# Patient Record
Sex: Female | Born: 1940 | Race: White | Hispanic: No
Health system: Southern US, Community
[De-identification: ages and names within clinical notes are randomized; demographics above are authoritative.]

## PROBLEM LIST (undated history)

## (undated) DIAGNOSIS — J984 Other disorders of lung: Secondary | ICD-10-CM

## (undated) DIAGNOSIS — Z77090 Contact with and (suspected) exposure to asbestos: Secondary | ICD-10-CM

## (undated) DIAGNOSIS — K7689 Other specified diseases of liver: Secondary | ICD-10-CM

## (undated) DIAGNOSIS — Z8679 Personal history of other diseases of the circulatory system: Secondary | ICD-10-CM

## (undated) DIAGNOSIS — K649 Unspecified hemorrhoids: Secondary | ICD-10-CM

## (undated) DIAGNOSIS — R569 Unspecified convulsions: Secondary | ICD-10-CM

## (undated) DIAGNOSIS — I700 Atherosclerosis of aorta: Secondary | ICD-10-CM

## (undated) DIAGNOSIS — I341 Nonrheumatic mitral (valve) prolapse: Secondary | ICD-10-CM

## (undated) DIAGNOSIS — T7840XA Allergy, unspecified, initial encounter: Secondary | ICD-10-CM

## (undated) DIAGNOSIS — I4510 Unspecified right bundle-branch block: Secondary | ICD-10-CM

## (undated) DIAGNOSIS — F418 Other specified anxiety disorders: Secondary | ICD-10-CM

## (undated) DIAGNOSIS — I4819 Other persistent atrial fibrillation: Secondary | ICD-10-CM

## (undated) DIAGNOSIS — K589 Irritable bowel syndrome without diarrhea: Secondary | ICD-10-CM

## (undated) DIAGNOSIS — Z9889 Other specified postprocedural states: Secondary | ICD-10-CM

## (undated) DIAGNOSIS — F419 Anxiety disorder, unspecified: Secondary | ICD-10-CM

## (undated) DIAGNOSIS — R911 Solitary pulmonary nodule: Secondary | ICD-10-CM

## (undated) DIAGNOSIS — D649 Anemia, unspecified: Secondary | ICD-10-CM

## (undated) DIAGNOSIS — D693 Immune thrombocytopenic purpura: Secondary | ICD-10-CM

## (undated) DIAGNOSIS — C4491 Basal cell carcinoma of skin, unspecified: Secondary | ICD-10-CM

## (undated) DIAGNOSIS — H269 Unspecified cataract: Secondary | ICD-10-CM

## (undated) DIAGNOSIS — Z953 Presence of xenogenic heart valve: Secondary | ICD-10-CM

## (undated) DIAGNOSIS — M1990 Unspecified osteoarthritis, unspecified site: Secondary | ICD-10-CM

## (undated) DIAGNOSIS — I340 Nonrheumatic mitral (valve) insufficiency: Secondary | ICD-10-CM

## (undated) DIAGNOSIS — I471 Supraventricular tachycardia, unspecified: Secondary | ICD-10-CM

## (undated) DIAGNOSIS — E785 Hyperlipidemia, unspecified: Secondary | ICD-10-CM

## (undated) DIAGNOSIS — I5033 Acute on chronic diastolic (congestive) heart failure: Secondary | ICD-10-CM

## (undated) DIAGNOSIS — M810 Age-related osteoporosis without current pathological fracture: Secondary | ICD-10-CM

## (undated) DIAGNOSIS — R011 Cardiac murmur, unspecified: Secondary | ICD-10-CM

## (undated) DIAGNOSIS — K5790 Diverticulosis of intestine, part unspecified, without perforation or abscess without bleeding: Secondary | ICD-10-CM

## (undated) DIAGNOSIS — G43909 Migraine, unspecified, not intractable, without status migrainosus: Secondary | ICD-10-CM

## (undated) DIAGNOSIS — G459 Transient cerebral ischemic attack, unspecified: Secondary | ICD-10-CM

## (undated) DIAGNOSIS — R001 Bradycardia, unspecified: Secondary | ICD-10-CM

## (undated) HISTORY — DX: Hyperlipidemia, unspecified: E78.5

## (undated) HISTORY — DX: Other disorders of lung: J98.4

## (undated) HISTORY — DX: Contact with and (suspected) exposure to asbestos: Z77.090

## (undated) HISTORY — PX: TEE WITH CARDIOVERSION: SHX5442

## (undated) HISTORY — DX: Unspecified right bundle-branch block: I45.10

## (undated) HISTORY — DX: Atherosclerosis of aorta: I70.0

## (undated) HISTORY — DX: Other persistent atrial fibrillation: I48.19

## (undated) HISTORY — PX: FOOT SURGERY: SHX648

## (undated) HISTORY — DX: Irritable bowel syndrome, unspecified: K58.9

## (undated) HISTORY — DX: Solitary pulmonary nodule: R91.1

## (undated) HISTORY — DX: Supraventricular tachycardia, unspecified: I47.10

## (undated) HISTORY — DX: Immune thrombocytopenic purpura: D69.3

## (undated) HISTORY — DX: Unspecified cataract: H26.9

## (undated) HISTORY — PX: BREAST EXCISIONAL BIOPSY: SUR124

## (undated) HISTORY — PX: VARICOSE VEIN SURGERY: SHX832

## (undated) HISTORY — DX: Nonrheumatic mitral (valve) prolapse: I34.1

## (undated) HISTORY — DX: Unspecified convulsions: R56.9

## (undated) HISTORY — DX: Acute on chronic diastolic (congestive) heart failure: I50.33

## (undated) HISTORY — DX: Diverticulosis of intestine, part unspecified, without perforation or abscess without bleeding: K57.90

## (undated) HISTORY — PX: TUBAL LIGATION: SHX77

## (undated) HISTORY — PX: BREAST BIOPSY: SHX20

## (undated) HISTORY — DX: Other specified anxiety disorders: F41.8

## (undated) HISTORY — DX: Other specified diseases of liver: K76.89

## (undated) HISTORY — DX: Unspecified osteoarthritis, unspecified site: M19.90

## (undated) HISTORY — DX: Migraine, unspecified, not intractable, without status migrainosus: G43.909

## (undated) HISTORY — DX: Cardiac murmur, unspecified: R01.1

## (undated) HISTORY — DX: Anemia, unspecified: D64.9

## (undated) HISTORY — DX: Supraventricular tachycardia: I47.1

## (undated) HISTORY — DX: Allergy, unspecified, initial encounter: T78.40XA

## (undated) HISTORY — DX: Transient cerebral ischemic attack, unspecified: G45.9

## (undated) HISTORY — DX: Unspecified hemorrhoids: K64.9

## (undated) HISTORY — DX: Anxiety disorder, unspecified: F41.9

---

## 1989-12-02 DIAGNOSIS — C4491 Basal cell carcinoma of skin, unspecified: Secondary | ICD-10-CM

## 1989-12-02 HISTORY — DX: Basal cell carcinoma of skin, unspecified: C44.91

## 2001-12-02 HISTORY — PX: COLONOSCOPY: SHX174

## 2002-06-25 LAB — HM COLONOSCOPY: HM Colonoscopy: NORMAL

## 2007-06-04 ENCOUNTER — Ambulatory Visit: Payer: Self-pay | Admitting: Internal Medicine

## 2008-06-28 ENCOUNTER — Ambulatory Visit: Payer: Self-pay | Admitting: Internal Medicine

## 2009-06-29 ENCOUNTER — Ambulatory Visit: Payer: Self-pay | Admitting: Internal Medicine

## 2010-07-11 ENCOUNTER — Ambulatory Visit: Payer: Self-pay | Admitting: Internal Medicine

## 2010-07-28 LAB — HM DEXA SCAN

## 2011-02-24 LAB — HM PAP SMEAR: HM Pap smear: NORMAL

## 2011-02-27 ENCOUNTER — Encounter: Payer: Self-pay | Admitting: Internal Medicine

## 2011-02-28 LAB — IFOBT (OCCULT BLOOD): IFOBT: NEGATIVE

## 2011-03-05 ENCOUNTER — Ambulatory Visit: Payer: Self-pay | Admitting: Internal Medicine

## 2011-05-01 ENCOUNTER — Encounter: Payer: Self-pay | Admitting: Cardiovascular Disease

## 2011-05-02 ENCOUNTER — Ambulatory Visit (INDEPENDENT_AMBULATORY_CARE_PROVIDER_SITE_OTHER): Payer: Medicare Other | Admitting: Cardiovascular Disease

## 2011-05-02 ENCOUNTER — Encounter: Payer: Self-pay | Admitting: Cardiovascular Disease

## 2011-05-02 DIAGNOSIS — I498 Other specified cardiac arrhythmias: Secondary | ICD-10-CM

## 2011-05-02 DIAGNOSIS — I059 Rheumatic mitral valve disease, unspecified: Secondary | ICD-10-CM

## 2011-05-02 DIAGNOSIS — E785 Hyperlipidemia, unspecified: Secondary | ICD-10-CM

## 2011-05-02 DIAGNOSIS — I341 Nonrheumatic mitral (valve) prolapse: Secondary | ICD-10-CM | POA: Insufficient documentation

## 2011-05-02 DIAGNOSIS — I471 Supraventricular tachycardia: Secondary | ICD-10-CM | POA: Insufficient documentation

## 2011-05-02 DIAGNOSIS — R002 Palpitations: Secondary | ICD-10-CM | POA: Insufficient documentation

## 2011-05-02 LAB — BASIC METABOLIC PANEL
BUN: 22 mg/dL (ref 6–23)
CO2: 30 mEq/L (ref 19–32)
Calcium: 9.4 mg/dL (ref 8.4–10.5)
Chloride: 104 mEq/L (ref 96–112)
Creatinine, Ser: 0.7 mg/dL (ref 0.4–1.2)
GFR: 89.26 mL/min (ref >60.00)
Glucose, Bld: 99 mg/dL (ref 70–99)
Potassium: 3.8 mEq/L (ref 3.5–5.1)
Sodium: 140 mEq/L (ref 135–145)

## 2011-05-02 LAB — HEPATIC FUNCTION PANEL
ALT: 18 U/L (ref 0–35)
AST: 25 U/L (ref 0–37)
Albumin: 4.2 g/dL (ref 3.5–5.2)
Alkaline Phosphatase: 65 U/L (ref 39–117)
Bilirubin, Direct: 0.1 mg/dL (ref 0.0–0.3)
Total Bilirubin: 0.7 mg/dL (ref 0.3–1.2)
Total Protein: 6.5 g/dL (ref 6.0–8.3)

## 2011-05-02 LAB — LIPID PANEL
Cholesterol: 166 mg/dL (ref 0–200)
HDL: 60 mg/dL (ref >39.00)
LDL Cholesterol: 93 mg/dL (ref 0–99)
Total CHOL/HDL Ratio: 3
Triglycerides: 63 mg/dL (ref 0.0–149.0)
VLDL: 12.6 mg/dL (ref 0.0–40.0)

## 2011-05-02 LAB — TSH: TSH: 2.5 u[IU]/mL (ref 0.35–5.50)

## 2011-05-02 MED ORDER — ROSUVASTATIN CALCIUM 5 MG PO TABS: 5.0000 mg | ORAL_TABLET | ORAL | Status: DC

## 2011-05-02 NOTE — Assessment & Plan Note (Signed)
[name] [name] has been having some more prominent heartbeats recently. She does not think that they're do to supraventricular tachycardia but thinks that she may be having another kind of heart irregularity. We'll check a TSH today as well as a lipid profile, basic metabolic profile, and hepatic profile.  I'll see her back in 3 months for followup visit.

## 2011-05-02 NOTE — Progress Notes (Signed)
[name] Date of Birth  [birth year] [facility] [address]     [address] [place], [place]  [address] [phone]  Fax  [phone]  History of Present Illness:  70 yo female with a hx of SVT, MVP and hyperlipidemia.    No major complaints.  Not having as much energy as she would like.  Working out 3 times a week.  Starting having many more palpitations.  These episodes would last hours.  TSH was checked a year ago.  Current Outpatient Prescriptions on File Prior to Visit  Medication Sig Dispense Refill  . b complex vitamins tablet Take 1 tablet by mouth daily.        . butalbital-acetaminophen-caffeine (FIORICET, ESGIC) 50-325-40 MG per tablet Take 1 tablet by mouth 2 (two) times daily as needed.        . montelukast (SINGULAIR) 10 MG tablet Take 10 mg by mouth at bedtime.        . Multiple Vitamin (MULTIVITAMIN) tablet Take 1 tablet by mouth daily.        . rosuvastatin (CRESTOR) 5 MG tablet Take 5 mg by mouth every other day.        Marland Kitchen DISCONTD: Docusate Calcium (STOOL SOFTENER PO) Take by mouth daily.          Allergies  Allergen Reactions  . Codeine   . Doxycycline   . Hydrocodone   . Mobic   . Oxycodone     Past Medical History  Diagnosis Date  . SVT (supraventricular tachycardia)   . MVP (mitral valve prolapse)   . Hyperlipidemia     Past Surgical History  Procedure Date  . Tubal ligation   . Foot surgery   . Varicose vein surgery     History  Smoking status  . Never Smoker   Smokeless tobacco  . Not on file    History  Alcohol Use No    Family History  Problem Relation Age of Onset  . Hypertension Mother   . Arrhythmia Mother   . Heart failure Mother   . Arrhythmia Brother     Reviw of Systems:  Reviewed in the HPI.  All other systems are negative.  Physical Exam: BP 110/68  Pulse 60  Ht 5\' 6"  (1.676 m)  Wt 148 lb 9.6 oz (67.405 kg)  BMI 23.98 kg/m2 The patient is alert and oriented x 3.   The mood and affect are normal.  The skin is warm and dry.  Color is normal.  The HEENT exam reveals that the sclera are nonicteric.  The mucous membranes are moist.  The carotids are 2+ without bruits.  There is no thyromegaly.  There is no JVD.  The lungs are clear.  The chest wall is non tender.  The heart exam reveals a regular rate with a normal S1 and S2.  There are no murmurs, gallops, or rubs.  The PMI is not displaced.   Abdominal exam reveals good bowel sounds.  There is no guarding or rebound.  There is no hepatosplenomegaly or tenderness.  There are no masses.  Exam of the legs reveal no clubbing, cyanosis, or edema.  The legs are without rashes.  The distal pulses are intact.  Cranial nerves II - XII are intact.  Motor and sensory functions are intact.  The gait is normal.  ECG: NSR.  RBBB  Assessment / Plan:

## 2011-05-06 NOTE — Progress Notes (Signed)
Called msg of normal results

## 2011-08-06 ENCOUNTER — Ambulatory Visit (INDEPENDENT_AMBULATORY_CARE_PROVIDER_SITE_OTHER): Payer: Medicare Other | Admitting: Cardiovascular Disease

## 2011-08-06 ENCOUNTER — Encounter: Payer: Self-pay | Admitting: Cardiovascular Disease

## 2011-08-06 VITALS — BP 122/70 | HR 60 | Ht 66.0 in | Wt 148.2 lb

## 2011-08-06 DIAGNOSIS — I341 Nonrheumatic mitral (valve) prolapse: Secondary | ICD-10-CM

## 2011-08-06 DIAGNOSIS — I498 Other specified cardiac arrhythmias: Secondary | ICD-10-CM

## 2011-08-06 DIAGNOSIS — I471 Supraventricular tachycardia: Secondary | ICD-10-CM

## 2011-08-06 DIAGNOSIS — R002 Palpitations: Secondary | ICD-10-CM

## 2011-08-06 DIAGNOSIS — E785 Hyperlipidemia, unspecified: Secondary | ICD-10-CM | POA: Insufficient documentation

## 2011-08-06 DIAGNOSIS — I059 Rheumatic mitral valve disease, unspecified: Secondary | ICD-10-CM

## 2011-08-06 NOTE — Assessment & Plan Note (Signed)
No episodes of SVT.  Continue with current meds.

## 2011-08-06 NOTE — Assessment & Plan Note (Signed)
Her initial LDL was 142.  Her lipids are now well controlled.  Continue current meds.

## 2011-08-06 NOTE — Progress Notes (Signed)
[name] [name] Date of Birth  January 07, 1941 [facility] [address]     [address] [place], [place]  [address] [phone]  Fax  [phone]  History of Present Illness:  70 year old female with a history of supraventricular tachycardia and history of much by prolapse. She has done quite well since I last saw her. She took a trip to [place] and was very dizzy. She did a lot of hiking and never had any episodes of chest pain or shortness of breath.  Current Outpatient Prescriptions on File Prior to Visit  Medication Sig Dispense Refill  . b complex vitamins tablet Take 1 tablet by mouth daily.        . butalbital-acetaminophen-caffeine (FIORICET, ESGIC) 50-325-40 MG per tablet Take 1 tablet by mouth 2 (two) times daily as needed. Takes prn      . CALCIUM PO Take 1,000 mg by mouth daily.        . Cholecalciferol (VITAMIN D) 1000 UNITS capsule Take 1,000 Units by mouth daily.        . montelukast (SINGULAIR) 10 MG tablet Take 10 mg by mouth at bedtime.        . Multiple Vitamin (MULTIVITAMIN) tablet Take 1 tablet by mouth daily.        . rosuvastatin (CRESTOR) 5 MG tablet Take 1 tablet (5 mg total) by mouth every other day.  90 tablet  3  . vitamin C (ASCORBIC ACID) 500 MG tablet Take 500 mg by mouth daily.        . vitamin E 100 UNIT capsule Take 400 Units by mouth daily.          Allergies  Allergen Reactions  . Codeine   . Doxycycline   . Hydrocodone   . Mobic   . Oxycodone     Past Medical History  Diagnosis Date  . SVT (supraventricular tachycardia)   . MVP (mitral valve prolapse)   . Hyperlipidemia     Past Surgical History  Procedure Date  . Tubal ligation   . Foot surgery   . Varicose vein surgery     History  Smoking status  . Never Smoker   Smokeless tobacco  . Not on file    History  Alcohol Use No    Family History  Problem Relation Age of Onset  . Hypertension Mother   . Arrhythmia Mother   . Heart failure  Mother   . Arrhythmia Brother     Reviw of Systems:  Reviewed in the HPI.  All other systems are negative.  Physical Exam: BP 122/70  Pulse 60  Ht 5\' 6"  (1.676 m)  Wt 148 lb 3.2 oz (67.223 kg)  BMI 23.92 kg/m2 The patient is alert and oriented x 3.  The mood and affect are normal.   Skin: warm and dry.  Color is normal.    HEENT:   the sclera are nonicteric.  The mucous membranes are moist.  The carotids are 2+ without bruits.  There is no thyromegaly.  There is no JVD.    Lungs: clear.  The chest wall is non tender.    Heart: regular rate with a normal S1 and S2.  There is a mid systolic click. The PMI is not displaced.     Abdomen: good bowel sounds.  There is no guarding or rebound.  There is no hepatosplenomegaly or tenderness.  There are no masses.   Extremities:  no clubbing, cyanosis, or edema.  The  legs are without rashes.  The distal pulses are intact.   Neuro:  Cranial nerves II - XII are intact.  Motor and sensory functions are intact.    The gait is normal.  Assessment / Plan:

## 2011-08-06 NOTE — Assessment & Plan Note (Signed)
Stable.  She remains asymptomatic.

## 2011-08-06 NOTE — Assessment & Plan Note (Signed)
Stable

## 2011-08-23 ENCOUNTER — Telehealth: Payer: Self-pay | Admitting: Internal Medicine

## 2011-09-05 ENCOUNTER — Other Ambulatory Visit: Payer: Self-pay | Admitting: Internal Medicine

## 2011-09-05 DIAGNOSIS — R51 Headache: Secondary | ICD-10-CM

## 2011-09-06 MED ORDER — BUTALBITAL-APAP-CAFFEINE 50-325-40 MG PO TABS: 1.0000 | ORAL_TABLET | Freq: Two times a day (BID) | ORAL | Status: DC | PRN

## 2011-09-06 NOTE — Progress Notes (Signed)
Addended by: Duncan Dull on: 09/06/2011 05:28 PM   Modules accepted: Orders

## 2011-09-07 ENCOUNTER — Other Ambulatory Visit: Payer: Self-pay | Admitting: Internal Medicine

## 2011-10-01 ENCOUNTER — Encounter: Payer: Self-pay | Admitting: Internal Medicine

## 2011-10-01 ENCOUNTER — Ambulatory Visit (INDEPENDENT_AMBULATORY_CARE_PROVIDER_SITE_OTHER): Payer: Medicare Other | Admitting: Internal Medicine

## 2011-10-01 DIAGNOSIS — R059 Cough, unspecified: Secondary | ICD-10-CM

## 2011-10-01 DIAGNOSIS — J040 Acute laryngitis: Secondary | ICD-10-CM

## 2011-10-01 DIAGNOSIS — R05 Cough: Secondary | ICD-10-CM

## 2011-10-01 MED ORDER — PREDNISONE (PAK) 10 MG PO TABS: ORAL_TABLET | ORAL | Status: AC

## 2011-10-01 MED ORDER — BENZONATATE 100 MG PO CAPS: 100.0000 mg | ORAL_CAPSULE | Freq: Four times a day (QID) | ORAL | Status: DC | PRN

## 2011-10-01 MED ORDER — AZITHROMYCIN 500 MG PO TABS: 500.0000 mg | ORAL_TABLET | Freq: Every day | ORAL | Status: AC

## 2011-10-01 NOTE — Progress Notes (Signed)
Subjective:    Patient ID: [name] [name], female    DOB: [birth year], 70 y.o.   MRN: [id number]  HPI  70 yo femalewith historyof chronic headaches, SVT and mitral valve prolapse  presents with laryngitisof 2 days duration  which followed an URI which she contracted after caring for her daughter who had pneumonia and bronchitis. She had several days of cough productive of clear occsaional green sputum, headache and rhinitis with post nasal drip but no but no fevers.  She is reqesting treatment is she has a vacation to [place] planned at the end of the week.  Has been taking otc mucinex and sudafed pe for the congestion.   Past Medical History  Diagnosis Date  . SVT (supraventricular tachycardia)   . MVP (mitral valve prolapse)   . Hyperlipidemia    Current Outpatient Prescriptions on File Prior to Visit  Medication Sig Dispense Refill  . aspirin-acetaminophen-caffeine (EXCEDRIN MIGRAINE) 250-250-65 MG per tablet Take 1 tablet by mouth every 6 (six) hours as needed.        Marland Kitchen b complex vitamins tablet Take 1 tablet by mouth daily.        . butalbital-acetaminophen-caffeine (FIORICET, ESGIC) 50-325-40 MG per tablet TAKE 1 TABLET AS NEEDED FOR BACK PAIN OR HEADACHE  90 tablet  2  . CALCIUM PO Take 1,000 mg by mouth daily.        . Cholecalciferol (VITAMIN D) 1000 UNITS capsule Take 1,000 Units by mouth daily.        . Flaxseed, Linseed, (FLAX SEED OIL) 1000 MG CAPS Take by mouth daily.        . Ibuprofen (MOTRIN PO) Take by mouth as needed.        . montelukast (SINGULAIR) 10 MG tablet Take 10 mg by mouth at bedtime.        . Multiple Vitamin (MULTIVITAMIN) tablet Take 1 tablet by mouth daily.        . Omega-3 Fatty Acids (FISH OIL PO) Take by mouth daily.        . Probiotic Product (PROBIOTIC PO) Take by mouth daily.        . rosuvastatin (CRESTOR) 5 MG tablet Take 1 tablet (5 mg total) by mouth every other day.  90 tablet  3  . vitamin C (ASCORBIC ACID) 500 MG tablet Take 500 mg by mouth  daily.        . vitamin E 100 UNIT capsule Take 400 Units by mouth daily.          Review of Systems  Constitutional: Negative for fever, chills and unexpected weight change.  HENT: Positive for congestion, rhinorrhea, voice change, postnasal drip and sinus pressure. Negative for hearing loss, ear pain, nosebleeds, sore throat, facial swelling, sneezing, mouth sores, trouble swallowing, neck pain, neck stiffness, tinnitus and ear discharge.   Eyes: Negative for pain, discharge, redness and visual disturbance.  Respiratory: Positive for cough. Negative for chest tightness, shortness of breath, wheezing and stridor.   Cardiovascular: Negative for chest pain, palpitations and leg swelling.  Musculoskeletal: Negative for myalgias and arthralgias.  Skin: Negative for color change and rash.  Neurological: Negative for dizziness, weakness, light-headedness and headaches.  Hematological: Negative for adenopathy.      BP 130/76  Pulse 68  Temp(Src) 97.8 F (36.6 C) (Oral)  Resp 16  Ht 5' 6.5" (1.689 m)  Wt 146 lb (66.225 kg)  BMI 23.21 kg/m2  SpO2 99%  Objective:   Physical Exam  Constitutional: She is oriented  to person, place, and time. She appears well-developed and well-nourished.  HENT:  Right Ear: Tympanic membrane is scarred.  Left Ear: Tympanic membrane is scarred.  Nose: Mucosal edema present.  Mouth/Throat: Mucous membranes are normal. No uvula swelling. Posterior oropharyngeal erythema present. No oropharyngeal exudate or tonsillar abscesses.  Eyes: EOM are normal. Pupils are equal, round, and reactive to light. No scleral icterus.  Neck: Normal range of motion. Neck supple. No JVD present. No thyromegaly present.  Cardiovascular: Normal rate, regular rhythm, normal heart sounds and intact distal pulses.   Pulmonary/Chest: Effort normal and breath sounds normal.  Abdominal: Soft. Bowel sounds are normal. She exhibits no mass. There is no tenderness.  Musculoskeletal: Normal  range of motion. She exhibits no edema.  Lymphadenopathy:    She has no cervical adenopathy.  Neurological: She is alert and oriented to person, place, and time.  Skin: Skin is warm and dry.  Psychiatric: She has a normal mood and affect.          Assessment & Plan:  Laryngitis:  secodnaty to recent URI .  Voice rest,  Cough suppressant ,  Prednisone.  Add azithromycin if sinusitis or otitis symptoms develop while away.

## 2011-10-01 NOTE — Patient Instructions (Signed)
The  best cure for laryngitis  Is voice rest, lots of fluids, and cough suppression.  Take the tessalon every 6 hours for cough,  The prednisone 6 day taper to decrease the inflammation,    And add the azithromycin if you develop a temperature over 100.5 or signs of a sinus infection (greenish or brown nasal discharge or cough )

## 2011-10-02 ENCOUNTER — Encounter: Payer: Self-pay | Admitting: Internal Medicine

## 2011-11-26 LAB — HM MAMMOGRAPHY: HM Mammogram: NORMAL

## 2011-12-02 ENCOUNTER — Ambulatory Visit: Payer: Self-pay | Admitting: Internal Medicine

## 2011-12-03 HISTORY — PX: EYE SURGERY: SHX253

## 2011-12-18 ENCOUNTER — Encounter: Payer: Self-pay | Admitting: Internal Medicine

## 2012-01-31 NOTE — Telephone Encounter (Signed)
This was

## 2012-05-04 ENCOUNTER — Telehealth: Payer: Self-pay | Admitting: Cardiovascular Disease

## 2012-05-04 NOTE — Telephone Encounter (Signed)
Pt needs appt with Dr. Elease Hashimoto for pre op eval. Thanks surg is not until july

## 2012-05-04 NOTE — Telephone Encounter (Signed)
New problem:  Patient calling having eye surgery @ [facility]. 7-9-. Pre -op June 26. Need cardiac clearance.

## 2012-05-04 NOTE — Telephone Encounter (Signed)
FORWARD TO [facility] OFFICE

## 2012-05-05 ENCOUNTER — Encounter: Payer: Self-pay | Admitting: Cardiovascular Disease

## 2012-05-05 ENCOUNTER — Ambulatory Visit (INDEPENDENT_AMBULATORY_CARE_PROVIDER_SITE_OTHER): Payer: Medicare Other | Admitting: Cardiovascular Disease

## 2012-05-05 VITALS — BP 120/70 | HR 59 | Ht 66.5 in | Wt 146.5 lb

## 2012-05-05 DIAGNOSIS — I4510 Unspecified right bundle-branch block: Secondary | ICD-10-CM | POA: Insufficient documentation

## 2012-05-05 DIAGNOSIS — I498 Other specified cardiac arrhythmias: Secondary | ICD-10-CM

## 2012-05-05 DIAGNOSIS — R002 Palpitations: Secondary | ICD-10-CM

## 2012-05-05 DIAGNOSIS — I471 Supraventricular tachycardia: Secondary | ICD-10-CM

## 2012-05-05 DIAGNOSIS — I341 Nonrheumatic mitral (valve) prolapse: Secondary | ICD-10-CM

## 2012-05-05 DIAGNOSIS — I059 Rheumatic mitral valve disease, unspecified: Secondary | ICD-10-CM

## 2012-05-05 DIAGNOSIS — E785 Hyperlipidemia, unspecified: Secondary | ICD-10-CM

## 2012-05-05 NOTE — Progress Notes (Signed)
[name] [name] Date of Birth  August 11, 1941       [facility] Office [address]  [address], [address] [place], [place]  [address]   [place], [place]  [address] [phone]     [phone]   Fax  [phone]    Fax [phone]  Problem List: 1. Supraventricular tachycardia 2. Mitral valve 3. Cataracts  History of Present Illness:  [name] [name] is a  71 yo with hx of SVT and MVP.  She needs to have cataract surgery and is hear for pre op evaluation.  She has done well.  She has not been exercising as much as she would like.  She has not had any syncope or presyncope.  She has been taking care of her granddaughter who has ADD and a sleep disorder.  [name] [name] did not get much sleep that weekend and had lots of palpitations for the following week  Current Outpatient Prescriptions on File Prior to Visit  Medication Sig Dispense Refill  . aspirin-acetaminophen-caffeine (EXCEDRIN MIGRAINE) 250-250-65 MG per tablet Take 1 tablet by mouth every 6 (six) hours as needed.        Marland Kitchen b complex vitamins tablet Take 1 tablet by mouth daily.        . benzonatate (TESSALON PERLES) 100 MG capsule Take 1 capsule (100 mg total) by mouth every 6 (six) hours as needed for cough.  60 capsule  0  . butalbital-acetaminophen-caffeine (FIORICET, ESGIC) 50-325-40 MG per tablet TAKE 1 TABLET AS NEEDED FOR BACK PAIN OR HEADACHE  90 tablet  2  . CALCIUM PO Take 1,000 mg by mouth daily.        . Cholecalciferol (VITAMIN D) 1000 UNITS capsule Take 1,000 Units by mouth daily.        . Flaxseed, Linseed, (FLAX SEED OIL) 1000 MG CAPS Take by mouth daily.        . Ibuprofen (MOTRIN PO) Take by mouth as needed.        . montelukast (SINGULAIR) 10 MG tablet Take 10 mg by mouth at bedtime.        . Multiple Vitamin (MULTIVITAMIN) tablet Take 1 tablet by mouth daily.        . Omega-3 Fatty Acids (FISH OIL PO) Take by mouth daily.        . Probiotic Product (PROBIOTIC PO) Take by mouth  daily.        . rosuvastatin (CRESTOR) 5 MG tablet Take 1 tablet (5 mg total) by mouth every other day.  90 tablet  3  . vitamin C (ASCORBIC ACID) 500 MG tablet Take 500 mg by mouth daily.        . vitamin E 100 UNIT capsule Take 400 Units by mouth daily.          Allergies  Allergen Reactions  . Codeine   . Doxycycline   . Hydrocodone   . Meloxicam   . Oxycodone     Past Medical History  Diagnosis Date  . SVT (supraventricular tachycardia)   . MVP (mitral valve prolapse)   . Hyperlipidemia     Past Surgical History  Procedure Date  . Tubal ligation   . Foot surgery   . Varicose vein surgery     History  Smoking status  . Never Smoker   Smokeless tobacco  . Never Used    History  Alcohol Use  . 1.5 oz/week  . 3 drink(s) per week  Family History  Problem Relation Age of Onset  . Hypertension Mother   . Arrhythmia Mother   . Heart failure Mother   . Arrhythmia Brother     Reviw of Systems:  Reviewed in the HPI.  All other systems are negative.  Physical Exam: Blood pressure 120/70, pulse 59, height 5' 6.5" (1.689 m), weight 146 lb 8 oz (66.452 kg). General: Well developed, well nourished, in no acute distress.  Head: Normocephalic, atraumatic, sclera non-icteric, mucus membranes are moist,   Neck: Supple. Carotids are 2 + without bruits. No JVD  Lungs: Clear bilaterally to auscultation.  Heart: regular rate.  normal  S1 S2. She has a midsystolic click. There is a 2/6 systolic ejection murmur at the axilla.  Abdomen: Soft, non-tender, non-distended with normal bowel sounds. No hepatomegaly. No rebound/guarding. No masses.  Msk:  Strength and tone are normal  Extremities: No clubbing or cyanosis. No edema.  Distal pedal pulses are 2+ and equal bilaterally.  Neuro: Alert and oriented X 3. Moves all extremities spontaneously.  Psych:  Responds to questions appropriately with a normal affect.  ECG: 05/05/2012 --sinus bradycardia at 59 beats a  minute. She has a right bundle branch block. EKG is unchanged from her previous tracings.  Assessment / Plan:

## 2012-05-05 NOTE — Assessment & Plan Note (Signed)
[name] [name] is doing very well. She has a stable mitral valve prolapse. She does not have any other symptoms. Will see her again in one year for followup visit and EKG.

## 2012-05-05 NOTE — Assessment & Plan Note (Signed)
We will have her return tomorrow for fasting labs.

## 2012-05-05 NOTE — Patient Instructions (Signed)
Your physician wants you to follow-up in: 1 year with Dr. Elease Hashimoto. You will receive a reminder letter in the mail two months in advance. If you don't receive a letter, please call our office to schedule the follow-up appointment.  Your physician recommends that you return for lab work tomorrow.  Make sure you are fasting.

## 2012-05-06 ENCOUNTER — Other Ambulatory Visit (INDEPENDENT_AMBULATORY_CARE_PROVIDER_SITE_OTHER): Payer: Medicare Other

## 2012-05-06 DIAGNOSIS — R002 Palpitations: Secondary | ICD-10-CM

## 2012-05-06 DIAGNOSIS — E785 Hyperlipidemia, unspecified: Secondary | ICD-10-CM

## 2012-05-06 DIAGNOSIS — I471 Supraventricular tachycardia: Secondary | ICD-10-CM

## 2012-05-07 LAB — BASIC METABOLIC PANEL
BUN/Creatinine Ratio: 25 (ref 11–26)
BUN: 18 mg/dL (ref 8–27)
CO2: 26 mmol/L (ref 20–32)
Calcium: 9.1 mg/dL (ref 8.6–10.2)
Chloride: 105 mmol/L (ref 97–108)
Creatinine, Ser: 0.73 mg/dL (ref 0.57–1.00)
GFR calc Af Amer: 96 mL/min/{1.73_m2} (ref >59)
GFR calc non Af Amer: 83 mL/min/{1.73_m2} (ref >59)
Glucose: 92 mg/dL (ref 65–99)
Potassium: 4.8 mmol/L (ref 3.5–5.2)
Sodium: 141 mmol/L (ref 134–144)

## 2012-05-07 LAB — HEPATIC FUNCTION PANEL
ALT: 19 IU/L (ref 0–32)
AST: 25 IU/L (ref 0–40)
Albumin: 4.2 g/dL (ref 3.5–4.8)
Alkaline Phosphatase: 72 IU/L (ref 25–165)
Bilirubin, Direct: 0.13 mg/dL (ref 0.00–0.40)
Total Bilirubin: 0.5 mg/dL (ref 0.0–1.2)
Total Protein: 6.2 g/dL (ref 6.0–8.5)

## 2012-05-07 LAB — CHOLESTEROL, TOTAL: Cholesterol, Total: 176 mg/dL (ref 100–199)

## 2012-05-11 ENCOUNTER — Telehealth: Payer: Self-pay | Admitting: Cardiovascular Disease

## 2012-05-11 NOTE — Telephone Encounter (Signed)
Patient returning nurse call concerning results.  Patient has question about lab results, she can be reached at [phone].

## 2012-05-11 NOTE — Telephone Encounter (Signed)
She states she would like to get the labs done at [facility] next Tuesday, 6/10.

## 2012-05-11 NOTE — Telephone Encounter (Signed)
Pt is concerned because all she was given at the [facility] office is her total cholesterol.  She wants to know if she can have it rechecked with the LDL, triglycerides and the HDL along with the total.  She would like to have it reordered with these results.

## 2012-05-14 ENCOUNTER — Other Ambulatory Visit: Payer: Self-pay

## 2012-05-14 DIAGNOSIS — E785 Hyperlipidemia, unspecified: Secondary | ICD-10-CM

## 2012-05-14 NOTE — Telephone Encounter (Signed)
Pt called back. Scheduled for L/L at [facility] office Tues 6/18 at 0830

## 2012-05-14 NOTE — Telephone Encounter (Signed)
LMTCB

## 2012-05-14 NOTE — Telephone Encounter (Signed)
Yes, she should have the whole lipid profile.

## 2012-05-14 NOTE — Telephone Encounter (Signed)
Will forward to [place] office to order and draw lab per Dr Elease Hashimoto

## 2012-05-19 ENCOUNTER — Ambulatory Visit (INDEPENDENT_AMBULATORY_CARE_PROVIDER_SITE_OTHER): Payer: Medicare Other

## 2012-05-19 DIAGNOSIS — E785 Hyperlipidemia, unspecified: Secondary | ICD-10-CM

## 2012-05-20 LAB — BASIC METABOLIC PANEL
BUN/Creatinine Ratio: 32 — ABNORMAL HIGH (ref 11–26)
BUN: 25 mg/dL (ref 8–27)
CO2: 25 mmol/L (ref 19–28)
Calcium: 9 mg/dL (ref 8.6–10.2)
Chloride: 101 mmol/L (ref 97–108)
Creatinine, Ser: 0.79 mg/dL (ref 0.57–1.00)
GFR calc Af Amer: 87 mL/min/{1.73_m2} (ref >59)
GFR calc non Af Amer: 76 mL/min/{1.73_m2} (ref >59)
Glucose: 88 mg/dL (ref 65–99)
Potassium: 5 mmol/L (ref 3.5–5.2)
Sodium: 137 mmol/L (ref 134–144)

## 2012-05-20 LAB — LIPID PANEL
Chol/HDL Ratio: 2.6 ratio units (ref 0.0–4.4)
Cholesterol, Total: 171 mg/dL (ref 100–199)
HDL: 66 mg/dL (ref >39)
LDL Calculated: 94 mg/dL (ref 0–99)
Triglycerides: 56 mg/dL (ref 0–149)
VLDL Cholesterol Cal: 11 mg/dL (ref 5–40)

## 2012-05-20 LAB — HEPATIC FUNCTION PANEL
ALT: 18 IU/L (ref 0–32)
AST: 24 IU/L (ref 0–40)
Albumin: 3.9 g/dL (ref 3.5–4.8)
Alkaline Phosphatase: 67 IU/L (ref 25–165)
Bilirubin, Direct: 0.16 mg/dL (ref 0.00–0.40)
Total Bilirubin: 0.5 mg/dL (ref 0.0–1.2)
Total Protein: 5.9 g/dL — ABNORMAL LOW (ref 6.0–8.5)

## 2012-06-09 ENCOUNTER — Ambulatory Visit: Payer: Self-pay | Admitting: Ophthalmology

## 2012-06-25 ENCOUNTER — Ambulatory Visit (INDEPENDENT_AMBULATORY_CARE_PROVIDER_SITE_OTHER): Payer: Medicare Other | Admitting: Internal Medicine

## 2012-06-25 ENCOUNTER — Ambulatory Visit (INDEPENDENT_AMBULATORY_CARE_PROVIDER_SITE_OTHER)
Admission: RE | Admit: 2012-06-25 | Discharge: 2012-06-25 | Disposition: A | Discharge status: Home / Self Care | Payer: Medicare Other | Source: Ambulatory Visit | Attending: Internal Medicine | Admitting: Internal Medicine

## 2012-06-25 ENCOUNTER — Encounter: Payer: Self-pay | Admitting: Internal Medicine

## 2012-06-25 VITALS — BP 120/70 | HR 60 | Temp 97.8°F | Resp 16 | Wt 145.5 lb

## 2012-06-25 DIAGNOSIS — Z8739 Personal history of other diseases of the musculoskeletal system and connective tissue: Secondary | ICD-10-CM

## 2012-06-25 DIAGNOSIS — Z23 Encounter for immunization: Secondary | ICD-10-CM

## 2012-06-25 DIAGNOSIS — Z77090 Contact with and (suspected) exposure to asbestos: Secondary | ICD-10-CM

## 2012-06-25 DIAGNOSIS — Z9849 Cataract extraction status, unspecified eye: Secondary | ICD-10-CM

## 2012-06-25 DIAGNOSIS — F418 Other specified anxiety disorders: Secondary | ICD-10-CM | POA: Insufficient documentation

## 2012-06-25 DIAGNOSIS — E785 Hyperlipidemia, unspecified: Secondary | ICD-10-CM

## 2012-06-25 DIAGNOSIS — M8580 Other specified disorders of bone density and structure, unspecified site: Secondary | ICD-10-CM

## 2012-06-25 DIAGNOSIS — F341 Dysthymic disorder: Secondary | ICD-10-CM

## 2012-06-25 DIAGNOSIS — I4510 Unspecified right bundle-branch block: Secondary | ICD-10-CM

## 2012-06-25 DIAGNOSIS — M899 Disorder of bone, unspecified: Secondary | ICD-10-CM

## 2012-06-25 DIAGNOSIS — Z9841 Cataract extraction status, right eye: Secondary | ICD-10-CM | POA: Insufficient documentation

## 2012-06-25 DIAGNOSIS — M949 Disorder of cartilage, unspecified: Secondary | ICD-10-CM

## 2012-06-25 DIAGNOSIS — R002 Palpitations: Secondary | ICD-10-CM

## 2012-06-25 MED ORDER — ALPRAZOLAM 0.25 MG PO TBDP: 0.2500 mg | ORAL_TABLET | Freq: Every evening | ORAL | Status: DC | PRN

## 2012-06-25 NOTE — Progress Notes (Signed)
Patient ID: [name] [name], female   DOB: Mar 28, 1941, 71 y.o.   MRN: [id number]  Patient Active Problem List  Diagnosis  . Mitral valve prolapse  . Palpitations  . Supraventricular tachycardia  . Hyperlipidemia  . RBBB  . Anxiety associated with depression  . Cataract extraction status of right eye  . History of asbestos exposure  . Osteopenia    Subjective:  CC:   Chief Complaint  Patient presents with  . Medication Refill  . Follow-up    HPI:   [name] [name] a 71 y.o. female who presents 6 month follow up on acute and chronic problems including anxiety/depression and osteopenia.  She underwent Cataract surgery on her right eye 2 weeks ago which failed due to rotation of the lens implant. Repeat is planned for next week by  Dr. Druscilla Brownie .  She is experiencing a lot of anxiety bujt is only allowing herself  to use the alprazolam once or twice a month.  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  4) She has a history of osteopenia and is due for a DEXA.  There is a FH of osteoporosis.  She is requesting a pneumovax.    Past Medical History  Diagnosis Date  . SVT (supraventricular tachycardia)   . MVP (mitral valve prolapse)   . Hyperlipidemia     Past Surgical History  Procedure Date  . Tubal ligation   . Foot surgery   . Varicose vein surgery          The following portions of the patient's history were reviewed and updated as appropriate: Allergies, current medications, and problem list.    Review of Systems:   Review of Systems  Constitutional: Negative.   HENT: Negative.   Eyes: Positive for blurred vision.  Respiratory: Negative.   Cardiovascular: Positive for palpitations.  Negative for chest pain and leg swelling.  Gastrointestinal: Negative for nausea, abdominal pain and constipation.  Genitourinary: Negative.   Musculoskeletal: Negative.   Skin: Negative.   Neurological: Negative for dizziness, sensory change and focal weakness.  Endo/Heme/Allergies: Does not bruise/bleed easily.  Psychiatric/Behavioral: Negative for suicidal ideas and substance abuse. The patient is nervous/anxious.        History   Social History  . Marital Status: Married    Spouse Name: Deceased    Number of Children: N/A  . Years of Education: 16   Occupational History  . Not on file.   Social History Main Topics  . Smoking status: Never Smoker   . Smokeless tobacco: Never Used  . Alcohol Use: 1.5 oz/week    3 drink(s) per week  . Drug Use: No  . Sexually Active: No   Other Topics Concern  . Not on file   Social History Narrative   She is a widow.  Husband died last year from metastatic renal cell cancer    Objective:  BP 120/70  Pulse 60  Temp 97.8 F (36.6 C) (Oral)  Resp 16  Wt 145 lb 8 oz (65.998 kg)  SpO2 97%  General appearance: alert, cooperative and appears stated age Ears: normal TM's and external ear canals both ears Throat: lips, mucosa, and tongue normal; teeth and  gums normal Neck: no adenopathy, no carotid bruit, supple, symmetrical, trachea midline and thyroid not enlarged, symmetric, no tenderness/mass/nodules Back: symmetric, no curvature. ROM normal. No CVA tenderness. Lungs: clear to auscultation bilaterally Heart: regular rate and rhythm, S1, S2 normal, no murmur, click, rub or gallop Abdomen: soft, non-tender; bowel sounds normal; no masses,  no organomegaly Pulses: 2+ and symmetric Skin: Skin color, texture, turgor normal. No rashes or lesions Lymph nodes: Cervical, supraclavicular, and axillary nodes normal.  Assessment and Plan:  Hyperlipidemia well controlled on crestor 5 mg qod  LDL < 100  History of asbestos  exposure She is asymptomatic and her screening chest x ray was normal .   Anxiety associated with depression Aggravated by recent cataract surgery failure.  Discussed her infrequent use of alprazolam and urged her to use it to manage her symptoms short term   RBBB Recent evaluation by Delane Ginger for preoperative evaluation was normal.   Palpitations With history of SVT, Mitral valve prolapse, anxiety,  Aggravated by recent episodes of sleeplessness due to care of granddaughter, now resolved.   Osteopenia Stable by last DEX August 2011, with actual improvement in  T scores  Except at L Hip , T score -1.7.  Repeat due this year.    Updated Medication List Outpatient Encounter Prescriptions as of 06/25/2012  Medication Sig Dispense Refill  . ALPRAZolam (NIRAVAM) 0.25 MG dissolvable tablet Take 1 tablet (0.25 mg total) by mouth at bedtime as needed.  30 tablet  2  . aspirin-acetaminophen-caffeine (EXCEDRIN MIGRAINE) 250-250-65 MG per tablet Take 1 tablet by mouth every 6 (six) hours as needed.        Marland Kitchen b complex vitamins tablet Take 1 tablet by mouth daily.        . butalbital-acetaminophen-caffeine (FIORICET, ESGIC) 50-325-40 MG per tablet TAKE 1 TABLET AS NEEDED FOR BACK PAIN OR HEADACHE  90 tablet  2  . CALCIUM PO Take 1,000 mg by mouth daily.        . Cholecalciferol (VITAMIN D) 1000 UNITS capsule Take 1,000 Units by mouth daily.        . Flaxseed, Linseed, (FLAX SEED OIL) 1000 MG CAPS Take by mouth daily.        . Ibuprofen (MOTRIN PO) Take by mouth as needed.        . montelukast (SINGULAIR) 10 MG tablet Take 10 mg by mouth at bedtime.        . Multiple Vitamin (MULTIVITAMIN) tablet Take 1 tablet by mouth daily.        . Omega-3 Fatty Acids (FISH OIL PO) Take by mouth daily.        . Probiotic Product (PROBIOTIC PO) Take by mouth daily.        . rosuvastatin (CRESTOR) 5 MG tablet Take 1 tablet (5 mg total) by mouth every other day.  90 tablet  3  . vitamin C (ASCORBIC ACID) 500 MG  tablet Take 500 mg by mouth daily.        . vitamin E 100 UNIT capsule Take 400 Units by mouth daily.        Marland Kitchen DISCONTD: ALPRAZolam (NIRAVAM) 0.25 MG dissolvable tablet Take 0.25 mg by mouth at bedtime as needed.      Marland Kitchen DISCONTD: benzonatate (TESSALON PERLES) 100 MG capsule Take 1 capsule (100 mg total) by mouth every 6 (six) hours as needed for cough.  60 capsule  0     Orders Placed This Encounter  Procedures  . DG Bone Density  .  HM MAMMOGRAPHY  . DG Chest 2 View  . HM DEXA SCAN  . Tdap vaccine greater than or equal to 7yo IM  . Pneumococcal polysaccharide vaccine 23-valent greater than or equal to 2yo subcutaneous/IM  . HM PAP SMEAR  . HM COLONOSCOPY    No Follow-up on file.

## 2012-06-25 NOTE — Patient Instructions (Addendum)
I have ordered your chest x ray to be done at [facility] to be done at [facility] in September   Take home stool test for occult blood ( in lieu of colonoscopy)

## 2012-06-25 NOTE — Assessment & Plan Note (Signed)
well controlled on crestor 5 mg qod  LDL < 100

## 2012-06-27 DIAGNOSIS — M810 Age-related osteoporosis without current pathological fracture: Secondary | ICD-10-CM | POA: Insufficient documentation

## 2012-06-27 DIAGNOSIS — Z77090 Contact with and (suspected) exposure to asbestos: Secondary | ICD-10-CM | POA: Insufficient documentation

## 2012-06-27 NOTE — Assessment & Plan Note (Signed)
With history of SVT, Mitral valve prolapse, anxiety,  Aggravated by recent episodes of sleeplessness due to care of granddaughter, now resolved.

## 2012-06-27 NOTE — Assessment & Plan Note (Signed)
Recent evaluation by Delane Ginger for preoperative evaluation was normal.

## 2012-06-27 NOTE — Assessment & Plan Note (Signed)
She is asymptomatic and her screening chest x ray was normal .

## 2012-06-27 NOTE — Assessment & Plan Note (Signed)
Aggravated by recent cataract surgery failure.  Discussed her infrequent use of alprazolam and urged her to use it to manage her symptoms short term

## 2012-06-27 NOTE — Assessment & Plan Note (Signed)
Stable by last DEX August 2011, with actual improvement in  T scores  Except at L Hip , T score -1.7.  Repeat due this year.

## 2012-06-29 ENCOUNTER — Ambulatory Visit: Payer: Self-pay | Admitting: Ophthalmology

## 2012-07-09 ENCOUNTER — Other Ambulatory Visit: Payer: Self-pay | Admitting: Internal Medicine

## 2012-07-09 ENCOUNTER — Other Ambulatory Visit: Payer: Medicare Other

## 2012-07-09 DIAGNOSIS — Z1211 Encounter for screening for malignant neoplasm of colon: Secondary | ICD-10-CM

## 2012-07-09 LAB — FECAL OCCULT BLOOD, IMMUNOCHEMICAL: Fecal Occult Bld: NEGATIVE

## 2012-07-13 ENCOUNTER — Telehealth: Payer: Self-pay | Admitting: Internal Medicine

## 2012-07-13 NOTE — Telephone Encounter (Signed)
°  pt called about her bone density  she cannot go weds am or no fridays vac 1st week sept any time after 08/10/12 excepts for wed am and no Friday [facility]

## 2012-07-16 NOTE — Telephone Encounter (Signed)
Patient is aware of appointment scheduled with [place] on 9.11.13 @ 1:00.

## 2012-07-23 ENCOUNTER — Other Ambulatory Visit: Payer: Self-pay | Admitting: Cardiovascular Disease

## 2012-07-23 MED ORDER — ROSUVASTATIN CALCIUM 5 MG PO TABS: 5.0000 mg | ORAL_TABLET | ORAL | Status: DC

## 2012-07-30 ENCOUNTER — Encounter: Payer: Self-pay | Admitting: Internal Medicine

## 2012-08-12 ENCOUNTER — Ambulatory Visit: Payer: Self-pay | Admitting: Internal Medicine

## 2012-08-13 ENCOUNTER — Telehealth: Payer: Self-pay | Admitting: Internal Medicine

## 2012-08-13 NOTE — Telephone Encounter (Signed)
Her bone density test revealed stable or improved is worse compared to 2011 per square remain in the mild osteopenia range except for her left forearm which has always been more osteopenic than the other bones. I would recommend continued calcium and vitamin D and add weight training with low weights to do biceps and triceps curls and shoulder presses. These should improve the strength in her upper body and improvement bone density in her arms.

## 2012-08-14 NOTE — Telephone Encounter (Signed)
Patient is aware of bone density result

## 2012-09-10 ENCOUNTER — Encounter: Payer: Self-pay | Admitting: Internal Medicine

## 2012-12-24 ENCOUNTER — Other Ambulatory Visit: Payer: Self-pay | Admitting: Internal Medicine

## 2012-12-30 ENCOUNTER — Ambulatory Visit: Payer: Medicare Other | Admitting: Internal Medicine

## 2012-12-31 ENCOUNTER — Ambulatory Visit (INDEPENDENT_AMBULATORY_CARE_PROVIDER_SITE_OTHER): Payer: Medicare Other | Admitting: Internal Medicine

## 2012-12-31 ENCOUNTER — Ambulatory Visit: Payer: Medicare Other | Admitting: Internal Medicine

## 2012-12-31 ENCOUNTER — Encounter: Payer: Self-pay | Admitting: Internal Medicine

## 2012-12-31 VITALS — BP 112/80 | HR 64 | Temp 97.9°F | Resp 16 | Ht 66.5 in | Wt 145.0 lb

## 2012-12-31 DIAGNOSIS — I4510 Unspecified right bundle-branch block: Secondary | ICD-10-CM

## 2012-12-31 DIAGNOSIS — E785 Hyperlipidemia, unspecified: Secondary | ICD-10-CM

## 2012-12-31 DIAGNOSIS — R6889 Other general symptoms and signs: Secondary | ICD-10-CM

## 2012-12-31 DIAGNOSIS — R5381 Other malaise: Secondary | ICD-10-CM

## 2012-12-31 DIAGNOSIS — K589 Irritable bowel syndrome without diarrhea: Secondary | ICD-10-CM

## 2012-12-31 DIAGNOSIS — Z1239 Encounter for other screening for malignant neoplasm of breast: Secondary | ICD-10-CM

## 2012-12-31 DIAGNOSIS — D696 Thrombocytopenia, unspecified: Secondary | ICD-10-CM

## 2012-12-31 DIAGNOSIS — R002 Palpitations: Secondary | ICD-10-CM

## 2012-12-31 DIAGNOSIS — Z13220 Encounter for screening for lipoid disorders: Secondary | ICD-10-CM

## 2012-12-31 DIAGNOSIS — R5383 Other fatigue: Secondary | ICD-10-CM

## 2012-12-31 LAB — COMPREHENSIVE METABOLIC PANEL
ALT: 18 U/L (ref 0–35)
AST: 26 U/L (ref 0–37)
Albumin: 4.2 g/dL (ref 3.5–5.2)
Alkaline Phosphatase: 66 U/L (ref 39–117)
BUN: 25 mg/dL — ABNORMAL HIGH (ref 6–23)
CO2: 29 mEq/L (ref 19–32)
Calcium: 9.5 mg/dL (ref 8.4–10.5)
Chloride: 103 mEq/L (ref 96–112)
Creatinine, Ser: 0.8 mg/dL (ref 0.4–1.2)
GFR: 70.8 mL/min (ref >60.00)
Glucose, Bld: 92 mg/dL (ref 70–99)
Potassium: 3.9 mEq/L (ref 3.5–5.1)
Sodium: 139 mEq/L (ref 135–145)
Total Bilirubin: 0.8 mg/dL (ref 0.3–1.2)
Total Protein: 6.6 g/dL (ref 6.0–8.3)

## 2012-12-31 LAB — CBC WITH DIFFERENTIAL/PLATELET
Basophils Absolute: 0 10*3/uL (ref 0.0–0.1)
Basophils Relative: 0.5 % (ref 0.0–3.0)
Eosinophils Absolute: 0.2 10*3/uL (ref 0.0–0.7)
Eosinophils Relative: 4.1 % (ref 0.0–5.0)
HCT: 39.7 % (ref 36.0–46.0)
Hemoglobin: 13.3 g/dL (ref 12.0–15.0)
Lymphocytes Relative: 36.5 % (ref 12.0–46.0)
Lymphs Abs: 1.9 10*3/uL (ref 0.7–4.0)
MCHC: 33.5 g/dL (ref 30.0–36.0)
MCV: 89.9 fl (ref 78.0–100.0)
Monocytes Absolute: 0.4 10*3/uL (ref 0.1–1.0)
Monocytes Relative: 7.2 % (ref 3.0–12.0)
Neutro Abs: 2.7 10*3/uL (ref 1.4–7.7)
Neutrophils Relative %: 51.7 % (ref 43.0–77.0)
Platelets: 132 10*3/uL — ABNORMAL LOW (ref 150.0–400.0)
RBC: 4.42 Mil/uL (ref 3.87–5.11)
RDW: 13.1 % (ref 11.5–14.6)
WBC: 5.2 10*3/uL (ref 4.5–10.5)

## 2012-12-31 LAB — LIPID PANEL
Cholesterol: 168 mg/dL (ref 0–200)
HDL: 59.4 mg/dL (ref >39.00)
LDL Cholesterol: 97 mg/dL (ref 0–99)
Total CHOL/HDL Ratio: 3
Triglycerides: 59 mg/dL (ref 0.0–149.0)
VLDL: 11.8 mg/dL (ref 0.0–40.0)

## 2012-12-31 LAB — TSH: TSH: 1.58 u[IU]/mL (ref 0.35–5.50)

## 2012-12-31 MED ORDER — LACTULOSE 20 GM/30ML PO SOLN: 30.0000 mL | ORAL | Status: DC | PRN

## 2012-12-31 MED ORDER — ALPRAZOLAM 0.25 MG PO TABS: 0.2500 mg | ORAL_TABLET | Freq: Every evening | ORAL | Status: DC | PRN

## 2012-12-31 MED ORDER — DIAZEPAM 5 MG PO TABS: 5.0000 mg | ORAL_TABLET | Freq: Two times a day (BID) | ORAL | Status: DC | PRN

## 2012-12-31 MED ORDER — HYOSCYAMINE SULFATE 0.125 MG SL SUBL: 0.1250 mg | SUBLINGUAL_TABLET | SUBLINGUAL | Status: DC | PRN

## 2012-12-31 NOTE — Assessment & Plan Note (Addendum)
No change on today's EKG,

## 2012-12-31 NOTE — Assessment & Plan Note (Signed)
Her EKG today is NSR today.

## 2012-12-31 NOTE — Patient Instructions (Addendum)
Trial  of lactulose (a cathartic laxative) for use when miralax fails.  Works in about 3 hours   Mammogram ordered.

## 2012-12-31 NOTE — Assessment & Plan Note (Signed)
Constipation predominant.  Lactulose prn insomnia.

## 2012-12-31 NOTE — Assessment & Plan Note (Signed)
well controlled by current labs.  No change today     

## 2012-12-31 NOTE — Progress Notes (Signed)
Patient ID: [name] [name], female   DOB: 1941-06-29, 72 y.o.   MRN: [id number]  Patient Active Problem List  Diagnosis  . Mitral valve prolapse  . Palpitations  . Supraventricular tachycardia  . Hyperlipidemia  . RBBB  . Anxiety associated with depression  . Cataract extraction status of right eye  . History of asbestos exposure  . Osteopenia  . IBS (irritable bowel syndrome)    Subjective:  CC:   Chief Complaint  Patient presents with  . Follow-up    HPI:   [name] [name] a 72 y.o. female who presents for 6 month follow up on chronic conditions including SVT , IBS, hyperlipidemia and anxiety.  Her levator ani muscle spasm is still a problem from time to time.  The last episode lasted 2 weeks,  Not aggravated by yoga.  No prior trial of muscle relaxer.  IBS episodes brought on by dietary indiscretions,  Gluten containing foods,  Recent episode occurred caused constipation despite using stool softener. She is concerned about it happening when she goes to [place] in the Spring    Cold intolerance throughout  Nov and Dec despite feeling great.  Had furnace checked bc it was so severe. Never actually got sick.   Has had some palpitations recently,  Not associated with exercise, dyspnea or chest pain .    Past Medical History  Diagnosis Date  . SVT (supraventricular tachycardia)   . MVP (mitral valve prolapse)   . Hyperlipidemia     Past Surgical History  Procedure Date  . Tubal ligation   . Foot surgery   . Varicose vein surgery          The following portions of the patient's history were reviewed and updated as appropriate: Allergies, current medications, and problem list.    Review of Systems:   12 Pt  review of systems was negative except those addressed in the HPI,     History   Social History  . Marital Status: Married    Spouse Name: Deceased    Number of Children: N/A  . Years of Education: 16   Occupational History  . Not on file.    Social History Main Topics  . Smoking status: Never Smoker   . Smokeless tobacco: Never Used  . Alcohol Use: 1.5 oz/week    3 drink(s) per week  . Drug Use: No  . Sexually Active: No   Other Topics Concern  . Not on file   Social History Narrative   She is a widow.  Husband died last year from metastatic renal cell cancer    Objective:  BP 112/80  Pulse 64  Temp 97.9 F (36.6 C) (Oral)  Resp 16  Ht 5' 6.5" (1.689 m)  Wt 145 lb (65.772 kg)  BMI 23.05 kg/m2  SpO2 98%  General appearance: alert, cooperative and appears stated age Ears: normal TM's and external ear canals both ears Throat: lips, mucosa, and tongue normal; teeth and gums normal Neck: no adenopathy, no carotid bruit, supple, symmetrical, trachea midline and thyroid not enlarged, symmetric, no tenderness/mass/nodules Back: symmetric, no curvature. ROM normal. No CVA tenderness. Lungs: clear to auscultation bilaterally Heart: regular rate and rhythm, S1, dixed split S2,  no murmur, click, rub or gallop Abdomen: soft, non-tender; bowel sounds normal; no masses,  no organomegaly Pulses: 2+ and symmetric Skin: Skin color, texture, turgor normal. No rashes or lesions Lymph nodes: Cervical, supraclavicular, and axillary nodes normal.  Assessment and Plan:  Palpitations Her EKG  today is NSR today.   RBBB No change on today's EKG,    Hyperlipidemia well controlled by current labs.  No change today    IBS (irritable bowel syndrome) Constipation predominant.  Lactulose prn insomnia.    Updated Medication List Outpatient Encounter Prescriptions as of 12/31/2012  Medication Sig Dispense Refill  . aspirin-acetaminophen-caffeine (EXCEDRIN MIGRAINE) 250-250-65 MG per tablet Take 1 tablet by mouth every 6 (six) hours as needed.        Marland Kitchen b complex vitamins tablet Take 1 tablet by mouth daily.        . butalbital-acetaminophen-caffeine (FIORICET, ESGIC) 50-325-40 MG per tablet TAKE 1 TABLET AS NEEDED FOR BACK  PAIN OR HEADACHE  90 tablet  1  . CALCIUM PO Take 1,000 mg by mouth daily.        . Cholecalciferol (VITAMIN D) 1000 UNITS capsule Take 1,000 Units by mouth daily.        . Flaxseed, Linseed, (FLAX SEED OIL) 1000 MG CAPS Take by mouth daily.        . Ibuprofen (MOTRIN PO) Take by mouth as needed.        . montelukast (SINGULAIR) 10 MG tablet Take 10 mg by mouth at bedtime.        . Multiple Vitamin (MULTIVITAMIN) tablet Take 1 tablet by mouth daily.        . Omega-3 Fatty Acids (FISH OIL PO) Take by mouth daily.        . Probiotic Product (PROBIOTIC PO) Take by mouth daily.        . rosuvastatin (CRESTOR) 5 MG tablet Take 1 tablet (5 mg total) by mouth every other day.  90 tablet  3  . vitamin C (ASCORBIC ACID) 500 MG tablet Take 500 mg by mouth daily.        . vitamin E 100 UNIT capsule Take 400 Units by mouth daily.        Marland Kitchen ALPRAZolam (XANAX) 0.25 MG tablet Take 1 tablet (0.25 mg total) by mouth at bedtime as needed for sleep.  30 tablet  2  . diazepam (VALIUM) 5 MG tablet Take 1 tablet (5 mg total) by mouth every 12 (twelve) hours as needed (muscle spasm).  30 tablet  1  . EPIPEN 2-PAK 0.3 MG/0.3ML DEVI       . hyoscyamine (LEVSIN SL) 0.125 MG SL tablet Place 1 tablet (0.125 mg total) under the tongue every 4 (four) hours as needed for cramping.  30 tablet  3  . Lactulose 20 GM/30ML SOLN Take 30 mLs (20 g total) by mouth every 4 (four) hours as needed. To relieve constipation  240 mL  3  . [DISCONTINUED] ALPRAZolam (NIRAVAM) 0.25 MG dissolvable tablet Take 1 tablet (0.25 mg total) by mouth at bedtime as needed.  30 tablet  2     Orders Placed This Encounter  Procedures  . MM Digital Screening  . Comprehensive metabolic panel  . CBC with Differential  . TSH  . Lipid panel  . EKG 12-Lead    No Follow-up on file.

## 2013-01-01 NOTE — Addendum Note (Signed)
Addended by: Sherlene Shams on: 01/01/2013 01:06 PM   Modules accepted: Orders

## 2013-01-20 ENCOUNTER — Ambulatory Visit: Payer: Self-pay | Admitting: Internal Medicine

## 2013-02-01 ENCOUNTER — Other Ambulatory Visit (INDEPENDENT_AMBULATORY_CARE_PROVIDER_SITE_OTHER): Payer: Medicare Other

## 2013-02-01 DIAGNOSIS — E785 Hyperlipidemia, unspecified: Secondary | ICD-10-CM

## 2013-02-01 DIAGNOSIS — D696 Thrombocytopenia, unspecified: Secondary | ICD-10-CM

## 2013-02-01 LAB — CBC WITH DIFFERENTIAL/PLATELET
Basophils Absolute: 0 10*3/uL (ref 0.0–0.1)
Basophils Relative: 0.5 % (ref 0.0–3.0)
Eosinophils Absolute: 0.2 10*3/uL (ref 0.0–0.7)
Eosinophils Relative: 4.7 % (ref 0.0–5.0)
HCT: 37.4 % (ref 36.0–46.0)
Hemoglobin: 12.7 g/dL (ref 12.0–15.0)
Lymphocytes Relative: 32.8 % (ref 12.0–46.0)
Lymphs Abs: 1.6 10*3/uL (ref 0.7–4.0)
MCHC: 33.9 g/dL (ref 30.0–36.0)
MCV: 89.2 fl (ref 78.0–100.0)
Monocytes Absolute: 0.3 10*3/uL (ref 0.1–1.0)
Monocytes Relative: 6.4 % (ref 3.0–12.0)
Neutro Abs: 2.7 10*3/uL (ref 1.4–7.7)
Neutrophils Relative %: 55.6 % (ref 43.0–77.0)
Platelets: 128 10*3/uL — ABNORMAL LOW (ref 150.0–400.0)
RBC: 4.19 Mil/uL (ref 3.87–5.11)
RDW: 13.4 % (ref 11.5–14.6)
WBC: 4.9 10*3/uL (ref 4.5–10.5)

## 2013-02-04 ENCOUNTER — Encounter: Payer: Self-pay | Admitting: Internal Medicine

## 2013-02-04 DIAGNOSIS — D696 Thrombocytopenia, unspecified: Secondary | ICD-10-CM | POA: Insufficient documentation

## 2013-02-04 NOTE — Addendum Note (Signed)
Addended by: Sherlene Shams on: 02/04/2013 10:05 PM   Modules accepted: Orders, Medications

## 2013-02-05 LAB — LACTATE DEHYDROGENASE, ISOENZYMES
LD1/LD2 Ratio: 0.78
LDH 1: 28 % (ref 19–38)
LDH 2: 36 % (ref 30–43)
LDH 3: 24 % (ref 16–26)
LDH 4: 7 % (ref 3–12)
LDH 5: 6 % (ref 3–14)
LDH Isoenzymes, Total: 175 U/L (ref 120–250)

## 2013-03-22 ENCOUNTER — Other Ambulatory Visit: Payer: Medicare Other

## 2013-03-25 ENCOUNTER — Other Ambulatory Visit (INDEPENDENT_AMBULATORY_CARE_PROVIDER_SITE_OTHER): Payer: Medicare Other

## 2013-03-25 DIAGNOSIS — E785 Hyperlipidemia, unspecified: Secondary | ICD-10-CM

## 2013-03-25 DIAGNOSIS — D696 Thrombocytopenia, unspecified: Secondary | ICD-10-CM

## 2013-03-25 LAB — CBC WITH DIFFERENTIAL/PLATELET
Basophils Absolute: 0 10*3/uL (ref 0.0–0.1)
Basophils Relative: 0.9 % (ref 0.0–3.0)
Eosinophils Absolute: 0.1 10*3/uL (ref 0.0–0.7)
Eosinophils Relative: 3.4 % (ref 0.0–5.0)
HCT: 36.8 % (ref 36.0–46.0)
Hemoglobin: 12.6 g/dL (ref 12.0–15.0)
Lymphocytes Relative: 36.4 % (ref 12.0–46.0)
Lymphs Abs: 1.5 10*3/uL (ref 0.7–4.0)
MCHC: 34.2 g/dL (ref 30.0–36.0)
MCV: 89.5 fl (ref 78.0–100.0)
Monocytes Absolute: 0.3 10*3/uL (ref 0.1–1.0)
Monocytes Relative: 6.6 % (ref 3.0–12.0)
Neutro Abs: 2.1 10*3/uL (ref 1.4–7.7)
Neutrophils Relative %: 52.7 % (ref 43.0–77.0)
Platelets: 116 10*3/uL — ABNORMAL LOW (ref 150.0–400.0)
RBC: 4.12 Mil/uL (ref 3.87–5.11)
RDW: 13.4 % (ref 11.5–14.6)
WBC: 4 10*3/uL — ABNORMAL LOW (ref 4.5–10.5)

## 2013-03-25 LAB — LIPID PANEL
Cholesterol: 206 mg/dL — ABNORMAL HIGH (ref 0–200)
HDL: 58.9 mg/dL (ref >39.00)
Total CHOL/HDL Ratio: 3
Triglycerides: 75 mg/dL (ref 0.0–149.0)
VLDL: 15 mg/dL (ref 0.0–40.0)

## 2013-03-25 LAB — LDL CHOLESTEROL, DIRECT: Direct LDL: 132.5 mg/dL

## 2013-03-25 NOTE — Assessment & Plan Note (Signed)
Her platelets have dropped from 128K to 116K despite suspending Crestor for 6 weeks.  She has also developed mild leukopenia.  Will refer to Hematology , Dr. Orlie Dakin to evaluation.

## 2013-03-25 NOTE — Addendum Note (Signed)
Addended by: Sherlene Shams on: 03/25/2013 09:36 PM   Modules accepted: Orders

## 2013-03-26 ENCOUNTER — Emergency Department: Payer: Self-pay | Admitting: Emergency Medicine

## 2013-03-26 HISTORY — PX: MANDIBLE FRACTURE SURGERY: SHX706

## 2013-03-29 DIAGNOSIS — S02609A Fracture of mandible, unspecified, initial encounter for closed fracture: Secondary | ICD-10-CM | POA: Insufficient documentation

## 2013-03-30 ENCOUNTER — Other Ambulatory Visit: Payer: Self-pay | Admitting: Internal Medicine

## 2013-03-30 ENCOUNTER — Telehealth: Payer: Self-pay | Admitting: *Deleted

## 2013-03-30 DIAGNOSIS — D696 Thrombocytopenia, unspecified: Secondary | ICD-10-CM

## 2013-03-30 NOTE — Telephone Encounter (Signed)
Pt was notified of her lab results, pt states that she broke her jaw and wrist Friday 04.25.2014 and will be having surgery this Thursday 05.1.2014 for her jaw at [facility] and said she would like to see a local Hematologist and if that can be set up 3 to 4 weeks from now so that she can heal

## 2013-03-30 NOTE — Telephone Encounter (Signed)
Referral is in process as requested with Dr Orlie Dakin

## 2013-04-08 ENCOUNTER — Ambulatory Visit: Payer: Self-pay | Admitting: Oncology

## 2013-04-08 LAB — CBC CANCER CENTER
Basophil #: 0.1 x10 3/mm (ref 0.0–0.1)
Basophil %: 1 %
Eosinophil #: 0.1 x10 3/mm (ref 0.0–0.7)
Eosinophil %: 2.2 %
HCT: 40.2 % (ref 35.0–47.0)
HGB: 13.6 g/dL (ref 12.0–16.0)
Lymphocyte #: 1.2 x10 3/mm (ref 1.0–3.6)
Lymphocyte %: 22.3 %
MCH: 30.2 pg (ref 26.0–34.0)
MCHC: 33.9 g/dL (ref 32.0–36.0)
MCV: 89 fL (ref 80–100)
Monocyte #: 0.4 x10 3/mm (ref 0.2–0.9)
Monocyte %: 6.8 %
Neutrophil #: 3.7 x10 3/mm (ref 1.4–6.5)
Neutrophil %: 67.7 %
Platelet: 168 x10 3/mm (ref 150–440)
RBC: 4.51 10*6/uL (ref 3.80–5.20)
RDW: 13.5 % (ref 11.5–14.5)
WBC: 5.4 x10 3/mm (ref 3.6–11.0)

## 2013-04-08 LAB — IRON AND TIBC
Iron Bind.Cap.(Total): 296 ug/dL (ref 250–450)
Iron Saturation: 25 %
Iron: 74 ug/dL (ref 50–170)
Unbound Iron-Bind.Cap.: 222 ug/dL

## 2013-04-08 LAB — LACTATE DEHYDROGENASE: LDH: 263 U/L — ABNORMAL HIGH (ref 81–246)

## 2013-04-08 LAB — FERRITIN: Ferritin (ARMC): 65 ng/mL (ref 8–388)

## 2013-04-08 LAB — FOLATE: Folic Acid: 42 ng/mL (ref 3.1–100.0)

## 2013-04-12 LAB — PROT IMMUNOELECTROPHORES(ARMC)

## 2013-04-15 DIAGNOSIS — Z09 Encounter for follow-up examination after completed treatment for conditions other than malignant neoplasm: Secondary | ICD-10-CM | POA: Insufficient documentation

## 2013-05-02 ENCOUNTER — Ambulatory Visit: Payer: Self-pay | Admitting: Oncology

## 2013-05-21 LAB — CBC CANCER CENTER
Basophil #: 0 x10 3/mm (ref 0.0–0.1)
Basophil %: 0.9 %
Eosinophil #: 0.1 x10 3/mm (ref 0.0–0.7)
Eosinophil %: 2.2 %
HCT: 35.2 % (ref 35.0–47.0)
HGB: 12.2 g/dL (ref 12.0–16.0)
Lymphocyte #: 1.4 x10 3/mm (ref 1.0–3.6)
Lymphocyte %: 31.8 %
MCH: 31.1 pg (ref 26.0–34.0)
MCHC: 34.5 g/dL (ref 32.0–36.0)
MCV: 90 fL (ref 80–100)
Monocyte #: 0.3 x10 3/mm (ref 0.2–0.9)
Monocyte %: 6.7 %
Neutrophil #: 2.5 x10 3/mm (ref 1.4–6.5)
Neutrophil %: 58.4 %
Platelet: 111 x10 3/mm — ABNORMAL LOW (ref 150–440)
RBC: 3.91 10*6/uL (ref 3.80–5.20)
RDW: 14 % (ref 11.5–14.5)
WBC: 4.3 x10 3/mm (ref 3.6–11.0)

## 2013-06-01 ENCOUNTER — Ambulatory Visit: Payer: Self-pay | Admitting: Oncology

## 2013-06-25 ENCOUNTER — Other Ambulatory Visit: Payer: Self-pay | Admitting: Internal Medicine

## 2013-08-23 ENCOUNTER — Ambulatory Visit: Payer: Self-pay | Admitting: Oncology

## 2013-08-23 LAB — CBC CANCER CENTER
Basophil #: 0 x10 3/mm (ref 0.0–0.1)
Basophil %: 0.9 %
Eosinophil #: 0.1 x10 3/mm (ref 0.0–0.7)
Eosinophil %: 1.8 %
HCT: 38.7 % (ref 35.0–47.0)
HGB: 13.1 g/dL (ref 12.0–16.0)
Lymphocyte #: 1.7 x10 3/mm (ref 1.0–3.6)
Lymphocyte %: 30.6 %
MCH: 30.5 pg (ref 26.0–34.0)
MCHC: 33.8 g/dL (ref 32.0–36.0)
MCV: 90 fL (ref 80–100)
Monocyte #: 0.4 x10 3/mm (ref 0.2–0.9)
Monocyte %: 6.7 %
Neutrophil #: 3.3 x10 3/mm (ref 1.4–6.5)
Neutrophil %: 60 %
Platelet: 132 x10 3/mm — ABNORMAL LOW (ref 150–440)
RBC: 4.28 10*6/uL (ref 3.80–5.20)
RDW: 13.5 % (ref 11.5–14.5)
WBC: 5.5 x10 3/mm (ref 3.6–11.0)

## 2013-09-01 ENCOUNTER — Ambulatory Visit: Payer: Self-pay | Admitting: Oncology

## 2013-09-07 ENCOUNTER — Encounter: Payer: Self-pay | Admitting: Cardiovascular Disease

## 2013-09-07 ENCOUNTER — Ambulatory Visit (INDEPENDENT_AMBULATORY_CARE_PROVIDER_SITE_OTHER): Payer: Medicare Other | Admitting: Cardiovascular Disease

## 2013-09-07 VITALS — BP 110/70 | HR 63 | Ht 66.5 in | Wt 142.8 lb

## 2013-09-07 DIAGNOSIS — I498 Other specified cardiac arrhythmias: Secondary | ICD-10-CM

## 2013-09-07 DIAGNOSIS — I341 Nonrheumatic mitral (valve) prolapse: Secondary | ICD-10-CM

## 2013-09-07 DIAGNOSIS — E785 Hyperlipidemia, unspecified: Secondary | ICD-10-CM

## 2013-09-07 DIAGNOSIS — I471 Supraventricular tachycardia: Secondary | ICD-10-CM

## 2013-09-07 DIAGNOSIS — I4510 Unspecified right bundle-branch block: Secondary | ICD-10-CM

## 2013-09-07 DIAGNOSIS — I059 Rheumatic mitral valve disease, unspecified: Secondary | ICD-10-CM

## 2013-09-07 MED ORDER — ROSUVASTATIN CALCIUM 5 MG PO TABS: 5.0000 mg | ORAL_TABLET | ORAL | Status: DC

## 2013-09-07 NOTE — Assessment & Plan Note (Signed)
Stable

## 2013-09-07 NOTE — Progress Notes (Signed)
[name] [name] Date of Birth  [birth year]       [name] [address]  [address] [place], [place]  [address]   [name], [place]  [address] [phone]     [phone]   Fax  [phone]    Fax [phone]  Problem List: 1. Supraventricular tachycardia 2. Mitral valve prolapse 3. Cataracts  History of Present Illness:  [name] [name] is a  72 yo with hx of SVT and MVP.  She needs to have cataract surgery and is hear for pre op evaluation.  She has done well.  She has not been exercising as much as she would like.  She has not had any syncope or presyncope.  She has been taking care of her granddaughter who has ADD and a sleep disorder.  [name] [name] did not get much sleep that weekend and had lots of palpitations for the following week.  Oct. 7, 2014  [name] [name] is seen after a 1 1/2 year absence.  She has had some fatigue.   Dr. Darrick Huntsman has found thrombocytopenia.  Crestor was stopped but this did not help the thrombocytopenia.   She has seen a hemotologist.    She fell  (caught her toe at a [employer] - not syncope) several months ago and had reconstruction of her jaw.  She also broke her left wrist.     Current Outpatient Prescriptions on File Prior to Visit  Medication Sig Dispense Refill  . aspirin-acetaminophen-caffeine (EXCEDRIN MIGRAINE) 250-250-65 MG per tablet Take 1 tablet by mouth every 6 (six) hours as needed.        Marland Kitchen b complex vitamins tablet Take 1 tablet by mouth daily.        . butalbital-acetaminophen-caffeine (FIORICET, ESGIC) 50-325-40 MG per tablet TAKE 1 TABLET AS NEEDED FOR BACK PAIN OR HEADACHE  90 tablet  0  . CALCIUM PO Take 1,000 mg by mouth daily.        . Cholecalciferol (VITAMIN D) 1000 UNITS capsule Take 1,000 Units by mouth daily.        Marland Kitchen EPIPEN 2-PAK 0.3 MG/0.3ML DEVI       . Ibuprofen (MOTRIN PO) Take by mouth as needed.        . montelukast (SINGULAIR) 10 MG tablet Take 10 mg by mouth at bedtime.         . Multiple Vitamin (MULTIVITAMIN) tablet Take 1 tablet by mouth daily.        . Omega-3 Fatty Acids (FISH OIL PO) Take by mouth daily.        . Probiotic Product (PROBIOTIC PO) Take by mouth daily.        . vitamin C (ASCORBIC ACID) 500 MG tablet Take 500 mg by mouth daily.        . vitamin E 100 UNIT capsule Take 400 Units by mouth daily.         No current facility-administered medications on file prior to visit.    Allergies  Allergen Reactions  . Codeine   . Doxycycline   . Hydrocodone   . Hydromorphone   . Meloxicam   . Oxycodone     Past Medical History  Diagnosis Date  . SVT (supraventricular tachycardia)   . MVP (mitral valve prolapse)   . Hyperlipidemia   . Idiopathic thrombocytopenic purpura (ITP)     Past Surgical History  Procedure Laterality Date  . Tubal ligation    . Foot surgery    .  Varicose vein surgery    . Mandible fracture surgery      History  Smoking status  . Never Smoker   Smokeless tobacco  . Never Used    History  Alcohol Use  . 1.5 oz/week  . 3 drink(s) per week    Family History  Problem Relation Age of Onset  . Hypertension Mother   . Arrhythmia Mother   . Heart failure Mother   . Arrhythmia Brother     Reviw of Systems:  Reviewed in the HPI.  All other systems are negative.  Physical Exam: Blood pressure 110/70, pulse 63, height 5' 6.5" (1.689 m), weight 142 lb 12 oz (64.751 kg). General: Well developed, well nourished, in no acute distress. Head: Normocephalic, atraumatic, sclera non-icteric, mucus membranes are moist,  Neck: Supple. Carotids are 2 + without bruits. No JVD Lungs: Clear bilaterally to auscultation. Heart: regular rate.  normal  S1 S2. She has a midsystolic click. There is a 2/6 systolic ejection murmur at the axilla.  She has occasional PVCs. Abdomen: Soft, non-tender, non-distended with normal bowel sounds. No hepatomegaly. No rebound/guarding. No masses. Msk:  Strength and tone are  normal Extremities: No clubbing or cyanosis. No edema.  Distal pedal pulses are 2+ and equal bilaterally. Neuro: Alert and oriented X 3. Moves all extremities spontaneously. Psych:  Responds to questions appropriately with a normal affect.  ECG: Oct. 7, 2014:  NSR at 62. RBBB.   Assessment / Plan:

## 2013-09-07 NOTE — Patient Instructions (Addendum)
We will draw blood today.  Your physician wants you to follow-up in: 1 year.  You will receive a reminder letter in the mail two months in advance.  If you don't receive a letter, please call our office to schedule the follow-up appointment.

## 2013-09-07 NOTE — Assessment & Plan Note (Signed)
She is stable.  No recent episodes of SVT.  Continue current meds.

## 2013-09-07 NOTE — Assessment & Plan Note (Signed)
Will restart her Crestor at 5 mg every other day.  Check lipids, liver, BMP.   I'll recheck in 1 year.

## 2013-09-08 ENCOUNTER — Telehealth: Payer: Self-pay

## 2013-09-08 LAB — LIPID PANEL
Chol/HDL Ratio: 2.6 ratio units (ref 0.0–4.4)
Cholesterol, Total: 197 mg/dL (ref 100–199)
HDL: 77 mg/dL (ref >39)
LDL Calculated: 109 mg/dL — ABNORMAL HIGH (ref 0–99)
Triglycerides: 54 mg/dL (ref 0–149)
VLDL Cholesterol Cal: 11 mg/dL (ref 5–40)

## 2013-09-08 LAB — HEPATIC FUNCTION PANEL
ALT: 37 IU/L — ABNORMAL HIGH (ref 0–32)
AST: 38 IU/L (ref 0–40)
Albumin: 4.3 g/dL (ref 3.5–4.8)
Alkaline Phosphatase: 99 IU/L (ref 39–117)
Bilirubin, Direct: 0.11 mg/dL (ref 0.00–0.40)
Total Bilirubin: 0.4 mg/dL (ref 0.0–1.2)
Total Protein: 6.1 g/dL (ref 6.0–8.5)

## 2013-09-08 LAB — BASIC METABOLIC PANEL
BUN/Creatinine Ratio: 30 — ABNORMAL HIGH (ref 11–26)
BUN: 22 mg/dL (ref 8–27)
CO2: 22 mmol/L (ref 18–29)
Calcium: 9 mg/dL (ref 8.6–10.2)
Chloride: 101 mmol/L (ref 97–108)
Creatinine, Ser: 0.73 mg/dL (ref 0.57–1.00)
GFR calc Af Amer: 95 mL/min/{1.73_m2} (ref >59)
GFR calc non Af Amer: 83 mL/min/{1.73_m2} (ref >59)
Glucose: 78 mg/dL (ref 65–99)
Potassium: 4.2 mmol/L (ref 3.5–5.2)
Sodium: 141 mmol/L (ref 134–144)

## 2013-09-08 NOTE — Telephone Encounter (Signed)
Left detailed message on machine with results. Asked pt to call with any questions or concerns.

## 2013-09-08 NOTE — Telephone Encounter (Signed)
Message copied by Marilynne Halsted on Wed Sep 08, 2013  2:48 PM ------      Message from: Vesta Mixer      Created: Wed Sep 08, 2013  1:47 PM       She has just recently started back on her crestor.  Labs look ok       ------

## 2013-09-10 ENCOUNTER — Telehealth: Payer: Self-pay

## 2013-09-10 NOTE — Telephone Encounter (Signed)
Spoke with Gerilyn Pilgrim from [employer] verified ok to give #90 day supply.

## 2013-09-10 NOTE — Telephone Encounter (Signed)
[facility] called needs to clarify crestor rx

## 2013-10-07 ENCOUNTER — Other Ambulatory Visit: Payer: Self-pay

## 2013-10-15 ENCOUNTER — Other Ambulatory Visit: Payer: Self-pay | Admitting: Internal Medicine

## 2013-10-18 NOTE — Telephone Encounter (Signed)
90 day supply authorized ; needs OV prior to more refills

## 2013-11-02 ENCOUNTER — Telehealth: Payer: Self-pay | Admitting: Internal Medicine

## 2013-11-02 DIAGNOSIS — Z1211 Encounter for screening for malignant neoplasm of colon: Secondary | ICD-10-CM

## 2013-11-02 NOTE — Telephone Encounter (Signed)
Please advise 

## 2013-11-02 NOTE — Telephone Encounter (Signed)
Amber see note from Lupita Leash about available dates. thanks

## 2013-11-02 NOTE — Telephone Encounter (Signed)
Pt asking for referral for colonoscopy.  Consult does not matter which day because pt can drive herself.  Monday Wednesday Friday preferable for the procedure so that she has transportation.  Does not care if it is in [place] or [place], just whoever Dr. Darrick Huntsman prefers to do the procedure.  Pt states this was due in 2013 and she has put it off.  Has appt with Dr. Orlie Dakin 12/22 so could not schedule for that day.    CPE scheduled 01/2014.

## 2013-11-22 ENCOUNTER — Ambulatory Visit: Payer: Self-pay | Admitting: Oncology

## 2013-11-22 LAB — CBC CANCER CENTER
Basophil #: 0 x10 3/mm (ref 0.0–0.1)
Basophil %: 0.9 %
Eosinophil #: 0.1 x10 3/mm (ref 0.0–0.7)
Eosinophil %: 2.4 %
HCT: 39.6 % (ref 35.0–47.0)
HGB: 12.9 g/dL (ref 12.0–16.0)
Lymphocyte #: 1.7 x10 3/mm (ref 1.0–3.6)
Lymphocyte %: 36.8 %
MCH: 29.8 pg (ref 26.0–34.0)
MCHC: 32.7 g/dL (ref 32.0–36.0)
MCV: 91 fL (ref 80–100)
Monocyte #: 0.3 x10 3/mm (ref 0.2–0.9)
Monocyte %: 7.3 %
Neutrophil #: 2.4 x10 3/mm (ref 1.4–6.5)
Neutrophil %: 52.6 %
Platelet: 122 x10 3/mm — ABNORMAL LOW (ref 150–440)
RBC: 4.34 10*6/uL (ref 3.80–5.20)
RDW: 13.7 % (ref 11.5–14.5)
WBC: 4.6 x10 3/mm (ref 3.6–11.0)

## 2013-12-02 ENCOUNTER — Ambulatory Visit: Payer: Self-pay | Admitting: Oncology

## 2013-12-07 ENCOUNTER — Other Ambulatory Visit: Payer: Self-pay | Admitting: General Surgery

## 2013-12-07 ENCOUNTER — Encounter: Payer: Self-pay | Admitting: General Surgery

## 2013-12-07 ENCOUNTER — Ambulatory Visit (INDEPENDENT_AMBULATORY_CARE_PROVIDER_SITE_OTHER): Payer: Medicare Other | Admitting: General Surgery

## 2013-12-07 VITALS — BP 126/74 | HR 76 | Resp 12 | Ht 66.0 in | Wt 144.0 lb

## 2013-12-07 DIAGNOSIS — Z1211 Encounter for screening for malignant neoplasm of colon: Secondary | ICD-10-CM

## 2013-12-07 MED ORDER — POLYETHYLENE GLYCOL 3350 17 GM/SCOOP PO POWD
ORAL | Status: DC
Start: 2013-12-07 — End: 2014-08-01

## 2013-12-07 NOTE — Progress Notes (Signed)
Patient ID: [name] [name], female   DOB: [birth year], 73 y.o.   MRN: [id number]  Chief Complaint  Patient presents with  . Colonoscopy    HPI [name] [name] is a 73 y.o. female here today for an screening colonoscopy. Patient states her last colonoscopy was in 2003 in [place] . She reports no GI problems at this time.  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 She moved to [place] to be closer to her children and grandchildren. Her husband passed about 5 years ago. HPI  Past Medical History  Diagnosis Date  . SVT (supraventricular tachycardia)   . MVP (mitral valve prolapse)   . Hyperlipidemia   . Idiopathic thrombocytopenic purpura (ITP)   . Hemorrhoid     Past Surgical History  Procedure Laterality Date  . Tubal ligation    . Foot surgery    . Varicose vein surgery    . Mandible fracture surgery  03-26-13  . Colonoscopy  2003  . Eye surgery Right 2013    Family History  Problem Relation Age of Onset  . Hypertension Mother   . Arrhythmia Mother   . Heart failure Mother   . Arrhythmia Brother     Social History History  Substance Use Topics  . Smoking status: Never Smoker   . Smokeless tobacco: Never Used  . Alcohol Use: 1.5 oz/week    3 drink(s) per week     Comment: occasionally    Allergies  Allergen Reactions  . Codeine Other (See Comments)    migraine  . Doxycycline Itching  . Hydrocodone Nausea And Vomiting  . Hydromorphone Nausea And Vomiting  . Meloxicam   . Oxycodone     Current Outpatient Prescriptions  Medication Sig Dispense Refill  . aspirin-acetaminophen-caffeine (EXCEDRIN MIGRAINE) 250-250-65 MG per tablet Take 1 tablet by mouth every 6 (six) hours as needed.        Marland Kitchen b complex vitamins tablet Take 1 tablet by mouth daily.        . butalbital-acetaminophen-caffeine (FIORICET, ESGIC) 50-325-40 MG per tablet TAKE 1 TABLET AS NEEDED FOR BACK PAIN OR HEADACHE  90 tablet  0  . Cholecalciferol  (VITAMIN D) 1000 UNITS capsule Take 1,000 Units by mouth daily.        Marland Kitchen EPIPEN 2-PAK 0.3 MG/0.3ML DEVI       . Ibuprofen (MOTRIN PO) Take by mouth as needed.        . montelukast (SINGULAIR) 10 MG tablet Take 10 mg by mouth at bedtime.        . Multiple Vitamin (MULTIVITAMIN) tablet Take 1 tablet by mouth daily.        . NON FORMULARY citrocel as needed.      . Omega-3 Fatty Acids (FISH OIL PO) Take by mouth daily.        . Probiotic Product (PROBIOTIC PO) Take by mouth daily.        . rosuvastatin (CRESTOR) 5 MG tablet Take 1 tablet (5 mg total) by mouth every other day.  30 tablet  6  . vitamin C (ASCORBIC ACID) 500 MG tablet Take 500 mg by mouth daily.        . vitamin E 100 UNIT capsule Take 400 Units by mouth daily.        . polyethylene glycol powder (GLYCOLAX/MIRALAX) powder 255 grams one bottle for colonoscopy prep  255 g  0   No current facility-administered medications for this visit.    Review  of Systems Review of Systems  Constitutional: Negative.   Respiratory: Negative.   Cardiovascular: Negative.   Gastrointestinal: Negative.     Blood pressure 126/74, pulse 76, resp. rate 12, height 5\' 6"  (1.676 m), weight 144 lb (65.318 kg).  Physical Exam Physical Exam  Constitutional: She is oriented to person, place, and time. She appears well-developed and well-nourished.  Eyes: Conjunctivae are normal. No scleral icterus.  Neck: Neck supple.  Cardiovascular: Normal rate, regular rhythm and normal heart sounds.   Pulmonary/Chest: Effort normal and breath sounds normal.  Abdominal: Soft. Bowel sounds are normal. There is no tenderness.  Neurological: She is alert and oriented to person, place, and time.  Skin: Skin is warm and dry.    Data Reviewed CBC completed November 22, 2013 showed a platelet count of 122,000. Hemoglobin 12.9, white blood cell count 4600 with normal differential.  Assessment    Candidate for screening colonoscopy. History modest change in bowel  habits    Plan    The patient did use of number of over-the-counter medications for her bowels. She was encouraged to make use of Citrucel O. Daily basis due to her report of scybala -like stools.  The pros and cons of colon screening were reviewed, risks including those of bleeding and perforation.      Patient has been scheduled for a colonoscopy on 01-05-14 at [facility]. This patient has been asked to discontinue fish oil one week prior to procedure.    Robert Bellow 12/07/2013, 8:19 PM

## 2013-12-07 NOTE — Patient Instructions (Addendum)
Colonoscopy A colonoscopy is an exam to evaluate your entire colon. In this exam, your colon is cleansed. A long fiberoptic tube is inserted through your rectum and into your colon. The fiberoptic scope (endoscope) is a long bundle of enclosed and very flexible fibers. These fibers transmit light to the area examined and send images from that area to your caregiver. Discomfort is usually minimal. You may be given a drug to help you sleep (sedative) during or prior to the procedure. This exam helps to detect lumps (tumors), polyps, inflammation, and areas of bleeding. Your caregiver may also take a small piece of tissue (biopsy) that will be examined under a microscope. LET YOUR CAREGIVER KNOW ABOUT:   Allergies to food or medicine.  Medicines taken, including vitamins, herbs, eyedrops, over-the-counter medicines, and creams.  Use of steroids (by mouth or creams).  Previous problems with anesthetics or numbing medicines.  History of bleeding problems or blood clots.  Previous surgery.  Other health problems, including diabetes and kidney problems.  Possibility of pregnancy, if this applies. BEFORE THE PROCEDURE   A clear liquid diet may be required for 2 days before the exam.  Ask your caregiver about changing or stopping your regular medications.  Liquid injections (enemas) or laxatives may be required.  A large amount of electrolyte solution may be given to you to drink over a short period of time. This solution is used to clean out your colon.  You should be present 60 minutes prior to your procedure or as directed by your caregiver. AFTER THE PROCEDURE   If you received a sedative or pain relieving medication, you will need to arrange for someone to drive you home.  Occasionally, there is a little blood passed with the first bowel movement. Do not be concerned. FINDING OUT THE RESULTS OF YOUR TEST Not all test results are available during your visit. If your test results are  not back during the visit, make an appointment with your caregiver to find out the results. Do not assume everything is normal if you have not heard from your caregiver or the medical facility. It is important for you to follow up on all of your test results. HOME CARE INSTRUCTIONS   It is not unusual to pass moderate amounts of gas and experience mild abdominal cramping following the procedure. This is due to air being used to inflate your colon during the exam. Walking or a warm pack on your belly (abdomen) may help.  You may resume all normal meals and activities after sedatives and medicines have worn off.  Only take over-the-counter or prescription medicines for pain, discomfort, or fever as directed by your caregiver. Do not use aspirin or blood thinners if a biopsy was taken. Consult your caregiver for medicine usage if biopsies were taken. SEEK IMMEDIATE MEDICAL CARE IF:   You have a fever.  You pass large blood clots or fill a toilet with blood following the procedure. This may also occur 10 to 14 days following the procedure. This is more likely if a biopsy was taken.  You develop abdominal pain that keeps getting worse and cannot be relieved with medicine. Document Released: 11/15/2000 Document Revised: 02/10/2012 Document Reviewed: 06/21/2013 [facility] Patient Information 2014 [place].  Patient has been scheduled for a colonoscopy on 01-05-14 at [facility]. This patient has been asked to discontinue fish oil one week prior to procedure.

## 2013-12-13 ENCOUNTER — Other Ambulatory Visit: Payer: Self-pay

## 2013-12-13 MED ORDER — ROSUVASTATIN CALCIUM 5 MG PO TABS: 5.0000 mg | ORAL_TABLET | ORAL | Status: DC

## 2013-12-13 NOTE — Telephone Encounter (Signed)
Pt does not want generic.

## 2013-12-13 NOTE — Telephone Encounter (Signed)
Requested Prescriptions   Signed Prescriptions Disp Refills  . rosuvastatin (CRESTOR) 5 MG tablet 30 tablet 3    Sig: Take 1 tablet (5 mg total) by mouth every other day.    Authorizing Provider: Thayer Headings    Ordering User: Britt Bottom

## 2013-12-23 ENCOUNTER — Telehealth: Payer: Self-pay | Admitting: Internal Medicine

## 2013-12-23 NOTE — Telephone Encounter (Signed)
Received records from Dr. Grayland Ormond.

## 2014-01-05 ENCOUNTER — Ambulatory Visit: Payer: Self-pay | Admitting: General Surgery

## 2014-01-05 ENCOUNTER — Encounter: Payer: Medicare Other | Admitting: Internal Medicine

## 2014-01-05 DIAGNOSIS — Z1211 Encounter for screening for malignant neoplasm of colon: Secondary | ICD-10-CM

## 2014-01-05 LAB — HM COLONOSCOPY

## 2014-01-06 ENCOUNTER — Encounter: Payer: Self-pay | Admitting: General Surgery

## 2014-01-09 ENCOUNTER — Encounter: Payer: Self-pay | Admitting: Internal Medicine

## 2014-01-09 DIAGNOSIS — K5730 Diverticulosis of large intestine without perforation or abscess without bleeding: Secondary | ICD-10-CM | POA: Insufficient documentation

## 2014-01-10 ENCOUNTER — Encounter: Payer: Self-pay | Admitting: Internal Medicine

## 2014-01-10 ENCOUNTER — Ambulatory Visit (INDEPENDENT_AMBULATORY_CARE_PROVIDER_SITE_OTHER): Payer: Medicare Other | Admitting: Internal Medicine

## 2014-01-10 VITALS — BP 128/62 | HR 64 | Temp 97.3°F | Resp 16 | Ht 65.9 in | Wt 142.8 lb

## 2014-01-10 DIAGNOSIS — Z1239 Encounter for other screening for malignant neoplasm of breast: Secondary | ICD-10-CM

## 2014-01-10 DIAGNOSIS — M899 Disorder of bone, unspecified: Secondary | ICD-10-CM

## 2014-01-10 DIAGNOSIS — M8580 Other specified disorders of bone density and structure, unspecified site: Secondary | ICD-10-CM

## 2014-01-10 DIAGNOSIS — D696 Thrombocytopenia, unspecified: Secondary | ICD-10-CM

## 2014-01-10 DIAGNOSIS — Z0000 Encounter for general adult medical examination without abnormal findings: Secondary | ICD-10-CM | POA: Insufficient documentation

## 2014-01-10 DIAGNOSIS — Z8781 Personal history of (healed) traumatic fracture: Secondary | ICD-10-CM

## 2014-01-10 DIAGNOSIS — Z1151 Encounter for screening for human papillomavirus (HPV): Secondary | ICD-10-CM | POA: Insufficient documentation

## 2014-01-10 DIAGNOSIS — Z124 Encounter for screening for malignant neoplasm of cervix: Secondary | ICD-10-CM

## 2014-01-10 DIAGNOSIS — M949 Disorder of cartilage, unspecified: Secondary | ICD-10-CM

## 2014-01-10 DIAGNOSIS — Z23 Encounter for immunization: Secondary | ICD-10-CM

## 2014-01-10 DIAGNOSIS — E785 Hyperlipidemia, unspecified: Secondary | ICD-10-CM

## 2014-01-10 MED ORDER — BUTALBITAL-APAP-CAFFEINE 50-325-40 MG PO TABS: ORAL_TABLET | ORAL | Status: DC

## 2014-01-10 MED ORDER — ALPRAZOLAM 0.5 MG PO TABS
0.5000 mg | ORAL_TABLET | Freq: Every evening | ORAL | Status: DC | PRN
Start: 2014-01-10 — End: 2015-08-18

## 2014-01-10 NOTE — Assessment & Plan Note (Signed)
well controlled by current labs.  No change today

## 2014-01-10 NOTE — Assessment & Plan Note (Signed)
She completed over 5 years of alendronate  Finishing in 2006 for prevention of osteoporosis and ha s had treatment since then.  Repeat DEXA due.

## 2014-01-10 NOTE — Assessment & Plan Note (Signed)
Stable with no change after suspension of crestor.

## 2014-01-10 NOTE — Patient Instructions (Addendum)
You had your annual Medicare wellness exam today including a PAP smear   We will schedule your mammogram and bone density tests soon.  You received the pneumonia (Prevnar)  vaccine today.  You do not need any labs today,  But we should repeat your liver tests and fasting lipids in April   You can resume 1200 mg calcium supplementation through diet and supplements and 2000 units of D3 daily April , then resume 1000 units daily

## 2014-01-10 NOTE — Progress Notes (Signed)
Pre-visit discussion using our clinic review tool. No additional management support is needed unless otherwise documented below in the visit note.  

## 2014-01-10 NOTE — Assessment & Plan Note (Signed)
Annual comprehensive exam was done including breast, pelvic and PAP smear. All screenings have been addressed .  

## 2014-01-10 NOTE — Progress Notes (Signed)
Patient ID: [name] [name], female   DOB: November 06, 1941, 73 y.o.   MRN: [id number]  The patient is here for annual Medicare wellness examination and management of other chronic and acute problems including chronic throbocytopenia, osteopenia, and recent fractures.  She was last seen a year ago.  Many things to discuss including jaw problems and altered speech since a traumatic fall April 2014.  fractured jaw in April after falling at [facility].  Referred to [place] surgeon Bud Face.  Records not available.    Had plates and  screws placed.  Post op was miserable.  Had bad reaction to dilaudid,  Had migraine and nausea but her mouth was banded shut for 2 weeks.    Had speech therapy, after noticed an alteration in phonation and speech.   Referred by dentist to Orthodontics for malalignment and braces who has deferred surgery bc of prior use of alendronate x 6 years ending in 2006   The risk factors are reflected in the social history.  The roster of all physicians providing medical care to patient - is listed in the Snapshot section of the chart.  Activities of daily living:  The patient is 100% independent in all ADLs: dressing, toileting, feeding as well as independent mobility  Home safety : The patient has smoke detectors in the home. They wear seatbelts.  There are no firearms at home. There is no violence in the home.   There is no risks for hepatitis, STDs or HIV. There is no   history of blood transfusion. They have no travel history to infectious disease endemic areas of the world.  The patient has seen their dentist in the last six month. They have seen their eye doctor in the last year. They admit to slight hearing difficulty with regard to whispered voices and some television programs.  They have deferred audiologic testing in the last year.  They do not  have excessive sun exposure. Discussed the need for sun protection: hats, long sleeves and use of sunscreen if there is significant  sun exposure.   Diet: the importance of a healthy diet is discussed. They do have a healthy diet.  The benefits of regular aerobic exercise were discussed. She walks 4 times per week ,  20 minutes.   Depression screen: there are no signs or vegative symptoms of depression- irritability, change in appetite, anhedonia, sadness/tearfullness.  Cognitive assessment: the patient manages all their financial and personal affairs and is actively engaged. They could relate day,date,year and events; recalled 2/3 objects at 3 minutes; performed clock-face test normally.  The following portions of the patient's history were reviewed and updated as appropriate: allergies, current medications, past family history, past medical history,  past surgical history, past social history  and problem list.  Visual acuity was not assessed per patient preference since she has regular follow up with her ophthalmologist. Hearing and body mass index were assessed and reviewed.   During the course of the visit the patient was educated and counseled about appropriate screening and preventive services including : fall prevention , diabetes screening, nutrition counseling, colorectal cancer screening, and recommended immunizations.    Objective: General Appearance:    Alert, cooperative, no distress, appears stated age  Head:    Normocephalic, without obvious abnormality, atraumatic  Eyes:    PERRL, conjunctiva/corneas clear, EOM's intact, fundi    benign, both eyes  Ears:    Normal TM's and external ear canals, both ears  Nose:   Nares  normal, septum midline, mucosa normal, no drainage    or sinus tenderness  Throat:   Lips, mucosa, and tongue normal; teeth and gums normal  Neck:   Supple, symmetrical, trachea midline, no adenopathy;    thyroid:  no enlargement/tenderness/nodules; no carotid   bruit or JVD  Back:     Symmetric, no curvature, ROM normal, no CVA tenderness  Lungs:     Clear to auscultation bilaterally,  respirations unlabored  Chest Wall:    No tenderness or deformity   Heart:    Regular rate and rhythm, S1 and S2 normal, no murmur, rub   or gallop  Breast Exam:    No tenderness, masses, or nipple abnormality  Abdomen:     Soft, non-tender, bowel sounds active all four quadrants,    no masses, no organomegaly  Genitalia:    Pelvic: cervix normal in appearance, external genitalia normal, no adnexal masses or tenderness, no cervical motion tenderness, rectovaginal septum normal, uterus normal size, shape, and consistency and vagina normal without discharge  Extremities:   Extremities normal, atraumatic, no cyanosis or edema  Pulses:   2+ and symmetric all extremities  Skin:   Skin color, texture, turgor normal, no rashes or lesions  Lymph nodes:   Cervical, supraclavicular, and axillary nodes normal  Neurologic:   CNII-XII intact, normal strength, sensation and reflexes    throughout    Assessment and Plan:   Visit for preventive health examination Annual comprehensive exam was done including breast, pelvic and PAP smear. All screenings have been addressed .   Thrombocytopenia, unspecified Stable with no change after suspension of crestor.   Osteopenia She completed over 5 years of alendronate  Finishing in 2006 for prevention of osteoporosis and ha s had treatment since then.  Repeat DEXA due.   Hyperlipidemia well controlled by current labs.  No change today     A total of 45 minutes was spent with patient more than half of which was spent in counseling, reviewing records from other prviders and coordination of care.   Updated Medication List Outpatient Encounter Prescriptions as of 01/10/2014  Medication Sig  . aspirin-acetaminophen-caffeine (EXCEDRIN MIGRAINE) 315-176-16 MG per tablet Take 1 tablet by mouth every 6 (six) hours as needed.    Marland Kitchen b complex vitamins tablet Take 1 tablet by mouth daily.    . butalbital-acetaminophen-caffeine (FIORICET, ESGIC) 50-325-40 MG per  tablet TAKE 1 TABLET AS NEEDED FOR BACK PAIN OR HEADACHE  . calcium citrate (CALCITRATE - DOSED IN MG ELEMENTAL CALCIUM) 950 MG tablet Take 200 mg of elemental calcium by mouth 3 (three) times daily with meals.  . Cholecalciferol (VITAMIN D) 1000 UNITS capsule Take 1,000 Units by mouth daily.    Marland Kitchen EPIPEN 2-PAK 0.3 MG/0.3ML DEVI   . Ibuprofen (MOTRIN PO) Take by mouth as needed.    . montelukast (SINGULAIR) 10 MG tablet Take 10 mg by mouth at bedtime.    . Multiple Vitamin (MULTIVITAMIN) tablet Take 1 tablet by mouth daily.    . NON FORMULARY citrocel as needed.  . Omega-3 Fatty Acids (FISH OIL PO) Take by mouth daily.    . polyethylene glycol powder (GLYCOLAX/MIRALAX) powder 255 grams one bottle for colonoscopy prep  . Probiotic Product (PROBIOTIC PO) Take by mouth daily.    . rosuvastatin (CRESTOR) 5 MG tablet Take 1 tablet (5 mg total) by mouth every other day.  . vitamin C (ASCORBIC ACID) 500 MG tablet Take 500 mg by mouth daily.    . vitamin E  100 UNIT capsule Take 400 Units by mouth daily.    . [DISCONTINUED] butalbital-acetaminophen-caffeine (FIORICET, ESGIC) 50-325-40 MG per tablet TAKE 1 TABLET AS NEEDED FOR BACK PAIN OR HEADACHE  . ALPRAZolam (XANAX) 0.5 MG tablet Take 1 tablet (0.5 mg total) by mouth at bedtime as needed for anxiety or sleep.

## 2014-01-12 ENCOUNTER — Encounter: Payer: Self-pay | Admitting: Internal Medicine

## 2014-01-12 ENCOUNTER — Other Ambulatory Visit (HOSPITAL_COMMUNITY)
Admission: RE | Admit: 2014-01-12 | Discharge: 2014-01-12 | Disposition: A | Discharge status: Home / Self Care | Payer: Medicare Other | Source: Ambulatory Visit | Attending: Internal Medicine | Admitting: Internal Medicine

## 2014-01-17 NOTE — Telephone Encounter (Signed)
Mailed copy of unread MyChart message to pt 

## 2014-01-19 ENCOUNTER — Other Ambulatory Visit: Payer: Self-pay | Admitting: *Deleted

## 2014-01-24 ENCOUNTER — Telehealth: Payer: Self-pay | Admitting: Internal Medicine

## 2014-01-24 NOTE — Telephone Encounter (Signed)
Left message for patient to return call to office. 

## 2014-01-24 NOTE — Telephone Encounter (Signed)
Patient was prescribed this in 1991 the only other thing she has taken is Excedrin , patient reports she normally takes one a day for back pain and headaches. IN 1991 patient was tried on propanolol, and Darvocet.  Patient member ID [id number] Phone number for prior authorization is [phone]

## 2014-01-24 NOTE — Telephone Encounter (Signed)
DARVOCET IS OFF THE MARKET AND PROPRANOLOL WILL NOT TREAT A HEADACHE THAT HAS ALREADY OCCURRED OR DO ANYTHING FOR BACK PAIN.  WOULD SHE LIKE TO TRY TRAMADOL FIRST?   50 MG ONE TABLET DAILY FOR BACK PAIN  #30

## 2014-01-24 NOTE — Telephone Encounter (Signed)
Her insurance will no longer provide coverage for Fioricet,  so we are having to go through a prior authorization,  Which is very time consuming.  There will a $29 charge to patient for the process because Dr  Derrel Nip and the staff spend approximately 20 to 30 minutes per authorization.  She can thank her insurance company for this.  I  Need to know What other medicatons for  Headaches has she tried?

## 2014-01-25 NOTE — Telephone Encounter (Signed)
Ok,  Can you proceed with PA?

## 2014-01-25 NOTE — Telephone Encounter (Signed)
Patient states she is not willing to try the tramadol because she has taken it also in the past and does not work for her. She would like to proceed with prior authorization and if this does not work she is willing to pay.

## 2014-01-28 DIAGNOSIS — Z0279 Encounter for issue of other medical certificate: Secondary | ICD-10-CM

## 2014-02-08 ENCOUNTER — Ambulatory Visit: Payer: Self-pay | Admitting: Internal Medicine

## 2014-02-08 LAB — HM DEXA SCAN

## 2014-02-13 ENCOUNTER — Telehealth: Payer: Self-pay | Admitting: Internal Medicine

## 2014-02-13 DIAGNOSIS — M810 Age-related osteoporosis without current pathological fracture: Secondary | ICD-10-CM

## 2014-02-13 NOTE — Telephone Encounter (Signed)
Her T scores have worsened over the last two years and forearm measurements are now osteoporosis range.  Since she has already taken alendronate for 5 years,. I would recommend the twice yearly injection Prolia if we can get it approved by  Her insurance.

## 2014-02-13 NOTE — Assessment & Plan Note (Signed)
T scores now -2.6  Forearm DEXA  3/15  -1/6 hip

## 2014-02-14 NOTE — Telephone Encounter (Signed)
Patient authorized to try for the prolia injections.

## 2014-02-14 NOTE — Telephone Encounter (Signed)
[name] [name]   Patient needs Prolia.,  Please initiate request. For osteoporosis treatment

## 2014-02-14 NOTE — Telephone Encounter (Signed)
Left message for patient to call office left detailed message need patient okay to go forward with Prolia verification from insurance.

## 2014-02-15 NOTE — Telephone Encounter (Signed)
I have sent email to [name] [name] to start the process for prolia authorization.

## 2014-02-21 ENCOUNTER — Telehealth: Payer: Self-pay | Admitting: Internal Medicine

## 2014-02-21 NOTE — Telephone Encounter (Signed)
The patient wants a copy of her Dexa scan report the one that was on my chart was difficult for her to read

## 2014-02-21 NOTE — Telephone Encounter (Signed)
I do not believe most current DEXA scan has been scan to chart.

## 2014-02-24 NOTE — Telephone Encounter (Signed)
Patient notified and ask when received she request copy.

## 2014-03-01 LAB — HM PAP SMEAR: HM Pap smear: NORMAL

## 2014-03-07 ENCOUNTER — Telehealth: Payer: Self-pay | Admitting: *Deleted

## 2014-03-07 DIAGNOSIS — E785 Hyperlipidemia, unspecified: Secondary | ICD-10-CM

## 2014-03-07 DIAGNOSIS — D696 Thrombocytopenia, unspecified: Secondary | ICD-10-CM

## 2014-03-07 DIAGNOSIS — Z79899 Other long term (current) drug therapy: Secondary | ICD-10-CM

## 2014-03-07 NOTE — Telephone Encounter (Signed)
Pt is coming in tomorrow what labs and dx?  

## 2014-03-08 ENCOUNTER — Ambulatory Visit (INDEPENDENT_AMBULATORY_CARE_PROVIDER_SITE_OTHER): Payer: Medicare Other | Admitting: *Deleted

## 2014-03-08 ENCOUNTER — Other Ambulatory Visit (INDEPENDENT_AMBULATORY_CARE_PROVIDER_SITE_OTHER): Payer: Medicare Other

## 2014-03-08 DIAGNOSIS — D696 Thrombocytopenia, unspecified: Secondary | ICD-10-CM

## 2014-03-08 DIAGNOSIS — E785 Hyperlipidemia, unspecified: Secondary | ICD-10-CM

## 2014-03-08 DIAGNOSIS — M810 Age-related osteoporosis without current pathological fracture: Secondary | ICD-10-CM

## 2014-03-08 DIAGNOSIS — Z79899 Other long term (current) drug therapy: Secondary | ICD-10-CM

## 2014-03-08 LAB — COMPREHENSIVE METABOLIC PANEL
ALT: 28 U/L (ref 0–35)
AST: 29 U/L (ref 0–37)
Albumin: 3.7 g/dL (ref 3.5–5.2)
Alkaline Phosphatase: 73 U/L (ref 39–117)
BUN: 25 mg/dL — ABNORMAL HIGH (ref 6–23)
CO2: 29 mEq/L (ref 19–32)
Calcium: 9.2 mg/dL (ref 8.4–10.5)
Chloride: 105 mEq/L (ref 96–112)
Creatinine, Ser: 0.8 mg/dL (ref 0.4–1.2)
GFR: 79.2 mL/min (ref >60.00)
Glucose, Bld: 88 mg/dL (ref 70–99)
Potassium: 4.1 mEq/L (ref 3.5–5.1)
Sodium: 140 mEq/L (ref 135–145)
Total Bilirubin: 0.4 mg/dL (ref 0.3–1.2)
Total Protein: 6.2 g/dL (ref 6.0–8.3)

## 2014-03-08 LAB — CBC WITH DIFFERENTIAL/PLATELET
Basophils Absolute: 0 10*3/uL (ref 0.0–0.1)
Basophils Relative: 0.6 % (ref 0.0–3.0)
Eosinophils Absolute: 0.2 10*3/uL (ref 0.0–0.7)
Eosinophils Relative: 4.5 % (ref 0.0–5.0)
HCT: 36.2 % (ref 36.0–46.0)
Hemoglobin: 12.3 g/dL (ref 12.0–15.0)
Lymphocytes Relative: 29.8 % (ref 12.0–46.0)
Lymphs Abs: 1.6 10*3/uL (ref 0.7–4.0)
MCHC: 34.1 g/dL (ref 30.0–36.0)
MCV: 91.1 fl (ref 78.0–100.0)
Monocytes Absolute: 0.3 10*3/uL (ref 0.1–1.0)
Monocytes Relative: 5.8 % (ref 3.0–12.0)
Neutro Abs: 3.1 10*3/uL (ref 1.4–7.7)
Neutrophils Relative %: 59.3 % (ref 43.0–77.0)
Platelets: 139 10*3/uL — ABNORMAL LOW (ref 150.0–400.0)
RBC: 3.97 Mil/uL (ref 3.87–5.11)
RDW: 13.8 % (ref 11.5–14.6)
WBC: 5.3 10*3/uL (ref 4.5–10.5)

## 2014-03-08 LAB — LIPID PANEL
Cholesterol: 190 mg/dL (ref 0–200)
HDL: 65.6 mg/dL (ref >39.00)
LDL Cholesterol: 115 mg/dL — ABNORMAL HIGH (ref 0–99)
Total CHOL/HDL Ratio: 3
Triglycerides: 49 mg/dL (ref 0.0–149.0)
VLDL: 9.8 mg/dL (ref 0.0–40.0)

## 2014-03-08 MED ORDER — DENOSUMAB 60 MG/ML ~~LOC~~ SOLN
60.0000 mg | Freq: Once | SUBCUTANEOUS | Status: AC
  Administered 2014-03-08: 60 mg via SUBCUTANEOUS

## 2014-03-09 ENCOUNTER — Telehealth: Payer: Self-pay | Admitting: Internal Medicine

## 2014-03-09 ENCOUNTER — Encounter: Payer: Self-pay | Admitting: Internal Medicine

## 2014-03-10 ENCOUNTER — Encounter: Payer: Self-pay | Admitting: Internal Medicine

## 2014-03-16 NOTE — Telephone Encounter (Signed)
Rose, can you please check the status of this prolia I had sent to [facility] before she left.

## 2014-03-24 NOTE — Telephone Encounter (Signed)
I have spoken w/Slavka at [facility] and she says patient's Prolia is covered at 100%.

## 2014-04-02 ENCOUNTER — Encounter: Payer: Self-pay | Admitting: Internal Medicine

## 2014-04-02 DIAGNOSIS — F411 Generalized anxiety disorder: Secondary | ICD-10-CM

## 2014-04-02 DIAGNOSIS — Z7189 Other specified counseling: Secondary | ICD-10-CM

## 2014-04-20 ENCOUNTER — Ambulatory Visit (INDEPENDENT_AMBULATORY_CARE_PROVIDER_SITE_OTHER): Payer: Medicare Other | Admitting: Psychology

## 2014-04-20 DIAGNOSIS — F4323 Adjustment disorder with mixed anxiety and depressed mood: Secondary | ICD-10-CM

## 2014-04-24 NOTE — Telephone Encounter (Signed)
Patient had shot on 03/08/14.

## 2014-05-11 ENCOUNTER — Ambulatory Visit (INDEPENDENT_AMBULATORY_CARE_PROVIDER_SITE_OTHER): Payer: Medicare Other | Admitting: Psychology

## 2014-05-11 DIAGNOSIS — F4323 Adjustment disorder with mixed anxiety and depressed mood: Secondary | ICD-10-CM

## 2014-05-20 ENCOUNTER — Ambulatory Visit: Payer: Self-pay | Admitting: Oncology

## 2014-08-01 ENCOUNTER — Encounter: Payer: Self-pay | Admitting: Internal Medicine

## 2014-08-01 ENCOUNTER — Ambulatory Visit (INDEPENDENT_AMBULATORY_CARE_PROVIDER_SITE_OTHER): Payer: Medicare Other | Admitting: Internal Medicine

## 2014-08-01 VITALS — BP 102/60 | HR 45 | Temp 97.6°F | Resp 14 | Ht 65.9 in | Wt 140.5 lb

## 2014-08-01 DIAGNOSIS — E785 Hyperlipidemia, unspecified: Secondary | ICD-10-CM

## 2014-08-01 DIAGNOSIS — Z79899 Other long term (current) drug therapy: Secondary | ICD-10-CM

## 2014-08-01 DIAGNOSIS — M810 Age-related osteoporosis without current pathological fracture: Secondary | ICD-10-CM

## 2014-08-01 DIAGNOSIS — F341 Dysthymic disorder: Secondary | ICD-10-CM

## 2014-08-01 DIAGNOSIS — Z23 Encounter for immunization: Secondary | ICD-10-CM

## 2014-08-01 DIAGNOSIS — R5381 Other malaise: Secondary | ICD-10-CM

## 2014-08-01 DIAGNOSIS — Z8249 Family history of ischemic heart disease and other diseases of the circulatory system: Secondary | ICD-10-CM

## 2014-08-01 DIAGNOSIS — E559 Vitamin D deficiency, unspecified: Secondary | ICD-10-CM

## 2014-08-01 DIAGNOSIS — R5383 Other fatigue: Secondary | ICD-10-CM

## 2014-08-01 DIAGNOSIS — Z823 Family history of stroke: Secondary | ICD-10-CM

## 2014-08-01 DIAGNOSIS — D696 Thrombocytopenia, unspecified: Secondary | ICD-10-CM

## 2014-08-01 DIAGNOSIS — F418 Other specified anxiety disorders: Secondary | ICD-10-CM

## 2014-08-01 LAB — CBC WITH DIFFERENTIAL/PLATELET
Basophils Absolute: 0 10*3/uL (ref 0.0–0.1)
Basophils Relative: 0.6 % (ref 0.0–3.0)
Eosinophils Absolute: 0.1 10*3/uL (ref 0.0–0.7)
Eosinophils Relative: 2 % (ref 0.0–5.0)
HCT: 38.8 % (ref 36.0–46.0)
Hemoglobin: 13 g/dL (ref 12.0–15.0)
Lymphocytes Relative: 33.6 % (ref 12.0–46.0)
Lymphs Abs: 1.7 10*3/uL (ref 0.7–4.0)
MCHC: 33.5 g/dL (ref 30.0–36.0)
MCV: 92.4 fl (ref 78.0–100.0)
Monocytes Absolute: 0.3 10*3/uL (ref 0.1–1.0)
Monocytes Relative: 6.5 % (ref 3.0–12.0)
Neutro Abs: 2.9 10*3/uL (ref 1.4–7.7)
Neutrophils Relative %: 57.3 % (ref 43.0–77.0)
Platelets: 135 10*3/uL — ABNORMAL LOW (ref 150.0–400.0)
RBC: 4.2 Mil/uL (ref 3.87–5.11)
RDW: 13.8 % (ref 11.5–15.5)
WBC: 5.1 10*3/uL (ref 4.0–10.5)

## 2014-08-01 LAB — COMPREHENSIVE METABOLIC PANEL
ALT: 20 U/L (ref 0–35)
AST: 22 U/L (ref 0–37)
Albumin: 4.1 g/dL (ref 3.5–5.2)
Alkaline Phosphatase: 52 U/L (ref 39–117)
BUN: 24 mg/dL — ABNORMAL HIGH (ref 6–23)
CO2: 29 mEq/L (ref 19–32)
Calcium: 9.3 mg/dL (ref 8.4–10.5)
Chloride: 105 mEq/L (ref 96–112)
Creatinine, Ser: 0.8 mg/dL (ref 0.4–1.2)
GFR: 77.93 mL/min (ref >60.00)
Glucose, Bld: 90 mg/dL (ref 70–99)
Potassium: 4.2 mEq/L (ref 3.5–5.1)
Sodium: 139 mEq/L (ref 135–145)
Total Bilirubin: 0.8 mg/dL (ref 0.2–1.2)
Total Protein: 6.7 g/dL (ref 6.0–8.3)

## 2014-08-01 LAB — LIPID PANEL
Cholesterol: 198 mg/dL (ref 0–200)
HDL: 68.2 mg/dL (ref >39.00)
LDL Cholesterol: 119 mg/dL — ABNORMAL HIGH (ref 0–99)
NonHDL: 129.8
Total CHOL/HDL Ratio: 3
Triglycerides: 56 mg/dL (ref 0.0–149.0)
VLDL: 11.2 mg/dL (ref 0.0–40.0)

## 2014-08-01 LAB — VITAMIN D 25 HYDROXY (VIT D DEFICIENCY, FRACTURES): VITD: 50.94 ng/mL (ref 30.00–100.00)

## 2014-08-01 LAB — TSH: TSH: 2.12 u[IU]/mL (ref 0.35–4.50)

## 2014-08-01 MED ORDER — BUTALBITAL-APAP-CAFFEINE 50-325-40 MG PO TABS: ORAL_TABLET | ORAL | Status: DC

## 2014-08-01 NOTE — Progress Notes (Signed)
Patient ID: [name] [name], female   DOB: [birth year], 73 y.o.   MRN: [id number]   Patient Active Problem List   Diagnosis Date Noted  . Visit for preventive health examination 01/10/2014  . Diverticulosis of colon without hemorrhage 01/09/2014  . Encounter for screening colonoscopy 12/07/2013  . Thrombocytopenia, unspecified 02/04/2013  . IBS (irritable bowel syndrome) 12/31/2012  . History of asbestos exposure 06/27/2012  . Osteoporosis of forearm 06/27/2012  . Anxiety associated with depression 06/25/2012  . Cataract extraction status of right eye 06/25/2012  . RBBB 05/05/2012  . Hyperlipidemia 08/06/2011  . Mitral valve prolapse 05/02/2011  . Palpitations 05/02/2011  . Supraventricular tachycardia 05/02/2011    Subjective:  CC:   Chief Complaint  Patient presents with  . Follow-up    6 month follow up, needs to follow up on prolia.    HPI:   [name] [name] is a 73 y.o. female who presents for Follow up on multiple chronic medical issues.   Jaw pain.  Her history of fractured Jaw still a problem due to pain and decreased ROM.   Her second CT at [facility] had no change per patient.  She was Given 5 options, 4 of which include refracturing jaw,  Removing teeth , orthodontics,,  Or "do nothing" She prefers the latter for now, as she is able to chew  Most things.  Saw speech pathology, found that if she stretched a ligement in her right cheek it loosened her jaw.   2) osteoporosis: She started taking prolia with her first dose in April is her next is due in October  3) FH of cerebral aneurysm.  Her brother died at 71 unexpectedly after suffering a cerebral hemorrhage that was presumed to be due to an undiagnosed aneurysmal rupture.  Hre had no history of hypertension. Atrial fibrillation or tobacco abuse.  Her father had a hemorrhagic CVA due to history of an aneurysm  3) Eye pain: Patient had a sharp pain behind her right eye recently,  which was not accompanied by vision  changes. It resolved spontaneously but the following day it recurred. She has an eye appt this week.  Has history of cataract surgery right eye         Past Medical History  Diagnosis Date  . SVT (supraventricular tachycardia)   . MVP (mitral valve prolapse)   . Hyperlipidemia   . Idiopathic thrombocytopenic purpura (ITP)   . Hemorrhoid     Past Surgical History  Procedure Laterality Date  . Tubal ligation    . Foot surgery    . Varicose vein surgery    . Mandible fracture surgery  03-26-13  . Colonoscopy  2003  . Eye surgery Right 2013       The following portions of the patient's history were reviewed and updated as appropriate: Allergies, current medications, and problem list.    Review of Systems:   Patient denies headache, fevers, malaise, unintentional weight loss, skin rash, eye pain, sinus congestion and sinus pain, sore throat, dysphagia,  hemoptysis , cough, dyspnea, wheezing, chest pain, palpitations, orthopnea, edema, abdominal pain, nausea, melena, diarrhea, constipation, flank pain, dysuria, hematuria, urinary  Frequency, nocturia, numbness, tingling, seizures,  Focal weakness, Loss of consciousness,  Tremor, insomnia, depression, anxiety, and suicidal ideation.     History   Social History  . Marital Status: Married    Spouse Name: Deceased    Number of Children: N/A  . Years of Education: 16   Occupational History  .  Not on file.   Social History Main Topics  . Smoking status: Never Smoker   . Smokeless tobacco: Never Used  . Alcohol Use: 1.5 oz/week    3 drink(s) per week     Comment: occasionally  . Drug Use: No  . Sexual Activity: No   Other Topics Concern  . Not on file   Social History Narrative   She is a widow.  Husband died last year from metastatic renal cell cancer    Objective:  Filed Vitals:   08/01/14 0936  BP: 102/60  Pulse: 45  Temp: 97.6 F (36.4 C)  Resp: 14     General appearance: alert, cooperative and  appears stated age Ears: normal TM's and external ear canals both ears Throat: lips, mucosa, and tongue normal; teeth and gums normal Neck: no adenopathy, no carotid bruit, supple, symmetrical, trachea midline and thyroid not enlarged, symmetric, no tenderness/mass/nodules Back: symmetric, no curvature. ROM normal. No CVA tenderness. Lungs: clear to auscultation bilaterally Heart: regular rate and rhythm, S1, S2 normal, no murmur, click, rub or gallop Abdomen: soft, non-tender; bowel sounds normal; no masses,  no organomegaly Pulses: 2+ and symmetric Skin: Skin color, texture, turgor normal. No rashes or lesions Lymph nodes: Cervical, supraclavicular, and axillary nodes normal.  Assessment and Plan:  Osteoporosis of forearm With history of traumatic fracture of jaw and T scores .  She is s/p Post 5 yrs of b/p.  Received her first  Prolia  dose in April .  Next dose is due in October.  Covered by Medicare  part B   Thrombocytopenia, unspecified No change with suspension of crestor. Platelets are stable.   Lab Results  Component Value Date   WBC 5.1 08/01/2014   HGB 13.0 08/01/2014   HCT 38.8 08/01/2014   MCV 92.4 08/01/2014   PLT 135.0* 08/01/2014     Hyperlipidemia LDL and triglycerides are at goal on current medications. sHe has no side effects and liver enzymes are normal. No changes today   Lab Results  Component Value Date   CHOL 198 08/01/2014   HDL 68.20 08/01/2014   LDLCALC 119* 08/01/2014   LDLDIRECT 132.5 03/25/2013   TRIG 56.0 08/01/2014   CHOLHDL 3 08/01/2014   Lab Results  Component Value Date   ALT 20 08/01/2014   AST 22 08/01/2014   ALKPHOS 52 08/01/2014   BILITOT 0.8 08/01/2014     Anxiety associated with depression She has recurrent symptoms which recur when medication is temporarily stopped.  Continue current medications. The risks and benefits of benzodiazepine use were discussed with patient today including excessive sedation leading to respiratory  depression,  impaired thinking/driving, and addiction.  Patient was advised to avoid concurrent use with alcohol, to use medication only as needed and not to share with others  .   Family history of cerebral aneurysm Reassurance that her eye pain is unlikely to be due to aneurysm/   Updated Medication List Outpatient Encounter Prescriptions as of 08/01/2014  Medication Sig  . ALPRAZolam (XANAX) 0.5 MG tablet Take 1 tablet (0.5 mg total) by mouth at bedtime as needed for anxiety or sleep.  Marland Kitchen aspirin-acetaminophen-caffeine (EXCEDRIN MIGRAINE) 250-250-65 MG per tablet Take 1 tablet by mouth every 6 (six) hours as needed.    Marland Kitchen b complex vitamins tablet Take 1 tablet by mouth daily.    . butalbital-acetaminophen-caffeine (FIORICET, ESGIC) 50-325-40 MG per tablet TAKE 1 TABLET AS NEEDED FOR BACK PAIN OR HEADACHE  . calcium citrate (CALCITRATE - DOSED  IN MG ELEMENTAL CALCIUM) 950 MG tablet Take 200 mg of elemental calcium by mouth 3 (three) times daily with meals.  . Cholecalciferol (VITAMIN D) 1000 UNITS capsule Take 1,000 Units by mouth daily.    Marland Kitchen EPIPEN 2-PAK 0.3 MG/0.3ML DEVI   . Ibuprofen (MOTRIN PO) Take by mouth as needed.    . montelukast (SINGULAIR) 10 MG tablet Take 10 mg by mouth at bedtime.    . Multiple Vitamin (MULTIVITAMIN) tablet Take 1 tablet by mouth daily.    . NON FORMULARY citrocel as needed.  . Omega-3 Fatty Acids (FISH OIL PO) Take by mouth daily.    . Probiotic Product (PROBIOTIC PO) Take by mouth daily.    . rosuvastatin (CRESTOR) 5 MG tablet Take 1 tablet (5 mg total) by mouth every other day.  . vitamin C (ASCORBIC ACID) 500 MG tablet Take 500 mg by mouth daily.    . vitamin E 100 UNIT capsule Take 400 Units by mouth daily.    . [DISCONTINUED] butalbital-acetaminophen-caffeine (FIORICET, ESGIC) 50-325-40 MG per tablet TAKE 1 TABLET AS NEEDED FOR BACK PAIN OR HEADACHE  . [DISCONTINUED] polyethylene glycol powder (GLYCOLAX/MIRALAX) powder 255 grams one bottle for  colonoscopy prep

## 2014-08-01 NOTE — Patient Instructions (Addendum)
You are doing well.  yoru labs were normal in [place];  You do not need any more labs until  [facility] when you return for labs   I recommend Dr Cy Blamer  from [facility]  I recommend getting the majority of your calcium and Vitamin D  through diet rather than supplements given the recent association of calcium supplements with increased coronary artery calcium scores (You need 1200 mg daily )    almond   milk is a great low calorie low carb, cholesterol free  way to increase your dietary calcium and vitamin D.  Try the blue Diamond  brand

## 2014-08-01 NOTE — Progress Notes (Signed)
Pre-visit discussion using our clinic review tool. No additional management support is needed unless otherwise documented below in the visit note.  

## 2014-08-01 NOTE — Assessment & Plan Note (Addendum)
With history of traumatic fracture of jaw and T scores .  She is s/p Post 5 yrs of b/p.  Received her first  Prolia  dose in April .  Next dose is due in October.  Covered by Medicare  part B

## 2014-08-02 ENCOUNTER — Encounter: Payer: Self-pay | Admitting: Internal Medicine

## 2014-08-02 ENCOUNTER — Telehealth: Payer: Self-pay | Admitting: Internal Medicine

## 2014-08-02 DIAGNOSIS — Z8249 Family history of ischemic heart disease and other diseases of the circulatory system: Secondary | ICD-10-CM | POA: Insufficient documentation

## 2014-08-02 NOTE — Assessment & Plan Note (Signed)
Reassurance that her eye pain is unlikely to be due to aneurysm/

## 2014-08-02 NOTE — Assessment & Plan Note (Signed)
LDL and triglycerides are at goal on current medications. sHe has no side effects and liver enzymes are normal. No changes today   Lab Results  Component Value Date   CHOL 198 08/01/2014   HDL 68.20 08/01/2014   LDLCALC 119* 08/01/2014   LDLDIRECT 132.5 03/25/2013   TRIG 56.0 08/01/2014   CHOLHDL 3 08/01/2014   Lab Results  Component Value Date   ALT 20 08/01/2014   AST 22 08/01/2014   ALKPHOS 52 08/01/2014   BILITOT 0.8 08/01/2014

## 2014-08-02 NOTE — Assessment & Plan Note (Signed)
No change with suspension of crestor. Platelets are stable.   Lab Results  Component Value Date   WBC 5.1 08/01/2014   HGB 13.0 08/01/2014   HCT 38.8 08/01/2014   MCV 92.4 08/01/2014   PLT 135.0* 08/01/2014

## 2014-08-02 NOTE — Assessment & Plan Note (Signed)
She has recurrent symptoms which recur when medication is temporarily stopped.  Continue current medications. The risks and benefits of benzodiazepine use were discussed with patient today including excessive sedation leading to respiratory depression,  impaired thinking/driving, and addiction.  Patient was advised to avoid concurrent use with alcohol, to use medication only as needed and not to share with others  .

## 2014-08-02 NOTE — Telephone Encounter (Signed)
[name] [name] will be due for her 6 month Prolia injection next month.  Do  Have to ut in an order every time or is it automatic?  Can you make sure we are getting it?

## 2014-08-03 NOTE — Telephone Encounter (Signed)
We are supposed to verify benefits each time and I have sent Ms. Heaps info electronically to [employer]. I will notify you as soon as I have a response. Thank you.

## 2014-08-04 NOTE — Telephone Encounter (Signed)
I have received [name] [name]' insurance verification for her Prolia injection. Her estimated responsibility of $0 plus any applicable deductible. Please make pt aware that this is an estimate and we won't know an exact amt until both of her insurances have paid. I have sent a copy of the summary of benefits to be scanned into pt's chart. Please let me know if you have any questions. Thank you.

## 2014-08-04 NOTE — Telephone Encounter (Signed)
Thank you  !   Now do you how does the actual  Medication get ordered so that it arrives?

## 2014-08-05 NOTE — Telephone Encounter (Signed)
Need Prolia ordered Thanks.

## 2014-08-05 NOTE — Telephone Encounter (Signed)
You're welcome!  Someone at your office orders it. I'm not sure who that person is though.  I would ask either Larene Beach or maybe Juliann Pulse.

## 2014-08-09 NOTE — Telephone Encounter (Signed)
Ordered. Pt has appt scheduled 09/13/14

## 2014-09-02 ENCOUNTER — Ambulatory Visit (INDEPENDENT_AMBULATORY_CARE_PROVIDER_SITE_OTHER): Payer: Medicare Other | Admitting: Cardiovascular Disease

## 2014-09-02 ENCOUNTER — Ambulatory Visit: Payer: Medicare Other | Admitting: Cardiovascular Disease

## 2014-09-02 ENCOUNTER — Encounter: Payer: Self-pay | Admitting: Cardiovascular Disease

## 2014-09-02 VITALS — BP 109/69 | HR 69 | Ht 66.0 in | Wt 142.5 lb

## 2014-09-02 DIAGNOSIS — E785 Hyperlipidemia, unspecified: Secondary | ICD-10-CM

## 2014-09-02 DIAGNOSIS — I341 Nonrheumatic mitral (valve) prolapse: Secondary | ICD-10-CM

## 2014-09-02 DIAGNOSIS — I471 Supraventricular tachycardia: Secondary | ICD-10-CM

## 2014-09-02 DIAGNOSIS — I4510 Unspecified right bundle-branch block: Secondary | ICD-10-CM

## 2014-09-02 MED ORDER — ROSUVASTATIN CALCIUM 5 MG PO TABS: 5.0000 mg | ORAL_TABLET | Freq: Every day | ORAL | Status: DC

## 2014-09-02 NOTE — Progress Notes (Signed)
[name] [name] Date of Birth  [birth year]       [facility] Office [address], [address], [place], [place]  [address]   [place], [place]  [address] [phone]     [phone]   Fax  [phone]    Fax [phone]  Problem List: 1. Supraventricular tachycardia 2. Mitral valve prolapse 3. Cataracts  History of Present Illness:  Farhiya is a  73 yo with hx of SVT and MVP.  She needs to have cataract surgery and is hear for pre op evaluation.  She has done well.  She has not been exercising as much as she would like.  She has not had any syncope or presyncope.  She has been taking care of her granddaughter who has ADD and a sleep disorder.  Regina did not get much sleep that weekend and had lots of palpitations for the following week.  Oct. 7, 2014  Anhthu is seen after a 1 1/2 year absence.  She has had some fatigue.   Dr. Derrel Nip has found thrombocytopenia.  Crestor was stopped but this did not help the thrombocytopenia.   She has seen a hemotologist.    She fell  (caught her toe at a [employer] - not syncope) several months ago and had reconstruction of her jaw.  She also broke her left wrist.     Oct. 2, 2015 :  Omaya is doing well from a cardiac standpoint.  Her cholesterol levels are slightly elevated ( even on Crestor 5 mg a day).  She has had marginally low platelets .  Her cholesterol levels are still minimally elevated.    She had some dyspnea this summer.  Currently , she has no limitations.  She thinks the symptoms may have been due to allergies.  No chest pain.    She has had several falls this year .  She thinks that her stamina is not as good as it should be.  She does yoga but no cardio workouts.    Current Outpatient Prescriptions on File Prior to Visit  Medication Sig Dispense Refill  . ALPRAZolam (XANAX) 0.5 MG tablet Take 1 tablet (0.5 mg total) by mouth at bedtime as needed for anxiety or sleep.  30  tablet  3  . aspirin-acetaminophen-caffeine (EXCEDRIN MIGRAINE) 027-253-66 MG per tablet Take 1 tablet by mouth every 6 (six) hours as needed.        Marland Kitchen b complex vitamins tablet Take 1 tablet by mouth daily.        . butalbital-acetaminophen-caffeine (FIORICET, ESGIC) 50-325-40 MG per tablet TAKE 1 TABLET AS NEEDED FOR BACK PAIN OR HEADACHE  90 tablet  0  . calcium citrate (CALCITRATE - DOSED IN MG ELEMENTAL CALCIUM) 950 MG tablet Take 200 mg of elemental calcium by mouth 3 (three) times daily with meals.      . Cholecalciferol (VITAMIN D) 1000 UNITS capsule Take 1,000 Units by mouth daily.        Marland Kitchen EPIPEN 2-PAK 0.3 MG/0.3ML DEVI       . Ibuprofen (MOTRIN PO) Take by mouth as needed.        . montelukast (SINGULAIR) 10 MG tablet Take 10 mg by mouth at bedtime.        . Multiple Vitamin (MULTIVITAMIN) tablet Take 1 tablet by mouth daily.        . NON FORMULARY citrocel as needed.      . Omega-3  Fatty Acids (FISH OIL PO) Take by mouth daily.        . Probiotic Product (PROBIOTIC PO) Take by mouth daily.        . rosuvastatin (CRESTOR) 5 MG tablet Take 1 tablet (5 mg total) by mouth every other day.  30 tablet  3  . vitamin C (ASCORBIC ACID) 500 MG tablet Take 500 mg by mouth daily.        . vitamin E 100 UNIT capsule Take 400 Units by mouth daily.         No current facility-administered medications on file prior to visit.    Allergies  Allergen Reactions  . Codeine Other (See Comments)    migraine  . Doxycycline Itching  . Hydrocodone Nausea And Vomiting  . Hydromorphone Nausea And Vomiting  . Meloxicam   . Oxycodone     Past Medical History  Diagnosis Date  . SVT (supraventricular tachycardia)   . MVP (mitral valve prolapse)   . Hyperlipidemia   . Idiopathic thrombocytopenic purpura (ITP)   . Hemorrhoid     Past Surgical History  Procedure Laterality Date  . Tubal ligation    . Foot surgery    . Varicose vein surgery    . Mandible fracture surgery  03-26-13  . Colonoscopy   2003  . Eye surgery Right 2013    History  Smoking status  . Never Smoker   Smokeless tobacco  . Never Used    History  Alcohol Use  . 1.5 oz/week  . 3 drink(s) per week    Comment: occasionally    Family History  Problem Relation Age of Onset  . Hypertension Mother   . Arrhythmia Mother   . Heart failure Mother   . Arrhythmia Brother   . Stroke Brother 53    cerebral hemorrhage, nonsmoker, no HTN  . Stroke Father     from an aneurysm  . Stroke Maternal Aunt 83    cerebral hemorrhage    Reviw of Systems:  Reviewed in the HPI.  All other systems are negative.  Physical Exam: Blood pressure 109/69, pulse 69, height 5\' 6"  (1.676 m), weight 142 lb 8 oz (64.638 kg). General: Well developed, well nourished, in no acute distress. Head: Normocephalic, atraumatic, sclera non-icteric, mucus membranes are moist,  Neck: Supple. Carotids are 2 + without bruits. No JVD Lungs: Clear bilaterally to auscultation. Heart: regular rate.  normal  S1 S2. She has a mid- late systolic click. There is a 2/6 systolic ejection murmur at the axilla.  She has occasional PVCs. Abdomen: Soft, non-tender, non-distended with normal bowel sounds. No hepatomegaly. No rebound/guarding. No masses. Msk:  Strength and tone are normal Extremities: No clubbing or cyanosis. No edema.  Distal pedal pulses are 2+ and equal bilaterally. Neuro: Alert and oriented X 3. Moves all extremities spontaneously. Psych:  Responds to questions appropriately with a normal affect.  ECG: 09/02/2014: Normal sinus rhythm at 69. She has right bundle branch block. There are associated ST segment changes.  Assessment / Plan:

## 2014-09-02 NOTE — Patient Instructions (Addendum)
Your physician has recommended you make the following change in your medication:  Increase Crestor to 5 mg once daily   Your physician recommends that you return for lab work in:  3 months  Lipid and Liver Panel  BMP   Your physician wants you to follow-up in: 1 year with Dr. Acie Fredrickson. You will receive a reminder letter in the mail two months in advance. If you don't receive a letter, please call our office to schedule the follow-up appointment.

## 2014-09-02 NOTE — Assessment & Plan Note (Signed)
Her last lipid levels are mildly elevated. We will increase her Crestor to 5 mg every day instead of every other day. We'll recheck her lipids, liver enzymes and basic metabolic profile in 3 months.  See her again in one year

## 2014-09-02 NOTE — Assessment & Plan Note (Signed)
He seems to be well from a cardiac standpoint. Her mitral valve prolapse is stable.  He does have some lack of stamina. I suspect that she is deconditioned. She does not do any cardio exercises.  I've encouraged her to join the [facility]  or a similar gym and work out on a daily basis. She has lots of various limitations and she'll need to consult with a personal trainer to help organize an optimal exercise regimen.

## 2014-09-13 ENCOUNTER — Ambulatory Visit (INDEPENDENT_AMBULATORY_CARE_PROVIDER_SITE_OTHER): Payer: Medicare Other | Admitting: *Deleted

## 2014-09-13 DIAGNOSIS — M810 Age-related osteoporosis without current pathological fracture: Secondary | ICD-10-CM

## 2014-09-13 MED ORDER — DENOSUMAB 60 MG/ML ~~LOC~~ SOLN
60.0000 mg | Freq: Once | SUBCUTANEOUS | Status: AC
  Administered 2014-09-13: 60 mg via SUBCUTANEOUS

## 2014-10-03 ENCOUNTER — Encounter: Payer: Self-pay | Admitting: Cardiovascular Disease

## 2014-11-15 ENCOUNTER — Telehealth: Payer: Self-pay | Admitting: Internal Medicine

## 2014-11-15 DIAGNOSIS — N393 Stress incontinence (female) (male): Secondary | ICD-10-CM

## 2014-11-15 NOTE — Telephone Encounter (Signed)
Ms. Stracener called saying since having seen Dr. Derrel Nip she had a doctor's appt with her Cardiologist who recommended she go to a gym due to not having enough stamina, this caused her to again began having frequent instances of uncontrolled urinating on herself and pain. She then saw a Physical Therapist who said the exercise at the gym has weakened her bladder area. She now needs continued care with the Physical Therapist and needs a referral to continue going. Her next appt is on Dec. 21st. The Physical Therapist she sees is: Richardo Hanks with [facility] out of [place]. The office phone number is: [phone] but the doctor's cell number is: [phone]. If you have any questions or concerns, please feel free to call the pt. Pt ph# [phone] Thank you.

## 2014-11-15 NOTE — Telephone Encounter (Signed)
Does the Physical therapist need a signed order or just a verbal order?  You can give her verbal. Does patient want to see Dr Elba Barman again (she's the urogynecologist that she saw at [facility]  in 2012)

## 2014-11-16 NOTE — Telephone Encounter (Signed)
Patient needs signed order in-order for medicare to pay

## 2014-11-16 NOTE — Telephone Encounter (Signed)
Order in [facility] for PT referral

## 2014-12-05 ENCOUNTER — Other Ambulatory Visit (INDEPENDENT_AMBULATORY_CARE_PROVIDER_SITE_OTHER): Payer: Medicare Other | Admitting: *Deleted

## 2014-12-05 ENCOUNTER — Telehealth: Payer: Self-pay | Admitting: *Deleted

## 2014-12-05 DIAGNOSIS — E785 Hyperlipidemia, unspecified: Secondary | ICD-10-CM

## 2014-12-05 NOTE — Telephone Encounter (Signed)
Patient is aware that she can take her crestor every other day to help with leg cramping.

## 2014-12-05 NOTE — Telephone Encounter (Signed)
She may take the crestor every other day if that helps.

## 2014-12-05 NOTE — Telephone Encounter (Signed)
Patient came in for lab work today and mentioned that she has increased leg calf cramping since on Crestor. She wanted to inform you that she has been trying to tolerate the cramping but would like to know if she skips a day would it make a difference? Please advise.  She mentioned as well that she was recommended to start some type of exercise program by you . She had enrolled into [employer] and started having overactive bladder/accidents due to the exercises and light lifting. She is now working with a physical therapist and is doing a lot better since she's doing light walking.

## 2014-12-06 LAB — HEPATIC FUNCTION PANEL
ALT: 21 IU/L (ref 0–32)
AST: 24 IU/L (ref 0–40)
Albumin: 4 g/dL (ref 3.5–4.8)
Alkaline Phosphatase: 52 IU/L (ref 39–117)
Bilirubin, Direct: 0.11 mg/dL (ref 0.00–0.40)
Total Bilirubin: 0.4 mg/dL (ref 0.0–1.2)
Total Protein: 5.8 g/dL — ABNORMAL LOW (ref 6.0–8.5)

## 2014-12-06 LAB — BASIC METABOLIC PANEL
BUN/Creatinine Ratio: 22 (ref 11–26)
BUN: 19 mg/dL (ref 8–27)
CO2: 25 mmol/L (ref 18–29)
Calcium: 9 mg/dL (ref 8.7–10.3)
Chloride: 103 mmol/L (ref 97–108)
Creatinine, Ser: 0.87 mg/dL (ref 0.57–1.00)
GFR calc Af Amer: 76 mL/min/{1.73_m2} (ref >59)
GFR calc non Af Amer: 66 mL/min/{1.73_m2} (ref >59)
Glucose: 94 mg/dL (ref 65–99)
Potassium: 4.4 mmol/L (ref 3.5–5.2)
Sodium: 143 mmol/L (ref 134–144)

## 2014-12-06 LAB — LIPID PANEL
Chol/HDL Ratio: 2.7 ratio units (ref 0.0–4.4)
Cholesterol, Total: 162 mg/dL (ref 100–199)
HDL: 61 mg/dL (ref >39)
LDL Calculated: 85 mg/dL (ref 0–99)
Triglycerides: 82 mg/dL (ref 0–149)
VLDL Cholesterol Cal: 16 mg/dL (ref 5–40)

## 2015-01-10 ENCOUNTER — Other Ambulatory Visit: Payer: Self-pay | Admitting: Cardiovascular Disease

## 2015-01-29 ENCOUNTER — Telehealth: Payer: Medicare Other | Admitting: Physician Assistant

## 2015-01-29 DIAGNOSIS — M545 Low back pain: Secondary | ICD-10-CM

## 2015-01-29 MED ORDER — CYCLOBENZAPRINE HCL 5 MG PO TABS: 5.0000 mg | ORAL_TABLET | Freq: Three times a day (TID) | ORAL | Status: DC | PRN

## 2015-01-29 MED ORDER — CELECOXIB 100 MG PO CAPS
100.0000 mg | ORAL_CAPSULE | Freq: Two times a day (BID) | ORAL | Status: DC
Start: 2015-01-29 — End: 2015-08-15

## 2015-01-29 NOTE — Progress Notes (Signed)
We are sorry that you are not feeling well.  Here is how we plan to help!  Based on what you have shared with me it looks like you mostly have acute back pain.  Acute back pain is defined as musculoskeletal pain that can resolve in 1-3 weeks with conservative treatment.  I have prescribed Celebrex a  non-steroid anti-inflammatory (NSAID), with less risk of stomach upset than other NSAIDs, as well as Flexeril 5 mg every eight hours as needed which is a muscle relaxer.  Some patients experience stomach irritation or in increased heartburn with anti-inflammatory drugs.  Please keep in mind that muscle relaxer's can cause fatigue and should not be taken while at work or driving.  Back pain is very common.  The pain often gets better over time.  The cause of back pain is usually not dangerous.  Most people can learn to manage their back pain on their own.  If you are requesting an x-ray, you will need to go to an Urgent Care or ER, or follow-up with your Primary Medical Provider tomorrow in office.  If anything worsens, please go to the ER.  Home Care  Stay active.  Start with short walks on flat ground if you can.  Try to walk farther each day.  Do not sit, drive or stand in one place for more than 30 minutes.  Do not stay in bed.  Do not avoid exercise or work.  Activity can help your back heal faster.  Be careful when you bend or lift an object.  Bend at your knees, keep the object close to you, and do not twist.  Sleep on a firm mattress.  Lie on your side, and bend your knees.  If you lie on your back, put a pillow under your knees.  Only take medicines as told by your doctor.  Put ice on the injured area.  Put ice in a plastic bag  Place a towel between your skin and the bag  Leave the ice on for 15-20 minutes, 3-4 times a day for the first 2-3 days.  After that, you can switch between ice and heat packs.  Ask your doctor about back exercises or massage.  Avoid feeling anxious or  stressed.  Find good ways to deal with stress, such as exercise.  Get Help Right Way If:  Your pain does not go away with rest or medicine.  Your pain does not go away in 1 week.  You have new problems.  You do not feel well.  The pain spreads into your legs.  You cannot control when you poop (bowel movement) or pee (urinate)  You feel sick to your stomach (nauseous) or throw up (vomit)  You have belly (abdominal) pain.  You feel like you may pass out (faint).  If you develop a fever.  Make Sure you:  Understand these instructions.  Will watch your condition  Will get help right away if you are not doing well or get worse.  Your e-visit answers were reviewed by a board certified advanced clinical practitioner to complete your personal care plan.  Depending on the condition, your plan could have included both over the counter or prescription medications.  If there is a problem please reply  once you have received a response from your provider.  Your safety is important to [place].  If you have drug allergies check your prescription carefully.    You can use MyChart to ask questions about today's visit, request  a non-urgent call back, or ask for a work or school excuse.  You will get an e-mail in the next two days asking about your experience.  I hope that your e-visit has been valuable and will speed your recovery. Thank you for using e-visits.

## 2015-02-02 ENCOUNTER — Ambulatory Visit (INDEPENDENT_AMBULATORY_CARE_PROVIDER_SITE_OTHER): Payer: Medicare Other | Admitting: Internal Medicine

## 2015-02-02 ENCOUNTER — Telehealth: Payer: Self-pay | Admitting: *Deleted

## 2015-02-02 ENCOUNTER — Encounter: Payer: Self-pay | Admitting: Internal Medicine

## 2015-02-02 ENCOUNTER — Ambulatory Visit (INDEPENDENT_AMBULATORY_CARE_PROVIDER_SITE_OTHER)
Admission: RE | Admit: 2015-02-02 | Discharge: 2015-02-02 | Disposition: A | Discharge status: Home / Self Care | Payer: Medicare Other | Source: Ambulatory Visit | Attending: Internal Medicine | Admitting: Internal Medicine

## 2015-02-02 VITALS — BP 106/68 | HR 74 | Temp 97.7°F | Resp 16 | Ht 65.5 in | Wt 143.2 lb

## 2015-02-02 DIAGNOSIS — I499 Cardiac arrhythmia, unspecified: Secondary | ICD-10-CM

## 2015-02-02 DIAGNOSIS — N63 Unspecified lump in breast: Secondary | ICD-10-CM

## 2015-02-02 DIAGNOSIS — Z0000 Encounter for general adult medical examination without abnormal findings: Secondary | ICD-10-CM

## 2015-02-02 DIAGNOSIS — M545 Low back pain, unspecified: Secondary | ICD-10-CM

## 2015-02-02 DIAGNOSIS — E559 Vitamin D deficiency, unspecified: Secondary | ICD-10-CM

## 2015-02-02 DIAGNOSIS — Z79899 Other long term (current) drug therapy: Secondary | ICD-10-CM

## 2015-02-02 DIAGNOSIS — N6320 Unspecified lump in the left breast, unspecified quadrant: Secondary | ICD-10-CM

## 2015-02-02 DIAGNOSIS — E785 Hyperlipidemia, unspecified: Secondary | ICD-10-CM

## 2015-02-02 DIAGNOSIS — Z1159 Encounter for screening for other viral diseases: Secondary | ICD-10-CM

## 2015-02-02 DIAGNOSIS — I700 Atherosclerosis of aorta: Secondary | ICD-10-CM

## 2015-02-02 LAB — COMPREHENSIVE METABOLIC PANEL
ALT: 17 U/L (ref 0–35)
AST: 18 U/L (ref 0–37)
Albumin: 4.2 g/dL (ref 3.5–5.2)
Alkaline Phosphatase: 56 U/L (ref 39–117)
BUN: 26 mg/dL — ABNORMAL HIGH (ref 6–23)
CO2: 30 mEq/L (ref 19–32)
Calcium: 9 mg/dL (ref 8.4–10.5)
Chloride: 104 mEq/L (ref 96–112)
Creatinine, Ser: 0.85 mg/dL (ref 0.40–1.20)
GFR: 69.43 mL/min (ref >60.00)
Glucose, Bld: 93 mg/dL (ref 70–99)
Potassium: 4.4 mEq/L (ref 3.5–5.1)
Sodium: 136 mEq/L (ref 135–145)
Total Bilirubin: 0.5 mg/dL (ref 0.2–1.2)
Total Protein: 6.7 g/dL (ref 6.0–8.3)

## 2015-02-02 LAB — LIPID PANEL
Cholesterol: 176 mg/dL (ref 0–200)
HDL: 65.3 mg/dL (ref >39.00)
LDL Cholesterol: 100 mg/dL — ABNORMAL HIGH (ref 0–99)
NonHDL: 110.7
Total CHOL/HDL Ratio: 3
Triglycerides: 54 mg/dL (ref 0.0–149.0)
VLDL: 10.8 mg/dL (ref 0.0–40.0)

## 2015-02-02 LAB — VITAMIN D 25 HYDROXY (VIT D DEFICIENCY, FRACTURES): VITD: 48.85 ng/mL (ref 30.00–100.00)

## 2015-02-02 NOTE — Telephone Encounter (Signed)
Could you please start authorization? Thank you!

## 2015-02-02 NOTE — Patient Instructions (Signed)
I have ordered a "diagnostic mammogram " of you breasts bc of the left breast 'cyst" i felt today  The x rays can be done at [facility] today  Health Maintenance Adopting a healthy lifestyle and getting preventive care can go a long way to promote health and wellness. Talk with your health care provider about what schedule of regular examinations is right for you. This is a good chance for you to check in with your provider about disease prevention and staying healthy. In between checkups, there are plenty of things you can do on your own. Experts have done a lot of research about which lifestyle changes and preventive measures are most likely to keep you healthy. Ask your health care provider for more information. WEIGHT AND DIET  Eat a healthy diet  Be sure to include plenty of vegetables, fruits, low-fat dairy products, and lean protein.  Do not eat a lot of foods high in solid fats, added sugars, or salt.  Get regular exercise. This is one of the most important things you can do for your health.  Most adults should exercise for at least 150 minutes each week. The exercise should increase your heart rate and make you sweat (moderate-intensity exercise).  Most adults should also do strengthening exercises at least twice a week. This is in addition to the moderate-intensity exercise.  Maintain a healthy weight  Body mass index (BMI) is a measurement that can be used to identify possible weight problems. It estimates body fat based on height and weight. Your health care provider can help determine your BMI and help you achieve or maintain a healthy weight.  For females 34 years of age and older:   A BMI below 18.5 is considered underweight.  A BMI of 18.5 to 24.9 is normal.  A BMI of 25 to 29.9 is considered overweight.  A BMI of 30 and above is considered obese.  Watch levels of cholesterol and blood lipids  You should start having your blood tested for lipids and cholesterol  at 75 years of age, then have this test every 5 years.  You may need to have your cholesterol levels checked more often if:  Your lipid or cholesterol levels are high.  You are older than 75 years of age.  You are at high risk for heart disease.  CANCER SCREENING   Lung Cancer  Lung cancer screening is recommended for adults 46-22 years old who are at high risk for lung cancer because of a history of smoking.  A yearly low-dose CT scan of the lungs is recommended for people who:  Currently smoke.  Have quit within the past 15 years.  Have at least a 30-pack-year history of smoking. A pack year is smoking an average of one pack of cigarettes a day for 1 year.  Yearly screening should continue until it has been 15 years since you quit.  Yearly screening should stop if you develop a health problem that would prevent you from having lung cancer treatment.  Breast Cancer  Practice breast self-awareness. This means understanding how your breasts normally appear and feel.  It also means doing regular breast self-exams. Let your health care provider know about any changes, no matter how small.  If you are in your 20s or 30s, you should have a clinical breast exam (CBE) by a health care provider every 1-3 years as part of a regular health exam.  If you are 75 or older, have a CBE every year. Also consider  having a breast X-ray (mammogram) every year.  If you have a family history of breast cancer, talk to your health care provider about genetic screening.  If you are at high risk for breast cancer, talk to your health care provider about having an MRI and a mammogram every year.  Breast cancer gene (BRCA) assessment is recommended for women who have family members with BRCA-related cancers. BRCA-related cancers include:  Breast.  Ovarian.  Tubal.  Peritoneal cancers.  Results of the assessment will determine the need for genetic counseling and BRCA1 and BRCA2  testing. Cervical Cancer Routine pelvic examinations to screen for cervical cancer are no longer recommended for nonpregnant women who are considered low risk for cancer of the pelvic organs (ovaries, uterus, and vagina) and who do not have symptoms. A pelvic examination may be necessary if you have symptoms including those associated with pelvic infections. Ask your health care provider if a screening pelvic exam is right for you.   The Pap test is the screening test for cervical cancer for women who are considered at risk.  If you had a hysterectomy for a problem that was not cancer or a condition that could lead to cancer, then you no longer need Pap tests.  If you are older than 65 years, and you have had normal Pap tests for the past 10 years, you no longer need to have Pap tests.  If you have had past treatment for cervical cancer or a condition that could lead to cancer, you need Pap tests and screening for cancer for at least 20 years after your treatment.  If you no longer get a Pap test, assess your risk factors if they change (such as having a new sexual partner). This can affect whether you should start being screened again.  Some women have medical problems that increase their chance of getting cervical cancer. If this is the case for you, your health care provider may recommend more frequent screening and Pap tests.  The human papillomavirus (HPV) test is another test that may be used for cervical cancer screening. The HPV test looks for the virus that can cause cell changes in the cervix. The cells collected during the Pap test can be tested for HPV.  The HPV test can be used to screen women 57 years of age and older. Getting tested for HPV can extend the interval between normal Pap tests from three to five years.  An HPV test also should be used to screen women of any age who have unclear Pap test results.  After 75 years of age, women should have HPV testing as often as Pap  tests.  Colorectal Cancer  This type of cancer can be detected and often prevented.  Routine colorectal cancer screening usually begins at 75 years of age and continues through 75 years of age.  Your health care provider may recommend screening at an earlier age if you have risk factors for colon cancer.  Your health care provider may also recommend using home test kits to check for hidden blood in the stool.  A small camera at the end of a tube can be used to examine your colon directly (sigmoidoscopy or colonoscopy). This is done to check for the earliest forms of colorectal cancer.  Routine screening usually begins at age 10.  Direct examination of the colon should be repeated every 5-10 years through 75 years of age. However, you may need to be screened more often if early forms of precancerous  polyps or small growths are found. Skin Cancer  Check your skin from head to toe regularly.  Tell your health care provider about any new moles or changes in moles, especially if there is a change in a mole's shape or color.  Also tell your health care provider if you have a mole that is larger than the size of a pencil eraser.  Always use sunscreen. Apply sunscreen liberally and repeatedly throughout the day.  Protect yourself by wearing long sleeves, pants, a wide-brimmed hat, and sunglasses whenever you are outside. HEART DISEASE, DIABETES, AND HIGH BLOOD PRESSURE   Have your blood pressure checked at least every 1-2 years. High blood pressure causes heart disease and increases the risk of stroke.  If you are between 7 years and 6 years old, ask your health care provider if you should take aspirin to prevent strokes.  Have regular diabetes screenings. This involves taking a blood sample to check your fasting blood sugar level.  If you are at a normal weight and have a low risk for diabetes, have this test once every three years after 75 years of age.  If you are overweight and  have a high risk for diabetes, consider being tested at a younger age or more often. PREVENTING INFECTION  Hepatitis B  If you have a higher risk for hepatitis B, you should be screened for this virus. You are considered at high risk for hepatitis B if:  You were born in a country where hepatitis B is common. Ask your health care provider which countries are considered high risk.  Your parents were born in a high-risk country, and you have not been immunized against hepatitis B (hepatitis B vaccine).  You have HIV or AIDS.  You use needles to inject street drugs.  You live with someone who has hepatitis B.  You have had sex with someone who has hepatitis B.  You get hemodialysis treatment.  You take certain medicines for conditions, including cancer, organ transplantation, and autoimmune conditions. Hepatitis C  Blood testing is recommended for:  Everyone born from [birth year] through 1966.  Anyone with known risk factors for hepatitis C. Sexually transmitted infections (STIs)  You should be screened for sexually transmitted infections (STIs) including gonorrhea and chlamydia if:  You are sexually active and are younger than 74 years of age.  You are older than 75 years of age and your health care provider tells you that you are at risk for this type of infection.  Your sexual activity has changed since you were last screened and you are at an increased risk for chlamydia or gonorrhea. Ask your health care provider if you are at risk.  If you do not have HIV, but are at risk, it may be recommended that you take a prescription medicine daily to prevent HIV infection. This is called pre-exposure prophylaxis (PrEP). You are considered at risk if:  You are sexually active and do not regularly use condoms or know the HIV status of your partner(s).  You take drugs by injection.  You are sexually active with a partner who has HIV. Talk with your health care provider about whether you  are at high risk of being infected with HIV. If you choose to begin PrEP, you should first be tested for HIV. You should then be tested every 3 months for as long as you are taking PrEP.  PREGNANCY   If you are premenopausal and you may become pregnant, ask your health care provider  about preconception counseling.  If you may become pregnant, take 400 to 800 micrograms (mcg) of folic acid every day.  If you want to prevent pregnancy, talk to your health care provider about birth control (contraception). OSTEOPOROSIS AND MENOPAUSE   Osteoporosis is a disease in which the bones lose minerals and strength with aging. This can result in serious bone fractures. Your risk for osteoporosis can be identified using a bone density scan.  If you are 71 years of age or older, or if you are at risk for osteoporosis and fractures, ask your health care provider if you should be screened.  Ask your health care provider whether you should take a calcium or vitamin D supplement to lower your risk for osteoporosis.  Menopause may have certain physical symptoms and risks.  Hormone replacement therapy may reduce some of these symptoms and risks. Talk to your health care provider about whether hormone replacement therapy is right for you.  HOME CARE INSTRUCTIONS   Schedule regular health, dental, and eye exams.  Stay current with your immunizations.   Do not use any tobacco products including cigarettes, chewing tobacco, or electronic cigarettes.  If you are pregnant, do not drink alcohol.  If you are breastfeeding, limit how much and how often you drink alcohol.  Limit alcohol intake to no more than 1 drink per day for nonpregnant women. One drink equals 12 ounces of beer, 5 ounces of wine, or 1 ounces of hard liquor.  Do not use street drugs.  Do not share needles.  Ask your health care provider for help if you need support or information about quitting drugs.  Tell your health care provider if  you often feel depressed.  Tell your health care provider if you have ever been abused or do not feel safe at home. Document Released: 06/03/2011 Document Revised: 04/04/2014 Document Reviewed: 10/20/2013 [facility] Patient Information 2015 [place], [place]. This information is not intended to replace advice given to you by your health care provider. Make sure you discuss any questions you have with your health care provider.

## 2015-02-02 NOTE — Telephone Encounter (Signed)
-----   Message from [name], MD sent at 02/02/2015  9:58 AM EST ----- Regarding: Prolia Patient is due for Prolia injection on or after  April 13th Please order and call patient when received.  Thanks  TT

## 2015-02-02 NOTE — Progress Notes (Signed)
Pre-visit discussion using our clinic review tool. No additional management support is needed unless otherwise documented below in the visit note.  

## 2015-02-02 NOTE — Progress Notes (Signed)
Patient ID: [name] [name], female   DOB: [birth year], 74 y.o.   MRN: [id number]  The patient is here for annual Medicare wellness examination and management of other chronic and acute problems:  1) She had an episode of severe incapacitating right sided back pain for 48 hours over the weekend, occurred without trauma or unusual activity except for some gardening.  Did not respond to one dose of 600 mg motrin followed by episode of  nausea and vomiting ,  Laid on the floor for most of the weekend. And used fioricet .  Finally  Had an  E visit  On Saturday at which time she was prescribed flexeril and celebrex.  Patient self prescribed a lumbar brace.  which by Tuesday started to help a little .  Had done gardening the day before digging up day lillies.  digding was done standing up using a long handled shovel  She has a has history of scoliosis and osteoporosis, treated with semi annual Prolia injections and is due for next Prolia injection on or after April 13.   no prior fractures.    Doing yoga twice a week. Started using a Physiological scientist at fitness center to build stamina but the quad exercises were aggravating her urinary incontinence so her therapy has been changed.    2) Atherosclerosis:  She had a Health screening done at [facility] in October  noted athersoclerosis of aorta .  Seen on chest x ray.  Her Crestor dose  WAS INCREASED TO 5 MG DAILY BUT SHE DID NOT TOLERATE  IT DUE TO CALF CRAMPS . However she is able to continue every other day dosing,       The risk factors are reflected in the social history.  The roster of all physicians providing medical care to patient - is listed in the Snapshot section of the chart.  Activities of daily living:  The patient is 100% independent in all ADLs: dressing, toileting, feeding as well as independent mobility  Home safety : The patient has smoke detectors in the home. They wear seatbelts.  There are no firearms at home. There is no violence in the  home.   There is no risks for hepatitis, STDs or HIV. There is no   history of blood transfusion. They have no travel history to infectious disease endemic areas of the world.  The patient has seen their dentist in the last six month. They have seen their eye doctor in the last year. They admit to slight hearing difficulty with regard to whispered voices and some television programs.  They have deferred audiologic testing in the last year.  They do not  have excessive sun exposure. Discussed the need for sun protection: hats, long sleeves and use of sunscreen if there is significant sun exposure.   Diet: the importance of a healthy diet is discussed. They do have a healthy diet.  The benefits of regular aerobic exercise were discussed. She walks 4 times per week ,  20 minutes.   Depression screen: there are no signs or vegative symptoms of depression- irritability, change in appetite, anhedonia, sadness/tearfullness.  Cognitive assessment: the patient manages all their financial and personal affairs and is actively engaged. They could relate day,date,year and events; recalled 2/3 objects at 3 minutes; performed clock-face test normally.  The following portions of the patient's history were reviewed and updated as appropriate: allergies, current medications, past family history, past medical history,  past surgical history, past social history  and problem  list.  Visual acuity was not assessed per patient preference since she has regular follow up with her ophthalmologist. Hearing and body mass index were assessed and reviewed.   During the course of the visit the patient was educated and counseled about appropriate screening and preventive services including : fall prevention , diabetes screening, nutrition counseling, colorectal cancer screening, and recommended immunizations.   Review of Systems:  Patient denies headache, fevers, malaise, unintentional weight loss, skin rash, eye pain, sinus  congestion and sinus pain, sore throat, dysphagia,  hemoptysis , cough, dyspnea, wheezing, chest pain, palpitations, orthopnea, edema, abdominal pain, nausea, melena, diarrhea, constipation, flank pain, dysuria, hematuria, urinary  Frequency, nocturia, numbness, tingling, seizures,  Focal weakness, Loss of consciousness,  Tremor, insomnia, depression, anxiety, and suicidal ideation.   Objective:  BP 106/68 mmHg  Pulse 74  Temp(Src) 97.7 F (36.5 C) (Oral)  Resp 16  Ht 5' 5.5" (1.664 m)  Wt 143 lb 4 oz (64.978 kg)  BMI 23.47 kg/m2  SpO2 98%  LMP  (Exact Date)  General appearance: alert, cooperative and appears stated age Head: Normocephalic, without obvious abnormality, atraumatic Eyes: conjunctivae/corneas clear. PERRL, EOM's intact. Fundi benign. Ears: normal TM's and external ear canals both ears Nose: Nares normal. Septum midline. Mucosa normal. No drainage or sinus tenderness. Throat: lips, mucosa, and tongue normal; teeth and gums normal Neck: no adenopathy, no carotid bruit, no JVD, supple, symmetrical, trachea midline and thyroid not enlarged, symmetric, no tenderness/mass/nodules Lungs: clear to auscultation bilaterally Breasts: large cyst on left lateral breast, normal appearance, no  tenderness Heart: irregular  Rhythm, normal rate, S1, S2 normal, no murmur, click, rub or gallop Abdomen: soft, non-tender; bowel sounds normal; no masses,  no organomegaly Extremities: extremities normal, atraumatic, no cyanosis or edema Pulses: 2+ and symmetric Skin: Skin color, texture, turgor normal. No rashes or lesions Neurologic: Alert and oriented X 3, normal strength and tone. Normal symmetric reflexes. Normal coordination and gait. Back: scoliosis noted .  no vertebral tenderness but tender over SI joint area on  The right.   Assessment and Plan:      Problem List Items Addressed This Visit    Visit for preventive health examination    Annual Medicare wellness  exam was done as  well as a comprehensive physical exam and management of acute and chronic conditions .  During the course of the visit the patient was educated and counseled about appropriate screening and preventive services including : fall prevention , diabetes screening, nutrition counseling, colorectal cancer screening, and recommended immunizations.  Printed recommendations for health maintenance screenings was given.       Thoracic aorta atherosclerosis    Noted on recent screening x ray.  Continue statin and daily asa.       Lumbago - Primary    Plain films normal were done today,  ruling out SI or lumbar vertebral fracture.  Treat for muscle strain/spasm      Relevant Orders   DG Lumbar Spine Complete (Completed)   MM Digital Diagnostic Bilat   Left breast mass    On exam today .  diagnostic mammogram ordered.       Hyperlipidemia    High intensity statin therapy on board but every other day due to patient intolerance of daily dosing.  Lab Results  Component Value Date   CHOL 176 02/02/2015   HDL 65.30 02/02/2015   LDLCALC 100* 02/02/2015   LDLDIRECT 132.5 03/25/2013   TRIG 54.0 02/02/2015   CHOLHDL 3 02/02/2015  Lab Results  Component Value Date   ALT 17 02/02/2015   AST 18 02/02/2015   ALKPHOS 56 02/02/2015   BILITOT 0.5 02/02/2015         Relevant Orders   Lipid panel (Completed)   Arrhythmia    EKG noted frequent PACs.  Lab Results  Component Value Date   TSH 2.12 08/01/2014   Lab Results  Component Value Date   NA 136 02/02/2015   K 4.4 02/02/2015   CL 104 02/02/2015   CO2 30 02/02/2015    electrolytes and thyroid function are normal       Relevant Orders   EKG 12-Lead (Completed)    Other Visit Diagnoses    Right-sided low back pain without sciatica        Relevant Orders    DG Lumbar Spine Complete (Completed)    MM Digital Diagnostic Bilat    Need for hepatitis C screening test        Relevant Orders    Hepatitis C antibody (Completed)     Vitamin D deficiency        Relevant Orders    Vit D  25 hydroxy (rtn osteoporosis monitoring) (Completed)    Long-term use of high-risk medication        Relevant Orders    Comprehensive metabolic panel (Completed)

## 2015-02-03 LAB — HEPATITIS C ANTIBODY: HCV Ab: NEGATIVE

## 2015-02-05 ENCOUNTER — Encounter: Payer: Self-pay | Admitting: Internal Medicine

## 2015-02-05 DIAGNOSIS — N6320 Unspecified lump in the left breast, unspecified quadrant: Secondary | ICD-10-CM | POA: Insufficient documentation

## 2015-02-05 DIAGNOSIS — I700 Atherosclerosis of aorta: Secondary | ICD-10-CM | POA: Insufficient documentation

## 2015-02-05 DIAGNOSIS — I499 Cardiac arrhythmia, unspecified: Secondary | ICD-10-CM | POA: Insufficient documentation

## 2015-02-05 NOTE — Assessment & Plan Note (Signed)
Noted on recent screening x ray.  Continue statin and daily asa.

## 2015-02-05 NOTE — Assessment & Plan Note (Signed)
Plain films normal were done today,  ruling out SI or lumbar vertebral fracture.  Treat for muscle strain/spasm

## 2015-02-05 NOTE — Assessment & Plan Note (Signed)

## 2015-02-05 NOTE — Assessment & Plan Note (Signed)
EKG noted frequent PACs.  Lab Results  Component Value Date   TSH 2.12 08/01/2014   Lab Results  Component Value Date   NA 136 02/02/2015   K 4.4 02/02/2015   CL 104 02/02/2015   CO2 30 02/02/2015    electrolytes and thyroid function are normal

## 2015-02-05 NOTE — Assessment & Plan Note (Signed)
High intensity statin therapy on board but every other day due to patient intolerance of daily dosing.  Lab Results  Component Value Date   CHOL 176 02/02/2015   HDL 65.30 02/02/2015   LDLCALC 100* 02/02/2015   LDLDIRECT 132.5 03/25/2013   TRIG 54.0 02/02/2015   CHOLHDL 3 02/02/2015   Lab Results  Component Value Date   ALT 17 02/02/2015   AST 18 02/02/2015   ALKPHOS 56 02/02/2015   BILITOT 0.5 02/02/2015

## 2015-02-05 NOTE — Assessment & Plan Note (Signed)
On exam today .  diagnostic mammogram ordered.

## 2015-02-06 ENCOUNTER — Telehealth: Payer: Self-pay

## 2015-02-06 NOTE — Telephone Encounter (Signed)
I have sent pt's info for Prolia insurance verification and will notify you once I have a response. Thank you. °

## 2015-02-06 NOTE — Telephone Encounter (Signed)
The patient called and is hoping to schedule her mammogram.

## 2015-02-16 ENCOUNTER — Other Ambulatory Visit: Payer: Self-pay | Admitting: *Deleted

## 2015-02-16 ENCOUNTER — Ambulatory Visit: Payer: Self-pay | Admitting: Internal Medicine

## 2015-02-16 DIAGNOSIS — N6320 Unspecified lump in the left breast, unspecified quadrant: Secondary | ICD-10-CM

## 2015-02-16 LAB — HM MAMMOGRAPHY: HM Mammogram: NORMAL

## 2015-02-16 NOTE — Telephone Encounter (Signed)
I have rec'd pt's insurance verification for Prolia.  Her primary insurance, [employer] will cover 80% of her injection once she has met her $166 deductible, which she has met.  This would leave her with a 20% co-insurance (approximately $180).  Her secondary insurance, [employer] covers the [facility] deductible, co-insurance, and 100% of the excess charges.  This means pt will have an estimated responsibility of $0.  Please make her aware this is an estimate and we will not know an exact amt until both of her insurances have paid.  I have sent a copy of the summary of benefits to be scanned into her chart.  Please let me know if you have any questions. Thank you.

## 2015-02-16 NOTE — Telephone Encounter (Signed)
Sent mychart to pt.

## 2015-02-17 ENCOUNTER — Encounter: Payer: Self-pay | Admitting: Internal Medicine

## 2015-02-23 ENCOUNTER — Telehealth: Payer: Self-pay | Admitting: Internal Medicine

## 2015-02-23 NOTE — Telephone Encounter (Signed)
Prolia ordered

## 2015-02-23 NOTE — Telephone Encounter (Signed)
Pt scheduled for prolia shot on 4/14. Please order prolia shot/msn

## 2015-03-08 ENCOUNTER — Ambulatory Visit: Payer: Medicare Other

## 2015-03-16 ENCOUNTER — Ambulatory Visit (INDEPENDENT_AMBULATORY_CARE_PROVIDER_SITE_OTHER): Payer: Medicare Other | Admitting: *Deleted

## 2015-03-16 DIAGNOSIS — M810 Age-related osteoporosis without current pathological fracture: Secondary | ICD-10-CM | POA: Diagnosis not present

## 2015-03-16 MED ORDER — DENOSUMAB 60 MG/ML ~~LOC~~ SOLN
60.0000 mg | Freq: Once | SUBCUTANEOUS | Status: AC
  Administered 2015-03-16: 60 mg via SUBCUTANEOUS

## 2015-03-16 MED ORDER — DENOSUMAB 60 MG/ML ~~LOC~~ SOLN: 60.0000 mg | Freq: Once | SUBCUTANEOUS | Status: DC

## 2015-03-16 NOTE — Addendum Note (Signed)
Addended by: Johnsie Cancel on: 03/16/2015 10:22 AM   Modules accepted: Orders

## 2015-03-21 NOTE — Op Note (Signed)
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   OPERATIVE PROCEDURE: Intraocular lens exchange.   SURGEON: Livingston Diones. Vonetta Foulk, MD   ANESTHESIA:   1. Managed anesthesia care.  2. Topical lidocaine jelly and tetracaine.   COMPLICATIONS: None.   DESCRIPTION OF PROCEDURE: The patient was examined and consented for this procedure in the preoperative holding area. She was then brought back to the Operating Room where she was sat up on the bed, and the right cornea was marked in the horizontal plane for placement of a toric lens. The patient was then reclined on the surgical lens gurney, and the right eye was prepped and draped in the usual sterile ophthalmic fashion. A corneal marker was used to indicate the proper alignment of the lens to be placed in the eye at 140 degrees. The original paracentesis was reopened with a paracentesis blade and DisCoVisc was used to inflate the anterior chamber as well as to separate the anterior surface of the IOL from the anterior capsule. The original main incision was then reopened using the keratome. The DisCoVisc was then used further to fill the posterior capsule, separating it from the intraocular lens. The intraocular lens was gently buoyed into the anterior chamber. With the original lens in place and the posterior chamber filled with viscoelastic, a new lens was placed. The [facility] ZCT150, 13.0-diopter lens, serial [id number], was placed into the capsular bag without complication. A second paracentesis was then created 180 degrees from the original paracentesis. The intraocular micro scissors were introduced through the second paracentesis, and the lens was supported with the Sinskey hook, and the scissors  were used to bisect the original lens. The keratome was used to enlarge the original incision slightly, and each half of the bisected lens was removed without difficulty. The irrigation-aspiration handpiece was used to remove the viscoelastic from the eye as well as to rotate the lens into its desired position at 140 degrees. The eye was inflated with BSS. The wounds were hydrated, and a single 10-0 nylon suture was placed through the main incision. All the incisions were found to be watertight. The eye was dressed with Vigamox, and the patient was sent home with instructions to follow up with me the same afternoon at the [facility].  ____________________________ Livingston Diones. Fontaine Hehl, MD wlp:cbb D: 06/29/2012 20:16:38 ET T: 06/30/2012 09:30:37 ET JOB#: [id number]  cc: Zyann Mabry L. Zorawar Strollo, MD, <Dictator> Livingston Diones Dawan Farney MD ELECTRONICALLY SIGNED 07/07/2012 12:51

## 2015-03-26 NOTE — Op Note (Signed)
PATIENT NAME:  [name], [name] MR#:  [id number] DATE OF BIRTH:  [birth year]  DATE OF PROCEDURE:  06/09/2012  PREOPERATIVE DIAGNOSIS: Visually significant cataract of the right eye.   POSTOPERATIVE DIAGNOSIS: Visually significant cataract of the right eye.   OPERATIVE PROCEDURE: Cataract extraction by phacoemulsification with implant of intraocular lens to right eye.   SURGEON: Birder Robson, MD.   ANESTHESIA:  1. Managed anesthesia care.  2. Topical tetracaine drops followed by 2% Xylocaine jelly applied in the preoperative holding area.   COMPLICATIONS: None.   TECHNIQUE:  Stop and chop.    DESCRIPTION OF PROCEDURE: The patient was examined and consented in the preoperative holding area where the aforementioned topical anesthesia was applied to the right eye.  The patient was brought back to the Operating Room where he was sat upright on the gurney and given a target to fixate upon while the eye was marked at the 3:00 and 9:00 position.  The patient was then reclined on the operating table, and lidocaine jelly was applied to the right eye.  The eye was prepped and draped in the usual sterile ophthalmic fashion and a lid speculum was placed. A paracentesis was created with the side port blade and the anterior chamber was filled with viscoelastic. A near clear corneal incision was performed with the steel keratome. A continuous curvilinear capsulorrhexis was performed with a cystotome followed by the capsulorrhexis forceps. Hydrodissection and hydrodelineation were carried out with BSS on a blunt cannula. The lens was removed in a stop and chop technique and the remaining cortical material was removed with the irrigation-aspiration handpiece. The eye was inflated with viscoelastic and the [employer] ZCT150 10.0-diopter lens, serial number [id number]  was placed in the eye and rotated to within a few degrees of the predetermined orientation at 135 degrees.  The remaining viscoelastic was removed from  the eye.  The Sinskey hook was used to rotate the toric lens into its final resting place at 135 degrees.  The eye was inflated to a physiologic pressure and found to be watertight.  The eye was dressed with Vigamox. The patient was given protective glasses to wear throughout the day and a shield with which to sleep tonight. The patient was also given drops with which to begin a drop regimen today and will follow-up with me in one day.   ____________________________ [name] [name]. Arzu Mcgaughey, MD wlp:cms D: 06/09/2012 12:33:55 ET T: 06/09/2012 12:59:56 ET JOB#: [id number]  cc: [name] [name] L. [name] Wynder, MD, <Dictator> [name] [name] [name] Santee MD ELECTRONICALLY SIGNED 06/10/2012 9:58

## 2015-05-02 ENCOUNTER — Other Ambulatory Visit: Payer: Self-pay | Admitting: Internal Medicine

## 2015-05-02 NOTE — Telephone Encounter (Signed)
Last OV 3.3.16, no f/u scheduled.  Ok to refill?

## 2015-05-03 NOTE — Telephone Encounter (Signed)
Ok to refill,  Refill sent  

## 2015-05-03 NOTE — Telephone Encounter (Signed)
Refill faxed

## 2015-07-31 DIAGNOSIS — R918 Other nonspecific abnormal finding of lung field: Secondary | ICD-10-CM | POA: Insufficient documentation

## 2015-08-01 ENCOUNTER — Telehealth: Payer: Self-pay | Admitting: Internal Medicine

## 2015-08-01 NOTE — Telephone Encounter (Signed)
Spoke with pt, advised her to request her medical records prior to discharge from hospital.  [place] fax number to [facility].

## 2015-08-01 NOTE — Telephone Encounter (Signed)
Pt is hospitalized in [facility], Cardiac floor) for Afib, CHF and they thought Pneumonia pt had a CT scan and found a mass on the lung. Pt was told not to start any treatments there,but to come home to be seen by PCP. No appt avail for me to schedule. Pt has not been discharged as of yet. Pt has had an echo done. Thank You!

## 2015-08-01 NOTE — Telephone Encounter (Signed)
FYI [place]

## 2015-08-02 NOTE — Telephone Encounter (Signed)
FYI

## 2015-08-02 NOTE — Telephone Encounter (Signed)
Records for HFU in red folder.Can give to me to hold for appointment after you view.

## 2015-08-03 ENCOUNTER — Telehealth: Payer: Self-pay

## 2015-08-03 ENCOUNTER — Telehealth: Payer: Self-pay | Admitting: Internal Medicine

## 2015-08-03 NOTE — Telephone Encounter (Signed)
Spoke with [place] in medical records.  She is faxing 34 pages of records.

## 2015-08-03 NOTE — Telephone Encounter (Signed)
Transition Care Management Follow-up Telephone Call   Date discharged?08/02/15   How have you been since you were released from the hospital? Weak, but okay.  Fatigued.   Do you understand why you were in the hospital? There was some confusion at first, but yes I do.   Do you understand the discharge instructions? Yes.   Where were you discharged to? Home.   Items Reviewed:  Medications reviewed: Yes  Allergies reviewed: Yes  Dietary changes reviewed: Yes  Referrals reviewed: Yes   Functional Questionnaire:   Activities of Daily Living (ADLs):   She states they are independent in the following: All ADLs States they require assistance with the following: No assistance is required at this time, but I do have family and friends to help bring meals and am now using a shower chair.   Any transportation issues/concerns?: No, I can drive 10 minutes and its all flat.   Any patient concerns? Requests a Rx for handicap pass.   Confirmed importance and date/time of follow-up visits scheduled Yes, HFU appointment scheduled 08/04/15. F/U appointment scheduled with Cardiologist.  Provider Appointment booked with Dr. Derrel Nip (PCP).  Confirmed with patient if condition begins to worsen call PCP or go to the ER.  Patient was given the office number and encouraged to call back with question or concerns.  : Yes, patient verbalized understanding.

## 2015-08-03 NOTE — Telephone Encounter (Signed)
Pt called wanting to know if her records came in and was received from the hospital in [place]? Pt states if we have not received it here's a number to call [phone]. Ask for medical records Admission dates 8/29-08/31. Pt does have a CD. Thank You!

## 2015-08-03 NOTE — Telephone Encounter (Signed)
The number to contact the hospital is incorrect. The correct number is [phone] fax number [phone] a release would be sent to the fax number. Thank You!

## 2015-08-04 ENCOUNTER — Ambulatory Visit (INDEPENDENT_AMBULATORY_CARE_PROVIDER_SITE_OTHER): Payer: Medicare Other | Admitting: Internal Medicine

## 2015-08-04 VITALS — BP 106/72 | HR 73 | Temp 98.2°F | Ht 65.5 in | Wt 146.4 lb

## 2015-08-04 DIAGNOSIS — Z23 Encounter for immunization: Secondary | ICD-10-CM | POA: Diagnosis not present

## 2015-08-04 DIAGNOSIS — R911 Solitary pulmonary nodule: Secondary | ICD-10-CM | POA: Insufficient documentation

## 2015-08-04 DIAGNOSIS — I4891 Unspecified atrial fibrillation: Secondary | ICD-10-CM | POA: Insufficient documentation

## 2015-08-04 NOTE — Patient Instructions (Addendum)
Continue the Lovenox twice daily until you see Dr Acie Fredrickson  Weigh yourself daily and continue the lasix daily until you are back to your previous weight.  The stop the daily dose and take only if you weight  increases by 2 lbs overnight    We are arranging an urgent pulmonology referral with Dr Milinda Hirschfeld of  [facility] to assess the Abnormal lung CT

## 2015-08-04 NOTE — Progress Notes (Signed)
Subjective:  Patient ID: [name] [name], female    DOB: 07-02-1941  Age: 74 y.o. MRN: [id number]  CC: The primary encounter diagnosis was Nodule of right lung. Diagnoses of Atrial fibrillation with RVR and Encounter for immunization were also pertinent to this visit.  HPI [name] [name] presents for hospital follow up. Patient was admitted to [facility] on August 29th with atrial fib with RVR,  .   Symptoms started while walking through a mall 10 days prior to presentation.  She noticed that she was becoming  Dyspneic with minimal exertion.  Symptoms improved transiently so she went ahead with plans to visit a friend in [place] but during the trip the dyspnea progressed and by the time she reached her destination she had bilateral LE edema , and was in respiratory distress.  Her friend's husband noted an irregular pulse and she was taken to ER .    She was worked up for PE a with contrasted chest angiography.  abnormal chest CT concerning for lung mass vs PNA.  She was treated with  IV diliazem, seen bu tby cardiology and discharged on Dilt 240 mg CD , lovenox and  and lasix on August 31. Had 11 days worth of Loveonox .    Told her atrial fib was due to severe Mitral valve stenosis   Seen on ECHO.  Has appt with Dr Acie Fredrickson on Tuesday Sept 6 th  Was told she had a 3 cm mass in lungs, advised to have  Bronchoscopy.   She is no longer dyspneic but is very anxious about the possibe lung mass.  She is a lifelong nonsmoker .  Outpatient Prescriptions Prior to Visit  Medication Sig Dispense Refill  . ALPRAZolam (XANAX) 0.5 MG tablet Take 1 tablet (0.5 mg total) by mouth at bedtime as needed for anxiety or sleep. 30 tablet 3  . aspirin-acetaminophen-caffeine (EXCEDRIN MIGRAINE) 951-884-16 MG per tablet Take 1 tablet by mouth every 6 (six) hours as needed.      Marland Kitchen b complex vitamins tablet Take 1 tablet by mouth daily.      . butalbital-acetaminophen-caffeine (FIORICET, ESGIC) 50-325-40  MG per tablet TAKE 1 TABLET BY MOUTH AS NEEDED FOR BACK PAIN OR HEADACHE 90 tablet 0  . calcium citrate (CALCITRATE - DOSED IN MG ELEMENTAL CALCIUM) 950 MG tablet Take 200 mg of elemental calcium by mouth 3 (three) times daily with meals.    . celecoxib (CELEBREX) 100 MG capsule Take 1 capsule (100 mg total) by mouth 2 (two) times daily. 20 capsule 0  . Cholecalciferol (VITAMIN D) 1000 UNITS capsule Take 1,000 Units by mouth daily.      . CRESTOR 5 MG tablet TAKE 1 TABLET BY MOUTH EVERY DAY (Patient taking differently: TAKE 1 TABLET BY MOUTH EVERY other day) 30 tablet 3  . cyclobenzaprine (FLEXERIL) 5 MG tablet Take 1 tablet (5 mg total) by mouth 3 (three) times daily as needed for muscle spasms. 30 tablet 0  . Ibuprofen (MOTRIN PO) Take by mouth as needed.      . montelukast (SINGULAIR) 10 MG tablet Take 10 mg by mouth at bedtime.      . Multiple Vitamin (MULTIVITAMIN) tablet Take 1 tablet by mouth daily.      . NON FORMULARY citrocel as needed.    . Omega-3 Fatty Acids (FISH OIL PO) Take by mouth daily.      . Probiotic Product (PROBIOTIC PO) Take by mouth daily.      Marland Kitchen  vitamin C (ASCORBIC ACID) 500 MG tablet Take 500 mg by mouth daily.      . vitamin E 100 UNIT capsule Take 400 Units by mouth daily.       No facility-administered medications prior to visit.    Review of Systems;  Patient denies headache, fevers, malaise, unintentional weight loss, skin rash, eye pain, sinus congestion and sinus pain, sore throat, dysphagia,  hemoptysis , cough, dyspnea, wheezing, chest pain, palpitations, orthopnea, edema, abdominal pain, nausea, melena, diarrhea, constipation, flank pain, dysuria, hematuria, urinary  Frequency, nocturia, numbness, tingling, seizures,  Focal weakness, Loss of consciousness,  Tremor, insomnia, depression, anxiety, and suicidal ideation.      Objective:  BP 106/72 mmHg  Pulse 73  Temp(Src) 98.2 F (36.8 C) (Oral)  Ht 5' 5.5" (1.664 m)  Wt 146 lb 6.4 oz (66.407 kg)   BMI 23.98 kg/m2  SpO2 98%  LMP  (Exact Date)  BP Readings from Last 3 Encounters:  08/04/15 106/72  02/02/15 106/68  09/02/14 109/69    Wt Readings from Last 3 Encounters:  08/04/15 146 lb 6.4 oz (66.407 kg)  02/02/15 143 lb 4 oz (64.978 kg)  09/02/14 142 lb 8 oz (64.638 kg)    General appearance: alert, cooperative and appears stated age Ears: normal TM's and external ear canals both ears Throat: lips, mucosa, and tongue normal; teeth and gums normal Neck: no adenopathy, no carotid bruit, supple, symmetrical, trachea midline and thyroid not enlarged, symmetric, no tenderness/mass/nodules Back: symmetric, no curvature. ROM normal. No CVA tenderness. Lungs: clear to auscultation bilaterally Heart: regular rate and rhythm, S1, S2 normal, no murmur, click, rub or gallop Abdomen: soft, non-tender; bowel sounds normal; no masses,  no organomegaly Pulses: 2+ and symmetric Skin: Skin color, texture, turgor normal. No rashes or lesions Lymph nodes: Cervical, supraclavicular, and axillary nodes normal.  No results found for: HGBA1C  Lab Results  Component Value Date   CREATININE 0.85 02/02/2015   CREATININE 0.87 12/05/2014   CREATININE 0.8 08/01/2014    Lab Results  Component Value Date   WBC 5.1 08/01/2014   HGB 13.0 08/01/2014   HCT 38.8 08/01/2014   PLT 135.0* 08/01/2014   GLUCOSE 93 02/02/2015   CHOL 176 02/02/2015   TRIG 54.0 02/02/2015   HDL 65.30 02/02/2015   LDLDIRECT 132.5 03/25/2013   LDLCALC 100* 02/02/2015   ALT 17 02/02/2015   AST 18 02/02/2015   NA 136 02/02/2015   K 4.4 02/02/2015   CL 104 02/02/2015   CREATININE 0.85 02/02/2015   BUN 26* 02/02/2015   CO2 30 02/02/2015   TSH 2.12 08/01/2014    No results found.  Assessment & Plan:   Problem List Items Addressed This Visit      Unprioritized   Nodule of right lung - Primary    3 cm lung mass was reported on outside hospital  Chest CT .  An urgent pulmonology referral has been made for Tuesday  Sept 6 . Advised patient that CT will need repeating .  Lab Results  Component Value Date   CREATININE 0.85 02/02/2015         Relevant Orders   Ambulatory referral to Pulmonology   Atrial fibrillation with RVR    currenlty rate controlled and has no signs of hear failure on exam.  Discussed the rationale for long term anticoagulation with warfarin given the valvular etiology of her AF, but her CHADS 2 Score is low based on recent ECHO .  She has cardiology follow  up on Sept 6th and will continue  Lovenox until then.       Relevant Medications   enoxaparin (LOVENOX) 100 MG/ML injection   furosemide (LASIX) 40 MG tablet   CARTIA XT 240 MG 24 hr capsule    Other Visit Diagnoses    Encounter for immunization           I have discontinued Ms. Warrick diltiazem. I am also having her maintain her multivitamin, b complex vitamins, montelukast, vitamin C, Vitamin D, vitamin E, aspirin-acetaminophen-caffeine, Ibuprofen (MOTRIN PO), Probiotic Product (PROBIOTIC PO), Omega-3 Fatty Acids (FISH OIL PO), NON FORMULARY, calcium citrate, ALPRAZolam, CRESTOR, celecoxib, cyclobenzaprine, butalbital-acetaminophen-caffeine, enoxaparin, furosemide, and CARTIA XT.  Meds ordered this encounter  Medications  . DISCONTD: diltiazem (TIAZAC) 240 MG 24 hr capsule    Sig: Take 240 mg by mouth daily.  Marland Kitchen enoxaparin (LOVENOX) 100 MG/ML injection    Sig: Inject 70 mg into the skin every 12 (twelve) hours.  . furosemide (LASIX) 40 MG tablet    Sig: Take 40 mg by mouth.  Geronimo Boot XT 240 MG 24 hr capsule    Sig: TK 1 C PO QD    Refill:  0    Medications Discontinued During This Encounter  Medication Reason  . diltiazem (TIAZAC) 240 MG 24 hr capsule Entry Error    Follow-up: Return in about 2 weeks (around 08/18/2015).   [name], MD

## 2015-08-06 ENCOUNTER — Encounter: Payer: Self-pay | Admitting: Internal Medicine

## 2015-08-06 NOTE — Assessment & Plan Note (Signed)
3 cm lung mass was reported on outside hospital  Chest CT .  An urgent pulmonology referral has been made for Tuesday Sept 6 . Advised patient that CT will need repeating .  Lab Results  Component Value Date   CREATININE 0.85 02/02/2015

## 2015-08-06 NOTE — Assessment & Plan Note (Signed)
currenlty rate controlled and has no signs of hear failure on exam.  Discussed the rationale for long term anticoagulation with warfarin given the valvular etiology of her AF, but her CHADS 2 Score is low based on recent ECHO .  She has cardiology follow up on Sept 6th and will continue  Lovenox until then.

## 2015-08-08 ENCOUNTER — Encounter: Payer: Self-pay | Admitting: Internal Medicine

## 2015-08-08 ENCOUNTER — Encounter: Payer: Self-pay | Admitting: Pulmonary Disease

## 2015-08-08 ENCOUNTER — Ambulatory Visit (INDEPENDENT_AMBULATORY_CARE_PROVIDER_SITE_OTHER): Payer: Medicare Other | Admitting: Cardiovascular Disease

## 2015-08-08 ENCOUNTER — Institutional Professional Consult (permissible substitution): Payer: Medicare Other | Admitting: Internal Medicine

## 2015-08-08 ENCOUNTER — Ambulatory Visit (INDEPENDENT_AMBULATORY_CARE_PROVIDER_SITE_OTHER): Payer: Medicare Other | Admitting: Pulmonary Disease

## 2015-08-08 ENCOUNTER — Encounter: Payer: Self-pay | Admitting: Cardiovascular Disease

## 2015-08-08 VITALS — BP 110/62 | HR 124 | Ht 66.0 in | Wt 142.0 lb

## 2015-08-08 VITALS — BP 90/66 | HR 106 | Ht 66.0 in | Wt 142.2 lb

## 2015-08-08 DIAGNOSIS — I272 Pulmonary hypertension, unspecified: Secondary | ICD-10-CM | POA: Insufficient documentation

## 2015-08-08 DIAGNOSIS — I052 Rheumatic mitral stenosis with insufficiency: Secondary | ICD-10-CM | POA: Insufficient documentation

## 2015-08-08 DIAGNOSIS — R918 Other nonspecific abnormal finding of lung field: Secondary | ICD-10-CM | POA: Diagnosis not present

## 2015-08-08 DIAGNOSIS — S02609A Fracture of mandible, unspecified, initial encounter for closed fracture: Secondary | ICD-10-CM | POA: Insufficient documentation

## 2015-08-08 DIAGNOSIS — I4891 Unspecified atrial fibrillation: Secondary | ICD-10-CM

## 2015-08-08 MED ORDER — ROSUVASTATIN CALCIUM 5 MG PO TABS: 5.0000 mg | ORAL_TABLET | ORAL | Status: DC

## 2015-08-08 MED ORDER — METOPROLOL TARTRATE 50 MG PO TABS: 50.0000 mg | ORAL_TABLET | Freq: Two times a day (BID) | ORAL | Status: DC

## 2015-08-08 MED ORDER — APIXABAN 5 MG PO TABS: 5.0000 mg | ORAL_TABLET | Freq: Two times a day (BID) | ORAL | Status: DC

## 2015-08-08 NOTE — Patient Instructions (Signed)
1.  We are repeating a CT scan in the next couple of weeks. If there is any residual question of a mass we may need to do further scans. 2.  I will see you back in 2-4 weeks to ensure this is dealt with.

## 2015-08-08 NOTE — Patient Instructions (Addendum)
Medication Instructions:  STOP Lovenox and START Eliquis 5 mg tomorrow - take twice daily (12 hours apart) STOP Cartia START Metoprolol 50 mg twice daily (12 hours apart)   Labwork: None Ordered   Testing/Procedures: None Ordered   Follow-Up: Your physician recommends that you schedule a follow-up appointment in: 1 week with Dr. Acie Fredrickson - Tuesday 9/13 @ 11:30 am

## 2015-08-08 NOTE — Addendum Note (Signed)
Addended by: Emmaline Life on: 08/08/2015 04:54 PM   Modules accepted: Orders

## 2015-08-08 NOTE — Progress Notes (Signed)
Subjective:    Patient ID: [name] [name], female    DOB: 16-Apr-1941, 74 y.o.   MRN: [id number]  HPI Known history of atrial fibrillation with RVR. Patient hospitalized on August 29 with atrial fibrillation with RVR. During this hospitalization she underwent workup which entailed a CT scan of the chest which was reportedly worrisome for lung mass versus pneumonia. At the time the patient did have dyspnea which has significantly improved per follow-up record. Patient was discharged on Lovenox & diltiazem. Reports her last chest X-ray is October 2015. She reports she does have dyspnea & coughing with talking. No hemoptysis. She reports her cough is nonproductive. Previously she had chest "tightness" and felt "bloated". These have resolved. No dysphagia or odynophagia. No abdominal pain. No adenopathy in her neck, groin, or axilla. She denies any headaches. No focal weakness, numbness, or tingling. She reports her very first symptoms was "complete body weakness".  Review of Systems No rashes but does bruise easily. No hematuria. No melena or hematochezia. Does have frequent bowel movements. A pertinent 14 point review of systems is negative except as per the history of presenting illness.  Allergies  Allergen Reactions  . Codeine Other (See Comments)    migraine  . Doxycycline Itching  . Hydrocodone Nausea And Vomiting  . Hydromorphone Nausea And Vomiting  . Meloxicam   . Oxycodone    Current Outpatient Prescriptions on File Prior to Visit  Medication Sig Dispense Refill  . ALPRAZolam (XANAX) 0.5 MG tablet Take 1 tablet (0.5 mg total) by mouth at bedtime as needed for anxiety or sleep. 30 tablet 3  . aspirin-acetaminophen-caffeine (EXCEDRIN MIGRAINE) 240-973-53 MG per tablet Take 1 tablet by mouth every 6 (six) hours as needed.      Marland Kitchen b complex vitamins tablet Take 1 tablet by mouth daily.      . butalbital-acetaminophen-caffeine (FIORICET, ESGIC) 50-325-40 MG per tablet TAKE 1 TABLET BY  MOUTH AS NEEDED FOR BACK PAIN OR HEADACHE 90 tablet 0  . calcium citrate (CALCITRATE - DOSED IN MG ELEMENTAL CALCIUM) 950 MG tablet Take 200 mg of elemental calcium by mouth 3 (three) times daily with meals.    Marland Kitchen CARTIA XT 240 MG 24 hr capsule TK 1 C PO QD  0  . celecoxib (CELEBREX) 100 MG capsule Take 1 capsule (100 mg total) by mouth 2 (two) times daily. 20 capsule 0  . Cholecalciferol (VITAMIN D) 1000 UNITS capsule Take 1,000 Units by mouth daily.      . CRESTOR 5 MG tablet TAKE 1 TABLET BY MOUTH EVERY DAY (Patient taking differently: TAKE 1 TABLET BY MOUTH EVERY other day) 30 tablet 3  . cyclobenzaprine (FLEXERIL) 5 MG tablet Take 1 tablet (5 mg total) by mouth 3 (three) times daily as needed for muscle spasms. 30 tablet 0  . enoxaparin (LOVENOX) 100 MG/ML injection Inject 70 mg into the skin every 12 (twelve) hours.    . furosemide (LASIX) 40 MG tablet Take 40 mg by mouth.    . Ibuprofen (MOTRIN PO) Take by mouth as needed.      . montelukast (SINGULAIR) 10 MG tablet Take 10 mg by mouth at bedtime.      . Multiple Vitamin (MULTIVITAMIN) tablet Take 1 tablet by mouth daily.      . NON FORMULARY citrocel as needed.    . Omega-3 Fatty Acids (FISH OIL PO) Take by mouth daily.      . Probiotic Product (PROBIOTIC PO) Take by mouth daily.      Marland Kitchen  vitamin C (ASCORBIC ACID) 500 MG tablet Take 500 mg by mouth daily.      . vitamin E 100 UNIT capsule Take 400 Units by mouth daily.       No current facility-administered medications on file prior to visit.   Past Medical History  Diagnosis Date  . SVT (supraventricular tachycardia)   . MVP (mitral valve prolapse)   . Hyperlipidemia   . Idiopathic thrombocytopenic purpura (ITP)   . Hemorrhoid   . IBS (irritable bowel syndrome)   . Migraine    Past Surgical History  Procedure Laterality Date  . Tubal ligation    . Foot surgery    . Varicose vein surgery    . Mandible fracture surgery  03-26-13  . Colonoscopy  2003  . Eye surgery Right 2013    . Breast biopsy     Family History  Problem Relation Age of Onset  . Hypertension Mother   . Arrhythmia Mother   . Heart failure Mother   . Arrhythmia Brother   . Stroke Brother 39    cerebral hemorrhage, nonsmoker, no HTN  . Stroke Father     from an aneurysm  . Stroke Maternal Aunt 83    cerebral hemorrhage  . Liver cancer Maternal Grandmother   . Atrial fibrillation Son    Social History   Social History  . Marital Status: Widowed    Spouse Name: Deceased  . Number of Children: N/A  . Years of Education: 40   Social History Main Topics  . Smoking status: Never Smoker   . Smokeless tobacco: Never Used     Comment: Second-hand exposure at her work 2ppd for 3 years  . Alcohol Use: 1.8 oz/week    3 Standard drinks or equivalent per week     Comment: occasionally  . Drug Use: No  . Sexual Activity: No   Other Topics Concern  . None   Social History Narrative   She is a widow.  Husband died from metastatic renal cell cancer. Originally from [place]. Previously lived in [place] from 1975-[place]. Moved to [place] in 2007. No mold exposure recently but did have it through a prior work exposure in 1993. Has a masters in public health. No bird exposure.      Objective:   Physical Exam Blood pressure 110/62, pulse 124, height 5\' 6"  (1.676 m), weight 142 lb (64.411 kg), SpO2 95 %. General:  Awake. Alert. No acute distress. Thin, frail female.  Integument:  Warm & dry. No rash on exposed skin. No bruising. Lymphatics:  No appreciated cervical or supraclavicular lymphadenoapthy. HEENT:  Moist mucus membranes. No oral ulcers. No scleral injection or icterus. PERRL. Cardiovascular:  Irregular rhythm. No edema. Borderline hepatojugular reflux.  Pulmonary:  Mild crackles bilateral lung bases. Symmetric chest wall expansion. No accessory muscle use on room air. Abdomen: Soft. Normal bowel sounds. Nondistended. Grossly nontender. Musculoskeletal:  Normal bulk and tone. Hand grip  strength 5/5 bilaterally. No joint deformity or effusion appreciated. Neurological:  CN 2-12 grossly in tact. No meningismus. Moving all 4 extremities equally. Symmetric patellar deep tendon reflexes. Psychiatric:  Mood and affect congruent. Speech normal rhythm, rate & tone.   IMAGING CT CHEST W/ 07/31/15 (personally reviewed by me):  Bilateral pleural effusions right greater than left. No pathologic mediastinal adenopathy. Patchy opacity in the right lung.   CARDIAC TTE (07/31/15): Difficult study. LV normal in size with mild concentric LVH. EF 60%. Unable to assess diastolic function due to atrial fibrillation. Normal  regional function. LA & RA severely enlarged. RV normal in size and function. Pulmonary artery systolic pressure 35-57 mmHg. IVC dilated. Aortic valve not well seen. Trace aortic regurgitation. Moderate mitral valve stenosis with mild regurgitation. Mild tricuspid regurgitation. No pulmonic stenosis or regurgitation. No pericardial effusion.    Assessment & Plan:  74 year old female with underlying atrial fibrillation with RVR. I personally reviewed her chest CT scan which with bilateral pleural effusions is strongly indicative of decompensated congestive heart failure. Symptomatically she has improved but continues to have residual cough. She does have some tachycardia with an irregular rhythm in clinic but does not appear to be decompensated. She has not been routinely using her Lasix. She does have an appointment with cardiology following this appointment. We will follow up her chest imaging and proceed with further imaging versus procedures depending upon this result.  1. Right lung mass: Plan for repeat CT scan of the chest without contrast. Further bronchoscopy versus PET/CT scan depending upon this result. 2. Atrial fibrillation: Patient does have follow-up with cardiology. Currently on diltiazem & Lovenox. 3. Follow-up: Patient to return to clinic in 2-4 weeks.

## 2015-08-08 NOTE — Progress Notes (Signed)
[name] [name] Date of Birth  Oct 02, 1941       [facility] [address], [place], [place]  [address]   [place], [place]  [address] [phone]     [phone]   Fax  [phone]    Fax [phone]  Problem List: 1. Supraventricular tachycardia 2. Mitral valve prolapse 3. Cataracts  History of Present Illness:  [name] [name] is a  74 yo with hx of SVT and MVP.  She needs to have cataract surgery and is hear for pre op evaluation.  She has done well.  She has not been exercising as much as she would like.  She has not had any syncope or presyncope.  She has been taking care of her granddaughter who has ADD and a sleep disorder.  [name] [name] did not get much sleep that weekend and had lots of palpitations for the following week.  Oct. 7, 2014  [name] [name] is seen after a 1 1/2 year absence.  She has had some fatigue.   Dr. Derrel Nip has found thrombocytopenia.  Crestor was stopped but this did not help the thrombocytopenia.   She has seen a hemotologist.    She fell  (caught her toe at a [employer] - not syncope) several months ago and had reconstruction of her jaw.  She also broke her left wrist.     Oct. 2, 2015 :  [name] [name] is doing well from a cardiac standpoint.  Her cholesterol levels are slightly elevated ( even on Crestor 5 mg a day).  She has had marginally low platelets .  Her cholesterol levels are still minimally elevated.    She had some dyspnea this summer.  Currently , she has no limitations.  She thinks the symptoms may have been due to allergies.  No chest pain.    She has had several falls this year .  She thinks that her stamina is not as good as it should be.  She does yoga but no cardio workouts.    Sept 6, 2016:  [name] [name] is seen today for a recent episode of paroxysmal atrial fib August 21 was shopping , felt very weak .   Could not walk to her car.  Very short of breath .  She went to [place] a day or so  later.  Gradually had worsening chest tightness, allergy, weakness.  Thought it was allergies.  Had significant leg swelling .   She went to the hospital. She was found have rapid atrial fibrillation with evidence of congestive heart failure. Her echocardiogram showed normal left ventricle systolic function. She has mild mitral regurgitation. Was significant calcification of the mitral valve annulus. She was started on Lovenox. She's not received Coumadin at this point.  She was started on diltiazem and Lasix.  Was started on Lovenox ( because of the atrial fib in the setting of MR and itral valve calcification .   She seems to be doing quite a lot at her. She's been going to doctors appointments and has been walking across the parking lot  without any CP with problems.  Current Outpatient Prescriptions on File Prior to Visit  Medication Sig Dispense Refill  . ALPRAZolam (XANAX) 0.5 MG tablet Take 1 tablet (0.5 mg total) by mouth at bedtime as needed for anxiety or sleep. 30 tablet 3  . aspirin-acetaminophen-caffeine (EXCEDRIN MIGRAINE) 798-921-19 MG per tablet Take 1 tablet by mouth every 6 (six) hours as  needed.      Marland Kitchen b complex vitamins tablet Take 1 tablet by mouth daily.      . butalbital-acetaminophen-caffeine (FIORICET, ESGIC) 50-325-40 MG per tablet TAKE 1 TABLET BY MOUTH AS NEEDED FOR BACK PAIN OR HEADACHE 90 tablet 0  . calcium citrate (CALCITRATE - DOSED IN MG ELEMENTAL CALCIUM) 950 MG tablet Take 200 mg of elemental calcium by mouth 3 (three) times daily with meals.    Marland Kitchen CARTIA XT 240 MG 24 hr capsule TK 1 C PO QD  0  . celecoxib (CELEBREX) 100 MG capsule Take 1 capsule (100 mg total) by mouth 2 (two) times daily. 20 capsule 0  . Cholecalciferol (VITAMIN D) 1000 UNITS capsule Take 1,000 Units by mouth daily.      . CRESTOR 5 MG tablet TAKE 1 TABLET BY MOUTH EVERY DAY (Patient taking differently: TAKE 1 TABLET BY MOUTH EVERY other day) 30 tablet 3  . cyclobenzaprine (FLEXERIL) 5 MG  tablet Take 1 tablet (5 mg total) by mouth 3 (three) times daily as needed for muscle spasms. 30 tablet 0  . enoxaparin (LOVENOX) 100 MG/ML injection Inject 70 mg into the skin every 12 (twelve) hours.    . furosemide (LASIX) 40 MG tablet Take 40 mg by mouth daily as needed for fluid or edema.     . Ibuprofen (MOTRIN PO) Take by mouth as needed (AS NEEDED FOR HEADACHE AND PAIN).     Marland Kitchen montelukast (SINGULAIR) 10 MG tablet Take 10 mg by mouth at bedtime.      . Multiple Vitamin (MULTIVITAMIN) tablet Take 1 tablet by mouth daily.      . NON FORMULARY Take by mouth daily as needed (AS NEEDED FOR CONSTIPATION). citrocel as needed.    . Omega-3 Fatty Acids (FISH OIL PO) Take by mouth daily.      . Probiotic Product (PROBIOTIC PO) Take by mouth daily.      . vitamin C (ASCORBIC ACID) 500 MG tablet Take 500 mg by mouth daily.      . vitamin E 100 UNIT capsule Take 400 Units by mouth daily.       No current facility-administered medications on file prior to visit.    Allergies  Allergen Reactions  . Codeine Other (See Comments)    migraine  . Doxycycline Itching  . Hydrocodone Nausea And Vomiting  . Hydromorphone Nausea And Vomiting  . Meloxicam   . Oxycodone     Past Medical History  Diagnosis Date  . SVT (supraventricular tachycardia)   . MVP (mitral valve prolapse)   . Hyperlipidemia   . Idiopathic thrombocytopenic purpura (ITP)   . Hemorrhoid   . IBS (irritable bowel syndrome)   . Migraine     Past Surgical History  Procedure Laterality Date  . Tubal ligation    . Foot surgery    . Varicose vein surgery    . Mandible fracture surgery  03-26-13  . Colonoscopy  2003  . Eye surgery Right 2013  . Breast biopsy      History  Smoking status  . Never Smoker   Smokeless tobacco  . Never Used    Comment: Second-hand exposure at her work 2ppd for 3 years    History  Alcohol Use  . 1.8 oz/week  . 3 Standard drinks or equivalent per week    Comment: occasionally    Family  History  Problem Relation Age of Onset  . Hypertension Mother   . Arrhythmia Mother   . Heart  failure Mother   . Arrhythmia Brother   . Stroke Brother 46    cerebral hemorrhage, nonsmoker, no HTN  . Stroke Father     from an aneurysm  . Stroke Maternal Aunt 83    cerebral hemorrhage  . Liver cancer Maternal Grandmother   . Atrial fibrillation Son     Reviw of Systems:  Reviewed in the HPI.  All other systems are negative.  Physical Exam: Blood pressure 90/66, pulse 106, height 5\' 6"  (1.676 m), weight 64.501 kg (142 lb 3.2 oz), SpO2 99 %. General: Well developed, well nourished, in no acute distress. Head: Normocephalic, atraumatic, sclera non-icteric, mucus membranes are moist,  Neck: Supple. Carotids are 2 + without bruits. No JVD Lungs: Clear bilaterally to auscultation. Heart: irreg. Irreg.  normal  S1 S2. She has a mid- late systolic click. There is a 2/6 systolic ejection murmur at the axilla.    Abdomen: Soft, non-tender, non-distended with normal bowel sounds. No hepatomegaly. No rebound/guarding. No masses. Msk:  Strength and tone are normal Extremities: No clubbing or cyanosis. No edema.  Distal pedal pulses are 2+ and equal bilaterally. Neuro: Alert and oriented X 3. Moves all extremities spontaneously. Psych:  Responds to questions appropriately with a normal affect.  ECG:  Assessment / Plan:   1. Atrial fibrillation with rapid ventricular response: The patient was recently found to have rapid atrial fibrillation. She started on Cardizem and Lasix and feels quite a bit better.  She's not needed Lasix for the past several days. Her heart rate is still fairly fast. She's been taking Lovenox for the past several days and she's not started on Coumadin yet.  Because her heart rate is fast right to stop the diltiazem and start her on metoprolol 50 mg twice a day. We will stop the Lovenox and start her on Eliquis 5 mg twice a day. We will see her as an office visit next  week. Anticipate doing a cardioversion at some point. We may also want to do a transesophageal echocardiogram and we may be able to combine these.   3. Congestive heart failure: The patient presented with signs and symptoms of congestive heart failure. An echocardiogram that revealed normal left ventricle systolic function. The diastolic function could not be assessed.  She has mild mitral regurgitation and mitral valve prolapse. She's much better on the current dose of Lasix. Continue current medications. I'll see her next week.   Nahser, Wonda Cheng, MD  08/08/2015 4:50 PM    [address],  [place], [place]  [address] Pager [phone] Phone: [phone]; Fax: [phone]   [facility]  [address] [place], [place]  [address] [phone]   Fax [phone]

## 2015-08-15 ENCOUNTER — Encounter: Payer: Self-pay | Admitting: Nurse Practitioner

## 2015-08-15 ENCOUNTER — Ambulatory Visit
Admission: RE | Admit: 2015-08-15 | Discharge: 2015-08-15 | Disposition: A | Discharge status: Home / Self Care | Payer: Medicare Other | Source: Ambulatory Visit | Attending: Pulmonary Disease | Admitting: Pulmonary Disease

## 2015-08-15 ENCOUNTER — Encounter: Payer: Self-pay | Admitting: Cardiovascular Disease

## 2015-08-15 ENCOUNTER — Telehealth: Payer: Self-pay | Admitting: Pulmonary Disease

## 2015-08-15 ENCOUNTER — Ambulatory Visit (INDEPENDENT_AMBULATORY_CARE_PROVIDER_SITE_OTHER): Payer: Medicare Other | Admitting: Cardiovascular Disease

## 2015-08-15 VITALS — BP 100/70 | HR 80 | Ht 66.0 in | Wt 139.0 lb

## 2015-08-15 DIAGNOSIS — R918 Other nonspecific abnormal finding of lung field: Secondary | ICD-10-CM

## 2015-08-15 DIAGNOSIS — K769 Liver disease, unspecified: Secondary | ICD-10-CM | POA: Insufficient documentation

## 2015-08-15 DIAGNOSIS — J189 Pneumonia, unspecified organism: Secondary | ICD-10-CM | POA: Diagnosis not present

## 2015-08-15 DIAGNOSIS — I4891 Unspecified atrial fibrillation: Secondary | ICD-10-CM

## 2015-08-15 DIAGNOSIS — I517 Cardiomegaly: Secondary | ICD-10-CM | POA: Insufficient documentation

## 2015-08-15 DIAGNOSIS — I481 Persistent atrial fibrillation: Secondary | ICD-10-CM | POA: Diagnosis not present

## 2015-08-15 DIAGNOSIS — I4819 Other persistent atrial fibrillation: Secondary | ICD-10-CM

## 2015-08-15 DIAGNOSIS — R911 Solitary pulmonary nodule: Secondary | ICD-10-CM | POA: Insufficient documentation

## 2015-08-15 LAB — CBC WITH DIFFERENTIAL/PLATELET
Basophils Absolute: 0 10*3/uL (ref 0.0–0.1)
Basophils Relative: 0.6 % (ref 0.0–3.0)
Eosinophils Absolute: 0.1 10*3/uL (ref 0.0–0.7)
Eosinophils Relative: 1.5 % (ref 0.0–5.0)
HCT: 38.4 % (ref 36.0–46.0)
Hemoglobin: 12.7 g/dL (ref 12.0–15.0)
Lymphocytes Relative: 25.4 % (ref 12.0–46.0)
Lymphs Abs: 1.6 10*3/uL (ref 0.7–4.0)
MCHC: 33.1 g/dL (ref 30.0–36.0)
MCV: 91.7 fl (ref 78.0–100.0)
Monocytes Absolute: 0.4 10*3/uL (ref 0.1–1.0)
Monocytes Relative: 6.5 % (ref 3.0–12.0)
Neutro Abs: 4.2 10*3/uL (ref 1.4–7.7)
Neutrophils Relative %: 66 % (ref 43.0–77.0)
Platelets: 203 10*3/uL (ref 150.0–400.0)
RBC: 4.18 Mil/uL (ref 3.87–5.11)
RDW: 13.8 % (ref 11.5–15.5)
WBC: 6.3 10*3/uL (ref 4.0–10.5)

## 2015-08-15 LAB — BASIC METABOLIC PANEL
BUN: 24 mg/dL — ABNORMAL HIGH (ref 6–23)
CO2: 27 mEq/L (ref 19–32)
Calcium: 9.3 mg/dL (ref 8.4–10.5)
Chloride: 102 mEq/L (ref 96–112)
Creatinine, Ser: 0.83 mg/dL (ref 0.40–1.20)
GFR: 71.26 mL/min (ref >60.00)
Glucose, Bld: 100 mg/dL — ABNORMAL HIGH (ref 70–99)
Potassium: 4.6 mEq/L (ref 3.5–5.1)
Sodium: 136 mEq/L (ref 135–145)

## 2015-08-15 LAB — PROTIME-INR
INR: 1.4 ratio — ABNORMAL HIGH (ref 0.8–1.0)
Prothrombin Time: 15.6 s — ABNORMAL HIGH (ref 9.6–13.1)

## 2015-08-15 MED ORDER — ALBUTEROL SULFATE HFA 108 (90 BASE) MCG/ACT IN AERS: 2.0000 | INHALATION_SPRAY | Freq: Four times a day (QID) | RESPIRATORY_TRACT | Status: DC | PRN

## 2015-08-15 NOTE — Telephone Encounter (Signed)
JN kept the disc. He didn't give it to me after seeing the pt that day. Thanks.

## 2015-08-15 NOTE — Patient Instructions (Signed)
Medication Instructions:  Your physician recommends that you continue on your current medications as directed. Please refer to the Current Medication list given to you today.   Labwork: TODAY - pre-procedure labs:  basic metabolic panel, cbc, pt/inr   Testing/Procedures: Your physician has requested that you have a TEE/Cardioversion. During a TEE, sound waves are used to create images of your heart. It provides your doctor with information about the size and shape of your heart and how well your heart's chambers and valves are working. In this test, a transducer is attached to the end of a flexible tube that is guided down you throat and into your esophagus (the tube leading from your mouth to your stomach) to get a more detailed image of your heart. Once the TEE has determined that a blood clot is not present, the cardioversion begins. Electrical Cardioversion uses a jolt of electricity to your heart either through paddles or wired patches attached to your chest. This is a controlled, usually prescheduled, procedure. This procedure is done at the hospital and you are not awake during the procedure. You usually go home the day of the procedure. Please see the instruction sheet given to you today for more information.    Follow-Up: Your physician recommends that you schedule a follow-up appointment in: 3 months with Dr. Acie Fredrickson.

## 2015-08-15 NOTE — Telephone Encounter (Signed)
Called and spoke to [place] at [facility] CT. Glenard Haring is requesting the older CT chest the pt had for the radiologist to compare to her newer CT chest. Pt was seen on 9.6.2016 by Dr. Ashok Cordia and brought disc with old CT on it with her to [place].   Misty please advise if you know where this CT is, as you work with Durene Cal that day. Thanks.

## 2015-08-15 NOTE — Telephone Encounter (Signed)
Dr. Ashok Cordia please advise if you still have pt's CT chest disc. Thanks.

## 2015-08-15 NOTE — Addendum Note (Signed)
Addended by: Thayer Headings on: 08/15/2015 05:43 PM   Modules accepted: Orders

## 2015-08-15 NOTE — Progress Notes (Signed)
[name] [name] Date of Birth  06-13-1941       [facility] [address], [address], [place], [place]  [address]   [place], [place]  [address] [phone]     [phone]   Fax  [phone]    Fax [phone]  Problem List: 1. Supraventricular tachycardia 2. Mitral valve prolapse 3. Cataracts  History of Present Illness:  [name] [name] is a  74 yo with hx of SVT and MVP.  She needs to have cataract surgery and is hear for pre op evaluation.  She has done well.  She has not been exercising as much as she would like.  She has not had any syncope or presyncope.  She has been taking care of her granddaughter who has ADD and a sleep disorder.  [name] [name] did not get much sleep that weekend and had lots of palpitations for the following week.  Oct. 7, 2014  [name] [name] is seen after a 1 1/2 year absence.  She has had some fatigue.   Dr. Derrel Nip has found thrombocytopenia.  Crestor was stopped but this did not help the thrombocytopenia.   She has seen a hemotologist.    She fell  (caught her toe at a [employer] - not syncope) several months ago and had reconstruction of her jaw.  She also broke her left wrist.     Oct. 2, 2015 :  [name] [name] is doing well from a cardiac standpoint.  Her cholesterol levels are slightly elevated ( even on Crestor 5 mg a day).  She has had marginally low platelets .  Her cholesterol levels are still minimally elevated.    She had some dyspnea this summer.  Currently , she has no limitations.  She thinks the symptoms may have been due to allergies.  No chest pain.    She has had several falls this year .  She thinks that her stamina is not as good as it should be.  She does yoga but no cardio workouts.    Sept 6, 2016:  [name] [name] is seen today for a recent episode of paroxysmal atrial fib August 21 was shopping , felt very weak .   Could not walk to her car.  Very short of breath .  She went to [place] a day or so  later.  Gradually had worsening chest tightness, allergy, weakness.  Thought it was allergies.  Had significant leg swelling .   She went to the hospital. She was found have rapid atrial fibrillation with evidence of congestive heart failure. Her echocardiogram showed normal left ventricle systolic function. She has mild mitral regurgitation. Was significant calcification of the mitral valve annulus. She was started on Lovenox. She's not received Coumadin at this point.  She was started on diltiazem and Lasix.  Was started on Lovenox ( because of the atrial fib in the setting of MR and itral valve calcification .   She seems to be doing quite a lot at her. She's been going to doctors appointments and has been walking across the parking lot  without any CP with problems.  Sept. 13, 2016:  [name] [name] is seen back for a 1 week visit. She was found to have rapid atrial fib last week Had some dyspnea last night - better with a Xopenex sample inhaler.  Feels better on the Eliquis and Metoprolol .  Feels more relaxed.  Cannot tell if she is in atrial fib Has  low energy .  Difficult to walk to the mailbox without extreme fatigue , not specifically short of breath    Current Outpatient Prescriptions on File Prior to Visit  Medication Sig Dispense Refill  . ALPRAZolam (XANAX) 0.5 MG tablet Take 1 tablet (0.5 mg total) by mouth at bedtime as needed for anxiety or sleep. 30 tablet 3  . apixaban (ELIQUIS) 5 MG TABS tablet Take 1 tablet (5 mg total) by mouth 2 (two) times daily. 60 tablet 11  . aspirin-acetaminophen-caffeine (EXCEDRIN MIGRAINE) 858-850-27 MG per tablet Take 1 tablet by mouth every 6 (six) hours as needed.      Marland Kitchen b complex vitamins tablet Take 1 tablet by mouth daily.      . butalbital-acetaminophen-caffeine (FIORICET, ESGIC) 50-325-40 MG per tablet TAKE 1 TABLET BY MOUTH AS NEEDED FOR BACK PAIN OR HEADACHE 90 tablet 0  . calcium citrate (CALCITRATE - DOSED IN MG ELEMENTAL CALCIUM) 950 MG  tablet Take 200 mg of elemental calcium by mouth 3 (three) times daily with meals.    . Cholecalciferol (VITAMIN D) 1000 UNITS capsule Take 1,000 Units by mouth daily.      . furosemide (LASIX) 40 MG tablet Take 40 mg by mouth daily as needed for fluid or edema.     . Ibuprofen (MOTRIN PO) Take by mouth as needed (AS NEEDED FOR HEADACHE AND PAIN).     Marland Kitchen metoprolol (LOPRESSOR) 50 MG tablet Take 1 tablet (50 mg total) by mouth 2 (two) times daily. 180 tablet 3  . montelukast (SINGULAIR) 10 MG tablet Take 10 mg by mouth at bedtime.      . Multiple Vitamin (MULTIVITAMIN) tablet Take 1 tablet by mouth daily.      . NON FORMULARY Take 3 tablets by mouth daily as needed (AS NEEDED FOR CONSTIPATION). citrocel    . Omega-3 Fatty Acids (FISH OIL PO) Take by mouth daily.      . Probiotic Product (PROBIOTIC PO) Take by mouth daily.      . rosuvastatin (CRESTOR) 5 MG tablet Take 1 tablet (5 mg total) by mouth every other day. 45 tablet 3  . vitamin C (ASCORBIC ACID) 500 MG tablet Take 500 mg by mouth daily.      . vitamin E 100 UNIT capsule Take 400 Units by mouth daily.       No current facility-administered medications on file prior to visit.    Allergies  Allergen Reactions  . Oxycodone-Acetaminophen Other (See Comments)    Other Reaction: migraine  . Codeine Other (See Comments)    migraine  . Doxycycline Itching  . Hydrocodone Nausea And Vomiting  . Hydromorphone Nausea And Vomiting  . Meloxicam   . Oxycodone     Past Medical History  Diagnosis Date  . SVT (supraventricular tachycardia)   . MVP (mitral valve prolapse)   . Hyperlipidemia   . Idiopathic thrombocytopenic purpura (ITP)   . Hemorrhoid   . IBS (irritable bowel syndrome)   . Migraine     Past Surgical History  Procedure Laterality Date  . Tubal ligation    . Foot surgery    . Varicose vein surgery    . Mandible fracture surgery  03-26-13  . Colonoscopy  2003  . Eye surgery Right 2013  . Breast biopsy      History    Smoking status  . Never Smoker   Smokeless tobacco  . Never Used    Comment: Second-hand exposure at her work 2ppd for 3 years  History  Alcohol Use  . 1.8 oz/week  . 3 Standard drinks or equivalent per week    Comment: occasionally    Family History  Problem Relation Age of Onset  . Hypertension Mother   . Arrhythmia Mother   . Heart failure Mother   . Arrhythmia Brother   . Stroke Brother 8    cerebral hemorrhage, nonsmoker, no HTN  . Stroke Father     from an aneurysm  . Stroke Maternal Aunt 83    cerebral hemorrhage  . Liver cancer Maternal Grandmother   . Atrial fibrillation Son   . Heart attack Neg Hx     Reviw of Systems:  Reviewed in the HPI.  All other systems are negative.  Physical Exam: Blood pressure 100/70, pulse 80, height 5\' 6"  (1.676 m), weight 63.05 kg (139 lb). General: Well developed, well nourished, in no acute distress. Head: Normocephalic, atraumatic, sclera non-icteric, mucus membranes are moist,  Neck: Supple. Carotids are 2 + without bruits. No JVD Lungs: Clear bilaterally to auscultation. Heart: irreg. Irreg.  normal  S1 S2. She has a mid- late systolic click. There is a 2/6 systolic ejection murmur at the axilla.    Abdomen: Soft, non-tender, non-distended with normal bowel sounds. No hepatomegaly. No rebound/guarding. No masses. Msk:  Strength and tone are normal Extremities: No clubbing or cyanosis. No edema.  Distal pedal pulses are 2+ and equal bilaterally. Neuro: Alert and oriented X 3. Moves all extremities spontaneously. Psych:  Responds to questions appropriately with a normal affect.  ECG:  Assessment / Plan:   1. Persistent Atrial fibrillation with rapid ventricular response: The patient was recently found to have rapid atrial fibrillation.  She's feeling better on the metoprolol and Eliquis. She still has lots of fatigue. I'll  schedule her for a transesophageal echo guided cardioversion on September 20.  3. Congestive  heart failure: The patient presented with signs and symptoms of congestive heart failure. An echocardiogram that revealed normal left ventricle systolic function. The diastolic function could not be assessed.  She has mild mitral regurgitation and mitral valve prolapse. She's much better on the current dose of Lasix. Continue current medications. I'll see her next week.    Annabel Gibeau, Wonda Cheng, MD  08/15/2015 11:59 AM    [address] [place],  [place], [place]  [address] Pager [phone] Phone: [phone]; Fax: [phone]   [facility]  [address] [place],   [address] [phone]   Fax [phone]

## 2015-08-16 NOTE — Telephone Encounter (Signed)
I would have given the CT back to the patient. I do not keep patient's discs.  JN

## 2015-08-16 NOTE — Telephone Encounter (Signed)
Spoke with Glenard Haring at [facility] CT.  Advised that Dr Ashok Cordia did not keep disc and would have given this back to pt. She verbalized understanding.  Nothing further needed.

## 2015-08-18 ENCOUNTER — Ambulatory Visit (INDEPENDENT_AMBULATORY_CARE_PROVIDER_SITE_OTHER): Payer: Medicare Other | Admitting: Internal Medicine

## 2015-08-18 ENCOUNTER — Telehealth: Payer: Self-pay | Admitting: Pulmonary Disease

## 2015-08-18 ENCOUNTER — Encounter: Payer: Self-pay | Admitting: Internal Medicine

## 2015-08-18 ENCOUNTER — Telehealth: Payer: Self-pay

## 2015-08-18 VITALS — BP 118/78 | HR 67 | Temp 97.6°F | Resp 12 | Ht 65.5 in | Wt 137.5 lb

## 2015-08-18 DIAGNOSIS — I4891 Unspecified atrial fibrillation: Secondary | ICD-10-CM

## 2015-08-18 DIAGNOSIS — R918 Other nonspecific abnormal finding of lung field: Secondary | ICD-10-CM | POA: Diagnosis not present

## 2015-08-18 DIAGNOSIS — R0600 Dyspnea, unspecified: Secondary | ICD-10-CM

## 2015-08-18 DIAGNOSIS — Z0000 Encounter for general adult medical examination without abnormal findings: Secondary | ICD-10-CM

## 2015-08-18 DIAGNOSIS — R0609 Other forms of dyspnea: Secondary | ICD-10-CM | POA: Diagnosis not present

## 2015-08-18 DIAGNOSIS — R911 Solitary pulmonary nodule: Secondary | ICD-10-CM

## 2015-08-18 MED ORDER — BUTALBITAL-ACETAMINOPHEN 50-300 MG PO TABS: 1.0000 | ORAL_TABLET | Freq: Every day | ORAL | Status: DC | PRN

## 2015-08-18 MED ORDER — ALPRAZOLAM 0.25 MG PO TABS: 0.2500 mg | ORAL_TABLET | Freq: Every evening | ORAL | Status: DC | PRN

## 2015-08-18 NOTE — Progress Notes (Signed)
Patient ID: [name] [name], female    DOB: 1941-04-18  Age: 74 y.o. MRN: [id number]  The patient is here for annual Medicare wellness examination and management of other chronic and acute problems.   The risk factors are reflected in the social history.  The roster of all physicians providing medical care to patient - is listed in the Snapshot section of the chart.  Activities of daily living:  The patient is 100% independent in all ADLs: dressing, toileting, feeding as well as independent mobility  Home safety : The patient has smoke detectors in the home. They wear seatbelts.  There are no firearms at home. There is no violence in the home.   There is no risks for hepatitis, STDs or HIV. There is no   history of blood transfusion. They have no travel history to infectious disease endemic areas of the world.  The patient has seen their dentist in the last six month. They have seen their eye doctor in the last year. They admit to slight hearing difficulty with regard to whispered voices and some television programs.  They have deferred audiologic testing in the last year.  They do not  have excessive sun exposure. Discussed the need for sun protection: hats, long sleeves and use of sunscreen if there is significant sun exposure.   Diet: the importance of a healthy diet is discussed. They do have a healthy diet.  The benefits of regular aerobic exercise were discussed. She walks 4 times per week ,  20 minutes.   Depression screen: there are no signs or vegative symptoms of depression- irritability, change in appetite, anhedonia, sadness/tearfullness.  Cognitive assessment: the patient manages all their financial and personal affairs and is actively engaged. They could relate day,date,year and events; recalled 2/3 objects at 3 minutes; performed clock-face test normally.  The following portions of the patient's history were reviewed and updated as appropriate: allergies, current medications,  past family history, past medical history,  past surgical history, past social history  and problem list.  Visual acuity was not assessed per patient preference since she has regular follow up with her ophthalmologist. Hearing and body mass index were assessed and reviewed.   During the course of the visit the patient was educated and counseled about appropriate screening and preventive services including : fall prevention , diabetes screening, nutrition counseling, colorectal cancer screening, and recommended immunizations.    CC: The primary encounter diagnosis was Exertional dyspnea. Diagnoses of Atrial fibrillation, unspecified, Medicare annual wellness visit, subsequent, Lung mass, and Atrial fibrillation with RVR were also pertinent to this visit.  Fatigue and exertional dyspnea since she developed atrial fibrillation on August 21 , TEE and cardioversion are   planned .  Feels wobbly.  Has not been able to participate in yoga anymore.  She had a CT of the chest done last week by pulmonology  to investigate the pulmonary mass that was seen.  But has not received the results yet.  The CT results were reveiwed with patient today    History Haya has a past medical history of SVT (supraventricular tachycardia); MVP (mitral valve prolapse); Hyperlipidemia; Idiopathic thrombocytopenic purpura (ITP); Hemorrhoid; IBS (irritable bowel syndrome); and Migraine.   She has past surgical history that includes Tubal ligation; Foot surgery; Varicose vein surgery; Mandible fracture surgery (03-26-13); Colonoscopy (2003); Eye surgery (Right, 2013); and Breast biopsy.   Her family history includes Arrhythmia in her brother and mother; Atrial fibrillation in her son; Heart failure in her mother; Hypertension  in her mother; Liver cancer in her maternal grandmother; Stroke in her father; Stroke (age of onset: 14) in her brother; Stroke (age of onset: 9) in her maternal aunt. There is no history of Heart attack.She  reports that she has never smoked. She has never used smokeless tobacco. She reports that she drinks about 1.8 oz of alcohol per week. She reports that she does not use illicit drugs.  Outpatient Prescriptions Prior to Visit  Medication Sig Dispense Refill  . albuterol (PROVENTIL HFA;VENTOLIN HFA) 108 (90 BASE) MCG/ACT inhaler Inhale 2 puffs into the lungs every 6 (six) hours as needed for wheezing or shortness of breath. 1 Inhaler 2  . apixaban (ELIQUIS) 5 MG TABS tablet Take 1 tablet (5 mg total) by mouth 2 (two) times daily. 60 tablet 11  . aspirin-acetaminophen-caffeine (EXCEDRIN MIGRAINE) 762-831-51 MG per tablet Take 1 tablet by mouth every 6 (six) hours as needed.      . butalbital-acetaminophen-caffeine (FIORICET, ESGIC) 50-325-40 MG per tablet TAKE 1 TABLET BY MOUTH AS NEEDED FOR BACK PAIN OR HEADACHE 90 tablet 0  . metoprolol (LOPRESSOR) 50 MG tablet Take 1 tablet (50 mg total) by mouth 2 (two) times daily. 180 tablet 3  . montelukast (SINGULAIR) 10 MG tablet Take 10 mg by mouth at bedtime.      . Multiple Vitamin (MULTIVITAMIN) tablet Take 1 tablet by mouth daily.      . Omega-3 Fatty Acids (FISH OIL PO) Take by mouth daily.      . Probiotic Product (PROBIOTIC PO) Take by mouth daily.      . rosuvastatin (CRESTOR) 5 MG tablet Take 1 tablet (5 mg total) by mouth every other day. 45 tablet 3  . b complex vitamins tablet Take 1 tablet by mouth daily.      . calcium citrate (CALCITRATE - DOSED IN MG ELEMENTAL CALCIUM) 950 MG tablet Take 200 mg of elemental calcium by mouth 3 (three) times daily with meals.    . Cholecalciferol (VITAMIN D) 1000 UNITS capsule Take 1,000 Units by mouth daily.      . furosemide (LASIX) 40 MG tablet Take 40 mg by mouth daily as needed for fluid or edema.     . Ibuprofen (MOTRIN PO) Take by mouth as needed (AS NEEDED FOR HEADACHE AND PAIN).     . NON FORMULARY Take 3 tablets by mouth daily as needed (AS NEEDED FOR CONSTIPATION). citrocel    . vitamin C (ASCORBIC  ACID) 500 MG tablet Take 500 mg by mouth daily.      . vitamin E 100 UNIT capsule Take 400 Units by mouth daily.      Marland Kitchen ALPRAZolam (XANAX) 0.5 MG tablet Take 1 tablet (0.5 mg total) by mouth at bedtime as needed for anxiety or sleep. (Patient not taking: Reported on 08/18/2015) 30 tablet 3   No facility-administered medications prior to visit.    Review of Systems   Patient denies headache, fevers, malaise, unintentional weight loss, skin rash, eye pain, sinus congestion and sinus pain, sore throat, dysphagia,  hemoptysis , cough, dyspnea, wheezing, chest pain, palpitations, orthopnea, edema, abdominal pain, nausea, melena, diarrhea, constipation, flank pain, dysuria, hematuria, urinary  Frequency, nocturia, numbness, tingling, seizures,  Focal weakness, Loss of consciousness,  Tremor, insomnia, depression, anxiety, and suicidal ideation.      Objective:  BP 118/78 mmHg  Pulse 67  Temp(Src) 97.6 F (36.4 C) (Oral)  Resp 12  Ht 5' 5.5" (1.664 m)  Wt 137 lb 8 oz (62.37 kg)  BMI 22.53 kg/m2  SpO2 99%  LMP  (Exact Date)  Physical Exam   General appearance: alert, cooperative and appears stated age Head: Normocephalic, without obvious abnormality, atraumatic Eyes: conjunctivae/corneas clear. PERRL, EOM's intact. Fundi benign. Ears: normal TM's and external ear canals both ears Nose: Nares normal. Septum midline. Mucosa normal. No drainage or sinus tenderness. Throat: lips, mucosa, and tongue normal; teeth and gums normal Neck: no adenopathy, no carotid bruit, no JVD, supple, symmetrical, trachea midline and thyroid not enlarged, symmetric, no tenderness/mass/nodules Lungs: clear to auscultation bilaterally Breasts: normal appearance, no masses or tenderness Heart: regular rate and rhythm, S1, S2 normal, no murmur, click, rub or gallop Abdomen: soft, non-tender; bowel sounds normal; no masses,  no organomegaly Extremities: extremities normal, atraumatic, no cyanosis or edema Pulses:  2+ and symmetric Skin: Skin color, texture, turgor normal. No rashes or lesions Neurologic: Alert and oriented X 3, normal strength and tone. Normal symmetric reflexes. Normal coordination and gait.    Assessment & Plan:   Problem List Items Addressed This Visit      Unprioritized   Medicare annual wellness visit, subsequent    Annual Medicare wellness  exam was done as well as a comprehensive physical exam and management of acute and chronic conditions .  During the course of the visit the patient was educated and counseled about appropriate screening and preventive services including : fall prevention , diabetes screening, nutrition counseling, colorectal cancer screening, and recommended immunizations.  Printed recommendations for health maintenance screenings was given.       Atrial fibrillation with RVR    Her HR is not controlled on exam today.  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 Mabie ALPRAZolam. I am also having her start on Butalbital-Acetaminophen. Additionally, I am having her maintain her multivitamin, b complex vitamins, montelukast, vitamin C, Vitamin D, vitamin E, aspirin-acetaminophen-caffeine, Ibuprofen (MOTRIN PO), Probiotic Product (PROBIOTIC PO), Omega-3 Fatty Acids (FISH OIL PO), NON FORMULARY, calcium citrate, butalbital-acetaminophen-caffeine, furosemide, metoprolol, apixaban, rosuvastatin, and albuterol.  Meds ordered this encounter  Medications  . Butalbital-Acetaminophen 50-300 MG TABS    Sig: Take 1 tablet by mouth daily as needed.    Dispense:  90 tablet    Refill:  1   . ALPRAZolam (XANAX) 0.25 MG tablet    Sig: Take 1 tablet (0.25 mg total) by mouth at bedtime as needed for anxiety or sleep.    Dispense:  30 tablet    Refill:  5    Medications Discontinued During This Encounter  Medication Reason  . ALPRAZolam (XANAX) 0.5 MG tablet Reorder    Follow-up: No Follow-up on file.   [name], MD

## 2015-08-18 NOTE — Progress Notes (Signed)
Pre-visit discussion using our clinic review tool. No additional management support is needed unless otherwise documented below in the visit note.  

## 2015-08-18 NOTE — Telephone Encounter (Signed)
Prior Auth for Bupap was denied. Pt has no Rx drug coverage with plan

## 2015-08-18 NOTE — Patient Instructions (Addendum)
Increase the metoprolol to 1.5 tablet twice daily   I have given your a new prescription for fioricet without the caffeine in it   The metoprolol is preventing your headaches!  PT referral to help you get your strength back post cardioversion   Health Maintenance Adopting a healthy lifestyle and getting preventive care can go a long way to promote health and wellness. Talk with your health care provider about what schedule of regular examinations is right for you. This is a good chance for you to check in with your provider about disease prevention and staying healthy. In between checkups, there are plenty of things you can do on your own. Experts have done a lot of research about which lifestyle changes and preventive measures are most likely to keep you healthy. Ask your health care provider for more information. WEIGHT AND DIET  Eat a healthy diet  Be sure to include plenty of vegetables, fruits, low-fat dairy products, and lean protein.  Do not eat a lot of foods high in solid fats, added sugars, or salt.  Get regular exercise. This is one of the most important things you can do for your health.  Most adults should exercise for at least 150 minutes each week. The exercise should increase your heart rate and make you sweat (moderate-intensity exercise).  Most adults should also do strengthening exercises at least twice a week. This is in addition to the moderate-intensity exercise.  Maintain a healthy weight  Body mass index (BMI) is a measurement that can be used to identify possible weight problems. It estimates body fat based on height and weight. Your health care provider can help determine your BMI and help you achieve or maintain a healthy weight.  For females 83 years of age and older:   A BMI below 18.5 is considered underweight.  A BMI of 18.5 to 24.9 is normal.  A BMI of 25 to 29.9 is considered overweight.  A BMI of 30 and above is considered obese.  Watch levels  of cholesterol and blood lipids  You should start having your blood tested for lipids and cholesterol at 75 years of age, then have this test every 5 years.  You may need to have your cholesterol levels checked more often if:  Your lipid or cholesterol levels are high.  You are older than 75 years of age.  You are at high risk for heart disease.  CANCER SCREENING   Lung Cancer  Lung cancer screening is recommended for adults 6-16 years old who are at high risk for lung cancer because of a history of smoking.  A yearly low-dose CT scan of the lungs is recommended for people who:  Currently smoke.  Have quit within the past 15 years.  Have at least a 30-pack-year history of smoking. A pack year is smoking an average of one pack of cigarettes a day for 1 year.  Yearly screening should continue until it has been 15 years since you quit.  Yearly screening should stop if you develop a health problem that would prevent you from having lung cancer treatment.  Breast Cancer  Practice breast self-awareness. This means understanding how your breasts normally appear and feel.  It also means doing regular breast self-exams. Let your health care provider know about any changes, no matter how small.  If you are in your 20s or 30s, you should have a clinical breast exam (CBE) by a health care provider every 1-3 years as part of a regular  health exam.  If you are 40 or older, have a CBE every year. Also consider having a breast X-ray (mammogram) every year.  If you have a family history of breast cancer, talk to your health care provider about genetic screening.  If you are at high risk for breast cancer, talk to your health care provider about having an MRI and a mammogram every year.  Breast cancer gene (BRCA) assessment is recommended for women who have family members with BRCA-related cancers. BRCA-related cancers include:  Breast.  Ovarian.  Tubal.  Peritoneal  cancers.  Results of the assessment will determine the need for genetic counseling and BRCA1 and BRCA2 testing. Cervical Cancer Routine pelvic examinations to screen for cervical cancer are no longer recommended for nonpregnant women who are considered low risk for cancer of the pelvic organs (ovaries, uterus, and vagina) and who do not have symptoms. A pelvic examination may be necessary if you have symptoms including those associated with pelvic infections. Ask your health care provider if a screening pelvic exam is right for you.   The Pap test is the screening test for cervical cancer for women who are considered at risk.  If you had a hysterectomy for a problem that was not cancer or a condition that could lead to cancer, then you no longer need Pap tests.  If you are older than 65 years, and you have had normal Pap tests for the past 10 years, you no longer need to have Pap tests.  If you have had past treatment for cervical cancer or a condition that could lead to cancer, you need Pap tests and screening for cancer for at least 20 years after your treatment.  If you no longer get a Pap test, assess your risk factors if they change (such as having a new sexual partner). This can affect whether you should start being screened again.  Some women have medical problems that increase their chance of getting cervical cancer. If this is the case for you, your health care provider may recommend more frequent screening and Pap tests.  The human papillomavirus (HPV) test is another test that may be used for cervical cancer screening. The HPV test looks for the virus that can cause cell changes in the cervix. The cells collected during the Pap test can be tested for HPV.  The HPV test can be used to screen women 25 years of age and older. Getting tested for HPV can extend the interval between normal Pap tests from three to five years.  An HPV test also should be used to screen women of any age who  have unclear Pap test results.  After 75 years of age, women should have HPV testing as often as Pap tests.  Colorectal Cancer  This type of cancer can be detected and often prevented.  Routine colorectal cancer screening usually begins at 75 years of age and continues through 75 years of age.  Your health care provider may recommend screening at an earlier age if you have risk factors for colon cancer.  Your health care provider may also recommend using home test kits to check for hidden blood in the stool.  A small camera at the end of a tube can be used to examine your colon directly (sigmoidoscopy or colonoscopy). This is done to check for the earliest forms of colorectal cancer.  Routine screening usually begins at age 33.  Direct examination of the colon should be repeated every 5-10 years through 75 years  of age. However, you may need to be screened more often if early forms of precancerous polyps or small growths are found. Skin Cancer  Check your skin from head to toe regularly.  Tell your health care provider about any new moles or changes in moles, especially if there is a change in a mole's shape or color.  Also tell your health care provider if you have a mole that is larger than the size of a pencil eraser.  Always use sunscreen. Apply sunscreen liberally and repeatedly throughout the day.  Protect yourself by wearing long sleeves, pants, a wide-brimmed hat, and sunglasses whenever you are outside. HEART DISEASE, DIABETES, AND HIGH BLOOD PRESSURE   Have your blood pressure checked at least every 1-2 years. High blood pressure causes heart disease and increases the risk of stroke.  If you are between 49 years and 39 years old, ask your health care provider if you should take aspirin to prevent strokes.  Have regular diabetes screenings. This involves taking a blood sample to check your fasting blood sugar level.  If you are at a normal weight and have a low risk for  diabetes, have this test once every three years after 75 years of age.  If you are overweight and have a high risk for diabetes, consider being tested at a younger age or more often. PREVENTING INFECTION  Hepatitis B  If you have a higher risk for hepatitis B, you should be screened for this virus. You are considered at high risk for hepatitis B if:  You were born in a country where hepatitis B is common. Ask your health care provider which countries are considered high risk.  Your parents were born in a high-risk country, and you have not been immunized against hepatitis B (hepatitis B vaccine).  You have HIV or AIDS.  You use needles to inject street drugs.  You live with someone who has hepatitis B.  You have had sex with someone who has hepatitis B.  You get hemodialysis treatment.  You take certain medicines for conditions, including cancer, organ transplantation, and autoimmune conditions. Hepatitis C  Blood testing is recommended for:  Everyone born from [birth year] through 1966.  Anyone with known risk factors for hepatitis C. Sexually transmitted infections (STIs)  You should be screened for sexually transmitted infections (STIs) including gonorrhea and chlamydia if:  You are sexually active and are younger than 75 years of age.  You are older than 75 years of age and your health care provider tells you that you are at risk for this type of infection.  Your sexual activity has changed since you were last screened and you are at an increased risk for chlamydia or gonorrhea. Ask your health care provider if you are at risk.  If you do not have HIV, but are at risk, it may be recommended that you take a prescription medicine daily to prevent HIV infection. This is called pre-exposure prophylaxis (PrEP). You are considered at risk if:  You are sexually active and do not regularly use condoms or know the HIV status of your partner(s).  You take drugs by injection.  You are  sexually active with a partner who has HIV. Talk with your health care provider about whether you are at high risk of being infected with HIV. If you choose to begin PrEP, you should first be tested for HIV. You should then be tested every 3 months for as long as you are taking PrEP.  PREGNANCY  If you are premenopausal and you may become pregnant, ask your health care provider about preconception counseling.  If you may become pregnant, take 400 to 800 micrograms (mcg) of folic acid every day.  If you want to prevent pregnancy, talk to your health care provider about birth control (contraception). OSTEOPOROSIS AND MENOPAUSE   Osteoporosis is a disease in which the bones lose minerals and strength with aging. This can result in serious bone fractures. Your risk for osteoporosis can be identified using a bone density scan.  If you are 73 years of age or older, or if you are at risk for osteoporosis and fractures, ask your health care provider if you should be screened.  Ask your health care provider whether you should take a calcium or vitamin D supplement to lower your risk for osteoporosis.  Menopause may have certain physical symptoms and risks.  Hormone replacement therapy may reduce some of these symptoms and risks. Talk to your health care provider about whether hormone replacement therapy is right for you.  HOME CARE INSTRUCTIONS   Schedule regular health, dental, and eye exams.  Stay current with your immunizations.   Do not use any tobacco products including cigarettes, chewing tobacco, or electronic cigarettes.  If you are pregnant, do not drink alcohol.  If you are breastfeeding, limit how much and how often you drink alcohol.  Limit alcohol intake to no more than 1 drink per day for nonpregnant women. One drink equals 12 ounces of beer, 5 ounces of wine, or 1 ounces of hard liquor.  Do not use street drugs.  Do not share needles.  Ask your health care provider for  help if you need support or information about quitting drugs.  Tell your health care provider if you often feel depressed.  Tell your health care provider if you have ever been abused or do not feel safe at home. Document Released: 06/03/2011 Document Revised: 04/04/2014 Document Reviewed: 10/20/2013 [facility] Patient Information 2015 [place], [place]. This information is not intended to replace advice given to you by your health care provider. Make sure you discuss any questions you have with your health care provider.

## 2015-08-18 NOTE — Telephone Encounter (Signed)
Spoke with pt. Pt had CT scan of the chest on 9.13.16 and pt is requesting to speak with JN regarding the results. Pt can be reached at [phone] at any time during the day. Pt has a pending appt with JN on 10.11.16 and would like to discuss the results sooner.   JN please advise. Thanks.

## 2015-08-20 NOTE — Assessment & Plan Note (Signed)
Repeat CT suggests pneumonitis in RML  And 6 mm nodule in the RLL that needs follow up CT with 6-12 months

## 2015-08-20 NOTE — Assessment & Plan Note (Addendum)
Her HR is not controlled on exam today.  I have increased the metoprolol dose and ordered cardioplumonary PT

## 2015-08-20 NOTE — Assessment & Plan Note (Signed)

## 2015-08-21 ENCOUNTER — Telehealth: Payer: Self-pay | Admitting: Pulmonary Disease

## 2015-08-21 ENCOUNTER — Encounter: Payer: Self-pay | Admitting: Internal Medicine

## 2015-08-21 NOTE — Telephone Encounter (Signed)
Spoke with the patient regarding her CT results. Please schedule a full PFT at her follow-up appointment with me.

## 2015-08-21 NOTE — Telephone Encounter (Signed)
Spoke with patient relaying results of CT scan. RLL nodule that is sub-cm & minimal medial segment RML linear opacity that could represent scarring. Patient to bring her outside hospital CT with her to her next appointment with me for comparison. No chest pain or dyspnea. Denies any cough. She is also concerned about her liver findings noted on CT imaging. She reports she has had only rare, intermittent dyspnea at night that is relieved with albuterol. Plan for full PFT at her next appointment with me in October.

## 2015-08-21 NOTE — Telephone Encounter (Signed)
pft scheduled. nothing further needed

## 2015-08-22 ENCOUNTER — Ambulatory Visit (HOSPITAL_BASED_OUTPATIENT_CLINIC_OR_DEPARTMENT_OTHER): Payer: Medicare Other

## 2015-08-22 ENCOUNTER — Ambulatory Visit (HOSPITAL_COMMUNITY): Payer: Medicare Other | Admitting: Anesthesiology

## 2015-08-22 ENCOUNTER — Ambulatory Visit (HOSPITAL_COMMUNITY)
Admission: RE | Admit: 2015-08-22 | Discharge: 2015-08-22 | Disposition: A | Discharge status: Home / Self Care | Payer: Medicare Other | Source: Ambulatory Visit | Attending: Cardiovascular Disease | Admitting: Cardiovascular Disease

## 2015-08-22 ENCOUNTER — Encounter (HOSPITAL_COMMUNITY)
Admission: RE | Disposition: A | Discharge status: Home / Self Care | Payer: Self-pay | Source: Ambulatory Visit | Attending: Cardiovascular Disease

## 2015-08-22 ENCOUNTER — Encounter (HOSPITAL_COMMUNITY): Payer: Self-pay | Admitting: *Deleted

## 2015-08-22 DIAGNOSIS — I4891 Unspecified atrial fibrillation: Secondary | ICD-10-CM

## 2015-08-22 DIAGNOSIS — I739 Peripheral vascular disease, unspecified: Secondary | ICD-10-CM | POA: Diagnosis not present

## 2015-08-22 DIAGNOSIS — Z7982 Long term (current) use of aspirin: Secondary | ICD-10-CM | POA: Diagnosis not present

## 2015-08-22 DIAGNOSIS — Z7901 Long term (current) use of anticoagulants: Secondary | ICD-10-CM | POA: Insufficient documentation

## 2015-08-22 DIAGNOSIS — H269 Unspecified cataract: Secondary | ICD-10-CM | POA: Insufficient documentation

## 2015-08-22 DIAGNOSIS — I10 Essential (primary) hypertension: Secondary | ICD-10-CM | POA: Insufficient documentation

## 2015-08-22 DIAGNOSIS — D693 Immune thrombocytopenic purpura: Secondary | ICD-10-CM | POA: Diagnosis not present

## 2015-08-22 DIAGNOSIS — I959 Hypotension, unspecified: Secondary | ICD-10-CM | POA: Diagnosis not present

## 2015-08-22 DIAGNOSIS — G43909 Migraine, unspecified, not intractable, without status migrainosus: Secondary | ICD-10-CM | POA: Diagnosis not present

## 2015-08-22 DIAGNOSIS — F419 Anxiety disorder, unspecified: Secondary | ICD-10-CM | POA: Insufficient documentation

## 2015-08-22 DIAGNOSIS — I481 Persistent atrial fibrillation: Secondary | ICD-10-CM | POA: Diagnosis not present

## 2015-08-22 DIAGNOSIS — I509 Heart failure, unspecified: Secondary | ICD-10-CM | POA: Diagnosis not present

## 2015-08-22 DIAGNOSIS — J45909 Unspecified asthma, uncomplicated: Secondary | ICD-10-CM | POA: Diagnosis not present

## 2015-08-22 DIAGNOSIS — K589 Irritable bowel syndrome without diarrhea: Secondary | ICD-10-CM | POA: Insufficient documentation

## 2015-08-22 DIAGNOSIS — Z79899 Other long term (current) drug therapy: Secondary | ICD-10-CM | POA: Insufficient documentation

## 2015-08-22 DIAGNOSIS — I471 Supraventricular tachycardia: Secondary | ICD-10-CM | POA: Diagnosis not present

## 2015-08-22 DIAGNOSIS — E785 Hyperlipidemia, unspecified: Secondary | ICD-10-CM | POA: Insufficient documentation

## 2015-08-22 DIAGNOSIS — I341 Nonrheumatic mitral (valve) prolapse: Secondary | ICD-10-CM | POA: Insufficient documentation

## 2015-08-22 DIAGNOSIS — I340 Nonrheumatic mitral (valve) insufficiency: Secondary | ICD-10-CM | POA: Insufficient documentation

## 2015-08-22 HISTORY — PX: CARDIOVERSION: SHX1299

## 2015-08-22 HISTORY — PX: TEE WITHOUT CARDIOVERSION: SHX5443

## 2015-08-22 SURGERY — CARDIOVERSION
Anesthesia: Monitor Anesthesia Care

## 2015-08-22 MED ORDER — EPHEDRINE SULFATE 50 MG/ML IJ SOLN
INTRAMUSCULAR | Status: DC | PRN
  Administered 2015-08-22 (×2): 10 mg via INTRAVENOUS

## 2015-08-22 MED ORDER — PHENYLEPHRINE HCL 10 MG/ML IJ SOLN
INTRAMUSCULAR | Status: DC | PRN
  Administered 2015-08-22 (×2): 40 ug via INTRAVENOUS

## 2015-08-22 MED ORDER — ROCURONIUM BROMIDE 50 MG/5ML IV SOLN
INTRAVENOUS | Status: AC
  Filled 2015-08-22: qty 1

## 2015-08-22 MED ORDER — SODIUM CHLORIDE 0.9 % IV SOLN
INTRAVENOUS | Status: DC
Start: 2015-08-22 — End: 2015-08-22
  Administered 2015-08-22: 500 mL via INTRAVENOUS
  Administered 2015-08-22: 13:00:00 via INTRAVENOUS

## 2015-08-22 MED ORDER — EPHEDRINE SULFATE 50 MG/ML IJ SOLN
INTRAMUSCULAR | Status: AC
  Filled 2015-08-22: qty 1

## 2015-08-22 MED ORDER — BUTAMBEN-TETRACAINE-BENZOCAINE 2-2-14 % EX AERO
INHALATION_SPRAY | CUTANEOUS | Status: DC | PRN
  Administered 2015-08-22: 2 via TOPICAL

## 2015-08-22 MED ORDER — METOPROLOL TARTRATE 50 MG PO TABS: 75.0000 mg | ORAL_TABLET | Freq: Two times a day (BID) | ORAL | Status: DC

## 2015-08-22 MED ORDER — PROPOFOL 10 MG/ML IV BOLUS
INTRAVENOUS | Status: AC
  Filled 2015-08-22: qty 20

## 2015-08-22 MED ORDER — METOPROLOL TARTRATE 50 MG PO TABS: 25.0000 mg | ORAL_TABLET | Freq: Two times a day (BID) | ORAL | Status: DC

## 2015-08-22 MED ORDER — PHENYLEPHRINE 40 MCG/ML (10ML) SYRINGE FOR IV PUSH (FOR BLOOD PRESSURE SUPPORT)
PREFILLED_SYRINGE | INTRAVENOUS | Status: AC
  Filled 2015-08-22: qty 10

## 2015-08-22 MED ORDER — SUCCINYLCHOLINE CHLORIDE 20 MG/ML IJ SOLN
INTRAMUSCULAR | Status: AC
  Filled 2015-08-22: qty 1

## 2015-08-22 MED ORDER — LACTATED RINGERS IV SOLN
INTRAVENOUS | Status: DC | PRN
  Administered 2015-08-22: 14:00:00 via INTRAVENOUS

## 2015-08-22 MED ORDER — SODIUM CHLORIDE 0.9 % IJ SOLN
INTRAMUSCULAR | Status: AC
  Filled 2015-08-22: qty 10

## 2015-08-22 MED ORDER — PROPOFOL 10 MG/ML IV BOLUS
INTRAVENOUS | Status: DC | PRN
  Administered 2015-08-22: 20 mg via INTRAVENOUS
  Administered 2015-08-22: 70 mg via INTRAVENOUS
  Administered 2015-08-22: 50 mg via INTRAVENOUS

## 2015-08-22 MED ORDER — FENTANYL CITRATE (PF) 100 MCG/2ML IJ SOLN
INTRAMUSCULAR | Status: DC | PRN
  Administered 2015-08-22: 50 ug via INTRAVENOUS

## 2015-08-22 MED ORDER — MIDAZOLAM HCL 5 MG/5ML IJ SOLN
INTRAMUSCULAR | Status: DC | PRN
  Administered 2015-08-22 (×2): 1 mg via INTRAVENOUS

## 2015-08-22 NOTE — H&P (View-Only) (Signed)
[name] [name] Date of Birth  25-Feb-1941       [facility] Office [address], [address], [place], [place]  [address]   [place], [place]  [address] [phone]     [phone]   Fax  [phone]    Fax [phone]  Problem List: 1. Supraventricular tachycardia 2. Mitral valve prolapse 3. Cataracts  History of Present Illness:  [name] [name] is a  74 yo with hx of SVT and MVP.  She needs to have cataract surgery and is hear for pre op evaluation.  She has done well.  She has not been exercising as much as she would like.  She has not had any syncope or presyncope.  She has been taking care of her granddaughter who has ADD and a sleep disorder.  [name] [name] did not get much sleep that weekend and had lots of palpitations for the following week.  Oct. 7, 2014  [name] [name] is seen after a 1 1/2 year absence.  She has had some fatigue.   Dr. Derrel Nip has found thrombocytopenia.  Crestor was stopped but this did not help the thrombocytopenia.   She has seen a hemotologist.    She fell  (caught her toe at a [employer] - not syncope) several months ago and had reconstruction of her jaw.  She also broke her left wrist.     Oct. 2, 2015 :  [name] [name] is doing well from a cardiac standpoint.  Her cholesterol levels are slightly elevated ( even on Crestor 5 mg a day).  She has had marginally low platelets .  Her cholesterol levels are still minimally elevated.    She had some dyspnea this summer.  Currently , she has no limitations.  She thinks the symptoms may have been due to allergies.  No chest pain.    She has had several falls this year .  She thinks that her stamina is not as good as it should be.  She does yoga but no cardio workouts.    Sept 6, 2016:  [name] [name] is seen today for a recent episode of paroxysmal atrial fib August 21 was shopping , felt very weak .   Could not walk to her car.  Very short of breath .  She went to [place] a day or so  later.  Gradually had worsening chest tightness, allergy, weakness.  Thought it was allergies.  Had significant leg swelling .   She went to the hospital. She was found have rapid atrial fibrillation with evidence of congestive heart failure. Her echocardiogram showed normal left ventricle systolic function. She has mild mitral regurgitation. Was significant calcification of the mitral valve annulus. She was started on Lovenox. She's not received Coumadin at this point.  She was started on diltiazem and Lasix.  Was started on Lovenox ( because of the atrial fib in the setting of MR and itral valve calcification .   She seems to be doing quite a lot at her. She's been going to doctors appointments and has been walking across the parking lot  without any CP with problems.  Sept. 13, 2016:  [name] [name] is seen back for a 1 week visit. She was found to have rapid atrial fib last week Had some dyspnea last night - better with a Xopenex sample inhaler.  Feels better on the Eliquis and Metoprolol .  Feels more relaxed.  Cannot tell if she is in atrial fib Has  low energy .  Difficult to walk to the mailbox without extreme fatigue , not specifically short of breath    Current Outpatient Prescriptions on File Prior to Visit  Medication Sig Dispense Refill  . ALPRAZolam (XANAX) 0.5 MG tablet Take 1 tablet (0.5 mg total) by mouth at bedtime as needed for anxiety or sleep. 30 tablet 3  . apixaban (ELIQUIS) 5 MG TABS tablet Take 1 tablet (5 mg total) by mouth 2 (two) times daily. 60 tablet 11  . aspirin-acetaminophen-caffeine (EXCEDRIN MIGRAINE) 712-458-09 MG per tablet Take 1 tablet by mouth every 6 (six) hours as needed.      Marland Kitchen b complex vitamins tablet Take 1 tablet by mouth daily.      . butalbital-acetaminophen-caffeine (FIORICET, ESGIC) 50-325-40 MG per tablet TAKE 1 TABLET BY MOUTH AS NEEDED FOR BACK PAIN OR HEADACHE 90 tablet 0  . calcium citrate (CALCITRATE - DOSED IN MG ELEMENTAL CALCIUM) 950 MG  tablet Take 200 mg of elemental calcium by mouth 3 (three) times daily with meals.    . Cholecalciferol (VITAMIN D) 1000 UNITS capsule Take 1,000 Units by mouth daily.      . furosemide (LASIX) 40 MG tablet Take 40 mg by mouth daily as needed for fluid or edema.     . Ibuprofen (MOTRIN PO) Take by mouth as needed (AS NEEDED FOR HEADACHE AND PAIN).     Marland Kitchen metoprolol (LOPRESSOR) 50 MG tablet Take 1 tablet (50 mg total) by mouth 2 (two) times daily. 180 tablet 3  . montelukast (SINGULAIR) 10 MG tablet Take 10 mg by mouth at bedtime.      . Multiple Vitamin (MULTIVITAMIN) tablet Take 1 tablet by mouth daily.      . NON FORMULARY Take 3 tablets by mouth daily as needed (AS NEEDED FOR CONSTIPATION). citrocel    . Omega-3 Fatty Acids (FISH OIL PO) Take by mouth daily.      . Probiotic Product (PROBIOTIC PO) Take by mouth daily.      . rosuvastatin (CRESTOR) 5 MG tablet Take 1 tablet (5 mg total) by mouth every other day. 45 tablet 3  . vitamin C (ASCORBIC ACID) 500 MG tablet Take 500 mg by mouth daily.      . vitamin E 100 UNIT capsule Take 400 Units by mouth daily.       No current facility-administered medications on file prior to visit.    Allergies  Allergen Reactions  . Oxycodone-Acetaminophen Other (See Comments)    Other Reaction: migraine  . Codeine Other (See Comments)    migraine  . Doxycycline Itching  . Hydrocodone Nausea And Vomiting  . Hydromorphone Nausea And Vomiting  . Meloxicam   . Oxycodone     Past Medical History  Diagnosis Date  . SVT (supraventricular tachycardia)   . MVP (mitral valve prolapse)   . Hyperlipidemia   . Idiopathic thrombocytopenic purpura (ITP)   . Hemorrhoid   . IBS (irritable bowel syndrome)   . Migraine     Past Surgical History  Procedure Laterality Date  . Tubal ligation    . Foot surgery    . Varicose vein surgery    . Mandible fracture surgery  03-26-13  . Colonoscopy  2003  . Eye surgery Right 2013  . Breast biopsy      History    Smoking status  . Never Smoker   Smokeless tobacco  . Never Used    Comment: Second-hand exposure at her work 2ppd for 3 years  History  Alcohol Use  . 1.8 oz/week  . 3 Standard drinks or equivalent per week    Comment: occasionally    Family History  Problem Relation Age of Onset  . Hypertension Mother   . Arrhythmia Mother   . Heart failure Mother   . Arrhythmia Brother   . Stroke Brother 73    cerebral hemorrhage, nonsmoker, no HTN  . Stroke Father     from an aneurysm  . Stroke Maternal Aunt 83    cerebral hemorrhage  . Liver cancer Maternal Grandmother   . Atrial fibrillation Son   . Heart attack Neg Hx     Reviw of Systems:  Reviewed in the HPI.  All other systems are negative.  Physical Exam: Blood pressure 100/70, pulse 80, height 5\' 6"  (1.676 m), weight 63.05 kg (139 lb). General: Well developed, well nourished, in no acute distress. Head: Normocephalic, atraumatic, sclera non-icteric, mucus membranes are moist,  Neck: Supple. Carotids are 2 + without bruits. No JVD Lungs: Clear bilaterally to auscultation. Heart: irreg. Irreg.  normal  S1 S2. She has a mid- late systolic click. There is a 2/6 systolic ejection murmur at the axilla.    Abdomen: Soft, non-tender, non-distended with normal bowel sounds. No hepatomegaly. No rebound/guarding. No masses. Msk:  Strength and tone are normal Extremities: No clubbing or cyanosis. No edema.  Distal pedal pulses are 2+ and equal bilaterally. Neuro: Alert and oriented X 3. Moves all extremities spontaneously. Psych:  Responds to questions appropriately with a normal affect.  ECG:  Assessment / Plan:   1. Persistent Atrial fibrillation with rapid ventricular response: The patient was recently found to have rapid atrial fibrillation.  She's feeling better on the metoprolol and Eliquis. She still has lots of fatigue. I'll  schedule her for a transesophageal echo guided cardioversion on September 20.  3. Congestive  heart failure: The patient presented with signs and symptoms of congestive heart failure. An echocardiogram that revealed normal left ventricle systolic function. The diastolic function could not be assessed.  She has mild mitral regurgitation and mitral valve prolapse. She's much better on the current dose of Lasix. Continue current medications. I'll see her next week.    Nahser, Wonda Cheng, MD  08/15/2015 11:59 AM    [address],  [place], [place]  [address] Pager [phone] Phone: [phone]; Fax: [phone]   [facility]  [address] [place], [place]  [address] [phone]   Fax [phone]

## 2015-08-22 NOTE — Discharge Instructions (Signed)

## 2015-08-22 NOTE — Transfer of Care (Signed)
Immediate Anesthesia Transfer of Care Note  Patient: [name] [name]  Procedure(s) Performed: Procedure(s): CARDIOVERSION (N/A) TRANSESOPHAGEAL ECHOCARDIOGRAM (TEE) (N/A)  Patient Location: PACU and Endoscopy Unit  Anesthesia Type:MAC and General  Level of Consciousness: sedated  Airway & Oxygen Therapy: Patient Spontanous Breathing and Patient connected to nasal cannula oxygen  Post-op Assessment: Report given to RN and Post -op Vital signs reviewed and stable  Post vital signs: Reviewed and stable  Last Vitals:  Filed Vitals:   08/22/15 1414  Pulse:   Temp: 36.4 C  Resp:     Complications: No apparent anesthesia complications

## 2015-08-22 NOTE — Anesthesia Postprocedure Evaluation (Signed)
  Anesthesia Post-op Note  Patient: [name] [name]  Procedure(s) Performed: Procedure(s): CARDIOVERSION (N/A) TRANSESOPHAGEAL ECHOCARDIOGRAM (TEE) (N/A)  Patient Location: Endoscopy Unit  Anesthesia Type:MAC  Level of Consciousness: awake, alert , oriented and patient cooperative  Airway and Oxygen Therapy: Patient Spontanous Breathing  Post-op Pain: none  Post-op Assessment: Post-op Vital signs reviewed, Patient's Cardiovascular Status Stable, Respiratory Function Stable, Patent Airway, No signs of Nausea or vomiting, Adequate PO intake and Pain level controlled              Post-op Vital Signs: Reviewed and stable  Last Vitals:  Filed Vitals:   08/22/15 1500  BP: 94/55  Pulse: 52  Temp:   Resp: 22    Complications: No apparent anesthesia complications

## 2015-08-22 NOTE — Progress Notes (Signed)
  Echocardiogram Echocardiogram Transesophageal has been performed.  Tresa Res 08/22/2015, 2:14 PM

## 2015-08-22 NOTE — Interval H&P Note (Signed)
History and Physical Interval Note:  08/22/2015 1:14 PM  [name] [name]  has presented today for surgery, with the diagnosis of afib  The various methods of treatment have been discussed with the patient and family. After consideration of risks, benefits and other options for treatment, the patient has consented to  Procedure(s): CARDIOVERSION (N/A) TRANSESOPHAGEAL ECHOCARDIOGRAM (TEE) (N/A) as a surgical intervention .  The patient's history has been reviewed, patient examined, no change in status, stable for surgery.  I have reviewed the patient's chart and labs.  Questions were answered to the patient's satisfaction.     Antinette Keough, Wonda Cheng

## 2015-08-22 NOTE — OR Nursing (Signed)
1333 Aborted initial TEE due to hypoxia and the inability to increase O2 saturation.  Pt bagged and neosynepherine given to increase BP,  1337 ultrasound probe placed by Dr. Acie Fredrickson after patient stabilized with at O2 sat of 100% , BP 77/50 1333

## 2015-08-22 NOTE — Anesthesia Preprocedure Evaluation (Addendum)
Anesthesia Evaluation  Patient identified by MRN, date of birth, ID band Patient awake    Reviewed: Allergy & Precautions, NPO status , Patient's Chart, lab work & pertinent test results, reviewed documented beta blocker date and time   History of Anesthesia Complications Negative for: history of anesthetic complications  Airway Mallampati: II  TM Distance: >3 FB Neck ROM: Full    Dental no notable dental hx.    Pulmonary asthma (new onset) ,    Pulmonary exam normal breath sounds clear to auscultation       Cardiovascular hypertension, Pt. on home beta blockers and Pt. on medications + Peripheral Vascular Disease  Normal cardiovascular exam+ dysrhythmias Atrial Fibrillation and Supra Ventricular Tachycardia  Rhythm:Irregular Rate:Normal  Normal LVF   Neuro/Psych  Headaches, PSYCHIATRIC DISORDERS Anxiety negative psych ROS   GI/Hepatic negative GI ROS, Neg liver ROS,   Endo/Other  negative endocrine ROS  Renal/GU negative Renal ROS     Musculoskeletal negative musculoskeletal ROS (+)   Abdominal   Peds  Hematology  (+) Blood dyscrasia (eliquis), ,   Anesthesia Other Findings   Reproductive/Obstetrics negative OB ROS                          Anesthesia Physical Anesthesia Plan  ASA: III  Anesthesia Plan: MAC   Post-op Pain Management:    Induction: Intravenous  Airway Management Planned: Mask and Nasal Cannula  Additional Equipment:   Intra-op Plan:   Post-operative Plan:   Informed Consent: I have reviewed the patients History and Physical, chart, labs and discussed the procedure including the risks, benefits and alternatives for the proposed anesthesia with the patient or authorized representative who has indicated his/her understanding and acceptance.   Dental advisory given  Plan Discussed with: CRNA and Surgeon  Anesthesia Plan Comments: (Plan routine monitors, MAC)        Anesthesia Quick Evaluation

## 2015-08-22 NOTE — CV Procedure (Signed)
    Transesophageal Echocardiogram Note  [name] [name] [id number] [birth year]  Procedure: Transesophageal Echocardiogram Indications: atrial fib, MR  Procedure Details Consent: Obtained Time Out: Verified patient identification, verified procedure, site/side was marked, verified correct patient position, special equipment/implants available, Radiology Safety Procedures followed,  medications/allergies/relevent history reviewed, required imaging and test results available.  Performed  Medications: Propofol 140 mg total for TEE and cardioversion   Left Ventrical:  Normal LV function   Mitral Valve: marked prolapse of the posterior and anterior leaflets Severe MR   Aortic Valve: normal   Tricuspid Valve: normal   Pulmonic Valve: normal   Left Atrium/ Left atrial appendage: no thrombi ,  She has LAE,  There is no reversal of flow in the pulmonary veins but I suspect this is due to the large left atrium Atrial septum: no PFO by color flow   Aorta: mild atherosclerosis    Complications: Complications of She is very sensitive to the Propofol.  she had hypotension requiring Neo injections  Patient did tolerate procedure well.      Cardioversion Note  [name] [name] [id number] [birth year]  Procedure: DC Cardioversion Indications: atrial fib   Procedure Details Consent: Obtained Time Out: Verified patient identification, verified procedure, site/side was marked, verified correct patient position, special equipment/implants available, Radiology Safety Procedures followed,  medications/allergies/relevent history reviewed, required imaging and test results available.  Performed  The patient has been on adequate anticoagulation.  The patient received IV propofol ( see above )  for sedation.  Synchronous cardioversion was performed at 120  joules.  The cardioversion was successful     Complications: No apparent complications Patient did tolerate procedure well. She is  now in NSR .Marland Kitchen She has severe MR , LAE  Will see her in several weeks.  Will need to consider MR repair, MAZE procedure.  Will need a cath prior to procedure    Ramond Dial., MD, [facility] 08/22/2015, 2:00 PM

## 2015-08-23 ENCOUNTER — Telehealth: Payer: Self-pay | Admitting: Cardiovascular Disease

## 2015-08-23 NOTE — Telephone Encounter (Signed)
Spoke with patient and scheduled appointment with Dr. Acie Fredrickson for 10/11.  Patient verbalized understanding of instructions given by Dr. Acie Fredrickson following procedure on 9/20.  I advised her to call back with questions or concerns prior to appointment. She thanked me for the call.

## 2015-08-23 NOTE — Telephone Encounter (Signed)
New message      Pt had TEE and cardioversion.  She was instructed to call the nurse to set up the next set of appointments

## 2015-08-23 NOTE — Telephone Encounter (Signed)
Pt now has significant Mitral regurgitation = which has led to her CHF and the atrial fib. She was cardioverted yesterday . She will need to be worked in to my schedule in 3-4 weeks so that we can set up a R and L heart cath. She will need an appt with Dr. Ricard Dillon at [facility] for MV repair and possible MAZE procedure ( probably following the cath )  We decreased her metoprolol to 25 bid - she was very bradycardic following the cardioversion .     Agatha Duplechain, Wonda Cheng, MD  08/23/2015 10:25 AM    [address],  [place], [place]  [address] Pager [phone] Phone: [phone]; Fax: [phone]   [facility]  [address] [place], [place]  [address] [phone]   Fax [phone]

## 2015-08-23 NOTE — Telephone Encounter (Signed)
Left message for patient to call me to arrange appointments

## 2015-08-24 ENCOUNTER — Telehealth: Payer: Self-pay

## 2015-08-24 NOTE — Telephone Encounter (Signed)
Called patient to schedule injection patient stated she prefers to wait until she has an Echo on 09/12/15 at that time she will call the office for appointment for injection.

## 2015-08-24 NOTE — Telephone Encounter (Signed)
Prolia injection in fridge, ready for appt to be made.

## 2015-08-30 ENCOUNTER — Ambulatory Visit: Payer: Medicare Other | Admitting: Cardiovascular Disease

## 2015-09-01 ENCOUNTER — Ambulatory Visit (INDEPENDENT_AMBULATORY_CARE_PROVIDER_SITE_OTHER): Payer: Medicare Other | Admitting: Pulmonary Disease

## 2015-09-01 DIAGNOSIS — R911 Solitary pulmonary nodule: Secondary | ICD-10-CM

## 2015-09-01 LAB — PULMONARY FUNCTION TEST
DL/VA % pred: 103 %
DL/VA: 5.2 ml/min/mmHg/L
DLCO unc % pred: 83 %
DLCO unc: 22.61 ml/min/mmHg
FEF 25-75 Post: 2.51 L/sec
FEF 25-75 Pre: 1.76 L/sec
FEF2575-%Change-Post: 42 %
FEF2575-%Pred-Post: 140 %
FEF2575-%Pred-Pre: 98 %
FEV1-%Change-Post: 8 %
FEV1-%Pred-Post: 94 %
FEV1-%Pred-Pre: 87 %
FEV1-Post: 2.18 L
FEV1-Pre: 2.02 L
FEV1FVC-%Change-Post: 5 %
FEV1FVC-%Pred-Pre: 104 %
FEV6-%Change-Post: 2 %
FEV6-%Pred-Post: 90 %
FEV6-%Pred-Pre: 87 %
FEV6-Post: 2.63 L
FEV6-Pre: 2.57 L
FEV6FVC-%Change-Post: 0 %
FEV6FVC-%Pred-Post: 104 %
FEV6FVC-%Pred-Pre: 104 %
FVC-%Change-Post: 2 %
FVC-%Pred-Post: 86 %
FVC-%Pred-Pre: 84 %
FVC-Post: 2.65 L
FVC-Pre: 2.58 L
Post FEV1/FVC ratio: 82 %
Post FEV6/FVC ratio: 99 %
Pre FEV1/FVC ratio: 78 %
Pre FEV6/FVC Ratio: 99 %
RV % pred: 54 %
RV: 1.3 L
TLC % pred: 71 %
TLC: 3.82 L

## 2015-09-01 NOTE — Progress Notes (Signed)
PFT done today. 

## 2015-09-11 ENCOUNTER — Ambulatory Visit: Payer: Medicare Other | Admitting: Cardiovascular Disease

## 2015-09-12 ENCOUNTER — Ambulatory Visit (INDEPENDENT_AMBULATORY_CARE_PROVIDER_SITE_OTHER): Payer: Medicare Other | Admitting: Pulmonary Disease

## 2015-09-12 ENCOUNTER — Ambulatory Visit: Payer: Medicare Other | Admitting: Cardiovascular Disease

## 2015-09-12 ENCOUNTER — Encounter: Payer: Self-pay | Admitting: Cardiovascular Disease

## 2015-09-12 ENCOUNTER — Ambulatory Visit: Payer: Medicare Other | Admitting: Pulmonary Disease

## 2015-09-12 ENCOUNTER — Encounter: Payer: Self-pay | Admitting: Pulmonary Disease

## 2015-09-12 ENCOUNTER — Ambulatory Visit (INDEPENDENT_AMBULATORY_CARE_PROVIDER_SITE_OTHER): Payer: Medicare Other | Admitting: Cardiovascular Disease

## 2015-09-12 VITALS — BP 102/58 | HR 51 | Ht 66.0 in | Wt 136.8 lb

## 2015-09-12 VITALS — BP 100/64 | HR 51 | Ht 66.0 in | Wt 139.0 lb

## 2015-09-12 DIAGNOSIS — R911 Solitary pulmonary nodule: Secondary | ICD-10-CM

## 2015-09-12 DIAGNOSIS — I4819 Other persistent atrial fibrillation: Secondary | ICD-10-CM

## 2015-09-12 DIAGNOSIS — I341 Nonrheumatic mitral (valve) prolapse: Secondary | ICD-10-CM | POA: Diagnosis not present

## 2015-09-12 DIAGNOSIS — J984 Other disorders of lung: Secondary | ICD-10-CM

## 2015-09-12 DIAGNOSIS — I340 Nonrheumatic mitral (valve) insufficiency: Secondary | ICD-10-CM

## 2015-09-12 DIAGNOSIS — I481 Persistent atrial fibrillation: Secondary | ICD-10-CM

## 2015-09-12 DIAGNOSIS — I4891 Unspecified atrial fibrillation: Secondary | ICD-10-CM

## 2015-09-12 DIAGNOSIS — IMO0001 Reserved for inherently not codable concepts without codable children: Secondary | ICD-10-CM

## 2015-09-12 NOTE — Progress Notes (Signed)
Subjective:    Patient ID: [name] [name], female    DOB: 1941-01-10, 74 y.o.   MRN: [id number]  C.C.:  Follow-up for Right Lung Opacity.  HPI Atrial Fibrillation:  Since last appointment she underwent cardioversion. She has had resolution of her bilateral pleural effusions on repeat imaging. She remains on Eliquis & Lopressor.  Right Lung Opacity:  Found a new questionable 73mm nodule in her right lower lobe on CT scan imaging. She denies any coughing. She denies any dyspnea at rest or on exertion.   Review of Systems She reports improved sinus congestion & post-nasal drainage. She denies any chest tightness or pain. No palpitations. No fever, chills, or sweats.  Allergies  Allergen Reactions  . Oxycodone-Acetaminophen Other (See Comments)    Other Reaction: migraine  . Codeine Other (See Comments)    migraine  . Doxycycline Itching  . Hydrocodone Nausea And Vomiting    MIGRAINE  . Hydromorphone Nausea And Vomiting  . Meloxicam   . Oxycodone    Current Outpatient Prescriptions on File Prior to Visit  Medication Sig Dispense Refill  . albuterol (PROVENTIL HFA;VENTOLIN HFA) 108 (90 BASE) MCG/ACT inhaler Inhale 2 puffs into the lungs every 6 (six) hours as needed for wheezing or shortness of breath. 1 Inhaler 2  . ALPRAZolam (XANAX) 0.25 MG tablet Take 1 tablet (0.25 mg total) by mouth at bedtime as needed for anxiety or sleep. 30 tablet 5  . apixaban (ELIQUIS) 5 MG TABS tablet Take 1 tablet (5 mg total) by mouth 2 (two) times daily. 60 tablet 11  . azelastine (ASTELIN) 0.1 % nasal spray Place 1 spray into both nostrils daily.  5  . butalbital-acetaminophen-caffeine (FIORICET, ESGIC) 50-325-40 MG per tablet TAKE 1 TABLET BY MOUTH AS NEEDED FOR BACK PAIN OR HEADACHE 90 tablet 0  . furosemide (LASIX) 40 MG tablet Take 40 mg by mouth daily as needed for fluid or edema.     . metoprolol (LOPRESSOR) 50 MG tablet Take 0.5 tablets (25 mg total) by mouth 2 (two) times daily. 180 tablet 3    . montelukast (SINGULAIR) 10 MG tablet Take 10 mg by mouth at bedtime.      . rosuvastatin (CRESTOR) 5 MG tablet Take 1 tablet (5 mg total) by mouth every other day. 45 tablet 3   No current facility-administered medications on file prior to visit.   Past Medical History  Diagnosis Date  . SVT (supraventricular tachycardia) ([facility])   . MVP (mitral valve prolapse)   . Hyperlipidemia   . Idiopathic thrombocytopenic purpura (ITP)   . Hemorrhoid   . IBS (irritable bowel syndrome)   . Migraine    Past Surgical History  Procedure Laterality Date  . Tubal ligation    . Foot surgery    . Varicose vein surgery    . Mandible fracture surgery  03-26-13  . Colonoscopy  2003  . Eye surgery Right 2013  . Breast biopsy    . Cardioversion N/A 08/22/2015    Procedure: CARDIOVERSION;  Surgeon: Thayer Headings, MD;  Location: [facility] ENDOSCOPY;  Service: Cardiovascular;  Laterality: N/A;  . Tee without cardioversion N/A 08/22/2015    Procedure: TRANSESOPHAGEAL ECHOCARDIOGRAM (TEE);  Surgeon: Thayer Headings, MD;  Location: [place];  Service: Cardiovascular;  Laterality: N/A;  D[place] Dates with cardioversion     Family History  Problem Relation Age of Onset  . Hypertension Mother   . Arrhythmia Mother   . Heart failure Mother   . Arrhythmia Brother   .  Stroke Brother 81    cerebral hemorrhage, nonsmoker, no HTN  . Stroke Father     from an aneurysm  . Stroke Maternal Aunt 83    cerebral hemorrhage  . Liver cancer Maternal Grandmother   . Atrial fibrillation Son   . Heart attack Neg Hx    Social History   Social History  . Marital Status: Widowed    Spouse Name: Deceased  . Number of Children: N/A  . Years of Education: 6   Social History Main Topics  . Smoking status: Never Smoker   . Smokeless tobacco: Never Used     Comment: Second-hand exposure at her work 2ppd for 3 years  . Alcohol Use: 1.8 oz/week    3 Standard drinks or equivalent per week     Comment: occasionally  . Drug Use:  No  . Sexual Activity: No   Other Topics Concern  . None   Social History Narrative   She is a widow.  Husband died from metastatic renal cell cancer. Originally from [place]. Previously lived in [place] from 1975-[place]. Moved to [place] in 2007. No mold exposure recently but did have it through a prior work exposure in 1993. Has a masters in public health. No bird exposure.      Objective:   Physical Exam Blood pressure 102/58, pulse 51, height 5\' 6"  (1.676 m), weight 136 lb 12.8 oz (62.052 kg), SpO2 95 %. General:  Thin, frail female. Awake. Alert. Integument:  Warm & dry. No rash on exposed skin.  HEENT:  Moist mucus membranes. No nasal turbinate swelling. PERRL. Cardiovascular: Mild bradycardia. Regular rhythm. No edema.  Pulmonary:  Good aeration & clear to auscultation bilaterally. Normal work of breathing on room air.  Abdomen: Soft. Normal bowel sounds. Nontender. Musculoskeletal:  Normal bulk and tone. No joint deformity or effusion appreciated.  PFT 09/01/15: FVC 2.58 L (84%) FEV1 2.02 L (87%) FEV1/FVC 0.78 FEF 25-75 1.76 L (98%) no bronchodilator response TLC 3.82 L (71%) RV 54% ERV 195% DLCO uncorrected 83%   IMAGING CT CHEST W/O 08/15/15 (personally reviewed by me): Multiple cystic lesions within the liver. 6 mm nodule inferior lateral segment right lower lobe. Previously noted central opacification within right upper lobe has resolved. Patient's bilateral pleural effusions have largely resolved with a very tiny amount of pleural effusion still present on the right. There appears to be some slight decrease in the consolidation within the medial segment of the right middle lobe point compared with prior imaging. It's difficult to discern whether or not a lung nodule was present on previous imaging given the compression from prior pleural effusion.   CT CHEST W/ 07/31/15 (reviewed again by me today):  Bilateral pleural effusions right greater than left. No pathologic mediastinal  adenopathy. Patchy opacity in the right middle lobe. With central opacification present within right upper lobe.  CARDIAC TTE (07/31/15): Difficult study. LV normal in size with mild concentric LVH. EF 60%. Unable to assess diastolic function due to atrial fibrillation. Normal regional function. LA & RA severely enlarged. RV normal in size and function. Pulmonary artery systolic pressure 50-53 mmHg. IVC dilated. Aortic valve not well seen. Trace aortic regurgitation. Moderate mitral valve stenosis with mild regurgitation. Mild tricuspid regurgitation. No pulmonic stenosis or regurgitation. No pericardial effusion.  LABS 08/15/15 CBC: 6.3/12.7/38.4/203 BMP: 136/4.6/102/27/24/0.83/100/9.3 INR: 1.4    Assessment & Plan:  74 year old female status post transesophageal echocardiogram and cardioversion in September for her atrial fibrillation. Patient has had significant improvement in her functional  capacity post-cardioversion. I personally reviewed and compared her CT scan from September with her prior CT imaging from August today and return her chest CT scan disc to her. There is resolution of the central opacification in the right upper lobe from prior CT imaging with relative stability of the right middle lung opacity which likely represents scarring. Given the severity of her bilateral pleural effusions lung collapse/compression accounts for my inability to correlate the right lower lobe nodule with her prior CT imaging. Her spirometry today is normal but lung volumes do show mild restriction which is consistent with her underlying LVH. I instructed the patient contact my office if she had any new breathing problems before her next appointment.  1. Right lower lobe nodule: Plan for repeat CT scan of the chest without contrast late December/early January. 2. Mild restrictive lung disease: Likely secondary to underlying LVH. No suggestion of ILD on CT imaging. 3. Follow-up: Patient to return to clinic in  January 2017 or sooner if needed.

## 2015-09-12 NOTE — Patient Instructions (Signed)
1. We will repeat your chest CT scan in late December/early January. 2. I will see you back in January after your CT scan is complete. Please call my office for any further questions or concerns.

## 2015-09-12 NOTE — Progress Notes (Signed)
[name] [name] Date of Birth  15-Feb-1941       [facility] Office [address], [address], [place], [place]  [address]   [place],   [address] [phone]     [phone]   Fax  [phone]    Fax [phone]  Problem List: 1. Supraventricular tachycardia 2. Mitral valve prolapse 3. Cataracts  History of Present Illness:  [name] [name] is a  74 yo with hx of SVT and MVP.  She needs to have cataract surgery and is hear for pre op evaluation.  She has done well.  She has not been exercising as much as she would like.  She has not had any syncope or presyncope.  She has been taking care of her granddaughter who has ADD and a sleep disorder.  [name] [name] did not get much sleep that weekend and had lots of palpitations for the following week.  Oct. 7, 2014  [name] [name] is seen after a 1 1/2 year absence.  She has had some fatigue.   Dr. Derrel Nip has found thrombocytopenia.  Crestor was stopped but this did not help the thrombocytopenia.   She has seen a hemotologist.    She fell  (caught her toe at a [employer] - not syncope) several months ago and had reconstruction of her jaw.  She also broke her left wrist.     Oct. 2, 2015 :  [name] [name] is doing well from a cardiac standpoint.  Her cholesterol levels are slightly elevated ( even on Crestor 5 mg a day).  She has had marginally low platelets .  Her cholesterol levels are still minimally elevated.    She had some dyspnea this summer.  Currently , she has no limitations.  She thinks the symptoms may have been due to allergies.  No chest pain.    She has had several falls this year .  She thinks that her stamina is not as good as it should be.  She does yoga but no cardio workouts.    Sept 6, 2016:  [name] [name] is seen today for a recent episode of paroxysmal atrial fib August 21 was shopping , felt very weak .   Could not walk to her car.  Very short of breath .  She went to [place] a day or so  later.  Gradually had worsening chest tightness, allergy, weakness.  Thought it was allergies.  Had significant leg swelling .   She went to the hospital. She was found have rapid atrial fibrillation with evidence of congestive heart failure. Her echocardiogram showed normal left ventricle systolic function. She has mild mitral regurgitation. Was significant calcification of the mitral valve annulus. She was started on Lovenox. She's not received Coumadin at this point.  She was started on diltiazem and Lasix.  Was started on Lovenox ( because of the atrial fib in the setting of MR and itral valve calcification .   She seems to be doing quite a lot at her. She's been going to doctors appointments and has been walking across the parking lot  without any CP with problems.  Sept. 13, 2016:  [name] [name] is seen back for a 1 week visit. She was found to have rapid atrial fib last week Had some dyspnea last night - better with a Xopenex sample inhaler.  Feels better on the Eliquis and Metoprolol .  Feels more relaxed.  Cannot tell if she is in atrial fib Has  low energy .  Difficult to walk to the mailbox without extreme fatigue , not specifically short of breath   Oct. 11, 2016:  Was cardioverted several weeks ago.  Feeling better every day since that    Current Outpatient Prescriptions on File Prior to Visit  Medication Sig Dispense Refill  . albuterol (PROVENTIL HFA;VENTOLIN HFA) 108 (90 BASE) MCG/ACT inhaler Inhale 2 puffs into the lungs every 6 (six) hours as needed for wheezing or shortness of breath. 1 Inhaler 2  . ALPRAZolam (XANAX) 0.25 MG tablet Take 1 tablet (0.25 mg total) by mouth at bedtime as needed for anxiety or sleep. 30 tablet 5  . apixaban (ELIQUIS) 5 MG TABS tablet Take 1 tablet (5 mg total) by mouth 2 (two) times daily. 60 tablet 11  . butalbital-acetaminophen-caffeine (FIORICET, ESGIC) 50-325-40 MG per tablet TAKE 1 TABLET BY MOUTH AS NEEDED FOR BACK PAIN OR HEADACHE 90 tablet 0   . furosemide (LASIX) 40 MG tablet Take 40 mg by mouth daily as needed for fluid or edema.     . metoprolol (LOPRESSOR) 50 MG tablet Take 0.5 tablets (25 mg total) by mouth 2 (two) times daily. 180 tablet 3  . montelukast (SINGULAIR) 10 MG tablet Take 10 mg by mouth at bedtime.      . rosuvastatin (CRESTOR) 5 MG tablet Take 1 tablet (5 mg total) by mouth every other day. 45 tablet 3   No current facility-administered medications on file prior to visit.    Allergies  Allergen Reactions  . Oxycodone-Acetaminophen Other (See Comments)    Other Reaction: migraine  . Codeine Other (See Comments)    migraine  . Doxycycline Itching  . Hydrocodone Nausea And Vomiting  . Hydromorphone Nausea And Vomiting  . Meloxicam   . Oxycodone     Past Medical History  Diagnosis Date  . SVT (supraventricular tachycardia) ([facility] [facility])   . MVP (mitral valve prolapse)   . Hyperlipidemia   . Idiopathic thrombocytopenic purpura (ITP)   . Hemorrhoid   . IBS (irritable bowel syndrome)   . Migraine     Past Surgical History  Procedure Laterality Date  . Tubal ligation    . Foot surgery    . Varicose vein surgery    . Mandible fracture surgery  03-26-13  . Colonoscopy  2003  . Eye surgery Right 2013  . Breast biopsy    . Cardioversion N/A 08/22/2015    Procedure: CARDIOVERSION;  Surgeon: Thayer Headings, MD;  Location: [facility] ENDOSCOPY;  Service: Cardiovascular;  Laterality: N/A;  . Tee without cardioversion N/A 08/22/2015    Procedure: TRANSESOPHAGEAL ECHOCARDIOGRAM (TEE);  Surgeon: Thayer Headings, MD;  Location: [facility] ENDOSCOPY;  Service: Cardiovascular;  Laterality: N/A;    History  Smoking status  . Never Smoker   Smokeless tobacco  . Never Used    Comment: Second-hand exposure at her work 2ppd for 3 years    History  Alcohol Use  . 1.8 oz/week  . 3 Standard drinks or equivalent per week    Comment: occasionally    Family History  Problem Relation Age of Onset  . Hypertension Mother   .  Arrhythmia Mother   . Heart failure Mother   . Arrhythmia Brother   . Stroke Brother 60    cerebral hemorrhage, nonsmoker, no HTN  . Stroke Father     from an aneurysm  . Stroke Maternal Aunt 83    cerebral hemorrhage  . Liver cancer Maternal Grandmother   . Atrial fibrillation  Son   . Heart attack Neg Hx     Reviw of Systems:  Reviewed in the HPI.  All other systems are negative.  Physical Exam: Blood pressure 100/64, pulse 51, height 5\' 6"  (1.676 m), weight 63.05 kg (139 lb), SpO2 97 %. General: Well developed, well nourished, in no acute distress. Head: Normocephalic, atraumatic, sclera non-icteric, mucus membranes are moist,  Neck: Supple. Carotids are 2 + without bruits. No JVD Lungs: Clear bilaterally to auscultation. Heart: irreg. Irreg.  normal  S1 S2. She has a mid- late systolic click. There is a 2/6 systolic ejection murmur at the axilla.    Abdomen: Soft, non-tender, non-distended with normal bowel sounds. No hepatomegaly. No rebound/guarding. No masses. Msk:  Strength and tone are normal Extremities: No clubbing or cyanosis. No edema.  Distal pedal pulses are 2+ and equal bilaterally. Neuro: Alert and oriented X 3. Moves all extremities spontaneously. Psych:  Responds to questions appropriately with a normal affect.  ECG:  Assessment / Plan:   1. Persistent Atrial fibrillation with rapid ventricular response: she is maintaining NSR   3. Congestive heart failure: has moderate ( possible moderate - severe) MR Has developed atrial fib Has pulmonary HTN  I would like to repeat her echo to see how her heart is doing in NSR. We may need to refer her for cath if the MR is severe.  She may be a candidate for MV repair  3. Mitral regurgitation:  Has at leas moderate MR by TEE . Was thought to be mild by echo in [place], [place] Will repeat the echo now that she is in NSR in 1 month   Stevan Eberwein, Wonda Cheng, MD  09/12/2015 1:38 PM    [place] [place],  [place], [place]  [address] Pager [phone] Phone: [phone]; Fax: [phone]   [facility]  [address] [place], [place]  [address] [phone]   Fax [phone]

## 2015-09-12 NOTE — Patient Instructions (Signed)
Medication Instructions:  Your physician recommends that you continue on your current medications as directed. Please refer to the Current Medication list given to you today.   Labwork: None Ordered   Testing/Procedures: Your physician has requested that you have an echocardiogram in 4 weeks. Echocardiography is a painless test that uses sound waves to create images of your heart. It provides your doctor with information about the size and shape of your heart and how well your heart's chambers and valves are working. This procedure takes approximately one hour. There are no restrictions for this procedure.    Follow-Up: Your physician recommends that you schedule a follow-up appointment in: 5 weeks with Dr. Acie Fredrickson (1 week after echo)

## 2015-09-19 ENCOUNTER — Ambulatory Visit (INDEPENDENT_AMBULATORY_CARE_PROVIDER_SITE_OTHER): Payer: Medicare Other

## 2015-09-19 DIAGNOSIS — M810 Age-related osteoporosis without current pathological fracture: Secondary | ICD-10-CM

## 2015-09-19 MED ORDER — DENOSUMAB 60 MG/ML ~~LOC~~ SOLN
60.0000 mg | Freq: Once | SUBCUTANEOUS | Status: AC
  Administered 2015-09-19: 60 mg via SUBCUTANEOUS

## 2015-09-19 NOTE — Progress Notes (Signed)
Patient came in for Prolia Injection.  Received in subcutaneous tissue of right arm.  Patient tolerated well.

## 2015-10-10 ENCOUNTER — Ambulatory Visit (HOSPITAL_COMMUNITY): Payer: Medicare Other | Attending: Cardiovascular Disease

## 2015-10-10 DIAGNOSIS — I341 Nonrheumatic mitral (valve) prolapse: Secondary | ICD-10-CM | POA: Diagnosis present

## 2015-10-10 DIAGNOSIS — I4819 Other persistent atrial fibrillation: Secondary | ICD-10-CM

## 2015-10-10 DIAGNOSIS — I313 Pericardial effusion (noninflammatory): Secondary | ICD-10-CM | POA: Diagnosis not present

## 2015-10-10 DIAGNOSIS — I481 Persistent atrial fibrillation: Secondary | ICD-10-CM

## 2015-10-10 DIAGNOSIS — I4891 Unspecified atrial fibrillation: Secondary | ICD-10-CM | POA: Insufficient documentation

## 2015-10-10 DIAGNOSIS — I340 Nonrheumatic mitral (valve) insufficiency: Secondary | ICD-10-CM | POA: Diagnosis not present

## 2015-10-10 DIAGNOSIS — I4510 Unspecified right bundle-branch block: Secondary | ICD-10-CM | POA: Insufficient documentation

## 2015-10-10 DIAGNOSIS — E785 Hyperlipidemia, unspecified: Secondary | ICD-10-CM | POA: Insufficient documentation

## 2015-10-10 DIAGNOSIS — I371 Nonrheumatic pulmonary valve insufficiency: Secondary | ICD-10-CM | POA: Insufficient documentation

## 2015-10-12 ENCOUNTER — Encounter: Payer: Self-pay | Admitting: Cardiovascular Disease

## 2015-10-12 ENCOUNTER — Ambulatory Visit (INDEPENDENT_AMBULATORY_CARE_PROVIDER_SITE_OTHER): Payer: Medicare Other | Admitting: Cardiovascular Disease

## 2015-10-12 ENCOUNTER — Encounter: Payer: Self-pay | Admitting: Nurse Practitioner

## 2015-10-12 VITALS — BP 102/74 | HR 55 | Ht 66.0 in | Wt 141.1 lb

## 2015-10-12 DIAGNOSIS — I340 Nonrheumatic mitral (valve) insufficiency: Secondary | ICD-10-CM | POA: Diagnosis not present

## 2015-10-12 LAB — CBC WITH DIFFERENTIAL/PLATELET
Basophils Absolute: 0 10*3/uL (ref 0.0–0.1)
Basophils Relative: 0 % (ref 0–1)
Eosinophils Absolute: 0.1 10*3/uL (ref 0.0–0.7)
Eosinophils Relative: 2 % (ref 0–5)
HCT: 39.1 % (ref 36.0–46.0)
Hemoglobin: 13.1 g/dL (ref 12.0–15.0)
Lymphocytes Relative: 37 % (ref 12–46)
Lymphs Abs: 2 10*3/uL (ref 0.7–4.0)
MCH: 29.8 pg (ref 26.0–34.0)
MCHC: 33.5 g/dL (ref 30.0–36.0)
MCV: 88.9 fL (ref 78.0–100.0)
MPV: 12.3 fL (ref 8.6–12.4)
Monocytes Absolute: 0.4 10*3/uL (ref 0.1–1.0)
Monocytes Relative: 8 % (ref 3–12)
Neutro Abs: 2.8 10*3/uL (ref 1.7–7.7)
Neutrophils Relative %: 53 % (ref 43–77)
Platelets: 137 10*3/uL — ABNORMAL LOW (ref 150–400)
RBC: 4.4 MIL/uL (ref 3.87–5.11)
RDW: 13.9 % (ref 11.5–15.5)
WBC: 5.3 10*3/uL (ref 4.0–10.5)

## 2015-10-12 LAB — BASIC METABOLIC PANEL
BUN: 22 mg/dL (ref 7–25)
CO2: 29 mmol/L (ref 20–31)
Calcium: 9.3 mg/dL (ref 8.6–10.4)
Chloride: 106 mmol/L (ref 98–110)
Creat: 0.75 mg/dL (ref 0.60–0.93)
Glucose, Bld: 94 mg/dL (ref 65–99)
Potassium: 4.3 mmol/L (ref 3.5–5.3)
Sodium: 139 mmol/L (ref 135–146)

## 2015-10-12 NOTE — Patient Instructions (Addendum)
Medication Instructions:  Your physician recommends that you continue on your current medications as directed. Please refer to the Current Medication list given to you today.   Labwork: None Ordered   Testing/Procedures: Your physician has requested that you have a cardiac catheterization. Cardiac catheterization is used to diagnose and/or treat various heart conditions. Doctors may recommend this procedure for a number of different reasons. The most common reason is to evaluate chest pain. Chest pain can be a symptom of coronary artery disease (CAD), and cardiac catheterization can show whether plaque is narrowing or blocking your heart's arteries. This procedure is also used to evaluate the valves, as well as measure the blood flow and oxygen levels in different parts of your heart. For further information please visit [email or web address]. Please follow instruction sheet, as given.    Follow-Up: You have been referred to Dr. Roxy Manns [facility]   Your physician recommends that you schedule a follow-up appointment in: 3 months with Dr. Acie Fredrickson   If you need a refill on your cardiac medications before your next appointment, please call your pharmacy.   Thank you for choosing [facility]! Christen Bame, RN [phone]

## 2015-10-12 NOTE — Progress Notes (Signed)
[name] [name] Date of Birth  15-Feb-1941       [facility] Office [address], [address] [place], [place], [place]  [address]   [place],   [address] [phone]     [phone]   Fax  [phone]    Fax [phone]  Problem List: 1. Supraventricular tachycardia 2. Mitral valve prolapse 3. Cataracts  History of Present Illness:  [name] [name] is a  74 yo with hx of SVT and MVP.  She needs to have cataract surgery and is hear for pre op evaluation.  She has done well.  She has not been exercising as much as she would like.  She has not had any syncope or presyncope.  She has been taking care of her granddaughter who has ADD and a sleep disorder.  [name] [name] did not get much sleep that weekend and had lots of palpitations for the following week.  Oct. 7, 2014  [name] [name] is seen after a 1 1/2 year absence.  She has had some fatigue.   Dr. Derrel Nip has found thrombocytopenia.  Crestor was stopped but this did not help the thrombocytopenia.   She has seen a hemotologist.    She fell  (caught her toe at a [employer] - not syncope) several months ago and had reconstruction of her jaw.  She also broke her left wrist.     Oct. 2, 2015 :  [name] [name] is doing well from a cardiac standpoint.  Her cholesterol levels are slightly elevated ( even on Crestor 5 mg a day).  She has had marginally low platelets .  Her cholesterol levels are still minimally elevated.    She had some dyspnea this summer.  Currently , she has no limitations.  She thinks the symptoms may have been due to allergies.  No chest pain.    She has had several falls this year .  She thinks that her stamina is not as good as it should be.  She does yoga but no cardio workouts.    Sept 6, 2016:  [name] [name] is seen today for a recent episode of paroxysmal atrial fib August 21 was shopping , felt very weak .   Could not walk to her car.  Very short of breath .  She went to [place] a day or so  later.  Gradually had worsening chest tightness, allergy, weakness.  Thought it was allergies.  Had significant leg swelling .   She went to the hospital. She was found have rapid atrial fibrillation with evidence of congestive heart failure. Her echocardiogram showed normal left ventricle systolic function. She has mild mitral regurgitation. Was significant calcification of the mitral valve annulus. She was started on Lovenox. She's not received Coumadin at this point.  She was started on diltiazem and Lasix.  Was started on Lovenox ( because of the atrial fib in the setting of MR and itral valve calcification .   She seems to be doing quite a lot at her. She's been going to doctors appointments and has been walking across the parking lot  without any CP with problems.  Sept. 13, 2016:  [name] [name] is seen back for a 1 week visit. She was found to have rapid atrial fib last week Had some dyspnea last night - better with a Xopenex sample inhaler.  Feels better on the Eliquis and Metoprolol .  Feels more relaxed.  Cannot tell if she is in atrial fib Has  low energy .  Difficult to walk to the mailbox without extreme fatigue , not specifically short of breath   Oct. 11, 2016:  Was cardioverted several weeks ago.  Feeling better every day since that   Nov. 10, 2016:  Has done well.  Steady improvement since  the cardioversion.  Then for the past several days/ weeks has not been doing quite a good Has shortness of breath with showering and doing everyday activities.    Current Outpatient Prescriptions on File Prior to Visit  Medication Sig Dispense Refill  . albuterol (PROVENTIL HFA;VENTOLIN HFA) 108 (90 BASE) MCG/ACT inhaler Inhale 2 puffs into the lungs every 6 (six) hours as needed for wheezing or shortness of breath. 1 Inhaler 2  . ALPRAZolam (XANAX) 0.25 MG tablet Take 1 tablet (0.25 mg total) by mouth at bedtime as needed for anxiety or sleep. 30 tablet 5  . apixaban (ELIQUIS) 5 MG TABS  tablet Take 1 tablet (5 mg total) by mouth 2 (two) times daily. 60 tablet 11  . azelastine (ASTELIN) 0.1 % nasal spray Place 1 spray into both nostrils daily.  5  . butalbital-acetaminophen-caffeine (FIORICET, ESGIC) 50-325-40 MG per tablet TAKE 1 TABLET BY MOUTH AS NEEDED FOR BACK PAIN OR HEADACHE 90 tablet 0  . furosemide (LASIX) 40 MG tablet Take 40 mg by mouth daily as needed for fluid or edema.     . metoprolol (LOPRESSOR) 50 MG tablet Take 0.5 tablets (25 mg total) by mouth 2 (two) times daily. 180 tablet 3  . montelukast (SINGULAIR) 10 MG tablet Take 10 mg by mouth at bedtime.      . rosuvastatin (CRESTOR) 5 MG tablet Take 1 tablet (5 mg total) by mouth every other day. 45 tablet 3   No current facility-administered medications on file prior to visit.    Allergies  Allergen Reactions  . Oxycodone-Acetaminophen Other (See Comments)    Other Reaction: migraine  . Codeine Other (See Comments)    migraine  . Doxycycline Itching  . Hydrocodone Nausea And Vomiting    MIGRAINE  . Hydromorphone Nausea And Vomiting  . Meloxicam   . Oxycodone     Past Medical History  Diagnosis Date  . SVT (supraventricular tachycardia) ([place])   . MVP (mitral valve prolapse)   . Hyperlipidemia   . Idiopathic thrombocytopenic purpura (ITP)   . Hemorrhoid   . IBS (irritable bowel syndrome)   . Migraine     Past Surgical History  Procedure Laterality Date  . Tubal ligation    . Foot surgery    . Varicose vein surgery    . Mandible fracture surgery  03-26-13  . Colonoscopy  2003  . Eye surgery Right 2013  . Breast biopsy    . Cardioversion N/A 08/22/2015    Procedure: CARDIOVERSION;  Surgeon: [name] Headings, MD;  Location: [facility] ENDOSCOPY;  Service: Cardiovascular;  Laterality: N/A;  . Tee without cardioversion N/A 08/22/2015    Procedure: TRANSESOPHAGEAL ECHOCARDIOGRAM (TEE);  Surgeon: [name] Headings, MD;  Location: [place];  Service: Cardiovascular;  Laterality: N/A;  . Tee with  cardioversion      History  Smoking status  . Never Smoker   Smokeless tobacco  . Never Used    Comment: Second-hand exposure at her work 2ppd for 3 years    History  Alcohol Use  . 1.8 oz/week  . 3 Standard drinks or equivalent per week    Comment: occasionally    Family History  Problem Relation Age  of Onset  . Hypertension Mother   . Arrhythmia Mother   . Heart failure Mother   . Arrhythmia Brother   . Stroke Brother 53    cerebral hemorrhage, nonsmoker, no HTN  . Stroke Father     from an aneurysm  . Stroke Maternal Aunt 83    cerebral hemorrhage  . Liver cancer Maternal Grandmother   . Atrial fibrillation Son   . Heart attack Neg Hx     Reviw of Systems:  Reviewed in the HPI.  All other systems are negative.  Physical Exam: Blood pressure 102/74, pulse 55, height 5\' 6"  (1.676 m), weight 141 lb 1.9 oz (64.012 kg), SpO2 99 %. General: Well developed, well nourished, in no acute distress. Head: Normocephalic, atraumatic, sclera non-icteric, mucus membranes are moist,  Neck: Supple. Carotids are 2 + without bruits. No JVD Lungs: Clear bilaterally to auscultation. Heart: irreg. Irreg.  normal  S1 S2. She has a mid- late systolic click. There is a 2/6 systolic ejection murmur at the axilla.    Abdomen: Soft, non-tender, non-distended with normal bowel sounds. No hepatomegaly. No rebound/guarding. No masses. Msk:  Strength and tone are normal Extremities: No clubbing or cyanosis. No edema.  Distal pedal pulses are 2+ and equal bilaterally. Neuro: Alert and oriented X 3. Moves all extremities spontaneously. Psych:  Responds to questions appropriately with a normal affect.  ECG:  Assessment / Plan:   1. Persistent Atrial fibrillation with rapid ventricular response: she is maintaining NSR  2.  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  Transthoracic echo suggest a partially flail leaflet.  Has class 3 CHF- symptoms with every day activities,  Has limited her activities based  on that .   She  Has been hospitalized with CHF ( while out in [place]) and has developed atrial fib - requiring cardioversion  I think she needs a  a cardiac cath with the anticipation that she should be referred for MV replacement /repair .    Orders written for R and L heart cath   3. Congestive heart failure: has moderate ( possible moderate - severe) MR Has developed atrial fib Has pulmonary HTN Takes the lasix PRN.  Given her symptoms of CHF and the atrial fib,  I think we need to consider MVR.  She may be a candidate for MV repair  3. Mitral regurgitation:  Has at least moderate MR by TEE . Was thought to be mild by echo in [place], [place].  Our echo here suggests that it is severe. Will proceed with cardiac cath.   Will arrange for her to see Dr. Roxy Manns several days after her cath   [name] [name], [name] Cheng, MD  10/12/2015 8:55 AM    [address],  [place], [place]  [address] Pager [phone] Phone: [phone]; Fax: [phone]   [facility]  [address] [place], [place]  [address] [phone]   Fax [phone]

## 2015-10-13 LAB — PROTIME-INR
INR: 1.26 (ref <1.50)
Prothrombin Time: 16 seconds — ABNORMAL HIGH (ref 11.6–15.2)

## 2015-10-17 ENCOUNTER — Ambulatory Visit: Payer: Medicare Other | Admitting: Cardiovascular Disease

## 2015-10-18 ENCOUNTER — Encounter (HOSPITAL_COMMUNITY)
Admission: RE | Disposition: A | Discharge status: Home / Self Care | Payer: Self-pay | Source: Ambulatory Visit | Attending: Cardiovascular Disease

## 2015-10-18 ENCOUNTER — Ambulatory Visit (HOSPITAL_COMMUNITY)
Admission: RE | Admit: 2015-10-18 | Discharge: 2015-10-18 | Disposition: A | Discharge status: Home / Self Care | Payer: Medicare Other | Source: Ambulatory Visit | Attending: Cardiovascular Disease | Admitting: Cardiovascular Disease

## 2015-10-18 ENCOUNTER — Encounter (HOSPITAL_COMMUNITY): Payer: Self-pay | Admitting: Cardiovascular Disease

## 2015-10-18 DIAGNOSIS — I480 Paroxysmal atrial fibrillation: Secondary | ICD-10-CM | POA: Diagnosis not present

## 2015-10-18 DIAGNOSIS — G43909 Migraine, unspecified, not intractable, without status migrainosus: Secondary | ICD-10-CM | POA: Insufficient documentation

## 2015-10-18 DIAGNOSIS — Z7901 Long term (current) use of anticoagulants: Secondary | ICD-10-CM | POA: Diagnosis not present

## 2015-10-18 DIAGNOSIS — D693 Immune thrombocytopenic purpura: Secondary | ICD-10-CM | POA: Diagnosis not present

## 2015-10-18 DIAGNOSIS — I509 Heart failure, unspecified: Secondary | ICD-10-CM | POA: Insufficient documentation

## 2015-10-18 DIAGNOSIS — K589 Irritable bowel syndrome without diarrhea: Secondary | ICD-10-CM | POA: Diagnosis not present

## 2015-10-18 DIAGNOSIS — I471 Supraventricular tachycardia: Secondary | ICD-10-CM | POA: Diagnosis not present

## 2015-10-18 DIAGNOSIS — E785 Hyperlipidemia, unspecified: Secondary | ICD-10-CM | POA: Diagnosis not present

## 2015-10-18 DIAGNOSIS — Z823 Family history of stroke: Secondary | ICD-10-CM | POA: Insufficient documentation

## 2015-10-18 DIAGNOSIS — I340 Nonrheumatic mitral (valve) insufficiency: Secondary | ICD-10-CM | POA: Diagnosis not present

## 2015-10-18 DIAGNOSIS — Z8249 Family history of ischemic heart disease and other diseases of the circulatory system: Secondary | ICD-10-CM | POA: Diagnosis not present

## 2015-10-18 HISTORY — PX: CARDIAC CATHETERIZATION: SHX172

## 2015-10-18 HISTORY — DX: Nonrheumatic mitral (valve) insufficiency: I34.0

## 2015-10-18 LAB — POCT I-STAT 3, ART BLOOD GAS (G3+)
Acid-Base Excess: 1 mmol/L (ref 0.0–2.0)
Bicarbonate: 25.3 mEq/L — ABNORMAL HIGH (ref 20.0–24.0)
O2 Saturation: 91 %
TCO2: 26 mmol/L (ref 0–100)
pCO2 arterial: 40.2 mmHg (ref 35.0–45.0)
pH, Arterial: 7.406 (ref 7.350–7.450)
pO2, Arterial: 61 mmHg — ABNORMAL LOW (ref 80.0–100.0)

## 2015-10-18 LAB — POCT I-STAT 3, VENOUS BLOOD GAS (G3P V)
Acid-Base Excess: 1 mmol/L (ref 0.0–2.0)
Bicarbonate: 26.1 mEq/L — ABNORMAL HIGH (ref 20.0–24.0)
O2 Saturation: 65 %
TCO2: 27 mmol/L (ref 0–100)
pCO2, Ven: 43.2 mmHg — ABNORMAL LOW (ref 45.0–50.0)
pH, Ven: 7.389 — ABNORMAL HIGH (ref 7.250–7.300)
pO2, Ven: 34 mmHg (ref 30.0–45.0)

## 2015-10-18 SURGERY — RIGHT/LEFT HEART CATH AND CORONARY ANGIOGRAPHY

## 2015-10-18 MED ORDER — HEPARIN (PORCINE) IN NACL 2-0.9 UNIT/ML-% IJ SOLN
INTRAMUSCULAR | Status: AC
  Filled 2015-10-18: qty 1500

## 2015-10-18 MED ORDER — SODIUM CHLORIDE 0.9 % IV SOLN: INTRAVENOUS | Status: DC

## 2015-10-18 MED ORDER — HEPARIN SODIUM (PORCINE) 1000 UNIT/ML IJ SOLN
INTRAMUSCULAR | Status: DC | PRN
  Administered 2015-10-18: 3000 [IU] via INTRAVENOUS

## 2015-10-18 MED ORDER — LIDOCAINE HCL (PF) 1 % IJ SOLN
INTRAMUSCULAR | Status: AC
  Filled 2015-10-18: qty 30

## 2015-10-18 MED ORDER — SODIUM CHLORIDE 0.9 % IV SOLN: 250.0000 mL | INTRAVENOUS | Status: DC | PRN

## 2015-10-18 MED ORDER — MIDAZOLAM HCL 2 MG/2ML IJ SOLN
INTRAMUSCULAR | Status: AC
  Filled 2015-10-18: qty 2

## 2015-10-18 MED ORDER — SODIUM CHLORIDE 0.9 % IJ SOLN: 3.0000 mL | Freq: Two times a day (BID) | INTRAMUSCULAR | Status: DC

## 2015-10-18 MED ORDER — VERAPAMIL HCL 2.5 MG/ML IV SOLN
INTRAVENOUS | Status: DC | PRN
  Administered 2015-10-18: 12:00:00 via INTRA_ARTERIAL

## 2015-10-18 MED ORDER — SODIUM CHLORIDE 0.9 % WEIGHT BASED INFUSION
3.0000 mL/kg/h | INTRAVENOUS | Status: DC
  Administered 2015-10-18: 3 mL/kg/h via INTRAVENOUS

## 2015-10-18 MED ORDER — SODIUM CHLORIDE 0.9 % WEIGHT BASED INFUSION: 1.0000 mL/kg/h | INTRAVENOUS | Status: DC

## 2015-10-18 MED ORDER — HEPARIN SODIUM (PORCINE) 1000 UNIT/ML IJ SOLN
INTRAMUSCULAR | Status: AC
  Filled 2015-10-18: qty 1

## 2015-10-18 MED ORDER — LIDOCAINE HCL (PF) 1 % IJ SOLN
INTRAMUSCULAR | Status: DC | PRN
  Administered 2015-10-18: 12:00:00

## 2015-10-18 MED ORDER — SODIUM CHLORIDE 0.9 % IJ SOLN: 3.0000 mL | INTRAMUSCULAR | Status: DC | PRN

## 2015-10-18 MED ORDER — FENTANYL CITRATE (PF) 100 MCG/2ML IJ SOLN
INTRAMUSCULAR | Status: AC
  Filled 2015-10-18: qty 2

## 2015-10-18 MED ORDER — ASPIRIN 81 MG PO CHEW: 81.0000 mg | CHEWABLE_TABLET | ORAL | Status: DC

## 2015-10-18 MED ORDER — ONDANSETRON HCL 4 MG/2ML IJ SOLN: 4.0000 mg | Freq: Four times a day (QID) | INTRAMUSCULAR | Status: DC | PRN

## 2015-10-18 MED ORDER — FENTANYL CITRATE (PF) 100 MCG/2ML IJ SOLN
INTRAMUSCULAR | Status: DC | PRN
  Administered 2015-10-18: 25 ug via INTRAVENOUS

## 2015-10-18 MED ORDER — MIDAZOLAM HCL 2 MG/2ML IJ SOLN
INTRAMUSCULAR | Status: DC | PRN
  Administered 2015-10-18: 2 mg via INTRAVENOUS

## 2015-10-18 MED ORDER — IOHEXOL 350 MG/ML SOLN
INTRAVENOUS | Status: DC | PRN
Start: 2015-10-18 — End: 2015-10-18
  Administered 2015-10-18: 80 mL via INTRA_ARTERIAL

## 2015-10-18 MED ORDER — VERAPAMIL HCL 2.5 MG/ML IV SOLN
INTRAVENOUS | Status: AC
  Filled 2015-10-18: qty 2

## 2015-10-18 SURGICAL SUPPLY — 15 items
CATH BALLN WEDGE 5F 110CM (CATHETERS) ×2 IMPLANT
CATH INFINITI 5 FR JL3.5 (CATHETERS) ×2 IMPLANT
CATH INFINITI 5FR ANG PIGTAIL (CATHETERS) ×2 IMPLANT
CATH INFINITI JR4 5F (CATHETERS) ×2 IMPLANT
DEVICE RAD COMP TR BAND LRG (VASCULAR PRODUCTS) ×2 IMPLANT
GLIDESHEATH SLEND SS 6F .021 (SHEATH) ×2 IMPLANT
KIT HEART LEFT (KITS) ×2 IMPLANT
KIT HEART RIGHT NAMIC (KITS) ×2 IMPLANT
PACK CARDIAC CATHETERIZATION (CUSTOM PROCEDURE TRAY) ×2 IMPLANT
SHEATH FAST CATH BRACH 5F 5CM (SHEATH) ×2 IMPLANT
SYR MEDRAD MARK V 150ML (SYRINGE) ×2 IMPLANT
TRANSDUCER W/STOPCOCK (MISCELLANEOUS) ×4 IMPLANT
TUBING CIL FLEX 10 FLL-RA (TUBING) ×2 IMPLANT
WIRE HI TORQ VERSACORE-J 145CM (WIRE) ×2 IMPLANT
WIRE SAFE-T 1.5MM-J .035X260CM (WIRE) ×2 IMPLANT

## 2015-10-18 NOTE — Progress Notes (Signed)
Site area: RT [facility] Site Prior to Removal:  Level O Pressure pAplied For: 30 MINUTES Manual:   YES Patient Status During Pull:  STABLE Post Pull Site:  Level  0. SMALL BRUISE-SIZE OF SMALL ERASER Post Pull Instructions Given:  YES Post Pull Pulses Present: YES Dressing Applied:  SMALL TEGADERM Bedrest begins @  Comments:

## 2015-10-18 NOTE — Discharge Instructions (Signed)
Radial Site Care °Refer to this sheet in the next few weeks. These instructions provide you with information about caring for yourself after your procedure. Your health care provider may also give you more specific instructions. Your treatment has been planned according to current medical practices, but problems sometimes occur. Call your health care provider if you have any problems or questions after your procedure. °WHAT TO EXPECT AFTER THE PROCEDURE °After your procedure, it is typical to have the following: °· Bruising at the radial site that usually fades within 1-2 weeks. °· Blood collecting in the tissue (hematoma) that may be painful to the touch. It should usually decrease in size and tenderness within 1-2 weeks. °HOME CARE INSTRUCTIONS °· Take medicines only as directed by your health care provider. °· You may shower 24-48 hours after the procedure or as directed by your health care provider. Remove the bandage (dressing) and gently wash the site with plain soap and water. Pat the area dry with a clean towel. Do not rub the site, because this may cause bleeding. °· Do not take baths, swim, or use a hot tub until your health care provider approves. °· Check your insertion site every day for redness, swelling, or drainage. °· Do not apply powder or lotion to the site. °· Do not flex or bend the affected arm for 24 hours or as directed by your health care provider. °· Do not push or pull heavy objects with the affected arm for 24 hours or as directed by your health care provider. °· Do not lift over 10 lb (4.5 kg) for 5 days after your procedure or as directed by your health care provider. °· Ask your health care provider when it is okay to: °¨ Return to work or school. °¨ Resume usual physical activities or sports. °¨ Resume sexual activity. °· Do not drive home if you are discharged the same day as the procedure. Have someone else drive you. °· You may drive 24 hours after the procedure unless otherwise  instructed by your health care provider. °· Do not operate machinery or power tools for 24 hours after the procedure. °· If your procedure was done as an outpatient procedure, which means that you went home the same day as your procedure, a responsible adult should be with you for the first 24 hours after you arrive home. °· Keep all follow-up visits as directed by your health care provider. This is important. °SEEK MEDICAL CARE IF: °· You have a fever. °· You have chills. °· You have increased bleeding from the radial site. Hold pressure on the site. °SEEK IMMEDIATE MEDICAL CARE IF: °· You have unusual pain at the radial site. °· You have redness, warmth, or swelling at the radial site. °· You have drainage (other than a small amount of blood on the dressing) from the radial site. °· The radial site is bleeding, and the bleeding does not stop after 30 minutes of holding steady pressure on the site. °· Your arm or hand becomes pale, cool, tingly, or numb. °  °This information is not intended to replace advice given to you by your health care provider. Make sure you discuss any questions you have with your health care provider. °  °Document Released: 12/21/2010 Document Revised: 12/09/2014 Document Reviewed: 06/06/2014 °Elsevier Interactive Patient Education ©2016 Elsevier Inc. ° °

## 2015-10-18 NOTE — Interval H&P Note (Signed)
History and Physical Interval Note:  10/18/2015 11:14 AM  [name] [name]  has presented today for surgery, with the diagnosis of cp  The various methods of treatment have been discussed with the patient and family. After consideration of risks, benefits and other options for treatment, the patient has consented to  Procedure(s): Right/Left Heart Cath and Coronary Angiography (N/A) as a surgical intervention .  The patient's history has been reviewed, patient examined, no change in status, stable for surgery.  I have reviewed the patient's chart and labs.  Questions were answered to the patient's satisfaction.     Sherren Mocha

## 2015-10-18 NOTE — H&P (View-Only) (Signed)
[name] [name] Date of Birth  15-Feb-1941       [facility] Office [address], [address], [place], [place]  [address]   [place],   [address] [phone]     [phone]   Fax  [phone]    Fax [phone]  Problem List: 1. Supraventricular tachycardia 2. Mitral valve prolapse 3. Cataracts  History of Present Illness:  [name] [name] is a  74 yo with hx of SVT and MVP.  She needs to have cataract surgery and is hear for pre op evaluation.  She has done well.  She has not been exercising as much as she would like.  She has not had any syncope or presyncope.  She has been taking care of her granddaughter who has ADD and a sleep disorder.  [place] did not get much sleep that weekend and had lots of palpitations for the following week.  Oct. 7, 2014  [name] [name] is seen after a 1 1/2 year absence.  She has had some fatigue.   Dr. Derrel Nip has found thrombocytopenia.  Crestor was stopped but this did not help the thrombocytopenia.   She has seen a hemotologist.    She fell  (caught her toe at a [employer] - not syncope) several months ago and had reconstruction of her jaw.  She also broke her left wrist.     Oct. 2, 2015 :  [name] [name] is doing well from a cardiac standpoint.  Her cholesterol levels are slightly elevated ( even on Crestor 5 mg a day).  She has had marginally low platelets .  Her cholesterol levels are still minimally elevated.    She had some dyspnea this summer.  Currently , she has no limitations.  She thinks the symptoms may have been due to allergies.  No chest pain.    She has had several falls this year .  She thinks that her stamina is not as good as it should be.  She does yoga but no cardio workouts.    Sept 6, 2016:  [name] [name] is seen today for a recent episode of paroxysmal atrial fib August 21 was shopping , felt very weak .   Could not walk to her car.  Very short of breath .  She went to [place] a day or so  later.  Gradually had worsening chest tightness, allergy, weakness.  Thought it was allergies.  Had significant leg swelling .   She went to the hospital. She was found have rapid atrial fibrillation with evidence of congestive heart failure. Her echocardiogram showed normal left ventricle systolic function. She has mild mitral regurgitation. Was significant calcification of the mitral valve annulus. She was started on Lovenox. She's not received Coumadin at this point.  She was started on diltiazem and Lasix.  Was started on Lovenox ( because of the atrial fib in the setting of MR and itral valve calcification .   She seems to be doing quite a lot at her. She's been going to doctors appointments and has been walking across the parking lot  without any CP with problems.  Sept. 13, 2016:  [name] [name] is seen back for a 1 week visit. She was found to have rapid atrial fib last week Had some dyspnea last night - better with a Xopenex sample inhaler.  Feels better on the Eliquis and Metoprolol .  Feels more relaxed.  Cannot tell if she is in atrial fib Has  low energy .  Difficult to walk to the mailbox without extreme fatigue , not specifically short of breath   Oct. 11, 2016:  Was cardioverted several weeks ago.  Feeling better every day since that   Nov. 10, 2016:  Has done well.  Steady improvement since  the cardioversion.  Then for the past several days/ weeks has not been doing quite a good Has shortness of breath with showering and doing everyday activities.    Current Outpatient Prescriptions on File Prior to Visit  Medication Sig Dispense Refill  . albuterol (PROVENTIL HFA;VENTOLIN HFA) 108 (90 BASE) MCG/ACT inhaler Inhale 2 puffs into the lungs every 6 (six) hours as needed for wheezing or shortness of breath. 1 Inhaler 2  . ALPRAZolam (XANAX) 0.25 MG tablet Take 1 tablet (0.25 mg total) by mouth at bedtime as needed for anxiety or sleep. 30 tablet 5  . apixaban (ELIQUIS) 5 MG TABS  tablet Take 1 tablet (5 mg total) by mouth 2 (two) times daily. 60 tablet 11  . azelastine (ASTELIN) 0.1 % nasal spray Place 1 spray into both nostrils daily.  5  . butalbital-acetaminophen-caffeine (FIORICET, ESGIC) 50-325-40 MG per tablet TAKE 1 TABLET BY MOUTH AS NEEDED FOR BACK PAIN OR HEADACHE 90 tablet 0  . furosemide (LASIX) 40 MG tablet Take 40 mg by mouth daily as needed for fluid or edema.     . metoprolol (LOPRESSOR) 50 MG tablet Take 0.5 tablets (25 mg total) by mouth 2 (two) times daily. 180 tablet 3  . montelukast (SINGULAIR) 10 MG tablet Take 10 mg by mouth at bedtime.      . rosuvastatin (CRESTOR) 5 MG tablet Take 1 tablet (5 mg total) by mouth every other day. 45 tablet 3   No current facility-administered medications on file prior to visit.    Allergies  Allergen Reactions  . Oxycodone-Acetaminophen Other (See Comments)    Other Reaction: migraine  . Codeine Other (See Comments)    migraine  . Doxycycline Itching  . Hydrocodone Nausea And Vomiting    MIGRAINE  . Hydromorphone Nausea And Vomiting  . Meloxicam   . Oxycodone     Past Medical History  Diagnosis Date  . SVT (supraventricular tachycardia) ([place])   . MVP (mitral valve prolapse)   . Hyperlipidemia   . Idiopathic thrombocytopenic purpura (ITP)   . Hemorrhoid   . IBS (irritable bowel syndrome)   . Migraine     Past Surgical History  Procedure Laterality Date  . Tubal ligation    . Foot surgery    . Varicose vein surgery    . Mandible fracture surgery  03-26-13  . Colonoscopy  2003  . Eye surgery Right 2013  . Breast biopsy    . Cardioversion N/A 08/22/2015    Procedure: CARDIOVERSION;  Surgeon: Thayer Headings, MD;  Location: [facility] ENDOSCOPY;  Service: Cardiovascular;  Laterality: N/A;  . Tee without cardioversion N/A 08/22/2015    Procedure: TRANSESOPHAGEAL ECHOCARDIOGRAM (TEE);  Surgeon: Thayer Headings, MD;  Location: [facility];  Service: Cardiovascular;  Laterality: N/A;  . Tee with  cardioversion      History  Smoking status  . Never Smoker   Smokeless tobacco  . Never Used    Comment: Second-hand exposure at her work 2ppd for 3 years    History  Alcohol Use  . 1.8 oz/week  . 3 Standard drinks or equivalent per week    Comment: occasionally    Family History  Problem Relation Age  of Onset  . Hypertension Mother   . Arrhythmia Mother   . Heart failure Mother   . Arrhythmia Brother   . Stroke Brother 61    cerebral hemorrhage, nonsmoker, no HTN  . Stroke Father     from an aneurysm  . Stroke Maternal Aunt 83    cerebral hemorrhage  . Liver cancer Maternal Grandmother   . Atrial fibrillation Son   . Heart attack Neg Hx     Reviw of Systems:  Reviewed in the HPI.  All other systems are negative.  Physical Exam: Blood pressure 102/74, pulse 55, height 5\' 6"  (1.676 m), weight 141 lb 1.9 oz (64.012 kg), SpO2 99 %. General: Well developed, well nourished, in no acute distress. Head: Normocephalic, atraumatic, sclera non-icteric, mucus membranes are moist,  Neck: Supple. Carotids are 2 + without bruits. No JVD Lungs: Clear bilaterally to auscultation. Heart: irreg. Irreg.  normal  S1 S2. She has a mid- late systolic click. There is a 2/6 systolic ejection murmur at the axilla.    Abdomen: Soft, non-tender, non-distended with normal bowel sounds. No hepatomegaly. No rebound/guarding. No masses. Msk:  Strength and tone are normal Extremities: No clubbing or cyanosis. No edema.  Distal pedal pulses are 2+ and equal bilaterally. Neuro: Alert and oriented X 3. Moves all extremities spontaneously. Psych:  Responds to questions appropriately with a normal affect.  ECG:  Assessment / Plan:   1. Persistent Atrial fibrillation with rapid ventricular response: she is maintaining NSR  2.  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  Transthoracic echo suggest a partially flail leaflet.  Has class 3 CHF- symptoms with every day activities,  Has limited her activities based  on that .   She  Has been hospitalized with CHF ( while out in [place]) and has developed atrial fib - requiring cardioversion  I think she needs a  a cardiac cath with the anticipation that she should be referred for MV replacement /repair .    Orders written for R and L heart cath   3. Congestive heart failure: has moderate ( possible moderate - severe) MR Has developed atrial fib Has pulmonary HTN Takes the lasix PRN.  Given her symptoms of CHF and the atrial fib,  I think we need to consider MVR.  She may be a candidate for MV repair  3. Mitral regurgitation:  Has at least moderate MR by TEE . Was thought to be mild by echo in [place], [place].  Our echo here suggests that it is severe. Will proceed with cardiac cath.   Will arrange for her to see Dr. Roxy Manns several days after her cath   Nahser, Wonda Cheng, MD  10/12/2015 8:55 AM    [address] [place],  [place], [place]  [address] Pager [phone] Phone: [phone]; Fax: [phone]   [facility]  [address] [place], [place]  [address] [phone]   Fax [phone]

## 2015-10-19 ENCOUNTER — Encounter: Payer: Self-pay | Admitting: Thoracic Surgery (Cardiothoracic Vascular Surgery)

## 2015-10-19 ENCOUNTER — Encounter (HOSPITAL_COMMUNITY): Payer: Self-pay | Admitting: Cardiovascular Disease

## 2015-10-19 ENCOUNTER — Ambulatory Visit: Payer: Medicare Other | Admitting: Cardiovascular Disease

## 2015-10-19 ENCOUNTER — Institutional Professional Consult (permissible substitution) (INDEPENDENT_AMBULATORY_CARE_PROVIDER_SITE_OTHER): Payer: Medicare Other | Admitting: Thoracic Surgery (Cardiothoracic Vascular Surgery)

## 2015-10-19 ENCOUNTER — Other Ambulatory Visit: Payer: Self-pay | Admitting: *Deleted

## 2015-10-19 VITALS — BP 114/65 | HR 55 | Resp 16 | Ht 66.0 in | Wt 138.0 lb

## 2015-10-19 DIAGNOSIS — I340 Nonrheumatic mitral (valve) insufficiency: Secondary | ICD-10-CM

## 2015-10-19 DIAGNOSIS — I4819 Other persistent atrial fibrillation: Secondary | ICD-10-CM | POA: Insufficient documentation

## 2015-10-19 DIAGNOSIS — I480 Paroxysmal atrial fibrillation: Secondary | ICD-10-CM | POA: Insufficient documentation

## 2015-10-19 DIAGNOSIS — I5033 Acute on chronic diastolic (congestive) heart failure: Secondary | ICD-10-CM | POA: Insufficient documentation

## 2015-10-19 DIAGNOSIS — I719 Aortic aneurysm of unspecified site, without rupture: Secondary | ICD-10-CM

## 2015-10-19 DIAGNOSIS — I481 Persistent atrial fibrillation: Secondary | ICD-10-CM

## 2015-10-19 DIAGNOSIS — I7409 Other arterial embolism and thrombosis of abdominal aorta: Secondary | ICD-10-CM

## 2015-10-19 DIAGNOSIS — I4891 Unspecified atrial fibrillation: Secondary | ICD-10-CM

## 2015-10-19 DIAGNOSIS — I341 Nonrheumatic mitral (valve) prolapse: Secondary | ICD-10-CM

## 2015-10-19 MED ORDER — AMIODARONE HCL 200 MG PO TABS: 200.0000 mg | ORAL_TABLET | Freq: Two times a day (BID) | ORAL | Status: DC

## 2015-10-19 NOTE — Patient Instructions (Signed)
Schedule CT angiogram as soon as practical.  Patient has been instructed to stop taking Eliquis and start taking amiodarone 7 days before your scheduled surgery.  Patient should continue taking all other medications without change through the day before surgery.  Patient should have nothing to eat or drink after midnight the night before surgery.  On the morning of surgery patient should take only metoprolol with a sip of water.

## 2015-10-19 NOTE — Progress Notes (Signed)
[address]       [place] [address]             [phone]     CARDIOTHORACIC SURGERY CONSULTATION REPORT  Referring Provider is Nahser, Wonda Cheng, MD PCP is [name], MD  Chief Complaint  Patient presents with  . Mitral Regurgitation    surgery eval...PFT 09/01/15, ECHO 10/10/15, CATH 10/18/15    HPI:  Patient is a 74 year old female from [place] with long-standing history of mitral valve prolapse, recurrent paroxysmal SVT status post ablation, and recent onset persistent atrial fibrillation associated with acute exacerbation of chronic diastolic congestive heart failure, who has been referred for surgical consultation to discuss the management of severe mitral regurgitation.  The patient states that she has been told that she had mitral valve prolapse and a heart murmur for most of her life. She developed recurrent paroxysmal SVT for which she underwent ablation in 2003 and again in 2007. At the time she lived in [place].  In 2008 she moved to [place] to be close to family and reestablish care locally with Dr. Acie Fredrickson who has been following her intermittently ever since. She remained stable and asymptomatic from a cardiac standpoint until this past summer when the patient began to develop symptoms of exertional shortness of breath. Initially she attributed these symptoms to worsening allergies.  In August she developed an acute episode of shortness of breath, weakness, and chest tightness while she was traveling in [place]. She was hospitalized for several days in [place] where she was diagnosed with atrial fibrillation, mitral regurgitation, and congestive heart failure. She was initially anticoagulated using Lovenox and treated with diltiazem and Lasix. She returned to [facility] and has been followed carefully ever since by Dr. Acie Fredrickson.  She was started on coumadin and underwent TEE guided cardioversion on 08/22/2015.  TEE revealed bileaflet  prolapse with calcified leaflets and moderate-severe mitral regurgitation.  Follow-up transthoracic echocardiogram performed 10/10/2015 Confirmed the presence of severely calcified mitral valve leaflets with restricted leaflet mobility and severe mitral regurgitation. Left ventricular size and systolic function remains normal. There is severe left atrial enlargement. The patient underwent left and right heart catheterization by Dr. Burt Knack on 10/18/2015. Catheterization was notable for the absence of significant coronary artery disease and only mild elevation of pulmonary artery pressures but hemodynamic and angiographic findings consistent with severe mitral regurgitation.  The patient was referred for elective surgical consultation.  The patient is widowed and lives alone in [place].  She has 2 grown children including a son who lives close by who also has mitral valve prolapse with mitral regurgitation and atrial fibrillation. She has remained physically active and functionally independent all of her life. 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  She describes mild symptoms of exertional shortness of breath at present. She denies any resting shortness of breath, PND, orthopnea, dizzy spells, or syncope. She has never had any chest pain or chest tightness. She has had some mild lower extremity edema. She watches her weight carefully and uses Lasix as needed. She has not required Lasix for swelling or increased shortness of breath since she underwent cardioversion. She has had a dry cough in the past that has improved with diuretic therapy.    Past Medical History  Diagnosis Date  . SVT (supraventricular tachycardia) ([place])   . MVP (mitral valve prolapse)   . Hyperlipidemia   . Idiopathic thrombocytopenic purpura (ITP)   . Hemorrhoid   .  IBS (irritable bowel syndrome)   . Migraine   . Severe mitral regurgitation   . RBBB   . Anxiety associated with depression     Prn alprazolam   .  Thoracic aorta atherosclerosis ([place])   . Nodule of right lung   . Restrictive lung disease     Mild on PFT & likely cardiac in etiology   . 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 Atrial fibrillation, persistent ([place])     DCCV 08/22/2015  . Acute on chronic diastolic (congestive) heart failure [facility])     Past Surgical History  Procedure Laterality Date  . Tubal ligation    . Foot surgery    . Varicose vein surgery Right   . Mandible fracture surgery  03-26-13  . Colonoscopy  2003  . Eye surgery Right 2013  . Breast biopsy    . Cardioversion N/A 08/22/2015    Procedure: CARDIOVERSION;  Surgeon: Thayer Headings, MD;  Location: [facility] ENDOSCOPY;  Service: Cardiovascular;  Laterality: N/A;  . Tee without cardioversion N/A 08/22/2015    Procedure: TRANSESOPHAGEAL ECHOCARDIOGRAM (TEE);  Surgeon: Thayer Headings, MD;  Location: [facility] ENDOSCOPY;  Service: Cardiovascular;  Laterality: N/A;  Darden Dates with cardioversion    . Cardiac catheterization N/A 10/18/2015    Procedure: Right/Left Heart Cath and Coronary Angiography;  Surgeon: Sherren Mocha, MD;  Location: [place] CV LAB;  Service: Cardiovascular;  Laterality: N/A;    Family History  Problem Relation Age of Onset  . Hypertension Mother   . Arrhythmia Mother   . Heart failure Mother   . Arrhythmia Brother   . Stroke Brother 44    cerebral hemorrhage, nonsmoker, no HTN  . Stroke Father     from an aneurysm  . Stroke Maternal Aunt 83    cerebral hemorrhage  . Liver cancer Maternal Grandmother   . Atrial fibrillation Son   . Heart attack Neg Hx     Social History   Social History  . Marital Status: Widowed    Spouse Name: Deceased  . Number of Children: N/A  . Years of Education: 16   Occupational History  . Not on file.   Social History Main Topics  . Smoking status: Never Smoker   . Smokeless tobacco: Never Used     Comment: Second-hand exposure at her work 2ppd for 3 years  . Alcohol Use: 1.8 oz/week    3 Standard drinks or  equivalent per week     Comment: occasionally  . Drug Use: No  . Sexual Activity: No   Other Topics Concern  . Not on file   Social History Narrative   She is a widow.  Husband died from metastatic renal cell cancer. Originally from [place]. Previously lived in [place] from 1975-[place]. Moved to [place] in 2007. No mold exposure recently but did have it through a prior work exposure in 1993. Has a masters in public health. No bird exposure.    Current Outpatient Prescriptions  Medication Sig Dispense Refill  . albuterol (PROVENTIL HFA;VENTOLIN HFA) 108 (90 BASE) MCG/ACT inhaler Inhale 2 puffs into the lungs every 6 (six) hours as needed for wheezing or shortness of breath. 1 Inhaler 2  . ALPRAZolam (XANAX) 0.25 MG tablet Take 1 tablet (0.25 mg total) by mouth at bedtime as needed for anxiety or sleep. 30 tablet 5  . apixaban (ELIQUIS) 5 MG TABS tablet Take 1 tablet (5 mg total) by mouth 2 (two) times daily. 60 tablet 11  . azelastine (  ASTELIN) 0.1 % nasal spray Place 1 spray into both nostrils daily.  5  . butalbital-acetaminophen-caffeine (FIORICET, ESGIC) 50-325-40 MG per tablet TAKE 1 TABLET BY MOUTH AS NEEDED FOR BACK PAIN OR HEADACHE 90 tablet 0  . furosemide (LASIX) 40 MG tablet Take 40 mg by mouth daily as needed for fluid or edema. Only if gain 2 lbs or more    . metoprolol (LOPRESSOR) 50 MG tablet Take 0.5 tablets (25 mg total) by mouth 2 (two) times daily. 180 tablet 3  . montelukast (SINGULAIR) 10 MG tablet Take 10 mg by mouth at bedtime.      . rosuvastatin (CRESTOR) 5 MG tablet Take 1 tablet (5 mg total) by mouth every other day. 45 tablet 3   No current facility-administered medications for this visit.    Allergies  Allergen Reactions  . Oxycodone-Acetaminophen Other (See Comments)    Other Reaction: migraine  . Codeine Other (See Comments)    migraine  . Doxycycline Itching  . Hydrocodone Nausea And Vomiting    MIGRAINE  . Hydromorphone Nausea And Vomiting  . Meloxicam       Severe reflux  . Oxycodone Other (See Comments)    Severe migraine      Review of Systems:   General:  normal appetite, normal energy, no weight gain, no weight loss, no fever  Cardiac:  no chest pain with exertion, no chest pain at rest, + SOB with exertion, no resting SOB, no PND, no orthopnea, + palpitations, + arrhythmia, + atrial fibrillation, + LE edema, no dizzy spells, no syncope  Respiratory:  + shortness of breath, no home oxygen, no productive cough, + dry cough, no bronchitis, no wheezing, no hemoptysis, no asthma, no pain with inspiration or cough, no sleep apnea, no CPAP at night  GI:   no difficulty swallowing, no reflux, no frequent heartburn, no hiatal hernia, no abdominal pain, no constipation, no diarrhea, no hematochezia, no hematemesis, no melena  GU:   no dysuria,  no frequency, no urinary tract infection, no hematuria, no kidney stones, no kidney disease  Vascular:  no pain suggestive of claudication, no pain in feet, no leg cramps, no varicose veins, no DVT, no non-healing foot ulcer  Neuro:   no stroke, no TIA's, no seizures, + occasional migraine headaches, no temporary blindness one eye,  no slurred speech, no peripheral neuropathy, no chronic pain, no instability of gait, no memory/cognitive dysfunction  Musculoskeletal: + arthritis in lower back, no joint swelling, no myalgias, no difficulty walking, normal mobility   Skin:   no rash, no itching, no skin infections, no pressure sores or ulcerations  Psych:   no anxiety, no depression, no nervousness, no unusual recent stress  Eyes:   no blurry vision, + floaters, no recent vision changes, + wears glasses or contacts  ENT:   no hearing loss, no loose or painful teeth, no dentures, last saw dentist 09/01/2015  Hematologic:  + easy bruising, no abnormal bleeding, no clotting disorder, no frequent epistaxis  Endocrine:  no diabetes, does not check CBG's at home     Physical Exam:   BP 114/65 mmHg  Pulse 55   Resp 16  Ht 5\' 6"  (1.676 m)  Wt 138 lb (62.596 kg)  BMI 22.28 kg/m2  LMP  (Exact Date)  General:    well-appearing  HEENT:  Unremarkable   Neck:   no JVD, no bruits, no adenopathy   Chest:   clear to auscultation, symmetrical breath sounds, no wheezes, no  rhonchi   CV:   Irregular rhythm, grade IV/VI harsh systolic murmur   Abdomen:  soft, non-tender, no masses   Extremities:  warm, well-perfused, pulses palpable, no LE edema  Rectal/GU  Deferred  Neuro:   Grossly non-focal and symmetrical throughout  Skin:   Clean and dry, no rashes, no breakdown   Diagnostic Tests:  Transesophageal Echocardiography  (Report amended )  Patient:  Fortuna, Delille MR #:    [id number] Study Date: 08/22/2015 Gender:   F Age:    82 Height:   167.6 cm Weight:   62.3 kg BSA:    1.7 m^2 Pt. Status: Room:  ATTENDING  Mertie Moores, M.D. PERFORMING  Mertie Moores, M.D. REFERRING  Mertie Moores, M.D. SONOGRAPHER Tresa Res, RDCS ADMITTING  Nahser, Peggyann Juba   Nahser, Jr  cc:  -------------------------------------------------------------------  ------------------------------------------------------------------- Indications:   Atrial fibrillation - 427.31.  ------------------------------------------------------------------- Study Conclusions  - Left ventricle: The cavity size was normal. Wall thickness was normal. Systolic function was normal. - Aortic valve: No evidence of vegetation. - Mitral valve: Moderate prolapse, involving the anterior leaflet and the posterior leaflet. There was moderate to severe regurgitation. - Left atrium: No evidence of thrombus in the atrial cavity or appendage.  Impressions:  - The patient is very sensitive to propofol. She developed marked hypotension requiring neosynephrine during the procedure and became apneic several times.  She was suggessfully cardioverted after the TEE  Diagnostic  transesophageal echocardiography. 2D and color Doppler. Birthdate: Patient birthdate: [birth year]. Age: Patient is 74 yr old. Sex: Gender: female.  BMI: 22.2 kg/m^2. Blood pressure: 118/78 Patient status: Outpatient. Study date: Study date: 08/22/2015. Study time: 01:08 PM. Location: Endoscopy.  -------------------------------------------------------------------  ------------------------------------------------------------------- Left ventricle: The cavity size was normal. Wall thickness was normal. Systolic function was normal.  ------------------------------------------------------------------- Aortic valve:  Structurally normal valve.  Cusp separation was normal. No evidence of vegetation. Doppler: There was no regurgitation.  ------------------------------------------------------------------- Aorta: The aorta was not dilated, mildly calcified, and non-diseased.  ------------------------------------------------------------------- Mitral valve:  Moderate prolapse, involving the anterior leaflet and the posterior leaflet. Doppler: There was moderate to severe regurgitation.  ------------------------------------------------------------------- Left atrium:  No evidence of thrombus in the atrial cavity or appendage.  ------------------------------------------------------------------- Tricuspid valve:  Doppler: There was trivial regurgitation.  ------------------------------------------------------------------- Post procedure conclusions Ascending Aorta:  - The aorta was not dilated, mildly calcified, and non-diseased.  ------------------------------------------------------------------- Jonne Ply, M.D. 2016-10-11T13:49:57   Transthoracic Echocardiography  Patient:  Malaiya, Foister MR #:    [id number] Study Date: 10/10/2015 Gender:   F Age:    36 Height:   167.6 cm Weight:   61.2 kg BSA:    1.69 m^2 Pt.  Status: Room:  REFERRING  Mertie Moores, M.D. SONOGRAPHER Wyatt Mage, [place], Outpatient ATTENDING  Skeet Latch, MD Rex Kras, Jr  cc:  -------------------------------------------------------------------  ------------------------------------------------------------------- Indications:   Mitral valve prolpase (I34.1).  ------------------------------------------------------------------- History:  PMH:  Mitral valve prolapse. Mitral stenosis. Risk factors: SVT. RBBB. Pulmonary hypertension. Osteoporosis. Palpitation. Thrombocytopenia. Lumbago. Dyslipidemia.  ------------------------------------------------------------------- Study Conclusions  - Left ventricle: The cavity size was normal. Systolic function was normal. Wall motion was normal; there were no regional wall motion abnormalities. - Mitral valve: Calcified annulus. Severely thickened, severely calcified leaflets . Thickening. Mobility was restricted. Prolapse, involving the anterior leaflet and the posterior leaflet. The findings are consistent with moderate to severe stenosis. There was severe regurgitation. Valve area by pressure half-time: 1.49 cm^2. Valve area by continuity equation (using LVOT  flow): 0.95 cm^2. Ability to accurately assess the severity of MS due to atrial fibrillation. - Left atrium: The atrium was severely dilated. - Right atrium: The atrium was mildly dilated. - Atrial septum: No defect or patent foramen ovale was identified. - Pulmonic valve: There was moderate regurgitation. - Pulmonary arteries: PA peak pressure: 44 mm Hg (S). - Pericardium, extracardiac: A trivial pericardial effusion was identified.  ------------------------------------------------------------------- Labs, prior tests, procedures, and surgery: Transesophageal echocardiography (08/23/2015).  Transthoracic echocardiography. M-mode, complete 2D,  spectral Doppler, and color Doppler. Birthdate: Patient birthdate: 1941/11/09. Age: Patient is 74 yr old. Sex: Gender: female. BMI: 21.8 kg/m^2. Blood pressure:   102/58 Patient status: Outpatient. Study date: Study date: 10/10/2015. Study time: 09:43 AM. Location: [facility]  -------------------------------------------------------------------  ------------------------------------------------------------------- Left ventricle: The cavity size was normal. Systolic function was normal. Wall motion was normal; there were no regional wall motion abnormalities.  ------------------------------------------------------------------- Aortic valve:  Trileaflet; normal thickness leaflets. Mobility was not restricted. Doppler: Transvalvular velocity was within the normal range. There was no stenosis. There was no regurgitation.  ------------------------------------------------------------------- Aorta: Aortic root: The aortic root was normal in size.  ------------------------------------------------------------------- Mitral valve:  Calcified annulus. Severely thickened, severely calcified leaflets . Thickening. Mobility was restricted. Prolapse, involving the anterior leaflet and the posterior leaflet. Ability to accurately assess the severity of MS due to atrial fibrillation. Doppler:  The findings are consistent with moderate to severe stenosis.  There was severe regurgitation.  Valve area by pressure half-time: 1.49 cm^2. Indexed valve area by pressure half-time: 0.88 cm^2/m^2. Valve area by continuity equation (using LVOT flow): 0.95 cm^2. Indexed valve area by continuity equation (using LVOT flow): 0.56 cm^2/m^2.  Mean gradient (D): 5 mm Hg.  ------------------------------------------------------------------- Left atrium: The atrium was severely dilated.  ------------------------------------------------------------------- Atrial septum: No defect  or patent foramen ovale was identified.  ------------------------------------------------------------------- Right ventricle: The cavity size was normal. Wall thickness was normal. Systolic function was normal.  ------------------------------------------------------------------- Pulmonic valve:  The valve appears to be grossly normal. Doppler: Transvalvular velocity was within the normal range. There was no evidence for stenosis. There was moderate regurgitation.  ------------------------------------------------------------------- Tricuspid valve:  Structurally normal valve.  Doppler: Transvalvular velocity was within the normal range. There was mild regurgitation.  ------------------------------------------------------------------- Pulmonary artery:  The main pulmonary artery was normal-sized. Systolic pressure was within the normal range.  ------------------------------------------------------------------- Right atrium: The atrium was mildly dilated.  ------------------------------------------------------------------- Pericardium: A trivial pericardial effusion was identified.  ------------------------------------------------------------------- Systemic veins: Inferior vena cava: The vessel was dilated. The respirophasic diameter changes were blunted (< 50%), consistent with elevated central venous pressure.  ------------------------------------------------------------------- Measurements  Left ventricle              Value     Reference LV ID, ED, PLAX chordal          49.2 mm    43 - 52 LV ID, ES, PLAX chordal          26  mm    23 - 38 LV fx shortening, PLAX chordal      47  %    >=29 LV PW thickness, ED            8.38 mm    --------- IVS/LV PW ratio, ED            1.04      <=1.3 Stroke volume, 2D             51  ml    --------- Stroke  volume/bsa,  2D           30  ml/m^2  ---------  Ventricular septum            Value     Reference IVS thickness, ED             8.71 mm    ---------  LVOT                   Value     Reference LVOT ID, S                20  mm    --------- LVOT area                 3.14 cm^2   --------- LVOT peak velocity, S           101  cm/s   --------- LVOT mean velocity, S           54.4 cm/s   --------- LVOT VTI, S                16.1 cm    --------- LVOT peak gradient, S           4   mm Hg  ---------  Aorta                   Value     Reference Aortic root ID, ED            34  mm    ---------  Left atrium                Value     Reference LA ID, A-P, ES              49  mm    --------- LA ID/bsa, A-P          (H)   2.9  cm/m^2  <=2.2 LA volume, S               131  ml    --------- LA volume/bsa, S             77.5 ml/m^2  --------- LA volume, ES, 1-p A4C          126  ml    --------- LA volume/bsa, ES, 1-p A4C        74.6 ml/m^2  --------- LA volume, ES, 1-p A2C          130  ml    --------- LA volume/bsa, ES, 1-p A2C        76.9 ml/m^2  ---------  Mitral valve               Value     Reference Mitral mean velocity, D          103  cm/s   --------- Mitral pressure half-time         149  ms    --------- Mitral mean gradient, D          5   mm Hg  --------- Mitral valve area, PHT, DP        1.49 cm^2   --------- Mitral valve area/bsa, PHT, DP      0.88 cm^2/m^2 --------- Mitral valve area, LVOT          0.95 cm^2    --------- continuity Mitral valve area/bsa, LVOT        0.56 cm^2/m^2 --------- continuity Mitral annulus VTI, D  53.3 cm    ---------  Pulmonary arteries            Value     Reference PA pressure, S, DP        (H)   44  mm Hg  <=30  Tricuspid valve              Value     Reference Tricuspid regurg peak velocity      300  cm/s   --------- Tricuspid peak RV-RA gradient       36  mm Hg  ---------  Systemic veins              Value     Reference Estimated CVP               8   mm Hg  ---------  Right ventricle              Value     Reference RV pressure, S, DP        (H)   44  mm Hg  <=30  Legend: (L) and (H) mark values outside specified reference range.  ------------------------------------------------------------------- Prepared and Electronically Authenticated by  Skeet Latch, MD 2016-11-08T14:18:07   CARDIAC CATHETERIZATION  Procedures    Right/Left Heart Cath and Coronary Angiography    Conclusion    1. Patent coronary arteries without significant obstruction 2. Severe mitral regurgitation by hemodynamic and angiographic assessment 3. There is a small diastolic gradient on simultaneous LV/PCWP measurement. There are large V waves present. Valve area calculation is not calculated.  Plan: upcoming evaluation with Dr Roxy Manns for MV repair/replacement +/- Maze       Indications    Severe mitral regurgitation [I34.0 (ICD-10-CM)]    Technique and Indications    INDICATION: Severe, symptomatic mitral valve regurgitation.  PROCEDURAL DETAILS: There was an indwelling IV in a right antecubital vein. Using normal sterile technique, the IV was changed out for a 5 Fr brachial sheath over a 0.018 inch wire. The right wrist was then prepped, draped, and anesthetized with 1%  lidocaine. Using the modified Seldinger technique a 5/6 French Slender sheath was placed in the right radial artery. Intra-arterial verapamil was administered through the radial artery sheath. IV heparin was administered after a JR4 catheter was advanced into the central aorta. A Swan-Ganz catheter was used for the right heart catheterization. Standard protocol was followed for recording of right heart pressures and sampling of oxygen saturations. Fick cardiac output was calculated. Standard Judkins catheters were used for selective coronary angiography and left ventriculography. There were no immediate procedural complications. The patient was transferred to the post catheterization recovery area for further monitoring.   Estimated blood loss <50 mL. There were no immediate complications during the procedure.    Coronary Findings    Dominance: Left   Left Anterior Descending  Large vessel, wraps around the LV apex. No significant obstruction.     Left Circumflex  Large, codominant circumflex with no significant obstruction     Right Coronary Artery  Medium caliber RCA with a large Acute Marginal branch and small PDA branch. No significant stenosis.       Right Heart Pressures Hemodynamic findings consistent with mitral valve regurgitation. LV EDP is normal.    Wall Motion                 Left Heart    Left Ventricle The left ventricular size is normal. There is hyperdynamic left ventricular systolic function. The  left ventricular ejection fraction is greater tha 65% by visual estimate.   Mitral Valve There is mild mitral valve stenosis, severe (4+) mitral regurgitation and mitral valve prolapse present. There is severe MR by angiographic criteria. There is mitral valve prolapse present. The mitral valve apparatus appears calcified without severe annular calcium.    Coronary Diagrams    Diagnostic Diagram            Implants    Name ID Temporary Type Supply    No information to display    PACS Images    Show images for Cardiac catheterization     Link to Procedure Log    Procedure Log      Hemo Data       Most Recent Value   RA A Wave  8 mmHg   RA V Wave  7 mmHg   RA Mean  4 mmHg   RV Systolic Pressure  37 mmHg   RV Diastolic Pressure  1 mmHg   RV EDP  8 mmHg   PA Systolic Pressure  37 mmHg   PA Diastolic Pressure  10 mmHg   PA Mean  20 mmHg   PW A Wave  24 mmHg   PW V Wave  27 mmHg   PW Mean  16 mmHg   AO Systolic Pressure  28 mmHg   AO Diastolic Pressure  11 mmHg   AO Mean  17 mmHg   LV Systolic Pressure  99 mmHg   LV Diastolic Pressure  2 mmHg   LV EDP  15 mmHg   Arterial Occlusion Pressure Extended Systolic Pressure  [id number] mmHg   Arterial Occlusion Pressure Extended Diastolic Pressure  51 mmHg   Arterial Occlusion Pressure Extended Mean Pressure  71 mmHg   Left Ventricular Apex Extended Systolic Pressure  123456 mmHg   Left Ventricular Apex Extended Diastolic Pressure  3 mmHg   Left Ventricular Apex Extended EDP Pressure  11 mmHg     Impression:  Patient has stage D severe symptomatic primary mitral regurgitation with history of recent onset persistent atrial fibrillation. She presents with symptoms of chronic diastolic congestive heart failure that became acutely worse this past August, presumably at the time the patient initially developed persistent atrial fibrillation.  She has been doing much better recently since she underwent DC cardioversion. I have personally reviewed the patient's recent transthoracic and transesophageal echocardiograms and diagnostic cardiac catheterization. 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 There is severe mitral regurgitation. Left ventricular systolic function is preserved. The patient does not have  significant coronary artery disease and pulmonary artery pressures are only mildly elevated. I agree the patient needs mitral valve repair or replacement and would likely benefit from concomitant Maze procedure. Although there remains a possibility her mitral valve could be repaired, the degree of extensive calcification significantly increases the likelihood that mitral valve replacement will be required.  She may be a good candidate for minimally invasive approach for surgery.   Plan:  The patient and her son were counseled at length regarding the indications, risks and potential benefits of mitral valve repair.  The rationale for elective surgery has been explained, including a comparison between surgery and continued medical therapy with close follow-up.  The likelihood of successful and durable valve repair has been discussed with particular reference to the findings of their recent echocardiogram.  Based upon  these findings and previous experience, I have quoted them a less than 50 percent likelihood of successful valve repair.  In the unlikely event that their valve cannot be successfully repaired, we discussed the possibility of replacing the mitral valve using a mechanical prosthesis with the attendant need for long-term anticoagulation versus the alternative of replacing it using a bioprosthetic tissue valve with its potential for late structural valve deterioration and failure, depending upon the patient's longevity.  The patient specifically requests that if the mitral valve must be replaced that it be done using a bioprosthetic tissue valve.   The patient understands and accepts all potential risks of surgery including but not limited to risk of death, stroke or other neurologic complication, myocardial infarction, congestive heart failure, respiratory failure, renal failure, bleeding requiring transfusion and/or reexploration, arrhythmia, infection or other wound complications, pneumonia, pleural  and/or pericardial effusion, pulmonary embolus, aortic dissection or other major vascular complication, or delayed complications related to valve repair or replacement including but not limited to structural valve deterioration and failure, thrombosis, embolization, endocarditis, or paravalvular leak.  Alternative surgical approaches have been discussed including a comparison between conventional sternotomy and minimally-invasive techniques.  The relative risks and benefits of each have been reviewed as they pertain to the patient's specific circumstances, and all of their questions have been addressed.  Specific risks potentially related to the minimally-invasive approach were discussed at length, including but not limited to risk of conversion to full or partial sternotomy, aortic dissection or other major vascular complication, unilateral acute lung injury or pulmonary edema, phrenic nerve dysfunction or paralysis, rib fracture, chronic pain, lung hernia, or lymphocele. All of their questions have been answered.  We tentatively plan to proceed with surgery on Tuesday, 10/31/2015. The patient will undergo CT angiography to further characterize the feasibility of peripheral arterial cannulation for surgery. The patient will return for follow-up on Monday, 10/30/2015. She has been given a prescription for amiodarone to begin 7 days prior to surgery. She will stop taking Eliquis when she begins amiodarone.  We will make arrangements for Lovenox bridging.   I spent in excess of 90 minutes during the conduct of this office consultation and >50% of this time involved direct face-to-face encounter with the patient for counseling and/or coordination of their care.    Valentina Gu. Roxy Manns, MD 10/19/2015 4:10 PM

## 2015-10-20 ENCOUNTER — Encounter: Payer: Self-pay | Admitting: Pulmonary Disease

## 2015-10-23 NOTE — Telephone Encounter (Signed)
Below is an email sent by pt: ------------------------------------------- Dr. Milinda Hirschfeld,  I am scheduled to have the mitral valve repaired or replaced on 10/31/15 by Dr. Darylene Price. For pre-Op I have to have a CT scan with contrast on 10/27/15. That is scheduled and I will be getting it.  It has been suggested to me by Dr. Guy Sandifer staff that you could use that scan of 10/27/15 instead of requiring me to have another scan on 11/29/15 which insurance may not pay for and which may be too soon for my post surgery condition.  Please advise whether I should cancel the 11/29/15 scan that you had requested.  Thank you,  [name] [name]  -------------------------------------  Pt is scheduled for a CT angio chest/aorta on 10/27/15.  Please advise if you're ok with pt cancelling CT chest appt on 11/29/15.  Thanks!

## 2015-10-25 ENCOUNTER — Other Ambulatory Visit: Payer: Self-pay | Admitting: *Deleted

## 2015-10-25 DIAGNOSIS — I340 Nonrheumatic mitral (valve) insufficiency: Secondary | ICD-10-CM

## 2015-10-27 ENCOUNTER — Encounter (HOSPITAL_COMMUNITY)
Admission: RE | Admit: 2015-10-27 | Discharge: 2015-10-27 | Disposition: A | Discharge status: Home / Self Care | Payer: Medicare Other | Source: Ambulatory Visit | Attending: Thoracic Surgery (Cardiothoracic Vascular Surgery) | Admitting: Thoracic Surgery (Cardiothoracic Vascular Surgery)

## 2015-10-27 ENCOUNTER — Ambulatory Visit
Admission: RE | Admit: 2015-10-27 | Discharge: 2015-10-27 | Disposition: A | Discharge status: Home / Self Care | Payer: Medicare Other | Source: Ambulatory Visit | Attending: Thoracic Surgery (Cardiothoracic Vascular Surgery) | Admitting: Thoracic Surgery (Cardiothoracic Vascular Surgery)

## 2015-10-27 ENCOUNTER — Encounter (HOSPITAL_COMMUNITY): Payer: Self-pay

## 2015-10-27 ENCOUNTER — Ambulatory Visit (HOSPITAL_COMMUNITY)
Admission: RE | Admit: 2015-10-27 | Discharge: 2015-10-27 | Disposition: A | Discharge status: Home / Self Care | Payer: Medicare Other | Source: Ambulatory Visit | Attending: Thoracic Surgery (Cardiothoracic Vascular Surgery) | Admitting: Thoracic Surgery (Cardiothoracic Vascular Surgery)

## 2015-10-27 VITALS — BP 105/56 | HR 52 | Temp 97.4°F | Resp 18 | Ht 66.0 in | Wt 143.3 lb

## 2015-10-27 DIAGNOSIS — I340 Nonrheumatic mitral (valve) insufficiency: Secondary | ICD-10-CM

## 2015-10-27 DIAGNOSIS — I6523 Occlusion and stenosis of bilateral carotid arteries: Secondary | ICD-10-CM | POA: Diagnosis not present

## 2015-10-27 DIAGNOSIS — R918 Other nonspecific abnormal finding of lung field: Secondary | ICD-10-CM | POA: Diagnosis not present

## 2015-10-27 DIAGNOSIS — D696 Thrombocytopenia, unspecified: Secondary | ICD-10-CM | POA: Diagnosis not present

## 2015-10-27 DIAGNOSIS — I700 Atherosclerosis of aorta: Secondary | ICD-10-CM | POA: Insufficient documentation

## 2015-10-27 DIAGNOSIS — Z01810 Encounter for preprocedural cardiovascular examination: Secondary | ICD-10-CM | POA: Diagnosis not present

## 2015-10-27 DIAGNOSIS — I4891 Unspecified atrial fibrillation: Secondary | ICD-10-CM

## 2015-10-27 DIAGNOSIS — R002 Palpitations: Secondary | ICD-10-CM | POA: Insufficient documentation

## 2015-10-27 DIAGNOSIS — I341 Nonrheumatic mitral (valve) prolapse: Secondary | ICD-10-CM | POA: Insufficient documentation

## 2015-10-27 DIAGNOSIS — I517 Cardiomegaly: Secondary | ICD-10-CM | POA: Insufficient documentation

## 2015-10-27 DIAGNOSIS — I2510 Atherosclerotic heart disease of native coronary artery without angina pectoris: Secondary | ICD-10-CM | POA: Diagnosis not present

## 2015-10-27 DIAGNOSIS — I719 Aortic aneurysm of unspecified site, without rupture: Secondary | ICD-10-CM | POA: Diagnosis not present

## 2015-10-27 DIAGNOSIS — I4510 Unspecified right bundle-branch block: Secondary | ICD-10-CM | POA: Insufficient documentation

## 2015-10-27 DIAGNOSIS — E785 Hyperlipidemia, unspecified: Secondary | ICD-10-CM | POA: Insufficient documentation

## 2015-10-27 DIAGNOSIS — M810 Age-related osteoporosis without current pathological fracture: Secondary | ICD-10-CM | POA: Diagnosis not present

## 2015-10-27 DIAGNOSIS — Z01818 Encounter for other preprocedural examination: Secondary | ICD-10-CM | POA: Diagnosis not present

## 2015-10-27 DIAGNOSIS — I7409 Other arterial embolism and thrombosis of abdominal aorta: Secondary | ICD-10-CM | POA: Diagnosis present

## 2015-10-27 LAB — COMPREHENSIVE METABOLIC PANEL
ALT: 30 U/L (ref 14–54)
AST: 30 U/L (ref 15–41)
Albumin: 3.7 g/dL (ref 3.5–5.0)
Alkaline Phosphatase: 63 U/L (ref 38–126)
Anion gap: 6 (ref 5–15)
BUN: 19 mg/dL (ref 6–20)
CO2: 22 mmol/L (ref 22–32)
Calcium: 8.5 mg/dL — ABNORMAL LOW (ref 8.9–10.3)
Chloride: 103 mmol/L (ref 101–111)
Creatinine, Ser: 0.91 mg/dL (ref 0.44–1.00)
GFR calc Af Amer: >60 mL/min (ref >60)
GFR calc non Af Amer: >60 mL/min (ref >60)
Glucose, Bld: 112 mg/dL — ABNORMAL HIGH (ref 65–99)
Potassium: 4.3 mmol/L (ref 3.5–5.1)
Sodium: 131 mmol/L — ABNORMAL LOW (ref 135–145)
Total Bilirubin: 0.7 mg/dL (ref 0.3–1.2)
Total Protein: 5.8 g/dL — ABNORMAL LOW (ref 6.5–8.1)

## 2015-10-27 LAB — CBC
HCT: 37.6 % (ref 36.0–46.0)
Hemoglobin: 12.2 g/dL (ref 12.0–15.0)
MCH: 29.4 pg (ref 26.0–34.0)
MCHC: 32.4 g/dL (ref 30.0–36.0)
MCV: 90.6 fL (ref 78.0–100.0)
Platelets: 124 10*3/uL — ABNORMAL LOW (ref 150–400)
RBC: 4.15 MIL/uL (ref 3.87–5.11)
RDW: 14.1 % (ref 11.5–15.5)
WBC: 5.4 10*3/uL (ref 4.0–10.5)

## 2015-10-27 LAB — ABO/RH: ABO/RH(D): A POS

## 2015-10-27 LAB — SURGICAL PCR SCREEN
MRSA, PCR: NEGATIVE
Staphylococcus aureus: NEGATIVE

## 2015-10-27 LAB — TYPE AND SCREEN
ABO/RH(D): A POS
Antibody Screen: NEGATIVE

## 2015-10-27 LAB — URINALYSIS, ROUTINE W REFLEX MICROSCOPIC
Bilirubin Urine: NEGATIVE
Glucose, UA: NEGATIVE mg/dL
Hgb urine dipstick: NEGATIVE
Ketones, ur: NEGATIVE mg/dL
Leukocytes, UA: NEGATIVE
Nitrite: NEGATIVE
Protein, ur: NEGATIVE mg/dL
Specific Gravity, Urine: <1.005 — ABNORMAL LOW (ref 1.005–1.030)
pH: 5 (ref 5.0–8.0)

## 2015-10-27 LAB — PROTIME-INR
INR: 1.08 (ref 0.00–1.49)
Prothrombin Time: 14.2 seconds (ref 11.6–15.2)

## 2015-10-27 LAB — APTT: aPTT: 30 seconds (ref 24–37)

## 2015-10-27 MED ORDER — CHLORHEXIDINE GLUCONATE 4 % EX LIQD: 30.0000 mL | CUTANEOUS | Status: DC

## 2015-10-27 MED ORDER — IOHEXOL 350 MG/ML SOLN
80.0000 mL | Freq: Once | INTRAVENOUS | Status: AC | PRN
  Administered 2015-10-27: 75 mL via INTRAVENOUS

## 2015-10-27 NOTE — Progress Notes (Addendum)
VASCULAR LAB PRELIMINARY  PRELIMINARY  PRELIMINARY  PRELIMINARY  Pre-op Cardiac Surgery  Carotid Findings:  1-39% bilateral carotid stenosis.  Upper Extremity Right Left  Brachial Pressures 100 106  Radial Waveforms Mildly abnormal Mildly abnormal  Ulnar Waveforms Mildly abnormal  Mildly abnormal  Palmar Arch (Allen's Test) WNL WNL   Mildly abnormal Doppler waveforms may be secondary to cardiac condition.   Alla German, RVT 10/27/2015, 11:37 AM

## 2015-10-27 NOTE — Progress Notes (Signed)
Cardiologist is Dr.Nishan with last visit in epic from 10-12-15  Medical Md is Dr.Tullo  EKG in epic from 10-18-15  Echo and heart cath in epic from 2016

## 2015-10-27 NOTE — Pre-Procedure Instructions (Signed)
[name] [name]  10/27/2015      [facility] - [address] [place], [place] [address] Phone: [phone] Fax: [phone]  [facility] [address] - [place], [place] - [id number] [place] [address] [place] [address] [place] [address] Phone: [phone] Fax: [phone]  [facility] [place], [place] [address] [place] [address] Phone: [phone] Fax: [place], [place] [address] [place] [phone] [address] [place] [address] Phone: [phone] Fax: [phone]    Your procedure is scheduled on Tues, Nov 29 @ 7:30 AM  Report to [facility] Admitting at 5:30 AM  Call this number if you have problems the morning of surgery:  [phone]   Remember:  Do not eat food or drink liquids after midnight.  Take these medicines the morning of surgery with A SIP OF WATER Albuterol<Bring Your Inhaler With You>,Amiodarone(Pacerone),Astelin Nasal Spray,and Metoprolol(Lopressor)              No  Goody's,BC's,Aleve,Aspirin,Ibuprofen,Motrin,Advil,Fish Oil,or any Herbal Medications.    Do not wear jewelry, make-up or nail polish.  Do not wear lotions, powders, or perfumes.   Do not shave 48 hours prior to surgery.    Do not bring valuables to the hospital.  [facility] is not responsible for any belongings or valuables.  Contacts, dentures or bridgework may not be worn into surgery.  Leave your suitcase in the car.  After surgery it may be brought to your room.  For patients admitted to the hospital, discharge time will be determined by your treatment team.  Patients discharged the day of surgery will not be allowed to drive home.    Special instructions:  [facility] - Preparing for Surgery  Before surgery, you can play an important role.   Because skin is not sterile, your skin needs to be as free of germs as possible.  You can reduce the number of germs on you skin by washing with CHG (chlorahexidine gluconate) soap before surgery.  CHG is an antiseptic cleaner which kills germs and bonds with the skin to continue killing germs even after washing.  Please DO NOT use if you have an allergy to CHG or antibacterial soaps.  If your skin becomes reddened/irritated stop using the CHG and inform your nurse when you arrive at Short Stay.  Do not shave (including legs and underarms) for at least 48 hours prior to the first CHG shower.  You may shave your face.  Please follow these instructions carefully:   1.  Shower with CHG Soap the night before surgery and the                                morning of Surgery.  2.  If you choose to wash your hair, wash your hair first as usual with your       normal shampoo.  3.  After you shampoo, rinse your hair and body thoroughly to remove the                      Shampoo.  4.  Use CHG as you would any other liquid soap.  You can  apply chg directly       to the skin and wash gently with scrungie or a clean washcloth.  5.  Apply the CHG Soap to your body ONLY FROM THE NECK DOWN.        Do not use on open wounds or open sores.  Avoid contact with your eyes,       ears, mouth and genitals (private parts).  Wash genitals (private parts)       with your normal soap.  6.  Wash thoroughly, paying special attention to the area where your surgery        will be performed.  7.  Thoroughly rinse your body with warm water from the neck down.  8.  DO NOT shower/wash with your normal soap after using and rinsing off       the CHG Soap.  9.  Pat yourself dry with a clean towel.            10.  Wear clean pajamas.            11.  Place clean sheets on your bed the night of your first shower and do not        sleep with pets.  Day of Surgery  Do not apply any lotions/deoderants the morning of surgery.  Please wear  clean clothes to the hospital/surgery center.    Please read over the following fact sheets that you were given. Pain Booklet, Coughing and Deep Breathing, Blood Transfusion Information, MRSA Information and Surgical Site Infection Prevention

## 2015-10-28 LAB — HEMOGLOBIN A1C
Hgb A1c MFr Bld: 5.7 % — ABNORMAL HIGH (ref 4.8–5.6)
Mean Plasma Glucose: 117 mg/dL

## 2015-10-30 ENCOUNTER — Encounter (HOSPITAL_COMMUNITY): Payer: Self-pay | Admitting: Certified Registered Nurse Anesthetist

## 2015-10-30 ENCOUNTER — Encounter: Payer: Self-pay | Admitting: Thoracic Surgery (Cardiothoracic Vascular Surgery)

## 2015-10-30 ENCOUNTER — Encounter: Payer: Self-pay | Admitting: Internal Medicine

## 2015-10-30 ENCOUNTER — Ambulatory Visit (INDEPENDENT_AMBULATORY_CARE_PROVIDER_SITE_OTHER): Payer: Medicare Other | Admitting: Thoracic Surgery (Cardiothoracic Vascular Surgery)

## 2015-10-30 VITALS — BP 103/63 | HR 45 | Resp 20 | Ht 66.0 in | Wt 143.0 lb

## 2015-10-30 DIAGNOSIS — I4819 Other persistent atrial fibrillation: Secondary | ICD-10-CM

## 2015-10-30 DIAGNOSIS — I340 Nonrheumatic mitral (valve) insufficiency: Secondary | ICD-10-CM

## 2015-10-30 DIAGNOSIS — I341 Nonrheumatic mitral (valve) prolapse: Secondary | ICD-10-CM

## 2015-10-30 DIAGNOSIS — I481 Persistent atrial fibrillation: Secondary | ICD-10-CM

## 2015-10-30 LAB — BLOOD GAS, ARTERIAL
Acid-base deficit: 3.1 mmol/L — ABNORMAL HIGH (ref 0.0–2.0)
Bicarbonate: 20.8 mEq/L (ref 20.0–24.0)
Drawn by: 42180
FIO2: 0.21
O2 Saturation: 98.7 %
Patient temperature: 98.6
TCO2: 21.8 mmol/L (ref 0–100)
pCO2 arterial: 33.4 mmHg — ABNORMAL LOW (ref 35.0–45.0)
pH, Arterial: 7.411 (ref 7.350–7.450)
pO2, Arterial: 122 mmHg — ABNORMAL HIGH (ref 80.0–100.0)

## 2015-10-30 MED ORDER — GLUTARALDEHYDE 0.625% SOAKING SOLUTION
TOPICAL | Status: DC | PRN
  Filled 2015-10-30: qty 50

## 2015-10-30 MED ORDER — DEXTROSE 5 % IV SOLN
1.5000 g | INTRAVENOUS | Status: AC
  Administered 2015-10-31: .75 g via INTRAVENOUS
  Administered 2015-10-31: 1.5 g via INTRAVENOUS
  Filled 2015-10-30 (×2): qty 1.5

## 2015-10-30 MED ORDER — PLASMA-LYTE 148 IV SOLN
INTRAVENOUS | Status: DC
  Filled 2015-10-30: qty 2.5

## 2015-10-30 MED ORDER — VANCOMYCIN HCL 10 G IV SOLR
1250.0000 mg | INTRAVENOUS | Status: AC
  Administered 2015-10-31: 1250 mg via INTRAVENOUS
  Filled 2015-10-30: qty 1250

## 2015-10-30 MED ORDER — PHENYLEPHRINE HCL 10 MG/ML IJ SOLN
30.0000 ug/min | INTRAMUSCULAR | Status: DC
  Filled 2015-10-30: qty 2

## 2015-10-30 MED ORDER — MAGNESIUM SULFATE 50 % IJ SOLN
40.0000 meq | INTRAMUSCULAR | Status: DC
Start: 2015-10-31 — End: 2015-10-31
  Filled 2015-10-30: qty 10

## 2015-10-30 MED ORDER — POTASSIUM CHLORIDE 2 MEQ/ML IV SOLN
80.0000 meq | INTRAVENOUS | Status: DC
  Filled 2015-10-30: qty 40

## 2015-10-30 MED ORDER — EPINEPHRINE HCL 1 MG/ML IJ SOLN
0.0000 ug/min | INTRAVENOUS | Status: DC
  Filled 2015-10-30: qty 4

## 2015-10-30 MED ORDER — SODIUM CHLORIDE 0.9 % IV SOLN
INTRAVENOUS | Status: AC
  Administered 2015-10-31: 1.2 [IU]/h via INTRAVENOUS
  Filled 2015-10-30: qty 2.5

## 2015-10-30 MED ORDER — DOPAMINE-DEXTROSE 3.2-5 MG/ML-% IV SOLN
0.0000 ug/kg/min | INTRAVENOUS | Status: DC
  Filled 2015-10-30: qty 250

## 2015-10-30 MED ORDER — DEXMEDETOMIDINE HCL IN NACL 400 MCG/100ML IV SOLN
0.1000 ug/kg/h | INTRAVENOUS | Status: AC
  Administered 2015-10-31: .3 ug/kg/h via INTRAVENOUS
  Filled 2015-10-30: qty 100

## 2015-10-30 MED ORDER — METOPROLOL TARTRATE 12.5 MG HALF TABLET: 12.5000 mg | ORAL_TABLET | ORAL | Status: DC

## 2015-10-30 MED ORDER — CHLORHEXIDINE GLUCONATE 0.12 % MT SOLN
15.0000 mL | OROMUCOSAL | Status: AC
  Administered 2015-10-31: 15 mL via OROMUCOSAL
  Filled 2015-10-30 (×3): qty 15

## 2015-10-30 MED ORDER — SODIUM CHLORIDE 0.9 % IV SOLN
INTRAVENOUS | Status: AC
  Administered 2015-10-31: 69.8 mL/h via INTRAVENOUS
  Filled 2015-10-30: qty 40

## 2015-10-30 MED ORDER — SODIUM CHLORIDE 0.9 % IV SOLN
INTRAVENOUS | Status: DC
  Filled 2015-10-30: qty 30

## 2015-10-30 MED ORDER — NITROGLYCERIN IN D5W 200-5 MCG/ML-% IV SOLN
2.0000 ug/min | INTRAVENOUS | Status: DC
  Filled 2015-10-30: qty 250

## 2015-10-30 MED ORDER — DEXTROSE 5 % IV SOLN
750.0000 mg | INTRAVENOUS | Status: DC
  Filled 2015-10-30: qty 750

## 2015-10-30 NOTE — Progress Notes (Signed)
[address]       [place] [address]             [phone]     CARDIOTHORACIC SURGERY OFFICE NOTE  Referring Provider is Nahser, Wonda Cheng, MD PCP is [name], MD   HPI:  Patient returns to the office today for follow-up mitral regurgitation and atrial fibrillation with tentative plans to proceed with surgery tomorrow. She was originally seen in consultation on 10/19/2015. She reports no new problem for complaints over the last 10 days. She admits to persistent symptoms of exertional shortness of breath and she experienced a brief episode of resting shortness breath while lying flat in bed trying to sleep last week.   Current Outpatient Prescriptions  Medication Sig Dispense Refill  . albuterol (PROVENTIL HFA;VENTOLIN HFA) 108 (90 BASE) MCG/ACT inhaler Inhale 2 puffs into the lungs every 6 (six) hours as needed for wheezing or shortness of breath. 1 Inhaler 2  . ALPRAZolam (XANAX) 0.25 MG tablet Take 1 tablet (0.25 mg total) by mouth at bedtime as needed for anxiety or sleep. 30 tablet 5  . amiodarone (PACERONE) 200 MG tablet Take 1 tablet (200 mg total) by mouth 2 (two) times daily. 30 tablet 0  . azelastine (ASTELIN) 0.1 % nasal spray Place 1 spray into both nostrils daily.  5  . butalbital-acetaminophen-caffeine (FIORICET, ESGIC) 50-325-40 MG per tablet TAKE 1 TABLET BY MOUTH AS NEEDED FOR BACK PAIN OR HEADACHE 90 tablet 0  . furosemide (LASIX) 40 MG tablet Take 40 mg by mouth daily as needed for fluid or edema. Only if gain 2 lbs or more    . metoprolol (LOPRESSOR) 50 MG tablet Take 0.5 tablets (25 mg total) by mouth 2 (two) times daily. 180 tablet 3  . montelukast (SINGULAIR) 10 MG tablet Take 10 mg by mouth at bedtime.      . rosuvastatin (CRESTOR) 5 MG tablet Take 1 tablet (5 mg total) by mouth every other day. 45 tablet 3  . apixaban (ELIQUIS) 5 MG TABS tablet Take 1 tablet (5 mg total) by mouth 2 (two) times daily. (Patient not taking: Reported  on 10/30/2015) 60 tablet 11   No current facility-administered medications for this visit.   Facility-Administered Medications Ordered in Other Visits  Medication Dose Route Frequency Provider Last Rate Last Dose  . [START ON 10/31/2015] aminocaproic acid (AMICAR) 10 g in sodium chloride 0.9 % 100 mL infusion   Intravenous To OR Rexene Alberts, MD      . Derrill Memo ON 10/31/2015] cefUROXime (ZINACEF) 1.5 g in dextrose 5 % 50 mL IVPB  1.5 g Intravenous To OR Rexene Alberts, MD      . Derrill Memo ON 10/31/2015] cefUROXime (ZINACEF) 750 mg in dextrose 5 % 50 mL IVPB  750 mg Intravenous To OR Rexene Alberts, MD      . Derrill Memo ON 10/31/2015] chlorhexidine (PERIDEX) 0.12 % solution 15 mL  15 mL Mouth/Throat To SS-Surg Rexene Alberts, MD      . Derrill Memo ON 10/31/2015] dexmedetomidine (PRECEDEX) 400 MCG/100ML (4 mcg/mL) infusion  0.1-0.7 mcg/kg/hr Intravenous To OR Rexene Alberts, MD      . Derrill Memo ON 10/31/2015] DOPamine (INTROPIN) 800 mg in dextrose 5 % 250 mL (3.2 mg/mL) infusion  0-10 mcg/kg/min Intravenous To OR Rexene Alberts, MD      . Derrill Memo ON 10/31/2015] EPINEPHrine (ADRENALIN) 4 mg in dextrose 5 % 250 mL (0.016 mg/mL) infusion  0-10 mcg/min  Intravenous To OR Rexene Alberts, MD      . glutaraldehyde 0.625% soaking solution   Topical PRN Rexene Alberts, MD      . Derrill Memo ON 10/31/2015] heparin 2,500 Units, papaverine 30 mg in electrolyte-148 (PLASMALYTE-148) 500 mL irrigation   Irrigation To OR Rexene Alberts, MD      . Derrill Memo ON 10/31/2015] heparin 30,000 units/NS 1000 mL solution for CELLSAVER   Other To OR Rexene Alberts, MD      . Derrill Memo ON 10/31/2015] insulin regular (NOVOLIN R,HUMULIN R) 250 Units in sodium chloride 0.9 % 250 mL (1 Units/mL) infusion   Intravenous To OR Rexene Alberts, MD      . Derrill Memo ON 10/31/2015] magnesium sulfate (IV Push/IM) injection 40 mEq  40 mEq Other To OR Rexene Alberts, MD      . Derrill Memo ON 10/31/2015] metoprolol tartrate (LOPRESSOR) tablet 12.5 mg  12.5 mg Oral To  SS-Surg Rexene Alberts, MD      . Derrill Memo ON 10/31/2015] nitroGLYCERIN 50 mg in dextrose 5 % 250 mL (0.2 mg/mL) infusion  2-200 mcg/min Intravenous To OR Rexene Alberts, MD      . Derrill Memo ON 10/31/2015] phenylephrine (NEO-SYNEPHRINE) 20 mg in dextrose 5 % 250 mL (0.08 mg/mL) infusion  30-200 mcg/min Intravenous To OR Rexene Alberts, MD      . Derrill Memo ON 10/31/2015] potassium chloride injection 80 mEq  80 mEq Other To OR Rexene Alberts, MD      . Derrill Memo ON 10/31/2015] vancomycin (VANCOCIN) 1,250 mg in sodium chloride 0.9 % 250 mL IVPB  1,250 mg Intravenous To OR Rexene Alberts, MD          Physical Exam:   BP 103/63 mmHg  Pulse 45  Resp 20  Ht 5\' 6"  (1.676 m)  Wt 143 lb (64.864 kg)  BMI 23.09 kg/m2  SpO2 97%  LMP  (Exact Date)  General:  Well-appearing  Chest:   Clear to auscultation  CV:   Regular rate and rhythm with prominent systolic murmur  Incisions:  n/a  Abdomen:  Soft and nontender  Extremities:  Warm and well-perfused  Diagnostic Tests:  CT ANGIOGRAPHY CHEST, ABDOMEN AND PELVIS  TECHNIQUE: Multidetector CT imaging through the chest, abdomen and pelvis was performed using the standard protocol during bolus administration of intravenous contrast. Multiplanar reconstructed images and MIPs were obtained and reviewed to evaluate the vascular anatomy.  CONTRAST: 170 mL OMNIPAQUE IOHEXOL 350 MG/ML SOLN  COMPARISON: 08/15/2015 and previous  FINDINGS: CHEST  Noncontrast scout images were not obtained. Right arm IV contrast administration. The SVC is patent. Four-chamber cardiac enlargement. Minimal contrast reflux from the right atrium into the IVC. Dilated central pulmonary arteries. Satisfactory opacification of pulmonary arteries noted, and there is no evidence of pulmonary emboli. There is an accessory superior segment right lower lobe pulmonary vein. Mitral leaflet calcifications. Scattered coronary calcifications. Incomplete opacification of the aorta  secondary to scan timing, with no evidence of dissection, aneurysm, or stenosis. There is classic 3-vessel brachiocephalic arch anatomy without suggestion of proximal stenosis.  No pleural or pericardial effusion. No hilar or mediastinal adenopathy. Calcified subpleural granuloma in the anterior right upper lobe. 6 mm subpleural nodule, lateral basal segment right lower lobe image 30/10, stable since previous. 5 mm pleural-based nodule, anterior basal segment right lower lobe image 18. Linear scarring or subsegmental atelectasis medially in the right middle lobe. Lungs otherwise clear. Mild thoracic levoscoliosis, with minimal spurring in the lower thoracic  spine. Sternum intact.  Review of the MIP images confirms the above findings.  ABDOMEN  Arterial findings:  Aorta: Incomplete opacification secondary to scan timing. Scattered atheromatous calcifications. Mild tortuosity. No aneurysm, dissection, or stenosis.  Celiac axis: Nonocclusive partially calcified ostial plaque, patent distally  Superior mesenteric: Nonocclusive partially calcified ostial plaque, patent distally  Left renal: Single, patent  Right renal: Single, with partially calcified ostial plaque, no convincing high-grade stenosis, patent distally.  Inferior mesenteric: Patent  Left iliac: Scattered calcified nonocclusive plaque through the common iliac and in the proximal internal iliac. External iliac is mildly ectatic, patent. Eccentric nonocclusive plaque in the common femoral artery noted.  Right iliac: Nonocclusive scattered calcified plaque through the common iliac, in the proximal internal iliac, and in the common femoral artery. External iliac is widely patent.  Venous findings: Patent hepatic veins, portal vein, SMV, splenic vein, bilateral renal veins, IVC, and iliac venous system.  Review of the MIP images confirms the above findings.  Nonvascular findings:  Innumerable hepatic cysts versus peliosis, stable. Unremarkable spleen, pancreas. Bilateral renal cysts, largest parapelvic 2.3 cm lower pole on the left, on the right 19 mm midpole. No hydronephrosis or solid renal lesion. Stomach , small bowel, colon nondilated. Normal appendix. Urinary bladder incompletely distended. Uterus and adnexal regions grossly unremarkable. No ascites. No free air. No adenopathy. Lumbar dextroscoliosis apex L1-2 with multilevel spondylitic changes and facet DJD.  IMPRESSION: 1. Dilated central pulmonary arteries suggesting pulmonary hypertension. 2. Atherosclerosis, including aortoiliac and coronary artery disease. Please note that although the presence of coronary artery calcium documents the presence of coronary artery disease, the severity of this disease and any potential stenosis cannot be assessed on this non-gated CT examination. Assessment for potential risk factor modification, dietary therapy or pharmacologic therapy may be warranted, if clinically indicated. 3. Negative for acute PE or aortic aneurysm. 4. Little interval change in hepatic cysts versus peliosis.   Electronically Signed  By: Lucrezia Europe M.D.  On: 10/27/2015 09:48   Impression:  Patient has stage D severe symptomatic primary mitral regurgitation with history of recent onset persistent atrial fibrillation. She presents with symptoms of chronic diastolic congestive heart failure that became acutely worse this past August, presumably at the time the patient initially developed persistent atrial fibrillation. She has been doing much better recently since she underwent DC cardioversion. I have personally reviewed the patient's recent transthoracic and transesophageal echocardiograms and diagnostic cardiac catheterization. 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 There is severe mitral regurgitation. Left ventricular systolic function is preserved. The patient does not have significant coronary artery disease and pulmonary artery pressures are only mildly elevated. I agree the patient needs mitral valve repair or replacement and would likely benefit from concomitant Maze procedure. Although there remains a possibility her mitral valve could be repaired, the degree of extensive calcification significantly increases the likelihood that mitral valve replacement will be required. She may be a good candidate for minimally invasive approach for surgery.    Plan:  The patient was again counseled at length regarding indications, risks, and potential benefits of mitral valve repair or replacement and Maze procedure.  We have reviewed the results of her recent CT angiogram and routine preoperative blood work.  Alternative treatment strategies been discussed. All of her questions have been addressed. We plan to proceed with surgery tomorrow as originally scheduled.   I spent  in excess of 30 minutes during the conduct of this office consultation and >50% of this time involved direct face-to-face encounter with the patient for counseling and/or coordination of their care.   Valentina Gu. Roxy Manns, MD 10/30/2015 2:52 PM

## 2015-10-30 NOTE — Anesthesia Preprocedure Evaluation (Addendum)
Anesthesia Evaluation  Patient identified by MRN, date of birth, ID band Patient awake    Reviewed: Allergy & Precautions, NPO status , Patient's Chart, lab work & pertinent test results, reviewed documented beta blocker date and time   Airway Mallampati: II  TM Distance: >3 FB Neck ROM: Full    Dental   Pulmonary shortness of breath and with exertion,    breath sounds clear to auscultation       Cardiovascular +CHF  + dysrhythmias Atrial Fibrillation  Rhythm:Regular     Neuro/Psych  Headaches, Anxiety Depression    GI/Hepatic   Endo/Other    Renal/GU      Musculoskeletal   Abdominal   Peds  Hematology   Anesthesia Other Findings   Reproductive/Obstetrics                            Anesthesia Physical Anesthesia Plan  ASA: IV  Anesthesia Plan: General   Post-op Pain Management:    Induction: Intravenous  Airway Management Planned: Oral ETT and Double Lumen EBT  Additional Equipment: Arterial line, CVP, PA Cath, TEE, 3D TEE and Ultrasound Guidance Line Placement  Intra-op Plan:   Post-operative Plan: Possible Post-op intubation/ventilation  Informed Consent: I have reviewed the patients History and Physical, chart, labs and discussed the procedure including the risks, benefits and alternatives for the proposed anesthesia with the patient or authorized representative who has indicated his/her understanding and acceptance.   Dental advisory given  Plan Discussed with: Anesthesiologist, CRNA and Surgeon  Anesthesia Plan Comments:         Anesthesia Quick Evaluation

## 2015-10-30 NOTE — H&P (Signed)
[address]       [place] [address]             [phone]          CARDIOTHORACIC SURGERY HISTORY AND PHYSICAL EXAM  Referring Provider is Nahser, Wonda Cheng, MD PCP is [name], MD  Chief Complaint  Patient presents with  . Mitral Regurgitation    surgery eval...PFT 09/01/15, ECHO 10/10/15, CATH 10/18/15    HPI:  Patient is a 74 year old female from [place] with long-standing history of mitral valve prolapse, recurrent paroxysmal SVT status post ablation, and recent onset persistent atrial fibrillation associated with acute exacerbation of chronic diastolic congestive heart failure, who has been referred for surgical consultation to discuss the management of severe mitral regurgitation. The patient states that she has been told that she had mitral valve prolapse and a heart murmur for most of her life. She developed recurrent paroxysmal SVT for which she underwent ablation in 2003 and again in 2007. At the time she lived in [place]. In 2008 she moved to [place] to be close to family and reestablish care locally with Dr. Acie Fredrickson who has been following her intermittently ever since. She remained stable and asymptomatic from a cardiac standpoint until this past summer when the patient began to develop symptoms of exertional shortness of breath. Initially she attributed these symptoms to worsening allergies. In August she developed an acute episode of shortness of breath, weakness, and chest tightness while she was traveling in [place]. She was hospitalized for several days in [place] where she was diagnosed with atrial fibrillation, mitral regurgitation, and congestive heart failure. She was initially anticoagulated using Lovenox and treated with diltiazem and Lasix. She returned to [facility] and has been followed carefully ever since by Dr. Acie Fredrickson. She was started on coumadin and underwent TEE guided cardioversion on 08/22/2015. TEE  revealed bileaflet prolapse with calcified leaflets and moderate-severe mitral regurgitation. Follow-up transthoracic echocardiogram performed 10/10/2015 Confirmed the presence of severely calcified mitral valve leaflets with restricted leaflet mobility and severe mitral regurgitation. Left ventricular size and systolic function remains normal. There is severe left atrial enlargement. The patient underwent left and right heart catheterization by Dr. Burt Knack on 10/18/2015. Catheterization was notable for the absence of significant coronary artery disease and only mild elevation of pulmonary artery pressures but hemodynamic and angiographic findings consistent with severe mitral regurgitation. The patient was referred for elective surgical consultation.  The patient is widowed and lives alone in [place]. She has 2 grown children including a son who lives close by who also has mitral valve prolapse with mitral regurgitation and atrial fibrillation. She has remained physically active and functionally independent all of her life. 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 She describes mild symptoms of exertional shortness of breath at present. She denies any resting shortness of breath, PND, orthopnea, dizzy spells, or syncope. She has never had any chest pain or chest tightness. She has had some mild lower extremity edema. She watches her weight carefully and uses Lasix as needed. She has not required Lasix for swelling or increased shortness of breath since she underwent cardioversion. She has had a dry cough in the past that has improved with diuretic therapy.   Patient returns to the office today for follow-up mitral regurgitation and atrial fibrillation with tentative plans to proceed with surgery tomorrow. She was originally seen in consultation on 10/19/2015. She reports no new problem for complaints over  the last 10 days. She admits to persistent symptoms of exertional shortness of breath and  she experienced a brief episode of resting shortness breath while lying flat in bed trying to sleep last week.           Past Medical History  Diagnosis Date  . SVT (supraventricular tachycardia) ([place])   . MVP (mitral valve prolapse)   . Hyperlipidemia   . Idiopathic thrombocytopenic purpura (ITP)   . Hemorrhoid   . IBS (irritable bowel syndrome)   . Migraine   . Severe mitral regurgitation   . RBBB   . Anxiety associated with depression     Prn alprazolam   . Thoracic aorta atherosclerosis ([place])   . Nodule of right lung   . Restrictive lung disease     Mild on PFT & likely cardiac in etiology   . 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 Atrial fibrillation, persistent ([place])     DCCV 08/22/2015  . Acute on chronic diastolic (congestive) heart failure [facility])     Past Surgical History  Procedure Laterality Date  . Tubal ligation    . Foot surgery    . Varicose vein surgery Right   . Mandible fracture surgery  03-26-13  . Colonoscopy  2003  . Eye surgery Right 2013  . Breast biopsy    . Cardioversion N/A 08/22/2015    Procedure: CARDIOVERSION;  Surgeon: Thayer Headings, MD;  Location: [facility] ENDOSCOPY;  Service: Cardiovascular;  Laterality: N/A;  . Tee without cardioversion N/A 08/22/2015    Procedure: TRANSESOPHAGEAL ECHOCARDIOGRAM (TEE);  Surgeon: Thayer Headings, MD;  Location: [facility] ENDOSCOPY;  Service: Cardiovascular;  Laterality: N/A;  Darden Dates with cardioversion    . Cardiac catheterization N/A 10/18/2015    Procedure: Right/Left Heart Cath and Coronary Angiography;  Surgeon: Sherren Mocha, MD;  Location: [place] CV LAB;  Service: Cardiovascular;  Laterality: N/A;    Family History  Problem Relation Age of Onset  . Hypertension Mother   . Arrhythmia Mother   . Heart failure Mother   . Arrhythmia Brother   . Stroke Brother 60    cerebral hemorrhage, nonsmoker, no HTN  . Stroke Father     from an aneurysm  . Stroke Maternal Aunt 83    cerebral hemorrhage  . Liver cancer  Maternal Grandmother   . Atrial fibrillation Son   . Heart attack Neg Hx     Social History Social History  Substance Use Topics  . Smoking status: Never Smoker   . Smokeless tobacco: Never Used  . Alcohol Use: No    Prior to Admission medications   Medication Sig Start Date End Date Taking? Authorizing Provider  albuterol (PROVENTIL HFA;VENTOLIN HFA) 108 (90 BASE) MCG/ACT inhaler Inhale 2 puffs into the lungs every 6 (six) hours as needed for wheezing or shortness of breath. 08/15/15   Thayer Headings, MD  ALPRAZolam Duanne Moron) 0.25 MG tablet Take 1 tablet (0.25 mg total) by mouth at bedtime as needed for anxiety or sleep. 08/18/15   [name], MD  amiodarone (PACERONE) 200 MG tablet Take 1 tablet (200 mg total) by mouth 2 (two) times daily. 10/19/15   Rexene Alberts, MD  apixaban (ELIQUIS) 5 MG TABS tablet Take 1 tablet (5 mg total) by mouth 2 (two) times daily. Patient not taking: Reported on 10/30/2015 08/08/15   Thayer Headings, MD  azelastine (ASTELIN) 0.1 % nasal spray Place 1 spray into both nostrils daily. 09/05/15   Historical Provider,  MD  butalbital-acetaminophen-caffeine (FIORICET, ESGIC) 50-325-40 MG per tablet TAKE 1 TABLET BY MOUTH AS NEEDED FOR BACK PAIN OR HEADACHE 05/03/15   [name], MD  furosemide (LASIX) 40 MG tablet Take 40 mg by mouth daily as needed for fluid or edema. Only if gain 2 lbs or more    Historical Provider, MD  metoprolol (LOPRESSOR) 50 MG tablet Take 0.5 tablets (25 mg total) by mouth 2 (two) times daily. 08/22/15   Thayer Headings, MD  montelukast (SINGULAIR) 10 MG tablet Take 10 mg by mouth at bedtime.      Historical Provider, MD  rosuvastatin (CRESTOR) 5 MG tablet Take 1 tablet (5 mg total) by mouth every other day. 08/08/15   Thayer Headings, MD    Allergies  Allergen Reactions  . Oxycodone-Acetaminophen Other (See Comments)    Other Reaction: migraine  . Meloxicam Other (See Comments)    Severe reflux  . Codeine Other (See Comments)     migraine  . Doxycycline Itching  . Hydrocodone Nausea And Vomiting    MIGRAINE  . Hydromorphone Nausea And Vomiting  . Oxycodone Other (See Comments)    Severe migraine      Review of Systems:  General:normal appetite, normal energy, no weight gain, no weight loss, no fever Cardiac:no chest pain with exertion, no chest pain at rest, + SOB with exertion, no resting SOB, no PND, no orthopnea, + palpitations, + arrhythmia, + atrial fibrillation, + LE edema, no dizzy spells, no syncope Respiratory:+ shortness of breath, no home oxygen, no productive cough, + dry cough, no bronchitis, no wheezing, no hemoptysis, no asthma, no pain with inspiration or cough, no sleep apnea, no CPAP at night GI:no difficulty swallowing, no reflux, no frequent heartburn, no hiatal hernia, no abdominal pain, no constipation, no diarrhea, no hematochezia, no hematemesis, no melena GU:no dysuria, no frequency, no urinary tract infection, no hematuria, no kidney stones, no kidney disease Vascular:no pain suggestive of claudication, no pain in feet, no leg cramps, no varicose veins, no DVT, no non-healing foot ulcer Neuro:no stroke, no TIA's, no seizures, + occasional migraine headaches, no temporary blindness one eye, no slurred speech, no peripheral neuropathy, no chronic pain, no instability of gait, no memory/cognitive dysfunction Musculoskeletal:+ arthritis in lower back, no joint swelling, no myalgias, no difficulty walking, normal mobility  Skin:no rash, no itching, no skin infections, no pressure sores or ulcerations Psych:no anxiety, no depression, no nervousness, no  unusual recent stress Eyes:no blurry vision, + floaters, no recent vision changes, + wears glasses or contacts ENT:no hearing loss, no loose or painful teeth, no dentures, last saw dentist 09/01/2015 Hematologic:+ easy bruising, no abnormal bleeding, no clotting disorder, no frequent epistaxis Endocrine:no diabetes, does not check CBG's at home   Physical Exam:  BP 114/65 mmHg  Pulse 55  Resp 16  Ht 5\' 6"  (1.676 m)  Wt 138 lb (62.596 kg)  BMI 22.28 kg/m2  LMP (Exact Date) General: well-appearing HEENT:Unremarkable  Neck:no JVD, no bruits, no adenopathy  Chest:clear to auscultation, symmetrical breath sounds, no wheezes, no rhonchi  [id number] rhythm, grade IV/VI harsh systolic murmur  Abdomen:soft, non-tender, no masses  Extremities:warm, well-perfused, pulses palpable, no LE edema Rectal/GUDeferred Neuro:Grossly non-focal and symmetrical throughout Skin:Clean and dry, no rashes, no breakdown   Diagnostic Tests:  Transesophageal Echocardiography  (Report amended )  Patient:  Victorina, Grotheer MR #:    [id number] Study Date: 08/22/2015 Gender:   F Age:    48 Height:   167.6 cm  Weight:   62.3 kg BSA:    1.7 m^2 Pt. Status: Room:  ATTENDING  Mertie Moores, M.D. PERFORMING  Mertie Moores, M.D. REFERRING  Mertie Moores, M.D. SONOGRAPHER Tresa Res, RDCS ADMITTING  Nahser, Peggyann Juba   Nahser,  Jr  cc:  -------------------------------------------------------------------  ------------------------------------------------------------------- Indications:   Atrial fibrillation - 427.31.  ------------------------------------------------------------------- Study Conclusions  - Left ventricle: The cavity size was normal. Wall thickness was normal. Systolic function was normal. - Aortic valve: No evidence of vegetation. - Mitral valve: Moderate prolapse, involving the anterior leaflet and the posterior leaflet. There was moderate to severe regurgitation. - Left atrium: No evidence of thrombus in the atrial cavity or appendage.  Impressions:  - The patient is very sensitive to propofol. She developed marked hypotension requiring neosynephrine during the procedure and became apneic several times.  She was suggessfully cardioverted after the TEE  Diagnostic transesophageal echocardiography. 2D and color Doppler. Birthdate: Patient birthdate: [birth year]. Age: Patient is 74 yr old. Sex: Gender: female.  BMI: 22.2 kg/m^2. Blood pressure: 118/78 Patient status: Outpatient. Study date: Study date: 08/22/2015. Study time: 01:08 PM. Location: Endoscopy.  -------------------------------------------------------------------  ------------------------------------------------------------------- Left ventricle: The cavity size was normal. Wall thickness was normal. Systolic function was normal.  ------------------------------------------------------------------- Aortic valve:  Structurally normal valve.  Cusp separation was normal. No evidence of vegetation. Doppler: There was no regurgitation.  ------------------------------------------------------------------- Aorta: The aorta was not dilated, mildly calcified, and non-diseased.  ------------------------------------------------------------------- Mitral valve:  Moderate prolapse,  involving the anterior leaflet and the posterior leaflet. Doppler: There was moderate to severe regurgitation.  ------------------------------------------------------------------- Left atrium:  No evidence of thrombus in the atrial cavity or appendage.  ------------------------------------------------------------------- Tricuspid valve:  Doppler: There was trivial regurgitation.  ------------------------------------------------------------------- Post procedure conclusions Ascending Aorta:  - The aorta was not dilated, mildly calcified, and non-diseased.  ------------------------------------------------------------------- Jonne Ply, M.D. 2016-10-11T13:49:57   Transthoracic Echocardiography  Patient:  Adalea, Zborowski MR #:    [id number] Study Date: 10/10/2015 Gender:   F Age:    38 Height:   167.6 cm Weight:   61.2 kg BSA:    1.69 m^2 Pt. Status: Room:  REFERRING  Mertie Moores, M.D. SONOGRAPHER Wyatt Mage, [place], Outpatient ATTENDING  Skeet Latch, MD Rex Kras, Jr  cc:  -------------------------------------------------------------------  ------------------------------------------------------------------- Indications:   Mitral valve prolpase (I34.1).  ------------------------------------------------------------------- History:  PMH:  Mitral valve prolapse. Mitral stenosis. Risk factors: SVT. RBBB. Pulmonary hypertension. Osteoporosis. Palpitation. Thrombocytopenia. Lumbago. Dyslipidemia.  ------------------------------------------------------------------- Study Conclusions  - Left ventricle: The cavity size was normal. Systolic function was normal. Wall motion was normal; there were no regional wall motion abnormalities. - Mitral valve: Calcified annulus. Severely thickened, severely calcified leaflets . Thickening. Mobility was restricted. Prolapse, involving  the anterior leaflet and the posterior leaflet. The findings are consistent with moderate to severe stenosis. There was severe regurgitation. Valve area by pressure half-time: 1.49 cm^2. Valve area by continuity equation (using LVOT flow): 0.95 cm^2. Ability to accurately assess the severity of MS due to atrial fibrillation. - Left atrium: The atrium was severely dilated. - Right atrium: The atrium was mildly dilated. - Atrial septum: No defect or patent foramen ovale was identified. - Pulmonic valve: There was moderate regurgitation. - Pulmonary arteries: PA peak pressure: 44 mm Hg (S). - Pericardium, extracardiac: A trivial pericardial effusion was identified.  ------------------------------------------------------------------- Labs, prior tests, procedures, and surgery: Transesophageal echocardiography (08/23/2015).  Transthoracic echocardiography. M-mode, complete 2D, spectral Doppler, and color Doppler. Birthdate: Patient birthdate: Apr 06, 1941. Age:  Patient is 74 yr old. Sex: Gender: female. BMI: 21.8 kg/m^2. Blood pressure:   102/58 Patient status: Outpatient. Study date: Study date: 10/10/2015. Study time: 09:43 AM. Location: [facility]  -------------------------------------------------------------------  ------------------------------------------------------------------- Left ventricle: The cavity size was normal. Systolic function was normal. Wall motion was normal; there were no regional wall motion abnormalities.  ------------------------------------------------------------------- Aortic valve:  Trileaflet; normal thickness leaflets. Mobility was not restricted. Doppler: Transvalvular velocity was within the normal range. There was no stenosis. There was no regurgitation.  ------------------------------------------------------------------- Aorta: Aortic root: The aortic root was normal in  size.  ------------------------------------------------------------------- Mitral valve:  Calcified annulus. Severely thickened, severely calcified leaflets . Thickening. Mobility was restricted. Prolapse, involving the anterior leaflet and the posterior leaflet. Ability to accurately assess the severity of MS due to atrial fibrillation. Doppler:  The findings are consistent with moderate to severe stenosis.  There was severe regurgitation.  Valve area by pressure half-time: 1.49 cm^2. Indexed valve area by pressure half-time: 0.88 cm^2/m^2. Valve area by continuity equation (using LVOT flow): 0.95 cm^2. Indexed valve area by continuity equation (using LVOT flow): 0.56 cm^2/m^2.  Mean gradient (D): 5 mm Hg.  ------------------------------------------------------------------- Left atrium: The atrium was severely dilated.  ------------------------------------------------------------------- Atrial septum: No defect or patent foramen ovale was identified.  ------------------------------------------------------------------- Right ventricle: The cavity size was normal. Wall thickness was normal. Systolic function was normal.  ------------------------------------------------------------------- Pulmonic valve:  The valve appears to be grossly normal. Doppler: Transvalvular velocity was within the normal range. There was no evidence for stenosis. There was moderate regurgitation.  ------------------------------------------------------------------- Tricuspid valve:  Structurally normal valve.  Doppler: Transvalvular velocity was within the normal range. There was mild regurgitation.  ------------------------------------------------------------------- Pulmonary artery:  The main pulmonary artery was normal-sized. Systolic pressure was within the normal range.  ------------------------------------------------------------------- Right atrium: The atrium was mildly  dilated.  ------------------------------------------------------------------- Pericardium: A trivial pericardial effusion was identified.  ------------------------------------------------------------------- Systemic veins: Inferior vena cava: The vessel was dilated. The respirophasic diameter changes were blunted (< 50%), consistent with elevated central venous pressure.  ------------------------------------------------------------------- Measurements  Left ventricle              Value     Reference LV ID, ED, PLAX chordal          49.2 mm    43 - 52 LV ID, ES, PLAX chordal          26  mm    23 - 38 LV fx shortening, PLAX chordal      47  %    >=29 LV PW thickness, ED            8.38 mm    --------- IVS/LV PW ratio, ED            1.04      <=1.3 Stroke volume, 2D             51  ml    --------- Stroke volume/bsa, 2D           30  ml/m^2  ---------  Ventricular septum            Value     Reference IVS thickness, ED             8.71 mm    ---------  LVOT                   Value     Reference LVOT ID, S  20  mm    --------- LVOT area                 3.14 cm^2   --------- LVOT peak velocity, S           101  cm/s   --------- LVOT mean velocity, S           54.4 cm/s   --------- LVOT VTI, S                16.1 cm    --------- LVOT peak gradient, S           4   mm Hg  ---------  Aorta                   Value     Reference Aortic root ID, ED            34  mm    ---------  Left atrium                Value     Reference LA ID, A-P, ES              49  mm    --------- LA ID/bsa, A-P           (H)   2.9  cm/m^2  <=2.2 LA volume, S               131  ml    --------- LA volume/bsa, S             77.5 ml/m^2  --------- LA volume, ES, 1-p A4C          126  ml    --------- LA volume/bsa, ES, 1-p A4C        74.6 ml/m^2  --------- LA volume, ES, 1-p A2C          130  ml    --------- LA volume/bsa, ES, 1-p A2C        76.9 ml/m^2  ---------  Mitral valve               Value     Reference Mitral mean velocity, D          103  cm/s   --------- Mitral pressure half-time         149  ms    --------- Mitral mean gradient, D          5   mm Hg  --------- Mitral valve area, PHT, DP        1.49 cm^2   --------- Mitral valve area/bsa, PHT, DP      0.88 cm^2/m^2 --------- Mitral valve area, LVOT          0.95 cm^2   --------- continuity Mitral valve area/bsa, LVOT        0.56 cm^2/m^2 --------- continuity Mitral annulus VTI, D           53.3 cm    ---------  Pulmonary arteries            Value     Reference PA pressure, S, DP        (H)   44  mm Hg  <=30  Tricuspid valve              Value     Reference Tricuspid regurg peak velocity      300  cm/s   --------- Tricuspid peak RV-RA gradient       36  mm Hg  ---------  Systemic veins              Value     Reference Estimated CVP               8   mm Hg  ---------  Right ventricle              Value     Reference RV pressure, S, DP        (H)   44  mm Hg  <=30  Legend: (L) and (H) mark values outside specified reference range.  ------------------------------------------------------------------- Prepared and Electronically Authenticated by  Skeet Latch,  MD 2016-11-08T14:18:07   CARDIAC CATHETERIZATION  Procedures    Right/Left Heart Cath and Coronary Angiography    Conclusion    1. Patent coronary arteries without significant obstruction 2. Severe mitral regurgitation by hemodynamic and angiographic assessment 3. There is a small diastolic gradient on simultaneous LV/PCWP measurement. There are large V waves present. Valve area calculation is not calculated.  Plan: upcoming evaluation with Dr Roxy Manns for MV repair/replacement +/- Maze       Indications    Severe mitral regurgitation [I34.0 (ICD-10-CM)]    Technique and Indications    INDICATION: Severe, symptomatic mitral valve regurgitation.  PROCEDURAL DETAILS: There was an indwelling IV in a right antecubital vein. Using normal sterile technique, the IV was changed out for a 5 Fr brachial sheath over a 0.018 inch wire. The right wrist was then prepped, draped, and anesthetized with 1% lidocaine. Using the modified Seldinger technique a 5/6 French Slender sheath was placed in the right radial artery. Intra-arterial verapamil was administered through the radial artery sheath. IV heparin was administered after a JR4 catheter was advanced into the central aorta. A Swan-Ganz catheter was used for the right heart catheterization. Standard protocol was followed for recording of right heart pressures and sampling of oxygen saturations. Fick cardiac output was calculated. Standard Judkins catheters were used for selective coronary angiography and left ventriculography. There were no immediate procedural complications. The patient was transferred to the post catheterization recovery area for further monitoring.   Estimated blood loss <50 mL. There were no immediate complications during the procedure.    Coronary Findings    Dominance: Left   Left Anterior Descending  Large vessel, wraps around the LV apex. No significant obstruction.     Left  Circumflex  Large, codominant circumflex with no significant obstruction     Right Coronary Artery  Medium caliber RCA with a large Acute Marginal branch and small PDA branch. No significant stenosis.       Right Heart Pressures Hemodynamic findings consistent with mitral valve regurgitation. LV EDP is normal.    Wall Motion                 Left Heart    Left Ventricle The left ventricular size is normal. There is hyperdynamic left ventricular systolic function. The left ventricular ejection fraction is greater tha 65% by visual estimate.   Mitral Valve There is mild mitral valve stenosis, severe (4+) mitral regurgitation and mitral valve prolapse present. There is severe MR by angiographic criteria. There is mitral valve prolapse present. The mitral valve apparatus appears calcified without severe annular calcium.    Coronary Diagrams    Diagnostic Diagram            Implants    Name ID Temporary Type Supply   No information to display    PACS Images  Show images for Cardiac catheterization     Link to Procedure Log    Procedure Log      Hemo Data       Most Recent Value   RA A Wave  8 mmHg   RA V Wave  7 mmHg   RA Mean  4 mmHg   RV Systolic Pressure  37 mmHg   RV Diastolic Pressure  1 mmHg   RV EDP  8 mmHg   PA Systolic Pressure  37 mmHg   PA Diastolic Pressure  10 mmHg   PA Mean  20 mmHg   PW A Wave  24 mmHg   PW V Wave  27 mmHg   PW Mean  16 mmHg   AO Systolic Pressure  28 mmHg   AO Diastolic Pressure  11 mmHg   AO Mean  17 mmHg   LV Systolic Pressure  99 mmHg   LV Diastolic Pressure  2 mmHg   LV EDP  15 mmHg   Arterial Occlusion Pressure Extended Systolic Pressure  [id number] mmHg   Arterial Occlusion Pressure Extended Diastolic Pressure  51 mmHg   Arterial Occlusion  Pressure Extended Mean Pressure  71 mmHg   Left Ventricular Apex Extended Systolic Pressure  123456 mmHg   Left Ventricular Apex Extended Diastolic Pressure  3 mmHg   Left Ventricular Apex Extended EDP Pressure  11 mmHg     CT ANGIOGRAPHY CHEST, ABDOMEN AND PELVIS  TECHNIQUE: Multidetector CT imaging through the chest, abdomen and pelvis was performed using the standard protocol during bolus administration of intravenous contrast. Multiplanar reconstructed images and MIPs were obtained and reviewed to evaluate the vascular anatomy.  CONTRAST: 170 mL OMNIPAQUE IOHEXOL 350 MG/ML SOLN  COMPARISON: 08/15/2015 and previous  FINDINGS: CHEST  Noncontrast scout images were not obtained. Right arm IV contrast administration. The SVC is patent. Four-chamber cardiac enlargement. Minimal contrast reflux from the right atrium into the IVC. Dilated central pulmonary arteries. Satisfactory opacification of pulmonary arteries noted, and there is no evidence of pulmonary emboli. There is an accessory superior segment right lower lobe pulmonary vein. Mitral leaflet calcifications. Scattered coronary calcifications. Incomplete opacification of the aorta secondary to scan timing, with no evidence of dissection, aneurysm, or stenosis. There is classic 3-vessel brachiocephalic arch anatomy without suggestion of proximal stenosis.  No pleural or pericardial effusion. No hilar or mediastinal adenopathy. Calcified subpleural granuloma in the anterior right upper lobe. 6 mm subpleural nodule, lateral basal segment right lower lobe image 30/10, stable since previous. 5 mm pleural-based nodule, anterior basal segment right lower lobe image 18. Linear scarring or subsegmental atelectasis medially in the right middle lobe. Lungs otherwise clear. Mild thoracic levoscoliosis, with minimal spurring in the lower thoracic spine. Sternum intact.  Review of the MIP images confirms the  above findings.  ABDOMEN  Arterial findings:  Aorta: Incomplete opacification secondary to scan timing. Scattered atheromatous calcifications. Mild tortuosity. No aneurysm, dissection, or stenosis.  Celiac axis: Nonocclusive partially calcified ostial plaque, patent distally  Superior mesenteric: Nonocclusive partially calcified ostial plaque, patent distally  Left renal: Single, patent  Right renal: Single, with partially calcified ostial plaque, no convincing high-grade stenosis, patent distally.  Inferior mesenteric: Patent  Left iliac: Scattered calcified nonocclusive plaque through the common iliac and in the proximal internal iliac. External iliac is mildly ectatic, patent. Eccentric nonocclusive plaque in the common femoral artery noted.  Right iliac: Nonocclusive scattered calcified plaque through the common iliac, in the proximal internal iliac, and in the common  femoral artery. External iliac is widely patent.  Venous findings: Patent hepatic veins, portal vein, SMV, splenic vein, bilateral renal veins, IVC, and iliac venous system.  Review of the MIP images confirms the above findings.  Nonvascular findings: Innumerable hepatic cysts versus peliosis, stable. Unremarkable spleen, pancreas. Bilateral renal cysts, largest parapelvic 2.3 cm lower pole on the left, on the right 19 mm midpole. No hydronephrosis or solid renal lesion. Stomach , small bowel, colon nondilated. Normal appendix. Urinary bladder incompletely distended. Uterus and adnexal regions grossly unremarkable. No ascites. No free air. No adenopathy. Lumbar dextroscoliosis apex L1-2 with multilevel spondylitic changes and facet DJD.  IMPRESSION: 1. Dilated central pulmonary arteries suggesting pulmonary hypertension. 2. Atherosclerosis, including aortoiliac and coronary artery disease. Please note that although the presence of coronary artery calcium documents the  presence of coronary artery disease, the severity of this disease and any potential stenosis cannot be assessed on this non-gated CT examination. Assessment for potential risk factor modification, dietary therapy or pharmacologic therapy may be warranted, if clinically indicated. 3. Negative for acute PE or aortic aneurysm. 4. Little interval change in hepatic cysts versus peliosis.   Electronically Signed  By: Lucrezia Europe M.D.  On: 10/27/2015 09:48            Impression:  Patient has stage D severe symptomatic primary mitral regurgitation with history of recent onset persistent atrial fibrillation. She presents with symptoms of chronic diastolic congestive heart failure that became acutely worse this past August, presumably at the time the patient initially developed persistent atrial fibrillation. She has been doing much better recently since she underwent DC cardioversion. I have personally reviewed the patient's recent transthoracic and transesophageal echocardiograms and diagnostic cardiac catheterization. 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 There is severe mitral regurgitation. Left ventricular systolic function is preserved. The patient does not have significant coronary artery disease and pulmonary artery pressures are only mildly elevated. I agree the patient needs mitral valve repair or replacement and would likely benefit from concomitant Maze procedure. Although there remains a possibility her mitral valve could be repaired, the degree of extensive calcification significantly increases the likelihood that mitral valve replacement will be required. She may be a good candidate for minimally invasive approach for surgery.   Plan:  The patient was counseled at length regarding the indications, risks and potential  benefits of mitral valve repair. The rationale for elective surgery has been explained, including a comparison between surgery and continued medical therapy with close follow-up. The likelihood of successful and durable valve repair has been discussed with particular reference to the findings of their recent echocardiogram. Based upon these findings and previous experience, I have quoted them a less than 50 percent likelihood of successful valve repair. In the unlikely event that their valve cannot be successfully repaired, we discussed the possibility of replacing the mitral valve using a mechanical prosthesis with the attendant need for long-term anticoagulation versus the alternative of replacing it using a bioprosthetic tissue valve with its potential for late structural valve deterioration and failure, depending upon the patient's longevity. The patient specifically requests that if the mitral valve must be replaced that it be done using a bioprosthetic tissue valve. The patient understands and accepts all potential risks of surgery including but not limited to risk of death, stroke or other neurologic complication, myocardial infarction, congestive heart failure, respiratory failure, renal failure,  bleeding requiring transfusion and/or reexploration, arrhythmia, infection or other wound complications, pneumonia, pleural and/or pericardial effusion, pulmonary embolus, aortic dissection or other major vascular complication, or delayed complications related to valve repair or replacement including but not limited to structural valve deterioration and failure, thrombosis, embolization, endocarditis, or paravalvular leak. Alternative surgical approaches have been discussed including a comparison between conventional sternotomy and minimally-invasive techniques. The relative risks and benefits of each have been reviewed as they pertain to the patient's specific circumstances, and all of their questions have  been addressed. Specific risks potentially related to the minimally-invasive approach were discussed at length, including but not limited to risk of conversion to full or partial sternotomy, aortic dissection or other major vascular complication, unilateral acute lung injury or pulmonary edema, phrenic nerve dysfunction or paralysis, rib fracture, chronic pain, lung hernia, or lymphocele. All of her questions have been answered.  The patient was again counseled at length regarding indications, risks, and potential benefits of mitral valve repair or replacement and Maze procedure. We have reviewed the results of her recent CT angiogram and routine preoperative blood work. Alternative treatment strategies been discussed. All of her questions have been addressed. We plan to proceed with surgery tomorrow as originally scheduled.    Valentina Gu. Roxy Manns, MD 10/30/2015 2:52 PM

## 2015-10-31 ENCOUNTER — Inpatient Hospital Stay (HOSPITAL_COMMUNITY): Payer: Medicare Other

## 2015-10-31 ENCOUNTER — Encounter (HOSPITAL_COMMUNITY)
Admission: RE | Disposition: A | Discharge status: Skilled Nursing Facility | Payer: Medicare Other | Source: Ambulatory Visit | Attending: Thoracic Surgery (Cardiothoracic Vascular Surgery)

## 2015-10-31 ENCOUNTER — Inpatient Hospital Stay (HOSPITAL_COMMUNITY): Payer: Medicare Other | Admitting: Certified Registered Nurse Anesthetist

## 2015-10-31 ENCOUNTER — Inpatient Hospital Stay (HOSPITAL_COMMUNITY): Payer: Medicare Other | Admitting: Vascular Surgery

## 2015-10-31 ENCOUNTER — Encounter (HOSPITAL_COMMUNITY): Payer: Self-pay | Admitting: General Practice

## 2015-10-31 ENCOUNTER — Inpatient Hospital Stay (HOSPITAL_COMMUNITY)
Admission: RE | Admit: 2015-10-31 | Discharge: 2015-11-09 | DRG: 219 | Disposition: A | Discharge status: Skilled Nursing Facility | Payer: Medicare Other | Source: Ambulatory Visit | Attending: Thoracic Surgery (Cardiothoracic Vascular Surgery) | Admitting: Thoracic Surgery (Cardiothoracic Vascular Surgery)

## 2015-10-31 DIAGNOSIS — Z952 Presence of prosthetic heart valve: Secondary | ICD-10-CM

## 2015-10-31 DIAGNOSIS — R911 Solitary pulmonary nodule: Secondary | ICD-10-CM | POA: Diagnosis present

## 2015-10-31 DIAGNOSIS — I340 Nonrheumatic mitral (valve) insufficiency: Principal | ICD-10-CM

## 2015-10-31 DIAGNOSIS — Z7901 Long term (current) use of anticoagulants: Secondary | ICD-10-CM | POA: Diagnosis not present

## 2015-10-31 DIAGNOSIS — I5033 Acute on chronic diastolic (congestive) heart failure: Secondary | ICD-10-CM | POA: Diagnosis not present

## 2015-10-31 DIAGNOSIS — Z953 Presence of xenogenic heart valve: Secondary | ICD-10-CM | POA: Diagnosis not present

## 2015-10-31 DIAGNOSIS — K589 Irritable bowel syndrome without diarrhea: Secondary | ICD-10-CM | POA: Diagnosis present

## 2015-10-31 DIAGNOSIS — D6959 Other secondary thrombocytopenia: Secondary | ICD-10-CM | POA: Diagnosis not present

## 2015-10-31 DIAGNOSIS — I442 Atrioventricular block, complete: Secondary | ICD-10-CM | POA: Diagnosis not present

## 2015-10-31 DIAGNOSIS — I272 Other secondary pulmonary hypertension: Secondary | ICD-10-CM | POA: Diagnosis present

## 2015-10-31 DIAGNOSIS — Z950 Presence of cardiac pacemaker: Secondary | ICD-10-CM

## 2015-10-31 DIAGNOSIS — Z9889 Other specified postprocedural states: Secondary | ICD-10-CM

## 2015-10-31 DIAGNOSIS — I4510 Unspecified right bundle-branch block: Secondary | ICD-10-CM | POA: Diagnosis present

## 2015-10-31 DIAGNOSIS — I4891 Unspecified atrial fibrillation: Secondary | ICD-10-CM

## 2015-10-31 DIAGNOSIS — Z77090 Contact with and (suspected) exposure to asbestos: Secondary | ICD-10-CM | POA: Diagnosis present

## 2015-10-31 DIAGNOSIS — D62 Acute posthemorrhagic anemia: Secondary | ICD-10-CM | POA: Diagnosis not present

## 2015-10-31 DIAGNOSIS — I471 Supraventricular tachycardia: Secondary | ICD-10-CM | POA: Diagnosis not present

## 2015-10-31 DIAGNOSIS — Z8249 Family history of ischemic heart disease and other diseases of the circulatory system: Secondary | ICD-10-CM | POA: Diagnosis not present

## 2015-10-31 DIAGNOSIS — I4819 Other persistent atrial fibrillation: Secondary | ICD-10-CM | POA: Diagnosis present

## 2015-10-31 DIAGNOSIS — Z888 Allergy status to other drugs, medicaments and biological substances status: Secondary | ICD-10-CM

## 2015-10-31 DIAGNOSIS — Z823 Family history of stroke: Secondary | ICD-10-CM | POA: Diagnosis not present

## 2015-10-31 DIAGNOSIS — Z8679 Personal history of other diseases of the circulatory system: Secondary | ICD-10-CM

## 2015-10-31 DIAGNOSIS — Z79899 Other long term (current) drug therapy: Secondary | ICD-10-CM | POA: Diagnosis not present

## 2015-10-31 DIAGNOSIS — E785 Hyperlipidemia, unspecified: Secondary | ICD-10-CM | POA: Diagnosis present

## 2015-10-31 DIAGNOSIS — J9811 Atelectasis: Secondary | ICD-10-CM

## 2015-10-31 DIAGNOSIS — J984 Other disorders of lung: Secondary | ICD-10-CM

## 2015-10-31 DIAGNOSIS — F418 Other specified anxiety disorders: Secondary | ICD-10-CM | POA: Diagnosis present

## 2015-10-31 DIAGNOSIS — D693 Immune thrombocytopenic purpura: Secondary | ICD-10-CM | POA: Diagnosis present

## 2015-10-31 DIAGNOSIS — G43709 Chronic migraine without aura, not intractable, without status migrainosus: Secondary | ICD-10-CM | POA: Diagnosis present

## 2015-10-31 DIAGNOSIS — I495 Sick sinus syndrome: Secondary | ICD-10-CM | POA: Diagnosis not present

## 2015-10-31 DIAGNOSIS — D696 Thrombocytopenia, unspecified: Secondary | ICD-10-CM | POA: Diagnosis present

## 2015-10-31 DIAGNOSIS — I480 Paroxysmal atrial fibrillation: Secondary | ICD-10-CM

## 2015-10-31 DIAGNOSIS — I341 Nonrheumatic mitral (valve) prolapse: Secondary | ICD-10-CM | POA: Diagnosis present

## 2015-10-31 DIAGNOSIS — I481 Persistent atrial fibrillation: Secondary | ICD-10-CM | POA: Diagnosis present

## 2015-10-31 DIAGNOSIS — Z885 Allergy status to narcotic agent status: Secondary | ICD-10-CM | POA: Diagnosis not present

## 2015-10-31 DIAGNOSIS — I700 Atherosclerosis of aorta: Secondary | ICD-10-CM | POA: Diagnosis present

## 2015-10-31 HISTORY — DX: Presence of xenogenic heart valve: Z95.3

## 2015-10-31 HISTORY — DX: Personal history of other diseases of the circulatory system: Z86.79

## 2015-10-31 HISTORY — PX: TEE WITHOUT CARDIOVERSION: SHX5443

## 2015-10-31 HISTORY — DX: Other specified postprocedural states: Z98.890

## 2015-10-31 HISTORY — PX: MITRAL VALVE REPLACEMENT: SHX147

## 2015-10-31 HISTORY — PX: MINIMALLY INVASIVE MAZE PROCEDURE: SHX6244

## 2015-10-31 HISTORY — DX: Personal history of other diseases of the circulatory system: Z98.890

## 2015-10-31 HISTORY — DX: Bradycardia, unspecified: R00.1

## 2015-10-31 LAB — POCT I-STAT 3, ART BLOOD GAS (G3+)
Acid-base deficit: 3 mmol/L — ABNORMAL HIGH (ref 0.0–2.0)
Acid-base deficit: 8 mmol/L — ABNORMAL HIGH (ref 0.0–2.0)
Bicarbonate: 24.4 mEq/L — ABNORMAL HIGH (ref 20.0–24.0)
Bicarbonate: 21.2 mEq/L (ref 20.0–24.0)
Bicarbonate: 18.4 mEq/L — ABNORMAL LOW (ref 20.0–24.0)
O2 Saturation: 100 %
O2 Saturation: 92 %
O2 Saturation: 93 %
Patient temperature: 36.5
TCO2: 26 mmol/L (ref 0–100)
TCO2: 22 mmol/L (ref 0–100)
TCO2: 20 mmol/L (ref 0–100)
pCO2 arterial: 38.2 mmHg (ref 35.0–45.0)
pCO2 arterial: 34.4 mmHg — ABNORMAL LOW (ref 35.0–45.0)
pCO2 arterial: 39.1 mmHg (ref 35.0–45.0)
pH, Arterial: 7.413 (ref 7.350–7.450)
pH, Arterial: 7.398 (ref 7.350–7.450)
pH, Arterial: 7.278 — ABNORMAL LOW (ref 7.350–7.450)
pO2, Arterial: 344 mmHg — ABNORMAL HIGH (ref 80.0–100.0)
pO2, Arterial: 63 mmHg — ABNORMAL LOW (ref 80.0–100.0)
pO2, Arterial: 73 mmHg — ABNORMAL LOW (ref 80.0–100.0)

## 2015-10-31 LAB — POCT I-STAT, CHEM 8
BUN: 28 mg/dL — ABNORMAL HIGH (ref 6–20)
BUN: 22 mg/dL — ABNORMAL HIGH (ref 6–20)
BUN: 25 mg/dL — ABNORMAL HIGH (ref 6–20)
BUN: 23 mg/dL — ABNORMAL HIGH (ref 6–20)
BUN: 23 mg/dL — ABNORMAL HIGH (ref 6–20)
BUN: 22 mg/dL — ABNORMAL HIGH (ref 6–20)
BUN: 18 mg/dL (ref 6–20)
Calcium, Ion: 1.19 mmol/L (ref 1.13–1.30)
Calcium, Ion: 1.01 mmol/L — ABNORMAL LOW (ref 1.13–1.30)
Calcium, Ion: 1.21 mmol/L (ref 1.13–1.30)
Calcium, Ion: 1.08 mmol/L — ABNORMAL LOW (ref 1.13–1.30)
Calcium, Ion: 1.1 mmol/L — ABNORMAL LOW (ref 1.13–1.30)
Calcium, Ion: 1.04 mmol/L — ABNORMAL LOW (ref 1.13–1.30)
Calcium, Ion: 1.15 mmol/L (ref 1.13–1.30)
Chloride: 103 mmol/L (ref 101–111)
Chloride: 103 mmol/L (ref 101–111)
Chloride: 98 mmol/L — ABNORMAL LOW (ref 101–111)
Chloride: 102 mmol/L (ref 101–111)
Chloride: 102 mmol/L (ref 101–111)
Chloride: 101 mmol/L (ref 101–111)
Chloride: 108 mmol/L (ref 101–111)
Creatinine, Ser: 0.7 mg/dL (ref 0.44–1.00)
Creatinine, Ser: 0.6 mg/dL (ref 0.44–1.00)
Creatinine, Ser: 0.8 mg/dL (ref 0.44–1.00)
Creatinine, Ser: 0.6 mg/dL (ref 0.44–1.00)
Creatinine, Ser: 0.7 mg/dL (ref 0.44–1.00)
Creatinine, Ser: 0.7 mg/dL (ref 0.44–1.00)
Creatinine, Ser: 0.7 mg/dL (ref 0.44–1.00)
Glucose, Bld: 120 mg/dL — ABNORMAL HIGH (ref 65–99)
Glucose, Bld: 76 mg/dL (ref 65–99)
Glucose, Bld: 94 mg/dL (ref 65–99)
Glucose, Bld: 104 mg/dL — ABNORMAL HIGH (ref 65–99)
Glucose, Bld: 130 mg/dL — ABNORMAL HIGH (ref 65–99)
Glucose, Bld: 162 mg/dL — ABNORMAL HIGH (ref 65–99)
Glucose, Bld: 198 mg/dL — ABNORMAL HIGH (ref 65–99)
HCT: 30 % — ABNORMAL LOW (ref 36.0–46.0)
HCT: 23 % — ABNORMAL LOW (ref 36.0–46.0)
HCT: 30 % — ABNORMAL LOW (ref 36.0–46.0)
HCT: 23 % — ABNORMAL LOW (ref 36.0–46.0)
HCT: 22 % — ABNORMAL LOW (ref 36.0–46.0)
HCT: 23 % — ABNORMAL LOW (ref 36.0–46.0)
HCT: 28 % — ABNORMAL LOW (ref 36.0–46.0)
Hemoglobin: 10.2 g/dL — ABNORMAL LOW (ref 12.0–15.0)
Hemoglobin: 10.2 g/dL — ABNORMAL LOW (ref 12.0–15.0)
Hemoglobin: 7.8 g/dL — ABNORMAL LOW (ref 12.0–15.0)
Hemoglobin: 7.8 g/dL — ABNORMAL LOW (ref 12.0–15.0)
Hemoglobin: 7.5 g/dL — ABNORMAL LOW (ref 12.0–15.0)
Hemoglobin: 7.8 g/dL — ABNORMAL LOW (ref 12.0–15.0)
Hemoglobin: 9.5 g/dL — ABNORMAL LOW (ref 12.0–15.0)
Potassium: 3.7 mmol/L (ref 3.5–5.1)
Potassium: 3.6 mmol/L (ref 3.5–5.1)
Potassium: 3.6 mmol/L (ref 3.5–5.1)
Potassium: 4.4 mmol/L (ref 3.5–5.1)
Potassium: 4.4 mmol/L (ref 3.5–5.1)
Potassium: 3.9 mmol/L (ref 3.5–5.1)
Potassium: 4.3 mmol/L (ref 3.5–5.1)
Sodium: 138 mmol/L (ref 135–145)
Sodium: 136 mmol/L (ref 135–145)
Sodium: 139 mmol/L (ref 135–145)
Sodium: 138 mmol/L (ref 135–145)
Sodium: 136 mmol/L (ref 135–145)
Sodium: 137 mmol/L (ref 135–145)
Sodium: 137 mmol/L (ref 135–145)
TCO2: 26 mmol/L (ref 0–100)
TCO2: 24 mmol/L (ref 0–100)
TCO2: 24 mmol/L (ref 0–100)
TCO2: 25 mmol/L (ref 0–100)
TCO2: 24 mmol/L (ref 0–100)
TCO2: 20 mmol/L (ref 0–100)
TCO2: 18 mmol/L (ref 0–100)

## 2015-10-31 LAB — CBC
HCT: 32 % — ABNORMAL LOW (ref 36.0–46.0)
HCT: 29.9 % — ABNORMAL LOW (ref 36.0–46.0)
Hemoglobin: 10.9 g/dL — ABNORMAL LOW (ref 12.0–15.0)
Hemoglobin: 9.9 g/dL — ABNORMAL LOW (ref 12.0–15.0)
MCH: 30.6 pg (ref 26.0–34.0)
MCH: 29.9 pg (ref 26.0–34.0)
MCHC: 34.1 g/dL (ref 30.0–36.0)
MCHC: 33.1 g/dL (ref 30.0–36.0)
MCV: 89.9 fL (ref 78.0–100.0)
MCV: 90.3 fL (ref 78.0–100.0)
Platelets: 55 10*3/uL — ABNORMAL LOW (ref 150–400)
Platelets: 62 10*3/uL — ABNORMAL LOW (ref 150–400)
RBC: 3.56 MIL/uL — ABNORMAL LOW (ref 3.87–5.11)
RBC: 3.31 MIL/uL — ABNORMAL LOW (ref 3.87–5.11)
RDW: 14.3 % (ref 11.5–15.5)
RDW: 14.4 % (ref 11.5–15.5)
WBC: 11.9 10*3/uL — ABNORMAL HIGH (ref 4.0–10.5)
WBC: 11.4 10*3/uL — ABNORMAL HIGH (ref 4.0–10.5)

## 2015-10-31 LAB — HEMOGLOBIN AND HEMATOCRIT, BLOOD
HCT: 23 % — ABNORMAL LOW (ref 36.0–46.0)
Hemoglobin: 7.7 g/dL — ABNORMAL LOW (ref 12.0–15.0)

## 2015-10-31 LAB — APTT: aPTT: 37 seconds (ref 24–37)

## 2015-10-31 LAB — CREATININE, SERUM
Creatinine, Ser: 0.86 mg/dL (ref 0.44–1.00)
GFR calc Af Amer: >60 mL/min (ref >60)
GFR calc non Af Amer: >60 mL/min (ref >60)

## 2015-10-31 LAB — MAGNESIUM: Magnesium: 3.2 mg/dL — ABNORMAL HIGH (ref 1.7–2.4)

## 2015-10-31 LAB — PROTIME-INR
INR: 1.84 — ABNORMAL HIGH (ref 0.00–1.49)
Prothrombin Time: 21.2 seconds — ABNORMAL HIGH (ref 11.6–15.2)

## 2015-10-31 LAB — PLATELET COUNT: Platelets: 75 10*3/uL — ABNORMAL LOW (ref 150–400)

## 2015-10-31 SURGERY — MAZE PROCEDURE, CARDIAC, MINIMALLY INVASIVE
Anesthesia: General | Site: Chest | Laterality: Right

## 2015-10-31 MED ORDER — NITROGLYCERIN IN D5W 200-5 MCG/ML-% IV SOLN: 0.0000 ug/min | INTRAVENOUS | Status: DC

## 2015-10-31 MED ORDER — SODIUM CHLORIDE 0.45 % IV SOLN
INTRAVENOUS | Status: DC | PRN
  Administered 2015-10-31: 16:00:00 via INTRAVENOUS

## 2015-10-31 MED ORDER — SODIUM CHLORIDE 0.9 % IJ SOLN: 3.0000 mL | INTRAMUSCULAR | Status: DC | PRN

## 2015-10-31 MED ORDER — ONDANSETRON HCL 4 MG/2ML IJ SOLN
4.0000 mg | Freq: Four times a day (QID) | INTRAMUSCULAR | Status: DC | PRN
  Administered 2015-10-31 – 2015-11-06 (×8): 4 mg via INTRAVENOUS
  Filled 2015-10-31 (×8): qty 2

## 2015-10-31 MED ORDER — CEFAZOLIN SODIUM-DEXTROSE 2-3 GM-% IV SOLR
INTRAVENOUS | Status: AC
  Filled 2015-10-31: qty 100

## 2015-10-31 MED ORDER — ALBUMIN HUMAN 5 % IV SOLN
250.0000 mL | INTRAVENOUS | Status: AC | PRN
  Administered 2015-10-31 – 2015-11-01 (×4): 250 mL via INTRAVENOUS
  Administered 2015-11-01: 12.5 g via INTRAVENOUS
  Filled 2015-10-31 (×2): qty 250

## 2015-10-31 MED ORDER — PROTAMINE SULFATE 10 MG/ML IV SOLN
INTRAVENOUS | Status: AC
  Filled 2015-10-31: qty 25

## 2015-10-31 MED ORDER — PHENYLEPHRINE 40 MCG/ML (10ML) SYRINGE FOR IV PUSH (FOR BLOOD PRESSURE SUPPORT)
PREFILLED_SYRINGE | INTRAVENOUS | Status: AC
  Filled 2015-10-31: qty 10

## 2015-10-31 MED ORDER — SODIUM CHLORIDE 0.9 % IJ SOLN
3.0000 mL | Freq: Two times a day (BID) | INTRAMUSCULAR | Status: DC
  Administered 2015-11-01 – 2015-11-06 (×7): 3 mL via INTRAVENOUS

## 2015-10-31 MED ORDER — CHLORHEXIDINE GLUCONATE 0.12 % MT SOLN
15.0000 mL | Freq: Two times a day (BID) | OROMUCOSAL | Status: DC
  Administered 2015-10-31 – 2015-11-09 (×18): 15 mL via OROMUCOSAL
  Filled 2015-10-31 (×11): qty 15

## 2015-10-31 MED ORDER — ACETAMINOPHEN 500 MG PO TABS
1000.0000 mg | ORAL_TABLET | Freq: Four times a day (QID) | ORAL | Status: AC
  Administered 2015-11-01 – 2015-11-05 (×12): 1000 mg via ORAL
  Filled 2015-10-31 (×11): qty 2

## 2015-10-31 MED ORDER — LACTATED RINGERS IV SOLN
INTRAVENOUS | Status: DC | PRN
  Administered 2015-10-31: 07:00:00 via INTRAVENOUS

## 2015-10-31 MED ORDER — PHENYLEPHRINE HCL 10 MG/ML IJ SOLN
10.0000 mg | INTRAVENOUS | Status: DC | PRN
  Administered 2015-10-31: 50 ug/min via INTRAVENOUS

## 2015-10-31 MED ORDER — BISACODYL 10 MG RE SUPP: 10.0000 mg | Freq: Every day | RECTAL | Status: DC

## 2015-10-31 MED ORDER — DEXTROSE 5 % IV SOLN
1.5000 g | Freq: Two times a day (BID) | INTRAVENOUS | Status: AC
  Administered 2015-10-31 – 2015-11-02 (×4): 1.5 g via INTRAVENOUS
  Filled 2015-10-31 (×4): qty 1.5

## 2015-10-31 MED ORDER — EPHEDRINE SULFATE 50 MG/ML IJ SOLN
INTRAMUSCULAR | Status: DC | PRN
  Administered 2015-10-31: 5 mg via INTRAVENOUS

## 2015-10-31 MED ORDER — METOPROLOL TARTRATE 25 MG/10 ML ORAL SUSPENSION: 12.5000 mg | Freq: Two times a day (BID) | ORAL | Status: DC

## 2015-10-31 MED ORDER — SODIUM CHLORIDE 0.9 % IV SOLN
INTRAVENOUS | Status: DC
  Administered 2015-10-31: 0.9 [IU]/h via INTRAVENOUS
  Filled 2015-10-31: qty 2.5

## 2015-10-31 MED ORDER — GLYCOPYRROLATE 0.2 MG/ML IJ SOLN
INTRAMUSCULAR | Status: DC | PRN
  Administered 2015-10-31: 0.2 mg via INTRAVENOUS

## 2015-10-31 MED ORDER — VECURONIUM BROMIDE 10 MG IV SOLR
INTRAVENOUS | Status: AC
  Filled 2015-10-31: qty 10

## 2015-10-31 MED ORDER — INSULIN ASPART 100 UNIT/ML ~~LOC~~ SOLN
0.0000 [IU] | SUBCUTANEOUS | Status: DC
  Administered 2015-10-31 – 2015-11-02 (×7): 2 [IU] via SUBCUTANEOUS

## 2015-10-31 MED ORDER — HEMOSTATIC AGENTS (NO CHARGE) OPTIME
TOPICAL | Status: DC | PRN
  Administered 2015-10-31: 1 via TOPICAL

## 2015-10-31 MED ORDER — PROPOFOL 10 MG/ML IV BOLUS
INTRAVENOUS | Status: AC
  Filled 2015-10-31: qty 20

## 2015-10-31 MED ORDER — LACTATED RINGERS IV SOLN
INTRAVENOUS | Status: DC
  Administered 2015-10-31: 16:00:00 via INTRAVENOUS

## 2015-10-31 MED ORDER — PHENYLEPHRINE HCL 10 MG/ML IJ SOLN
0.0000 ug/min | INTRAVENOUS | Status: DC
  Administered 2015-10-31: 30 ug/min via INTRAVENOUS
  Administered 2015-10-31: 60 ug/min via INTRAVENOUS
  Administered 2015-11-01: 30 ug/min via INTRAVENOUS
  Filled 2015-10-31 (×5): qty 2

## 2015-10-31 MED ORDER — METOPROLOL TARTRATE 1 MG/ML IV SOLN: 2.5000 mg | INTRAVENOUS | Status: DC | PRN

## 2015-10-31 MED ORDER — ACETAMINOPHEN 160 MG/5ML PO SOLN
1000.0000 mg | Freq: Four times a day (QID) | ORAL | Status: DC
  Administered 2015-11-01: 1000 mg
  Filled 2015-10-31: qty 40.6

## 2015-10-31 MED ORDER — MIDAZOLAM HCL 2 MG/2ML IJ SOLN: 2.0000 mg | INTRAMUSCULAR | Status: DC | PRN

## 2015-10-31 MED ORDER — ROCURONIUM BROMIDE 100 MG/10ML IV SOLN
INTRAVENOUS | Status: DC | PRN
  Administered 2015-10-31: 50 mg via INTRAVENOUS

## 2015-10-31 MED ORDER — ASPIRIN EC 325 MG PO TBEC
325.0000 mg | DELAYED_RELEASE_TABLET | Freq: Every day | ORAL | Status: DC
  Administered 2015-11-01: 325 mg via ORAL
  Filled 2015-10-31: qty 1

## 2015-10-31 MED ORDER — SODIUM CHLORIDE 0.9 % IV SOLN
250.0000 mL | INTRAVENOUS | Status: DC
  Administered 2015-11-01: 250 mL via INTRAVENOUS

## 2015-10-31 MED ORDER — SODIUM BICARBONATE 8.4 % IV SOLN
50.0000 meq | Freq: Once | INTRAVENOUS | Status: AC
  Administered 2015-10-31 – 2015-11-01 (×2): 50 meq via INTRAVENOUS

## 2015-10-31 MED ORDER — CHLORHEXIDINE GLUCONATE 0.12 % MT SOLN
15.0000 mL | OROMUCOSAL | Status: AC
  Administered 2015-10-31: 15 mL via OROMUCOSAL
  Filled 2015-10-31: qty 15

## 2015-10-31 MED ORDER — VANCOMYCIN HCL 1000 MG IV SOLR
INTRAVENOUS | Status: DC | PRN
  Administered 2015-10-31: 1000 mL

## 2015-10-31 MED ORDER — DEXMEDETOMIDINE HCL IN NACL 200 MCG/50ML IV SOLN: 0.0000 ug/kg/h | INTRAVENOUS | Status: DC

## 2015-10-31 MED ORDER — ACETAMINOPHEN 650 MG RE SUPP
650.0000 mg | Freq: Once | RECTAL | Status: AC
  Administered 2015-10-31: 650 mg via RECTAL

## 2015-10-31 MED ORDER — HEPARIN SODIUM (PORCINE) 1000 UNIT/ML IJ SOLN
INTRAMUSCULAR | Status: DC | PRN
  Administered 2015-10-31: 19000 [IU] via INTRAVENOUS

## 2015-10-31 MED ORDER — MIDAZOLAM HCL 5 MG/5ML IJ SOLN
INTRAMUSCULAR | Status: DC | PRN
  Administered 2015-10-31: 1 mg via INTRAVENOUS
  Administered 2015-10-31: 2 mg via INTRAVENOUS
  Administered 2015-10-31: 4 mg via INTRAVENOUS
  Administered 2015-10-31: 3 mg via INTRAVENOUS

## 2015-10-31 MED ORDER — POTASSIUM CHLORIDE 10 MEQ/50ML IV SOLN
10.0000 meq | INTRAVENOUS | Status: AC
  Administered 2015-10-31 (×3): 10 meq via INTRAVENOUS

## 2015-10-31 MED ORDER — FENTANYL CITRATE (PF) 250 MCG/5ML IJ SOLN
INTRAMUSCULAR | Status: AC
  Filled 2015-10-31: qty 20

## 2015-10-31 MED ORDER — FAMOTIDINE IN NACL 20-0.9 MG/50ML-% IV SOLN
20.0000 mg | Freq: Two times a day (BID) | INTRAVENOUS | Status: AC
  Administered 2015-10-31 – 2015-11-01 (×2): 20 mg via INTRAVENOUS
  Filled 2015-10-31: qty 50

## 2015-10-31 MED ORDER — MAGNESIUM SULFATE 4 GM/100ML IV SOLN
4.0000 g | Freq: Once | INTRAVENOUS | Status: AC
  Administered 2015-10-31: 4 g via INTRAVENOUS
  Filled 2015-10-31: qty 100

## 2015-10-31 MED ORDER — ROCURONIUM BROMIDE 50 MG/5ML IV SOLN
INTRAVENOUS | Status: AC
  Filled 2015-10-31: qty 1

## 2015-10-31 MED ORDER — HEPARIN SODIUM (PORCINE) 1000 UNIT/ML IJ SOLN
INTRAMUSCULAR | Status: AC
  Filled 2015-10-31: qty 1

## 2015-10-31 MED ORDER — VANCOMYCIN HCL 1000 MG IV SOLR
INTRAVENOUS | Status: DC
  Filled 2015-10-31: qty 1000

## 2015-10-31 MED ORDER — CALCIUM CHLORIDE 10 % IV SOLN
INTRAVENOUS | Status: DC | PRN
  Administered 2015-10-31: 500 mg via INTRAVENOUS

## 2015-10-31 MED ORDER — 0.9 % SODIUM CHLORIDE (POUR BTL) OPTIME
TOPICAL | Status: DC | PRN
  Administered 2015-10-31: 5000 mL

## 2015-10-31 MED ORDER — FENTANYL CITRATE (PF) 250 MCG/5ML IJ SOLN
INTRAMUSCULAR | Status: AC
  Filled 2015-10-31: qty 5

## 2015-10-31 MED ORDER — DOCUSATE SODIUM 100 MG PO CAPS
200.0000 mg | ORAL_CAPSULE | Freq: Every day | ORAL | Status: DC
  Administered 2015-11-01 – 2015-11-07 (×6): 200 mg via ORAL
  Filled 2015-10-31 (×7): qty 2

## 2015-10-31 MED ORDER — MIDAZOLAM HCL 10 MG/2ML IJ SOLN
INTRAMUSCULAR | Status: AC
  Filled 2015-10-31: qty 2

## 2015-10-31 MED ORDER — PROPOFOL 10 MG/ML IV BOLUS
INTRAVENOUS | Status: DC | PRN
  Administered 2015-10-31: 80 mg via INTRAVENOUS

## 2015-10-31 MED ORDER — LACTATED RINGERS IV SOLN
INTRAVENOUS | Status: DC | PRN
  Administered 2015-10-31 (×2): via INTRAVENOUS

## 2015-10-31 MED ORDER — PANTOPRAZOLE SODIUM 40 MG PO TBEC
40.0000 mg | DELAYED_RELEASE_TABLET | Freq: Every day | ORAL | Status: DC
Start: 2015-11-02 — End: 2015-11-09
  Administered 2015-11-02 – 2015-11-09 (×7): 40 mg via ORAL
  Filled 2015-10-31 (×7): qty 1

## 2015-10-31 MED ORDER — INSULIN REGULAR BOLUS VIA INFUSION
0.0000 [IU] | Freq: Three times a day (TID) | INTRAVENOUS | Status: DC
  Filled 2015-10-31: qty 10

## 2015-10-31 MED ORDER — FENTANYL CITRATE (PF) 100 MCG/2ML IJ SOLN: 50.0000 ug | INTRAMUSCULAR | Status: DC | PRN

## 2015-10-31 MED ORDER — TRAMADOL HCL 50 MG PO TABS
50.0000 mg | ORAL_TABLET | ORAL | Status: DC | PRN
  Administered 2015-11-02 – 2015-11-07 (×7): 100 mg via ORAL
  Filled 2015-10-31 (×7): qty 2

## 2015-10-31 MED ORDER — ALBUMIN HUMAN 5 % IV SOLN
INTRAVENOUS | Status: DC | PRN
  Administered 2015-10-31 (×3): via INTRAVENOUS

## 2015-10-31 MED ORDER — SODIUM CHLORIDE 0.9 % IV SOLN
INTRAVENOUS | Status: DC
  Administered 2015-10-31: 16:00:00 via INTRAVENOUS

## 2015-10-31 MED ORDER — LACTATED RINGERS IV SOLN: 500.0000 mL | Freq: Once | INTRAVENOUS | Status: DC | PRN

## 2015-10-31 MED ORDER — ARTIFICIAL TEARS OP OINT
TOPICAL_OINTMENT | OPHTHALMIC | Status: DC | PRN
  Administered 2015-10-31: 1 via OPHTHALMIC

## 2015-10-31 MED ORDER — ASPIRIN 81 MG PO CHEW: 324.0000 mg | CHEWABLE_TABLET | Freq: Every day | ORAL | Status: DC

## 2015-10-31 MED ORDER — BISACODYL 5 MG PO TBEC
10.0000 mg | DELAYED_RELEASE_TABLET | Freq: Every day | ORAL | Status: DC
  Administered 2015-11-01 – 2015-11-08 (×6): 10 mg via ORAL
  Filled 2015-10-31 (×8): qty 2

## 2015-10-31 MED ORDER — SODIUM CHLORIDE 0.9 % IV SOLN: INTRAVENOUS | Status: DC

## 2015-10-31 MED ORDER — VANCOMYCIN HCL IN DEXTROSE 1-5 GM/200ML-% IV SOLN
1000.0000 mg | Freq: Once | INTRAVENOUS | Status: AC
  Administered 2015-10-31: 1000 mg via INTRAVENOUS
  Filled 2015-10-31: qty 200

## 2015-10-31 MED ORDER — DEXMEDETOMIDINE HCL IN NACL 200 MCG/50ML IV SOLN
0.4000 ug/kg/h | INTRAVENOUS | Status: DC
  Administered 2015-10-31: 0.7 ug/kg/h via INTRAVENOUS
  Administered 2015-10-31: 0.3 ug/kg/h via INTRAVENOUS
  Filled 2015-10-31: qty 50

## 2015-10-31 MED ORDER — VECURONIUM BROMIDE 10 MG IV SOLR
INTRAVENOUS | Status: DC | PRN
  Administered 2015-10-31 (×5): 5 mg via INTRAVENOUS

## 2015-10-31 MED ORDER — FENTANYL CITRATE (PF) 100 MCG/2ML IJ SOLN
INTRAMUSCULAR | Status: DC | PRN
Start: 2015-10-31 — End: 2015-10-31
  Administered 2015-10-31: 100 ug via INTRAVENOUS
  Administered 2015-10-31: 50 ug via INTRAVENOUS
  Administered 2015-10-31 (×2): 100 ug via INTRAVENOUS
  Administered 2015-10-31 (×3): 150 ug via INTRAVENOUS
  Administered 2015-10-31: 200 ug via INTRAVENOUS

## 2015-10-31 MED ORDER — PROTAMINE SULFATE 10 MG/ML IV SOLN
INTRAVENOUS | Status: DC | PRN
  Administered 2015-10-31: 240 mg via INTRAVENOUS

## 2015-10-31 MED ORDER — ACETAMINOPHEN 160 MG/5ML PO SOLN: 650.0000 mg | Freq: Once | ORAL | Status: AC

## 2015-10-31 MED ORDER — ANTISEPTIC ORAL RINSE SOLUTION (CORINZ)
7.0000 mL | Freq: Four times a day (QID) | OROMUCOSAL | Status: DC
  Administered 2015-11-01 – 2015-11-02 (×4): 7 mL via OROMUCOSAL

## 2015-10-31 MED ORDER — EPHEDRINE SULFATE 50 MG/ML IJ SOLN
INTRAMUSCULAR | Status: AC
  Filled 2015-10-31: qty 1

## 2015-10-31 MED ORDER — DOPAMINE-DEXTROSE 3.2-5 MG/ML-% IV SOLN
0.0000 ug/kg/min | INTRAVENOUS | Status: DC
  Administered 2015-10-31: 3 ug/kg/min via INTRAVENOUS
  Administered 2015-11-03 – 2015-11-05 (×3): 5 ug/kg/min via INTRAVENOUS
  Filled 2015-10-31 (×4): qty 250

## 2015-10-31 MED ORDER — METOPROLOL TARTRATE 12.5 MG HALF TABLET: 12.5000 mg | ORAL_TABLET | Freq: Two times a day (BID) | ORAL | Status: DC

## 2015-10-31 MED ORDER — LIDOCAINE HCL (CARDIAC) 20 MG/ML IV SOLN
INTRAVENOUS | Status: AC
  Filled 2015-10-31: qty 5

## 2015-10-31 MED FILL — Magnesium Sulfate Inj 50%: INTRAMUSCULAR | Qty: 10 | Status: AC

## 2015-10-31 MED FILL — Heparin Sodium (Porcine) Inj 1000 Unit/ML: INTRAMUSCULAR | Qty: 30 | Status: AC

## 2015-10-31 MED FILL — Potassium Chloride Inj 2 mEq/ML: INTRAVENOUS | Qty: 40 | Status: AC

## 2015-10-31 SURGICAL SUPPLY — 128 items
ADAPTER CARDIO PERF ANTE/RETRO (ADAPTER) ×4 IMPLANT
ARTICLIP LAA PROCLIP II 45 (Clip) ×4 IMPLANT
BAG DECANTER FOR FLEXI CONT (MISCELLANEOUS) ×8 IMPLANT
BLADE STERNUM SYSTEM 6 (BLADE) ×4 IMPLANT
BLADE SURG 11 STRL SS (BLADE) ×4 IMPLANT
CANISTER SUCTION 2500CC (MISCELLANEOUS) ×8 IMPLANT
CANNULA FEM VENOUS REMOTE 22FR (CANNULA) ×4 IMPLANT
CANNULA FEMORAL ART 14 SM (MISCELLANEOUS) ×4 IMPLANT
CANNULA GUNDRY RCSP 15FR (MISCELLANEOUS) ×4 IMPLANT
CANNULA OPTISITE PERFUSION 16F (CANNULA) IMPLANT
CANNULA OPTISITE PERFUSION 18F (CANNULA) ×4 IMPLANT
CANNULA SUMP PERICARDIAL (CANNULA) ×8 IMPLANT
CARDIOBLATE CARDIAC ABLATION (MISCELLANEOUS)
CATH KIT ON Q 5IN SLV (PAIN MANAGEMENT) IMPLANT
CELLS DAT CNTRL 66122 CELL SVR (MISCELLANEOUS) ×3 IMPLANT
CLAMP ISOLATOR SYNERGY LG (MISCELLANEOUS) ×4 IMPLANT
CONN ST 1/4X3/8  BEN (MISCELLANEOUS) ×4
CONN ST 1/4X3/8 BEN (MISCELLANEOUS) ×12 IMPLANT
CONNECTOR 1/2X3/8X1/2 3 WAY (MISCELLANEOUS) ×1
CONNECTOR 1/2X3/8X1/2 3WAY (MISCELLANEOUS) ×3 IMPLANT
CONT SPEC STER OR (MISCELLANEOUS) ×4 IMPLANT
COVER BACK TABLE 24X17X13 BIG (DRAPES) ×4 IMPLANT
COVER PROBE W GEL 5X96 (DRAPES) ×4 IMPLANT
CRADLE DONUT ADULT HEAD (MISCELLANEOUS) ×4 IMPLANT
DERMABOND ADVANCED (GAUZE/BANDAGES/DRESSINGS) ×2
DERMABOND ADVANCED .7 DNX12 (GAUZE/BANDAGES/DRESSINGS) ×6 IMPLANT
DEVICE ATRICLIP LAA PRCLPII 45 (Clip) ×3 IMPLANT
DEVICE CARDIOBLATE CARDIAC ABL (MISCELLANEOUS) IMPLANT
DEVICE PMI PUNCTURE CLOSURE (MISCELLANEOUS) ×4 IMPLANT
DEVICE SUT CK QUICK LOAD INDV (Prosthesis & Implant Heart) ×8 IMPLANT
DEVICE SUT CK QUICK LOAD MINI (Prosthesis & Implant Heart) ×8 IMPLANT
DEVICE TROCAR PUNCTURE CLOSURE (ENDOMECHANICALS) ×4 IMPLANT
DRAIN CHANNEL 28F RND 3/8 FF (WOUND CARE) ×8 IMPLANT
DRAPE BILATERAL SPLIT (DRAPES) ×4 IMPLANT
DRAPE C-ARM 42X72 X-RAY (DRAPES) ×4 IMPLANT
DRAPE CV SPLIT W-CLR ANES SCRN (DRAPES) ×4 IMPLANT
DRAPE INCISE IOBAN 66X45 STRL (DRAPES) ×12 IMPLANT
DRAPE SLUSH/WARMER DISC (DRAPES) ×4 IMPLANT
DRSG COVADERM 4X8 (GAUZE/BANDAGES/DRESSINGS) ×4 IMPLANT
ELECT BLADE 6.5 EXT (BLADE) ×4 IMPLANT
ELECT REM PT RETURN 9FT ADLT (ELECTROSURGICAL) ×8
ELECTRODE REM PT RTRN 9FT ADLT (ELECTROSURGICAL) ×6 IMPLANT
FEMORAL VENOUS CANN RAP (CANNULA) IMPLANT
GAUZE SPONGE 4X4 12PLY STRL (GAUZE/BANDAGES/DRESSINGS) ×4 IMPLANT
GLOVE BIO SURGEON STRL SZ 6 (GLOVE) ×24 IMPLANT
GLOVE BIO SURGEON STRL SZ 6.5 (GLOVE) ×12 IMPLANT
GLOVE ORTHO TXT STRL SZ7.5 (GLOVE) ×12 IMPLANT
GOWN STRL REUS W/ TWL LRG LVL3 (GOWN DISPOSABLE) ×12 IMPLANT
GOWN STRL REUS W/TWL LRG LVL3 (GOWN DISPOSABLE) ×4
IV NS 1000ML (IV SOLUTION) ×2
IV NS 1000ML BAXH (IV SOLUTION) ×6 IMPLANT
IV NS IRRIG 3000ML ARTHROMATIC (IV SOLUTION) ×4 IMPLANT
KIT BASIN OR (CUSTOM PROCEDURE TRAY) ×4 IMPLANT
KIT DILATOR VASC 18G NDL (KITS) ×4 IMPLANT
KIT DRAINAGE VACCUM ASSIST (KITS) ×4 IMPLANT
KIT ROOM TURNOVER OR (KITS) ×4 IMPLANT
KIT SUCTION CATH 14FR (SUCTIONS) ×8 IMPLANT
KIT SUT CK MINI COMBO 4X17 (Prosthesis & Implant Heart) ×4 IMPLANT
LEAD PACING MYOCARDI (MISCELLANEOUS) ×8 IMPLANT
LINE VENT (MISCELLANEOUS) ×4 IMPLANT
NEEDLE AORTIC ROOT 14G 7F (CATHETERS) ×4 IMPLANT
NS IRRIG 1000ML POUR BTL (IV SOLUTION) ×20 IMPLANT
PACK OPEN HEART (CUSTOM PROCEDURE TRAY) ×4 IMPLANT
PAD ARMBOARD 7.5X6 YLW CONV (MISCELLANEOUS) ×8 IMPLANT
PAD ELECT DEFIB RADIOL ZOLL (MISCELLANEOUS) ×4 IMPLANT
PATCH CORMATRIX 4CMX7CM (Prosthesis & Implant Heart) ×4 IMPLANT
PROBE CRYO2-ABLATION MALLABLE (MISCELLANEOUS) ×4 IMPLANT
RETRACTOR TRL SOFT TISSUE LG (INSTRUMENTS) IMPLANT
RETRACTOR TRM SOFT TISSUE 7.5 (INSTRUMENTS) IMPLANT
RTRCTR WOUND ALEXIS 18CM MED (MISCELLANEOUS) ×4
RUBBERBAND STERILE (MISCELLANEOUS) ×4 IMPLANT
SET CANNULATION TOURNIQUET (MISCELLANEOUS) ×4 IMPLANT
SET CARDIOPLEGIA MPS 5001102 (MISCELLANEOUS) ×4 IMPLANT
SET IRRIG TUBING LAPAROSCOPIC (IRRIGATION / IRRIGATOR) ×4 IMPLANT
SOLUTION ANTI FOG 6CC (MISCELLANEOUS) ×4 IMPLANT
SPONGE GAUZE 4X4 12PLY STER LF (GAUZE/BANDAGES/DRESSINGS) ×4 IMPLANT
SUT BONE WAX W31G (SUTURE) ×4 IMPLANT
SUT E-PACK MINIMALLY INVASIVE (SUTURE) IMPLANT
SUT ETHIBON 2 0 V 52N 30 (SUTURE) ×4 IMPLANT
SUT ETHIBOND (SUTURE) IMPLANT
SUT ETHIBOND 2 0 SH (SUTURE) ×12 IMPLANT
SUT ETHIBOND 2 0 V4 (SUTURE) IMPLANT
SUT ETHIBOND 2 0V4 GREEN (SUTURE) IMPLANT
SUT ETHIBOND 2-0 RB-1 WHT (SUTURE) IMPLANT
SUT ETHIBOND 4 0 TF (SUTURE) IMPLANT
SUT ETHIBOND 5 0 C 1 30 (SUTURE) IMPLANT
SUT ETHIBOND NAB MH 2-0 36IN (SUTURE) IMPLANT
SUT ETHIBOND X763 2 0 SH 1 (SUTURE) ×4 IMPLANT
SUT GORETEX 6.0 TH-9 30 IN (SUTURE) IMPLANT
SUT GORETEX CV 4 TH 22 36 (SUTURE) ×12 IMPLANT
SUT GORETEX CV-5THC-13 36IN (SUTURE) IMPLANT
SUT GORETEX CV4 TH-18 (SUTURE) ×12 IMPLANT
SUT GORETEX TH-18 36 INCH (SUTURE) ×4 IMPLANT
SUT MNCRL AB 3-0 PS2 18 (SUTURE) ×8 IMPLANT
SUT PROLENE 3 0 SH DA (SUTURE) ×24 IMPLANT
SUT PROLENE 3 0 SH1 36 (SUTURE) ×16 IMPLANT
SUT PROLENE 4 0 RB 1 (SUTURE) ×8
SUT PROLENE 4-0 RB1 .5 CRCL 36 (SUTURE) ×24 IMPLANT
SUT PROLENE 5 0 C 1 36 (SUTURE) ×8 IMPLANT
SUT PROLENE 6 0 C 1 24 (SUTURE) ×8 IMPLANT
SUT SILK  1 MH (SUTURE) ×7
SUT SILK 1 MH (SUTURE) ×21 IMPLANT
SUT SILK 2 0 SH CR/8 (SUTURE) ×4 IMPLANT
SUT SILK 2 0SH CR/8 30 (SUTURE) ×8 IMPLANT
SUT SILK 3 0 SH CR/8 (SUTURE) ×4 IMPLANT
SUT SILK 3 0SH CR/8 30 (SUTURE) ×4 IMPLANT
SUT TEM PAC WIRE 2 0 SH (SUTURE) ×8 IMPLANT
SUT VIC AB 2 TP1 27 (SUTURE) ×8 IMPLANT
SUT VIC AB 2-0 CTX 36 (SUTURE) ×8 IMPLANT
SUT VIC AB 3-0 SH 8-18 (SUTURE) ×16 IMPLANT
SUT VICRYL 0 UR6 27IN ABS (SUTURE) ×8 IMPLANT
SUT VICRYL 2 TP 1 (SUTURE) IMPLANT
SYRINGE 10CC LL (SYRINGE) ×4 IMPLANT
SYS ATRICLIP LAA EXCLUSION 45 (CLIP) ×4 IMPLANT
SYSTEM SAHARA CHEST DRAIN ATS (WOUND CARE) ×8 IMPLANT
TAPE CLOTH SURG 4X10 WHT LF (GAUZE/BANDAGES/DRESSINGS) ×4 IMPLANT
TAPE PAPER 2X10 WHT MICROPORE (GAUZE/BANDAGES/DRESSINGS) ×4 IMPLANT
TOWEL OR 17X24 6PK STRL BLUE (TOWEL DISPOSABLE) ×8 IMPLANT
TOWEL OR 17X26 10 PK STRL BLUE (TOWEL DISPOSABLE) ×8 IMPLANT
TRAY FOLEY IC TEMP SENS 16FR (CATHETERS) ×4 IMPLANT
TROCAR XCEL BLADELESS 5X75MML (TROCAR) ×4 IMPLANT
TROCAR XCEL NON-BLD 11X100MML (ENDOMECHANICALS) ×8 IMPLANT
TUBE SUCT INTRACARD DLP 20F (MISCELLANEOUS) ×4 IMPLANT
TUNNELER SHEATH ON-Q 11GX8 DSP (PAIN MANAGEMENT) IMPLANT
UNDERPAD 30X30 INCONTINENT (UNDERPADS AND DIAPERS) ×4 IMPLANT
VALVE MAGNA MITRAL 33MM (Prosthesis & Implant Heart) ×4 IMPLANT
WATER STERILE IRR 1000ML POUR (IV SOLUTION) ×8 IMPLANT
WIRE BENTSON .035X145CM (WIRE) ×4 IMPLANT

## 2015-10-31 NOTE — Progress Notes (Signed)
Pt is PS/CPAP at this time tolerating it well. Pt has Increase Respirations due to anxiety for a short episode, pt is alert and following commands. RN at bedside.

## 2015-10-31 NOTE — CV Procedure (Signed)
Intra-operative Transesophageal Echocardiography Report:  [name] [name] is a 74 year old female with long-standing history of mitral valve prolapse, paroxysmal SVT status post ablation, and recent onset of persistent atrial fibrillation. Cardiac catheterization on 10/18/15 revealed no significant coronary artery disease with mild pulmonary artery pressures and severe mitral regurgitation. She had previously undergone   TEE guided cardioversion on 08/22/2015. The TEE revealed bileaflet prolapse with calcified leaflets and moderate to severe mitral regurgitation. She has described symptoms of  exertional shortness of breath, and  episodes of resting shortness of breath while lying flat in bed.  The patient was brought to the operating room at [facility] and general anesthesia was induced without difficulty. Following endotracheal intubation and orogastric suctioning, the transesophageal echocardiography probe was inserted into the esophagus without difficulty.  Impression: Pre-bypass findings:  1. Aortic valve: Aortic valve was trileaflet. The leaflets were thin and pliable and opened normally without restriction. There was no aortic insufficiency.  2. Mitral valve: There was marked bileaflet redundancy with both leaflets billowing in the left atrium on systole but no obvious flail segments noted. There was moderate calcification of the anterior leaflet especially in the mid-and distal regions of the anterior leaflet. There was also calcification of the subvalvular apparatus involving the anterior and posterior leaflets. There was  central and posteriorly directed jets of mitral regurgitation which were graded as severe. There was a posterior wall hugging jet in left atrium.  3. Left ventricle: The left ventricular cavity was dilated and measured 5.53 cm at end diastole at the mid-papillary level in the transgastric short axis view. Left ventricular wall thickness was minimally increased. The  posterior wall measured 1.04 cm in the anterior wall measured 1.05 cm at end diastole at the mid-papillary level in the transgastric short axis view. There was vigorous contractility in all LV segments interrogated and the ejection fraction was estimated at 65%.  4. Right ventricle: Right ventricular function appeared normal. There was TAPSE which measured 2.6 cm. The S' measured 11.5 cm/sec. The basilar segments of the right ventricle contracted normally however there was some reduced contractility of the infundibular region of the right ventricle. Right ventricular size was normal. There was no septal flattening.  5. Tricuspid valve: There was trace tricuspid insufficiency. The leaflets appeared structurally normal. The Tricuspid annulus was dilated and measured 4.6 cm in the 4 chamber view and 4.8 cm in the RV inflow outflow view.  6. Interatrial septum: Interatrial septum was intact without evidence of patent foramen ovale or atrial septal defect by color Doppler and bubble study.  7. The left atrium: The left atrium was enlarged and measured 5.8 cm in the mediolateral dimension in the deep transgastric view. There was no thrombus noted in the left atrium or left atrial appendage.  8. Ascending aorta: There was a well-defined aortic root and sinotubular ridge without significant atheromatous disease, effacement, or enlargement of the aortic root.  9. Descending aorta: The descending aorta was free of atheromatous disease and was of normal diameter.  Post-bypass findings:  1. Aortic valve: The aortic valve was unchanged in the pre-bypass study. The leaflets opened normally and there was no aortic insufficiency. No turbulence was noted in the aortic outflow tract.  2. Mitral valve: There was a bioprosthetic valve in the mitral position. The prosthesis was well-seated. There was a trace 1+ central jet of  mitral insufficiency. No perivalvular insufficiency noted. Continuous-wave Doppler  interrogation of the mitral inflow revealed a mean transmitral gradient of 2 mmHg and a  mitral valve area of 2.4 cm2  using the pressure half-time method.  3. Left ventricle: There was  was dyssynchronous contractility of the left ventricle due to ventricular pacing. The LV ejection fraction was estimated at 50%.  4. Right ventricle: The right ventricular function appeared reduced from the pre-bypass study. There was reduced contractility of the right ventricular free wall and reduced lateral tricuspid annular systolic excursion. There was no septal flattening.  5. Tricuspid valve:  There was trace Tricuspid insufficiency which was unchanged from the pre-bypass exam.  Roberts Gaudy

## 2015-10-31 NOTE — Anesthesia Postprocedure Evaluation (Signed)
Anesthesia Post Note  Patient: [name] [name]  Procedure(s) Performed: Procedure(s) (LRB): MINIMALLY INVASIVE MAZE PROCEDURE (N/A) TRANSESOPHAGEAL ECHOCARDIOGRAM (TEE) (N/A) MINIMALLY INVASIVE MITRAL VALVE (MV) REPLACEMENT (Right)  Patient location during evaluation: SICU Anesthesia Type: General Level of consciousness: sedated and patient remains intubated per anesthesia plan Pain management: pain level controlled Vital Signs Assessment: post-procedure vital signs reviewed and stable Respiratory status: patient on ventilator - see flowsheet for VS Cardiovascular status: blood pressure returned to baseline and stable Anesthetic complications: no    Last Vitals:  Filed Vitals:   10/31/15 1745 10/31/15 1800  BP:  105/74  Pulse: 81 80  Temp: 36.4 C 36.4 C  Resp: 24 12    Last Pain: There were no vitals filed for this visit.               Navdeep Fessenden COKER

## 2015-10-31 NOTE — OR Nursing (Signed)
13:50 - Off pump call to SICU, 14:25 - 45 minute call to SICU, 14:45 - 20 minute call to SICU

## 2015-10-31 NOTE — Progress Notes (Signed)
Pt weaning is initiated. Pt is tolerating it well. RN at bedside. No complications or distress noted.

## 2015-10-31 NOTE — Transfer of Care (Signed)
Immediate Anesthesia Transfer of Care Note  Patient: [name] [name]  Procedure(s) Performed: Procedure(s): MINIMALLY INVASIVE MAZE PROCEDURE (N/A) TRANSESOPHAGEAL ECHOCARDIOGRAM (TEE) (N/A) MINIMALLY INVASIVE MITRAL VALVE (MV) REPLACEMENT (Right)  Patient Location: SICU  Anesthesia Type:General  Level of Consciousness: Patient remains intubated per anesthesia plan  Airway & Oxygen Therapy: Patient remains intubated per anesthesia plan  Post-op Assessment: Report given to RN and Post -op Vital signs reviewed and stable  Post vital signs: Reviewed and stable  Last Vitals:  Filed Vitals:   10/31/15 0553  BP: 115/59  Pulse: 72  Temp: 36.4 C  Resp: 16    Complications: No apparent anesthesia complications

## 2015-10-31 NOTE — Progress Notes (Signed)
CT surgery p.m. Rounds  Sedated on ventilator FiO2 50% AV sequentially paced for severe bradycardia Low-dose dopamine started for cardiac index 1.5 Chest tube drainage and urine output satisfactory

## 2015-10-31 NOTE — Interval H&P Note (Signed)
History and Physical Interval Note:  10/31/2015 6:57 AM  [name] [name]  has presented today for surgery, with the diagnosis of MR AFIB  The various methods of treatment have been discussed with the patient and family. After consideration of risks, benefits and other options for treatment, the patient has consented to  Procedure(s): MINIMALLY INVASIVE MITRAL VALVE REPAIR OR REPLACEMENT(MVR) (Right) MINIMALLY INVASIVE MAZE PROCEDURE (N/A) TRANSESOPHAGEAL ECHOCARDIOGRAM (TEE) (N/A) as a surgical intervention .  The patient's history has been reviewed, patient examined, no change in status, stable for surgery.  I have reviewed the patient's chart and labs.  Questions were answered to the patient's satisfaction.     Rexene Alberts

## 2015-10-31 NOTE — Progress Notes (Signed)
  Echocardiogram Echocardiogram Transesophageal has been performed.  Jennette Dubin 10/31/2015, 9:08 AM

## 2015-10-31 NOTE — Progress Notes (Signed)
Pt back on a Rate per MD. SIMV/PS ventilation. RN aware

## 2015-10-31 NOTE — Op Note (Signed)
CARDIOTHORACIC SURGERY OPERATIVE NOTE  Date of Procedure:   10/31/2015  Preoperative Diagnosis:    Severe Mitral Regurgitation  Recurrent Persistent Atrial Fibrillation  Postoperative Diagnosis: Same  Procedure:    Minimally-Invasive Mitral Valve Replacement  Edwards Magna Mitral Bovine bioprosthetic tissue valve (size 87mm, model #7300TFX, serial [id number])   Minimally-Invasive Maze Procedure  Complete bilateral atrial lesion set using cryothermy and bipolar radiofrequency ablation  Clipping of Left Atrial Appendage (Atricure left atrial clip, size 45 mm)  Surgeon: Valentina Gu. Roxy Manns, MD  Assistant: Arlyn Leak. 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 The patient states that she has been told that she had mitral valve prolapse and a heart murmur for most of her life. She developed recurrent paroxysmal SVT for which she underwent ablation in 2003 and again in 2007. At the time she lived in [place]. In 2008 she moved to [place] to be close to family and reestablish care locally with Dr. Acie Fredrickson who has been following her intermittently ever since. She remained stable and asymptomatic from a cardiac standpoint until this past summer when the patient began to develop symptoms of exertional  shortness of breath. Initially she attributed these symptoms to worsening allergies. In August she developed an acute episode of shortness of breath, weakness, and chest tightness while she was traveling in [place]. She was hospitalized for several days in [place] where she was diagnosed with atrial fibrillation, mitral regurgitation, and congestive heart failure. She was initially anticoagulated using Lovenox and treated with diltiazem and Lasix. She returned to [facility] and has been followed carefully ever since by Dr. Acie Fredrickson. She was started on coumadin and underwent TEE guided cardioversion on 08/22/2015. TEE revealed bileaflet prolapse with calcified leaflets and moderate-severe mitral regurgitation. Follow-up transthoracic echocardiogram performed 10/10/2015 Confirmed the presence of severely calcified mitral valve leaflets with restricted leaflet mobility and severe mitral regurgitation. Left ventricular size and systolic function remains normal. There is severe left atrial enlargement. The patient underwent left and right heart catheterization by Dr. Burt Knack on 10/18/2015. Catheterization was notable for the absence of significant coronary artery disease and only mild elevation of pulmonary artery pressures but hemodynamic and angiographic findings consistent with severe mitral regurgitation. The patient was referred for elective surgical consultation.  The patient has been seen in consultation and counseled at length regarding the indications, risks and potential benefits of surgery.  All questions have been answered, and the patient provides full informed consent for the operation as described.    DETAILS OF THE OPERATIVE PROCEDURE  Preparation:  The patient is brought to the operating room on the above mentioned date and central monitoring was established by the anesthesia team including placement of Swan-Ganz catheter through the left internal jugular vein.  A radial arterial line  is placed. The patient is placed in the supine position on the operating table.  Intravenous antibiotics are administered.  General endotracheal anesthesia is induced uneventfully. The patient is initially intubated using a dual lumen endotracheal tube.  A Foley catheter is placed.  Baseline transesophageal echocardiogram was performed.  Findings were notable for severe bileaflet prolapse with a very large mitral valve with billowing and thickened redundant leaflet tissue. There was heavy calcification in several areas, particularly involving a majority of the middle scallop of the anterior leaflet as well as some of the subvalvular apparatus. There was severe mitral regurgitation with a jet of regurgitation directed posteriorly around the left atrium. There was normal left ventricular systolic function but moderate left ventricular chamber enlargement. There was moderate to severe left atrial enlargement. There was trace to mild tricuspid regurgitation with normal right ventricular function. The aortic valve appeared normal.  A soft roll is placed behind the patient's left scapula and the neck gently extended and turned to the left.   The patient's right neck, chest, abdomen, both groins, and both lower extremities are prepared and draped in a sterile manner. A time out procedure is performed.   Surgical Approach:  A right miniature anterolateral thoracotomy incision is performed. The incision is placed just lateral to and superior to the right nipple. The pectoralis major muscle is retracted medially and completely preserved. The right pleural space is entered through the 3rd intercostal space. A soft tissue retractor is placed.  Two 11 mm ports are placed through separate stab incisions inferiorly. The right pleural space is insufflated continuously with carbon dioxide gas through the posterior port during the remainder of the operation.  A pledgeted sutures placed through the dome of the right  hemidiaphragm and retracted inferiorly to facilitate exposure.  A longitudinal incision is made in the pericardium 3 cm anterior to the phrenic nerve and silk traction sutures are placed on either side of the incision for exposure.   Extracorporeal Cardiopulmonary Bypass and Myocardial Protection:  A small incision is made in the right inguinal crease and the anterior surface of the right common femoral artery and right common femoral vein are identified.  The patient is placed in Trendelenburg position. The right internal jugular vein is cannulated with Seldinger technique and a guidewire advanced into the right atrium. The patient is heparinized systemically. The right internal jugular vein is cannulated with a 14 [place] pediatric femoral venous cannula. Pursestring sutures are placed on the anterior surface of the right common femoral vein and right common femoral artery. The right common femoral vein is cannulated with the Seldinger technique and a guidewire is advanced under transesophageal echocardiogram guidance through the right atrium. The femoral vein is cannulated with a long 22 French femoral venous cannula. The right common femoral artery is cannulated with Seldinger technique and a flexible guidewire is advanced until it can be appreciated intraluminally in the descending thoracic aorta on transesophageal echocardiogram. The femoral artery is cannulated with an 18 French femoral arterial cannula.  Adequate heparinization is verified.      The entire pre-bypass portion of the operation was notable for stable hemodynamics.  Cardiopulmonary bypass was begun.  Vacuum assist venous drainage is utilized. The incision in the pericardium is extended in both directions. Venous drainage and exposure are notably excellent.   A retrograde cardioplegia cannula is placed through the right atrium into the coronary sinus using transesophageal echocardiogram guidance.  An antegrade cardioplegia cannula is  placed in the ascending aorta.  The patient is cooled to 28C systemic temperature.  The aortic cross clamp is applied and cold blood cardioplegia is delivered  initially in an antegrade fashion through the aortic root.   Supplemental cardioplegia is given retrograde through the coronary sinus catheter. The initial cardioplegic arrest is rapid with early diastolic arrest.  Repeat doses of cardioplegia are administered intermittently every 20 to 30 minutes throughout the entire cross clamp portion of the operation through the aortic root and through the coronary sinus catheter in order to maintain completely flat electrocardiogram.  Myocardial protection was felt to be excellent.   Maze Procedure (left atrial lesion set):  The left atrial appendage is obliterated using an Atricure left atrial appendage clip (Atriclip, size 45 mm).  The clip is applied under thoracoscopic visualization posterior to the aorta and pulmonary artery through the oblique sinus.  The clip is applied prior to application of the aortic crossclamp, with transesophageal echocardiographic confirmation that the clip satisfactorily obliterates the appendage.  Following placement of the aortic crossclamp and the administration of the initial arresting dose of cardioplegia, a left atriotomy incision was performed through the interatrial groove and extended partially across the back wall of the left atrium after opening the oblique sinus inferiorly.  The mitral valve and floor of the left atrium are exposed using a self-retaining retractor.    The Atricure CryoICE nitrous oxide cryothermy system is utilized for all cryothermy ablation lesions.  The left atrial lesion set of the Cox cryomaze procedure is now performed using 3 minute duration for all cryothermy lesions.  Initially a lesion is placed along the endocardial surface of the left atrium from the caudad apex of the atriotomy incision across the posterior wall of the left atrium onto  the posterior mitral annulus.  A mirror image lesion along the epicardial surface is then performed with the probe posterior to the left atrium, crossing over the coronary sinus.  Two lesions are then performed to create a box isolating all of the pulmonary veins from the remainder of the left atrium.  The first lesion is placed from the cephalad apex of the atriotomy incision across the dome of the left atrium to just anterior to the left sided pulmonary veins.  The second lesion completes the box from the caudad apex of the atriotomy incision across the back wall of the left atrium to connect with the previous lesion just anterior to the left sided pulmonary veins.  An extra lesion is placed across the posterior wall of the left atrium to bisect the left and right sides. Finally, one additional lesion is placed from the inferior rim of the left-sided pulmonary veins across the Coumadin ridge and the base of the left atrial appendage to reach the posterior mitral annulus in the P1 P2 region.   Mitral Valve Replacement:  The mitral valve was inspected and notable for Barlow's-type degenerative disease with a very large mitral valve with severe billowing and prolapse involving both the anterior and posterior leaflets.  There was severe calcification involving the majority of the anterior leaflet as well as some of the subvalvular apparatus. The subvalvular apparatus beneath P1 and P2 was frozen and calcification. An attempted valve repair is felt not feasible as even edge to edge Alfieri repair would be problematic because of the degree of calcification of the anterior leaflet.  The entire anterior leaflet of mitral valve is excised sharply. The posterior leaflet was split in the midline. Subvalvular apparatus to the posterior leaflet was preserved. Cords to the anterior leaflet are not preserved because of the degree of calcification.  Mitral valve replacement is performed using interrupted 2-0 Ethibond  horizontal mattress pledgeted sutures with pledgets in the supra-annular position. Additional large pledgeted sutures are placed in the P1 P2 region of the posterior annulus because of severe calcification and friability.   An [facility] Mitral Bovine bioprosthetic tissue valve (size 12mm, model #7300TFX, serial [id number]) Is rinsed in saline per manufacture recommendations and implanted uneventfully. Care is taken to position the stent posts appropriately to avoid left ventricular outflow tract obstruction.  All sutures were secured using a Cor-knot device.  The valve is tested with saline and appears to be functioning normally. There is no paravalvular leak. Rewarming is begun.  The atriotomy was closed using a 2-layer closure of running 3-0 Prolene suture after placing a sump drain across the mitral valve to serve as a left ventricular vent.  One final dose of warm retrograde "hot shot" cardioplegia was administered retrograde through the coronary sinus catheter while all air was evacuated through the aortic root.  The aortic cross clamp was removed after a total cross clamp time of 135 minutes.   Maze Procedure (right atrial lesion set):  The retrograde cardioplegia cannula was removed and the small hole in the right atrium extended a short distance.  The AtriCure Synergy bipolar radiofrequency ablation clamp is utilized to create a series of linear lesions in the right atrium, each with one limb of the clamp along the endocardial surface and the other along the epicardial surface. The first lesion is placed from the posterior apex of the atriotomy incision and along the lateral wall of the right atrium to reach the lateral aspect of the superior vena cava. A second lesion is placed in the opposite direction from the posterior apex of the atriotomy incision along the lateral wall to reach the lateral aspect of the inferior vena cava. A third lesion is placed from the midportion of the atriotomy  incision extending at a right angle to reach the tip of the right atrial appendage. A fourth lesion is placed from the anterior apex of the atriotomy incision in an anterior and inferior direction to reach the acute margin of the heart. Finally, the cryotherapy probe is utilized to complete the right atrial lesion set by placing the probe along the endocardial surface of the right atrium from the anterior apex of the atriotomy incision to reach the tricuspid annulus at the 2:00 position. The atriotomy incision is closed with a 2 layer closure of running 4-0 Prolene suture.   Procedure Completion:  Epicardial pacing wires are fixed to the inferior wall of the right ventricule and to the right atrial appendage. The patient is rewarmed to 37C temperature. The left ventricular vent is removed.  The patient is ventilated and flow volumes turndown while the mitral valve repair is inspected using transesophageal echocardiogram. The valve repair appears intact with no residual leak. The antegrade cardioplegia cannula is now removed. The patient is weaned and disconnected from cardiopulmonary bypass.  The patient's rhythm at separation from bypass was AV paced.  The patient was weaned from bypass without any inotropic support. Total cardiopulmonary bypass time for the operation was 195 minutes.  Followup transesophageal echocardiogram performed after separation from bypass revealed a well-seated bioprosthetic tissue valve in the mitral position that is functioning normally. There was no paravalvular leak.  Left ventricular function was unchanged from preoperatively.  The femoral arterial and venous cannulae were removed uneventfully. There was a palpable pulse in the distal right common femoral artery after removal of the cannula. Protamine was administered to reverse the anticoagulation. The  right internal jugular cannula was removed and manual pressure held on the neck for 15 minutes.  Single lung ventilation was  begun. The atriotomy closure was inspected for hemostasis. The pericardial sac was drained using a 28 French Bard drain placed through the anterior port incision.  The pericardium was closed using a patch of core matrix bovine submucosal tissue patch. The right pleural space is irrigated with saline solution and inspected for hemostasis. The right pleural space was drained using a 28 French Bard drain placed through the posterior port incision. The miniature thoracotomy incision was closed in multiple layers in routine fashion. The right groin incision was inspected for hemostasis and closed in multiple layers in routine fashion.  The post-bypass portion of the operation was notable for stable rhythm and hemodynamics.  No blood products were administered during the operation.   Disposition:  The patient tolerated the procedure well.  The patient was reintubated using a single lumen endotracheal tube and subsequently transported to the surgical intensive care unit in stable condition. There were no intraoperative complications. All sponge instrument and needle counts are verified correct at completion of the operation.    Valentina Gu. Roxy Manns MD 10/31/2015 3:03 PM

## 2015-10-31 NOTE — Progress Notes (Signed)
RT performed recruitment maneuver on patient per MD verbal order. Recruitment maneuver was done over a combined total of 2 minutes. RT will continue to monitor.

## 2015-10-31 NOTE — Anesthesia Procedure Notes (Signed)
Procedure Name: Intubation Date/Time: 10/31/2015 8:09 AM Performed by: Clearnce Sorrel Pre-anesthesia Checklist: Patient identified, Timeout performed, Emergency Drugs available and Patient being monitored Patient Re-evaluated:Patient Re-evaluated prior to inductionOxygen Delivery Method: Circle system utilized Preoxygenation: Pre-oxygenation with 100% oxygen Intubation Type: IV induction Ventilation: Mask ventilation without difficulty Laryngoscope Size: Mac and 3 Grade View: Grade II Tube type: Oral Endobronchial tube: Double lumen EBT and 37 Fr Number of attempts: 2 (Intubated with a  7.5 ETT, then used tube exchanger to place double lumen EBT) Airway Equipment and Method: Video-laryngoscopy,  Fiberoptic brochoscope and Stylet Placement Confirmation: ETT inserted through vocal cords under direct vision,  breath sounds checked- equal and bilateral and positive ETCO2 Secured at: 27 cm Tube secured with: Tape Dental Injury: Injury to lip  Difficulty Due To: Difficult Airway- due to limited oral opening and Difficult Airway- due to anterior larynx

## 2015-10-31 NOTE — Brief Op Note (Addendum)
10/31/2015  12:49 PM  PATIENT:  [name] [name]  74 y.o. 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  MV Stenosis: Yes. Smallest Valve Area: 0.95cm2. Highest Mean Gradient: 5 mmHg.  MV Disease Functional Class: MV Disease Functional Class: Type II.  Etiology (Choose at least one and up to five): Degenerative. and Other.Calcified  MV Lesions (Choose at least one): Leaflet calcification.    Mitral Valve Procedure  Mitral Valve Procedure Performed:  Replacement: Repair not attempted prior to replacement.  Both Anterior and Posterior Mitral Chords Preserved.  Implant: Bioprosthetic Valve: Implant model number 7300TFX, Size 33, Unique Device Identifier [id number].   Maze Procedure  Surgical Approach: Right mini thoracotomy  Cut-and-sew:  No.  Cryo: Yes  Cryo Lesions (select all that apply):        2   Box Lesion,     4  Posterior Mirtal Annular Line,     6  Mitral Valve Cryo Lesion,     9   Intercaval Line to Tricuspid Annulus ("T" Lesion) and    10  Tricuspid Cryo Lesion, Medial   16  Other - epicardial posterior AV groove and coronary sinus, bisecting  lesion across posterior left atrial wall, connecting lesion from left inferior pulmonary vein to posterior mitral annulus   Radiofrequency:  Yes.  Bipolar: Yes.  RF Lesions (select all that apply):     9   Intercaval Line to Tricuspid Annulus ("T" Lesion),    11  Intercaval Line and   15b  RAA Lateral Wall to "T" Lesion     Left Atrial Appendage Treatment:    Yes  epicardial clip    COMPLICATIONS: None  BASELINE WEIGHT: 65 kg  PATIENT DISPOSITION:   TO SICU IN STABLE CONDITION  Rexene Alberts, MD 10/31/2015 2:59 PM

## 2015-11-01 ENCOUNTER — Encounter (HOSPITAL_COMMUNITY): Payer: Self-pay | Admitting: Thoracic Surgery (Cardiothoracic Vascular Surgery)

## 2015-11-01 ENCOUNTER — Inpatient Hospital Stay (HOSPITAL_COMMUNITY): Payer: Medicare Other

## 2015-11-01 LAB — GLUCOSE, CAPILLARY
Glucose-Capillary: 109 mg/dL — ABNORMAL HIGH (ref 65–99)
Glucose-Capillary: 110 mg/dL — ABNORMAL HIGH (ref 65–99)
Glucose-Capillary: 97 mg/dL (ref 65–99)
Glucose-Capillary: 130 mg/dL — ABNORMAL HIGH (ref 65–99)
Glucose-Capillary: 132 mg/dL — ABNORMAL HIGH (ref 65–99)
Glucose-Capillary: 123 mg/dL — ABNORMAL HIGH (ref 65–99)
Glucose-Capillary: 137 mg/dL — ABNORMAL HIGH (ref 65–99)
Glucose-Capillary: 135 mg/dL — ABNORMAL HIGH (ref 65–99)
Glucose-Capillary: 130 mg/dL — ABNORMAL HIGH (ref 65–99)
Glucose-Capillary: 111 mg/dL — ABNORMAL HIGH (ref 65–99)

## 2015-11-01 LAB — POCT I-STAT 3, ART BLOOD GAS (G3+)
Acid-base deficit: 6 mmol/L — ABNORMAL HIGH (ref 0.0–2.0)
Acid-base deficit: 1 mmol/L (ref 0.0–2.0)
Acid-base deficit: 2 mmol/L (ref 0.0–2.0)
Bicarbonate: 20 mEq/L (ref 20.0–24.0)
Bicarbonate: 23.6 mEq/L (ref 20.0–24.0)
Bicarbonate: 23.1 mEq/L (ref 20.0–24.0)
O2 Saturation: 95 %
O2 Saturation: 97 %
O2 Saturation: 95 %
Patient temperature: 36.8
Patient temperature: 36.8
Patient temperature: 36.8
TCO2: 21 mmol/L (ref 0–100)
TCO2: 25 mmol/L (ref 0–100)
TCO2: 24 mmol/L (ref 0–100)
pCO2 arterial: 38.4 mmHg (ref 35.0–45.0)
pCO2 arterial: 38.8 mmHg (ref 35.0–45.0)
pCO2 arterial: 37.9 mmHg (ref 35.0–45.0)
pH, Arterial: 7.324 — ABNORMAL LOW (ref 7.350–7.450)
pH, Arterial: 7.391 (ref 7.350–7.450)
pH, Arterial: 7.391 (ref 7.350–7.450)
pO2, Arterial: 82 mmHg (ref 80.0–100.0)
pO2, Arterial: 92 mmHg (ref 80.0–100.0)
pO2, Arterial: 73 mmHg — ABNORMAL LOW (ref 80.0–100.0)

## 2015-11-01 LAB — POCT I-STAT, CHEM 8
BUN: 16 mg/dL (ref 6–20)
Calcium, Ion: 1.18 mmol/L (ref 1.13–1.30)
Chloride: 106 mmol/L (ref 101–111)
Creatinine, Ser: 0.7 mg/dL (ref 0.44–1.00)
Glucose, Bld: 125 mg/dL — ABNORMAL HIGH (ref 65–99)
HCT: 25 % — ABNORMAL LOW (ref 36.0–46.0)
Hemoglobin: 8.5 g/dL — ABNORMAL LOW (ref 12.0–15.0)
Potassium: 3.6 mmol/L (ref 3.5–5.1)
Sodium: 146 mmol/L — ABNORMAL HIGH (ref 135–145)
TCO2: 25 mmol/L (ref 0–100)

## 2015-11-01 LAB — BASIC METABOLIC PANEL
Anion gap: 6 (ref 5–15)
BUN: 15 mg/dL (ref 6–20)
CO2: 26 mmol/L (ref 22–32)
Calcium: 8.5 mg/dL — ABNORMAL LOW (ref 8.9–10.3)
Chloride: 111 mmol/L (ref 101–111)
Creatinine, Ser: 0.71 mg/dL (ref 0.44–1.00)
GFR calc Af Amer: >60 mL/min (ref >60)
GFR calc non Af Amer: >60 mL/min (ref >60)
Glucose, Bld: 126 mg/dL — ABNORMAL HIGH (ref 65–99)
Potassium: 3.7 mmol/L (ref 3.5–5.1)
Sodium: 143 mmol/L (ref 135–145)

## 2015-11-01 LAB — CBC
HCT: 27.1 % — ABNORMAL LOW (ref 36.0–46.0)
HCT: 28.2 % — ABNORMAL LOW (ref 36.0–46.0)
Hemoglobin: 9.1 g/dL — ABNORMAL LOW (ref 12.0–15.0)
Hemoglobin: 9.2 g/dL — ABNORMAL LOW (ref 12.0–15.0)
MCH: 29.7 pg (ref 26.0–34.0)
MCH: 29.5 pg (ref 26.0–34.0)
MCHC: 33.6 g/dL (ref 30.0–36.0)
MCHC: 32.6 g/dL (ref 30.0–36.0)
MCV: 88.6 fL (ref 78.0–100.0)
MCV: 90.4 fL (ref 78.0–100.0)
Platelets: 47 10*3/uL — ABNORMAL LOW (ref 150–400)
Platelets: 53 10*3/uL — ABNORMAL LOW (ref 150–400)
RBC: 3.06 MIL/uL — ABNORMAL LOW (ref 3.87–5.11)
RBC: 3.12 MIL/uL — ABNORMAL LOW (ref 3.87–5.11)
RDW: 14.4 % (ref 11.5–15.5)
RDW: 14.8 % (ref 11.5–15.5)
WBC: 9.7 10*3/uL (ref 4.0–10.5)
WBC: 12.6 10*3/uL — ABNORMAL HIGH (ref 4.0–10.5)

## 2015-11-01 LAB — CREATININE, SERUM
Creatinine, Ser: 0.76 mg/dL (ref 0.44–1.00)
GFR calc Af Amer: >60 mL/min (ref >60)
GFR calc non Af Amer: >60 mL/min (ref >60)

## 2015-11-01 LAB — MAGNESIUM
Magnesium: 2.6 mg/dL — ABNORMAL HIGH (ref 1.7–2.4)
Magnesium: 2.5 mg/dL — ABNORMAL HIGH (ref 1.7–2.4)

## 2015-11-01 MED ORDER — SODIUM BICARBONATE 8.4 % IV SOLN
INTRAVENOUS | Status: AC
Start: 2015-11-01 — End: 2015-11-01
  Administered 2015-11-01: 50 meq via INTRAVENOUS
  Filled 2015-11-01: qty 50

## 2015-11-01 MED ORDER — CALCIUM CHLORIDE 10 % IV SOLN
INTRAVENOUS | Status: AC
  Administered 2015-11-01: 1000 mg
  Filled 2015-11-01: qty 10

## 2015-11-01 MED ORDER — FUROSEMIDE 10 MG/ML IJ SOLN: 20.0000 mg | Freq: Four times a day (QID) | INTRAMUSCULAR | Status: DC

## 2015-11-01 MED ORDER — POTASSIUM CHLORIDE 10 MEQ/50ML IV SOLN
10.0000 meq | INTRAVENOUS | Status: AC | PRN
  Administered 2015-11-01 (×3): 10 meq via INTRAVENOUS

## 2015-11-01 MED ORDER — ALBUMIN HUMAN 5 % IV SOLN
INTRAVENOUS | Status: AC
  Administered 2015-11-01: 12.5 g via INTRAVENOUS
  Filled 2015-11-01: qty 250

## 2015-11-01 MED ORDER — FUROSEMIDE 10 MG/ML IJ SOLN
20.0000 mg | Freq: Once | INTRAMUSCULAR | Status: AC
  Administered 2015-11-01: 20 mg via INTRAVENOUS
  Filled 2015-11-01: qty 2

## 2015-11-01 MED ORDER — ALPRAZOLAM 0.25 MG PO TABS
0.2500 mg | ORAL_TABLET | Freq: Every evening | ORAL | Status: DC | PRN
  Administered 2015-11-06: 0.25 mg via ORAL
  Filled 2015-11-01: qty 1

## 2015-11-01 MED ORDER — WARFARIN - PHYSICIAN DOSING INPATIENT
Freq: Every day | Status: DC
  Administered 2015-11-03: 1
  Administered 2015-11-05 – 2015-11-07 (×2)

## 2015-11-01 MED ORDER — BUTALBITAL-APAP-CAFFEINE 50-325-40 MG PO TABS
1.0000 | ORAL_TABLET | ORAL | Status: DC | PRN
  Administered 2015-11-01 – 2015-11-03 (×3): 1 via ORAL
  Filled 2015-11-01 (×3): qty 1

## 2015-11-01 MED ORDER — WARFARIN SODIUM 2.5 MG PO TABS
2.5000 mg | ORAL_TABLET | Freq: Every day | ORAL | Status: DC
  Administered 2015-11-01 – 2015-11-02 (×2): 2.5 mg via ORAL
  Filled 2015-11-01 (×3): qty 1

## 2015-11-01 NOTE — Discharge Instructions (Addendum)
1. Please obtain vital signs at least one time daily 2.Please weigh the patient daily. If he or she continues to gain weight or develops lower extremity edema, contact the office at [phone]. 3. Ambulate patient at least three times daily and please use sternal precautions. 4. Please monitor PT/INR.... Keep range 2.5-3.5, fax results to [facility] at [phone]     Information on my medicine - Coumadin   (Warfarin)  This medication education was reviewed with me or my healthcare representative as part of my discharge preparation.  The pharmacist that spoke with me during my hospital stay was:  Romona Curls, [facility]  Why was Coumadin prescribed for you? Coumadin was prescribed for you because you have a blood clot or a medical condition that can cause an increased risk of forming blood clots. Blood clots can cause serious health problems by blocking the flow of blood to the heart, lung, or brain. Coumadin can prevent harmful blood clots from forming. As a reminder your indication for Coumadin is:   Select from menu  What test will check on my response to Coumadin? While on Coumadin (warfarin) you will need to have an INR test regularly to ensure that your dose is keeping you in the desired range. The INR (international normalized ratio) number is calculated from the result of the laboratory test called prothrombin time (PT).  If an INR APPOINTMENT HAS NOT ALREADY BEEN MADE FOR YOU please schedule an appointment to have this lab work done by your health care provider within 7 days. Your INR goal is usually a number between:  2 to 3 or your provider may give you a more narrow range like 2-2.5.  Ask your health care provider during an office visit what your goal INR is.  What  do you need to  know  About  COUMADIN? Take Coumadin (warfarin) exactly as prescribed by your healthcare provider about the same time each day.  DO NOT stop taking without talking to the doctor who prescribed the medication.   Stopping without other blood clot prevention medication to take the place of Coumadin may increase your risk of developing a new clot or stroke.  Get refills before you run out.  What do you do if you miss a dose? If you miss a dose, take it as soon as you remember on the same day then continue your regularly scheduled regimen the next day.  Do not take two doses of Coumadin at the same time.  Important Safety Information A possible side effect of Coumadin (Warfarin) is an increased risk of bleeding. You should call your healthcare provider right away if you experience any of the following: ? Bleeding from an injury or your nose that does not stop. ? Unusual colored urine (red or dark brown) or unusual colored stools (red or black). ? Unusual bruising for unknown reasons. ? A serious fall or if you hit your head (even if there is no bleeding).  Some foods or medicines interact with Coumadin (warfarin) and might alter your response to warfarin. To help avoid this: ? Eat a balanced diet, maintaining a consistent amount of Vitamin K. ? Notify your provider about major diet changes you plan to make. ? Avoid alcohol or limit your intake to 1 drink for women and 2 drinks for men per day. (1 drink is 5 oz. wine, 12 oz. beer, or 1.5 oz. liquor.)  Make sure that ANY health care provider who prescribes medication for you knows that you are  taking Coumadin (warfarin).  Also make sure the healthcare provider who is monitoring your Coumadin knows when you have started a new medication including herbals and non-prescription products.  Coumadin (Warfarin)  Major Drug Interactions  Increased Warfarin Effect Decreased Warfarin Effect  Alcohol (large quantities) Antibiotics (esp. Septra/Bactrim, Flagyl, Cipro) Amiodarone (Cordarone) Aspirin (ASA) Cimetidine (Tagamet) Megestrol (Megace) NSAIDs (ibuprofen, naproxen, etc.) Piroxicam (Feldene) Propafenone (Rythmol SR) Propranolol (Inderal) Isoniazid  (INH) Posaconazole (Noxafil) Barbiturates (Phenobarbital) Carbamazepine (Tegretol) Chlordiazepoxide (Librium) Cholestyramine (Questran) Griseofulvin Oral Contraceptives Rifampin Sucralfate (Carafate) Vitamin K   Coumadin (Warfarin) Major Herbal Interactions  Increased Warfarin Effect Decreased Warfarin Effect  Garlic Ginseng Ginkgo biloba Coenzyme Q10 Green tea St. Johns wort    Coumadin (Warfarin) FOOD Interactions  Eat a consistent number of servings per week of foods HIGH in Vitamin K (1 serving =  cup)  Collards (cooked, or boiled & drained) Kale (cooked, or boiled & drained) Mustard greens (cooked, or boiled & drained) Parsley *serving size only =  cup Spinach (cooked, or boiled & drained) Swiss chard (cooked, or boiled & drained) Turnip greens (cooked, or boiled & drained)  Eat a consistent number of servings per week of foods MEDIUM-HIGH in Vitamin K (1 serving = 1 cup)  Asparagus (cooked, or boiled & drained) Broccoli (cooked, boiled & drained, or raw & chopped) Brussel sprouts (cooked, or boiled & drained) *serving size only =  cup Lettuce, raw (green leaf, endive, romaine) Spinach, raw Turnip greens, raw & chopped   These websites have more information on Coumadin (warfarin):  FailFactory.se; VeganReport.com.au;       Supplemental Discharge Instructions for  Pacemaker/Defibrillator Patients  Activity No heavy lifting or vigorous activity with your left/right arm for 6 to 8 weeks.  Do not raise your left/right arm above your head for one week.  Gradually raise your affected arm as drawn below.           11/11/15                    11/12/15                    11/13/15               11/14/15  NO DRIVING for 1 week or as per valve surgeon's instructions (whichever is longer)   WOUND CARE - Keep the wound area clean and dry.  Do not get this area wet for one week. No showers for one week; you may shower on  11/14/15 or as per  valve surgeon's instructions (whichever is longer)   . - The tape/steri-strips on your wound will fall off; do not pull them off.  No bandage is needed on the site.  DO  NOT apply any creams, oils, or ointments to the wound area. - If you notice any drainage or discharge from the wound, any swelling or bruising at the site, or you develop a fever > 101? F after you are discharged home, call the office at once.  Special Instructions - You are still able to use cellular telephones; use the ear opposite the side where you have your pacemaker/defibrillator.  Avoid carrying your cellular phone near your device. - When traveling through airports, show security personnel your identification card to avoid being screened in the metal detectors.  Ask the security personnel to use the hand wand. - Avoid arc welding equipment, MRI testing (magnetic resonance imaging), TENS units (transcutaneous nerve stimulators).  Call the office for questions about  other devices. - Avoid electrical appliances that are in poor condition or are not properly grounded. - Microwave ovens are safe to be near or to operate.  Additional information for defibrillator patients should your device go off: - If your device goes off ONCE and you feel fine afterward, notify the device clinic nurses. - If your device goes off ONCE and you do not feel well afterward, call 911. - If your device goes off TWICE, call 911. - If your device goes off THREE times in one day, call 911.  DO NOT DRIVE YOURSELF OR A FAMILY MEMBER WITH A DEFIBRILLATOR TO THE HOSPITAL--CALL 911.

## 2015-11-01 NOTE — Progress Notes (Signed)
Pt lives alone and she is requesting rehab @ [facility] - states she lives very close to facility and has friends who are residents there.  Dtr present and also requests pt transfer to [facility] for rehab.  Will refer to [facility]

## 2015-11-01 NOTE — Progress Notes (Signed)
Trying the weaning process again. RN at bedside at this time

## 2015-11-01 NOTE — Procedures (Signed)
Extubation Procedure Note  Patient Details:   Name: [name] [name] DOB: 26-Mar-1941 MRN: [id number]   Airway Documentation:     Evaluation  O2 sats: stable throughout Complications: No apparent complications Patient did tolerate procedure well. Bilateral Breath Sounds: Clear, Diminished Suctioning: Oral, Airway Yes  Pt passed all weaning parameters. Procedure was explained to pt prior to extubation. Pt had a audible cuff leak. Pt was extubated to a 4L [place] oxygen humidity provided. Pt was able to state her name post extubation, no stridor or distress noted. Pt tolerated procedure well no complications noted. Pt was stable throughout.     Leigh Aurora 11/01/2015, 4:01 AM

## 2015-11-01 NOTE — Progress Notes (Signed)
Pt is on CPAP/PS at this time, tolerating it well no distress noted, RN at bedside.

## 2015-11-01 NOTE — Progress Notes (Signed)
      [address]       [place] [address]             [phone]       C/o migraine  BP 96/65 mmHg  Pulse 80  Temp(Src) 97.7 F (36.5 C) (Oral)  Resp 21  Ht 5\' 6"  (1.676 m)  Wt 153 lb 10.6 oz (69.7 kg)  BMI 24.81 kg/m2  SpO2 93%  LMP  (Exact Date) Pacer dependent  Intake/Output Summary (Last 24 hours) at 11/01/15 1738 Last data filed at 11/01/15 1700  Gross per 24 hour  Intake 3495.86 ml  Output   3285 ml  Net 210.86 ml   Hct= 28  [name] [name] C. Roxan Hockey, MD [facility] [phone]

## 2015-11-01 NOTE — Progress Notes (Signed)
NIF -22,  VC 1.2. Pass pulmonary mechanics but ABG out of range, MD aware.

## 2015-11-01 NOTE — Progress Notes (Signed)
3rd attempt to wean to extubate, Pt is on CPAP/PS at this time no complication noted pt is pulling good volumes. No distress noted. RT will continue to monitor.

## 2015-11-01 NOTE — Progress Notes (Signed)
Anesthesiology Follow-up:  Awake and alert, complaining of headache C/W previous migraines. Neuro intact, hemodynamically stable, dopamine started last night for low CO, minimal incisional pain.  VS: T-37 BP- 102/71 HR- 80 (dual chamber paced) RR-18 O2 Sat 94% on 4L [place] PA 31/11 CO/CI 3.3/1.9  K-3.7 BUN/Cr. 15/0.71 glucose 117 H/H: 9.1/27 platelets 47,000  Extubated at 03:35 today.  Overall doing well, one day S/P MVR bioprosthetic valve, via minimally invasive technique. Will observe rhythm.  Roberts Gaudy

## 2015-11-01 NOTE — Progress Notes (Signed)
Pt is back on FS at this time, Met Alkalosis pt cardiac index is low. MD aware.

## 2015-11-01 NOTE — Progress Notes (Signed)
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  Soreness in right chest and back with movement and cough, otherwise denies any pain.  Feels "congested"  Objective: Vital signs: BP Readings from Last 1 Encounters:  11/01/15 108/72   Pulse Readings from Last 1 Encounters:  11/01/15 92   Resp Readings from Last 1 Encounters:  11/01/15 13   Temp Readings from Last 1 Encounters:  11/01/15 98.4 F (36.9 C)     Hemodynamics: PAP: (30-46)/(13-26) 33/15 mmHg CO:  [2.2 L/min-3.5 L/min] 3.3 L/min CI:  [1.3 L/min/m2-2.1 L/min/m2] 1.9 L/min/m2  Physical Exam:  Rhythm:   DDD paced - profound bradycardia under pacer  Breath sounds: clear  Heart sounds:  RRR w/out murmur  Incisions:  Dressings dry, intact  Abdomen:  Soft, non-distended, non-tender  Extremities:  Warm, well-perfused  Chest tubes:  Low volume thin serosanguinous output, no air leak    Intake/Output from previous day: 11/29 0701 - 11/30 0700 In: 8876 [I.V.:5471; Blood:675; NG/GT:30; IV Piggyback:2700] Out: [id number]; Blood:1685; Chest Tube:550] Intake/Output this shift: Total I/O In: 68.6 [I.V.:68.6] Out: 240 [Urine:150; Chest Tube:90]  Lab Results:  CBC: Recent Labs  10/31/15 2150 11/01/15 0204 11/01/15 0213  WBC 11.4*  --  9.7  HGB 9.9* 8.5* 9.1*  HCT 29.9* 25.0* 27.1*  PLT 62*  --  47*    BMET:  Recent Labs  11/01/15 0204 11/01/15 0213  NA 146* 143  K 3.6 3.7  CL 106 111  CO2  --  26  GLUCOSE 125* 126*  BUN 16 15  CREATININE 0.70 0.71  CALCIUM  --  8.5*     PT/INR:   Recent Labs  10/31/15 1530  LABPROT 21.2*  INR 1.84*    CBG (last 3)   Recent Labs   11/01/15 0101 11/01/15 0324 11/01/15 0810  GLUCAP 132* 123* 137*    ABG    Component Value Date/Time   PHART 7.391 11/01/2015 0656   PCO2ART 37.9 11/01/2015 0656   PO2ART 73.0* 11/01/2015 0656   HCO3 23.1 11/01/2015 0656   TCO2 24 11/01/2015 0656   ACIDBASEDEF 2.0 11/01/2015 0656   O2SAT 95.0 11/01/2015 0656    CXR: PORTABLE CHEST 1 VIEW  COMPARISON: Yesterday  FINDINGS: Interval tracheal and esophageal extubation. Lower lung volumes with increased basilar atelectasis.  Stable cardiopericardial size and mediastinal contours when allowing for differences in rotation. Changes of mitral valve replacement and left atrial appendage exclusion. Thoracic drains in stable position. Swan-Ganz catheter with tip directed to the right pulmonary artery. Neighboring left IJ central line with tip at the SVC level. No pneumothorax or increasing pleural fluid.  IMPRESSION: 1. Lower volumes and increased basilar atelectasis after extubation. 2. Remaining tubes and lines in stable position. 3. No visible pneumothorax.   Electronically Signed  By: Monte Fantasia M.D.  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

## 2015-11-02 ENCOUNTER — Inpatient Hospital Stay (HOSPITAL_COMMUNITY): Payer: Medicare Other

## 2015-11-02 LAB — GLUCOSE, CAPILLARY
Glucose-Capillary: 117 mg/dL — ABNORMAL HIGH (ref 65–99)
Glucose-Capillary: 117 mg/dL — ABNORMAL HIGH (ref 65–99)
Glucose-Capillary: 123 mg/dL — ABNORMAL HIGH (ref 65–99)

## 2015-11-02 LAB — BASIC METABOLIC PANEL
Anion gap: 9 (ref 5–15)
BUN: 18 mg/dL (ref 6–20)
CO2: 24 mmol/L (ref 22–32)
Calcium: 7.8 mg/dL — ABNORMAL LOW (ref 8.9–10.3)
Chloride: 104 mmol/L (ref 101–111)
Creatinine, Ser: 0.75 mg/dL (ref 0.44–1.00)
GFR calc Af Amer: >60 mL/min (ref >60)
GFR calc non Af Amer: >60 mL/min (ref >60)
Glucose, Bld: 146 mg/dL — ABNORMAL HIGH (ref 65–99)
Potassium: 3.8 mmol/L (ref 3.5–5.1)
Sodium: 137 mmol/L (ref 135–145)

## 2015-11-02 LAB — POCT I-STAT, CHEM 8
BUN: 17 mg/dL (ref 6–20)
Calcium, Ion: 1.08 mmol/L — ABNORMAL LOW (ref 1.13–1.30)
Chloride: 110 mmol/L (ref 101–111)
Creatinine, Ser: 0.7 mg/dL (ref 0.44–1.00)
Glucose, Bld: 152 mg/dL — ABNORMAL HIGH (ref 65–99)
HCT: 26 % — ABNORMAL LOW (ref 36.0–46.0)
Hemoglobin: 8.8 g/dL — ABNORMAL LOW (ref 12.0–15.0)
Potassium: 3.9 mmol/L (ref 3.5–5.1)
Sodium: 137 mmol/L (ref 135–145)
TCO2: 22 mmol/L (ref 0–100)

## 2015-11-02 LAB — POCT I-STAT 3, ART BLOOD GAS (G3+)
Acid-base deficit: 5 mmol/L — ABNORMAL HIGH (ref 0.0–2.0)
Bicarbonate: 19.8 mEq/L — ABNORMAL LOW (ref 20.0–24.0)
O2 Saturation: 96 %
Patient temperature: 36.5
TCO2: 21 mmol/L (ref 0–100)
pCO2 arterial: 32.8 mmHg — ABNORMAL LOW (ref 35.0–45.0)
pH, Arterial: 7.387 (ref 7.350–7.450)
pO2, Arterial: 83 mmHg (ref 80.0–100.0)

## 2015-11-02 LAB — POCT I-STAT 4, (NA,K, GLUC, HGB,HCT)
Glucose, Bld: 107 mg/dL — ABNORMAL HIGH (ref 65–99)
HCT: 29 % — ABNORMAL LOW (ref 36.0–46.0)
Hemoglobin: 9.9 g/dL — ABNORMAL LOW (ref 12.0–15.0)
Potassium: 3.9 mmol/L (ref 3.5–5.1)
Sodium: 139 mmol/L (ref 135–145)

## 2015-11-02 LAB — CBC
HCT: 28.5 % — ABNORMAL LOW (ref 36.0–46.0)
Hemoglobin: 9.3 g/dL — ABNORMAL LOW (ref 12.0–15.0)
MCH: 29.5 pg (ref 26.0–34.0)
MCHC: 32.6 g/dL (ref 30.0–36.0)
MCV: 90.5 fL (ref 78.0–100.0)
Platelets: 55 10*3/uL — ABNORMAL LOW (ref 150–400)
RBC: 3.15 MIL/uL — ABNORMAL LOW (ref 3.87–5.11)
RDW: 14.9 % (ref 11.5–15.5)
WBC: 12.8 10*3/uL — ABNORMAL HIGH (ref 4.0–10.5)

## 2015-11-02 LAB — PROTIME-INR
INR: 1.35 (ref 0.00–1.49)
Prothrombin Time: 16.8 seconds — ABNORMAL HIGH (ref 11.6–15.2)

## 2015-11-02 MED ORDER — SODIUM CHLORIDE 0.9 % IV SOLN: 250.0000 mL | INTRAVENOUS | Status: DC | PRN

## 2015-11-02 MED ORDER — SODIUM CHLORIDE 0.9 % IJ SOLN
3.0000 mL | Freq: Two times a day (BID) | INTRAMUSCULAR | Status: DC
  Administered 2015-11-02 – 2015-11-06 (×4): 3 mL via INTRAVENOUS

## 2015-11-02 MED ORDER — ASPIRIN EC 81 MG PO TBEC
81.0000 mg | DELAYED_RELEASE_TABLET | Freq: Every day | ORAL | Status: DC
  Administered 2015-11-02 – 2015-11-04 (×3): 81 mg via ORAL
  Filled 2015-11-02 (×3): qty 1

## 2015-11-02 MED ORDER — MONTELUKAST SODIUM 10 MG PO TABS
10.0000 mg | ORAL_TABLET | Freq: Every day | ORAL | Status: DC
  Administered 2015-11-02 – 2015-11-08 (×7): 10 mg via ORAL
  Filled 2015-11-02 (×7): qty 1

## 2015-11-02 MED ORDER — SODIUM CHLORIDE 0.9 % IJ SOLN: 3.0000 mL | INTRAMUSCULAR | Status: DC | PRN

## 2015-11-02 MED ORDER — MOVING RIGHT ALONG BOOK
Freq: Once | Status: AC
  Administered 2015-11-02: 09:00:00
  Filled 2015-11-02: qty 1

## 2015-11-02 MED ORDER — ROSUVASTATIN CALCIUM 5 MG PO TABS
5.0000 mg | ORAL_TABLET | ORAL | Status: DC
  Administered 2015-11-03 – 2015-11-09 (×4): 5 mg via ORAL
  Filled 2015-11-02 (×4): qty 1

## 2015-11-02 NOTE — Progress Notes (Signed)
Patient ID: SMT. DOBBERTIN, female   DOB: July 04, 1941, 74 y.o.   MRN: [id number]  SICU Evening Rounds:  Hemodynamically stable on neo 5 and dop 5.   Voided since foley taken out at 2 pm.   Wean vasopressors as BP allows

## 2015-11-02 NOTE — Plan of Care (Signed)
Problem: Phase II - Intermediate Post-Op Goal: Advance Diet Outcome: Not Progressing Pt with c/o nasea Goal: Activity Progressed Outcome: Not Progressing Pt refusing activity due to migraines

## 2015-11-02 NOTE — Progress Notes (Addendum)
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  Had a good night.  Denies pain, SOB.  Some dizziness.  Objective: Vital signs: BP Readings from Last 1 Encounters:  11/02/15 96/63   Pulse Readings from Last 1 Encounters:  11/02/15 80   Resp Readings from Last 1 Encounters:  11/02/15 14   Temp Readings from Last 1 Encounters:  11/02/15 97.4 F (36.3 C) Oral    Hemodynamics: PAP: (29-40)/(11-23) 40/19 mmHg  Physical Exam:  Rhythm:   Junctional w/ HR 50-55 - DDD pacing  Breath sounds: Diminished at bases  Heart sounds:  RRR w/out murmur  Incisions:  Dressings dry, intact  Abdomen:  Soft, non-distended, non-tender  Extremities:  Warm, well-perfused  Chest tubes:  Low volume thin serosanguinous output, no air leak    Intake/Output from previous day: 11/30 0701 - 12/01 0700 In: 645.1 [I.V.:595.1; IV Piggyback:50] Out: 2140 [Urine:1570; Chest Tube:570] Intake/Output this shift:    Lab Results:  CBC: Recent Labs  11/01/15 1700 11/02/15 0409  WBC 12.6* 12.8*  HGB 9.2* 9.3*  HCT 28.2* 28.5*  PLT 53* 55*    BMET:  Recent Labs  11/01/15 0213 11/01/15 1700 11/02/15 0409  NA 143  --  137  K 3.7  --  3.8  CL 111  --  104  CO2 26  --  24  GLUCOSE 126*  --  146*  BUN 15  --  18  CREATININE 0.71 0.76 0.75  CALCIUM 8.5*  --  7.8*     PT/INR:   Recent Labs  11/02/15 0409  LABPROT 16.8*  INR 1.35    CBG (last 3)   Recent Labs  11/01/15 1925 11/01/15 2352 11/02/15 0357  GLUCAP 111* 117* 123*    ABG    Component Value Date/Time   PHART 7.391 11/01/2015 0656   PCO2ART 37.9 11/01/2015 0656   PO2ART 73.0* 11/01/2015 0656   HCO3 23.1 11/01/2015 0656   TCO2  24 11/01/2015 0656   ACIDBASEDEF 2.0 11/01/2015 0656   O2SAT 95.0 11/01/2015 0656    CXR: PORTABLE CHEST 1 VIEW  COMPARISON: 11/01/2015.  FINDINGS: Interim removal Swan-Ganz catheter. Left IJ line and right chest tubes in stable position. Prior mitral valve replacement. Left atrial appendage clip. Dense right base subsegmental atelectasis. No prominent pleural effusion or pneumothorax. Mild right chest wall subcutaneous emphysema .  IMPRESSION: 1. Interim removal Swan-Ganz catheter. 2. Remaining lines and tubes in including 2 right chest tubes in stable position. No pneumothorax. 3. Prior mitral valve replacement. Left atrial appendage clip noted. Heart size stable. No evidence of overt congestive heart failure. 4. Dense right base subsegmental atelectasis.   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)  Overall stable POD2 Maintaining DDD paced rhythm w/ stable BP off Neo, still on low dose dopamine for BP support Underlying rhythm improved, looks junctional w/ HR 50-55 Expected post op acute blood loss anemia, stable Expected post op volume excess, UOP adequate, weight 5-7 kg above pre-op baseline Expected post op atelectasis, R>L  Post op thrombocytopenia with h/o chronic thrombocytopenia pre-op, platelet count stable 55k Chronic migraine headaches Chronic anxiety   Wean dopamine as tolerated  Transfer step down once stable off dopamine  Continue DDD pacing and hold amiodarone, beta blockers  Hold diuretics until BP improves  Leave chest tubes until drainage decreases further  D/C foley  Mobilize  Rexene Alberts, MD 11/02/2015 7:50 AM

## 2015-11-03 DIAGNOSIS — I495 Sick sinus syndrome: Secondary | ICD-10-CM

## 2015-11-03 LAB — CBC
HCT: 28 % — ABNORMAL LOW (ref 36.0–46.0)
Hemoglobin: 9.2 g/dL — ABNORMAL LOW (ref 12.0–15.0)
MCH: 30 pg (ref 26.0–34.0)
MCHC: 32.9 g/dL (ref 30.0–36.0)
MCV: 91.2 fL (ref 78.0–100.0)
Platelets: 59 10*3/uL — ABNORMAL LOW (ref 150–400)
RBC: 3.07 MIL/uL — ABNORMAL LOW (ref 3.87–5.11)
RDW: 14.7 % (ref 11.5–15.5)
WBC: 10.5 10*3/uL (ref 4.0–10.5)

## 2015-11-03 LAB — POCT I-STAT 3, ART BLOOD GAS (G3+)
Acid-base deficit: 4 mmol/L — ABNORMAL HIGH (ref 0.0–2.0)
Bicarbonate: 24.7 mEq/L — ABNORMAL HIGH (ref 20.0–24.0)
O2 Saturation: 100 %
TCO2: 26 mmol/L (ref 0–100)
pCO2 arterial: 59.5 mmHg (ref 35.0–45.0)
pH, Arterial: 7.226 — ABNORMAL LOW (ref 7.350–7.450)
pO2, Arterial: 301 mmHg — ABNORMAL HIGH (ref 80.0–100.0)

## 2015-11-03 LAB — BASIC METABOLIC PANEL
Anion gap: 6 (ref 5–15)
BUN: 20 mg/dL (ref 6–20)
CO2: 26 mmol/L (ref 22–32)
Calcium: 7.7 mg/dL — ABNORMAL LOW (ref 8.9–10.3)
Chloride: 102 mmol/L (ref 101–111)
Creatinine, Ser: 0.67 mg/dL (ref 0.44–1.00)
GFR calc Af Amer: >60 mL/min (ref >60)
GFR calc non Af Amer: >60 mL/min (ref >60)
Glucose, Bld: 118 mg/dL — ABNORMAL HIGH (ref 65–99)
Potassium: 3.8 mmol/L (ref 3.5–5.1)
Sodium: 134 mmol/L — ABNORMAL LOW (ref 135–145)

## 2015-11-03 LAB — POCT I-STAT, CHEM 8
BUN: 36 mg/dL — ABNORMAL HIGH (ref 6–20)
Calcium, Ion: 1.32 mmol/L — ABNORMAL HIGH (ref 1.13–1.30)
Chloride: 108 mmol/L (ref 101–111)
Creatinine, Ser: 1.6 mg/dL — ABNORMAL HIGH (ref 0.44–1.00)
Glucose, Bld: 192 mg/dL — ABNORMAL HIGH (ref 65–99)
HCT: 41 % (ref 36.0–46.0)
Hemoglobin: 13.9 g/dL (ref 12.0–15.0)
Potassium: 4.2 mmol/L (ref 3.5–5.1)
Sodium: 142 mmol/L (ref 135–145)
TCO2: 25 mmol/L (ref 0–100)

## 2015-11-03 LAB — PROTIME-INR
INR: 1.61 — ABNORMAL HIGH (ref 0.00–1.49)
Prothrombin Time: 19.2 seconds — ABNORMAL HIGH (ref 11.6–15.2)

## 2015-11-03 MED ORDER — WARFARIN SODIUM 1 MG PO TABS
1.0000 mg | ORAL_TABLET | Freq: Every day | ORAL | Status: DC
  Administered 2015-11-03: 1 mg via ORAL
  Filled 2015-11-03 (×2): qty 1

## 2015-11-03 MED ORDER — POTASSIUM CHLORIDE 10 MEQ/50ML IV SOLN
10.0000 meq | INTRAVENOUS | Status: AC
  Administered 2015-11-03 (×2): 10 meq via INTRAVENOUS
  Filled 2015-11-03 (×2): qty 50

## 2015-11-03 NOTE — Consult Note (Signed)
Reason for Consult: sinus bradycardia  Referring Physician: Dr. Velna [name] is an 73 y.o. female.   HPI: The patient is a 74 yo woman with mitral valve prolapse and regurgitation who also has acute on chronic heart failure who underwent mitral valve repair and MAZE several days ago. In the interim, she has had bradycardia and is using her temporary PM. She is on no AV nodal or sinus nodal blocking drugs. She has otherwise been stable since surgery.   PMH: Past Medical History  Diagnosis Date  . SVT (supraventricular tachycardia) ([place])   . MVP (mitral valve prolapse)   . Hyperlipidemia   . Idiopathic thrombocytopenic purpura (ITP)   . Hemorrhoid   . IBS (irritable bowel syndrome)   . Migraine   . Severe mitral regurgitation   . RBBB   . Anxiety associated with depression     Prn alprazolam   . Thoracic aorta atherosclerosis ([place])   . Nodule of right lung   . Restrictive lung disease     Mild on PFT & likely cardiac in etiology   . 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 Atrial fibrillation, persistent ([place])     DCCV 08/22/2015  . Acute on chronic diastolic (congestive) heart failure ([place])   . 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 S/P Minimally invasive maze operation for atrial fibrillation 10/31/2015    Complete bilateral atrial lesion set using cryothermy and bipolar radiofrequency ablation with clipping of LA appendage via right mini thoracotomy approach    PSHX: Past Surgical History  Procedure Laterality Date  . Tubal ligation    . Foot surgery    . Varicose vein surgery Right   . Mandible fracture surgery  03-26-13  . Colonoscopy  2003  . Eye surgery Right 2013  . Breast biopsy    . Cardioversion N/A 08/22/2015    Procedure: CARDIOVERSION;  Surgeon: Thayer Headings, MD;  Location: [facility] ENDOSCOPY;  Service:  Cardiovascular;  Laterality: N/A;  . Tee without cardioversion N/A 08/22/2015    Procedure: TRANSESOPHAGEAL ECHOCARDIOGRAM (TEE);  Surgeon: Thayer Headings, MD;  Location: [facility] ENDOSCOPY;  Service: Cardiovascular;  Laterality: N/A;  Darden Dates with cardioversion    . Cardiac catheterization N/A 10/18/2015    Procedure: Right/Left Heart Cath and Coronary Angiography;  Surgeon: Sherren Mocha, MD;  Location: [facility] CV LAB;  Service: Cardiovascular;  Laterality: N/A;  . Minimally invasive maze procedure N/A 10/31/2015    Procedure: MINIMALLY INVASIVE MAZE PROCEDURE;  Surgeon: Rexene Alberts, MD;  Location: [place];  Service: Open Heart Surgery;  Laterality: N/A;  . Tee without cardioversion N/A 10/31/2015    Procedure: TRANSESOPHAGEAL ECHOCARDIOGRAM (TEE);  Surgeon: Rexene Alberts, MD;  Location: [place];  Service: Open Heart Surgery;  Laterality: N/A;  . Mitral valve replacement Right 10/31/2015    Procedure: MINIMALLY INVASIVE MITRAL VALVE (MV) REPLACEMENT;  Surgeon: Rexene Alberts, MD;  Location: [place];  Service: Open Heart Surgery;  Laterality: Right;    FAMHX: Family History  Problem Relation Age of Onset  . Hypertension Mother   . Arrhythmia Mother   . Heart failure Mother   . Arrhythmia Brother   . Stroke Brother 69    cerebral hemorrhage, nonsmoker, no HTN  . Stroke Father     from an aneurysm  . Stroke Maternal Aunt 83  cerebral hemorrhage  . Liver cancer Maternal Grandmother   . Atrial fibrillation Son   . Heart attack Neg Hx     Social History:  reports that she has never smoked. She has never used smokeless tobacco. She reports that she does not drink alcohol or use illicit drugs.  Allergies:  Allergies  Allergen Reactions  . Oxycodone-Acetaminophen Other (See Comments)    Other Reaction: migraine  . Meloxicam Other (See Comments)    Severe reflux  . Codeine Other (See Comments)    migraine  . Doxycycline Itching  . Hydrocodone Nausea And Vomiting    MIGRAINE  .  Hydromorphone Nausea And Vomiting  . Oxycodone Other (See Comments)    Severe migraine    Medications: I have reviewed the patient's current medications.  Dg Chest Port 1 View  11/02/2015  CLINICAL DATA:  Maze procedure.  Mitral valve replacement. EXAM: PORTABLE CHEST 1 VIEW COMPARISON:  11/01/2015. FINDINGS: Interim removal Swan-Ganz catheter. Left IJ line and right chest tubes in stable position. Prior mitral valve replacement. Left atrial appendage clip. Dense right base subsegmental atelectasis. No prominent pleural effusion or pneumothorax. Mild right chest wall subcutaneous emphysema . IMPRESSION: 1. Interim removal Swan-Ganz catheter. 2. Remaining lines and tubes in including 2 right chest tubes in stable position. No pneumothorax. 3. Prior mitral valve replacement. Left atrial appendage clip noted. Heart size stable. No evidence of overt congestive heart failure. 4. Dense right base subsegmental atelectasis. Electronically Signed   By: Marcello Moores  Register   On: 11/02/2015 07:43    ROS  As stated in the HPI and negative for all other systems.  Physical Exam  Vitals:Blood pressure 105/67, pulse 80, temperature 98.3 F (36.8 C), temperature source Oral, resp. rate 15, height 5\' 6"  (1.676 m), weight 155 lb 3.3 oz (70.4 kg), SpO2 96 %.  Well appearing 74 yo woman, NAD HEENT: Unremarkable Neck:  6 cm JVD, no thyromegally Lymphatics:  No adenopathy Back:  No CVA tenderness Lungs:  Clear except for basilar rales. HEART:  Regular rate rhythm, no murmurs, no rubs, no clicks Abd:  Flat, positive bowel sounds, no organomegally, no rebound, no guarding Ext:  2 plus pulses, no edema, no cyanosis, no clubbing Skin:  No rashes no nodules Neuro:  CN II through XII intact, motor grossly intact  ECG - AV pacing. Underlying rhythm is sinus brady, less than 30/min.  Assessment/Plan: 1. 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 Baseline RBBB 3. PAF 4. Mitral valve prolapse with  MR, s/p mitral valve repair Rec: She has profound bradycardia. Hopefully this will improved. If her conduction has not recovered by Monday, I would recommend proceeding with PPM. I have discussed the risk/benefits/goals/expectations of the procedure and she wishes to proceed. Will avoid sinus and aV nodal blocking drugs.   Carleene Overlie TaylorMD 11/03/2015, 5:29 PM

## 2015-11-03 NOTE — Care Management Important Message (Signed)
Important Message  Patient Details  Name: [name] [name] MRN: [id number] Date of Birth: 01-07-1941   Medicare Important Message Given:  Yes    [name] P Donyae Kohn 11/03/2015, 1:50 PM

## 2015-11-03 NOTE — Progress Notes (Signed)
      [address]       [place] [address]             [phone]        CARDIOTHORACIC SURGERY PROGRESS NOTE   R3 Days Post-Op Procedure(s) (LRB): MINIMALLY INVASIVE MAZE PROCEDURE (N/A) TRANSESOPHAGEAL ECHOCARDIOGRAM (TEE) (N/A) MINIMALLY INVASIVE MITRAL VALVE (MV) REPLACEMENT (Right)  Subjective: Feels okay.  No pain.  Weak.  Objective: Vital signs: BP Readings from Last 1 Encounters:  11/03/15 82/43   Pulse Readings from Last 1 Encounters:  11/03/15 85   Resp Readings from Last 1 Encounters:  11/03/15 21   Temp Readings from Last 1 Encounters:  11/03/15 98.3 F (36.8 C) Oral    Hemodynamics:    Physical Exam:  Rhythm:   DDD paced - still bradycardic under pacer  Breath sounds: Diminished at bases  Heart sounds:  RRR w/out murmur  Incisions:  Clean and dry  Abdomen:  Soft, non-distended, non-tender  Extremities:  Warm, well-perfused  Chest tubes:  Low volume thin serosanguinous output but still draining too much for tube removal, no air leak    Intake/Output from previous day: 12/01 0701 - 12/02 0700 In: 416.8 [P.O.:120; I.V.:246.8; IV Piggyback:50] Out: [id number] [Urine:635; Chest Tube:560] Intake/Output this shift: Total I/O In: -  Out: 360 [Urine:300; Chest Tube:60]  Lab Results:  CBC: Recent Labs  11/02/15 0409 11/03/15 0420  WBC 12.8* 10.5  HGB 9.3* 9.2*  HCT 28.5* 28.0*  PLT 55* 59*    BMET:  Recent Labs  11/02/15 0409 11/03/15 0420  NA 137 134*  K 3.8 3.8  CL 104 102  CO2 24 26  GLUCOSE 146* 118*  BUN 18 20  CREATININE 0.75 0.67  CALCIUM 7.8* 7.7*     PT/INR:   Recent Labs  11/03/15 0420  LABPROT 19.2*  INR 1.61*    CBG (last 3)   Recent Labs  11/01/15 2352 11/02/15 0357 11/02/15 0733  GLUCAP 117* 123* 117*    ABG    Component Value Date/Time   PHART 7.226* 11/01/2015 0941   PCO2ART 59.5* 11/01/2015 0941   PO2ART 301.0* 11/01/2015 0941   HCO3 24.7* 11/01/2015 0941   TCO2 22 11/01/2015  1710   ACIDBASEDEF 4.0* 11/01/2015 0941   O2SAT 100.0 11/01/2015 0941    CXR: n/a  Assessment/Plan: S/P Procedure(s) (LRB): MINIMALLY INVASIVE MAZE PROCEDURE (N/A) TRANSESOPHAGEAL ECHOCARDIOGRAM (TEE) (N/A) MINIMALLY INVASIVE MITRAL VALVE (MV) REPLACEMENT (Right)  Overall stable POD3 although still on low dose dopamine and neo drips for BP support Maintaining DDD paced rhythm, underlying rhythm still very bradycardic Expected post op acute blood loss anemia, stable Expected post op volume excess, UOP adequate, weight stable 5-7 kg above pre-op baseline Expected post op atelectasis, R>L Post op thrombocytopenia with h/o chronic thrombocytopenia pre-op, platelet count up slightly 59k Chronic migraine headaches Chronic anxiety   Wean dopamine and neo as tolerated  Transfer step down once stable off dopamine  Continue DDD pacing and ask for EPS consult - may need pacer if rhythm doesn't improve over the weekend  Leave chest tubes until drainage decreases further  Decrease coumadin 1 mg today  Mobilize  Rexene Alberts, MD 11/03/2015 1:53 PM

## 2015-11-04 ENCOUNTER — Inpatient Hospital Stay (HOSPITAL_COMMUNITY): Payer: Medicare Other

## 2015-11-04 DIAGNOSIS — I442 Atrioventricular block, complete: Secondary | ICD-10-CM

## 2015-11-04 LAB — PROTIME-INR
INR: 2.15 — ABNORMAL HIGH (ref 0.00–1.49)
Prothrombin Time: 23.9 seconds — ABNORMAL HIGH (ref 11.6–15.2)

## 2015-11-04 LAB — BASIC METABOLIC PANEL
Anion gap: 7 (ref 5–15)
BUN: 15 mg/dL (ref 6–20)
CO2: 25 mmol/L (ref 22–32)
Calcium: 7.7 mg/dL — ABNORMAL LOW (ref 8.9–10.3)
Chloride: 101 mmol/L (ref 101–111)
Creatinine, Ser: 0.63 mg/dL (ref 0.44–1.00)
GFR calc Af Amer: >60 mL/min (ref >60)
GFR calc non Af Amer: >60 mL/min (ref >60)
Glucose, Bld: 114 mg/dL — ABNORMAL HIGH (ref 65–99)
Potassium: 3.6 mmol/L (ref 3.5–5.1)
Sodium: 133 mmol/L — ABNORMAL LOW (ref 135–145)

## 2015-11-04 LAB — CBC
HCT: 29.3 % — ABNORMAL LOW (ref 36.0–46.0)
Hemoglobin: 9.4 g/dL — ABNORMAL LOW (ref 12.0–15.0)
MCH: 29.1 pg (ref 26.0–34.0)
MCHC: 32.1 g/dL (ref 30.0–36.0)
MCV: 90.7 fL (ref 78.0–100.0)
Platelets: 83 10*3/uL — ABNORMAL LOW (ref 150–400)
RBC: 3.23 MIL/uL — ABNORMAL LOW (ref 3.87–5.11)
RDW: 14.5 % (ref 11.5–15.5)
WBC: 8.4 10*3/uL (ref 4.0–10.5)

## 2015-11-04 MED ORDER — POTASSIUM CHLORIDE 10 MEQ/50ML IV SOLN
10.0000 meq | INTRAVENOUS | Status: AC
  Administered 2015-11-04: 10 meq via INTRAVENOUS
  Filled 2015-11-04: qty 50

## 2015-11-04 MED ORDER — FUROSEMIDE 10 MG/ML IJ SOLN
20.0000 mg | Freq: Once | INTRAMUSCULAR | Status: AC
  Administered 2015-11-04: 20 mg via INTRAVENOUS
  Filled 2015-11-04: qty 2

## 2015-11-04 MED ORDER — POTASSIUM CHLORIDE 10 MEQ/50ML IV SOLN
10.0000 meq | INTRAVENOUS | Status: AC
  Administered 2015-11-04 (×3): 10 meq via INTRAVENOUS
  Filled 2015-11-04 (×3): qty 50

## 2015-11-04 NOTE — Progress Notes (Signed)
   SUBJECTIVE: Remains quite ill.  Recovering postoperatively.  Marland Kitchen acetaminophen  1,000 mg Oral 4 times per day  . bisacodyl  10 mg Oral Daily   Or  . bisacodyl  10 mg Rectal Daily  . chlorhexidine  15 mL Mouth/Throat BID  . docusate sodium  200 mg Oral Daily  . montelukast  10 mg Oral QHS  . pantoprazole  40 mg Oral Daily  . rosuvastatin  5 mg Oral QODAY  . sodium chloride  3 mL Intravenous Q12H  . sodium chloride  3 mL Intravenous Q12H  . Warfarin - Physician Dosing Inpatient   Does not apply q1800   . sodium chloride 250 mL (11/01/15 0654)  . DOPamine 5 mcg/kg/min (11/04/15 1000)    OBJECTIVE: Physical Exam: Filed Vitals:   11/04/15 0800 11/04/15 0801 11/04/15 0900 11/04/15 1000  BP: 98/61  92/57 100/59  Pulse: 89     Temp:  98 F (36.7 C)    TempSrc:  Oral    Resp: 18  22 24   Height:      Weight:      SpO2: 97%       Intake/Output Summary (Last 24 hours) at 11/04/15 1220 Last data filed at 11/04/15 1000  Gross per 24 hour  Intake 598.69 ml  Output   1500 ml  Net -901.31 ml    Telemetry reveals AV pacing  GEN- The patient is ill appearing, alert and oriented x 3 today.   Head- normocephalic, atraumatic Eyes-  Sclera clear, conjunctiva pink Ears- hearing intact Oropharynx- clear Neck- supple, L IJ catheter in place Lungs- decreased BS, normal work of breathing Heart- Regular rate and rhythm (paced) GI- soft, NT, ND, + BS Extremities- no clubbing, cyanosis, + dependant edema Skin- no rash or lesion Psych- euthymic mood, full affect Neuro- strength and sensation are intact  LABS: Basic Metabolic Panel:  Recent Labs  11/01/15 1700  11/03/15 0420 11/04/15 0400  NA  --   < > 134* 133*  K  --   < > 3.8 3.6  CL  --   < > 102 101  CO2  --   < > 26 25  GLUCOSE  --   < > 118* 114*  BUN  --   < > 20 15  CREATININE 0.76  < > 0.67 0.63  CALCIUM  --   < > 7.7* 7.7*  MG 2.5*  --   --   --   < > = values in this interval not displayed. Liver Function  Tests: No results for input(s): AST, ALT, ALKPHOS, BILITOT, PROT, ALBUMIN in the last 72 hours. No results for input(s): LIPASE, AMYLASE in the last 72 hours. CBC:  Recent Labs  11/03/15 0420 11/04/15 0400  WBC 10.5 8.4  HGB 9.2* 9.4*  HCT 28.0* 29.3*  MCV 91.2 90.7  PLT 59* 83*    ASSESSMENT AND PLAN:  1. Sick sinus/ AV block Remains pacer dependant There is a reasonable chance that her bradycardia may not resolve.  If not improved, would consider pacing early next week. Would like to remove CVLs 24 hours prior to pacing if possible.  She remains quite ill.  Hopefully chest tubes will come out soon.  Will follow along  Thompson Grayer, MD 11/04/2015 12:20 PM

## 2015-11-04 NOTE — Progress Notes (Signed)
[address]       [place], [address]             [phone]        CARDIOTHORACIC SURGERY PROGRESS NOTE   R4 Days Post-Op Procedure(s) (LRB): MINIMALLY INVASIVE MAZE PROCEDURE (N/A) TRANSESOPHAGEAL ECHOCARDIOGRAM (TEE) (N/A) MINIMALLY INVASIVE MITRAL VALVE (MV) REPLACEMENT (Right)  Subjective: Feels a little better today.  Mild pain with coughing and movement.  No SOB  Objective: Vital signs: BP Readings from Last 1 Encounters:  11/04/15 93/64   Pulse Readings from Last 1 Encounters:  11/04/15 89   Resp Readings from Last 1 Encounters:  11/04/15 14   Temp Readings from Last 1 Encounters:  11/04/15 98 F (36.7 C) Oral    Hemodynamics:    Physical Exam:  Rhythm:   AV paced - still profoundly bradycardic under pacer  Breath sounds: clear  Heart sounds:  RRR  Incisions:  Clean and dry  Abdomen:  Soft, non-distended, non-tender  Extremities:  Warm, well-perfused  Chest tubes:  Thin serosanguinous output, still draining too much for tube removal, no air leak     Intake/Output from previous day: 12/02 0701 - 12/03 0700 In: 702.3 [P.O.:270; I.V.:282.3; IV Piggyback:150] Out: 1860 [Urine:1450; Chest Tube:410] Intake/Output this shift:    Lab Results:  CBC: Recent Labs  11/03/15 0420 11/04/15 0400  WBC 10.5 8.4  HGB 9.2* 9.4*  HCT 28.0* 29.3*  PLT 59* 83*    BMET:  Recent Labs  11/03/15 0420 11/04/15 0400  NA 134* 133*  K 3.8 3.6  CL 102 101  CO2 26 25  GLUCOSE 118* 114*  BUN 20 15  CREATININE 0.67 0.63  CALCIUM 7.7* 7.7*     PT/INR:   Recent Labs  11/04/15 0400  LABPROT 23.9*  INR 2.15*    CBG (last 3)   Recent Labs  11/01/15 2352 11/02/15 0357 11/02/15 0733  GLUCAP 117* 123* 117*    ABG    Component Value Date/Time   PHART 7.226* 11/01/2015 0941   PCO2ART 59.5* 11/01/2015 0941   PO2ART 301.0* 11/01/2015 0941   HCO3 24.7* 11/01/2015 0941   TCO2 22 11/01/2015 1710   ACIDBASEDEF 4.0* 11/01/2015  0941   O2SAT 100.0 11/01/2015 0941    CXR: PORTABLE CHEST 1 VIEW  COMPARISON: 11/02/2015 and 11/01/2015.  FINDINGS: 0533 hours. Left IJ central venous catheter and 2 right-sided chest tubes are unchanged in position. The heart size and mediastinal contours are stable status post mitral valve surgery and clipping of the left atrial appendage. Bibasilar atelectasis persists with interval improvement in the right basilar component. There is a small amount of pleural fluid bilaterally. There is soft tissue emphysema within the right chest wall. No evidence of pneumothorax.  IMPRESSION: Slight improvement in right basilar aeration. Otherwise stable postoperative appearance of the chest. No evidence of pneumothorax.   Electronically Signed  By: Richardean Sale M.D.  On: 11/04/2015 09:38   Assessment/Plan: S/P Procedure(s) (LRB): MINIMALLY INVASIVE MAZE PROCEDURE (N/A) TRANSESOPHAGEAL ECHOCARDIOGRAM (TEE) (N/A) MINIMALLY INVASIVE MITRAL VALVE (MV) REPLACEMENT (Right)  Overall stable POD4 although still pacer-dependent BP stable off Neo drip although still on low dose dopamine Expected post op acute blood loss anemia, stable Expected post op volume excess, UOP adequate, weight stable 5-7 kg above pre-op baseline Expected post op atelectasis, R>L Post op thrombocytopenia with h/o chronic thrombocytopenia pre-op, platelet count up to 83k INR up to 2.1 after only 2 small doses warfarin Chronic migraine headaches Chronic  anxiety   Wean dopamine as tolerated  Keep in SICU until she gets permanent pacer or underlying rhythm improves  Leave chest tubes until output decreases further  Hold coumadin today   [name] Alberts, MD 11/04/2015 9:59 AM

## 2015-11-04 NOTE — Progress Notes (Signed)
[facility] BRIEF SICU PROGRESS NOTE  4 Days Post-Op  S/P Procedure(s) (LRB): MINIMALLY INVASIVE MAZE PROCEDURE (N/A) TRANSESOPHAGEAL ECHOCARDIOGRAM (TEE) (N/A) MINIMALLY INVASIVE MITRAL VALVE (MV) REPLACEMENT (Right)   Stable day but still pacer dependent Diuresing fairly well  Plan: Continue current plan  Rexene Alberts, MD 11/04/2015 4:53 PM

## 2015-11-05 ENCOUNTER — Inpatient Hospital Stay (HOSPITAL_COMMUNITY): Payer: Medicare Other

## 2015-11-05 LAB — CBC
HCT: 30.1 % — ABNORMAL LOW (ref 36.0–46.0)
Hemoglobin: 10 g/dL — ABNORMAL LOW (ref 12.0–15.0)
MCH: 29.9 pg (ref 26.0–34.0)
MCHC: 33.2 g/dL (ref 30.0–36.0)
MCV: 90.1 fL (ref 78.0–100.0)
Platelets: 109 10*3/uL — ABNORMAL LOW (ref 150–400)
RBC: 3.34 MIL/uL — ABNORMAL LOW (ref 3.87–5.11)
RDW: 14.5 % (ref 11.5–15.5)
WBC: 7.5 10*3/uL (ref 4.0–10.5)

## 2015-11-05 LAB — BASIC METABOLIC PANEL
Anion gap: 5 (ref 5–15)
BUN: 11 mg/dL (ref 6–20)
CO2: 29 mmol/L (ref 22–32)
Calcium: 7.9 mg/dL — ABNORMAL LOW (ref 8.9–10.3)
Chloride: 100 mmol/L — ABNORMAL LOW (ref 101–111)
Creatinine, Ser: 0.58 mg/dL (ref 0.44–1.00)
GFR calc Af Amer: >60 mL/min (ref >60)
GFR calc non Af Amer: >60 mL/min (ref >60)
Glucose, Bld: 115 mg/dL — ABNORMAL HIGH (ref 65–99)
Potassium: 4 mmol/L (ref 3.5–5.1)
Sodium: 134 mmol/L — ABNORMAL LOW (ref 135–145)

## 2015-11-05 LAB — PROTIME-INR
INR: 2.07 — ABNORMAL HIGH (ref 0.00–1.49)
Prothrombin Time: 23.1 seconds — ABNORMAL HIGH (ref 11.6–15.2)

## 2015-11-05 LAB — MAGNESIUM: Magnesium: 2 mg/dL (ref 1.7–2.4)

## 2015-11-05 MED ORDER — WARFARIN SODIUM 2 MG PO TABS
2.0000 mg | ORAL_TABLET | Freq: Every day | ORAL | Status: DC
  Administered 2015-11-05: 2 mg via ORAL
  Filled 2015-11-05 (×2): qty 1

## 2015-11-05 MED ORDER — CEFAZOLIN SODIUM-DEXTROSE 2-3 GM-% IV SOLR
2.0000 g | INTRAVENOUS | Status: DC
Start: 2015-11-06 — End: 2015-11-06

## 2015-11-05 MED ORDER — SODIUM CHLORIDE 0.9 % IJ SOLN
10.0000 mL | Freq: Two times a day (BID) | INTRAMUSCULAR | Status: DC
  Administered 2015-11-06 (×2): 10 mL

## 2015-11-05 MED ORDER — SODIUM CHLORIDE 0.9 % IV SOLN
INTRAVENOUS | Status: DC
  Administered 2015-11-06: 250 mL via INTRAVENOUS

## 2015-11-05 MED ORDER — FUROSEMIDE 10 MG/ML IJ SOLN
20.0000 mg | Freq: Once | INTRAMUSCULAR | Status: AC
  Administered 2015-11-05: 20 mg via INTRAVENOUS
  Filled 2015-11-05: qty 2

## 2015-11-05 MED ORDER — SODIUM CHLORIDE 0.9 % IR SOLN: 80.0000 mg | Status: DC

## 2015-11-05 MED ORDER — CHLORHEXIDINE GLUCONATE 4 % EX LIQD
60.0000 mL | Freq: Once | CUTANEOUS | Status: AC
  Administered 2015-11-05: 4 via TOPICAL
  Filled 2015-11-05: qty 60

## 2015-11-05 MED ORDER — SODIUM CHLORIDE 0.9 % IJ SOLN
10.0000 mL | INTRAMUSCULAR | Status: DC | PRN
  Administered 2015-11-08: 10 mL
  Administered 2015-11-08: 20 mL
  Administered 2015-11-09: 10 mL
  Filled 2015-11-05 (×3): qty 40

## 2015-11-05 MED ORDER — CHLORHEXIDINE GLUCONATE 4 % EX LIQD
60.0000 mL | Freq: Once | CUTANEOUS | Status: DC
  Filled 2015-11-05 (×3): qty 60

## 2015-11-05 NOTE — Progress Notes (Signed)
   SUBJECTIVE: Remains quite ill.  Recovering postoperatively.  Marland Kitchen acetaminophen  1,000 mg Oral 4 times per day  . bisacodyl  10 mg Oral Daily   Or  . bisacodyl  10 mg Rectal Daily  . chlorhexidine  15 mL Mouth/Throat BID  . docusate sodium  200 mg Oral Daily  . montelukast  10 mg Oral QHS  . pantoprazole  40 mg Oral Daily  . rosuvastatin  5 mg Oral QODAY  . sodium chloride  3 mL Intravenous Q12H  . sodium chloride  3 mL Intravenous Q12H  . warfarin  2 mg Oral q1800  . Warfarin - Physician Dosing Inpatient   Does not apply q1800   . sodium chloride 250 mL (11/01/15 0654)  . DOPamine 3 mcg/kg/min (11/05/15 1000)    OBJECTIVE: Physical Exam: Filed Vitals:   11/05/15 0700 11/05/15 0800 11/05/15 0900 11/05/15 1000  BP: 102/68 101/69 102/66 92/63  Pulse: 89 89 89 89  Temp:  98.1 F (36.7 C)    TempSrc:  Oral    Resp: 12 12 17  32  Height:      Weight:      SpO2: 95% 93% 93% 98%    Intake/Output Summary (Last 24 hours) at 11/05/15 1143 Last data filed at 11/05/15 1000  Gross per 24 hour  Intake 1024.8 ml  Output   2835 ml  Net -1810.2 ml    Telemetry reveals AV pacing  GEN- The patient is ill appearing, alert and oriented x 3 today.   Head- normocephalic, atraumatic Eyes-  Sclera clear, conjunctiva pink Ears- hearing intact Oropharynx- clear Neck- supple, L IJ catheter in place Lungs- decreased BS, normal work of breathing Heart- Regular rate and rhythm (paced) GI- soft, NT, ND, + BS Extremities- no clubbing, cyanosis, + dependant edema Skin- no rash or lesion Psych- euthymic mood, full affect Neuro- strength and sensation are intact  LABS: Basic Metabolic Panel:  Recent Labs  11/04/15 0400 11/05/15 0400  NA 133* 134*  K 3.6 4.0  CL 101 100*  CO2 25 29  GLUCOSE 114* 115*  BUN 15 11  CREATININE 0.63 0.58  CALCIUM 7.7* 7.9*  MG  --  2.0   Liver Function Tests: No results for input(s): AST, ALT, ALKPHOS, BILITOT, PROT, ALBUMIN in the last 72  hours. No results for input(s): LIPASE, AMYLASE in the last 72 hours. CBC:  Recent Labs  11/04/15 0400 11/05/15 0400  WBC 8.4 7.5  HGB 9.4* 10.0*  HCT 29.3* 30.1*  MCV 90.7 90.1  PLT 83* 109*    ASSESSMENT AND PLAN:  1. Sick sinus/ afb/ AV block Remains pacer dependant Underlying rhythm is afib with no escape.  V threshold has increased to 8 mA.  The patient has symptomatic AV block which is unlikely to improve.  I would therefore recommend pacemaker.  Risks, benefits, alternatives to pacemaker implantation were discussed in detail with the patient today. The patient understands that the risks include but are not limited to bleeding, infection, pneumothorax, perforation, tamponade, vascular damage, renal failure, MI, stroke, death,  and lead dislodgement and wishes to proceed. We will therefore schedule the procedure for tomorrow with Dr Lovena Le.  Orders placed Npo after midnight Remove CVL today if ok with Dr Roxy Manns  2. afib Stable  Thompson Grayer, MD 11/05/2015 11:43 AM

## 2015-11-05 NOTE — Progress Notes (Signed)
Peripherally Inserted Central Catheter/Midline Placement  The IV Nurse has discussed with the patient and/or persons authorized to consent for the patient, the purpose of this procedure and the potential benefits and risks involved with this procedure.  The benefits include less needle sticks, lab draws from the catheter and patient may be discharged home with the catheter.  Risks include, but not limited to, infection, bleeding, blood clot (thrombus formation), and puncture of an artery; nerve damage and irregular heat beat.  Alternatives to this procedure were also discussed.  PICC/Midline Placement Documentation  PICC Double Lumen 11/05/15 PICC Right Brachial 35 cm 0 cm (Active)  Indication for Insertion or Continuance of Line Prolonged intravenous therapies 11/05/2015  3:48 PM  Exposed Catheter (cm) 0 cm 11/05/2015  3:48 PM  Site Assessment Clean;Dry;Intact 11/05/2015  3:48 PM  Lumen #1 Status Flushed;Saline locked;Blood return noted 11/05/2015  3:48 PM  Lumen #2 Status Flushed;Saline locked;Blood return noted 11/05/2015  3:48 PM  Dressing Type Transparent 11/05/2015  3:48 PM  Dressing Status Clean;Dry;Intact 11/05/2015  3:48 PM  Dressing Change Due 11/12/15 11/05/2015  3:48 PM       Gordan Payment 11/05/2015, 3:50 PM

## 2015-11-05 NOTE — Progress Notes (Signed)
      [address]       [place] [address]             [phone]        CARDIOTHORACIC SURGERY PROGRESS NOTE   R5 Days Post-Op Procedure(s) (LRB): MINIMALLY INVASIVE MAZE PROCEDURE (N/A) TRANSESOPHAGEAL ECHOCARDIOGRAM (TEE) (N/A) MINIMALLY INVASIVE MITRAL VALVE (MV) REPLACEMENT (Right)  Subjective: Feels better.  Had a good night and ambulated some this morning  Objective: Vital signs: BP Readings from Last 1 Encounters:  11/05/15 92/63   Pulse Readings from Last 1 Encounters:  11/05/15 89   Resp Readings from Last 1 Encounters:  11/05/15 32   Temp Readings from Last 1 Encounters:  11/05/15 98.1 F (36.7 C) Oral    Hemodynamics:    Physical Exam:  Rhythm:   AV paced - remains very bradycardic underneath and V-pacing wire threshold increased to 8 mV  Breath sounds: clear  Heart sounds:  RRR w/out murmur  Incisions:  Clean and dry  Abdomen:  Soft, non-distended, non-tender  Extremities:  Warm, well-perfused  Chest tubes:  Decreasing volume thin serosanguinous output, no air leak   Intake/Output from previous day: 12/03 0701 - 12/04 0700 In: 1285.5 [P.O.:910; I.V.:275.5; IV Piggyback:100] Out: 2885 [Urine:2575; Chest Tube:310] Intake/Output this shift: Total I/O In: 319.8 [P.O.:240; I.V.:79.8] Out: 490 [Urine:450; Chest Tube:40]  Lab Results:  CBC: Recent Labs  11/04/15 0400 11/05/15 0400  WBC 8.4 7.5  HGB 9.4* 10.0*  HCT 29.3* 30.1*  PLT 83* 109*    BMET:  Recent Labs  11/04/15 0400 11/05/15 0400  NA 133* 134*  K 3.6 4.0  CL 101 100*  CO2 25 29  GLUCOSE 114* 115*  BUN 15 11  CREATININE 0.63 0.58  CALCIUM 7.7* 7.9*     PT/INR:   Recent Labs  11/05/15 0400  LABPROT 23.1*  INR 2.07*    CBG (last 3)  No results for input(s): GLUCAP in the last 72 hours.  ABG    Component Value Date/Time   PHART 7.226* 11/01/2015 0941   PCO2ART 59.5* 11/01/2015 0941   PO2ART 301.0* 11/01/2015 0941   HCO3 24.7* 11/01/2015  0941   TCO2 22 11/01/2015 1710   ACIDBASEDEF 4.0* 11/01/2015 0941   O2SAT 100.0 11/01/2015 0941    CXR: n/a  Assessment/Plan: S/P Procedure(s) (LRB): MINIMALLY INVASIVE MAZE PROCEDURE (N/A) TRANSESOPHAGEAL ECHOCARDIOGRAM (TEE) (N/A) MINIMALLY INVASIVE MITRAL VALVE (MV) REPLACEMENT (Right)  Overall stable POD5 although still pacer-dependent and V-wire threshold has increased BP stable off Neo drip although still on low dose dopamine, weaning slowly Expected post op acute blood loss anemia, Hgb up to 10 today Expected post op volume excess, UOP adequate, weight stable 5-7 kg above pre-op baseline Expected post op atelectasis, R>L Post op thrombocytopenia with h/o chronic thrombocytopenia pre-op, platelet count up to 109k INR stable 2.1  Chronic migraine headaches Chronic anxiety   Needs permanent pacer soon  Wean dopamine as tolerated  Keep in SICU until she gets permanent pacer   Leave chest tubes until output decreases further  Resume low dose coumadin today  Insert PICC line so that left IJ central line can be removed as requested by Dr Rayann Heman  Rexene Alberts, MD 11/05/2015 11:40 AM

## 2015-11-06 ENCOUNTER — Encounter (HOSPITAL_COMMUNITY)
Admission: RE | Disposition: A | Discharge status: Skilled Nursing Facility | Payer: Medicare Other | Source: Ambulatory Visit | Attending: Thoracic Surgery (Cardiothoracic Vascular Surgery)

## 2015-11-06 ENCOUNTER — Encounter (HOSPITAL_COMMUNITY): Payer: Self-pay | Admitting: Internal Medicine

## 2015-11-06 DIAGNOSIS — I442 Atrioventricular block, complete: Secondary | ICD-10-CM

## 2015-11-06 DIAGNOSIS — Z950 Presence of cardiac pacemaker: Secondary | ICD-10-CM | POA: Insufficient documentation

## 2015-11-06 HISTORY — PX: EP IMPLANTABLE DEVICE: SHX172B

## 2015-11-06 LAB — CBC
HCT: 29.2 % — ABNORMAL LOW (ref 36.0–46.0)
Hemoglobin: 9.9 g/dL — ABNORMAL LOW (ref 12.0–15.0)
MCH: 30.5 pg (ref 26.0–34.0)
MCHC: 33.9 g/dL (ref 30.0–36.0)
MCV: 89.8 fL (ref 78.0–100.0)
Platelets: 124 10*3/uL — ABNORMAL LOW (ref 150–400)
RBC: 3.25 MIL/uL — ABNORMAL LOW (ref 3.87–5.11)
RDW: 14.7 % (ref 11.5–15.5)
WBC: 6.8 10*3/uL (ref 4.0–10.5)

## 2015-11-06 LAB — BASIC METABOLIC PANEL
Anion gap: 5 (ref 5–15)
BUN: 11 mg/dL (ref 6–20)
CO2: 28 mmol/L (ref 22–32)
Calcium: 7.7 mg/dL — ABNORMAL LOW (ref 8.9–10.3)
Chloride: 100 mmol/L — ABNORMAL LOW (ref 101–111)
Creatinine, Ser: 0.62 mg/dL (ref 0.44–1.00)
GFR calc Af Amer: >60 mL/min (ref >60)
GFR calc non Af Amer: >60 mL/min (ref >60)
Glucose, Bld: 103 mg/dL — ABNORMAL HIGH (ref 65–99)
Potassium: 3.9 mmol/L (ref 3.5–5.1)
Sodium: 133 mmol/L — ABNORMAL LOW (ref 135–145)

## 2015-11-06 LAB — PROTIME-INR
INR: 2.48 — ABNORMAL HIGH (ref 0.00–1.49)
Prothrombin Time: 26.6 seconds — ABNORMAL HIGH (ref 11.6–15.2)

## 2015-11-06 SURGERY — PACEMAKER IMPLANT
Anesthesia: LOCAL

## 2015-11-06 MED ORDER — LIDOCAINE HCL (PF) 1 % IJ SOLN
INTRAMUSCULAR | Status: AC
  Filled 2015-11-06: qty 60

## 2015-11-06 MED ORDER — HEPARIN (PORCINE) IN NACL 2-0.9 UNIT/ML-% IJ SOLN
INTRAMUSCULAR | Status: AC
  Filled 2015-11-06: qty 500

## 2015-11-06 MED ORDER — CEFAZOLIN SODIUM 1-5 GM-% IV SOLN
1.0000 g | Freq: Four times a day (QID) | INTRAVENOUS | Status: AC
  Administered 2015-11-06 – 2015-11-07 (×3): 1 g via INTRAVENOUS
  Filled 2015-11-06 (×3): qty 50

## 2015-11-06 MED ORDER — MIDAZOLAM HCL 5 MG/5ML IJ SOLN
INTRAMUSCULAR | Status: DC | PRN
  Administered 2015-11-06 (×2): 1 mg via INTRAVENOUS

## 2015-11-06 MED ORDER — LIDOCAINE HCL (PF) 1 % IJ SOLN
INTRAMUSCULAR | Status: DC | PRN
  Administered 2015-11-06: 11:00:00

## 2015-11-06 MED ORDER — FENTANYL CITRATE (PF) 100 MCG/2ML IJ SOLN
INTRAMUSCULAR | Status: AC
  Filled 2015-11-06: qty 2

## 2015-11-06 MED ORDER — ACETAMINOPHEN 325 MG PO TABS: 325.0000 mg | ORAL_TABLET | ORAL | Status: DC | PRN

## 2015-11-06 MED ORDER — SODIUM CHLORIDE 0.9 % IR SOLN
Status: AC
  Filled 2015-11-06: qty 2

## 2015-11-06 MED ORDER — WARFARIN SODIUM 1 MG PO TABS
1.0000 mg | ORAL_TABLET | Freq: Every day | ORAL | Status: DC
  Administered 2015-11-06 – 2015-11-08 (×3): 1 mg via ORAL
  Filled 2015-11-06 (×3): qty 1

## 2015-11-06 MED ORDER — CEFAZOLIN SODIUM-DEXTROSE 2-3 GM-% IV SOLR
INTRAVENOUS | Status: AC
Start: 2015-11-06 — End: 2015-11-06
  Filled 2015-11-06: qty 50

## 2015-11-06 MED ORDER — CEFAZOLIN SODIUM-DEXTROSE 2-3 GM-% IV SOLR
INTRAVENOUS | Status: DC | PRN
  Administered 2015-11-06: 2 g via INTRAVENOUS

## 2015-11-06 MED ORDER — HEPARIN (PORCINE) IN NACL 2-0.9 UNIT/ML-% IJ SOLN
INTRAMUSCULAR | Status: AC
  Filled 2015-11-06: qty 1000

## 2015-11-06 MED ORDER — ONDANSETRON HCL 4 MG/2ML IJ SOLN: 4.0000 mg | Freq: Four times a day (QID) | INTRAMUSCULAR | Status: DC | PRN

## 2015-11-06 MED ORDER — MIDAZOLAM HCL 5 MG/5ML IJ SOLN
INTRAMUSCULAR | Status: AC
Start: 2015-11-06 — End: 2015-11-06
  Filled 2015-11-06: qty 5

## 2015-11-06 MED ORDER — FENTANYL CITRATE (PF) 100 MCG/2ML IJ SOLN
INTRAMUSCULAR | Status: DC | PRN
  Administered 2015-11-06: 25 ug via INTRAVENOUS

## 2015-11-06 MED FILL — Heparin Sodium (Porcine) Inj 1000 Unit/ML: INTRAMUSCULAR | Qty: 2500 | Status: AC

## 2015-11-06 SURGICAL SUPPLY — 8 items
CABLE SURGICAL S-101-97-12 (CABLE) ×2 IMPLANT
LEAD CAPSURE NOVUS 45CM (Lead) ×2 IMPLANT
LEAD CAPSURE NOVUS 5076-52CM (Lead) ×2 IMPLANT
PACEMAKER ADAPTA DR ADDRL1 (Pacemaker) ×1 IMPLANT
PAD DEFIB LIFELINK (PAD) ×2 IMPLANT
PPM ADAPTA DR ADDRL1 (Pacemaker) ×2 IMPLANT
SHEATH CLASSIC 7F (SHEATH) ×4 IMPLANT
TRAY PACEMAKER INSERTION (PACKS) ×2 IMPLANT

## 2015-11-06 NOTE — Progress Notes (Signed)
      [address]       [place] [address]             [phone]        CARDIOTHORACIC SURGERY PROGRESS NOTE   R6 Days Post-Op Procedure(s) (LRB): MINIMALLY INVASIVE MAZE PROCEDURE (N/A) TRANSESOPHAGEAL ECHOCARDIOGRAM (TEE) (N/A) MINIMALLY INVASIVE MITRAL VALVE (MV) REPLACEMENT (Right)  Subjective: No specific complaints.  Reports a "metallic taste" in mouth.  No pain or SOB  Objective: Vital signs: BP Readings from Last 1 Encounters:  11/06/15 97/56   Pulse Readings from Last 1 Encounters:  11/06/15 81   Resp Readings from Last 1 Encounters:  11/06/15 14   Temp Readings from Last 1 Encounters:  11/06/15 98.2 F (36.8 C) Oral    Hemodynamics:    Physical Exam:  Rhythm:   AV paced  Breath sounds: clear  Heart sounds:  RRR w/out murmur  Incisions:  Clean and dry  Abdomen:  Soft, non-distended, non-tender  Extremities:  Warm, well-perfused  Chest tubes:  Decreasing volume thin serosanguinous output, no air leak    Intake/Output from previous day: 12/04 0701 - 12/05 0700 In: 1197 [P.O.:600; I.V.:597] Out: 2740 [Urine:2500; Chest Tube:240] Intake/Output this shift:    Lab Results:  CBC: Recent Labs  11/05/15 0400 11/06/15 0400  WBC 7.5 6.8  HGB 10.0* 9.9*  HCT 30.1* 29.2*  PLT 109* 124*    BMET:  Recent Labs  11/05/15 0400 11/06/15 0400  NA 134* 133*  K 4.0 3.9  CL 100* 100*  CO2 29 28  GLUCOSE 115* 103*  BUN 11 11  CREATININE 0.58 0.62  CALCIUM 7.9* 7.7*     PT/INR:   Recent Labs  11/06/15 0400  LABPROT 26.6*  INR 2.48*    CBG (last 3)  No results for input(s): GLUCAP in the last 72 hours.  ABG    Component Value Date/Time   PHART 7.226* 11/01/2015 0941   PCO2ART 59.5* 11/01/2015 0941   PO2ART 301.0* 11/01/2015 0941   HCO3 24.7* 11/01/2015 0941   TCO2 22 11/01/2015 1710   ACIDBASEDEF 4.0* 11/01/2015 0941   O2SAT 100.0 11/01/2015 0941    CXR: n/a  Assessment/Plan: S/P Procedure(s)  (LRB): MINIMALLY INVASIVE MAZE PROCEDURE (N/A) TRANSESOPHAGEAL ECHOCARDIOGRAM (TEE) (N/A) MINIMALLY INVASIVE MITRAL VALVE (MV) REPLACEMENT (Right)  Overall stable POD6 although still pacer-dependent and V-wire threshold has increased BP stable although still on low dose dopamine, weaning slowly Expected post op acute blood loss anemia, Hgb stable 9.9 today Expected post op volume excess, diuresing some, weight trending down now 3 kg above pre-op baseline Expected post op atelectasis, R>L Post op thrombocytopenia with h/o chronic thrombocytopenia pre-op, platelet count up to 124k Coumadin anticoagulation, INR up to 2.5 Chronic migraine headaches Chronic anxiety   Needs permanent pacer soon  Wean dopamine as tolerated  Leave chest tubes until temporary pacing wires removed  Decrease coumadin 1 mg today  Transfer step down once permanent pacer in place and Dopamine off  Rexene Alberts, MD 11/06/2015 8:59 AM

## 2015-11-06 NOTE — Clinical Documentation Improvement (Signed)
Cardiology Cardiothoracic  Can the diagnosis of CHF be further specified? Conflicting documentation noted in chart. Please see below and render an opinion in next progress note if applicable. Do not document in BPA drop down box; findings belong in progress note. Thanks!    Acuity - Acute, Chronic, Acute on Chronic  Other  Clinically Undetermined  Document any associated diagnoses/conditions  "The patient is a 74 yo woman with mitral valve prolapse and regurgitation who also has acute on chronic heart failure who underwent mitral valve repair and MAZE several days ago". Dr. Lovena Le 11/03/15 Consult Note  "Expected post op volume excess, diuresing some, weight trending down now 3 kg above pre-op baseline". This is found in daily progress notes documented by Cardio-Thoracic Surgeons.  History of Chronic Diastolic Heart Failure documented  Being treated with IV Lasix 20mg  once or twice daily   Please exercise your independent, professional judgment when responding. A specific answer is not anticipated or expected.  Thank You,  Zoila Shutter RN, BSN, [place] [phone]; Cell: [phone]

## 2015-11-06 NOTE — H&P (View-Only) (Signed)
Reason for Consult: sinus bradycardia  Referring Physician: [name] is an 74 y.o. female.   HPI: The patient is a 74 yo woman with mitral valve prolapse and regurgitation who also has acute on chronic heart failure who underwent mitral valve repair and MAZE several days ago. In the interim, she has had bradycardia and is using her temporary PM. She is on no AV nodal or sinus nodal blocking drugs. She has otherwise been stable since surgery.   PMH: Past Medical History  Diagnosis Date  . SVT (supraventricular tachycardia) ([place])   . MVP (mitral valve prolapse)   . Hyperlipidemia   . Idiopathic thrombocytopenic purpura (ITP)   . Hemorrhoid   . IBS (irritable bowel syndrome)   . Migraine   . Severe mitral regurgitation   . RBBB   . Anxiety associated with depression     Prn alprazolam   . Thoracic aorta atherosclerosis ([place])   . Nodule of right lung   . Restrictive lung disease     Mild on PFT & likely cardiac in etiology   . 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 Atrial fibrillation, persistent ([place])     DCCV 08/22/2015  . Acute on chronic diastolic (congestive) heart failure ([place])   . 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 S/P Minimally invasive maze operation for atrial fibrillation 10/31/2015    Complete bilateral atrial lesion set using cryothermy and bipolar radiofrequency ablation with clipping of LA appendage via right mini thoracotomy approach    PSHX: Past Surgical History  Procedure Laterality Date  . Tubal ligation    . Foot surgery    . Varicose vein surgery Right   . Mandible fracture surgery  03-26-13  . Colonoscopy  2003  . Eye surgery Right 2013  . Breast biopsy    . Cardioversion N/A 08/22/2015    Procedure: CARDIOVERSION;  Surgeon: Thayer Headings, MD;  Location: [facility] ENDOSCOPY;  Service:  Cardiovascular;  Laterality: N/A;  . Tee without cardioversion N/A 08/22/2015    Procedure: TRANSESOPHAGEAL ECHOCARDIOGRAM (TEE);  Surgeon: Thayer Headings, MD;  Location: [facility] ENDOSCOPY;  Service: Cardiovascular;  Laterality: N/A;  Darden Dates with cardioversion    . Cardiac catheterization N/A 10/18/2015    Procedure: Right/Left Heart Cath and Coronary Angiography;  Surgeon: Sherren Mocha, MD;  Location: [facility] CV LAB;  Service: Cardiovascular;  Laterality: N/A;  . Minimally invasive maze procedure N/A 10/31/2015    Procedure: MINIMALLY INVASIVE MAZE PROCEDURE;  Surgeon: Rexene Alberts, MD;  Location: [facility];  Service: Open Heart Surgery;  Laterality: N/A;  . Tee without cardioversion N/A 10/31/2015    Procedure: TRANSESOPHAGEAL ECHOCARDIOGRAM (TEE);  Surgeon: Rexene Alberts, MD;  Location: [place];  Service: Open Heart Surgery;  Laterality: N/A;  . Mitral valve replacement Right 10/31/2015    Procedure: MINIMALLY INVASIVE MITRAL VALVE (MV) REPLACEMENT;  Surgeon: Rexene Alberts, MD;  Location: [facility];  Service: Open Heart Surgery;  Laterality: Right;    FAMHX: Family History  Problem Relation Age of Onset  . Hypertension Mother   . Arrhythmia Mother   . Heart failure Mother   . Arrhythmia Brother   . Stroke Brother 65    cerebral hemorrhage, nonsmoker, no HTN  . Stroke Father     from an aneurysm  . Stroke Maternal Aunt 83  cerebral hemorrhage  . Liver cancer Maternal Grandmother   . Atrial fibrillation Son   . Heart attack Neg Hx     Social History:  reports that she has never smoked. She has never used smokeless tobacco. She reports that she does not drink alcohol or use illicit drugs.  Allergies:  Allergies  Allergen Reactions  . Oxycodone-Acetaminophen Other (See Comments)    Other Reaction: migraine  . Meloxicam Other (See Comments)    Severe reflux  . Codeine Other (See Comments)    migraine  . Doxycycline Itching  . Hydrocodone Nausea And Vomiting    MIGRAINE  .  Hydromorphone Nausea And Vomiting  . Oxycodone Other (See Comments)    Severe migraine    Medications: I have reviewed the patient's current medications.  Dg Chest Port 1 View  11/02/2015  CLINICAL DATA:  Maze procedure.  Mitral valve replacement. EXAM: PORTABLE CHEST 1 VIEW COMPARISON:  11/01/2015. FINDINGS: Interim removal Swan-Ganz catheter. Left IJ line and right chest tubes in stable position. Prior mitral valve replacement. Left atrial appendage clip. Dense right base subsegmental atelectasis. No prominent pleural effusion or pneumothorax. Mild right chest wall subcutaneous emphysema . IMPRESSION: 1. Interim removal Swan-Ganz catheter. 2. Remaining lines and tubes in including 2 right chest tubes in stable position. No pneumothorax. 3. Prior mitral valve replacement. Left atrial appendage clip noted. Heart size stable. No evidence of overt congestive heart failure. 4. Dense right base subsegmental atelectasis. Electronically Signed   By: Marcello Moores  Register   On: 11/02/2015 07:43    ROS  As stated in the HPI and negative for all other systems.  Physical Exam  Vitals:Blood pressure 105/67, pulse 80, temperature 98.3 F (36.8 C), temperature source Oral, resp. rate 15, height 5\' 6"  (1.676 m), weight 155 lb 3.3 oz (70.4 kg), SpO2 96 %.  Well appearing 74 yo woman, NAD HEENT: Unremarkable Neck:  6 cm JVD, no thyromegally Lymphatics:  No adenopathy Back:  No CVA tenderness Lungs:  Clear except for basilar rales. HEART:  Regular rate rhythm, no murmurs, no rubs, no clicks Abd:  Flat, positive bowel sounds, no organomegally, no rebound, no guarding Ext:  2 plus pulses, no edema, no cyanosis, no clubbing Skin:  No rashes no nodules Neuro:  CN II through XII intact, motor grossly intact  ECG - AV pacing. Underlying rhythm is sinus brady, less than 30/min.  Assessment/Plan: 1. 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 Baseline RBBB 3. PAF 4. Mitral valve prolapse with  MR, s/p mitral valve repair Rec: She has profound bradycardia. Hopefully this will improved. If her conduction has not recovered by Monday, I would recommend proceeding with PPM. I have discussed the risk/benefits/goals/expectations of the procedure and she wishes to proceed. Will avoid sinus and aV nodal blocking drugs.   Carleene Overlie TaylorMD 11/03/2015, 5:29 PM

## 2015-11-06 NOTE — Progress Notes (Signed)
Orthopedic Tech Progress Note Patient Details:  [name] [name] [birth year] [id number] Patient has arm sling Patient ID: [name] [name], female   DOB: [birth year], 74 y.o.   MRN: [id number]   Braulio Bosch 11/06/2015, 3:23 PM

## 2015-11-06 NOTE — Progress Notes (Signed)
Patient ID: [name] [name], female   DOB: 09-15-1941, 74 y.o.   MRN: [id number] EVENING ROUNDS NOTE :     [address]       [place] [address]             [phone]                 Day of Surgery Procedure(s) (LRB): Pacemaker Implant (N/A)  Total Length of Stay:  LOS: 6 days  BP 111/66 mmHg  Pulse 71  Temp(Src) 98.4 F (36.9 C) (Oral)  Resp 17  Ht 5\' 6"  (1.676 m)  Wt 150 lb 2.1 oz (68.1 kg)  BMI 24.24 kg/m2  SpO2 94%  LMP  (Exact Date)  .Intake/Output      12/04 0701 - 12/05 0700 12/05 0701 - 12/06 0700   P.O. 600    I.V. (mL/kg) 617 (9.1) 173.8 (2.6)   IV Piggyback     Total Intake(mL/kg) 1217 (17.9) 173.8 (2.6)   Urine (mL/kg/hr) 2500 (1.5) 350 (0.5)   Chest Tube 240 (0.1) 70 (0.1)   Total Output 2740 420   Net -1523 -246.2          . sodium chloride 250 mL (11/01/15 0654)  . DOPamine 4 mcg/kg/min (11/06/15 1600)     Lab Results  Component Value Date   WBC 6.8 11/06/2015   HGB 9.9* 11/06/2015   HCT 29.2* 11/06/2015   PLT 124* 11/06/2015   GLUCOSE 103* 11/06/2015   CHOL 176 02/02/2015   TRIG 54.0 02/02/2015   HDL 65.30 02/02/2015   LDLDIRECT 132.5 03/25/2013   LDLCALC 100* 02/02/2015   ALT 30 10/27/2015   AST 30 10/27/2015   NA 133* 11/06/2015   K 3.9 11/06/2015   CL 100* 11/06/2015   CREATININE 0.62 11/06/2015   BUN 11 11/06/2015   CO2 28 11/06/2015   TSH 2.12 08/01/2014   INR 2.48* 11/06/2015   HGBA1C 5.7* 10/27/2015   Stable Pacer placed today, functioning   Grace Isaac MD  [facility] [phone] Office [phone] 11/06/2015 5:19 PM

## 2015-11-06 NOTE — Interval H&P Note (Signed)
History and Physical Interval Note:  11/06/2015 8:05 AM  [name] [name]  has presented today for surgery, with the diagnosis of sick sinus syndrome  The various methods of treatment have been discussed with the patient and family. After consideration of risks, benefits and other options for treatment, the patient has consented to  Procedure(s): Pacemaker Implant (N/A) as a surgical intervention .  The patient's history has been reviewed, patient examined, no change in status, stable for surgery.  I have reviewed the patient's chart and labs.  Questions were answered to the patient's satisfaction.     Mikle Bosworth.D.

## 2015-11-06 NOTE — Progress Notes (Signed)
SUBJECTIVE: The patient feels "yucky", but denies any anginal pain or SOB, uncomfortable with the chest tube.     . bisacodyl  10 mg Oral Daily   Or  . bisacodyl  10 mg Rectal Daily  .  ceFAZolin (ANCEF) IV  2 g Intravenous On Call  . chlorhexidine  60 mL Topical Once  . chlorhexidine  15 mL Mouth/Throat BID  . docusate sodium  200 mg Oral Daily  . gentamicin irrigation  80 mg Irrigation On Call  . montelukast  10 mg Oral QHS  . pantoprazole  40 mg Oral Daily  . rosuvastatin  5 mg Oral QODAY  . sodium chloride  10-40 mL Intracatheter Q12H  . sodium chloride  3 mL Intravenous Q12H  . sodium chloride  3 mL Intravenous Q12H  . warfarin  2 mg Oral q1800  . Warfarin - Physician Dosing Inpatient   Does not apply q1800   . sodium chloride 250 mL (11/01/15 0654)  . sodium chloride 10 mL/hr (11/06/15 0700)  . DOPamine 5 mcg/kg/min (11/06/15 0600)    OBJECTIVE: Physical Exam: Filed Vitals:   11/06/15 0615 11/06/15 0630 11/06/15 0645 11/06/15 0700  BP:  100/62  97/56  Pulse: 80 79 80 81  Temp:      TempSrc:      Resp: 24 18 18 14   Height:      Weight:      SpO2: 95% 95% 93% 94%    Intake/Output Summary (Last 24 hours) at 11/06/15 0713 Last data filed at 11/06/15 0600  Gross per 24 hour  Intake   1197 ml  Output   2740 ml  Net  -1543 ml    Telemetry reveals paced rhythm  GEN- The patient is well appearing, alert and oriented x 3 today.   Head- normocephalic, atraumatic Eyes-  Sclera clear, conjunctiva pink Ears- hearing intact Oropharynx- clear Neck- supple, no JVP Lungs- Clear to ausculation bilaterally, limited inspiratory effort,  Chest tube R Heart- Regular rate and rhythm, no significant murmurs, no rubs or gallops GI- soft, NT, ND, + BS Extremities- no clubbing, cyanosis, or edema Skin- no rash or lesion.  Surgical dressing R chest dry Psych- euthymic mood, full affect Neuro- no gross deficits appreciated  LABS: Basic Metabolic Panel:  Recent Labs  11/05/15 0400 11/06/15 0400  NA 134* 133*  K 4.0 3.9  CL 100* 100*  CO2 29 28  GLUCOSE 115* 103*  BUN 11 11  CREATININE 0.58 0.62  CALCIUM 7.9* 7.7*  MG 2.0  --    CBC:  Recent Labs  11/05/15 0400 11/06/15 0400  WBC 7.5 6.8  HGB 10.0* 9.9*  HCT 30.1* 29.2*  MCV 90.1 89.8  PLT 109* 124*    RADIOLOGY: Dg Chest Port 1 View 11/05/2015  CLINICAL DATA:  PICC placement EXAM: PORTABLE CHEST 1 VIEW COMPARISON:  Chest radiograph from one day prior. FINDINGS: Right right rotated chest radiograph. Right PICC terminates at the cavoatrial junction. Mitral valve prosthesis, right apical chest tube and mediastinal drain are stable in configuration. Stable cardiomediastinal silhouette with mild cardiomegaly. No pneumothorax. Stable small left pleural effusion. Stable mild pulmonary edema. Patchy bibasilar lung opacities appear unchanged. IMPRESSION: 1. Right PICC terminates at the cavoatrial junction. 2. Stable mild congestive heart failure. 3. Stable small left pleural effusion. 4. Stable patchy bibasilar lung opacities likely representing atelectasis. 5. No pneumothorax. Electronically Signed   By: Ilona Sorrel M.D.   On: 11/05/2015 16:58      ASSESSMENT  AND PLAN:  Principal Problem:   S/P minimally invasive mitral valve replacement with bioprosthetic valve + maze procedure Active Problems:   Mitral valve prolapse   RBBB   Anxiety associated with depression   Thrombocytopenia, unspecified ([facility])   Pulmonary hypertension ([facility])   Mitral regurgitation   Restrictive lung disease   Severe mitral regurgitation   Atrial fibrillation, persistent ([facility])   S/P Minimally invasive maze operation for atrial fibrillation  1. Sick sinus/ AV block Remains pacer dependant. Temp wires/external pacer Planned for PPM today  2. S/p minimally invasive MVR/MAZE is POD #6 Inter-op TEE LVEF 65% p/o anemia On dopamine gtt (off neo)  3. PAFib s/p MAZE (DCCV Sept 2016) CHADS2Vasc is at least 3 on Eliquis  out patient, Warfarin here, therapeutic at 2.48 this AM   Tommye Standard, PA-C 11/06/2015 7:13 AM   EP Attending  Patient seen and examined. Agree with above.   Mikle Bosworth.D.

## 2015-11-07 ENCOUNTER — Encounter (HOSPITAL_COMMUNITY): Payer: Self-pay | Admitting: Physician Assistant

## 2015-11-07 ENCOUNTER — Inpatient Hospital Stay (HOSPITAL_COMMUNITY): Payer: Medicare Other

## 2015-11-07 DIAGNOSIS — Z953 Presence of xenogenic heart valve: Secondary | ICD-10-CM

## 2015-11-07 DIAGNOSIS — I481 Persistent atrial fibrillation: Secondary | ICD-10-CM

## 2015-11-07 LAB — CBC
HCT: 29.5 % — ABNORMAL LOW (ref 36.0–46.0)
Hemoglobin: 9.7 g/dL — ABNORMAL LOW (ref 12.0–15.0)
MCH: 29.6 pg (ref 26.0–34.0)
MCHC: 32.9 g/dL (ref 30.0–36.0)
MCV: 89.9 fL (ref 78.0–100.0)
Platelets: 137 10*3/uL — ABNORMAL LOW (ref 150–400)
RBC: 3.28 MIL/uL — ABNORMAL LOW (ref 3.87–5.11)
RDW: 15 % (ref 11.5–15.5)
WBC: 6.4 10*3/uL (ref 4.0–10.5)

## 2015-11-07 LAB — PROTIME-INR
INR: 2.86 — ABNORMAL HIGH (ref 0.00–1.49)
Prothrombin Time: 29.5 seconds — ABNORMAL HIGH (ref 11.6–15.2)

## 2015-11-07 LAB — BASIC METABOLIC PANEL
Anion gap: 3 — ABNORMAL LOW (ref 5–15)
BUN: 6 mg/dL (ref 6–20)
CO2: 26 mmol/L (ref 22–32)
Calcium: 7.6 mg/dL — ABNORMAL LOW (ref 8.9–10.3)
Chloride: 105 mmol/L (ref 101–111)
Creatinine, Ser: 0.61 mg/dL (ref 0.44–1.00)
GFR calc Af Amer: >60 mL/min (ref >60)
GFR calc non Af Amer: >60 mL/min (ref >60)
Glucose, Bld: 95 mg/dL (ref 65–99)
Potassium: 3.8 mmol/L (ref 3.5–5.1)
Sodium: 134 mmol/L — ABNORMAL LOW (ref 135–145)

## 2015-11-07 MED ORDER — POTASSIUM CHLORIDE CRYS ER 20 MEQ PO TBCR
20.0000 meq | EXTENDED_RELEASE_TABLET | Freq: Every day | ORAL | Status: DC
  Administered 2015-11-08 – 2015-11-09 (×2): 20 meq via ORAL
  Filled 2015-11-07 (×3): qty 1

## 2015-11-07 MED ORDER — POTASSIUM CHLORIDE 10 MEQ/50ML IV SOLN
10.0000 meq | INTRAVENOUS | Status: AC
  Administered 2015-11-07 (×3): 10 meq via INTRAVENOUS
  Filled 2015-11-07 (×3): qty 50

## 2015-11-07 MED ORDER — FUROSEMIDE 40 MG PO TABS
40.0000 mg | ORAL_TABLET | Freq: Every day | ORAL | Status: DC
  Administered 2015-11-07 – 2015-11-09 (×3): 40 mg via ORAL
  Filled 2015-11-07 (×3): qty 1

## 2015-11-07 MED ORDER — METOPROLOL TARTRATE 12.5 MG HALF TABLET
12.5000 mg | ORAL_TABLET | Freq: Two times a day (BID) | ORAL | Status: DC
  Administered 2015-11-08 – 2015-11-09 (×2): 12.5 mg via ORAL
  Filled 2015-11-07 (×3): qty 1

## 2015-11-07 MED ORDER — AMIODARONE HCL 200 MG PO TABS
200.0000 mg | ORAL_TABLET | Freq: Two times a day (BID) | ORAL | Status: DC
  Administered 2015-11-07 – 2015-11-09 (×5): 200 mg via ORAL
  Filled 2015-11-07 (×5): qty 1

## 2015-11-07 NOTE — Progress Notes (Signed)
[address]       [place] [address]             [phone]        CARDIOTHORACIC SURGERY PROGRESS NOTE  R7 Days Post-Op Procedure(s) (LRB): MINIMALLY INVASIVE MAZE PROCEDURE (N/A) TRANSESOPHAGEAL ECHOCARDIOGRAM (TEE) (N/A) MINIMALLY INVASIVE MITRAL VALVE (MV) REPLACEMENT (Right)   R1 Day Post-Op Procedure(s) (LRB): Pacemaker Implant (N/A)  Subjective: Feels well.  Mild soreness from pacemaker implant.  No SOB.  Dizziness has resolved  Objective: Vital signs: BP Readings from Last 1 Encounters:  11/07/15 95/54   Pulse Readings from Last 1 Encounters:  11/07/15 69   Resp Readings from Last 1 Encounters:  11/07/15 22   Temp Readings from Last 1 Encounters:  11/07/15 98 F (36.7 C) Oral    Hemodynamics:    Physical Exam:  Rhythm:   paced  Breath sounds: Diminished at bases, few inspiratory crackles  Heart sounds:  RRR  Incisions:  Clean and dry  Abdomen:  Soft, non-distended, non-tender  Extremities:  Warm, well-perfused  Chest tubes:  Decreasing volume thin serosanguinous output, no air leak    Intake/Output from previous day: 12/05 0701 - 12/06 0700 In: 542.4 [I.V.:442.4; IV Piggyback:100] Out: [id number] [Urine:2000; Chest Tube:430] Intake/Output this shift:    Lab Results:  CBC: Recent Labs  11/06/15 0400 11/07/15 0503  WBC 6.8 6.4  HGB 9.9* 9.7*  HCT 29.2* 29.5*  PLT 124* 137*    BMET:  Recent Labs  11/06/15 0400 11/07/15 0503  NA 133* 134*  K 3.9 3.8  CL 100* 105  CO2 28 26  GLUCOSE 103* 95  BUN 11 6  CREATININE 0.62 0.61  CALCIUM 7.7* 7.6*     PT/INR:   Recent Labs  11/07/15 0503  LABPROT 29.5*  INR 2.86*    CBG (last 3)  No results for input(s): GLUCAP in the last 72 hours.  ABG    Component Value Date/Time   PHART 7.226* 11/01/2015 0941   PCO2ART 59.5* 11/01/2015 0941   PO2ART 301.0* 11/01/2015 0941   HCO3 24.7* 11/01/2015 0941   TCO2 22 11/01/2015 1710   ACIDBASEDEF 4.0* 11/01/2015 0941     O2SAT 100.0 11/01/2015 0941    CXR: PORTABLE CHEST 1 VIEW  COMPARISON: 11/05/2015 .  FINDINGS: Interim placement of cardiac pacer, lead tips are projected over the right atrium and right ventricle. Right PICC line and right chest tube in stable position. Cardiac valve replacement. Left atrial appendage clip noted. Stable cardiomegaly. Low lung volumes with bibasilar atelectasis and/or infiltrates. Small left pleural effusion. No pneumothorax. Mild right chest wall subcutaneous emphysema.  IMPRESSION: 1. Interim placement of cardiac pacer, lead tips are projected over the right atrium and right ventricle. Prior cardiac valve replacement. Left atrial appendage clip noted. Stable cardiomegaly. 2. Right PICC line and right chest tube in stable position. No pneumothorax. 3. Low lung volumes with mild bibasilar atelectasis and/or infiltrates. Small left pleural effusion . Similar findings noted on prior exam.   Electronically Signed  By: Marcello Moores Register  On: 11/07/2015 07:27  Assessment/Plan:  Overall stable POD7 and POD1 permanent pacer BP stable although still on low dose dopamine Expected post op acute blood loss anemia, Hgb stable 9.7 today Expected post op volume excess, diuresing some, weight trending down now 3 kg above pre-op baseline Expected post op atelectasis, R>L, improved Post op thrombocytopenia with h/o chronic thrombocytopenia pre-op, platelet count up to 137k Coumadin anticoagulation, INR up to 2.8 Chronic  migraine headaches Chronic anxiety   Restart amiodarone  D/C temporary pacing wires  Wean dopamine as tolerated  Gentle Diuresis  Leave chest tubes until temporary pacing wires removed  Continue coumadin 1 mg today and watch closely with amiodarone restarted  Transfer step down once Dopamine off  Rexene Alberts, MD 11/07/2015 8:12 AM

## 2015-11-07 NOTE — Progress Notes (Signed)
SUBJECTIVE: The patient is doing well today.  At this time, she denies chest pain, shortness of breath, or any new concerns. She is in bedside chair, denies pain at [facility] site.  Marland Kitchen amiodarone  200 mg Oral BID  . bisacodyl  10 mg Oral Daily   Or  . bisacodyl  10 mg Rectal Daily  . chlorhexidine  15 mL Mouth/Throat BID  . docusate sodium  200 mg Oral Daily  . furosemide  40 mg Oral Daily  . [START ON 11/08/2015] metoprolol  12.5 mg Oral BID  . montelukast  10 mg Oral QHS  . pantoprazole  40 mg Oral Daily  . potassium chloride  10 mEq Intravenous Q1 Hr x 3  . [START ON 11/08/2015] potassium chloride  20 mEq Oral Daily  . rosuvastatin  5 mg Oral QODAY  . sodium chloride  10-40 mL Intracatheter Q12H  . sodium chloride  3 mL Intravenous Q12H  . sodium chloride  3 mL Intravenous Q12H  . warfarin  1 mg Oral q1800  . Warfarin - Physician Dosing Inpatient   Does not apply q1800   . sodium chloride 250 mL (11/01/15 0654)  . DOPamine Stopped (11/07/15 0815)    OBJECTIVE: Physical Exam: Filed Vitals:   11/07/15 0500 11/07/15 0600 11/07/15 0700 11/07/15 0726  BP: 105/65 95/55 95/54    Pulse: 70 69 69   Temp:    98 F (36.7 C)  TempSrc:    Oral  Resp: 21 23 22    Height:      Weight:  149 lb 7.6 oz (67.8 kg)    SpO2: 92% 97% 94%     Intake/Output Summary (Last 24 hours) at 11/07/15 0853 Last data filed at 11/07/15 0827  Gross per 24 hour  Intake 586.13 ml  Output   2070 ml  Net -1483.87 ml    Telemetry reveals Vpaced rhythm with underlying atrial fib  GEN- The patient is well appearing, alert and oriented x 3 today.   Head- normocephalic, atraumatic Eyes-  Sclera clear, conjunctiva pink Ears- hearing intact Oropharynx- clear Neck- supple, no JVP Lungs- diminished at the bases, normal work of breathing (chest tube R), left PM incision is well healed. Heart- Regular rate and rhythm (paced), no significant murmurs, no rubs or gallops GI- soft, NT, ND, + BS Extremities- no  clubbing, cyanosis, or edema Skin- no rash or lesion Psych- euthymic mood, full affect Neuro- no gross deficits appreciated  LABS: Basic Metabolic Panel:  Recent Labs  11/05/15 0400 11/06/15 0400 11/07/15 0503  NA 134* 133* 134*  K 4.0 3.9 3.8  CL 100* 100* 105  CO2 29 28 26   GLUCOSE 115* 103* 95  BUN 11 11 6   CREATININE 0.58 0.62 0.61  CALCIUM 7.9* 7.7* 7.6*  MG 2.0  --   --    CBC:  Recent Labs  11/06/15 0400 11/07/15 0503  WBC 6.8 6.4  HGB 9.9* 9.7*  HCT 29.2* 29.5*  MCV 89.8 89.9  PLT 124* 137*    RADIOLOGY: Dg Chest Port 1 View 11/07/2015  CLINICAL DATA:  Cardiac pacer. EXAM: PORTABLE CHEST 1 VIEW COMPARISON:  11/05/2015 . FINDINGS: Interim placement of cardiac pacer, lead tips are projected over the right atrium and right ventricle. Right PICC line and right chest tube in stable position. Cardiac valve replacement. Left atrial appendage clip noted. Stable cardiomegaly. Low lung volumes with bibasilar atelectasis and/or infiltrates. Small left pleural effusion. No pneumothorax. Mild right chest wall subcutaneous emphysema. IMPRESSION: 1.  Interim placement of cardiac pacer, lead tips are projected over the right atrium and right ventricle. Prior cardiac valve replacement. Left atrial appendage clip noted. Stable cardiomegaly. 2. Right PICC line and right chest tube in stable position. No pneumothorax. 3. Low lung volumes with mild bibasilar atelectasis and/or infiltrates. Small left pleural effusion . Similar findings noted on prior exam. Electronically Signed   By: [place]   On: 11/07/2015 07:27    DEVICE: Medtronic Adapta L (serial number [id number]) pacemaker, Medtronic [id number] (serial number [id number]) right atrial lead and a Medtronic [id number] (serial number [id number]) right ventricular lead  Are both working normally.  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 Sick sinus/ AV block Post-op remained pacer dependent via external pacer/epicardial wires Now is s/p PPM yesterday, MDT device with interogation demonstrating normal function. Post implant f/u apt made for the patient, LUE restrictions and wound care discussed with patient Device checked this morning and functioning normally CXR today without pneumothorax Remove outer dressing from pacer site when ready for discharge  2. S/p minimally invasive MVR/MAZE is POD #6 Inter-op TEE LVEF 65% p/o anemia Right sided chest tubes On dopamine gtt (off neo) Continue as per surgery  3. PAFib s/p MAZE (DCCV Sept 2016) CHADS2Vasc is at least 3 on Eliquis out patient, Warfarin here, continue as per primary care team, ok from EP perspective to restart systemic anticoagulation with Eliquis.   Continue care with the primary care team, EP remains available if needed.  Tommye Standard, PA-C 11/07/2015 8:53 AM  EP Attending Patient seen and examined. I have made minimal amendments to the note of Tommye Standard, PA-C as above. 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 Please call us for questions. She will followup with [place] in 2 weeks.  Mikle Bosworth.D.

## 2015-11-07 NOTE — Progress Notes (Signed)
      [address]       [place] [address]             [phone]       Stable day  BP 96/54 mmHg  Pulse 71  Temp(Src) 98.4 F (36.9 C) (Oral)  Resp 22  Ht 5\' 6"  (1.676 m)  Wt 149 lb 7.6 oz (67.8 kg)  BMI 24.14 kg/m2  SpO2 97%  LMP  (Exact Date)   Intake/Output Summary (Last 24 hours) at 11/07/15 1711 Last data filed at 11/07/15 1100  Gross per 24 hour  Intake  552.3 ml  Output   2230 ml  Net -1677.7 ml    EPW removed  Remo Lipps C. Roxan Hockey, MD [facility] [phone]

## 2015-11-08 LAB — COMPREHENSIVE METABOLIC PANEL
ALT: 24 U/L (ref 14–54)
AST: 34 U/L (ref 15–41)
Albumin: 2.5 g/dL — ABNORMAL LOW (ref 3.5–5.0)
Alkaline Phosphatase: 59 U/L (ref 38–126)
Anion gap: 4 — ABNORMAL LOW (ref 5–15)
BUN: 9 mg/dL (ref 6–20)
CO2: 26 mmol/L (ref 22–32)
Calcium: 7.5 mg/dL — ABNORMAL LOW (ref 8.9–10.3)
Chloride: 103 mmol/L (ref 101–111)
Creatinine, Ser: 0.7 mg/dL (ref 0.44–1.00)
GFR calc Af Amer: >60 mL/min (ref >60)
GFR calc non Af Amer: >60 mL/min (ref >60)
Glucose, Bld: 100 mg/dL — ABNORMAL HIGH (ref 65–99)
Potassium: 3.8 mmol/L (ref 3.5–5.1)
Sodium: 133 mmol/L — ABNORMAL LOW (ref 135–145)
Total Bilirubin: 0.8 mg/dL (ref 0.3–1.2)
Total Protein: 4.4 g/dL — ABNORMAL LOW (ref 6.5–8.1)

## 2015-11-08 LAB — PROTIME-INR
INR: 2.46 — ABNORMAL HIGH (ref 0.00–1.49)
Prothrombin Time: 26.4 seconds — ABNORMAL HIGH (ref 11.6–15.2)

## 2015-11-08 LAB — CBC
HCT: 26.9 % — ABNORMAL LOW (ref 36.0–46.0)
Hemoglobin: 9.2 g/dL — ABNORMAL LOW (ref 12.0–15.0)
MCH: 30.5 pg (ref 26.0–34.0)
MCHC: 34.2 g/dL (ref 30.0–36.0)
MCV: 89.1 fL (ref 78.0–100.0)
Platelets: 131 10*3/uL — ABNORMAL LOW (ref 150–400)
RBC: 3.02 MIL/uL — ABNORMAL LOW (ref 3.87–5.11)
RDW: 15.4 % (ref 11.5–15.5)
WBC: 6.6 10*3/uL (ref 4.0–10.5)

## 2015-11-08 MED FILL — Sodium Chloride Irrigation Soln 0.9%: Qty: 500 | Status: AC

## 2015-11-08 MED FILL — Gentamicin Sulfate Inj 40 MG/ML: INTRAMUSCULAR | Qty: 2 | Status: AC

## 2015-11-08 NOTE — Evaluation (Signed)
Physical Therapy Evaluation Patient Details Name: [name] [name] MRN: [id number] DOB: May 23, 1941 Today's Date: 11/08/2015   History of Present Illness  pt is a 74 y/o female with h/o MVP, IBS, afib, admitted for surgical management of severely calcified Mitral valve with reguritation, s/p minimally invasive maze procedure MVR.  Clinical Impression  Pt admitted with/for MVR by Maze procedure.  Pt currently limited functionally due to the problems listed. ( See problems list.)   Pt will benefit from PT to maximize function and safety in order to get ready for next venue listed below.     Follow Up Recommendations SNF    Equipment Recommendations  None recommended by PT    Recommendations for Other Services       Precautions / Restrictions Precautions Precautions: Fall;ICD/Pacemaker (minimal risk) Restrictions Weight Bearing Restrictions: Yes (sternal) LUE Weight Bearing: Non weight bearing      Mobility  Bed Mobility               General bed mobility comments: up in the chair  Transfers Overall transfer level: Needs assistance Equipment used: Rolling walker (2 wheeled) Transfers: Sit to/from Stand Sit to Stand: Min assist         General transfer comment: practice standing without use of UE, due to pacer and CT pain/prec.  Ambulation/Gait Ambulation/Gait assistance: Supervision Ambulation Distance (Feet): 190 Feet Assistive device: Rolling walker (2 wheeled) Gait Pattern/deviations: Step-through pattern     General Gait Details: steady with RW, quick to fatigue.  SpO2 at 93% and EHR 90's to one instance tachy  Stairs            Wheelchair Mobility    Modified Rankin (Stroke Patients Only)       Balance Overall balance assessment: Needs assistance   Sitting balance-Leahy Scale: Fair     Standing balance support: Bilateral upper extremity supported Standing balance-Leahy Scale: Fair                               Pertinent  Vitals/Pain Pain Assessment: Faces Faces Pain Scale: Hurts whole lot Pain Location: R breast/chest tube site Pain Descriptors / Indicators: Grimacing;Guarding;Sore Pain Intervention(s): Monitored during session    Home Living Family/patient expects to be discharged to:: Skilled nursing facility Living Arrangements: Alone Available Help at Discharge: Available PRN/intermittently;Family Type of Home: House Home Access: Level entry     Home Layout: One level Home Equipment: Walker - 2 wheels      Prior Function Level of Independence: Independent               Hand Dominance        Extremity/Trunk Assessment   Upper Extremity Assessment:  (functional, but not tested)           Lower Extremity Assessment: Overall WFL for tasks assessed (proximal weakness, lower truncal weakness)         Communication   Communication: No difficulties  Cognition Arousal/Alertness: Awake/alert Behavior During Therapy: WFL for tasks assessed/performed Overall Cognitive Status: Within Functional Limits for tasks assessed                      General Comments General comments (skin integrity, edema, etc.): VSS good    Exercises        Assessment/Plan    PT Assessment Patient needs continued PT services  PT Diagnosis Generalized weakness (proximal weakness)   PT Problem List Decreased strength;Decreased activity tolerance;Decreased  balance;Decreased knowledge of use of DME;Decreased knowledge of precautions;Pain  PT Treatment Interventions Gait training;Functional mobility training;Therapeutic activities;Therapeutic exercise;Balance training;Patient/family education;DME instruction   PT Goals (Current goals can be found in the Care Plan section) Acute Rehab PT Goals Patient Stated Goal: get rehab and go home independent PT Goal Formulation: With patient Time For Goal Achievement: 11/08/15 Potential to Achieve Goals: Good    Frequency Min 3X/week   Barriers to  discharge Decreased caregiver support      Co-evaluation               End of Session   Activity Tolerance: Patient tolerated treatment well Patient left: in chair;with call bell/phone within reach Nurse Communication: Mobility status         Time: [id number] PT Time Calculation (min) (ACUTE ONLY): 32 min   Charges:   PT Evaluation $Initial PT Evaluation Tier I: 1 Procedure PT Treatments $Gait Training: 8-22 mins   PT G Codes:        Thecla Forgione, Tessie Fass 11/08/2015, 11:33 AM 11/08/2015  Donnella Sham, PT [phone]  (pager)

## 2015-11-08 NOTE — Progress Notes (Signed)
Patient ID: [name], female   DOB: 1941/11/28, 74 y.o.   MRN: [id number]    Patient Name: [name] [name] Date of Encounter: 11/08/2015     Principal Problem:   S/P minimally invasive mitral valve replacement with bioprosthetic valve + maze procedure Active Problems:   Mitral valve prolapse   RBBB   Anxiety associated with depression   Thrombocytopenia, unspecified (HCC)   Pulmonary hypertension (HCC)   Mitral regurgitation   Restrictive lung disease   Severe mitral regurgitation   Atrial fibrillation, persistent (HCC)   S/P Minimally invasive maze operation for atrial fibrillation    SUBJECTIVE  No chest pain or sob.   CURRENT MEDS . amiodarone  200 mg Oral BID  . bisacodyl  10 mg Oral Daily   Or  . bisacodyl  10 mg Rectal Daily  . chlorhexidine  15 mL Mouth/Throat BID  . docusate sodium  200 mg Oral Daily  . furosemide  40 mg Oral Daily  . metoprolol  12.5 mg Oral BID  . montelukast  10 mg Oral QHS  . pantoprazole  40 mg Oral Daily  . potassium chloride  20 mEq Oral Daily  . rosuvastatin  5 mg Oral QODAY  . sodium chloride  10-40 mL Intracatheter Q12H  . sodium chloride  3 mL Intravenous Q12H  . sodium chloride  3 mL Intravenous Q12H  . warfarin  1 mg Oral q1800  . Warfarin - Physician Dosing Inpatient   Does not apply q1800    OBJECTIVE  Filed Vitals:   11/07/15 1800 11/07/15 2035 11/08/15 0437 11/08/15 0700  BP: 101/51 159/61 139/61 97/61  Pulse: 67 71 71   Temp:  97.8 F (36.6 C) 98.1 F (36.7 C)   TempSrc:  Oral Oral   Resp: 19 18 18    Height:      Weight:   149 lb 9.6 oz (67.858 kg)   SpO2:  100% 96%     Intake/Output Summary (Last 24 hours) at 11/08/15 0735 Last data filed at 11/08/15 0600  Gross per 24 hour  Intake 253.75 ml  Output   1030 ml  Net -776.25 ml   Filed Weights   11/06/15 0515 11/07/15 0600 11/08/15 0437  Weight: 150 lb 2.1 oz (68.1 kg) 149 lb 7.6 oz (67.8 kg) 149 lb 9.6 oz (67.858 kg)    PHYSICAL EXAM  General:  Pleasant, NAD. Neuro: Alert and oriented X 3. Moves all extremities spontaneously. Psych: Normal affect. HEENT:  Normal  Neck: Supple without bruits or JVD. Lungs:  Resp regular and unlabored, CTA. Heart: RRR no s3, s4, or murmurs. Abdomen: Soft, non-tender, non-distended, BS + x 4.  Extremities: No clubbing, cyanosis or edema. DP/PT/Radials 2+ and equal bilaterally.  Accessory Clinical Findings  CBC  Recent Labs  11/07/15 0503 11/08/15 0345  WBC 6.4 6.6  HGB 9.7* 9.2*  HCT 29.5* 26.9*  MCV 89.9 89.1  PLT 137* [id number]*   Basic Metabolic Panel  Recent Labs  11/07/15 0503 11/08/15 0345  NA 134* 133*  K 3.8 3.8  CL 105 103  CO2 26 26  GLUCOSE 95 100*  BUN 6 9  CREATININE 0.61 0.70  CALCIUM 7.6* 7.5*   Liver Function Tests  Recent Labs  11/08/15 0345  AST 34  ALT 24  ALKPHOS 59  BILITOT 0.8  PROT 4.4*  ALBUMIN 2.5*   No results for input(s): LIPASE, AMYLASE in the last 72 hours. Cardiac Enzymes No results for input(s): CKTOTAL, CKMB, CKMBINDEX, TROPONINI  in the last 72 hours. BNP Invalid input(s): POCBNP D-Dimer No results for input(s): DDIMER in the last 72 hours. Hemoglobin A1C No results for input(s): HGBA1C in the last 72 hours. Fasting Lipid Panel No results for input(s): CHOL, HDL, LDLCALC, TRIG, CHOLHDL, LDLDIRECT in the last 72 hours. Thyroid Function Tests No results for input(s): TSH, T4TOTAL, T3FREE, THYROIDAB in the last 72 hours.  Invalid input(s): FREET3  TELE  Probable atrial fib with ventricular pacing at 70/min  Radiology/Studies  Dg Chest 2 View  10/27/2015  CLINICAL DATA:  Mitral regurgitation. EXAM: CHEST  2 VIEW COMPARISON:  09/27/2014 . FINDINGS: Mediastinum and hilar structures normal. Cardiomegaly with mild pulmonary vascular prominence. Very mild interstitial prominence noted. Very mild changes of congestive heart failure cannot be excluded . No prominent pleural effusion. No pneumothorax . IMPRESSION: Cardiomegaly with very  mild pulmonary vascular prominence and interstitial prominence. Very mild component congestive heart failure cannot be excluded. Electronically Signed   By: [place]   On: 10/27/2015 10:04   Dg Chest Port 1 View  11/07/2015  CLINICAL DATA:  Cardiac pacer. EXAM: PORTABLE CHEST 1 VIEW COMPARISON:  11/05/2015 . FINDINGS: Interim placement of cardiac pacer, lead tips are projected over the right atrium and right ventricle. Right PICC line and right chest tube in stable position. Cardiac valve replacement. Left atrial appendage clip noted. Stable cardiomegaly. Low lung volumes with bibasilar atelectasis and/or infiltrates. Small left pleural effusion. No pneumothorax. Mild right chest wall subcutaneous emphysema. IMPRESSION: 1. Interim placement of cardiac pacer, lead tips are projected over the right atrium and right ventricle. Prior cardiac valve replacement. Left atrial appendage clip noted. Stable cardiomegaly. 2. Right PICC line and right chest tube in stable position. No pneumothorax. 3. Low lung volumes with mild bibasilar atelectasis and/or infiltrates. Small left pleural effusion . Similar findings noted on prior exam. Electronically Signed   By: [place]   On: 11/07/2015 07:27   Dg Chest Port 1 View  11/05/2015  CLINICAL DATA:  PICC placement EXAM: PORTABLE CHEST 1 VIEW COMPARISON:  Chest radiograph from one day prior. FINDINGS: Right right rotated chest radiograph. Right PICC terminates at the cavoatrial junction. Mitral valve prosthesis, right apical chest tube and mediastinal drain are stable in configuration. Stable cardiomediastinal silhouette with mild cardiomegaly. No pneumothorax. Stable small left pleural effusion. Stable mild pulmonary edema. Patchy bibasilar lung opacities appear unchanged. IMPRESSION: 1. Right PICC terminates at the cavoatrial junction. 2. Stable mild congestive heart failure. 3. Stable small left pleural effusion. 4. Stable patchy bibasilar lung opacities  likely representing atelectasis. 5. No pneumothorax. Electronically Signed   By: Ilona Sorrel M.D.   On: 11/05/2015 16:58   Dg Chest Port 1 View  11/04/2015  CLINICAL DATA:  Follow up atelectasis after recent cardiac surgery. EXAM: PORTABLE CHEST 1 VIEW COMPARISON:  11/02/2015 and 11/01/2015. FINDINGS: 0533 hours. Left IJ central venous catheter and 2 right-sided chest tubes are unchanged in position. The heart size and mediastinal contours are stable status post mitral valve surgery and clipping of the left atrial appendage. Bibasilar atelectasis persists with interval improvement in the right basilar component. There is a small amount of pleural fluid bilaterally. There is soft tissue emphysema within the right chest wall. No evidence of pneumothorax. IMPRESSION: Slight improvement in right basilar aeration. Otherwise stable postoperative appearance of the chest. No evidence of pneumothorax. Electronically Signed   By: Richardean Sale M.D.   On: 11/04/2015 09:38   Dg Chest Port 1 View  11/02/2015  CLINICAL DATA:  Maze procedure.  Mitral valve replacement. EXAM: PORTABLE CHEST 1 VIEW COMPARISON:  11/01/2015. FINDINGS: Interim removal Swan-Ganz catheter. Left IJ line and right chest tubes in stable position. Prior mitral valve replacement. Left atrial appendage clip. Dense right base subsegmental atelectasis. No prominent pleural effusion or pneumothorax. Mild right chest wall subcutaneous emphysema . IMPRESSION: 1. Interim removal Swan-Ganz catheter. 2. Remaining lines and tubes in including 2 right chest tubes in stable position. No pneumothorax. 3. Prior mitral valve replacement. Left atrial appendage clip noted. Heart size stable. No evidence of overt congestive heart failure. 4. Dense right base subsegmental atelectasis. Electronically Signed   By: Marcello Moores  [place]   On: 11/02/2015 07:43   Dg Chest Port 1 View  11/01/2015  CLINICAL DATA:  Minimally invasive Maze procedure EXAM: PORTABLE CHEST 1 VIEW  COMPARISON:  Yesterday FINDINGS: Interval tracheal and esophageal extubation. Lower lung volumes with increased basilar atelectasis. Stable cardiopericardial size and mediastinal contours when allowing for differences in rotation. Changes of mitral valve replacement and left atrial appendage exclusion. Thoracic drains in stable position. Swan-Ganz catheter with tip directed to the right pulmonary artery. Neighboring left IJ central line with tip at the SVC level. No pneumothorax or increasing pleural fluid. IMPRESSION: 1. Lower volumes and increased basilar atelectasis after extubation. 2. Remaining tubes and lines in stable position. 3. No visible pneumothorax. Electronically Signed   By: Monte Fantasia M.D.   On: 11/01/2015 07:38   Dg Chest Port 1 View  10/31/2015  CLINICAL DATA:  75 year old who underwent minimally invasive mitral valve replacement and Maze procedure earlier today. Immediate postoperative examination. EXAM: PORTABLE CHEST 1 VIEW COMPARISON:  Preoperative chest x-ray 10/27/2015 and earlier, including CTA chest of that same date. FINDINGS: Endotracheal tube tip in satisfactory position projecting approximately 4 cm above the carina. Left jugular Swan-Ganz catheter tip projects over the proximal right main pulmonary artery. Left jugular central venous catheter tip projects over the upper SVC. Right chest tube in place with a very small (less than 5%) right apical pneumothorax. Cardiac silhouette moderately enlarged. Mitral valve prosthesis and left atrial appendage clip. Atelectasis involving the right mid lung. Asymmetric streaky opacities in the right upper lobe, likely also related to atelectasis. Pulmonary vascularity normal. IMPRESSION: 1. Support apparatus satisfactory. 2. Right chest tube in place with very small (less than 5%) right apical pneumothorax. 3. Atelectasis involving the right middle lobe. Mild atelectasis involving the right upper lobe. 4. Stable cardiomegaly without  pulmonary edema. Electronically Signed   By: Evangeline Dakin M.D.   On: 10/31/2015 16:11   Ct Angio Chest Aorta W/cm &/or Wo/cm  10/27/2015  CLINICAL DATA:  mitral valve surgery next Tuesday hx -? Aortic aneurysm aortoilical occlussive dz Hx of HTN EXAM: CT ANGIOGRAPHY CHEST, ABDOMEN AND PELVIS TECHNIQUE: Multidetector CT imaging through the chest, abdomen and pelvis was performed using the standard protocol during bolus administration of intravenous contrast. Multiplanar reconstructed images and MIPs were obtained and reviewed to evaluate the vascular anatomy. CONTRAST:  170 mL OMNIPAQUE IOHEXOL 350 MG/ML SOLN COMPARISON:  08/15/2015 and previous FINDINGS: CHEST Noncontrast scout images were not obtained. Right arm IV contrast administration. The SVC is patent. Four-chamber cardiac enlargement. Minimal contrast reflux from the right atrium into the IVC. Dilated central pulmonary arteries. Satisfactory opacification of pulmonary arteries noted, and there is no evidence of pulmonary emboli. There is an accessory superior segment right lower lobe pulmonary vein. Mitral leaflet calcifications. Scattered coronary calcifications. Incomplete opacification of the aorta secondary to scan timing,  with no evidence of dissection, aneurysm, or stenosis. There is classic 3-vessel brachiocephalic arch anatomy without suggestion of proximal stenosis. No pleural or pericardial effusion. No hilar or mediastinal adenopathy. Calcified subpleural granuloma in the anterior right upper lobe. 6 mm subpleural nodule, lateral basal segment right lower lobe image 30/10, stable since previous. 5 mm pleural-based nodule, anterior basal segment right lower lobe image 18. Linear scarring or subsegmental atelectasis medially in the right middle lobe. Lungs otherwise clear. Mild thoracic levoscoliosis, with minimal spurring in the lower thoracic spine. Sternum intact. Review of the MIP images confirms the above findings. ABDOMEN Arterial  findings: Aorta: Incomplete opacification secondary to scan timing. Scattered atheromatous calcifications. Mild tortuosity. No aneurysm, dissection, or stenosis. Celiac axis: Nonocclusive partially calcified ostial plaque, patent distally Superior mesenteric: Nonocclusive partially calcified ostial plaque, patent distally Left renal:          Single, patent Right renal: Single, with partially calcified ostial plaque, no convincing high-grade stenosis, patent distally. Inferior mesenteric: Patent Left iliac: Scattered calcified nonocclusive plaque through the common iliac and in the proximal internal iliac. External iliac is mildly ectatic, patent. Eccentric nonocclusive plaque in the common femoral artery noted. Right iliac: Nonocclusive scattered calcified plaque through the common iliac, in the proximal internal iliac, and in the common femoral artery. External iliac is widely patent. Venous findings: Patent hepatic veins, portal vein, SMV, splenic vein, bilateral renal veins, IVC, and iliac venous system. Review of the MIP images confirms the above findings. Nonvascular findings: Innumerable hepatic cysts versus peliosis, stable. Unremarkable spleen, pancreas. Bilateral renal cysts, largest parapelvic 2.3 cm lower pole on the left, on the right 19 mm midpole. No hydronephrosis or solid renal lesion. Stomach , small bowel, colon nondilated. Normal appendix. Urinary bladder incompletely distended. Uterus and adnexal regions grossly unremarkable. No ascites. No free air. No adenopathy. Lumbar dextroscoliosis apex L1-2 with multilevel spondylitic changes and facet DJD. IMPRESSION: 1. Dilated central pulmonary arteries suggesting pulmonary hypertension. 2. Atherosclerosis, including aortoiliac and coronary artery disease. Please note that although the presence of coronary artery calcium documents the presence of coronary artery disease, the severity of this disease and any potential stenosis cannot be assessed on this  non-gated CT examination. Assessment for potential risk factor modification, dietary therapy or pharmacologic therapy may be warranted, if clinically indicated. 3. Negative for acute PE or aortic aneurysm. 4. Little interval change in hepatic   cysts versus peliosis. Electronically Signed   By: Lucrezia Europe M.D.   On: 10/27/2015 09:48   Ct Angio Abd/pel W/ And/or W/o  10/27/2015  CLINICAL DATA:  mitral valve surgery next Tuesday hx -? Aortic aneurysm aortoilical occlussive dz Hx of HTN EXAM: CT ANGIOGRAPHY CHEST, ABDOMEN AND PELVIS TECHNIQUE: Multidetector CT imaging through the chest, abdomen and pelvis was performed using the standard protocol during bolus administration of intravenous contrast. Multiplanar reconstructed images and MIPs were obtained and reviewed to evaluate the vascular anatomy. CONTRAST:  170 mL OMNIPAQUE IOHEXOL 350 MG/ML SOLN COMPARISON:  08/15/2015 and previous FINDINGS: CHEST Noncontrast scout images were not obtained. Right arm IV contrast administration. The SVC is patent. Four-chamber cardiac enlargement. Minimal contrast reflux from the right atrium into the IVC. Dilated central pulmonary arteries. Satisfactory opacification of pulmonary arteries noted, and there is no evidence of pulmonary emboli. There is an accessory superior segment right lower lobe pulmonary vein. Mitral leaflet calcifications. Scattered coronary calcifications. Incomplete opacification of the aorta secondary to scan timing, with no evidence of dissection, aneurysm, or stenosis. There is classic 3-vessel  brachiocephalic arch anatomy without suggestion of proximal stenosis. No pleural or pericardial effusion. No hilar or mediastinal adenopathy. Calcified subpleural granuloma in the anterior right upper lobe. 6 mm subpleural nodule, lateral basal segment right lower lobe image 30/10, stable since previous. 5 mm pleural-based nodule, anterior basal segment right lower lobe image 18. Linear scarring or subsegmental  atelectasis medially in the right middle lobe. Lungs otherwise clear. Mild thoracic levoscoliosis, with minimal spurring in the lower thoracic spine. Sternum intact. Review of the MIP images confirms the above findings. ABDOMEN Arterial findings: Aorta: Incomplete opacification secondary to scan timing. Scattered atheromatous calcifications. Mild tortuosity. No aneurysm, dissection, or stenosis. Celiac axis: Nonocclusive partially calcified ostial plaque, patent distally Superior mesenteric: Nonocclusive partially calcified ostial plaque, patent distally Left renal:          Single, patent Right renal: Single, with partially calcified ostial plaque, no convincing high-grade stenosis, patent distally. Inferior mesenteric: Patent Left iliac: Scattered calcified nonocclusive plaque through the common iliac and in the proximal internal iliac. External iliac is mildly ectatic, patent. Eccentric nonocclusive plaque in the common femoral artery noted. Right iliac: Nonocclusive scattered calcified plaque through the common iliac, in the proximal internal iliac, and in the common femoral artery. External iliac is widely patent. Venous findings: Patent hepatic veins, portal vein, SMV, splenic vein, bilateral renal veins, IVC, and iliac venous system. Review of the MIP images confirms the above findings. Nonvascular findings: Innumerable hepatic cysts versus peliosis, stable. Unremarkable spleen, pancreas. Bilateral renal cysts, largest parapelvic 2.3 cm lower pole on the left, on the right 19 mm midpole. No hydronephrosis or solid renal lesion. Stomach , small bowel, colon nondilated. Normal appendix. Urinary bladder incompletely distended. Uterus and adnexal regions grossly unremarkable. No ascites. No free air. No adenopathy. Lumbar dextroscoliosis apex L1-2 with multilevel spondylitic changes and facet DJD. IMPRESSION: 1. Dilated central pulmonary arteries suggesting pulmonary hypertension. 2. Atherosclerosis, including  aortoiliac and coronary artery disease. Please note that although the presence of coronary artery calcium documents the presence of coronary artery disease, the severity of this disease and any potential stenosis cannot be assessed on this non-gated CT examination. Assessment for potential risk factor modification, dietary therapy or pharmacologic therapy may be warranted, if clinically indicated. 3. Negative for acute PE or aortic aneurysm. 4. Little interval change in hepatic   cysts versus peliosis. Electronically Signed   By: Lucrezia Europe M.D.   On: 10/27/2015 09:48    ASSESSMENT AND PLAN  1. Complete heart block 2. Atrial fib 3. S/p MV repair/MAZE 4. S/p PPM Rec: her PPM is working normally and we will arrange usual follow up in our office in 2 weeks. Continue current meds. Ok to continue warfarin/amiodarone.  Valeda Corzine,M.D.  11/08/2015 7:35 AM

## 2015-11-08 NOTE — Progress Notes (Addendum)
      [address]       ,[place] [address]             [phone]    R8 Days Post-Op Procedure(s) (LRB): MINIMALLY INVASIVE MAZE PROCEDURE (N/A) TRANSESOPHAGEAL ECHOCARDIOGRAM (TEE) (N/A) MINIMALLY INVASIVE MITRAL VALVE (MV) REPLACEMENT (Right)   2 Days Post-Op Procedure(s) (LRB): Pacemaker Implant (N/A)   Subjective:  [name] [name] has no complaints this morning.  She states she is doing well, but wants to get rid of her chest tube.  + ambulation + BM  Objective: Vital signs in last 24 hours: Temp:  [97.8 F (36.6 C)-98.4 F (36.9 C)] 98.1 F (36.7 C) (12/07 0437) Pulse Rate:  [67-97] 71 (12/07 0437) Cardiac Rhythm:  [-] Ventricular paced (12/07 0719) Resp:  [13-29] 18 (12/07 0437) BP: (84-159)/(45-72) 97/61 mmHg (12/07 0700) SpO2:  [94 %-100 %] 96 % (12/07 0437) Weight:  [149 lb 9.6 oz (67.858 kg)] 149 lb 9.6 oz (67.858 kg) (12/07 0437)  Intake/Output from previous day: 12/06 0701 - 12/07 0700 In: 253.8 [I.V.:103.8; IV Piggyback:150] Out: 1030 [Urine:950; Chest Tube:80]  General appearance: alert, cooperative and no distress Heart: regular rate and rhythm and paced Lungs: clear to auscultation bilaterally Abdomen: soft, non-tender; bowel sounds normal; no masses,  no organomegaly Extremities: edema trace Wound: clean and dry  Lab Results:  Recent Labs  11/07/15 0503 11/08/15 0345  WBC 6.4 6.6  HGB 9.7* 9.2*  HCT 29.5* 26.9*  PLT 137* 131*   BMET:  Recent Labs  11/07/15 0503 11/08/15 0345  NA 134* 133*  K 3.8 3.8  CL 105 103  CO2 26 26  GLUCOSE 95 100*  BUN 6 9  CREATININE 0.61 0.70  CALCIUM 7.6* 7.5*    PT/INR:  Recent Labs  11/08/15 0345  LABPROT 26.4*  INR 2.46*   ABG    Component Value Date/Time   PHART 7.226* 11/01/2015 0941   HCO3 24.7* 11/01/2015 0941   TCO2 22 11/01/2015 1710   ACIDBASEDEF 4.0* 11/01/2015 0941   O2SAT 100.0 11/01/2015 0941   CBG (last 3)  No results for input(s): GLUCAP in the last 72  hours.  Assessment/Plan: S/P Procedure(s) (LRB): Pacemaker Implant (N/A)  1. CV- S/P PPM- remains hemodynamically stable off Dopamine, continue Amiodarone, lopressor 2. Pulm- no acute issues, continue IS.... CT with 80 cc output- will remove today 3. Renal- creatinine okay, weight remains elevated, continue diuresis 4. Chronic Thrombocytopenia- remains stable at 131 5. INR 2.46, continue coumadin at 1 mg daily 6. Dispo- patient stable off Dopamine, S/P PPM, d/c chest tube... Will consult social work for placement as patient lives alone and would like to go to rehab   LOS: 8 days    [name], [name] Panning 11/08/2015  I have seen and examined the patient and agree with the assessment and plan as outlined.  D/C chest tubes.  Possibly ready for d/c in 2-3 days.  Will need short term SNF placement for rehab  Rexene Alberts, MD 11/08/2015 9:19 AM

## 2015-11-08 NOTE — Care Management Important Message (Signed)
Important Message  Patient Details  Name: [name] [name] MRN: [id number] Date of Birth: May 27, 1941   Medicare Important Message Given:  Yes    Nathen May 11/08/2015, 11:42 AM

## 2015-11-08 NOTE — Progress Notes (Signed)
CARDIAC REHAB PHASE I   PRE:  Rate/Rhythm: 69 paced  BP:  Sitting: 98/58        SaO2: 100 RA  MODE:  Ambulation: 200 ft   POST:  Rate/Rhythm: 70 paced  BP:  Sitting: 100/61         SaO2: 100 RA  Pt eager to walk. Pt ambulated 200 ft on RA, rolling walker, assist x1, steady gait, tolerated well. Pt c/o mild DOE, states it is much improved (of note, pt talked the entire walk), denies pain, dizziness, declined rest stop. Pt to recliner after walk, call bell within reach. Will follow.   [id number]  Lenna Sciara, RN, BSN 11/08/2015 1:28 PM

## 2015-11-08 NOTE — Discharge Summary (Signed)
Physician Discharge Summary  Patient ID: MILLANI COPPLE MRN: [id number] DOB/AGE: Oct 09, 1941 74 y.o.  Admit date: 10/31/2015 Discharge date: 11/09/2015   Admission Diagnoses:  Patient Active Problem List   Diagnosis Date Noted  . Atrial fibrillation, persistent ([place])   . Acute on chronic diastolic (congestive) heart failure ([place])   . Severe mitral regurgitation 10/18/2015  . Mitral regurgitation 09/12/2015  . Restrictive lung disease 09/12/2015  . Pulmonary hypertension ([place]) 08/08/2015  . Mandibular fracture ([place]) 08/08/2015  . Nodule of right lung 08/04/2015  . Atrial fibrillation with RVR ([place]) 08/04/2015  . Lumbago 02/05/2015  . Thoracic aorta atherosclerosis ([place]) 02/05/2015  . Left breast mass 02/05/2015  . Family history of cerebral aneurysm 08/02/2014  . Diverticulosis of colon without hemorrhage 01/09/2014  . Thrombocytopenia, unspecified ([place]) 02/04/2013  . IBS (irritable bowel syndrome) 12/31/2012  . History of asbestos exposure 06/27/2012  . Osteoporosis of forearm 06/27/2012  . Anxiety associated with depression 06/25/2012  . Cataract extraction status of right eye 06/25/2012  . RBBB 05/05/2012  . Hyperlipidemia 08/06/2011  . Mitral valve prolapse 05/02/2011  . Palpitations 05/02/2011  . Supraventricular tachycardia ([place]) 05/02/2011     Discharge Diagnoses:   Patient Active Problem List   Diagnosis Date Noted  . 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 S/P Minimally invasive maze operation for atrial fibrillation 10/31/2015  . Atrial fibrillation, persistent ([place])   . Acute on chronic diastolic (congestive) heart failure ([place])   . Severe mitral regurgitation 10/18/2015  . Mitral regurgitation 09/12/2015  . Restrictive lung disease 09/12/2015  . Pulmonary hypertension ([place]) 08/08/2015  . Mandibular fracture ([place]) 08/08/2015  . Nodule of right lung 08/04/2015  . Atrial fibrillation with RVR  ([place]) 08/04/2015  . Lumbago 02/05/2015  . Thoracic aorta atherosclerosis ([place]) 02/05/2015  . Left breast mass 02/05/2015  . Family history of cerebral aneurysm 08/02/2014  . Diverticulosis of colon without hemorrhage 01/09/2014  . Thrombocytopenia, unspecified ([place]) 02/04/2013  . IBS (irritable bowel syndrome) 12/31/2012  . History of asbestos exposure 06/27/2012  . Osteoporosis of forearm 06/27/2012  . Anxiety associated with depression 06/25/2012  . Cataract extraction status of right eye 06/25/2012  . RBBB 05/05/2012  . Hyperlipidemia 08/06/2011  . Mitral valve prolapse 05/02/2011  . Palpitations 05/02/2011  . Supraventricular tachycardia ([place]) 05/02/2011    Discharged Condition: good    History of Present Illness:  Ms. Mahony is a 74 yo white female with long-standing history of mitral valve prolapse, recurrent paroxysmal SVT status post ablation, and recent onset persistent atrial fibrillation associated with acute exacerbation of chronic diastolic congestive heart failure, who has been referred for surgical consultation to discuss the management of severe mitral regurgitation. The patient states that she has been told that she had mitral valve prolapse and a heart murmur for most of her life. She developed recurrent paroxysmal SVT for which she underwent ablation in 2003 and again in 2007. At the time she lived in [place]. In 2008 she moved to [place] to be close to family and reestablish care locally with Dr. Acie Fredrickson who has been following her intermittently ever since. She remained stable and asymptomatic from a cardiac standpoint until this past summer when the patient began to develop symptoms of exertional shortness of breath. Initially she attributed these symptoms to worsening allergies. In August she developed an acute episode of shortness of breath, weakness, and chest tightness while she was traveling in [place]. She was hospitalized for several  days in [place]  where she was diagnosed with atrial fibrillation, mitral regurgitation, and congestive heart failure. She was initially anticoagulated using Lovenox and treated with diltiazem and Lasix. She returned to [facility] and has been followed carefully ever since by Dr. Acie Fredrickson. She was started on coumadin and underwent TEE guided cardioversion on 08/22/2015. TEE revealed bileaflet prolapse with calcified leaflets and moderate-severe mitral regurgitation. Follow-up transthoracic echocardiogram performed 10/10/2015 Confirmed the presence of severely calcified mitral valve leaflets with restricted leaflet mobility and severe mitral regurgitation. Left ventricular size and systolic function remains normal. There is severe left atrial enlargement. The patient underwent left and right heart catheterization by Dr. Burt Knack on 10/18/2015. Catheterization was notable for the absence of significant coronary artery disease and only mild elevation of pulmonary artery pressures but hemodynamic and angiographic findings consistent with severe mitral regurgitation. The patient was referred for elective surgical consultation.  She was initially evaluated by Dr. Roxy Manns on 10/19/2015 at which time he was in agreement the patient should undergo surgical intervention.  The risks and benefits of the procedure were explained to the patient and she was agreeable to proceed.  Surgery was set up for 10/31/2015.  Prior to this she would undergo CTA of the chest, abdomen and pelvis to assess peripheral arteries for cannulation for surgery.      Hospital Course:   Ms. Manners presented to [facility] on 10/31/2015.  She was taken to the operating room and underwent Minimally Invasive Mitral Valve Replacement utilizing a 33 mm Edwards Magna Mitral Bovine prosthetic valve and a Complete MAZE procedure.  She tolerated the procedure well and was taken to the SICU in stable condition.  During her stay in the SICU, the patient was  extubated.  She was weaned off Neo-synephrine and Dopamine as tolerated.  She had bradycardia and required back up pacing.  Her rhythm did not improve and EP consult was obtained.  She had acute exacerbation of chronic diastolic CHF that was difficult to treat initially due to bradycardia and relatively low BP.  She was evaluated by Dr. Lovena Le who felt that if she did not improve she would require placement of permanent pacemaker.  This was done on 11/06/2015 and the patient tolerated without difficulty.  Her pacemaker has been interrogated and is functioning appropriately.  Her temporary pacing wires were removed without difficulty.  She was diuresed successfully.  She has been started on Coumadin for prophylaxis post MAZE procedure.  She was ambulating independently and felt medically stable for transfer to the step down unit.    The patient continues to make progress.  Her INR is trending up appropriately.  She is currently on 1 mg of Coumadin.  Her INR should be 2.0-3.0.  Her chest tube output decreased and they were removed without difficulty.  She continues to ambulate independently.  She does live alone and wants to go to rehab at discharge.  CSW has been consulted to arrange placement. Presently, the patient is medically stable on today's date for transfer once a bed is available.    Consults: cardiology  Significant Diagnostic Studies: ECHO:  - Left ventricle: The cavity size was normal. Systolic function was normal. Wall motion was normal; there were no regional wall motion abnormalities. - Mitral valve: Calcified annulus. Severely thickened, severely calcified leaflets . Thickening. Mobility was restricted. Prolapse, involving the anterior leaflet and the posterior leaflet. The findings are consistent with moderate to severe stenosis. There was severe regurgitation. Valve area by pressure half-time: 1.49  cm^2. Valve area by continuity equation (using LVOT flow): 0.95  cm^2. Ability to accurately assess the severity of MS due to atrial fibrillation. - Left atrium: The atrium was severely dilated. - Right atrium: The atrium was mildly dilated. - Atrial septum: No defect or patent foramen ovale was identified. - Pulmonic valve: There was moderate regurgitation. - Pulmonary arteries: PA peak pressure: 44 mm Hg (S). - Pericardium, extracardiac: A trivial pericardial effusion was identified.   Treatments: surgery:    Minimally-Invasive Mitral Valve Replacement [facility] Mitral Bovine bioprosthetic tissue valve (size 32mm, model #[id number], serial [id number])   Minimally-Invasive Maze Procedure Complete bilateral atrial lesion set using cryothermy and bipolar radiofrequency ablation Clipping of Left Atrial Appendage (Atricure left atrial clip, size 45 mm)   Disposition: SNF   Discharge Medications:   Medication List    STOP taking these medications        apixaban 5 MG Tabs tablet  Commonly known as:  ELIQUIS      TAKE these medications        albuterol 108 (90 BASE) MCG/ACT inhaler  Commonly known as:  PROVENTIL HFA;VENTOLIN HFA  Inhale 2 puffs into the lungs every 6 (six) hours as needed for wheezing or shortness of breath.     ALPRAZolam 0.25 MG tablet  Commonly known as:  XANAX  Take 1 tablet (0.25 mg total) by mouth at bedtime as needed for anxiety or sleep.     amiodarone 200 MG tablet  Commonly known as:  PACERONE  Take 1 tablet (200 mg total) by mouth 2 (two) times daily.     azelastine 0.1 % nasal spray  Commonly known as:  ASTELIN  Place 1 spray into both nostrils daily.     butalbital-acetaminophen-caffeine 50-325-40 MG tablet  Commonly known as:  FIORICET, ESGIC  TAKE 1 TABLET BY MOUTH AS NEEDED FOR BACK PAIN OR HEADACHE     furosemide 40 MG tablet  Commonly known as:  LASIX  Take 1 tablet (40 mg total) by mouth daily. X 1 week     metoprolol tartrate 25 MG tablet   Commonly known as:  LOPRESSOR  Take 0.5 tablets (12.5 mg total) by mouth 2 (two) times daily.     montelukast 10 MG tablet  Commonly known as:  SINGULAIR  Take 10 mg by mouth at bedtime.     potassium chloride SA 20 MEQ tablet  Commonly known as:  K-DUR,KLOR-CON  Take 1 tablet (20 mEq total) by mouth daily. X 1 week     rosuvastatin 5 MG tablet  Commonly known as:  CRESTOR  Take 1 tablet (5 mg total) by mouth every other day.     traMADol 50 MG tablet  Commonly known as:  ULTRAM  Take 1-2 tablets (50-100 mg total) by mouth every 4 (four) hours as needed for moderate pain.     warfarin 1 MG tablet  Commonly known as:  COUMADIN  Please take 1 mg po daily or as directed by Coumadin Clinic        The patient has been discharged on:   1.Beta Blocker:  Yes [  x ]                              No   [   ]  If No, reason:  2.Ace Inhibitor/ARB: Yes [   ]                                     No  [  x  ]                                     If No, reason: labile BP  3.Statin:   Yes [ x  ]                  No  [   ]                  If No, reason:  4.Ecasa:  Yes  [ x  ]                  No   [   ]                  If No, reason:    Follow Up:   Follow-up Information    Follow up with [facility] On 11/22/2015.   Specialty:  Cardiology   Why:  11:00 AM pacemaker wound check   Contact information:   [address] [place] [phone]      Follow up with Cristopher Peru, MD On 02/08/2016.   Specialty:  Cardiology   Why:  9:45AM   Contact information:   [address] [address] [phone]       Follow up with Rexene Alberts, MD On 11/20/2015.   Specialty:  Cardiothoracic Surgery   Why:  Please have a chest x-ray at [facility] at 9:00, then see MD at 10:00   Contact information:   [address]  [address] [phone]       Follow up with Eileen Stanford, PA-C On 11/23/2015.   Specialties:  Cardiology, Radiology   Why:  Appointment is at [employer] information:   [place] [address] [phone]       Please follow up.   Why:  Please have bloodwork for Coumadin (PT/INR) drawn in the next 24 hours with results to [facility] for dosing instructions      Discharge Instructions: 1. Please obtain vital signs at least one time daily 2. Please weigh the patient daily. If he or she continues to gain weight or develops lower extremity edema, contact the office at [phone]. 3. Ambulate patient at least three times daily and please use sternal precautions.   Pacemaker instructions: No lifting left arm over head for 1 week. Keep the pacer site clean and dry and do not get wet for 1 week. You may shower starting on 11/14/2015.  Other incisions may be cleaned gently with soap and water in the meantime.   SignedBurke Keels 11/09/2015, 10:26 AM

## 2015-11-08 NOTE — Progress Notes (Signed)
Chest tubes d/c'd per order and per protocol. Dressing applied. Pt educated that drainage is normal. And to let RN know if bandage feels "wet". Pt tolerated procedure well. Call bell and phone within reach. Will continue to monitor.

## 2015-11-08 NOTE — NC FL2 (Signed)
[place] LEVEL OF CARE SCREENING TOOL     IDENTIFICATION  Patient Name: [name] [name] Birthdate: 29-Feb-1941 Sex: female Admission Date (Current Location): 10/31/2015  [facility] and [place] Number: Engineering geologist and Address:  [facility] [place] [address] [place], [place] [address]      Provider Number: [id number]  Attending Physician Name and Address:  Rexene Alberts, MD  Relative Name and Phone Number:       Current Level of Care: Hospital Recommended Level of Care: [place] Prior Approval Number:    Date Approved/Denied:   PASRR Number: [id number]  Discharge Plan: SNF    Current Diagnoses: Patient Active Problem List   Diagnosis Date Noted  . 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 S/P Minimally invasive maze operation for atrial fibrillation 10/31/2015  . Atrial fibrillation, persistent ([place])   . Acute on chronic diastolic (congestive) heart failure ([place])   . Severe mitral regurgitation 10/18/2015  . Mitral regurgitation 09/12/2015  . Restrictive lung disease 09/12/2015  . Pulmonary hypertension ([place]) 08/08/2015  . Mandibular fracture ([place]) 08/08/2015  . Nodule of right lung 08/04/2015  . Atrial fibrillation with RVR ([place]) 08/04/2015  . Lumbago 02/05/2015  . Thoracic aorta atherosclerosis ([place]) 02/05/2015  . Left breast mass 02/05/2015  . Family history of cerebral aneurysm 08/02/2014  . Diverticulosis of colon without hemorrhage 01/09/2014  . Thrombocytopenia, unspecified ([place]) 02/04/2013  . IBS (irritable bowel syndrome) 12/31/2012  . History of asbestos exposure 06/27/2012  . Osteoporosis of forearm 06/27/2012  . Anxiety associated with depression 06/25/2012  . Cataract extraction status of right eye 06/25/2012  . RBBB 05/05/2012  . Hyperlipidemia 08/06/2011  . Mitral valve prolapse 05/02/2011  . Palpitations 05/02/2011  .  Supraventricular tachycardia ([place]) 05/02/2011    Orientation ACTIVITIES/SOCIAL BLADDER RESPIRATION    Self, Time, Situation, Place    Continent    BEHAVIORAL SYMPTOMS/MOOD NEUROLOGICAL BOWEL NUTRITION STATUS      Continent Diet (clear liquid)  PHYSICIAN VISITS COMMUNICATION OF NEEDS Height & Weight Skin        149 lbs. Surgical wounds          AMBULATORY STATUS RESPIRATION    Supervision limited        Personal Care Assistance Level of Assistance  Bathing, Dressing Bathing Assistance: Limited assistance   Dressing Assistance: Limited assistance      Functional Limitations [place]  PT (By licensed PT)     PT Frequency: 5/wk             Additional Factors Info  Code Status, Allergies Code Status Info: FULL Allergies Info: oxycodone-acetaminophen, meloxicam, codeine, doxycycline, hydrocodone, hydromorphone, oxycodone           Current Medications (11/08/2015):  This is the current hospital active medication list Current Facility-Administered Medications  Medication Dose Route Frequency Provider Last Rate Last Dose  . 0.9 %  sodium chloride infusion  250 mL Intravenous Continuous Rexene Alberts, MD 1 mL/hr at 11/01/15 0654 250 mL at 11/01/15 0654  . 0.9 %  sodium chloride infusion  250 mL Intravenous PRN Rexene Alberts, MD 10 mL/hr at 11/07/15 0400 250 mL at 11/07/15 0400  . acetaminophen (TYLENOL) tablet 325-650 mg  325-650 mg Oral Q4H PRN Evans Lance, MD      . ALPRAZolam (  XANAX) tablet 0.25 mg  0.25 mg Oral QHS PRN Rexene Alberts, MD   0.25 mg at 11/06/15 2228  . amiodarone (PACERONE) tablet 200 mg  200 mg Oral BID Rexene Alberts, MD   200 mg at 11/07/15 2149  . bisacodyl (DULCOLAX) EC tablet 10 mg  10 mg Oral Daily Rexene Alberts, MD   10 mg at 11/07/15 [id number]   Or  . bisacodyl (DULCOLAX) suppository 10 mg  10 mg Rectal Daily Rexene Alberts, MD      . butalbital-acetaminophen-caffeine (FIORICET, ESGIC)  [id number] MG per tablet 1 tablet  1 tablet Oral Q4H PRN Rexene Alberts, MD   1 tablet at 11/03/15 0418  . chlorhexidine (PERIDEX) 0.12 % solution 15 mL  15 mL Mouth/Throat BID Rexene Alberts, MD   15 mL at 11/07/15 2159  . docusate sodium (COLACE) capsule 200 mg  200 mg Oral Daily Rexene Alberts, MD   200 mg at 11/07/15 0934  . fentaNYL (SUBLIMAZE) injection 50-100 mcg  50-100 mcg Intravenous Q1H PRN Rexene Alberts, MD      . furosemide (LASIX) tablet 40 mg  40 mg Oral Daily Rexene Alberts, MD   40 mg at 11/07/15 0934  . metoprolol tartrate (LOPRESSOR) tablet 12.5 mg  12.5 mg Oral BID Rexene Alberts, MD      . montelukast (SINGULAIR) tablet 10 mg  10 mg Oral QHS Rexene Alberts, MD   10 mg at 11/07/15 2148  . ondansetron (ZOFRAN) injection 4 mg  4 mg Intravenous Q6H PRN Evans Lance, MD      . pantoprazole (PROTONIX) EC tablet 40 mg  40 mg Oral Daily Rexene Alberts, MD   40 mg at 11/07/15 0934  . potassium chloride SA (K-DUR,KLOR-CON) CR tablet 20 mEq  20 mEq Oral Daily Rexene Alberts, MD      . rosuvastatin (CRESTOR) tablet 5 mg  5 mg Oral QODAY Rexene Alberts, MD   5 mg at 11/07/15 0934  . sodium chloride 0.9 % injection 10-40 mL  10-40 mL Intracatheter Q12H Rexene Alberts, MD   10 mL at 11/06/15 2229  . sodium chloride 0.9 % injection 10-40 mL  10-40 mL Intracatheter PRN Rexene Alberts, MD   20 mL at 11/08/15 0346  . sodium chloride 0.9 % injection 3 mL  3 mL Intravenous Q12H Rexene Alberts, MD   3 mL at 11/06/15 2229  . sodium chloride 0.9 % injection 3 mL  3 mL Intravenous PRN Rexene Alberts, MD      . sodium chloride 0.9 % injection 3 mL  3 mL Intravenous Q12H Rexene Alberts, MD   3 mL at 11/06/15 2229  . sodium chloride 0.9 % injection 3 mL  3 mL Intravenous PRN Rexene Alberts, MD      . traMADol Veatrice Bourbon) tablet 50-100 mg  50-100 mg Oral Q4H PRN Rexene Alberts, MD   100 mg at 11/07/15 2149  . warfarin (COUMADIN) tablet 1 mg  1 mg Oral q1800 Rexene Alberts, MD   1 mg at  11/07/15 1725  . Warfarin - Physician Dosing Inpatient   Does not apply [id number] Rexene Alberts, MD         Discharge Medications: Please see discharge summary for a list of discharge medications.  Relevant Imaging Results:  Relevant Lab Results:  Recent Labs    Additional Information SS# [id number]  [place], [place]

## 2015-11-09 ENCOUNTER — Inpatient Hospital Stay (HOSPITAL_COMMUNITY): Payer: Medicare Other

## 2015-11-09 LAB — PROTIME-INR
INR: 1.8 — ABNORMAL HIGH (ref 0.00–1.49)
Prothrombin Time: 20.9 seconds — ABNORMAL HIGH (ref 11.6–15.2)

## 2015-11-09 MED ORDER — FUROSEMIDE 40 MG PO TABS: 40.0000 mg | ORAL_TABLET | Freq: Every day | ORAL | Status: DC

## 2015-11-09 MED ORDER — WARFARIN SODIUM 2 MG PO TABS: 2.0000 mg | ORAL_TABLET | Freq: Every day | ORAL | Status: DC

## 2015-11-09 MED ORDER — TRAMADOL HCL 50 MG PO TABS: 50.0000 mg | ORAL_TABLET | ORAL | Status: DC | PRN

## 2015-11-09 MED ORDER — METOPROLOL TARTRATE 25 MG PO TABS: 12.5000 mg | ORAL_TABLET | Freq: Two times a day (BID) | ORAL | Status: DC

## 2015-11-09 MED ORDER — WARFARIN SODIUM 1 MG PO TABS
ORAL_TABLET | ORAL | Status: DC
Start: 2015-11-09 — End: 2015-11-10

## 2015-11-09 MED ORDER — WARFARIN SODIUM 1 MG PO TABS: ORAL_TABLET | ORAL | Status: DC

## 2015-11-09 MED ORDER — POTASSIUM CHLORIDE CRYS ER 20 MEQ PO TBCR: 20.0000 meq | EXTENDED_RELEASE_TABLET | Freq: Every day | ORAL | Status: DC

## 2015-11-09 NOTE — Progress Notes (Signed)
CARDIAC REHAB PHASE I   PRE:  Rate/Rhythm:70 paced  BP:  Sitting: 95/53        SaO2: 95 RA  MODE:  Ambulation: 350 ft   POST:  Rate/Rhythm: 70 paced  BP:  Sitting: 96/58         SaO2: 100 RA  Pt ambulated 350 ft on RA, rolling walker, assist x1, steady gait, tolerated well. Pt conversed during entire walk, denies DOE, pain, dizziness, declined rest stop. Cardiac surgery discharge education completed. Reviewed IS, restrictions, activity progression, exercise, heart healthy diet, sodium restrictions, daily weights, and phase 2 cardiac rehab. Pt verbalized understanding. Pt agrees to phase 2 cardiac rehab referral, will send to [facility] per pt request, pt eager to begin program. Pt to bed after walk for PICC line removal, call bell within reach, awaiting discharge.   [id number] Lenna Sciara, RN, BSN 11/09/2015 12:26 PM

## 2015-11-09 NOTE — Care Management Note (Signed)
Case Management Note  Patient Details  Name: VADER HELLMER MRN: [id number] Date of Birth: 08-03-1941  Subjective/Objective:        Pt is s/p MINI MVR and Maze            Action/Plan:  Pt is from home, pt will discharge to SNF today 11/09/2015    Expected Discharge Date:                  Expected Discharge Plan:  Skilled Nursing Facility  In-House Referral:  Clinical Social Work  Discharge planning Services  CM Consult  Post Acute Care Choice:    Choice offered to:     DME Arranged:    DME Agency:     HH Arranged:    [place] Agency:     Status of Service:  Completed, signed off  Medicare Important Message Given:  Yes Date Medicare IM Given:    Medicare IM give by:    Date Additional Medicare IM Given:    Additional Medicare Important Message give by:     If discussed at [place] of Stay Meetings, dates discussed:    Additional Comments:  Maryclare Labrador, RN 11/09/2015, 10:53 AM

## 2015-11-09 NOTE — Progress Notes (Signed)
Patient will discharge to [facility] Anticipated discharge date:11/09/15 Family notified: pt son Transportation by son  CSW signing off.  Domenica Reamer, [place] Social Worker [phone]

## 2015-11-09 NOTE — Progress Notes (Signed)
Patient to D/C to [facility] with son. Patient education done. Personal belongings given to patient. Pain medication prescription given to patient. PICC line removed. Tele box removed. Patient taken out to D/C with son.   Domingo Dimes RN

## 2015-11-09 NOTE — Progress Notes (Addendum)
       [address]       [place] [address]             [phone]          3 Days Post-Op Procedure(s) (LRB): Pacemaker Implant (N/A)  Subjective: Feels much better now that the chest tube is out. Appetite improving. Walking in halls with assistance.   Objective: Vital signs in last 24 hours: Patient Vitals for the past 24 hrs:  BP Temp Temp src Pulse Resp SpO2 Weight  11/09/15 0552 (!) 101/55 mmHg 97.7 F (36.5 C) Oral 71 18 97 % 147 lb 0.8 oz (66.7 kg)  11/08/15 2102 (!) 98/50 mmHg 98.9 F (37.2 C) Oral 70 18 100 % -  11/08/15 1447 (!) 92/52 mmHg 97.7 F (36.5 C) Oral 70 17 97 % -   Current Weight  11/09/15 147 lb 0.8 oz (66.7 kg)  BASELINE WEIGHT:65 kg   Intake/Output from previous day: 12/07 0701 - 12/08 0700 In: 40 [I.V.:40] Out: -     PHYSICAL EXAM:  Heart: RRR, paced Lungs: Clear Wound: Clean and dry, pacer site dressed Extremities: Mild LE edema    Lab Results: CBC: Recent Labs  11/07/15 0503 11/08/15 0345  WBC 6.4 6.6  HGB 9.7* 9.2*  HCT 29.5* 26.9*  PLT 137* 131*   BMET:  Recent Labs  11/07/15 0503 11/08/15 0345  NA 134* 133*  K 3.8 3.8  CL 105 103  CO2 26 26  GLUCOSE 95 100*  BUN 6 9  CREATININE 0.61 0.70  CALCIUM 7.6* 7.5*    PT/INR:  Recent Labs  11/09/15 0430  LABPROT 20.9*  INR 1.80*    CXR: FINDINGS: Stable cardiomegaly. Status post cardiac valve repair. Left-sided pacemaker is unchanged in position. Right-sided PICC line is unchanged in position with distal tip in expected position of SVC. No pneumothorax is noted. Stable mild bilateral pleural effusions are noted with associated subsegmental atelectasis. Right midlung subsegmental atelectasis is noted as well. Mild levoscoliosis of thoracic spine is noted. Right-sided chest tube has been removed.  IMPRESSION: No pneumothorax is seen status post right-sided chest tube removal. Stable mild bilateral pleural effusions are noted with  associated subsegmental atelectasis. Stable right midlung subsegmental atelectasis.   Assessment/Plan: S/P Procedure(s) (LRB): Pacemaker Implant (N/A)  CV- pacing appropriately. BPs low normal but stable. Continue Amio, Lopressor, Coumadin. Acute on chronic diastolic CHF with expected post-op volume excess  Expected postop blood loss anemia- H/H stable.  Continue CRPI, PT.  Disp- to SNF soon, hopefully in the next day or so if she remains stable.   LOS: 9 days    COLLINS,GINA H 11/09/2015  I have seen and examined the patient and agree with the assessment and plan as outlined.  Looks good.  D/C to [facility] today.  Increase coumadin back to 2 mg/day.  Rexene Alberts, MD 11/09/2015 10:56 AM

## 2015-11-10 ENCOUNTER — Telehealth: Payer: Self-pay | Admitting: Cardiovascular Disease

## 2015-11-10 ENCOUNTER — Ambulatory Visit (INDEPENDENT_AMBULATORY_CARE_PROVIDER_SITE_OTHER): Payer: Medicare Other | Admitting: *Deleted

## 2015-11-10 ENCOUNTER — Telehealth: Payer: Self-pay

## 2015-11-10 DIAGNOSIS — Z9889 Other specified postprocedural states: Secondary | ICD-10-CM | POA: Diagnosis not present

## 2015-11-10 DIAGNOSIS — Z953 Presence of xenogenic heart valve: Secondary | ICD-10-CM

## 2015-11-10 DIAGNOSIS — Z8679 Personal history of other diseases of the circulatory system: Secondary | ICD-10-CM | POA: Diagnosis not present

## 2015-11-10 LAB — POCT INR: INR: 1.3

## 2015-11-10 MED ORDER — WARFARIN SODIUM 2 MG PO TABS: 2.0000 mg | ORAL_TABLET | ORAL | Status: DC

## 2015-11-10 NOTE — Telephone Encounter (Signed)
Patient call and LM with answering service this am about needing RX's called to pharm. Ms Kate was MIA from nursing/rehab facility yesterday. And has no coumadin. She does have an appt today at 1430 for INR/PT check.    I called in the all  RX's from hospital discharge that she did not have at home and moved up her appt to see DR Roxy Manns to 11/13/15 at 11am.  After reviewing med's with her and  new appt time she stated that  She agreed and understood.

## 2015-11-10 NOTE — Telephone Encounter (Signed)
I spoke with Tula Nakayama, RN in our Coumadin Clinic who reviewed the patient's chart and advised me to send her the message for her to follow-up with the patient.

## 2015-11-10 NOTE — Telephone Encounter (Signed)
PT was seen in clinic today.  New Rx sent.  See anti-coag note for details.

## 2015-11-10 NOTE — Telephone Encounter (Signed)
New Message     Pt calling stating that she was recently in the hospital and was started on Coumadin and she needs a new prescription for it. Please call back and advise.

## 2015-11-10 NOTE — Patient Instructions (Signed)

## 2015-11-13 ENCOUNTER — Ambulatory Visit
Admission: RE | Admit: 2015-11-13 | Discharge: 2015-11-13 | Disposition: A | Discharge status: Home / Self Care | Payer: Medicare Other | Source: Ambulatory Visit | Attending: Thoracic Surgery (Cardiothoracic Vascular Surgery) | Admitting: Thoracic Surgery (Cardiothoracic Vascular Surgery)

## 2015-11-13 ENCOUNTER — Encounter: Payer: Self-pay | Admitting: Thoracic Surgery (Cardiothoracic Vascular Surgery)

## 2015-11-13 ENCOUNTER — Ambulatory Visit (INDEPENDENT_AMBULATORY_CARE_PROVIDER_SITE_OTHER): Payer: Self-pay | Admitting: Thoracic Surgery (Cardiothoracic Vascular Surgery)

## 2015-11-13 ENCOUNTER — Other Ambulatory Visit: Payer: Self-pay | Admitting: Thoracic Surgery (Cardiothoracic Vascular Surgery)

## 2015-11-13 VITALS — BP 88/58 | HR 72 | Resp 20 | Ht 66.0 in | Wt 147.0 lb

## 2015-11-13 DIAGNOSIS — I340 Nonrheumatic mitral (valve) insufficiency: Secondary | ICD-10-CM

## 2015-11-13 DIAGNOSIS — Z952 Presence of prosthetic heart valve: Secondary | ICD-10-CM

## 2015-11-13 DIAGNOSIS — I341 Nonrheumatic mitral (valve) prolapse: Secondary | ICD-10-CM

## 2015-11-13 DIAGNOSIS — Z954 Presence of other heart-valve replacement: Secondary | ICD-10-CM

## 2015-11-13 DIAGNOSIS — Z953 Presence of xenogenic heart valve: Secondary | ICD-10-CM

## 2015-11-13 DIAGNOSIS — Z8679 Personal history of other diseases of the circulatory system: Secondary | ICD-10-CM

## 2015-11-13 DIAGNOSIS — Z9889 Other specified postprocedural states: Secondary | ICD-10-CM

## 2015-11-13 DIAGNOSIS — I481 Persistent atrial fibrillation: Secondary | ICD-10-CM

## 2015-11-13 DIAGNOSIS — I4819 Other persistent atrial fibrillation: Secondary | ICD-10-CM

## 2015-11-13 MED ORDER — AMIODARONE HCL 200 MG PO TABS: 200.0000 mg | ORAL_TABLET | Freq: Every day | ORAL | Status: DC

## 2015-11-13 MED ORDER — METOPROLOL TARTRATE 25 MG PO TABS: 12.5000 mg | ORAL_TABLET | Freq: Two times a day (BID) | ORAL | Status: DC

## 2015-11-13 NOTE — Patient Instructions (Signed)
Continue all previous medications without any changes at this time  You may continue to gradually increase your physical activity as tolerated.  Refrain from any heavy lifting or strenuous use of your arms and shoulders until at least 8 weeks from the time of your surgery, and avoid activities that cause increased pain in your chest on the side of your surgical incision.  Otherwise you may continue to increase activities without any particular limitations.  Increase the intensity and duration of physical activity gradually.  

## 2015-11-13 NOTE — Progress Notes (Signed)
[address]       [place] [address]             [phone]     CARDIOTHORACIC SURGERY OFFICE NOTE  Referring Provider is Nahser, Wonda Cheng, MD PCP is [name], MD   HPI:  Patient returns to the office for follow-up status post minimally invasive valve replacement using a bioprosthetic tissue valve with Maze procedure on 10/31/2015 for severe symptomatic primary mitral regurgitation with recurrent persistent atrial fibrillation.  Her postoperative recovery was notable for the presence of severe bradycardia which persisted, ultimately requiring permanent pacemaker placement by Dr. Lovena Le on 11/06/2015.  She was subsequently discharged from the hospital on 11/09/2015. Because she lives alone, she was initially discharged to a local skilled nursing facility. However, she only stayed there one night after which time she checked her self out. She has been home over the weekend with assistance from family and friends to make sure that she is getting along uneventfully.  She had her prothrombin time checked on 11/10/2015 and is scheduled to return to the Coumadin clinic for follow-up on 11/15/2015.  She states that she has continued to improve since hospital discharge. She states that her weight remains 3 or 4 pounds above her preoperative baseline, but it is trending down. Her appetite is improving. She has mild soreness in the right side of her chest. She otherwise feels quite well. She denies any shortness of breath. She is sleeping fairly well. Overall she is delighted with her progress so far.   Current Outpatient Prescriptions  Medication Sig Dispense Refill  . albuterol (PROVENTIL HFA;VENTOLIN HFA) 108 (90 BASE) MCG/ACT inhaler Inhale 2 puffs into the lungs every 6 (six) hours as needed for wheezing or shortness of breath. 1 Inhaler 2  . ALPRAZolam (XANAX) 0.25 MG tablet Take 1 tablet (0.25 mg total) by mouth at bedtime as needed for anxiety or sleep. 30 tablet 5    . amiodarone (PACERONE) 200 MG tablet Take 1 tablet (200 mg total) by mouth daily. 30 tablet 0  . azelastine (ASTELIN) 0.1 % nasal spray Place 1 spray into both nostrils daily.  5  . butalbital-acetaminophen-caffeine (FIORICET, ESGIC) 50-325-40 MG per tablet TAKE 1 TABLET BY MOUTH AS NEEDED FOR BACK PAIN OR HEADACHE 90 tablet 0  . furosemide (LASIX) 40 MG tablet Take 1 tablet (40 mg total) by mouth daily. X 1 week 7 tablet 0  . metoprolol tartrate (LOPRESSOR) 25 MG tablet Take 0.5 tablets (12.5 mg total) by mouth 2 (two) times daily. 30 tablet 1  . montelukast (SINGULAIR) 10 MG tablet Take 10 mg by mouth at bedtime.      . potassium chloride SA (K-DUR,KLOR-CON) 20 MEQ tablet Take 1 tablet (20 mEq total) by mouth daily. X 1 week    . rosuvastatin (CRESTOR) 5 MG tablet Take 1 tablet (5 mg total) by mouth every other day. 45 tablet 3  . traMADol (ULTRAM) 50 MG tablet Take 1-2 tablets (50-100 mg total) by mouth every 4 (four) hours as needed for moderate pain. 30 tablet 0  . warfarin (COUMADIN) 2 MG tablet Take 1 tablet (2 mg total) by mouth as directed. 30 tablet 3   No current facility-administered medications for this visit.      Physical Exam:   BP 88/58 mmHg  Pulse 72  Resp 20  Ht 5\' 6"  (1.676 m)  Wt 66.679 kg (147 lb)  BMI 23.74 kg/m2  SpO2 91%  LMP  (  Exact Date)  General:  Well-appearing  Chest:   Clear to auscultation with slightly diminished breath sounds right lung base  CV:   Regular rate and rhythm without murmur  Incisions:  Clean and dry and healing nicely, chest tube sutures are removed  Abdomen:  Soft and nontender  Extremities:  Warm and well-perfused  Diagnostic Tests:  CHEST 2 VIEW  COMPARISON: 11/09/2015  FINDINGS: Mitral valve replacement unchanged. Left atrial appendage clip unchanged. Dual lead pacemaker unchanged.  Small left pleural effusion has improved in the interval. Small right effusion unchanged. Bibasilar atelectasis unchanged. No  edema.  Right arm PICC has been removed.  IMPRESSION: Improvement in left pleural effusion. No change in small right pleural effusion and right lower lobe airspace disease. Negative for pulmonary edema.   Electronically Signed  By: Franchot Gallo M.D.  On: 11/13/2015 10:34   Impression:  Overall patient is doing quite well approximately 2 weeks status post minimally invasive mitral valve replacement using a bioprosthetic tissue valve and Maze procedure. She is maintaining stable AV paced rhythm. Her last prothrombin time INR was subtherapeutic but her Coumadin dose is being adjusted and monitor through the Coumadin clinic.  Plan:  I have instructed the patient to decrease her dose of amiodarone to 200 mg daily. She will otherwise continue all current medications without change. I've encouraged patient to continue to gradually increase her physical activity as tolerated with her primary limitation remaining that she refrain from any heavy lifting or strenuous use of his arms or shoulders for at least another 4-6 weeks. If she otherwise does well and the soreness in her chest continues to improve think she could return to driving an automobile in 2-3 weeks.  She will return to see [place] here in the office in 4 weeks or sooner should further problems or questions arise.  All of her questions have been addressed.   Valentina Gu. Roxy Manns, MD 11/13/2015 11:43 AM

## 2015-11-15 ENCOUNTER — Ambulatory Visit (INDEPENDENT_AMBULATORY_CARE_PROVIDER_SITE_OTHER): Payer: Medicare Other

## 2015-11-15 DIAGNOSIS — E785 Hyperlipidemia, unspecified: Secondary | ICD-10-CM

## 2015-11-15 DIAGNOSIS — Z9889 Other specified postprocedural states: Secondary | ICD-10-CM

## 2015-11-15 DIAGNOSIS — Z953 Presence of xenogenic heart valve: Secondary | ICD-10-CM | POA: Diagnosis not present

## 2015-11-15 DIAGNOSIS — Z8679 Personal history of other diseases of the circulatory system: Secondary | ICD-10-CM | POA: Diagnosis not present

## 2015-11-15 LAB — POCT INR: INR: 1.6

## 2015-11-20 ENCOUNTER — Ambulatory Visit: Payer: Medicare Other | Admitting: Cardiovascular Disease

## 2015-11-20 ENCOUNTER — Ambulatory Visit: Payer: Medicare Other | Admitting: Thoracic Surgery (Cardiothoracic Vascular Surgery)

## 2015-11-20 MED FILL — Sodium Bicarbonate IV Soln 8.4%: INTRAVENOUS | Qty: 50 | Status: AC

## 2015-11-20 MED FILL — Heparin Sodium (Porcine) Inj 1000 Unit/ML: INTRAMUSCULAR | Qty: 20 | Status: AC

## 2015-11-20 MED FILL — Electrolyte-R (PH 7.4) Solution: INTRAVENOUS | Qty: 4000 | Status: AC

## 2015-11-20 MED FILL — Lidocaine HCl IV Inj 20 MG/ML: INTRAVENOUS | Qty: 5 | Status: AC

## 2015-11-20 MED FILL — Mannitol IV Soln 20%: INTRAVENOUS | Qty: 500 | Status: AC

## 2015-11-20 MED FILL — Sodium Chloride IV Soln 0.9%: INTRAVENOUS | Qty: 2000 | Status: AC

## 2015-11-22 ENCOUNTER — Encounter: Payer: Self-pay | Admitting: Internal Medicine

## 2015-11-22 ENCOUNTER — Ambulatory Visit (INDEPENDENT_AMBULATORY_CARE_PROVIDER_SITE_OTHER): Payer: Medicare Other | Admitting: *Deleted

## 2015-11-22 DIAGNOSIS — I4819 Other persistent atrial fibrillation: Secondary | ICD-10-CM

## 2015-11-22 DIAGNOSIS — I481 Persistent atrial fibrillation: Secondary | ICD-10-CM | POA: Diagnosis not present

## 2015-11-22 LAB — CUP PACEART INCLINIC DEVICE CHECK
Battery Impedance: 100 Ohm
Battery Remaining Longevity: 125 mo
Battery Voltage: 2.8 V
Brady Statistic AP VP Percent: 26 %
Brady Statistic AP VS Percent: 0 %
Brady Statistic AS VP Percent: 61 %
Brady Statistic AS VS Percent: 13 %
Date Time Interrogation Session: 20161221160907
Implantable Lead Implant Date: 20161205
Implantable Lead Implant Date: 20161205
Implantable Lead Location: 753859
Implantable Lead Location: 753860
Implantable Lead Model: 5076
Implantable Lead Model: 5076
Lead Channel Impedance Value: 548 Ohm
Lead Channel Impedance Value: 829 Ohm
Lead Channel Pacing Threshold Amplitude: 0.5 V
Lead Channel Pacing Threshold Pulse Width: 0.4 ms
Lead Channel Sensing Intrinsic Amplitude: 0.5 mV
Lead Channel Sensing Intrinsic Amplitude: 5.6 mV
Lead Channel Setting Pacing Amplitude: 3.5 V
Lead Channel Setting Pacing Amplitude: 3.5 V
Lead Channel Setting Pacing Pulse Width: 0.4 ms
Lead Channel Setting Sensing Sensitivity: 2.8 mV

## 2015-11-22 NOTE — Progress Notes (Signed)
Cardiology Office Note   Date:  11/23/2015   ID:  [name] [name], [name] [name] Aug 23, 1941, MRN [id number]  PCP:  [name], MD  Cardiologist: Dr. Nahser/ Dr. Gaylyn Rong hospital follow up- MVR/MAZE    History of Present Illness: [name] [name] is a 74 y.o. 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  She has had mitral valve prolapse and a heart murmur for most of her life. She developed recurrent paroxysmal SVT for which she underwent ablation in 2003 and again in 2007, in [place]. In 2008 she moved to [place] to be close to family and reestablish care locally with Dr. Acie Fredrickson who has been following her intermittently ever since. She remained stable and asymptomatic from a cardiac standpoint until this past summer when the patient began to develop symptoms of exertional shortness of breath. In August she developed an acute episode of shortness of breath, weakness, and chest tightness while she was traveling in [place]. She was hospitalized for several days in [place] where she was diagnosed with atrial fibrillation, mitral regurgitation, and congestive heart failure. She was initially anticoagulated using Lovenox and treated with diltiazem and Lasix. She returned to [facility] and has been followed carefully ever since by Dr. Acie Fredrickson. She was started on coumadin and underwent TEE guided cardioversion on 08/22/2015.TEE revealed bileaflet prolapse with calcified leaflets and moderate-severe mitral regurgitation. Follow-up transthoracic echocardiogram performed 10/10/2015 Confirmed the presence of severely calcified mitral  valve leaflets with restricted leaflet mobility and severe mitral regurgitation. Left ventricular size and systolic function remains normal. There was severe left atrial enlargement. The patient underwent left and right heart catheterization by Dr. Burt Knack on 10/18/2015. Catheterization was notable for the absence of significant coronary artery disease and only mild elevation of pulmonary artery pressures but hemodynamic and angiographic findings consistent with severe mitral regurgitation. The patient was referred for elective surgical consultation by Dr. Roxy Manns.  [name] [name] presented to [facility] on 10/31/2015. She was taken to the operating room and underwent Minimally Invasive Mitral Valve Replacement utilizing a 33 mm Edwards Magna Mitral Bovine prosthetic valve and a Complete MAZE procedure. She tolerated the procedure well and was taken to the SICU in stable condition. During her stay in the SICU, the patient was extubated. She was weaned off Neo-synephrine and Dopamine as tolerated. She had bradycardia and required back up pacing. Her rhythm did not improve and EP consult was obtained. She had acute exacerbation of chronic diastolic CHF that was difficult to treat initially due to bradycardia and relatively low BP. She was evaluated by Dr. Lovena Le who felt that if she did not improve she would require placement of permanent pacemaker. This was done on 11/06/2015 and the patient tolerated without difficulty. Her pacemaker has been interrogated and is functioning appropriately. Her temporary pacing wires were removed without difficulty. She was diuresed successfully. She has been started on Coumadin for prophylaxis post MAZE procedure.Because she lives alone, she was initially discharged to a local skilled nursing facility. However, she only stayed there one night after which time she checked her self out. She has been home with assistance from family and friends to make sure that she is  getting along  uneventfully.  She was seen by Dr. Roxy Manns for post op follow up on 11/13/15 and doing well from a surgical standpoint. She was maintaining NSR. He decreased her amiodarone to 200mg  po qd.    Today she presents for follow up. She has some chest pain from the chest tubes behind her right breast. She has no SOB but sometimes she feels like she has to cough. No LE edema, orthopnea, or PND. She was seen yesterday for a pacemaker wound check and it was reprogrammed. It showed she was in afib 100% of the time. Since that time she has felt uneasy like she has had too much caffeine to drink. The device was re-interrogated in the office today which showed: she had converted to NSR, but questionable atrial capture.     Past Medical History  Diagnosis Date  . SVT (supraventricular tachycardia) ([place])   . MVP (mitral valve prolapse)   . Hyperlipidemia   . Idiopathic thrombocytopenic purpura (ITP)   . Hemorrhoid   . IBS (irritable bowel syndrome)   . Migraine   . Severe mitral regurgitation   . RBBB   . Anxiety associated with depression     Prn alprazolam   . Thoracic aorta atherosclerosis ([place])   . Nodule of right lung   . Restrictive lung disease     Mild on PFT & likely cardiac in etiology   . 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 Atrial fibrillation, persistent ([place])     DCCV 08/22/2015  . Acute on chronic diastolic (congestive) heart failure ([place])   . 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 S/P Minimally invasive maze operation for atrial fibrillation 10/31/2015    Complete bilateral atrial lesion set using cryothermy and bipolar radiofrequency ablation with clipping of LA appendage via right mini thoracotomy approach  . Bradycardia post-op bradycardia, pacer dependent    MDT PPM 11/06/15, Dr. Lovena Le    Past Surgical History    Procedure Laterality Date  . Tubal ligation    . Foot surgery    . Varicose vein surgery Right   . Mandible fracture surgery  03-26-13  . Colonoscopy  2003  . Eye surgery Right 2013  . Breast biopsy    . Cardioversion N/A 08/22/2015    Procedure: CARDIOVERSION;  Surgeon: Thayer Headings, MD;  Location: [facility] ENDOSCOPY;  Service: Cardiovascular;  Laterality: N/A;  . Tee without cardioversion N/A 08/22/2015    Procedure: TRANSESOPHAGEAL ECHOCARDIOGRAM (TEE);  Surgeon: Thayer Headings, MD;  Location: [facility] ENDOSCOPY;  Service: Cardiovascular;  Laterality: N/A;  [place] Dates with cardioversion    . Cardiac catheterization N/A 10/18/2015    Procedure: Right/Left Heart Cath and Coronary Angiography;  Surgeon: Sherren Mocha, MD;  Location: [facility] CV LAB;  Service: Cardiovascular;  Laterality: N/A;  . Minimally invasive maze procedure N/A 10/31/2015    Procedure: MINIMALLY INVASIVE MAZE PROCEDURE;  Surgeon: Rexene Alberts, MD;  Location: [place];  Service: Open Heart Surgery;  Laterality: N/A;  . Tee without cardioversion N/A 10/31/2015    Procedure: TRANSESOPHAGEAL ECHOCARDIOGRAM (TEE);  Surgeon: Rexene Alberts, MD;  Location: [place];  Service: Open Heart Surgery;  Laterality: N/A;  . Mitral valve replacement Right 10/31/2015    Procedure: MINIMALLY INVASIVE MITRAL VALVE (MV) REPLACEMENT;  Surgeon: Rexene Alberts, MD;  Location: [place];  Service: Open Heart Surgery;  Laterality: Right;  .  Ep implantable device N/A 11/06/2015    Procedure: Pacemaker Implant;  Surgeon: Evans Lance, MD;  Location: [place] CV LAB;  Service: Cardiovascular;  Laterality: N/A;     Current Outpatient Prescriptions  Medication Sig Dispense Refill  . albuterol (PROVENTIL HFA;VENTOLIN HFA) 108 (90 BASE) MCG/ACT inhaler Inhale 2 puffs into the lungs every 6 (six) hours as needed for wheezing or shortness of breath. 1 Inhaler 2  . ALPRAZolam (XANAX) 0.25 MG tablet Take 1 tablet (0.25 mg total) by mouth at bedtime as needed for  anxiety or sleep. 30 tablet 5  . amiodarone (PACERONE) 200 MG tablet Take 1 tablet (200 mg total) by mouth daily. 30 tablet 0  . azelastine (ASTELIN) 0.1 % nasal spray Place 1 spray into both nostrils daily.  5  . butalbital-acetaminophen-caffeine (FIORICET, ESGIC) 50-325-40 MG per tablet TAKE 1 TABLET BY MOUTH AS NEEDED FOR BACK PAIN OR HEADACHE 90 tablet 0  . furosemide (LASIX) 40 MG tablet Take 40 mg by mouth daily as needed for fluid.    . metoprolol tartrate (LOPRESSOR) 25 MG tablet Take 0.5 tablets (12.5 mg total) by mouth 2 (two) times daily. 30 tablet 1  . montelukast (SINGULAIR) 10 MG tablet Take 10 mg by mouth daily.     . rosuvastatin (CRESTOR) 5 MG tablet Take 1 tablet (5 mg total) by mouth every other day. 45 tablet 3  . traMADol (ULTRAM) 50 MG tablet Take 1-2 tablets (50-100 mg total) by mouth every 4 (four) hours as needed for moderate pain. 30 tablet 0  . warfarin (COUMADIN) 2 MG tablet Take 1 tablet (2 mg total) by mouth as directed. 30 tablet 3   No current facility-administered medications for this visit.    Allergies:   Oxycodone-acetaminophen; Meloxicam; Codeine; Doxycycline; Hydrocodone; Hydromorphone; and Oxycodone    Social History:  The patient  reports that she has never smoked. She has never used smokeless tobacco. She reports that she does not drink alcohol or use illicit drugs.   Family History:  The patient's family history includes Arrhythmia in her brother and mother; Atrial fibrillation in her son; Heart failure in her mother; Hypertension in her mother; Liver cancer in her maternal grandmother; Stroke in her father; Stroke (age of onset: 31) in her brother; Stroke (age of onset: 68) in her maternal aunt. There is no history of Heart attack.    ROS:  Please see the history of present illness.   Otherwise, review of systems are positive for none.   All other systems are reviewed and negative.    PHYSICAL EXAM: VS:  BP 114/78 mmHg  Pulse 80  Ht 5\' 6"  (1.676  m)  Wt 141 lb 12.8 oz (64.32 kg)  BMI 22.90 kg/m2  LMP  (Exact Date) , BMI Body mass index is 22.9 kg/(m^2). GEN: Well nourished, well developed, in no acute distress HEENT: normal Neck: no JVD, carotid bruits, or masses Cardiac: RRR; no murmurs, rubs, or gallops,no edema  Respiratory:  clear to auscultation bilaterally, normal work of breathing GI: soft, nontender, nondistended, + BS MS: no deformity or atrophy Skin: warm and dry, no rash Neuro:  Strength and sensation are intact Psych: euthymic mood, full affect   EKG:  EKG is ordered today. The ekg ordered today demonstrates HR 80. V paced.    Recent Labs: 11/05/2015: Magnesium 2.0 11/08/2015: ALT 24; BUN 9; Creatinine, Ser 0.70; Hemoglobin 9.2*; Platelets 131*; Potassium 3.8; Sodium 133*    Lipid Panel    Component Value Date/Time  CHOL 176 02/02/2015 1016   CHOL 162 12/05/2014 0848   TRIG 54.0 02/02/2015 1016   HDL 65.30 02/02/2015 1016   HDL 61 12/05/2014 0848   CHOLHDL 3 02/02/2015 1016   CHOLHDL 2.7 12/05/2014 0848   VLDL 10.8 02/02/2015 1016   LDLCALC 100* 02/02/2015 1016   LDLCALC 85 12/05/2014 0848   LDLDIRECT 132.5 03/25/2013 0900      Wt Readings from Last 3 Encounters:  11/23/15 141 lb 12.8 oz (64.32 kg)  11/13/15 147 lb (66.679 kg)  11/09/15 147 lb 0.8 oz (66.7 kg)      Other studies Reviewed: Additional studies/ records that were reviewed today include: 2d ECHO, LHC Review of the above records demonstrates: see HPI  ASSESSMENT AND PLAN: [name] [name] is a 74 y.o. 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  Severe mitral regurgitation and  persistent atrial fibrillation s/p minimally invasive biprosthetic MVR and a Complete MAZE procedure (10/31/15): seen by Dr. Roxy Manns recently and doing well. She is on amiodarone 200mg  po qd. Will continue this for now. She has a CHADSVASC of 3 ( AGE 35 and Fsex 1). Will continue her on coumadin. INR goal 2-3. INR 2.2 on 11/23/15. I have provided her antibiotics to take prior to dental procedures.   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 Interrogated in the device clinic yesterday and functioning well: RV sensitivity reprogrammed from 4.56mV to 2.63mV, GT aware. Device programmed at 3.5V/auto capture programmed on for extra safety margin until 3 month visit. It showed she was in afib 100% of the time. Since that time she has felt uneasy like she has had too much caffeine to drink. The device was re-interrogated in the office today which showed that she had converted to NSR, but had questionable atrial capture. We will send her to get a CXR today to evaluate atrial lead. Dr. Lovena Le will be in the office tomorrow.   Chronic diastolic CHF: appears euvolemic on exam. She has been on lasix 40mg  po qd/Kdur 20 mEq qd. She may not need this long term now that her valve is replaced and she is maintaining NSR.    Current medicines are reviewed at length with the patient today.  The patient does not have concerns regarding medicines.  The following changes have been made:  no change  Labs/ tests ordered today include:  No orders of the defined types were placed in this encounter.    Disposition:   FU with Dr Acie Fredrickson in 2-3 months. She will follow up with Dr. Lovena Le depending on findings of chest x ray.   Renea Ee  11/23/2015 2:26 PM    [facility] [place], [place], [place]  [address] Phone: [phone]; Fax: [phone]

## 2015-11-22 NOTE — Progress Notes (Signed)
Wound check appointment. Steri-strips removed. Wound without redness or edema. Incision edges approximated, wound well healed with exception of small pinhole in medial incision from clipping residual stitch, patient aware to monitor for healing progress. Aware to call with signs/symptoms of infection. Normal device function. Thresholds, sensing, and impedances consistent with implant measurements. RV sensitivity reprogrammed from 4.72mV to 2.67mV, GT aware. Device programmed at 3.5V/auto capture programmed on for extra safety margin until 3 month visit. Histogram distribution appropriate for patient and level of activity. 1 mode switch (100%), 1 AHR, EGMs show AF, +warfarin, peak A >400bpm, peak V 105bpm. No high ventricular rates noted. Patient educated about wound care, arm mobility, lifting restrictions. ROV with GT on 02/08/16 at 9:45am.

## 2015-11-23 ENCOUNTER — Other Ambulatory Visit: Payer: Self-pay | Admitting: Physician Assistant

## 2015-11-23 ENCOUNTER — Ambulatory Visit (INDEPENDENT_AMBULATORY_CARE_PROVIDER_SITE_OTHER): Payer: Medicare Other | Admitting: *Deleted

## 2015-11-23 ENCOUNTER — Encounter: Payer: Self-pay | Admitting: Internal Medicine

## 2015-11-23 ENCOUNTER — Ambulatory Visit (INDEPENDENT_AMBULATORY_CARE_PROVIDER_SITE_OTHER): Payer: Medicare Other | Admitting: Physician Assistant

## 2015-11-23 ENCOUNTER — Encounter: Payer: Self-pay | Admitting: Physician Assistant

## 2015-11-23 ENCOUNTER — Ambulatory Visit
Admission: RE | Admit: 2015-11-23 | Discharge: 2015-11-23 | Disposition: A | Discharge status: Home / Self Care | Payer: Medicare Other | Source: Ambulatory Visit | Attending: Physician Assistant | Admitting: Physician Assistant

## 2015-11-23 VITALS — BP 114/78 | HR 80 | Ht 66.0 in | Wt 141.8 lb

## 2015-11-23 DIAGNOSIS — Z953 Presence of xenogenic heart valve: Secondary | ICD-10-CM

## 2015-11-23 DIAGNOSIS — Z9889 Other specified postprocedural states: Secondary | ICD-10-CM

## 2015-11-23 DIAGNOSIS — Z950 Presence of cardiac pacemaker: Secondary | ICD-10-CM

## 2015-11-23 DIAGNOSIS — I4891 Unspecified atrial fibrillation: Secondary | ICD-10-CM

## 2015-11-23 DIAGNOSIS — Z8679 Personal history of other diseases of the circulatory system: Secondary | ICD-10-CM | POA: Diagnosis not present

## 2015-11-23 LAB — POCT INR: INR: 2.2

## 2015-11-23 MED ORDER — AMOXICILLIN 500 MG PO TABS: 2000.0000 mg | ORAL_TABLET | Freq: Once | ORAL | Status: DC

## 2015-11-23 NOTE — Patient Instructions (Addendum)
Medication Instructions:  Your physician recommends that you continue on your current medications as directed. Please refer to the Current Medication list given to you today.   Labwork: None ordered  Testing/Procedures: A chest x-ray takes a picture of the organs and structures inside the chest, including the heart, lungs, and blood vessels. This test can show several things, including, whether the heart is enlarges; whether fluid is building up in the lungs; and whether pacemaker / defibrillator leads are still in place. Report to [facility] [place]. ([place]  8:30-4:30 you do not need an appt.  Follow-Up: Your physician recommends that you keep your scheduled appt 01/12/16 with Dr. Acie Fredrickson.   Any Other Special Instructions Will Be Listed Below (If Applicable).     If you need a refill on your cardiac medications before your next appointment, please call your pharmacy.

## 2015-11-23 NOTE — Progress Notes (Signed)
Pacemaker check in clinic during appt with Nell Range, [place]. Pt c/o feeling "wired" or like she has had a lot of caffeine since wound check yesterday. RV threshold, RA and RV sensing and impedances consistent with previous measurements. RA capture intermittent/ indeterminate. WC viewed iEGMs from A threshold testing- CXR recommended for RA lead placement. Device programmed to maximize longevity. 49.1% mode switched +warfarin. No high ventricular rates noted. Device programmed at appropriate safety margins. V histogram distribution appropriate for patient activity level. Device programmed to optimize intrinsic conduction. Estimated longevity 5.5-8.5 years. Pt to have CXR today and will review case with GT 11/24/15 when he is in the office. Pt agreeable.

## 2015-11-24 ENCOUNTER — Telehealth: Payer: Self-pay | Admitting: *Deleted

## 2015-11-24 NOTE — Telephone Encounter (Signed)
Pt called to inform of CXR results. No change is atrial lead position from 11/13/15. Reviewed with GT- he recommends reevaluation of PPM function at his appt in March. Pt feeling well today- HR 87bpm. Advised to let us know if she still feels poorly when she is here for Coumadin Clinic appt 11/30/15 and we will recheck PPM. She is agreeable.

## 2015-11-29 ENCOUNTER — Ambulatory Visit: Payer: Medicare Other

## 2015-11-29 ENCOUNTER — Encounter: Payer: Self-pay | Admitting: *Deleted

## 2015-11-29 ENCOUNTER — Encounter: Payer: Medicare Other | Attending: Cardiovascular Disease | Admitting: *Deleted

## 2015-11-29 VITALS — Ht 65.0 in | Wt 140.4 lb

## 2015-11-29 DIAGNOSIS — I4891 Unspecified atrial fibrillation: Secondary | ICD-10-CM | POA: Diagnosis not present

## 2015-11-29 DIAGNOSIS — Z9889 Other specified postprocedural states: Secondary | ICD-10-CM | POA: Diagnosis not present

## 2015-11-29 DIAGNOSIS — Z8679 Personal history of other diseases of the circulatory system: Secondary | ICD-10-CM

## 2015-11-29 DIAGNOSIS — Z950 Presence of cardiac pacemaker: Secondary | ICD-10-CM | POA: Diagnosis not present

## 2015-11-29 NOTE — Progress Notes (Signed)
Cardiac Individual Treatment Plan  Patient Details  Name: [name] [name] MRN: [id number] Date of Birth: 1941/05/02 Referring Provider:  Thayer Headings, MD  Initial Encounter Date: Date: 11/29/15  Visit Diagnosis: S/P mitral valve repair  Patient's Home Medications on Admission:  Current outpatient prescriptions:  .  albuterol (PROVENTIL HFA;VENTOLIN HFA) 108 (90 BASE) MCG/ACT inhaler, Inhale 2 puffs into the lungs every 6 (six) hours as needed for wheezing or shortness of breath., Disp: 1 Inhaler, Rfl: 2 .  ALPRAZolam (XANAX) 0.25 MG tablet, Take 1 tablet (0.25 mg total) by mouth at bedtime as needed for anxiety or sleep., Disp: 30 tablet, Rfl: 5 .  amiodarone (PACERONE) 200 MG tablet, Take 1 tablet (200 mg total) by mouth daily., Disp: 30 tablet, Rfl: 0 .  amoxicillin (AMOXIL) 500 MG tablet, Take 4 tablets (2,000 mg total) by mouth once. Take 4 tablets 60 minutes prior to dental work, Disp: 30 tablet, Rfl: 1 .  azelastine (ASTELIN) 0.1 % nasal spray, Place 1 spray into both nostrils daily., Disp: , Rfl: 5 .  butalbital-acetaminophen-caffeine (FIORICET, ESGIC) 50-325-40 MG per tablet, TAKE 1 TABLET BY MOUTH AS NEEDED FOR BACK PAIN OR HEADACHE, Disp: 90 tablet, Rfl: 0 .  furosemide (LASIX) 40 MG tablet, Take 40 mg by mouth daily as needed for fluid., Disp: , Rfl:  .  metoprolol tartrate (LOPRESSOR) 25 MG tablet, Take 0.5 tablets (12.5 mg total) by mouth 2 (two) times daily., Disp: 30 tablet, Rfl: 1 .  montelukast (SINGULAIR) 10 MG tablet, Take 10 mg by mouth daily. , Disp: , Rfl:  .  rosuvastatin (CRESTOR) 5 MG tablet, Take 1 tablet (5 mg total) by mouth every other day., Disp: 45 tablet, Rfl: 3 .  traMADol (ULTRAM) 50 MG tablet, Take 1-2 tablets (50-100 mg total) by mouth every 4 (four) hours as needed for moderate pain., Disp: 30 tablet, Rfl: 0 .  warfarin (COUMADIN) 2 MG tablet, Take 1 tablet (2 mg total) by mouth as directed., Disp: 30 tablet, Rfl: 3  Past Medical History: Past  Medical History  Diagnosis Date  . SVT (supraventricular tachycardia) ([place])   . MVP (mitral valve prolapse)   . Hyperlipidemia   . Idiopathic thrombocytopenic purpura (ITP)   . Hemorrhoid   . IBS (irritable bowel syndrome)   . Migraine   . Severe mitral regurgitation   . RBBB   . Anxiety associated with depression     Prn alprazolam   . Thoracic aorta atherosclerosis ([place])   . Nodule of right lung   . Restrictive lung disease     Mild on PFT & likely cardiac in etiology   . 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 Atrial fibrillation, persistent ([place])     DCCV 08/22/2015  . Acute on chronic diastolic (congestive) heart failure ([place])   . 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 S/P Minimally invasive maze operation for atrial fibrillation 10/31/2015    Complete bilateral atrial lesion set using cryothermy and bipolar radiofrequency ablation with clipping of LA appendage via right mini thoracotomy approach  . Bradycardia post-op bradycardia, pacer dependent    MDT PPM 11/06/15, Dr. Lovena Le    Tobacco Use: History  Smoking status  . Never Smoker   Smokeless tobacco  . Never Used    Labs: Recent Review Flowsheet Data    Labs for ITP Cardiac and Pulmonary Rehab  Latest Ref Rng 11/01/2015 11/01/2015 11/01/2015 11/01/2015 11/01/2015   PHART 7.350 - 7.450 7.391 7.391 - 7.226(L) -   PCO2ART 35.0 - 45.0 mmHg 38.8 37.9 - 59.5(HH) -   HCO3 20.0 - 24.0 mEq/L 23.6 23.1 - 24.7(H) -   TCO2 0 - 100 mmol/L 25 24 25 26 22    ACIDBASEDEF 0.0 - 2.0 mmol/L 1.0 2.0 - 4.0(H) -   O2SAT - 97.0 95.0 - 100.0 -       Exercise Target Goals: Date: 11/29/15  Exercise Program Goal: Individual exercise prescription set with THRR, safety & activity barriers. Participant demonstrates ability to understand and report RPE using BORG scale, to self-measure pulse  accurately, and to acknowledge the importance of the exercise prescription.  Exercise Prescription Goal: Starting with aerobic activity 30 plus minutes a day, 3 days per week for initial exercise prescription. Provide home exercise prescription and guidelines that participant acknowledges understanding prior to discharge.  Activity Barriers & Risk Stratification:     Activity Barriers & Risk Stratification - 11/29/15 1849    Activity Barriers & Risk Stratification   Activity Barriers Incisional Pain;History of Falls;Arthritis;Back Problems;Other (comment)   Comments Incisional pain on right chest from MVR/s/p two chest tubes.  Patient is on an eight week lifting restriction of right arm through January 24th, 2017.        6 Minute Walk:     6 Minute Walk      11/29/15 1618       6 Minute Walk   Phase Initial     Distance 1275 feet     Walk Time 6 minutes     Resting HR 88 bpm     Resting BP 118/74 mmHg     Max Ex. HR 113 bpm     Max Ex. BP 126/62 mmHg     RPE 13     Symptoms No        Initial Exercise Prescription:     Initial Exercise Prescription - 11/29/15 1600    Date of Initial Exercise Prescription   Date 11/29/15   Treadmill   MPH 2   Grade 0   Minutes 10   Bike   Level 0.4   Minutes 10   Recumbant Bike   Level 3   RPM 40   Watts 25   Minutes 10   NuStep   Level 2   Watts 30   Minutes 15   Arm Ergometer   Level 1   Watts 8   Minutes 10   Arm/Foot Ergometer   Level 4   Watts 12   Minutes 10   Cybex   Level 1   RPM 50   Minutes 10   Recumbant Elliptical   Level 1   RPM 40   Watts 10   Minutes 10   Elliptical   Level 1   Speed 3   Minutes 1   REL-XR   Level 3   Watts 35   Minutes 15   T5 Nustep   Level 2   Watts 20   Minutes 15   Biostep-RELP   Level 3   Watts 20   Minutes 15   Prescription Details   Frequency (times per week) 3   Duration Progress to 30 minutes of continuous aerobic without signs/symptoms of physical  distress   Intensity   THRR REST +  30   Ratings of Perceived Exertion 11-15   Progression Continue progressive overload as per policy without signs/symptoms or  physical distress.   Resistance Training   Training Prescription Yes   Weight 2   Reps 10-15      Exercise Prescription Changes:   Discharge Exercise Prescription (Final Exercise Prescription Changes):   Nutrition:  Target Goals: Understanding of nutrition guidelines, daily intake of sodium 1500mg , cholesterol 200mg , calories 30% from fat and 7% or less from saturated fats, daily to have 5 or more servings of fruits and vegetables.  Biometrics:     Pre Biometrics - 11/29/15 1617    Pre Biometrics   Height 5\' 5"  (1.651 m)   Weight 140 lb 6.4 oz (63.685 kg)   Waist Circumference 32 inches   Hip Circumference 39.5 inches   Waist to Hip Ratio 0.81 %   BMI (Calculated) 23.4       Nutrition Therapy Plan and Nutrition Goals:   Nutrition Discharge: Rate Your Plate Scores:   Nutrition Goals Re-Evaluation:   Psychosocial: Target Goals: Acknowledge presence or absence of depression, maximize coping skills, provide positive support system. Participant is able to verbalize types and ability to use techniques and skills needed for reducing stress and depression.  Initial Review & Psychosocial Screening:     Initial Psych Review & Screening - 11/29/15 [place]? Yes   Comments Pt has good support system, as she has one adult son and adult daughter and 4 grand children.     Barriers   Psychosocial barriers to participate in program There are no identifiable barriers or psychosocial needs.   Screening Interventions   Interventions Encouraged to exercise;Program counselor consult      Quality of Life Scores:   PHQ-9:     Recent Review Flowsheet Data    Depression screen [facility] 2/9 11/29/2015 02/02/2015   Decreased Interest 0 0   Down, Depressed, Hopeless 0 0   PHQ - 2  Score 0 0   Altered sleeping 1 -   Tired, decreased energy 1 -   Change in appetite 0 -   Feeling bad or failure about yourself  0 -   Trouble concentrating 0 -   Moving slowly or fidgety/restless 0 -   Suicidal thoughts 0 -   PHQ-9 Score 2 -   Difficult doing work/chores Not difficult at all -      Psychosocial Evaluation and Intervention:   Psychosocial Re-Evaluation:   Vocational Rehabilitation: Provide vocational rehab assistance to qualifying candidates.   Vocational Rehab Evaluation & Intervention:     Vocational Rehab - 11/29/15 1819    Initial Vocational Rehab Evaluation & Intervention   Assessment shows need for Vocational Rehabilitation No      Education: Education Goals: Education classes will be provided on a weekly basis, covering required topics. Participant will state understanding/return demonstration of topics presented.  Learning Barriers/Preferences:     Learning Barriers/Preferences - 11/29/15 1816    Learning Barriers/Preferences   Learning Barriers None   Learning Preferences None      Education Topics: General Nutrition Guidelines/Fats and Fiber: -Group instruction provided by verbal, written material, models and posters to present the general guidelines for heart healthy nutrition. Gives an explanation and review of dietary fats and fiber.   Controlling Sodium/Reading Food Labels: -Group verbal and written material supporting the discussion of sodium use in heart healthy nutrition. Review and explanation with models, verbal and written materials for utilization of the food label.   Exercise Physiology & Risk Factors: - Group verbal and written instruction with models  to review the exercise physiology of the cardiovascular system and associated critical values. Details cardiovascular disease risk factors and the goals associated with each risk factor.   Aerobic Exercise & Resistance Training: - Gives group verbal and written discussion  on the health impact of inactivity. On the components of aerobic and resistive training programs and the benefits of this training and how to safely progress through these programs.   Flexibility, Balance, General Exercise Guidelines: - Provides group verbal and written instruction on the benefits of flexibility and balance training programs. Provides general exercise guidelines with specific guidelines to those with heart or lung disease. Demonstration and skill practice provided.   Stress Management: - Provides group verbal and written instruction about the health risks of elevated stress, cause of high stress, and healthy ways to reduce stress.   Depression: - Provides group verbal and written instruction on the correlation between heart/lung disease and depressed mood, treatment options, and the stigmas associated with seeking treatment.   Anatomy & Physiology of the Heart: - Group verbal and written instruction and models provide basic cardiac anatomy and physiology, with the coronary electrical and arterial systems. Review of: AMI, Angina, Valve disease, Heart Failure, Cardiac Arrhythmia, Pacemakers, and the ICD.   Cardiac Procedures: - Group verbal and written instruction and models to describe the testing methods done to diagnose heart disease. Reviews the outcomes of the test results. Describes the treatment choices: Medical Management, Angioplasty, or Coronary Bypass Surgery.   Cardiac Medications: - Group verbal and written instruction to review commonly prescribed medications for heart disease. Reviews the medication, class of the drug, and side effects. Includes the steps to properly store meds and maintain the prescription regimen.   Go Sex-Intimacy & Heart Disease, Get SMART - Goal Setting: - Group verbal and written instruction through game format to discuss heart disease and the return to sexual intimacy. Provides group verbal and written material to discuss and apply  goal setting through the application of the S.M.A.R.T. Method.   Other Matters of the Heart: - Provides group verbal, written materials and models to describe Heart Failure, Angina, Valve Disease, and Diabetes in the realm of heart disease. Includes description of the disease process and treatment options available to the cardiac patient.   Exercise & Equipment Safety: - Individual verbal instruction and demonstration of equipment use and safety with use of the equipment.          Cardiac Rehab from 11/29/2015 in [facility] Cardiac and Pulmonary Rehab   Date  11/29/15   Educator  DW   Instruction Review Code  1- partially meets, needs review/practice      Infection Prevention: - Provides verbal and written material to individual with discussion of infection control including proper hand washing and proper equipment cleaning during exercise session.      Cardiac Rehab from 11/29/2015 in [facility] Cardiac and Pulmonary Rehab   Date  11/29/15   Educator  DW   Instruction Review Code  2- meets goals/outcomes      Falls Prevention: - Provides verbal and written material to individual with discussion of falls prevention and safety.      Cardiac Rehab from 11/29/2015 in [facility] Cardiac and Pulmonary Rehab   Date  11/29/15   Educator  DW   Instruction Review Code  2- meets goals/outcomes      Diabetes: - Individual verbal and written instruction to review signs/symptoms of diabetes, desired ranges of glucose level fasting, after meals and with exercise. Advice that pre  and post exercise glucose checks will be done for 3 sessions at entry of program.    Knowledge Questionnaire Score:     Knowledge Questionnaire Score - 11/29/15 1826    Knowledge Questionnaire Score   Pre Score 27//28      Personal Goals and Risk Factors at Admission:     Personal Goals and Risk Factors at Admission - 11/29/15 1836    Personal Goals and Risk Factors on Admission   Personal Goal Patient would like to be  able to make her on bed and vacumn again.     Intervention Other  Start Cardiac Rehab and follow exercise prescription.  Patient's 8 week lifting restriction still in effect at this time.        Personal Goals and Risk Factors Review:    Personal Goals Discharge (Final Personal Goals and Risk Factors Review):    ITP Comments:   Comments: Patient will start Cardiac Rehab on December 06, 2015.  She will be attending the Mon-Wed-Thursday 3:45 p.m. Class.  Patient is currently on an 8 week lifting restriction of right arm due to MVR incision right chest and 2 chest tube sites.  Patient wants to exercise until it becomes a habit; be able to make her own bed again; and vacuum.

## 2015-11-29 NOTE — Progress Notes (Deleted)
Cardiac Individual Treatment Plan  Patient Details  Name: [name] [name] MRN: [id number] Date of Birth: 1941/08/23 Referring Provider:  Thayer Headings, MD  Initial Encounter Date: Date: 11/29/15  Visit Diagnosis: S/P mitral valve repair  Patient's Home Medications on Admission:  Current outpatient prescriptions:  .  albuterol (PROVENTIL HFA;VENTOLIN HFA) 108 (90 BASE) MCG/ACT inhaler, Inhale 2 puffs into the lungs every 6 (six) hours as needed for wheezing or shortness of breath., Disp: 1 Inhaler, Rfl: 2 .  ALPRAZolam (XANAX) 0.25 MG tablet, Take 1 tablet (0.25 mg total) by mouth at bedtime as needed for anxiety or sleep., Disp: 30 tablet, Rfl: 5 .  amiodarone (PACERONE) 200 MG tablet, Take 1 tablet (200 mg total) by mouth daily., Disp: 30 tablet, Rfl: 0 .  amoxicillin (AMOXIL) 500 MG tablet, Take 4 tablets (2,000 mg total) by mouth once. Take 4 tablets 60 minutes prior to dental work, Disp: 30 tablet, Rfl: 1 .  azelastine (ASTELIN) 0.1 % nasal spray, Place 1 spray into both nostrils daily., Disp: , Rfl: 5 .  butalbital-acetaminophen-caffeine (FIORICET, ESGIC) 50-325-40 MG per tablet, TAKE 1 TABLET BY MOUTH AS NEEDED FOR BACK PAIN OR HEADACHE, Disp: 90 tablet, Rfl: 0 .  furosemide (LASIX) 40 MG tablet, Take 40 mg by mouth daily as needed for fluid., Disp: , Rfl:  .  metoprolol tartrate (LOPRESSOR) 25 MG tablet, Take 0.5 tablets (12.5 mg total) by mouth 2 (two) times daily., Disp: 30 tablet, Rfl: 1 .  montelukast (SINGULAIR) 10 MG tablet, Take 10 mg by mouth daily. , Disp: , Rfl:  .  rosuvastatin (CRESTOR) 5 MG tablet, Take 1 tablet (5 mg total) by mouth every other day., Disp: 45 tablet, Rfl: 3 .  traMADol (ULTRAM) 50 MG tablet, Take 1-2 tablets (50-100 mg total) by mouth every 4 (four) hours as needed for moderate pain., Disp: 30 tablet, Rfl: 0 .  warfarin (COUMADIN) 2 MG tablet, Take 1 tablet (2 mg total) by mouth as directed., Disp: 30 tablet, Rfl: 3  Past Medical History: Past  Medical History  Diagnosis Date  . SVT (supraventricular tachycardia) ([place])   . MVP (mitral valve prolapse)   . Hyperlipidemia   . Idiopathic thrombocytopenic purpura (ITP)   . Hemorrhoid   . IBS (irritable bowel syndrome)   . Migraine   . Severe mitral regurgitation   . RBBB   . Anxiety associated with depression     Prn alprazolam   . Thoracic aorta atherosclerosis ([place])   . Nodule of right lung   . Restrictive lung disease     Mild on PFT & likely cardiac in etiology   . 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 Atrial fibrillation, persistent ([place])     DCCV 08/22/2015  . Acute on chronic diastolic (congestive) heart failure ([place])   . 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 S/P Minimally invasive maze operation for atrial fibrillation 10/31/2015    Complete bilateral atrial lesion set using cryothermy and bipolar radiofrequency ablation with clipping of LA appendage via right mini thoracotomy approach  . Bradycardia post-op bradycardia, pacer dependent    MDT PPM 11/06/15, Dr. Lovena Le    Tobacco Use: History  Smoking status  . Never Smoker   Smokeless tobacco  . Never Used    Labs: Recent Review Flowsheet Data    Labs for ITP Cardiac and Pulmonary Rehab  Latest Ref Rng 11/01/2015 11/01/2015 11/01/2015 11/01/2015 11/01/2015   PHART 7.350 - 7.450 7.391 7.391 - 7.226(L) -   PCO2ART 35.0 - 45.0 mmHg 38.8 37.9 - 59.5(HH) -   HCO3 20.0 - 24.0 mEq/L 23.6 23.1 - 24.7(H) -   TCO2 0 - 100 mmol/L 25 24 25 26 22    ACIDBASEDEF 0.0 - 2.0 mmol/L 1.0 2.0 - 4.0(H) -   O2SAT - 97.0 95.0 - 100.0 -       Exercise Target Goals: Date: 11/29/15  Exercise Program Goal: Individual exercise prescription set with THRR, safety & activity barriers. Participant demonstrates ability to understand and report RPE using BORG scale, to self-measure pulse  accurately, and to acknowledge the importance of the exercise prescription.  Exercise Prescription Goal: Starting with aerobic activity 30 plus minutes a day, 3 days per week for initial exercise prescription. Provide home exercise prescription and guidelines that participant acknowledges understanding prior to discharge.  Activity Barriers & Risk Stratification:     Activity Barriers & Risk Stratification - 11/29/15 1829    Activity Barriers & Risk Stratification   Activity Barriers Incisional Pain  Pt has two chest tubes on the right side of her chest when she had her minimally invasive mitral valve replacment.  Patient is on an eight week  lifting restriction with right arm  to promote healing of right chest incision and old chest tube sites.        6 Minute Walk:     6 Minute Walk      11/29/15 1618       6 Minute Walk   Phase Initial     Distance 1275 feet     Walk Time 6 minutes     Resting HR 88 bpm     Resting BP 118/74 mmHg     Max Ex. HR 113 bpm     Max Ex. BP 126/62 mmHg     RPE 13     Symptoms No        Initial Exercise Prescription:     Initial Exercise Prescription - 11/29/15 1600    Date of Initial Exercise Prescription   Date 11/29/15   Treadmill   MPH 2   Grade 0   Minutes 10   Bike   Level 0.4   Minutes 10   Recumbant Bike   Level 3   RPM 40   Watts 25   Minutes 10   NuStep   Level 2   Watts 30   Minutes 15   Arm Ergometer   Level 1   Watts 8   Minutes 10   Arm/Foot Ergometer   Level 4   Watts 12   Minutes 10   Cybex   Level 1   RPM 50   Minutes 10   Recumbant Elliptical   Level 1   RPM 40   Watts 10   Minutes 10   Elliptical   Level 1   Speed 3   Minutes 1   REL-XR   Level 3   Watts 35   Minutes 15   T5 Nustep   Level 2   Watts 20   Minutes 15   Biostep-RELP   Level 3   Watts 20   Minutes 15   Prescription Details   Frequency (times per week) 3   Duration Progress to 30 minutes of continuous aerobic without  signs/symptoms of physical distress   Intensity   THRR REST +  30   Ratings of  Perceived Exertion 11-15   Progression Continue progressive overload as per policy without signs/symptoms or physical distress.   Resistance Training   Training Prescription Yes   Weight 2   Reps 10-15      Exercise Prescription Changes:   Discharge Exercise Prescription (Final Exercise Prescription Changes):   Nutrition:  Target Goals: Understanding of nutrition guidelines, daily intake of sodium 1500mg , cholesterol 200mg , calories 30% from fat and 7% or less from saturated fats, daily to have 5 or more servings of fruits and vegetables.  Biometrics:     Pre Biometrics - 11/29/15 1617    Pre Biometrics   Height 5\' 5"  (1.651 m)   Weight 140 lb 6.4 oz (63.685 kg)   Waist Circumference 32 inches   Hip Circumference 39.5 inches   Waist to Hip Ratio 0.81 %   BMI (Calculated) 23.4       Nutrition Therapy Plan and Nutrition Goals:   Nutrition Discharge: Rate Your Plate Scores:   Nutrition Goals Re-Evaluation:   Psychosocial: Target Goals: Acknowledge presence or absence of depression, maximize coping skills, provide positive support system. Participant is able to verbalize types and ability to use techniques and skills needed for reducing stress and depression.  Initial Review & Psychosocial Screening:     Initial Psych Review & Screening - 11/29/15 [place]? Yes   Comments Pt has good support system, as she has one adult son and adult daughter and 4 grand children.     Barriers   Psychosocial barriers to participate in program There are no identifiable barriers or psychosocial needs.   Screening Interventions   Interventions Encouraged to exercise;Program counselor consult      Quality of Life Scores:   PHQ-9:     Recent Review Flowsheet Data    Depression screen [facility] 2/9 11/29/2015 02/02/2015   Decreased Interest 0 0   Down, Depressed,  Hopeless 0 0   PHQ - 2 Score 0 0   Altered sleeping 1 -   Tired, decreased energy 1 -   Change in appetite 0 -   Feeling bad or failure about yourself  0 -   Trouble concentrating 0 -   Moving slowly or fidgety/restless 0 -   Suicidal thoughts 0 -   PHQ-9 Score 2 -   Difficult doing work/chores Not difficult at all -      Psychosocial Evaluation and Intervention:   Psychosocial Re-Evaluation:   Vocational Rehabilitation: Provide vocational rehab assistance to qualifying candidates.   Vocational Rehab Evaluation & Intervention:     Vocational Rehab - 11/29/15 1819    Initial Vocational Rehab Evaluation & Intervention   Assessment shows need for Vocational Rehabilitation No      Education: Education Goals: Education classes will be provided on a weekly basis, covering required topics. Participant will state understanding/return demonstration of topics presented.  Learning Barriers/Preferences:     Learning Barriers/Preferences - 11/29/15 1816    Learning Barriers/Preferences   Learning Barriers None   Learning Preferences None      Education Topics: General Nutrition Guidelines/Fats and Fiber: -Group instruction provided by verbal, written material, models and posters to present the general guidelines for heart healthy nutrition. Gives an explanation and review of dietary fats and fiber.   Controlling Sodium/Reading Food Labels: -Group verbal and written material supporting the discussion of sodium use in heart healthy nutrition. Review and explanation with models, verbal and written materials for utilization of the food label.  Exercise Physiology & Risk Factors: - Group verbal and written instruction with models to review the exercise physiology of the cardiovascular system and associated critical values. Details cardiovascular disease risk factors and the goals associated with each risk factor.   Aerobic Exercise & Resistance Training: - Gives group verbal  and written discussion on the health impact of inactivity. On the components of aerobic and resistive training programs and the benefits of this training and how to safely progress through these programs.   Flexibility, Balance, General Exercise Guidelines: - Provides group verbal and written instruction on the benefits of flexibility and balance training programs. Provides general exercise guidelines with specific guidelines to those with heart or lung disease. Demonstration and skill practice provided.   Stress Management: - Provides group verbal and written instruction about the health risks of elevated stress, cause of high stress, and healthy ways to reduce stress.   Depression: - Provides group verbal and written instruction on the correlation between heart/lung disease and depressed mood, treatment options, and the stigmas associated with seeking treatment.   Anatomy & Physiology of the Heart: - Group verbal and written instruction and models provide basic cardiac anatomy and physiology, with the coronary electrical and arterial systems. Review of: AMI, Angina, Valve disease, Heart Failure, Cardiac Arrhythmia, Pacemakers, and the ICD.   Cardiac Procedures: - Group verbal and written instruction and models to describe the testing methods done to diagnose heart disease. Reviews the outcomes of the test results. Describes the treatment choices: Medical Management, Angioplasty, or Coronary Bypass Surgery.   Cardiac Medications: - Group verbal and written instruction to review commonly prescribed medications for heart disease. Reviews the medication, class of the drug, and side effects. Includes the steps to properly store meds and maintain the prescription regimen.   Go Sex-Intimacy & Heart Disease, Get SMART - Goal Setting: - Group verbal and written instruction through game format to discuss heart disease and the return to sexual intimacy. Provides group verbal and written material  to discuss and apply goal setting through the application of the S.M.A.R.T. Method.   Other Matters of the Heart: - Provides group verbal, written materials and models to describe Heart Failure, Angina, Valve Disease, and Diabetes in the realm of heart disease. Includes description of the disease process and treatment options available to the cardiac patient.   Exercise & Equipment Safety: - Individual verbal instruction and demonstration of equipment use and safety with use of the equipment.          Cardiac Rehab from 11/29/2015 in [facility] Cardiac and Pulmonary Rehab   Date  11/29/15   Educator  DW   Instruction Review Code  1- partially meets, needs review/practice      Infection Prevention: - Provides verbal and written material to individual with discussion of infection control including proper hand washing and proper equipment cleaning during exercise session.      Cardiac Rehab from 11/29/2015 in [facility] Cardiac and Pulmonary Rehab   Date  11/29/15   Educator  DW   Instruction Review Code  2- meets goals/outcomes      Falls Prevention: - Provides verbal and written material to individual with discussion of falls prevention and safety.      Cardiac Rehab from 11/29/2015 in [facility] Cardiac and Pulmonary Rehab   Date  11/29/15   Educator  DW   Instruction Review Code  2- meets goals/outcomes      Diabetes: - Individual verbal and written instruction to review signs/symptoms of diabetes, desired  ranges of glucose level fasting, after meals and with exercise. Advice that pre and post exercise glucose checks will be done for 3 sessions at entry of program.    Knowledge Questionnaire Score:     Knowledge Questionnaire Score - 11/29/15 1826    Knowledge Questionnaire Score   Pre Score 27//28      Personal Goals and Risk Factors at Admission:     Personal Goals and Risk Factors at Admission - 11/29/15 1836    Personal Goals and Risk Factors on Admission   Personal Goal  Patient would like to be able to make her on bed and vacumn again.     Intervention Other  Start Cardiac Rehab and follow exercise prescription.  Patient's 8 week lifting restriction still in effect at this time.        Personal Goals and Risk Factors Review:    Personal Goals Discharge (Final Personal Goals and Risk Factors Review):    ITP Comments:   Comments: Patient to start Cardiac Rehab on Wednesday, December 06, 2015.  Patient plans to attend the 3:45 p.m. Class Monday, Wednesday, and Thursday.  Patient's 8 week lifting restriction on the right chest/side/arm from  [facility] continues through January 24th, 2017.

## 2015-11-29 NOTE — Patient Instructions (Signed)
Patient Instructions  Patient Details  Name: [name] [name] MRN: [id number] Date of Birth: 1941/07/21 Referring Provider:  Thayer Headings, MD  Below are the personal goals you chose as well as exercise and nutrition goals. Our goal is to help you keep on track towards obtaining and maintaining your goals. We will be discussing your progress on these goals with you throughout the program.  Initial Exercise Prescription:     Initial Exercise Prescription - 11/29/15 1600    Date of Initial Exercise Prescription   Date 11/29/15   Treadmill   MPH 2   Grade 0   Minutes 10   Bike   Level 0.4   Minutes 10   Recumbant Bike   Level 3   RPM 40   Watts 25   Minutes 10   NuStep   Level 2   Watts 30   Minutes 15   Arm Ergometer   Level 1   Watts 8   Minutes 10   Arm/Foot Ergometer   Level 4   Watts 12   Minutes 10   Cybex   Level 1   RPM 50   Minutes 10   Recumbant Elliptical   Level 1   RPM 40   Watts 10   Minutes 10   Elliptical   Level 1   Speed 3   Minutes 1   REL-XR   Level 3   Watts 35   Minutes 15   T5 Nustep   Level 2   Watts 20   Minutes 15   Biostep-RELP   Level 3   Watts 20   Minutes 15   Prescription Details   Frequency (times per week) 3   Duration Progress to 30 minutes of continuous aerobic without signs/symptoms of physical distress   Intensity   THRR REST +  30   Ratings of Perceived Exertion 11-15   Progression Continue progressive overload as per policy without signs/symptoms or physical distress.   Resistance Training   Training Prescription Yes   Weight 2   Reps 10-15      Exercise Goals: Frequency: Be able to perform aerobic exercise three times per week working toward 3-5 days per week.  Intensity: Work with a perceived exertion of 11 (fairly light) - 15 (hard) as tolerated. Follow your new exercise prescription and watch for changes in prescription as you progress with the program. Changes will be reviewed with you when  they are made.  Duration: You should be able to do 30 minutes of continuous aerobic exercise in addition to a 5 minute warm-up and a 5 minute cool-down routine.  Nutrition Goals: Your personal nutrition goals will be established when you do your nutrition analysis with the dietician.  The following are nutrition guidelines to follow: Cholesterol < 200mg /day Sodium < 1500mg /day Fiber: Women over 50 yrs - 21 grams per day  Personal Goals:     Personal Goals and Risk Factors at Admission - 11/29/15 1836    Personal Goals and Risk Factors on Admission   Personal Goal Patient would like to be able to make her on bed and vacumn again.     Intervention Other  Start Cardiac Rehab and follow exercise prescription.  Patient's 8 week lifting restriction still in effect at this time.        Tobacco Use Initial Evaluation: History  Smoking status  . Never Smoker   Smokeless tobacco  . Never Used    Copy of goals given to  participant.

## 2015-11-30 ENCOUNTER — Ambulatory Visit (INDEPENDENT_AMBULATORY_CARE_PROVIDER_SITE_OTHER): Payer: Medicare Other | Admitting: *Deleted

## 2015-11-30 DIAGNOSIS — Z953 Presence of xenogenic heart valve: Secondary | ICD-10-CM

## 2015-11-30 DIAGNOSIS — Z8679 Personal history of other diseases of the circulatory system: Secondary | ICD-10-CM | POA: Diagnosis not present

## 2015-11-30 DIAGNOSIS — Z9889 Other specified postprocedural states: Secondary | ICD-10-CM

## 2015-11-30 LAB — POCT INR: INR: 2.5

## 2015-12-03 NOTE — Progress Notes (Signed)
Cardiac Individual Treatment Plan  Patient Details  Name: [name] [name] MRN: [id number] Date of Birth: 1941-01-29 Referring Provider:  Thayer Headings, MD  Initial Encounter Date: Date: 11/29/15  Visit Diagnosis: S/P mitral valve repair - Plan: CARDIAC REHAB 69 DAY REVIEW  S/P placement of cardiac pacemaker  History of chronic atrial fibrillation ([place])  Patient's Home Medications on Admission:  Current outpatient prescriptions:  .  albuterol (PROVENTIL HFA;VENTOLIN HFA) 108 (90 BASE) MCG/ACT inhaler, Inhale 2 puffs into the lungs every 6 (six) hours as needed for wheezing or shortness of breath., Disp: 1 Inhaler, Rfl: 2 .  ALPRAZolam (XANAX) 0.25 MG tablet, Take 1 tablet (0.25 mg total) by mouth at bedtime as needed for anxiety or sleep., Disp: 30 tablet, Rfl: 5 .  amiodarone (PACERONE) 200 MG tablet, Take 1 tablet (200 mg total) by mouth daily., Disp: 30 tablet, Rfl: 0 .  amoxicillin (AMOXIL) 500 MG tablet, Take 4 tablets (2,000 mg total) by mouth once. Take 4 tablets 60 minutes prior to dental work, Disp: 30 tablet, Rfl: 1 .  azelastine (ASTELIN) 0.1 % nasal spray, Place 1 spray into both nostrils daily., Disp: , Rfl: 5 .  butalbital-acetaminophen-caffeine (FIORICET, ESGIC) 50-325-40 MG per tablet, TAKE 1 TABLET BY MOUTH AS NEEDED FOR BACK PAIN OR HEADACHE, Disp: 90 tablet, Rfl: 0 .  furosemide (LASIX) 40 MG tablet, Take 40 mg by mouth daily as needed for fluid., Disp: , Rfl:  .  metoprolol tartrate (LOPRESSOR) 25 MG tablet, Take 0.5 tablets (12.5 mg total) by mouth 2 (two) times daily., Disp: 30 tablet, Rfl: 1 .  montelukast (SINGULAIR) 10 MG tablet, Take 10 mg by mouth daily. , Disp: , Rfl:  .  rosuvastatin (CRESTOR) 5 MG tablet, Take 1 tablet (5 mg total) by mouth every other day., Disp: 45 tablet, Rfl: 3 .  traMADol (ULTRAM) 50 MG tablet, Take 1-2 tablets (50-100 mg total) by mouth every 4 (four) hours as needed for moderate pain., Disp: 30 tablet, Rfl: 0 .  warfarin (COUMADIN)  2 MG tablet, Take 1 tablet (2 mg total) by mouth as directed., Disp: 30 tablet, Rfl: 3  Past Medical History: Past Medical History  Diagnosis Date  . SVT (supraventricular tachycardia) ([place])   . MVP (mitral valve prolapse)   . Hyperlipidemia   . Idiopathic thrombocytopenic purpura (ITP)   . Hemorrhoid   . IBS (irritable bowel syndrome)   . Migraine   . Severe mitral regurgitation   . RBBB   . Anxiety associated with depression     Prn alprazolam   . Thoracic aorta atherosclerosis ([place])   . Nodule of right lung   . Restrictive lung disease     Mild on PFT & likely cardiac in etiology   . 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 Atrial fibrillation, persistent ([place])     DCCV 08/22/2015  . Acute on chronic diastolic (congestive) heart failure ([place])   . 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 S/P Minimally invasive maze operation for atrial fibrillation 10/31/2015    Complete bilateral atrial lesion set using cryothermy and bipolar radiofrequency ablation with clipping of LA appendage via right mini thoracotomy approach  . Bradycardia post-op bradycardia, pacer dependent    MDT PPM 11/06/15, Dr. Lovena Le    Tobacco Use: History  Smoking status  . Never Smoker   Smokeless tobacco  .  Never Used    Labs: Recent Review Flowsheet Data    Labs for ITP Cardiac and Pulmonary Rehab Latest Ref Rng 11/01/2015 11/01/2015 11/01/2015 11/01/2015 11/01/2015   PHART 7.350 - 7.450 7.391 7.391 - 7.226(L) -   PCO2ART 35.0 - 45.0 mmHg 38.8 37.9 - 59.5(HH) -   HCO3 20.0 - 24.0 mEq/L 23.6 23.1 - 24.7(H) -   TCO2 0 - 100 mmol/L 25 24 25 26 22    ACIDBASEDEF 0.0 - 2.0 mmol/L 1.0 2.0 - 4.0(H) -   O2SAT - 97.0 95.0 - 100.0 -       Exercise Target Goals: Date: 11/29/15  Exercise Program Goal: Individual exercise prescription set with THRR, safety &  activity barriers. Participant demonstrates ability to understand and report RPE using BORG scale, to self-measure pulse accurately, and to acknowledge the importance of the exercise prescription.  Exercise Prescription Goal: Starting with aerobic activity 30 plus minutes a day, 3 days per week for initial exercise prescription. Provide home exercise prescription and guidelines that participant acknowledges understanding prior to discharge.  Activity Barriers & Risk Stratification:     Activity Barriers & Risk Stratification - 11/29/15 1849    Activity Barriers & Risk Stratification   Activity Barriers Incisional Pain;History of Falls;Arthritis;Back Problems;Other (comment)   Comments Incisional pain on right chest from MVR/s/p two chest tubes.  Patient is on an eight week lifting restriction of right arm through January 24th, 2017.        6 Minute Walk:     6 Minute Walk      11/29/15 1618       6 Minute Walk   Phase Initial     Distance 1275 feet     Walk Time 6 minutes     Resting HR 88 bpm     Resting BP 118/74 mmHg     Max Ex. HR 113 bpm     Max Ex. BP 126/62 mmHg     RPE 13     Symptoms No        Initial Exercise Prescription:     Initial Exercise Prescription - 11/29/15 1600    Date of Initial Exercise Prescription   Date 11/29/15   Treadmill   MPH 2   Grade 0   Minutes 10   Bike   Level 0.4   Minutes 10   Recumbant Bike   Level 3   RPM 40   Watts 25   Minutes 10   NuStep   Level 2   Watts 30   Minutes 15   Arm Ergometer   Level 1   Watts 8   Minutes 10   Arm/Foot Ergometer   Level 4   Watts 12   Minutes 10   Cybex   Level 1   RPM 50   Minutes 10   Recumbant Elliptical   Level 1   RPM 40   Watts 10   Minutes 10   Elliptical   Level 1   Speed 3   Minutes 1   REL-XR   Level 3   Watts 35   Minutes 15   T5 Nustep   Level 2   Watts 20   Minutes 15   Biostep-RELP   Level 3   Watts 20   Minutes 15   Prescription Details    Frequency (times per week) 3   Duration Progress to 30 minutes of continuous aerobic without signs/symptoms of physical distress   Intensity   THRR REST +  30   Ratings of Perceived Exertion 11-15   Progression Continue progressive overload as per policy without signs/symptoms or physical distress.   Resistance Training   Training Prescription Yes   Weight 2   Reps 10-15      Exercise Prescription Changes:   Discharge Exercise Prescription (Final Exercise Prescription Changes):   Nutrition:  Target Goals: Understanding of nutrition guidelines, daily intake of sodium 1500mg , cholesterol 200mg , calories 30% from fat and 7% or less from saturated fats, daily to have 5 or more servings of fruits and vegetables.  Biometrics:     Pre Biometrics - 11/29/15 1617    Pre Biometrics   Height 5\' 5"  (1.651 m)   Weight 140 lb 6.4 oz (63.685 kg)   Waist Circumference 32 inches   Hip Circumference 39.5 inches   Waist to Hip Ratio 0.81 %   BMI (Calculated) 23.4       Nutrition Therapy Plan and Nutrition Goals:   Nutrition Discharge: Rate Your Plate Scores:   Nutrition Goals Re-Evaluation:   Psychosocial: Target Goals: Acknowledge presence or absence of depression, maximize coping skills, provide positive support system. Participant is able to verbalize types and ability to use techniques and skills needed for reducing stress and depression.  Initial Review & Psychosocial Screening:     Initial Psych Review & Screening - 11/29/15 [place]? Yes   Comments Pt has good support system, as she has one adult son and adult daughter and 4 grand children.     Barriers   Psychosocial barriers to participate in program There are no identifiable barriers or psychosocial needs.   Screening Interventions   Interventions Encouraged to exercise;Program counselor consult      Quality of Life Scores:     Quality of Life - 11/30/15 1143    Quality  of Life Scores   Health/Function Pre 19.73 %   Socioeconomic Pre 30 %   Psych/Spiritual Pre 23.79 %   Family Pre 27 %   GLOBAL Pre 23.7 %      PHQ-9:     Recent Review Flowsheet Data    Depression screen [facility] 2/9 11/29/2015 02/02/2015   Decreased Interest 0 0   Down, Depressed, Hopeless 0 0   PHQ - 2 Score 0 0   Altered sleeping 1 -   Tired, decreased energy 1 -   Change in appetite 0 -   Feeling bad or failure about yourself  0 -   Trouble concentrating 0 -   Moving slowly or fidgety/restless 0 -   Suicidal thoughts 0 -   PHQ-9 Score 2 -   Difficult doing work/chores Not difficult at all -      Psychosocial Evaluation and Intervention:   Psychosocial Re-Evaluation:   Vocational Rehabilitation: Provide vocational rehab assistance to qualifying candidates.   Vocational Rehab Evaluation & Intervention:     Vocational Rehab - 11/29/15 1819    Initial Vocational Rehab Evaluation & Intervention   Assessment shows need for Vocational Rehabilitation No      Education: Education Goals: Education classes will be provided on a weekly basis, covering required topics. Participant will state understanding/return demonstration of topics presented.  Learning Barriers/Preferences:     Learning Barriers/Preferences - 11/29/15 1816    Learning Barriers/Preferences   Learning Barriers None   Learning Preferences None      Education Topics: General Nutrition Guidelines/Fats and Fiber: -Group instruction provided by verbal, written material, models and posters  to present the general guidelines for heart healthy nutrition. Gives an explanation and review of dietary fats and fiber.   Controlling Sodium/Reading Food Labels: -Group verbal and written material supporting the discussion of sodium use in heart healthy nutrition. Review and explanation with models, verbal and written materials for utilization of the food label.   Exercise Physiology & Risk Factors: - Group  verbal and written instruction with models to review the exercise physiology of the cardiovascular system and associated critical values. Details cardiovascular disease risk factors and the goals associated with each risk factor.   Aerobic Exercise & Resistance Training: - Gives group verbal and written discussion on the health impact of inactivity. On the components of aerobic and resistive training programs and the benefits of this training and how to safely progress through these programs.   Flexibility, Balance, General Exercise Guidelines: - Provides group verbal and written instruction on the benefits of flexibility and balance training programs. Provides general exercise guidelines with specific guidelines to those with heart or lung disease. Demonstration and skill practice provided.   Stress Management: - Provides group verbal and written instruction about the health risks of elevated stress, cause of high stress, and healthy ways to reduce stress.   Depression: - Provides group verbal and written instruction on the correlation between heart/lung disease and depressed mood, treatment options, and the stigmas associated with seeking treatment.   Anatomy & Physiology of the Heart: - Group verbal and written instruction and models provide basic cardiac anatomy and physiology, with the coronary electrical and arterial systems. Review of: AMI, Angina, Valve disease, Heart Failure, Cardiac Arrhythmia, Pacemakers, and the ICD.   Cardiac Procedures: - Group verbal and written instruction and models to describe the testing methods done to diagnose heart disease. Reviews the outcomes of the test results. Describes the treatment choices: Medical Management, Angioplasty, or Coronary Bypass Surgery.   Cardiac Medications: - Group verbal and written instruction to review commonly prescribed medications for heart disease. Reviews the medication, class of the drug, and side effects. Includes the  steps to properly store meds and maintain the prescription regimen.   Go Sex-Intimacy & Heart Disease, Get SMART - Goal Setting: - Group verbal and written instruction through game format to discuss heart disease and the return to sexual intimacy. Provides group verbal and written material to discuss and apply goal setting through the application of the S.M.A.R.T. Method.   Other Matters of the Heart: - Provides group verbal, written materials and models to describe Heart Failure, Angina, Valve Disease, and Diabetes in the realm of heart disease. Includes description of the disease process and treatment options available to the cardiac patient.   Exercise & Equipment Safety: - Individual verbal instruction and demonstration of equipment use and safety with use of the equipment.          Cardiac Rehab from 11/29/2015 in [facility] Cardiac and Pulmonary Rehab   Date  11/29/15   Educator  DW   Instruction Review Code  1- partially meets, needs review/practice      Infection Prevention: - Provides verbal and written material to individual with discussion of infection control including proper hand washing and proper equipment cleaning during exercise session.      Cardiac Rehab from 11/29/2015 in [facility] Cardiac and Pulmonary Rehab   Date  11/29/15   Educator  DW   Instruction Review Code  2- meets goals/outcomes      Falls Prevention: - Provides verbal and written material to individual with discussion  of falls prevention and safety.      Cardiac Rehab from 11/29/2015 in [facility] Cardiac and Pulmonary Rehab   Date  11/29/15   Educator  DW   Instruction Review Code  2- meets goals/outcomes      Diabetes: - Individual verbal and written instruction to review signs/symptoms of diabetes, desired ranges of glucose level fasting, after meals and with exercise. Advice that pre and post exercise glucose checks will be done for 3 sessions at entry of program.    Knowledge Questionnaire Score:      Knowledge Questionnaire Score - 11/29/15 1826    Knowledge Questionnaire Score   Pre Score 27//28      Personal Goals and Risk Factors at Admission:     Personal Goals and Risk Factors at Admission - 11/29/15 1836    Personal Goals and Risk Factors on Admission   Personal Goal Patient would like to be able to make her on bed and vacumn again.     Intervention Other  Start Cardiac Rehab and follow exercise prescription.  Patient's 8 week lifting restriction still in effect at this time.        Personal Goals and Risk Factors Review:    Personal Goals Discharge (Final Personal Goals and Risk Factors Review):    ITP Comments:     ITP Comments      12/03/15 1406           ITP Comments Ready for 30 day review.  Continue with ITP   New to program, has attended orientation will start sessions soon.          Comments:

## 2015-12-06 ENCOUNTER — Encounter: Payer: Medicare Other | Attending: Cardiovascular Disease | Admitting: *Deleted

## 2015-12-06 ENCOUNTER — Ambulatory Visit (INDEPENDENT_AMBULATORY_CARE_PROVIDER_SITE_OTHER): Payer: Medicare Other

## 2015-12-06 DIAGNOSIS — Z950 Presence of cardiac pacemaker: Secondary | ICD-10-CM | POA: Diagnosis not present

## 2015-12-06 DIAGNOSIS — I4891 Unspecified atrial fibrillation: Secondary | ICD-10-CM | POA: Diagnosis not present

## 2015-12-06 DIAGNOSIS — Z9889 Other specified postprocedural states: Secondary | ICD-10-CM | POA: Diagnosis not present

## 2015-12-06 DIAGNOSIS — Z953 Presence of xenogenic heart valve: Secondary | ICD-10-CM

## 2015-12-06 DIAGNOSIS — Z8679 Personal history of other diseases of the circulatory system: Secondary | ICD-10-CM | POA: Diagnosis not present

## 2015-12-06 LAB — POCT INR: INR: 2.2

## 2015-12-06 NOTE — Progress Notes (Signed)
Daily Session Note  Patient Details  Name: [name] [name] MRN: [id number] Date of Birth: 11/21/1941 Referring Provider:  Nahser, Philip J, MD  Encounter Date: 12/06/2015  Check In:     Session Check In - 12/06/15 1619    Check-In   Staff Present Carroll Enterkin, RN, BSN;Renee MacMillan, MS, ACSM CEP, Exercise Physiologist; , RN, BSN   ER physicians immediately available to respond to emergencies See telemetry face sheet for immediately available ER MD   Medication changes reported     No   Fall or balance concerns reported    No   Warm-up and Cool-down Performed on first and last piece of equipment   VAD Patient? No   Pain Assessment   Currently in Pain? No/denies         Goals Met:  Exercise tolerated well Personal goals reviewed No report of cardiac concerns or symptoms Strength training completed today  Goals Unmet:  Not Applicable  Goals Comments:  First day of exercise! Patient was oriented to the gym and the equipment functions and settings. Procedures and policies of the gym were outlined and explained. The patient's individual exercise prescription and treatment plan were reviewed with them. All starting workloads were established based on the results of the functional testing  done at the initial intake visit. The plan for exercise progression was also introduced and progression will be customized based on the patient's performance and goals.   Dr. Mark Miller is Medical Director for [facility] and [facility]. 

## 2015-12-06 NOTE — Addendum Note (Signed)
Addended by: Lynford Humphrey on: 12/06/2015 11:48 AM   Modules accepted: Orders

## 2015-12-07 VITALS — BP 70/40

## 2015-12-07 DIAGNOSIS — Z9889 Other specified postprocedural states: Secondary | ICD-10-CM | POA: Diagnosis not present

## 2015-12-07 NOTE — Progress Notes (Signed)
Cardiac Individual Treatment Plan  Patient Details  Name: AMABELLA PETITHOMME MRN: [id number] Date of Birth: [birth year] Referring Provider:  Thayer Headings, MD  Initial Encounter Date:    Visit Diagnosis: S/P mitral valve repair  Patient's Home Medications on Admission:  Current outpatient prescriptions:  .  albuterol (PROVENTIL HFA;VENTOLIN HFA) 108 (90 BASE) MCG/ACT inhaler, Inhale 2 puffs into the lungs every 6 (six) hours as needed for wheezing or shortness of breath., Disp: 1 Inhaler, Rfl: 2 .  ALPRAZolam (XANAX) 0.25 MG tablet, Take 1 tablet (0.25 mg total) by mouth at bedtime as needed for anxiety or sleep., Disp: 30 tablet, Rfl: 5 .  amiodarone (PACERONE) 200 MG tablet, Take 1 tablet (200 mg total) by mouth daily., Disp: 30 tablet, Rfl: 0 .  amoxicillin (AMOXIL) 500 MG tablet, Take 4 tablets (2,000 mg total) by mouth once. Take 4 tablets 60 minutes prior to dental work, Disp: 30 tablet, Rfl: 1 .  azelastine (ASTELIN) 0.1 % nasal spray, Place 1 spray into both nostrils daily., Disp: , Rfl: 5 .  butalbital-acetaminophen-caffeine (FIORICET, ESGIC) 50-325-40 MG per tablet, TAKE 1 TABLET BY MOUTH AS NEEDED FOR BACK PAIN OR HEADACHE, Disp: 90 tablet, Rfl: 0 .  furosemide (LASIX) 40 MG tablet, Take 40 mg by mouth daily as needed for fluid., Disp: , Rfl:  .  metoprolol tartrate (LOPRESSOR) 25 MG tablet, Take 0.5 tablets (12.5 mg total) by mouth 2 (two) times daily., Disp: 30 tablet, Rfl: 1 .  montelukast (SINGULAIR) 10 MG tablet, Take 10 mg by mouth daily. , Disp: , Rfl:  .  rosuvastatin (CRESTOR) 5 MG tablet, Take 1 tablet (5 mg total) by mouth every other day., Disp: 45 tablet, Rfl: 3 .  traMADol (ULTRAM) 50 MG tablet, Take 1-2 tablets (50-100 mg total) by mouth every 4 (four) hours as needed for moderate pain., Disp: 30 tablet, Rfl: 0 .  warfarin (COUMADIN) 2 MG tablet, Take 1 tablet (2 mg total) by mouth as directed., Disp: 30 tablet, Rfl: 3  Past Medical History: Past Medical History   Diagnosis Date  . SVT (supraventricular tachycardia) ([place])   . MVP (mitral valve prolapse)   . Hyperlipidemia   . Idiopathic thrombocytopenic purpura (ITP)   . Hemorrhoid   . IBS (irritable bowel syndrome)   . Migraine   . Severe mitral regurgitation   . RBBB   . Anxiety associated with depression     Prn alprazolam   . Thoracic aorta atherosclerosis ([place])   . Nodule of right lung   . Restrictive lung disease     Mild on PFT & likely cardiac in etiology   . 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 Atrial fibrillation, persistent ([place])     DCCV 08/22/2015  . Acute on chronic diastolic (congestive) heart failure ([place])   . 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 S/P Minimally invasive maze operation for atrial fibrillation 10/31/2015    Complete bilateral atrial lesion set using cryothermy and bipolar radiofrequency ablation with clipping of LA appendage via right mini thoracotomy approach  . Bradycardia post-op bradycardia, pacer dependent    MDT PPM 11/06/15, Dr. Lovena Le    Tobacco Use: History  Smoking status  . Never Smoker   Smokeless tobacco  . Never Used    Labs: Recent Review Flowsheet Data    Labs for ITP Cardiac and Pulmonary Rehab  Latest Ref Rng 11/01/2015 11/01/2015 11/01/2015 11/01/2015 11/01/2015   PHART 7.350 - 7.450 7.391 7.391 - 7.226(L) -   PCO2ART 35.0 - 45.0 mmHg 38.8 37.9 - 59.5(HH) -   HCO3 20.0 - 24.0 mEq/L 23.6 23.1 - 24.7(H) -   TCO2 0 - 100 mmol/L 25 24 25 26 22    ACIDBASEDEF 0.0 - 2.0 mmol/L 1.0 2.0 - 4.0(H) -   O2SAT - 97.0 95.0 - 100.0 -       Exercise Target Goals:    Exercise Program Goal: Individual exercise prescription set with THRR, safety & activity barriers. Participant demonstrates ability to understand and report RPE using BORG scale, to self-measure pulse accurately, and to acknowledge  the importance of the exercise prescription.  Exercise Prescription Goal: Starting with aerobic activity 30 plus minutes a day, 3 days per week for initial exercise prescription. Provide home exercise prescription and guidelines that participant acknowledges understanding prior to discharge.  Activity Barriers & Risk Stratification:     Activity Barriers & Risk Stratification - 11/29/15 1849    Activity Barriers & Risk Stratification   Activity Barriers Incisional Pain;History of Falls;Arthritis;Back Problems;Other (comment)   Comments Incisional pain on right chest from MVR/s/p two chest tubes.  Patient is on an eight week lifting restriction of right arm through January 24th, 2017.        6 Minute Walk:     6 Minute Walk      11/29/15 1618       6 Minute Walk   Phase Initial     Distance 1275 feet     Walk Time 6 minutes     Resting HR 88 bpm     Resting BP 118/74 mmHg     Max Ex. HR 113 bpm     Max Ex. BP 126/62 mmHg     RPE 13     Symptoms No        Initial Exercise Prescription:     Initial Exercise Prescription - 11/29/15 1600    Date of Initial Exercise Prescription   Date 11/29/15   Treadmill   MPH 2   Grade 0   Minutes 10   Bike   Level 0.4   Minutes 10   Recumbant Bike   Level 3   RPM 40   Watts 25   Minutes 10   NuStep   Level 2   Watts 30   Minutes 15   Arm Ergometer   Level 1   Watts 8   Minutes 10   Arm/Foot Ergometer   Level 4   Watts 12   Minutes 10   Cybex   Level 1   RPM 50   Minutes 10   Recumbant Elliptical   Level 1   RPM 40   Watts 10   Minutes 10   Elliptical   Level 1   Speed 3   Minutes 1   REL-XR   Level 3   Watts 35   Minutes 15   T5 Nustep   Level 2   Watts 20   Minutes 15   Biostep-RELP   Level 3   Watts 20   Minutes 15   Prescription Details   Frequency (times per week) 3   Duration Progress to 30 minutes of continuous aerobic without signs/symptoms of physical distress   Intensity   THRR REST  +  30   Ratings of Perceived Exertion 11-15   Progression Continue progressive overload as per policy without signs/symptoms or  physical distress.   Resistance Training   Training Prescription Yes   Weight 2   Reps 10-15      Exercise Prescription Changes:   Discharge Exercise Prescription (Final Exercise Prescription Changes):   Nutrition:  Target Goals: Understanding of nutrition guidelines, daily intake of sodium 1500mg , cholesterol 200mg , calories 30% from fat and 7% or less from saturated fats, daily to have 5 or more servings of fruits and vegetables.  Biometrics:     Pre Biometrics - 11/29/15 1617    Pre Biometrics   Height 5\' 5"  (1.651 m)   Weight 140 lb 6.4 oz (63.685 kg)   Waist Circumference 32 inches   Hip Circumference 39.5 inches   Waist to Hip Ratio 0.81 %   BMI (Calculated) 23.4       Nutrition Therapy Plan and Nutrition Goals:   Nutrition Discharge: Rate Your Plate Scores:   Nutrition Goals Re-Evaluation:     Nutrition Goals Re-Evaluation      12/07/15 1720           Personal Goal #1 Re-Evaluation   Personal Goal #1 Izora Gala said she has cut out salt so she has not needed to really take her prn Lasix except for about 2 times.        Goal Progress Seen Yes          Psychosocial: Target Goals: Acknowledge presence or absence of depression, maximize coping skills, provide positive support system. Participant is able to verbalize types and ability to use techniques and skills needed for reducing stress and depression.  Initial Review & Psychosocial Screening:     Initial Psych Review & Screening - 11/29/15 [place]? Yes   Comments Pt has good support system, as she has one adult son and adult daughter and 4 grand children.     Barriers   Psychosocial barriers to participate in program There are no identifiable barriers or psychosocial needs.   Screening Interventions   Interventions Encouraged to  exercise;Program counselor consult      Quality of Life Scores:     Quality of Life - 11/30/15 1143    Quality of Life Scores   Health/Function Pre 19.73 %   Socioeconomic Pre 30 %   Psych/Spiritual Pre 23.79 %   Family Pre 27 %   GLOBAL Pre 23.7 %      PHQ-9:     Recent Review Flowsheet Data    Depression screen [facility] 2/9 11/29/2015 02/02/2015   Decreased Interest 0 0   Down, Depressed, Hopeless 0 0   PHQ - 2 Score 0 0   Altered sleeping 1 -   Tired, decreased energy 1 -   Change in appetite 0 -   Feeling bad or failure about yourself  0 -   Trouble concentrating 0 -   Moving slowly or fidgety/restless 0 -   Suicidal thoughts 0 -   PHQ-9 Score 2 -   Difficult doing work/chores Not difficult at all -      Psychosocial Evaluation and Intervention:   Psychosocial Re-Evaluation:     Psychosocial Re-Evaluation      12/07/15 1723           Psychosocial Re-Evaluation   Interventions Encouraged to attend Cardiac Rehabilitation for the exercise       Comments Kyrene said "I will be here every time I am suppose to but I have never liked to exercise. I suggested for her to bring  her Kindle to read a book while exercising on the Nustep. I suggested a book on tape but Darnae said she doesn't like anything in her ear. She will try her Kindle to pass time in Cardiac rehab.          Vocational Rehabilitation: Provide vocational rehab assistance to qualifying candidates.   Vocational Rehab Evaluation & Intervention:     Vocational Rehab - 11/29/15 1819    Initial Vocational Rehab Evaluation & Intervention   Assessment shows need for Vocational Rehabilitation No      Education: Education Goals: Education classes will be provided on a weekly basis, covering required topics. Participant will state understanding/return demonstration of topics presented.  Learning Barriers/Preferences:     Learning Barriers/Preferences - 11/29/15 1816    Learning Barriers/Preferences    Learning Barriers None   Learning Preferences None      Education Topics: General Nutrition Guidelines/Fats and Fiber: -Group instruction provided by verbal, written material, models and posters to present the general guidelines for heart healthy nutrition. Gives an explanation and review of dietary fats and fiber.   Controlling Sodium/Reading Food Labels: -Group verbal and written material supporting the discussion of sodium use in heart healthy nutrition. Review and explanation with models, verbal and written materials for utilization of the food label.   Exercise Physiology & Risk Factors: - Group verbal and written instruction with models to review the exercise physiology of the cardiovascular system and associated critical values. Details cardiovascular disease risk factors and the goals associated with each risk factor.          Cardiac Rehab from 12/06/2015 in [facility] Cardiac and Pulmonary Rehab   Date  12/06/15   Educator  Dorisann Frames   Instruction Review Code  2- meets goals/outcomes      Aerobic Exercise & Resistance Training: - Gives group verbal and written discussion on the health impact of inactivity. On the components of aerobic and resistive training programs and the benefits of this training and how to safely progress through these programs.   Flexibility, Balance, General Exercise Guidelines: - Provides group verbal and written instruction on the benefits of flexibility and balance training programs. Provides general exercise guidelines with specific guidelines to those with heart or lung disease. Demonstration and skill practice provided.   Stress Management: - Provides group verbal and written instruction about the health risks of elevated stress, cause of high stress, and healthy ways to reduce stress.   Depression: - Provides group verbal and written instruction on the correlation between heart/lung disease and depressed mood, treatment options, and the stigmas  associated with seeking treatment.   Anatomy & Physiology of the Heart: - Group verbal and written instruction and models provide basic cardiac anatomy and physiology, with the coronary electrical and arterial systems. Review of: AMI, Angina, Valve disease, Heart Failure, Cardiac Arrhythmia, Pacemakers, and the ICD.   Cardiac Procedures: - Group verbal and written instruction and models to describe the testing methods done to diagnose heart disease. Reviews the outcomes of the test results. Describes the treatment choices: Medical Management, Angioplasty, or Coronary Bypass Surgery.   Cardiac Medications: - Group verbal and written instruction to review commonly prescribed medications for heart disease. Reviews the medication, class of the drug, and side effects. Includes the steps to properly store meds and maintain the prescription regimen.   Go Sex-Intimacy & Heart Disease, Get SMART - Goal Setting: - Group verbal and written instruction through game format to discuss heart disease and the return to  sexual intimacy. Provides group verbal and written material to discuss and apply goal setting through the application of the S.M.A.R.T. Method.   Other Matters of the Heart: - Provides group verbal, written materials and models to describe Heart Failure, Angina, Valve Disease, and Diabetes in the realm of heart disease. Includes description of the disease process and treatment options available to the cardiac patient.   Exercise & Equipment Safety: - Individual verbal instruction and demonstration of equipment use and safety with use of the equipment.      Cardiac Rehab from 12/06/2015 in [facility] Cardiac and Pulmonary Rehab   Date  11/29/15   Educator  DW   Instruction Review Code  1- partially meets, needs review/practice      Infection Prevention: - Provides verbal and written material to individual with discussion of infection control including proper hand washing and proper equipment  cleaning during exercise session.      Cardiac Rehab from 12/06/2015 in [facility] Cardiac and Pulmonary Rehab   Date  11/29/15   Educator  DW   Instruction Review Code  2- meets goals/outcomes      Falls Prevention: - Provides verbal and written material to individual with discussion of falls prevention and safety.      Cardiac Rehab from 12/07/2015 in [facility] Cardiac and Pulmonary Rehab   Date  12/07/15   Educator  C. EnterkinRN   Instruction Review Code  2- meets goals/outcomes [Discussed when blood pressure was 70/40 to drink water first]      Diabetes: - Individual verbal and written instruction to review signs/symptoms of diabetes, desired ranges of glucose level fasting, after meals and with exercise. Advice that pre and post exercise glucose checks will be done for 3 sessions at entry of program.    Knowledge Questionnaire Score:     Knowledge Questionnaire Score - 11/29/15 1826    Knowledge Questionnaire Score   Pre Score 27//28      Personal Goals and Risk Factors at Admission:     Personal Goals and Risk Factors at Admission - 11/29/15 1836    Personal Goals and Risk Factors on Admission   Personal Goal Patient would like to be able to make her on bed and vacumn again.     Intervention Other  Start Cardiac Rehab and follow exercise prescription.  Patient's 8 week lifting restriction still in effect at this time.        Personal Goals and Risk Factors Review:      Goals and Risk Factor Review      12/07/15 1721           Increase Aerobic Exercise and Physical Activity   Goals Progress/Improvement seen  Yes       Comments Hristina said "I will be here every time I am suppose to but I have never liked to exercise. I suggested for her to bring her Kindle to read a book while exercising on the Nustep.        Abnormal Lipids   Goal --  Leisly will follow up with her MD about her cholesterol. still taking statin.          Personal Goals Discharge (Final Personal Goals  and Risk Factors Review):      Goals and Risk Factor Review - 12/07/15 1721    Increase Aerobic Exercise and Physical Activity   Goals Progress/Improvement seen  Yes   Comments Kerryn said "I will be here every time I am suppose to but I have never  liked to exercise. I suggested for her to bring her Kindle to read a book while exercising on the Nustep.    Abnormal Lipids   Goal --  Francenia will follow up with her MD about her cholesterol. still taking statin.      ITP Comments:     ITP Comments      12/03/15 1406           ITP Comments Ready for 30 day review.  Continue with ITP   New to program, has attended orientation will start sessions soon.          Comments: Cassey said "I will be here every time I am suppose to but I have never liked to exercise. I suggested for her to bring her Kindle to read a book while exercising sitting down on the Nustep. I suggested a book on tape but Mickenzie said she doesn't like anything in her ear. She will try her Kindle to pass time while exercising in Cardiac rehab. Shaila said she does not like to walk outside of here since she had a bad fall several years ago which required plates in her jaw and she lost 15 lbs then.

## 2015-12-07 NOTE — Progress Notes (Signed)
Cardiac Individual Treatment Plan  Patient Details  Name: [name] [name] MRN: [id number] Date of Birth: 1941/01/21 Referring Provider:  Thayer Headings, MD  Initial Encounter Date:    Visit Diagnosis: S/P mitral valve repair  Patient's Home Medications on Admission:  Current outpatient prescriptions:  .  albuterol (PROVENTIL HFA;VENTOLIN HFA) 108 (90 BASE) MCG/ACT inhaler, Inhale 2 puffs into the lungs every 6 (six) hours as needed for wheezing or shortness of breath., Disp: 1 Inhaler, Rfl: 2 .  ALPRAZolam (XANAX) 0.25 MG tablet, Take 1 tablet (0.25 mg total) by mouth at bedtime as needed for anxiety or sleep., Disp: 30 tablet, Rfl: 5 .  amiodarone (PACERONE) 200 MG tablet, Take 1 tablet (200 mg total) by mouth daily., Disp: 30 tablet, Rfl: 0 .  amoxicillin (AMOXIL) 500 MG tablet, Take 4 tablets (2,000 mg total) by mouth once. Take 4 tablets 60 minutes prior to dental work, Disp: 30 tablet, Rfl: 1 .  azelastine (ASTELIN) 0.1 % nasal spray, Place 1 spray into both nostrils daily., Disp: , Rfl: 5 .  butalbital-acetaminophen-caffeine (FIORICET, ESGIC) 50-325-40 MG per tablet, TAKE 1 TABLET BY MOUTH AS NEEDED FOR BACK PAIN OR HEADACHE, Disp: 90 tablet, Rfl: 0 .  furosemide (LASIX) 40 MG tablet, Take 40 mg by mouth daily as needed for fluid., Disp: , Rfl:  .  metoprolol tartrate (LOPRESSOR) 25 MG tablet, Take 0.5 tablets (12.5 mg total) by mouth 2 (two) times daily., Disp: 30 tablet, Rfl: 1 .  montelukast (SINGULAIR) 10 MG tablet, Take 10 mg by mouth daily. , Disp: , Rfl:  .  rosuvastatin (CRESTOR) 5 MG tablet, Take 1 tablet (5 mg total) by mouth every other day., Disp: 45 tablet, Rfl: 3 .  traMADol (ULTRAM) 50 MG tablet, Take 1-2 tablets (50-100 mg total) by mouth every 4 (four) hours as needed for moderate pain., Disp: 30 tablet, Rfl: 0 .  warfarin (COUMADIN) 2 MG tablet, Take 1 tablet (2 mg total) by mouth as directed., Disp: 30 tablet, Rfl: 3  Past Medical History: Past Medical History   Diagnosis Date  . SVT (supraventricular tachycardia) ([place])   . MVP (mitral valve prolapse)   . Hyperlipidemia   . Idiopathic thrombocytopenic purpura (ITP)   . Hemorrhoid   . IBS (irritable bowel syndrome)   . Migraine   . Severe mitral regurgitation   . RBBB   . Anxiety associated with depression     Prn alprazolam   . Thoracic aorta atherosclerosis ([place])   . Nodule of right lung   . Restrictive lung disease     Mild on PFT & likely cardiac in etiology   . 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 Atrial fibrillation, persistent ([employer])     DCCV 08/22/2015  . Acute on chronic diastolic (congestive) heart failure ([place])   . 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 S/P Minimally invasive maze operation for atrial fibrillation 10/31/2015    Complete bilateral atrial lesion set using cryothermy and bipolar radiofrequency ablation with clipping of LA appendage via right mini thoracotomy approach  . Bradycardia post-op bradycardia, pacer dependent    MDT PPM 11/06/15, Dr. Lovena Le    Tobacco Use: History  Smoking status  . Never Smoker   Smokeless tobacco  . Never Used    Labs: Recent Review Flowsheet Data    Labs for ITP Cardiac and Pulmonary Rehab  Latest Ref Rng 11/01/2015 11/01/2015 11/01/2015 11/01/2015 11/01/2015   PHART 7.350 - 7.450 7.391 7.391 - 7.226(L) -   PCO2ART 35.0 - 45.0 mmHg 38.8 37.9 - 59.5(HH) -   HCO3 20.0 - 24.0 mEq/L 23.6 23.1 - 24.7(H) -   TCO2 0 - 100 mmol/L 25 24 25 26 22    ACIDBASEDEF 0.0 - 2.0 mmol/L 1.0 2.0 - 4.0(H) -   O2SAT - 97.0 95.0 - 100.0 -       Exercise Target Goals:    Exercise Program Goal: Individual exercise prescription set with THRR, safety & activity barriers. Participant demonstrates ability to understand and report RPE using BORG scale, to self-measure pulse accurately, and to acknowledge  the importance of the exercise prescription.  Exercise Prescription Goal: Starting with aerobic activity 30 plus minutes a day, 3 days per week for initial exercise prescription. Provide home exercise prescription and guidelines that participant acknowledges understanding prior to discharge.  Activity Barriers & Risk Stratification:     Activity Barriers & Risk Stratification - 11/29/15 1849    Activity Barriers & Risk Stratification   Activity Barriers Incisional Pain;History of Falls;Arthritis;Back Problems;Other (comment)   Comments Incisional pain on right chest from MVR/s/p two chest tubes.  Patient is on an eight week lifting restriction of right arm through January 24th, 2017.        6 Minute Walk:     6 Minute Walk      11/29/15 1618       6 Minute Walk   Phase Initial     Distance 1275 feet     Walk Time 6 minutes     Resting HR 88 bpm     Resting BP 118/74 mmHg     Max Ex. HR 113 bpm     Max Ex. BP 126/62 mmHg     RPE 13     Symptoms No        Initial Exercise Prescription:     Initial Exercise Prescription - 11/29/15 1600    Date of Initial Exercise Prescription   Date 11/29/15   Treadmill   MPH 2   Grade 0   Minutes 10   Bike   Level 0.4   Minutes 10   Recumbant Bike   Level 3   RPM 40   Watts 25   Minutes 10   NuStep   Level 2   Watts 30   Minutes 15   Arm Ergometer   Level 1   Watts 8   Minutes 10   Arm/Foot Ergometer   Level 4   Watts 12   Minutes 10   Cybex   Level 1   RPM 50   Minutes 10   Recumbant Elliptical   Level 1   RPM 40   Watts 10   Minutes 10   Elliptical   Level 1   Speed 3   Minutes 1   REL-XR   Level 3   Watts 35   Minutes 15   T5 Nustep   Level 2   Watts 20   Minutes 15   Biostep-RELP   Level 3   Watts 20   Minutes 15   Prescription Details   Frequency (times per week) 3   Duration Progress to 30 minutes of continuous aerobic without signs/symptoms of physical distress   Intensity   THRR REST  +  30   Ratings of Perceived Exertion 11-15   Progression Continue progressive overload as per policy without signs/symptoms or  physical distress.   Resistance Training   Training Prescription Yes   Weight 2   Reps 10-15      Exercise Prescription Changes:   Discharge Exercise Prescription (Final Exercise Prescription Changes):   Nutrition:  Target Goals: Understanding of nutrition guidelines, daily intake of sodium 1500mg , cholesterol 200mg , calories 30% from fat and 7% or less from saturated fats, daily to have 5 or more servings of fruits and vegetables.  Biometrics:     Pre Biometrics - 11/29/15 1617    Pre Biometrics   Height 5\' 5"  (1.651 m)   Weight 140 lb 6.4 oz (63.685 kg)   Waist Circumference 32 inches   Hip Circumference 39.5 inches   Waist to Hip Ratio 0.81 %   BMI (Calculated) 23.4       Nutrition Therapy Plan and Nutrition Goals:   Nutrition Discharge: Rate Your Plate Scores:   Nutrition Goals Re-Evaluation:     Nutrition Goals Re-Evaluation      12/07/15 1720           Personal Goal #1 Re-Evaluation   Personal Goal #1 Izora Gala said she has cut out salt so she has not needed to really take her prn Lasix except for about 2 times.        Goal Progress Seen Yes          Psychosocial: Target Goals: Acknowledge presence or absence of depression, maximize coping skills, provide positive support system. Participant is able to verbalize types and ability to use techniques and skills needed for reducing stress and depression.  Initial Review & Psychosocial Screening:     Initial Psych Review & Screening - 11/29/15 Davy? Yes   Comments Pt has good support system, as she has one adult son and adult daughter and 4 grand children.     Barriers   Psychosocial barriers to participate in program There are no identifiable barriers or psychosocial needs.   Screening Interventions   Interventions Encouraged to  exercise;Program counselor consult      Quality of Life Scores:     Quality of Life - 11/30/15 1143    Quality of Life Scores   Health/Function Pre 19.73 %   Socioeconomic Pre 30 %   Psych/Spiritual Pre 23.79 %   Family Pre 27 %   GLOBAL Pre 23.7 %      PHQ-9:     Recent Review Flowsheet Data    Depression screen [facility] 2/9 11/29/2015 02/02/2015   Decreased Interest 0 0   Down, Depressed, Hopeless 0 0   PHQ - 2 Score 0 0   Altered sleeping 1 -   Tired, decreased energy 1 -   Change in appetite 0 -   Feeling bad or failure about yourself  0 -   Trouble concentrating 0 -   Moving slowly or fidgety/restless 0 -   Suicidal thoughts 0 -   PHQ-9 Score 2 -   Difficult doing work/chores Not difficult at all -      Psychosocial Evaluation and Intervention:   Psychosocial Re-Evaluation:     Psychosocial Re-Evaluation      12/07/15 1723           Psychosocial Re-Evaluation   Interventions Encouraged to attend Cardiac Rehabilitation for the exercise       Comments Tram said "I will be here every time I am suppose to but I have never liked to exercise. I suggested for her to bring  her Kindle to read a book while exercising on the Nustep. I suggested a book on tape but Anarah said she doesn't like anything in her ear. She will try her Kindle to pass time in Cardiac rehab.          Vocational Rehabilitation: Provide vocational rehab assistance to qualifying candidates.   Vocational Rehab Evaluation & Intervention:     Vocational Rehab - 11/29/15 1819    Initial Vocational Rehab Evaluation & Intervention   Assessment shows need for Vocational Rehabilitation No      Education: Education Goals: Education classes will be provided on a weekly basis, covering required topics. Participant will state understanding/return demonstration of topics presented.  Learning Barriers/Preferences:     Learning Barriers/Preferences - 11/29/15 1816    Learning Barriers/Preferences    Learning Barriers None   Learning Preferences None      Education Topics: General Nutrition Guidelines/Fats and Fiber: -Group instruction provided by verbal, written material, models and posters to present the general guidelines for heart healthy nutrition. Gives an explanation and review of dietary fats and fiber.   Controlling Sodium/Reading Food Labels: -Group verbal and written material supporting the discussion of sodium use in heart healthy nutrition. Review and explanation with models, verbal and written materials for utilization of the food label.   Exercise Physiology & Risk Factors: - Group verbal and written instruction with models to review the exercise physiology of the cardiovascular system and associated critical values. Details cardiovascular disease risk factors and the goals associated with each risk factor.          Cardiac Rehab from 12/06/2015 in [facility] Cardiac and Pulmonary Rehab   Date  12/06/15   Educator  Dorisann Frames   Instruction Review Code  2- meets goals/outcomes      Aerobic Exercise & Resistance Training: - Gives group verbal and written discussion on the health impact of inactivity. On the components of aerobic and resistive training programs and the benefits of this training and how to safely progress through these programs.   Flexibility, Balance, General Exercise Guidelines: - Provides group verbal and written instruction on the benefits of flexibility and balance training programs. Provides general exercise guidelines with specific guidelines to those with heart or lung disease. Demonstration and skill practice provided.   Stress Management: - Provides group verbal and written instruction about the health risks of elevated stress, cause of high stress, and healthy ways to reduce stress.   Depression: - Provides group verbal and written instruction on the correlation between heart/lung disease and depressed mood, treatment options, and the stigmas  associated with seeking treatment.   Anatomy & Physiology of the Heart: - Group verbal and written instruction and models provide basic cardiac anatomy and physiology, with the coronary electrical and arterial systems. Review of: AMI, Angina, Valve disease, Heart Failure, Cardiac Arrhythmia, Pacemakers, and the ICD.   Cardiac Procedures: - Group verbal and written instruction and models to describe the testing methods done to diagnose heart disease. Reviews the outcomes of the test results. Describes the treatment choices: Medical Management, Angioplasty, or Coronary Bypass Surgery.   Cardiac Medications: - Group verbal and written instruction to review commonly prescribed medications for heart disease. Reviews the medication, class of the drug, and side effects. Includes the steps to properly store meds and maintain the prescription regimen.   Go Sex-Intimacy & Heart Disease, Get SMART - Goal Setting: - Group verbal and written instruction through game format to discuss heart disease and the return to  sexual intimacy. Provides group verbal and written material to discuss and apply goal setting through the application of the S.M.A.R.T. Method.   Other Matters of the Heart: - Provides group verbal, written materials and models to describe Heart Failure, Angina, Valve Disease, and Diabetes in the realm of heart disease. Includes description of the disease process and treatment options available to the cardiac patient.   Exercise & Equipment Safety: - Individual verbal instruction and demonstration of equipment use and safety with use of the equipment.      Cardiac Rehab from 12/06/2015 in [facility] Cardiac and Pulmonary Rehab   Date  11/29/15   Educator  DW   Instruction Review Code  1- partially meets, needs review/practice      Infection Prevention: - Provides verbal and written material to individual with discussion of infection control including proper hand washing and proper equipment  cleaning during exercise session.      Cardiac Rehab from 12/06/2015 in [facility] Cardiac and Pulmonary Rehab   Date  11/29/15   Educator  DW   Instruction Review Code  2- meets goals/outcomes      Falls Prevention: - Provides verbal and written material to individual with discussion of falls prevention and safety.      Cardiac Rehab from 12/07/2015 in [facility] Cardiac and Pulmonary Rehab   Date  12/07/15   Educator  C. EnterkinRN   Instruction Review Code  2- meets goals/outcomes [Discussed when blood pressure was 70/40 to drink water first]      Diabetes: - Individual verbal and written instruction to review signs/symptoms of diabetes, desired ranges of glucose level fasting, after meals and with exercise. Advice that pre and post exercise glucose checks will be done for 3 sessions at entry of program.    Knowledge Questionnaire Score:     Knowledge Questionnaire Score - 11/29/15 1826    Knowledge Questionnaire Score   Pre Score 27//28      Personal Goals and Risk Factors at Admission:     Personal Goals and Risk Factors at Admission - 11/29/15 1836    Personal Goals and Risk Factors on Admission   Personal Goal Patient would like to be able to make her on bed and vacumn again.     Intervention Other  Start Cardiac Rehab and follow exercise prescription.  Patient's 8 week lifting restriction still in effect at this time.        Personal Goals and Risk Factors Review:      Goals and Risk Factor Review      12/07/15 1721           Increase Aerobic Exercise and Physical Activity   Goals Progress/Improvement seen  Yes       Comments Nolana said "I will be here every time I am suppose to but I have never liked to exercise. I suggested for her to bring her Kindle to read a book while exercising on the Nustep.        Abnormal Lipids   Goal --  Mayra will follow up with her MD about her cholesterol. still taking statin.          Personal Goals Discharge (Final Personal Goals  and Risk Factors Review):      Goals and Risk Factor Review - 12/07/15 1721    Increase Aerobic Exercise and Physical Activity   Goals Progress/Improvement seen  Yes   Comments Fiore said "I will be here every time I am suppose to but I have never  liked to exercise. I suggested for her to bring her Kindle to read a book while exercising on the Nustep.    Abnormal Lipids   Goal --  Jayla will follow up with her MD about her cholesterol. still taking statin.      ITP Comments:     ITP Comments      12/03/15 1406           ITP Comments Ready for 30 day review.  Continue with ITP   New to program, has attended orientation will start sessions soon.          Comments: Limited right shoulder movement still- not suppose to lift more than a gallon of milk. Hx of 2 chest tubes on the right side. [place] said she has a doctors' appt on Monday and will discuss that with him.

## 2015-12-07 NOTE — Progress Notes (Signed)
Daily Session Note  Patient Details  Name: [name] [name] MRN: [id number] Date of Birth: 1941/06/19 Referring Provider:  Thayer Headings, MD  Encounter Date: 12/07/2015  Check In:     Session Check In - 12/07/15 1614    Check-In   Staff Present Lestine Box, BS, ACSM EP-C, Exercise Physiologist;Mainor Hellmann, RN, BSN;Diane Joya Gaskins, RN, BSN   ER physicians immediately available to respond to emergencies See telemetry face sheet for immediately available ER MD   Medication changes reported     No   Fall or balance concerns reported    No   Warm-up and Cool-down Performed on first and last piece of equipment   VAD Patient? No   Pain Assessment   Currently in Pain? No/denies         Goals Met:  Proper associated with RPD/PD & O2 Sat Exercise tolerated well No report of cardiac concerns or symptoms  Goals Unmet:  Not Applicable  Goals Comments: [name] is doing well today on Recumbent Elliptical [id number]   Dr. Emily Filbert is Medical Director for [place] and [facility].

## 2015-12-11 ENCOUNTER — Encounter: Payer: Self-pay | Admitting: Thoracic Surgery (Cardiothoracic Vascular Surgery)

## 2015-12-11 ENCOUNTER — Ambulatory Visit (INDEPENDENT_AMBULATORY_CARE_PROVIDER_SITE_OTHER): Payer: Self-pay | Admitting: Thoracic Surgery (Cardiothoracic Vascular Surgery)

## 2015-12-11 VITALS — BP 99/66 | HR 89 | Resp 16 | Ht 66.0 in | Wt 136.5 lb

## 2015-12-11 DIAGNOSIS — Z9889 Other specified postprocedural states: Secondary | ICD-10-CM

## 2015-12-11 DIAGNOSIS — I340 Nonrheumatic mitral (valve) insufficiency: Secondary | ICD-10-CM

## 2015-12-11 DIAGNOSIS — I481 Persistent atrial fibrillation: Secondary | ICD-10-CM

## 2015-12-11 DIAGNOSIS — I4819 Other persistent atrial fibrillation: Secondary | ICD-10-CM

## 2015-12-11 DIAGNOSIS — Z8679 Personal history of other diseases of the circulatory system: Secondary | ICD-10-CM

## 2015-12-11 DIAGNOSIS — Z953 Presence of xenogenic heart valve: Secondary | ICD-10-CM

## 2015-12-11 DIAGNOSIS — I341 Nonrheumatic mitral (valve) prolapse: Secondary | ICD-10-CM

## 2015-12-11 NOTE — Progress Notes (Signed)
[address]       [name] [name],[name] [name] [address]             [phone]     CARDIOTHORACIC SURGERY OFFICE NOTE  Referring Provider is Nahser, Wonda Cheng, MD PCP is [name], MD   HPI:  Patient returns to the office today for routine follow-up status post minimally invasive mitral valve replacement using a bioprosthetic tissue valve with Maze procedure on 10/31/2015 for severe symptomatic primary mitral regurgitation with recurrent persistent atrial fibrillation. Her postoperative recovery was notable for the presence of severe bradycardia which persisted, ultimately requiring permanent pacemaker placement by Dr. Lovena Le on 11/06/2015. She was discharged from hospital on 11/09/2015 and last seen here in follow-up in our office on 11/13/2015. Since then she has been seen in follow-up at Dr. Elmarie Shiley office on 11/23/2015. She returns to our office for routine follow-up today.  Patient states she is doing very well. She started the cardiac rehabilitation program and has begun making significant progress. She has mild residual soreness in her right chest. This does not seem to limit her much at all and she is not taking any sort of pain relievers. She denies any shortness of breath. She states that her exercise tolerance is not back to being as good as it was a year ago, but he is already much better than it was immediately prior to surgery. She has not had any problems with fluid overload and she has not been taking Lasix. Her weight has been stable. She has not had any palpitations or dizzy spells. Her Coumadin dose has stabilized. Her most recent INR was reportedly 2.2.  The remainder of her review of systems is unremarkable.   Current Outpatient Prescriptions  Medication Sig Dispense Refill  . albuterol (PROVENTIL HFA;VENTOLIN HFA) 108 (90 BASE) MCG/ACT inhaler Inhale 2 puffs into the lungs every 6 (six) hours as needed for wheezing or shortness of breath. 1 Inhaler 2  .  ALPRAZolam (XANAX) 0.25 MG tablet Take 1 tablet (0.25 mg total) by mouth at bedtime as needed for anxiety or sleep. 30 tablet 5  . amiodarone (PACERONE) 200 MG tablet Take 1 tablet (200 mg total) by mouth daily. 30 tablet 0  . amoxicillin (AMOXIL) 500 MG tablet Take 4 tablets (2,000 mg total) by mouth once. Take 4 tablets 60 minutes prior to dental work 30 tablet 1  . azelastine (ASTELIN) 0.1 % nasal spray Place 1 spray into both nostrils daily.  5  . butalbital-acetaminophen-caffeine (FIORICET, ESGIC) 50-325-40 MG per tablet TAKE 1 TABLET BY MOUTH AS NEEDED FOR BACK PAIN OR HEADACHE 90 tablet 0  . furosemide (LASIX) 40 MG tablet Take 40 mg by mouth daily as needed for fluid.    . metoprolol tartrate (LOPRESSOR) 25 MG tablet Take 0.5 tablets (12.5 mg total) by mouth 2 (two) times daily. 30 tablet 1  . montelukast (SINGULAIR) 10 MG tablet Take 10 mg by mouth daily.     . rosuvastatin (CRESTOR) 5 MG tablet Take 1 tablet (5 mg total) by mouth every other day. 45 tablet 3  . traMADol (ULTRAM) 50 MG tablet Take 1-2 tablets (50-100 mg total) by mouth every 4 (four) hours as needed for moderate pain. 30 tablet 0  . warfarin (COUMADIN) 2 MG tablet Take 1 tablet (2 mg total) by mouth as directed. 30 tablet 3   No current facility-administered medications for this visit.      Physical Exam:   BP 99/66 mmHg  Pulse 89  Resp 16  Ht 5\' 6"  (1.676 m)  Wt 136 lb 8 oz (61.916 kg)  BMI 22.04 kg/m2  SpO2 98%  LMP  (Exact Date)  General:  Well-appearing  Chest:   Clear to auscultation  CV:   Regular rate and rhythm without murmur  Incisions:  Completely healed  Abdomen:  Soft and nontender  Extremities:  Warm and well-perfused  Diagnostic Tests:  CHEST 2 VIEW  COMPARISON: 11/13/2015  FINDINGS: There is a left chest wall pacer device with lead in the right atrial appendage and right ventricle. Cardiac enlargement. Left atrial appendage clip is identified. Mitral valve replacement is noted.  There is cardiac enlargement. There is a small right pleural effusion, improved from previous exam. Resolution of previous left pleural effusion. Improved aeration of both lower lobes.  IMPRESSION: 1. No acute findings. 2. Improved aeration of both lower lobes. 3. Resolution of left effusion and decrease in volume of right effusion.   Electronically Signed  By: Kerby Moors M.D.  On: 11/23/2015 16:00    Impression:  Patient is doing very well just over one month following minimally invasive mitral valve replacement using a bioprosthetic tissue valve with Maze procedure. She is maintaining stable AV paced rhythm and clinically doing very well.  Plan:  I have encouraged the patient to continue to increase her activity as tolerated without any particular limitations at this time. She has been instructed to stop taking amiodarone 1 her current prescription runs out. She will notify the Coumadin clinic when she stops taking amiodarone. All of her questions have been addressed. She will return for routine follow-up and rhythm check in 2 months.   Valentina Gu. Roxy Manns, MD 12/11/2015 11:28 AM

## 2015-12-11 NOTE — Patient Instructions (Signed)
You may resume unrestricted physical activity without any particular limitations at this time.  Stop amiodarone when your current prescription runs out.

## 2015-12-13 ENCOUNTER — Encounter: Payer: Medicare Other | Admitting: *Deleted

## 2015-12-13 ENCOUNTER — Ambulatory Visit: Payer: Medicare Other | Admitting: Pulmonary Disease

## 2015-12-13 DIAGNOSIS — Z950 Presence of cardiac pacemaker: Secondary | ICD-10-CM

## 2015-12-13 DIAGNOSIS — Z9889 Other specified postprocedural states: Secondary | ICD-10-CM | POA: Diagnosis not present

## 2015-12-13 DIAGNOSIS — Z8679 Personal history of other diseases of the circulatory system: Secondary | ICD-10-CM

## 2015-12-13 NOTE — Progress Notes (Signed)
Daily Session Note  Patient Details  Name: [name] [name] MRN: [id number] Date of Birth: Jun 05, 1941 Referring Provider:  [name], MD  Encounter Date: 12/13/2015  Check In:     Session Check In - 12/13/15 1621    Check-In   Staff Present Gerlene Burdock, RN, Drusilla Kanner, MS, ACSM CEP, Exercise Physiologist;Diane Joya Gaskins, RN, BSN   ER physicians immediately available to respond to emergencies See telemetry face sheet for immediately available ER MD   Medication changes reported     No   Fall or balance concerns reported    No   Warm-up and Cool-down Performed on first and last piece of equipment   VAD Patient? No   Pain Assessment   Currently in Pain? No/denies   Multiple Pain Sites No         Goals Met:  Exercise tolerated well No report of cardiac concerns or symptoms Strength training completed today  Goals Unmet:  Not Applicable  Goals Comments:  Patient completed exercise prescription and all exercise goals during rehab session. The exercise was tolerated well and the patient is progressing in the program.    Dr. Emily Filbert is Medical Director for [place] and [facility].

## 2015-12-14 ENCOUNTER — Encounter: Payer: Medicare Other | Admitting: *Deleted

## 2015-12-14 DIAGNOSIS — Z9889 Other specified postprocedural states: Secondary | ICD-10-CM | POA: Diagnosis not present

## 2015-12-14 NOTE — Progress Notes (Signed)
Daily Session Note  Patient Details  Name: MARISUE CANION MRN: [id number] Date of Birth: 09/21/1941 Referring Provider:  Thayer Headings, MD  Encounter Date: 12/14/2015  Check In:     Session Check In - 12/14/15 1645    Check-In   Staff Present Gerlene Burdock, RN, BSN;Diane Joya Gaskins, RN, BSN;Steven Way, BS, ACSM EP-C, Exercise Physiologist   ER physicians immediately available to respond to emergencies See telemetry face sheet for immediately available ER MD   Medication changes reported     No   Fall or balance concerns reported    No   Warm-up and Cool-down Performed on first and last piece of equipment   VAD Patient? No   Pain Assessment   Currently in Pain? No/denies         Goals Met:  Proper associated with RPD/PD & O2 Sat Exercise tolerated well  Goals Unmet:  Not Applicable  Goals Comments:    Dr. Emily Filbert is Medical Director for [place] and [facility].

## 2015-12-18 DIAGNOSIS — Z9889 Other specified postprocedural states: Secondary | ICD-10-CM

## 2015-12-18 DIAGNOSIS — Z950 Presence of cardiac pacemaker: Secondary | ICD-10-CM

## 2015-12-18 DIAGNOSIS — Z8679 Personal history of other diseases of the circulatory system: Secondary | ICD-10-CM

## 2015-12-18 NOTE — Progress Notes (Signed)
Daily Session Note  Patient Details  Name: [name] [name] MRN: [id number] Date of Birth: 02/29/1941 Referring Provider:  Tullo, Teresa L, MD  Encounter Date: 12/18/2015  Check In:     Session Check In - 12/18/15 1614    Check-In   Staff Present Susanne Bice, RN, BSN, CCRP;Renee MacMillan, MS, ACSM CEP, Exercise Physiologist; , BS, ACSM EP-C, Exercise Physiologist   ER physicians immediately available to respond to emergencies See telemetry face sheet for immediately available ER MD   Medication changes reported     No   Fall or balance concerns reported    No   Warm-up and Cool-down Performed on first and last piece of equipment   VAD Patient? No   Pain Assessment   Currently in Pain? No/denies         Goals Met:  Proper associated with RPD/PD & O2 Sat Exercise tolerated well No report of cardiac concerns or symptoms Strength training completed today  Goals Unmet:  Not Applicable  Goals Comments:   Dr. Mark Miller is Medical Director for [facility] and [facility]. 

## 2015-12-20 ENCOUNTER — Ambulatory Visit (INDEPENDENT_AMBULATORY_CARE_PROVIDER_SITE_OTHER): Payer: Medicare Other | Admitting: *Deleted

## 2015-12-20 ENCOUNTER — Encounter: Payer: Medicare Other | Admitting: *Deleted

## 2015-12-20 DIAGNOSIS — E785 Hyperlipidemia, unspecified: Secondary | ICD-10-CM

## 2015-12-20 DIAGNOSIS — Z953 Presence of xenogenic heart valve: Secondary | ICD-10-CM | POA: Diagnosis not present

## 2015-12-20 DIAGNOSIS — Z9889 Other specified postprocedural states: Secondary | ICD-10-CM

## 2015-12-20 DIAGNOSIS — Z950 Presence of cardiac pacemaker: Secondary | ICD-10-CM

## 2015-12-20 DIAGNOSIS — Z8679 Personal history of other diseases of the circulatory system: Secondary | ICD-10-CM

## 2015-12-20 LAB — POCT INR: INR: 1.8

## 2015-12-20 NOTE — Progress Notes (Signed)
Cardiac Individual Treatment Plan  Patient Details  Name: [name] [name] MRN: [id number] Date of Birth: 1941/11/24 Referring Provider:  Thayer Headings, MD  Initial Encounter Date:    Visit Diagnosis: S/P placement of cardiac pacemaker  S/P mitral valve repair  Patient's Home Medications on Admission:  Current outpatient prescriptions:  .  albuterol (PROVENTIL HFA;VENTOLIN HFA) 108 (90 BASE) MCG/ACT inhaler, Inhale 2 puffs into the lungs every 6 (six) hours as needed for wheezing or shortness of breath., Disp: 1 Inhaler, Rfl: 2 .  ALPRAZolam (XANAX) 0.25 MG tablet, Take 1 tablet (0.25 mg total) by mouth at bedtime as needed for anxiety or sleep., Disp: 30 tablet, Rfl: 5 .  amoxicillin (AMOXIL) 500 MG tablet, Take 4 tablets (2,000 mg total) by mouth once. Take 4 tablets 60 minutes prior to dental work, Disp: 30 tablet, Rfl: 1 .  azelastine (ASTELIN) 0.1 % nasal spray, Place 1 spray into both nostrils daily., Disp: , Rfl: 5 .  butalbital-acetaminophen-caffeine (FIORICET, ESGIC) 50-325-40 MG per tablet, TAKE 1 TABLET BY MOUTH AS NEEDED FOR BACK PAIN OR HEADACHE, Disp: 90 tablet, Rfl: 0 .  furosemide (LASIX) 40 MG tablet, Take 40 mg by mouth daily as needed for fluid., Disp: , Rfl:  .  metoprolol tartrate (LOPRESSOR) 25 MG tablet, Take 0.5 tablets (12.5 mg total) by mouth 2 (two) times daily., Disp: 30 tablet, Rfl: 1 .  montelukast (SINGULAIR) 10 MG tablet, Take 10 mg by mouth daily. , Disp: , Rfl:  .  rosuvastatin (CRESTOR) 5 MG tablet, Take 1 tablet (5 mg total) by mouth every other day., Disp: 45 tablet, Rfl: 3 .  traMADol (ULTRAM) 50 MG tablet, Take 1-2 tablets (50-100 mg total) by mouth every 4 (four) hours as needed for moderate pain., Disp: 30 tablet, Rfl: 0 .  warfarin (COUMADIN) 2 MG tablet, Take 1 tablet (2 mg total) by mouth as directed., Disp: 30 tablet, Rfl: 3  Past Medical History: Past Medical History  Diagnosis Date  . SVT (supraventricular tachycardia) ([place])   . MVP  (mitral valve prolapse)   . Hyperlipidemia   . Idiopathic thrombocytopenic purpura (ITP)   . Hemorrhoid   . IBS (irritable bowel syndrome)   . Migraine   . Severe mitral regurgitation   . RBBB   . Anxiety associated with depression     Prn alprazolam   . Thoracic aorta atherosclerosis ([place])   . Nodule of right lung   . Restrictive lung disease     Mild on PFT & likely cardiac in etiology   . 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 Atrial fibrillation, persistent ([place])     DCCV 08/22/2015  . Acute on chronic diastolic (congestive) heart failure ([place])   . 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 S/P Minimally invasive maze operation for atrial fibrillation 10/31/2015    Complete bilateral atrial lesion set using cryothermy and bipolar radiofrequency ablation with clipping of LA appendage via right mini thoracotomy approach  . Bradycardia post-op bradycardia, pacer dependent    MDT PPM 11/06/15, Dr. Lovena Le    Tobacco Use: History  Smoking status  . Never Smoker   Smokeless tobacco  . Never Used    Labs: Recent Review Flowsheet Data    Labs for ITP Cardiac and Pulmonary Rehab Latest Ref Rng 11/01/2015 11/01/2015 11/01/2015 11/01/2015 11/01/2015   PHART 7.350 - 7.450 7.391  7.391 - 7.226(L) -   PCO2ART 35.0 - 45.0 mmHg 38.8 37.9 - 59.5(HH) -   HCO3 20.0 - 24.0 mEq/L 23.6 23.1 - 24.7(H) -   TCO2 0 - 100 mmol/L '25 24 25 26 22   ' ACIDBASEDEF 0.0 - 2.0 mmol/L 1.0 2.0 - 4.0(H) -   O2SAT - 97.0 95.0 - 100.0 -       Exercise Target Goals:    Exercise Program Goal: Individual exercise prescription set with THRR, safety & activity barriers. Participant demonstrates ability to understand and report RPE using BORG scale, to self-measure pulse accurately, and to acknowledge the importance of the exercise prescription.  Exercise Prescription  Goal: Starting with aerobic activity 30 plus minutes a day, 3 days per week for initial exercise prescription. Provide home exercise prescription and guidelines that participant acknowledges understanding prior to discharge.  Activity Barriers & Risk Stratification:     Activity Barriers & Risk Stratification - 11/29/15 1849    Activity Barriers & Risk Stratification   Activity Barriers Incisional Pain;History of Falls;Arthritis;Back Problems;Other (comment)   Comments Incisional pain on right chest from MVR/s/p two chest tubes.  Patient is on an eight week lifting restriction of right arm through January 24th, 2017.        6 Minute Walk:     6 Minute Walk      11/29/15 1618       6 Minute Walk   Phase Initial     Distance 1275 feet     Walk Time 6 minutes     Resting HR 88 bpm     Resting BP 118/74 mmHg     Max Ex. HR 113 bpm     Max Ex. BP 126/62 mmHg     RPE 13     Symptoms No        Initial Exercise Prescription:     Initial Exercise Prescription - 11/29/15 1600    Date of Initial Exercise Prescription   Date 11/29/15   Treadmill   MPH 2   Grade 0   Minutes 10   Bike   Level 0.4   Minutes 10   Recumbant Bike   Level 3   RPM 40   Watts 25   Minutes 10   NuStep   Level 2   Watts 30   Minutes 15   Arm Ergometer   Level 1   Watts 8   Minutes 10   Arm/Foot Ergometer   Level 4   Watts 12   Minutes 10   Cybex   Level 1   RPM 50   Minutes 10   Recumbant Elliptical   Level 1   RPM 40   Watts 10   Minutes 10   Elliptical   Level 1   Speed 3   Minutes 1   REL-XR   Level 3   Watts 35   Minutes 15   T5 Nustep   Level 2   Watts 20   Minutes 15   Biostep-RELP   Level 3   Watts 20   Minutes 15   Prescription Details   Frequency (times per week) 3   Duration Progress to 30 minutes of continuous aerobic without signs/symptoms of physical distress   Intensity   THRR REST +  30   Ratings of Perceived Exertion 11-15   Progression Continue  progressive overload as per policy without signs/symptoms or physical distress.   Resistance Training   Training Prescription Yes   Weight 2  Reps 10-15      Exercise Prescription Changes:     Exercise Prescription Changes      12/18/15 0636           Exercise Review   Progression Yes       Response to Exercise   Blood Pressure (Admit) 112/60 mmHg       Blood Pressure (Exercise) 132/64 mmHg       Blood Pressure (Exit) 94/56 mmHg       Heart Rate (Admit) 94 bpm       Heart Rate (Exercise) 98 bpm       Heart Rate (Exit) 78 bpm       Rating of Perceived Exertion (Exercise) 13       Symptoms None       Comments Reviewed individualized exercise prescription and made increases per departmental policy. Exercise increases were discussed with the patient and they were able to perform the new work loads without issue (no signs or symptoms).        Duration Progress to 30 minutes of continuous aerobic without signs/symptoms of physical distress       Intensity Rest + 30       Progression Continue progressive overload as per policy without signs/symptoms or physical distress.       Resistance Training   Training Prescription Yes       Weight 2       Reps 10-15       Interval Training   Interval Training No       Treadmill   MPH 2.7       Grade 0       Minutes 20       REL-XR   Level 4       Watts 45       Minutes 15          Discharge Exercise Prescription (Final Exercise Prescription Changes):     Exercise Prescription Changes - 12/18/15 0636    Exercise Review   Progression Yes   Response to Exercise   Blood Pressure (Admit) 112/60 mmHg   Blood Pressure (Exercise) 132/64 mmHg   Blood Pressure (Exit) 94/56 mmHg   Heart Rate (Admit) 94 bpm   Heart Rate (Exercise) 98 bpm   Heart Rate (Exit) 78 bpm   Rating of Perceived Exertion (Exercise) 13   Symptoms None   Comments Reviewed individualized exercise prescription and made increases per departmental policy. Exercise  increases were discussed with the patient and they were able to perform the new work loads without issue (no signs or symptoms).    Duration Progress to 30 minutes of continuous aerobic without signs/symptoms of physical distress   Intensity Rest + 30   Progression Continue progressive overload as per policy without signs/symptoms or physical distress.   Resistance Training   Training Prescription Yes   Weight 2   Reps 10-15   Interval Training   Interval Training No   Treadmill   MPH 2.7   Grade 0   Minutes 20   REL-XR   Level 4   Watts 45   Minutes 15      Nutrition:  Target Goals: Understanding of nutrition guidelines, daily intake of sodium <1524m, cholesterol <2037m calories 30% from fat and 7% or less from saturated fats, daily to have 5 or more servings of fruits and vegetables.  Biometrics:     Pre Biometrics - 11/29/15 1617    Pre Biometrics   Height 5'  5" (1.651 m)   Weight 140 lb 6.4 oz (63.685 kg)   Waist Circumference 32 inches   Hip Circumference 39.5 inches   Waist to Hip Ratio 0.81 %   BMI (Calculated) 23.4       Nutrition Therapy Plan and Nutrition Goals:   Nutrition Discharge: Rate Your Plate Scores:   Nutrition Goals Re-Evaluation:     Nutrition Goals Re-Evaluation      12/07/15 1720           Personal Goal #1 Re-Evaluation   Personal Goal #1 Izora Gala said she has cut out salt so she has not needed to really take her prn Lasix except for about 2 times.        Goal Progress Seen Yes          Psychosocial: Target Goals: Acknowledge presence or absence of depression, maximize coping skills, provide positive support system. Participant is able to verbalize types and ability to use techniques and skills needed for reducing stress and depression.  Initial Review & Psychosocial Screening:     Initial Psych Review & Screening - 11/29/15 [place]? Yes   Comments Pt has good support system, as she has  one adult son and adult daughter and 4 grand children.     Barriers   Psychosocial barriers to participate in program There are no identifiable barriers or psychosocial needs.   Screening Interventions   Interventions Encouraged to exercise;Program counselor consult      Quality of Life Scores:     Quality of Life - 11/30/15 1143    Quality of Life Scores   Health/Function Pre 19.73 %   Socioeconomic Pre 30 %   Psych/Spiritual Pre 23.79 %   Family Pre 27 %   GLOBAL Pre 23.7 %      PHQ-9:     Recent Review Flowsheet Data    Depression screen [facility] 2/9 11/29/2015 02/02/2015   Decreased Interest 0 0   Down, Depressed, Hopeless 0 0   PHQ - 2 Score 0 0   Altered sleeping 1 -   Tired, decreased energy 1 -   Change in appetite 0 -   Feeling bad or failure about yourself  0 -   Trouble concentrating 0 -   Moving slowly or fidgety/restless 0 -   Suicidal thoughts 0 -   PHQ-9 Score 2 -   Difficult doing work/chores Not difficult at all -      Psychosocial Evaluation and Intervention:     Psychosocial Evaluation - 12/13/15 1658    Psychosocial Evaluation & Interventions   Comments Counselor met with Ms. Lemus (Ms. J) for initial psychosocial evaluation.  She is a 75 year old well-adjusted individual who had mitral valve replacement and a pacemaker over the past few months.  She is a widow who lives alone, but has family and friends close by as well as active involvement in her local church community.  Ms. Lenna Sciara states that she is sleeping better lately and her appetite is good.  She reports a history of anxiety with some recent symptoms.  She has been prescribed medication to take as needed for anxiety symptoms.  She reports a typically positive mood and has goals to return to her normal independent activities in the near future.  She states the most stressful thing for her currently is having to ask others for help.  Counselor will continue to follow with Ms. Lenna Sciara over the course of  this  program.     Continued Psychosocial Services Needed --  Counselor presented psychoeducation today on Stress management and Ms. J was an active participant in this class in order to learn and begin to practice new coping skills in her life to combat stress.        Psychosocial Re-Evaluation:     Psychosocial Re-Evaluation      12/07/15 1723 12/20/15 1619         Psychosocial Re-Evaluation   Interventions Encouraged to attend Cardiac Rehabilitation for the exercise       Comments Alejandra said "I will be here every time I am suppose to but I have never liked to exercise. I suggested for her to bring her Kindle to read a book while exercising on the Nustep. I suggested a book on tape but Dusty said she doesn't like anything in her ear. She will try her Kindle to pass time in Cardiac rehab. Pedro said she was stressed today when she came to Cardiac Rehab the revolving door got stuck but the Valet guys fixed it.          Vocational Rehabilitation: Provide vocational rehab assistance to qualifying candidates.   Vocational Rehab Evaluation & Intervention:     Vocational Rehab - 11/29/15 1819    Initial Vocational Rehab Evaluation & Intervention   Assessment shows need for Vocational Rehabilitation No      Education: Education Goals: Education classes will be provided on a weekly basis, covering required topics. Participant will state understanding/return demonstration of topics presented.  Learning Barriers/Preferences:     Learning Barriers/Preferences - 11/29/15 1816    Learning Barriers/Preferences   Learning Barriers None   Learning Preferences None      Education Topics: General Nutrition Guidelines/Fats and Fiber: -Group instruction provided by verbal, written material, models and posters to present the general guidelines for heart healthy nutrition. Gives an explanation and review of dietary fats and fiber.   Controlling Sodium/Reading Food Labels: -Group verbal and  written material supporting the discussion of sodium use in heart healthy nutrition. Review and explanation with models, verbal and written materials for utilization of the food label.   Exercise Physiology & Risk Factors: - Group verbal and written instruction with models to review the exercise physiology of the cardiovascular system and associated critical values. Details cardiovascular disease risk factors and the goals associated with each risk factor.          Cardiac Rehab from 12/18/2015 in [facility] Cardiac and Pulmonary Rehab   Date  12/06/15   Educator  Dorisann Frames   Instruction Review Code  2- meets goals/outcomes      Aerobic Exercise & Resistance Training: - Gives group verbal and written discussion on the health impact of inactivity. On the components of aerobic and resistive training programs and the benefits of this training and how to safely progress through these programs.      Cardiac Rehab from 12/18/2015 in [facility] Cardiac and Pulmonary Rehab   Date  12/18/15   Educator  RM   Instruction Review Code  2- meets goals/outcomes      Flexibility, Balance, General Exercise Guidelines: - Provides group verbal and written instruction on the benefits of flexibility and balance training programs. Provides general exercise guidelines with specific guidelines to those with heart or lung disease. Demonstration and skill practice provided.      Cardiac Rehab from 12/18/2015 in [facility] Cardiac and Pulmonary Rehab   Date  12/18/15   Educator  RM  Instruction Review Code  2- meets goals/outcomes      Stress Management: - Provides group verbal and written instruction about the health risks of elevated stress, cause of high stress, and healthy ways to reduce stress.      Cardiac Rehab from 12/18/2015 in [facility] Cardiac and Pulmonary Rehab   Date  12/13/15   Educator  Kathreen Cornfield   Instruction Review Code  2- meets goals/outcomes      Depression: - Provides group verbal and written  instruction on the correlation between heart/lung disease and depressed mood, treatment options, and the stigmas associated with seeking treatment.   Anatomy & Physiology of the Heart: - Group verbal and written instruction and models provide basic cardiac anatomy and physiology, with the coronary electrical and arterial systems. Review of: AMI, Angina, Valve disease, Heart Failure, Cardiac Arrhythmia, Pacemakers, and the ICD.   Cardiac Procedures: - Group verbal and written instruction and models to describe the testing methods done to diagnose heart disease. Reviews the outcomes of the test results. Describes the treatment choices: Medical Management, Angioplasty, or Coronary Bypass Surgery.   Cardiac Medications: - Group verbal and written instruction to review commonly prescribed medications for heart disease. Reviews the medication, class of the drug, and side effects. Includes the steps to properly store meds and maintain the prescription regimen.   Go Sex-Intimacy & Heart Disease, Get SMART - Goal Setting: - Group verbal and written instruction through game format to discuss heart disease and the return to sexual intimacy. Provides group verbal and written material to discuss and apply goal setting through the application of the S.M.A.R.T. Method.   Other Matters of the Heart: - Provides group verbal, written materials and models to describe Heart Failure, Angina, Valve Disease, and Diabetes in the realm of heart disease. Includes description of the disease process and treatment options available to the cardiac patient.   Exercise & Equipment Safety: - Individual verbal instruction and demonstration of equipment use and safety with use of the equipment.      Cardiac Rehab from 12/18/2015 in [facility] Cardiac and Pulmonary Rehab   Date  11/29/15   Educator  DW   Instruction Review Code  1- partially meets, needs review/practice      Infection Prevention: - Provides verbal and written  material to individual with discussion of infection control including proper hand washing and proper equipment cleaning during exercise session.      Cardiac Rehab from 12/18/2015 in [facility] Cardiac and Pulmonary Rehab   Date  11/29/15   Educator  DW   Instruction Review Code  2- meets goals/outcomes      Falls Prevention: - Provides verbal and written material to individual with discussion of falls prevention and safety.      Cardiac Rehab from 12/18/2015 in [facility] Cardiac and Pulmonary Rehab   Date  12/07/15   Educator  C. EnterkinRN   Instruction Review Code  2- meets goals/outcomes [Discussed when blood pressure was 70/40 to drink water first]      Diabetes: - Individual verbal and written instruction to review signs/symptoms of diabetes, desired ranges of glucose level fasting, after meals and with exercise. Advice that pre and post exercise glucose checks will be done for 3 sessions at entry of program.    Knowledge Questionnaire Score:     Knowledge Questionnaire Score - 11/29/15 1826    Knowledge Questionnaire Score   Pre Score 27//28      Personal Goals and Risk Factors at Admission:  Personal Goals and Risk Factors at Admission - 11/29/15 1836    Personal Goals and Risk Factors on Admission   Personal Goal Patient would like to be able to make her on bed and vacumn again.     Intervention Other  Start Cardiac Rehab and follow exercise prescription.  Patient's 8 week lifting restriction still in effect at this time.        Personal Goals and Risk Factors Review:      Goals and Risk Factor Review      12/07/15 1721 12/19/15 0639         Increase Aerobic Exercise and Physical Activity   Goals Progress/Improvement seen  Yes Yes      Comments Amerah said "I will be here every time I am suppose to but I have never liked to exercise. I suggested for her to bring her Kindle to read a book while exercising on the Nustep.  Greenleigh went shopping the other day and was able  to walk to all the stores she wanted to go to. She did start to feel some SOB at the last store and she said this indicated she still has some work to do with exercise to further build up her endurance. She is progressing well on all equipment and is very responsive to suggestions and challenges.       Abnormal Lipids   Goal --  Adabella will follow up with her MD about her cholesterol. still taking statin.          Personal Goals Discharge (Final Personal Goals and Risk Factors Review):      Goals and Risk Factor Review - 12/19/15 0639    Increase Aerobic Exercise and Physical Activity   Goals Progress/Improvement seen  Yes   Comments Hollin went shopping the other day and was able to walk to all the stores she wanted to go to. She did start to feel some SOB at the last store and she said this indicated she still has some work to do with exercise to further build up her endurance. She is progressing well on all equipment and is very responsive to suggestions and challenges.       ITP Comments:     ITP Comments      12/03/15 1406           ITP Comments Ready for 30 day review.  Continue with ITP   New to program, has attended orientation will start sessions soon.          Comments: Liora said she was stressed a little bit coming into [facility] since the main revolving door upstairs got stuck but the Valet man fixed it.

## 2015-12-20 NOTE — Progress Notes (Signed)
Daily Session Note  Patient Details  Name: [name] [name] MRN: [id number] Date of Birth: August 15, 1941 Referring Provider:  Thayer Headings, MD  Encounter Date: 12/20/2015  Check In:     Session Check In - 12/20/15 1619    Check-In   Staff Present Gerlene Burdock, RN, BSN;Diane Joya Gaskins, RN, Drusilla Kanner, [place], ACSM CEP, Exercise Physiologist   ER physicians immediately available to respond to emergencies See telemetry face sheet for immediately available ER MD   Medication changes reported     No   Fall or balance concerns reported    No   Warm-up and Cool-down Performed on first and last piece of equipment   VAD Patient? No   Pain Assessment   Currently in Pain? No/denies         Goals Met:  Proper associated with RPD/PD & O2 Sat Exercise tolerated well  Goals Unmet:  Not Applicable  Goals Comments:    Dr. Emily Filbert is Medical Director for [place] and [facility].

## 2015-12-21 DIAGNOSIS — Z9889 Other specified postprocedural states: Secondary | ICD-10-CM | POA: Diagnosis not present

## 2015-12-21 DIAGNOSIS — Z950 Presence of cardiac pacemaker: Secondary | ICD-10-CM

## 2015-12-21 NOTE — Progress Notes (Signed)
Daily Session Note  Patient Details  Name: [name] [name] MRN: [id number] Date of Birth: 1941/11/20 Referring Provider:  Thayer Headings, MD  Encounter Date: 12/21/2015  Check In:     Session Check In - 12/21/15 1554    Check-In   Staff Present Gerlene Burdock, RN, BSN;Adya Wirz, BS, ACSM EP-C, Exercise Physiologist;Diane Joya Gaskins, RN, BSN   ER physicians immediately available to respond to emergencies See telemetry face sheet for immediately available ER MD   Medication changes reported     No   Fall or balance concerns reported    No   Warm-up and Cool-down Performed on first and last piece of equipment   VAD Patient? No   Pain Assessment   Currently in Pain? No/denies         Goals Met:  Proper associated with RPD/PD & O2 Sat Exercise tolerated well No report of cardiac concerns or symptoms Strength training completed today  Goals Unmet:  Not Applicable  Goals Comments:    Dr. Emily Filbert is Medical Director for [facility] and [facility].

## 2015-12-22 NOTE — Anesthesia Postprocedure Evaluation (Signed)
Anesthesia Post Note  Patient: [name] [name]  Procedure(s) Performed: Procedure(s) (LRB): MINIMALLY INVASIVE MAZE PROCEDURE (N/A) TRANSESOPHAGEAL ECHOCARDIOGRAM (TEE) (N/A) MINIMALLY INVASIVE MITRAL VALVE (MV) REPLACEMENT (Right)  Patient location during evaluation: SICU Anesthesia Type: General Level of consciousness: patient remains intubated per anesthesia plan Pain management: pain level controlled Vital Signs Assessment: post-procedure vital signs reviewed and stable Respiratory status: patient on ventilator - see flowsheet for VS Anesthetic complications: no    Last Vitals:  Filed Vitals:   11/09/15 0552 11/09/15 0959  BP: 101/55 105/58  Pulse: 71 71  Temp: 36.5 C   Resp: 18     Last Pain:  Filed Vitals:   11/09/15 1001  PainSc: 0-No pain                 Teshawn Moan COKER

## 2015-12-25 DIAGNOSIS — Z8679 Personal history of other diseases of the circulatory system: Secondary | ICD-10-CM

## 2015-12-25 DIAGNOSIS — Z950 Presence of cardiac pacemaker: Secondary | ICD-10-CM

## 2015-12-25 DIAGNOSIS — Z9889 Other specified postprocedural states: Secondary | ICD-10-CM | POA: Diagnosis not present

## 2015-12-25 NOTE — Progress Notes (Signed)
Daily Session Note  Patient Details  Name: LIZBETT GARCIAGARCIA MRN: [id number] Date of Birth: 10/19/1941 Referring Provider:  Thayer Headings, MD  Encounter Date: 12/25/2015  Check In:     Session Check In - 12/25/15 1617    Check-In   Staff Present Heath Lark, RN, BSN, CCRP;Carroll Enterkin, RN, BSN;Gearldine Looney, BS, ACSM EP-C, Exercise Physiologist   ER physicians immediately available to respond to emergencies See telemetry face sheet for immediately available ER MD   Medication changes reported     No   Fall or balance concerns reported    No   Warm-up and Cool-down Performed on first and last piece of equipment   VAD Patient? No   Pain Assessment   Currently in Pain? No/denies         Goals Met:  Proper associated with RPD/PD & O2 Sat Exercise tolerated well No report of cardiac concerns or symptoms Strength training completed today  Goals Unmet:  Not Applicable  Goals Comments:   Dr. Emily Filbert is Medical Director for [facility].

## 2015-12-25 NOTE — Progress Notes (Signed)
Daily Session Note  Patient Details  Name: [name] [name] MRN: [id number] Date of Birth: 09-15-1941 Referring Provider:  Thayer Headings, MD  Encounter Date: 12/25/2015  Check In:     Session Check In - 12/25/15 1617    Check-In   Staff Present Heath Lark, RN, BSN, CCRP;Carroll Enterkin, RN, BSN;Steven Way, BS, ACSM EP-C, Exercise Physiologist   ER physicians immediately available to respond to emergencies See telemetry face sheet for immediately available ER MD   Medication changes reported     Yes   Fall or balance concerns reported    No   Warm-up and Cool-down Performed on first and last piece of equipment   VAD Patient? No   Pain Assessment   Currently in Pain? No/denies         Goals Met:  Exercise tolerated well No report of cardiac concerns or symptoms  Goals Unmet:  Not Applicable  Goals Comments: Doing well with exercise prescription progression.    Dr. Emily Filbert is Medical Director for [place] and [facility].

## 2015-12-27 ENCOUNTER — Encounter: Payer: Medicare Other | Admitting: *Deleted

## 2015-12-27 DIAGNOSIS — Z9889 Other specified postprocedural states: Secondary | ICD-10-CM | POA: Diagnosis not present

## 2015-12-27 DIAGNOSIS — Z950 Presence of cardiac pacemaker: Secondary | ICD-10-CM

## 2015-12-27 NOTE — Progress Notes (Signed)
Daily Session Note  Patient Details  Name: [name] [name] MRN: [id number] Date of Birth: [birth year] Referring Provider:  Thayer Headings, MD  Encounter Date: 12/27/2015  Check In:     Session Check In - 12/27/15 1722    Check-In   Staff Present Gerlene Burdock, RN, BSN;Susanne Bice, RN, BSN, CCRP;Diane Joya Gaskins, RN, BSN   ER physicians immediately available to respond to emergencies See telemetry face sheet for immediately available ER MD   Medication changes reported     No   Fall or balance concerns reported    No   Warm-up and Cool-down Performed on first and last piece of equipment   VAD Patient? No   Pain Assessment   Currently in Pain? No/denies   Multiple Pain Sites No         Goals Met:  Independence with exercise equipment Exercise tolerated well No report of cardiac concerns or symptoms Strength training completed today  Goals Unmet:  Not Applicable  Goals Comments:  Patient completed exercise prescription and all exercise goals during rehab session. The exercise was tolerated well and the patient is progressing in the program.   Dr. Emily Filbert is Medical Director for [facility].

## 2015-12-28 DIAGNOSIS — Z950 Presence of cardiac pacemaker: Secondary | ICD-10-CM

## 2015-12-28 DIAGNOSIS — Z9889 Other specified postprocedural states: Secondary | ICD-10-CM | POA: Diagnosis not present

## 2015-12-28 NOTE — Progress Notes (Signed)
Daily Session Note  Patient Details  Name: [name] MRN: [id number] Date of Birth: 02-28-1941 Referring Provider:  Thayer Headings, MD  Encounter Date: 12/28/2015  Check In:     Session Check In - 12/28/15 1604    Check-In   Staff Present Gerlene Burdock, RN, BSN;Diane Joya Gaskins, RN, BSN;Bevin Mayall, BS, ACSM EP-C, Exercise Physiologist   ER physicians immediately available to respond to emergencies See telemetry face sheet for immediately available ER MD   Medication changes reported     No   Fall or balance concerns reported    No   Warm-up and Cool-down Performed on first and last piece of equipment   VAD Patient? No   Pain Assessment   Currently in Pain? No/denies         Goals Met:  Proper associated with RPD/PD & O2 Sat Exercise tolerated well No report of cardiac concerns or symptoms Strength training completed today  Goals Unmet:  Not Applicable  Goals Comments:    Dr. Emily Filbert is Medical Director for [facility].

## 2015-12-29 NOTE — Progress Notes (Signed)
Cardiac Individual Treatment Plan  Patient Details  Name: [name] [name] MRN: [id number] Date of Birth: Sep 02, 1941 Referring Provider:  Thayer Headings, MD  Initial Encounter Date:    Visit Diagnosis: S/P mitral valve repair  S/P placement of cardiac pacemaker  Patient's Home Medications on Admission:  Current outpatient prescriptions:  .  albuterol (PROVENTIL HFA;VENTOLIN HFA) 108 (90 BASE) MCG/ACT inhaler, Inhale 2 puffs into the lungs every 6 (six) hours as needed for wheezing or shortness of breath., Disp: 1 Inhaler, Rfl: 2 .  ALPRAZolam (XANAX) 0.25 MG tablet, Take 1 tablet (0.25 mg total) by mouth at bedtime as needed for anxiety or sleep., Disp: 30 tablet, Rfl: 5 .  amoxicillin (AMOXIL) 500 MG tablet, Take 4 tablets (2,000 mg total) by mouth once. Take 4 tablets 60 minutes prior to dental work, Disp: 30 tablet, Rfl: 1 .  azelastine (ASTELIN) 0.1 % nasal spray, Place 1 spray into both nostrils daily., Disp: , Rfl: 5 .  butalbital-acetaminophen-caffeine (FIORICET, ESGIC) 50-325-40 MG per tablet, TAKE 1 TABLET BY MOUTH AS NEEDED FOR BACK PAIN OR HEADACHE, Disp: 90 tablet, Rfl: 0 .  furosemide (LASIX) 40 MG tablet, Take 40 mg by mouth daily as needed for fluid., Disp: , Rfl:  .  metoprolol tartrate (LOPRESSOR) 25 MG tablet, Take 0.5 tablets (12.5 mg total) by mouth 2 (two) times daily., Disp: 30 tablet, Rfl: 1 .  montelukast (SINGULAIR) 10 MG tablet, Take 10 mg by mouth daily. , Disp: , Rfl:  .  rosuvastatin (CRESTOR) 5 MG tablet, Take 1 tablet (5 mg total) by mouth every other day., Disp: 45 tablet, Rfl: 3 .  traMADol (ULTRAM) 50 MG tablet, Take 1-2 tablets (50-100 mg total) by mouth every 4 (four) hours as needed for moderate pain., Disp: 30 tablet, Rfl: 0 .  warfarin (COUMADIN) 2 MG tablet, Take 1 tablet (2 mg total) by mouth as directed., Disp: 30 tablet, Rfl: 3  Past Medical History: Past Medical History  Diagnosis Date  . SVT (supraventricular tachycardia) ([place])   . MVP  (mitral valve prolapse)   . Hyperlipidemia   . Idiopathic thrombocytopenic purpura (ITP)   . Hemorrhoid   . IBS (irritable bowel syndrome)   . Migraine   . Severe mitral regurgitation   . RBBB   . Anxiety associated with depression     Prn alprazolam   . Thoracic aorta atherosclerosis ([place])   . Nodule of right lung   . Restrictive lung disease     Mild on PFT & likely cardiac in etiology   . 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 Atrial fibrillation, persistent ([place])     DCCV 08/22/2015  . Acute on chronic diastolic (congestive) heart failure ([place])   . 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 S/P Minimally invasive maze operation for atrial fibrillation 10/31/2015    Complete bilateral atrial lesion set using cryothermy and bipolar radiofrequency ablation with clipping of LA appendage via right mini thoracotomy approach  . Bradycardia post-op bradycardia, pacer dependent    MDT PPM 11/06/15, Dr. Lovena Le    Tobacco Use: History  Smoking status  . Never Smoker   Smokeless tobacco  . Never Used    Labs: Recent Review Flowsheet Data    Labs for ITP Cardiac and Pulmonary Rehab Latest Ref Rng 11/01/2015 11/01/2015 11/01/2015 11/01/2015 11/01/2015   PHART 7.350 - 7.450 7.391  7.391 - 7.226(L) -   PCO2ART 35.0 - 45.0 mmHg 38.8 37.9 - 59.5(HH) -   HCO3 20.0 - 24.0 mEq/L 23.6 23.1 - 24.7(H) -   TCO2 0 - 100 mmol/L '25 24 25 26 22   ' ACIDBASEDEF 0.0 - 2.0 mmol/L 1.0 2.0 - 4.0(H) -   O2SAT - 97.0 95.0 - 100.0 -       Exercise Target Goals:    Exercise Program Goal: Individual exercise prescription set with THRR, safety & activity barriers. Participant demonstrates ability to understand and report RPE using BORG scale, to self-measure pulse accurately, and to acknowledge the importance of the exercise prescription.  Exercise Prescription  Goal: Starting with aerobic activity 30 plus minutes a day, 3 days per week for initial exercise prescription. Provide home exercise prescription and guidelines that participant acknowledges understanding prior to discharge.  Activity Barriers & Risk Stratification:     Activity Barriers & Risk Stratification - 11/29/15 1849    Activity Barriers & Risk Stratification   Activity Barriers Incisional Pain;History of Falls;Arthritis;Back Problems;Other (comment)   Comments Incisional pain on right chest from MVR/s/p two chest tubes.  Patient is on an eight week lifting restriction of right arm through January 24th, 2017.        6 Minute Walk:     6 Minute Walk      11/29/15 1618       6 Minute Walk   Phase Initial     Distance 1275 feet     Walk Time 6 minutes     Resting HR 88 bpm     Resting BP 118/74 mmHg     Max Ex. HR 113 bpm     Max Ex. BP 126/62 mmHg     RPE 13     Symptoms No        Initial Exercise Prescription:     Initial Exercise Prescription - 11/29/15 1600    Date of Initial Exercise Prescription   Date 11/29/15   Treadmill   MPH 2   Grade 0   Minutes 10   Bike   Level 0.4   Minutes 10   Recumbant Bike   Level 3   RPM 40   Watts 25   Minutes 10   NuStep   Level 2   Watts 30   Minutes 15   Arm Ergometer   Level 1   Watts 8   Minutes 10   Arm/Foot Ergometer   Level 4   Watts 12   Minutes 10   Cybex   Level 1   RPM 50   Minutes 10   Recumbant Elliptical   Level 1   RPM 40   Watts 10   Minutes 10   Elliptical   Level 1   Speed 3   Minutes 1   REL-XR   Level 3   Watts 35   Minutes 15   T5 Nustep   Level 2   Watts 20   Minutes 15   Biostep-RELP   Level 3   Watts 20   Minutes 15   Prescription Details   Frequency (times per week) 3   Duration Progress to 30 minutes of continuous aerobic without signs/symptoms of physical distress   Intensity   THRR REST +  30   Ratings of Perceived Exertion 11-15   Progression Continue  progressive overload as per policy without signs/symptoms or physical distress.   Resistance Training   Training Prescription Yes   Weight 2  Reps 10-15      Exercise Prescription Changes:     Exercise Prescription Changes      12/18/15 0636           Exercise Review   Progression Yes       Response to Exercise   Blood Pressure (Admit) 112/60 mmHg       Blood Pressure (Exercise) 132/64 mmHg       Blood Pressure (Exit) 94/56 mmHg       Heart Rate (Admit) 94 bpm       Heart Rate (Exercise) 98 bpm       Heart Rate (Exit) 78 bpm       Rating of Perceived Exertion (Exercise) 13       Symptoms None       Comments Reviewed individualized exercise prescription and made increases per departmental policy. Exercise increases were discussed with the patient and they were able to perform the new work loads without issue (no signs or symptoms).        Duration Progress to 30 minutes of continuous aerobic without signs/symptoms of physical distress       Intensity Rest + 30       Progression Continue progressive overload as per policy without signs/symptoms or physical distress.       Resistance Training   Training Prescription Yes       Weight 2       Reps 10-15       Interval Training   Interval Training No       Treadmill   MPH 2.7       Grade 0       Minutes 20       REL-XR   Level 4       Watts 45       Minutes 15          Discharge Exercise Prescription (Final Exercise Prescription Changes):     Exercise Prescription Changes - 12/18/15 0636    Exercise Review   Progression Yes   Response to Exercise   Blood Pressure (Admit) 112/60 mmHg   Blood Pressure (Exercise) 132/64 mmHg   Blood Pressure (Exit) 94/56 mmHg   Heart Rate (Admit) 94 bpm   Heart Rate (Exercise) 98 bpm   Heart Rate (Exit) 78 bpm   Rating of Perceived Exertion (Exercise) 13   Symptoms None   Comments Reviewed individualized exercise prescription and made increases per departmental policy. Exercise  increases were discussed with the patient and they were able to perform the new work loads without issue (no signs or symptoms).    Duration Progress to 30 minutes of continuous aerobic without signs/symptoms of physical distress   Intensity Rest + 30   Progression Continue progressive overload as per policy without signs/symptoms or physical distress.   Resistance Training   Training Prescription Yes   Weight 2   Reps 10-15   Interval Training   Interval Training No   Treadmill   MPH 2.7   Grade 0   Minutes 20   REL-XR   Level 4   Watts 45   Minutes 15      Nutrition:  Target Goals: Understanding of nutrition guidelines, daily intake of sodium <1548m, cholesterol <2041m calories 30% from fat and 7% or less from saturated fats, daily to have 5 or more servings of fruits and vegetables.  Biometrics:     Pre Biometrics - 11/29/15 1617    Pre Biometrics   Height 5'  5" (1.651 m)   Weight 140 lb 6.4 oz (63.685 kg)   Waist Circumference 32 inches   Hip Circumference 39.5 inches   Waist to Hip Ratio 0.81 %   BMI (Calculated) 23.4       Nutrition Therapy Plan and Nutrition Goals:     Nutrition Therapy & Goals - 12/20/15 1652    Nutrition Therapy   Diet [name] [name] avoids greenery foods due to the coumadin and she is doing well with this eventhough it is hard for her. She really enjoys green vegetables and wishes she could eat them.    Drug/Food Interactions Coumadin/Vit K   Personal Nutrition Goals   Personal Goal #1 [name] [name] has been maintaining a gluten free diet for 4 years and this is working very well for her   Personal Goal #2 [name] [name] has been eating low sodium since 1999 and does very well with this.       Nutrition Discharge: Rate Your Plate Scores:   Nutrition Goals Re-Evaluation:     Nutrition Goals Re-Evaluation      12/07/15 1720           Personal Goal #1 Re-Evaluation   Personal Goal #1 [name] [name] said she has cut out salt so she has not needed to really take  her prn Lasix except for about 2 times.        Goal Progress Seen Yes          Psychosocial: Target Goals: Acknowledge presence or absence of depression, maximize coping skills, provide positive support system. Participant is able to verbalize types and ability to use techniques and skills needed for reducing stress and depression.  Initial Review & Psychosocial Screening:     Initial Psych Review & Screening - 11/29/15 [place]? Yes   Comments Pt has good support system, as she has one adult son and adult daughter and 4 grand children.     Barriers   Psychosocial barriers to participate in program There are no identifiable barriers or psychosocial needs.   Screening Interventions   Interventions Encouraged to exercise;Program counselor consult      Quality of Life Scores:     Quality of Life - 11/30/15 1143    Quality of Life Scores   Health/Function Pre 19.73 %   Socioeconomic Pre 30 %   Psych/Spiritual Pre 23.79 %   Family Pre 27 %   GLOBAL Pre 23.7 %      PHQ-9:     Recent Review Flowsheet Data    Depression screen [name] [name] 2/9 11/29/2015 02/02/2015   Decreased Interest 0 0   Down, Depressed, Hopeless 0 0   PHQ - 2 Score 0 0   Altered sleeping 1 -   Tired, decreased energy 1 -   Change in appetite 0 -   Feeling bad or failure about yourself  0 -   Trouble concentrating 0 -   Moving slowly or fidgety/restless 0 -   Suicidal thoughts 0 -   PHQ-9 Score 2 -   Difficult doing work/chores Not difficult at all -      Psychosocial Evaluation and Intervention:     Psychosocial Evaluation - 12/13/15 1658    Psychosocial Evaluation & Interventions   Comments Counselor met with [name] [name] ([name] [name]) for initial psychosocial evaluation.  She is a 75 year old well-adjusted individual who had mitral valve replacement and a pacemaker over the past few months.  She is a widow  who lives alone, but has family and friends close by as well as  active involvement in her local church community.  [name] [name] states that she is sleeping better lately and her appetite is good.  She reports a history of anxiety with some recent symptoms.  She has been prescribed medication to take as needed for anxiety symptoms.  She reports a typically positive mood and has goals to return to her normal independent activities in the near future.  She states the most stressful thing for her currently is having to ask others for help.  Counselor will continue to follow with [name] [name] over the course of this program.     Continued Psychosocial Services Needed --  Counselor presented psychoeducation today on Stress management and [name] [name] was an active participant in this class in order to learn and begin to practice new coping skills in her life to combat stress.        Psychosocial Re-Evaluation:     Psychosocial Re-Evaluation      12/07/15 1723 12/20/15 1619         Psychosocial Re-Evaluation   Interventions Encouraged to attend Cardiac Rehabilitation for the exercise       Comments [name] [name] said "I will be here every time I am suppose to but I have never liked to exercise. I suggested for her to bring her Kindle to read a book while exercising on the Nustep. I suggested a book on tape but [name] [name] said she doesn't like anything in her ear. She will try her Kindle to pass time in Cardiac rehab. [name] [name] said she was stressed today when she came to Cardiac Rehab the revolving door got stuck but the Valet guys fixed it.          Vocational Rehabilitation: Provide vocational rehab assistance to qualifying candidates.   Vocational Rehab Evaluation & Intervention:     Vocational Rehab - 11/29/15 1819    Initial Vocational Rehab Evaluation & Intervention   Assessment shows need for Vocational Rehabilitation No      Education: Education Goals: Education classes will be provided on a weekly basis, covering required topics. Participant will state understanding/return  demonstration of topics presented.  Learning Barriers/Preferences:     Learning Barriers/Preferences - 11/29/15 1816    Learning Barriers/Preferences   Learning Barriers None   Learning Preferences None      Education Topics: General Nutrition Guidelines/Fats and Fiber: -Group instruction provided by verbal, written material, models and posters to present the general guidelines for heart healthy nutrition. Gives an explanation and review of dietary fats and fiber.   Controlling Sodium/Reading Food Labels: -Group verbal and written material supporting the discussion of sodium use in heart healthy nutrition. Review and explanation with models, verbal and written materials for utilization of the food label.   Exercise Physiology & Risk Factors: - Group verbal and written instruction with models to review the exercise physiology of the cardiovascular system and associated critical values. Details cardiovascular disease risk factors and the goals associated with each risk factor.          Cardiac Rehab from 12/27/2015 in [facility] Cardiac and Pulmonary Rehab   Date  12/06/15   Educator  Dorisann Frames   Instruction Review Code  2- meets goals/outcomes      Aerobic Exercise & Resistance Training: - Gives group verbal and written discussion on the health impact of inactivity. On the components of aerobic and resistive training programs and the benefits of this training and how  to safely progress through these programs.      Cardiac Rehab from 12/27/2015 in [facility] Cardiac and Pulmonary Rehab   Date  12/18/15   Educator  RM   Instruction Review Code  2- meets goals/outcomes      Flexibility, Balance, General Exercise Guidelines: - Provides group verbal and written instruction on the benefits of flexibility and balance training programs. Provides general exercise guidelines with specific guidelines to those with heart or lung disease. Demonstration and skill practice provided.      Cardiac  Rehab from 12/27/2015 in [facility] Cardiac and Pulmonary Rehab   Date  12/18/15   Educator  RM   Instruction Review Code  2- meets goals/outcomes      Stress Management: - Provides group verbal and written instruction about the health risks of elevated stress, cause of high stress, and healthy ways to reduce stress.      Cardiac Rehab from 12/27/2015 in [facility] Cardiac and Pulmonary Rehab   Date  12/13/15   Educator  Kathreen Cornfield   Instruction Review Code  2- meets goals/outcomes      Depression: - Provides group verbal and written instruction on the correlation between heart/lung disease and depressed mood, treatment options, and the stigmas associated with seeking treatment.   Anatomy & Physiology of the Heart: - Group verbal and written instruction and models provide basic cardiac anatomy and physiology, with the coronary electrical and arterial systems. Review of: AMI, Angina, Valve disease, Heart Failure, Cardiac Arrhythmia, Pacemakers, and the ICD.      Cardiac Rehab from 12/27/2015 in [facility] Cardiac and Pulmonary Rehab   Date  12/25/15   Educator  SB   Instruction Review Code  2- meets goals/outcomes      Cardiac Procedures: - Group verbal and written instruction and models to describe the testing methods done to diagnose heart disease. Reviews the outcomes of the test results. Describes the treatment choices: Medical Management, Angioplasty, or Coronary Bypass Surgery.   Cardiac Medications: - Group verbal and written instruction to review commonly prescribed medications for heart disease. Reviews the medication, class of the drug, and side effects. Includes the steps to properly store meds and maintain the prescription regimen.   Go Sex-Intimacy & Heart Disease, Get SMART - Goal Setting: - Group verbal and written instruction through game format to discuss heart disease and the return to sexual intimacy. Provides group verbal and written material to discuss and apply goal setting  through the application of the S.M.A.R.T. Method.   Other Matters of the Heart: - Provides group verbal, written materials and models to describe Heart Failure, Angina, Valve Disease, and Diabetes in the realm of heart disease. Includes description of the disease process and treatment options available to the cardiac patient.   Exercise & Equipment Safety: - Individual verbal instruction and demonstration of equipment use and safety with use of the equipment.      Cardiac Rehab from 12/27/2015 in [facility] Cardiac and Pulmonary Rehab   Date  11/29/15   Educator  DW   Instruction Review Code  1- partially meets, needs review/practice      Infection Prevention: - Provides verbal and written material to individual with discussion of infection control including proper hand washing and proper equipment cleaning during exercise session.      Cardiac Rehab from 12/27/2015 in [facility] Cardiac and Pulmonary Rehab   Date  11/29/15   Educator  DW   Instruction Review Code  2- meets goals/outcomes  Falls Prevention: - Provides verbal and written material to individual with discussion of falls prevention and safety.      Cardiac Rehab from 12/27/2015 in [facility] Cardiac and Pulmonary Rehab   Date  12/07/15   Educator  C. EnterkinRN   Instruction Review Code  2- meets goals/outcomes [Discussed when blood pressure was 70/40 to drink water first]      Diabetes: - Individual verbal and written instruction to review signs/symptoms of diabetes, desired ranges of glucose level fasting, after meals and with exercise. Advice that pre and post exercise glucose checks will be done for 3 sessions at entry of program.    Knowledge Questionnaire Score:     Knowledge Questionnaire Score - 11/29/15 1826    Knowledge Questionnaire Score   Pre Score 27//28      Personal Goals and Risk Factors at Admission:     Personal Goals and Risk Factors at Admission - 11/29/15 1836    Personal Goals and Risk Factors on  Admission   Personal Goal Patient would like to be able to make her on bed and vacumn again.     Intervention Other  Start Cardiac Rehab and follow exercise prescription.  Patient's 8 week lifting restriction still in effect at this time.        Personal Goals and Risk Factors Review:      Goals and Risk Factor Review      12/07/15 1721 12/19/15 0639 12/25/15 1814       Increase Aerobic Exercise and Physical Activity   Goals Progress/Improvement seen  Yes Yes      Comments Taelynn said "I will be here every time I am suppose to but I have never liked to exercise. I suggested for her to bring her Kindle to read a book while exercising on the Nustep.  [name] [name] went shopping the other day and was able to walk to all the stores she wanted to go to. She did start to feel some SOB at the last store and she said this indicated she still has some work to do with exercise to further build up her endurance. She is progressing well on all equipment and is very responsive to suggestions and challenges.  [name] [name] was able to walk up her neighbor's inclined driveway today after she walked her driveway. Then she went into [employer] to shop before arriving to [facility].  She stated that she was tired walking into the hosiptal after al the other activity today. She had no concerns nor complaints about the exercise session today.     Abnormal Lipids   Goal --  [name] [name] will follow up with her MD about her cholesterol. still taking statin.  Cholesterol controlled with medications as prescribed, with individualized exercise RX and with personalized nutrition plan. Value goals: LDL < 14m, HDL > 472m Participant states understanding of desired cholesterol values and following prescriptions.     Comments   Is aware of risk factor and the steps to take to help keep levels under control.        Personal Goals Discharge (Final Personal Goals and Risk Factors Review):      Goals and Risk Factor Review - 12/25/15 1814    Increase  Aerobic Exercise and Physical Activity   Comments [name] [name] was able to walk up her neighbor's inclined driveway today after she walked her driveway. Then she went into [employer] to shop [facility] She stated that she was tired walking into the hosiptal after al the  other activity today. She had no concerns nor complaints about the exercise session today.   Abnormal Lipids   Goal Cholesterol controlled with medications as prescribed, with individualized exercise RX and with personalized nutrition plan. Value goals: LDL < 74m, HDL > 434m Participant states understanding of desired cholesterol values and following prescriptions.   Comments Is aware of risk factor and the steps to take to help keep levels under control.      ITP Comments:     ITP Comments      12/03/15 1406 12/29/15 1255         ITP Comments Ready for 30 day review.  Continue with ITP   New to program, has attended orientation will start sessions soon. Ready for  30 day review.  Continue with ITP         Comments:

## 2016-01-01 DIAGNOSIS — Z9889 Other specified postprocedural states: Secondary | ICD-10-CM | POA: Diagnosis not present

## 2016-01-01 DIAGNOSIS — Z8679 Personal history of other diseases of the circulatory system: Secondary | ICD-10-CM

## 2016-01-01 DIAGNOSIS — Z950 Presence of cardiac pacemaker: Secondary | ICD-10-CM

## 2016-01-01 NOTE — Progress Notes (Signed)
Daily Session Note  Patient Details  Name: [name] [name] MRN: [id number] Date of Birth: [birth year] Referring Provider:  Thayer Headings, MD  Encounter Date: 01/01/2016  Check In:     Session Check In - 01/01/16 1608    Check-In   Staff Present Heath Lark, RN, BSN, CCRP;Genieve Ramaswamy, BS, ACSM EP-C, Exercise Physiologist;Other   ER physicians immediately available to respond to emergencies See telemetry face sheet for immediately available ER MD   Medication changes reported     No   Fall or balance concerns reported    No   Warm-up and Cool-down Performed on first and last piece of equipment   VAD Patient? No   Pain Assessment   Currently in Pain? No/denies         Goals Met:  Proper associated with RPD/PD & O2 Sat Exercise tolerated well No report of cardiac concerns or symptoms Strength training completed today  Goals Unmet:  Not Applicable  Goals Comments:    Dr. Emily Filbert is Medical Director for [facility] and [facility] Pulmonary [facility].

## 2016-01-03 ENCOUNTER — Ambulatory Visit (INDEPENDENT_AMBULATORY_CARE_PROVIDER_SITE_OTHER): Payer: Medicare Other

## 2016-01-03 ENCOUNTER — Encounter: Payer: Medicare Other | Attending: Cardiovascular Disease | Admitting: *Deleted

## 2016-01-03 DIAGNOSIS — I4891 Unspecified atrial fibrillation: Secondary | ICD-10-CM | POA: Insufficient documentation

## 2016-01-03 DIAGNOSIS — Z953 Presence of xenogenic heart valve: Secondary | ICD-10-CM | POA: Diagnosis not present

## 2016-01-03 DIAGNOSIS — Z9889 Other specified postprocedural states: Secondary | ICD-10-CM | POA: Diagnosis not present

## 2016-01-03 DIAGNOSIS — Z950 Presence of cardiac pacemaker: Secondary | ICD-10-CM | POA: Insufficient documentation

## 2016-01-03 DIAGNOSIS — Z8679 Personal history of other diseases of the circulatory system: Secondary | ICD-10-CM

## 2016-01-03 LAB — POCT INR: INR: 1.8

## 2016-01-03 NOTE — Progress Notes (Signed)
Daily Session Note  Patient Details  Name: [name] [name] MRN: [id number] Date of Birth: 17-Apr-1941 Referring Provider:  Thayer Headings, MD  Encounter Date: 01/03/2016  Check In:     Session Check In - 01/03/16 1620    Check-In   Staff Present Candiss Norse, MS, ACSM CEP, Exercise Physiologist;Susanne Bice, RN, BSN, CCRP;Carroll Enterkin, RN, BSN   ER physicians immediately available to respond to emergencies See telemetry face sheet for immediately available ER MD   Medication changes reported     No   Fall or balance concerns reported    No   Warm-up and Cool-down Performed on first and last piece of equipment   VAD Patient? No   Pain Assessment   Currently in Pain? No/denies   Multiple Pain Sites No           Exercise Prescription Changes - 01/03/16 1600    Exercise Review   Progression Yes   Response to Exercise   Symptoms None   Comments Increased Jhada's walking speed. She used to walk at 3.0 mph and we are slowly working her back to that intensity. She can successfully execise for the entire 30-57mnutes of cardio.    Duration Progress to 50 minutes of aerobic without signs/symptoms of physical distress   Intensity Rest + 30   Progression Continue progressive overload as per policy without signs/symptoms or physical distress.   Resistance Training   Training Prescription Yes   Weight 2   Reps 10-15   Interval Training   Interval Training No   Treadmill   MPH 2.8   Grade 2   Minutes 20   REL-XR   Level 5   Watts 70   Minutes 15      Goals Met:  Independence with exercise equipment Exercise tolerated well Personal goals reviewed No report of cardiac concerns or symptoms Strength training completed today  Goals Unmet:  Not Applicable  Goals Comments: Patient completed exercise prescription and all exercise goals during rehab session. The exercise was tolerated well and the patient is progressing in the program.    Dr. MEmily Filbertis Medical  Director for [facility].

## 2016-01-03 NOTE — Addendum Note (Signed)
Addended by: Lynford Humphrey on: 01/03/2016 10:46 AM   Modules accepted: Orders

## 2016-01-04 ENCOUNTER — Encounter: Payer: Medicare Other | Admitting: *Deleted

## 2016-01-04 DIAGNOSIS — Z9889 Other specified postprocedural states: Secondary | ICD-10-CM | POA: Diagnosis not present

## 2016-01-04 DIAGNOSIS — Z950 Presence of cardiac pacemaker: Secondary | ICD-10-CM

## 2016-01-04 NOTE — Progress Notes (Signed)
Daily Session Note  Patient Details  Name: [name] [name] MRN: [id number] Date of Birth: 1941/06/05 Referring Provider:  Thayer Headings, MD  Encounter Date: 01/04/2016  Check In:     Session Check In - 01/04/16 [place]    Check-In   Staff Present Heath Lark, RN, BSN, CCRP;Carroll Enterkin, RN, BSN;Diane Joya Gaskins, RN, BSN   ER physicians immediately available to respond to emergencies See telemetry face sheet for immediately available ER MD   Medication changes reported     No   Fall or balance concerns reported    No   Warm-up and Cool-down Performed on first and last piece of equipment   VAD Patient? No   Pain Assessment   Currently in Pain? No/denies         Goals Met:  Exercise tolerated well Personal goals reviewed No report of cardiac concerns or symptoms Strength training completed today  Goals Unmet:  Not Applicable  Goals Comments: Doing well with exercise prescription progression.    Dr. Emily Filbert is Medical Director for [place] and [facility].

## 2016-01-08 ENCOUNTER — Encounter: Payer: Medicare Other | Admitting: *Deleted

## 2016-01-08 DIAGNOSIS — Z9889 Other specified postprocedural states: Secondary | ICD-10-CM | POA: Diagnosis not present

## 2016-01-08 NOTE — Progress Notes (Signed)
Daily Session Note  Patient Details  Name: RAELI WIENS MRN: [id number] Date of Birth: Aug 17, 1941 Referring Provider:  Thayer Headings, MD  Encounter Date: 01/08/2016  Check In:     Session Check In - 01/08/16 1637    Check-In   Location [facility]   Staff Present Jeanell Sparrow, Waunita Schooner, MS, ACSM CEP, Exercise Physiologist;Susanne Bice, RN, BSN, CCRP   Supervising physician immediately available to respond to emergencies See telemetry face sheet for immediately available ER MD   Medication changes reported     No   Fall or balance concerns reported    No   Warm-up and Cool-down Performed on first and last piece of equipment   Resistance Training Performed Yes   VAD Patient? No   Pain Assessment   Currently in Pain? No/denies   Multiple Pain Sites No         Goals Met:  Independence with exercise equipment Exercise tolerated well No report of cardiac concerns or symptoms Strength training completed today  Goals Unmet:  Not Applicable  Goals Comments: Patient completed exercise prescription and all exercise goals during rehab session. The exercise was tolerated well and the patient is progressing in the program.    Dr. Emily Filbert is Medical Director for [place] and [facility].

## 2016-01-09 ENCOUNTER — Encounter: Payer: Self-pay | Admitting: Pulmonary Disease

## 2016-01-09 ENCOUNTER — Ambulatory Visit (INDEPENDENT_AMBULATORY_CARE_PROVIDER_SITE_OTHER): Payer: Medicare Other | Admitting: Pulmonary Disease

## 2016-01-09 VITALS — BP 118/78 | HR 86 | Ht 66.0 in | Wt 144.6 lb

## 2016-01-09 DIAGNOSIS — IMO0001 Reserved for inherently not codable concepts without codable children: Secondary | ICD-10-CM

## 2016-01-09 DIAGNOSIS — I272 Other secondary pulmonary hypertension: Secondary | ICD-10-CM | POA: Diagnosis not present

## 2016-01-09 DIAGNOSIS — J984 Other disorders of lung: Secondary | ICD-10-CM

## 2016-01-09 DIAGNOSIS — R911 Solitary pulmonary nodule: Secondary | ICD-10-CM | POA: Diagnosis not present

## 2016-01-09 NOTE — Progress Notes (Signed)
Subjective:    Patient ID: [name] [name], female    DOB: March 22, 1941, 75 y.o.   MRN: [id number]  C.C.:  Follow-up for Right Lung Opacity & Mild Restrictive Lung Disease.  HPI Right Lung Opacity:  Right lower lobe nodule initially found on CT imaging in September 2016. Appears stable on repeat CT imaging from November 2016.  Mild Restrictive Lung Disease: No evidence for interstitial lung disease on CT imaging. Likely secondary to LVH. No further testing needed.  Pulmonary Hypertension:  Likely secondary to LVH, Diastolic Dysfunction & Mitral valve dysfunction. S/P mitral valve replacement. Reports dyspnea is improving. Reports she is able to go up at least one flight of stairs. Doing cardiac rehab currently. She reports only mild lower extremity edema. Follows with Dr. Acie Fredrickson & Dr. Lovena Le of Cardiology.   Review of Systems Denies any nausea or emesis. No abdominal pain. No fever, chills, or sweats.   Allergies  Allergen Reactions  . Oxycodone-Acetaminophen Other (See Comments)    Other Reaction: migraine  . Meloxicam Other (See Comments)    Severe reflux  . Codeine Other (See Comments)    migraine  . Doxycycline Itching  . Hydrocodone Nausea And Vomiting    MIGRAINE  . Hydromorphone Nausea And Vomiting    migraine  . Oxycodone Other (See Comments)    Severe migraine   Current Outpatient Prescriptions on File Prior to Visit  Medication Sig Dispense Refill  . albuterol (PROVENTIL HFA;VENTOLIN HFA) 108 (90 BASE) MCG/ACT inhaler Inhale 2 puffs into the lungs every 6 (six) hours as needed for wheezing or shortness of breath. 1 Inhaler 2  . ALPRAZolam (XANAX) 0.25 MG tablet Take 1 tablet (0.25 mg total) by mouth at bedtime as needed for anxiety or sleep. 30 tablet 5  . amoxicillin (AMOXIL) 500 MG tablet Take 4 tablets (2,000 mg total) by mouth once. Take 4 tablets 60 minutes prior to dental work 30 tablet 1  . azelastine (ASTELIN) 0.1 % nasal spray Place 1 spray into both nostrils  as needed.   5  . butalbital-acetaminophen-caffeine (FIORICET, ESGIC) 50-325-40 MG per tablet TAKE 1 TABLET BY MOUTH AS NEEDED FOR BACK PAIN OR HEADACHE 90 tablet 0  . furosemide (LASIX) 40 MG tablet Take 40 mg by mouth daily as needed for fluid.    . metoprolol tartrate (LOPRESSOR) 25 MG tablet Take 0.5 tablets (12.5 mg total) by mouth 2 (two) times daily. 30 tablet 1  . montelukast (SINGULAIR) 10 MG tablet Take 10 mg by mouth daily.     . rosuvastatin (CRESTOR) 5 MG tablet Take 1 tablet (5 mg total) by mouth every other day. 45 tablet 3  . traMADol (ULTRAM) 50 MG tablet Take 1-2 tablets (50-100 mg total) by mouth every 4 (four) hours as needed for moderate pain. 30 tablet 0  . warfarin (COUMADIN) 2 MG tablet Take 1 tablet (2 mg total) by mouth as directed. 30 tablet 3   No current facility-administered medications on file prior to visit.   Past Medical History  Diagnosis Date  . SVT (supraventricular tachycardia) ([place])   . MVP (mitral valve prolapse)   . Hyperlipidemia   . Idiopathic thrombocytopenic purpura (ITP)   . Hemorrhoid   . IBS (irritable bowel syndrome)   . Migraine   . Severe mitral regurgitation   . RBBB   . Anxiety associated with depression     Prn alprazolam   . Thoracic aorta atherosclerosis ([place])   . Nodule of right lung   .  Restrictive lung disease     Mild on PFT & likely cardiac in etiology   . 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 Atrial fibrillation, persistent ([place])     DCCV 08/22/2015  . Acute on chronic diastolic (congestive) heart failure ([place])   . 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 S/P Minimally invasive maze operation for atrial fibrillation 10/31/2015    Complete bilateral atrial lesion set using cryothermy and bipolar radiofrequency ablation with clipping of LA appendage via right mini thoracotomy  approach  . Bradycardia post-op bradycardia, pacer dependent    MDT PPM 11/06/15, Dr. Lovena Le   Past Surgical History  Procedure Laterality Date  . Tubal ligation    . Foot surgery    . Varicose vein surgery Right   . Mandible fracture surgery  03-26-13  . Colonoscopy  2003  . Eye surgery Right 2013  . Breast biopsy    . Cardioversion N/A 08/22/2015    Procedure: CARDIOVERSION;  Surgeon: Thayer Headings, MD;  Location: [facility] ENDOSCOPY;  Service: Cardiovascular;  Laterality: N/A;  . Tee without cardioversion N/A 08/22/2015    Procedure: TRANSESOPHAGEAL ECHOCARDIOGRAM (TEE);  Surgeon: Thayer Headings, MD;  Location: [facility] ENDOSCOPY;  Service: Cardiovascular;  Laterality: N/A;  [place] Dates with cardioversion    . Cardiac catheterization N/A 10/18/2015    Procedure: Right/Left Heart Cath and Coronary Angiography;  Surgeon: Sherren Mocha, MD;  Location: [place] CV LAB;  Service: Cardiovascular;  Laterality: N/A;  . Minimally invasive maze procedure N/A 10/31/2015    Procedure: MINIMALLY INVASIVE MAZE PROCEDURE;  Surgeon: Rexene Alberts, MD;  Location: [place];  Service: Open Heart Surgery;  Laterality: N/A;  . Tee without cardioversion N/A 10/31/2015    Procedure: TRANSESOPHAGEAL ECHOCARDIOGRAM (TEE);  Surgeon: Rexene Alberts, MD;  Location: [facility];  Service: Open Heart Surgery;  Laterality: N/A;  . Mitral valve replacement Right 10/31/2015    Procedure: MINIMALLY INVASIVE MITRAL VALVE (MV) REPLACEMENT;  Surgeon: Rexene Alberts, MD;  Location: [place];  Service: Open Heart Surgery;  Laterality: Right;  . Ep implantable device N/A 11/06/2015    Procedure: Pacemaker Implant;  Surgeon: Evans Lance, MD;  Location: [place] CV LAB;  Service: Cardiovascular;  Laterality: N/A;   Family History  Problem Relation Age of Onset  . Hypertension Mother   . Arrhythmia Mother   . Heart failure Mother   . Arrhythmia Brother   . Stroke Brother 67    cerebral hemorrhage, nonsmoker, no HTN  . Stroke Father      from an aneurysm  . Stroke Maternal Aunt 83    cerebral hemorrhage  . Liver cancer Maternal Grandmother   . Atrial fibrillation Son   . Heart attack Neg Hx    Social History   Social History  . Marital Status: Widowed    Spouse Name: Deceased  . Number of Children: N/A  . Years of Education: 47   Social History Main Topics  . Smoking status: Never Smoker   . Smokeless tobacco: Never Used  . Alcohol Use: No  . Drug Use: No  . Sexual Activity: No   Other Topics Concern  . None   Social History Narrative   She is a widow.  Husband died from metastatic renal cell cancer. Originally from [place]. Previously lived in [place] from 1975-[place]. Moved to [place] in 2007.  No mold exposure recently but did have it through a prior work exposure in 1993. Has a masters in public health. No bird exposure.      Objective:   Physical Exam BP 118/78 mmHg  Pulse 86  Ht 5\' 6"  (1.676 m)  Wt 144 lb 9.6 oz (65.59 kg)  BMI 23.35 kg/m2  SpO2 99%  LMP  (Exact Date) General:  Thin, frail female. Awake. Alert. No distress. Integument:  Warm & dry. No rash on exposed skin.  HEENT:  Moist mucus membranes. No nasal turbinate swelling. No scleral icterus or injection. Cardiovascular: Mild bradycardia. Regular rhythm. No edema.  Pulmonary:  Clear bilaterally to auscultation. Normal work of breathing on room air. Symmetric chest wall expansion. Abdomen: Soft. Normal bowel sounds. Nontender. Musculoskeletal:  Normal bulk and tone. No joint deformity or effusion appreciated.  PFT 09/01/15: FVC 2.58 L (84%) FEV1 2.02 L (87%) FEV1/FVC 0.78 FEF 25-75 1.76 L (98%) no bronchodilator response TLC 3.82 L (71%) RV 54% ERV 195% DLCO uncorrected 83%   IMAGING CTA CHEST 10/27/15 (personally reviewed by me): Suggestion of hepatic cysts on CT imaging. 7 mm nodule within the sulcus of right lower lobe lateral subsegment. No developing nodule or opacity appreciated by me. No pleural effusion. No pathologic mediastinal  adenopathy. No pulmonary embolus. Previously noted scarring persists. Calcified granuloma noted within right upper lobe still present.  CT CHEST W/O 08/15/15 (previously reviewed by me): Multiple cystic lesions within the liver. 6 mm nodule inferior lateral segment right lower lobe. Previously noted central opacification within right upper lobe has resolved. Patient's bilateral pleural effusions have largely resolved with a very tiny amount of pleural effusion still present on the right. There appears to be some slight decrease in the consolidation within the medial segment of the right middle lobe point compared with prior imaging. It's difficult to discern whether or not a lung nodule was present on previous imaging given the compression from prior pleural effusion.   CT CHEST W/ 07/31/15 (previously reviewed by me):  Bilateral pleural effusions right greater than left. No pathologic mediastinal adenopathy. Patchy opacity in the right middle lobe. With central opacification present within right upper lobe.  CARDIAC TTE (07/31/15): Difficult study. LV normal in size with mild concentric LVH. EF 60%. Unable to assess diastolic function due to atrial fibrillation. Normal regional function. LA & RA severely enlarged. RV normal in size and function. Pulmonary artery systolic pressure [id number] mmHg. IVC dilated. Aortic valve not well seen. Trace aortic regurgitation. Moderate mitral valve stenosis with mild regurgitation. Mild tricuspid regurgitation. No pulmonic stenosis or regurgitation. No pericardial effusion.  LABS 08/15/15 CBC: 6.3/12.7/38.4/203 BMP: 136/4.6/102/27/24/0.83/100/9.3 INR: 1.4    Assessment & Plan:  75 year old female with right lower lobe lung nodule as well as pulmonary hypertension. Patient has no signs of volume overload today. I reviewed her CT imaging with right lower lobe lung nodule which appear stable between September and November 2016. Patient seems to tolerate it her pacemaker  placement and mitral valve replacement well. I do not believe any further testing is necessary for her restrictive lung disease. We discussed that her risk for malignancy from this lung nodule is approximately 4% by the [place]. 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  1. Right lower lobe nodule: Repeat CT chest without contrast March 2017. 2. Mild restrictive lung disease: Likely secondary to underlying LVH.  No suggestion of ILD on CT imaging. No further testing necessary. 3. Pulmonary hypertension:  Likely secondary to left ventricular dysfunction. Does follow with cardiology. No signs of volume overload. 4. Follow-up: Patient to return to clinic in March 2017 after her CT scan is completed.

## 2016-01-09 NOTE — Patient Instructions (Signed)
1. Call me with any new breathing problems before your next appointment. 2. I will see you back in March after your CT scan.  TESTS ORDERED: 1. CT Chest w/o march 2017

## 2016-01-10 ENCOUNTER — Encounter: Payer: Medicare Other | Admitting: *Deleted

## 2016-01-10 DIAGNOSIS — Z9889 Other specified postprocedural states: Secondary | ICD-10-CM

## 2016-01-10 DIAGNOSIS — Z950 Presence of cardiac pacemaker: Secondary | ICD-10-CM

## 2016-01-10 NOTE — Progress Notes (Signed)
Daily Session Note  Patient Details  Name: [name] [name] MRN: [id number] Date of Birth: [birth year] Referring Provider:  [name], MD  Encounter Date: 01/10/2016  Check In:     Session Check In - 01/10/16 1652    Check-In   Location [facility]   Staff Present Gerlene Burdock, RN, Jana Half, RN, Drusilla Kanner, MS, ACSM CEP, Exercise Physiologist   Supervising physician immediately available to respond to emergencies See telemetry face sheet for immediately available ER MD   Medication changes reported     No   Fall or balance concerns reported    No   Warm-up and Cool-down Performed on first and last piece of equipment   Resistance Training Performed Yes   VAD Patient? No   Pain Assessment   Currently in Pain? No/denies   Multiple Pain Sites No         Goals Met:  Independence with exercise equipment Exercise tolerated well No report of cardiac concerns or symptoms Strength training completed today  Goals Unmet:  Not Applicable  Goals Comments:  Patient completed exercise prescription and all exercise goals during rehab session. The exercise was tolerated well and the patient is progressing in the program.    Dr. Emily Filbert is Medical Director for [place] and [facility].

## 2016-01-12 ENCOUNTER — Encounter: Payer: Self-pay | Admitting: Cardiovascular Disease

## 2016-01-12 ENCOUNTER — Ambulatory Visit (INDEPENDENT_AMBULATORY_CARE_PROVIDER_SITE_OTHER): Payer: Medicare Other | Admitting: Cardiovascular Disease

## 2016-01-12 VITALS — BP 104/82 | HR 84 | Ht 66.0 in | Wt 143.8 lb

## 2016-01-12 DIAGNOSIS — Z954 Presence of other heart-valve replacement: Secondary | ICD-10-CM | POA: Diagnosis not present

## 2016-01-12 DIAGNOSIS — I481 Persistent atrial fibrillation: Secondary | ICD-10-CM

## 2016-01-12 DIAGNOSIS — I4891 Unspecified atrial fibrillation: Secondary | ICD-10-CM | POA: Diagnosis not present

## 2016-01-12 DIAGNOSIS — I340 Nonrheumatic mitral (valve) insufficiency: Secondary | ICD-10-CM | POA: Diagnosis not present

## 2016-01-12 DIAGNOSIS — I4819 Other persistent atrial fibrillation: Secondary | ICD-10-CM

## 2016-01-12 DIAGNOSIS — Z952 Presence of prosthetic heart valve: Secondary | ICD-10-CM

## 2016-01-12 MED ORDER — METOPROLOL TARTRATE 25 MG PO TABS: 12.5000 mg | ORAL_TABLET | Freq: Two times a day (BID) | ORAL | Status: DC

## 2016-01-12 NOTE — Progress Notes (Signed)
[name] [name] Date of Birth  15-Jan-1941       [facility] Office [address], [address], [place], [place]  [address]   [place], [place]  [address] [phone]     [phone]   Fax  [phone]    Fax [phone]  Problem List: 1. Supraventricular tachycardia 2. Mitral valve prolapse - s/p mitral valve replacement  3. Cataracts 4.   History of Present Illness:  Akilah is a  75 yo with hx of SVT and MVP.  She needs to have cataract surgery and is hear for pre op evaluation.  She has done well.  She has not been exercising as much as she would like.  She has not had any syncope or presyncope.  She has been taking care of her granddaughter who has ADD and a sleep disorder.  Rabiah did not get much sleep that weekend and had lots of palpitations for the following week.  Oct. 7, 2014  Atenas is seen after a 1 1/2 year absence.  She has had some fatigue.   Dr. Derrel Nip has found thrombocytopenia.  Crestor was stopped but this did not help the thrombocytopenia.   She has seen a hemotologist.    She fell  (caught her toe at a [employer] - not syncope) several months ago and had reconstruction of her jaw.  She also broke her left wrist.     Oct. 2, 2015 :  Chonte is doing well from a cardiac standpoint.  Her cholesterol levels are slightly elevated ( even on Crestor 5 mg a day).  She has had marginally low platelets .  Her cholesterol levels are still minimally elevated.    She had some dyspnea this summer.  Currently , she has no limitations.  She thinks the symptoms may have been due to allergies.  No chest pain.    She has had several falls this year .  She thinks that her stamina is not as good as it should be.  She does yoga but no cardio workouts.    Sept 6, 2016:  Marylene is seen today for a recent episode of paroxysmal atrial fib August 21 was shopping , felt very weak .   Could not walk to her car.  Very short of breath .    She went to [place] a day or so later.  Gradually had worsening chest tightness, allergy, weakness.  Thought it was allergies.  Had significant leg swelling .   She went to the hospital. She was found have rapid atrial fibrillation with evidence of congestive heart failure. Her echocardiogram showed normal left ventricle systolic function. She has mild mitral regurgitation. Was significant calcification of the mitral valve annulus. She was started on Lovenox. She's not received Coumadin at this point.  She was started on diltiazem and Lasix.  Was started on Lovenox ( because of the atrial fib in the setting of MR and itral valve calcification .   She seems to be doing quite a lot at her. She's been going to doctors appointments and has been walking across the parking lot  without any CP with problems.  Sept. 13, 2016:  Arika is seen back for a 1 week visit. She was found to have rapid atrial fib last week Had some dyspnea last night - better with a Xopenex sample inhaler.  Feels better on the Eliquis and Metoprolol .  Feels more relaxed.  Cannot tell if she is in atrial fib Has low energy .  Difficult to walk to the mailbox without extreme fatigue , not specifically short of breath   Oct. 11, 2016:  Was cardioverted several weeks ago.  Feeling better every day since that   Nov. 10, 2016:  Has done well.  Steady improvement since  the cardioversion.  Then for the past several days/ weeks has not been doing quite a good Has shortness of breath with showering and doing everyday activities.    Feb. 10, 2017:  Is doing well - has had Mitral valve replacement and MAZE procedure since I last saw her  Just this past week, she felt much better.   Has been fatigued but now has more energetic . Has gain   Current Outpatient Prescriptions on File Prior to Visit  Medication Sig Dispense Refill  . albuterol (PROVENTIL HFA;VENTOLIN HFA) 108 (90 BASE) MCG/ACT inhaler Inhale 2 puffs into the  lungs every 6 (six) hours as needed for wheezing or shortness of breath. 1 Inhaler 2  . ALPRAZolam (XANAX) 0.25 MG tablet Take 1 tablet (0.25 mg total) by mouth at bedtime as needed for anxiety or sleep. 30 tablet 5  . amoxicillin (AMOXIL) 500 MG tablet Take 4 tablets (2,000 mg total) by mouth once. Take 4 tablets 60 minutes prior to dental work 30 tablet 1  . azelastine (ASTELIN) 0.1 % nasal spray Place 1 spray into both nostrils as needed.   5  . butalbital-acetaminophen-caffeine (FIORICET, ESGIC) 50-325-40 MG per tablet TAKE 1 TABLET BY MOUTH AS NEEDED FOR BACK PAIN OR HEADACHE 90 tablet 0  . furosemide (LASIX) 40 MG tablet Take 40 mg by mouth daily as needed for fluid.    . metoprolol tartrate (LOPRESSOR) 25 MG tablet Take 0.5 tablets (12.5 mg total) by mouth 2 (two) times daily. 30 tablet 1  . montelukast (SINGULAIR) 10 MG tablet Take 10 mg by mouth daily.     . rosuvastatin (CRESTOR) 5 MG tablet Take 1 tablet (5 mg total) by mouth every other day. 45 tablet 3  . traMADol (ULTRAM) 50 MG tablet Take 1-2 tablets (50-100 mg total) by mouth every 4 (four) hours as needed for moderate pain. 30 tablet 0  . warfarin (COUMADIN) 2 MG tablet Take 1 tablet (2 mg total) by mouth as directed. 30 tablet 3   No current facility-administered medications on file prior to visit.    Allergies  Allergen Reactions  . Oxycodone-Acetaminophen Other (See Comments)    Other Reaction: migraine  . Meloxicam Other (See Comments)    Severe reflux  . Codeine Other (See Comments)    migraine  . Doxycycline Itching  . Hydrocodone Nausea And Vomiting    MIGRAINE  . Hydromorphone Nausea And Vomiting    migraine  . Oxycodone Other (See Comments)    Severe migraine    Past Medical History  Diagnosis Date  . SVT (supraventricular tachycardia) ([place])   . MVP (mitral valve prolapse)   . Hyperlipidemia   . Idiopathic thrombocytopenic purpura (ITP)   . Hemorrhoid   . IBS (irritable bowel syndrome)   . Migraine     . Severe mitral regurgitation   . RBBB   . Anxiety associated with depression     Prn alprazolam   . Thoracic aorta atherosclerosis ([place])   . Nodule of right lung   . Restrictive lung disease     Mild on PFT & likely cardiac in etiology   . History of  asbestos exposure   . Atrial fibrillation, persistent ([place])     DCCV 08/22/2015  . Acute on chronic diastolic (congestive) heart failure ([place])   . 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 S/P Minimally invasive maze operation for atrial fibrillation 10/31/2015    Complete bilateral atrial lesion set using cryothermy and bipolar radiofrequency ablation with clipping of LA appendage via right mini thoracotomy approach  . Bradycardia post-op bradycardia, pacer dependent    MDT PPM 11/06/15, Dr. Lovena Le    Past Surgical History  Procedure Laterality Date  . Tubal ligation    . Foot surgery    . Varicose vein surgery Right   . Mandible fracture surgery  03-26-13  . Colonoscopy  2003  . Eye surgery Right 2013  . Breast biopsy    . Cardioversion N/A 08/22/2015    Procedure: CARDIOVERSION;  Surgeon: Thayer Headings, MD;  Location: [facility] ENDOSCOPY;  Service: Cardiovascular;  Laterality: N/A;  . Tee without cardioversion N/A 08/22/2015    Procedure: TRANSESOPHAGEAL ECHOCARDIOGRAM (TEE);  Surgeon: Thayer Headings, MD;  Location: [facility] ENDOSCOPY;  Service: Cardiovascular;  Laterality: N/A;  Darden Dates with cardioversion    . Cardiac catheterization N/A 10/18/2015    Procedure: Right/Left Heart Cath and Coronary Angiography;  Surgeon: Sherren Mocha, MD;  Location: [place] CV LAB;  Service: Cardiovascular;  Laterality: N/A;  . Minimally invasive maze procedure N/A 10/31/2015    Procedure: MINIMALLY INVASIVE MAZE PROCEDURE;  Surgeon: Rexene Alberts, MD;  Location: [place];  Service: Open Heart Surgery;   Laterality: N/A;  . Tee without cardioversion N/A 10/31/2015    Procedure: TRANSESOPHAGEAL ECHOCARDIOGRAM (TEE);  Surgeon: Rexene Alberts, MD;  Location: [place];  Service: Open Heart Surgery;  Laterality: N/A;  . Mitral valve replacement Right 10/31/2015    Procedure: MINIMALLY INVASIVE MITRAL VALVE (MV) REPLACEMENT;  Surgeon: Rexene Alberts, MD;  Location: [place];  Service: Open Heart Surgery;  Laterality: Right;  . Ep implantable device N/A 11/06/2015    Procedure: Pacemaker Implant;  Surgeon: Evans Lance, MD;  Location: [place] CV LAB;  Service: Cardiovascular;  Laterality: N/A;    History  Smoking status  . Never Smoker   Smokeless tobacco  . Never Used    History  Alcohol Use No    Family History  Problem Relation Age of Onset  . Hypertension Mother   . Arrhythmia Mother   . Heart failure Mother   . Arrhythmia Brother   . Stroke Brother 28    cerebral hemorrhage, nonsmoker, no HTN  . Stroke Father     from an aneurysm  . Stroke Maternal Aunt 83    cerebral hemorrhage  . Liver cancer Maternal Grandmother   . Atrial fibrillation Son   . Heart attack Neg Hx     Reviw of Systems:  Reviewed in the HPI.  All other systems are negative.  Physical Exam: Blood pressure 104/82, pulse 84, height 5\' 6"  (1.676 m), weight 143 lb 12.8 oz (65.227 kg). General: Well developed, well nourished, in no acute distress. Head: Normocephalic, atraumatic, sclera non-icteric, mucus membranes are moist,  Neck: Supple. Carotids are 2 + without bruits. No JVD Lungs: Clear bilaterally to auscultation. Heart:  RR with occasional premature beats , chest is non tender  Abdomen: Soft, non-tender, non-distended with normal bowel sounds. No hepatomegaly. No rebound/guarding. No masses.  Msk:  Strength and tone are normal Extremities: No clubbing or cyanosis. No edema.  Distal pedal pulses are 2+ and equal bilaterally. Neuro: Alert and oriented X 3. Moves all extremities spontaneously. Psych:   Responds to questions appropriately with a normal affect.  ECG: AV pacing at 74.    Assessment / Plan:   1. Persistent Atrial fibrillation with rapid ventricular response: she is maintaining NSR.  She status post MAZE procedure . probalby continue coumadin for a total of 3 months after surgery . Will let Dr. Roxy Manns comment on that.  2.  MVP with MR:  Now status post mitral valve replacement. She is doing quite well. She is getting over anesthesia and her energy levels have improved.  3. Congestive heart failure: her CHF symptoms have greatly improved.  4. Pacemaker: She status post pacemaker implantation.    Nahser, Wonda Cheng, MD  01/12/2016 9:32 AM    [address],  [place], [place]  [address] Pager [phone] Phone: [phone]; Fax: [phone]   [facility]  [address] [place], [place]  [address] [phone]   Fax [phone]

## 2016-01-12 NOTE — Patient Instructions (Addendum)
Medication Instructions:  Your physician recommends that you continue on your current medications as directed. Please refer to the Current Medication list given to you today. A refill of your Metoprolol was sent today   Labwork: None Ordered   Testing/Procedures: None Ordered   Follow-Up: Your physician recommends that you schedule a follow-up appointment in: 3 months with Dr. Acie Fredrickson.    If you need a refill on your cardiac medications before your next appointment, please call your pharmacy.   Thank you for choosing [facility]! Christen Bame, RN [phone]

## 2016-01-15 ENCOUNTER — Encounter: Payer: Medicare Other | Admitting: *Deleted

## 2016-01-15 DIAGNOSIS — Z9889 Other specified postprocedural states: Secondary | ICD-10-CM | POA: Diagnosis not present

## 2016-01-15 NOTE — Progress Notes (Signed)
Daily Session Note  Patient Details  Name: [name] [name] MRN: [id number] Date of Birth: [birth year] Referring Provider:  Thayer Headings, MD  Encounter Date: 01/15/2016  Check In:     Session Check In - 01/15/16 1627    Check-In   Location [facility]   Staff Present Heath Lark, RN, BSN, CCRP;Renee Dillard [place], MS, ACSM CEP, Exercise Physiologist;Rebecca Brayton El, PT   Supervising physician immediately available to respond to emergencies See telemetry face sheet for immediately available ER MD   Medication changes reported     No   Fall or balance concerns reported    No   Warm-up and Cool-down Performed on first and last piece of equipment   Resistance Training Performed Yes   VAD Patient? No   Pain Assessment   Currently in Pain? No/denies           Exercise Prescription Changes - 01/15/16 1600    Exercise Review   Progression Yes   Response to Exercise   Symptoms None   Comments Pt increased treadmill time with no adverse reactions noted.  Patient demonstrates good understanding of prescription change.   Duration Progress to 50 minutes of aerobic without signs/symptoms of physical distress   Intensity Rest + 30   Progression Continue progressive overload as per policy without signs/symptoms or physical distress.   Resistance Training   Training Prescription Yes   Weight 2   Reps 10-15   Interval Training   Interval Training No   Treadmill   MPH 2.8   Grade 2   Minutes 25   REL-XR   Level 5   Watts 70   Minutes 15      Goals Met:  Independence with exercise equipment Exercise tolerated well Personal goals reviewed No report of cardiac concerns or symptoms Strength training completed today  Goals Unmet:  Not Applicable  Goals Comments: Reviewed individualized exercise prescription and made increases per departmental policy. Exercise increases were discussed with the patient and they were able to perform the new work loads without  issue (no signs or symptoms).     Dr. Emily Filbert is Medical Director for [facility] and [facility].

## 2016-01-17 ENCOUNTER — Ambulatory Visit (INDEPENDENT_AMBULATORY_CARE_PROVIDER_SITE_OTHER): Payer: Medicare Other

## 2016-01-17 DIAGNOSIS — Z8679 Personal history of other diseases of the circulatory system: Secondary | ICD-10-CM

## 2016-01-17 DIAGNOSIS — Z9889 Other specified postprocedural states: Secondary | ICD-10-CM

## 2016-01-17 DIAGNOSIS — Z953 Presence of xenogenic heart valve: Secondary | ICD-10-CM

## 2016-01-17 LAB — POCT INR: INR: 1.8

## 2016-01-17 MED ORDER — WARFARIN SODIUM 1 MG PO TABS: 1.0000 mg | ORAL_TABLET | Freq: Every day | ORAL | Status: DC

## 2016-01-18 ENCOUNTER — Encounter: Payer: Medicare Other | Admitting: *Deleted

## 2016-01-18 DIAGNOSIS — Z9889 Other specified postprocedural states: Secondary | ICD-10-CM

## 2016-01-18 NOTE — Progress Notes (Signed)
Daily Session Note  Patient Details  Name: COLETTE DICAMILLO MRN: [id number] Date of Birth: Nov 05, 1941 Referring Provider:  Thayer Headings, MD  Encounter Date: 01/18/2016  Check In:     Session Check In - 01/18/16 1631    Check-In   Location [facility]   Staff Present Heath Lark, RN, BSN, CCRP;Kaiyana Bedore, RN, Jana Half, RN, BSN   Supervising physician immediately available to respond to emergencies See telemetry face sheet for immediately available ER MD   Medication changes reported     No   Fall or balance concerns reported    No   Warm-up and Cool-down Performed on first and last piece of equipment   Resistance Training Performed Yes   VAD Patient? No   Pain Assessment   Currently in Pain? No/denies         Goals Met:  Proper associated with RPD/PD & O2 Sat Exercise tolerated well  Goals Unmet:  Not Applicable  Goals Comments:    Dr. Emily Filbert is Medical Director for [place] and [facility].

## 2016-01-22 DIAGNOSIS — Z9889 Other specified postprocedural states: Secondary | ICD-10-CM | POA: Diagnosis not present

## 2016-01-22 DIAGNOSIS — Z950 Presence of cardiac pacemaker: Secondary | ICD-10-CM

## 2016-01-22 NOTE — Progress Notes (Signed)
Daily Session Note  Patient Details  Name: [name] [name] MRN: [id number] Date of Birth: [birth year] Referring Provider:  [name], MD  Encounter Date: 01/22/2016  Check In:     Session Check In - 01/22/16 1635    Check-In   Location [facility]   Staff Present Heath Lark, RN, BSN, CCRP;Zaydrian Batta, BS, ACSM EP-C, Exercise Physiologist;Rebecca Sickles, PT   Supervising physician immediately available to respond to emergencies See telemetry face sheet for immediately available ER MD   Medication changes reported     No   Fall or balance concerns reported    No   Warm-up and Cool-down Performed on first and last piece of equipment   Resistance Training Performed No   VAD Patient? No   Pain Assessment   Currently in Pain? Yes   Pain Score 1    Pain Location Back         Goals Met:  Proper associated with RPD/PD & O2 Sat Exercise tolerated well No report of cardiac concerns or symptoms Strength training completed today  Goals Unmet:  Not Applicable  Goals Comments:    Dr. Emily Filbert is Medical Director for [facility] and [facility].

## 2016-01-23 NOTE — Progress Notes (Signed)
Cardiac Individual Treatment Plan  Patient Details  Name: [name] [name] MRN: [id number] Date of Birth: [birth year] Referring Provider:  [name], MD  Initial Encounter Date:    Visit Diagnosis: S/P mitral valve repair  S/P placement of cardiac pacemaker  Patient's Home Medications on Admission:  Current outpatient prescriptions:  .  albuterol (PROVENTIL HFA;VENTOLIN HFA) 108 (90 BASE) MCG/ACT inhaler, Inhale 2 puffs into the lungs every 6 (six) hours as needed for wheezing or shortness of breath., Disp: 1 Inhaler, Rfl: 2 .  ALPRAZolam (XANAX) 0.25 MG tablet, Take 1 tablet (0.25 mg total) by mouth at bedtime as needed for anxiety or sleep., Disp: 30 tablet, Rfl: 5 .  amoxicillin (AMOXIL) 500 MG tablet, Take 4 tablets (2,000 mg total) by mouth once. Take 4 tablets 60 minutes prior to dental work, Disp: 30 tablet, Rfl: 1 .  azelastine (ASTELIN) 0.1 % nasal spray, Place 1 spray into both nostrils as needed. , Disp: , Rfl: 5 .  butalbital-acetaminophen-caffeine (FIORICET, ESGIC) 50-325-40 MG per tablet, TAKE 1 TABLET BY MOUTH AS NEEDED FOR BACK PAIN OR HEADACHE, Disp: 90 tablet, Rfl: 0 .  furosemide (LASIX) 40 MG tablet, Take 40 mg by mouth daily as needed for fluid., Disp: , Rfl:  .  metoprolol tartrate (LOPRESSOR) 25 MG tablet, Take 0.5 tablets (12.5 mg total) by mouth 2 (two) times daily., Disp: 90 tablet, Rfl: 3 .  montelukast (SINGULAIR) 10 MG tablet, Take 10 mg by mouth daily. , Disp: , Rfl:  .  rosuvastatin (CRESTOR) 5 MG tablet, Take 1 tablet (5 mg total) by mouth every other day., Disp: 45 tablet, Rfl: 3 .  traMADol (ULTRAM) 50 MG tablet, Take 1-2 tablets (50-100 mg total) by mouth every 4 (four) hours as needed for moderate pain., Disp: 30 tablet, Rfl: 0 .  warfarin (COUMADIN) 1 MG tablet, Take 1 tablet (1 mg total) by mouth daily., Disp: 90 tablet, Rfl: 3 .  warfarin (COUMADIN) 2 MG tablet, Take 1 tablet (2 mg total) by mouth as directed., Disp: 30 tablet, Rfl: 3  Past  Medical History: Past Medical History  Diagnosis Date  . SVT (supraventricular tachycardia) ([place])   . MVP (mitral valve prolapse)   . Hyperlipidemia   . Idiopathic thrombocytopenic purpura (ITP)   . Hemorrhoid   . IBS (irritable bowel syndrome)   . Migraine   . Severe mitral regurgitation   . RBBB   . Anxiety associated with depression     Prn alprazolam   . Thoracic aorta atherosclerosis ([place])   . Nodule of right lung   . Restrictive lung disease     Mild on PFT & likely cardiac in etiology   . 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 Atrial fibrillation, persistent ([place])     DCCV 08/22/2015  . Acute on chronic diastolic (congestive) heart failure ([place])   . 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 S/P Minimally invasive maze operation for atrial fibrillation 10/31/2015    Complete bilateral atrial lesion set using cryothermy and bipolar radiofrequency ablation with clipping of LA appendage via right mini thoracotomy approach  . Bradycardia post-op bradycardia, pacer dependent    MDT PPM 11/06/15, Dr. Lovena Le    Tobacco Use: History  Smoking status  . Never Smoker   Smokeless tobacco  . Never Used    Labs: Recent Review Flowsheet Data  Labs for ITP Cardiac and Pulmonary Rehab Latest Ref Rng 11/01/2015 11/01/2015 11/01/2015 11/01/2015 11/01/2015   PHART 7.350 - 7.450 7.391 7.391 - 7.226(L) -   PCO2ART 35.0 - 45.0 mmHg 38.8 37.9 - 59.5(HH) -   HCO3 20.0 - 24.0 mEq/L 23.6 23.1 - 24.7(H) -   TCO2 0 - 100 mmol/L '25 24 25 26 22   ' ACIDBASEDEF 0.0 - 2.0 mmol/L 1.0 2.0 - 4.0(H) -   O2SAT - 97.0 95.0 - 100.0 -       Exercise Target Goals:    Exercise Program Goal: Individual exercise prescription set with THRR, safety & activity barriers. Participant demonstrates ability to understand and report RPE using BORG scale, to  self-measure pulse accurately, and to acknowledge the importance of the exercise prescription.  Exercise Prescription Goal: Starting with aerobic activity 30 plus minutes a day, 3 days per week for initial exercise prescription. Provide home exercise prescription and guidelines that participant acknowledges understanding prior to discharge.  Activity Barriers & Risk Stratification:     Activity Barriers & Cardiac Risk Stratification - 11/29/15 1849    Activity Barriers & Cardiac Risk Stratification   Activity Barriers Incisional Pain;History of Falls;Arthritis;Back Problems;Other (comment)   Comments Incisional pain on right chest from MVR/s/p two chest tubes.  Patient is on an eight week lifting restriction of right arm through January 24th, 2017.        6 Minute Walk:     6 Minute Walk      11/29/15 1618       6 Minute Walk   Phase Initial     Distance 1275 feet     Walk Time 6 minutes     RPE 13     Symptoms No     Resting HR 88 bpm     Resting BP 118/74 mmHg     Max Ex. HR 113 bpm     Max Ex. BP 126/62 mmHg        Initial Exercise Prescription:     Initial Exercise Prescription - 11/29/15 1600    Date of Initial Exercise Prescription   Date 11/29/15   Treadmill   MPH 2   Grade 0   Minutes 10   Bike   Level 0.4   Minutes 10   Recumbant Bike   Level 3   RPM 40   Watts 25   Minutes 10   NuStep   Level 2   Watts 30   Minutes 15   Arm Ergometer   Level 1   Watts 8   Minutes 10   Arm/Foot Ergometer   Level 4   Watts 12   Minutes 10   Cybex   Level 1   RPM 50   Minutes 10   Recumbant Elliptical   Level 1   RPM 40   Watts 10   Minutes 10   Elliptical   Level 1   Speed 3   Minutes 1   REL-XR   Level 3   Watts 35   Minutes 15   T5 Nustep   Level 2   Watts 20   Minutes 15   Biostep-RELP   Level 3   Watts 20   Minutes 15   Prescription Details   Frequency (times per week) 3   Duration Progress to 30 minutes of continuous aerobic  without signs/symptoms of physical distress   Intensity   THRR REST +  30   Ratings of Perceived Exertion 11-15   Progression  Continue progressive overload as per policy without signs/symptoms or physical distress.   Resistance Training   Training Prescription Yes   Weight 2   Reps 10-15      Exercise Prescription Changes:     Exercise Prescription Changes      12/18/15 0636 01/03/16 1600 01/15/16 1600       Exercise Review   Progression Yes Yes Yes     Response to Exercise   Blood Pressure (Admit) 112/60 mmHg  118/64 mmHg     Blood Pressure (Exercise) 132/64 mmHg  122/72 mmHg     Blood Pressure (Exit) 94/56 mmHg  104/60 mmHg     Heart Rate (Admit) 94 bpm  92 bpm     Heart Rate (Exercise) 98 bpm  101 bpm     Heart Rate (Exit) 78 bpm  82 bpm     Rating of Perceived Exertion (Exercise) 13  13     Symptoms None None None     Comments Reviewed individualized exercise prescription and made increases per departmental policy. Exercise increases were discussed with the patient and they were able to perform the new work loads without issue (no signs or symptoms).  Increased Karista's walking speed. She used to walk at 3.0 mph and we are slowly working her back to that intensity. She can successfully execise for the entire 30-58mnutes of cardio.  Pt increased treadmill time with no adverse reactions noted.  Patient demonstrates good understanding of prescription change. Overall, patient is progressing well in program and works well independently. NViolais doing more at home and walking on days she does not come to exercise. We will continue to follow up with NIzora Galaon her home exercise.      Frequency   Add 2 additional days to program exercise sessions.     Duration Progress to 30 minutes of continuous aerobic without signs/symptoms of physical distress Progress to 50 minutes of aerobic without signs/symptoms of physical distress Progress to 50 minutes of aerobic without signs/symptoms of  physical distress     Intensity Rest + 30 Rest + 30 Rest + 30     Progression Continue progressive overload as per policy without signs/symptoms or physical distress. Continue progressive overload as per policy without signs/symptoms or physical distress. Continue progressive overload as per policy without signs/symptoms or physical distress.     Resistance Training   Training Prescription Yes Yes Yes     Weight '2 2 2     ' Reps 10-15 10-15 10-15     Interval Training   Interval Training No No No     Treadmill   MPH 2.7 2.8 2.8     Grade 0 2 2     Minutes '20 20 25     ' REL-XR   Level '4 5 6     ' Watts 45 70 70     Minutes '15 15 15        ' Discharge Exercise Prescription (Final Exercise Prescription Changes):     Exercise Prescription Changes - 01/15/16 1600    Exercise Review   Progression Yes   Response to Exercise   Blood Pressure (Admit) 118/64 mmHg   Blood Pressure (Exercise) 122/72 mmHg   Blood Pressure (Exit) 104/60 mmHg   Heart Rate (Admit) 92 bpm   Heart Rate (Exercise) 101 bpm   Heart Rate (Exit) 82 bpm   Rating of Perceived Exertion (Exercise) 13   Symptoms None   Comments Pt increased treadmill time with no adverse reactions noted.  Patient demonstrates good understanding of prescription change. Overall, patient is progressing well in program and works well independently. Salwa is doing more at home and walking on days she does not come to exercise. We will continue to follow up with Izora Gala on her home exercise.    Frequency Add 2 additional days to program exercise sessions.   Duration Progress to 50 minutes of aerobic without signs/symptoms of physical distress   Intensity Rest + 30   Progression Continue progressive overload as per policy without signs/symptoms or physical distress.   Resistance Training   Training Prescription Yes   Weight 2   Reps 10-15   Interval Training   Interval Training No   Treadmill   MPH 2.8   Grade 2   Minutes 25   REL-XR   Level  6   Watts 70   Minutes 15      Nutrition:  Target Goals: Understanding of nutrition guidelines, daily intake of sodium <1560m, cholesterol <2057m calories 30% from fat and 7% or less from saturated fats, daily to have 5 or more servings of fruits and vegetables.  Biometrics:     Pre Biometrics - 11/29/15 1617    Pre Biometrics   Height '5\' 5"'  (1.651 m)   Weight 140 lb 6.4 oz (63.685 kg)   Waist Circumference 32 inches   Hip Circumference 39.5 inches   Waist to Hip Ratio 0.81 %   BMI (Calculated) 23.4       Nutrition Therapy Plan and Nutrition Goals:     Nutrition Therapy & Goals - 12/20/15 1652    Nutrition Therapy   Diet NaQuintavoids greenery foods due to the coumadin and she is doing well with this eventhough it is hard for her. She really enjoys green vegetables and wishes she could eat them.    Drug/Food Interactions Coumadin/Vit K   Personal Nutrition Goals   Personal Goal #1 NaLandyas been maintaining a gluten free diet for 4 years and this is working very well for her   Personal Goal #2 NaAlexzandraas been eating low sodium since 1999 and does very well with this.       Nutrition Discharge: Rate Your Plate Scores:   Nutrition Goals Re-Evaluation:     Nutrition Goals Re-Evaluation      12/07/15 1720           Personal Goal #1 Re-Evaluation   Personal Goal #1 NaIzora Galaaid she has cut out salt so she has not needed to really take her prn Lasix except for about 2 times.        Goal Progress Seen Yes          Psychosocial: Target Goals: Acknowledge presence or absence of depression, maximize coping skills, provide positive support system. Participant is able to verbalize types and ability to use techniques and skills needed for reducing stress and depression.  Initial Review & Psychosocial Screening:     Initial Psych Review & Screening - 11/29/15 [place]   Comments Pt has good support system, as she has one adult  son and adult daughter and 4 grand children.     Barriers   Psychosocial barriers to participate in program There are no identifiable barriers or psychosocial needs.   Screening Interventions   Interventions Encouraged to exercise;Program counselor consult      Quality of Life Scores:     Quality of Life - 11/30/15 1143  Quality of Life Scores   Health/Function Pre 19.73 %   Socioeconomic Pre 30 %   Psych/Spiritual Pre 23.79 %   Family Pre 27 %   GLOBAL Pre 23.7 %      PHQ-9:     Recent Review Flowsheet Data    Depression screen [facility] 2/9 11/29/2015 02/02/2015   Decreased Interest 0 0   Down, Depressed, Hopeless 0 0   PHQ - 2 Score 0 0   Altered sleeping 1 -   Tired, decreased energy 1 -   Change in appetite 0 -   Feeling bad or failure about yourself  0 -   Trouble concentrating 0 -   Moving slowly or fidgety/restless 0 -   Suicidal thoughts 0 -   PHQ-9 Score 2 -   Difficult doing work/chores Not difficult at all -      Psychosocial Evaluation and Intervention:     Psychosocial Evaluation - 12/13/15 1658    Psychosocial Evaluation & Interventions   Comments Counselor met with Ms. Dement (Ms. J) for initial psychosocial evaluation.  She is a 75 year old well-adjusted individual who had mitral valve replacement and a pacemaker over the past few months.  She is a widow who lives alone, but has family and friends close by as well as active involvement in her local church community.  Ms. Lenna Sciara states that she is sleeping better lately and her appetite is good.  She reports a history of anxiety with some recent symptoms.  She has been prescribed medication to take as needed for anxiety symptoms.  She reports a typically positive mood and has goals to return to her normal independent activities in the near future.  She states the most stressful thing for her currently is having to ask others for help.  Counselor will continue to follow with Ms. Lenna Sciara over the course of this program.      Continued Psychosocial Services Needed --  Counselor presented psychoeducation today on Stress management and Ms. J was an active participant in this class in order to learn and begin to practice new coping skills in her life to combat stress.        Psychosocial Re-Evaluation:     Psychosocial Re-Evaluation      12/07/15 1723 12/20/15 1619         Psychosocial Re-Evaluation   Interventions Encouraged to attend Cardiac Rehabilitation for the exercise       Comments Robina said "I will be here every time I am suppose to but I have never liked to exercise. I suggested for her to bring her Kindle to read a book while exercising on the Nustep. I suggested a book on tape but Jayleah said she doesn't like anything in her ear. She will try her Kindle to pass time in Cardiac rehab. Mysha said she was stressed today when she came to Cardiac Rehab the revolving door got stuck but the Valet guys fixed it.          Vocational Rehabilitation: Provide vocational rehab assistance to qualifying candidates.   Vocational Rehab Evaluation & Intervention:     Vocational Rehab - 11/29/15 1819    Initial Vocational Rehab Evaluation & Intervention   Assessment shows need for Vocational Rehabilitation No      Education: Education Goals: Education classes will be provided on a weekly basis, covering required topics. Participant will state understanding/return demonstration of topics presented.  Learning Barriers/Preferences:     Learning Barriers/Preferences - 11/29/15 1816  Learning Barriers/Preferences   Learning Barriers None   Learning Preferences None      Education Topics: General Nutrition Guidelines/Fats and Fiber: -Group instruction provided by verbal, written material, models and posters to present the general guidelines for heart healthy nutrition. Gives an explanation and review of dietary fats and fiber.   Controlling Sodium/Reading Food Labels: -Group verbal and written  material supporting the discussion of sodium use in heart healthy nutrition. Review and explanation with models, verbal and written materials for utilization of the food label.   Exercise Physiology & Risk Factors: - Group verbal and written instruction with models to review the exercise physiology of the cardiovascular system and associated critical values. Details cardiovascular disease risk factors and the goals associated with each risk factor.          Cardiac Rehab from 01/01/2016 in [facility] Cardiac and Pulmonary Rehab   Date  12/06/15   Educator  Dorisann Frames   Instruction Review Code  2- meets goals/outcomes      Aerobic Exercise & Resistance Training: - Gives group verbal and written discussion on the health impact of inactivity. On the components of aerobic and resistive training programs and the benefits of this training and how to safely progress through these programs.      Cardiac Rehab from 01/01/2016 in [facility] Cardiac and Pulmonary Rehab   Date  12/18/15   Educator  RM   Instruction Review Code  2- meets goals/outcomes      Flexibility, Balance, General Exercise Guidelines: - Provides group verbal and written instruction on the benefits of flexibility and balance training programs. Provides general exercise guidelines with specific guidelines to those with heart or lung disease. Demonstration and skill practice provided.      Cardiac Rehab from 01/01/2016 in [facility] Cardiac and Pulmonary Rehab   Date  12/18/15   Educator  RM   Instruction Review Code  2- meets goals/outcomes      Stress Management: - Provides group verbal and written instruction about the health risks of elevated stress, cause of high stress, and healthy ways to reduce stress.      Cardiac Rehab from 01/01/2016 in [facility] Cardiac and Pulmonary Rehab   Date  12/13/15   Educator  Kathreen Cornfield   Instruction Review Code  2- meets goals/outcomes      Depression: - Provides group verbal and written instruction on  the correlation between heart/lung disease and depressed mood, treatment options, and the stigmas associated with seeking treatment.   Anatomy & Physiology of the Heart: - Group verbal and written instruction and models provide basic cardiac anatomy and physiology, with the coronary electrical and arterial systems. Review of: AMI, Angina, Valve disease, Heart Failure, Cardiac Arrhythmia, Pacemakers, and the ICD.      Cardiac Rehab from 01/01/2016 in [facility] Cardiac and Pulmonary Rehab   Date  12/25/15   Educator  SB   Instruction Review Code  2- meets goals/outcomes      Cardiac Procedures: - Group verbal and written instruction and models to describe the testing methods done to diagnose heart disease. Reviews the outcomes of the test results. Describes the treatment choices: Medical Management, Angioplasty, or Coronary Bypass Surgery.   Cardiac Medications: - Group verbal and written instruction to review commonly prescribed medications for heart disease. Reviews the medication, class of the drug, and side effects. Includes the steps to properly store meds and maintain the prescription regimen.   Go Sex-Intimacy & Heart Disease, Get SMART - Goal Setting: -  Group verbal and written instruction through game format to discuss heart disease and the return to sexual intimacy. Provides group verbal and written material to discuss and apply goal setting through the application of the S.M.A.R.T. Method.   Other Matters of the Heart: - Provides group verbal, written materials and models to describe Heart Failure, Angina, Valve Disease, and Diabetes in the realm of heart disease. Includes description of the disease process and treatment options available to the cardiac patient.      Cardiac Rehab from 01/01/2016 in [facility] Cardiac and Pulmonary Rehab   Date  01/01/16   Educator  SB   Instruction Review Code  2- meets goals/outcomes      Exercise & Equipment Safety: - Individual verbal instruction  and demonstration of equipment use and safety with use of the equipment.      Cardiac Rehab from 01/01/2016 in [facility] Cardiac and Pulmonary Rehab   Date  11/29/15   Educator  DW   Instruction Review Code  1- partially meets, needs review/practice      Infection Prevention: - Provides verbal and written material to individual with discussion of infection control including proper hand washing and proper equipment cleaning during exercise session.      Cardiac Rehab from 01/01/2016 in [facility] Cardiac and Pulmonary Rehab   Date  11/29/15   Educator  DW   Instruction Review Code  2- meets goals/outcomes      Falls Prevention: - Provides verbal and written material to individual with discussion of falls prevention and safety.      Cardiac Rehab from 01/01/2016 in [facility] Cardiac and Pulmonary Rehab   Date  12/07/15   Educator  C. EnterkinRN   Instruction Review Code  2- meets goals/outcomes [Discussed when blood pressure was 70/40 to drink water first]      Diabetes: - Individual verbal and written instruction to review signs/symptoms of diabetes, desired ranges of glucose level fasting, after meals and with exercise. Advice that pre and post exercise glucose checks will be done for 3 sessions at entry of program.    Knowledge Questionnaire Score:     Knowledge Questionnaire Score - 11/29/15 1826    Knowledge Questionnaire Score   Pre Score 27//28      Personal Goals and Risk Factors at Admission:     Personal Goals and Risk Factors at Admission - 11/29/15 1836    Personal Goals and Risk Factors on Admission   Personal Goal Patient would like to be able to make her on bed and vacumn again.     Intervention Other  Start Cardiac Rehab and follow exercise prescription.  Patient's 8 week lifting restriction still in effect at this time.        Personal Goals and Risk Factors Review:      Goals and Risk Factor Review      12/07/15 1721 12/19/15 0639 12/25/15 1814 01/04/16 1648      Increase Aerobic Exercise and Physical Activity   Goals Progress/Improvement seen  Yes Yes      Comments Mercadez said "I will be here every time I am suppose to but I have never liked to exercise. I suggested for her to bring her Kindle to read a book while exercising on the Nustep.  Lorelie went shopping the other day and was able to walk to all the stores she wanted to go to. She did start to feel some SOB at the last store and she said this indicated she still has some  work to do with exercise to further build up her endurance. She is progressing well on all equipment and is very responsive to suggestions and challenges.  Staphany was able to walk up her neighbor's inclined driveway today after she walked her driveway. Then she went into [employer] to shop before arriving to [facility].  She stated that she was tired walking into the hosiptal after al the other activity today. She had no concerns nor complaints about the exercise session today.     Abnormal Lipids   Goal --  Sharan will follow up with her MD about her cholesterol. still taking statin.  Cholesterol controlled with medications as prescribed, with individualized exercise RX and with personalized nutrition plan. Value goals: LDL < 16m, HDL > 457m Participant states understanding of desired cholesterol values and following prescriptions.     Comments   Is aware of risk factor and the steps to take to help keep levels under control.     Other Goal   Goals Progress/Improvement seen     Met  NaTniyaanted to be able to make her bed and vacuum     Comments    NaKiannias met this goal.        Personal Goals Discharge (Final Personal Goals and Risk Factors Review):      Goals and Risk Factor Review - 01/04/16 1648    Other Goal   Goals Progress/Improvement seen  Met  NaIzora Galaanted to be able to make her bed and vacuum    Comments NaMarybelleas met this goal.       ITP Comments:     ITP Comments      12/03/15 1406 12/29/15 1255 01/04/16 1650 01/23/16  [id number]     ITP Comments Ready for 30 day review.  Continue with ITP   New to program, has attended orientation will start sessions soon. Ready for  30 day review.  Continue with ITP NaElexiusas set a new goal: She wants to go on a photography field trip . She wants to be able to carry a camera and also wear a backpack with another camera and equipment. And she wants to be able to keep up with the rest of the group.   Short term goals:  Get permission from MD  in the next month appointment to wear a backpack. Then start wwearing the backpack during exercie sessions to gain strength carrying the backpack. Longterm Goal: Ability to go on a one day field trip wiht camera and backpack. Building up to a five day fireld trip. 30 day review.  Continue [id number] [phone]       [id number]:

## 2016-01-23 NOTE — Progress Notes (Signed)
Cardiac Individual Treatment Plan  Patient Details  Name: [name] [name] MRN: [id number] Date of Birth: 1941/05/09 Referring Provider:  [name], MD  Initial Encounter Date:    Visit Diagnosis: S/P mitral valve repair  S/P placement of cardiac pacemaker  Patient's Home Medications on Admission:  Current outpatient prescriptions:  .  albuterol (PROVENTIL HFA;VENTOLIN HFA) 108 (90 BASE) MCG/ACT inhaler, Inhale 2 puffs into the lungs every 6 (six) hours as needed for wheezing or shortness of breath., Disp: 1 Inhaler, Rfl: 2 .  ALPRAZolam (XANAX) 0.25 MG tablet, Take 1 tablet (0.25 mg total) by mouth at bedtime as needed for anxiety or sleep., Disp: 30 tablet, Rfl: 5 .  amoxicillin (AMOXIL) 500 MG tablet, Take 4 tablets (2,000 mg total) by mouth once. Take 4 tablets 60 minutes prior to dental work, Disp: 30 tablet, Rfl: 1 .  azelastine (ASTELIN) 0.1 % nasal spray, Place 1 spray into both nostrils as needed. , Disp: , Rfl: 5 .  butalbital-acetaminophen-caffeine (FIORICET, ESGIC) 50-325-40 MG per tablet, TAKE 1 TABLET BY MOUTH AS NEEDED FOR BACK PAIN OR HEADACHE, Disp: 90 tablet, Rfl: 0 .  furosemide (LASIX) 40 MG tablet, Take 40 mg by mouth daily as needed for fluid., Disp: , Rfl:  .  metoprolol tartrate (LOPRESSOR) 25 MG tablet, Take 0.5 tablets (12.5 mg total) by mouth 2 (two) times daily., Disp: 90 tablet, Rfl: 3 .  montelukast (SINGULAIR) 10 MG tablet, Take 10 mg by mouth daily. , Disp: , Rfl:  .  rosuvastatin (CRESTOR) 5 MG tablet, Take 1 tablet (5 mg total) by mouth every other day., Disp: 45 tablet, Rfl: 3 .  traMADol (ULTRAM) 50 MG tablet, Take 1-2 tablets (50-100 mg total) by mouth every 4 (four) hours as needed for moderate pain., Disp: 30 tablet, Rfl: 0 .  warfarin (COUMADIN) 1 MG tablet, Take 1 tablet (1 mg total) by mouth daily., Disp: 90 tablet, Rfl: 3 .  warfarin (COUMADIN) 2 MG tablet, Take 1 tablet (2 mg total) by mouth as directed., Disp: 30 tablet, Rfl: 3  Past  Medical History: Past Medical History  Diagnosis Date  . SVT (supraventricular tachycardia) ([place])   . MVP (mitral valve prolapse)   . Hyperlipidemia   . Idiopathic thrombocytopenic purpura (ITP)   . Hemorrhoid   . IBS (irritable bowel syndrome)   . Migraine   . Severe mitral regurgitation   . RBBB   . Anxiety associated with depression     Prn alprazolam   . Thoracic aorta atherosclerosis ([place])   . Nodule of right lung   . Restrictive lung disease     Mild on PFT & likely cardiac in etiology   . 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 Atrial fibrillation, persistent ([place])     DCCV 08/22/2015  . Acute on chronic diastolic (congestive) heart failure ([place])   . 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 S/P Minimally invasive maze operation for atrial fibrillation 10/31/2015    Complete bilateral atrial lesion set using cryothermy and bipolar radiofrequency ablation with clipping of LA appendage via right mini thoracotomy approach  . Bradycardia post-op bradycardia, pacer dependent    MDT PPM 11/06/15, Dr. Lovena Le    Tobacco Use: History  Smoking status  . Never Smoker   Smokeless tobacco  . Never Used    Labs: Recent Review Flowsheet Data  Labs for ITP Cardiac and Pulmonary Rehab Latest Ref Rng 11/01/2015 11/01/2015 11/01/2015 11/01/2015 11/01/2015   PHART 7.350 - 7.450 7.391 7.391 - 7.226(L) -   PCO2ART 35.0 - 45.0 mmHg 38.8 37.9 - 59.5(HH) -   HCO3 20.0 - 24.0 mEq/L 23.6 23.1 - 24.7(H) -   TCO2 0 - 100 mmol/L '25 24 25 26 22   ' ACIDBASEDEF 0.0 - 2.0 mmol/L 1.0 2.0 - 4.0(H) -   O2SAT - 97.0 95.0 - 100.0 -       Exercise Target Goals:    Exercise Program Goal: Individual exercise prescription set with THRR, safety & activity barriers. Participant demonstrates ability to understand and report RPE using BORG scale, to  self-measure pulse accurately, and to acknowledge the importance of the exercise prescription.  Exercise Prescription Goal: Starting with aerobic activity 30 plus minutes a day, 3 days per week for initial exercise prescription. Provide home exercise prescription and guidelines that participant acknowledges understanding prior to discharge.  Activity Barriers & Risk Stratification:     Activity Barriers & Cardiac Risk Stratification - 11/29/15 1849    Activity Barriers & Cardiac Risk Stratification   Activity Barriers Incisional Pain;History of Falls;Arthritis;Back Problems;Other (comment)   Comments Incisional pain on right chest from MVR/s/p two chest tubes.  Patient is on an eight week lifting restriction of right arm through January 24th, 2017.        6 Minute Walk:     6 Minute Walk      11/29/15 1618       6 Minute Walk   Phase Initial     Distance 1275 feet     Walk Time 6 minutes     RPE 13     Symptoms No     Resting HR 88 bpm     Resting BP 118/74 mmHg     Max Ex. HR 113 bpm     Max Ex. BP 126/62 mmHg        Initial Exercise Prescription:     Initial Exercise Prescription - 11/29/15 1600    Date of Initial Exercise Prescription   Date 11/29/15   Treadmill   MPH 2   Grade 0   Minutes 10   Bike   Level 0.4   Minutes 10   Recumbant Bike   Level 3   RPM 40   Watts 25   Minutes 10   NuStep   Level 2   Watts 30   Minutes 15   Arm Ergometer   Level 1   Watts 8   Minutes 10   Arm/Foot Ergometer   Level 4   Watts 12   Minutes 10   Cybex   Level 1   RPM 50   Minutes 10   Recumbant Elliptical   Level 1   RPM 40   Watts 10   Minutes 10   Elliptical   Level 1   Speed 3   Minutes 1   REL-XR   Level 3   Watts 35   Minutes 15   T5 Nustep   Level 2   Watts 20   Minutes 15   Biostep-RELP   Level 3   Watts 20   Minutes 15   Prescription Details   Frequency (times per week) 3   Duration Progress to 30 minutes of continuous aerobic  without signs/symptoms of physical distress   Intensity   THRR REST +  30   Ratings of Perceived Exertion 11-15   Progression  Continue progressive overload as per policy without signs/symptoms or physical distress.   Resistance Training   Training Prescription Yes   Weight 2   Reps 10-15      Exercise Prescription Changes:     Exercise Prescription Changes      12/18/15 0636 01/03/16 1600 01/15/16 1600       Exercise Review   Progression Yes Yes Yes     Response to Exercise   Blood Pressure (Admit) 112/60 mmHg  118/64 mmHg     Blood Pressure (Exercise) 132/64 mmHg  122/72 mmHg     Blood Pressure (Exit) 94/56 mmHg  104/60 mmHg     Heart Rate (Admit) 94 bpm  92 bpm     Heart Rate (Exercise) 98 bpm  101 bpm     Heart Rate (Exit) 78 bpm  82 bpm     Rating of Perceived Exertion (Exercise) 13  13     Symptoms None None None     Comments Reviewed individualized exercise prescription and made increases per departmental policy. Exercise increases were discussed with the patient and they were able to perform the new work loads without issue (no signs or symptoms).  Increased Angela's walking speed. She used to walk at 3.0 mph and we are slowly working her back to that intensity. She can successfully execise for the entire 30-22mnutes of cardio.  Pt increased treadmill time with no adverse reactions noted.  Patient demonstrates good understanding of prescription change. Overall, patient is progressing well in program and works well independently. NCianiis doing more at home and walking on days she does not come to exercise. We will continue to follow up with NIzora Galaon her home exercise.      Frequency   Add 2 additional days to program exercise sessions.     Duration Progress to 30 minutes of continuous aerobic without signs/symptoms of physical distress Progress to 50 minutes of aerobic without signs/symptoms of physical distress Progress to 50 minutes of aerobic without signs/symptoms of  physical distress     Intensity Rest + 30 Rest + 30 Rest + 30     Progression Continue progressive overload as per policy without signs/symptoms or physical distress. Continue progressive overload as per policy without signs/symptoms or physical distress. Continue progressive overload as per policy without signs/symptoms or physical distress.     Resistance Training   Training Prescription Yes Yes Yes     Weight '2 2 2     ' Reps 10-15 10-15 10-15     Interval Training   Interval Training No No No     Treadmill   MPH 2.7 2.8 2.8     Grade 0 2 2     Minutes '20 20 25     ' REL-XR   Level '4 5 6     ' Watts 45 70 70     Minutes '15 15 15        ' Discharge Exercise Prescription (Final Exercise Prescription Changes):     Exercise Prescription Changes - 01/15/16 1600    Exercise Review   Progression Yes   Response to Exercise   Blood Pressure (Admit) 118/64 mmHg   Blood Pressure (Exercise) 122/72 mmHg   Blood Pressure (Exit) 104/60 mmHg   Heart Rate (Admit) 92 bpm   Heart Rate (Exercise) 101 bpm   Heart Rate (Exit) 82 bpm   Rating of Perceived Exertion (Exercise) 13   Symptoms None   Comments Pt increased treadmill time with no adverse reactions noted.  Patient demonstrates good understanding of prescription change. Overall, patient is progressing well in program and works well independently. Dwayne is doing more at home and walking on days she does not come to exercise. We will continue to follow up with Izora Gala on her home exercise.    Frequency Add 2 additional days to program exercise sessions.   Duration Progress to 50 minutes of aerobic without signs/symptoms of physical distress   Intensity Rest + 30   Progression Continue progressive overload as per policy without signs/symptoms or physical distress.   Resistance Training   Training Prescription Yes   Weight 2   Reps 10-15   Interval Training   Interval Training No   Treadmill   MPH 2.8   Grade 2   Minutes 25   REL-XR   Level  6   Watts 70   Minutes 15      Nutrition:  Target Goals: Understanding of nutrition guidelines, daily intake of sodium <1520m, cholesterol <2031m calories 30% from fat and 7% or less from saturated fats, daily to have 5 or more servings of fruits and vegetables.  Biometrics:     Pre Biometrics - 11/29/15 1617    Pre Biometrics   Height '5\' 5"'  (1.651 m)   Weight 140 lb 6.4 oz (63.685 kg)   Waist Circumference 32 inches   Hip Circumference 39.5 inches   Waist to Hip Ratio 0.81 %   BMI (Calculated) 23.4       Nutrition Therapy Plan and Nutrition Goals:     Nutrition Therapy & Goals - 12/20/15 1652    Nutrition Therapy   Diet NaKatreenavoids greenery foods due to the coumadin and she is doing well with this eventhough it is hard for her. She really enjoys green vegetables and wishes she could eat them.    Drug/Food Interactions Coumadin/Vit K   Personal Nutrition Goals   Personal Goal #1 NaBrindaas been maintaining a gluten free diet for 4 years and this is working very well for her   Personal Goal #2 NaKenieshaas been eating low sodium since 1999 and does very well with this.       Nutrition Discharge: Rate Your Plate Scores:   Nutrition Goals Re-Evaluation:     Nutrition Goals Re-Evaluation      12/07/15 1720           Personal Goal #1 Re-Evaluation   Personal Goal #1 NaIzora Galaaid she has cut out salt so she has not needed to really take her prn Lasix except for about 2 times.        Goal Progress Seen Yes          Psychosocial: Target Goals: Acknowledge presence or absence of depression, maximize coping skills, provide positive support system. Participant is able to verbalize types and ability to use techniques and skills needed for reducing stress and depression.  Initial Review & Psychosocial Screening:     Initial Psych Review & Screening - 11/29/15 [place]   Comments Pt has good support system, as she has one adult  son and adult daughter and 4 grand children.     Barriers   Psychosocial barriers to participate in program There are no identifiable barriers or psychosocial needs.   Screening Interventions   Interventions Encouraged to exercise;Program counselor consult      Quality of Life Scores:     Quality of Life - 11/30/15 1143  Quality of Life Scores   Health/Function Pre 19.73 %   Socioeconomic Pre 30 %   Psych/Spiritual Pre 23.79 %   Family Pre 27 %   GLOBAL Pre 23.7 %      PHQ-9:     Recent Review Flowsheet Data    Depression screen [facility] 2/9 11/29/2015 02/02/2015   Decreased Interest 0 0   Down, Depressed, Hopeless 0 0   PHQ - 2 Score 0 0   Altered sleeping 1 -   Tired, decreased energy 1 -   Change in appetite 0 -   Feeling bad or failure about yourself  0 -   Trouble concentrating 0 -   Moving slowly or fidgety/restless 0 -   Suicidal thoughts 0 -   PHQ-9 Score 2 -   Difficult doing work/chores Not difficult at all -      Psychosocial Evaluation and Intervention:     Psychosocial Evaluation - 12/13/15 1658    Psychosocial Evaluation & Interventions   Comments Counselor met with Ms. Pistilli (Ms. J) for initial psychosocial evaluation.  She is a 75 year old well-adjusted individual who had mitral valve replacement and a pacemaker over the past few months.  She is a widow who lives alone, but has family and friends close by as well as active involvement in her local church community.  Ms. Lenna Sciara states that she is sleeping better lately and her appetite is good.  She reports a history of anxiety with some recent symptoms.  She has been prescribed medication to take as needed for anxiety symptoms.  She reports a typically positive mood and has goals to return to her normal independent activities in the near future.  She states the most stressful thing for her currently is having to ask others for help.  Counselor will continue to follow with Ms. Lenna Sciara over the course of this program.      Continued Psychosocial Services Needed --  Counselor presented psychoeducation today on Stress management and Ms. J was an active participant in this class in order to learn and begin to practice new coping skills in her life to combat stress.        Psychosocial Re-Evaluation:     Psychosocial Re-Evaluation      12/07/15 1723 12/20/15 1619         Psychosocial Re-Evaluation   Interventions Encouraged to attend Cardiac Rehabilitation for the exercise       Comments Zyaire said "I will be here every time I am suppose to but I have never liked to exercise. I suggested for her to bring her Kindle to read a book while exercising on the Nustep. I suggested a book on tape but Chianna said she doesn't like anything in her ear. She will try her Kindle to pass time in Cardiac rehab. Georga said she was stressed today when she came to Cardiac Rehab the revolving door got stuck but the Valet guys fixed it.          Vocational Rehabilitation: Provide vocational rehab assistance to qualifying candidates.   Vocational Rehab Evaluation & Intervention:     Vocational Rehab - 11/29/15 1819    Initial Vocational Rehab Evaluation & Intervention   Assessment shows need for Vocational Rehabilitation No      Education: Education Goals: Education classes will be provided on a weekly basis, covering required topics. Participant will state understanding/return demonstration of topics presented.  Learning Barriers/Preferences:     Learning Barriers/Preferences - 11/29/15 1816  Learning Barriers/Preferences   Learning Barriers None   Learning Preferences None      Education Topics: General Nutrition Guidelines/Fats and Fiber: -Group instruction provided by verbal, written material, models and posters to present the general guidelines for heart healthy nutrition. Gives an explanation and review of dietary fats and fiber.   Controlling Sodium/Reading Food Labels: -Group verbal and written  material supporting the discussion of sodium use in heart healthy nutrition. Review and explanation with models, verbal and written materials for utilization of the food label.   Exercise Physiology & Risk Factors: - Group verbal and written instruction with models to review the exercise physiology of the cardiovascular system and associated critical values. Details cardiovascular disease risk factors and the goals associated with each risk factor.          Cardiac Rehab from 01/01/2016 in [facility] Cardiac and Pulmonary Rehab   Date  12/06/15   Educator  Dorisann Frames   Instruction Review Code  2- meets goals/outcomes      Aerobic Exercise & Resistance Training: - Gives group verbal and written discussion on the health impact of inactivity. On the components of aerobic and resistive training programs and the benefits of this training and how to safely progress through these programs.      Cardiac Rehab from 01/01/2016 in [facility] Cardiac and Pulmonary Rehab   Date  12/18/15   Educator  RM   Instruction Review Code  2- meets goals/outcomes      Flexibility, Balance, General Exercise Guidelines: - Provides group verbal and written instruction on the benefits of flexibility and balance training programs. Provides general exercise guidelines with specific guidelines to those with heart or lung disease. Demonstration and skill practice provided.      Cardiac Rehab from 01/01/2016 in [facility] Cardiac and Pulmonary Rehab   Date  12/18/15   Educator  RM   Instruction Review Code  2- meets goals/outcomes      Stress Management: - Provides group verbal and written instruction about the health risks of elevated stress, cause of high stress, and healthy ways to reduce stress.      Cardiac Rehab from 01/01/2016 in [facility] Cardiac and Pulmonary Rehab   Date  12/13/15   Educator  Kathreen Cornfield   Instruction Review Code  2- meets goals/outcomes      Depression: - Provides group verbal and written instruction on  the correlation between heart/lung disease and depressed mood, treatment options, and the stigmas associated with seeking treatment.   Anatomy & Physiology of the Heart: - Group verbal and written instruction and models provide basic cardiac anatomy and physiology, with the coronary electrical and arterial systems. Review of: AMI, Angina, Valve disease, Heart Failure, Cardiac Arrhythmia, Pacemakers, and the ICD.      Cardiac Rehab from 01/01/2016 in [facility] Cardiac and Pulmonary Rehab   Date  12/25/15   Educator  SB   Instruction Review Code  2- meets goals/outcomes      Cardiac Procedures: - Group verbal and written instruction and models to describe the testing methods done to diagnose heart disease. Reviews the outcomes of the test results. Describes the treatment choices: Medical Management, Angioplasty, or Coronary Bypass Surgery.   Cardiac Medications: - Group verbal and written instruction to review commonly prescribed medications for heart disease. Reviews the medication, class of the drug, and side effects. Includes the steps to properly store meds and maintain the prescription regimen.   Go Sex-Intimacy & Heart Disease, Get SMART - Goal Setting: -  Group verbal and written instruction through game format to discuss heart disease and the return to sexual intimacy. Provides group verbal and written material to discuss and apply goal setting through the application of the S.M.A.R.T. Method.   Other Matters of the Heart: - Provides group verbal, written materials and models to describe Heart Failure, Angina, Valve Disease, and Diabetes in the realm of heart disease. Includes description of the disease process and treatment options available to the cardiac patient.      Cardiac Rehab from 01/01/2016 in [facility] Cardiac and Pulmonary Rehab   Date  01/01/16   Educator  SB   Instruction Review Code  2- meets goals/outcomes      Exercise & Equipment Safety: - Individual verbal instruction  and demonstration of equipment use and safety with use of the equipment.      Cardiac Rehab from 01/01/2016 in [facility] Cardiac and Pulmonary Rehab   Date  11/29/15   Educator  DW   Instruction Review Code  1- partially meets, needs review/practice      Infection Prevention: - Provides verbal and written material to individual with discussion of infection control including proper hand washing and proper equipment cleaning during exercise session.      Cardiac Rehab from 01/01/2016 in [facility] Cardiac and Pulmonary Rehab   Date  11/29/15   Educator  DW   Instruction Review Code  2- meets goals/outcomes      Falls Prevention: - Provides verbal and written material to individual with discussion of falls prevention and safety.      Cardiac Rehab from 01/01/2016 in [facility] Cardiac and Pulmonary Rehab   Date  12/07/15   Educator  C. EnterkinRN   Instruction Review Code  2- meets goals/outcomes [Discussed when blood pressure was 70/40 to drink water first]      Diabetes: - Individual verbal and written instruction to review signs/symptoms of diabetes, desired ranges of glucose level fasting, after meals and with exercise. Advice that pre and post exercise glucose checks will be done for 3 sessions at entry of program.    Knowledge Questionnaire Score:     Knowledge Questionnaire Score - 11/29/15 1826    Knowledge Questionnaire Score   Pre Score 27//28      Personal Goals and Risk Factors at Admission:     Personal Goals and Risk Factors at Admission - 11/29/15 1836    Personal Goals and Risk Factors on Admission   Personal Goal Patient would like to be able to make her on bed and vacumn again.     Intervention Other  Start Cardiac Rehab and follow exercise prescription.  Patient's 8 week lifting restriction still in effect at this time.        Personal Goals and Risk Factors Review:      Goals and Risk Factor Review      12/07/15 1721 12/19/15 0639 12/25/15 1814 01/04/16 1648      Increase Aerobic Exercise and Physical Activity   Goals Progress/Improvement seen  Yes Yes      Comments Kiosha said "I will be here every time I am suppose to but I have never liked to exercise. I suggested for her to bring her Kindle to read a book while exercising on the Nustep.  Janaiya went shopping the other day and was able to walk to all the stores she wanted to go to. She did start to feel some SOB at the last store and she said this indicated she still has some  work to do with exercise to further build up her endurance. She is progressing well on all equipment and is very responsive to suggestions and challenges.  Clyda was able to walk up her neighbor's inclined driveway today after she walked her driveway. Then she went into [employer] to shop before arriving to [facility]  She stated that she was tired walking into the hosiptal after al the other activity today. She had no concerns nor complaints about the exercise session today.     Abnormal Lipids   Goal --  Satia will follow up with her MD about her cholesterol. still taking statin.  Cholesterol controlled with medications as prescribed, with individualized exercise RX and with personalized nutrition plan. Value goals: LDL < 57m, HDL > 411m Participant states understanding of desired cholesterol values and following prescriptions.     Comments   Is aware of risk factor and the steps to take to help keep levels under control.     Other Goal   Goals Progress/Improvement seen     Met  NaLorileeanted to be able to make her bed and vacuum     Comments    NaArdyceas met this goal.        Personal Goals Discharge (Final Personal Goals and Risk Factors Review):      Goals and Risk Factor Review - 01/04/16 1648    Other Goal   Goals Progress/Improvement seen  Met  NaIzora Galaanted to be able to make her bed and vacuum    Comments NaShundaas met this goal.       ITP Comments:     ITP Comments      12/03/15 1406 12/29/15 1255 01/04/16 1650 01/23/16  0709 01/23/16 0712   ITP Comments Ready for 30 day review.  Continue with ITP   New to program, has attended orientation will start sessions soon. Ready for  30 day review.  Continue with ITP NaBreanas set a new goal: She wants to go on a photography field trip . She wants to be able to carry a camera and also wear a backpack with another camera and equipment. And she wants to be able to keep up with the rest of the group.   Short term goals:  Get permission from MD  in the next month appointment to wear a backpack. Then start wwearing the backpack during exercie sessions to gain strength carrying the backpack. Longterm Goal: Ability to go on a one day field trip wiht camera and backpack. Building up to a five day fireld trip. 30 day review.  Continue with ITP 30 day review.  Continue with ITP  Continues to be absent. No calls to program      Comments:

## 2016-01-24 ENCOUNTER — Encounter: Payer: Medicare Other | Admitting: *Deleted

## 2016-01-24 DIAGNOSIS — Z950 Presence of cardiac pacemaker: Secondary | ICD-10-CM

## 2016-01-24 DIAGNOSIS — Z9889 Other specified postprocedural states: Secondary | ICD-10-CM

## 2016-01-24 NOTE — Progress Notes (Signed)
Daily Session Note  Patient Details  Name: VIKKIE GOEDEN MRN: [id number] Date of Birth: 05/15/1941 Referring Provider:  Thayer Headings, MD  Encounter Date: 01/24/2016  Check In:     Session Check In - 01/24/16 1630    Check-In   Location [facility]   Staff Present Gerlene Burdock, RN, Drusilla Kanner, MS, ACSM CEP, Exercise Physiologist;Kaysee Hergert Joya Gaskins, RN, BSN   Supervising physician immediately available to respond to emergencies See telemetry face sheet for immediately available ER MD   Medication changes reported     No   Fall or balance concerns reported    No   Warm-up and Cool-down Performed on first and last piece of equipment   Resistance Training Performed Yes   VAD Patient? No   Pain Assessment   Currently in Pain? No/denies         Goals Met:  Independence with exercise equipment Exercise tolerated well No report of cardiac concerns or symptoms Strength training completed today  Goals Unmet:  Not Applicable  Goals Comments:  Patient completed exercise prescription and all exercise goals during rehab session. The exercise was tolerated well and the patient is progressing in the program.    Dr. Emily Filbert is Medical Director for [place] and [facility].

## 2016-01-25 DIAGNOSIS — Z950 Presence of cardiac pacemaker: Secondary | ICD-10-CM

## 2016-01-25 DIAGNOSIS — Z9889 Other specified postprocedural states: Secondary | ICD-10-CM

## 2016-01-25 NOTE — Progress Notes (Signed)
Daily Session Note  Patient Details  Name: [name] [name] MRN: [id number] Date of Birth: [birth year] Referring Provider:  Thayer Headings, MD  Encounter Date: 01/25/2016  Check In:     Session Check In - 01/25/16 1615    Check-In   Location [facility]   Staff Present Heath Lark, RN, BSN, CCRP;Carroll Enterkin, RN, [employer], BS, ACSM EP-C, Exercise Physiologist   Supervising physician immediately available to respond to emergencies See telemetry face sheet for immediately available ER MD   Medication changes reported     No   Fall or balance concerns reported    No   Warm-up and Cool-down Performed on first and last piece of equipment   Resistance Training Performed No   VAD Patient? No   Pain Assessment   Currently in Pain? No/denies         Goals Met:  Proper associated with RPD/PD & O2 Sat Exercise tolerated well No report of cardiac concerns or symptoms Strength training completed today  Goals Unmet:  Not Applicable  Goals Comments:    Dr. Emily Filbert is Medical Director for [place] and [facility].

## 2016-01-29 DIAGNOSIS — Z9889 Other specified postprocedural states: Secondary | ICD-10-CM | POA: Diagnosis not present

## 2016-01-29 NOTE — Progress Notes (Signed)
Daily Session Note  Patient Details  Name: [name] [name] MRN: [id number] Date of Birth: 11/01/1941 Referring Provider:  Nahser, Philip J, MD  Encounter Date: 01/29/2016  Check In:     Session Check In - 01/29/16 1633    Check-In   Location [facility]   Staff Present Susanne Bice, RN, BSN, CCRP;Steven Way, BS, ACSM EP-C, Exercise Physiologist; , PT   Supervising physician immediately available to respond to emergencies See telemetry face sheet for immediately available ER MD   Medication changes reported     No   Fall or balance concerns reported    No   Warm-up and Cool-down Performed on first and last piece of equipment   Resistance Training Performed Yes   VAD Patient? No   Pain Assessment   Currently in Pain? No/denies         Goals Met:  Independence with exercise equipment Exercise tolerated well No report of cardiac concerns or symptoms  Goals Unmet:  Not Applicable  Comments: Patient completed exercise prescription and all exercise goals during rehab session. The exercise was tolerated well and the patient is progressing in the program.    Dr. Mark Miller is Medical Director for [facility] and [facility]. 

## 2016-01-31 ENCOUNTER — Encounter: Payer: Medicare Other | Attending: Cardiovascular Disease | Admitting: *Deleted

## 2016-01-31 ENCOUNTER — Ambulatory Visit (INDEPENDENT_AMBULATORY_CARE_PROVIDER_SITE_OTHER): Payer: Medicare Other

## 2016-01-31 DIAGNOSIS — Z8679 Personal history of other diseases of the circulatory system: Secondary | ICD-10-CM

## 2016-01-31 DIAGNOSIS — I4891 Unspecified atrial fibrillation: Secondary | ICD-10-CM | POA: Diagnosis not present

## 2016-01-31 DIAGNOSIS — Z953 Presence of xenogenic heart valve: Secondary | ICD-10-CM

## 2016-01-31 DIAGNOSIS — Z9889 Other specified postprocedural states: Secondary | ICD-10-CM

## 2016-01-31 DIAGNOSIS — Z950 Presence of cardiac pacemaker: Secondary | ICD-10-CM

## 2016-01-31 LAB — POCT INR: INR: 1.9

## 2016-01-31 NOTE — Progress Notes (Signed)
Daily Session Note  Patient Details  Name: [name] [name] MRN: [id number] Date of Birth: Jun 09, 1941 Referring Provider:  Thayer Headings, MD  Encounter Date: 01/31/2016  Check In:     Session Check In - 01/31/16 1617    Check-In   Location [facility]   Staff Present Nyoka Cowden, RN;Rebecca Sickles, PT;Terrisha Lopata Joya Gaskins, RN, BSN   Supervising physician immediately available to respond to emergencies See telemetry face sheet for immediately available ER MD   Medication changes reported     No   Fall or balance concerns reported    No   Warm-up and Cool-down Performed on first and last piece of equipment   Resistance Training Performed Yes   VAD Patient? No   Pain Assessment   Currently in Pain? No/denies           Exercise Prescription Changes - 01/31/16 1600    Exercise Review   Progression Yes   Response to Exercise   Blood Pressure (Admit) 118/64 mmHg   Blood Pressure (Exercise) --   Blood Pressure (Exit) --   Heart Rate (Admit) --   Heart Rate (Exercise) --   Heart Rate (Exit) --   Rating of Perceived Exertion (Exercise) --   Symptoms --   Comments --   Frequency --   Duration Progress to 50 minutes of aerobic without signs/symptoms of physical distress   Intensity Rest + 30   Progression   Progression Continue progressive overload as per policy without signs/symptoms or physical distress.   Interval Training   Interval Training No   Treadmill   MPH 3   Grade 2   Minutes 20      Goals Met:  Independence with exercise equipment Exercise tolerated well Personal goals reviewed No report of cardiac concerns or symptoms Strength training completed today  Goals Unmet:  Not Applicable  Comments:   Patient completed exercise prescription and all exercise goals during rehab session. The exercise was tolerated well and the patient is progressing in the program.    Dr. Emily Filbert is Medical Director for [place] and [facility].

## 2016-01-31 NOTE — Addendum Note (Signed)
Addended by: Lynford Humphrey on: 01/31/2016 11:02 AM   Modules accepted: Orders

## 2016-02-01 DIAGNOSIS — Z9889 Other specified postprocedural states: Secondary | ICD-10-CM | POA: Diagnosis not present

## 2016-02-01 DIAGNOSIS — Z950 Presence of cardiac pacemaker: Secondary | ICD-10-CM

## 2016-02-01 NOTE — Progress Notes (Signed)
Daily Session Note  Patient Details  Name: [name] [name] MRN: [id number] Date of Birth: 1941-03-18 Referring Provider:  Thayer Headings, MD  Encounter Date: 02/01/2016  Check In:     Session Check In - 02/01/16 1601    Check-In   Location [facility]   Staff Present Gerlene Burdock, RN, BSN;Tin Engram, BS, ACSM EP-C, Exercise Physiologist;Diane Joya Gaskins, RN, BSN   Supervising physician immediately available to respond to emergencies See telemetry face sheet for immediately available ER MD   Medication changes reported     No   Fall or balance concerns reported    No   Warm-up and Cool-down Performed on first and last piece of equipment   Resistance Training Performed No   VAD Patient? No   Pain Assessment   Currently in Pain? No/denies         Goals Met:  Proper associated with RPD/PD & O2 Sat Exercise tolerated well No report of cardiac concerns or symptoms Strength training completed today  Goals Unmet:  Not Applicable  Comments:    Dr. Emily Filbert is Medical Director for [place] and [facility].

## 2016-02-01 NOTE — Progress Notes (Signed)
Cardiac Individual Treatment Plan  Patient Details  Name: [name] [name] MRN: [id number] Date of Birth: Apr 09, 1941 Referring Provider:  Thayer Headings, MD  Initial Encounter Date:    Visit Diagnosis: S/P mitral valve repair  S/P placement of cardiac pacemaker  Patient's Home Medications on Admission:  Current outpatient prescriptions:  .  albuterol (PROVENTIL HFA;VENTOLIN HFA) 108 (90 BASE) MCG/ACT inhaler, Inhale 2 puffs into the lungs every 6 (six) hours as needed for wheezing or shortness of breath., Disp: 1 Inhaler, Rfl: 2 .  ALPRAZolam (XANAX) 0.25 MG tablet, Take 1 tablet (0.25 mg total) by mouth at bedtime as needed for anxiety or sleep., Disp: 30 tablet, Rfl: 5 .  amoxicillin (AMOXIL) 500 MG tablet, Take 4 tablets (2,000 mg total) by mouth once. Take 4 tablets 60 minutes prior to dental work, Disp: 30 tablet, Rfl: 1 .  azelastine (ASTELIN) 0.1 % nasal spray, Place 1 spray into both nostrils as needed. , Disp: , Rfl: 5 .  butalbital-acetaminophen-caffeine (FIORICET, ESGIC) 50-325-40 MG per tablet, TAKE 1 TABLET BY MOUTH AS NEEDED FOR BACK PAIN OR HEADACHE, Disp: 90 tablet, Rfl: 0 .  furosemide (LASIX) 40 MG tablet, Take 40 mg by mouth daily as needed for fluid., Disp: , Rfl:  .  metoprolol tartrate (LOPRESSOR) 25 MG tablet, Take 0.5 tablets (12.5 mg total) by mouth 2 (two) times daily., Disp: 90 tablet, Rfl: 3 .  montelukast (SINGULAIR) 10 MG tablet, Take 10 mg by mouth daily. , Disp: , Rfl:  .  rosuvastatin (CRESTOR) 5 MG tablet, Take 1 tablet (5 mg total) by mouth every other day., Disp: 45 tablet, Rfl: 3 .  traMADol (ULTRAM) 50 MG tablet, Take 1-2 tablets (50-100 mg total) by mouth every 4 (four) hours as needed for moderate pain., Disp: 30 tablet, Rfl: 0 .  warfarin (COUMADIN) 1 MG tablet, Take 1 tablet (1 mg total) by mouth daily., Disp: 90 tablet, Rfl: 3 .  warfarin (COUMADIN) 2 MG tablet, Take 1 tablet (2 mg total) by mouth as directed., Disp: 30 tablet, Rfl: 3  Past  Medical History: Past Medical History  Diagnosis Date  . SVT (supraventricular tachycardia) ([place])   . MVP (mitral valve prolapse)   . Hyperlipidemia   . Idiopathic thrombocytopenic purpura (ITP)   . Hemorrhoid   . IBS (irritable bowel syndrome)   . Migraine   . Severe mitral regurgitation   . RBBB   . Anxiety associated with depression     Prn alprazolam   . Thoracic aorta atherosclerosis ([place])   . Nodule of right lung   . Restrictive lung disease     Mild on PFT & likely cardiac in etiology   . 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 Atrial fibrillation, persistent ([place])     DCCV 08/22/2015  . Acute on chronic diastolic (congestive) heart failure ([place])   . 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 S/P Minimally invasive maze operation for atrial fibrillation 10/31/2015    Complete bilateral atrial lesion set using cryothermy and bipolar radiofrequency ablation with clipping of LA appendage via right mini thoracotomy approach  . Bradycardia post-op bradycardia, pacer dependent    MDT PPM 11/06/15, Dr. Lovena Le    Tobacco Use: History  Smoking status  . Never Smoker   Smokeless tobacco  . Never Used    Labs: Recent Review Flowsheet Data  Labs for ITP Cardiac and Pulmonary Rehab Latest Ref Rng 11/01/2015 11/01/2015 11/01/2015 11/01/2015 11/01/2015   PHART 7.350 - 7.450 7.391 7.391 - 7.226(L) -   PCO2ART 35.0 - 45.0 mmHg 38.8 37.9 - 59.5(HH) -   HCO3 20.0 - 24.0 mEq/L 23.6 23.1 - 24.7(H) -   TCO2 0 - 100 mmol/L '25 24 25 26 22   ' ACIDBASEDEF 0.0 - 2.0 mmol/L 1.0 2.0 - 4.0(H) -   O2SAT - 97.0 95.0 - 100.0 -       Exercise Target Goals:    Exercise Program Goal: Individual exercise prescription set with THRR, safety & activity barriers. Participant demonstrates ability to understand and report RPE using BORG scale, to  self-measure pulse accurately, and to acknowledge the importance of the exercise prescription.  Exercise Prescription Goal: Starting with aerobic activity 30 plus minutes a day, 3 days per week for initial exercise prescription. Provide home exercise prescription and guidelines that participant acknowledges understanding prior to discharge.  Activity Barriers & Risk Stratification:     Activity Barriers & Cardiac Risk Stratification - 11/29/15 1849    Activity Barriers & Cardiac Risk Stratification   Activity Barriers Incisional Pain;History of Falls;Arthritis;Back Problems;Other (comment)   Comments Incisional pain on right chest from MVR/s/p two chest tubes.  Patient is on an eight week lifting restriction of right arm through January 24th, 2017.        6 Minute Walk:     6 Minute Walk      11/29/15 1618       6 Minute Walk   Phase Initial     Distance 1275 feet     Walk Time 6 minutes     RPE 13     Symptoms No     Resting HR 88 bpm     Resting BP 118/74 mmHg     Max Ex. HR 113 bpm     Max Ex. BP 126/62 mmHg        Initial Exercise Prescription:     Initial Exercise Prescription - 11/29/15 1600    Date of Initial Exercise Prescription   Date 11/29/15   Treadmill   MPH 2   Grade 0   Minutes 10   Bike   Level 0.4   Minutes 10   Recumbant Bike   Level 3   RPM 40   Watts 25   Minutes 10   NuStep   Level 2   Watts 30   Minutes 15   Arm Ergometer   Level 1   Watts 8   Minutes 10   Arm/Foot Ergometer   Level 4   Watts 12   Minutes 10   Cybex   Level 1   RPM 50   Minutes 10   Recumbant Elliptical   Level 1   RPM 40   Watts 10   Minutes 10   Elliptical   Level 1   Speed 3   Minutes 1   REL-XR   Level 3   Watts 35   Minutes 15   T5 Nustep   Level 2   Watts 20   Minutes 15   Biostep-RELP   Level 3   Watts 20   Minutes 15   Prescription Details   Frequency (times per week) 3   Duration Progress to 30 minutes of continuous aerobic  without signs/symptoms of physical distress   Intensity   THRR REST +  30   Ratings of Perceived Exertion 11-15   Progression  Progression Continue progressive overload as per policy without signs/symptoms or physical distress.   Resistance Training   Training Prescription Yes   Weight 2   Reps 10-15      Exercise Prescription Changes:     Exercise Prescription Changes      12/18/15 0636 01/03/16 1600 01/15/16 1600 01/31/16 1600     Exercise Review   Progression Yes Yes Yes Yes    Response to Exercise   Blood Pressure (Admit) 112/60 mmHg  118/64 mmHg 118/64 mmHg    Blood Pressure (Exercise) 132/64 mmHg  122/72 mmHg --    Blood Pressure (Exit) 94/56 mmHg  104/60 mmHg --    Heart Rate (Admit) 94 bpm  92 bpm --    Heart Rate (Exercise) 98 bpm  101 bpm --    Heart Rate (Exit) 78 bpm  82 bpm --    Rating of Perceived Exertion (Exercise) 13  13 --    Symptoms None None None --    Comments Reviewed individualized exercise prescription and made increases per departmental policy. Exercise increases were discussed with the patient and they were able to perform the new work loads without issue (no signs or symptoms).  Increased Annia's walking speed. She used to walk at 3.0 mph and we are slowly working her back to that intensity. She can successfully execise for the entire 30-47mnutes of cardio.  Pt increased treadmill time with no adverse reactions noted.  Patient demonstrates good understanding of prescription change. Overall, patient is progressing well in program and works well independently. NSumireis doing more at home and walking on days she does not come to exercise. We will continue to follow up with NIzora Galaon her home exercise.  --    Frequency   Add 2 additional days to program exercise sessions. --    Duration Progress to 30 minutes of continuous aerobic without signs/symptoms of physical distress Progress to 50 minutes of aerobic without signs/symptoms of physical distress Progress  to 50 minutes of aerobic without signs/symptoms of physical distress Progress to 50 minutes of aerobic without signs/symptoms of physical distress    Intensity Rest + 30 Rest + 30 Rest + 30 Rest + 30    Progression   Progression Continue progressive overload as per policy without signs/symptoms or physical distress. Continue progressive overload as per policy without signs/symptoms or physical distress. Continue progressive overload as per policy without signs/symptoms or physical distress. Continue progressive overload as per policy without signs/symptoms or physical distress.    Resistance Training   Training Prescription (read-only) Yes Yes Yes     Weight (read-only) '2 2 2     ' Reps (read-only) 10-15 10-15 10-15     Interval Training   Interval Training No No No No    Treadmill   MPH    3    Grade    2    Minutes    20    MPH (read-only) 2.7 2.8 2.8     Grade (read-only) 0 2 2     Minutes (read-only) '20 20 25     ' REL-XR   Level (read-only) '4 5 6     ' Watts (read-only) 45 70 70     Minutes (read-only) '15 15 15        ' Discharge Exercise Prescription (Final Exercise Prescription Changes):     Exercise Prescription Changes - 01/31/16 1600    Exercise Review   Progression Yes   Response to Exercise   Blood Pressure (Admit) 118/64  mmHg   Blood Pressure (Exercise) --   Blood Pressure (Exit) --   Heart Rate (Admit) --   Heart Rate (Exercise) --   Heart Rate (Exit) --   Rating of Perceived Exertion (Exercise) --   Symptoms --   Comments --   Frequency --   Duration Progress to 50 minutes of aerobic without signs/symptoms of physical distress   Intensity Rest + 30   Progression   Progression Continue progressive overload as per policy without signs/symptoms or physical distress.   Interval Training   Interval Training No   Treadmill   MPH 3   Grade 2   Minutes 20      Nutrition:  Target Goals: Understanding of nutrition guidelines, daily intake of sodium <155m,  cholesterol <2020m calories 30% from fat and 7% or less from saturated fats, daily to have 5 or more servings of fruits and vegetables.  Biometrics:     Pre Biometrics - 11/29/15 1617    Pre Biometrics   Height '5\' 5"'  (1.651 m)   Weight 140 lb 6.4 oz (63.685 kg)   Waist Circumference 32 inches   Hip Circumference 39.5 inches   Waist to Hip Ratio 0.81 %   BMI (Calculated) 23.4       Nutrition Therapy Plan and Nutrition Goals:     Nutrition Therapy & Goals - 12/20/15 1652    Nutrition Therapy   Diet NaVallorievoids greenery foods due to the coumadin and she is doing well with this eventhough it is hard for her. She really enjoys green vegetables and wishes she could eat them.    Drug/Food Interactions Coumadin/Vit K   Personal Nutrition Goals   Personal Goal #1 NaZenas been maintaining a gluten free diet for 4 years and this is working very well for her   Personal Goal #2 NaMakedaas been eating low sodium since 1999 and does very well with this.       Nutrition Discharge: Rate Your Plate Scores:   Nutrition Goals Re-Evaluation:     Nutrition Goals Re-Evaluation      12/07/15 1720 02/01/16 1634         Personal Goal #1 Re-Evaluation   Personal Goal #1 NaIzora Galaaid she has cut out salt so she has not needed to really take her prn Lasix except for about 2 times.        Goal Progress Seen Yes       Personal Goal #2 Re-Evaluation   Personal Goal #2  NaNaveyahaid she and her husband have eaten low sodium but it is very tough when she is traveling and has to eat out.       Goal Progress Seen  Yes         Psychosocial: Target Goals: Acknowledge presence or absence of depression, maximize coping skills, provide positive support system. Participant is able to verbalize types and ability to use techniques and skills needed for reducing stress and depression.  Initial Review & Psychosocial Screening:     Initial Psych Review & Screening - 11/29/15 [place]   Comments Pt has good support system, as she has one adult son and adult daughter and 4 grand children.     Barriers   Psychosocial barriers to participate in program There are no identifiable barriers or psychosocial needs.   Screening Interventions   Interventions Encouraged to exercise;Program counselor consult      Quality of Life  Scores:     Quality of Life - 11/30/15 1143    Quality of Life Scores   Health/Function Pre 19.73 %   Socioeconomic Pre 30 %   Psych/Spiritual Pre 23.79 %   Family Pre 27 %   GLOBAL Pre 23.7 %      PHQ-9:     Recent Review Flowsheet Data    Depression screen [facility] 2/9 11/29/2015 02/02/2015   Decreased Interest 0 0   Down, Depressed, Hopeless 0 0   PHQ - 2 Score 0 0   Altered sleeping 1 -   Tired, decreased energy 1 -   Change in appetite 0 -   Feeling bad or failure about yourself  0 -   Trouble concentrating 0 -   Moving slowly or fidgety/restless 0 -   Suicidal thoughts 0 -   PHQ-9 Score 2 -   Difficult doing work/chores Not difficult at all -      Psychosocial Evaluation and Intervention:     Psychosocial Evaluation - 12/13/15 1658    Psychosocial Evaluation & Interventions   Comments Counselor met with Ms. Speas (Ms. J) for initial psychosocial evaluation.  She is a 75 year old well-adjusted individual who had mitral valve replacement and a pacemaker over the past few months.  She is a widow who lives alone, but has family and friends close by as well as active involvement in her local church community.  Ms. Lenna Sciara states that she is sleeping better lately and her appetite is good.  She reports a history of anxiety with some recent symptoms.  She has been prescribed medication to take as needed for anxiety symptoms.  She reports a typically positive mood and has goals to return to her normal independent activities in the near future.  She states the most stressful thing for her currently is having to ask others for help.   Counselor will continue to follow with Ms. Lenna Sciara over the course of this program.     Continued Psychosocial Services Needed --  Counselor presented psychoeducation today on Stress management and Ms. J was an active participant in this class in order to learn and begin to practice new coping skills in her life to combat stress.        Psychosocial Re-Evaluation:     Psychosocial Re-Evaluation      12/07/15 1723 12/20/15 1619 02/01/16 1636       Psychosocial Re-Evaluation   Interventions Encouraged to attend Cardiac Rehabilitation for the exercise  Encouraged to attend Cardiac Rehabilitation for the exercise     Comments Lemya said "I will be here every time I am suppose to but I have never liked to exercise. I suggested for her to bring her Kindle to read a book while exercising on the Nustep. I suggested a book on tape but Zabria said she doesn't like anything in her ear. She will try her Kindle to pass time in Cardiac rehab. Jenie said she was stressed today when she came to Cardiac Rehab the revolving door got stuck but the Valet guys fixed it.  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 She hopes to go to [place] with her camera/photography club to take pictures so is wearing her 8.9 lb camera backpack today since she has missed those trips.         Vocational Rehabilitation: Provide vocational rehab assistance to qualifying candidates.   Vocational Rehab  Evaluation & Intervention:     Vocational Rehab - 11/29/15 1819    Initial Vocational Rehab Evaluation & Intervention   Assessment shows need for Vocational Rehabilitation No      Education: Education Goals: Education classes will be provided on a weekly basis, covering required topics. Participant will state understanding/return demonstration of topics presented.  Learning Barriers/Preferences:     Learning Barriers/Preferences - 11/29/15  1816    Learning Barriers/Preferences   Learning Barriers None   Learning Preferences None      Education Topics: General Nutrition Guidelines/Fats and Fiber: -Group instruction provided by verbal, written material, models and posters to present the general guidelines for heart healthy nutrition. Gives an explanation and review of dietary fats and fiber.   Controlling Sodium/Reading Food Labels: -Group verbal and written material supporting the discussion of sodium use in heart healthy nutrition. Review and explanation with models, verbal and written materials for utilization of the food label.   Exercise Physiology & Risk Factors: - Group verbal and written instruction with models to review the exercise physiology of the cardiovascular system and associated critical values. Details cardiovascular disease risk factors and the goals associated with each risk factor.          Cardiac Rehab from 01/31/2016 in [facility] Cardiac and Pulmonary Rehab   Date  01/29/16   Educator  sw   Instruction Review Code  2- meets goals/outcomes      Aerobic Exercise & Resistance Training: - Gives group verbal and written discussion on the health impact of inactivity. On the components of aerobic and resistive training programs and the benefits of this training and how to safely progress through these programs.      Cardiac Rehab from 01/31/2016 in [facility] Cardiac and Pulmonary Rehab   Date  01/31/16   Educator  BS   Instruction Review Code  2- meets goals/outcomes      Flexibility, Balance, General Exercise Guidelines: - Provides group verbal and written instruction on the benefits of flexibility and balance training programs. Provides general exercise guidelines with specific guidelines to those with heart or lung disease. Demonstration and skill practice provided.      Cardiac Rehab from 01/31/2016 in [facility] Cardiac and Pulmonary Rehab   Date  12/18/15   Educator  RM   Instruction Review Code  2- meets  goals/outcomes      Stress Management: - Provides group verbal and written instruction about the health risks of elevated stress, cause of high stress, and healthy ways to reduce stress.      Cardiac Rehab from 01/31/2016 in [facility] Cardiac and Pulmonary Rehab   Date  12/13/15   Educator  Kathreen Cornfield   Instruction Review Code  2- meets goals/outcomes      Depression: - Provides group verbal and written instruction on the correlation between heart/lung disease and depressed mood, treatment options, and the stigmas associated with seeking treatment.   Anatomy & Physiology of the Heart: - Group verbal and written instruction and models provide basic cardiac anatomy and physiology, with the coronary electrical and arterial systems. Review of: AMI, Angina, Valve disease, Heart Failure, Cardiac Arrhythmia, Pacemakers, and the ICD.      Cardiac Rehab from 01/31/2016 in [facility] Cardiac and Pulmonary Rehab   Date  12/25/15   Educator  SB   Instruction Review Code  2- meets goals/outcomes      Cardiac Procedures: - Group verbal and written instruction and models to describe the testing methods done to diagnose heart  disease. Reviews the outcomes of the test results. Describes the treatment choices: Medical Management, Angioplasty, or Coronary Bypass Surgery.   Cardiac Medications: - Group verbal and written instruction to review commonly prescribed medications for heart disease. Reviews the medication, class of the drug, and side effects. Includes the steps to properly store meds and maintain the prescription regimen.   Go Sex-Intimacy & Heart Disease, Get SMART - Goal Setting: - Group verbal and written instruction through game format to discuss heart disease and the return to sexual intimacy. Provides group verbal and written material to discuss and apply goal setting through the application of the S.M.A.R.T. Method.   Other Matters of the Heart: - Provides group verbal, written materials and  models to describe Heart Failure, Angina, Valve Disease, and Diabetes in the realm of heart disease. Includes description of the disease process and treatment options available to the cardiac patient.      Cardiac Rehab from 01/31/2016 in [facility] Cardiac and Pulmonary Rehab   Date  01/01/16   Educator  SB   Instruction Review Code  2- meets goals/outcomes      Exercise & Equipment Safety: - Individual verbal instruction and demonstration of equipment use and safety with use of the equipment.      Cardiac Rehab from 01/31/2016 in [facility] Cardiac and Pulmonary Rehab   Date  11/29/15   Educator  DW   Instruction Review Code  1- partially meets, needs review/practice      Infection Prevention: - Provides verbal and written material to individual with discussion of infection control including proper hand washing and proper equipment cleaning during exercise session.      Cardiac Rehab from 01/31/2016 in [facility] Cardiac and Pulmonary Rehab   Date  11/29/15   Educator  DW   Instruction Review Code  2- meets goals/outcomes      Falls Prevention: - Provides verbal and written material to individual with discussion of falls prevention and safety.      Cardiac Rehab from 01/31/2016 in [facility] Cardiac and Pulmonary Rehab   Date  12/07/15   Educator  C. EnterkinRN   Instruction Review Code  2- meets goals/outcomes [Discussed when blood pressure was 70/40 to drink water first]      Diabetes: - Individual verbal and written instruction to review signs/symptoms of diabetes, desired ranges of glucose level fasting, after meals and with exercise. Advice that pre and post exercise glucose checks will be done for 3 sessions at entry of program.    Knowledge Questionnaire Score:     Knowledge Questionnaire Score - 11/29/15 1826    Knowledge Questionnaire Score   Pre Score 27//28      Personal Goals and Risk Factors at Admission:     Personal Goals and Risk Factors at Admission - 11/29/15 1836    Core  Components/Risk Factors/Patient Goals on Admission   Personal Goal Patient would like to be able to make her on bed and vacumn again.     Intervention Start Cardiac Rehab and follow exercise prescription.  Patient's 8 week lifting restriction still in effect at this time.        Personal Goals and Risk Factors Review:      Goals and Risk Factor Review      12/07/15 1721 12/19/15 0639 12/25/15 1814 01/04/16 1648 02/01/16 1634   Core Components/Risk Factors/Patient Goals Review   Personal Goals Review Increase Aerobic Exercise and Physical Activity  Increase Aerobic Exercise and Physical Activity;Lipids  Lipids   Review  Isella said her lipid panel blood work was about perfect when she was taking Crestor every day but she was waking up about 3 times a night with leg cramps so MD changed her med to every other day.    Increase Aerobic Exercise and Physical Activity (read-only)   Goals Progress/Improvement seen  Yes Yes      Comments Joslyn said "I will be here every time I am suppose to but I have never liked to exercise. I suggested for her to bring her Kindle to read a book while exercising on the Nustep.  Shaiann went shopping the other day and was able to walk to all the stores she wanted to go to. She did start to feel some SOB at the last store and she said this indicated she still has some work to do with exercise to further build up her endurance. She is progressing well on all equipment and is very responsive to suggestions and challenges.  Amnah was able to walk up her neighbor's inclined driveway today after she walked her driveway. Then she went into [employer] to shop before arriving to [facility].  She stated that she was tired walking into the hosiptal after al the other activity today. She had no concerns nor complaints about the exercise session today.     Abnormal Lipids (read-only)   Goal --  Emmanuela will follow up with her MD about her cholesterol. still taking statin.  Cholesterol controlled  with medications as prescribed, with individualized exercise RX and with personalized nutrition plan. Value goals: LDL < 43m, HDL > 445m Participant states understanding of desired cholesterol values and following prescriptions.     Comments   Is aware of risk factor and the steps to take to help keep levels under control.     Other Goal (read-only)   Goals Progress/Improvement seen     Met  NaKalaynaanted to be able to make her bed and vacuum     Comments    NaKrystnas met this goal.        Personal Goals Discharge (Final Personal Goals and Risk Factors Review):      Goals and Risk Factor Review - 02/01/16 1634    Core Components/Risk Factors/Patient Goals Review   Personal Goals Review Lipids   Review NaShontellaid her lipid panel blood work was about perfect when she was taking Crestor every day but she was waking up about 3 times a night with leg cramps so MD changed her med to every other day.       ITP Comments:     ITP Comments      12/03/15 1406 12/29/15 1255 01/04/16 1650 01/23/16 0709 01/23/16 0712   ITP Comments Ready for 30 day review.  Continue with ITP   New to program, has attended orientation will start sessions soon. Ready for  30 day review.  Continue with ITP NaOndineas set a new goal: She wants to go on a photography field trip . She wants to be able to carry a camera and also wear a backpack with another camera and equipment. And she wants to be able to keep up with the rest of the group.   Short term goals:  Get permission from MD  in the next month appointment to wear a backpack. Then start wwearing the backpack during exercie sessions to gain strength carrying the backpack. Longterm Goal: Ability to go on a one day field trip wiht camera and backpack. Building up to a  five day fireld trip. 30 day review.  Continue with ITP 30 day review.  Continue with ITP  Continues to be absent. No calls to program     02/01/16 1633           ITP Comments Miciah is wearing her 8.9lb  camera backpack since her goal is to return to take photography trips. Cameshia reports she had to cancel a photography trip last year but hopes to take a trip to [place]. She knows to stop eveyr 2 hours when she drives.           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

## 2016-02-05 ENCOUNTER — Ambulatory Visit (INDEPENDENT_AMBULATORY_CARE_PROVIDER_SITE_OTHER)
Admission: RE | Admit: 2016-02-05 | Discharge: 2016-02-05 | Disposition: A | Discharge status: Home / Self Care | Payer: Medicare Other | Source: Ambulatory Visit | Attending: Pulmonary Disease | Admitting: Pulmonary Disease

## 2016-02-05 VITALS — Ht 65.0 in | Wt 144.2 lb

## 2016-02-05 DIAGNOSIS — R911 Solitary pulmonary nodule: Secondary | ICD-10-CM

## 2016-02-05 DIAGNOSIS — Z9889 Other specified postprocedural states: Secondary | ICD-10-CM | POA: Diagnosis not present

## 2016-02-05 DIAGNOSIS — Z950 Presence of cardiac pacemaker: Secondary | ICD-10-CM

## 2016-02-05 DIAGNOSIS — IMO0001 Reserved for inherently not codable concepts without codable children: Secondary | ICD-10-CM

## 2016-02-05 NOTE — Progress Notes (Signed)
Daily Session Note  Patient Details  Name: [name] [name] MRN: [id number] Date of Birth: [birth year] Referring Provider:  Thayer Headings, MD  Encounter Date: 02/05/2016  Check In:     Session Check In - 02/05/16 1611    Check-In   Location [facility]   Staff Present Lestine Box, BS, ACSM EP-C, Exercise Physiologist;Susanne Bice, RN, BSN, CCRP;Rebecca Sickles, PT   Supervising physician immediately available to respond to emergencies See telemetry face sheet for immediately available ER MD   Medication changes reported     No   Fall or balance concerns reported    No   Warm-up and Cool-down Performed on first and last piece of equipment   Resistance Training Performed No   VAD Patient? No   Pain Assessment   Currently in Pain? No/denies         Goals Met:  Proper associated with RPD/PD & O2 Sat Exercise tolerated well No report of cardiac concerns or symptoms Strength training completed today  Goals Unmet:  Not Applicable  Comments:    Dr. Emily Filbert is Medical Director for [place] and [facility].

## 2016-02-07 ENCOUNTER — Encounter: Payer: Medicare Other | Admitting: *Deleted

## 2016-02-07 DIAGNOSIS — Z9889 Other specified postprocedural states: Secondary | ICD-10-CM

## 2016-02-07 NOTE — Patient Instructions (Signed)
Discharge Instructions  Patient Details  Name: [name] [name] MRN: [id number] Date of Birth: [birth year] Referring Provider:  Thayer Headings, MD   Number of Visits: 36  Reason for Discharge:  Patient reached a stable level of exercise. Patient independent in their exercise.  Smoking History:  History  Smoking status  . Never Smoker   Smokeless tobacco  . Never Used    Diagnosis:  S/P mitral valve repair  S/P placement of cardiac pacemaker  Initial Exercise Prescription:     Initial Exercise Prescription - 11/29/15 1600    Date of Initial Exercise Prescription   Date 11/29/15   Treadmill   MPH 2   Grade 0   Minutes 10   Bike   Level 0.4   Minutes 10   Recumbant Bike   Level 3   RPM 40   Watts 25   Minutes 10   NuStep   Level 2   Watts 30   Minutes 15   Arm Ergometer   Level 1   Watts 8   Minutes 10   Arm/Foot Ergometer   Level 4   Watts 12   Minutes 10   Cybex   Level 1   RPM 50   Minutes 10   Recumbant Elliptical   Level 1   RPM 40   Watts 10   Minutes 10   Elliptical   Level 1   Speed 3   Minutes 1   REL-XR   Level 3   Watts 35   Minutes 15   T5 Nustep   Level 2   Watts 20   Minutes 15   Biostep-RELP   Level 3   Watts 20   Minutes 15   Prescription Details   Frequency (times per week) 3   Duration Progress to 30 minutes of continuous aerobic without signs/symptoms of physical distress   Intensity   THRR REST +  30   Ratings of Perceived Exertion 11-15   Progression   Progression Continue progressive overload as per policy without signs/symptoms or physical distress.   Resistance Training   Training Prescription Yes   Weight 2   Reps 10-15      Discharge Exercise Prescription (Final Exercise Prescription Changes):     Exercise Prescription Changes - 01/31/16 1600    Exercise Review   Progression Yes   Response to Exercise   Blood Pressure (Admit) 118/64 mmHg   Blood Pressure (Exercise) --   Blood Pressure  (Exit) --   Heart Rate (Admit) --   Heart Rate (Exercise) --   Heart Rate (Exit) --   Rating of Perceived Exertion (Exercise) --   Symptoms --   Comments --   Frequency --   Duration Progress to 50 minutes of aerobic without signs/symptoms of physical distress   Intensity Rest + 30   Progression   Progression Continue progressive overload as per policy without signs/symptoms or physical distress.   Interval Training   Interval Training No   Treadmill   MPH 3   Grade 2   Minutes 20      Functional Capacity:     6 Minute Walk      11/29/15 1618 02/05/16 1706     6 Minute Walk   Phase Initial Discharge    Distance 1275 feet 1400 feet    Walk Time 6 minutes 6 minutes    # of Rest Breaks  0    RPE 13 12    Symptoms No  No    Resting HR 88 bpm 77 bpm    Resting BP 118/74 mmHg 118/76 mmHg    Max Ex. HR 113 bpm 85 bpm    Max Ex. BP 126/62 mmHg 122/74 mmHg       Quality of Life:     Quality of Life - 02/07/16 0656    Quality of Life Scores   Health/Function Pre 19.73 %   Health/Function Post 24.55 %   Health/Function % Change 24 %   Socioeconomic Pre 30 %   Socioeconomic Post 29 %   Socioeconomic % Change -3 %   Psych/Spiritual Pre 23.79 %   Psych/Spiritual Post 24 %   Psych/Spiritual % Change 1 %   Family Pre 27 %   Family Post 28 %   Family % Change 4 %   GLOBAL Pre 23.7 %   GLOBAL Post 25.78 %   GLOBAL % Change 19 %      Personal Goals: Goals established at orientation with interventions provided to work toward goal.     Personal Goals and Risk Factors at Admission - 11/29/15 1836    Core Components/Risk Factors/Patient Goals on Admission   Personal Goal Patient would like to be able to make her on bed and vacumn again.     Intervention Start Cardiac Rehab and follow exercise prescription.  Patient's 8 week lifting restriction still in effect at this time.         Personal Goals Discharge:     Goals and Risk Factor Review - 02/01/16 1634    Core  Components/Risk Factors/Patient Goals Review   Personal Goals Review Lipids   Review [name] [name] said her lipid panel blood work was about perfect when she was taking Crestor every day but she was waking up about 3 times a night with leg cramps so MD changed her med to every other day.       Nutrition & Weight - Outcomes:     Pre Biometrics - 02/05/16 1706    Pre Biometrics   Height 5\' 5"  (1.651 m)   Weight 144 lb 3.2 oz (65.409 kg)   Waist Circumference 33.5 inches   Hip Circumference 38.5 inches   Waist to Hip Ratio 0.87 %   BMI (Calculated) 24       Nutrition:     Nutrition Therapy & Goals - 12/20/15 1652    Nutrition Therapy   Diet [name] [name] avoids greenery foods due to the coumadin and she is doing well with this eventhough it is hard for her. She really enjoys green vegetables and wishes she could eat them.    Drug/Food Interactions Coumadin/Vit K   Personal Nutrition Goals   Personal Goal #1 [name] [name] has been maintaining a gluten free diet for 4 years and this is working very well for her   Personal Goal #2 [name] [name] has been eating low sodium since 1999 and does very well with this.       Nutrition Discharge:     Nutrition Assessments - 02/07/16 0657    Rate Your Plate Scores   Post Score 80   Post Score % 89 %      Education Questionnaire Score:     Knowledge Questionnaire Score - 02/07/16 0656    Knowledge Questionnaire Score   Pre Score 27//28   Post Score 25/28

## 2016-02-07 NOTE — Progress Notes (Signed)
Daily Session Note  Patient Details  Name: [name] [name] MRN: [id number] Date of Birth: [birth year] Referring Provider:  [name], MD  Encounter Date: 02/07/2016  Check In:     Session Check In - 02/07/16 1633    Check-In   Location [facility]   Staff Present Gerlene Burdock, RN, Drusilla Kanner, MS, ACSM CEP, Exercise Physiologist;Doy Taaffe Joya Gaskins, RN, BSN   Supervising physician immediately available to respond to emergencies See telemetry face sheet for immediately available ER MD   Medication changes reported     No   Fall or balance concerns reported    No   Warm-up and Cool-down Performed on first and last piece of equipment   Resistance Training Performed Yes   VAD Patient? No   Pain Assessment   Currently in Pain? No/denies         Goals Met:  Independence with exercise equipment Exercise tolerated well No report of cardiac concerns or symptoms Strength training completed today  Goals Unmet:  Not Applicable  Comments: Patient completed exercise prescription and all exercise goals during rehab session. The exercise was tolerated well and the patient is progressing in the program.   Dr. Emily Filbert is Medical Director for [place] and [facility].

## 2016-02-08 ENCOUNTER — Encounter: Payer: Self-pay | Admitting: Internal Medicine

## 2016-02-08 ENCOUNTER — Other Ambulatory Visit: Payer: Self-pay | Admitting: Internal Medicine

## 2016-02-08 ENCOUNTER — Ambulatory Visit (INDEPENDENT_AMBULATORY_CARE_PROVIDER_SITE_OTHER): Payer: Medicare Other | Admitting: Internal Medicine

## 2016-02-08 VITALS — BP 110/70 | HR 84 | Ht 66.0 in | Wt 144.6 lb

## 2016-02-08 DIAGNOSIS — T85698A Other mechanical complication of other specified internal prosthetic devices, implants and grafts, initial encounter: Secondary | ICD-10-CM

## 2016-02-08 DIAGNOSIS — Z950 Presence of cardiac pacemaker: Secondary | ICD-10-CM

## 2016-02-08 DIAGNOSIS — I481 Persistent atrial fibrillation: Secondary | ICD-10-CM | POA: Diagnosis not present

## 2016-02-08 DIAGNOSIS — T85618A Breakdown (mechanical) of other specified internal prosthetic devices, implants and grafts, initial encounter: Secondary | ICD-10-CM

## 2016-02-08 DIAGNOSIS — Z9889 Other specified postprocedural states: Secondary | ICD-10-CM | POA: Diagnosis not present

## 2016-02-08 DIAGNOSIS — I4819 Other persistent atrial fibrillation: Secondary | ICD-10-CM

## 2016-02-08 LAB — CUP PACEART INCLINIC DEVICE CHECK
Battery Impedance: 100 Ohm
Battery Remaining Longevity: 57 mo
Battery Voltage: 2.79 V
Brady Statistic AP VP Percent: 76 %
Brady Statistic AP VS Percent: 1 %
Brady Statistic AS VP Percent: 24 %
Brady Statistic AS VS Percent: 0 %
Date Time Interrogation Session: 20170309154327
Implantable Lead Implant Date: 20161205
Implantable Lead Implant Date: 20161205
Implantable Lead Location: 753859
Implantable Lead Location: 753860
Implantable Lead Model: 5076
Implantable Lead Model: 5076
Lead Channel Impedance Value: 472 Ohm
Lead Channel Impedance Value: 887 Ohm
Lead Channel Pacing Threshold Amplitude: 0.5 V
Lead Channel Pacing Threshold Amplitude: 0.5 V
Lead Channel Pacing Threshold Amplitude: 2.75 V
Lead Channel Pacing Threshold Amplitude: 3.75 V
Lead Channel Pacing Threshold Pulse Width: 0.4 ms
Lead Channel Pacing Threshold Pulse Width: 0.4 ms
Lead Channel Pacing Threshold Pulse Width: 1 ms
Lead Channel Pacing Threshold Pulse Width: 1 ms
Lead Channel Sensing Intrinsic Amplitude: 1.4 mV
Lead Channel Sensing Intrinsic Amplitude: 5.6 mV
Lead Channel Setting Pacing Amplitude: 2.5 V
Lead Channel Setting Pacing Amplitude: 5 V
Lead Channel Setting Pacing Pulse Width: 0.4 ms
Lead Channel Setting Sensing Sensitivity: 2.8 mV

## 2016-02-08 LAB — BASIC METABOLIC PANEL
BUN: 20 mg/dL (ref 7–25)
CO2: 28 mmol/L (ref 20–31)
Calcium: 8.9 mg/dL (ref 8.6–10.4)
Chloride: 105 mmol/L (ref 98–110)
Creat: 0.88 mg/dL (ref 0.60–0.93)
Glucose, Bld: 78 mg/dL (ref 65–99)
Potassium: 4.9 mmol/L (ref 3.5–5.3)
Sodium: 139 mmol/L (ref 135–146)

## 2016-02-08 LAB — CBC WITH DIFFERENTIAL/PLATELET
Basophils Absolute: 0 10*3/uL (ref 0.0–0.1)
Basophils Relative: 1 % (ref 0–1)
Eosinophils Absolute: 0 10*3/uL (ref 0.0–0.7)
Eosinophils Relative: 1 % (ref 0–5)
HCT: 34.5 % — ABNORMAL LOW (ref 36.0–46.0)
Hemoglobin: 10.9 g/dL — ABNORMAL LOW (ref 12.0–15.0)
Lymphocytes Relative: 30 % (ref 12–46)
Lymphs Abs: 1.3 10*3/uL (ref 0.7–4.0)
MCH: 26.1 pg (ref 26.0–34.0)
MCHC: 31.6 g/dL (ref 30.0–36.0)
MCV: 82.5 fL (ref 78.0–100.0)
MPV: 11.3 fL (ref 8.6–12.4)
Monocytes Absolute: 0.4 10*3/uL (ref 0.1–1.0)
Monocytes Relative: 9 % (ref 3–12)
Neutro Abs: 2.5 10*3/uL (ref 1.7–7.7)
Neutrophils Relative %: 59 % (ref 43–77)
Platelets: 157 10*3/uL (ref 150–400)
RBC: 4.18 MIL/uL (ref 3.87–5.11)
RDW: 15.6 % — ABNORMAL HIGH (ref 11.5–15.5)
WBC: 4.3 10*3/uL (ref 4.0–10.5)

## 2016-02-08 LAB — PROTIME-INR
INR: 1.52 — ABNORMAL HIGH (ref <1.50)
Prothrombin Time: 18.5 seconds — ABNORMAL HIGH (ref 11.6–15.2)

## 2016-02-08 NOTE — Progress Notes (Signed)
HPI [name] [name] returns today for PPM followup. 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 She underwent PPM insertion. She has developed an increased atrial pacing threshold. She has completed cardiac rehab. No other complaints today.  Allergies  Allergen Reactions  . Oxycodone-Acetaminophen Other (See Comments)    Other Reaction: migraine  . Meloxicam Other (See Comments)    Severe reflux  . Codeine Other (See Comments)    migraine  . Doxycycline Itching  . Hydrocodone Nausea And Vomiting    MIGRAINE  . Hydromorphone Nausea And Vomiting    migraine  . Oxycodone Other (See Comments)    Severe migraine     Current Outpatient Prescriptions  Medication Sig Dispense Refill  . albuterol (PROVENTIL HFA;VENTOLIN HFA) 108 (90 BASE) MCG/ACT inhaler Inhale 2 puffs into the lungs every 6 (six) hours as needed for wheezing or shortness of breath. 1 Inhaler 2  . ALPRAZolam (XANAX) 0.25 MG tablet Take 1 tablet (0.25 mg total) by mouth at bedtime as needed for anxiety or sleep. 30 tablet 5  . amoxicillin (AMOXIL) 500 MG tablet Take 4 tablets (2,000 mg total) by mouth once. Take 4 tablets 60 minutes prior to dental work 30 tablet 1  . azelastine (ASTELIN) 0.1 % nasal spray Place 1 spray into both nostrils as needed for allergies.   5  . butalbital-acetaminophen-caffeine (FIORICET) 50-325-40 MG tablet Take by mouth 2 (two) times daily as needed for headache (or backpain).    . furosemide (LASIX) 40 MG tablet Take 40 mg by mouth daily as needed for fluid.    . metoprolol tartrate (LOPRESSOR) 25 MG tablet Take 0.5 tablets (12.5 mg total) by mouth 2 (two) times daily. 90 tablet 3  . montelukast (SINGULAIR) 10 MG tablet Take 10 mg by mouth daily.     . rosuvastatin (CRESTOR) 5 MG tablet Take 1 tablet (5 mg total) by mouth every other day. 45 tablet 3  . traMADol (ULTRAM) 50 MG tablet Take 1-2 tablets (50-100  mg total) by mouth every 4 (four) hours as needed for moderate pain. 30 tablet 0  . warfarin (COUMADIN) 1 MG tablet Take 1 tablet (1 mg total) by mouth daily. 90 tablet 3  . warfarin (COUMADIN) 2 MG tablet Take 1 tablet (2 mg total) by mouth as directed. 30 tablet 3   No current facility-administered medications for this visit.     Past Medical History  Diagnosis Date  . SVT (supraventricular tachycardia) ([place])   . MVP (mitral valve prolapse)   . Hyperlipidemia   . Idiopathic thrombocytopenic purpura (ITP)   . Hemorrhoid   . IBS (irritable bowel syndrome)   . Migraine   . Severe mitral regurgitation   . RBBB   . Anxiety associated with depression     Prn alprazolam   . Thoracic aorta atherosclerosis ([place])   . Nodule of right lung   . Restrictive lung disease     Mild on PFT & likely cardiac in etiology   . 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 Atrial fibrillation, persistent ([place])     DCCV 08/22/2015  . Acute on chronic diastolic (congestive) heart failure ([place])   . 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 S/P Minimally invasive maze operation for atrial fibrillation 10/31/2015  Complete bilateral atrial lesion set using cryothermy and bipolar radiofrequency ablation with clipping of LA appendage via right mini thoracotomy approach  . Bradycardia post-op bradycardia, pacer dependent    MDT PPM 11/06/15, Dr. Lovena Le    ROS:   All systems reviewed and negative except as noted in the HPI.   Past Surgical History  Procedure Laterality Date  . Tubal ligation    . Foot surgery    . Varicose vein surgery Right   . Mandible fracture surgery  03-26-13  . Colonoscopy  2003  . Eye surgery Right 2013  . Breast biopsy    . Cardioversion N/A 08/22/2015    Procedure: CARDIOVERSION;  Surgeon: Thayer Headings, MD;  Location: [facility] ENDOSCOPY;   Service: Cardiovascular;  Laterality: N/A;  . Tee without cardioversion N/A 08/22/2015    Procedure: TRANSESOPHAGEAL ECHOCARDIOGRAM (TEE);  Surgeon: Thayer Headings, MD;  Location[facility] ENDOSCOPY;  Service: Cardiovascular;  Laterality: N/A;  Darden Dates with cardioversion    . Cardiac catheterization N/A 10/18/2015    Procedure: Right/Left Heart Cath and Coronary Angiography;  Surgeon: Sherren Mocha, MD;  Location: [facility] CV LAB;  Service: Cardiovascular;  Laterality: N/A;  . Minimally invasive maze procedure N/A 10/31/2015    Procedure: MINIMALLY INVASIVE MAZE PROCEDURE;  Surgeon: Rexene Alberts, MD;  Location: [place];  Service: Open Heart Surgery;  Laterality: N/A;  . Tee without cardioversion N/A 10/31/2015    Procedure: TRANSESOPHAGEAL ECHOCARDIOGRAM (TEE);  Surgeon: Rexene Alberts, MD;  Location: [facility];  Service: Open Heart Surgery;  Laterality: N/A;  . Mitral valve replacement Right 10/31/2015    Procedure: MINIMALLY INVASIVE MITRAL VALVE (MV) REPLACEMENT;  Surgeon: Rexene Alberts, MD;  Location: [facility];  Service: Open Heart Surgery;  Laterality: Right;  . Ep implantable device N/A 11/06/2015    Procedure: Pacemaker Implant;  Surgeon: Evans Lance, MD;  Location: [facility] CV LAB;  Service: Cardiovascular;  Laterality: N/A;     Family History  Problem Relation Age of Onset  . Hypertension Mother   . Arrhythmia Mother   . Heart failure Mother   . Arrhythmia Brother   . Stroke Brother 51    cerebral hemorrhage, nonsmoker, no HTN  . Stroke Father     from an aneurysm  . Stroke Maternal Aunt 83    cerebral hemorrhage  . Liver cancer Maternal Grandmother   . Atrial fibrillation Son   . Heart attack Neg Hx      Social History   Social History  . Marital Status: Widowed    Spouse Name: Deceased  . Number of Children: N/A  . Years of Education: 16   Occupational History  . Not on file.   Social History Main Topics  . Smoking status: Never Smoker   . Smokeless tobacco:  Never Used  . Alcohol Use: No  . Drug Use: No  . Sexual Activity: No   Other Topics Concern  . Not on file   Social History Narrative   She is a widow.  Husband died from metastatic renal cell cancer. Originally from [place]. Previously lived in [place] from 1975-[place]. Moved to [place] in 2007. No mold exposure recently but did have it through a prior work exposure in 1993. Has a masters in public health. No bird exposure.     BP 110/70 mmHg  Pulse 84  Ht 5\' 6"  (1.676 m)  Wt 144 lb 9.6 oz (65.59 kg)  BMI 23.35 kg/m2  LMP  (Exact Date)  Physical Exam:  Well appearing 75 yo woman, NAD HEENT: Unremarkable Neck:  6 cm JVD, no thyromegally Lymphatics:  No adenopathy Back:  No CVA tenderness Lungs:  Clear with no wheezes HEART:  Regular rate rhythm, no murmurs, no rubs, no clicks Abd:  soft, positive bowel sounds, no organomegally, no rebound, no guarding Ext:  2 plus pulses, no edema, no cyanosis, no clubbing Skin:  No rashes no nodules Neuro:  CN II through XII intact, motor grossly intact   DEVICE  Normal device function except for elevated pacing thresholds.  See PaceArt for details.   Assess/Plan: 1. PPM lead dysfunction - her pacing threshold both bipolar and unipolar is elevated. She will need to undergo atrial lead revision. I would anticipate removing her old atrial lead and inserting a new one. The risks/benefits/goals/expectations of the procedure have been discussed and she wishes to proceed. 2. Atrial fibrillation - she is s/p MAZE. She will continue anti-coagulation and beta blocker therapy. She is maintaining NSR off of amiodarone. 3. Dyslipidemia - she will continue her statin therapy. 4. HTN - her blood pressure is well controlled. No change in meds.  Mikle Bosworth.D.

## 2016-02-08 NOTE — Progress Notes (Signed)
Daily Session Note  Patient Details  Name: ERLEAN MEALOR MRN: [id number] Date of Birth: [birth year] Referring Provider:  Thayer Headings, MD  Encounter Date: 02/08/2016  Check In:     Session Check In - 02/08/16 1607    Check-In   Location [facility]   Staff Present Gerlene Burdock, RN, Jana Half, RN, BSN;Shalie Schremp, BS, ACSM EP-C, Exercise Physiologist   Supervising physician immediately available to respond to emergencies See telemetry face sheet for immediately available ER MD   Medication changes reported     No   Fall or balance concerns reported    No   Warm-up and Cool-down Performed on first and last piece of equipment   Resistance Training Performed No   VAD Patient? No   Pain Assessment   Currently in Pain? No/denies         Goals Met:  Proper associated with RPD/PD & O2 Sat Exercise tolerated well No report of cardiac concerns or symptoms Strength training completed today  Goals Unmet:  Not Applicable  Comments:    Dr. Emily Filbert is Medical Director for [place] and [facility].

## 2016-02-08 NOTE — Progress Notes (Signed)
Cardiac Individual Treatment Plan  Patient Details  Name: [name] [name] MRN: [id number] Date of Birth: 1941/01/01 Referring Provider:  Thayer Headings, MD  Initial Encounter Date:    Visit Diagnosis: S/P mitral valve repair  S/P placement of cardiac pacemaker  Patient's Home Medications on Admission:  Current outpatient prescriptions:  .  albuterol (PROVENTIL HFA;VENTOLIN HFA) 108 (90 BASE) MCG/ACT inhaler, Inhale 2 puffs into the lungs every 6 (six) hours as needed for wheezing or shortness of breath., Disp: 1 Inhaler, Rfl: 2 .  ALPRAZolam (XANAX) 0.25 MG tablet, Take 1 tablet (0.25 mg total) by mouth at bedtime as needed for anxiety or sleep., Disp: 30 tablet, Rfl: 5 .  amoxicillin (AMOXIL) 500 MG tablet, Take 4 tablets (2,000 mg total) by mouth once. Take 4 tablets 60 minutes prior to dental work, Disp: 30 tablet, Rfl: 1 .  azelastine (ASTELIN) 0.1 % nasal spray, Place 1 spray into both nostrils as needed for allergies. , Disp: , Rfl: 5 .  butalbital-acetaminophen-caffeine (FIORICET) 50-325-40 MG tablet, Take by mouth 2 (two) times daily as needed for headache (or backpain)., Disp: , Rfl:  .  furosemide (LASIX) 40 MG tablet, Take 40 mg by mouth daily as needed for fluid., Disp: , Rfl:  .  metoprolol tartrate (LOPRESSOR) 25 MG tablet, Take 0.5 tablets (12.5 mg total) by mouth 2 (two) times daily., Disp: 90 tablet, Rfl: 3 .  montelukast (SINGULAIR) 10 MG tablet, Take 10 mg by mouth daily. , Disp: , Rfl:  .  rosuvastatin (CRESTOR) 5 MG tablet, Take 1 tablet (5 mg total) by mouth every other day., Disp: 45 tablet, Rfl: 3 .  traMADol (ULTRAM) 50 MG tablet, Take 1-2 tablets (50-100 mg total) by mouth every 4 (four) hours as needed for moderate pain., Disp: 30 tablet, Rfl: 0 .  warfarin (COUMADIN) 1 MG tablet, Take 1 tablet (1 mg total) by mouth daily., Disp: 90 tablet, Rfl: 3 .  warfarin (COUMADIN) 2 MG tablet, Take 1 tablet (2 mg total) by mouth as directed., Disp: 30 tablet, Rfl:  3  Past Medical History: Past Medical History  Diagnosis Date  . SVT (supraventricular tachycardia) ([place])   . MVP (mitral valve prolapse)   . Hyperlipidemia   . Idiopathic thrombocytopenic purpura (ITP)   . Hemorrhoid   . IBS (irritable bowel syndrome)   . Migraine   . Severe mitral regurgitation   . RBBB   . Anxiety associated with depression     Prn alprazolam   . Thoracic aorta atherosclerosis ([employer])   . Nodule of right lung   . Restrictive lung disease     Mild on PFT & likely cardiac in etiology   . 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 Atrial fibrillation, persistent ([place])     DCCV 08/22/2015  . Acute on chronic diastolic (congestive) heart failure ([place])   . 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 S/P Minimally invasive maze operation for atrial fibrillation 10/31/2015    Complete bilateral atrial lesion set using cryothermy and bipolar radiofrequency ablation with clipping of LA appendage via right mini thoracotomy approach  . Bradycardia post-op bradycardia, pacer dependent    MDT PPM 11/06/15, Dr. Lovena Le    Tobacco Use: History  Smoking status  . Never Smoker   Smokeless tobacco  . Never Used    Labs: Recent Review Flowsheet Data  Labs for ITP Cardiac and Pulmonary Rehab Latest Ref Rng 11/01/2015 11/01/2015 11/01/2015 11/01/2015 11/01/2015   PHART 7.350 - 7.450 7.391 7.391 - 7.226(L) -   PCO2ART 35.0 - 45.0 mmHg 38.8 37.9 - 59.5(HH) -   HCO3 20.0 - 24.0 mEq/L 23.6 23.1 - 24.7(H) -   TCO2 0 - 100 mmol/L _0 ACIDBASEDEF 0.0 - 2.0 mmol/L 1.0 2.0 - 4.0(H) -   O2SAT - 97.0 95.0 - 100.0 -       Exercise Target Goals:    Exercise Program Goal: Individual exercise prescription set with THRR, safety & activity barriers. Participant demonstrates ability to understand and report RPE using BORG scale, to  self-measure pulse accurately, and to acknowledge the importance of the exercise prescription.  Exercise Prescription Goal: Starting with aerobic activity 30 plus minutes a day, 3 days per week for initial exercise prescription. Provide home exercise prescription and guidelines that participant acknowledges understanding prior to discharge.  Activity Barriers & Risk Stratification:     Activity Barriers & Cardiac Risk Stratification - 11/29/15 1849    Activity Barriers & Cardiac Risk Stratification   Activity Barriers Incisional Pain;History of Falls;Arthritis;Back Problems;Other (comment)   Comments Incisional pain on right chest from MVR/s/p two chest tubes.  Patient is on an eight week lifting restriction of right arm through January 24th, 2017.        6 Minute Walk:     6 Minute Walk      11/29/15 1618 02/05/16 1706     6 Minute Walk   Phase Initial Discharge    Distance 1275 feet 1400 feet    Walk Time 6 minutes 6 minutes    # of Rest Breaks  0    RPE 13 12    Symptoms No No    Resting HR 88 bpm 77 bpm    Resting BP 118/74 mmHg 118/76 mmHg    Max Ex. HR 113 bpm 85 bpm    Max Ex. BP 126/62 mmHg 122/74 mmHg       Initial Exercise Prescription:     Initial Exercise Prescription - 11/29/15 1600    Date of Initial Exercise Prescription   Date 11/29/15   Treadmill   MPH 2   Grade 0   Minutes 10   Bike   Level 0.4   Minutes 10   Recumbant Bike   Level 3   RPM 40   Watts 25   Minutes 10   NuStep   Level 2   Watts 30   Minutes 15   Arm Ergometer   Level 1   Watts 8   Minutes 10   Arm/Foot Ergometer   Level 4   Watts 12   Minutes 10   Cybex   Level 1   RPM 50   Minutes 10   Recumbant Elliptical   Level 1   RPM 40   Watts 10   Minutes 10   Elliptical   Level 1   Speed 3   Minutes 1   REL-XR   Level 3   Watts 35   Minutes 15   T5 Nustep   Level 2   Watts 20   Minutes 15   Biostep-RELP   Level 3   Watts 20   Minutes 15   Prescription  Details   Frequency (times per week) 3   Duration Progress to 30 minutes of continuous aerobic without signs/symptoms of physical distress   Intensity  THRR REST +  30   Ratings of Perceived Exertion 11-15   Progression   Progression Continue progressive overload as per policy without signs/symptoms or physical distress.   Resistance Training   Training Prescription Yes   Weight 2   Reps 10-15      Exercise Prescription Changes:     Exercise Prescription Changes      12/18/15 0636 01/03/16 1600 01/15/16 1600 01/31/16 1600     Exercise Review   Progression Yes Yes Yes Yes    Response to Exercise   Blood Pressure (Admit) 112/60 mmHg  118/64 mmHg 118/64 mmHg    Blood Pressure (Exercise) 132/64 mmHg  122/72 mmHg --    Blood Pressure (Exit) 94/56 mmHg  104/60 mmHg --    Heart Rate (Admit) 94 bpm  92 bpm --    Heart Rate (Exercise) 98 bpm  101 bpm --    Heart Rate (Exit) 78 bpm  82 bpm --    Rating of Perceived Exertion (Exercise) 13  13 --    Symptoms None None None --    Comments Reviewed individualized exercise prescription and made increases per departmental policy. Exercise increases were discussed with the patient and they were able to perform the new work loads without issue (no signs or symptoms).  Increased Jazz's walking speed. She used to walk at 3.0 mph and we are slowly working her back to that intensity. She can successfully execise for the entire 30-65mnutes of cardio.  Pt increased treadmill time with no adverse reactions noted.  Patient demonstrates good understanding of prescription change. Overall, patient is progressing well in program and works well independently. NJoniquais doing more at home and walking on days she does not come to exercise. We will continue to follow up with [name] [name] her home exercise.  --    Frequency   Add 2 additional days to program exercise sessions. --    Duration Progress to 30 minutes of continuous aerobic without signs/symptoms of  physical distress Progress to 50 minutes of aerobic without signs/symptoms of physical distress Progress to 50 minutes of aerobic without signs/symptoms of physical distress Progress to 50 minutes of aerobic without signs/symptoms of physical distress    Intensity Rest + 30 Rest + 30 Rest + 30 Rest + 30    Progression   Progression Continue progressive overload as per policy without signs/symptoms or physical distress. Continue progressive overload as per policy without signs/symptoms or physical distress. Continue progressive overload as per policy without signs/symptoms or physical distress. Continue progressive overload as per policy without signs/symptoms or physical distress.    Resistance Training   Training Prescription (read-only) Yes Yes Yes     Weight (read-only) _0 Reps (read-only) 10-15 10-15 10-15     Interval Training   Interval Training No No No No    Treadmill   MPH    3    Grade    2    Minutes    20    MPH (read-only) 2.7 2.8 2.8     Grade (read-only) 0 2 2     Minutes (read-only) _1 REL-XR   Level (read-only) _2 Watts (read-only) 45 70 70     Minutes (read-only) _3 Discharge Exercise Prescription (Final Exercise Prescription Changes):     Exercise Prescription Changes - 01/31/16 1600  Exercise Review   Progression Yes   Response to Exercise   Blood Pressure (Admit) 118/64 mmHg   Blood Pressure (Exercise) --   Blood Pressure (Exit) --   Heart Rate (Admit) --   Heart Rate (Exercise) --   Heart Rate (Exit) --   Rating of Perceived Exertion (Exercise) --   Symptoms --   Comments --   Frequency --   Duration Progress to 50 minutes of aerobic without signs/symptoms of physical distress   Intensity Rest + 30   Progression   Progression Continue progressive overload as per policy without signs/symptoms or physical distress.   Interval Training   Interval Training No   Treadmill   MPH 3   Grade 2   Minutes 20       Nutrition:  Target Goals: Understanding of nutrition guidelines, daily intake of sodium <1521m, cholesterol <2078m calories 30% from fat and 7% or less from saturated fats, daily to have 5 or more servings of fruits and vegetables.  Biometrics:     Pre Biometrics - 02/05/16 1706    Pre Biometrics   Height _0  (1.651 m)   Weight 144 lb 3.2 oz (65.409 kg)   Waist Circumference 33.5 inches   Hip Circumference 38.5 inches   Waist to Hip Ratio 0.87 %   BMI (Calculated) 24       Nutrition Therapy Plan and Nutrition Goals:     Nutrition Therapy & Goals - 12/20/15 1652    Nutrition Therapy   Diet NaLavettavoids greenery foods due to the coumadin and she is doing well with this eventhough it is hard for her. She really enjoys green vegetables and wishes she could eat them.    Drug/Food Interactions Coumadin/Vit K   Personal Nutrition Goals   Personal Goal #1 NaVelvieas been maintaining a gluten free diet for 4 years and this is working very well for her   Personal Goal #2 NaTerresaas been eating low sodium since 1999 and does very well with this.       Nutrition Discharge: Rate Your Plate Scores:     Nutrition Assessments - 02/07/16 0657    Rate Your Plate Scores   Post Score 80   Post Score % 89 %      Nutrition Goals Re-Evaluation:     Nutrition Goals Re-Evaluation      12/07/15 1720 02/01/16 1634 02/08/16 1744       Personal Goal #1 Re-Evaluation   Personal Goal #1 NaIzora Galaaid she has cut out salt so she has not needed to really take her prn Lasix except for about 2 times.        Goal Progress Seen Yes  Yes     Comments   NaJennices still eating healthy for the most part.      Personal Goal #2 Re-Evaluation   Personal Goal #2  NaJacquleneaid she and her husband have eaten low sodium but it is very tough when she is traveling and has to eat out.       Goal Progress Seen  Yes         Psychosocial: Target Goals: Acknowledge presence or absence of depression, maximize  coping skills, provide positive support system. Participant is able to verbalize types and ability to use techniques and skills needed for reducing stress and depression.  Initial Review & Psychosocial Screening:     Initial Psych Review & Screening - 11/29/15 1833    Family Dynamics   Good  Support System? Yes   Comments Pt has good support system, as she has one adult son and adult daughter and 4 grand children.     Barriers   Psychosocial barriers to participate in program There are no identifiable barriers or psychosocial needs.   Screening Interventions   Interventions Encouraged to exercise;Program counselor consult      Quality of Life Scores:     Quality of Life - 02/07/16 0656    Quality of Life Scores   Health/Function Pre 19.73 %   Health/Function Post 24.55 %   Health/Function % Change 24 %   Socioeconomic Pre 30 %   Socioeconomic Post 29 %   Socioeconomic % Change -3 %   Psych/Spiritual Pre 23.79 %   Psych/Spiritual Post 24 %   Psych/Spiritual % Change 1 %   Family Pre 27 %   Family Post 28 %   Family % Change 4 %   GLOBAL Pre 23.7 %   GLOBAL Post 25.78 %   GLOBAL % Change 19 %      PHQ-9:     Recent Review Flowsheet Data    Depression screen [facility] 2/9 02/07/2016 11/29/2015 02/02/2015   Decreased Interest 0 0 0   Down, Depressed, Hopeless 0 0 0   PHQ - 2 Score 0 0 0   Altered sleeping 0 1 -   Tired, decreased energy 0 1 -   Change in appetite 0 0 -   Feeling bad or failure about yourself  0 0 -   Trouble concentrating 0 0 -   Moving slowly or fidgety/restless 0 0 -   Suicidal thoughts 0 0 -   PHQ-9 Score 0 2 -   Difficult doing work/chores Not difficult at all Not difficult at all -      Psychosocial Evaluation and Intervention:     Psychosocial Evaluation - 12/13/15 1658    Psychosocial Evaluation & Interventions   Comments Counselor met with [name] [name] ([name] [name]) for initial psychosocial evaluation.  She is a 75 year old well-adjusted individual  who had mitral valve replacement and a pacemaker over the past few months.  She is a widow who lives alone, but has family and friends close by as well as active involvement in her local church community.  [name] [name] states that she is sleeping better lately and her appetite is good.  She reports a history of anxiety with some recent symptoms.  She has been prescribed medication to take as needed for anxiety symptoms.  She reports a typically positive mood and has goals to return to her normal independent activities in the near future.  She states the most stressful thing for her currently is having to ask others for help.  Counselor will continue to follow with [name] [name] over the course of this program.     Continued Psychosocial Services Needed --  Counselor presented psychoeducation today on Stress management and [name] [name] was an active participant in this class in order to learn and begin to practice new coping skills in her life to combat stress.        Psychosocial Re-Evaluation:     Psychosocial Re-Evaluation      12/07/15 1723 12/20/15 1619 02/01/16 1636 02/08/16 1744     Psychosocial Re-Evaluation   Interventions Encouraged to attend Cardiac Rehabilitation for the exercise  Encouraged to attend Cardiac Rehabilitation for the exercise     Comments [name] [name] said "I will be here every time I am suppose to but  I have never liked to exercise. I suggested for her to bring her Kindle to read a book while exercising on the Nustep. I suggested a book on tape but [name] [name] said she doesn't like anything in her ear. She will try her Kindle to pass time in Cardiac rehab. [name] [name] said she was stressed today when she came to Cardiac Rehab the revolving door got stuck but the Valet guys fixed it.  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 She hopes to go to [place] with her camera/photography club to take pictures so is wearing her 8.9 lb  camera backpack today since she has missed those trips.  [name] [name] was a little upset this evening since she found out she needs her pacemaker wire replaced to prolong the battery life. It is ok now but concerned about the future.        Vocational Rehabilitation: Provide vocational rehab assistance to qualifying candidates.   Vocational Rehab Evaluation & Intervention:     Vocational Rehab - 11/29/15 1819    Initial Vocational Rehab Evaluation & Intervention   Assessment shows need for Vocational Rehabilitation No      Education: Education Goals: Education classes will be provided on a weekly basis, covering required topics. Participant will state understanding/return demonstration of topics presented.  Learning Barriers/Preferences:     Learning Barriers/Preferences - 11/29/15 1816    Learning Barriers/Preferences   Learning Barriers None   Learning Preferences None      Education Topics: General Nutrition Guidelines/Fats and Fiber: -Group instruction provided by verbal, written material, models and posters to present the general guidelines for heart healthy nutrition. Gives an explanation and review of dietary fats and fiber.   Controlling Sodium/Reading Food Labels: -Group verbal and written material supporting the discussion of sodium use in heart healthy nutrition. Review and explanation with models, verbal and written materials for utilization of the food label.   Exercise Physiology & Risk Factors: - Group verbal and written instruction with models to review the exercise physiology of the cardiovascular system and associated critical values. Details cardiovascular disease risk factors and the goals associated with each risk factor.          Cardiac Rehab from 02/05/2016 in [facility] Cardiac and Pulmonary Rehab   Date  01/29/16   Educator  sw   Instruction Review Code  2- meets goals/outcomes      Aerobic Exercise & Resistance Training: - Gives group verbal and written  discussion on the health impact of inactivity. On the components of aerobic and resistive training programs and the benefits of this training and how to safely progress through these programs.      Cardiac Rehab from 02/05/2016 in [facility] Cardiac and Pulmonary Rehab   Date  01/31/16   Educator  BS   Instruction Review Code  2- meets goals/outcomes      Flexibility, Balance, General Exercise Guidelines: - Provides group verbal and written instruction on the benefits of flexibility and balance training programs. Provides general exercise guidelines with specific guidelines to those with heart or lung disease. Demonstration and skill practice provided.      Cardiac Rehab from 02/05/2016 in [facility] Cardiac and Pulmonary Rehab   Date  02/05/16   Educator  BS   Instruction Review Code  2- meets goals/outcomes      Stress Management: - Provides group verbal and written instruction about the health risks of elevated stress,  cause of high stress, and healthy ways to reduce stress.      Cardiac Rehab from 02/05/2016 in [facility] Cardiac and Pulmonary Rehab   Date  12/13/15   Educator  Kathreen Cornfield   Instruction Review Code  2- meets goals/outcomes      Depression: - Provides group verbal and written instruction on the correlation between heart/lung disease and depressed mood, treatment options, and the stigmas associated with seeking treatment.   Anatomy & Physiology of the Heart: - Group verbal and written instruction and models provide basic cardiac anatomy and physiology, with the coronary electrical and arterial systems. Review of: AMI, Angina, Valve disease, Heart Failure, Cardiac Arrhythmia, Pacemakers, and the ICD.      Cardiac Rehab from 02/05/2016 in [facility] Cardiac and Pulmonary Rehab   Date  12/25/15   Educator  SB   Instruction Review Code  2- meets goals/outcomes      Cardiac Procedures: - Group verbal and written instruction and models to describe the testing methods done to diagnose heart  disease. Reviews the outcomes of the test results. Describes the treatment choices: Medical Management, Angioplasty, or Coronary Bypass Surgery.   Cardiac Medications: - Group verbal and written instruction to review commonly prescribed medications for heart disease. Reviews the medication, class of the drug, and side effects. Includes the steps to properly store meds and maintain the prescription regimen.   Go Sex-Intimacy & Heart Disease, Get SMART - Goal Setting: - Group verbal and written instruction through game format to discuss heart disease and the return to sexual intimacy. Provides group verbal and written material to discuss and apply goal setting through the application of the S.M.A.R.T. Method.   Other Matters of the Heart: - Provides group verbal, written materials and models to describe Heart Failure, Angina, Valve Disease, and Diabetes in the realm of heart disease. Includes description of the disease process and treatment options available to the cardiac patient.      Cardiac Rehab from 02/05/2016 in [facility] Cardiac and Pulmonary Rehab   Date  01/01/16   Educator  SB   Instruction Review Code  2- meets goals/outcomes      Exercise & Equipment Safety: - Individual verbal instruction and demonstration of equipment use and safety with use of the equipment.      Cardiac Rehab from 02/05/2016 in [facility] Cardiac and Pulmonary Rehab   Date  11/29/15   Educator  DW   Instruction Review Code  1- partially meets, needs review/practice      Infection Prevention: - Provides verbal and written material to individual with discussion of infection control including proper hand washing and proper equipment cleaning during exercise session.      Cardiac Rehab from 02/05/2016 in [facility] Cardiac and Pulmonary Rehab   Date  11/29/15   Educator  DW   Instruction Review Code  2- meets goals/outcomes      Falls Prevention: - Provides verbal and written material to individual with discussion of falls  prevention and safety.      Cardiac Rehab from 02/05/2016 in [facility] Cardiac and Pulmonary Rehab   Date  12/07/15   Educator  C. EnterkinRN   Instruction Review Code  2- meets goals/outcomes [Discussed when blood pressure was 70/40 to drink water first]      Diabetes: - Individual verbal and written instruction to review signs/symptoms of diabetes, desired ranges of glucose level fasting, after meals and with exercise. Advice that pre and post exercise glucose checks will be done for 3  sessions at entry of program.    Knowledge Questionnaire Score:     Knowledge Questionnaire Score - 02/07/16 0656    Knowledge Questionnaire Score   Pre Score 27//28   Post Score 25/28      Personal Goals and Risk Factors at Admission:     Personal Goals and Risk Factors at Admission - 11/29/15 1836    Core Components/Risk Factors/Patient Goals on Admission   Personal Goal Patient would like to be able to make her on bed and vacumn again.     Intervention Start Cardiac Rehab and follow exercise prescription.  Patient's 8 week lifting restriction still in effect at this time.        Personal Goals and Risk Factors Review:      Goals and Risk Factor Review      12/07/15 1721 12/19/15 0639 12/25/15 1814 01/04/16 1648 02/01/16 1634   Core Components/Risk Factors/Patient Goals Review   Personal Goals Review Increase Aerobic Exercise and Physical Activity  Increase Aerobic Exercise and Physical Activity;Lipids  Lipids   Review     Sharonna said her lipid panel blood work was about perfect when she was taking Crestor every day but she was waking up about 3 times a night with leg cramps so MD changed her med to every other day.    Increase Aerobic Exercise and Physical Activity (read-only)   Goals Progress/Improvement seen  Yes Yes      Comments Almena said "I will be here every time I am suppose to but I have never liked to exercise. I suggested for her to bring her Kindle to read a book while exercising on  the Nustep.  [name] [name] went shopping the other day and was able to walk to all the stores she wanted to go to. She did start to feel some SOB at the last store and she said this indicated she still has some work to do with exercise to further build up her endurance. She is progressing well on all equipment and is very responsive to suggestions and challenges.  [name] [name] was able to walk up her neighbor's inclined driveway today after she walked her driveway. Then she went into [employer] to shop before arriving to [facility].  She stated that she was tired walking into the hosiptal after al the other activity today. She had no concerns nor complaints about the exercise session today.     Abnormal Lipids (read-only)   Goal --  [name] [name] will follow up with her MD about her cholesterol. still taking statin.  Cholesterol controlled with medications as prescribed, with individualized exercise RX and with personalized nutrition plan. Value goals: LDL < 47m, HDL > 411m Participant states understanding of desired cholesterol values and following prescriptions.     Comments   Is aware of risk factor and the steps to take to help keep levels under control.     Other Goal (read-only)   Goals Progress/Improvement seen     Met  [name] [name] to be able to make her bed and vacuum     Comments    NaAnekaas met this goal.        Personal Goals Discharge (Final Personal Goals and Risk Factors Review):      Goals and Risk Factor Review - 02/01/16 1634    Core Components/Risk Factors/Patient Goals Review   Personal Goals Review Lipids   Review NaNaquitaaid her lipid panel blood work was about perfect when she was taking Crestor every day but she was  waking up about 3 times a night with leg cramps so MD changed her med to every other day.       ITP Comments:     ITP Comments      12/03/15 1406 12/29/15 1255 01/04/16 1650 01/23/16 0709 01/23/16 0712   ITP Comments Ready for 30 day review.  Continue with ITP   New to program, has  attended orientation will start sessions soon. Ready for  30 day review.  Continue with ITP Stana has set a new goal: She wants to go on a photography field trip . She wants to be able to carry a camera and also wear a backpack with another camera and equipment. And she wants to be able to keep up with the rest of the group.   Short term goals:  Get permission from MD  in the next month appointment to wear a backpack. Then start wwearing the backpack during exercie sessions to gain strength carrying the backpack. Longterm Goal: Ability to go on a one day field trip wiht camera and backpack. Building up to a five day fireld trip. 30 day review.  Continue with ITP 30 day review.  Continue with ITP  Continues to be absent. No calls to program     02/01/16 1633 02/08/16 1743         ITP Comments Zettie is wearing her 8.9lb camera backpack since her goal is to return to take photography trips. Markiya reports she had to cancel a photography trip last year but hopes to take a trip to [place]. She knows to stop eveyr 2 hours when she drives.  Wisdom said she needs to have her pacemaker wire fixed.          Comments: Has completed 36 sessions.

## 2016-02-08 NOTE — Addendum Note (Signed)
Addended by: Gerlene Burdock on: 02/08/2016 05:49 PM   Modules accepted: Orders

## 2016-02-08 NOTE — Progress Notes (Signed)
Discharge Summary  Patient Details  Name: [name] [name] MRN: [id number] Date of Birth: 06/14/1941 Referring Provider:  Thayer Headings, MD   Number of Visits: 36  Reason for Discharge:  Patient reached a stable level of exercise. Patient independent in their exercise.  Smoking History:  History  Smoking status  . Never Smoker   Smokeless tobacco  . Never Used    Diagnosis:  S/P mitral valve repair  S/P placement of cardiac pacemaker  ADL UCSD:   Initial Exercise Prescription:     Initial Exercise Prescription - 11/29/15 1600    Date of Initial Exercise Prescription   Date 11/29/15   Treadmill   MPH 2   Grade 0   Minutes 10   Bike   Level 0.4   Minutes 10   Recumbant Bike   Level 3   RPM 40   Watts 25   Minutes 10   NuStep   Level 2   Watts 30   Minutes 15   Arm Ergometer   Level 1   Watts 8   Minutes 10   Arm/Foot Ergometer   Level 4   Watts 12   Minutes 10   Cybex   Level 1   RPM 50   Minutes 10   Recumbant Elliptical   Level 1   RPM 40   Watts 10   Minutes 10   Elliptical   Level 1   Speed 3   Minutes 1   REL-XR   Level 3   Watts 35   Minutes 15   T5 Nustep   Level 2   Watts 20   Minutes 15   Biostep-RELP   Level 3   Watts 20   Minutes 15   Prescription Details   Frequency (times per week) 3   Duration Progress to 30 minutes of continuous aerobic without signs/symptoms of physical distress   Intensity   THRR REST +  30   Ratings of Perceived Exertion 11-15   Progression   Progression Continue progressive overload as per policy without signs/symptoms or physical distress.   Resistance Training   Training Prescription Yes   Weight 2   Reps 10-15      Discharge Exercise Prescription (Final Exercise Prescription Changes):     Exercise Prescription Changes - 01/31/16 1600    Exercise Review   Progression Yes   Response to Exercise   Blood Pressure (Admit) 118/64 mmHg   Blood Pressure (Exercise) --   Blood  Pressure (Exit) --   Heart Rate (Admit) --   Heart Rate (Exercise) --   Heart Rate (Exit) --   Rating of Perceived Exertion (Exercise) --   Symptoms --   Comments --   Frequency --   Duration Progress to 50 minutes of aerobic without signs/symptoms of physical distress   Intensity Rest + 30   Progression   Progression Continue progressive overload as per policy without signs/symptoms or physical distress.   Interval Training   Interval Training No   Treadmill   MPH 3   Grade 2   Minutes 20      Functional Capacity:     6 Minute Walk      11/29/15 1618 02/05/16 1706     6 Minute Walk   Phase Initial Discharge    Distance 1275 feet 1400 feet    Walk Time 6 minutes 6 minutes    # of Rest Breaks  0    RPE 13 12  Symptoms No No    Resting HR 88 bpm 77 bpm    Resting BP 118/74 mmHg 118/76 mmHg    Max Ex. HR 113 bpm 85 bpm    Max Ex. BP 126/62 mmHg 122/74 mmHg       Psychological, QOL, Others - Outcomes: PHQ 2/9: Depression screen [facility] 2/9 02/07/2016 11/29/2015 02/02/2015  Decreased Interest 0 0 0  Down, Depressed, Hopeless 0 0 0  PHQ - 2 Score 0 0 0  Altered sleeping 0 1 -  Tired, decreased energy 0 1 -  Change in appetite 0 0 -  Feeling bad or failure about yourself  0 0 -  Trouble concentrating 0 0 -  Moving slowly or fidgety/restless 0 0 -  Suicidal thoughts 0 0 -  PHQ-9 Score 0 2 -  Difficult doing work/chores Not difficult at all Not difficult at all -    Quality of Life:     Quality of Life - 02/07/16 0656    Quality of Life Scores   Health/Function Pre 19.73 %   Health/Function Post 24.55 %   Health/Function % Change 24 %   Socioeconomic Pre 30 %   Socioeconomic Post 29 %   Socioeconomic % Change -3 %   Psych/Spiritual Pre 23.79 %   Psych/Spiritual Post 24 %   Psych/Spiritual % Change 1 %   Family Pre 27 %   Family Post 28 %   Family % Change 4 %   GLOBAL Pre 23.7 %   GLOBAL Post 25.78 %   GLOBAL % Change 19 %      Personal Goals: Goals  established at orientation with interventions provided to work toward goal.     Personal Goals and Risk Factors at Admission - 11/29/15 1836    Core Components/Risk Factors/Patient Goals on Admission   Personal Goal Patient would like to be able to make her on bed and vacumn again.     Intervention Start Cardiac Rehab and follow exercise prescription.  Patient's 8 week lifting restriction still in effect at this time.         Personal Goals Discharge:     Goals and Risk Factor Review      12/07/15 1721 12/19/15 7591 12/25/15 1814 01/04/16 1648 02/01/16 1634   Core Components/Risk Factors/Patient Goals Review   Personal Goals Review Increase Aerobic Exercise and Physical Activity  Increase Aerobic Exercise and Physical Activity;Lipids  Lipids   Review     [name] [name] said her lipid panel blood work was about perfect when she was taking Crestor every day but she was waking up about 3 times a night with leg cramps so MD changed her med to every other day.    Increase Aerobic Exercise and Physical Activity (read-only)   Goals Progress/Improvement seen  Yes Yes      Comments [name] [name] said "I will be here every time I am suppose to but I have never liked to exercise. I suggested for her to bring her Kindle to read a book while exercising on the Nustep.  [name] [name] went shopping the other day and was able to walk to all the stores she wanted to go to. She did start to feel some SOB at the last store and she said this indicated she still has some work to do with exercise to further build up her endurance. She is progressing well on all equipment and is very responsive to suggestions and challenges.  [name] [name] was able to walk up her neighbor's inclined driveway  today after she walked her driveway. Then she went into [employer] to shop before arriving to [facility].  She stated that she was tired walking into the hosiptal after al the other activity today. She had no concerns nor complaints about the exercise session today.      Abnormal Lipids (read-only)   Goal --  [name] [name] will follow up with her MD about her cholesterol. still taking statin.  Cholesterol controlled with medications as prescribed, with individualized exercise RX and with personalized nutrition plan. Value goals: LDL < 68m, HDL > 440m Participant states understanding of desired cholesterol values and following prescriptions.     Comments   Is aware of risk factor and the steps to take to help keep levels under control.     Other Goal (read-only)   Goals Progress/Improvement seen     Met  NaMariekeanted to be able to make her bed and vacuum     Comments    NaMarkeshiaas met this goal.        Nutrition & Weight - Outcomes:     Pre Biometrics - 02/05/16 1706    Pre Biometrics   Height _0  (1.651 m)   Weight 144 lb 3.2 oz (65.409 kg)   Waist Circumference 33.5 inches   Hip Circumference 38.5 inches   Waist to Hip Ratio 0.87 %   BMI (Calculated) 24       Nutrition:     Nutrition Therapy & Goals - 12/20/15 1652    Nutrition Therapy   Diet NaIzora Galavoids greenery foods due to the coumadin and she is doing well with this eventhough it is hard for her. She really enjoys green vegetables and wishes she could eat them.    Drug/Food Interactions Coumadin/Vit K   Personal Nutrition Goals   Personal Goal #1 NaAtinaas been maintaining a gluten free diet for 4 years and this is working very well for her   Personal Goal #2 NaRitikaas been eating low sodium since 1999 and does very well with this.       Nutrition Discharge:     Nutrition Assessments - 02/07/16 0657    Rate Your Plate Scores   Post Score 80   Post Score % 89 %      Education Questionnaire Score:     Knowledge Questionnaire Score - 02/07/16 0656    Knowledge Questionnaire Score   Pre Score 27//28   Post Score 25/28      Goals reviewed with patient; copy given to patient.

## 2016-02-08 NOTE — Patient Instructions (Signed)
Medication Instructions:  Your physician recommends that you continue on your current medications as directed. Please refer to the Current Medication list given to you today.   Labwork: Your physician recommends that you return for lab work today: BMP/CBC/INR   Testing/Procedures: Atrial lead revision on 02/20/16 at [facility] by Dr Lovena Le  Please arrive at the [facility] Entrance at 5:30am.  Do Not eat or drink after midnight the night before your procedure.  Last dose of Coumadin 02/18/16.  Hold medications the morning of your procedure  Follow-Up:  Your physician recommends that you schedule a follow-up appointment in: 10-14 days from 02/20/16 in the device clinic for a wound check and 3 months from 02/20/16 with Dr Lovena Le   Any Other Special Instructions Will Be Listed Below (If Applicable).     If you need a refill on your cardiac medications before your next appointment, please call your pharmacy.

## 2016-02-09 NOTE — Telephone Encounter (Signed)
Ok to fill 

## 2016-02-09 NOTE — Telephone Encounter (Signed)
refilled 

## 2016-02-12 ENCOUNTER — Ambulatory Visit (INDEPENDENT_AMBULATORY_CARE_PROVIDER_SITE_OTHER): Payer: Medicare Other | Admitting: Pulmonary Disease

## 2016-02-12 ENCOUNTER — Encounter: Payer: Self-pay | Admitting: Pulmonary Disease

## 2016-02-12 VITALS — BP 126/68 | HR 89 | Ht 65.0 in | Wt 143.8 lb

## 2016-02-12 DIAGNOSIS — R911 Solitary pulmonary nodule: Secondary | ICD-10-CM | POA: Diagnosis not present

## 2016-02-12 DIAGNOSIS — J984 Other disorders of lung: Secondary | ICD-10-CM

## 2016-02-12 DIAGNOSIS — IMO0001 Reserved for inherently not codable concepts without codable children: Secondary | ICD-10-CM

## 2016-02-12 NOTE — Progress Notes (Signed)
Subjective:    Patient ID: [name] [name], female    DOB: [birth year], 75 y.o.   MRN: [id number]  C.C.:  Follow-up for Right Lung Opacity/Nodule, Mild Restrictive Lung Disease, & Pulmonary Hypertension.  HPI Right Lung Opacity/Nodule:  Right lower lobe nodule initially found on CT imaging in September 2016. Stable on repeat CT imaging 02/05/16. Denies any dyspnea or cough.   Mild Restrictive Lung Disease: No evidence for interstitial lung disease on CT imaging. Likely secondary to LVH. No further testing needed. Continues to deny any dyspnea. Successfully completed cardiac rehabilitation.  Pulmonary Hypertension:  Likely secondary to LVH, Diastolic Dysfunction & Mitral valve dysfunction. S/P mitral valve replacement.   Review of Systems Denies any chest pain or pressure. No nausea or emesis. No fever, chills, or sweats.  Allergies  Allergen Reactions  . Oxycodone-Acetaminophen Other (See Comments)    Other Reaction: migraine  . Meloxicam Other (See Comments)    Severe reflux  . Codeine Other (See Comments)    migraine  . Doxycycline Itching  . Hydrocodone Nausea And Vomiting    MIGRAINE  . Hydromorphone Nausea And Vomiting    migraine  . Oxycodone Other (See Comments)    Severe migraine   Current Outpatient Prescriptions on File Prior to Visit  Medication Sig Dispense Refill  . albuterol (PROVENTIL HFA;VENTOLIN HFA) 108 (90 BASE) MCG/ACT inhaler Inhale 2 puffs into the lungs every 6 (six) hours as needed for wheezing or shortness of breath. 1 Inhaler 2  . ALPRAZolam (XANAX) 0.25 MG tablet Take 1 tablet (0.25 mg total) by mouth at bedtime as needed for anxiety or sleep. 30 tablet 5  . amoxicillin (AMOXIL) 500 MG tablet Take 4 tablets (2,000 mg total) by mouth once. Take 4 tablets 60 minutes prior to dental work 30 tablet 1  . azelastine (ASTELIN) 0.1 % nasal spray Place 1 spray into both nostrils as needed for allergies.   5  . butalbital-acetaminophen-caffeine (FIORICET, ESGIC)  50-325-40 MG tablet TAKE 1 TABLET BY MOUTH AS NEEDED FOR BACK PAIN OR HEADACHE 90 tablet 2  . furosemide (LASIX) 40 MG tablet Take 40 mg by mouth daily as needed for fluid.    . metoprolol tartrate (LOPRESSOR) 25 MG tablet Take 0.5 tablets (12.5 mg total) by mouth 2 (two) times daily. 90 tablet 3  . montelukast (SINGULAIR) 10 MG tablet Take 10 mg by mouth daily.     . rosuvastatin (CRESTOR) 5 MG tablet Take 1 tablet (5 mg total) by mouth every other day. 45 tablet 3  . traMADol (ULTRAM) 50 MG tablet Take 1-2 tablets (50-100 mg total) by mouth every 4 (four) hours as needed for moderate pain. 30 tablet 0  . warfarin (COUMADIN) 1 MG tablet Take 1 tablet (1 mg total) by mouth daily. 90 tablet 3  . warfarin (COUMADIN) 2 MG tablet Take 1 tablet (2 mg total) by mouth as directed. 30 tablet 3   No current facility-administered medications on file prior to visit.   Past Medical History  Diagnosis Date  . SVT (supraventricular tachycardia) ([place])   . MVP (mitral valve prolapse)   . Hyperlipidemia   . Idiopathic thrombocytopenic purpura (ITP)   . Hemorrhoid   . IBS (irritable bowel syndrome)   . Migraine   . Severe mitral regurgitation   . RBBB   . Anxiety associated with depression     Prn alprazolam   . Thoracic aorta atherosclerosis ([place])   . Nodule of right lung   . Restrictive lung  disease     Mild on PFT & likely cardiac in etiology   . 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 Atrial fibrillation, persistent ([place])     DCCV 08/22/2015  . Acute on chronic diastolic (congestive) heart failure ([place])   . 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 S/P Minimally invasive maze operation for atrial fibrillation 10/31/2015    Complete bilateral atrial lesion set using cryothermy and bipolar radiofrequency ablation with clipping of LA appendage via right  mini thoracotomy approach  . Bradycardia post-op bradycardia, pacer dependent    MDT PPM 11/06/15, Dr. Lovena Le   Past Surgical History  Procedure Laterality Date  . Tubal ligation    . Foot surgery    . Varicose vein surgery Right   . Mandible fracture surgery  03-26-13  . Colonoscopy  2003  . Eye surgery Right 2013  . Breast biopsy    . Cardioversion N/A 08/22/2015    Procedure: CARDIOVERSION;  Surgeon: Thayer Headings, MD;  [facility] ENDOSCOPY;  Service: Cardiovascular;  Laterality: N/A;  . Tee without cardioversion N/A 08/22/2015    Procedure: TRANSESOPHAGEAL ECHOCARDIOGRAM (TEE);  Surgeon: Thayer Headings, MD;  Location: [facility] ENDOSCOPY;  Service: Cardiovascular;  Laterality: N/A;  Darden Dates with cardioversion    . Cardiac catheterization N/A 10/18/2015    Procedure: Right/Left Heart Cath and Coronary Angiography;  Surgeon: Sherren Mocha, MD;  Location: [place] CV LAB;  Service: Cardiovascular;  Laterality: N/A;  . Minimally invasive maze procedure N/A 10/31/2015    Procedure: MINIMALLY INVASIVE MAZE PROCEDURE;  Surgeon: Rexene Alberts, MD;  Location: [place];  Service: Open Heart Surgery;  Laterality: N/A;  . Tee without cardioversion N/A 10/31/2015    Procedure: TRANSESOPHAGEAL ECHOCARDIOGRAM (TEE);  Surgeon: Rexene Alberts, MD;  Location: [place];  Service: Open Heart Surgery;  Laterality: N/A;  . Mitral valve replacement Right 10/31/2015    Procedure: MINIMALLY INVASIVE MITRAL VALVE (MV) REPLACEMENT;  Surgeon: Rexene Alberts, MD;  Location: [place];  Service: Open Heart Surgery;  Laterality: Right;  . Ep implantable device N/A 11/06/2015    Procedure: Pacemaker Implant;  Surgeon: Evans Lance, MD;  Location: [place] CV LAB;  Service: Cardiovascular;  Laterality: N/A;   Family History  Problem Relation Age of Onset  . Hypertension Mother   . Arrhythmia Mother   . Heart failure Mother   . Arrhythmia Brother   . Stroke Brother 69    cerebral hemorrhage, nonsmoker, no HTN  .  Stroke Father     from an aneurysm  . Stroke Maternal Aunt 83    cerebral hemorrhage  . Liver cancer Maternal Grandmother   . Atrial fibrillation Son   . Heart attack Neg Hx    Social History   Social History  . Marital Status: Widowed    Spouse Name: Deceased  . Number of Children: N/A  . Years of Education: 68   Social History Main Topics  . Smoking status: Never Smoker   . Smokeless tobacco: Never Used  . Alcohol Use: No  . Drug Use: No  . Sexual Activity: No   Other Topics Concern  . None   Social History Narrative   She is a widow.  Husband died from metastatic renal cell cancer. Originally from [place]. Previously lived in [place] from 1975-[place]. Moved to  in 2007. No mold  exposure recently but did have it through a prior work exposure in 1993. Has a masters in public health. No bird exposure.      Objective:   Physical Exam BP 126/68 mmHg  Pulse 89  Ht 5\' 5"  (1.651 m)  Wt 143 lb 12.8 oz (65.227 kg)  BMI 23.93 kg/m2  SpO2 99%  LMP  (Exact Date) General:  Thin, frail female. No distress. Appears comfortable. Integument:  Warm & dry. No rash on exposed skin.  HEENT:  Moist mucus membranes. No scleral icterus. No nasal turbinate swelling. Cardiovascular: Mild bradycardia. Regular rhythm. No edema.  Pulmonary:  Clear bilaterally on auscultation. Normal work of breathing on room air. Symmetric chest wall expansion. Abdomen: Soft. Normal bowel sounds. Nontender. Musculoskeletal:  Normal bulk and tone. No joint deformity or effusion appreciated.  PFT 09/01/15: FVC 2.58 L (84%) FEV1 2.02 L (87%) FEV1/FVC 0.78 FEF 25-75 1.76 L (98%) no bronchodilator response TLC 3.82 L (71%) RV 54% ERV 195% DLCO uncorrected 83%   IMAGING CT CHEST W/O 02/05/16 (personally reviewed by me): 6 mm nodule in right lower lobe without sign of enlargement. No new nodule or opacity. No pleural effusion or thickening. No pathologic mediastinal adenopathy. No pericardial effusion.   CTA CHEST  10/27/15 (previously reviewed by me): Suggestion of hepatic cysts on CT imaging. 7 mm nodule within the sulcus of right lower lobe lateral subsegment. No developing nodule or opacity appreciated by me. No pleural effusion. No pathologic mediastinal adenopathy. No pulmonary embolus. Previously noted scarring persists. Calcified granuloma noted within right upper lobe still present.  CT CHEST W/O 08/15/15 (previously reviewed by me): Multiple cystic lesions within the liver. 6 mm nodule inferior lateral segment right lower lobe. Previously noted central opacification within right upper lobe has resolved. Patient's bilateral pleural effusions have largely resolved with a very tiny amount of pleural effusion still present on the right. There appears to be some slight decrease in the consolidation within the medial segment of the right middle lobe point compared with prior imaging. It's difficult to discern whether or not a lung nodule was present on previous imaging given the compression from prior pleural effusion.   CT CHEST W/ 07/31/15 (previously reviewed by me):  Bilateral pleural effusions right greater than left. No pathologic mediastinal adenopathy. Patchy opacity in the right middle lobe. With central opacification present within right upper lobe.  CARDIAC TTE (07/31/15): Difficult study. LV normal in size with mild concentric LVH. EF 60%. Unable to assess diastolic function due to atrial fibrillation. Normal regional function. LA & RA severely enlarged. RV normal in size and function. Pulmonary artery systolic pressure [id number] mmHg. IVC dilated. Aortic valve not well seen. Trace aortic regurgitation. Moderate mitral valve stenosis with mild regurgitation. Mild tricuspid regurgitation. No pulmonic stenosis or regurgitation. No pericardial effusion.  LABS 08/15/15 CBC: 6.3/12.7/38.4/203 BMP: 136/4.6/102/27/24/0.83/100/9.3 INR: 1.4    Assessment & Plan:  75 year old female with right lower lobe lung  nodule as well as pulmonary hypertension. Largely appears euvolemic at this time. She is going for placement of one of her pacemaker leads. Her lung nodule appears to remain stable and I reviewed her CT imaging with her during office visit today. Given her history of multiple exposures I feel he continued close monitoring for possible progression to malignancy is reasonable at this time. She has no symptoms of breathing problems at this time and I feel further testing is necessary with regards to her pulmonary hypertension and restrictive lung disease. I instructed the patient to  contact my office for any new breathing problems before her next appointment.  1. Right lower lobe nodule: Repeat CT chest without contrast September 2017 2. Mild restrictive lung disease: No evidence of interstitial lung disease. No need for further testing. 3. Pulmonary hypertension: Following with cardiology. Likely secondary to left ventricular dysfunction. 4. Follow-up: Patient to return to clinic in  September 2017 after her CT scan is completed.  Sonia Baller Ashok Cordia, M.D. [facility] Pulmonary & Critical Care Pager:  [phone] After 3pm or if no response, call [phone] 2:24 PM 02/12/2016

## 2016-02-12 NOTE — Patient Instructions (Signed)
   Call me if you have any new breathing problems before your next appointment.  I will see her back following her CT scan in September.  TESTS ORDERED: 1. CT chest without contrast September 2017

## 2016-02-14 ENCOUNTER — Ambulatory Visit (INDEPENDENT_AMBULATORY_CARE_PROVIDER_SITE_OTHER): Payer: Medicare Other

## 2016-02-14 DIAGNOSIS — Z8679 Personal history of other diseases of the circulatory system: Secondary | ICD-10-CM | POA: Diagnosis not present

## 2016-02-14 DIAGNOSIS — Z953 Presence of xenogenic heart valve: Secondary | ICD-10-CM

## 2016-02-14 DIAGNOSIS — Z9889 Other specified postprocedural states: Secondary | ICD-10-CM

## 2016-02-14 LAB — POCT INR: INR: 1.8

## 2016-02-15 ENCOUNTER — Encounter: Payer: Self-pay | Admitting: Internal Medicine

## 2016-02-15 ENCOUNTER — Ambulatory Visit (INDEPENDENT_AMBULATORY_CARE_PROVIDER_SITE_OTHER): Payer: Medicare Other | Admitting: Internal Medicine

## 2016-02-15 ENCOUNTER — Telehealth: Payer: Self-pay | Admitting: Internal Medicine

## 2016-02-15 VITALS — BP 108/78 | HR 80 | Temp 97.7°F | Resp 12 | Ht 65.0 in | Wt 143.0 lb

## 2016-02-15 DIAGNOSIS — R739 Hyperglycemia, unspecified: Secondary | ICD-10-CM

## 2016-02-15 DIAGNOSIS — K764 Peliosis hepatis: Secondary | ICD-10-CM | POA: Diagnosis not present

## 2016-02-15 DIAGNOSIS — E559 Vitamin D deficiency, unspecified: Secondary | ICD-10-CM

## 2016-02-15 DIAGNOSIS — E785 Hyperlipidemia, unspecified: Secondary | ICD-10-CM | POA: Diagnosis not present

## 2016-02-15 DIAGNOSIS — M810 Age-related osteoporosis without current pathological fracture: Secondary | ICD-10-CM

## 2016-02-15 DIAGNOSIS — R7303 Prediabetes: Secondary | ICD-10-CM | POA: Insufficient documentation

## 2016-02-15 LAB — HEMOGLOBIN A1C: Hgb A1c MFr Bld: 6 % (ref 4.6–6.5)

## 2016-02-15 LAB — COMPREHENSIVE METABOLIC PANEL
ALT: 18 U/L (ref 0–35)
AST: 26 U/L (ref 0–37)
Albumin: 4.2 g/dL (ref 3.5–5.2)
Alkaline Phosphatase: 77 U/L (ref 39–117)
BUN: 22 mg/dL (ref 6–23)
CO2: 29 mEq/L (ref 19–32)
Calcium: 9.2 mg/dL (ref 8.4–10.5)
Chloride: 104 mEq/L (ref 96–112)
Creatinine, Ser: 0.96 mg/dL (ref 0.40–1.20)
GFR: 60.17 mL/min (ref >60.00)
Glucose, Bld: 94 mg/dL (ref 70–99)
Potassium: 4.1 mEq/L (ref 3.5–5.1)
Sodium: 140 mEq/L (ref 135–145)
Total Bilirubin: 0.4 mg/dL (ref 0.2–1.2)
Total Protein: 6.4 g/dL (ref 6.0–8.3)

## 2016-02-15 LAB — LDL CHOLESTEROL, DIRECT: Direct LDL: 113 mg/dL

## 2016-02-15 LAB — LIPID PANEL
Cholesterol: 200 mg/dL (ref 0–200)
HDL: 57.6 mg/dL (ref >39.00)
LDL Cholesterol: 126 mg/dL — ABNORMAL HIGH (ref 0–99)
NonHDL: 142.51
Total CHOL/HDL Ratio: 3
Triglycerides: 83 mg/dL (ref 0.0–149.0)
VLDL: 16.6 mg/dL (ref 0.0–40.0)

## 2016-02-15 LAB — VITAMIN D 25 HYDROXY (VIT D DEFICIENCY, FRACTURES): VITD: 25.11 ng/mL — ABNORMAL LOW (ref 30.00–100.00)

## 2016-02-15 NOTE — Telephone Encounter (Signed)
Need PA for Prolia patient due injection in April.

## 2016-02-15 NOTE — Progress Notes (Signed)
Subjective:  Patient ID: [name] [name], female    DOB: 12-Feb-1941  Age: 75 y.o. 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 Diagnoses of Hyperglycemia, Vitamin D deficiency, Hyperlipidemia, and Osteoporosis of forearm were also pertinent to this visit.  HPI [name] presents for follow up on  Multiple chronic issues  Including  Joint pain, atrial fibrillation s/p maze procedure and pacemaker implantation since last visit.    She has not been able to resume exercise due to Recent placement of pacemaker , and subsequent complication.   Needs wire replaced next week.  Chest wall still tender.   She has been participating in the hospital's Cardiopulmonary rehab  Program and will be  Finishing this month  Joints in hand bothering her  A lot.  particlarly her left thumb.  Has been offered  surgery but she has declined bc it would be fusion.  The thumb is Bothered by drriving,  Drying her hair,  ltos of kitchen activities.  Using arthritis gloves   Asking to delay the mammogram for 6 months   Camera trip postponed due to weight of camera and impending pacemaker lead replacment    MVP s/p MVR , bioengineered valve and atrial fib s/p Maze procedure and pacemaker . On coumadin,  Hoping to be transitioned to another  Form of anticoagulation . Coumadin precluding use of excedrin and ibuprofen.  She has been afraid to resume a healthy  diet bc her cardiologist is having difficulty getting her to a therapeutic INRl  Using fioricet for control of headaches  . Marland Kitchen   Liver cysts..  Recent CT scan of chest noted innumerable liver cysts of uncertain significance.  She has no history of granulomatous disease,  Parasitic infection, or other forms of liver disease, but does have a pulmonary nodule that is being followed.    Outpatient Prescriptions Prior to Visit  Medication Sig Dispense Refill  . albuterol (PROVENTIL HFA;VENTOLIN HFA) 108 (90 BASE) MCG/ACT inhaler  Inhale 2 puffs into the lungs every 6 (six) hours as needed for wheezing or shortness of breath. 1 Inhaler 2  . ALPRAZolam (XANAX) 0.25 MG tablet Take 1 tablet (0.25 mg total) by mouth at bedtime as needed for anxiety or sleep. 30 tablet 5  . amoxicillin (AMOXIL) 500 MG tablet Take 4 tablets (2,000 mg total) by mouth once. Take 4 tablets 60 minutes prior to dental work 30 tablet 1  . azelastine (ASTELIN) 0.1 % nasal spray Place 1 spray into both nostrils as needed for allergies.   5  . butalbital-acetaminophen-caffeine (FIORICET, ESGIC) 50-325-40 MG tablet TAKE 1 TABLET BY MOUTH AS NEEDED FOR BACK PAIN OR HEADACHE 90 tablet 2  . furosemide (LASIX) 40 MG tablet Take 40 mg by mouth daily as needed for fluid.    . metoprolol tartrate (LOPRESSOR) 25 MG tablet Take 0.5 tablets (12.5 mg total) by mouth 2 (two) times daily. 90 tablet 3  . montelukast (SINGULAIR) 10 MG tablet Take 10 mg by mouth daily.     . rosuvastatin (CRESTOR) 5 MG tablet Take 1 tablet (5 mg total) by mouth every other day. 45 tablet 3  . traMADol (ULTRAM) 50 MG tablet Take 1-2 tablets (50-100 mg total) by mouth every 4 (four) hours as needed for moderate pain. 30 tablet 0  . warfarin (COUMADIN) 1 MG tablet Take 1 tablet (1 mg total) by mouth daily. 90 tablet 3  . warfarin (COUMADIN) 2 MG tablet Take 1 tablet (2 mg  total) by mouth as directed. 30 tablet 3   No facility-administered medications prior to visit.    Review of Systems;  Patient denies headache, fevers, malaise, unintentional weight loss, skin rash, eye pain, sinus congestion and sinus pain, sore throat, dysphagia,  hemoptysis , cough, dyspnea, wheezing, chest pain, palpitations, orthopnea, edema, abdominal pain, nausea, melena, diarrhea, constipation, flank pain, dysuria, hematuria, urinary  Frequency, nocturia, numbness, tingling, seizures,  Focal weakness, Loss of consciousness,  Tremor, insomnia, depression, anxiety, and suicidal ideation.      Objective:  BP 108/78  mmHg  Pulse 80  Temp(Src) 97.7 F (36.5 C) (Oral)  Resp 12  Ht 5\' 5"  (1.651 m)  Wt 143 lb (64.864 kg)  BMI 23.80 kg/m2  SpO2 99%  LMP  (Exact Date)  BP Readings from Last 3 Encounters:  02/15/16 108/78  02/12/16 126/68  02/08/16 110/70    Wt Readings from Last 3 Encounters:  02/15/16 143 lb (64.864 kg)  02/12/16 143 lb 12.8 oz (65.227 kg)  02/08/16 144 lb 9.6 oz (65.59 kg)    General appearance: alert, cooperative and appears stated age. Slightly anxious Ears: normal TM's and external ear canals both ears Throat: lips, mucosa, and tongue normal; teeth and gums normal Neck: no adenopathy, no carotid bruit, supple, symmetrical, trachea midline and thyroid not enlarged, symmetric, no tenderness/mass/nodules Chest wall:  Pacemaker bulge present,  No redness or warmth to area.  Back: symmetric, no curvature. ROM normal. No CVA tenderness. Lungs: clear to auscultation bilaterally Heart: regular rate and rhythm, S1, S2 normal, no murmur, click, rub or gallop Abdomen: soft, non-tender; bowel sounds normal; no masses,  no organomegaly Pulses: 2+ and symmetric Skin: Skin color, texture, turgor normal. No rashes or lesions Lymph nodes: Cervical, supraclavicular, and axillary nodes normal.  Lab Results  Component Value Date   HGBA1C 6.0 02/15/2016   HGBA1C 5.7* 10/27/2015    Lab Results  Component Value Date   CREATININE 0.96 02/15/2016   CREATININE 0.88 02/08/2016   CREATININE 0.70 11/08/2015    Lab Results  Component Value Date   WBC 4.3 02/08/2016   HGB 10.9* 02/08/2016   HCT 34.5* 02/08/2016   PLT 157 02/08/2016   GLUCOSE 94 02/15/2016   CHOL 200 02/15/2016   TRIG 83.0 02/15/2016   HDL 57.60 02/15/2016   LDLDIRECT 113.0 02/15/2016   LDLCALC 126* 02/15/2016   ALT 18 02/15/2016   AST 26 02/15/2016   NA 140 02/15/2016   K 4.1 02/15/2016   CL 104 02/15/2016   CREATININE 0.96 02/15/2016   BUN 22 02/15/2016   CO2 29 02/15/2016   TSH 2.12 08/01/2014   INR 1.8  02/14/2016   HGBA1C 6.0 02/15/2016    Ct Chest Wo Contrast  02/05/2016  CLINICAL DATA:  Followup pulmonary nodule EXAM: CT CHEST WITHOUT CONTRAST TECHNIQUE: Multidetector CT imaging of the chest was performed following the standard protocol without IV contrast. COMPARISON:  08/15/2015, 10/27/2015. FINDINGS: Mediastinum/Nodes: Left pacer remains in place, unchanged. Prior mitral valve replacement. Mild cardiomegaly. Aorta is normal caliber. No mediastinal, hilar, or axillary adenopathy. Lungs/Pleura: Areas of scarring in the right upper lobe, right middle lobe and lingula. Small peripheral nodule at the right lung base measuring 5-6 mm, stable. No new or enlarging pulmonary nodules. No pleural effusions. Upper abdomen: Imaging into the upper abdomen shows no acute findings. Numerous hepatic cysts again noted which appear stable. Musculoskeletal: Chest wall soft tissues are unremarkable. No acute bony abnormality. IMPRESSION: Stable right basilar nodule. This could be followed with  repeat CT in 12 months. Stable areas of scarring in the lungs. Mild cardiomegaly. Electronically Signed   By: Rolm Baptise M.D.   On: 02/05/2016 11:15    Assessment & Plan:   Problem List Items Addressed This Visit    Osteoporosis of forearm    Continue semi annual Prolia, weight bearing exercise and adequate Vit D/calcium.   PA in process for next dose due in April       Peliosis hepatis - Primary    Suggest by recent CT scan .  Referral to  GI in process.  Lab Results  Component Value Date   ALT 18 02/15/2016   AST 26 02/15/2016   ALKPHOS 77 02/15/2016   BILITOT 0.4 02/15/2016         Relevant Orders   Comprehensive metabolic panel (Completed)   Hyperglycemia    Now with A1c of 6.0, suggestive of Type 2 DM .  Will have her follow a low GI diet .  She is actually underweight. return in 3 months  .Body mass index is 23.8 kg/(m^2).       Relevant Orders   Hemoglobin A1c (Completed)   Hyperlipidemia  (Chronic)    Increasing Crestor to 10 mg daily given new diagnosis of DM.   Lab Results  Component Value Date   CHOL 200 02/15/2016   HDL 57.60 02/15/2016   LDLCALC 126* 02/15/2016   LDLDIRECT 113.0 02/15/2016   TRIG 83.0 02/15/2016   CHOLHDL 3 02/15/2016   Lab Results  Component Value Date   ALT 18 02/15/2016   AST 26 02/15/2016   ALKPHOS 77 02/15/2016   BILITOT 0.4 02/15/2016         Relevant Orders   LDL cholesterol, direct (Completed)   Lipid panel (Completed)    Other Visit Diagnoses    Vitamin D deficiency        Relevant Orders    VITAMIN D 25 Hydroxy (Vit-D Deficiency, Fractures) (Completed)         A total of 40 minutes was spent with patient more than half of which was spent in counseling patient on the above mentioned issues , reviewing and explaining recent labs and imaging studies done, and coordination of care. I am having Ms. Hazard maintain her montelukast, rosuvastatin, albuterol, ALPRAZolam, azelastine, traMADol, warfarin, furosemide, amoxicillin, metoprolol tartrate, warfarin, and butalbital-acetaminophen-caffeine.  No orders of the defined types were placed in this encounter.    There are no discontinued medications.  Follow-up: Return in about 6 months (around 08/17/2016).   [name], MD

## 2016-02-15 NOTE — Patient Instructions (Signed)
Referral to Dr Hilarie Fredrickson is under way for evaluation of your liver cysts   prolia authorization is in process  We will call you when both issues have been handled

## 2016-02-15 NOTE — Progress Notes (Signed)
Pre-visit discussion using our clinic review tool. No additional management support is needed unless otherwise documented below in the visit note.  

## 2016-02-16 NOTE — Telephone Encounter (Signed)
FYI

## 2016-02-16 NOTE — Telephone Encounter (Signed)
I have electronically submitted pt's info for Prolia insurance verification and will notify you once I have a response. Thank you. °

## 2016-02-17 ENCOUNTER — Encounter: Payer: Self-pay | Admitting: Internal Medicine

## 2016-02-17 NOTE — Assessment & Plan Note (Addendum)
Now with A1c of 6.0, suggestive of Type 2 DM .  Will have her follow a low GI diet .  She is actually underweight. return in 3 months  .Body mass index is 23.8 kg/(m^2).

## 2016-02-17 NOTE — Assessment & Plan Note (Signed)
Increasing Crestor to 10 mg daily given new diagnosis of DM.   Lab Results  Component Value Date   CHOL 200 02/15/2016   HDL 57.60 02/15/2016   LDLCALC 126* 02/15/2016   LDLDIRECT 113.0 02/15/2016   TRIG 83.0 02/15/2016   CHOLHDL 3 02/15/2016   Lab Results  Component Value Date   ALT 18 02/15/2016   AST 26 02/15/2016   ALKPHOS 77 02/15/2016   BILITOT 0.4 02/15/2016

## 2016-02-18 NOTE — Assessment & Plan Note (Addendum)
Continue semi annual Prolia, weight bearing exercise and adequate Vit D/calcium.   PA in process for next dose due in April

## 2016-02-18 NOTE — Assessment & Plan Note (Signed)
Suggest by recent CT scan .  Referral to [facility] in process.  Lab Results  Component Value Date   ALT 18 02/15/2016   AST 26 02/15/2016   ALKPHOS 77 02/15/2016   BILITOT 0.4 02/15/2016

## 2016-02-19 ENCOUNTER — Telehealth: Payer: Self-pay | Admitting: Internal Medicine

## 2016-02-19 ENCOUNTER — Ambulatory Visit (INDEPENDENT_AMBULATORY_CARE_PROVIDER_SITE_OTHER): Payer: Medicare Other | Admitting: Thoracic Surgery (Cardiothoracic Vascular Surgery)

## 2016-02-19 ENCOUNTER — Other Ambulatory Visit: Payer: Self-pay

## 2016-02-19 ENCOUNTER — Other Ambulatory Visit: Payer: Self-pay | Admitting: *Deleted

## 2016-02-19 ENCOUNTER — Other Ambulatory Visit: Payer: Self-pay | Admitting: Internal Medicine

## 2016-02-19 ENCOUNTER — Encounter: Payer: Self-pay | Admitting: Thoracic Surgery (Cardiothoracic Vascular Surgery)

## 2016-02-19 VITALS — BP 105/67 | HR 87 | Resp 16 | Ht 66.0 in | Wt 138.5 lb

## 2016-02-19 DIAGNOSIS — Z953 Presence of xenogenic heart valve: Secondary | ICD-10-CM

## 2016-02-19 DIAGNOSIS — Z8679 Personal history of other diseases of the circulatory system: Secondary | ICD-10-CM | POA: Diagnosis not present

## 2016-02-19 DIAGNOSIS — I059 Rheumatic mitral valve disease, unspecified: Secondary | ICD-10-CM

## 2016-02-19 DIAGNOSIS — Z9889 Other specified postprocedural states: Secondary | ICD-10-CM | POA: Diagnosis not present

## 2016-02-19 MED ORDER — ROSUVASTATIN CALCIUM 10 MG PO TABS: 5.0000 mg | ORAL_TABLET | ORAL | Status: DC

## 2016-02-19 MED ORDER — ROSUVASTATIN CALCIUM 10 MG PO TABS: 10.0000 mg | ORAL_TABLET | Freq: Every day | ORAL | Status: DC

## 2016-02-19 NOTE — Patient Instructions (Addendum)
Continue all previous medications without any changes at this time  You may resume unrestricted physical activity without any particular limitations at this time, other than those as per Dr. Lovena Le with regards to your pacemaker  Endocarditis is a potentially serious infection of heart valves or inside lining of the heart.  It occurs more commonly in patients with diseased heart valves (such as patient's with aortic or mitral valve disease) and in patients who have undergone heart valve repair or replacement.  Certain surgical and dental procedures may put you at risk, such as dental cleaning, other dental procedures, or any surgery involving the respiratory, urinary, gastrointestinal tract, gallbladder or prostate gland.   To minimize your chances for develooping endocarditis, maintain good oral health and seek prompt medical attention for any infections involving the mouth, teeth, gums, skin or urinary tract.    Always notify your doctor or dentist about your underlying heart valve condition before having any invasive procedures. You will need to take antibiotics before certain procedures, including all routine dental cleanings or other dental procedures.  Your cardiologist or dentist should prescribe these antibiotics for you to be taken ahead of time.     Schedule routine follow up ECHO at Dr Elmarie Shiley office as soon as [employer] streamer

## 2016-02-19 NOTE — Telephone Encounter (Signed)
Left a VM to have patient return a call to the office to review insurance estimates and schedule injection.

## 2016-02-19 NOTE — Telephone Encounter (Signed)
New message     Pt is having a pacemaker wire changeout.  Should pt take her antibiotic prior to the procedure?  Please call

## 2016-02-19 NOTE — Telephone Encounter (Signed)
I have rec'd [name] [name]' insurance verification for Prolia and she has an estimated responsibility of 0%.  Please make pt aware this is an estimate and we will not know an exact amt until insurance(s) has/have paid.  I have sent a copy of the summary of benefits to be scanned into pt's chart.    Once pt recs injection, please let me know actual injection date so I can update the Prolia portal.  If you have any questions, please let me know.  Thank you.

## 2016-02-19 NOTE — Telephone Encounter (Signed)
Direction were wrong in the pharmacy for the crestor. It was called in with the right directions

## 2016-02-19 NOTE — Telephone Encounter (Signed)
Spoke with patient, scheduled Prolia injection for 4/18 at 815am.  Ordered injection in [facility].

## 2016-02-19 NOTE — Progress Notes (Signed)
[address]       [place] [address]             [phone]     CARDIOTHORACIC SURGERY OFFICE NOTE  Referring Provider is Nahser, Wonda Cheng, MD PCP is [name], MD   HPI:  Patient returns for routine follow-up within 3 months status post minimally invasive mitral valve replacement using a bioprosthetic tissue valve with maze procedure on 10/31/2015 for severe symptomatic primary mitral regurgitation with recurrent persistent atrial fibrillation. Her early postoperative recovery was notable for severe bradycardia which persisted, ultimately requiring permanent pacemaker placement by Dr. Lovena Le on 11/06/2015. She was last seen here in our office on 12/11/2015 at which time she was recovering uneventfully.  Since then she has continued to do very well from a cardiac standpoint, although her permanent pacemaker has been found to demonstrate progressively elevated pacing thresholds on the atrial lead and the patient has been scheduled for atrial lead replacement by Dr. Lovena Le later this week. Clinically the patient has done remarkably well. She completed the outpatient cardiac rehabilitation program and has recently begun the continued maintenance exercise program. She states that shortly after the first of February she began to feel much better. She states that her exercise tolerance and breathing capacity is notably much better than it was prior to surgery. She has not had any significant exertional shortness of breath. She has not had any palpitations or dizzy spells to suggest a recurrence of atrial fibrillation.  She no longer has any significant soreness in her right chest although she states that she still has numbness along the surgical incision in her right breast remains somewhat tender to the touch. She has had problems with managing her anticoagulation using warfarin and has been frustrated by the inconsistency. She is also very frustrated because she was  recently told by Dr. Derrel Nip that she has diabetes. The patient is convinced that she would not have diabetes if it were not for the fact that she has to adjust her diet because of being on warfarin.   Current Outpatient Prescriptions  Medication Sig Dispense Refill  . ALPRAZolam (XANAX) 0.25 MG tablet Take 1 tablet (0.25 mg total) by mouth at bedtime as needed for anxiety or sleep. 30 tablet 5  . amoxicillin (AMOXIL) 500 MG tablet Take 4 tablets (2,000 mg total) by mouth once. Take 4 tablets 60 minutes prior to dental work 30 tablet 1  . azelastine (ASTELIN) 0.1 % nasal spray Place 1 spray into both nostrils as needed for allergies.   5  . butalbital-acetaminophen-caffeine (FIORICET, ESGIC) 50-325-40 MG tablet TAKE 1 TABLET BY MOUTH AS NEEDED FOR BACK PAIN OR HEADACHE 90 tablet 2  . furosemide (LASIX) 40 MG tablet Take 40 mg by mouth daily as needed for fluid.    . metoprolol tartrate (LOPRESSOR) 25 MG tablet Take 0.5 tablets (12.5 mg total) by mouth 2 (two) times daily. 90 tablet 3  . montelukast (SINGULAIR) 10 MG tablet Take 10 mg by mouth daily.     . rosuvastatin (CRESTOR) 5 MG tablet Take 1 tablet (5 mg total) by mouth every other day. 45 tablet 3  . traMADol (ULTRAM) 50 MG tablet Take 1-2 tablets (50-100 mg total) by mouth every 4 (four) hours as needed for moderate pain. 30 tablet 0  . warfarin (COUMADIN) 1 MG tablet Take 1 tablet (1 mg total) by mouth daily. 90 tablet 3  . warfarin (COUMADIN) 2 MG tablet Take 1  tablet (2 mg total) by mouth as directed. 30 tablet 3  . albuterol (PROVENTIL HFA;VENTOLIN HFA) 108 (90 BASE) MCG/ACT inhaler Inhale 2 puffs into the lungs every 6 (six) hours as needed for wheezing or shortness of breath. 1 Inhaler 2   No current facility-administered medications for this visit.      Physical Exam:   BP 105/67 mmHg  Pulse 87  Resp 16  Ht 5\' 6"  (1.676 m)  Wt 138 lb 8 oz (62.823 kg)  BMI 22.37 kg/m2  SpO2 95%  LMP  (Exact  Date)  General:  Well-appearing  Chest:   Clear to auscultation  CV:   Regular rate and rhythm with soft systolic murmur heard at the left sternal border  Incisions:  Completely healed  Abdomen:  Soft nontender  Extremities:  Warm and well-perfused  Diagnostic Tests:  2 channel telemetry rhythm strip demonstrates paced rhythm   Impression:  Patient is doing very well more than 3 months status post minimally invasive mitral valve replacement using a bioprosthetic tissue valve with Maze procedure.  The patient's exercise tolerance is quite good and she denies any symptoms of exertional shortness of breath.  She states that she feels better than she did during the last several months prior to her surgery.  She has not had a follow-up echocardiogram performed since her surgery.  Plan:  We will request that a routine follow-up echocardiogram be performed at Dr. Elmarie Shiley office. We have not recommended any changes to the patient's current medications. The patient has been advised that she can resume unrestricted physical activity other than as she will be instructed by Dr. Lovena Le following pacemaker lead revision. We will defer whether or not the patient will need to remain on long-term anticoagulation for atrial fibrillation to the discretion of Dr. Acie Fredrickson, Dr. Lovena Le, and colleages in the atrial fibrillation clinic. Her CHADS-VASC score is at least 3 but her LA appendage has been clipped.  Her mitral valve was replaced using a bioprosthetic tissue valve. Use of a NOAC could be considered as an alternative.  The patient has been reminded regarding the importance of dental hygiene and the lifelong need for antibiotic prophylaxis for all dental cleanings and other related invasive procedures.We will have the patient return for routine follow-up next November, approximately 1 year following her original surgery. She will call and return sooner should specific problems or difficulties arise.   I spent  in excess of 15 minutes during the conduct of this office consultation and >50% of this time involved direct face-to-face encounter with the patient for counseling and/or coordination of their care.    Valentina Gu. Roxy Manns, MD 02/19/2016 11:28 AM

## 2016-02-19 NOTE — Telephone Encounter (Signed)
Spoke with [name] [name]. I advised her that they will give antibiotic before/during procedure if ordered by physician. She verbalizes understanding.

## 2016-02-20 ENCOUNTER — Ambulatory Visit (HOSPITAL_COMMUNITY)
Admission: RE | Admit: 2016-02-20 | Discharge: 2016-02-21 | Disposition: A | Discharge status: Home / Self Care | Payer: Medicare Other | Source: Ambulatory Visit | Attending: Internal Medicine | Admitting: Internal Medicine

## 2016-02-20 ENCOUNTER — Encounter (HOSPITAL_COMMUNITY): Payer: Self-pay | Admitting: General Practice

## 2016-02-20 ENCOUNTER — Encounter (HOSPITAL_COMMUNITY)
Admission: RE | Disposition: A | Discharge status: Home / Self Care | Payer: Self-pay | Source: Ambulatory Visit | Attending: Internal Medicine

## 2016-02-20 DIAGNOSIS — E785 Hyperlipidemia, unspecified: Secondary | ICD-10-CM | POA: Insufficient documentation

## 2016-02-20 DIAGNOSIS — I5032 Chronic diastolic (congestive) heart failure: Secondary | ICD-10-CM | POA: Diagnosis not present

## 2016-02-20 DIAGNOSIS — Z8249 Family history of ischemic heart disease and other diseases of the circulatory system: Secondary | ICD-10-CM | POA: Insufficient documentation

## 2016-02-20 DIAGNOSIS — Z7901 Long term (current) use of anticoagulants: Secondary | ICD-10-CM | POA: Diagnosis not present

## 2016-02-20 DIAGNOSIS — Y713 Surgical instruments, materials and cardiovascular devices (including sutures) associated with adverse incidents: Secondary | ICD-10-CM | POA: Diagnosis not present

## 2016-02-20 DIAGNOSIS — D693 Immune thrombocytopenic purpura: Secondary | ICD-10-CM | POA: Diagnosis not present

## 2016-02-20 DIAGNOSIS — I4510 Unspecified right bundle-branch block: Secondary | ICD-10-CM | POA: Insufficient documentation

## 2016-02-20 DIAGNOSIS — Z952 Presence of prosthetic heart valve: Secondary | ICD-10-CM | POA: Diagnosis not present

## 2016-02-20 DIAGNOSIS — I481 Persistent atrial fibrillation: Secondary | ICD-10-CM | POA: Diagnosis not present

## 2016-02-20 DIAGNOSIS — I110 Hypertensive heart disease with heart failure: Secondary | ICD-10-CM | POA: Insufficient documentation

## 2016-02-20 DIAGNOSIS — T82110A Breakdown (mechanical) of cardiac electrode, initial encounter: Secondary | ICD-10-CM | POA: Diagnosis present

## 2016-02-20 DIAGNOSIS — I471 Supraventricular tachycardia: Secondary | ICD-10-CM | POA: Diagnosis not present

## 2016-02-20 HISTORY — PX: EP IMPLANTABLE DEVICE: SHX172B

## 2016-02-20 HISTORY — PX: PACEMAKER LEAD REMOVAL: SHX5064

## 2016-02-20 LAB — SURGICAL PCR SCREEN
MRSA, PCR: NEGATIVE
Staphylococcus aureus: NEGATIVE

## 2016-02-20 LAB — PROTIME-INR
INR: 1.74 — ABNORMAL HIGH (ref 0.00–1.49)
Prothrombin Time: 20.3 seconds — ABNORMAL HIGH (ref 11.6–15.2)

## 2016-02-20 SURGERY — LEAD EXTRACTION

## 2016-02-20 MED ORDER — SODIUM CHLORIDE 0.9 % IR SOLN
Status: AC
  Filled 2016-02-20: qty 2

## 2016-02-20 MED ORDER — ROSUVASTATIN CALCIUM 10 MG PO TABS
10.0000 mg | ORAL_TABLET | Freq: Every day | ORAL | Status: DC
  Administered 2016-02-20 – 2016-02-21 (×2): 10 mg via ORAL
  Filled 2016-02-20 (×2): qty 1

## 2016-02-20 MED ORDER — MIDAZOLAM HCL 5 MG/5ML IJ SOLN
INTRAMUSCULAR | Status: AC
  Filled 2016-02-20: qty 5

## 2016-02-20 MED ORDER — ACETAMINOPHEN 325 MG PO TABS: 325.0000 mg | ORAL_TABLET | ORAL | Status: DC | PRN

## 2016-02-20 MED ORDER — WARFARIN - PHYSICIAN DOSING INPATIENT: Freq: Every day | Status: DC

## 2016-02-20 MED ORDER — MUPIROCIN 2 % EX OINT
1.0000 "application " | TOPICAL_OINTMENT | Freq: Once | CUTANEOUS | Status: AC
  Administered 2016-02-20: 1 via TOPICAL
  Filled 2016-02-20: qty 22

## 2016-02-20 MED ORDER — SODIUM CHLORIDE 0.9 % IV SOLN
INTRAVENOUS | Status: DC
  Administered 2016-02-20: 07:00:00 via INTRAVENOUS

## 2016-02-20 MED ORDER — AMOXICILLIN 500 MG PO TABS: 2000.0000 mg | ORAL_TABLET | Freq: Once | ORAL | Status: DC

## 2016-02-20 MED ORDER — WARFARIN SODIUM 4 MG PO TABS: 4.0000 mg | ORAL_TABLET | ORAL | Status: DC

## 2016-02-20 MED ORDER — CHLORHEXIDINE GLUCONATE 4 % EX LIQD
60.0000 mL | Freq: Once | CUTANEOUS | Status: DC
  Filled 2016-02-20: qty 60

## 2016-02-20 MED ORDER — LIDOCAINE HCL (PF) 1 % IJ SOLN
INTRAMUSCULAR | Status: AC
  Filled 2016-02-20: qty 60

## 2016-02-20 MED ORDER — CEFAZOLIN SODIUM-DEXTROSE 2-3 GM-% IV SOLR
INTRAVENOUS | Status: AC
  Filled 2016-02-20: qty 50

## 2016-02-20 MED ORDER — BUTALBITAL-APAP-CAFFEINE 50-325-40 MG PO TABS: 1.0000 | ORAL_TABLET | ORAL | Status: DC | PRN

## 2016-02-20 MED ORDER — CEFAZOLIN SODIUM-DEXTROSE 2-3 GM-% IV SOLR
2.0000 g | INTRAVENOUS | Status: AC
  Administered 2016-02-20: 2 g via INTRAVENOUS

## 2016-02-20 MED ORDER — METOPROLOL TARTRATE 12.5 MG HALF TABLET
12.5000 mg | ORAL_TABLET | Freq: Two times a day (BID) | ORAL | Status: DC
  Administered 2016-02-20 – 2016-02-21 (×3): 12.5 mg via ORAL
  Filled 2016-02-20 (×3): qty 1

## 2016-02-20 MED ORDER — WARFARIN SODIUM 1 MG PO TABS: 1.0000 mg | ORAL_TABLET | Freq: Every day | ORAL | Status: DC

## 2016-02-20 MED ORDER — ALBUTEROL SULFATE (2.5 MG/3ML) 0.083% IN NEBU: 2.5000 mg | INHALATION_SOLUTION | Freq: Four times a day (QID) | RESPIRATORY_TRACT | Status: DC | PRN

## 2016-02-20 MED ORDER — WARFARIN SODIUM 3 MG PO TABS
3.0000 mg | ORAL_TABLET | ORAL | Status: DC
  Administered 2016-02-20: 3 mg via ORAL
  Filled 2016-02-20: qty 1

## 2016-02-20 MED ORDER — TRAMADOL HCL 50 MG PO TABS: 50.0000 mg | ORAL_TABLET | ORAL | Status: DC | PRN

## 2016-02-20 MED ORDER — SODIUM CHLORIDE 0.9 % IR SOLN
80.0000 mg | Status: AC
  Administered 2016-02-20: 80 mg

## 2016-02-20 MED ORDER — ALPRAZOLAM 0.25 MG PO TABS: 0.2500 mg | ORAL_TABLET | Freq: Every evening | ORAL | Status: DC | PRN

## 2016-02-20 MED ORDER — FUROSEMIDE 40 MG PO TABS: 40.0000 mg | ORAL_TABLET | Freq: Every day | ORAL | Status: DC | PRN

## 2016-02-20 MED ORDER — WARFARIN SODIUM 2 MG PO TABS: 2.0000 mg | ORAL_TABLET | ORAL | Status: DC

## 2016-02-20 MED ORDER — FENTANYL CITRATE (PF) 100 MCG/2ML IJ SOLN
INTRAMUSCULAR | Status: AC
Start: 2016-02-20 — End: 2016-02-20
  Filled 2016-02-20: qty 2

## 2016-02-20 MED ORDER — MONTELUKAST SODIUM 10 MG PO TABS
10.0000 mg | ORAL_TABLET | Freq: Every day | ORAL | Status: DC
  Administered 2016-02-20: 10 mg via ORAL
  Filled 2016-02-20: qty 1

## 2016-02-20 MED ORDER — HEPARIN (PORCINE) IN NACL 2-0.9 UNIT/ML-% IJ SOLN
INTRAMUSCULAR | Status: AC
  Filled 2016-02-20: qty 500

## 2016-02-20 MED ORDER — VITAMIN D 1000 UNITS PO TABS
2000.0000 [IU] | ORAL_TABLET | Freq: Every day | ORAL | Status: DC
  Administered 2016-02-20 – 2016-02-21 (×2): 2000 [IU] via ORAL
  Filled 2016-02-20 (×2): qty 2

## 2016-02-20 MED ORDER — LIDOCAINE HCL (PF) 1 % IJ SOLN
INTRAMUSCULAR | Status: DC | PRN
  Administered 2016-02-20: 45 mL

## 2016-02-20 MED ORDER — CEFAZOLIN SODIUM 1-5 GM-% IV SOLN
1.0000 g | Freq: Four times a day (QID) | INTRAVENOUS | Status: AC
  Administered 2016-02-20 – 2016-02-21 (×3): 1 g via INTRAVENOUS
  Filled 2016-02-20 (×3): qty 50

## 2016-02-20 MED ORDER — ONDANSETRON HCL 4 MG/2ML IJ SOLN: 4.0000 mg | Freq: Four times a day (QID) | INTRAMUSCULAR | Status: DC | PRN

## 2016-02-20 MED ORDER — MUPIROCIN 2 % EX OINT
TOPICAL_OINTMENT | CUTANEOUS | Status: AC
  Administered 2016-02-20: 1 via TOPICAL
  Filled 2016-02-20: qty 22

## 2016-02-20 MED ORDER — FENTANYL CITRATE (PF) 100 MCG/2ML IJ SOLN
INTRAMUSCULAR | Status: DC | PRN
  Administered 2016-02-20: 25 ug via INTRAVENOUS

## 2016-02-20 MED ORDER — SODIUM CHLORIDE 0.9 % IV SOLN
INTRAVENOUS | Status: DC | PRN
  Administered 2016-02-20: 10 mL/h via INTRAVENOUS

## 2016-02-20 MED ORDER — AZELASTINE HCL 0.1 % NA SOLN: 1.0000 | NASAL | Status: DC | PRN

## 2016-02-20 MED ORDER — MIDAZOLAM HCL 5 MG/5ML IJ SOLN
INTRAMUSCULAR | Status: DC | PRN
  Administered 2016-02-20 (×3): 1 mg via INTRAVENOUS

## 2016-02-20 MED ORDER — HEPARIN (PORCINE) IN NACL 2-0.9 UNIT/ML-% IJ SOLN
INTRAMUSCULAR | Status: DC | PRN
  Administered 2016-02-20: 10:00:00

## 2016-02-20 SURGICAL SUPPLY — 6 items
CABLE SURGICAL S-101-97-12 (CABLE) ×2 IMPLANT
LEAD CAPSURE NOVUS 45CM (Lead) ×2 IMPLANT
PAD DEFIB LIFELINK (PAD) ×2 IMPLANT
SET INTRODUCER MICROPUNCT 5F (INTRODUCER) ×2 IMPLANT
SHEATH CLASSIC 7F (SHEATH) ×2 IMPLANT
TRAY PACEMAKER INSERTION (PACKS) ×2 IMPLANT

## 2016-02-20 NOTE — Discharge Instructions (Signed)
° ° °  Supplemental Discharge Instructions for  Pacemaker/Defibrillator Patients  Activity No heavy lifting or vigorous activity with your left/right arm for 6 to 8 weeks.  Do not raise your left/right arm above your head for one week.  Gradually raise your affected arm as drawn below.           __       02/24/16                    02/25/16                    02/26/16                02/27/16  NO DRIVING for  1 week   ; you may begin driving on  [id number]  .  WOUND CARE - Keep the wound area clean and dry.  Do not get this area wet for one week. No showers for one week; you may shower on  02/27/16   . - The tape/steri-strips on your wound will fall off; do not pull them off.  No bandage is needed on the site.  DO  NOT apply any creams, oils, or ointments to the wound area. - If you notice any drainage or discharge from the wound, any swelling or bruising at the site, or you develop a fever > 101? F after you are discharged home, call the office at once.  Special Instructions - You are still able to use cellular telephones; use the ear opposite the side where you have your pacemaker/defibrillator.  Avoid carrying your cellular phone near your device. - When traveling through airports, show security personnel your identification card to avoid being screened in the metal detectors.  Ask the security personnel to use the hand wand. - Avoid arc welding equipment, MRI testing (magnetic resonance imaging), TENS units (transcutaneous nerve stimulators).  Call the office for questions about other devices. - Avoid electrical appliances that are in poor condition or are not properly grounded. - Microwave ovens are safe to be near or to operate.

## 2016-02-20 NOTE — Discharge Summary (Signed)
ELECTROPHYSIOLOGY PROCEDURE DISCHARGE SUMMARY    Patient ID: [name] [name],  MRN: [id number], DOB/AGE: [birth year] 77 y.o.  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  Mitral valve disease s/p MVR 2.  Persistent atrial fibrillation s/p MAZE 3.  Symptomatic bradycardia s/p PPM implantation  4.  Hyperlipidemia 5.  ITP  Allergies  Allergen Reactions  . Meloxicam Other (See Comments)    Severe reflux  . Codeine Nausea And Vomiting    migraine  . Doxycycline Itching and Swelling    Facial  . Hydrocodone Nausea And Vomiting    MIGRAINE  . Hydromorphone Nausea And Vomiting    migraine  . Oxycodone Nausea And Vomiting    Severe migraine     Procedures This Admission:  1.  Right atrial lead revision on 02/20/16 by Dr Lovena Le. The patient's previously implanted right atrial lead was removed and a new 5076 right atrial lead was placed.  There were no immediate post procedure complications. 2.  CXR on 02/21/16 demonstrated no pneumothorax status post device implantation.   Brief HPI: [name] [name] is a 75 y.o. female s/p PPM implantation after developing symptomatic bradycardia post MVR and MAZE.  She has developed atrial lead exit block with a threshold of >5V.  Risks, benefits, and alternatives to lead revision were reviewed with the patient who wished to proceed.   Hospital Course:  The patient was admitted and underwent implantation of a new right atrial lead with details as outlined above.  She  was monitored on telemetry overnight which demonstrated atrial pacing.  Left chest was without hematoma or ecchymosis.  The device was interrogated and found to be functioning normally.  CXR was obtained and demonstrated no  pneumothorax status post device implantation.  Wound care, arm mobility, and restrictions were reviewed with the patient.  The patient was examined by Dr Lovena Le and considered stable for discharge to home.    Physical Exam: Filed Vitals:   02/20/16 1105 02/20/16 1431 02/20/16 2031 02/21/16 0556  BP: 113/62 113/64 101/60 110/68  Pulse: 76 69 70 70  Temp:   98.5 F (36.9 C) 97.6 F (36.4 C)  TempSrc: Oral  Oral Oral  Resp: 14  15 16   Height:      Weight:      SpO2: 100%  96% 96%     Labs:   Lab Results  Component Value Date   WBC 4.3 02/08/2016   HGB 10.9* 02/08/2016   HCT 34.5* 02/08/2016   MCV 82.5 02/08/2016   PLT 157 02/08/2016     Recent Labs Lab 02/15/16 0930  NA 140  K 4.1  CL 104  CO2 29  BUN 22  CREATININE 0.96  CALCIUM 9.2  PROT 6.4  BILITOT 0.4  ALKPHOS 77  ALT 18  AST 26  GLUCOSE 94    Discharge Medications:    Medication List    TAKE these medications        albuterol 108 (90 Base) MCG/ACT inhaler  Commonly known as:  PROVENTIL HFA;VENTOLIN HFA  Inhale 2 puffs into the lungs every 6 (six) hours as needed for wheezing or shortness of breath.     ALPRAZolam 0.25 MG tablet  Commonly known as:  XANAX  Take 1 tablet (0.25 mg total) by mouth  at bedtime as needed for anxiety or sleep.     amoxicillin 500 MG tablet  Commonly known as:  AMOXIL  Take 4 tablets (2,000 mg total) by mouth once. Take 4 tablets 60 minutes prior to dental work     azelastine 0.1 % nasal spray  Commonly known as:  ASTELIN  Place 1 spray into both nostrils as needed for allergies.     butalbital-acetaminophen-caffeine 50-325-40 MG tablet  Commonly known as:  FIORICET, ESGIC  TAKE 1 TABLET BY MOUTH AS NEEDED FOR BACK PAIN OR HEADACHE     furosemide 40 MG tablet  Commonly known as:  LASIX  Take 40 mg by mouth daily as needed for fluid.     metoprolol tartrate 25 MG tablet  Commonly known as:  LOPRESSOR  Take 0.5 tablets (12.5 mg total) by mouth 2 (two) times  daily.     montelukast 10 MG tablet  Commonly known as:  SINGULAIR  Take 10 mg by mouth at bedtime.     rosuvastatin 10 MG tablet  Commonly known as:  CRESTOR  Take 1 tablet (10 mg total) by mouth daily.     traMADol 50 MG tablet  Commonly known as:  ULTRAM  Take 1-2 tablets (50-100 mg total) by mouth every 4 (four) hours as needed for moderate pain.     Vitamin D 2000 units Caps  Take 2,000 Units by mouth daily.     warfarin 2 MG tablet  Commonly known as:  COUMADIN  Take 1 tablet (2 mg total) by mouth as directed.     warfarin 1 MG tablet  Commonly known as:  COUMADIN  Take 1 tablet (1 mg total) by mouth daily.        Disposition:  Discharge Instructions    Diet - low sodium heart healthy    Complete by:  As directed      Increase activity slowly    Complete by:  As directed           Follow-up Information    Follow up with [facility] On 03/04/2016.   Specialty:  Cardiology   Why:  at 3:30PM for wound check    Contact information:   [address] [place] [phone]      Follow up with [facility] On 02/28/2016.   Specialty:  Cardiology   Why:  at 12:20PM for coumadin check    Contact information:   [address] [place] [phone]      Follow up with Cristopher Peru, MD On 05/23/2016.   Specialty:  Cardiology   Why:  at [place] information:   [place] [address] [id number] [phone]       Duration of Discharge Encounter: Greater than 30 minutes including physician time.  Signed, Chanetta Marshall, NP 02/21/2016 8:11 AM   EP Attending  Patient seen and examined. Agree with the findings as noted above. The patient is stable, s/p atrial lead revision. She is stable for discharge home. Usual followup.  Mikle Bosworth.D.

## 2016-02-20 NOTE — H&P (View-Only) (Signed)
HPI [name] [name] returns today for PPM followup. 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 She underwent PPM insertion. She has developed an increased atrial pacing threshold. She has completed cardiac rehab. No other complaints today.  Allergies  Allergen Reactions  . Oxycodone-Acetaminophen Other (See Comments)    Other Reaction: migraine  . Meloxicam Other (See Comments)    Severe reflux  . Codeine Other (See Comments)    migraine  . Doxycycline Itching  . Hydrocodone Nausea And Vomiting    MIGRAINE  . Hydromorphone Nausea And Vomiting    migraine  . Oxycodone Other (See Comments)    Severe migraine     Current Outpatient Prescriptions  Medication Sig Dispense Refill  . albuterol (PROVENTIL HFA;VENTOLIN HFA) 108 (90 BASE) MCG/ACT inhaler Inhale 2 puffs into the lungs every 6 (six) hours as needed for wheezing or shortness of breath. 1 Inhaler 2  . ALPRAZolam (XANAX) 0.25 MG tablet Take 1 tablet (0.25 mg total) by mouth at bedtime as needed for anxiety or sleep. 30 tablet 5  . amoxicillin (AMOXIL) 500 MG tablet Take 4 tablets (2,000 mg total) by mouth once. Take 4 tablets 60 minutes prior to dental work 30 tablet 1  . azelastine (ASTELIN) 0.1 % nasal spray Place 1 spray into both nostrils as needed for allergies.   5  . butalbital-acetaminophen-caffeine (FIORICET) 50-325-40 MG tablet Take by mouth 2 (two) times daily as needed for headache (or backpain).    . furosemide (LASIX) 40 MG tablet Take 40 mg by mouth daily as needed for fluid.    . metoprolol tartrate (LOPRESSOR) 25 MG tablet Take 0.5 tablets (12.5 mg total) by mouth 2 (two) times daily. 90 tablet 3  . montelukast (SINGULAIR) 10 MG tablet Take 10 mg by mouth daily.     . rosuvastatin (CRESTOR) 5 MG tablet Take 1 tablet (5 mg total) by mouth every other day. 45 tablet 3  . traMADol (ULTRAM) 50 MG tablet Take 1-2 tablets (50-100  mg total) by mouth every 4 (four) hours as needed for moderate pain. 30 tablet 0  . warfarin (COUMADIN) 1 MG tablet Take 1 tablet (1 mg total) by mouth daily. 90 tablet 3  . warfarin (COUMADIN) 2 MG tablet Take 1 tablet (2 mg total) by mouth as directed. 30 tablet 3   No current facility-administered medications for this visit.     Past Medical History  Diagnosis Date  . SVT (supraventricular tachycardia) ([place])   . MVP (mitral valve prolapse)   . Hyperlipidemia   . Idiopathic thrombocytopenic purpura (ITP)   . Hemorrhoid   . IBS (irritable bowel syndrome)   . Migraine   . Severe mitral regurgitation   . RBBB   . Anxiety associated with depression     Prn alprazolam   . Thoracic aorta atherosclerosis ([place])   . Nodule of right lung   . Restrictive lung disease     Mild on PFT & likely cardiac in etiology   . 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 Atrial fibrillation, persistent ([place])     DCCV 08/22/2015  . Acute on chronic diastolic (congestive) heart failure ([place])   . S/P minimally invasive mitral valve replacement with bioprosthetic valve 10/31/2015    33 mm [name] Mitral bovine bioprosthetic tissue valve placed via right mini thoracotomy approach  . S/P Minimally invasive maze operation for atrial fibrillation 10/31/2015  Complete bilateral atrial lesion set using cryothermy and bipolar radiofrequency ablation with clipping of LA appendage via right mini thoracotomy approach  . Bradycardia post-op bradycardia, pacer dependent    MDT PPM 11/06/15, Dr. Lovena Le    ROS:   All systems reviewed and negative except as noted in the HPI.   Past Surgical History  Procedure Laterality Date  . Tubal ligation    . Foot surgery    . Varicose vein surgery Right   . Mandible fracture surgery  03-26-13  . Colonoscopy  2003  . Eye surgery Right 2013  . Breast biopsy    . Cardioversion N/A 08/22/2015    Procedure: CARDIOVERSION;  Surgeon: Thayer Headings, MD;  Location: [facility] ENDOSCOPY;   Service: Cardiovascular;  Laterality: N/A;  . Tee without cardioversion N/A 08/22/2015    Procedure: TRANSESOPHAGEAL ECHOCARDIOGRAM (TEE);  Surgeon: Thayer Headings, MD;  Location: [facility] ENDOSCOPY;  Service: Cardiovascular;  Laterality: N/A;  Darden Dates with cardioversion    . Cardiac catheterization N/A 10/18/2015    Procedure: Right/Left Heart Cath and Coronary Angiography;  Surgeon: Sherren Mocha, MD;  Location: [place] CV LAB;  Service: Cardiovascular;  Laterality: N/A;  . Minimally invasive maze procedure N/A 10/31/2015    Procedure: MINIMALLY INVASIVE MAZE PROCEDURE;  Surgeon: Rexene Alberts, MD;  Location: [place];  Service: Open Heart Surgery;  Laterality: N/A;  . Tee without cardioversion N/A 10/31/2015    Procedure: TRANSESOPHAGEAL ECHOCARDIOGRAM (TEE);  Surgeon: Rexene Alberts, MD;  Location: [place];  Service: Open Heart Surgery;  Laterality: N/A;  . Mitral valve replacement Right 10/31/2015    Procedure: MINIMALLY INVASIVE MITRAL VALVE (MV) REPLACEMENT;  Surgeon: Rexene Alberts, MD;  Location: [place];  Service: Open Heart Surgery;  Laterality: Right;  . Ep implantable device N/A 11/06/2015    Procedure: Pacemaker Implant;  Surgeon: Evans Lance, MD;  Location: [place] CV LAB;  Service: Cardiovascular;  Laterality: N/A;     Family History  Problem Relation Age of Onset  . Hypertension Mother   . Arrhythmia Mother   . Heart failure Mother   . Arrhythmia Brother   . Stroke Brother 100    cerebral hemorrhage, nonsmoker, no HTN  . Stroke Father     from an aneurysm  . Stroke Maternal Aunt 83    cerebral hemorrhage  . Liver cancer Maternal Grandmother   . Atrial fibrillation Son   . Heart attack Neg Hx      Social History   Social History  . Marital Status: Widowed    Spouse Name: Deceased  . Number of Children: N/A  . Years of Education: 16   Occupational History  . Not on file.   Social History Main Topics  . Smoking status: Never Smoker   . Smokeless tobacco:  Never Used  . Alcohol Use: No  . Drug Use: No  . Sexual Activity: No   Other Topics Concern  . Not on file   Social History Narrative   She is a widow.  Husband died from metastatic renal cell cancer. Originally from [place]. Previously lived in [place] from 1975-[place]. Moved to [place] in 2007. No mold exposure recently but did have it through a prior work exposure in 1993. Has a masters in public health. No bird exposure.     BP 110/70 mmHg  Pulse 84  Ht 5\' 6"  (1.676 m)  Wt 144 lb 9.6 oz (65.59 kg)  BMI 23.35 kg/m2  LMP  (Exact Date)  Physical Exam:  Well appearing 75 yo woman, NAD HEENT: Unremarkable Neck:  6 cm JVD, no thyromegally Lymphatics:  No adenopathy Back:  No CVA tenderness Lungs:  Clear with no wheezes HEART:  Regular rate rhythm, no murmurs, no rubs, no clicks Abd:  soft, positive bowel sounds, no organomegally, no rebound, no guarding Ext:  2 plus pulses, no edema, no cyanosis, no clubbing Skin:  No rashes no nodules Neuro:  CN II through XII intact, motor grossly intact   DEVICE  Normal device function except for elevated pacing thresholds.  See PaceArt for details.   Assess/Plan: 1. PPM lead dysfunction - her pacing threshold both bipolar and unipolar is elevated. She will need to undergo atrial lead revision. I would anticipate removing her old atrial lead and inserting a new one. The risks/benefits/goals/expectations of the procedure have been discussed and she wishes to proceed. 2. Atrial fibrillation - she is s/p MAZE. She will continue anti-coagulation and beta blocker therapy. She is maintaining NSR off of amiodarone. 3. Dyslipidemia - she will continue her statin therapy. 4. HTN - her blood pressure is well controlled. No change in meds.  Mikle Bosworth.D.

## 2016-02-20 NOTE — Interval H&P Note (Signed)
History and Physical Interval Note:  02/20/2016 7:25 AM  [name] [name]  has presented today for surgery, with the diagnosis of high atrial throsholds  The various methods of treatment have been discussed with the patient and family. After consideration of risks, benefits and other options for treatment, the patient has consented to  Procedure(s): Lead Revision/Repair (N/A) as a surgical intervention .  The patient's history has been reviewed, patient examined, no change in status, stable for surgery.  I have reviewed the patient's chart and labs.  Questions were answered to the patient's satisfaction.     Cristopher Peru

## 2016-02-21 ENCOUNTER — Telehealth: Payer: Self-pay | Admitting: Pharmacist

## 2016-02-21 ENCOUNTER — Ambulatory Visit (HOSPITAL_COMMUNITY): Payer: Medicare Other

## 2016-02-21 DIAGNOSIS — T82110A Breakdown (mechanical) of cardiac electrode, initial encounter: Secondary | ICD-10-CM | POA: Diagnosis not present

## 2016-02-21 DIAGNOSIS — Z45010 Encounter for checking and testing of cardiac pacemaker pulse generator [battery]: Secondary | ICD-10-CM

## 2016-02-21 DIAGNOSIS — E785 Hyperlipidemia, unspecified: Secondary | ICD-10-CM | POA: Diagnosis not present

## 2016-02-21 DIAGNOSIS — I481 Persistent atrial fibrillation: Secondary | ICD-10-CM | POA: Diagnosis not present

## 2016-02-21 DIAGNOSIS — Z952 Presence of prosthetic heart valve: Secondary | ICD-10-CM | POA: Diagnosis not present

## 2016-02-21 LAB — PROTIME-INR
INR: 1.59 — ABNORMAL HIGH (ref 0.00–1.49)
Prothrombin Time: 19 seconds — ABNORMAL HIGH (ref 11.6–15.2)

## 2016-02-21 NOTE — Telephone Encounter (Signed)
Pt called with questions about Coumadin and diet with green vegetables.  Explained ok to eat a serving a few days of the week.

## 2016-02-21 NOTE — Progress Notes (Signed)
Pt given discharge instructions, medication lists, follow up appointments, and when to call the doctor.  Pt verbalizes understanding. Kariem Wolfson McClintock, RN  

## 2016-02-23 ENCOUNTER — Telehealth: Payer: Self-pay | Admitting: Internal Medicine

## 2016-02-23 DIAGNOSIS — K764 Peliosis hepatis: Secondary | ICD-10-CM

## 2016-02-23 NOTE — Telephone Encounter (Signed)
Ok, referral has been placed.

## 2016-02-23 NOTE — Telephone Encounter (Signed)
Please advise for referral, thanks 

## 2016-02-23 NOTE — Telephone Encounter (Signed)
Pt called and stated that she was suppose to be referred to Dr. 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 I do see in the note from 3/16 that you put that the referral is in process but there isn't one in her chart. Please advise.

## 2016-02-26 ENCOUNTER — Encounter: Payer: Self-pay | Admitting: Internal Medicine

## 2016-02-28 ENCOUNTER — Ambulatory Visit (INDEPENDENT_AMBULATORY_CARE_PROVIDER_SITE_OTHER): Payer: Medicare Other

## 2016-02-28 DIAGNOSIS — Z953 Presence of xenogenic heart valve: Secondary | ICD-10-CM | POA: Diagnosis not present

## 2016-02-28 DIAGNOSIS — Z9889 Other specified postprocedural states: Secondary | ICD-10-CM

## 2016-02-28 DIAGNOSIS — Z8679 Personal history of other diseases of the circulatory system: Secondary | ICD-10-CM | POA: Diagnosis not present

## 2016-02-28 LAB — POCT INR: INR: 1.3

## 2016-03-04 ENCOUNTER — Other Ambulatory Visit: Payer: Self-pay

## 2016-03-04 ENCOUNTER — Ambulatory Visit (HOSPITAL_COMMUNITY): Payer: Medicare Other | Attending: Cardiovascular Disease

## 2016-03-04 ENCOUNTER — Ambulatory Visit: Payer: Medicare Other

## 2016-03-04 ENCOUNTER — Encounter: Payer: Self-pay | Admitting: Internal Medicine

## 2016-03-04 ENCOUNTER — Ambulatory Visit (INDEPENDENT_AMBULATORY_CARE_PROVIDER_SITE_OTHER): Payer: Medicare Other | Admitting: *Deleted

## 2016-03-04 DIAGNOSIS — I517 Cardiomegaly: Secondary | ICD-10-CM | POA: Diagnosis not present

## 2016-03-04 DIAGNOSIS — I059 Rheumatic mitral valve disease, unspecified: Secondary | ICD-10-CM | POA: Diagnosis not present

## 2016-03-04 DIAGNOSIS — Z953 Presence of xenogenic heart valve: Secondary | ICD-10-CM | POA: Insufficient documentation

## 2016-03-04 DIAGNOSIS — Z950 Presence of cardiac pacemaker: Secondary | ICD-10-CM

## 2016-03-04 LAB — CUP PACEART INCLINIC DEVICE CHECK
Battery Impedance: 112 Ohm
Battery Remaining Longevity: 120 mo
Battery Voltage: 2.8 V
Brady Statistic AP VP Percent: 69 %
Brady Statistic AP VS Percent: 0 %
Brady Statistic AS VP Percent: 31 %
Brady Statistic AS VS Percent: 0 %
Date Time Interrogation Session: 20170403183645
Implantable Lead Implant Date: 20161205
Implantable Lead Implant Date: 20170321
Implantable Lead Location: 753859
Implantable Lead Location: 753860
Implantable Lead Model: 5076
Implantable Lead Model: 5076
Lead Channel Impedance Value: 493 Ohm
Lead Channel Impedance Value: 792 Ohm
Lead Channel Pacing Threshold Amplitude: 0.5 V
Lead Channel Pacing Threshold Amplitude: 0.5 V
Lead Channel Pacing Threshold Pulse Width: 0.4 ms
Lead Channel Pacing Threshold Pulse Width: 0.4 ms
Lead Channel Sensing Intrinsic Amplitude: 0.7 mV
Lead Channel Sensing Intrinsic Amplitude: 5.6 mV
Lead Channel Setting Pacing Amplitude: 2.5 V
Lead Channel Setting Pacing Amplitude: 3.5 V
Lead Channel Setting Pacing Pulse Width: 0.4 ms
Lead Channel Setting Sensing Sensitivity: 4 mV

## 2016-03-04 NOTE — Progress Notes (Signed)
Wound check appointment. Steri-strips removed. Wound without redness or edema. Incision edges approximated, wound healing, one stitch removed from medial incision. ROV with device clinic 4/13 for recheck of wound. Normal device function. Thresholds, sensing, and impedances consistent with implant measurements.. Histogram distribution blunted, Rate response turned on at historic settings. No mode switches or high ventricular rates noted. Patient educated about wound care, arm mobility, lifting restrictions. ROV in 3 months with GT.

## 2016-03-06 ENCOUNTER — Ambulatory Visit (INDEPENDENT_AMBULATORY_CARE_PROVIDER_SITE_OTHER): Payer: Medicare Other

## 2016-03-06 DIAGNOSIS — Z8679 Personal history of other diseases of the circulatory system: Secondary | ICD-10-CM

## 2016-03-06 DIAGNOSIS — Z953 Presence of xenogenic heart valve: Secondary | ICD-10-CM

## 2016-03-06 DIAGNOSIS — Z9889 Other specified postprocedural states: Secondary | ICD-10-CM

## 2016-03-06 LAB — POCT INR: INR: 1.5

## 2016-03-06 MED ORDER — WARFARIN SODIUM 4 MG PO TABS: ORAL_TABLET | ORAL | Status: DC

## 2016-03-11 ENCOUNTER — Encounter: Payer: Self-pay | Admitting: Internal Medicine

## 2016-03-13 ENCOUNTER — Ambulatory Visit (INDEPENDENT_AMBULATORY_CARE_PROVIDER_SITE_OTHER): Payer: Medicare Other

## 2016-03-13 DIAGNOSIS — Z953 Presence of xenogenic heart valve: Secondary | ICD-10-CM | POA: Diagnosis not present

## 2016-03-13 DIAGNOSIS — I481 Persistent atrial fibrillation: Secondary | ICD-10-CM | POA: Diagnosis not present

## 2016-03-13 DIAGNOSIS — Z9889 Other specified postprocedural states: Secondary | ICD-10-CM

## 2016-03-13 DIAGNOSIS — Z8679 Personal history of other diseases of the circulatory system: Secondary | ICD-10-CM | POA: Diagnosis not present

## 2016-03-13 LAB — POCT INR: INR: 2.1

## 2016-03-14 ENCOUNTER — Ambulatory Visit (INDEPENDENT_AMBULATORY_CARE_PROVIDER_SITE_OTHER): Payer: Medicare Other | Admitting: *Deleted

## 2016-03-14 ENCOUNTER — Encounter: Payer: Self-pay | Admitting: Internal Medicine

## 2016-03-14 DIAGNOSIS — Z950 Presence of cardiac pacemaker: Secondary | ICD-10-CM

## 2016-03-14 NOTE — Progress Notes (Signed)
Wound recheck in clinic. Incision well healed, no evidence of any stitch remains. Wound without redness or edema. ROV with GT 6/22.

## 2016-03-19 ENCOUNTER — Ambulatory Visit: Payer: Medicare Other

## 2016-03-20 ENCOUNTER — Ambulatory Visit (INDEPENDENT_AMBULATORY_CARE_PROVIDER_SITE_OTHER): Payer: Medicare Other

## 2016-03-20 ENCOUNTER — Telehealth: Payer: Self-pay | Admitting: Internal Medicine

## 2016-03-20 DIAGNOSIS — M810 Age-related osteoporosis without current pathological fracture: Secondary | ICD-10-CM | POA: Diagnosis not present

## 2016-03-20 DIAGNOSIS — Z8679 Personal history of other diseases of the circulatory system: Secondary | ICD-10-CM | POA: Diagnosis not present

## 2016-03-20 DIAGNOSIS — Z9889 Other specified postprocedural states: Secondary | ICD-10-CM

## 2016-03-20 DIAGNOSIS — Z953 Presence of xenogenic heart valve: Secondary | ICD-10-CM

## 2016-03-20 LAB — POCT INR: INR: 2.5

## 2016-03-20 MED ORDER — DENOSUMAB 60 MG/ML ~~LOC~~ SOLN
60.0000 mg | Freq: Once | SUBCUTANEOUS | Status: AC
  Administered 2016-03-20: 60 mg via SUBCUTANEOUS

## 2016-03-20 NOTE — Progress Notes (Signed)
Patient came in for a Prolia injection.  Received in Left subcutaneous arm tissue.  Patient tolerated well.

## 2016-03-21 NOTE — Telephone Encounter (Signed)
Patient received the injection on 4/19   At 4pm. Thanks

## 2016-03-25 LAB — HM DIABETES EYE EXAM

## 2016-04-03 ENCOUNTER — Ambulatory Visit (INDEPENDENT_AMBULATORY_CARE_PROVIDER_SITE_OTHER): Payer: Medicare Other

## 2016-04-03 DIAGNOSIS — Z9889 Other specified postprocedural states: Secondary | ICD-10-CM

## 2016-04-03 DIAGNOSIS — Z953 Presence of xenogenic heart valve: Secondary | ICD-10-CM | POA: Diagnosis not present

## 2016-04-03 DIAGNOSIS — Z8679 Personal history of other diseases of the circulatory system: Secondary | ICD-10-CM

## 2016-04-03 LAB — POCT INR: INR: 2.2

## 2016-04-10 ENCOUNTER — Telehealth: Payer: Self-pay | Admitting: Cardiovascular Disease

## 2016-04-10 NOTE — Telephone Encounter (Signed)
Returned call to pt, pt states she missed her 4mg  dosage of Coumadin last night.  Last INR on 04/03/16 was 2.2, she is due to take 6mg  of Coumadin tonight.  Advised pt to take 8mg  tonight and then resume previous dosage 4mg  daily except 6mg  on MWF.  Keep scheduled follow-up appt in Coumadin Clinic.  Pt verbalized understanding.

## 2016-04-10 NOTE — Telephone Encounter (Signed)
Pt states she forgot to take her coumadin last night. Please call and advise what she needs to do.

## 2016-04-16 ENCOUNTER — Encounter: Payer: Self-pay | Admitting: Internal Medicine

## 2016-04-16 ENCOUNTER — Ambulatory Visit (INDEPENDENT_AMBULATORY_CARE_PROVIDER_SITE_OTHER): Payer: Medicare Other | Admitting: *Deleted

## 2016-04-16 ENCOUNTER — Encounter: Payer: Self-pay | Admitting: Cardiovascular Disease

## 2016-04-16 ENCOUNTER — Ambulatory Visit (INDEPENDENT_AMBULATORY_CARE_PROVIDER_SITE_OTHER): Payer: Medicare Other | Admitting: Cardiovascular Disease

## 2016-04-16 VITALS — BP 110/76 | HR 79 | Ht 65.0 in | Wt 139.8 lb

## 2016-04-16 DIAGNOSIS — I480 Paroxysmal atrial fibrillation: Secondary | ICD-10-CM

## 2016-04-16 DIAGNOSIS — Z950 Presence of cardiac pacemaker: Secondary | ICD-10-CM | POA: Diagnosis not present

## 2016-04-16 DIAGNOSIS — Z952 Presence of prosthetic heart valve: Secondary | ICD-10-CM

## 2016-04-16 DIAGNOSIS — Z954 Presence of other heart-valve replacement: Secondary | ICD-10-CM | POA: Diagnosis not present

## 2016-04-16 LAB — CUP PACEART INCLINIC DEVICE CHECK
Battery Impedance: 100 Ohm
Battery Remaining Longevity: 108 mo
Battery Voltage: 2.8 V
Brady Statistic AP VP Percent: 83 %
Brady Statistic AP VS Percent: 0 %
Brady Statistic AS VP Percent: 17 %
Brady Statistic AS VS Percent: 0 %
Date Time Interrogation Session: 20170516111614
Implantable Lead Implant Date: 20161205
Implantable Lead Implant Date: 20170321
Implantable Lead Location: 753859
Implantable Lead Location: 753860
Implantable Lead Model: 5076
Implantable Lead Model: 5076
Lead Channel Impedance Value: 501 Ohm
Lead Channel Impedance Value: 859 Ohm
Lead Channel Pacing Threshold Amplitude: 0.5 V
Lead Channel Pacing Threshold Amplitude: 0.75 V
Lead Channel Pacing Threshold Pulse Width: 0.4 ms
Lead Channel Pacing Threshold Pulse Width: 0.4 ms
Lead Channel Sensing Intrinsic Amplitude: 0.7 mV
Lead Channel Sensing Intrinsic Amplitude: 8 mV
Lead Channel Setting Pacing Amplitude: 2.5 V
Lead Channel Setting Pacing Amplitude: 3.5 V
Lead Channel Setting Pacing Pulse Width: 0.4 ms
Lead Channel Setting Sensing Sensitivity: 4 mV

## 2016-04-16 LAB — PROTIME-INR
INR: 1.8 — ABNORMAL HIGH (ref <1.50)
Prothrombin Time: 21.1 seconds — ABNORMAL HIGH (ref 11.6–15.2)

## 2016-04-16 MED ORDER — APIXABAN 5 MG PO TABS: 5.0000 mg | ORAL_TABLET | Freq: Two times a day (BID) | ORAL | Status: DC

## 2016-04-16 MED ORDER — FUROSEMIDE 40 MG PO TABS: 40.0000 mg | ORAL_TABLET | Freq: Every day | ORAL | Status: DC | PRN

## 2016-04-16 NOTE — Patient Instructions (Signed)
Medication Instructions:  STOP Warfarin and start Eliquis 5 mg twice daily when Pt/INR is < 2 WE WILL CALL YOU WITH LAB RESULTS   Labwork: TODAY - PT/INR   Testing/Procedures: None Ordered   Follow-Up: Your physician recommends that you schedule a follow-up appointment in: 3 months with Dr. Acie Fredrickson.    If you need a refill on your cardiac medications before your next appointment, please call your pharmacy.   Thank you for choosing [facility]! Christen Bame, RN [phone]

## 2016-04-16 NOTE — Progress Notes (Signed)
Pacemaker check in clinic with Dr. Acie Fredrickson d/t patient a couple episodes of feeling "odd." Normal device function. Thresholds, sensing, impedances consistent with previous measurements. Device programmed to maximize longevity. 7 mode switches- all < 1 min. No high ventricular rates noted. Device programmed at appropriate safety margins. Histogram distribution appropriate for patient activity level. Device programmed to optimize intrinsic conduction. Estimated longevity 7.5-10 years. Patient enrolled in remote follow-up. ROV with GT 05/23/16, 3 months s/p RA lead revision.

## 2016-04-16 NOTE — Progress Notes (Addendum)
[name] [name] Date of Birth  Dec 15, 1940       [facility] [address], [place], [place]  [address]   [place], [place]  [address] [phone]     [phone]   Fax  [phone]    Fax [phone]  Problem List: 1. Supraventricular tachycardia 2. Mitral valve prolapse - s/p mitral valve replacement  3. Cataracts 4.   History of Present Illness:  [name] [name] is a  75 yo with hx of SVT and MVP.  She needs to have cataract surgery and is hear for pre op evaluation.  She has done well.  She has not been exercising as much as she would like.  She has not had any syncope or presyncope.  She has been taking care of her granddaughter who has ADD and a sleep disorder.  [name] [name] did not get much sleep that weekend and had lots of palpitations for the following week.  Oct. 7, 2014  [name] [name] is seen after a 1 1/2 year absence.  She has had some fatigue.   Dr. Derrel Nip has found thrombocytopenia.  Crestor was stopped but this did not help the thrombocytopenia.   She has seen a hemotologist.    She fell  (caught her toe at a [employer] - not syncope) several months ago and had reconstruction of her jaw.  She also broke her left wrist.     Oct. 2, 2015 :  [name] [name] is doing well from a cardiac standpoint.  Her cholesterol levels are slightly elevated ( even on Crestor 5 mg a day).  She has had marginally low platelets .  Her cholesterol levels are still minimally elevated.    She had some dyspnea this summer.  Currently , she has no limitations.  She thinks the symptoms may have been due to allergies.  No chest pain.      She thinks that her stamina is not as good as it should be.  She does yoga but no cardio workouts.    Sept 6, 2016:  [name] [name] is seen today for a recent episode of paroxysmal atrial fib August 21 was shopping , felt very weak .   Could not walk to her car.  Very short of breath .  She went to [place] a day or so  later.  Gradually had worsening chest tightness, allergy, weakness.  Thought it was allergies.  Had significant leg swelling .   She went to the hospital. She was found have rapid atrial fibrillation with evidence of congestive heart failure. Her echocardiogram showed normal left ventricle systolic function. She has mild mitral regurgitation. Was significant calcification of the mitral valve annulus. She was started on Lovenox. She's not received Coumadin at this point.  She was started on diltiazem and Lasix.  Was started on Lovenox ( because of the atrial fib in the setting of MR and itral valve calcification .   She seems to be doing quite a lot at her. She's been going to doctors appointments and has been walking across the parking lot  without any CP with problems.  Sept. 13, 2016:  [name] [name] is seen back for a 1 week visit. She was found to have rapid atrial fib last week Had some dyspnea last night - better with a Xopenex sample inhaler.  Feels better on the Eliquis and Metoprolol .  Feels more relaxed.  Cannot tell if she is in atrial fib  Has low energy .  Difficult to walk to the mailbox without extreme fatigue , not specifically short of breath   Oct. 11, 2016:  Was cardioverted several weeks ago.  Feeling better every day since that   Nov. 10, 2016:  Has done well.  Steady improvement since  the cardioversion.  Then for the past several days/ weeks has not been doing quite a good Has shortness of breath with showering and doing everyday activities.    Feb. 10, 2017:  Is doing well - has had Mitral valve replacement and MAZE procedure since I last saw her  Just this past week, she felt much better.   Has been fatigued but now has more energetic . Has gain  Apr 16, 2016:  [name] [name] has had an atrial pacer lead revision since I last saw her. Feels "a bit off since last Thursday " Was bending over to prune some bushes.  Has not felt right since that time  Goes to the [facility])  Feels "odd" when she bends over.  Has been anxious recently    Current Outpatient Prescriptions on File Prior to Visit  Medication Sig Dispense Refill  . albuterol (PROVENTIL HFA;VENTOLIN HFA) 108 (90 BASE) MCG/ACT inhaler Inhale 2 puffs into the lungs every 6 (six) hours as needed for wheezing or shortness of breath. 1 Inhaler 2  . ALPRAZolam (XANAX) 0.25 MG tablet Take 1 tablet (0.25 mg total) by mouth at bedtime as needed for anxiety or sleep. 30 tablet 5  . amoxicillin (AMOXIL) 500 MG tablet Take 4 tablets (2,000 mg total) by mouth once. Take 4 tablets 60 minutes prior to dental work 30 tablet 1  . azelastine (ASTELIN) 0.1 % nasal spray Place 1 spray into both nostrils as needed for allergies.   5  . b complex vitamins tablet Take 1 tablet by mouth daily.    . butalbital-acetaminophen-caffeine (FIORICET, ESGIC) 50-325-40 MG tablet TAKE 1 TABLET BY MOUTH AS NEEDED FOR BACK PAIN OR HEADACHE 90 tablet 2  . Cholecalciferol (VITAMIN D) 2000 units CAPS Take 2,000 Units by mouth daily.    . Coenzyme Q10 (CO Q 10) 100 MG CAPS Take 100 mg by mouth daily.    . furosemide (LASIX) 40 MG tablet Take 40 mg by mouth daily as needed for fluid.    . metoprolol tartrate (LOPRESSOR) 25 MG tablet Take 0.5 tablets (12.5 mg total) by mouth 2 (two) times daily. 90 tablet 3  . montelukast (SINGULAIR) 10 MG tablet Take 10 mg by mouth at bedtime.     . rosuvastatin (CRESTOR) 10 MG tablet Take 1 tablet (10 mg total) by mouth daily. 30 tablet 2  . traMADol (ULTRAM) 50 MG tablet Take 1-2 tablets (50-100 mg total) by mouth every 4 (four) hours as needed for moderate pain. 30 tablet 0  . warfarin (COUMADIN) 4 MG tablet Take as directed by Coumadin Clinic 120 tablet 1   No current facility-administered medications on file prior to visit.    Allergies  Allergen Reactions  . Meloxicam Other (See Comments)    Severe reflux  . Codeine Nausea And Vomiting    migraine  . Doxycycline Itching and Swelling      Facial  . Hydrocodone Nausea And Vomiting    MIGRAINE  . Hydromorphone Nausea And Vomiting    migraine  . Oxycodone Nausea And Vomiting    Severe migraine    Past Medical History  Diagnosis Date  . SVT (supraventricular tachycardia) ([place])   .  MVP (mitral valve prolapse)   . Hyperlipidemia   . Idiopathic thrombocytopenic purpura (ITP)   . Hemorrhoid   . IBS (irritable bowel syndrome)   . Migraine   . Severe mitral regurgitation   . RBBB   . Anxiety associated with depression     Prn alprazolam   . Thoracic aorta atherosclerosis ([place])   . Nodule of right lung   . Restrictive lung disease     Mild on PFT & likely cardiac in etiology   . 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 Atrial fibrillation, persistent ([place])     DCCV 08/22/2015  . Acute on chronic diastolic (congestive) heart failure ([place])   . 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 S/P Minimally invasive maze operation for atrial fibrillation 10/31/2015    Complete bilateral atrial lesion set using cryothermy and bipolar radiofrequency ablation with clipping of LA appendage via right mini thoracotomy approach  . Bradycardia post-op bradycardia, pacer dependent    MDT PPM 11/06/15, Dr. Lovena Le    Past Surgical History  Procedure Laterality Date  . Tubal ligation    . Foot surgery    . Varicose vein surgery Right   . Mandible fracture surgery  03-26-13  . Colonoscopy  2003  . Eye surgery Right 2013  . Breast biopsy    . Cardioversion N/A 08/22/2015    Procedure: CARDIOVERSION;  Surgeon: Thayer Headings, MD;  Location: [facility] ENDOSCOPY;  Service: Cardiovascular;  Laterality: N/A;  . Tee without cardioversion N/A 08/22/2015    Procedure: TRANSESOPHAGEAL ECHOCARDIOGRAM (TEE);  Surgeon: Thayer Headings, MD;  Location: [facility] ENDOSCOPY;  Service: Cardiovascular;  Laterality: N/A;  Darden Dates with cardioversion    . Cardiac catheterization N/A 10/18/2015    Procedure: Right/Left Heart Cath and Coronary Angiography;  Surgeon: Sherren Mocha, MD;  Location: [place] [facility] CV LAB;  Service: Cardiovascular;  Laterality: N/A;  . Minimally invasive maze procedure N/A 10/31/2015    Procedure: MINIMALLY INVASIVE MAZE PROCEDURE;  Surgeon: Rexene Alberts, MD;  Location: [place];  Service: Open Heart Surgery;  Laterality: N/A;  . Tee without cardioversion N/A 10/31/2015    Procedure: TRANSESOPHAGEAL ECHOCARDIOGRAM (TEE);  Surgeon: Rexene Alberts, MD;  Location: [place];  Service: Open Heart Surgery;  Laterality: N/A;  . Mitral valve replacement Right 10/31/2015    Procedure: MINIMALLY INVASIVE MITRAL VALVE (MV) REPLACEMENT;  Surgeon: Rexene Alberts, MD;  Location: [place];  Service: Open Heart Surgery;  Laterality: Right;  . Ep implantable device N/A 11/06/2015    Procedure: Pacemaker Implant;  Surgeon: Evans Lance, MD;  Location: [place] CV LAB;  Service: Cardiovascular;  Laterality: N/A;  . Pacemaker lead removal  02/20/2016  . Ep implantable device N/A 02/20/2016    Procedure: Lead Extraction;  Surgeon: Evans Lance, MD;  Location: [facility] CV LAB;  Service: Cardiovascular;  Laterality: N/A;    History  Smoking status  . Never Smoker   Smokeless tobacco  . Never Used    History  Alcohol Use No    Family History  Problem Relation Age of Onset  . Hypertension Mother   . Arrhythmia Mother   . Heart failure Mother   . Arrhythmia Brother   . Stroke Brother 55    cerebral hemorrhage, nonsmoker, no HTN  . Stroke Father     from an aneurysm  .  Stroke Maternal Aunt 83    cerebral hemorrhage  . Liver cancer Maternal Grandmother   . Atrial fibrillation Son   . Heart attack Neg Hx     Reviw of Systems:  Reviewed in the HPI.  All other systems are negative.  Physical Exam: Blood pressure 110/76, pulse 79, height 5\' 5"  (1.651 m), weight 139 lb 12.8 oz (63.413  kg). General: Well developed, well nourished, in no acute distress. Head: Normocephalic, atraumatic, sclera non-icteric, mucus membranes are moist,  Neck: Supple. Carotids are 2 + without bruits. No JVD Lungs: Clear bilaterally to auscultation. Heart:  RR with occasional premature beats , chest is non tender  Abdomen: Soft, non-tender, non-distended with normal bowel sounds. No hepatomegaly. No rebound/guarding. No masses. Msk:  Strength and tone are normal Extremities: No clubbing or cyanosis. No edema.  Distal pedal pulses are 2+ and equal bilaterally. Neuro: Alert and oriented X 3. Moves all extremities spontaneously. Psych:  Responds to questions appropriately with a normal affect.  ECG: AV pacing at 74.    Assessment / Plan:   1. Persistent Atrial fibrillation with rapid ventricular response: she is maintaining NSR.  She status post MAZE procedure . probalby continue coumadin for a total of 3 months after surgery . Pacemaker interrogation reveals that she has not had any mode switches or any recent episodes of atrial fibrillation. She has an upcoming appointment in the atrial fib clinic. We will cancel appointment for now.  2.  MVP with MR:  Now status post mitral valve replacement. She is doing quite well. She is getting over anesthesia and her energy levels have improved.  3. Congestive heart failure: her CHF symptoms have greatly improved.  4. Pacemaker: She status post pacemaker implantation. Has not had any recent mode switches.    Mertie Moores, MD  04/16/2016 10:35 AM    [address],  [place], [place]  [address] Pager [phone] Phone: [phone]; Fax: [phone]   [facility]  [address] [place], [place]  [address] [phone]   Fax [phone]

## 2016-04-17 ENCOUNTER — Encounter: Payer: Self-pay | Admitting: *Deleted

## 2016-04-17 ENCOUNTER — Telehealth: Payer: Self-pay | Admitting: Pulmonary Disease

## 2016-04-17 ENCOUNTER — Telehealth: Payer: Self-pay | Admitting: Cardiovascular Disease

## 2016-04-17 DIAGNOSIS — R911 Solitary pulmonary nodule: Secondary | ICD-10-CM

## 2016-04-17 NOTE — Telephone Encounter (Signed)
Spoke with patient and advised that per Jinny Blossom, PharmD for CVRR clinic, patient will need an appointment in 1 month for CBC/BMET to check status on eliquis.  She verbalized understanding and agreement and is aware of appointment date and time.

## 2016-04-17 NOTE — Telephone Encounter (Signed)
Spoke with pt. She is supposed to have a CT in September. This will need to be scheduled after 08/19/16, she will be on vacation 2 weeks prior to this. A new order will be placed with this information. She wanted to schedule her ROV with JN but I made her aware that his schedule is not available for October yet. JN's schedule is limited for the end of September. Pt was not satisfied with this. She will continue to call back about this appointment.

## 2016-04-17 NOTE — Telephone Encounter (Signed)
[name] [name] is calling because she would like to Alert that a request is coming from her insurance company about her medication Elquis where it has went Tier 3 to a Tier 4 which is a she has to pay a $198.00 versus a $45 copay . Please call if you have any questions .

## 2016-04-17 NOTE — Telephone Encounter (Signed)
New Message:  Pt called in wanting to know if she had to still go to her Coumadin check on 5/24 since she was starting on her Eliquis today. Please f/u with her.

## 2016-04-17 NOTE — Telephone Encounter (Signed)
Routing to [employer], LPN, Patient Care Advocate

## 2016-04-19 ENCOUNTER — Encounter: Payer: Self-pay | Admitting: Internal Medicine

## 2016-04-23 NOTE — Telephone Encounter (Signed)
Letter from [place] arrived with approval for a Tier exception for Eliquis 5mg . This is in effect through 04/17/2017. Moved to a Tier 1.

## 2016-05-03 ENCOUNTER — Other Ambulatory Visit: Payer: Medicare Other

## 2016-05-03 ENCOUNTER — Ambulatory Visit: Payer: Medicare Other | Admitting: Neurology

## 2016-05-03 ENCOUNTER — Encounter: Payer: Self-pay | Admitting: Internal Medicine

## 2016-05-03 ENCOUNTER — Other Ambulatory Visit (INDEPENDENT_AMBULATORY_CARE_PROVIDER_SITE_OTHER): Payer: Medicare Other

## 2016-05-03 ENCOUNTER — Telehealth: Payer: Self-pay | Admitting: Cardiovascular Disease

## 2016-05-03 ENCOUNTER — Encounter (HOSPITAL_COMMUNITY): Payer: Self-pay | Admitting: Emergency Medicine

## 2016-05-03 ENCOUNTER — Observation Stay (HOSPITAL_COMMUNITY)
Admission: EM | Admit: 2016-05-03 | Discharge: 2016-05-04 | Disposition: A | Discharge status: Home / Self Care | Payer: Medicare Other | Attending: Internal Medicine | Admitting: Internal Medicine

## 2016-05-03 ENCOUNTER — Ambulatory Visit (INDEPENDENT_AMBULATORY_CARE_PROVIDER_SITE_OTHER): Payer: Medicare Other | Admitting: Internal Medicine

## 2016-05-03 ENCOUNTER — Emergency Department (HOSPITAL_COMMUNITY): Payer: Medicare Other

## 2016-05-03 VITALS — BP 102/70 | HR 79 | Ht 65.0 in | Wt 139.8 lb

## 2016-05-03 DIAGNOSIS — R531 Weakness: Secondary | ICD-10-CM

## 2016-05-03 DIAGNOSIS — K764 Peliosis hepatis: Secondary | ICD-10-CM | POA: Diagnosis not present

## 2016-05-03 DIAGNOSIS — I110 Hypertensive heart disease with heart failure: Secondary | ICD-10-CM | POA: Insufficient documentation

## 2016-05-03 DIAGNOSIS — Z953 Presence of xenogenic heart valve: Secondary | ICD-10-CM | POA: Insufficient documentation

## 2016-05-03 DIAGNOSIS — G459 Transient cerebral ischemic attack, unspecified: Principal | ICD-10-CM | POA: Diagnosis present

## 2016-05-03 DIAGNOSIS — M6289 Other specified disorders of muscle: Secondary | ICD-10-CM

## 2016-05-03 DIAGNOSIS — I5022 Chronic systolic (congestive) heart failure: Secondary | ICD-10-CM | POA: Diagnosis present

## 2016-05-03 DIAGNOSIS — I4819 Other persistent atrial fibrillation: Secondary | ICD-10-CM | POA: Diagnosis present

## 2016-05-03 DIAGNOSIS — F419 Anxiety disorder, unspecified: Secondary | ICD-10-CM | POA: Insufficient documentation

## 2016-05-03 DIAGNOSIS — R200 Anesthesia of skin: Secondary | ICD-10-CM | POA: Diagnosis present

## 2016-05-03 DIAGNOSIS — F418 Other specified anxiety disorders: Secondary | ICD-10-CM | POA: Diagnosis present

## 2016-05-03 DIAGNOSIS — E785 Hyperlipidemia, unspecified: Secondary | ICD-10-CM | POA: Diagnosis present

## 2016-05-03 DIAGNOSIS — I480 Paroxysmal atrial fibrillation: Secondary | ICD-10-CM

## 2016-05-03 DIAGNOSIS — Z7901 Long term (current) use of anticoagulants: Secondary | ICD-10-CM | POA: Insufficient documentation

## 2016-05-03 DIAGNOSIS — Z950 Presence of cardiac pacemaker: Secondary | ICD-10-CM | POA: Diagnosis present

## 2016-05-03 DIAGNOSIS — Z79899 Other long term (current) drug therapy: Secondary | ICD-10-CM | POA: Insufficient documentation

## 2016-05-03 DIAGNOSIS — I481 Persistent atrial fibrillation: Secondary | ICD-10-CM | POA: Diagnosis not present

## 2016-05-03 DIAGNOSIS — K7689 Other specified diseases of liver: Secondary | ICD-10-CM | POA: Diagnosis present

## 2016-05-03 DIAGNOSIS — I482 Chronic atrial fibrillation, unspecified: Secondary | ICD-10-CM | POA: Insufficient documentation

## 2016-05-03 LAB — CBC
HCT: 37.1 % (ref 36.0–46.0)
Hemoglobin: 11.7 g/dL — ABNORMAL LOW (ref 12.0–15.0)
MCH: 27.2 pg (ref 26.0–34.0)
MCHC: 31.5 g/dL (ref 30.0–36.0)
MCV: 86.3 fL (ref 78.0–100.0)
Platelets: 109 10*3/uL — ABNORMAL LOW (ref 150–400)
RBC: 4.3 MIL/uL (ref 3.87–5.11)
RDW: 17.1 % — ABNORMAL HIGH (ref 11.5–15.5)
WBC: 5.2 10*3/uL (ref 4.0–10.5)

## 2016-05-03 LAB — COMPREHENSIVE METABOLIC PANEL
ALT: 18 U/L (ref 0–35)
ALT: 22 U/L (ref 14–54)
AST: 25 U/L (ref 0–37)
AST: 29 U/L (ref 15–41)
Albumin: 4.5 g/dL (ref 3.5–5.2)
Albumin: 4 g/dL (ref 3.5–5.0)
Alkaline Phosphatase: 76 U/L (ref 39–117)
Alkaline Phosphatase: 70 U/L (ref 38–126)
Anion gap: 6 (ref 5–15)
BUN: 26 mg/dL — ABNORMAL HIGH (ref 6–23)
BUN: 23 mg/dL — ABNORMAL HIGH (ref 6–20)
CO2: 29 mEq/L (ref 19–32)
CO2: 27 mmol/L (ref 22–32)
Calcium: 9.5 mg/dL (ref 8.4–10.5)
Calcium: 9.5 mg/dL (ref 8.9–10.3)
Chloride: 105 mEq/L (ref 96–112)
Chloride: 106 mmol/L (ref 101–111)
Creatinine, Ser: 0.83 mg/dL (ref 0.40–1.20)
Creatinine, Ser: 0.89 mg/dL (ref 0.44–1.00)
GFR calc Af Amer: >60 mL/min (ref >60)
GFR calc non Af Amer: >60 mL/min (ref >60)
GFR: 71.13 mL/min (ref >60.00)
Glucose, Bld: 95 mg/dL (ref 70–99)
Glucose, Bld: 136 mg/dL — ABNORMAL HIGH (ref 65–99)
Potassium: 4.3 mEq/L (ref 3.5–5.1)
Potassium: 3.7 mmol/L (ref 3.5–5.1)
Sodium: 142 mEq/L (ref 135–145)
Sodium: 139 mmol/L (ref 135–145)
Total Bilirubin: 0.5 mg/dL (ref 0.2–1.2)
Total Bilirubin: 0.6 mg/dL (ref 0.3–1.2)
Total Protein: 7 g/dL (ref 6.0–8.3)
Total Protein: 6.6 g/dL (ref 6.5–8.1)

## 2016-05-03 LAB — CBC WITH DIFFERENTIAL/PLATELET
Basophils Absolute: 0 10*3/uL (ref 0.0–0.1)
Basophils Relative: 0.5 % (ref 0.0–3.0)
Eosinophils Absolute: 0.2 10*3/uL (ref 0.0–0.7)
Eosinophils Relative: 3.8 % (ref 0.0–5.0)
HCT: 36.4 % (ref 36.0–46.0)
Hemoglobin: 12.1 g/dL (ref 12.0–15.0)
Lymphocytes Relative: 25.3 % (ref 12.0–46.0)
Lymphs Abs: 1.5 10*3/uL (ref 0.7–4.0)
MCHC: 33.2 g/dL (ref 30.0–36.0)
MCV: 85.4 fl (ref 78.0–100.0)
Monocytes Absolute: 0.4 10*3/uL (ref 0.1–1.0)
Monocytes Relative: 6.1 % (ref 3.0–12.0)
Neutro Abs: 3.7 10*3/uL (ref 1.4–7.7)
Neutrophils Relative %: 64.3 % (ref 43.0–77.0)
Platelets: 122 10*3/uL — ABNORMAL LOW (ref 150.0–400.0)
RBC: 4.26 Mil/uL (ref 3.87–5.11)
RDW: 18.3 % — ABNORMAL HIGH (ref 11.5–15.5)
WBC: 5.8 10*3/uL (ref 4.0–10.5)

## 2016-05-03 LAB — DIFFERENTIAL
Basophils Absolute: 0 10*3/uL (ref 0.0–0.1)
Basophils Relative: 1 %
Eosinophils Absolute: 0.2 10*3/uL (ref 0.0–0.7)
Eosinophils Relative: 4 %
Lymphocytes Relative: 25 %
Lymphs Abs: 1.3 10*3/uL (ref 0.7–4.0)
Monocytes Absolute: 0.3 10*3/uL (ref 0.1–1.0)
Monocytes Relative: 6 %
Neutro Abs: 3.3 10*3/uL (ref 1.7–7.7)
Neutrophils Relative %: 64 %

## 2016-05-03 LAB — PROTIME-INR
INR: 1.3 ratio — ABNORMAL HIGH (ref 0.8–1.0)
INR: 1.29 (ref 0.00–1.49)
Prothrombin Time: 13.8 s — ABNORMAL HIGH (ref 9.6–13.1)
Prothrombin Time: 16.2 seconds — ABNORMAL HIGH (ref 11.6–15.2)

## 2016-05-03 LAB — APTT: aPTT: 31 seconds (ref 24–37)

## 2016-05-03 LAB — HEPATITIS B SURFACE ANTIGEN: Hepatitis B Surface Ag: NEGATIVE

## 2016-05-03 LAB — I-STAT TROPONIN, ED: Troponin i, poc: 0.01 ng/mL (ref 0.00–0.08)

## 2016-05-03 LAB — HEPATITIS B CORE ANTIBODY, TOTAL: Hep B Core Total Ab: NONREACTIVE

## 2016-05-03 LAB — HEPATITIS A ANTIBODY, TOTAL: Hep A Total Ab: NONREACTIVE

## 2016-05-03 LAB — HEPATITIS B SURFACE ANTIBODY,QUALITATIVE: Hep B S Ab: NEGATIVE

## 2016-05-03 LAB — HEPATITIS C ANTIBODY: HCV Ab: NEGATIVE

## 2016-05-03 MED ORDER — CO Q 10 100 MG PO CAPS: 100.0000 mg | ORAL_CAPSULE | Freq: Every day | ORAL | Status: DC

## 2016-05-03 MED ORDER — TRAMADOL HCL 50 MG PO TABS: 50.0000 mg | ORAL_TABLET | ORAL | Status: DC | PRN

## 2016-05-03 MED ORDER — ALBUTEROL SULFATE (2.5 MG/3ML) 0.083% IN NEBU: 2.5000 mg | INHALATION_SOLUTION | Freq: Four times a day (QID) | RESPIRATORY_TRACT | Status: DC | PRN

## 2016-05-03 MED ORDER — STROKE: EARLY STAGES OF RECOVERY BOOK
Freq: Once | Status: AC
  Administered 2016-05-03: 22:00:00

## 2016-05-03 MED ORDER — VITAMIN D 50 MCG (2000 UT) PO CAPS: 2000.0000 [IU] | ORAL_CAPSULE | Freq: Every day | ORAL | Status: DC

## 2016-05-03 MED ORDER — ALPRAZOLAM 0.25 MG PO TABS: 0.2500 mg | ORAL_TABLET | Freq: Every evening | ORAL | Status: DC | PRN

## 2016-05-03 MED ORDER — B COMPLEX PO TABS: 1.0000 | ORAL_TABLET | Freq: Every day | ORAL | Status: DC

## 2016-05-03 MED ORDER — APIXABAN 5 MG PO TABS
5.0000 mg | ORAL_TABLET | Freq: Two times a day (BID) | ORAL | Status: DC
  Administered 2016-05-03 – 2016-05-04 (×2): 5 mg via ORAL
  Filled 2016-05-03 (×2): qty 1

## 2016-05-03 MED ORDER — B COMPLEX-C PO TABS
1.0000 | ORAL_TABLET | Freq: Every day | ORAL | Status: DC
  Administered 2016-05-04: 1 via ORAL
  Filled 2016-05-03: qty 1

## 2016-05-03 MED ORDER — VITAMIN D 1000 UNITS PO TABS
2000.0000 [IU] | ORAL_TABLET | Freq: Every day | ORAL | Status: DC
  Administered 2016-05-04: 2000 [IU] via ORAL
  Filled 2016-05-03: qty 2

## 2016-05-03 MED ORDER — IOPAMIDOL (ISOVUE-370) INJECTION 76%
INTRAVENOUS | Status: AC
Start: 2016-05-03 — End: 2016-05-03
  Administered 2016-05-03: 50 mL
  Filled 2016-05-03: qty 50

## 2016-05-03 MED ORDER — AZELASTINE HCL 0.1 % NA SOLN
1.0000 | NASAL | Status: DC | PRN
  Filled 2016-05-03: qty 30

## 2016-05-03 MED ORDER — METOPROLOL TARTRATE 12.5 MG HALF TABLET
12.5000 mg | ORAL_TABLET | Freq: Two times a day (BID) | ORAL | Status: DC
  Administered 2016-05-03 – 2016-05-04 (×2): 12.5 mg via ORAL
  Filled 2016-05-03 (×2): qty 1

## 2016-05-03 MED ORDER — ROSUVASTATIN CALCIUM 5 MG PO TABS
10.0000 mg | ORAL_TABLET | Freq: Every day | ORAL | Status: DC
  Administered 2016-05-04: 10 mg via ORAL
  Filled 2016-05-03: qty 2

## 2016-05-03 MED ORDER — BUTALBITAL-APAP-CAFFEINE 50-325-40 MG PO TABS: 1.0000 | ORAL_TABLET | Freq: Four times a day (QID) | ORAL | Status: DC | PRN

## 2016-05-03 MED ORDER — MONTELUKAST SODIUM 10 MG PO TABS
10.0000 mg | ORAL_TABLET | Freq: Every day | ORAL | Status: DC
  Administered 2016-05-03: 10 mg via ORAL
  Filled 2016-05-03: qty 1

## 2016-05-03 NOTE — ED Provider Notes (Signed)
CSN: [id number]     Arrival date & time 05/03/16  1154 History   First MD Initiated Contact with Patient 05/03/16 1549     Chief Complaint  Patient presents with  . Numbness     (Consider location/radiation/quality/duration/timing/severity/associated sxs/prior Treatment) HPI  Patient is a 75 year old female with past medical history of hyperlipidemia, A. fib (onEliquis), pacemaker, mitral valve prolapse and CHF who presents to the ED with complaint of numbness, onset yesterday afternoon around 12:30 PM. Patient reports while she was driving home yesterday she had sudden onset of paralysis and numbness to her left foot extending to her left mid calf. She reports after onset of symptoms to her left leg she began to have mild numbness to her left hand. She notes the symptoms lasted for approximately 5 minutes and then resolved spontaneously. Patient reports she was changed from Coumadin to Eliquis on May 16. Denies fever, chills, headache, visual changes, lightheadedness, dizziness, cough, shortness of breath, chest pain, abdominal pain, nausea, vomiting, diarrhea, urinary symptoms, syncope, seizure. Patient denies return of symptoms since initial episode yesterday afternoon. She notes she was seen by her GI doctor earlier today and was advised to come to the ED for further evaluation.  Past Medical History  Diagnosis Date  . SVT (supraventricular tachycardia) ([place])   . MVP (mitral valve prolapse)   . Hyperlipidemia   . Idiopathic thrombocytopenic purpura (ITP)   . Hemorrhoid   . IBS (irritable bowel syndrome)   . Migraine   . Severe mitral regurgitation   . RBBB   . Anxiety associated with depression     Prn alprazolam   . Thoracic aorta atherosclerosis ([place])   . Nodule of right lung   . Restrictive lung disease     Mild on PFT & likely cardiac in etiology   . 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 Atrial fibrillation, persistent ([place])     DCCV 08/22/2015  . Acute on chronic diastolic  (congestive) heart failure ([place])   . S/P minimally invasive mitral valve replacement with bioprosthetic valve 10/31/2015    33 mm [facility] Mitral bovine bioprosthetic tissue valve placed via right mini thoracotomy approach  . S/P Minimally invasive maze operation for atrial fibrillation 10/31/2015    Complete bilateral atrial lesion set using cryothermy and bipolar radiofrequency ablation with clipping of LA appendage via right mini thoracotomy approach  . Bradycardia post-op bradycardia, pacer dependent    MDT PPM 11/06/15, Dr. Lovena Le  . Diverticulosis   . Hepatic cyst     innumerable   Past Surgical History  Procedure Laterality Date  . Tubal ligation    . Foot surgery    . Varicose vein surgery Right   . Mandible fracture surgery  03-26-13  . Colonoscopy  2003  . Eye surgery Right 2013  . Breast biopsy    . Cardioversion N/A 08/22/2015    Procedure: CARDIOVERSION;  Surgeon: Thayer Headings, MD;  Location[facility] ENDOSCOPY;  Service: Cardiovascular;  Laterality: N/A;  . Tee without cardioversion N/A 08/22/2015    Procedure: TRANSESOPHAGEAL ECHOCARDIOGRAM (TEE);  Surgeon: Thayer Headings, MD;  Location: [facility] ENDOSCOPY;  Service: Cardiovascular;  Laterality: N/A;  Darden Dates with cardioversion    . Cardiac catheterization N/A 10/18/2015    Procedure: Right/Left Heart Cath and Coronary Angiography;  Surgeon: Sherren Mocha, MD;  Location: [place] CV LAB;  Service: Cardiovascular;  Laterality: N/A;  . Minimally invasive maze procedure N/A 10/31/2015    Procedure: MINIMALLY INVASIVE MAZE PROCEDURE;  Surgeon:  Rexene Alberts, MD;  Location: [place];  Service: Open Heart Surgery;  Laterality: N/A;  . Tee without cardioversion N/A 10/31/2015    Procedure: TRANSESOPHAGEAL ECHOCARDIOGRAM (TEE);  Surgeon: Rexene Alberts, MD;  Location: [place];  Service: Open Heart Surgery;  Laterality: N/A;  . Mitral valve replacement Right 10/31/2015    Procedure: MINIMALLY INVASIVE MITRAL VALVE (MV) REPLACEMENT;   Surgeon: Rexene Alberts, MD;  Location: [place];  Service: Open Heart Surgery;  Laterality: Right;  . Ep implantable device N/A 11/06/2015    Procedure: Pacemaker Implant;  Surgeon: Evans Lance, MD;  Location: [place] CV LAB;  Service: Cardiovascular;  Laterality: N/A;  . Pacemaker lead removal  02/20/2016  . Ep implantable device N/A 02/20/2016    Procedure: Lead Extraction;  Surgeon: Evans Lance, MD;  Location: [place] CV LAB;  Service: Cardiovascular;  Laterality: N/A;   Family History  Problem Relation Age of Onset  . Hypertension Mother   . Arrhythmia Mother   . Heart failure Mother   . Arrhythmia Brother   . Stroke Brother 88    cerebral hemorrhage, nonsmoker, no HTN  . Prostate cancer Brother   . Stroke Father     from an aneurysm  . Stroke Maternal Aunt 83    cerebral hemorrhage  . Liver cancer Maternal Grandmother   . Atrial fibrillation Son   . Celiac disease Son   . Heart attack Neg Hx    Social History  Substance Use Topics  . Smoking status: Never Smoker   . Smokeless tobacco: Never Used  . Alcohol Use: No   OB History    Gravida Para Term Preterm AB TAB SAB Ectopic Multiple Living   2               Obstetric Comments   1st Menstrual Cycle:  10  1st Pregnancy:  22     Review of Systems  Neurological: Positive for weakness and numbness.  All other systems reviewed and are negative.     Allergies  Meloxicam; Codeine; Doxycycline; Hydrocodone; Hydromorphone; and Oxycodone  Home Medications   Prior to Admission medications   Medication Sig Start Date End Date Taking? Authorizing Provider  albuterol (PROVENTIL HFA;VENTOLIN HFA) 108 (90 BASE) MCG/ACT inhaler Inhale 2 puffs into the lungs every 6 (six) hours as needed for wheezing or shortness of breath. 08/15/15  Yes Thayer Headings, MD  ALPRAZolam Duanne Moron) 0.25 MG tablet Take 1 tablet (0.25 mg total) by mouth at bedtime as needed for anxiety or sleep. 08/18/15  Yes [name], MD   amoxicillin (AMOXIL) 500 MG tablet Take 4 tablets (2,000 mg total) by mouth once. Take 4 tablets 60 minutes prior to dental work 11/23/15  Yes Eileen Stanford, PA-C  apixaban (ELIQUIS) 5 MG TABS tablet Take 1 tablet (5 mg total) by mouth 2 (two) times daily. 04/16/16  Yes Thayer Headings, MD  azelastine (ASTELIN) 0.1 % nasal spray Place 1 spray into both nostrils as needed for allergies.  09/05/15  Yes Historical Provider, MD  b complex vitamins tablet Take 1 tablet by mouth daily.   Yes Historical Provider, MD  butalbital-acetaminophen-caffeine (FIORICET, ESGIC) 50-325-40 MG tablet TAKE 1 TABLET BY MOUTH AS NEEDED FOR BACK PAIN OR HEADACHE 02/09/16  Yes [name], MD  Cholecalciferol (VITAMIN D) 2000 units CAPS Take 2,000 Units by mouth daily.   Yes Historical Provider, MD  Coenzyme Q10 (CO Q 10) 100 MG CAPS Take 100 mg  by mouth daily.   Yes Historical Provider, MD  furosemide (LASIX) 40 MG tablet Take 1 tablet (40 mg total) by mouth daily as needed for fluid. Patient taking differently: Take 40 mg by mouth daily as needed for fluid (As needed for weight gain <2lb).  04/16/16  Yes Thayer Headings, MD  metoprolol tartrate (LOPRESSOR) 25 MG tablet Take 0.5 tablets (12.5 mg total) by mouth 2 (two) times daily. 01/12/16  Yes Thayer Headings, MD  montelukast (SINGULAIR) 10 MG tablet Take 10 mg by mouth at bedtime.    Yes Historical Provider, MD  rosuvastatin (CRESTOR) 10 MG tablet Take 1 tablet (10 mg total) by mouth daily. 02/19/16  Yes [name], MD  traMADol (ULTRAM) 50 MG tablet Take 1-2 tablets (50-100 mg total) by mouth every 4 (four) hours as needed for moderate pain. 11/09/15  Yes Gina L Collins, PA-C   BP 113/71 mmHg  Pulse 76  Temp(Src) 97.7 F (36.5 C) (Oral)  Resp 20  SpO2 98%  LMP  (Exact Date) Physical Exam  Constitutional: She is oriented to person, place, and time. She appears well-developed and well-nourished. No distress.  HENT:  Head: Normocephalic and atraumatic.   Mouth/Throat: Oropharynx is clear and moist. No oropharyngeal exudate.  Eyes: Conjunctivae and EOM are normal. Pupils are equal, round, and reactive to light. Right eye exhibits no discharge. Left eye exhibits no discharge. No scleral icterus.  Neck: Normal range of motion. Neck supple.  Cardiovascular: Normal rate, regular rhythm, normal heart sounds and intact distal pulses.   Pulmonary/Chest: Effort normal and breath sounds normal. No respiratory distress. She has no wheezes. She has no rales. She exhibits no tenderness.  Abdominal: Soft. Bowel sounds are normal. She exhibits no distension and no mass. There is no tenderness. There is no rebound and no guarding.  Musculoskeletal: Normal range of motion. She exhibits no edema or tenderness.  Full range of motion of neck and back. Full range of motion of bilateral upper and lower extremities, with 5/5 strength. Sensation intact. 2+ radial and PT pulses. Cap refill <2 seconds. No lower extremity swelling.    Lymphadenopathy:    She has no cervical adenopathy.  Neurological: She is alert and oriented to person, place, and time. She has normal strength. No cranial nerve deficit or sensory deficit. She displays a negative Romberg sign. Coordination normal.  Skin: Skin is warm and dry. She is not diaphoretic.  Nursing note and vitals reviewed.   ED Course  Procedures (including critical care time) Labs Review Labs Reviewed  PROTIME-INR - Abnormal; Notable for the following:    Prothrombin Time 16.2 (*)    All other components within normal limits  CBC - Abnormal; Notable for the following:    Hemoglobin 11.7 (*)    RDW 17.1 (*)    Platelets 109 (*)    All other components within normal limits  COMPREHENSIVE METABOLIC PANEL - Abnormal; Notable for the following:    Glucose, Bld 136 (*)    BUN 23 (*)    All other components within normal limits  APTT  DIFFERENTIAL  I-STAT TROPOININ, ED  CBG MONITORING, ED    Imaging Review Ct Angio  Head W/cm &/or Wo Cm  05/03/2016  CLINICAL DATA:  Transient episode of numbness and weakness to the left upper and lower extremity yesterday lasting 5 minutes. Symptoms have since resolved. EXAM: CT ANGIOGRAPHY HEAD AND NECK TECHNIQUE: Multidetector CT imaging of the head and neck was performed using the standard  protocol during bolus administration of intravenous contrast. Multiplanar CT image reconstructions and MIPs were obtained to evaluate the vascular anatomy. Carotid stenosis measurements (when applicable) are obtained utilizing NASCET criteria, using the distal internal carotid diameter as the denominator. COMPARISON:  CT head without contrast from the same day. FINDINGS: CTA NECK Aortic arch: A 3 vessel arch configuration is present. Minimal atherosclerotic calcifications are present at the aortic arch without significant stenosis. Right carotid system: The right common carotid artery is mildly tortuous. The bifurcation is unremarkable. Mild tortuosity is present in the cervical right ICA without significant stenosis. Left carotid system: The left common carotid artery is within normal limits. Calcifications are present at the carotid bifurcation without significant stenosis. Focal tortuosity is present proximally in the cervical left ICA without a significant stenosis. Vertebral arteries:The vertebral arteries originate from the subclavian arteries bilaterally without significant stenosis. There is mild tortuosity proximally. The right vertebral artery is the dominant vessel. There is no focal stenosis of either vertebral artery in the neck. Skeleton: Vertebral body heights and alignment are normal. No focal lytic or blastic lesions are present. ORIF of the mandible is noted. Other neck: No focal mucosal or submucosal lesions are present. Vocal cords are midline and symmetric. The tongue base is unremarkable. Salivary glands are within normal limits. Mildly enlarged right level 2 lymph nodes measure up to  9 mm. No other significant adenopathy is present. CTA HEAD Anterior circulation: Atherosclerotic calcifications are present through the cavernous internal carotid arteries bilaterally without a significant stenosis. The A1 and M1 segments are normal. The anterior communicating artery is patent. MCA bifurcations are intact. Mild diffuse small vessel irregularity is present throughout the anterior circulation. Posterior circulation: PICA origins are visualized and normal bilaterally. The basilar artery is normal. Both posterior cerebral arteries originate from the basilar tip. A left posterior communicating artery contributes. PCA branch vessels are intact bilaterally. Venous sinuses: The dural sinuses are patent. The transverse sinuses are codominant. The straight sinus and deep cerebral veins are intact. The cortical veins are unremarkable. Anatomic variants: None. Delayed phase: The postcontrast images demonstrate no pathologic enhancement. IMPRESSION: 1. Atherosclerotic changes at the left carotid bifurcation without significant stenosis. 2. Tortuosity of the cervical internal carotid arteries bilaterally, more prominent on the left. There is no associated stenosis. 3. Mildly enlarged right level 2 lymph nodes are nonspecific. No primary lesion is present to suggest this is metastatic disease. Lymphoma is also considered. These are most likely reactive. 4. Atherosclerotic changes within the cavernous internal carotid arteries bilaterally without significant stenosis. 5. Mild diffuse intracranial small vessel disease likely reflecting atherosclerotic change. No significant proximal stenosis, aneurysm, or branch vessel occlusion within the circle of Willis. Electronically Signed   By: San Morelle M.D.   On: 05/03/2016 19:43   Ct Head Wo Contrast  05/03/2016  CLINICAL DATA:  Left-sided numbness yesterday. EXAM: CT HEAD WITHOUT CONTRAST TECHNIQUE: Contiguous axial images were obtained from the base of the  skull through the vertex without intravenous contrast. COMPARISON:  March 26, 2013 FINDINGS: Paranasal sinuses, mastoid air cells, and middle ears, bones, and extracranial soft tissues demonstrate no acute abnormalities. Unusual shape of the right globe, unchanged and of no acute significance. No subdural, epidural, or subarachnoid hemorrhage. Ventricles and sulci are normal for age. Minimal white matter changes. No acute cortical ischemia or infarct. No mass, mass effect, or midline shift. The cerebellum, brainstem, and basal cisterns are within normal limits. IMPRESSION: No acute abnormalities Electronically Signed   By: Shanon Brow  Jimmye Norman III M.D   On: 05/03/2016 13:19   Ct Angio Neck W/cm &/or Wo/cm  05/03/2016  CLINICAL DATA:  Transient episode of numbness and weakness to the left upper and lower extremity yesterday lasting 5 minutes. Symptoms have since resolved. EXAM: CT ANGIOGRAPHY HEAD AND NECK TECHNIQUE: Multidetector CT imaging of the head and neck was performed using the standard protocol during bolus administration of intravenous contrast. Multiplanar CT image reconstructions and MIPs were obtained to evaluate the vascular anatomy. Carotid stenosis measurements (when applicable) are obtained utilizing NASCET criteria, using the distal internal carotid diameter as the denominator. COMPARISON:  CT head without contrast from the same day. FINDINGS: CTA NECK Aortic arch: A 3 vessel arch configuration is present. Minimal atherosclerotic calcifications are present at the aortic arch without significant stenosis. Right carotid system: The right common carotid artery is mildly tortuous. The bifurcation is unremarkable. Mild tortuosity is present in the cervical right ICA without significant stenosis. Left carotid system: The left common carotid artery is within normal limits. Calcifications are present at the carotid bifurcation without significant stenosis. Focal tortuosity is present proximally in the cervical  left ICA without a significant stenosis. Vertebral arteries:The vertebral arteries originate from the subclavian arteries bilaterally without significant stenosis. There is mild tortuosity proximally. The right vertebral artery is the dominant vessel. There is no focal stenosis of either vertebral artery in the neck. Skeleton: Vertebral body heights and alignment are normal. No focal lytic or blastic lesions are present. ORIF of the mandible is noted. Other neck: No focal mucosal or submucosal lesions are present. Vocal cords are midline and symmetric. The tongue base is unremarkable. Salivary glands are within normal limits. Mildly enlarged right level 2 lymph nodes measure up to 9 mm. No other significant adenopathy is present. CTA HEAD Anterior circulation: Atherosclerotic calcifications are present through the cavernous internal carotid arteries bilaterally without a significant stenosis. The A1 and M1 segments are normal. The anterior communicating artery is patent. MCA bifurcations are intact. Mild diffuse small vessel irregularity is present throughout the anterior circulation. Posterior circulation: PICA origins are visualized and normal bilaterally. The basilar artery is normal. Both posterior cerebral arteries originate from the basilar tip. A left posterior communicating artery contributes. PCA branch vessels are intact bilaterally. Venous sinuses: The dural sinuses are patent. The transverse sinuses are codominant. The straight sinus and deep cerebral veins are intact. The cortical veins are unremarkable. Anatomic variants: None. Delayed phase: The postcontrast images demonstrate no pathologic enhancement. IMPRESSION: 1. Atherosclerotic changes at the left carotid bifurcation without significant stenosis. 2. Tortuosity of the cervical internal carotid arteries bilaterally, more prominent on the left. There is no associated stenosis. 3. Mildly enlarged right level 2 lymph nodes are nonspecific. No primary  lesion is present to suggest this is metastatic disease. Lymphoma is also considered. These are most likely reactive. 4. Atherosclerotic changes within the cavernous internal carotid arteries bilaterally without significant stenosis. 5. Mild diffuse intracranial small vessel disease likely reflecting atherosclerotic change. No significant proximal stenosis, aneurysm, or branch vessel occlusion within the circle of Willis. Electronically Signed   By: San Morelle M.D.   On: 05/03/2016 19:43   I have personally reviewed and evaluated these images and lab results as part of my medical decision-making.   EKG Interpretation None      MDM   Final diagnoses:  Transient cerebral ischemia, unspecified transient cerebral ischemia type   Patient presents with sudden onset of numbness and paralysis to left foot and numbness of left  hand that lasted approximately 5 minutes yesterday afternoon and resolved spontaneously. History of A. fib (on Eliquis), pacemaker. VSS. Exam unremarkable. No neuro deficits. INR 1.29. Remaining labs unremarkable. CT head negative.  Consulted neurology. Dr. Silverio Decamp advised to order CT angio head and neck for further evaluation.  CT angio showed atherosclerotic changes at the left carotid bifurcation without significant stenosis, tortuosity of the cervical internal carotid arteries bilaterally more prominent on the left, atherosclerotic changes within the cavernous internal carotid arteries bilaterally without significant stenosis, mild diffuse intracranial small vessel disease likely reflecting atherosclerotic change. Dr. Nicole Kindred advised to admit pt for suspected TIA. Consulted hospitalist. Dr. Blaine Hamper agrees to admission. Orders placed for obs tele bed. Discussed results and plan for admission with pt.     Chesley Noon [employer], [place] 05/03/16 2034  Leo Grosser, MD 05/04/16 [id number]

## 2016-05-03 NOTE — Telephone Encounter (Signed)
Spoke with patient who states yesterday she had sudden left hand and foot weakness and was unable to move to get out of the car; states symptoms resolved after sitting for approximately 5 minutes.  She is concerned that it was a TIA and would like to have lab work today to check her blood levels since switching from coumadin to eliquis.  She states she drove home 1 1/2 hours after this episode and has not had any problems since.  She denies difficulty speaking, walking, or weakness.  She states she did not have any symptoms prior to this occurrence yesterday.  I advised that I can order lab work for her to get at our office.  She does not feel that she needs to see Dr. Acie Fredrickson unless he feels it is necessary.  I ordered pt/inr, CBC, and bmet and scheduled a lab appointment for today.  She is currently at an appointment for evaluation of a liver cyst.  She verbalized understanding and agreement with plan.

## 2016-05-03 NOTE — Telephone Encounter (Signed)
Noted, agree with the plan.

## 2016-05-03 NOTE — Consult Note (Signed)
Admission H&P    Chief Complaint: Transient numbness and weakness of left upper and lower extremities.  HPI: [name] [name] is an 75 y.o. 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 In terms lasted about 5 minutes then zone without intervention. There's been no recurrence of deficits. She has been on anticoagulation with Eliquis. CT scan of her head showed no acute intracranial abnormality. Mild small vessel ischemic changes were noted. CT angiogram of head and neck showed atherosclerotic changes as well as tortuosity of her vertical internal carotid bilaterally. No large vessel occlusion or significant stenosis was seen. NIH stroke score at the time of this evaluation was 0.  LSN: 12:30 PM on 05/02/2016 tPA Given: No: Deficits rapidly resolved mRankin:  Past Medical History  Diagnosis Date  . SVT (supraventricular tachycardia) ([place])   . MVP (mitral valve prolapse)   . Hyperlipidemia   . Idiopathic thrombocytopenic purpura (ITP)   . Hemorrhoid   . IBS (irritable bowel syndrome)   . Migraine   . Severe mitral regurgitation   . RBBB   . Anxiety associated with depression     Prn alprazolam   . Thoracic aorta atherosclerosis ([place])   . Nodule of right lung   . Restrictive lung disease     Mild on PFT & likely cardiac in etiology   . 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 Atrial fibrillation, persistent ([place])     DCCV 08/22/2015  . Acute on chronic diastolic (congestive) heart failure ([place])   . 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 S/P Minimally invasive maze operation for atrial fibrillation 10/31/2015    Complete  bilateral atrial lesion set using cryothermy and bipolar radiofrequency ablation with clipping of LA appendage via right mini thoracotomy approach  . Bradycardia post-op bradycardia, pacer dependent    MDT PPM 11/06/15, Dr. Lovena Le  . Diverticulosis   . Hepatic cyst     innumerable    Past Surgical History  Procedure Laterality Date  . Tubal ligation    . Foot surgery    . Varicose vein surgery Right   . Mandible fracture surgery  03-26-13  . Colonoscopy  2003  . Eye surgery Right 2013  . Breast biopsy    . Cardioversion N/A 08/22/2015    Procedure: CARDIOVERSION;  Surgeon: Thayer Headings, MD;  Location: [facility] ENDOSCOPY;  Service: Cardiovascular;  Laterality: N/A;  . Tee without cardioversion N/A 08/22/2015    Procedure: TRANSESOPHAGEAL ECHOCARDIOGRAM (TEE);  Surgeon: Thayer Headings, MD;  Location: [facility] ENDOSCOPY;  Service: Cardiovascular;  Laterality: N/A;  Darden Dates with cardioversion    . Cardiac catheterization N/A 10/18/2015    Procedure: Right/Left Heart Cath and Coronary Angiography;  Surgeon: Sherren Mocha, MD;  Location: [place] CV LAB;  Service: Cardiovascular;  Laterality: N/A;  . Minimally invasive maze procedure N/A 10/31/2015    Procedure: MINIMALLY INVASIVE MAZE PROCEDURE;  Surgeon: Rexene Alberts, MD;  Location: [place];  Service: Open Heart Surgery;  Laterality: N/A;  . Tee without cardioversion N/A 10/31/2015    Procedure: TRANSESOPHAGEAL ECHOCARDIOGRAM (TEE);  Surgeon: Rexene Alberts, MD;  Location: [place];  Service: Open Heart Surgery;  Laterality: N/A;  . Mitral  valve replacement Right 10/31/2015    Procedure: MINIMALLY INVASIVE MITRAL VALVE (MV) REPLACEMENT;  Surgeon: Rexene Alberts, MD;  Location: [place];  Service: Open Heart Surgery;  Laterality: Right;  . Ep implantable device N/A 11/06/2015    Procedure: Pacemaker Implant;  Surgeon: Evans Lance, MD;  Location: [place] CV LAB;  Service: Cardiovascular;  Laterality: N/A;  . Pacemaker lead removal  02/20/2016  .  Ep implantable device N/A 02/20/2016    Procedure: Lead Extraction;  Surgeon: Evans Lance, MD;  Location: [place] CV LAB;  Service: Cardiovascular;  Laterality: N/A;    Family History  Problem Relation Age of Onset  . Hypertension Mother   . Arrhythmia Mother   . Heart failure Mother   . Arrhythmia Brother   . Stroke Brother 46    cerebral hemorrhage, nonsmoker, no HTN  . Prostate cancer Brother   . Stroke Father     from an aneurysm  . Stroke Maternal Aunt 83    cerebral hemorrhage  . Liver cancer Maternal Grandmother   . Atrial fibrillation Son   . Celiac disease Son   . Heart attack Neg Hx    Social History:  reports that she has never smoked. She has never used smokeless tobacco. She reports that she does not drink alcohol or use illicit drugs.  Allergies:  Allergies  Allergen Reactions  . Meloxicam Other (See Comments)    Severe reflux  . Codeine Nausea And Vomiting    migraine  . Doxycycline Itching and Swelling    Facial  . Hydrocodone Nausea And Vomiting    MIGRAINE  . Hydromorphone Nausea And Vomiting    migraine  . Oxycodone Nausea And Vomiting    Severe migraine    Medications: Preadmission medications were reviewed by me.  ROS: History obtained from the patient  General ROS: negative for - chills, fatigue, fever, night sweats, weight gain or weight loss Psychological ROS: negative for - behavioral disorder, hallucinations, memory difficulties, mood swings or suicidal ideation Ophthalmic ROS: negative for - blurry vision, double vision, eye pain or loss of vision ENT ROS: negative for - epistaxis, nasal discharge, oral lesions, sore throat, tinnitus or vertigo Allergy and Immunology ROS: negative for - hives or itchy/watery eyes Hematological and Lymphatic ROS: negative for - bleeding problems, bruising or swollen lymph nodes Endocrine ROS: negative for - galactorrhea, hair pattern changes, polydipsia/polyuria or temperature  intolerance Respiratory ROS: negative for - cough, hemoptysis, shortness of breath or wheezing Cardiovascular ROS: negative for - chest pain, dyspnea on exertion, edema or irregular heartbeat Gastrointestinal ROS: negative for - abdominal pain, diarrhea, hematemesis, nausea/vomiting or stool incontinence Genito-Urinary ROS: negative for - dysuria, hematuria, incontinence or urinary frequency/urgency Musculoskeletal ROS: negative for - joint swelling or muscular weakness Neurological ROS: as noted in HPI Dermatological ROS: negative for rash and skin lesion changes  Physical Examination: Blood pressure 113/71, pulse 76, temperature 97.7 F (36.5 C), temperature source Oral, resp. rate 20, SpO2 98 %.  HEENT-  Normocephalic, no lesions, without obvious abnormality.  Normal external eye and conjunctiva.  Normal TM's bilaterally.  Normal auditory canals and external ears. Normal external nose, mucus membranes and septum.  Normal pharynx. Neck supple with no masses, nodes, nodules or enlargement. Cardiovascular - regular rate and rhythm, S1, S2 normal, no murmur, click, rub or gallop Lungs - chest clear, no wheezing, rales, normal symmetric air entry Abdomen - soft, non-tender; bowel sounds normal; no masses,  no organomegaly Extremities - no joint  deformities, effusion, or inflammation and no edema  Neurologic Examination: Mental Status: Alert, oriented, thought content appropriate.  Speech fluent without evidence of aphasia. Able to follow commands without difficulty. Cranial Nerves: II-Visual fields were normal. III/IV/VI-Pupils were equal and reacted normally to light. Extraocular movements were full and conjugate.    V/VII-no facial numbness and no facial weakness. VIII-normal. X-normal speech and symmetrical palatal movement. XI: trapezius strength/neck flexion strength normal bilaterally XII-midline tongue extension with normal strength. Motor: 5/5 bilaterally with normal tone and  bulk Sensory: Normal throughout. Deep Tendon Reflexes: 2+ and symmetric. Plantars: Flexor bilaterally Cerebellar: Normal finger-to-nose testing. Carotid auscultation: Normal  Results for orders placed or performed during the hospital encounter of 05/03/16 (from the past 48 hour(s))  Protime-INR     Status: Abnormal   Collection Time: 05/03/16 12:31 PM  Result Value Ref Range   Prothrombin Time 16.2 (H) 11.6 - 15.2 seconds   INR 1.29 0.00 - 1.49  APTT     Status: None   Collection Time: 05/03/16 12:31 PM  Result Value Ref Range   aPTT 31 24 - 37 seconds  CBC     Status: Abnormal   Collection Time: 05/03/16 12:31 PM  Result Value Ref Range   WBC 5.2 4.0 - 10.5 K/uL   RBC 4.30 3.87 - 5.11 MIL/uL   Hemoglobin 11.7 (L) 12.0 - 15.0 g/dL   HCT 37.1 36.0 - 46.0 %   MCV 86.3 78.0 - 100.0 fL   MCH 27.2 26.0 - 34.0 pg   MCHC 31.5 30.0 - 36.0 g/dL   RDW 17.1 (H) 11.5 - 15.5 %   Platelets 109 (L) 150 - 400 K/uL    Comment: REPEATED TO VERIFY SPECIMEN CHECKED FOR CLOTS PLATELET COUNT CONFIRMED BY SMEAR   Differential     Status: None   Collection Time: 05/03/16 12:31 PM  Result Value Ref Range   Neutrophils Relative % 64 %   Neutro Abs 3.3 1.7 - 7.7 K/uL   Lymphocytes Relative 25 %   Lymphs Abs 1.3 0.7 - 4.0 K/uL   Monocytes Relative 6 %   Monocytes Absolute 0.3 0.1 - 1.0 K/uL   Eosinophils Relative 4 %   Eosinophils Absolute 0.2 0.0 - 0.7 K/uL   Basophils Relative 1 %   Basophils Absolute 0.0 0.0 - 0.1 K/uL  Comprehensive metabolic panel     Status: Abnormal   Collection Time: 05/03/16 12:31 PM  Result Value Ref Range   Sodium 139 135 - 145 mmol/L   Potassium 3.7 3.5 - 5.1 mmol/L   Chloride 106 101 - 111 mmol/L   CO2 27 22 - 32 mmol/L   Glucose, Bld 136 (H) 65 - 99 mg/dL   BUN 23 (H) 6 - 20 mg/dL   Creatinine, Ser 0.89 0.44 - 1.00 mg/dL   Calcium 9.5 8.9 - 10.3 mg/dL   Total Protein 6.6 6.5 - 8.1 g/dL   Albumin 4.0 3.5 - 5.0 g/dL   AST 29 15 - 41 U/L   ALT 22 14 - 54  U/L   Alkaline Phosphatase 70 38 - 126 U/L   Total Bilirubin 0.6 0.3 - 1.2 mg/dL   GFR calc non Af Amer >60 >60 mL/min   GFR calc Af Amer >60 >60 mL/min    Comment: (NOTE) The eGFR has been calculated using the CKD EPI equation. This calculation has not been validated in all clinical situations. eGFR's persistently <60 mL/min signify possible Chronic Kidney Disease.    Anion gap 6  5 - 15  I-stat troponin, ED     Status: None   Collection Time: 05/03/16 12:36 PM  Result Value Ref Range   Troponin i, poc 0.01 0.00 - 0.08 ng/mL   Comment 3            Comment: Due to the release kinetics of cTnI, a negative result within the first hours of the onset of symptoms does not rule out myocardial infarction with certainty. If myocardial infarction is still suspected, repeat the test at appropriate intervals.    Ct Angio Head W/cm &/or Wo Cm  05/03/2016  ADDENDUM REPORT: 05/03/2016 20:40 CONTRAST:  50 mL Isovue 370. Electronically Signed   By: San Morelle M.D.   On: 05/03/2016 20:40  05/03/2016  CLINICAL DATA:  Transient episode of numbness and weakness to the left upper and lower extremity yesterday lasting 5 minutes. Symptoms have since resolved. EXAM: CT ANGIOGRAPHY HEAD AND NECK TECHNIQUE: Multidetector CT imaging of the head and neck was performed using the standard protocol during bolus administration of intravenous contrast. Multiplanar CT image reconstructions and MIPs were obtained to evaluate the vascular anatomy. Carotid stenosis measurements (when applicable) are obtained utilizing NASCET criteria, using the distal internal carotid diameter as the denominator. COMPARISON:  CT head without contrast from the same day. FINDINGS: CTA NECK Aortic arch: A 3 vessel arch configuration is present. Minimal atherosclerotic calcifications are present at the aortic arch without significant stenosis. Right carotid system: The right common carotid artery is mildly tortuous. The bifurcation is  unremarkable. Mild tortuosity is present in the cervical right ICA without significant stenosis. Left carotid system: The left common carotid artery is within normal limits. Calcifications are present at the carotid bifurcation without significant stenosis. Focal tortuosity is present proximally in the cervical left ICA without a significant stenosis. Vertebral arteries:The vertebral arteries originate from the subclavian arteries bilaterally without significant stenosis. There is mild tortuosity proximally. The right vertebral artery is the dominant vessel. There is no focal stenosis of either vertebral artery in the neck. Skeleton: Vertebral body heights and alignment are normal. No focal lytic or blastic lesions are present. ORIF of the mandible is noted. Other neck: No focal mucosal or submucosal lesions are present. Vocal cords are midline and symmetric. The tongue base is unremarkable. Salivary glands are within normal limits. Mildly enlarged right level 2 lymph nodes measure up to 9 mm. No other significant adenopathy is present. CTA HEAD Anterior circulation: Atherosclerotic calcifications are present through the cavernous internal carotid arteries bilaterally without a significant stenosis. The A1 and M1 segments are normal. The anterior communicating artery is patent. MCA bifurcations are intact. Mild diffuse small vessel irregularity is present throughout the anterior circulation. Posterior circulation: PICA origins are visualized and normal bilaterally. The basilar artery is normal. Both posterior cerebral arteries originate from the basilar tip. A left posterior communicating artery contributes. PCA branch vessels are intact bilaterally. Venous sinuses: The dural sinuses are patent. The transverse sinuses are codominant. The straight sinus and deep cerebral veins are intact. The cortical veins are unremarkable. Anatomic variants: None. Delayed phase: The postcontrast images demonstrate no pathologic  enhancement. IMPRESSION: 1. Atherosclerotic changes at the left carotid bifurcation without significant stenosis. 2. Tortuosity of the cervical internal carotid arteries bilaterally, more prominent on the left. There is no associated stenosis. 3. Mildly enlarged right level 2 lymph nodes are nonspecific. No primary lesion is present to suggest this is metastatic disease. Lymphoma is also considered. These are most likely reactive. 4. Atherosclerotic  changes within the cavernous internal carotid arteries bilaterally without significant stenosis. 5. Mild diffuse intracranial small vessel disease likely reflecting atherosclerotic change. No significant proximal stenosis, aneurysm, or branch vessel occlusion within the circle of Willis. Electronically Signed: By: San Morelle M.D. On: 05/03/2016 19:43   Ct Head Wo Contrast  05/03/2016  CLINICAL DATA:  Left-sided numbness yesterday. EXAM: CT HEAD WITHOUT CONTRAST TECHNIQUE: Contiguous axial images were obtained from the base of the skull through the vertex without intravenous contrast. COMPARISON:  March 26, 2013 FINDINGS: Paranasal sinuses, mastoid air cells, and middle ears, bones, and extracranial soft tissues demonstrate no acute abnormalities. Unusual shape of the right globe, unchanged and of no acute significance. No subdural, epidural, or subarachnoid hemorrhage. Ventricles and sulci are normal for age. Minimal white matter changes. No acute cortical ischemia or infarct. No mass, mass effect, or midline shift. The cerebellum, brainstem, and basal cisterns are within normal limits. IMPRESSION: No acute abnormalities Electronically Signed   By: Dorise Bullion III M.D   On: 05/03/2016 13:19   Ct Angio Neck W/cm &/or Wo/cm  05/03/2016  ADDENDUM REPORT: 05/03/2016 20:40 CONTRAST:  50 mL Isovue 370. Electronically Signed   By: San Morelle M.D.   On: 05/03/2016 20:40  05/03/2016  CLINICAL DATA:  Transient episode of numbness and weakness to the  left upper and lower extremity yesterday lasting 5 minutes. Symptoms have since resolved. EXAM: CT ANGIOGRAPHY HEAD AND NECK TECHNIQUE: Multidetector CT imaging of the head and neck was performed using the standard protocol during bolus administration of intravenous contrast. Multiplanar CT image reconstructions and MIPs were obtained to evaluate the vascular anatomy. Carotid stenosis measurements (when applicable) are obtained utilizing NASCET criteria, using the distal internal carotid diameter as the denominator. COMPARISON:  CT head without contrast from the same day. FINDINGS: CTA NECK Aortic arch: A 3 vessel arch configuration is present. Minimal atherosclerotic calcifications are present at the aortic arch without significant stenosis. Right carotid system: The right common carotid artery is mildly tortuous. The bifurcation is unremarkable. Mild tortuosity is present in the cervical right ICA without significant stenosis. Left carotid system: The left common carotid artery is within normal limits. Calcifications are present at the carotid bifurcation without significant stenosis. Focal tortuosity is present proximally in the cervical left ICA without a significant stenosis. Vertebral arteries:The vertebral arteries originate from the subclavian arteries bilaterally without significant stenosis. There is mild tortuosity proximally. The right vertebral artery is the dominant vessel. There is no focal stenosis of either vertebral artery in the neck. Skeleton: Vertebral body heights and alignment are normal. No focal lytic or blastic lesions are present. ORIF of the mandible is noted. Other neck: No focal mucosal or submucosal lesions are present. Vocal cords are midline and symmetric. The tongue base is unremarkable. Salivary glands are within normal limits. Mildly enlarged right level 2 lymph nodes measure up to 9 mm. No other significant adenopathy is present. CTA HEAD Anterior circulation: Atherosclerotic  calcifications are present through the cavernous internal carotid arteries bilaterally without a significant stenosis. The A1 and M1 segments are normal. The anterior communicating artery is patent. MCA bifurcations are intact. Mild diffuse small vessel irregularity is present throughout the anterior circulation. Posterior circulation: PICA origins are visualized and normal bilaterally. The basilar artery is normal. Both posterior cerebral arteries originate from the basilar tip. A left posterior communicating artery contributes. PCA branch vessels are intact bilaterally. Venous sinuses: The dural sinuses are patent. The transverse sinuses are codominant. The straight sinus  and deep cerebral veins are intact. The cortical veins are unremarkable. Anatomic variants: None. Delayed phase: The postcontrast images demonstrate no pathologic enhancement. IMPRESSION: 1. Atherosclerotic changes at the left carotid bifurcation without significant stenosis. 2. Tortuosity of the cervical internal carotid arteries bilaterally, more prominent on the left. There is no associated stenosis. 3. Mildly enlarged right level 2 lymph nodes are nonspecific. No primary lesion is present to suggest this is metastatic disease. Lymphoma is also considered. These are most likely reactive. 4. Atherosclerotic changes within the cavernous internal carotid arteries bilaterally without significant stenosis. 5. Mild diffuse intracranial small vessel disease likely reflecting atherosclerotic change. No significant proximal stenosis, aneurysm, or branch vessel occlusion within the circle of Willis. Electronically Signed: By: San Morelle M.D. On: 05/03/2016 19:43    Assessment: 75 y.o. female with multiple risk factors for stroke presenting with probable TIA on 05/02/2016. Small subcortical ischemic infarction cannot be ruled out at this point area, and unfortunately, patient could not undergo MRI study because she has an implanted cardiac  pacemaker.  Stroke Risk Factors - atrial fibrillation, hyperlipidemia and mitral valve disease  Plan: 1. HgbA1c, fasting lipid panel 2. PT consult, OT consult 3. Echocardiogram 4. Prophylactic therapy-anticoagulation with Eliquis 5. Risk factor modification 6. Telemetry monitoring  C.R. Nicole Kindred, MD [facility] [phone]  05/03/2016, 8:43 PM

## 2016-05-03 NOTE — Progress Notes (Signed)
Patient ID: [name] [name], female   DOB: 1940/12/10, 75 y.o.   MRN: [id number] HPI: [name] [name] is a 75 yo female with PMH of A. Fib s/p MAZE, acute on chronic heart failure status post minimally invasive mitral valve replacement, lung nodule being followed serially who is seen in consultation at the request of Dr. Derrel Nip to evaluate multiple hepatic cysts seen on imaging of the abdomen and pelvis prior to valve replacement. She reports that she has been very worried about this appointment and has researched multiple liver cysts online. She is a retired English as a second language teacher. She denies current abdominal complaints. No abdominal pain. No nausea or vomiting. She reports bowel habits are normal without blood in her stool or melena. She denies a personal history of liver disease. No jaundice, itching, ascites, lower extremity swelling, bleeding. She does not drink alcohol. No tobacco or illicit drug use.  She reports that she did work as a English as a second language teacher with multiple different elements and compounds. For about 5 or 10 years early in her career she worked and what she considered a "unsafe" environment without appropriate ventilation. She does specifically remember working with asbestos.  She had a maternal grandmother who had liver cancer. Unclear if this was metastatic or not. This was in the 1960s.  She yesterday, while driving in [place], had complete weakness of her left arm and left leg 5 minutes. This resolved and she was able to drive back home. She has remained on Eliquis under the direction of Dr. Acie Fredrickson for her atrial fibrillation. She currently denies weakness, numbness, tingling. No headache.  Past Medical History  Diagnosis Date  . SVT (supraventricular tachycardia) ([place])   . MVP (mitral valve prolapse)   . Hyperlipidemia   . Idiopathic thrombocytopenic purpura (ITP)   . Hemorrhoid   . IBS (irritable bowel syndrome)   . Migraine   . Severe mitral regurgitation   . RBBB   . Anxiety  associated with depression     Prn alprazolam   . Thoracic aorta atherosclerosis ([place])   . Nodule of right lung   . Restrictive lung disease     Mild on PFT & likely cardiac in etiology   . 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 Atrial fibrillation, persistent ([place])     DCCV 08/22/2015  . Acute on chronic diastolic (congestive) heart failure ([place])   . 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 S/P Minimally invasive maze operation for atrial fibrillation 10/31/2015    Complete bilateral atrial lesion set using cryothermy and bipolar radiofrequency ablation with clipping of LA appendage via right mini thoracotomy approach  . Bradycardia post-op bradycardia, pacer dependent    MDT PPM 11/06/15, Dr. Lovena Le  . Diverticulosis   . Hepatic cyst     innumerable    Past Surgical History  Procedure Laterality Date  . Tubal ligation    . Foot surgery    . Varicose vein surgery Right   . Mandible fracture surgery  03-26-13  . Colonoscopy  2003  . Eye surgery Right 2013  . Breast biopsy    . Cardioversion N/A 08/22/2015    Procedure: CARDIOVERSION;  Surgeon: Thayer Headings, MD;  Location: [facility] ENDOSCOPY;  Service: Cardiovascular;  Laterality: N/A;  . Tee without cardioversion N/A 08/22/2015    Procedure: TRANSESOPHAGEAL ECHOCARDIOGRAM (TEE);  Surgeon: Thayer Headings, MD;  Location: [facility]  ENDOSCOPY;  Service: Cardiovascular;  Laterality: N/A;  . Tee with cardioversion    . Cardiac catheterization N/A 10/18/2015    Procedure: Right/Left Heart Cath and Coronary Angiography;  Surgeon: Sherren Mocha, MD;  Location: [place] CV LAB;  Service: Cardiovascular;  Laterality: N/A;  . Minimally invasive maze procedure N/A 10/31/2015    Procedure: MINIMALLY INVASIVE MAZE PROCEDURE;  Surgeon: Rexene Alberts, MD;  Location: [place];  Service: Open Heart Surgery;   Laterality: N/A;  . Tee without cardioversion N/A 10/31/2015    Procedure: TRANSESOPHAGEAL ECHOCARDIOGRAM (TEE);  Surgeon: Rexene Alberts, MD;  Location: [place];  Service: Open Heart Surgery;  Laterality: N/A;  . Mitral valve replacement Right 10/31/2015    Procedure: MINIMALLY INVASIVE MITRAL VALVE (MV) REPLACEMENT;  Surgeon: Rexene Alberts, MD;  Location: [place];  Service: Open Heart Surgery;  Laterality: Right;  . Ep implantable device N/A 11/06/2015    Procedure: Pacemaker Implant;  Surgeon: Evans Lance, MD;  Location: [place] CV LAB;  Service: Cardiovascular;  Laterality: N/A;  . Pacemaker lead removal  02/20/2016  . Ep implantable device N/A 02/20/2016    Procedure: Lead Extraction;  Surgeon: Evans Lance, MD;  Location: [place] CV LAB;  Service: Cardiovascular;  Laterality: N/A;    Outpatient Prescriptions Prior to Visit  Medication Sig Dispense Refill  . albuterol (PROVENTIL HFA;VENTOLIN HFA) 108 (90 BASE) MCG/ACT inhaler Inhale 2 puffs into the lungs every 6 (six) hours as needed for wheezing or shortness of breath. 1 Inhaler 2  . ALPRAZolam (XANAX) 0.25 MG tablet Take 1 tablet (0.25 mg total) by mouth at bedtime as needed for anxiety or sleep. 30 tablet 5  . amoxicillin (AMOXIL) 500 MG tablet Take 4 tablets (2,000 mg total) by mouth once. Take 4 tablets 60 minutes prior to dental work 30 tablet 1  . apixaban (ELIQUIS) 5 MG TABS tablet Take 1 tablet (5 mg total) by mouth 2 (two) times daily. 180 tablet 3  . azelastine (ASTELIN) 0.1 % nasal spray Place 1 spray into both nostrils as needed for allergies.   5  . b complex vitamins tablet Take 1 tablet by mouth daily.    . butalbital-acetaminophen-caffeine (FIORICET, ESGIC) 50-325-40 MG tablet TAKE 1 TABLET BY MOUTH AS NEEDED FOR BACK PAIN OR HEADACHE 90 tablet 2  . Cholecalciferol (VITAMIN D) 2000 units CAPS Take 2,000 Units by mouth daily.    . Coenzyme Q10 (CO Q 10) 100 MG CAPS Take 100 mg by mouth daily.    . furosemide  (LASIX) 40 MG tablet Take 1 tablet (40 mg total) by mouth daily as needed for fluid. 30 tablet 11  . metoprolol tartrate (LOPRESSOR) 25 MG tablet Take 0.5 tablets (12.5 mg total) by mouth 2 (two) times daily. 90 tablet 3  . montelukast (SINGULAIR) 10 MG tablet Take 10 mg by mouth at bedtime.     . rosuvastatin (CRESTOR) 10 MG tablet Take 1 tablet (10 mg total) by mouth daily. 30 tablet 2  . traMADol (ULTRAM) 50 MG tablet Take 1-2 tablets (50-100 mg total) by mouth every 4 (four) hours as needed for moderate pain. 30 tablet 0   No facility-administered medications prior to visit.    Allergies  Allergen Reactions  . Meloxicam Other (See Comments)    Severe reflux  . Codeine Nausea And Vomiting    migraine  . Doxycycline Itching and Swelling    Facial  . Hydrocodone Nausea And Vomiting    MIGRAINE  .  Hydromorphone Nausea And Vomiting    migraine  . Oxycodone Nausea And Vomiting    Severe migraine    Family History  Problem Relation Age of Onset  . Hypertension Mother   . Arrhythmia Mother   . Heart failure Mother   . Arrhythmia Brother   . Stroke Brother 51    cerebral hemorrhage, nonsmoker, no HTN  . Prostate cancer Brother   . Stroke Father     from an aneurysm  . Stroke Maternal Aunt 83    cerebral hemorrhage  . Liver cancer Maternal Grandmother   . Atrial fibrillation Son   . Celiac disease Son   . Heart attack Neg Hx     Social History  Substance Use Topics  . Smoking status: Never Smoker   . Smokeless tobacco: Never Used  . Alcohol Use: No    ROS: As per history of present illness, otherwise negative  BP 102/70 mmHg  Pulse 79  Ht 5\' 5"  (1.651 m)  Wt 139 lb 12.8 oz (63.413 kg)  BMI 23.26 kg/m2  LMP  (Exact Date) Constitutional: Well-developed and well-nourished. No distress. HEENT: Normocephalic and atraumatic. Oropharynx is clear and moist. No oropharyngeal exudate. Conjunctivae are normal.  No scleral icterus. Neck: Neck supple. Trachea  midline. Cardiovascular: Normal rate, regular rhythm and intact distal pulses. No M/R/G Pulmonary/chest: Effort normal and breath sounds normal. No wheezing, rales or rhonchi. Abdominal: Soft, nontender, nondistended. Bowel sounds active throughout. There are no masses palpable. No hepatosplenomegaly. Extremities: no clubbing, cyanosis, or edema Lymphadenopathy: No cervical adenopathy noted. Neurological: Alert and oriented to person place and time. No facial asymmetry, normal strength in bilateral upper and lower extremity currently. Skin: Skin is warm and dry. No rashes noted. Psychiatric: Normal mood and affect. Behavior is normal.  RELEVANT LABS AND IMAGING: CBC    Component Value Date/Time   WBC 5.2 05/03/2016 1231   WBC 4.6 11/22/2013 0940   RBC 4.30 05/03/2016 1231   RBC 4.34 11/22/2013 0940   HGB 11.7* 05/03/2016 1231   HGB 12.9 11/22/2013 0940   HCT 37.1 05/03/2016 1231   HCT 39.6 11/22/2013 0940   PLT PENDING 05/03/2016 1231   PLT 122* 11/22/2013 0940   MCV 86.3 05/03/2016 1231   MCV 91 11/22/2013 0940   MCH 27.2 05/03/2016 1231   MCH 29.8 11/22/2013 0940   MCHC 31.5 05/03/2016 1231   MCHC 32.7 11/22/2013 0940   RDW 17.1* 05/03/2016 1231   RDW 13.7 11/22/2013 0940   LYMPHSABS 1.3 05/03/2016 1231   LYMPHSABS 1.7 11/22/2013 0940   MONOABS 0.3 05/03/2016 1231   MONOABS 0.3 11/22/2013 0940   EOSABS 0.2 05/03/2016 1231   EOSABS 0.1 11/22/2013 0940   BASOSABS 0.0 05/03/2016 1231   BASOSABS 0.0 11/22/2013 0940    CMP     Component Value Date/Time   NA 139 05/03/2016 1231   NA 143 12/05/2014 0848   K 3.7 05/03/2016 1231   CL 106 05/03/2016 1231   CO2 27 05/03/2016 1231   GLUCOSE 136* 05/03/2016 1231   GLUCOSE 94 12/05/2014 0848   BUN 23* 05/03/2016 1231   BUN 19 12/05/2014 0848   CREATININE 0.89 05/03/2016 1231   CREATININE 0.88 02/08/2016 1053   CALCIUM 9.5 05/03/2016 1231   PROT 6.6 05/03/2016 1231   PROT 5.8* 12/05/2014 0848   ALBUMIN 4.0 05/03/2016  1231   ALBUMIN 4.0 12/05/2014 0848   AST 29 05/03/2016 1231   ALT 22 05/03/2016 1231   ALKPHOS 70 05/03/2016 1231  BILITOT 0.6 05/03/2016 1231   GFRNONAA >60 05/03/2016 1231   GFRAA >60 05/03/2016 1231   CLINICAL DATA:  mitral valve surgery next Tuesday hx -? Aortic aneurysm aortoilical occlussive dz Hx of HTN   EXAM: CT ANGIOGRAPHY CHEST, ABDOMEN AND PELVIS   TECHNIQUE: Multidetector CT imaging through the chest, abdomen and pelvis was performed using the standard protocol during bolus administration of intravenous contrast. Multiplanar reconstructed images and MIPs were obtained and reviewed to evaluate the vascular anatomy.   CONTRAST:  170 mL OMNIPAQUE IOHEXOL 350 MG/ML SOLN   COMPARISON:  08/15/2015 and previous   FINDINGS: CHEST   Noncontrast scout images were not obtained. Right arm IV contrast administration. The SVC is patent. Four-chamber cardiac enlargement. Minimal contrast reflux from the right atrium into the IVC. Dilated central pulmonary arteries. Satisfactory opacification of pulmonary arteries noted, and there is no evidence of pulmonary emboli. There is an accessory superior segment right lower lobe pulmonary vein. Mitral leaflet calcifications. Scattered coronary calcifications. Incomplete opacification of the aorta secondary to scan timing, with no evidence of dissection, aneurysm, or stenosis. There is classic 3-vessel brachiocephalic arch anatomy without suggestion of proximal stenosis.   No pleural or pericardial effusion. No hilar or mediastinal adenopathy. Calcified subpleural granuloma in the anterior right upper lobe. 6 mm subpleural nodule, lateral basal segment right lower lobe image 30/10, stable since previous. 5 mm pleural-based nodule, anterior basal segment right lower lobe image 18. Linear scarring or subsegmental atelectasis medially in the right middle lobe. Lungs otherwise clear. Mild thoracic levoscoliosis, with minimal spurring  in the lower thoracic spine. Sternum intact.   Review of the MIP images confirms the above findings.   ABDOMEN   Arterial findings:   Aorta: Incomplete opacification secondary to scan timing. Scattered atheromatous calcifications. Mild tortuosity. No aneurysm, dissection, or stenosis.   Celiac axis: Nonocclusive partially calcified ostial plaque, patent distally   Superior mesenteric: Nonocclusive partially calcified ostial plaque, patent distally   Left renal:          Single, patent   Right renal: Single, with partially calcified ostial plaque, no convincing high-grade stenosis, patent distally.   Inferior mesenteric: Patent   Left iliac: Scattered calcified nonocclusive plaque through the common iliac and in the proximal internal iliac. External iliac is mildly ectatic, patent. Eccentric nonocclusive plaque in the common femoral artery noted.   Right iliac: Nonocclusive scattered calcified plaque through the common iliac, in the proximal internal iliac, and in the common femoral artery. External iliac is widely patent.   Venous findings: Patent hepatic veins, portal vein, SMV, splenic vein, bilateral renal veins, IVC, and iliac venous system.   Review of the MIP images confirms the above findings.   Nonvascular findings: Innumerable hepatic cysts versus peliosis, stable. Unremarkable spleen, pancreas. Bilateral renal cysts, largest parapelvic 2.3 cm lower pole on the left, on the right 19 mm midpole. No hydronephrosis or solid renal lesion. Stomach , small bowel, colon nondilated. Normal appendix. Urinary bladder incompletely distended. Uterus and adnexal regions grossly unremarkable. No ascites. No free air. No adenopathy. Lumbar dextroscoliosis apex L1-2 with multilevel spondylitic changes and facet DJD.   IMPRESSION: 1. Dilated central pulmonary arteries suggesting pulmonary hypertension. 2. Atherosclerosis, including aortoiliac and coronary artery disease.  Please note that although the presence of coronary artery calcium documents the presence of coronary artery disease, the severity of this disease and any potential stenosis cannot be assessed on this non-gated CT examination. Assessment for potential risk factor modification, dietary therapy or pharmacologic therapy  may be warranted, if clinically indicated. 3. Negative for acute PE or aortic aneurysm. 4. Little interval change in hepatic   cysts versus peliosis.     Electronically Signed   By: Lucrezia Europe M.D.   On: 10/27/2015 09:48  ASSESSMENT/PLAN: 75 yo female with PMH of A. Fib s/p MAZE, acute on chronic heart failure status post minimally invasive mitral valve replacement, lung nodule being followed serially who is seen in consultation at the request of Dr. Derrel Nip to evaluate multiple hepatic cysts seen on imaging of the abdomen and pelvis prior to valve replacement.   1. 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  There is no evidence of advanced liver disease or decompensated liver disease. She does not have known malignancy. She does have possible exposure to chemicals early in her career but certainly none recently. No prior prolonged corticosteroid use, no history of tamoxifen, or thiopurine. She denies history of cat scratch disease or any known Bartonella infection. No history of malignancy. She is up-to-date with colorectal cancer screening. I recommended dedicated liver imaging with CT scan, hepatic protocol with IV contrast. MRI was considered but unable to be performed due to history of pacemaker placement. Repeat liver enzymes today, INR, ANA, AMA and anti-smooth muscle antibody. We will likely monitor liver enzymes and serially image the liver to ensure stability of these multiple cysts. Should either change then I would recommend liver biopsy.  2. TIA -- left-sided symptoms yesterday consistent with TIA. I contacted neurology and  they recommended the patient be sent to the ER. She was advised to go immediately to the emergency room from my office. She voiced understanding. She did previous to contacted her cardiologist, Dr. Acie Fredrickson.  3. Colorectal cancer screening -- Normal screening colonoscopy except for diverticulosis performed in [place] on 01/05/2014. She will likely not require further surveillance based on age.    [id number] Ether Griffins, Md [address] [place], [place] [address]

## 2016-05-03 NOTE — Patient Instructions (Addendum)
Your physician has requested that you go to the basement for the following lab work before leaving today: CBC, CMP, INR, Hepatitis labs, ANA, AMA, ASMA  Please go to the Emergency Room at [facility] for your very recent TIA symptoms.  Please follow up with Dr Hilarie Fredrickson in 6 months.  You have been scheduled for a CT scan of the abdomen and pelvis at [facility] ([address]---this is in the same building as Press [facility]).   You are scheduled on Friday, 05/10/16 at 1:30 pm. You should arrive 15 minutes prior to your appointment time for registration. Please follow the written instructions below on the day of your exam:  WARNING: IF YOU ARE ALLERGIC TO IODINE/X-RAY DYE, PLEASE NOTIFY RADIOLOGY IMMEDIATELY AT [phone]! YOU WILL BE GIVEN A 13 HOUR PREMEDICATION PREP.  1) Do not eat or drink anything after 9;30 am (4 hours prior to your test) 2) You have been given 1 bottle of oral contrast to drink. The solution may taste better if refrigerated, but do NOT add ice or any other liquid to this solution. Shake well before drinking.    Drink 1 bottle of contrast @ 12:30 pm (1 hour prior to your exam)  You may take any medications as prescribed with a small amount of water except for the following: Metformin, Glucophage, Glucovance, Avandamet, Riomet, Fortamet, Actoplus Met, Janumet, Glumetza or Metaglip. The above medications must be held the day of the exam AND 48 hours after the exam.  The purpose of you drinking the oral contrast is to aid in the visualization of your intestinal tract. The contrast solution may cause some diarrhea. Before your exam is started, you will be given a small amount of fluid to drink. Depending on your individual set of symptoms, you may also receive an intravenous injection of x-ray contrast/dye. Plan on being at [facility] for 30 minutes or longer, depending on the type of exam you are having performed.  This test typically takes  30-45 minutes to complete.  If you have any questions regarding your exam or if you need to reschedule, you may call the CT department at [phone] between the hours of 8:00 am and 5:00 pm, Monday-Friday.  ________________________________________________________________________ If you are age 76 or older, your body mass index should be between 23-30. Your Body mass index is 23.26 kg/(m^2). If this is out of the aforementioned range listed, please consider follow up with your Primary Care Provider.  If you are age 76 or younger, your body mass index should be between 19-25. Your Body mass index is 23.26 kg/(m^2). If this is out of the aformentioned range listed, please consider follow up with your Primary Care Provider.

## 2016-05-03 NOTE — ED Notes (Signed)
Patient ambulated to bathroom with steady gait.

## 2016-05-03 NOTE — H&P (Signed)
History and Physical    [name] [name] [id number] DOB: 05-02-1941 DOA: 05/03/2016  Referring MD/NP/PA:   PCP: [name], MD   Patient coming from:  The patient is coming from home.  At baseline, pt is independent for most of ADL.    Chief Complaint: Left-sided weakness and numbness  HPI: [name] [name] is a 75 y.o. female with medical history significant of hypertension, depression, anxiety, hepatic cyst, s/p of pace maker placement, atrial fibrillation on Eliquis, s/p of MV replacement, ITP, right bundle blockage, IBS, systolic congestive heart failure, diverticulosis, who presents with left-sided weakness and numbness.  Pt states that she had one episode of left-sided weakness and numbness in her arm and leg at about 12:30 PM. The symptoms lasted for about 5 minutes, and resolved spontaneously. She did not have recurrent symptoms later. Patient denies vision change, hearing loss, slurred speech. No chest pain, shortness breath, cough. No fever or chills. Patient denies nausea, vomiting, diarrhea, abdominal pain symptoms of UTI.  ED Course: pt was found to have an INR 1.29, negative troponin, WBC 5.2, temperature normal, no tachycardia, no tachypnea, electrolytes and renal function okay. CT scan of her head showed no acute intracranial abnormality. Mild small vessel ischemic changes were noted. CT angiogram of head and neck showed atherosclerotic changes as well as tortuosity of her vertical internal carotid bilaterally. No large vessel occlusion or significant stenosis was seen.   Review of Systems:   General: no fevers, chills, no changes in body weight, has fatigue HEENT: no blurry vision, hearing changes or sore throat Pulm: no dyspnea, coughing, wheezing CV: no chest pain, no palpitations Abd: no nausea, vomiting, abdominal pain, diarrhea, constipation GU: no dysuria, burning on urination, increased urinary frequency, hematuria  Ext: no leg edema Neuro: had left sided  weakness, numbness. No vision change or hearing loss Skin: no rash MSK: No muscle spasm, no deformity, no limitation of range of movement in spin Heme: No easy bruising.  Travel history: No recent long distant travel.  Allergy:  Allergies  Allergen Reactions  . Meloxicam Other (See Comments)    Severe reflux  . Codeine Nausea And Vomiting    migraine  . Doxycycline Itching and Swelling    Facial  . Hydrocodone Nausea And Vomiting    MIGRAINE  . Hydromorphone Nausea And Vomiting    migraine  . Oxycodone Nausea And Vomiting    Severe migraine    Past Medical History  Diagnosis Date  . SVT (supraventricular tachycardia) ([place])   . MVP (mitral valve prolapse)   . Hyperlipidemia   . Idiopathic thrombocytopenic purpura (ITP)   . Hemorrhoid   . IBS (irritable bowel syndrome)   . Migraine   . Severe mitral regurgitation   . RBBB   . Anxiety associated with depression     Prn alprazolam   . Thoracic aorta atherosclerosis ([place])   . Nodule of right lung   . Restrictive lung disease     Mild on PFT & likely cardiac in etiology   . 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 Atrial fibrillation, persistent ([place])     DCCV 08/22/2015  . Acute on chronic diastolic (congestive) heart failure ([place])   . 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 S/P Minimally invasive maze operation for atrial fibrillation 10/31/2015    Complete bilateral atrial lesion set using cryothermy  and bipolar radiofrequency ablation with clipping of LA appendage via right mini thoracotomy approach  . Bradycardia post-op bradycardia, pacer dependent    MDT PPM 11/06/15, Dr. Lovena Le  . Diverticulosis   . Hepatic cyst     innumerable    Past Surgical History  Procedure Laterality Date  . Tubal ligation    . Foot surgery    . Varicose vein surgery Right   . Mandible  fracture surgery  03-26-13  . Colonoscopy  2003  . Eye surgery Right 2013  . Breast biopsy    . Cardioversion N/A 08/22/2015    Procedure: CARDIOVERSION;  Surgeon: Thayer Headings, MD;  Location: [facility] ENDOSCOPY;  Service: Cardiovascular;  Laterality: N/A;  . Tee without cardioversion N/A 08/22/2015    Procedure: TRANSESOPHAGEAL ECHOCARDIOGRAM (TEE);  Surgeon: Thayer Headings, MD;  Location: [facility] ENDOSCOPY;  Service: Cardiovascular;  Laterality: N/A;  Darden Dates with cardioversion    . Cardiac catheterization N/A 10/18/2015    Procedure: Right/Left Heart Cath and Coronary Angiography;  Surgeon: Sherren Mocha, MD;  Location: [place] CV LAB;  Service: Cardiovascular;  Laterality: N/A;  . Minimally invasive maze procedure N/A 10/31/2015    Procedure: MINIMALLY INVASIVE MAZE PROCEDURE;  Surgeon: Rexene Alberts, MD;  Location: [place];  Service: Open Heart Surgery;  Laterality: N/A;  . Tee without cardioversion N/A 10/31/2015    Procedure: TRANSESOPHAGEAL ECHOCARDIOGRAM (TEE);  Surgeon: Rexene Alberts, MD;  Location: [place];  Service: Open Heart Surgery;  Laterality: N/A;  . Mitral valve replacement Right 10/31/2015    Procedure: MINIMALLY INVASIVE MITRAL VALVE (MV) REPLACEMENT;  Surgeon: Rexene Alberts, MD;  Location: [place];  Service: Open Heart Surgery;  Laterality: Right;  . Ep implantable device N/A 11/06/2015    Procedure: Pacemaker Implant;  Surgeon: Evans Lance, MD;  Location: [place] CV LAB;  Service: Cardiovascular;  Laterality: N/A;  . Pacemaker lead removal  02/20/2016  . Ep implantable device N/A 02/20/2016    Procedure: Lead Extraction;  Surgeon: Evans Lance, MD;  Location: [place] CV LAB;  Service: Cardiovascular;  Laterality: N/A;    Social History:  reports that she has never smoked. She has never used smokeless tobacco. She reports that she does not drink alcohol or use illicit drugs.  Family History:  Family History  Problem Relation Age of Onset  . Hypertension Mother     . Arrhythmia Mother   . Heart failure Mother   . Arrhythmia Brother   . Stroke Brother 96    cerebral hemorrhage, nonsmoker, no HTN  . Prostate cancer Brother   . Stroke Father     from an aneurysm  . Stroke Maternal Aunt 83    cerebral hemorrhage  . Liver cancer Maternal Grandmother   . Atrial fibrillation Son   . Celiac disease Son   . Heart attack Neg Hx      Prior to Admission medications   Medication Sig Start Date End Date Taking? Authorizing Provider  albuterol (PROVENTIL HFA;VENTOLIN HFA) 108 (90 BASE) MCG/ACT inhaler Inhale 2 puffs into the lungs every 6 (six) hours as needed for wheezing or shortness of breath. 08/15/15  Yes Thayer Headings, MD  ALPRAZolam Duanne Moron) 0.25 MG tablet Take 1 tablet (0.25 mg total) by mouth at bedtime as needed for anxiety or sleep. 08/18/15  Yes [name], MD  amoxicillin (AMOXIL) 500 MG tablet Take 4 tablets (2,000 mg total) by mouth once. Take 4 tablets 60 minutes prior to dental  work 11/23/15  Yes Eileen Stanford, PA-C  apixaban (ELIQUIS) 5 MG TABS tablet Take 1 tablet (5 mg total) by mouth 2 (two) times daily. 04/16/16  Yes Thayer Headings, MD  azelastine (ASTELIN) 0.1 % nasal spray Place 1 spray into both nostrils as needed for allergies.  09/05/15  Yes Historical Provider, MD  b complex vitamins tablet Take 1 tablet by mouth daily.   Yes Historical Provider, MD  butalbital-acetaminophen-caffeine (FIORICET, ESGIC) 50-325-40 MG tablet TAKE 1 TABLET BY MOUTH AS NEEDED FOR BACK PAIN OR HEADACHE 02/09/16  Yes [name], MD  Cholecalciferol (VITAMIN D) 2000 units CAPS Take 2,000 Units by mouth daily.   Yes Historical Provider, MD  Coenzyme Q10 (CO Q 10) 100 MG CAPS Take 100 mg by mouth daily.   Yes Historical Provider, MD  furosemide (LASIX) 40 MG tablet Take 1 tablet (40 mg total) by mouth daily as needed for fluid. Patient taking differently: Take 40 mg by mouth daily as needed for fluid (As needed for weight gain <2lb).  04/16/16  Yes  Thayer Headings, MD  metoprolol tartrate (LOPRESSOR) 25 MG tablet Take 0.5 tablets (12.5 mg total) by mouth 2 (two) times daily. 01/12/16  Yes Thayer Headings, MD  montelukast (SINGULAIR) 10 MG tablet Take 10 mg by mouth at bedtime.    Yes Historical Provider, MD  rosuvastatin (CRESTOR) 10 MG tablet Take 1 tablet (10 mg total) by mouth daily. 02/19/16  Yes [name], MD  traMADol (ULTRAM) 50 MG tablet Take 1-2 tablets (50-100 mg total) by mouth every 4 (four) hours as needed for moderate pain. 11/09/15  Yes Coolidge Breeze, PA-C    Physical Exam: Filed Vitals:   05/03/16 2300 05/04/16 0115 05/04/16 0300 05/04/16 0515  BP: 122/75 108/62 105/61 103/56  Pulse: 83 80 68 74  Temp: 97.6 F (36.4 C) 98.1 F (36.7 C) 98.1 F (36.7 C) 97.8 F (36.6 C)  TempSrc: Oral Oral Oral Oral  Resp: 18 16 16 16   Height:      Weight:      SpO2: 97% 98% 100% 97%   General: Not in acute distress HEENT:       Eyes: PERRL, EOMI, no scleral icterus.       ENT: No discharge from the ears and nose, no pharynx injection, no tonsillar enlargement.        Neck: No JVD, no bruit, no mass felt. Heme: No neck lymph node enlargement. Cardiac: S1/S2, RRR, No murmurs, No gallops or rubs. Pulm:  No rales, wheezing, rhonchi or rubs. Abd: Soft, nondistended, nontender, no rebound pain, no organomegaly, BS present. GU: No hematuria Ext: No pitting leg edema bilaterally. 2+DP/PT pulse bilaterally. Musculoskeletal: No joint deformities, No joint redness or warmth, no limitation of ROM in spin. Skin: No rashes.  Neuro: Alert, oriented X3, cranial nerves II-XII grossly intact, moves all extremities normally. Muscle strength 5/5 in all extremities, sensation to light touch intact. Knee reflex 1+ bilaterally. Negative Babinski's sign. Normal finger to nose test. Psych: Patient is not psychotic, no suicidal or hemocidal ideation.  Labs on Admission: I have personally reviewed following labs and imaging  studies  CBC:  Recent Labs Lab 05/03/16 1108 05/03/16 1231  WBC 5.8 5.2  NEUTROABS 3.7 3.3  HGB 12.1 11.7*  HCT 36.4 37.1  MCV 85.4 86.3  PLT 122.0* 0000000*   Basic Metabolic Panel:  Recent Labs Lab 05/03/16 1108 05/03/16 1231  NA 142 139  K 4.3 3.7  CL  105 106  CO2 29 27  GLUCOSE 95 136*  BUN 26* 23*  CREATININE 0.83 0.89  CALCIUM 9.5 9.5   GFR: Estimated Creatinine Clearance: 51.1 mL/min (by C-G formula based on Cr of 0.89). Liver Function Tests:  Recent Labs Lab 05/03/16 1108 05/03/16 1231  AST 25 29  ALT 18 22  ALKPHOS 76 70  BILITOT 0.5 0.6  PROT 7.0 6.6  ALBUMIN 4.5 4.0   No results for input(s): LIPASE, AMYLASE in the last 168 hours. No results for input(s): AMMONIA in the last 168 hours. Coagulation Profile:  Recent Labs Lab 05/03/16 1108 05/03/16 1231  INR 1.3* 1.29   Cardiac Enzymes: No results for input(s): CKTOTAL, CKMB, CKMBINDEX, TROPONINI in the last 168 hours. BNP (last 3 results) No results for input(s): PROBNP in the last 8760 hours. HbA1C: No results for input(s): HGBA1C in the last 72 hours. CBG: No results for input(s): GLUCAP in the last 168 hours. Lipid Profile: No results for input(s): CHOL, HDL, LDLCALC, TRIG, CHOLHDL, LDLDIRECT in the last 72 hours. Thyroid Function Tests: No results for input(s): TSH, T4TOTAL, FREET4, T3FREE, THYROIDAB in the last 72 hours. Anemia Panel: No results for input(s): VITAMINB12, FOLATE, FERRITIN, TIBC, IRON, RETICCTPCT in the last 72 hours. Urine analysis:    Component Value Date/Time   COLORURINE YELLOW 10/27/2015 1005   APPEARANCEUR CLEAR 10/27/2015 1005   LABSPEC <1.005* 10/27/2015 1005   PHURINE 5.0 10/27/2015 1005   GLUCOSEU NEGATIVE 10/27/2015 1005   HGBUR NEGATIVE 10/27/2015 1005   BILIRUBINUR NEGATIVE 10/27/2015 1005   KETONESUR NEGATIVE 10/27/2015 1005   PROTEINUR NEGATIVE 10/27/2015 1005   NITRITE NEGATIVE 10/27/2015 1005   LEUKOCYTESUR NEGATIVE 10/27/2015 1005   Sepsis  Labs: @LABRCNTIP (procalcitonin:4,lacticidven:4) )No results found for this or any previous visit (from the past 240 hour(s)).   Radiological Exams on Admission: Ct Angio Head W/cm &/or Wo Cm  05/03/2016  ADDENDUM REPORT: 05/03/2016 20:40 CONTRAST:  50 mL Isovue 370. Electronically Signed   By: San Morelle M.D.   On: 05/03/2016 20:40  05/03/2016  CLINICAL DATA:  Transient episode of numbness and weakness to the left upper and lower extremity yesterday lasting 5 minutes. Symptoms have since resolved. EXAM: CT ANGIOGRAPHY HEAD AND NECK TECHNIQUE: Multidetector CT imaging of the head and neck was performed using the standard protocol during bolus administration of intravenous contrast. Multiplanar CT image reconstructions and MIPs were obtained to evaluate the vascular anatomy. Carotid stenosis measurements (when applicable) are obtained utilizing NASCET criteria, using the distal internal carotid diameter as the denominator. COMPARISON:  CT head without contrast from the same day. FINDINGS: CTA NECK Aortic arch: A 3 vessel arch configuration is present. Minimal atherosclerotic calcifications are present at the aortic arch without significant stenosis. Right carotid system: The right common carotid artery is mildly tortuous. The bifurcation is unremarkable. Mild tortuosity is present in the cervical right ICA without significant stenosis. Left carotid system: The left common carotid artery is within normal limits. Calcifications are present at the carotid bifurcation without significant stenosis. Focal tortuosity is present proximally in the cervical left ICA without a significant stenosis. Vertebral arteries:The vertebral arteries originate from the subclavian arteries bilaterally without significant stenosis. There is mild tortuosity proximally. The right vertebral artery is the dominant vessel. There is no focal stenosis of either vertebral artery in the neck. Skeleton: Vertebral body heights and  alignment are normal. No focal lytic or blastic lesions are present. ORIF of the mandible is noted. Other neck: No focal mucosal or submucosal lesions  are present. Vocal cords are midline and symmetric. The tongue base is unremarkable. Salivary glands are within normal limits. Mildly enlarged right level 2 lymph nodes measure up to 9 mm. No other significant adenopathy is present. CTA HEAD Anterior circulation: Atherosclerotic calcifications are present through the cavernous internal carotid arteries bilaterally without a significant stenosis. The A1 and M1 segments are normal. The anterior communicating artery is patent. MCA bifurcations are intact. Mild diffuse small vessel irregularity is present throughout the anterior circulation. Posterior circulation: PICA origins are visualized and normal bilaterally. The basilar artery is normal. Both posterior cerebral arteries originate from the basilar tip. A left posterior communicating artery contributes. PCA branch vessels are intact bilaterally. Venous sinuses: The dural sinuses are patent. The transverse sinuses are codominant. The straight sinus and deep cerebral veins are intact. The cortical veins are unremarkable. Anatomic variants: None. Delayed phase: The postcontrast images demonstrate no pathologic enhancement. IMPRESSION: 1. Atherosclerotic changes at the left carotid bifurcation without significant stenosis. 2. Tortuosity of the cervical internal carotid arteries bilaterally, more prominent on the left. There is no associated stenosis. 3. Mildly enlarged right level 2 lymph nodes are nonspecific. No primary lesion is present to suggest this is metastatic disease. Lymphoma is also considered. These are most likely reactive. 4. Atherosclerotic changes within the cavernous internal carotid arteries bilaterally without significant stenosis. 5. Mild diffuse intracranial small vessel disease likely reflecting atherosclerotic change. No significant proximal  stenosis, aneurysm, or branch vessel occlusion within the circle of Willis. Electronically Signed: By: San Morelle M.D. On: 05/03/2016 19:43   Ct Head Wo Contrast  05/03/2016  CLINICAL DATA:  Left-sided numbness yesterday. EXAM: CT HEAD WITHOUT CONTRAST TECHNIQUE: Contiguous axial images were obtained from the base of the skull through the vertex without intravenous contrast. COMPARISON:  March 26, 2013 FINDINGS: Paranasal sinuses, mastoid air cells, and middle ears, bones, and extracranial soft tissues demonstrate no acute abnormalities. Unusual shape of the right globe, unchanged and of no acute significance. No subdural, epidural, or subarachnoid hemorrhage. Ventricles and sulci are normal for age. Minimal white matter changes. No acute cortical ischemia or infarct. No mass, mass effect, or midline shift. The cerebellum, brainstem, and basal cisterns are within normal limits. IMPRESSION: No acute abnormalities Electronically Signed   By: Dorise Bullion III M.D   On: 05/03/2016 13:19   Ct Angio Neck W/cm &/or Wo/cm  05/03/2016  ADDENDUM REPORT: 05/03/2016 20:40 CONTRAST:  50 mL Isovue 370. Electronically Signed   By: San Morelle M.D.   On: 05/03/2016 20:40  05/03/2016  CLINICAL DATA:  Transient episode of numbness and weakness to the left upper and lower extremity yesterday lasting 5 minutes. Symptoms have since resolved. EXAM: CT ANGIOGRAPHY HEAD AND NECK TECHNIQUE: Multidetector CT imaging of the head and neck was performed using the standard protocol during bolus administration of intravenous contrast. Multiplanar CT image reconstructions and MIPs were obtained to evaluate the vascular anatomy. Carotid stenosis measurements (when applicable) are obtained utilizing NASCET criteria, using the distal internal carotid diameter as the denominator. COMPARISON:  CT head without contrast from the same day. FINDINGS: CTA NECK Aortic arch: A 3 vessel arch configuration is present. Minimal  atherosclerotic calcifications are present at the aortic arch without significant stenosis. Right carotid system: The right common carotid artery is mildly tortuous. The bifurcation is unremarkable. Mild tortuosity is present in the cervical right ICA without significant stenosis. Left carotid system: The left common carotid artery is within normal limits. Calcifications are present at the carotid  bifurcation without significant stenosis. Focal tortuosity is present proximally in the cervical left ICA without a significant stenosis. Vertebral arteries:The vertebral arteries originate from the subclavian arteries bilaterally without significant stenosis. There is mild tortuosity proximally. The right vertebral artery is the dominant vessel. There is no focal stenosis of either vertebral artery in the neck. Skeleton: Vertebral body heights and alignment are normal. No focal lytic or blastic lesions are present. ORIF of the mandible is noted. Other neck: No focal mucosal or submucosal lesions are present. Vocal cords are midline and symmetric. The tongue base is unremarkable. Salivary glands are within normal limits. Mildly enlarged right level 2 lymph nodes measure up to 9 mm. No other significant adenopathy is present. CTA HEAD Anterior circulation: Atherosclerotic calcifications are present through the cavernous internal carotid arteries bilaterally without a significant stenosis. The A1 and M1 segments are normal. The anterior communicating artery is patent. MCA bifurcations are intact. Mild diffuse small vessel irregularity is present throughout the anterior circulation. Posterior circulation: PICA origins are visualized and normal bilaterally. The basilar artery is normal. Both posterior cerebral arteries originate from the basilar tip. A left posterior communicating artery contributes. PCA branch vessels are intact bilaterally. Venous sinuses: The dural sinuses are patent. The transverse sinuses are codominant.  The straight sinus and deep cerebral veins are intact. The cortical veins are unremarkable. Anatomic variants: None. Delayed phase: The postcontrast images demonstrate no pathologic enhancement. IMPRESSION: 1. Atherosclerotic changes at the left carotid bifurcation without significant stenosis. 2. Tortuosity of the cervical internal carotid arteries bilaterally, more prominent on the left. There is no associated stenosis. 3. Mildly enlarged right level 2 lymph nodes are nonspecific. No primary lesion is present to suggest this is metastatic disease. Lymphoma is also considered. These are most likely reactive. 4. Atherosclerotic changes within the cavernous internal carotid arteries bilaterally without significant stenosis. 5. Mild diffuse intracranial small vessel disease likely reflecting atherosclerotic change. No significant proximal stenosis, aneurysm, or branch vessel occlusion within the circle of Willis. Electronically Signed: By: San Morelle M.D. On: 05/03/2016 19:43     EKG: Independently reviewed. Paced rhythm, QTC 555  Assessment/Plan Principal Problem:   TIA (transient ischemic attack) Active Problems:   Hyperlipidemia   Anxiety associated with depression   Atrial fibrillation, persistent (HCC)   Pacemaker   Chronic systolic congestive heart failure (HCC)   Hepatic cyst   TIA (transient ischemic attack): neurology was consulted, Dr. Nicole Kindred saw patient, recommended TIA workup. Patient has pacemaker, cannot do MRI.  -will place pt on tele bed for obs. -Appreciate Dr. Les Pou consultation, with follow-up presentation is as follows:  1. HgbA1c, fasting lipid panel  2. PT consult, OT consult  3. Echocardiogram  4. Prophylactic therapy-anticoagulation with Eliquis  5. Risk factor modification  6. Telemetry monitoring  HLD: Last LDL was 126 on 02/15/16 -Continue home medications: Lipitor -Check FLP  Anxiety: Stable, no suicidal or homicidal ideations. -Continue  home medications: Xanax  Atrial Fibrillation: CHA2DS2-VASc Score is 4, needs oral anticoagulation. Patient is on Eliquis at home. INR is 1.29 on admission. Heart rate is well controlled. -Continue metoprolol and Eliquis  Chronic systolic congestive heart failure ([place]): 2-D echo/3/17 showed EF 35-40 percent. Patient is on when necessary Lasix. Last dose was a month ago. Patient does not have leg edema or JVD. CHF is compensated. -Hold Lasix -Continue metoprolol -Check BNP  Hepatic cysts: LFT normal. Pt was seen by GI, Dr. Hilarie Fredrickson on 05/03/16. Currently is being worked by Dr. Hilarie Fredrickson, including CT scan-  hepatic protocol with IV contrast, ANA, AMA and anti-smooth muscle antibody.  -f/u with Dr. Hilarie Fredrickson  DVT ppx: on Eliquis Code Status: Full code Family Communication: None at bed side.   Disposition Plan:  Anticipate discharge back to previous home environment Consults called:  Neuro,drNicole Kindred Admission status: Obs / tele   Date of Service 05/04/2016    Ivor Costa [facility] Pager [phone]  If 7PM-7AM, please contact night-coverage [email or web address] Password [email or web address] 05/04/2016, 6:08 AM

## 2016-05-03 NOTE — Progress Notes (Signed)
Patient arrived to 5M20 at 2055. Patient alert and oriented X4 with no c/o pain. Vital signs taken and charted. Telemetry box 20  applied and CCMD notified. Oriented to room with bed alarm on .

## 2016-05-03 NOTE — ED Notes (Signed)
Pt here with one episode of complete numbness and near paralysis to left arm and leg yesterday lasting less than 5 minutes. Pt sts she was recently changed from coumadin to eliquis. Hx of blood clots "a long time ago." No neuro deficits noted at this time. Pt a/o x 4, grips equal, ambulatory.

## 2016-05-03 NOTE — Telephone Encounter (Signed)
New Message  Pt called states that she is concerned that she might have had a TIA when she was out of town. Pt states that since she is so close to the office she was wondering if she could come over for a blood check to see if her eliquis is making her blood thin enough. She states that she maybe finished with the appt by 10a. Please call back to discuss.

## 2016-05-03 NOTE — Progress Notes (Signed)

## 2016-05-03 NOTE — Telephone Encounter (Signed)
New message   Pt calling to notify RN of her not coming to lab appt on 6/2. Pt stated Dr. Raquel James took 8 vows of blood during her appt,  recommended her to a nuerologist for her possible TIA. Pt also stated that Dr. Raquel James was sending her to the ER to run test for possible TIA

## 2016-05-04 DIAGNOSIS — K7689 Other specified diseases of liver: Secondary | ICD-10-CM | POA: Diagnosis present

## 2016-05-04 DIAGNOSIS — E785 Hyperlipidemia, unspecified: Secondary | ICD-10-CM

## 2016-05-04 DIAGNOSIS — I482 Chronic atrial fibrillation, unspecified: Secondary | ICD-10-CM | POA: Insufficient documentation

## 2016-05-04 DIAGNOSIS — G451 Carotid artery syndrome (hemispheric): Secondary | ICD-10-CM

## 2016-05-04 DIAGNOSIS — F418 Other specified anxiety disorders: Secondary | ICD-10-CM | POA: Diagnosis not present

## 2016-05-04 DIAGNOSIS — G459 Transient cerebral ischemic attack, unspecified: Secondary | ICD-10-CM | POA: Diagnosis not present

## 2016-05-04 LAB — LIPID PANEL
Cholesterol: 154 mg/dL (ref 0–200)
HDL: 60 mg/dL (ref >40)
LDL Cholesterol: 81 mg/dL (ref 0–99)
Total CHOL/HDL Ratio: 2.6 RATIO
Triglycerides: 63 mg/dL (ref <150)
VLDL: 13 mg/dL (ref 0–40)

## 2016-05-04 LAB — BRAIN NATRIURETIC PEPTIDE: B Natriuretic Peptide: 686 pg/mL — ABNORMAL HIGH (ref 0.0–100.0)

## 2016-05-04 MED ORDER — ROSUVASTATIN CALCIUM 10 MG PO TABS: 20.0000 mg | ORAL_TABLET | Freq: Every day | ORAL | Status: DC

## 2016-05-04 MED ORDER — ROSUVASTATIN CALCIUM 5 MG PO TABS: 20.0000 mg | ORAL_TABLET | Freq: Every day | ORAL | Status: DC

## 2016-05-04 NOTE — Progress Notes (Signed)
Speech Language Pathology Treatment:    Patient Details Name: [name] [name] MRN: [id number] DOB: [birth year] Today's Date: 05/04/2016 Time: 0959-      Cognitive-language-speech abilities screened and found to be WFL's. No ST needs for further assessment.      Orbie Pyo [place] [phone]

## 2016-05-04 NOTE — Progress Notes (Signed)
OT Cancellation Note  Patient Details Name: [name] [name] MRN: [id number] DOB: [birth year]   Cancelled Treatment:    Reason Eval/Treat Not Completed: Other (comment) Per ST, Dr. Dyann Kief has discontinued PT/OT orders due to pt returning to baseline. [place], OTR/L  [phone] 05/04/2016 05/04/2016, 11:03 AM

## 2016-05-04 NOTE — Progress Notes (Signed)
Patient ready for discharge to home; discharge instructions given and reviewed;Rx sent electronically. Patient discharged out with volunteer; taxi transport to home.

## 2016-05-04 NOTE — Evaluation (Signed)
Physical Therapy Evaluation Patient Details Name: [name] [name] MRN: [id number] DOB: 1941-06-29 Today's Date: 05/04/2016   History of Present Illness  75 y.o. 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 In terms lasted about 5 minutes then zone without intervention. There's been no recurrence of deficits. She has been on anticoagulation with Eliquis. CT scan of her head showed no acute intracranial abnormality. Mild small vessel ischemic changes were noted. CT angiogram of head and neck showed atherosclerotic changes as well as tortuosity of her vertical internal carotid bilaterally. No large vessel occlusion or significant stenosis was seen. NIH stroke score at the time of this evaluation was 0.. Nursing requested PT quickly assess pt's mobility to ensure safety with return to home.  Clinical Impression  Pt with mild left side imbalance, which she attributes to prior foot surgery.  Mild decr in gait velocity to normal parameters.  Pt near baseline performance for mobility.  Feel safe to return home alone.  No need for follow up for therapy.    Follow Up Recommendations No PT follow up    Equipment Recommendations  None recommended by PT    Recommendations for Other Services       Precautions / Restrictions Precautions Precautions: None Restrictions Weight Bearing Restrictions: No      Mobility  Bed Mobility Overal bed mobility: Independent                Transfers Overall transfer level: Independent Equipment used: None             General transfer comment: sit to stand  Ambulation/Gait Ambulation/Gait assistance: Independent Ambulation Distance (Feet): 400 Feet Assistive device: None Gait Pattern/deviations: WFL(Within Functional  Limits) Gait velocity: 10' in 3.82 sec = 2.62 ft/sec Gait velocity interpretation: >2.62 ft/sec, indicative of independent Tourist information centre manager Rankin (Stroke Patients Only) Modified Rankin (Stroke Patients Only) Pre-Morbid Rankin Score: No symptoms Modified Rankin: No symptoms     Balance Overall balance assessment: Modified Independent                                           Pertinent Vitals/Pain Pain Assessment: No/denies pain    Home Living Family/patient expects to be discharged to:: Private residence Living Arrangements: Alone Available Help at Discharge: Available PRN/intermittently;Family (daugher lives in [place]) Type of Home: House Home Access: Level entry     Home Layout: One [place]: Environmental consultant - 2 wheels      Prior Function Level of Independence: Independent         Comments: does yoga regularly     Hand Dominance        Extremity/Trunk Assessment   Upper Extremity Assessment: Overall WFL for tasks assessed           Lower Extremity Assessment: Overall WFL for tasks assessed      Cervical / Trunk Assessment: Kyphotic  Communication   Communication: No difficulties  Cognition Arousal/Alertness: Awake/alert Behavior During Therapy: WFL for tasks assessed/performed Overall Cognitive Status: Within Functional Limits for tasks assessed  General Comments General comments (skin integrity, edema, etc.): single limb stance with 1 UE support.  Tandem Rt foot posterior 30 sec, Lt foot posterior 7.33 sec    Exercises        Assessment/Plan    PT Assessment Patent does not need any further PT services  PT Diagnosis Generalized weakness   PT Problem List    PT Treatment Interventions     PT Goals (Current goals can be found in the Care Plan section) Acute Rehab PT Goals Patient Stated Goal: return home today     Frequency     Barriers to discharge        Co-evaluation               End of Session   Activity Tolerance: Patient tolerated treatment well Patient left: in bed;with call bell/phone within reach Nurse Communication: Mobility status    Functional Assessment Tool Used: gait velocity, balance Functional Limitation: Mobility: Walking and moving around Mobility: Walking and Moving Around Current Status [id number]): At least 1 percent but less than 20 percent impaired, limited or restricted Mobility: Walking and Moving Around Goal Status [phone]): At least 1 percent but less than 20 percent impaired, limited or restricted Mobility: Walking and Moving Around Discharge Status [phone]): At least 1 percent but less than 20 percent impaired, limited or restricted    Time: 1139-1204 PT Time Calculation (min) (ACUTE ONLY): 25 min   Charges:   PT Evaluation $PT Eval Low Complexity: 1 Procedure     PT G Codes:   PT G-Codes **NOT FOR INPATIENT CLASS** Functional Assessment Tool Used: gait velocity, balance Functional Limitation: Mobility: Walking and moving around Mobility: Walking and Moving Around Current Status [id number]): At least 1 percent but less than 20 percent impaired, limited or restricted Mobility: Walking and Moving Around Goal Status [phone]): At least 1 percent but less than 20 percent impaired, limited or restricted Mobility: Walking and Moving Around Discharge Status [phone]): At least 1 percent but less than 20 percent impaired, limited or restricted  Malka So, [place]  [place] 05/04/2016, 12:13 PM

## 2016-05-04 NOTE — Discharge Summary (Signed)
Physician Discharge Summary  [name] [name] [id number] DOB: 05-Jul-1941 DOA: 05/03/2016  PCP: [name], MD  Admit date: 05/03/2016 Discharge date: 05/04/2016  Time spent: 35 minutes  Recommendations for Outpatient Follow-up:  1. Repeat BMET to follow electrolytes and renal function  2. LFT's and lipid panel in 8 weeks; statins adjusted.   Discharge Diagnoses:  Principal Problem:   TIA (transient ischemic attack) Active Problems:   Hyperlipidemia   Anxiety associated with depression   Atrial fibrillation, persistent (HCC)   Pacemaker   Chronic systolic congestive heart failure ([place])   Hepatic cyst   Discharge Condition: stable and improved. Discharge home with instruction to follow up with PCP in 10 days and with Dr. Leonie Man as an outpatient (appointment to be determine by neurology)  Diet recommendation: heart healthy diet   Filed Weights   05/03/16 2056  Weight: 63.1 kg (139 lb 1.8 oz)    History of present illness:  As per H&P written by Dr. Blaine Hamper on 05/03/16 75 y.o. female with medical history significant of hypertension, depression, anxiety, hepatic cyst, s/p of pace maker placement, atrial fibrillation on Eliquis, s/p of MV replacement, ITP, right bundle blockage, IBS, systolic congestive heart failure, diverticulosis, who presents with left-sided weakness and numbness.  Pt states that she had one episode of left-sided weakness and numbness in her arm and leg at about 12:30 PM on Thursday 05/02/16. The symptoms lasted for about 5 minutes, and resolved spontaneously. She did not have recurrent symptoms later. Patient denies vision change, hearing loss, slurred speech. No chest pain, shortness breath, cough. No fever or chills. Patient denies nausea, vomiting, diarrhea, abdominal pain symptoms of UTI.  She saw her GI specialist on Friday 05/03/16 and mentioned about episode to him; neurology was contacted and recommended ER visit for further evaluation and treatment. Patient  admitted for TIA workup   Hospital Course:  TIA (transient ischemic attack): neurology was consulted, and recommended to check CT angio head and neck; along with A1C and lipid panel. Recent 2-D echo done and not need to be repeated. -following recommendations will continue eliquis for secondary prevention -statins adjusted given the fact LDL not at goal -no acute intracranial abnormalities seen on images studies -will follow up with Dr. Leonie Man as an outpatient.  HLD: Last LDL was 126 on 02/15/16 -Continue home statins -LDL still not at goal -per neurology rec's will increase crestor to 20mg  daily -continue CoQ10  Anxiety: Stable, no suicidal or homicidal ideations. -Continue home medications: PRN Xanax  Atrial Fibrillation: CHA2DS2-VASc Score is 4, needs oral anticoagulation.  -Patient is on Eliquis at home; which will be continued  -Heart rate is well controlled. -Continue metoprolol  Chronic systolic congestive heart failure ([place]): 2-D echo/3/17 showed EF 35-40%. Patient is on PRN Lasix. Patient does not have leg edema or JVD. CHF is compensated. -will continue current medication regimen and follow up with cardiology as an outpatient -no SOB or orthopnea reported -encourage to follow low sodium diet and to check her weight on daily basis   Hepatic cysts: LFT normal. Pt was seen by GI, Dr. Hilarie Fredrickson on 05/03/16. Currently is being worked by Dr. Hilarie Fredrickson, including CT scan- hepatic protocol with IV contrast, ANA, AMA and anti-smooth muscle antibody.  -continue f/u with Dr. Hilarie Fredrickson as an outpatient   Procedures:  See below for x-ray reports   Consultations:  Neurology   Discharge Exam: Filed Vitals:   05/04/16 0652 05/04/16 0921  BP: 105/66 100/54  Pulse: 70 72  Temp: 98  F (36.7 C) 98.3 F (36.8 C)  Resp: 16 16    General: afebrile, in no distress and w/o any focal neurologic deficit. Oriented X3 Cardiovascular: soft SEM, no rubs, no gallops Respiratory: CTA  bilaterally Extremities: left hand with some chronic deformities (affecting her thumb and thenar eminence); good grip bilaterally and no focal deficit.   Discharge Instructions   Discharge Instructions    Ambulatory referral to Neurology    Complete by:  As directed   An appointment is requested in approximately: 2-3 weeks; new patient. TIA     Diet - low sodium heart healthy    Complete by:  As directed      Discharge instructions    Complete by:  As directed   Follow heart healthy diet Maintain adequate hydration Arrange follow up with PCP in 10 days          Current Discharge Medication List    CONTINUE these medications which have CHANGED   Details  rosuvastatin (CRESTOR) 10 MG tablet Take 2 tablets (20 mg total) by mouth daily. Qty: 60 tablet, Refills: 2      CONTINUE these medications which have NOT CHANGED   Details  albuterol (PROVENTIL HFA;VENTOLIN HFA) 108 (90 BASE) MCG/ACT inhaler Inhale 2 puffs into the lungs every 6 (six) hours as needed for wheezing or shortness of breath. Qty: 1 Inhaler, Refills: 2    ALPRAZolam (XANAX) 0.25 MG tablet Take 1 tablet (0.25 mg total) by mouth at bedtime as needed for anxiety or sleep. Qty: 30 tablet, Refills: 5    amoxicillin (AMOXIL) 500 MG tablet Take 4 tablets (2,000 mg total) by mouth once. Take 4 tablets 60 minutes prior to dental work Qty: 30 tablet, Refills: 1    apixaban (ELIQUIS) 5 MG TABS tablet Take 1 tablet (5 mg total) by mouth 2 (two) times daily. Qty: 180 tablet, Refills: 3    azelastine (ASTELIN) 0.1 % nasal spray Place 1 spray into both nostrils as needed for allergies.  Refills: 5    b complex vitamins tablet Take 1 tablet by mouth daily.    butalbital-acetaminophen-caffeine (FIORICET, ESGIC) 50-325-40 MG tablet TAKE 1 TABLET BY MOUTH AS NEEDED FOR BACK PAIN OR HEADACHE Qty: 90 tablet, Refills: 2    Cholecalciferol (VITAMIN D) 2000 units CAPS Take 2,000 Units by mouth daily.    Coenzyme Q10 (CO Q 10)  100 MG CAPS Take 100 mg by mouth daily.    furosemide (LASIX) 40 MG tablet Take 1 tablet (40 mg total) by mouth daily as needed for fluid. Qty: 30 tablet, Refills: 11    metoprolol tartrate (LOPRESSOR) 25 MG tablet Take 0.5 tablets (12.5 mg total) by mouth 2 (two) times daily. Qty: 90 tablet, Refills: 3    montelukast (SINGULAIR) 10 MG tablet Take 10 mg by mouth at bedtime.     traMADol (ULTRAM) 50 MG tablet Take 1-2 tablets (50-100 mg total) by mouth every 4 (four) hours as needed for moderate pain. Qty: 30 tablet, Refills: 0       Allergies  Allergen Reactions  . Meloxicam Other (See Comments)    Severe reflux  . Codeine Nausea And Vomiting    migraine  . Doxycycline Itching and Swelling    Facial  . Hydrocodone Nausea And Vomiting    MIGRAINE  . Hydromorphone Nausea And Vomiting    migraine  . Oxycodone Nausea And Vomiting    Severe migraine   Follow-up Information    Follow up with TULLO, TERESA L,  MD. Schedule an appointment as soon as possible for a visit in 10 days.   Specialty:  Internal Medicine   Contact information:   [address] [place] [address] [phone]       The results of significant diagnostics from this hospitalization (including imaging, microbiology, ancillary and laboratory) are listed below for reference.    Significant Diagnostic Studies: Ct Angio Head W/cm &/or Wo Cm  05/03/2016  ADDENDUM REPORT: 05/03/2016 20:40 CONTRAST:  50 mL Isovue 370. Electronically Signed   By: San Morelle M.D.   On: 05/03/2016 20:40  05/03/2016  CLINICAL DATA:  Transient episode of numbness and weakness to the left upper and lower extremity yesterday lasting 5 minutes. Symptoms have since resolved. EXAM: CT ANGIOGRAPHY HEAD AND NECK TECHNIQUE: Multidetector CT imaging of the head and neck was performed using the standard protocol during bolus administration of intravenous contrast. Multiplanar CT image reconstructions and MIPs were obtained  to evaluate the vascular anatomy. Carotid stenosis measurements (when applicable) are obtained utilizing NASCET criteria, using the distal internal carotid diameter as the denominator. COMPARISON:  CT head without contrast from the same day. FINDINGS: CTA NECK Aortic arch: A 3 vessel arch configuration is present. Minimal atherosclerotic calcifications are present at the aortic arch without significant stenosis. Right carotid system: The right common carotid artery is mildly tortuous. The bifurcation is unremarkable. Mild tortuosity is present in the cervical right ICA without significant stenosis. Left carotid system: The left common carotid artery is within normal limits. Calcifications are present at the carotid bifurcation without significant stenosis. Focal tortuosity is present proximally in the cervical left ICA without a significant stenosis. Vertebral arteries:The vertebral arteries originate from the subclavian arteries bilaterally without significant stenosis. There is mild tortuosity proximally. The right vertebral artery is the dominant vessel. There is no focal stenosis of either vertebral artery in the neck. Skeleton: Vertebral body heights and alignment are normal. No focal lytic or blastic lesions are present. ORIF of the mandible is noted. Other neck: No focal mucosal or submucosal lesions are present. Vocal cords are midline and symmetric. The tongue base is unremarkable. Salivary glands are within normal limits. Mildly enlarged right level 2 lymph nodes measure up to 9 mm. No other significant adenopathy is present. CTA HEAD Anterior circulation: Atherosclerotic calcifications are present through the cavernous internal carotid arteries bilaterally without a significant stenosis. The A1 and M1 segments are normal. The anterior communicating artery is patent. MCA bifurcations are intact. Mild diffuse small vessel irregularity is present throughout the anterior circulation. Posterior circulation:  PICA origins are visualized and normal bilaterally. The basilar artery is normal. Both posterior cerebral arteries originate from the basilar tip. A left posterior communicating artery contributes. PCA branch vessels are intact bilaterally. Venous sinuses: The dural sinuses are patent. The transverse sinuses are codominant. The straight sinus and deep cerebral veins are intact. The cortical veins are unremarkable. Anatomic variants: None. Delayed phase: The postcontrast images demonstrate no pathologic enhancement. IMPRESSION: 1. Atherosclerotic changes at the left carotid bifurcation without significant stenosis. 2. Tortuosity of the cervical internal carotid arteries bilaterally, more prominent on the left. There is no associated stenosis. 3. Mildly enlarged right level 2 lymph nodes are nonspecific. No primary lesion is present to suggest this is metastatic disease. Lymphoma is also considered. These are most likely reactive. 4. Atherosclerotic changes within the cavernous internal carotid arteries bilaterally without significant stenosis. 5. Mild diffuse intracranial small vessel disease likely reflecting atherosclerotic change. No significant proximal stenosis, aneurysm,  or branch vessel occlusion within the circle of Willis. Electronically Signed: By: San Morelle M.D. On: 05/03/2016 19:43   Ct Head Wo Contrast  05/03/2016  CLINICAL DATA:  Left-sided numbness yesterday. EXAM: CT HEAD WITHOUT CONTRAST TECHNIQUE: Contiguous axial images were obtained from the base of the skull through the vertex without intravenous contrast. COMPARISON:  March 26, 2013 FINDINGS: Paranasal sinuses, mastoid air cells, and middle ears, bones, and extracranial soft tissues demonstrate no acute abnormalities. Unusual shape of the right globe, unchanged and of no acute significance. No subdural, epidural, or subarachnoid hemorrhage. Ventricles and sulci are normal for age. Minimal white matter changes. No acute cortical  ischemia or infarct. No mass, mass effect, or midline shift. The cerebellum, brainstem, and basal cisterns are within normal limits. IMPRESSION: No acute abnormalities Electronically Signed   By: Dorise Bullion III M.D   On: 05/03/2016 13:19   Ct Angio Neck W/cm &/or Wo/cm  05/03/2016  ADDENDUM REPORT: 05/03/2016 20:40 CONTRAST:  50 mL Isovue 370. Electronically Signed   By: San Morelle M.D.   On: 05/03/2016 20:40  05/03/2016  CLINICAL DATA:  Transient episode of numbness and weakness to the left upper and lower extremity yesterday lasting 5 minutes. Symptoms have since resolved. EXAM: CT ANGIOGRAPHY HEAD AND NECK TECHNIQUE: Multidetector CT imaging of the head and neck was performed using the standard protocol during bolus administration of intravenous contrast. Multiplanar CT image reconstructions and MIPs were obtained to evaluate the vascular anatomy. Carotid stenosis measurements (when applicable) are obtained utilizing NASCET criteria, using the distal internal carotid diameter as the denominator. COMPARISON:  CT head without contrast from the same day. FINDINGS: CTA NECK Aortic arch: A 3 vessel arch configuration is present. Minimal atherosclerotic calcifications are present at the aortic arch without significant stenosis. Right carotid system: The right common carotid artery is mildly tortuous. The bifurcation is unremarkable. Mild tortuosity is present in the cervical right ICA without significant stenosis. Left carotid system: The left common carotid artery is within normal limits. Calcifications are present at the carotid bifurcation without significant stenosis. Focal tortuosity is present proximally in the cervical left ICA without a significant stenosis. Vertebral arteries:The vertebral arteries originate from the subclavian arteries bilaterally without significant stenosis. There is mild tortuosity proximally. The right vertebral artery is the dominant vessel. There is no focal stenosis of  either vertebral artery in the neck. Skeleton: Vertebral body heights and alignment are normal. No focal lytic or blastic lesions are present. ORIF of the mandible is noted. Other neck: No focal mucosal or submucosal lesions are present. Vocal cords are midline and symmetric. The tongue base is unremarkable. Salivary glands are within normal limits. Mildly enlarged right level 2 lymph nodes measure up to 9 mm. No other significant adenopathy is present. CTA HEAD Anterior circulation: Atherosclerotic calcifications are present through the cavernous internal carotid arteries bilaterally without a significant stenosis. The A1 and M1 segments are normal. The anterior communicating artery is patent. MCA bifurcations are intact. Mild diffuse small vessel irregularity is present throughout the anterior circulation. Posterior circulation: PICA origins are visualized and normal bilaterally. The basilar artery is normal. Both posterior cerebral arteries originate from the basilar tip. A left posterior communicating artery contributes. PCA branch vessels are intact bilaterally. Venous sinuses: The dural sinuses are patent. The transverse sinuses are codominant. The straight sinus and deep cerebral veins are intact. The cortical veins are unremarkable. Anatomic variants: None. Delayed phase: The postcontrast images demonstrate no pathologic enhancement. IMPRESSION: 1. Atherosclerotic changes  at the left carotid bifurcation without significant stenosis. 2. Tortuosity of the cervical internal carotid arteries bilaterally, more prominent on the left. There is no associated stenosis. 3. Mildly enlarged right level 2 lymph nodes are nonspecific. No primary lesion is present to suggest this is metastatic disease. Lymphoma is also considered. These are most likely reactive. 4. Atherosclerotic changes within the cavernous internal carotid arteries bilaterally without significant stenosis. 5. Mild diffuse intracranial small vessel  disease likely reflecting atherosclerotic change. No significant proximal stenosis, aneurysm, or branch vessel occlusion within the circle of Willis. Electronically Signed: By: San Morelle M.D. On: 05/03/2016 19:43   Labs: Basic Metabolic Panel:  Recent Labs Lab 05/03/16 1108 05/03/16 1231  NA 142 139  K 4.3 3.7  CL 105 106  CO2 29 27  GLUCOSE 95 136*  BUN 26* 23*  CREATININE 0.83 0.89  CALCIUM 9.5 9.5   Liver Function Tests:  Recent Labs Lab 05/03/16 1108 05/03/16 1231  AST 25 29  ALT 18 22  ALKPHOS 76 70  BILITOT 0.5 0.6  PROT 7.0 6.6  ALBUMIN 4.5 4.0   CBC:  Recent Labs Lab 05/03/16 1108 05/03/16 1231  WBC 5.8 5.2  NEUTROABS 3.7 3.3  HGB 12.1 11.7*  HCT 36.4 37.1  MCV 85.4 86.3  PLT 122.0* 109*   BNP (last 3 results)  Recent Labs  05/04/16 0541  BNP 686.0*    ProBNP (last 3 results) No results for input(s): PROBNP in the last 8760 hours.  CBG: No results for input(s): GLUCAP in the last 168 hours.     Signed:  Barton Dubois MD.  [facility] 05/04/2016, 10:51 AM

## 2016-05-04 NOTE — Progress Notes (Signed)
STROKE TEAM PROGRESS NOTE   HISTORY OF PRESENT ILLNESS (per record) [name] [name] is an 75 y.o. 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 In terms lasted about 5 minutes then zone without intervention. There's been no recurrence of deficits. She has been on anticoagulation with Eliquis. CT scan of her head showed no acute intracranial abnormality. Mild small vessel ischemic changes were noted. CT angiogram of head and neck showed atherosclerotic changes as well as tortuosity of her vertical internal carotid bilaterally. No large vessel occlusion or significant stenosis was seen. NIH stroke score at the time of this evaluation was 0.  LSN: 12:30 PM on 05/02/2016 tPA Given: No: Deficits rapidly resolved mRankin:.   SUBJECTIVE (INTERVAL HISTORY) The patient has been on Coumadin in the past. Recently she has been on Eliquis. Dr. Leonie Man explained that there were no studies to demonstrate greater efficacy among the newer anticoagulants. He recommended continuing the Eliquis since her insurance company has agreed to cover most of the cost.   OBJECTIVE Temp:  [97.6 F (36.4 C)-98.3 F (36.8 C)] 98.3 F (36.8 C) (06/03 0921) Pulse Rate:  [68-83] 72 (06/03 0921) Cardiac Rhythm:  [-] A-V Sequential paced (06/03 0753) Resp:  [14-22] 16 (06/03 0921) BP: (100-125)/(51-78) 100/54 mmHg (06/03 0921) SpO2:  [97 %-100 %] 99 % (06/03 0921) Weight:  [63.1 kg (139 lb 1.8 oz)] 63.1 kg (139 lb 1.8 oz) (06/02 2056)  CBC:   Recent Labs Lab 05/03/16 1108 05/03/16 1231  WBC 5.8 5.2  NEUTROABS 3.7 3.3  HGB 12.1 11.7*  HCT 36.4 37.1  MCV 85.4 86.3  PLT 122.0* 109*    Basic Metabolic Panel:   Recent Labs Lab 05/03/16 1108 05/03/16 1231  NA 142 139  K 4.3 3.7  CL 105 106  CO2  29 27  GLUCOSE 95 136*  BUN 26* 23*  CREATININE 0.83 0.89  CALCIUM 9.5 9.5    Lipid Panel:     Component Value Date/Time   CHOL 154 05/04/2016 0541   CHOL 162 12/05/2014 0848   TRIG 63 05/04/2016 0541   HDL 60 05/04/2016 0541   HDL 61 12/05/2014 0848   CHOLHDL 2.6 05/04/2016 0541   CHOLHDL 2.7 12/05/2014 0848   VLDL 13 05/04/2016 0541   LDLCALC 81 05/04/2016 0541   LDLCALC 85 12/05/2014 0848   HgbA1c:  Lab Results  Component Value Date   HGBA1C 6.0 02/15/2016   Urine Drug Screen: No results found for: LABOPIA, COCAINSCRNUR, LABBENZ, AMPHETMU, THCU, LABBARB    IMAGING  Ct Angio Head and Neck W/cm &/or Wo Cm 05/03/2016   1. Atherosclerotic changes at the left carotid bifurcation without significant stenosis.  2. Tortuosity of the cervical internal carotid arteries bilaterally, more prominent on the left. There is no associated stenosis.  3. Mildly enlarged right level 2 lymph nodes are nonspecific. No primary lesion is present to suggest this is metastatic disease. Lymphoma is also considered. These are most likely reactive.  4. Atherosclerotic changes within the cavernous internal carotid arteries bilaterally without significant stenosis.  5. Mild diffuse intracranial small vessel disease likely reflecting atherosclerotic change. No significant proximal stenosis, aneurysm, or branch vessel occlusion within the circle of Willis.     Ct Head Wo Contrast 05/03/2016   No acute abnormalities      PHYSICAL EXAM  Pleasant  elderly lady currently not in distress. . Afebrile. Head is nontraumatic. Neck is supple without bruit.    Cardiac exam no murmur or gallop. Lungs are clear to auscultation. Distal pulses are well felt.  Neurological Exam ;  Awake  Alert oriented x 3. Normal speech and language.eye movements full without nystagmus.fundi were not visualized. Vision acuity and fields appear normal. Hearing is normal. Palatal movements are normal. Face symmetric. Tongue  midline. Normal strength, tone, reflexes and coordination. Normal sensation. Gait deferred.     ASSESSMENT/PLAN [name] is a 75 y.o. 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 She did not receive IV t-PA due to rapid resolution of deficits.  Possible TIA:  Non-dominant felt to be embolic secondary to atrial fibrillation.  Resultant resolution of deficits  MRI  permanent pacemaker  MRA  permanent pacemaker  Carotid Doppler  refer to CTA of the head and neck  CTA of the head and neck - unremarkable except for nonspecific lymph node enlargement.  2D Echo - EF 35-40%. No cardiac source of emboli identified.  LDL 81  HgbA1c pending  VTE prophylaxis - Eliquis Diet - low sodium heart healthy  Eliquis (apixaban) daily prior to admission, now on Eliquis (apixaban) daily  Patient counseled to be compliant with her antithrombotic medications  Ongoing aggressive stroke risk factor management  Therapy recommendations: No follow-up PT or OT recommended  Disposition:  Discharged to home.  Hypertension  Blood pressure tends to run low  Permissive hypertension (OK if < 220/120) but gradually normalize in 5-7 days  Hyperlipidemia  Home meds:  Crestor 10 mg daily resumed in hospital  LDL 81, goal < 70  Crestor increased to 20 mg daily  Continue statin at discharge    Other Stroke Risk Factors  Advanced age  Family hx stroke (father and brother)  Atrial fibrillation on anticoagulation  Migraines   Other Active Problems  Ejection fraction 35-40%  Hospital day #   Mikey Bussing PA-C [facility] Pager [phone] 05/04/2016, 5:25 PM I have personally examined this patient, reviewed notes,  independently viewed imaging studies, participated in medical decision making and plan of care. I have made any additions or clarifications directly to the above note. Agree with note above. Patient has had a possible TIA due to atrial fibrillation despite being on anticoagulation with eliquis. Unfortunately there are no necessarily superior treatment options. The several of the liver new anti-cognizance have not been compared to each other hence I do not believe necessarily switching to Pradaxa on Xarelto is necessarily the better choice particularly since this may impact patient's co-pay. I explained this to the patient and she expressed understanding and would like to stay on eliquis. Greater than 50% time during this 25 minute visit was spent on counseling and coordination of care for stroke risk from A. fib and risk prevention Antony Contras, MD Medical Director [facility] Pager: [phone] 05/08/2016 4:39 PM  Antony Contras, MD Medical Director [place] Pager: [phone] 05/08/2016 4:38 PM     To contact Stroke Continuity provider, please refer to [email or web address] After hours, contact General Neurology

## 2016-05-06 ENCOUNTER — Telehealth: Payer: Self-pay

## 2016-05-06 LAB — ANA: Anti Nuclear Antibody(ANA): NEGATIVE

## 2016-05-06 LAB — MITOCHONDRIAL ANTIBODIES: Mitochondrial M2 Ab, IgG: <=20 Units (ref <=20.0)

## 2016-05-06 LAB — ANTI-SMOOTH MUSCLE ANTIBODY, IGG: Smooth Muscle Ab: <20 U (ref <20)

## 2016-05-06 LAB — HEMOGLOBIN A1C
Hgb A1c MFr Bld: 6 % — ABNORMAL HIGH (ref 4.8–5.6)
Mean Plasma Glucose: 126 mg/dL

## 2016-05-06 NOTE — Telephone Encounter (Signed)
Transition Care Management Follow-up Telephone Call   Date discharged? 05/04/16   How have you been since you were released from the hospital?  I feel good! I am having no weakness, numbness or slurred speech.  No pain. No N/V/D.  No problems eating/drinking.  No issues voiding/BM.  No change in weight since discharge.   Do you understand why you were in the hospital? YES, I had a TIA that lasted about 5 minutes and I wanted tests done.   Do you understand the discharge instructions? YES, increase activity as tolerated, start CRESTOR 20mg , follow up with PCP, Neurology, GI and Cardiology.  Check weight daily.   Where were you discharged to? HOME.   Items Reviewed:  Medications reviewed: YES, continuing medication which has changed CRESTOR 20mg  without issues.  Continuing all other scheduled medications which have not changed.  Allergies reviewed: YES, Meloxicam, Codeine, Doxycycline, Hydrocodone, Oxycodone.  Dietary changes reviewed: YES, heart healthy, low sodium diet.  No issues.  Referrals reviewed: YES, appointments scheduled with PCP, GI and Cardiology.  To be scheduled with Neurology after visit to PCP.   Functional Questionnaire:   Activities of Daily Living (ADLs):   She states they are independent in the following: Independent in all ADLs. States they require assistance with the following: Does not require assistance at this time.   Any transportation issues/concerns?: NO.   Any patient concerns? YES, does not have a Garment/textile technologist.  Discuss best options with PCP.   Confirmed importance and date/time of follow-up visits scheduled YES, appointment scheduled 05/09/16 at 9:30. REPEAT BMET to follow electrolytes and renal function.  LFT's and lipid panel in 8 weeks, statins adjusted.  Provider Appointment booked with Dr. Derrel Nip (PCP).  Confirmed with patient if condition begins to worsen call PCP or go to the ER.  Patient was given the office number and encouraged to call  back with question or concerns.  : YES, verbalized understanding.

## 2016-05-06 NOTE — Telephone Encounter (Signed)
Unable to reach patient.  Called 3 numbers listed on account.  HIPPA compliant.  Appointment to be scheduled with PCP 05/09/16 at 9:30. Will continue to follow with transitional care management as appropriate.  Will try to call again.

## 2016-05-09 ENCOUNTER — Encounter: Payer: Self-pay | Admitting: Internal Medicine

## 2016-05-09 ENCOUNTER — Ambulatory Visit (INDEPENDENT_AMBULATORY_CARE_PROVIDER_SITE_OTHER): Payer: Medicare Other | Admitting: Internal Medicine

## 2016-05-09 VITALS — BP 116/78 | HR 90 | Temp 97.8°F | Resp 12 | Ht 65.0 in | Wt 139.0 lb

## 2016-05-09 DIAGNOSIS — Z5189 Encounter for other specified aftercare: Secondary | ICD-10-CM | POA: Diagnosis not present

## 2016-05-09 DIAGNOSIS — Z23 Encounter for immunization: Secondary | ICD-10-CM

## 2016-05-09 DIAGNOSIS — E785 Hyperlipidemia, unspecified: Secondary | ICD-10-CM | POA: Diagnosis not present

## 2016-05-09 DIAGNOSIS — K764 Peliosis hepatis: Secondary | ICD-10-CM | POA: Diagnosis not present

## 2016-05-09 DIAGNOSIS — G458 Other transient cerebral ischemic attacks and related syndromes: Secondary | ICD-10-CM | POA: Diagnosis not present

## 2016-05-09 DIAGNOSIS — Z79899 Other long term (current) drug therapy: Secondary | ICD-10-CM

## 2016-05-09 DIAGNOSIS — I5021 Acute systolic (congestive) heart failure: Secondary | ICD-10-CM | POA: Diagnosis not present

## 2016-05-09 DIAGNOSIS — Z09 Encounter for follow-up examination after completed treatment for conditions other than malignant neoplasm: Secondary | ICD-10-CM

## 2016-05-09 LAB — BRAIN NATRIURETIC PEPTIDE: Pro B Natriuretic peptide (BNP): 315 pg/mL — ABNORMAL HIGH (ref 0.0–100.0)

## 2016-05-09 NOTE — Progress Notes (Signed)
Subjective:  Patient ID: [name] [name], female    DOB: 03-Sep-1941  Age: 75 y.o. MRN: [id number]  CC: The primary encounter diagnosis was Other specified transient cerebral ischemias. Diagnoses of Hyperlipidemia with target LDL less than 70, Long-term use of high-risk medication, Acute systolic congestive heart failure ([place]), Peliosis hepatis, Hospital discharge follow-up, and Hyperlipidemia were also pertinent to this visit.  HPI [name] [name] presents for hospital follow up,  Admitted to [facility] on June 2 for TIA workup secondary to history of sudden onset of left foot paralysis and numbness followed by left hand numbness, both transient   Occurred while driving. , lasted 5 minutes.  No dysarthria , dysphagia.  On June 1. Drove herslef home (4 hour trip)  Was admitted to [facility] on June 2 after discussing symptoms with her GI doctor who directed her to ER. Admitted ,  Was  seen by Neurology..  Eliquis was not changed (had been switched from coumadin to Eliquis two weeks prior) .  But Crestor dose was increased to 20 mg daily for goal LDL < 70. Told to avoid strenuous activity Not sure whether to avoid the "[place]" program .  Did it on Monday   Admitted to [facility] for 26 hr stay. Had a CTof the head and CTA  Brain showed atherosclerosis ,  But not significant stenosis of cerebral arteries and carotid arteries .   Has been following a low glycemic index diet since a1c was 6.0 in April.    Has decided to to sell the much loved home and move to [facility] after this event.    Needs an H & P documented by me and ALL specialists. Wants medical history cleaned up.  Used albuterol Monday night  after reviewng BNP on MyChart labs    Outpatient Prescriptions Prior to Visit  Medication Sig Dispense Refill  . albuterol (PROVENTIL HFA;VENTOLIN HFA) 108 (90 BASE) MCG/ACT inhaler Inhale 2 puffs into the lungs every 6 (six) hours as needed for wheezing or shortness of breath. 1 Inhaler 2  . ALPRAZolam  (XANAX) 0.25 MG tablet Take 1 tablet (0.25 mg total) by mouth at bedtime as needed for anxiety or sleep. 30 tablet 5  . amoxicillin (AMOXIL) 500 MG tablet Take 4 tablets (2,000 mg total) by mouth once. Take 4 tablets 60 minutes prior to dental work 30 tablet 1  . apixaban (ELIQUIS) 5 MG TABS tablet Take 1 tablet (5 mg total) by mouth 2 (two) times daily. 180 tablet 3  . azelastine (ASTELIN) 0.1 % nasal spray Place 1 spray into both nostrils as needed for allergies.   5  . b complex vitamins tablet Take 1 tablet by mouth daily.    . butalbital-acetaminophen-caffeine (FIORICET, ESGIC) 50-325-40 MG tablet TAKE 1 TABLET BY MOUTH AS NEEDED FOR BACK PAIN OR HEADACHE 90 tablet 2  . Cholecalciferol (VITAMIN D) 2000 units CAPS Take 2,000 Units by mouth daily.    . Coenzyme Q10 (CO Q 10) 100 MG CAPS Take 100 mg by mouth daily.    . furosemide (LASIX) 40 MG tablet Take 1 tablet (40 mg total) by mouth daily as needed for fluid. (Patient taking differently: Take 40 mg by mouth daily as needed for fluid (As needed for weight gain <2lb). ) 30 tablet 11  . metoprolol tartrate (LOPRESSOR) 25 MG tablet Take 0.5 tablets (12.5 mg total) by mouth 2 (two) times daily. 90 tablet 3  . montelukast (SINGULAIR) 10 MG tablet Take 10 mg by mouth  at bedtime.     . traMADol (ULTRAM) 50 MG tablet Take 1-2 tablets (50-100 mg total) by mouth every 4 (four) hours as needed for moderate pain. 30 tablet 0  . rosuvastatin (CRESTOR) 10 MG tablet Take 2 tablets (20 mg total) by mouth daily. 60 tablet 2   No facility-administered medications prior to visit.    Review of Systems;  Patient denies headache, fevers, malaise, unintentional weight loss, skin rash, eye pain, sinus congestion and sinus pain, sore throat, dysphagia,  hemoptysis , cough, dyspnea, wheezing, chest pain, palpitations, orthopnea, edema, abdominal pain, nausea, melena, diarrhea, constipation, flank pain, dysuria, hematuria, urinary  Frequency, nocturia, numbness,  tingling, seizures,  Focal weakness, Loss of consciousness,  Tremor, insomnia, depression, anxiety, and suicidal ideation.      Objective:  BP 116/78 mmHg  Pulse 90  Temp(Src) 97.8 F (36.6 C) (Oral)  Resp 12  Ht 5\' 5"  (1.651 m)  Wt 139 lb (63.05 kg)  BMI 23.13 kg/m2  SpO2 99%  LMP  (Exact Date)  BP Readings from Last 3 Encounters:  05/09/16 116/78  05/04/16 100/54  05/03/16 102/70    Wt Readings from Last 3 Encounters:  05/09/16 139 lb (63.05 kg)  05/03/16 139 lb 1.8 oz (63.1 kg)  05/03/16 139 lb 12.8 oz (63.413 kg)    General appearance: alert, cooperative and appears stated age Ears: normal TM's and external ear canals both ears Throat: lips, mucosa, and tongue normal; teeth and gums normal Neck: no adenopathy, no carotid bruit, supple, symmetrical, trachea midline and thyroid not enlarged, symmetric, no tenderness/mass/nodules Back: symmetric, no curvature. ROM normal. No CVA tenderness. Lungs: clear to auscultation bilaterally Heart: regular rate and rhythm, S1, S2 normal, no murmur, click, rub or gallop Abdomen: soft, non-tender; bowel sounds normal; no masses,  no organomegaly Pulses: 2+ and symmetric Skin: Skin color, texture, turgor normal. No rashes or lesions Lymph nodes: Cervical, supraclavicular, and axillary nodes normal.  Lab Results  Component Value Date   HGBA1C 6.0* 05/04/2016   HGBA1C 6.0 02/15/2016   HGBA1C 5.7* 10/27/2015    Lab Results  Component Value Date   CREATININE 0.89 05/03/2016   CREATININE 0.83 05/03/2016   CREATININE 0.96 02/15/2016    Lab Results  Component Value Date   WBC 5.2 05/03/2016   HGB 11.7* 05/03/2016   HCT 37.1 05/03/2016   PLT 109* 05/03/2016   GLUCOSE 136* 05/03/2016   CHOL 154 05/04/2016   TRIG 63 05/04/2016   HDL 60 05/04/2016   LDLDIRECT 113.0 02/15/2016   LDLCALC 81 05/04/2016   ALT 22 05/03/2016   AST 29 05/03/2016   NA 139 05/03/2016   K 3.7 05/03/2016   CL 106 05/03/2016   CREATININE 0.89  05/03/2016   BUN 23* 05/03/2016   CO2 27 05/03/2016   TSH 2.12 08/01/2014   INR 1.29 05/03/2016   HGBA1C 6.0* 05/04/2016    Ct Angio Head W/cm &/or Wo Cm  05/03/2016  ADDENDUM REPORT: 05/03/2016 20:40 CONTRAST:  50 mL Isovue 370. Electronically Signed   By: San Morelle M.D.   On: 05/03/2016 20:40  05/03/2016  CLINICAL DATA:  Transient episode of numbness and weakness to the left upper and lower extremity yesterday lasting 5 minutes. Symptoms have since resolved. EXAM: CT ANGIOGRAPHY HEAD AND NECK TECHNIQUE: Multidetector CT imaging of the head and neck was performed using the standard protocol during bolus administration of intravenous contrast. Multiplanar CT image reconstructions and MIPs were obtained to evaluate the vascular anatomy. Carotid stenosis measurements (when  applicable) are obtained utilizing NASCET criteria, using the distal internal carotid diameter as the denominator. COMPARISON:  CT head without contrast from the same day. FINDINGS: CTA NECK Aortic arch: A 3 vessel arch configuration is present. Minimal atherosclerotic calcifications are present at the aortic arch without significant stenosis. Right carotid system: The right common carotid artery is mildly tortuous. The bifurcation is unremarkable. Mild tortuosity is present in the cervical right ICA without significant stenosis. Left carotid system: The left common carotid artery is within normal limits. Calcifications are present at the carotid bifurcation without significant stenosis. Focal tortuosity is present proximally in the cervical left ICA without a significant stenosis. Vertebral arteries:The vertebral arteries originate from the subclavian arteries bilaterally without significant stenosis. There is mild tortuosity proximally. The right vertebral artery is the dominant vessel. There is no focal stenosis of either vertebral artery in the neck. Skeleton: Vertebral body heights and alignment are normal. No focal lytic or  blastic lesions are present. ORIF of the mandible is noted. Other neck: No focal mucosal or submucosal lesions are present. Vocal cords are midline and symmetric. The tongue base is unremarkable. Salivary glands are within normal limits. Mildly enlarged right level 2 lymph nodes measure up to 9 mm. No other significant adenopathy is present. CTA HEAD Anterior circulation: Atherosclerotic calcifications are present through the cavernous internal carotid arteries bilaterally without a significant stenosis. The A1 and M1 segments are normal. The anterior communicating artery is patent. MCA bifurcations are intact. Mild diffuse small vessel irregularity is present throughout the anterior circulation. Posterior circulation: PICA origins are visualized and normal bilaterally. The basilar artery is normal. Both posterior cerebral arteries originate from the basilar tip. A left posterior communicating artery contributes. PCA branch vessels are intact bilaterally. Venous sinuses: The dural sinuses are patent. The transverse sinuses are codominant. The straight sinus and deep cerebral veins are intact. The cortical veins are unremarkable. Anatomic variants: None. Delayed phase: The postcontrast images demonstrate no pathologic enhancement. IMPRESSION: 1. Atherosclerotic changes at the left carotid bifurcation without significant stenosis. 2. Tortuosity of the cervical internal carotid arteries bilaterally, more prominent on the left. There is no associated stenosis. 3. Mildly enlarged right level 2 lymph nodes are nonspecific. No primary lesion is present to suggest this is metastatic disease. Lymphoma is also considered. These are most likely reactive. 4. Atherosclerotic changes within the cavernous internal carotid arteries bilaterally without significant stenosis. 5. Mild diffuse intracranial small vessel disease likely reflecting atherosclerotic change. No significant proximal stenosis, aneurysm, or branch vessel  occlusion within the circle of Willis. Electronically Signed: By: San Morelle M.D. On: 05/03/2016 19:43   Ct Head Wo Contrast  05/03/2016  CLINICAL DATA:  Left-sided numbness yesterday. EXAM: CT HEAD WITHOUT CONTRAST TECHNIQUE: Contiguous axial images were obtained from the base of the skull through the vertex without intravenous contrast. COMPARISON:  March 26, 2013 FINDINGS: Paranasal sinuses, mastoid air cells, and middle ears, bones, and extracranial soft tissues demonstrate no acute abnormalities. Unusual shape of the right globe, unchanged and of no acute significance. No subdural, epidural, or subarachnoid hemorrhage. Ventricles and sulci are normal for age. Minimal white matter changes. No acute cortical ischemia or infarct. No mass, mass effect, or midline shift. The cerebellum, brainstem, and basal cisterns are within normal limits. IMPRESSION: No acute abnormalities Electronically Signed   By: Dorise Bullion III M.D   On: 05/03/2016 13:19   Ct Angio Neck W/cm &/or Wo/cm  05/03/2016  ADDENDUM REPORT: 05/03/2016 20:40 CONTRAST:  50 mL Isovue  370. Electronically Signed   By: San Morelle M.D.   On: 05/03/2016 20:40  05/03/2016  CLINICAL DATA:  Transient episode of numbness and weakness to the left upper and lower extremity yesterday lasting 5 minutes. Symptoms have since resolved. EXAM: CT ANGIOGRAPHY HEAD AND NECK TECHNIQUE: Multidetector CT imaging of the head and neck was performed using the standard protocol during bolus administration of intravenous contrast. Multiplanar CT image reconstructions and MIPs were obtained to evaluate the vascular anatomy. Carotid stenosis measurements (when applicable) are obtained utilizing NASCET criteria, using the distal internal carotid diameter as the denominator. COMPARISON:  CT head without contrast from the same day. FINDINGS: CTA NECK Aortic arch: A 3 vessel arch configuration is present. Minimal atherosclerotic calcifications are present at  the aortic arch without significant stenosis. Right carotid system: The right common carotid artery is mildly tortuous. The bifurcation is unremarkable. Mild tortuosity is present in the cervical right ICA without significant stenosis. Left carotid system: The left common carotid artery is within normal limits. Calcifications are present at the carotid bifurcation without significant stenosis. Focal tortuosity is present proximally in the cervical left ICA without a significant stenosis. Vertebral arteries:The vertebral arteries originate from the subclavian arteries bilaterally without significant stenosis. There is mild tortuosity proximally. The right vertebral artery is the dominant vessel. There is no focal stenosis of either vertebral artery in the neck. Skeleton: Vertebral body heights and alignment are normal. No focal lytic or blastic lesions are present. ORIF of the mandible is noted. Other neck: No focal mucosal or submucosal lesions are present. Vocal cords are midline and symmetric. The tongue base is unremarkable. Salivary glands are within normal limits. Mildly enlarged right level 2 lymph nodes measure up to 9 mm. No other significant adenopathy is present. CTA HEAD Anterior circulation: Atherosclerotic calcifications are present through the cavernous internal carotid arteries bilaterally without a significant stenosis. The A1 and M1 segments are normal. The anterior communicating artery is patent. MCA bifurcations are intact. Mild diffuse small vessel irregularity is present throughout the anterior circulation. Posterior circulation: PICA origins are visualized and normal bilaterally. The basilar artery is normal. Both posterior cerebral arteries originate from the basilar tip. A left posterior communicating artery contributes. PCA branch vessels are intact bilaterally. Venous sinuses: The dural sinuses are patent. The transverse sinuses are codominant. The straight sinus and deep cerebral veins are  intact. The cortical veins are unremarkable. Anatomic variants: None. Delayed phase: The postcontrast images demonstrate no pathologic enhancement. IMPRESSION: 1. Atherosclerotic changes at the left carotid bifurcation without significant stenosis. 2. Tortuosity of the cervical internal carotid arteries bilaterally, more prominent on the left. There is no associated stenosis. 3. Mildly enlarged right level 2 lymph nodes are nonspecific. No primary lesion is present to suggest this is metastatic disease. Lymphoma is also considered. These are most likely reactive. 4. Atherosclerotic changes within the cavernous internal carotid arteries bilaterally without significant stenosis. 5. Mild diffuse intracranial small vessel disease likely reflecting atherosclerotic change. No significant proximal stenosis, aneurysm, or branch vessel occlusion within the circle of Willis. Electronically Signed: By: San Morelle M.D. On: 05/03/2016 19:43    Assessment & Plan:   Problem List Items Addressed This Visit    Hyperlipidemia (Chronic)    Dose of crestor was increased to 20 mg during hospitalization for TIA.  She will return for fasting labs in 6 weeks.       Relevant Medications   rosuvastatin (CRESTOR) 20 MG tablet   TIA (transient ischemic attack) -  Primary    With left sided numbness and weakness.  Etiology unclear.  CT head and CTAngiofram were negative for acute infracts and significant stenosis.  She has had a recent comprehensive cardiac workup given the recent diagnosis of atrial fib so  A PFO is unlikely as it should have been found  On prior ECHOs.Advsied to keep appointment with Neurology       Relevant Medications   rosuvastatin (CRESTOR) 20 MG tablet   Other Relevant Orders   Ambulatory referral to Neurology   Hospital discharge follow-up    Patient is stable post discharge from [facility] on June 3 and has no new issues or questions about discharge plans at the visit today for hospital follow  up.  I have reviewed the records from the hospital admission in detail with patient today.      Peliosis hepatis   Relevant Medications   rosuvastatin (CRESTOR) 20 MG tablet   Other Relevant Orders   Hepatitis A hepatitis B combined vaccine IM (Completed)    Other Visit Diagnoses    Hyperlipidemia with target LDL less than 70        Relevant Medications    rosuvastatin (CRESTOR) 20 MG tablet    Other Relevant Orders    Lipid panel    Long-term use of high-risk medication        Relevant Orders    Comprehensive metabolic panel    Acute systolic congestive heart failure (HCC)        Relevant Medications    rosuvastatin (CRESTOR) 20 MG tablet    Other Relevant Orders    B Nat Peptide (Completed)       I am having Ms. Niswonger maintain her montelukast, albuterol, ALPRAZolam, azelastine, traMADol, amoxicillin, metoprolol tartrate, butalbital-acetaminophen-caffeine, Vitamin D, Co Q 10, b complex vitamins, apixaban, furosemide, and rosuvastatin.  Meds ordered this encounter  Medications  . rosuvastatin (CRESTOR) 20 MG tablet    Sig: TK 1 T PO QD    Refill:  2    Medications Discontinued During This Encounter  Medication Reason  . rosuvastatin (CRESTOR) 10 MG tablet Change in therapy    Follow-up: Return for 4 weeks fasting labs .   [name], MD

## 2016-05-09 NOTE — Progress Notes (Signed)
Pre-visit discussion using our clinic review tool. No additional management support is needed unless otherwise documented below in the visit note.  

## 2016-05-09 NOTE — Patient Instructions (Addendum)
You can continue using the treadmill at 2.5 mi/hr and any aerobic exercise that has you sitting  Avoid squatting and lunging.  Ok to do stretching and simple basic Yoga moves  You DO NEED to see a neurologist . I have made the referral since you have not been called   We will repeat your fasting labs in  6 weeks   We will repeat your A1c in 3 months

## 2016-05-10 ENCOUNTER — Ambulatory Visit (INDEPENDENT_AMBULATORY_CARE_PROVIDER_SITE_OTHER)
Admission: RE | Admit: 2016-05-10 | Discharge: 2016-05-10 | Disposition: A | Discharge status: Home / Self Care | Payer: Medicare Other | Source: Ambulatory Visit | Attending: Internal Medicine | Admitting: Internal Medicine

## 2016-05-10 DIAGNOSIS — K764 Peliosis hepatis: Secondary | ICD-10-CM | POA: Diagnosis not present

## 2016-05-10 MED ORDER — IOPAMIDOL (ISOVUE-300) INJECTION 61%
100.0000 mL | Freq: Once | INTRAVENOUS | Status: AC | PRN
  Administered 2016-05-10: 100 mL via INTRAVENOUS

## 2016-05-11 NOTE — Assessment & Plan Note (Addendum)
With left sided numbness and weakness.  Etiology unclear.  CT head and CTAngiofram were negative for acute infracts and significant stenosis.  She has had a recent comprehensive cardiac workup given the recent diagnosis of atrial fib so  A PFO is unlikely as it should have been found  On prior ECHOs.Advsied to keep appointment with Neurology

## 2016-05-11 NOTE — Assessment & Plan Note (Signed)
Patient is stable post discharge from [facility] on June 3 and has no new issues or questions about discharge plans at the visit today for hospital follow up.  I have reviewed the records from the hospital admission in detail with patient today.

## 2016-05-11 NOTE — Assessment & Plan Note (Signed)
Dose of crestor was increased to 20 mg during hospitalization for TIA.  She will return for fasting labs in 6 weeks.

## 2016-05-12 ENCOUNTER — Encounter: Payer: Self-pay | Admitting: Internal Medicine

## 2016-05-13 ENCOUNTER — Other Ambulatory Visit: Payer: Self-pay

## 2016-05-13 DIAGNOSIS — R945 Abnormal results of liver function studies: Principal | ICD-10-CM

## 2016-05-13 DIAGNOSIS — K7689 Other specified diseases of liver: Secondary | ICD-10-CM

## 2016-05-13 DIAGNOSIS — R7989 Other specified abnormal findings of blood chemistry: Secondary | ICD-10-CM

## 2016-05-14 ENCOUNTER — Encounter: Payer: Medicare Other | Admitting: Internal Medicine

## 2016-05-15 ENCOUNTER — Encounter: Payer: Self-pay | Admitting: Internal Medicine

## 2016-05-17 ENCOUNTER — Ambulatory Visit (HOSPITAL_COMMUNITY): Payer: Medicare Other | Admitting: Nurse Practitioner

## 2016-05-21 ENCOUNTER — Ambulatory Visit: Payer: Medicare Other | Admitting: Internal Medicine

## 2016-05-22 ENCOUNTER — Ambulatory Visit (INDEPENDENT_AMBULATORY_CARE_PROVIDER_SITE_OTHER): Payer: Medicare Other

## 2016-05-22 ENCOUNTER — Other Ambulatory Visit (INDEPENDENT_AMBULATORY_CARE_PROVIDER_SITE_OTHER): Payer: Medicare Other

## 2016-05-22 ENCOUNTER — Other Ambulatory Visit: Payer: Self-pay | Admitting: Cardiovascular Disease

## 2016-05-22 ENCOUNTER — Ambulatory Visit (INDEPENDENT_AMBULATORY_CARE_PROVIDER_SITE_OTHER): Payer: Medicare Other | Admitting: Internal Medicine

## 2016-05-22 ENCOUNTER — Encounter: Payer: Self-pay | Admitting: Cardiovascular Disease

## 2016-05-22 ENCOUNTER — Encounter: Payer: Self-pay | Admitting: Internal Medicine

## 2016-05-22 VITALS — BP 100/68 | HR 81 | Temp 97.7°F | Resp 12 | Ht 65.0 in | Wt 138.5 lb

## 2016-05-22 DIAGNOSIS — Z953 Presence of xenogenic heart valve: Secondary | ICD-10-CM | POA: Diagnosis not present

## 2016-05-22 DIAGNOSIS — L03314 Cellulitis of groin: Secondary | ICD-10-CM | POA: Diagnosis not present

## 2016-05-22 DIAGNOSIS — M810 Age-related osteoporosis without current pathological fracture: Secondary | ICD-10-CM | POA: Diagnosis not present

## 2016-05-22 DIAGNOSIS — Z9889 Other specified postprocedural states: Secondary | ICD-10-CM | POA: Diagnosis not present

## 2016-05-22 DIAGNOSIS — Z8679 Personal history of other diseases of the circulatory system: Secondary | ICD-10-CM

## 2016-05-22 DIAGNOSIS — Z1239 Encounter for other screening for malignant neoplasm of breast: Secondary | ICD-10-CM

## 2016-05-22 DIAGNOSIS — Z5181 Encounter for therapeutic drug level monitoring: Secondary | ICD-10-CM

## 2016-05-22 DIAGNOSIS — Z0000 Encounter for general adult medical examination without abnormal findings: Secondary | ICD-10-CM | POA: Diagnosis not present

## 2016-05-22 MED ORDER — SULFAMETHOXAZOLE-TRIMETHOPRIM 800-160 MG PO TABS: 1.0000 | ORAL_TABLET | Freq: Two times a day (BID) | ORAL | Status: DC

## 2016-05-22 NOTE — Progress Notes (Signed)
Pt was started on Eliquis 5mg  BID for afib on 04/17/16 by Dr. Acie Fredrickson.    Reviewed patients medication list.  Pt is not currently on any combined P-gp and strong CYP3A4 inhibitors/inducers (ketoconazole, traconazole, ritonavir, carbamazepine, phenytoin, rifampin, St. John's wort).  Reviewed labs.  SCr 0.78, Weight 63kg, Age 75.  Dose appropriate based on specified criteria.   Hgb and HCT 11.2/33.9 stable.  Dr Acie Fredrickson spoke with pt and Dr Leonie Man, they have decided to have pt continue on Eliquis 5mg  BID and not switch back to Warfarin.

## 2016-05-22 NOTE — Patient Instructions (Addendum)
I called in Septra Ds to take twcie daily for the next week.  This will treat the cellulitis surrondning the bug bite(?) in your groin.  If the area becomes swollen or if the redness spreads,  Call me   DEXA and mammogram ordered

## 2016-05-22 NOTE — Progress Notes (Signed)
Pre-visit discussion using our clinic review tool. No additional management support is needed unless otherwise documented below in the visit note.  

## 2016-05-22 NOTE — Patient Instructions (Signed)

## 2016-05-23 ENCOUNTER — Encounter: Payer: Self-pay | Admitting: Internal Medicine

## 2016-05-23 ENCOUNTER — Ambulatory Visit (INDEPENDENT_AMBULATORY_CARE_PROVIDER_SITE_OTHER): Payer: Medicare Other | Admitting: Internal Medicine

## 2016-05-23 ENCOUNTER — Telehealth: Payer: Self-pay | Admitting: Cardiovascular Disease

## 2016-05-23 ENCOUNTER — Telehealth: Payer: Self-pay | Admitting: Internal Medicine

## 2016-05-23 VITALS — BP 102/74 | HR 81 | Ht 65.0 in | Wt 139.0 lb

## 2016-05-23 DIAGNOSIS — I495 Sick sinus syndrome: Secondary | ICD-10-CM | POA: Diagnosis not present

## 2016-05-23 DIAGNOSIS — Z0000 Encounter for general adult medical examination without abnormal findings: Secondary | ICD-10-CM

## 2016-05-23 HISTORY — DX: Encounter for general adult medical examination without abnormal findings: Z00.00

## 2016-05-23 LAB — BASIC METABOLIC PANEL
BUN/Creatinine Ratio: 37 — ABNORMAL HIGH (ref 12–28)
BUN: 29 mg/dL — ABNORMAL HIGH (ref 8–27)
CO2: 24 mmol/L (ref 18–29)
Calcium: 9.5 mg/dL (ref 8.7–10.3)
Chloride: 103 mmol/L (ref 96–106)
Creatinine, Ser: 0.78 mg/dL (ref 0.57–1.00)
GFR calc Af Amer: 86 mL/min/{1.73_m2} (ref >59)
GFR calc non Af Amer: 75 mL/min/{1.73_m2} (ref >59)
Glucose: 100 mg/dL — ABNORMAL HIGH (ref 65–99)
Potassium: 5.7 mmol/L — ABNORMAL HIGH (ref 3.5–5.2)
Sodium: 142 mmol/L (ref 134–144)

## 2016-05-23 LAB — CUP PACEART INCLINIC DEVICE CHECK
Battery Impedance: 112 Ohm
Battery Remaining Longevity: 125 mo
Battery Voltage: 2.79 V
Brady Statistic AP VP Percent: 82 %
Brady Statistic AP VS Percent: 0 %
Brady Statistic AS VP Percent: 18 %
Brady Statistic AS VS Percent: 0 %
Date Time Interrogation Session: 20170622094253
Implantable Lead Implant Date: 20161205
Implantable Lead Implant Date: 20170321
Implantable Lead Location: 753859
Implantable Lead Location: 753860
Implantable Lead Model: 5076
Implantable Lead Model: 5076
Lead Channel Impedance Value: 479 Ohm
Lead Channel Impedance Value: 765 Ohm
Lead Channel Pacing Threshold Amplitude: 0.5 V
Lead Channel Pacing Threshold Amplitude: 0.5 V
Lead Channel Pacing Threshold Pulse Width: 0.4 ms
Lead Channel Pacing Threshold Pulse Width: 0.4 ms
Lead Channel Sensing Intrinsic Amplitude: 0.7 mV
Lead Channel Sensing Intrinsic Amplitude: 5.6 mV
Lead Channel Setting Pacing Amplitude: 2 V
Lead Channel Setting Pacing Amplitude: 2.5 V
Lead Channel Setting Pacing Pulse Width: 0.4 ms
Lead Channel Setting Sensing Sensitivity: 4 mV

## 2016-05-23 LAB — CBC
Hematocrit: 33.9 % — ABNORMAL LOW (ref 34.0–46.6)
Hemoglobin: 11.2 g/dL (ref 11.1–15.9)
MCH: 29.1 pg (ref 26.6–33.0)
MCHC: 33 g/dL (ref 31.5–35.7)
MCV: 88 fL (ref 79–97)
Platelets: 142 10*3/uL — ABNORMAL LOW (ref 150–379)
RBC: 3.85 x10E6/uL (ref 3.77–5.28)
RDW: 16.2 % — ABNORMAL HIGH (ref 12.3–15.4)
WBC: 5.6 10*3/uL (ref 3.4–10.8)

## 2016-05-23 NOTE — Assessment & Plan Note (Signed)
With prior traumatic fracture of mandible.  Completed 5 years of alendronate .  Taking prolia since 2015.  DEXA due to be repeated. Discussed the current controversies surrounding the risks and benefits of calcium supplementation.  Encouraged her to increase dietary calcium through natural foods including almond/coconut milk

## 2016-05-23 NOTE — Telephone Encounter (Signed)
H & P complete and returned to [facility] via red folder .  To be sent to patient .  No charge

## 2016-05-23 NOTE — Assessment & Plan Note (Signed)

## 2016-05-23 NOTE — Telephone Encounter (Signed)
New message     The pt is calling concerning the report she got a copy of the other day from her history and the pt noticed on the history report it states the pt had multiple fall on the date of Oct 2 nd 2015, and the pt states she has not fallen since 2014, and would like for the record to be corrected.   The pt wanted to speak with Sharyn Lull regarding this report as well

## 2016-05-23 NOTE — Assessment & Plan Note (Signed)
Unclear if the localized infection is an insect bite or Staph infected boil.  Will treat empirically with Septra DS . patient instructed to use warm compresses to encourage drainage and to call if redness spreads beyond current margins.  Probiotic use for 3 weeks advised.

## 2016-05-23 NOTE — Progress Notes (Signed)
HPI Ms. Gabay returns today for PPM followup. 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 She underwent PPM insertion. She has developed an increased atrial pacing threshold and underwent PM lead revision several months ago.  She has completed cardiac rehab. She noted a TIA for which she was evaluated and no cause found. She has had no atrial fib. Allergies  Allergen Reactions  . Meloxicam Other (See Comments)    Severe reflux  . Codeine Nausea And Vomiting    migraine  . Doxycycline Itching and Swelling    Facial  . Hydrocodone Nausea And Vomiting    MIGRAINE  . Hydromorphone Nausea And Vomiting    migraine  . Oxycodone Nausea And Vomiting    Severe migraine     Current Outpatient Prescriptions  Medication Sig Dispense Refill  . albuterol (PROVENTIL HFA;VENTOLIN HFA) 108 (90 BASE) MCG/ACT inhaler Inhale 2 puffs into the lungs every 6 (six) hours as needed for wheezing or shortness of breath. 1 Inhaler 2  . ALPRAZolam (XANAX) 0.25 MG tablet Take 1 tablet (0.25 mg total) by mouth at bedtime as needed for anxiety or sleep. 30 tablet 5  . amoxicillin (AMOXIL) 500 MG tablet Take 4 tablets (2,000 mg total) by mouth once. Take 4 tablets 60 minutes prior to dental work 30 tablet 1  . apixaban (ELIQUIS) 5 MG TABS tablet Take 1 tablet (5 mg total) by mouth 2 (two) times daily. 180 tablet 3  . azelastine (ASTELIN) 0.1 % nasal spray Place 1 spray into both nostrils as needed for allergies.   5  . b complex vitamins tablet Take 1 tablet by mouth daily.    . butalbital-acetaminophen-caffeine (FIORICET, ESGIC) 50-325-40 MG tablet TAKE 1 TABLET BY MOUTH AS NEEDED FOR BACK PAIN OR HEADACHE 90 tablet 2  . Cholecalciferol (VITAMIN D) 2000 units CAPS Take 2,000 Units by mouth daily.    . Coenzyme Q10 (CO Q 10) 100 MG CAPS Take 100 mg by mouth daily.    . furosemide (LASIX) 40 MG tablet Take 40 mg by mouth  as directed.    . metoprolol tartrate (LOPRESSOR) 25 MG tablet Take 0.5 tablets (12.5 mg total) by mouth 2 (two) times daily. 90 tablet 3  . montelukast (SINGULAIR) 10 MG tablet Take 10 mg by mouth at bedtime.     . rosuvastatin (CRESTOR) 20 MG tablet TK 1 T PO QD  2  . sulfamethoxazole-trimethoprim (BACTRIM DS,SEPTRA DS) 800-160 MG tablet Take 1 tablet by mouth 2 (two) times daily. 14 tablet 0  . traMADol (ULTRAM) 50 MG tablet Take 1-2 tablets (50-100 mg total) by mouth every 4 (four) hours as needed for moderate pain. 30 tablet 0   No current facility-administered medications for this visit.     Past Medical History  Diagnosis Date  . SVT (supraventricular tachycardia) ([place])   . MVP (mitral valve prolapse)   . Hyperlipidemia   . Idiopathic thrombocytopenic purpura (ITP)   . Hemorrhoid   . IBS (irritable bowel syndrome)   . Migraine   . Severe mitral regurgitation   . RBBB   . Anxiety associated with depression     Prn alprazolam   . Thoracic aorta atherosclerosis ([place])   . Nodule of right lung   . Restrictive lung disease     Mild on PFT & likely cardiac in etiology   . 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  Atrial fibrillation, persistent ([place])     DCCV 08/22/2015  . Acute on chronic diastolic (congestive) heart failure ([place])   . 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 S/P Minimally invasive maze operation for atrial fibrillation 10/31/2015    Complete bilateral atrial lesion set using cryothermy and bipolar radiofrequency ablation with clipping of LA appendage via right mini thoracotomy approach  . Bradycardia post-op bradycardia, pacer dependent    MDT PPM 11/06/15, Dr. Lovena Le  . Diverticulosis   . Hepatic cyst     innumerable    ROS:   All systems reviewed and negative except as noted in the HPI.   Past Surgical History   Procedure Laterality Date  . Tubal ligation    . Foot surgery    . Varicose vein surgery Right   . Mandible fracture surgery  03-26-13  . Colonoscopy  2003  . Eye surgery Right 2013  . Breast biopsy    . Cardioversion N/A 08/22/2015    Procedure: CARDIOVERSION;  Surgeon: Thayer Headings, MD;  Location: [facility] ENDOSCOPY;  Service: Cardiovascular;  Laterality: N/A;  . Tee without cardioversion N/A 08/22/2015    Procedure: TRANSESOPHAGEAL ECHOCARDIOGRAM (TEE);  Surgeon: Thayer Headings, MD;  Location: [facility] ENDOSCOPY;  Service: Cardiovascular;  Laterality: N/A;  Darden Dates with cardioversion    . Cardiac catheterization N/A 10/18/2015    Procedure: Right/Left Heart Cath and Coronary Angiography;  Surgeon: Sherren Mocha, MD;  Location: [place] CV LAB;  Service: Cardiovascular;  Laterality: N/A;  . Minimally invasive maze procedure N/A 10/31/2015    Procedure: MINIMALLY INVASIVE MAZE PROCEDURE;  Surgeon: Rexene Alberts, MD;  Location: [place];  Service: Open Heart Surgery;  Laterality: N/A;  . Tee without cardioversion N/A 10/31/2015    Procedure: TRANSESOPHAGEAL ECHOCARDIOGRAM (TEE);  Surgeon: Rexene Alberts, MD;  Location: [place];  Service: Open Heart Surgery;  Laterality: N/A;  . Mitral valve replacement Right 10/31/2015    Procedure: MINIMALLY INVASIVE MITRAL VALVE (MV) REPLACEMENT;  Surgeon: Rexene Alberts, MD;  Location: [place];  Service: Open Heart Surgery;  Laterality: Right;  . Ep implantable device N/A 11/06/2015    Procedure: Pacemaker Implant;  Surgeon: Evans Lance, MD;  Location: [facility];  Service: Cardiovascular;  Laterality: N/A;  . Pacemaker lead removal  02/20/2016  . Ep implantable device N/A 02/20/2016    Procedure: Lead Extraction;  Surgeon: Evans Lance, MD;  Location: [place] [facility] LAB;  Service: Cardiovascular;  Laterality: N/A;     Family History  Problem Relation Age of Onset  . Hypertension Mother   . Arrhythmia Mother   . Heart failure Mother   .  Arrhythmia Brother   . Stroke Brother 65    cerebral hemorrhage, nonsmoker, no HTN  . Prostate cancer Brother   . Stroke Father     from an aneurysm  . Stroke Maternal Aunt 83    cerebral hemorrhage  . Liver cancer Maternal Grandmother   . Atrial fibrillation Son   . Celiac disease Son   . Heart attack Neg Hx      Social History   Social History  . Marital Status: Widowed    Spouse Name: Deceased  . Number of Children: 2  . Years of Education: 16   Occupational History  . Retired    Social History Main Topics  .  Smoking status: Never Smoker   . Smokeless tobacco: Never Used  . Alcohol Use: No  . Drug Use: No  . Sexual Activity: No   Other Topics Concern  . Not on file   Social History Narrative   She is a widow.  Husband died from metastatic renal cell cancer. Originally from [place]. Previously lived in [place] from 1975-[place]. Moved to [place] in 2007. No mold exposure recently but did have it through a prior work exposure in 1993. Has a masters in public health. No bird exposure.     BP 102/74 mmHg  Pulse 81  Ht 5\' 5"  (1.651 m)  Wt 139 lb (63.05 kg)  BMI 23.13 kg/m2  LMP  (Exact Date)  Physical Exam:  Well appearing 75 yo woman, NAD HEENT: Unremarkable Neck:  6 cm JVD, no thyromegally Lymphatics:  No adenopathy Back:  No CVA tenderness Lungs:  Clear with no wheezes HEART:  Regular rate rhythm, no murmurs, no rubs, no clicks Abd:  soft, positive bowel sounds, no organomegally, no rebound, no guarding Ext:  2 plus pulses, no edema, no cyanosis, no clubbing Skin:  No rashes no nodules Neuro:  CN II through XII intact, motor grossly intact   DEVICE  Normal device function except for elevated pacing thresholds.  See PaceArt for details.   Assess/Plan: 1. PPM lead revision - she is doing well and her device is working normally. 2. Atrial fibrillation - she is s/p MAZE. She will continue anti-coagulation and beta blocker therapy. She is maintaining NSR off  of amiodarone.  3. Dyslipidemia - she will continue her statin therapy. 4. HTN - her blood pressure is well controlled. No change in meds.  Mikle Bosworth.D.

## 2016-05-23 NOTE — Telephone Encounter (Signed)
Spoke with patient who came in today for visit with Dr. Lovena Le.  She is planning to apply to an independent living facility and needs accurate documentation in records so that she will be placed appropriately.  I advised her that Trinidad Curet, RN, who was assisting Dr. Lovena Le today, gave me the paperwork and the patient's change request and that Dr. Acie Fredrickson will return to the office Monday.  I advised her that I will call her next week once the documentation change has been made.  I also discussed her elevated K+ level from bmet done yesterday.  She is not on a potassium supplement nor any other medications that might cause increase in K+ level. She eats a variety of meats, fruits, and vegetables but not an excessive amount of any of the high potassium containing foods.  She does use No Salt substitute as an alternative to salt so I advised her not to use this any longer.  She verbalized understanding and agreement and thanked me for the call.

## 2016-05-23 NOTE — Progress Notes (Signed)
Patient ID: [name], female    DOB: May 28, 1941  Age: 75 y.o. MRN: [id number]  The patient is here for annual physical wellness examination and management of other chronic and acute problems.   Colonoscopy 01/2014 no polyps  Due for annual mammogram   The risk factors are reflected in the social history.  The roster of all physicians providing medical care to patient - is listed in the Snapshot section of the chart.  Activities of daily living:  The patient is 100% independent in all ADLs: dressing, toileting, feeding as well as independent mobility  Home safety : The patient has smoke detectors in the home. They wear seatbelts.  There are no firearms at home. There is no violence in the home.   There is no risks for hepatitis, STDs or HIV. There is no   history of blood transfusion. They have no travel history to infectious disease endemic areas of the world.  The patient has seen their dentist in the last six month. They have seen their eye doctor in the last year. They have no t hearing difficulty.   They have deferred audiologic testing in the last year.  They do not  have excessive sun exposure. Discussed the need for sun protection: hats, long sleeves and use of sunscreen if there is significant sun exposure.   Diet: the importance of a healthy diet is discussed. They do have a healthy diet.  The benefits of regular aerobic exercise were discussed. She walks 4 times per week ,  20 minutes.   Depression screen: there are no signs or vegative symptoms of depression- irritability, change in appetite, anhedonia, sadness/tearfullness.  Cognitive assessment: the patient manages all their financial and personal affairs and is actively engaged. They could relate day,date,year and events; recalled 2/3 objects at 3 minutes; performed clock-face test normally.  The following portions of the patient's history were reviewed and updated as appropriate: allergies, current medications, past  family history, past medical history,  past surgical history, past social history  and problem list.  Visual acuity was not assessed per patient preference since she has regular follow up with her ophthalmologist. Hearing and body mass index were assessed and reviewed.   During the course of the visit the patient was educated and counseled about appropriate screening and preventive services including : fall prevention , diabetes screening, nutrition counseling, colorectal cancer screening, and recommended immunizations.    CC: The primary encounter diagnosis was Cellulitis of groin. Diagnoses of Osteoporosis, Breast cancer screening, Osteoporosis of forearm, and Visit for preventive health examination were also pertinent to this visit.   1) She has developed an erythematous papule in her pubic area in the left inguinal area that is itchy.  She found it 2 days ago and has been apply mupirocin with no significant improvement . She does not recall receivign an insect bite but she has been working in her flower garden.    2) She reports continued tenderness in her left supraclavicular without redness or ulceration of the skin, the pain started in March, when her pacemaker was replaced and is due to the edge of the leadwire   3) Osteoporosis .  She has been receiving Prolia injections , her most recent in April.  Last DEXA was two years ago. prior to initiation of Prolia. 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   She has past surgical history that includes Tubal ligation; Foot surgery; Varicose vein surgery (Right); Mandible fracture surgery (03-26-13); Colonoscopy (2003); Eye surgery (Right, 2013); Breast biopsy; Cardioversion (N/A, 08/22/2015); TEE without cardioversion (N/A, 08/22/2015); TEE with cardioversion; Cardiac catheterization (N/A, 10/18/2015); Minimally invasive maze procedure (N/A, 10/31/2015); TEE without cardioversion (N/A, 10/31/2015); Mitral valve replacement (Right, 10/31/2015); Cardiac catheterization (N/A, 11/06/2015); Pacemaker lead removal (02/20/2016); and Cardiac catheterization (N/A, 02/20/2016).   Her family history includes Arrhythmia in her brother and mother; Atrial fibrillation in her son; Celiac disease in her son; Heart failure in her mother; Hypertension in her mother; Liver cancer in her maternal grandmother; Prostate cancer in her brother; Stroke in her father; Stroke (age of onset: 29) in her brother; Stroke (age of onset: 20) in her maternal aunt. There is no history of Heart attack.She reports that she has never smoked. She has never used smokeless tobacco. She reports that she does not drink alcohol or use illicit drugs.  Outpatient Prescriptions Prior to Visit  Medication Sig Dispense Refill  . albuterol (PROVENTIL HFA;VENTOLIN HFA) 108 (90 BASE) MCG/ACT inhaler Inhale 2 puffs into the lungs every 6 (six) hours as needed for wheezing or shortness of breath. 1 Inhaler 2  . ALPRAZolam (XANAX) 0.25 MG tablet Take 1 tablet (0.25 mg total) by mouth at bedtime as needed for anxiety or sleep. 30 tablet 5  . amoxicillin (AMOXIL) 500 MG tablet Take 4 tablets (2,000 mg total) by mouth once. Take 4 tablets 60 minutes prior  to dental work 30 tablet 1  . apixaban (ELIQUIS) 5 MG TABS tablet Take 1 tablet (5 mg total) by mouth 2 (two) times daily. 180 tablet 3  . azelastine (ASTELIN) 0.1 % nasal spray Place 1 spray into both nostrils as needed for allergies.   5  . b complex vitamins tablet Take 1 tablet by mouth daily.    . butalbital-acetaminophen-caffeine (FIORICET, ESGIC) 50-325-40 MG tablet TAKE 1 TABLET BY MOUTH AS NEEDED FOR BACK PAIN OR HEADACHE 90 tablet 2  . Cholecalciferol (VITAMIN D) 2000 units CAPS Take 2,000 Units by mouth daily.    . Coenzyme Q10 (CO Q 10) 100 MG CAPS Take 100 mg by mouth daily.    . metoprolol tartrate (LOPRESSOR) 25 MG tablet Take 0.5 tablets (12.5 mg total) by mouth 2 (two) times daily. 90 tablet 3  . montelukast (SINGULAIR) 10 MG tablet Take 10 mg by mouth at bedtime.     . rosuvastatin (CRESTOR) 20 MG tablet TK 1 T PO QD  2  . traMADol (ULTRAM) 50 MG tablet Take 1-2 tablets (50-100 mg total) by mouth every 4 (four) hours as needed for moderate pain. 30 tablet 0  . furosemide (LASIX) 40 MG tablet Take 1 tablet (40 mg total) by mouth daily as needed for fluid. (Patient taking differently: Take 40 mg by mouth daily as needed for fluid (As needed for weight gain <2lb). ) 30 tablet 11   No facility-administered medications prior to visit.    Review of Systems   Patient denies headache, fevers, malaise, unintentional weight loss, skin rash,  eye pain, sinus congestion and sinus pain, sore throat, dysphagia,  hemoptysis , cough, dyspnea, wheezing, chest pain, palpitations, orthopnea, edema, abdominal pain, nausea, melena, diarrhea, constipation, flank pain, dysuria, hematuria, urinary  Frequency, nocturia, numbness, tingling, seizures,  Focal weakness, Loss of consciousness,  Tremor, insomnia, depression, anxiety, and suicidal ideation.     Objective:  BP 100/68 mmHg  Pulse 81  Temp(Src) 97.7 F (36.5 C) (Oral)  Resp 12  Ht 5\' 5"  (1.651 m)  Wt 138 lb 8 oz (62.823 kg)  BMI 23.05  kg/m2  SpO2 98%  LMP  (Exact Date)  Physical Exam  General appearance: alert, cooperative and appears stated age Head: Normocephalic, without obvious abnormality, atraumatic Eyes: conjunctivae/corneas clear. PERRL, EOM's intact. Fundi benign. Ears: normal TM's and external ear canals both ears Nose: Nares normal. Septum midline. Mucosa normal. No drainage or sinus tenderness. Throat: lips, mucosa, and tongue normal; teeth and gums normal Neck: no adenopathy, no carotid bruit, no JVD, supple, symmetrical, trachea midline and thyroid not enlarged, symmetric, no tenderness/mass/nodules Lungs: clear to auscultation bilaterally Breasts: normal appearance, no masses or tenderness Chest wall: pacemaker bulge in left subclavian area . No redness or skin breakdown  Heart: regular rate and rhythm, S1, S2 normal, no murmur, click, rub or gallop Abdomen: soft, non-tender; bowel sounds normal; no masses,  no organomegaly Extremities: extremities normal, atraumatic, no cyanosis or edema Pulses: 2+ and symmetric GYN: external genitalia normal. Left inguinal area with Small pustular papule surrounded by nonblanching erythema  Neurologic: Alert and oriented X 3, normal strength and tone. Normal symmetric reflexes. Normal coordination and gait.     Assessment & Plan:   Problem List Items Addressed This Visit    Osteoporosis of forearm    With prior traumatic fracture of mandible.  Completed 5 years of alendronate .  Taking prolia since 2015.  DEXA due to be repeated. Discussed the current controversies surrounding the risks and benefits of calcium supplementation.  Encouraged her to increase dietary calcium through natural foods including almond/coconut milk      Cellulitis of groin - Primary    Unclear if the localized infection is an insect bite or Staph infected boil.  Will treat empirically with Septra DS . patient instructed to use warm compresses to encourage drainage and to call if redness  spreads beyond current margins.  Probiotic use for 3 weeks advised.       Visit for preventive health examination    Annual Medicare wellness  exam was done as well as a comprehensive physical exam and management of acute and chronic conditions .  During the course of the visit the patient was educated and counseled about appropriate screening and preventive services including : fall prevention , diabetes screening, nutrition counseling, colorectal cancer screening, and recommended immunizations.  Printed recommendations for health maintenance screenings was given.        Other Visit Diagnoses    Osteoporosis        Relevant Orders    DG Bone Density    Breast cancer screening        Relevant Orders    MM DIGITAL SCREENING BILATERAL       I am having Ms. Angiolillo start on sulfamethoxazole-trimethoprim. I am also having her maintain her montelukast, albuterol, ALPRAZolam, azelastine, traMADol, amoxicillin, metoprolol tartrate, butalbital-acetaminophen-caffeine, Vitamin D, Co Q 10, b complex vitamins, apixaban, and rosuvastatin.  Meds ordered this encounter  Medications  . sulfamethoxazole-trimethoprim (BACTRIM DS,SEPTRA DS) 800-160 MG tablet    Sig: Take 1  tablet by mouth 2 (two) times daily.    Dispense:  14 tablet    Refill:  0    There are no discontinued medications.  Follow-up: No Follow-up on file.   [name], MD

## 2016-05-23 NOTE — Patient Instructions (Signed)
Medication Instructions:  Your physician recommends that you continue on your current medications as directed. Please refer to the Current Medication list given to you today.  Labwork: None ordered  Testing/Procedures: None ordered  Follow-Up: Remote monitoring is used to monitor your Pacemaker of ICD from home. This monitoring reduces the number of office visits required to check your device to one time per year. It allows [place] to keep an eye on the functioning of your device to ensure it is working properly. You are scheduled for a device check from home on 08/22/2016. You may send your transmission at any time that day. If you have a wireless device, the transmission will be sent automatically. After your physician reviews your transmission, you will receive a postcard with your next transmission date.  Your physician wants you to follow-up in: 1 year with Dr. Lovena Le. You will receive a reminder letter in the mail two months in advance. If you don't receive a letter, please call our office to schedule the follow-up appointment.  If you need a refill on your cardiac medications before your next appointment, please call your pharmacy.  Thank you for choosing [facility]!!     Any Other Special Instructions Will Be Listed Below (If Applicable).

## 2016-05-24 NOTE — Telephone Encounter (Signed)
Patient prefers to pick up H&P placed at front desk patient aware copy for chart.

## 2016-05-27 NOTE — Telephone Encounter (Signed)
Amended note from Dr. Acie Fredrickson placed at front desk per patient's request.  She will have repeat lab work on 7/11.

## 2016-05-29 ENCOUNTER — Encounter: Payer: Self-pay | Admitting: Pulmonary Disease

## 2016-05-29 ENCOUNTER — Encounter: Payer: Self-pay | Admitting: Internal Medicine

## 2016-06-11 ENCOUNTER — Ambulatory Visit (INDEPENDENT_AMBULATORY_CARE_PROVIDER_SITE_OTHER): Payer: Medicare Other

## 2016-06-11 ENCOUNTER — Other Ambulatory Visit (INDEPENDENT_AMBULATORY_CARE_PROVIDER_SITE_OTHER): Payer: Medicare Other

## 2016-06-11 DIAGNOSIS — Z79899 Other long term (current) drug therapy: Secondary | ICD-10-CM

## 2016-06-11 DIAGNOSIS — K764 Peliosis hepatis: Secondary | ICD-10-CM

## 2016-06-11 DIAGNOSIS — Z23 Encounter for immunization: Secondary | ICD-10-CM | POA: Diagnosis not present

## 2016-06-11 DIAGNOSIS — E785 Hyperlipidemia, unspecified: Secondary | ICD-10-CM | POA: Diagnosis not present

## 2016-06-11 LAB — COMPREHENSIVE METABOLIC PANEL
ALT: 18 U/L (ref 0–35)
AST: 24 U/L (ref 0–37)
Albumin: 4.3 g/dL (ref 3.5–5.2)
Alkaline Phosphatase: 59 U/L (ref 39–117)
BUN: 25 mg/dL — ABNORMAL HIGH (ref 6–23)
CO2: 29 mEq/L (ref 19–32)
Calcium: 9.4 mg/dL (ref 8.4–10.5)
Chloride: 105 mEq/L (ref 96–112)
Creatinine, Ser: 0.91 mg/dL (ref 0.40–1.20)
GFR: 63.94 mL/min (ref >60.00)
Glucose, Bld: 87 mg/dL (ref 70–99)
Potassium: 3.8 mEq/L (ref 3.5–5.1)
Sodium: 140 mEq/L (ref 135–145)
Total Bilirubin: 0.5 mg/dL (ref 0.2–1.2)
Total Protein: 7 g/dL (ref 6.0–8.3)

## 2016-06-11 LAB — LIPID PANEL
Cholesterol: 167 mg/dL (ref 0–200)
HDL: 62.6 mg/dL (ref >39.00)
LDL Cholesterol: 91 mg/dL (ref 0–99)
NonHDL: 104.09
Total CHOL/HDL Ratio: 3
Triglycerides: 66 mg/dL (ref 0.0–149.0)
VLDL: 13.2 mg/dL (ref 0.0–40.0)

## 2016-06-11 NOTE — Progress Notes (Signed)
Patient was in today getting her second Hep A/B injection in her right deltoid. patient tolerated well.

## 2016-06-14 ENCOUNTER — Encounter: Payer: Self-pay | Admitting: Internal Medicine

## 2016-06-17 ENCOUNTER — Other Ambulatory Visit: Payer: Self-pay | Admitting: Internal Medicine

## 2016-06-17 MED ORDER — ROSUVASTATIN CALCIUM 40 MG PO TABS: 40.0000 mg | ORAL_TABLET | Freq: Every day | ORAL | Status: DC

## 2016-06-20 ENCOUNTER — Ambulatory Visit: Payer: Medicare Other | Admitting: Psychology

## 2016-07-02 ENCOUNTER — Ambulatory Visit
Admission: RE | Admit: 2016-07-02 | Discharge: 2016-07-02 | Disposition: A | Discharge status: Home / Self Care | Payer: Medicare Other | Source: Ambulatory Visit | Attending: Internal Medicine | Admitting: Internal Medicine

## 2016-07-02 ENCOUNTER — Other Ambulatory Visit: Payer: Self-pay | Admitting: Internal Medicine

## 2016-07-02 DIAGNOSIS — Z1231 Encounter for screening mammogram for malignant neoplasm of breast: Secondary | ICD-10-CM

## 2016-07-02 DIAGNOSIS — I4891 Unspecified atrial fibrillation: Secondary | ICD-10-CM | POA: Diagnosis not present

## 2016-07-02 DIAGNOSIS — Z780 Asymptomatic menopausal state: Secondary | ICD-10-CM | POA: Insufficient documentation

## 2016-07-02 DIAGNOSIS — Z1239 Encounter for other screening for malignant neoplasm of breast: Secondary | ICD-10-CM

## 2016-07-02 DIAGNOSIS — M810 Age-related osteoporosis without current pathological fracture: Secondary | ICD-10-CM | POA: Diagnosis not present

## 2016-07-05 ENCOUNTER — Encounter: Payer: Self-pay | Admitting: Internal Medicine

## 2016-07-08 ENCOUNTER — Telehealth: Payer: Self-pay | Admitting: Internal Medicine

## 2016-07-08 DIAGNOSIS — E559 Vitamin D deficiency, unspecified: Secondary | ICD-10-CM

## 2016-07-08 DIAGNOSIS — R7303 Prediabetes: Secondary | ICD-10-CM

## 2016-07-08 DIAGNOSIS — Z79899 Other long term (current) drug therapy: Secondary | ICD-10-CM

## 2016-07-08 DIAGNOSIS — E785 Hyperlipidemia, unspecified: Secondary | ICD-10-CM

## 2016-07-08 NOTE — Telephone Encounter (Signed)
Pt would like to get fasting labs before her appt on 09/18. Need orders please and thank you!  Call pt @ [phone].

## 2016-07-09 NOTE — Telephone Encounter (Signed)
Last labs completed on 7/11 (lipid, CMP) has upcoming appt on 11/12/16.  Please advise and order if needed. thanks

## 2016-07-11 NOTE — Telephone Encounter (Signed)
Fasting labs ordered

## 2016-07-11 NOTE — Telephone Encounter (Signed)
Order in, can schedule, thanks

## 2016-08-02 ENCOUNTER — Encounter: Payer: Self-pay | Admitting: Cardiovascular Disease

## 2016-08-12 ENCOUNTER — Other Ambulatory Visit (INDEPENDENT_AMBULATORY_CARE_PROVIDER_SITE_OTHER): Payer: Medicare Other

## 2016-08-12 DIAGNOSIS — E785 Hyperlipidemia, unspecified: Secondary | ICD-10-CM

## 2016-08-12 DIAGNOSIS — E559 Vitamin D deficiency, unspecified: Secondary | ICD-10-CM | POA: Diagnosis not present

## 2016-08-12 DIAGNOSIS — R7303 Prediabetes: Secondary | ICD-10-CM | POA: Diagnosis not present

## 2016-08-12 DIAGNOSIS — Z79899 Other long term (current) drug therapy: Secondary | ICD-10-CM

## 2016-08-12 LAB — VITAMIN D 25 HYDROXY (VIT D DEFICIENCY, FRACTURES): VITD: 55.88 ng/mL (ref 30.00–100.00)

## 2016-08-12 LAB — COMPREHENSIVE METABOLIC PANEL
ALT: 21 U/L (ref 0–35)
AST: 27 U/L (ref 0–37)
Albumin: 4.2 g/dL (ref 3.5–5.2)
Alkaline Phosphatase: 51 U/L (ref 39–117)
BUN: 27 mg/dL — ABNORMAL HIGH (ref 6–23)
CO2: 28 mEq/L (ref 19–32)
Calcium: 9 mg/dL (ref 8.4–10.5)
Chloride: 102 mEq/L (ref 96–112)
Creatinine, Ser: 0.81 mg/dL (ref 0.40–1.20)
GFR: 73.1 mL/min (ref >60.00)
Glucose, Bld: 86 mg/dL (ref 70–99)
Potassium: 3.9 mEq/L (ref 3.5–5.1)
Sodium: 137 mEq/L (ref 135–145)
Total Bilirubin: 0.6 mg/dL (ref 0.2–1.2)
Total Protein: 6.5 g/dL (ref 6.0–8.3)

## 2016-08-12 LAB — CBC WITH DIFFERENTIAL/PLATELET
Basophils Absolute: 0 10*3/uL (ref 0.0–0.1)
Basophils Relative: 0.7 % (ref 0.0–3.0)
Eosinophils Absolute: 0.1 10*3/uL (ref 0.0–0.7)
Eosinophils Relative: 1.5 % (ref 0.0–5.0)
HCT: 33.4 % — ABNORMAL LOW (ref 36.0–46.0)
Hemoglobin: 11.3 g/dL — ABNORMAL LOW (ref 12.0–15.0)
Lymphocytes Relative: 29 % (ref 12.0–46.0)
Lymphs Abs: 1.5 10*3/uL (ref 0.7–4.0)
MCHC: 33.8 g/dL (ref 30.0–36.0)
MCV: 89.1 fl (ref 78.0–100.0)
Monocytes Absolute: 0.3 10*3/uL (ref 0.1–1.0)
Monocytes Relative: 6.8 % (ref 3.0–12.0)
Neutro Abs: 3.1 10*3/uL (ref 1.4–7.7)
Neutrophils Relative %: 62 % (ref 43.0–77.0)
Platelets: 104 10*3/uL — ABNORMAL LOW (ref 150.0–400.0)
RBC: 3.75 Mil/uL — ABNORMAL LOW (ref 3.87–5.11)
RDW: 13.9 % (ref 11.5–15.5)
WBC: 5 10*3/uL (ref 4.0–10.5)

## 2016-08-12 LAB — LIPID PANEL
Cholesterol: 143 mg/dL (ref 0–200)
HDL: 56 mg/dL (ref >39.00)
LDL Cholesterol: 75 mg/dL (ref 0–99)
NonHDL: 86.68
Total CHOL/HDL Ratio: 3
Triglycerides: 58 mg/dL (ref 0.0–149.0)
VLDL: 11.6 mg/dL (ref 0.0–40.0)

## 2016-08-12 LAB — HEMOGLOBIN A1C: Hgb A1c MFr Bld: 5.6 % (ref 4.6–6.5)

## 2016-08-13 ENCOUNTER — Other Ambulatory Visit: Payer: Self-pay

## 2016-08-13 MED ORDER — ROSUVASTATIN CALCIUM 40 MG PO TABS: 40.0000 mg | ORAL_TABLET | Freq: Every day | ORAL | 1 refills | Status: DC

## 2016-08-13 NOTE — Telephone Encounter (Signed)
Notified patient about new Rx of 90 day supply was cheaper on her. She understood and was thankful for the new rx

## 2016-08-15 ENCOUNTER — Encounter: Payer: Self-pay | Admitting: Internal Medicine

## 2016-08-19 ENCOUNTER — Ambulatory Visit (INDEPENDENT_AMBULATORY_CARE_PROVIDER_SITE_OTHER): Payer: Medicare Other | Admitting: Internal Medicine

## 2016-08-19 ENCOUNTER — Ambulatory Visit (INDEPENDENT_AMBULATORY_CARE_PROVIDER_SITE_OTHER): Payer: Medicare Other | Admitting: Cardiovascular Disease

## 2016-08-19 ENCOUNTER — Encounter: Payer: Self-pay | Admitting: Internal Medicine

## 2016-08-19 ENCOUNTER — Encounter: Payer: Self-pay | Admitting: Cardiovascular Disease

## 2016-08-19 VITALS — BP 94/60 | HR 84 | Ht 65.0 in | Wt 136.8 lb

## 2016-08-19 DIAGNOSIS — Z23 Encounter for immunization: Secondary | ICD-10-CM | POA: Diagnosis not present

## 2016-08-19 DIAGNOSIS — E785 Hyperlipidemia, unspecified: Secondary | ICD-10-CM | POA: Diagnosis not present

## 2016-08-19 DIAGNOSIS — Z9889 Other specified postprocedural states: Secondary | ICD-10-CM | POA: Diagnosis not present

## 2016-08-19 DIAGNOSIS — M810 Age-related osteoporosis without current pathological fracture: Secondary | ICD-10-CM

## 2016-08-19 DIAGNOSIS — R7303 Prediabetes: Secondary | ICD-10-CM

## 2016-08-19 DIAGNOSIS — Z950 Presence of cardiac pacemaker: Secondary | ICD-10-CM | POA: Diagnosis not present

## 2016-08-19 DIAGNOSIS — I341 Nonrheumatic mitral (valve) prolapse: Secondary | ICD-10-CM | POA: Diagnosis not present

## 2016-08-19 DIAGNOSIS — L03031 Cellulitis of right toe: Secondary | ICD-10-CM

## 2016-08-19 MED ORDER — CEPHALEXIN 500 MG PO CAPS: 500.0000 mg | ORAL_CAPSULE | Freq: Four times a day (QID) | ORAL | 0 refills | Status: DC

## 2016-08-19 NOTE — Progress Notes (Signed)
Pre visit review using our clinic review tool, if applicable. No additional management support is needed unless otherwise documented below in the visit note. 

## 2016-08-19 NOTE — Patient Instructions (Signed)

## 2016-08-19 NOTE — Patient Instructions (Addendum)
Ask Dr Acie Fredrickson about which document would cover the  pacemaker issue (Living will vs DNR)  Increase your water intake to 3 16 ounce bottles of water  Turmeric once daily in the evening for leg cramps   I think your LDL  Is close enough to 70 !  You miht be able to lower it to < 70 by avoiding animal products (except fish)   Lab Results  Component Value Date   HGBA1C 5.6 08/12/2016   .Congrats on the A1c!   I am treating you for paronychia of the toe.  Keflex 500 mg 4 times daily for one week ,  Continue soaking your toe in Epsom salts once daily  Please take a probiotic ( Align, Floraque or Culturelle) of the generic version of one of these  For a minimum of 3 weeks to prevent a serious antibiotic associated diarrhea  Called clostridium dificile colitis

## 2016-08-19 NOTE — Progress Notes (Signed)
[name] [name] Date of Birth  [birth year]       [facility] Office [address] [place], [place], [place]  [address]   [place], [place]  [address] [phone]     [phone]   Fax  [phone]    Fax [phone]  Problem List: 1. Supraventricular tachycardia 2. Mitral valve prolapse - s/p mitral valve replacement  3. Cataracts 4.     [name] [name] is a  75 yo with hx of SVT and MVP.   She has done well.  She has not been exercising as much as she would like.  She has not had any syncope or presyncope.  She has been taking care of her granddaughter who has ADD and a sleep disorder.  [name] [name] did not get much sleep that weekend and had lots of palpitations for the following week.  Oct. 7, 2014  [name] [name] is seen after a 1 1/2 year absence.  She has had some fatigue.   Dr. Derrel Nip has found thrombocytopenia.  Crestor was stopped but this did not help the thrombocytopenia.   She has seen a hemotologist.    She fell  (caught her toe at a [employer] - not syncope) several months ago and had reconstruction of her jaw.  She also broke her left wrist.     Oct. 2, 2015 :  [name] [name] is doing well from a cardiac standpoint.  Her cholesterol levels are slightly elevated ( even on Crestor 5 mg a day).  She has had marginally low platelets .  Her cholesterol levels are still minimally elevated.    She had some dyspnea this summer.  Currently , she has no limitations.  She thinks the symptoms may have been due to allergies.  No chest pain.      She thinks that her stamina is not as good as it should be.  She does yoga but no cardio workouts.    Sept 6, 2016:  [name] [name] is seen today for a recent episode of paroxysmal atrial fib August 21 was shopping , felt very weak .   Could not walk to her car.  Very short of breath .  She went to [place] a day or so later.  Gradually had worsening chest tightness, allergy, weakness.  Thought it was allergies.    Had significant leg swelling .   She went to the hospital. She was found have rapid atrial fibrillation with evidence of congestive heart failure. Her echocardiogram showed normal left ventricle systolic function. She has mild mitral regurgitation. Was significant calcification of the mitral valve annulus. She was started on Lovenox. She's not received Coumadin at this point.  She was started on diltiazem and Lasix.  Was started on Lovenox ( because of the atrial fib in the setting of MR and itral valve calcification .   She seems to be doing quite a lot at her. She's been going to doctors appointments and has been walking across the parking lot  without any CP with problems.  Sept. 13, 2016:  [name] [name] is seen back for a 1 week visit. She was found to have rapid atrial fib last week Had some dyspnea last night - better with a Xopenex sample inhaler.  Feels better on the Eliquis and Metoprolol .  Feels more relaxed.  Cannot tell if she is in atrial fib Has low energy .  Difficult to walk to the mailbox without extreme fatigue ,  not specifically short of breath   Oct. 11, 2016:  Was cardioverted several weeks ago.  Feeling better every day since that   Nov. 10, 2016:  Has done well.  Steady improvement since  the cardioversion.  Then for the past several days/ weeks has not been doing quite a good Has shortness of breath with showering and doing everyday activities.    Feb. 10, 2017:  Is doing well - has had Mitral valve replacement and MAZE procedure since I last saw her  Just this past week, she felt much better.   Has been fatigued but now has more energetic . Has gain  Apr 16, 2016:  [name] [name] has had an atrial pacer lead revision since I last saw her. Feels "a bit off since last Thursday " Was bending over to prune some bushes.  Has not felt right since that time  Goes to the gym Geophysicist/field seismologist)  Feels "odd" when she bends over.  Has been anxious recently   Sept. 18,  2017:  [name] [name] is seen today for follow up visit Doing well. No cardiac complaints.      Current Outpatient Prescriptions on File Prior to Visit  Medication Sig Dispense Refill  . albuterol (PROVENTIL HFA;VENTOLIN HFA) 108 (90 BASE) MCG/ACT inhaler Inhale 2 puffs into the lungs every 6 (six) hours as needed for wheezing or shortness of breath. 1 Inhaler 2  . ALPRAZolam (XANAX) 0.25 MG tablet Take 1 tablet (0.25 mg total) by mouth at bedtime as needed for anxiety or sleep. 30 tablet 5  . amoxicillin (AMOXIL) 500 MG tablet Take 4 tablets (2,000 mg total) by mouth once. Take 4 tablets 60 minutes prior to dental work 30 tablet 1  . apixaban (ELIQUIS) 5 MG TABS tablet Take 1 tablet (5 mg total) by mouth 2 (two) times daily. 180 tablet 3  . azelastine (ASTELIN) 0.1 % nasal spray Place 1 spray into both nostrils as needed for allergies.   5  . b complex vitamins tablet Take 1 tablet by mouth daily.    . butalbital-acetaminophen-caffeine (FIORICET, ESGIC) 50-325-40 MG tablet TAKE 1 TABLET BY MOUTH AS NEEDED FOR BACK PAIN OR HEADACHE 90 tablet 2  . cephALEXin (KEFLEX) 500 MG capsule Take 1 capsule (500 mg total) by mouth 4 (four) times daily. 28 capsule 0  . Cholecalciferol (VITAMIN D) 2000 units CAPS Take 2,000 Units by mouth daily.    . Coenzyme Q10 (CO Q 10) 100 MG CAPS Take 100 mg by mouth daily.    . furosemide (LASIX) 40 MG tablet Take 40 mg by mouth as directed.    . metoprolol tartrate (LOPRESSOR) 25 MG tablet Take 0.5 tablets (12.5 mg total) by mouth 2 (two) times daily. 90 tablet 3  . montelukast (SINGULAIR) 10 MG tablet Take 10 mg by mouth at bedtime.     . rosuvastatin (CRESTOR) 40 MG tablet Take 1 tablet (40 mg total) by mouth daily. 90 tablet 1  . traMADol (ULTRAM) 50 MG tablet Take 1-2 tablets (50-100 mg total) by mouth every 4 (four) hours as needed for moderate pain. 30 tablet 0   No current facility-administered medications on file prior to visit.     Allergies  Allergen  Reactions  . Meloxicam Other (See Comments)    Severe reflux  . Codeine Nausea And Vomiting    migraine  . Doxycycline Itching and Swelling    Facial  . Hydrocodone Nausea And Vomiting    MIGRAINE  . Hydromorphone Nausea And  Vomiting    migraine  . Oxycodone Nausea And Vomiting    Severe migraine    Past Medical History:  Diagnosis Date  . Acute on chronic diastolic (congestive) heart failure ([place])   . Anxiety associated with depression    Prn alprazolam   . Atrial fibrillation, persistent ([place])    DCCV 08/22/2015  . Bradycardia post-op bradycardia, pacer dependent   MDT PPM 11/06/15, Dr. Lovena Le  . Diverticulosis   . Hemorrhoid   . Hepatic cyst    innumerable  . 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 Hyperlipidemia   . IBS (irritable bowel syndrome)   . Idiopathic thrombocytopenic purpura (ITP)   . Migraine   . MVP (mitral valve prolapse)   . Nodule of right lung   . RBBB   . Restrictive lung disease    Mild on PFT & likely cardiac in etiology   . S/P Minimally invasive maze operation for atrial fibrillation 10/31/2015   Complete bilateral atrial lesion set using cryothermy and bipolar radiofrequency ablation with clipping of LA appendage via right mini thoracotomy approach  . 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 Severe mitral regurgitation   . SVT (supraventricular tachycardia) ([place])   . Thoracic aorta atherosclerosis [facility])     Past Surgical History:  Procedure Laterality Date  . BREAST BIOPSY Right Late 80s   Negative  . CARDIAC CATHETERIZATION N/A 10/18/2015   Procedure: Right/Left Heart Cath and Coronary Angiography;  Surgeon: Sherren Mocha, MD;  Location: [place] CV LAB;  Service: Cardiovascular;  Laterality: N/A;  . CARDIOVERSION N/A 08/22/2015   Procedure: CARDIOVERSION;  Surgeon: Thayer Headings, MD;   Location: [place];  Service: Cardiovascular;  Laterality: N/A;  . COLONOSCOPY  2003  . EP IMPLANTABLE DEVICE N/A 11/06/2015   Procedure: Pacemaker Implant;  Surgeon: Evans Lance, MD;  Location: [place] CV LAB;  Service: Cardiovascular;  Laterality: N/A;  . EP IMPLANTABLE DEVICE N/A 02/20/2016   Procedure: Lead Extraction;  Surgeon: Evans Lance, MD;  Location: [place] CV LAB;  Service: Cardiovascular;  Laterality: N/A;  . EYE SURGERY Right 2013  . FOOT SURGERY    . MANDIBLE FRACTURE SURGERY  03-26-13  . MINIMALLY INVASIVE MAZE PROCEDURE N/A 10/31/2015   Procedure: MINIMALLY INVASIVE MAZE PROCEDURE;  Surgeon: Rexene Alberts, MD;  Location: [place];  Service: Open Heart Surgery;  Laterality: N/A;  . MITRAL VALVE REPLACEMENT Right 10/31/2015   Procedure: MINIMALLY INVASIVE MITRAL VALVE (MV) REPLACEMENT;  Surgeon: Rexene Alberts, MD;  Location: [place];  Service: Open Heart Surgery;  Laterality: Right;  . PACEMAKER LEAD REMOVAL  02/20/2016  . TEE WITH CARDIOVERSION    . TEE WITHOUT CARDIOVERSION N/A 08/22/2015   Procedure: TRANSESOPHAGEAL ECHOCARDIOGRAM (TEE);  Surgeon: Thayer Headings, MD;  Location: [place];  Service: Cardiovascular;  Laterality: N/A;  . TEE WITHOUT CARDIOVERSION N/A 10/31/2015   Procedure: TRANSESOPHAGEAL ECHOCARDIOGRAM (TEE);  Surgeon: Rexene Alberts, MD;  Location: [place];  Service: Open Heart Surgery;  Laterality: N/A;  . TUBAL LIGATION    . VARICOSE VEIN SURGERY Right     History  Smoking Status  . Never Smoker  Smokeless Tobacco  . Never Used    History  Alcohol Use No    Family History  Problem Relation Age of Onset  . Hypertension Mother   . Arrhythmia Mother   .  Heart failure Mother   . Arrhythmia Brother   . Stroke Brother 28    cerebral hemorrhage, nonsmoker, no HTN  . Prostate cancer Brother   . Stroke Father     from an aneurysm  . Stroke Maternal Aunt 83    cerebral hemorrhage  . Liver cancer Maternal Grandmother   . Atrial  fibrillation Son   . Celiac disease Son   . Heart attack Neg Hx   . Breast cancer Neg Hx     Reviw of Systems:  Reviewed in the HPI.  All other systems are negative.  Physical Exam: Blood pressure 94/60, pulse 84, height 5\' 5"  (1.651 m), weight 136 lb 12.8 oz (62.1 kg). General: Well developed, well nourished, in no acute distress. Head: Normocephalic, atraumatic, sclera non-icteric, mucus membranes are moist,  Neck: Supple. Carotids are 2 + without bruits. No JVD Lungs: Clear bilaterally to auscultation. Heart:  RR with occasional premature beats , chest is non tender  Abdomen: Soft, non-tender, non-distended with normal bowel sounds. No hepatomegaly. No rebound/guarding. No masses. Msk:  Strength and tone are normal Extremities: No clubbing or cyanosis. No edema.  Distal pedal pulses are 2+ and equal bilaterally. Neuro: Alert and oriented X 3. Moves all extremities spontaneously. Psych:  Responds to questions appropriately with a normal affect.  ECG: AV pacing at 74.    Assessment / Plan:   1. Persistent Atrial fibrillation with rapid ventricular response: she is maintaining NSR.  She status post MAZE procedure . probalby continue coumadin for a total of 3 months after surgery . Pacemaker interrogation reveals that she has not had any mode switches or any recent episodes of atrial fibrillation. She has an upcoming appointment in the atrial fib clinic. We will cancel appointment for now.  2.  MVP with MR:  Now status post mitral valve replacement. She is doing quite well. She is getting over anesthesia and her energy levels have improved.  3. Congestive heart failure: her CHF symptoms have greatly improved.  4. Pacemaker: She status post pacemaker implantation. Has not had any recent mode switches.  5. Hyperlipidemia:   Lipids look great.    LDL is 75.  She may cut out some read meat. She is on Co enzyme Q 10 and her leg cramps are much better.   Mertie Moores, MD   08/19/2016 2:21 PM    [address],  [place], [place]  [address] Pager [phone] Phone: [phone]; Fax: [phone]

## 2016-08-19 NOTE — Progress Notes (Signed)
Subjective:  Patient ID: [name] [name], female    DOB: 1941-10-31  Age: 75 y.o. MRN: [id number]  CC:  follow up on chronic conditions and new issues.   Her 3rd toe on right foot has been "sore"  since June . Toe was initially red and cuticle was swollen on the right side,  So she has been self treating with daily soaks in epsom salts and topical antibiotics,  The redness and swelling has improved but the pain remains.  Does not want ot see podiatry for fear of toenail removal. .   Discussed treating for keflex   She has brought copies of her Living will done in 2009. She is concerned that there is no provision for turning off her pacemaker at the appropriate time .  She does not have or want a DNR order at this time.    Discussed labs.  Worried about LDL of 74  Not at goal of < 70 on maximal dose of  Crestor 40 mg .  The CoQ10 is helping with the cramping but still gets a leg cramp every morning around 5 am .  Water intake is low.   She follows a sodium restricted diet of 1500 mg . For EF 35 to 40% by April 2017 ECHO which noted  moderate diffuse hhypokineses. Marland Kitchen  Her weight increased by 3 lbs overnight after eating at [employer],  But otherwise has been stable .  Using 1/2 tablet lasix prn weigh gain of 2 lbs overnight     Walking for 50 minutes 3 days per week (30 minutes on treeadmill, 20 on elliptical)  With some dyspnea but no chest pain or dizziness.  Sh eis due for Prolia injection after Oct 19th.  Last DEXA reviewed.   HPI [name] [name] presents for follow up on atrial fib with history of embolic CVA   osteoporosis with history of mandible fracture, on Prolia last DEX aug 2017 forearm -3.1 everywhere -1.4     Outpatient Medications Prior to Visit  Medication Sig Dispense Refill  . albuterol (PROVENTIL HFA;VENTOLIN HFA) 108 (90 BASE) MCG/ACT inhaler Inhale 2 puffs into the lungs every 6 (six) hours as needed for wheezing or shortness of breath. 1 Inhaler 2  . ALPRAZolam  (XANAX) 0.25 MG tablet Take 1 tablet (0.25 mg total) by mouth at bedtime as needed for anxiety or sleep. 30 tablet 5  . amoxicillin (AMOXIL) 500 MG tablet Take 4 tablets (2,000 mg total) by mouth once. Take 4 tablets 60 minutes prior to dental work 30 tablet 1  . apixaban (ELIQUIS) 5 MG TABS tablet Take 1 tablet (5 mg total) by mouth 2 (two) times daily. 180 tablet 3  . azelastine (ASTELIN) 0.1 % nasal spray Place 1 spray into both nostrils as needed for allergies.   5  . b complex vitamins tablet Take 1 tablet by mouth daily.    . butalbital-acetaminophen-caffeine (FIORICET, ESGIC) 50-325-40 MG tablet TAKE 1 TABLET BY MOUTH AS NEEDED FOR BACK PAIN OR HEADACHE 90 tablet 2  . Cholecalciferol (VITAMIN D) 2000 units CAPS Take 2,000 Units by mouth daily.    . Coenzyme Q10 (CO Q 10) 100 MG CAPS Take 100 mg by mouth daily.    . furosemide (LASIX) 40 MG tablet Take 40 mg by mouth as directed.    . metoprolol tartrate (LOPRESSOR) 25 MG tablet Take 0.5 tablets (12.5 mg total) by mouth 2 (two) times daily. 90 tablet 3  . montelukast (SINGULAIR) 10 MG  tablet Take 10 mg by mouth at bedtime.     . rosuvastatin (CRESTOR) 40 MG tablet Take 1 tablet (40 mg total) by mouth daily. 90 tablet 1  . traMADol (ULTRAM) 50 MG tablet Take 1-2 tablets (50-100 mg total) by mouth every 4 (four) hours as needed for moderate pain. 30 tablet 0  . sulfamethoxazole-trimethoprim (BACTRIM DS,SEPTRA DS) 800-160 MG tablet Take 1 tablet by mouth 2 (two) times daily. 14 tablet 0   No facility-administered medications prior to visit.     Review of Systems;  Patient denies headache, fevers, malaise, unintentional weight loss, skin rash, eye pain, sinus congestion and sinus pain, sore throat, dysphagia,  hemoptysis , cough, dyspnea, wheezing, chest pain, palpitations, orthopnea, edema, abdominal pain, nausea, melena, diarrhea, constipation, flank pain, dysuria, hematuria, urinary  Frequency, nocturia, numbness, tingling, seizures,  Focal  weakness, Loss of consciousness,  Tremor, insomnia, depression, anxiety, and suicidal ideation.      Objective:  BP 100/68   Pulse 86   Temp 98 F (36.7 C) (Oral)   Ht 5\' 5"  (1.651 m)   Wt 135 lb 6 oz (61.4 kg)   LMP  (Exact Date)   SpO2 94%   BMI 22.53 kg/m   BP Readings from Last 3 Encounters:  08/19/16 94/60  08/19/16 100/68  05/23/16 102/74    Wt Readings from Last 3 Encounters:  08/19/16 136 lb 12.8 oz (62.1 kg)  08/19/16 135 lb 6 oz (61.4 kg)  05/23/16 139 lb (63 kg)    General appearance: alert, cooperative and appears stated age Ears: normal TM's and external ear canals both ears Throat: lips, mucosa, and tongue normal; teeth and gums normal Neck: no adenopathy, no carotid bruit, supple, symmetrical, trachea midline and thyroid not enlarged, symmetric, no tenderness/mass/nodules Back: symmetric, no curvature. ROM normal. No CVA tenderness. Lungs: clear to auscultation bilaterally Heart: regular rate and rhythm, S1, S2 normal, no murmur, click, rub or gallop Abdomen: soft, non-tender; bowel sounds normal; no masses,  no organomegaly Pulses: 2+ and symmetric Skin: Skin color, texture, turgor normal. No rashes or lesions. Right 3rd toe with miniaml erythema and cuticle swelling but tender to palpation. Lymph nodes: Cervical, supraclavicular, and axillary nodes normal.  Lab Results  Component Value Date   HGBA1C 5.6 08/12/2016   HGBA1C 6.0 (H) 05/04/2016   HGBA1C 6.0 02/15/2016    Lab Results  Component Value Date   CREATININE 0.81 08/12/2016   CREATININE 0.91 06/11/2016   CREATININE 0.78 05/22/2016    Lab Results  Component Value Date   WBC 5.0 08/12/2016   HGB 11.3 (L) 08/12/2016   HCT 33.4 (L) 08/12/2016   PLT 104.0 (L) 08/12/2016   GLUCOSE 86 08/12/2016   CHOL 143 08/12/2016   TRIG 58.0 08/12/2016   HDL 56.00 08/12/2016   LDLDIRECT 113.0 02/15/2016   LDLCALC 75 08/12/2016   ALT 21 08/12/2016   AST 27 08/12/2016   NA 137 08/12/2016   K 3.9  08/12/2016   CL 102 08/12/2016   CREATININE 0.81 08/12/2016   BUN 27 (H) 08/12/2016   CO2 28 08/12/2016   TSH 2.12 08/01/2014   INR 1.29 05/03/2016   HGBA1C 5.6 08/12/2016    Dg Bone Density  Result Date: 07/02/2016 EXAM: DUAL X-RAY ABSORPTIOMETRY (DXA) FOR BONE MINERAL DENSITY IMPRESSION: Dear Dr. Derrel Nip, Your patient Doyne Breceda completed a BMD test on 07/02/2016 using the [place] (analysis version: 14.10) manufactured by [employer]. The following summarizes the results of our evaluation. PATIENT BIOGRAPHICAL:  Name: Alaetra, Tiefenthaler Patient ID: [id number] Birth Date: [birth year] Height: 65.5 in. Gender: Female Exam Date: 07/02/2016 Weight: 136.0 lbs. Indications: A. Fib, Caucasian, Height Loss, Postmenopausal Fractures:  Left wrist, MANDIBLE Treatments: Vit D ASSESSMENT: The BMD measured at Forearm Radius 33% is 0.603 g/cm2 with a T-score of -3.1. This patient is considered OSTEOPOROTIC according to [facility]) criteria. Lumbar spine was not utilized due to  scoliosis. Site Region Measured Measured WHO Young Adult BMD Date       Age      Classification T-score DualFemur Neck Right 07/02/2016 75.7 Osteopenia -1.4 0.847 g/cm2 DualFemur Neck Right 08/12/2012 71.8 Osteopenia -1.4 0.838 g/cm2 DualFemur Neck Right 07/11/2010 69.7 Osteopenia -1.5 0.834 g/cm2 Right Forearm Radius 33% 07/02/2016 75.7 Osteoporosis -3.1 0.603 g/cm2 Right Forearm Radius 33% 02/08/2014 73.3 Osteoporosis -2.6 0.648 g/cm2 World Health Organization [facility]) criteria for post-menopausal, Caucasian Women: Normal:       T-score at or above -1 SD Osteopenia:   T-score between -1 and -2.5 SD Osteoporosis: T-score at or below -2.5 SD RECOMMENDATIONS: [facility] recommends that FDA-approved medical therapies be considered in postmenopausal women and men age 34 or older with a: 1. Hip or vertebral (clinical or morphometric) fracture. 2. T-score of < -2.5 at the spine or hip. 3. Ten-year  fracture probability by FRAX of 3% or greater for hip fracture or 20% or greater for major osteoporotic fracture. All treatment decisions require clinical judgment and consideration of individual patient factors, including patient preferences, co-morbidities, previous drug use, risk factors not captured in the FRAX model (e.g. falls, vitamin D deficiency, increased bone turnover, interval significant decline in bone density) and possible under - or over-estimation of fracture risk by FRAX. All patients should ensure an adequate intake of dietary calcium (1200 mg/d) and vitamin D (800 IU daily) unless contraindicated. FOLLOW-UP: People with diagnosed cases of osteoporosis or at high risk for fracture should have regular bone mineral density tests. For patients eligible for Medicare, routine testing is allowed once every 2 years. The testing frequency can be increased to one year for patients who have rapidly progressing disease, those who are receiving or discontinuing medical therapy to restore bone mass, or have additional risk factors. I have reviewed this report, and agree with the above findings. Mark A. Thornton Papas, M.D. [facility] Electronically Signed   By: Lavonia Dana M.D.   On: 07/02/2016 13:53   Mm Screening Breast Tomo Bilateral  Result Date: 07/03/2016 CLINICAL DATA:  Screening. EXAM: 2D DIGITAL SCREENING BILATERAL MAMMOGRAM WITH CAD AND ADJUNCT TOMO COMPARISON:  Previous exam(s). ACR Breast Density Category b: There are scattered areas of fibroglandular density. FINDINGS: There are no findings suspicious for malignancy. Images were processed with CAD. IMPRESSION: No mammographic evidence of malignancy. A result letter of this screening mammogram will be mailed directly to the patient. RECOMMENDATION: Screening mammogram in one year. (Code:SM-B-01Y) BI-RADS CATEGORY  1: Negative. Electronically Signed   By: Pamelia Hoit M.D.   On: 07/03/2016 08:02    Assessment & Plan:   Problem List Items  Addressed This Visit    Hyperlipidemia (Chronic)    Reassured that 74 is close enough to < 70.  Continue Crestor.      Osteoporosis of forearm    With prior traumatic fracture of mandible.  Completed 5 years of alendronate .  Taking prolia since 2015, next dose due Oct 19. Reviewed  the current controversies surrounding the risks and benefits of calcium supplementation.  Encouraged her to increase  dietary calcium through natural foods including almond/coconut milk      Pacemaker    Discussed her concerns about the lack of a provision in living will about turning PM off at end of life.  Advised to discuss with cardiology regarding how to ensure that this is done.       Prediabetes    Now with A1c of 5.4 with careful attention to following a low GI diet .  Return in 3 months  .Body mass index is 22.53 kg/m.       Paronychia of third toe of right foot    Keflex and continued soaks prescribed due to persistent symptoms in spite of clinical appearance of normal.  Probiotic advised       Relevant Medications   cephALEXin (KEFLEX) 500 MG capsule    Other Visit Diagnoses    Encounter for immunization       Relevant Orders   Flu vaccine HIGH DOSE PF (Completed)    A total of 25 minutes of face to face time was spent with patient more than half of which was spent in counselling about the above mentioned conditions  and coordination of care   I have discontinued Ms. Aceituno sulfamethoxazole-trimethoprim. I am also having her start on cephALEXin. Additionally, I am having her maintain her montelukast, albuterol, ALPRAZolam, azelastine, traMADol, amoxicillin, metoprolol tartrate, butalbital-acetaminophen-caffeine, Vitamin D, Co Q 10, b complex vitamins, apixaban, furosemide, and rosuvastatin.  Meds ordered this encounter  Medications  . cephALEXin (KEFLEX) 500 MG capsule    Sig: Take 1 capsule (500 mg total) by mouth 4 (four) times daily.    Dispense:  28 capsule    Refill:  0     Medications Discontinued During This Encounter  Medication Reason  . sulfamethoxazole-trimethoprim (BACTRIM DS,SEPTRA DS) 800-160 MG tablet Completed Course    Follow-up: Return in about 6 months (around 02/16/2017).   [name], MD

## 2016-08-20 ENCOUNTER — Ambulatory Visit (INDEPENDENT_AMBULATORY_CARE_PROVIDER_SITE_OTHER)
Admission: RE | Admit: 2016-08-20 | Discharge: 2016-08-20 | Disposition: A | Discharge status: Home / Self Care | Payer: Medicare Other | Source: Ambulatory Visit | Attending: Pulmonary Disease | Admitting: Pulmonary Disease

## 2016-08-20 DIAGNOSIS — R911 Solitary pulmonary nodule: Secondary | ICD-10-CM

## 2016-08-20 DIAGNOSIS — L03031 Cellulitis of right toe: Secondary | ICD-10-CM | POA: Insufficient documentation

## 2016-08-20 NOTE — Assessment & Plan Note (Signed)
Discussed her concerns about the lack of a provision in living will about turning PM off at end of life.  Advised to discuss with cardiology regarding how to ensure that this is done.

## 2016-08-20 NOTE — Assessment & Plan Note (Signed)
Reassured that 74 is close enough to < 70.  Continue Crestor.

## 2016-08-20 NOTE — Assessment & Plan Note (Signed)
Now with A1c of 5.4 with careful attention to following a low GI diet .  Return in 3 months  .Body mass index is 22.53 kg/m.

## 2016-08-20 NOTE — Assessment & Plan Note (Signed)
With prior traumatic fracture of mandible.  Completed 5 years of alendronate .  Taking prolia since 2015, next dose due Oct 19. Reviewed  the current controversies surrounding the risks and benefits of calcium supplementation.  Encouraged her to increase dietary calcium through natural foods including almond/coconut milk

## 2016-08-20 NOTE — Assessment & Plan Note (Signed)
Keflex and continued soaks prescribed due to persistent symptoms in spite of clinical appearance of normal.  Probiotic advised

## 2016-08-22 ENCOUNTER — Ambulatory Visit (INDEPENDENT_AMBULATORY_CARE_PROVIDER_SITE_OTHER): Payer: Medicare Other | Admitting: *Deleted

## 2016-08-22 DIAGNOSIS — I495 Sick sinus syndrome: Secondary | ICD-10-CM | POA: Diagnosis not present

## 2016-08-22 NOTE — Progress Notes (Signed)
Remote pacemaker transmission.   

## 2016-08-23 ENCOUNTER — Encounter: Payer: Self-pay | Admitting: Cardiology

## 2016-09-02 ENCOUNTER — Ambulatory Visit (INDEPENDENT_AMBULATORY_CARE_PROVIDER_SITE_OTHER): Payer: Medicare Other | Admitting: Pulmonary Disease

## 2016-09-02 ENCOUNTER — Encounter: Payer: Self-pay | Admitting: Internal Medicine

## 2016-09-02 ENCOUNTER — Telehealth: Payer: Self-pay | Admitting: Internal Medicine

## 2016-09-02 ENCOUNTER — Encounter: Payer: Self-pay | Admitting: Pulmonary Disease

## 2016-09-02 VITALS — BP 116/64 | HR 82 | Ht 65.0 in | Wt 135.0 lb

## 2016-09-02 DIAGNOSIS — IMO0001 Reserved for inherently not codable concepts without codable children: Secondary | ICD-10-CM

## 2016-09-02 DIAGNOSIS — R911 Solitary pulmonary nodule: Secondary | ICD-10-CM

## 2016-09-02 DIAGNOSIS — I272 Pulmonary hypertension, unspecified: Secondary | ICD-10-CM

## 2016-09-02 NOTE — Telephone Encounter (Signed)
Pt scheduled her 6 mth follow-up visit in December with Dr. Hilarie Fredrickson. Pt would like to go ahead and have her CT scan done prior to the OV so she can discuss the results with Dr. Hilarie Fredrickson. Please advise regarding CT.

## 2016-09-02 NOTE — Patient Instructions (Signed)
   Call me if you have any new breathing problems or questions before your next appointment.  I will see you back in 1 year after your CT scan or sooner if needed.  TESTS ORDERED: 1. CT Chest w/o September 2018

## 2016-09-02 NOTE — Progress Notes (Signed)
Subjective:    Patient ID: [name] [name], female    DOB: November 04, 1941, 75 y.o.   MRN: [id number]  C.C.:  Follow-up for Right Lung Opacity/Nodule, Pulmonary Hypertension, & Mild Restrictive Lung Disease.  HPI  Patient had a TIA in June. She had numbness in her left arm & leg that resolved in "5 minutes". She reports no problems with her speech.   Right Lung Opacity/Nodule:  Right lower lobe nodule initially found on CT imaging in September 2016. This nodule has not changed in size as of CT imaging from September 2017. Question possible noncalcified granuloma as the patient does have a calcified nodule within right upper lobe.  Pulmonary Hypertension:  Likely secondary to LVH, Diastolic Dysfunction & Mitral valve dysfunction. S/P mitral valve replacement. No new edema. No new dyspnea or cough. She has noticed some edema in her legs last week but has been traveling in a car more frequently.   Mild Restrictive Lung Disease: No evidence for interstitial lung disease on CT imaging. Likely secondary to LVH.   Review of Systems No chest pain, pressure, or palpitations. No headaches or vision changes. Recently diagnosed with ocular migraines. No further weakness, numbness, or tingling.   Allergies  Allergen Reactions  . Meloxicam Other (See Comments)    Severe reflux  . Codeine Nausea And Vomiting    migraine  . Doxycycline Itching and Swelling    Facial  . Hydrocodone Nausea And Vomiting    MIGRAINE  . Hydromorphone Nausea And Vomiting    migraine  . Oxycodone Nausea And Vomiting    Severe migraine   Current Outpatient Prescriptions on File Prior to Visit  Medication Sig Dispense Refill  . albuterol (PROVENTIL HFA;VENTOLIN HFA) 108 (90 BASE) MCG/ACT inhaler Inhale 2 puffs into the lungs every 6 (six) hours as needed for wheezing or shortness of breath. 1 Inhaler 2  . ALPRAZolam (XANAX) 0.25 MG tablet Take 1 tablet (0.25 mg total) by mouth at bedtime as needed for anxiety or sleep. 30  tablet 5  . amoxicillin (AMOXIL) 500 MG tablet Take 4 tablets (2,000 mg total) by mouth once. Take 4 tablets 60 minutes prior to dental work 30 tablet 1  . apixaban (ELIQUIS) 5 MG TABS tablet Take 1 tablet (5 mg total) by mouth 2 (two) times daily. 180 tablet 3  . azelastine (ASTELIN) 0.1 % nasal spray Place 1 spray into both nostrils as needed for allergies.   5  . b complex vitamins tablet Take 1 tablet by mouth daily.    . butalbital-acetaminophen-caffeine (FIORICET, ESGIC) 50-325-40 MG tablet TAKE 1 TABLET BY MOUTH AS NEEDED FOR BACK PAIN OR HEADACHE 90 tablet 2  . Cholecalciferol (VITAMIN D) 2000 units CAPS Take 2,000 Units by mouth daily.    . Coenzyme Q10 (CO Q 10) 100 MG CAPS Take 200 mg by mouth daily.     . furosemide (LASIX) 40 MG tablet Take 40 mg by mouth as directed.    . metoprolol tartrate (LOPRESSOR) 25 MG tablet Take 0.5 tablets (12.5 mg total) by mouth 2 (two) times daily. 90 tablet 3  . montelukast (SINGULAIR) 10 MG tablet Take 10 mg by mouth at bedtime.     . rosuvastatin (CRESTOR) 40 MG tablet Take 1 tablet (40 mg total) by mouth daily. 90 tablet 1   No current facility-administered medications on file prior to visit.    Past Medical History:  Diagnosis Date  . Acute on chronic diastolic (congestive) heart failure   . Anxiety  associated with depression    Prn alprazolam   . Atrial fibrillation, persistent ([place])    DCCV 08/22/2015  . Bradycardia post-op bradycardia, pacer dependent   MDT PPM 11/06/15, Dr. Lovena Le  . Diverticulosis   . Hemorrhoid   . Hepatic cyst    innumerable  . 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 Hyperlipidemia   . IBS (irritable bowel syndrome)   . Idiopathic thrombocytopenic purpura (ITP)   . Migraine   . MVP (mitral valve prolapse)   . Nodule of right lung   . RBBB   . Restrictive lung disease    Mild on PFT & likely cardiac in etiology   . S/P Minimally invasive maze operation for atrial fibrillation 10/31/2015   Complete bilateral atrial  lesion set using cryothermy and bipolar radiofrequency ablation with clipping of LA appendage via right mini thoracotomy approach  . S/P minimally invasive mitral valve replacement with bioprosthetic valve 10/31/2015   33 mm [facility] Mitral bovine bioprosthetic tissue valve placed via right mini thoracotomy approach  . Severe mitral regurgitation   . SVT (supraventricular tachycardia) ([place])   . Thoracic aorta atherosclerosis [facility])    Past Surgical History:  Procedure Laterality Date  . BREAST BIOPSY Right Late 80s   Negative  . CARDIAC CATHETERIZATION N/A 10/18/2015   Procedure: Right/Left Heart Cath and Coronary Angiography;  Surgeon: Sherren Mocha, MD;  Location: [place] [facility];  Service: Cardiovascular;  Laterality: N/A;  . CARDIOVERSION N/A 08/22/2015   Procedure: CARDIOVERSION;  Surgeon: Thayer Headings, MD;  Location: [place];  Service: Cardiovascular;  Laterality: N/A;  . COLONOSCOPY  2003  . EP IMPLANTABLE DEVICE N/A 11/06/2015   Procedure: Pacemaker Implant;  Surgeon: Evans Lance, MD;  Location: [place] [facility];  Service: Cardiovascular;  Laterality: N/A;  . EP IMPLANTABLE DEVICE N/A 02/20/2016   Procedure: Lead Extraction;  Surgeon: Evans Lance, MD;  Location: [place] [facility];  Service: Cardiovascular;  Laterality: N/A;  . EYE SURGERY Right 2013  . FOOT SURGERY    . MANDIBLE FRACTURE SURGERY  03-26-13  . MINIMALLY INVASIVE MAZE PROCEDURE N/A 10/31/2015   Procedure: MINIMALLY INVASIVE MAZE PROCEDURE;  Surgeon: Rexene Alberts, MD;  Location: [place];  Service: Open Heart Surgery;  Laterality: N/A;  . MITRAL VALVE REPLACEMENT Right 10/31/2015   Procedure: MINIMALLY INVASIVE MITRAL VALVE (MV) REPLACEMENT;  Surgeon: Rexene Alberts, MD;  Location: [place];  Service: Open Heart Surgery;  Laterality: Right;  . PACEMAKER LEAD REMOVAL  02/20/2016  . TEE WITH CARDIOVERSION    . TEE WITHOUT CARDIOVERSION N/A 08/22/2015   Procedure: TRANSESOPHAGEAL ECHOCARDIOGRAM (TEE);   Surgeon: Thayer Headings, MD;  Location: [place];  Service: Cardiovascular;  Laterality: N/A;  . TEE WITHOUT CARDIOVERSION N/A 10/31/2015   Procedure: TRANSESOPHAGEAL ECHOCARDIOGRAM (TEE);  Surgeon: Rexene Alberts, MD;  Location: [place];  Service: Open Heart Surgery;  Laterality: N/A;  . TUBAL LIGATION    . VARICOSE VEIN SURGERY Right    Family History  Problem Relation Age of Onset  . Hypertension Mother   . Arrhythmia Mother   . Heart failure Mother   . Arrhythmia Brother   . Stroke Brother 66    cerebral hemorrhage, nonsmoker, no HTN  . Prostate cancer Brother   . Stroke Father     from an aneurysm  . Stroke Maternal Aunt 83    cerebral hemorrhage  . Liver cancer Maternal Grandmother   . Atrial fibrillation Son   .  Celiac disease Son   . Heart attack Neg Hx   . Breast cancer Neg Hx    Social History   Social History  . Marital status: Widowed    Spouse name: Deceased  . Number of children: 2  . Years of education: 16   Occupational History  . Retired    Social History Main Topics  . Smoking status: Never Smoker  . Smokeless tobacco: Never Used  . Alcohol use No  . Drug use: No  . Sexual activity: No   Other Topics Concern  . None   Social History Narrative   She is a widow.  Husband died from metastatic renal cell cancer. Originally from [place]. Previously lived in [place] from 1975-[place]. Moved to [place] in 2007. No mold exposure recently but did have it through a prior work exposure in 1993. Has a masters in public health. No bird exposure.      Objective:   Physical Exam BP 116/64 (BP Location: Left Arm, Cuff Size: Normal)   Pulse 82   Ht 5\' 5"  (1.651 m)   Wt 135 lb (61.2 kg)   LMP  (Exact Date)   SpO2 100%   BMI 22.47 kg/m  General:  Thin, frail female. Comfortable. No distress. Integument:  Warm & dry. No rash on exposed skin.  HEENT:  Moist mucus membranes. No scleral icterus. No nasal turbinate swelling. Cardiovascular: Mild bradycardia.  Regular rhythm. No edema.  Pulmonary:  No accessory muscle use on room air. Overall good aeration & clear to auscultation. Abdomen: Soft. Normal bowel sounds. Nontender. Musculoskeletal:  Normal bulk and tone. No joint effusion appreciated. Neurological: Cranial nerves II-12 intact. Symmetric bilateral brachioradialis reflexes are 2+. Hand grip 4+/5 bilaterally.  PFT 09/01/15: FVC 2.58 L (84%) FEV1 2.02 L (87%) FEV1/FVC 0.78 FEF 25-75 1.76 L (98%) no bronchodilator response TLC 3.82 L (71%) RV 54% ERV 195% DLCO uncorrected 83%   IMAGING CT CHEST W/O 08/20/16 (personally reviewed by me):6 mm right lower lobe nodule unchanged. No new nodule or opacity appreciated. No pathologic mediastinal adenopathy. No pleural effusion or thickening. No pericardial effusion. 3 mm calcified nodule subpleural and right upper lobe unchanged.  CT CHEST W/O 02/05/16 (previously reviewed by me): 6 mm nodule in right lower lobe without sign of enlargement. No new nodule or opacity. No pleural effusion or thickening. No pathologic mediastinal adenopathy. No pericardial effusion.   CTA CHEST 10/27/15 (previously reviewed by me): Suggestion of hepatic cysts on CT imaging. 7 mm nodule within the sulcus of right lower lobe lateral subsegment. No developing nodule or opacity appreciated by me. No pleural effusion. No pathologic mediastinal adenopathy. No pulmonary embolus. Previously noted scarring persists. Calcified granuloma noted within right upper lobe still present.  CT CHEST W/O 08/15/15 (previously reviewed by me): Multiple cystic lesions within the liver. 6 mm nodule inferior lateral segment right lower lobe. Previously noted central opacification within right upper lobe has resolved. Patient's bilateral pleural effusions have largely resolved with a very tiny amount of pleural effusion still present on the right. There appears to be some slight decrease in the consolidation within the medial segment of the right middle lobe  point compared with prior imaging. It's difficult to discern whether or not a lung nodule was present on previous imaging given the compression from prior pleural effusion.   CT CHEST W/ 07/31/15 (previously reviewed by me):  Bilateral pleural effusions right greater than left. No pathologic mediastinal adenopathy. Patchy opacity in the right middle lobe. With central  opacification present within right upper lobe.  CARDIAC TTE (03/04/16): LV EF 35-40%. No regional wall motion abnormalities. Profound apex to base and septum to lateral wall dyssynchrony. Unable to assess diastolic function. LA moderately dilated. RV moderately dilated with mildly reduced systolic function. No aortic stenosis or regurgitation. Aortic root normal in size. Bioprosthetic mitral valve functioning appropriately with no significant perivalvular regurgitation.  TTE (07/31/15): Difficult study. LV normal in size with mild concentric LVH. EF 60%. Unable to assess diastolic function due to atrial fibrillation. Normal regional function. LA & RA severely enlarged. RV normal in size and function. Pulmonary artery systolic pressure [id number] mmHg. IVC dilated. Aortic valve not well seen. Trace aortic regurgitation. Moderate mitral valve stenosis with mild regurgitation. Mild tricuspid regurgitation. No pulmonic stenosis or regurgitation. No pericardial effusion.  LABS 08/15/15 CBC: 6.3/12.7/38.4/203 BMP: 136/4.6/102/27/24/0.83/100/9.3 INR: 1.4    Assessment & Plan:  75 y.o. female with right lower lobe lung nodule as well as pulmonary hypertension. Patient does appear euvolemic today. I did review her chest CT scan with her which shows continued stability in her 6 mm right lower lobe nodule. Given continued stability in her nodule size we will follow with repeat CT imaging per guidelines of one year. She does overall appear to have no worsening in her underlying pulmonary hypertension despite the findings on her echocardiogram with worsening  in her ejection fraction. I instructed the patient to notify my office if she had any new breathing problems or questions before her next appointment.  1. Right Lower Lobe Nodule: Repeat CT chest without contrast September 2018. 2. Pulmonary Hypertension: Following with cardiology. Likely secondary to left ventricular dysfunction. 3. Mild Restrictive Lung Disease: No evidence of interstitial lung disease. No need for further testing. 4. Health Maintenance:  S/P Tdap July 2013, Prevnar February 2015, & Pneumovax 23 June 2012. Received High Dose Flu Vaccine in September 2017. 5. Follow-up: Patient to return to clinic in 1 year after CT scan or sooner if needed.  Sonia Baller Ashok Cordia, M.D. [facility] Pager:  [phone] After 3pm or if no response, call [phone] 11:07 AM 09/02/16

## 2016-09-03 ENCOUNTER — Other Ambulatory Visit: Payer: Self-pay

## 2016-09-03 DIAGNOSIS — K7689 Other specified diseases of liver: Secondary | ICD-10-CM

## 2016-09-03 DIAGNOSIS — K529 Noninfective gastroenteritis and colitis, unspecified: Secondary | ICD-10-CM

## 2016-09-03 LAB — CUP PACEART REMOTE DEVICE CHECK
Battery Impedance: 112 Ohm
Battery Remaining Longevity: 122 mo
Battery Voltage: 2.79 V
Brady Statistic AP VP Percent: 93 %
Brady Statistic AP VS Percent: 0 %
Brady Statistic AS VP Percent: 7 %
Brady Statistic AS VS Percent: 0 %
Date Time Interrogation Session: 20170921114425
Implantable Lead Implant Date: 20161205
Implantable Lead Implant Date: 20170321
Implantable Lead Location: 753859
Implantable Lead Location: 753860
Implantable Lead Model: 5076
Implantable Lead Model: 5076
Lead Channel Impedance Value: 464 Ohm
Lead Channel Impedance Value: 724 Ohm
Lead Channel Pacing Threshold Amplitude: 0.5 V
Lead Channel Pacing Threshold Amplitude: 0.625 V
Lead Channel Pacing Threshold Pulse Width: 0.4 ms
Lead Channel Pacing Threshold Pulse Width: 0.4 ms
Lead Channel Setting Pacing Amplitude: 2 V
Lead Channel Setting Pacing Amplitude: 2.5 V
Lead Channel Setting Pacing Pulse Width: 0.4 ms
Lead Channel Setting Sensing Sensitivity: 4 mV

## 2016-09-03 NOTE — Telephone Encounter (Signed)
Spoke with pt and she is aware, orders in epic. 

## 2016-09-03 NOTE — Telephone Encounter (Signed)
My recommendation after the last CT was to follow LFTs, rather than with serial/repeat imaging I would recommend CMP, INR, CBC before next OV, but I do not think she needs a CT scan at this time.

## 2016-09-03 NOTE — Telephone Encounter (Signed)
Left message for pt to call back  °

## 2016-09-16 ENCOUNTER — Telehealth: Payer: Self-pay | Admitting: Internal Medicine

## 2016-09-16 NOTE — Telephone Encounter (Signed)
Pt called asking about the Prolia injection that she is supposed to be getting, and she has not heard anything regarding this. Please advise, thank you!  Patient will be away 10/24-10/31.  Call pt @ [phone]

## 2016-09-16 NOTE — Telephone Encounter (Signed)
verfication sent, awaiting response

## 2016-09-19 NOTE — Telephone Encounter (Signed)
Spoke with the patient and reviewed verification, scheduled for tomorrow afternoon to receive the injection. thanks

## 2016-09-19 NOTE — Telephone Encounter (Signed)
FYI Pt requested to be called when approval notice arrives.  Pt is aware of your previous note  Pt contact [phone]

## 2016-09-20 ENCOUNTER — Ambulatory Visit (INDEPENDENT_AMBULATORY_CARE_PROVIDER_SITE_OTHER): Payer: Medicare Other

## 2016-09-20 DIAGNOSIS — M810 Age-related osteoporosis without current pathological fracture: Secondary | ICD-10-CM

## 2016-09-20 MED ORDER — DENOSUMAB 60 MG/ML ~~LOC~~ SOLN
60.0000 mg | Freq: Once | SUBCUTANEOUS | Status: AC
  Administered 2016-09-20: 60 mg via SUBCUTANEOUS

## 2016-09-20 NOTE — Progress Notes (Signed)
Patient came in for Prolia injection.  Received and tolerated well.

## 2016-10-01 NOTE — Progress Notes (Signed)
  I have reviewed the above information and agree with above.   Dionne Knoop, MD 

## 2016-10-16 ENCOUNTER — Encounter: Payer: Self-pay | Admitting: *Deleted

## 2016-10-21 ENCOUNTER — Ambulatory Visit (INDEPENDENT_AMBULATORY_CARE_PROVIDER_SITE_OTHER): Payer: Medicare Other | Admitting: Thoracic Surgery (Cardiothoracic Vascular Surgery)

## 2016-10-21 ENCOUNTER — Encounter: Payer: Self-pay | Admitting: Thoracic Surgery (Cardiothoracic Vascular Surgery)

## 2016-10-21 VITALS — BP 107/70 | HR 84 | Resp 20 | Ht 65.0 in | Wt 134.0 lb

## 2016-10-21 DIAGNOSIS — Z9889 Other specified postprocedural states: Secondary | ICD-10-CM

## 2016-10-21 DIAGNOSIS — Z8679 Personal history of other diseases of the circulatory system: Secondary | ICD-10-CM | POA: Diagnosis not present

## 2016-10-21 DIAGNOSIS — Z953 Presence of xenogenic heart valve: Secondary | ICD-10-CM

## 2016-10-21 NOTE — Patient Instructions (Signed)

## 2016-10-21 NOTE — Progress Notes (Signed)
[address]       [place], [address]             [phone]     CARDIOTHORACIC SURGERY OFFICE NOTE  Referring Provider is Nahser, Wonda Cheng, MD PCP is [name], MD   HPI:  Patient is a 75 year old female who returns to the office today for routine follow-up approximately one year following minimally invasive mitral valve replacement using a bioprosthetic tissue valve with Maze procedure on 10/31/2015 for severe symptomatic primary mitral regurgitation with recurrent persistent atrial fibrillation. Her early postoperative recovery was notable for severe bradycardia which persisted, ultimately requiring permanent pacemaker placement by Dr. Lovena Le on 11/06/2015. She was last seen here in our office on 02/19/2016 at which time she was doing well and maintaining sinus rhythm. Since then she underwent routine follow-up echocardiogram on 03/04/2016. This revealed moderate left ventricular systolic dysfunction with ejection fraction estimated 35-40% there was significant apex to base and septal to lateral wall dyssynchrony consistent with right ventricular pacing. The bioprosthetic tissue valve in the mitral position was functioning normally and there was no mitral regurgitation.  She was also noted to have increased pacing thresholds, and she was seen in follow-up by Dr. Lovena Le who subsequently revised her pacemaker leads.  She was seen in follow-up by Dr. Acie Fredrickson in May at which time warfarin was discontinued and the patient was started on Eliquis.  In early June and she was hospitalized briefly for a possible TIA. She was in sinus rhythm at the time and no specific cause was identified.  Shortly after that she was seen in follow-up by Dr. Lovena Le her pacemaker was interrogated and it was noted that she had remained in sinus rhythm throughout the time at which she had the brief neurologic event.  Since then the patient has done quite well and she returns to our office today for  routine follow-up. She reports that overall she feels very good. She feels much improved in comparison with how she felt prior to her heart surgery one year ago. She stays remarkably active for her age, but she does complain that she gets tired more easily than she used to. She denies any symptoms of exertional shortness of breath unless she does strenuous activity. She has not had any chest pain or chest tightness. Overall she feels well and is delighted with her progress.   Current Outpatient Prescriptions  Medication Sig Dispense Refill  . albuterol (PROVENTIL HFA;VENTOLIN HFA) 108 (90 BASE) MCG/ACT inhaler Inhale 2 puffs into the lungs every 6 (six) hours as needed for wheezing or shortness of breath. 1 Inhaler 2  . ALPRAZolam (XANAX) 0.25 MG tablet Take 1 tablet (0.25 mg total) by mouth at bedtime as needed for anxiety or sleep. 30 tablet 5  . amoxicillin (AMOXIL) 500 MG tablet Take 4 tablets (2,000 mg total) by mouth once. Take 4 tablets 60 minutes prior to dental work 30 tablet 1  . apixaban (ELIQUIS) 5 MG TABS tablet Take 1 tablet (5 mg total) by mouth 2 (two) times daily. 180 tablet 3  . azelastine (ASTELIN) 0.1 % nasal spray Place 1 spray into both nostrils as needed for allergies.   5  . b complex vitamins tablet Take 1 tablet by mouth daily.    . butalbital-acetaminophen-caffeine (FIORICET, ESGIC) 50-325-40 MG tablet TAKE 1 TABLET BY MOUTH AS NEEDED FOR BACK PAIN OR HEADACHE 90 tablet 2  . Cholecalciferol (VITAMIN D) 2000 units CAPS Take 2,000 Units  by mouth daily.    . Coenzyme Q10 (CO Q 10) 100 MG CAPS Take 200 mg by mouth daily.     . furosemide (LASIX) 40 MG tablet Take 40 mg by mouth as directed.    . metoprolol tartrate (LOPRESSOR) 25 MG tablet Take 0.5 tablets (12.5 mg total) by mouth 2 (two) times daily. 90 tablet 3  . montelukast (SINGULAIR) 10 MG tablet Take 10 mg by mouth at bedtime.     . rosuvastatin (CRESTOR) 40 MG tablet Take 1 tablet (40 mg total) by mouth daily. 90  tablet 1   No current facility-administered medications for this visit.       Physical Exam:   BP 107/70 (BP Location: Left Arm, Patient Position: Sitting, Cuff Size: Normal)   Pulse 84   Resp 20   Ht 5\' 5"  (1.651 m)   Wt 134 lb (60.8 kg)   LMP  (Exact Date)   SpO2 94%   BMI 22.30 kg/m   General:  Well-appearing  Chest:   Clear to auscultation  CV:   Regular rate and rhythm without murmur  Incisions:  Completely healed  Abdomen:  Soft nontender  Extremities:  Warm and well-perfused  Diagnostic Tests:  Transthoracic Echocardiography  Patient:    [name] [name], [name] [name] MR #:       [id number] Study Date: 03/04/2016 Gender:     F Age:        1 Height:     165.1 cm Weight:     62.7 kg BSA:        1.7 m^2 Pt. Status: Room:   ATTENDING    Darylene Price, M.D.  ORDERING     Darylene Price, M.D.  SONOGRAPHER  Cindy Hazy, RDCS  PERFORMING   [facility], Outpatient  cc:  ------------------------------------------------------------------- LV EF: 35% -   40%  ------------------------------------------------------------------- Indications:      I05.9 Mitral Valve disorder.  ------------------------------------------------------------------- History:   PMH:  Acquired from the patient and from the patient&'s chart.  PMH:  Atrial Fibrillation.  ------------------------------------------------------------------- Study Conclusions  - Left ventricle: Systolic function was moderately reduced. The   estimated ejection fraction was in the range of 35% to 40%.   Moderate diffuse hypokinesis with no identifiable regional   variations. There is profound apex-to-base and septum-to-lateral   wall dyssynchrony. The study is not technically sufficient to   allow evaluation of LV diastolic function. - Ventricular septum: Septal motion showed abnormal function,   dyssynergy, and paradox. These changes are consistent with right   ventricular pacing. - Mitral valve: A bioprosthesis  was present and functioning   normally. - Left atrium: The atrium was moderately dilated. - Right ventricle: The cavity size was moderately dilated. Systolic   function was mildly reduced.  ------------------------------------------------------------------- Labs, prior tests, procedures, and surgery: Mitral Valve Replacement.          Transthoracic echocardiography. M-mode, complete 2D, spectral Doppler, and color Doppler. Birthdate:  Patient birthdate: 04-Nov-1941.  Age:  Patient is 75 yr old.  Sex:  Gender: female.    BMI: 23 kg/m^2.  Blood pressure: 105/67  Patient status:  Outpatient.  Study date:  Study date: 03/04/2016. Study time: 04:26 PM.  Location:  [facility] [facility]  -------------------------------------------------------------------  ------------------------------------------------------------------- Left ventricle:  Systolic function was moderately reduced. The estimated ejection fraction was in the range of 35% to 40%. Moderate diffuse hypokinesis with no identifiable regional variations. There is profound apex-to-base and septum-to-lateral wall dyssynchrony. The study is not technically sufficient to  allow evaluation of LV diastolic function.  ------------------------------------------------------------------- Aortic valve:   Structurally normal valve.   Cusp separation was normal.  Doppler:  Transvalvular velocity was within the normal range. There was no stenosis. There was no regurgitation.  ------------------------------------------------------------------- Aorta:  Aortic root: The aortic root was normal in size. Ascending aorta: The ascending aorta was normal in size.  ------------------------------------------------------------------- Mitral valve:  A bioprosthesis was present and functioning normally.  Doppler:  There was trivial regurgitation directed centrally. There was no significant perivalvular regurgitation. Valve area by pressure half-time:  2.39 cm^2. Indexed valve area by pressure half-time: 1.4 cm^2/m^2. Valve area by continuity equation (using LVOT flow): 0.93 cm^2. Indexed valve area by continuity equation (using LVOT flow): 0.55 cm^2/m^2.    Mean gradient (D): 4 mm Hg. Peak gradient (D): 5 mm Hg.  ------------------------------------------------------------------- Left atrium:  The atrium was moderately dilated.  ------------------------------------------------------------------- Right ventricle:  The cavity size was moderately dilated. Pacer wire or catheter noted in right ventricle. Systolic function was mildly reduced.  ------------------------------------------------------------------- Ventricular septum:   Septal motion showed abnormal function, dyssynergy, and paradox. These changes are consistent with right ventricular pacing.  ------------------------------------------------------------------- Pulmonic valve:    The valve appears to be grossly normal. Doppler:  There was mild to moderate regurgitation.  ------------------------------------------------------------------- Systemic veins: Inferior vena cava: The vessel was normal in size. The respirophasic diameter changes were in the normal range (>= 50%), consistent with normal central venous pressure.  ------------------------------------------------------------------- Measurements   Left ventricle                            Value          Reference  LV ID, ED, PLAX chordal                   46.5  mm       43 - 52  LV ID, ES, PLAX chordal                   29.3  mm       23 - 38  LV fx shortening, PLAX chordal            37    %        >=29  LV PW thickness, ED                       12.5  mm       ---------  IVS/LV PW ratio, ED                       0.78           <=1.3  Stroke volume, 2D                         27    ml       ---------  Stroke volume/bsa, 2D                     16    ml/m^2   ---------  LV e&', lateral                             4.47  cm/s     ---------  LV E/e&', lateral  25.5           ---------  LV e&', medial                             4.56  cm/s     ---------  LV E/e&', medial                           25             ---------  LV e&', average                            4.52  cm/s     ---------  LV E/e&', average                          25.25          ---------    Ventricular septum                        Value          Reference  IVS thickness, ED                         9.74  mm       ---------    LVOT                                      Value          Reference  LVOT ID, S                                17    mm       ---------  LVOT area                                 2.27  cm^2     ---------  LVOT ID                                   17    mm       ---------  LVOT peak velocity, S                     60.9  cm/s     ---------  LVOT mean velocity, S                     44.2  cm/s     ---------  LVOT VTI, S                               12    cm       ---------  LVOT peak gradient, S                     1     mm Hg    ---------  Stroke volume (SV), LVOT DP  27.2  ml       ---------  Stroke index (SV/bsa), LVOT DP            16    ml/m^2   ---------    Aorta                                     Value          Reference  Aortic root ID, ED                        35    mm       ---------    Left atrium                               Value          Reference  LA ID, A-P, ES                            47    mm       ---------  LA ID/bsa, A-P                    (H)     2.76  cm/m^2   <=2.2  LA volume, S                              65    ml       ---------  LA volume/bsa, S                          38.2  ml/m^2   ---------  LA volume, ES, 1-p A4C                    58    ml       ---------  LA volume/bsa, ES, 1-p A4C                34.1  ml/m^2   ---------  LA volume, ES, 1-p A2C                    73    ml       ---------  LA volume/bsa, ES, 1-p A2C                 42.9  ml/m^2   ---------    Mitral valve                              Value          Reference  Mitral E-wave peak velocity               114   cm/s     ---------  Mitral A-wave peak velocity               69.1  cm/s     ---------  Mitral mean velocity, D                   92.2  cm/s     ---------  Mitral deceleration time          (  H)     320   ms       150 - 230  Mitral pressure half-time                 96    ms       ---------  Mitral mean gradient, D                   4     mm Hg    ---------  Mitral peak gradient, D                   5     mm Hg    ---------  Mitral E/A ratio, peak                    1.6            ---------  Mitral valve area, PHT, DP                2.39  cm^2     ---------  Mitral valve area/bsa, PHT, DP            1.4   cm^2/m^2 ---------  Mitral valve area, LVOT                   0.93  cm^2     ---------  continuity  Mitral valve area/bsa, LVOT               0.55  cm^2/m^2 ---------  continuity  Mitral annulus VTI, D                     29.2  cm       ---------  Mitral regurg VTI, PISA                   145   cm       ---------    Tricuspid valve                           Value          Reference  Tricuspid regurg peak velocity            215   cm/s     ---------  Tricuspid peak RV-RA gradient             18    mm Hg    ---------    Right ventricle                           Value          Reference  RV ID, minor axis, ED, A4C        (H)     47.95 mm       26 - 43  RV s&', lateral, S                         6.88  cm/s     ---------  Legend: (L)  and  (H)  mark values outside specified reference range.  ------------------------------------------------------------------- Prepared and Electronically Authenticated by  Sanda Klein, MD 2017-04-03T18:02:44   Impression:  Patient is doing well approximately 1 year following mitral valve replacement using a bioprosthetic tissue valve and Maze procedure.  At present she reports only mild symptoms of  chronic exertional fatigue and shortness  of breath consistent with chronic combined systolic and diastolic congestive heart failure, New York Heart Association functional class I.  She has not had any palpitations and previous follow-up interrogations of her permanent pacemaker have documented that she has remained in sinus rhythm. She did have a brief TIA last June of unclear etiology that was not associated with any atrial dysrhythmias. She was anticoagulated using Eliquis at the time. Last follow-up echocardiogram revealed normal functioning bioprosthetic tissue valve in the mitral position with moderate left ventricular systolic dysfunction that appeared to be affected by dyssynchrony caused by the patient's permanent pacemaker. The pacemaker lead has been revised since that time.   Plan:  We have not recommended any changes to the patient's current medical therapy. The patient will continue to follow-up with Dr. Acie Fredrickson and Dr. Lovena Le.  The patient has been reminded regarding the importance of dental hygiene and the lifelong need for antibiotic prophylaxis for all dental cleanings and other related invasive procedures.  She will be referred to the atrial fibrillation clinic for annual surveillance visits per protocol for patients with undergone Maze procedure.   I spent in excess of 15 minutes during the conduct of this office consultation and >50% of this time involved direct face-to-face encounter with the patient for counseling and/or coordination of their care.    Valentina Gu. Roxy Manns, MD 10/21/2016 2:16 PM

## 2016-11-08 ENCOUNTER — Other Ambulatory Visit: Payer: Self-pay

## 2016-11-12 ENCOUNTER — Ambulatory Visit (INDEPENDENT_AMBULATORY_CARE_PROVIDER_SITE_OTHER): Payer: Medicare Other

## 2016-11-12 DIAGNOSIS — Z23 Encounter for immunization: Secondary | ICD-10-CM

## 2016-11-12 DIAGNOSIS — K7689 Other specified diseases of liver: Secondary | ICD-10-CM | POA: Diagnosis not present

## 2016-11-12 NOTE — Progress Notes (Addendum)
Patient comes in for Twinrix injection.  Injected left deltoid.   Patient tolerated injection well.    Reviewed.  Dr Nicki Reaper

## 2016-11-15 ENCOUNTER — Other Ambulatory Visit (INDEPENDENT_AMBULATORY_CARE_PROVIDER_SITE_OTHER): Payer: Medicare Other

## 2016-11-15 DIAGNOSIS — K529 Noninfective gastroenteritis and colitis, unspecified: Secondary | ICD-10-CM

## 2016-11-15 DIAGNOSIS — R7989 Other specified abnormal findings of blood chemistry: Secondary | ICD-10-CM | POA: Diagnosis not present

## 2016-11-15 DIAGNOSIS — K7689 Other specified diseases of liver: Secondary | ICD-10-CM

## 2016-11-15 DIAGNOSIS — R945 Abnormal results of liver function studies: Principal | ICD-10-CM

## 2016-11-15 LAB — COMPREHENSIVE METABOLIC PANEL
ALT: 21 U/L (ref 0–35)
AST: 26 U/L (ref 0–37)
Albumin: 4.4 g/dL (ref 3.5–5.2)
Alkaline Phosphatase: 65 U/L (ref 39–117)
BUN: 28 mg/dL — ABNORMAL HIGH (ref 6–23)
CO2: 30 mEq/L (ref 19–32)
Calcium: 9.5 mg/dL (ref 8.4–10.5)
Chloride: 106 mEq/L (ref 96–112)
Creatinine, Ser: 0.85 mg/dL (ref 0.40–1.20)
GFR: 69.1 mL/min (ref >60.00)
Glucose, Bld: 97 mg/dL (ref 70–99)
Potassium: 4.3 mEq/L (ref 3.5–5.1)
Sodium: 142 mEq/L (ref 135–145)
Total Bilirubin: 0.4 mg/dL (ref 0.2–1.2)
Total Protein: 6.7 g/dL (ref 6.0–8.3)

## 2016-11-15 LAB — CBC WITH DIFFERENTIAL/PLATELET
Basophils Absolute: 0 10*3/uL (ref 0.0–0.1)
Basophils Relative: 0.7 % (ref 0.0–3.0)
Eosinophils Absolute: 0.1 10*3/uL (ref 0.0–0.7)
Eosinophils Relative: 1.8 % (ref 0.0–5.0)
HCT: 34.8 % — ABNORMAL LOW (ref 36.0–46.0)
Hemoglobin: 11.8 g/dL — ABNORMAL LOW (ref 12.0–15.0)
Lymphocytes Relative: 33 % (ref 12.0–46.0)
Lymphs Abs: 1.7 10*3/uL (ref 0.7–4.0)
MCHC: 33.8 g/dL (ref 30.0–36.0)
MCV: 89.4 fl (ref 78.0–100.0)
Monocytes Absolute: 0.3 10*3/uL (ref 0.1–1.0)
Monocytes Relative: 6.9 % (ref 3.0–12.0)
Neutro Abs: 2.9 10*3/uL (ref 1.4–7.7)
Neutrophils Relative %: 57.6 % (ref 43.0–77.0)
Platelets: 121 10*3/uL — ABNORMAL LOW (ref 150.0–400.0)
RBC: 3.9 Mil/uL (ref 3.87–5.11)
RDW: 14.2 % (ref 11.5–15.5)
WBC: 5 10*3/uL (ref 4.0–10.5)

## 2016-11-15 LAB — HEPATIC FUNCTION PANEL
ALT: 21 U/L (ref 0–35)
AST: 26 U/L (ref 0–37)
Albumin: 4.4 g/dL (ref 3.5–5.2)
Alkaline Phosphatase: 65 U/L (ref 39–117)
Bilirubin, Direct: 0.1 mg/dL (ref 0.0–0.3)
Total Bilirubin: 0.4 mg/dL (ref 0.2–1.2)
Total Protein: 6.7 g/dL (ref 6.0–8.3)

## 2016-11-15 LAB — PROTIME-INR
INR: 1.3 ratio — ABNORMAL HIGH (ref 0.8–1.0)
Prothrombin Time: 13.3 s — ABNORMAL HIGH (ref 9.6–13.1)

## 2016-11-18 ENCOUNTER — Other Ambulatory Visit: Payer: Self-pay

## 2016-11-18 DIAGNOSIS — K589 Irritable bowel syndrome without diarrhea: Secondary | ICD-10-CM

## 2016-11-21 ENCOUNTER — Ambulatory Visit (INDEPENDENT_AMBULATORY_CARE_PROVIDER_SITE_OTHER): Payer: Medicare Other | Admitting: *Deleted

## 2016-11-21 DIAGNOSIS — I495 Sick sinus syndrome: Secondary | ICD-10-CM

## 2016-11-21 NOTE — Progress Notes (Signed)
Remote pacemaker transmission.   

## 2016-11-22 ENCOUNTER — Ambulatory Visit (INDEPENDENT_AMBULATORY_CARE_PROVIDER_SITE_OTHER): Payer: Medicare Other | Admitting: Internal Medicine

## 2016-11-22 ENCOUNTER — Encounter: Payer: Self-pay | Admitting: Internal Medicine

## 2016-11-22 VITALS — BP 94/60 | HR 80 | Ht 65.0 in | Wt 135.4 lb

## 2016-11-22 DIAGNOSIS — Q446 Cystic disease of liver: Secondary | ICD-10-CM | POA: Diagnosis not present

## 2016-11-22 LAB — CUP PACEART REMOTE DEVICE CHECK
Battery Impedance: 160 Ohm
Battery Remaining Longevity: 111 mo
Battery Voltage: 2.79 V
Brady Statistic AP VP Percent: 94 %
Brady Statistic AP VS Percent: 0 %
Brady Statistic AS VP Percent: 6 %
Brady Statistic AS VS Percent: 0 %
Date Time Interrogation Session: 20171221124759
Implantable Lead Implant Date: 20161205
Implantable Lead Implant Date: 20170321
Implantable Lead Location: 753859
Implantable Lead Location: 753860
Implantable Lead Model: 5076
Implantable Lead Model: 5076
Implantable Pulse Generator Implant Date: 20161205
Lead Channel Impedance Value: 471 Ohm
Lead Channel Impedance Value: 706 Ohm
Lead Channel Pacing Threshold Amplitude: 0.5 V
Lead Channel Pacing Threshold Amplitude: 0.5 V
Lead Channel Pacing Threshold Pulse Width: 0.4 ms
Lead Channel Pacing Threshold Pulse Width: 0.4 ms
Lead Channel Setting Pacing Amplitude: 2 V
Lead Channel Setting Pacing Amplitude: 2.5 V
Lead Channel Setting Pacing Pulse Width: 0.4 ms
Lead Channel Setting Sensing Sensitivity: 4 mV

## 2016-11-22 NOTE — Patient Instructions (Signed)
If you are age 76 or older, your body mass index should be between 23-30. Your Body mass index is 22.53 kg/m. If this is out of the aforementioned range listed, please consider follow up with your Primary Care Provider.  If you are age 15 or younger, your body mass index should be between 19-25. Your Body mass index is 22.53 kg/m. If this is out of the aformentioned range listed, please consider follow up with your Primary Care Provider.   Please follow up with Dr Hilarie Fredrickson in 1 year.  Thank you.

## 2016-11-22 NOTE — Progress Notes (Signed)
Subjective:    Patient ID: [name] [name], female    DOB: January 28, 1941, 75 y.o.   MRN: [id number]  HPI Brianica Asel is a 75 year old female with history of multiple hepatic cysts (peliosis hepatis), A. fib status post Maze procedure, history of heart failure use here for follow-up. She was initially seen 6 months ago.  She reports she has been feeling well. She denies abdominal pain. No nausea or vomiting. Good appetite. No change in her bowel habits. No blood in her stool or melena. No jaundice, itching, ascites or lower extremity edema. No bleeding.  No further issues with TIA symptoms.  She continues on Eliquis under direction of cardiology.  Review of Systems As per history of present illness, otherwise negative  Current Medications, Allergies, Past Medical History, Past Surgical History, Family History and Social History were reviewed in [employer] record.     Objective:   Physical Exam BP 94/60   Pulse 80   Ht 5\' 5"  (1.651 m)   Wt 135 lb 6 oz (61.4 kg)   LMP  (Exact Date)   BMI 22.53 kg/m  Constitutional: Well-developed and well-nourished. No distress. HEENT: Normocephalic and atraumatic. Oropharynx is clear and moist. No oropharyngeal exudate. Conjunctivae are normal.  No scleral icterus. Neck: Neck supple. Trachea midline. Cardiovascular: Normal rate, regular rhythm and intact distal pulses. No M/R/G Pulmonary/chest: Effort normal and breath sounds normal. No wheezing, rales or rhonchi. Abdominal: Soft, nontender, nondistended. Bowel sounds active throughout. There are no masses palpable. No hepatosplenomegaly. Extremities: no clubbing, cyanosis, or edema Lymphadenopathy: No cervical adenopathy noted. Neurological: Alert and oriented to person place and time. Skin: Skin is warm and dry. No rashes noted. Psychiatric: Normal mood and affect. Behavior is normal.  CMP     Component Value Date/Time   NA 142 11/15/2016 1018   NA 142 05/22/2016  0000   K 4.3 11/15/2016 1018   CL 106 11/15/2016 1018   CO2 30 11/15/2016 1018   GLUCOSE 97 11/15/2016 1018   BUN 28 (H) 11/15/2016 1018   BUN 29 (H) 05/22/2016 0000   CREATININE 0.85 11/15/2016 1018   CREATININE 0.88 02/08/2016 1053   CALCIUM 9.5 11/15/2016 1018   PROT 6.7 11/15/2016 1018   PROT 6.7 11/15/2016 1018   PROT 5.8 (L) 12/05/2014 0848   ALBUMIN 4.4 11/15/2016 1018   ALBUMIN 4.4 11/15/2016 1018   ALBUMIN 4.0 12/05/2014 0848   AST 26 11/15/2016 1018   AST 26 11/15/2016 1018   ALT 21 11/15/2016 1018   ALT 21 11/15/2016 1018   ALKPHOS 65 11/15/2016 1018   ALKPHOS 65 11/15/2016 1018   BILITOT 0.4 11/15/2016 1018   BILITOT 0.4 11/15/2016 1018   GFRNONAA 75 05/22/2016 0000   GFRAA 86 05/22/2016 0000   CBC    Component Value Date/Time   WBC 5.0 11/15/2016 1018   RBC 3.90 11/15/2016 1018   HGB 11.8 (L) 11/15/2016 1018   HGB 12.9 11/22/2013 0940   HCT 34.8 (L) 11/15/2016 1018   HCT 33.9 (L) 05/22/2016 0000   PLT 121.0 (L) 11/15/2016 1018   PLT 142 (L) 05/22/2016 0000   MCV 89.4 11/15/2016 1018   MCV 88 05/22/2016 0000   MCV 91 11/22/2013 0940   MCH 29.1 05/22/2016 0000   MCH 27.2 05/03/2016 1231   MCHC 33.8 11/15/2016 1018   RDW 14.2 11/15/2016 1018   RDW 16.2 (H) 05/22/2016 0000   RDW 13.7 11/22/2013 0940   LYMPHSABS 1.7 11/15/2016 1018   LYMPHSABS  1.7 11/22/2013 0940   MONOABS 0.3 11/15/2016 1018   MONOABS 0.3 11/22/2013 0940   EOSABS 0.1 11/15/2016 1018   EOSABS 0.1 11/22/2013 0940   BASOSABS 0.0 11/15/2016 1018   BASOSABS 0.0 11/22/2013 0940   Lab Results  Component Value Date   INR 1.3 (H) 11/15/2016   INR 1.29 05/03/2016   INR 1.3 (H) 05/03/2016   CT ABDOMEN WITHOUT AND WITH CONTRAST   TECHNIQUE: Multidetector CT imaging of the abdomen was performed following the standard protocol before and following the bolus administration of intravenous contrast.   CONTRAST:  186mL ISOVUE-300 IOPAMIDOL (ISOVUE-300) INJECTION 61%   COMPARISON:  Chest  CT of 02/05/2016. Most recent abdominal CT of 10/27/2015.   FINDINGS: Lower chest: Subpleural nodularity at the right lung base anteriorly is unchanged and likely related to subpleural lymph nodes. Mild centrilobular emphysema. Mild cardiomegaly, without pericardial effusion.   Hepatobiliary: Similar size and distribution of innumerable fluid density liver lesions. No suspicious lesion identified.   The gallbladder is difficult to delineate secondary to the multiple hepatic cystic lesions. Favored to be identified on image 35/ series 7. No surrounding inflammation or biliary duct dilatation.   Pancreas: Normal, without mass or ductal dilatation.   Spleen: Normal in size, without focal abnormality.   Adrenals/Urinary Tract: Normal adrenal glands. Small fluid density renal lesions are likely cysts. No hydronephrosis. There are left renal sinus cysts.   Stomach/Bowel: Normal stomach, without wall thickening. Normal abdominal bowel loops.   Vascular/Lymphatic: Tortuous atherosclerotic abdominal aorta. No retroperitoneal or retrocrural adenopathy.   Other: No ascites.   Musculoskeletal: Convex right lumbar spine curvature. Multiple vertebral hemangiomas. Advanced lumbar spondylosis.   IMPRESSION: 1. Similar appearance of multiple fluid density liver lesions which are likely cysts and bile duct hamartomas. No suspicious liver lesion identified. 2.  No acute abdominal process.     Electronically Signed   By: Abigail Miyamoto M.D.   On: 05/10/2016 15:37          Assessment & Plan:  74 year old female with history of multiple hepatic cysts (peliosis hepatis), A. fib status post Maze procedure, history of heart failure use here for follow-up.  1. Peliosis hepatis -- cross-sectional imaging has proven the cysts are stable. There is no evidence of advanced liver disease or cirrhosis. Her liver enzymes have been normal. Reassurance provided. I recommended annual follow-up and  regular monitoring of liver enzymes going forward. This can be per primary care in between visits with me.  15 minutes spent with the patient today. Greater than 50% was spent in counseling and coordination of care with the patient

## 2016-11-30 ENCOUNTER — Other Ambulatory Visit: Payer: Self-pay | Admitting: Physician Assistant

## 2016-12-02 DIAGNOSIS — D649 Anemia, unspecified: Secondary | ICD-10-CM

## 2016-12-02 HISTORY — DX: Anemia, unspecified: D64.9

## 2016-12-02 IMAGING — MG MM DIGITAL SCREENING BILAT W/ TOMO W/ CAD
8 of 12 series · 8 of 28 positions shown · non-contrast
Comparison: Previous exam(s).

CLINICAL DATA: Screening.

EXAM:
2D DIGITAL SCREENING BILATERAL MAMMOGRAM WITH CAD AND ADJUNCT TOMO

[L MLO synth-2D]
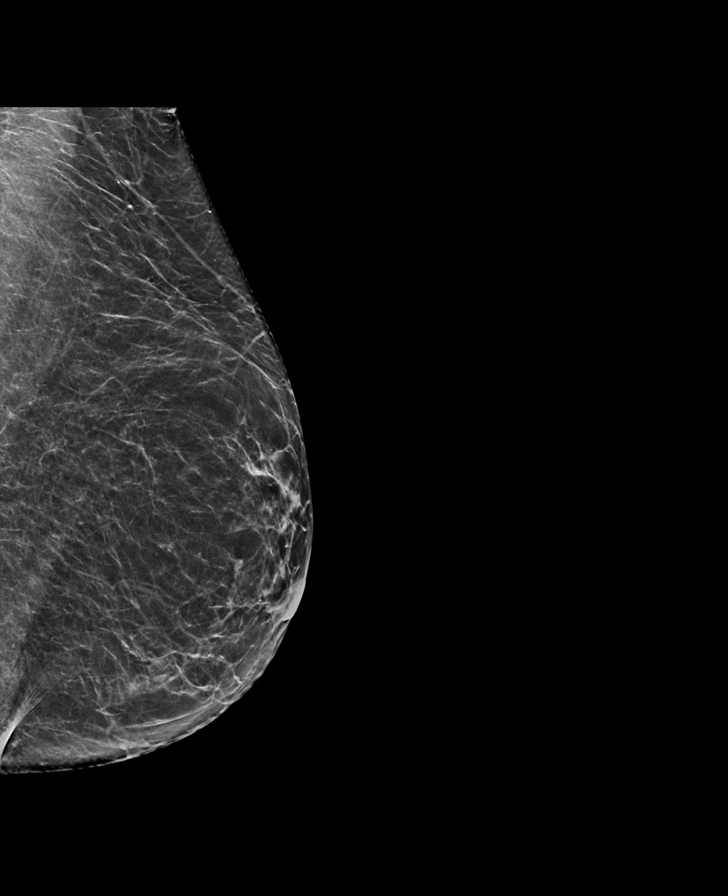

[L CC]
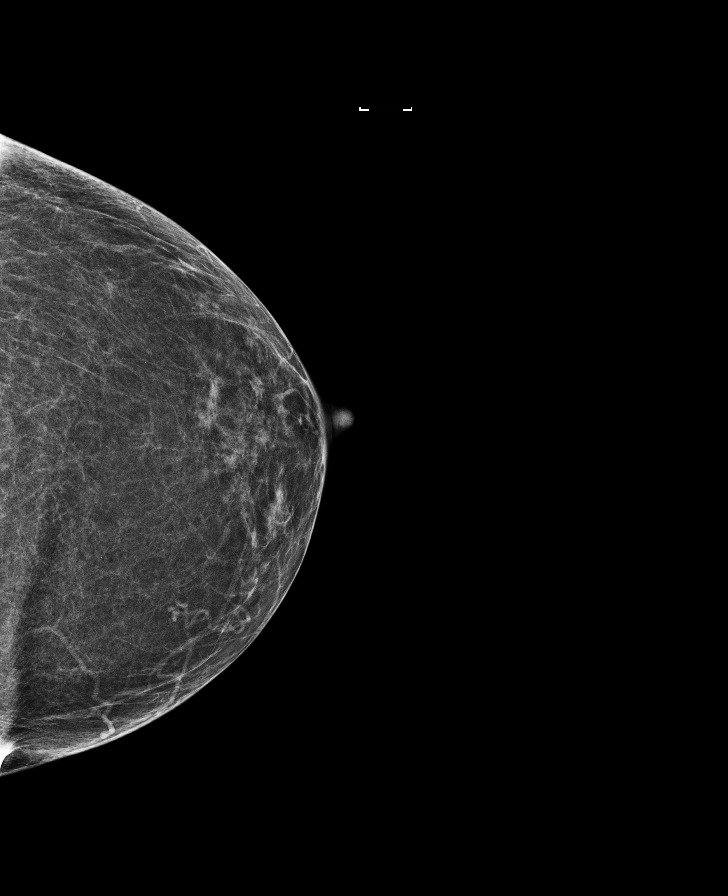

[L CC synth-2D]
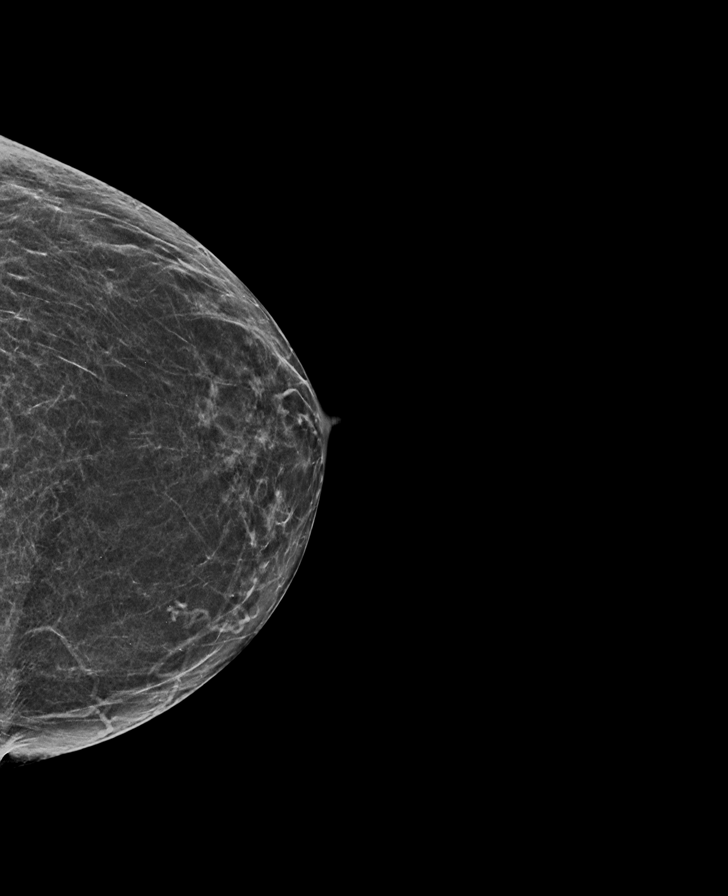

[R MLO]
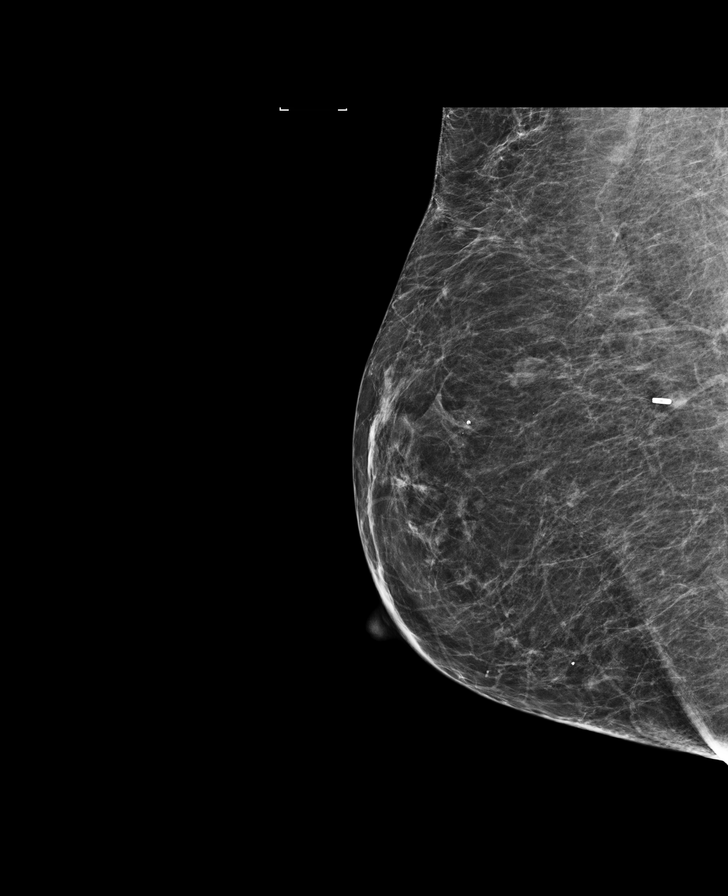

[L MLO]
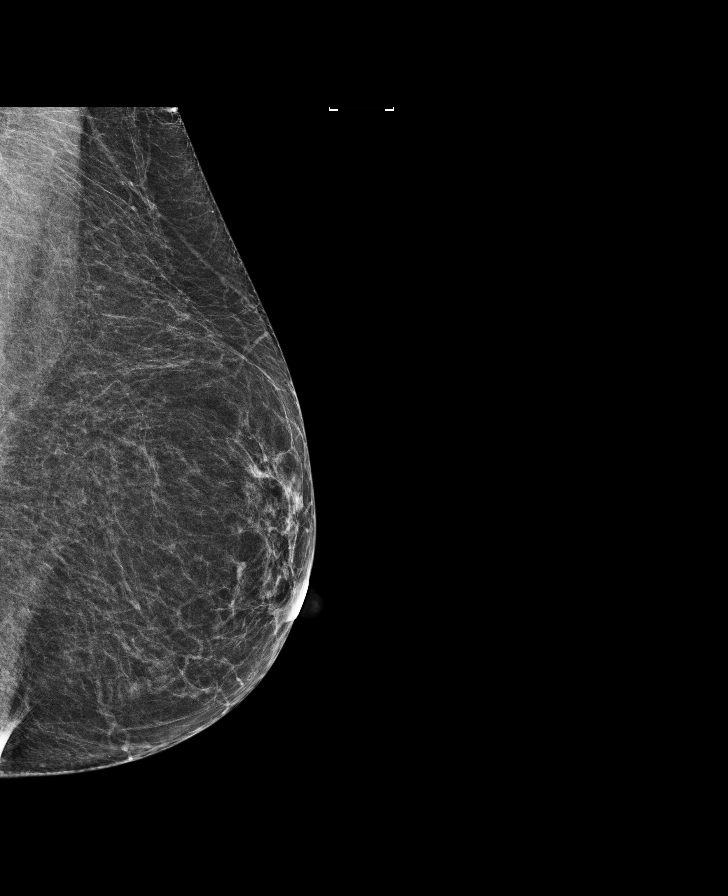

[R CC synth-2D]
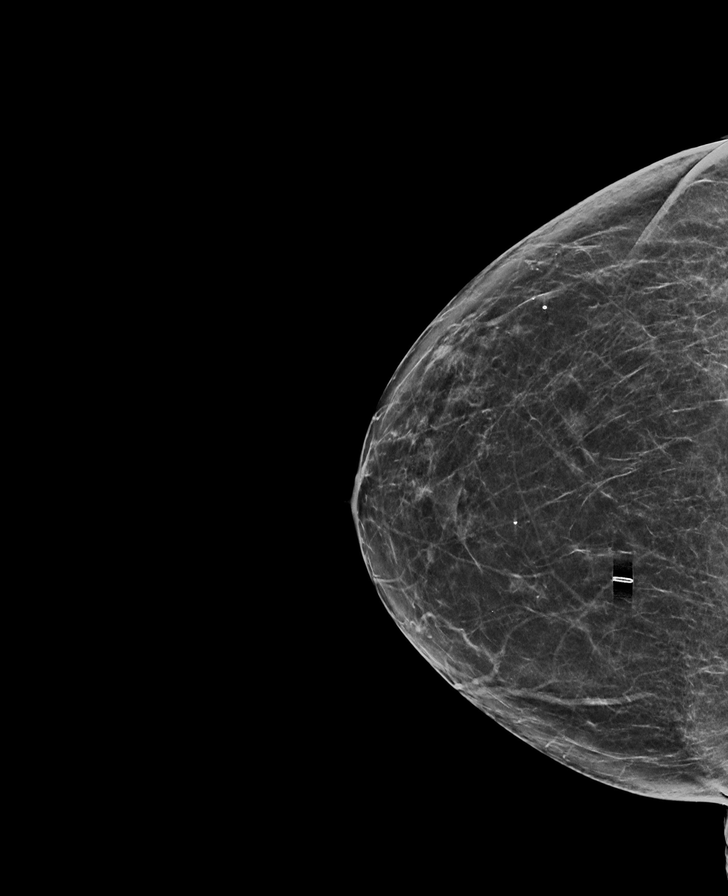

[R CC]
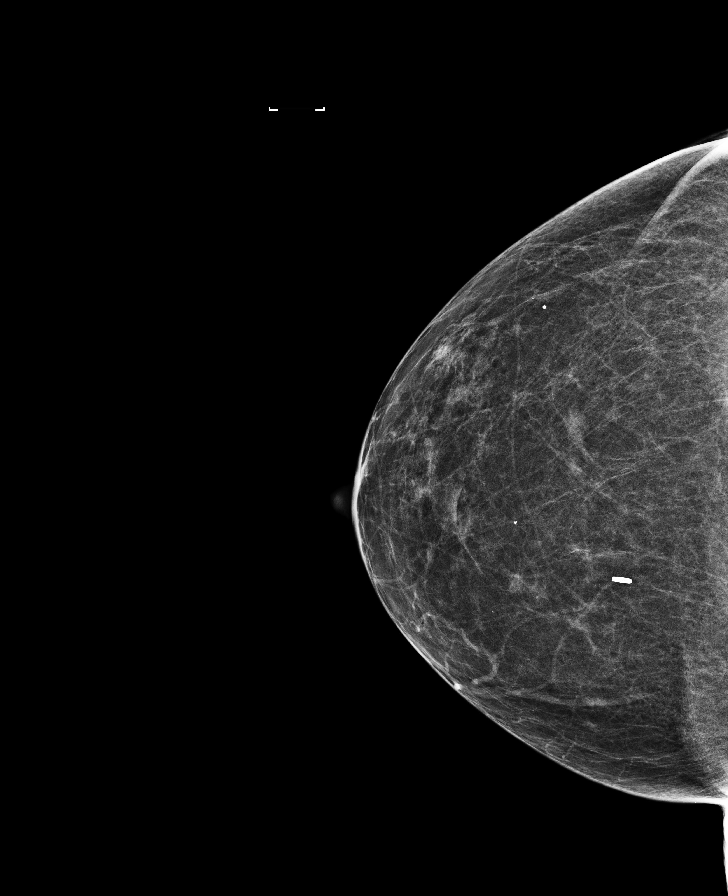

[R MLO synth-2D]
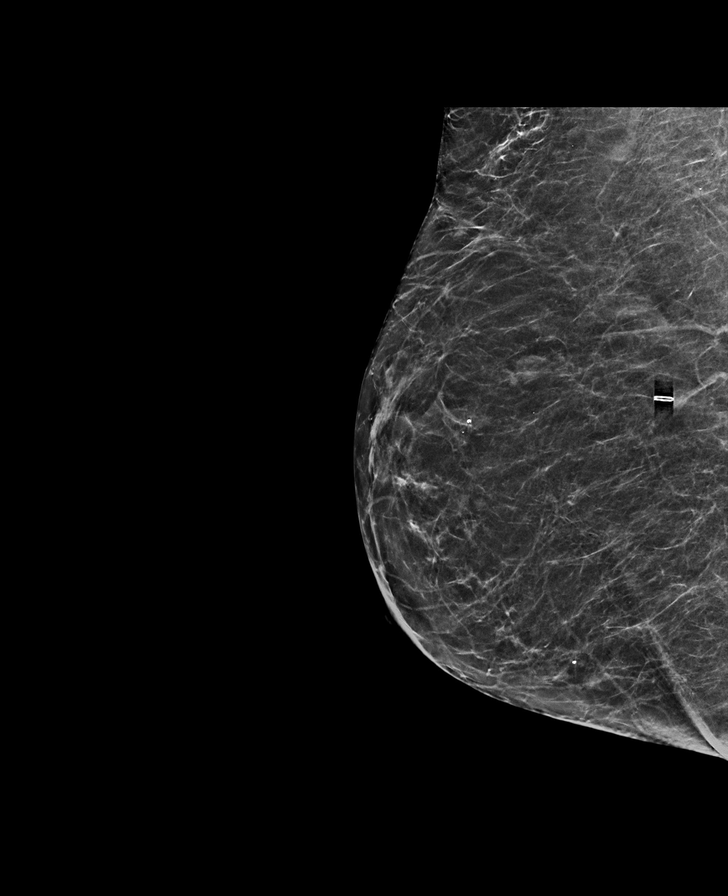

[8 of 28 positions shown; findings below may reference images not displayed]

ACR Breast Density Category b: There are scattered areas of
fibroglandular density.
FINDINGS: There are no findings suspicious for malignancy. Images were
processed with CAD.
IMPRESSION: No mammographic evidence of malignancy. A result letter of this
screening mammogram will be mailed directly to the patient.

RECOMMENDATION:
Screening mammogram in one year. (Code:97-6-RS4)

BI-RADS CATEGORY  1: Negative.

## 2016-12-03 NOTE — Telephone Encounter (Signed)
Okay to refill? Please advise. Thanks, MI 

## 2016-12-04 NOTE — Telephone Encounter (Signed)
Yes totally fine. Thanks

## 2016-12-30 ENCOUNTER — Other Ambulatory Visit: Payer: Self-pay | Admitting: Cardiovascular Disease

## 2017-01-26 ENCOUNTER — Encounter: Payer: Self-pay | Admitting: Cardiovascular Disease

## 2017-01-27 NOTE — Telephone Encounter (Signed)
Called patient in response to her advice request. She states she has felt weak intermittently for the past 3 weeks. She states she initially thought she was coming down with something viral but symptoms of weakness persist. States she was shopping on Saturday and was so fatigued after a couple of hours that she went home and slept for 3 hours. She states she is diligent about exercising 3 times per week on the elliptical, treadmill and bike and exercises hard enough to get her heart rate up to 100-120 bpm but yet she gets fatigued walking into and out of the gym. Her BP has been borderline low for some time. I advised her that Dr. Acie Fredrickson would like to evaluate her in the office and scheduled her for office visit tomorrow, 2/27.  She thanked me for the call.

## 2017-01-28 ENCOUNTER — Ambulatory Visit (INDEPENDENT_AMBULATORY_CARE_PROVIDER_SITE_OTHER): Payer: Medicare Other | Admitting: Cardiovascular Disease

## 2017-01-28 ENCOUNTER — Encounter: Payer: Self-pay | Admitting: Cardiovascular Disease

## 2017-01-28 VITALS — BP 120/78 | HR 88 | Ht 65.0 in | Wt 135.8 lb

## 2017-01-28 DIAGNOSIS — Z952 Presence of prosthetic heart valve: Secondary | ICD-10-CM | POA: Diagnosis not present

## 2017-01-28 DIAGNOSIS — R0600 Dyspnea, unspecified: Secondary | ICD-10-CM | POA: Insufficient documentation

## 2017-01-28 DIAGNOSIS — R0609 Other forms of dyspnea: Secondary | ICD-10-CM | POA: Diagnosis not present

## 2017-01-28 DIAGNOSIS — I5022 Chronic systolic (congestive) heart failure: Secondary | ICD-10-CM | POA: Diagnosis not present

## 2017-01-28 NOTE — Patient Instructions (Signed)
Medication Instructions:  Your physician recommends that you continue on your current medications as directed. Please refer to the Current Medication list given to you today.   Labwork: None Ordered   Testing/Procedures: Your physician has requested that you have an echocardiogram. Echocardiography is a painless test that uses sound waves to create images of your heart. It provides your doctor with information about the size and shape of your heart and how well your heart's chambers and valves are working. This procedure takes approximately one hour. There are no restrictions for this procedure.    Follow-Up: Your physician recommends that you schedule a follow-up appointment in: 3 months with Dr. Nahser.    If you need a refill on your cardiac medications before your next appointment, please call your pharmacy.   Thank you for choosing [facility]! Christionna Poland, RN [phone]    

## 2017-01-28 NOTE — Progress Notes (Signed)
[name] [name] Date of Birth  [birth year]       [facility] Office [address], [place], [place]  [address]   [place], [place]  [address] [phone]     [phone]   Fax  [phone]    Fax [phone]  Problem List: 1. Supraventricular tachycardia 2. Mitral valve prolapse - s/p mitral valve replacement  3. Cataracts 4.     [name] [name] is a  76 yo with hx of SVT and MVP.   She has done well.  She has not been exercising as much as she would like.  She has not had any syncope or presyncope.  She has been taking care of her granddaughter who has ADD and a sleep disorder.  [name] [name] did not get much sleep that weekend and had lots of palpitations for the following week.  Oct. 7, 2014  [name] [name] is seen after a 1 1/2 year absence.  She has had some fatigue.   Dr. Derrel Nip has found thrombocytopenia.  Crestor was stopped but this did not help the thrombocytopenia.   She has seen a hemotologist.    She fell  (caught her toe at a [employer] - not syncope) several months ago and had reconstruction of her jaw.  She also broke her left wrist.     Oct. 2, 2015 :  [name] [name] is doing well from a cardiac standpoint.  Her cholesterol levels are slightly elevated ( even on Crestor 5 mg a day).  She has had marginally low platelets .  Her cholesterol levels are still minimally elevated.    She had some dyspnea this summer.  Currently , she has no limitations.  She thinks the symptoms may have been due to allergies.  No chest pain.      She thinks that her stamina is not as good as it should be.  She does yoga but no cardio workouts.    Sept 6, 2016:  [name] [name] is seen today for a recent episode of paroxysmal atrial fib August 21 was shopping , felt very weak .   Could not walk to her car.  Very short of breath .  She went to [place] a day or so later.  Gradually had worsening chest tightness, allergy, weakness.  Thought it was allergies.    Had significant leg swelling .   She went to the hospital. She was found have rapid atrial fibrillation with evidence of congestive heart failure. Her echocardiogram showed normal left ventricle systolic function. She has mild mitral regurgitation. Was significant calcification of the mitral valve annulus. She was started on Lovenox. She's not received Coumadin at this point.  She was started on diltiazem and Lasix.  Was started on Lovenox ( because of the atrial fib in the setting of MR and itral valve calcification .   She seems to be doing quite a lot at her. She's been going to doctors appointments and has been walking across the parking lot  without any CP with problems.  Sept. 13, 2016:  [name] [name] is seen back for a 1 week visit. She was found to have rapid atrial fib last week Had some dyspnea last night - better with a Xopenex sample inhaler.  Feels better on the Eliquis and Metoprolol .  Feels more relaxed.  Cannot tell if she is in atrial fib Has low energy .  Difficult to walk to the mailbox without extreme fatigue ,  not specifically short of breath   Oct. 11, 2016:  Was cardioverted several weeks ago.  Feeling better every day since that   Nov. 10, 2016:  Has done well.  Steady improvement since  the cardioversion.  Then for the past several days/ weeks has not been doing quite a good Has shortness of breath with showering and doing everyday activities.    Feb. 10, 2017:  Is doing well - has had Mitral valve replacement and MAZE procedure since I last saw her  Just this past week, she felt much better.   Has been fatigued but now has more energetic . Has gain  Apr 16, 2016:  [name] [name] has had an atrial pacer lead revision since I last saw her. Feels "a bit off since last Thursday " Was bending over to prune some bushes.  Has not felt right since that time  Goes to the gym Geophysicist/field seismologist)  Feels "odd" when she bends over.  Has been anxious recently   Sept. 18,  2017:  [name] [name] is seen today for follow up visit Doing well. No cardiac complaints.   Feb. 27, 2018:  [name] [name] presents today for evaluation of leg weakness. She has trouble walking from her car to the cardiac rehab but then is able to exercise on the treadmill for 30 minutes without any problems .   Not in atrial fib  Does not take the lasix on a regular basis      Current Outpatient Prescriptions on File Prior to Visit  Medication Sig Dispense Refill  . albuterol (PROVENTIL HFA;VENTOLIN HFA) 108 (90 BASE) MCG/ACT inhaler Inhale 2 puffs into the lungs every 6 (six) hours as needed for wheezing or shortness of breath. 1 Inhaler 2  . ALPRAZolam (XANAX) 0.25 MG tablet Take 1 tablet (0.25 mg total) by mouth at bedtime as needed for anxiety or sleep. 30 tablet 5  . amoxicillin (AMOXIL) 500 MG capsule TAKE 4 CAPSULES BY MOUTH 60 MINUTES PRIOR TO DENTAL WORK 30 capsule 0  . apixaban (ELIQUIS) 5 MG TABS tablet Take 1 tablet (5 mg total) by mouth 2 (two) times daily. 180 tablet 3  . azelastine (ASTELIN) 0.1 % nasal spray Place 1 spray into both nostrils as needed for allergies.   5  . b complex vitamins tablet Take 1 tablet by mouth daily.    . butalbital-acetaminophen-caffeine (FIORICET, ESGIC) 50-325-40 MG tablet TAKE 1 TABLET BY MOUTH AS NEEDED FOR BACK PAIN OR HEADACHE 90 tablet 2  . Cholecalciferol (VITAMIN D) 2000 units CAPS Take 2,000 Units by mouth daily.    . Coenzyme Q10 (CO Q 10) 100 MG CAPS Take 200 mg by mouth daily.     . furosemide (LASIX) 40 MG tablet Take 40 mg by mouth as directed.    . metoprolol tartrate (LOPRESSOR) 25 MG tablet TAKE 1/2 TABLET(12.5 MG) BY MOUTH TWICE DAILY 90 tablet 1  . montelukast (SINGULAIR) 10 MG tablet Take 10 mg by mouth at bedtime.     . rosuvastatin (CRESTOR) 40 MG tablet Take 1 tablet (40 mg total) by mouth daily. 90 tablet 1   No current facility-administered medications on file prior to visit.     Allergies  Allergen Reactions  . Meloxicam  Other (See Comments)    Severe reflux  . Codeine Nausea And Vomiting    migraine  . Doxycycline Itching and Swelling    Facial  . Hydrocodone Nausea And Vomiting    MIGRAINE  . Hydromorphone Nausea And Vomiting  migraine  . Oxycodone Nausea And Vomiting    Severe migraine    Past Medical History:  Diagnosis Date  . Acute on chronic diastolic (congestive) heart failure   . Anxiety associated with depression    Prn alprazolam   . Atrial fibrillation, persistent ([place])    DCCV 08/22/2015  . Bradycardia post-op bradycardia, pacer dependent   MDT PPM 11/06/15, Dr. Lovena Le  . Diverticulosis   . Hemorrhoid   . Hepatic cyst    innumerable  . 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 Hyperlipidemia   . IBS (irritable bowel syndrome)   . Idiopathic thrombocytopenic purpura (ITP)   . Migraine   . MVP (mitral valve prolapse)   . Nodule of right lung   . RBBB   . Restrictive lung disease    Mild on PFT & likely cardiac in etiology   . S/P Minimally invasive maze operation for atrial fibrillation 10/31/2015   Complete bilateral atrial lesion set using cryothermy and bipolar radiofrequency ablation with clipping of LA appendage via right mini thoracotomy approach  . 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 Severe mitral regurgitation   . SVT (supraventricular tachycardia) ([place])   . Thoracic aorta atherosclerosis ([place])   . TIA (transient ischemic attack)     Past Surgical History:  Procedure Laterality Date  . BREAST BIOPSY Right Late 80s   Negative  . CARDIAC CATHETERIZATION N/A 10/18/2015   Procedure: Right/Left Heart Cath and Coronary Angiography;  Surgeon: Sherren Mocha, MD;  Location: [place] [facility];  Service: Cardiovascular;  Laterality: N/A;  . CARDIOVERSION N/A 08/22/2015   Procedure: CARDIOVERSION;  Surgeon: Thayer Headings,  MD;  Location: [place];  Service: Cardiovascular;  Laterality: N/A;  . COLONOSCOPY  2003  . EP IMPLANTABLE DEVICE N/A 11/06/2015   Procedure: Pacemaker Implant;  Surgeon: Evans Lance, MD;  Location: [place] [facility];  Service: Cardiovascular;  Laterality: N/A;  . EP IMPLANTABLE DEVICE N/A 02/20/2016   Procedure: Lead Extraction;  Surgeon: Evans Lance, MD;  Location: [place] [facility];  Service: Cardiovascular;  Laterality: N/A;  . EYE SURGERY Right 2013  . FOOT SURGERY    . MANDIBLE FRACTURE SURGERY  03-26-13  . MINIMALLY INVASIVE MAZE PROCEDURE N/A 10/31/2015   Procedure: MINIMALLY INVASIVE MAZE PROCEDURE;  Surgeon: Rexene Alberts, MD;  Location: [place];  Service: Open Heart Surgery;  Laterality: N/A;  . MITRAL VALVE REPLACEMENT Right 10/31/2015   Procedure: MINIMALLY INVASIVE MITRAL VALVE (MV) REPLACEMENT;  Surgeon: Rexene Alberts, MD;  Location: [place];  Service: Open Heart Surgery;  Laterality: Right;  . PACEMAKER LEAD REMOVAL  02/20/2016  . TEE WITH CARDIOVERSION    . TEE WITHOUT CARDIOVERSION N/A 08/22/2015   Procedure: TRANSESOPHAGEAL ECHOCARDIOGRAM (TEE);  Surgeon: Thayer Headings, MD;  Location: [place];  Service: Cardiovascular;  Laterality: N/A;  . TEE WITHOUT CARDIOVERSION N/A 10/31/2015   Procedure: TRANSESOPHAGEAL ECHOCARDIOGRAM (TEE);  Surgeon: Rexene Alberts, MD;  Location: [facility];  Service: Open Heart Surgery;  Laterality: N/A;  . TUBAL LIGATION    . VARICOSE VEIN SURGERY Right     History  Smoking Status  . Never Smoker  Smokeless Tobacco  . Never Used    History  Alcohol Use No    Family History  Problem Relation Age of Onset  . Hypertension Mother   . Arrhythmia Mother   .  Heart failure Mother   . Arrhythmia Brother   . Stroke Brother 33    cerebral hemorrhage, nonsmoker, no HTN  . Prostate cancer Brother   . Stroke Father     from an aneurysm  . Stroke Maternal Aunt 83    cerebral hemorrhage  . Liver cancer Maternal Grandmother   .  Atrial fibrillation Son   . Celiac disease Son   . Heart attack Neg Hx   . Breast cancer Neg Hx     Reviw of Systems:  Reviewed in the HPI.  All other systems are negative.  Physical Exam: Blood pressure 120/78, pulse 88, height 5\' 5"  (1.651 m), weight 135 lb 12 oz (61.6 kg), SpO2 98 %. General: Well developed, well nourished, in no acute distress. Head: Normocephalic, atraumatic, sclera non-icteric, mucus membranes are moist,  Neck: Supple. Carotids are 2 + without bruits. No JVD Lungs: Clear bilaterally to auscultation. Heart:  RR with occasional premature beats , chest is non tender  Abdomen: Soft, non-tender, non-distended with normal bowel sounds. No hepatomegaly. No rebound/guarding. No masses. Msk:  Strength and tone are normal Extremities: No clubbing or cyanosis. No edema.  Distal pedal pulses are 2+ and equal bilaterally. Neuro: Alert and oriented X 3. Moves all extremities spontaneously. Psych:  Normal   ECG: AV pacing at 88   Assessment / Plan:   1. Dyspnea with exertion :  Symptoms are puzzling .  She is able to walk in the treadmill but has DOE walking around outside . No Cp,  No arrhythmias. Some days are better than others  Not related to the timing of taking her meds. Or a certain time of the day .  We'll check an echocardiogram. I would like for her to walk outside more and see if she can gradually improve her endurance.   2. Persistent Atrial fibrillation with rapid ventricular response: she is maintaining NSR.  She status post MAZE procedure . She is still on Eliquis . She has had a TIA and wants to continue on Eliquis.  Will continue Eliquis for now .   3.  MVP with MR:  Now status post mitral valve replacement. She is doing quite well. She is getting over anesthesia and her energy levels have improved.  4. Congestive heart failure: her CHF symptoms have greatly improved. Will repeat the echo to make sure her EF is stable   5. Pacemaker: She status  post pacemaker implantation. Has not had any recent mode switches.  6. Hyperlipidemia:   Lipids look great.      Mertie Moores, MD  01/28/2017 11:05 AM    [address],  [place],   [address] Pager [phone] Phone: [phone]; Fax: [phone]

## 2017-02-06 ENCOUNTER — Telehealth: Payer: Self-pay | Admitting: Radiology

## 2017-02-06 DIAGNOSIS — E559 Vitamin D deficiency, unspecified: Secondary | ICD-10-CM

## 2017-02-06 DIAGNOSIS — E7800 Pure hypercholesterolemia, unspecified: Secondary | ICD-10-CM

## 2017-02-06 NOTE — Telephone Encounter (Signed)
Yes, thanks

## 2017-02-06 NOTE — Telephone Encounter (Signed)
See prev note. Ok to use different provider orders for cmet, lipid, and cbc?

## 2017-02-06 NOTE — Telephone Encounter (Signed)
There are duplicate  cmets ordered  She needs lipids, cmet , vit d and cbc   Lab Results  Component Value Date   HGBA1C 5.6 08/12/2016

## 2017-02-06 NOTE — Addendum Note (Signed)
Addended by: [name] on: 02/06/2017 12:39 PM   Modules accepted: Orders

## 2017-02-06 NOTE — Telephone Encounter (Signed)
Pt coming in for labs on Monday and has no future orders placed from you. Only orders that are future are from another doctor. Please place future orders if you need labs drawn. Thank you.

## 2017-02-10 ENCOUNTER — Telehealth: Payer: Self-pay | Admitting: *Deleted

## 2017-02-10 ENCOUNTER — Other Ambulatory Visit (INDEPENDENT_AMBULATORY_CARE_PROVIDER_SITE_OTHER): Payer: Medicare Other

## 2017-02-10 DIAGNOSIS — R7301 Impaired fasting glucose: Secondary | ICD-10-CM | POA: Diagnosis not present

## 2017-02-10 DIAGNOSIS — E559 Vitamin D deficiency, unspecified: Secondary | ICD-10-CM | POA: Diagnosis not present

## 2017-02-10 DIAGNOSIS — K589 Irritable bowel syndrome without diarrhea: Secondary | ICD-10-CM | POA: Diagnosis not present

## 2017-02-10 DIAGNOSIS — E785 Hyperlipidemia, unspecified: Secondary | ICD-10-CM

## 2017-02-10 LAB — CBC WITH DIFFERENTIAL/PLATELET
Basophils Absolute: 0 10*3/uL (ref 0.0–0.1)
Basophils Relative: 0.9 % (ref 0.0–3.0)
Eosinophils Absolute: 0.1 10*3/uL (ref 0.0–0.7)
Eosinophils Relative: 2.1 % (ref 0.0–5.0)
HCT: 27.1 % — ABNORMAL LOW (ref 36.0–46.0)
Hemoglobin: 9 g/dL — ABNORMAL LOW (ref 12.0–15.0)
Lymphocytes Relative: 38.2 % (ref 12.0–46.0)
Lymphs Abs: 1.4 10*3/uL (ref 0.7–4.0)
MCHC: 33.1 g/dL (ref 30.0–36.0)
MCV: 84.1 fl (ref 78.0–100.0)
Monocytes Absolute: 0.3 10*3/uL (ref 0.1–1.0)
Monocytes Relative: 8.3 % (ref 3.0–12.0)
Neutro Abs: 1.8 10*3/uL (ref 1.4–7.7)
Neutrophils Relative %: 50.5 % (ref 43.0–77.0)
Platelets: 112 10*3/uL — ABNORMAL LOW (ref 150.0–400.0)
RBC: 3.22 Mil/uL — ABNORMAL LOW (ref 3.87–5.11)
RDW: 14.2 % (ref 11.5–15.5)
WBC: 3.6 10*3/uL — ABNORMAL LOW (ref 4.0–10.5)

## 2017-02-10 LAB — LIPID PANEL
Cholesterol: 138 mg/dL (ref 0–200)
HDL: 59.4 mg/dL (ref >39.00)
LDL Cholesterol: 69 mg/dL (ref 0–99)
NonHDL: 78.25
Total CHOL/HDL Ratio: 2
Triglycerides: 45 mg/dL (ref 0.0–149.0)
VLDL: 9 mg/dL (ref 0.0–40.0)

## 2017-02-10 LAB — COMPREHENSIVE METABOLIC PANEL
ALT: 17 U/L (ref 0–35)
AST: 20 U/L (ref 0–37)
Albumin: 4 g/dL (ref 3.5–5.2)
Alkaline Phosphatase: 52 U/L (ref 39–117)
BUN: 21 mg/dL (ref 6–23)
CO2: 31 mEq/L (ref 19–32)
Calcium: 9.2 mg/dL (ref 8.4–10.5)
Chloride: 108 mEq/L (ref 96–112)
Creatinine, Ser: 0.86 mg/dL (ref 0.40–1.20)
GFR: 68.13 mL/min (ref >60.00)
Glucose, Bld: 94 mg/dL (ref 70–99)
Potassium: 4.1 mEq/L (ref 3.5–5.1)
Sodium: 143 mEq/L (ref 135–145)
Total Bilirubin: 0.3 mg/dL (ref 0.2–1.2)
Total Protein: 6 g/dL (ref 6.0–8.3)

## 2017-02-10 NOTE — Telephone Encounter (Signed)
Thanks

## 2017-02-10 NOTE — Telephone Encounter (Signed)
Lab Results  Component Value Date   HGBA1C 5.6 08/12/2016   Ordered,  thanks

## 2017-02-10 NOTE — Addendum Note (Signed)
Addended by: Leeanne Rio on: 02/10/2017 08:03 AM   Modules accepted: Orders

## 2017-02-10 NOTE — Addendum Note (Signed)
Addended by: Leeanne Rio on: 02/10/2017 09:58 AM   Modules accepted: Orders

## 2017-02-10 NOTE — Telephone Encounter (Signed)
Pt came in this morning for labs & asked if an A1C was being checked as well. She states that is has been 6 months since it was last checked. If okay to add, please place "future" order.

## 2017-02-11 LAB — VITAMIN D 25 HYDROXY (VIT D DEFICIENCY, FRACTURES): VITD: 55.04 ng/mL (ref 30.00–100.00)

## 2017-02-11 LAB — HEMOGLOBIN A1C: Hgb A1c MFr Bld: 5.7 % (ref 4.6–6.5)

## 2017-02-12 ENCOUNTER — Other Ambulatory Visit: Payer: Self-pay

## 2017-02-12 ENCOUNTER — Ambulatory Visit (HOSPITAL_COMMUNITY): Payer: Medicare Other | Attending: Cardiovascular Disease

## 2017-02-12 ENCOUNTER — Other Ambulatory Visit: Payer: Medicare Other

## 2017-02-12 DIAGNOSIS — I509 Heart failure, unspecified: Secondary | ICD-10-CM | POA: Insufficient documentation

## 2017-02-12 DIAGNOSIS — E785 Hyperlipidemia, unspecified: Secondary | ICD-10-CM | POA: Insufficient documentation

## 2017-02-12 DIAGNOSIS — I371 Nonrheumatic pulmonary valve insufficiency: Secondary | ICD-10-CM | POA: Diagnosis not present

## 2017-02-12 DIAGNOSIS — I272 Pulmonary hypertension, unspecified: Secondary | ICD-10-CM | POA: Insufficient documentation

## 2017-02-12 DIAGNOSIS — D649 Anemia, unspecified: Secondary | ICD-10-CM

## 2017-02-12 DIAGNOSIS — R7303 Prediabetes: Secondary | ICD-10-CM | POA: Insufficient documentation

## 2017-02-12 DIAGNOSIS — I071 Rheumatic tricuspid insufficiency: Secondary | ICD-10-CM | POA: Diagnosis not present

## 2017-02-12 DIAGNOSIS — I4891 Unspecified atrial fibrillation: Secondary | ICD-10-CM | POA: Diagnosis not present

## 2017-02-12 DIAGNOSIS — I341 Nonrheumatic mitral (valve) prolapse: Secondary | ICD-10-CM | POA: Diagnosis not present

## 2017-02-12 DIAGNOSIS — Z8673 Personal history of transient ischemic attack (TIA), and cerebral infarction without residual deficits: Secondary | ICD-10-CM | POA: Diagnosis not present

## 2017-02-12 DIAGNOSIS — Z952 Presence of prosthetic heart valve: Secondary | ICD-10-CM | POA: Insufficient documentation

## 2017-02-12 DIAGNOSIS — I4510 Unspecified right bundle-branch block: Secondary | ICD-10-CM | POA: Insufficient documentation

## 2017-02-12 DIAGNOSIS — Z950 Presence of cardiac pacemaker: Secondary | ICD-10-CM | POA: Diagnosis not present

## 2017-02-12 DIAGNOSIS — R0609 Other forms of dyspnea: Secondary | ICD-10-CM

## 2017-02-12 DIAGNOSIS — R0600 Dyspnea, unspecified: Secondary | ICD-10-CM

## 2017-02-14 ENCOUNTER — Encounter: Payer: Self-pay | Admitting: Internal Medicine

## 2017-02-17 ENCOUNTER — Encounter: Payer: Self-pay | Admitting: Internal Medicine

## 2017-02-17 ENCOUNTER — Ambulatory Visit (INDEPENDENT_AMBULATORY_CARE_PROVIDER_SITE_OTHER): Payer: Medicare Other | Admitting: Internal Medicine

## 2017-02-17 VITALS — BP 102/66 | HR 82 | Resp 16 | Ht 65.0 in | Wt 136.0 lb

## 2017-02-17 DIAGNOSIS — R5383 Other fatigue: Secondary | ICD-10-CM | POA: Diagnosis not present

## 2017-02-17 DIAGNOSIS — M810 Age-related osteoporosis without current pathological fracture: Secondary | ICD-10-CM

## 2017-02-17 DIAGNOSIS — I5022 Chronic systolic (congestive) heart failure: Secondary | ICD-10-CM

## 2017-02-17 DIAGNOSIS — I700 Atherosclerosis of aorta: Secondary | ICD-10-CM | POA: Diagnosis not present

## 2017-02-17 DIAGNOSIS — R7303 Prediabetes: Secondary | ICD-10-CM

## 2017-02-17 DIAGNOSIS — D696 Thrombocytopenia, unspecified: Secondary | ICD-10-CM

## 2017-02-17 DIAGNOSIS — D649 Anemia, unspecified: Secondary | ICD-10-CM | POA: Diagnosis not present

## 2017-02-17 DIAGNOSIS — M545 Low back pain, unspecified: Secondary | ICD-10-CM

## 2017-02-17 DIAGNOSIS — G8929 Other chronic pain: Secondary | ICD-10-CM

## 2017-02-17 DIAGNOSIS — E7800 Pure hypercholesterolemia, unspecified: Secondary | ICD-10-CM

## 2017-02-17 DIAGNOSIS — D631 Anemia in chronic kidney disease: Secondary | ICD-10-CM | POA: Insufficient documentation

## 2017-02-17 LAB — FERRITIN: Ferritin: 5.8 ng/mL — ABNORMAL LOW (ref 10.0–291.0)

## 2017-02-17 LAB — VITAMIN B12: Vitamin B-12: 501 pg/mL (ref 211–911)

## 2017-02-17 LAB — IRON AND TIBC
%SAT: 4 % — ABNORMAL LOW (ref 11–50)
Iron: 14 ug/dL — ABNORMAL LOW (ref 45–160)
TIBC: 398 ug/dL (ref 250–450)
UIBC: 384 ug/dL (ref 125–400)

## 2017-02-17 LAB — RETICULOCYTES
ABS Retic: 35970 cells/uL (ref 20000–80000)
RBC.: 3.27 MIL/uL — ABNORMAL LOW (ref 3.80–5.10)
Retic Ct Pct: 1.1 %

## 2017-02-17 LAB — TSH: TSH: 3.29 u[IU]/mL (ref 0.35–4.50)

## 2017-02-17 NOTE — Progress Notes (Signed)
Pre visit review using our clinic review tool, if applicable. No additional management support is needed unless otherwise documented below in the visit note. 

## 2017-02-17 NOTE — Patient Instructions (Addendum)
The ShingRx vaccine will be available in about 6 months and IS ADVISED for all interested adults over 50 to prevent shingles   Your anemia is being worked up today   Resume one calcium supplement daily if you are not getting 1200 to 1800 mg calcium daily

## 2017-02-17 NOTE — Assessment & Plan Note (Signed)
Secondary to OA and scoliosis.Marland Kitchen  uses fioricet

## 2017-02-17 NOTE — Progress Notes (Signed)
Prolia ordered and spoke with CMA regarding this already to schedule appt, thanks

## 2017-02-17 NOTE — Progress Notes (Signed)
Subjective:  Patient ID: [name] [name], female    DOB: June 30, 1941  Age: 76 y.o. MRN: [id number]  CC: The primary encounter diagnosis was Anemia, unspecified type. Diagnoses of Fatigue, unspecified type, Chronic left-sided low back pain without sciatica, Prediabetes, Osteoporosis of forearm, Thrombocytopenia (HCC), Chronic systolic congestive heart failure [facility]), Thoracic aorta atherosclerosis ([place]), and Pure hypercholesterolemia were also pertinent to this visit.  HPI [name] [name] presents for follow up on multiple issues including prediabetes and worsening anemia   She reports that she  Is feeling Fatigue and generalized  weakness that started in feb after two relatively good months..she had been diligent about exercising for  60 minutes mon wed and fri using a treadmill and elliptical, but recently found that she was no longer able to to even start the routine .  She continues to attend cardiopulmonary rehab and recently was found to be hypotensive.   bp was measured as 80/50 during recent session.   Saw Cardiology ECHO  repeated,  EF 40 to 45%  ekg:  paced rhythm  Rate 88  Saw GI,  significant anemia noted on screening labs.  She has dropped off an FOBT last Friday which is still pending.   She has been following a gluten free diet which has made a positive effect on her digestion  and stool frequency (going off it makes her constipated)  Supplements:  Taking  Vitamins D,  B complex and CoQ10 .  Has not taken calcium since the heart cath despite osteoporosis,   Prior Colonoscopy 2015: diverticulosis   Lab Results  Component Value Date   WBC 3.6 (L) 02/10/2017   HGB 9.0 (L) 02/10/2017   HCT 27.1 (L) 02/10/2017   MCV 84.1 02/10/2017   PLT 112.0 (L) 02/10/2017   Lab Results  Component Value Date   HGBA1C 5.7 02/10/2017     Outpatient Medications Prior to Visit  Medication Sig Dispense Refill  . albuterol (PROVENTIL HFA;VENTOLIN HFA) 108 (90 BASE) MCG/ACT inhaler Inhale 2  puffs into the lungs every 6 (six) hours as needed for wheezing or shortness of breath. 1 Inhaler 2  . ALPRAZolam (XANAX) 0.25 MG tablet Take 1 tablet (0.25 mg total) by mouth at bedtime as needed for anxiety or sleep. 30 tablet 5  . amoxicillin (AMOXIL) 500 MG capsule TAKE 4 CAPSULES BY MOUTH 60 MINUTES PRIOR TO DENTAL WORK 30 capsule 0  . apixaban (ELIQUIS) 5 MG TABS tablet Take 1 tablet (5 mg total) by mouth 2 (two) times daily. 180 tablet 3  . azelastine (ASTELIN) 0.1 % nasal spray Place 1 spray into both nostrils as needed for allergies.   5  . b complex vitamins tablet Take 1 tablet by mouth daily.    . butalbital-acetaminophen-caffeine (FIORICET, ESGIC) 50-325-40 MG tablet TAKE 1 TABLET BY MOUTH AS NEEDED FOR BACK PAIN OR HEADACHE 90 tablet 2  . Cholecalciferol (VITAMIN D) 2000 units CAPS Take 2,000 Units by mouth daily.    . Coenzyme Q10 (CO Q 10) 100 MG CAPS Take 200 mg by mouth daily.     . furosemide (LASIX) 40 MG tablet Take 40 mg by mouth as directed.    . metoprolol tartrate (LOPRESSOR) 25 MG tablet TAKE 1/2 TABLET(12.5 MG) BY MOUTH TWICE DAILY 90 tablet 1  . montelukast (SINGULAIR) 10 MG tablet Take 10 mg by mouth at bedtime.     . rosuvastatin (CRESTOR) 40 MG tablet Take 1 tablet (40 mg total) by mouth daily. 90 tablet 1  No facility-administered medications prior to visit.     Review of Systems;  Patient denies headache, fevers, malaise, unintentional weight loss, skin rash, eye pain, sinus congestion and sinus pain, sore throat, dysphagia,  hemoptysis , cough, dyspnea, wheezing, chest pain, palpitations, orthopnea, edema, abdominal pain, nausea, melena, diarrhea, constipation, flank pain, dysuria, hematuria, urinary  Frequency, nocturia, numbness, tingling, seizures,  Focal weakness, Loss of consciousness,  Tremor, insomnia, depression, anxiety, and suicidal ideation.      Objective:  BP 102/66 (BP Location: Left Arm, Patient Position: Sitting, Cuff Size: Normal)   Pulse  82   Resp 16   Ht 5\' 5"  (1.651 m)   Wt 136 lb (61.7 kg)   SpO2 98%   BMI 22.63 kg/m   BP Readings from Last 3 Encounters:  02/17/17 102/66  01/28/17 120/78  11/22/16 94/60    Wt Readings from Last 3 Encounters:  02/17/17 136 lb (61.7 kg)  01/28/17 135 lb 12 oz (61.6 kg)  11/22/16 135 lb 6 oz (61.4 kg)    General appearance: alert, cooperative and appears stated age Ears: normal TM's and external ear canals both ears Throat: lips, mucosa, and tongue normal; teeth and gums normal Neck: no adenopathy, no carotid bruit, supple, symmetrical, trachea midline and thyroid not enlarged, symmetric, no tenderness/mass/nodules Back: symmetric, no curvature. ROM normal. No CVA tenderness. Lungs: clear to auscultation bilaterally Heart: regular rate and rhythm, S1, S2 normal, no murmur, click, rub or gallop Abdomen: soft, non-tender; bowel sounds normal; no masses,  no organomegaly Pulses: 2+ and symmetric Skin: Skin color, texture, turgor normal. No rashes or lesions Lymph nodes: Cervical, supraclavicular, and axillary nodes normal.  Lab Results  Component Value Date   HGBA1C 5.7 02/10/2017   HGBA1C 5.6 08/12/2016   HGBA1C 6.0 (H) 05/04/2016    Lab Results  Component Value Date   CREATININE 0.86 02/10/2017   CREATININE 0.85 11/15/2016   CREATININE 0.81 08/12/2016    Lab Results  Component Value Date   WBC 3.6 (L) 02/10/2017   HGB 9.0 (L) 02/10/2017   HCT 27.1 (L) 02/10/2017   PLT 112.0 (L) 02/10/2017   GLUCOSE 94 02/10/2017   CHOL 138 02/10/2017   TRIG 45.0 02/10/2017   HDL 59.40 02/10/2017   LDLDIRECT 113.0 02/15/2016   LDLCALC 69 02/10/2017   ALT 17 02/10/2017   AST 20 02/10/2017   NA 143 02/10/2017   K 4.1 02/10/2017   CL 108 02/10/2017   CREATININE 0.86 02/10/2017   BUN 21 02/10/2017   CO2 31 02/10/2017   TSH 3.29 02/17/2017   INR 1.3 (H) 11/15/2016   HGBA1C 5.7 02/10/2017    Ct Chest Wo Contrast  Result Date: 08/20/2016 CLINICAL DATA:  Followup lung  nodule EXAM: CT CHEST WITHOUT CONTRAST TECHNIQUE: Multidetector CT imaging of the chest was performed following the standard protocol without IV contrast. COMPARISON:  02/05/2016 FINDINGS: Cardiovascular: Somewhat limited by the lack of IV contrast. Diffuse aortic calcifications are noted without aneurysmal dilatation. Pacing device is noted. 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 Mediastinum/Nodes: No significant hilar or mediastinal adenopathy is noted. No axillary adenopathy is seen. Lungs/Pleura: The lungs are well aerated bilaterally. No focal infiltrate or sizable effusion is seen. Minimal scarring is noted in the bases bilaterally stable from previous exams. Stable 6 mm right lower lobe nodule is seen. No new nodules are identified. Scarring is again seen in the right middle lobe. A calcified granuloma is noted in the subpleural location best seen on image  number 71 of series 3 in the right upper lobe. This is stable from the prior exam. Upper Abdomen: Visualized upper abdomen demonstrates multiple hepatic cysts which are stable in appearance. The remainder the upper abdomen is unremarkable. Musculoskeletal: No chest wall mass or suspicious bone lesions identified. IMPRESSION: Changes of prior granulomatous disease. Stable 6 mm nodule in the right lower lobe which is noncalcified. This also likely represents a granuloma. It is stable from 9/16. Future CT at 12 months (from today's scan) is considered optional for low-risk patients, but is recommended for high-risk patients. This recommendation follows the consensus statement: Guidelines for Management of Incidental Pulmonary Nodules Detected on CT Images: From the Fleischner Society 2017; Radiology 2017; 284:228-243. Stable scarring in the right middle lobe. Electronically Signed   By: Inez Catalina M.D.   On: 08/20/2016 12:36    Assessment & Plan:   Problem List Items Addressed This Visit    Anemia - Primary   Relevant  Orders   Erythropoietin   Ferritin (Completed)   Folate RBC   Iron and TIBC   Reticulocytes   Vitamin B12 (Completed)   TSH (Completed)   Chronic systolic congestive heart failure ([place])    Recent ECHO March 2018  EF 40 to 45%       Fatigue    Multifactorial secondary to anemia and poor sleep habits aggravated by back and hip pain. ,   Lab Results  Component Value Date   HGB 9.0 (L) 02/10/2017    Lab Results  Component Value Date   VITAMINB12 501 02/17/2017   Lab Results  Component Value Date   TSH 3.29 02/17/2017   Lab Results  Component Value Date   IRON 74 04/08/2013   TIBC 296 04/08/2013   FERRITIN 5.8 (L) 02/17/2017         Hyperlipidemia (Chronic)    LDL Is at goal .  LFTS are normal. .mild thrombocytopenia was unchanged with suspension of statin.    Continue Crestor.  Lab Results  Component Value Date   CHOL 138 02/10/2017   HDL 59.40 02/10/2017   LDLCALC 69 02/10/2017   LDLDIRECT 113.0 02/15/2016   TRIG 45.0 02/10/2017   CHOLHDL 2 02/10/2017   Lab Results  Component Value Date   ALT 17 02/10/2017   AST 20 02/10/2017   ALKPHOS 52 02/10/2017   BILITOT 0.3 02/10/2017         Lumbago    Secondary to OA and scoliosis.Marland Kitchen  uses fioricet       Osteoporosis of forearm    With priro fracteures including fracture of mandible 2017 during a fall.  Her disease is managed with prolia injection very 6 months which is due in April .  Discussed the current controversies surrounding the risks and benefits of calcium supplementation.  Encouraged her to increase dietary calcium through natural foods including almond/coconut milk      Prediabetes    Managed with low GI and gluten free diet  Lab Results  Component Value Date   HGBA1C 5.7 02/10/2017          Thoracic aorta atherosclerosis (HCC) (Chronic)    Noted on previous imaging.  continue Crestor .  Taking eliquis       Thrombocytopenia ([place])    No significant change with suspension of crestor,  Med  resumed.  Lab Results  Component Value Date   WBC 3.6 (L) 02/10/2017   HGB 9.0 (L) 02/10/2017   HCT 27.1 (L) 02/10/2017   MCV 84.1  02/10/2017   PLT 112.0 (L) 02/10/2017           A total of 40 minutes was spent with patient more than half of which was spent in counseling patient on the above mentioned issues , reviewing and explaining recent labs and imaging studies done, and coordination of care. I am having Ms. Gwynne maintain her montelukast, albuterol, ALPRAZolam, azelastine, butalbital-acetaminophen-caffeine, Vitamin D, Co Q 10, b complex vitamins, apixaban, furosemide, rosuvastatin, amoxicillin, and metoprolol tartrate.  No orders of the defined types were placed in this encounter.   There are no discontinued medications.  Follow-up: Return RN visit in april, for Prolia 6 months with me .   [name], MD

## 2017-02-17 NOTE — Assessment & Plan Note (Addendum)
Multifactorial secondary to anemia and poor sleep habits aggravated by back and hip pain. ,   Lab Results  Component Value Date   HGB 9.0 (L) 02/10/2017    Lab Results  Component Value Date   VITAMINB12 501 02/17/2017   Lab Results  Component Value Date   TSH 3.29 02/17/2017   Lab Results  Component Value Date   IRON 74 04/08/2013   TIBC 296 04/08/2013   FERRITIN 5.8 (L) 02/17/2017

## 2017-02-17 NOTE — Assessment & Plan Note (Addendum)
With priro fracteures including fracture of mandible 2017 during a fall.  Her disease is managed with prolia injection very 6 months which is due in April .  Discussed the current controversies surrounding the risks and benefits of calcium supplementation.  Encouraged her to increase dietary calcium through natural foods including almond/coconut milk

## 2017-02-17 NOTE — Assessment & Plan Note (Signed)
Managed with low GI and gluten free diet  Lab Results  Component Value Date   HGBA1C 5.7 02/10/2017

## 2017-02-18 ENCOUNTER — Other Ambulatory Visit (INDEPENDENT_AMBULATORY_CARE_PROVIDER_SITE_OTHER): Payer: Medicare Other

## 2017-02-18 DIAGNOSIS — D649 Anemia, unspecified: Secondary | ICD-10-CM | POA: Diagnosis not present

## 2017-02-18 LAB — FECAL OCCULT BLOOD, IMMUNOCHEMICAL: Fecal Occult Bld: NEGATIVE

## 2017-02-18 NOTE — Assessment & Plan Note (Addendum)
Noted on previous imaging.  continue Crestor .  Taking eliquis

## 2017-02-18 NOTE — Assessment & Plan Note (Signed)
LDL Is at goal .  LFTS are normal. .mild thrombocytopenia was unchanged with suspension of statin.    Continue Crestor.  Lab Results  Component Value Date   CHOL 138 02/10/2017   HDL 59.40 02/10/2017   LDLCALC 69 02/10/2017   LDLDIRECT 113.0 02/15/2016   TRIG 45.0 02/10/2017   CHOLHDL 2 02/10/2017   Lab Results  Component Value Date   ALT 17 02/10/2017   AST 20 02/10/2017   ALKPHOS 52 02/10/2017   BILITOT 0.3 02/10/2017

## 2017-02-18 NOTE — Assessment & Plan Note (Addendum)
Recent ECHO March 2018  EF 40 to 45%

## 2017-02-18 NOTE — Assessment & Plan Note (Signed)
No significant change with suspension of crestor,  Med resumed.  Lab Results  Component Value Date   WBC 3.6 (L) 02/10/2017   HGB 9.0 (L) 02/10/2017   HCT 27.1 (L) 02/10/2017   MCV 84.1 02/10/2017   PLT 112.0 (L) 02/10/2017

## 2017-02-19 ENCOUNTER — Other Ambulatory Visit: Payer: Self-pay | Admitting: Internal Medicine

## 2017-02-19 ENCOUNTER — Encounter: Payer: Self-pay | Admitting: Internal Medicine

## 2017-02-19 LAB — FOLATE RBC: RBC Folate: 1012 ng/mL (ref >280)

## 2017-02-19 LAB — ERYTHROPOIETIN: Erythropoietin: 68.1 m[IU]/mL — ABNORMAL HIGH (ref 2.6–18.5)

## 2017-02-19 MED ORDER — FERROUS FUM-IRON POLYSACCH 162-115.2 MG PO CAPS: 1.0000 | ORAL_CAPSULE | Freq: Every day | ORAL | 1 refills | Status: DC

## 2017-02-20 ENCOUNTER — Telehealth: Payer: Self-pay | Admitting: Cardiology

## 2017-02-20 ENCOUNTER — Ambulatory Visit (INDEPENDENT_AMBULATORY_CARE_PROVIDER_SITE_OTHER): Payer: Medicare Other | Admitting: *Deleted

## 2017-02-20 DIAGNOSIS — I495 Sick sinus syndrome: Secondary | ICD-10-CM

## 2017-02-20 NOTE — Telephone Encounter (Signed)
Spoke with pt and reminded pt of remote transmission that is due today. Pt verbalized understanding.   

## 2017-02-20 NOTE — Progress Notes (Signed)
Remote pacemaker transmission.   

## 2017-02-21 ENCOUNTER — Encounter: Payer: Self-pay | Admitting: Cardiology

## 2017-02-22 LAB — CUP PACEART REMOTE DEVICE CHECK
Battery Impedance: 136 Ohm
Battery Remaining Longevity: 116 mo
Battery Voltage: 2.79 V
Brady Statistic AP VP Percent: 94 %
Brady Statistic AP VS Percent: 0 %
Brady Statistic AS VP Percent: 6 %
Brady Statistic AS VS Percent: 0 %
Date Time Interrogation Session: 20180322153336
Implantable Lead Implant Date: 20161205
Implantable Lead Implant Date: 20170321
Implantable Lead Location: 753859
Implantable Lead Location: 753860
Implantable Lead Model: 5076
Implantable Lead Model: 5076
Implantable Pulse Generator Implant Date: 20161205
Lead Channel Impedance Value: 452 Ohm
Lead Channel Impedance Value: 743 Ohm
Lead Channel Pacing Threshold Amplitude: 0.5 V
Lead Channel Pacing Threshold Amplitude: 0.625 V
Lead Channel Pacing Threshold Pulse Width: 0.4 ms
Lead Channel Pacing Threshold Pulse Width: 0.4 ms
Lead Channel Setting Pacing Amplitude: 2 V
Lead Channel Setting Pacing Amplitude: 2.5 V
Lead Channel Setting Pacing Pulse Width: 0.4 ms
Lead Channel Setting Sensing Sensitivity: 4 mV

## 2017-02-25 ENCOUNTER — Ambulatory Visit: Payer: Medicare Other | Admitting: Cardiovascular Disease

## 2017-03-10 ENCOUNTER — Telehealth: Payer: Self-pay | Admitting: Internal Medicine

## 2017-03-10 DIAGNOSIS — D649 Anemia, unspecified: Secondary | ICD-10-CM

## 2017-03-10 DIAGNOSIS — K589 Irritable bowel syndrome without diarrhea: Secondary | ICD-10-CM

## 2017-03-10 NOTE — Telephone Encounter (Signed)
Pt called and needed to make an appt for repeat labs. No orders in. Please advise, thank you!  CAll pt @ [phone]

## 2017-03-10 NOTE — Telephone Encounter (Signed)
Ordered CBC, Erythropoietin, ferritin, and RBC folate based off of last labs. Is there anything else that needs to be ordered? Please advise.

## 2017-03-11 NOTE — Telephone Encounter (Signed)
Per my e MAIL TO HER ON MARCH 20TH,  An IBC, ferritin and cbc are all that are needed .  I have added the missing ones but I do not know how to cancel the  Ones taht are not needed that you have ordered.  Can you   Please handle

## 2017-03-12 ENCOUNTER — Other Ambulatory Visit: Payer: Self-pay

## 2017-03-12 MED ORDER — BUTALBITAL-APAP-CAFFEINE 50-325-40 MG PO TABS: ORAL_TABLET | ORAL | 2 refills | Status: DC

## 2017-03-12 NOTE — Telephone Encounter (Signed)
SEE IN MARCH 2018, NOT 2017 SO .  OK TO REFILL

## 2017-03-12 NOTE — Telephone Encounter (Signed)
Printed, signed and faxed.  

## 2017-03-12 NOTE — Telephone Encounter (Signed)
Refilled: 02/09/2016 Last OV: 02/18/2016 Next OV: 08/25/2017

## 2017-03-24 ENCOUNTER — Other Ambulatory Visit (INDEPENDENT_AMBULATORY_CARE_PROVIDER_SITE_OTHER): Payer: Medicare Other

## 2017-03-24 DIAGNOSIS — D649 Anemia, unspecified: Secondary | ICD-10-CM

## 2017-03-24 DIAGNOSIS — K589 Irritable bowel syndrome without diarrhea: Secondary | ICD-10-CM

## 2017-03-24 LAB — IBC PANEL
Iron: 30 ug/dL — ABNORMAL LOW (ref 42–145)
Saturation Ratios: 7.6 % — ABNORMAL LOW (ref 20.0–50.0)
Transferrin: 283 mg/dL (ref 212.0–360.0)

## 2017-03-24 LAB — CBC WITH DIFFERENTIAL/PLATELET
Basophils Absolute: 0 10*3/uL (ref 0.0–0.1)
Basophils Relative: 1.1 % (ref 0.0–3.0)
Eosinophils Absolute: 0.1 10*3/uL (ref 0.0–0.7)
Eosinophils Relative: 1.9 % (ref 0.0–5.0)
HCT: 31.7 % — ABNORMAL LOW (ref 36.0–46.0)
Hemoglobin: 10.4 g/dL — ABNORMAL LOW (ref 12.0–15.0)
Lymphocytes Relative: 29.3 % (ref 12.0–46.0)
Lymphs Abs: 1.3 10*3/uL (ref 0.7–4.0)
MCHC: 32.9 g/dL (ref 30.0–36.0)
MCV: 85.3 fl (ref 78.0–100.0)
Monocytes Absolute: 0.3 10*3/uL (ref 0.1–1.0)
Monocytes Relative: 6.5 % (ref 3.0–12.0)
Neutro Abs: 2.7 10*3/uL (ref 1.4–7.7)
Neutrophils Relative %: 61.2 % (ref 43.0–77.0)
Platelets: 120 10*3/uL — ABNORMAL LOW (ref 150.0–400.0)
RBC: 3.72 Mil/uL — ABNORMAL LOW (ref 3.87–5.11)
RDW: 20 % — ABNORMAL HIGH (ref 11.5–15.5)
WBC: 4.5 10*3/uL (ref 4.0–10.5)

## 2017-03-24 LAB — FERRITIN: Ferritin: 12.8 ng/mL (ref 10.0–291.0)

## 2017-03-25 ENCOUNTER — Other Ambulatory Visit: Payer: Self-pay | Admitting: Internal Medicine

## 2017-03-25 ENCOUNTER — Ambulatory Visit (INDEPENDENT_AMBULATORY_CARE_PROVIDER_SITE_OTHER): Payer: Medicare Other | Admitting: *Deleted

## 2017-03-25 ENCOUNTER — Encounter: Payer: Self-pay | Admitting: Internal Medicine

## 2017-03-25 DIAGNOSIS — M810 Age-related osteoporosis without current pathological fracture: Secondary | ICD-10-CM | POA: Diagnosis not present

## 2017-03-25 DIAGNOSIS — D509 Iron deficiency anemia, unspecified: Secondary | ICD-10-CM

## 2017-03-25 MED ORDER — DENOSUMAB 60 MG/ML ~~LOC~~ SOLN
60.0000 mg | Freq: Once | SUBCUTANEOUS | Status: AC
  Administered 2017-03-25: 60 mg via SUBCUTANEOUS

## 2017-03-25 NOTE — Progress Notes (Signed)
cbc

## 2017-03-25 NOTE — Progress Notes (Addendum)
Patient presented for 6 month Prolia injection to left arm SUB Q, patient voiced no concerns or showed any signs of distress during injection.  Reviewed.  Dr Nicki Reaper

## 2017-03-26 ENCOUNTER — Other Ambulatory Visit: Payer: Self-pay | Admitting: Internal Medicine

## 2017-04-14 ENCOUNTER — Other Ambulatory Visit: Payer: Self-pay | Admitting: Cardiovascular Disease

## 2017-04-24 ENCOUNTER — Other Ambulatory Visit: Payer: Self-pay | Admitting: Internal Medicine

## 2017-05-06 ENCOUNTER — Ambulatory Visit (INDEPENDENT_AMBULATORY_CARE_PROVIDER_SITE_OTHER): Payer: Medicare Other | Admitting: Cardiovascular Disease

## 2017-05-06 ENCOUNTER — Encounter: Payer: Self-pay | Admitting: Emergency Medicine

## 2017-05-06 ENCOUNTER — Encounter: Payer: Self-pay | Admitting: Cardiovascular Disease

## 2017-05-06 ENCOUNTER — Emergency Department: Payer: Medicare Other

## 2017-05-06 ENCOUNTER — Emergency Department
Admission: EM | Admit: 2017-05-06 | Discharge: 2017-05-06 | Disposition: A | Discharge status: Home / Self Care | Payer: Medicare Other | Attending: Emergency Medicine | Admitting: Emergency Medicine

## 2017-05-06 VITALS — BP 132/72 | HR 74 | Ht 65.0 in | Wt 135.5 lb

## 2017-05-06 DIAGNOSIS — Z952 Presence of prosthetic heart valve: Secondary | ICD-10-CM

## 2017-05-06 DIAGNOSIS — I5022 Chronic systolic (congestive) heart failure: Secondary | ICD-10-CM

## 2017-05-06 DIAGNOSIS — Z7901 Long term (current) use of anticoagulants: Secondary | ICD-10-CM | POA: Diagnosis not present

## 2017-05-06 DIAGNOSIS — I270 Primary pulmonary hypertension: Secondary | ICD-10-CM | POA: Diagnosis not present

## 2017-05-06 DIAGNOSIS — Z950 Presence of cardiac pacemaker: Secondary | ICD-10-CM | POA: Diagnosis not present

## 2017-05-06 DIAGNOSIS — I4891 Unspecified atrial fibrillation: Secondary | ICD-10-CM | POA: Diagnosis not present

## 2017-05-06 DIAGNOSIS — I5033 Acute on chronic diastolic (congestive) heart failure: Secondary | ICD-10-CM | POA: Diagnosis not present

## 2017-05-06 DIAGNOSIS — Z79899 Other long term (current) drug therapy: Secondary | ICD-10-CM | POA: Diagnosis not present

## 2017-05-06 DIAGNOSIS — E782 Mixed hyperlipidemia: Secondary | ICD-10-CM | POA: Diagnosis not present

## 2017-05-06 DIAGNOSIS — G458 Other transient cerebral ischemic attacks and related syndromes: Secondary | ICD-10-CM

## 2017-05-06 DIAGNOSIS — Z8673 Personal history of transient ischemic attack (TIA), and cerebral infarction without residual deficits: Secondary | ICD-10-CM | POA: Insufficient documentation

## 2017-05-06 DIAGNOSIS — R202 Paresthesia of skin: Secondary | ICD-10-CM | POA: Diagnosis present

## 2017-05-06 LAB — DIFFERENTIAL
Basophils Absolute: 0 10*3/uL (ref 0–0.1)
Basophils Relative: 1 %
Eosinophils Absolute: 0.1 10*3/uL (ref 0–0.7)
Eosinophils Relative: 2 %
Lymphocytes Relative: 26 %
Lymphs Abs: 1.4 10*3/uL (ref 1.0–3.6)
Monocytes Absolute: 0.4 10*3/uL (ref 0.2–0.9)
Monocytes Relative: 7 %
Neutro Abs: 3.4 10*3/uL (ref 1.4–6.5)
Neutrophils Relative %: 64 %

## 2017-05-06 LAB — COMPREHENSIVE METABOLIC PANEL
ALT: 31 U/L (ref 14–54)
AST: 41 U/L (ref 15–41)
Albumin: 4.5 g/dL (ref 3.5–5.0)
Alkaline Phosphatase: 62 U/L (ref 38–126)
Anion gap: 7 (ref 5–15)
BUN: 31 mg/dL — ABNORMAL HIGH (ref 6–20)
CO2: 27 mmol/L (ref 22–32)
Calcium: 9.2 mg/dL (ref 8.9–10.3)
Chloride: 104 mmol/L (ref 101–111)
Creatinine, Ser: 0.82 mg/dL (ref 0.44–1.00)
GFR calc Af Amer: >60 mL/min (ref >60)
GFR calc non Af Amer: >60 mL/min (ref >60)
Glucose, Bld: 98 mg/dL (ref 65–99)
Potassium: 4.1 mmol/L (ref 3.5–5.1)
Sodium: 138 mmol/L (ref 135–145)
Total Bilirubin: 0.5 mg/dL (ref 0.3–1.2)
Total Protein: 6.9 g/dL (ref 6.5–8.1)

## 2017-05-06 LAB — PROTIME-INR
INR: 1.11
Prothrombin Time: 14.4 seconds (ref 11.4–15.2)

## 2017-05-06 LAB — CBC
HCT: 32.6 % — ABNORMAL LOW (ref 35.0–47.0)
Hemoglobin: 10.8 g/dL — ABNORMAL LOW (ref 12.0–16.0)
MCH: 28.1 pg (ref 26.0–34.0)
MCHC: 33.1 g/dL (ref 32.0–36.0)
MCV: 85 fL (ref 80.0–100.0)
Platelets: 117 10*3/uL — ABNORMAL LOW (ref 150–440)
RBC: 3.83 MIL/uL (ref 3.80–5.20)
RDW: 20.6 % — ABNORMAL HIGH (ref 11.5–14.5)
WBC: 5.4 10*3/uL (ref 3.6–11.0)

## 2017-05-06 LAB — APTT: aPTT: 33 seconds (ref 24–36)

## 2017-05-06 LAB — TROPONIN I: Troponin I: <0.03 ng/mL (ref <0.03)

## 2017-05-06 NOTE — Patient Instructions (Signed)

## 2017-05-06 NOTE — Discharge Instructions (Signed)
Please call your neurologist tomorrow morning to arrange a follow-up appointment this week. Return to the emergency department for any new weakness, numbness, confusion or slurred speech.

## 2017-05-06 NOTE — Progress Notes (Signed)
[name] [name] Date of Birth  [birth year]       [facility] Office [address], [place], [place]  [address]   [place], [place]  [address] [phone]     [phone]   Fax  [phone]    Fax [phone]  Problem List: 1. Supraventricular tachycardia 2. Mitral valve prolapse - s/p mitral valve replacement  3. Cataracts 4.     [name] [name] is a  76 yo with hx of SVT and MVP.   She has done well.  She has not been exercising as much as she would like.  She has not had any syncope or presyncope.  She has been taking care of her granddaughter who has ADD and a sleep disorder.  [name] [name] did not get much sleep that weekend and had lots of palpitations for the following week.  Oct. 7, 2014  [name] [name] is seen after a 1 1/2 year absence.  She has had some fatigue.   Dr. Derrel Nip has found thrombocytopenia.  Crestor was stopped but this did not help the thrombocytopenia.   She has seen a hemotologist.    She fell  (caught her toe at a [employer] - not syncope) several months ago and had reconstruction of her jaw.  She also broke her left wrist.     Oct. 2, 2015 :  [name] [name] is doing well from a cardiac standpoint.  Her cholesterol levels are slightly elevated ( even on Crestor 5 mg a day).  She has had marginally low platelets .  Her cholesterol levels are still minimally elevated.    She had some dyspnea this summer.  Currently , she has no limitations.  She thinks the symptoms may have been due to allergies.  No chest pain.      She thinks that her stamina is not as good as it should be.  She does yoga but no cardio workouts.    Sept 6, 2016:  [name] [name] is seen today for a recent episode of paroxysmal atrial fib August 21 was shopping , felt very weak .   Could not walk to her car.  Very short of breath .  She went to [place] a day or so later.  Gradually had worsening chest tightness, allergy, weakness.  Thought it was allergies.    Had significant leg swelling .   She went to the hospital. She was found have rapid atrial fibrillation with evidence of congestive heart failure. Her echocardiogram showed normal left ventricle systolic function. She has mild mitral regurgitation. Was significant calcification of the mitral valve annulus. She was started on Lovenox. She's not received Coumadin at this point.  She was started on diltiazem and Lasix.  Was started on Lovenox ( because of the atrial fib in the setting of MR and itral valve calcification .   She seems to be doing quite a lot at her. She's been going to doctors appointments and has been walking across the parking lot  without any CP with problems.  Sept. 13, 2016:  [name] [name] is seen back for a 1 week visit. She was found to have rapid atrial fib last week Had some dyspnea last night - better with a Xopenex sample inhaler.  Feels better on the Eliquis and Metoprolol .  Feels more relaxed.  Cannot tell if she is in atrial fib Has low energy .  Difficult to walk to the mailbox without extreme fatigue ,  not specifically short of breath   Oct. 11, 2016:  Was cardioverted several weeks ago.  Feeling better every day since that   Nov. 10, 2016:  Has done well.  Steady improvement since  the cardioversion.  Then for the past several days/ weeks has not been doing quite a good Has shortness of breath with showering and doing everyday activities.    Feb. 10, 2017:  Is doing well - has had Mitral valve replacement and MAZE procedure since I last saw her  Just this past week, she felt much better.   Has been fatigued but now has more energetic . Has gain  Apr 16, 2016:  [name] [name] has had an atrial pacer lead revision since I last saw her. Feels "a bit off since last Thursday " Was bending over to prune some bushes.  Has not felt right since that time  Goes to the gym [name] seismologist)  Feels "odd" when she bends over.  Has been anxious recently   Sept. 18,  2017:  [name] [name] is seen today for follow up visit Doing well. No cardiac complaints.   Feb. 27, 2018:  [name] [name] presents today for evaluation of leg weakness. She has trouble walking from her car to the cardiac rehab but then is able to exercise on the treadmill for 30 minutes without any problems .   Not in atrial fib  Does not take the lasix on a regular basis   May 06, 2017:  [name] [name] is seen back for follow up visit  She is doing much better.   She was having lots of DOE Dr. Derrel Nip found that she was anemic and prescribed iron.  Since starting iron, she has been feeling much better.  Is planning on moving  into [employer] .    This past Saturday , had what may have been a TIA in her left hand.   Had numbness - lasted 4 minutes.  Felt "dead" but she never lost use of her hand. She was able to shake her hand and work the "deadness" out  Has not missed any doses of Eliquis.   Current Outpatient Prescriptions on File Prior to Visit  Medication Sig Dispense Refill  . albuterol (PROVENTIL HFA;VENTOLIN HFA) 108 (90 BASE) MCG/ACT inhaler Inhale 2 puffs into the lungs every 6 (six) hours as needed for wheezing or shortness of breath. 1 Inhaler 2  . ALPRAZolam (XANAX) 0.25 MG tablet Take 1 tablet (0.25 mg total) by mouth at bedtime as needed for anxiety or sleep. 30 tablet 5  . amoxicillin (AMOXIL) 500 MG capsule TAKE 4 CAPSULES BY MOUTH 60 MINUTES PRIOR TO DENTAL WORK 30 capsule 0  . apixaban (ELIQUIS) 5 MG TABS tablet Take 1 tablet (5 mg total) by mouth 2 (two) times daily. 180 tablet 3  . azelastine (ASTELIN) 0.1 % nasal spray Place 1 spray into both nostrils as needed for allergies.   5  . b complex vitamins tablet Take 1 tablet by mouth daily.    . butalbital-acetaminophen-caffeine (FIORICET, ESGIC) 50-325-40 MG tablet TAKE 1 TABLET BY MOUTH AS NEEDED FOR BACK PAIN OR HEADACHE 90 tablet 2  . Cholecalciferol (VITAMIN D) 2000 units CAPS Take 2,000 Units by mouth daily.     . Coenzyme Q10 (CO Q 10) 100 MG CAPS Take 200 mg by mouth daily.     Marland Kitchen FeFum-FePo-FA-B Cmp-C-Zn-Mn-Cu (SE-TAN PLUS) 162-115.2-1 MG CAPS TAKE 1 CAPSULE EVERY DAY WITH BREAKFAST 30 capsule 3  . furosemide (LASIX) 40  MG tablet Take 40 mg by mouth as directed.    . metoprolol tartrate (LOPRESSOR) 25 MG tablet TAKE 1/2 TABLET BY MOUTH TWICE DAILY 90 tablet 0  . montelukast (SINGULAIR) 10 MG tablet Take 10 mg by mouth at bedtime.     . rosuvastatin (CRESTOR) 40 MG tablet TAKE 1 TABLET BY MOUTH DAILY 30 tablet 3   No current facility-administered medications on file prior to visit.     Allergies  Allergen Reactions  . Meloxicam Other (See Comments)    Severe reflux  . Codeine Nausea And Vomiting    migraine  . Doxycycline Itching and Swelling    Facial  . Hydrocodone Nausea And Vomiting    MIGRAINE  . Hydromorphone Nausea And Vomiting    migraine  . Oxycodone Nausea And Vomiting    Severe migraine    Past Medical History:  Diagnosis Date  . Acute on chronic diastolic (congestive) heart failure ([place])   . Anxiety associated with depression    Prn alprazolam   . Atrial fibrillation, persistent ([place])    DCCV 08/22/2015  . Bradycardia post-op bradycardia, pacer dependent   MDT PPM 11/06/15, Dr. Lovena Le  . Diverticulosis   . Hemorrhoid   . Hepatic cyst    innumerable  . 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 Hyperlipidemia   . IBS (irritable bowel syndrome)   . Idiopathic thrombocytopenic purpura (ITP)   . Migraine   . MVP (mitral valve prolapse)   . Nodule of right lung   . RBBB   . Restrictive lung disease    Mild on PFT & likely cardiac in etiology   . S/P Minimally invasive maze operation for atrial fibrillation 10/31/2015   Complete bilateral atrial lesion set using cryothermy and bipolar radiofrequency ablation with clipping of LA appendage via right mini thoracotomy approach  . 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 Severe mitral regurgitation   . SVT (supraventricular tachycardia) ([place])   . Thoracic aorta atherosclerosis ([place])   . TIA (transient ischemic attack)     Past Surgical History:  Procedure Laterality Date  . BREAST BIOPSY Right Late 80s   Negative  . CARDIAC CATHETERIZATION N/A 10/18/2015   Procedure: Right/Left Heart Cath and Coronary Angiography;  Surgeon: Sherren Mocha, MD;  Location: [place] [facility];  Service: Cardiovascular;  Laterality: N/A;  . CARDIOVERSION N/A 08/22/2015   Procedure: CARDIOVERSION;  Surgeon: Thayer Headings, MD;  Location: [place];  Service: Cardiovascular;  Laterality: N/A;  . COLONOSCOPY  2003  . EP IMPLANTABLE DEVICE N/A 11/06/2015   Procedure: Pacemaker Implant;  Surgeon: Evans Lance, MD;  Location: [place] [facility];  Service: Cardiovascular;  Laterality: N/A;  . EP IMPLANTABLE DEVICE N/A 02/20/2016   Procedure: Lead Extraction;  Surgeon: Evans Lance, MD;  Location: [place] [facility];  Service: Cardiovascular;  Laterality: N/A;  . EYE SURGERY Right 2013  . FOOT SURGERY    . MANDIBLE FRACTURE SURGERY  03-26-13  . MINIMALLY INVASIVE MAZE PROCEDURE N/A 10/31/2015   Procedure: MINIMALLY INVASIVE MAZE PROCEDURE;  Surgeon: Rexene Alberts, MD;  Location: [place];  Service: Open Heart Surgery;  Laterality: N/A;  . MITRAL VALVE REPLACEMENT Right 10/31/2015   Procedure: MINIMALLY INVASIVE MITRAL VALVE (MV) REPLACEMENT;  Surgeon: Rexene Alberts, MD;  Location: [place];  Service: Open Heart Surgery;  Laterality: Right;  .  PACEMAKER LEAD REMOVAL  02/20/2016  . TEE WITH CARDIOVERSION    . TEE WITHOUT CARDIOVERSION N/A 08/22/2015   Procedure: TRANSESOPHAGEAL ECHOCARDIOGRAM (TEE);  Surgeon: Thayer Headings, MD;  Location: [facility];  Service: Cardiovascular;  Laterality: N/A;  . TEE WITHOUT CARDIOVERSION N/A 10/31/2015   Procedure: TRANSESOPHAGEAL ECHOCARDIOGRAM  (TEE);  Surgeon: Rexene Alberts, MD;  Location: [facility];  Service: Open Heart Surgery;  Laterality: N/A;  . TUBAL LIGATION    . VARICOSE VEIN SURGERY Right     History  Smoking Status  . Never Smoker  Smokeless Tobacco  . Never Used    History  Alcohol Use No    Family History  Problem Relation Age of Onset  . Hypertension Mother   . Arrhythmia Mother   . Heart failure Mother   . Arrhythmia Brother   . Stroke Brother 46       cerebral hemorrhage, nonsmoker, no HTN  . Prostate cancer Brother   . Stroke Father        from an aneurysm  . Stroke Maternal Aunt 83       cerebral hemorrhage  . Liver cancer Maternal Grandmother   . Atrial fibrillation Son   . Celiac disease Son   . Heart attack Neg Hx   . Breast cancer Neg Hx     Reviw of Systems:  Reviewed in the HPI.  All other systems are negative.  Physical Exam: Blood pressure 132/72, pulse 74, height 5\' 5"  (1.651 m), weight 135 lb 8 oz (61.5 kg), SpO2 96 %. General: Well developed, well nourished, in no acute distress. Head: Normocephalic, atraumatic, sclera non-icteric, mucus membranes are moist,  Neck: Supple. Carotids are 2 + without bruits. No JVD Lungs: Clear bilaterally to auscultation. Heart:  RR with occasional premature beats , chest is non tender ,  Soft systolic  Abdomen: Soft, non-tender, non-distended with normal bowel sounds. No hepatomegaly. No rebound/guarding. No masses. Msk:  Strength and tone are normal Extremities: No clubbing or cyanosis. No edema.  Distal pedal pulses are 2+ and equal bilaterally. Neuro: Alert and oriented X 3. Moves all extremities spontaneously. Psych:  Normal   ECG:     Assessment / Plan:   1.  ? TIA :   Had transient left hand numbness  Lasted for 4 minutes. Was able to shake it out Has not recurred Has not missed any Eliquis.   She sees ( neurology) Dr. Manuella Ghazi in [place].    I've encourage her to call him today.   She will likely need further evaluation   2.  Dyspnea with exertion:    3. Persistent Atrial fibrillation with rapid ventricular response: she is maintaining NSR.  She status post MAZE procedure . She is still on Eliquis . She has had a TIA and wants to continue on Eliquis.  Will continue Eliquis for now .   4.  MVP with MR:  Now status post mitral valve replacement. She is doing quite well. She is getting over anesthesia and her energy levels have improved.  5. Congestive heart failure: her CHF symptoms have greatly improved. Will repeat the echo to make sure her EF is stable   6. Pacemaker: She status post pacemaker implantation. Has not had any recent mode switches.  7. Hyperlipidemia:   Lipids look great.      Mertie Moores, MD  05/06/2017 9:23 AM    [address],  [place], [place]  [phone] Pager  [place] Phone: [phone]; Fax: [phone]

## 2017-05-06 NOTE — ED Provider Notes (Signed)
[facility] Emergency Department Provider Note  Time seen: 7:46 PM  I have reviewed the triage vital signs and the nursing notes.   HISTORY  Chief Complaint Numbness (left hand numbness for 4 minutes on Saturday)    HPI [name] [name] is a 76 y.o. female presents to the emergency department with left hand numbness. According to the patient 3 days ago she developed 4 minutes of left hand numbness. Patient states she felt that going numb, remembered looking at her clock and states it took 4 minutes before the numbness went away. Patient has a history of a TIA 1 year ago involving her left foot and left hand. States she was admitted to the hospital at that time and had a workup performed. Patient has a pacemaker that does not allow an MRI per patient. She was told it is very important that she informs [place] that she cannot have an MRI. Patient is on Eliquis. Patient states that her symptoms completely resolve she did not feel it was necessary to seek immediate attention. She called her doctor today who referred her to her neurologist. She tried to see her neurologist but they recommended she come to the ER for evaluation. Here the patient denies any symptoms at this time. Denies any headache confusion or slurred speech at any time. Denies any lower extremity involvement. States her left hand went numb but she was still able to move it this lasted for minutes 3 days ago and has not had any issues since.  Past Medical History:  Diagnosis Date  . Acute on chronic diastolic (congestive) heart failure ([place])   . Anxiety associated with depression    Prn alprazolam   . Atrial fibrillation, persistent ([place])    DCCV 08/22/2015  . Bradycardia post-op bradycardia, pacer dependent   MDT PPM 11/06/15, Dr. Lovena Le  . Diverticulosis   . Hemorrhoid   . Hepatic cyst    innumerable  . 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 Hyperlipidemia   . IBS (irritable bowel syndrome)   . Idiopathic  thrombocytopenic purpura (ITP)   . Migraine   . MVP (mitral valve prolapse)   . Nodule of right lung   . RBBB   . Restrictive lung disease    Mild on PFT & likely cardiac in etiology   . S/P Minimally invasive maze operation for atrial fibrillation 10/31/2015   Complete bilateral atrial lesion set using cryothermy and bipolar radiofrequency ablation with clipping of LA appendage via right mini thoracotomy approach  . S/P minimally invasive mitral valve replacement with bioprosthetic valve 10/31/2015   33 mm [facility] Mitral bovine bioprosthetic tissue valve placed via right mini thoracotomy approach  . Severe mitral regurgitation   . SVT (supraventricular tachycardia) ([place])   . Thoracic aorta atherosclerosis ([place])   . TIA (transient ischemic attack)     Patient Active Problem List   Diagnosis Date Noted  . Fatigue 02/17/2017  . Anemia 02/17/2017  . DOE (dyspnea on exertion) 01/28/2017  . Visit for preventive health examination 05/23/2016  . Cellulitis of groin 05/22/2016  . Hospital discharge follow-up 05/11/2016  . Hepatic cyst 05/04/2016  . TIA (transient ischemic attack) 05/03/2016  . Chronic systolic congestive heart failure ([place]) 05/03/2016  . Pacemaker lead failure 02/20/2016  . Peliosis hepatis 02/15/2016  . Prediabetes 02/15/2016  . Pacemaker 11/06/2015  . H/O mitral valve replacement 10/31/2015  . S/P Minimally invasive maze operation for atrial fibrillation 10/31/2015  . Atrial fibrillation, persistent ([place])   .  Restrictive lung disease 09/12/2015  . Pulmonary hypertension ([place]) 08/08/2015  . Nodule of right lung 08/04/2015  . Lumbago 02/05/2015  . Thoracic aorta atherosclerosis ([place]) 02/05/2015  . Family history of cerebral aneurysm 08/02/2014  . Diverticulosis of colon without hemorrhage 01/09/2014  . Thrombocytopenia ([place]) 02/04/2013  . IBS (irritable bowel syndrome) 12/31/2012  . History of asbestos exposure 06/27/2012  . Osteoporosis of forearm  06/27/2012  . Anxiety associated with depression 06/25/2012  . Cataract extraction status of right eye 06/25/2012  . RBBB 05/05/2012  . Hyperlipidemia 08/06/2011  . Mitral valve prolapse 05/02/2011    Past Surgical History:  Procedure Laterality Date  . BREAST BIOPSY Right Late 80s   Negative  . CARDIAC CATHETERIZATION N/A 10/18/2015   Procedure: Right/Left Heart Cath and Coronary Angiography;  Surgeon: Sherren Mocha, MD;  Location: [place] [facility];  Service: Cardiovascular;  Laterality: N/A;  . CARDIOVERSION N/A 08/22/2015   Procedure: CARDIOVERSION;  Surgeon: Thayer Headings, MD;  Location: [place];  Service: Cardiovascular;  Laterality: N/A;  . COLONOSCOPY  2003  . EP IMPLANTABLE DEVICE N/A 11/06/2015   Procedure: Pacemaker Implant;  Surgeon: Evans Lance, MD;  Location: [place] [facility];  Service: Cardiovascular;  Laterality: N/A;  . EP IMPLANTABLE DEVICE N/A 02/20/2016   Procedure: Lead Extraction;  Surgeon: Evans Lance, MD;  Location: [place] [facility];  Service: Cardiovascular;  Laterality: N/A;  . EYE SURGERY Right 2013  . FOOT SURGERY    . MANDIBLE FRACTURE SURGERY  03-26-13  . MINIMALLY INVASIVE MAZE PROCEDURE N/A 10/31/2015   Procedure: MINIMALLY INVASIVE MAZE PROCEDURE;  Surgeon: Rexene Alberts, MD;  Location: [place];  Service: Open Heart Surgery;  Laterality: N/A;  . MITRAL VALVE REPLACEMENT Right 10/31/2015   Procedure: MINIMALLY INVASIVE MITRAL VALVE (MV) REPLACEMENT;  Surgeon: Rexene Alberts, MD;  Location: [place];  Service: Open Heart Surgery;  Laterality: Right;  . PACEMAKER LEAD REMOVAL  02/20/2016  . TEE WITH CARDIOVERSION    . TEE WITHOUT CARDIOVERSION N/A 08/22/2015   Procedure: TRANSESOPHAGEAL ECHOCARDIOGRAM (TEE);  Surgeon: Thayer Headings, MD;  Location: [place];  Service: Cardiovascular;  Laterality: N/A;  . TEE WITHOUT CARDIOVERSION N/A 10/31/2015   Procedure: TRANSESOPHAGEAL ECHOCARDIOGRAM (TEE);  Surgeon: Rexene Alberts, MD;  Location:  [place];  Service: Open Heart Surgery;  Laterality: N/A;  . TUBAL LIGATION    . VARICOSE VEIN SURGERY Right     Prior to Admission medications   Medication Sig Start Date End Date Taking? Authorizing Provider  albuterol (PROVENTIL HFA;VENTOLIN HFA) 108 (90 BASE) MCG/ACT inhaler Inhale 2 puffs into the lungs every 6 (six) hours as needed for wheezing or shortness of breath. 08/15/15   Nahser, Wonda Cheng, MD  ALPRAZolam Duanne Moron) 0.25 MG tablet Take 1 tablet (0.25 mg total) by mouth at bedtime as needed for anxiety or sleep. 08/18/15   [name], MD  amoxicillin (AMOXIL) 500 MG capsule TAKE 4 CAPSULES BY MOUTH 60 MINUTES PRIOR TO DENTAL WORK 12/04/16   Eileen Stanford, PA-C  apixaban (ELIQUIS) 5 MG TABS tablet Take 1 tablet (5 mg total) by mouth 2 (two) times daily. 04/16/16   Nahser, Wonda Cheng, MD  azelastine (ASTELIN) 0.1 % nasal spray Place 1 spray into both nostrils as needed for allergies.  09/05/15   [provider]  b complex vitamins tablet Take 1 tablet by mouth daily.    [provider]  butalbital-acetaminophen-caffeine (FIORICET, ESGIC) 50-325-40 MG tablet TAKE 1 TABLET BY  MOUTH AS NEEDED FOR BACK PAIN OR HEADACHE 03/12/17   [name], MD  Cholecalciferol (VITAMIN D) 2000 units CAPS Take 2,000 Units by mouth daily.    [provider]  Coenzyme Q10 (CO Q 10) 100 MG CAPS Take 200 mg by mouth daily.     [provider]  FeFum-FePo-FA-B Cmp-C-Zn-Mn-Cu (SE-TAN PLUS) 162-115.2-1 MG CAPS TAKE 1 CAPSULE EVERY DAY WITH BREAKFAST 03/26/17   [name], MD  furosemide (LASIX) 40 MG tablet Take 40 mg by mouth as directed.    [provider]  metoprolol tartrate (LOPRESSOR) 25 MG tablet TAKE 1/2 TABLET BY MOUTH TWICE DAILY 04/14/17   Nahser, Wonda Cheng, MD  montelukast (SINGULAIR) 10 MG tablet Take 10 mg by mouth at bedtime.     [provider]  rosuvastatin (CRESTOR) 40 MG tablet TAKE 1 TABLET BY MOUTH DAILY 04/25/17   [name], MD     Allergies  Allergen Reactions  . Meloxicam Other (See Comments)    Severe reflux  . Codeine Nausea And Vomiting    migraine  . Doxycycline Itching and Swelling    Facial  . Hydrocodone Nausea And Vomiting    MIGRAINE  . Hydromorphone Nausea And Vomiting    migraine  . Oxycodone Nausea And Vomiting    Severe migraine    Family History  Problem Relation Age of Onset  . Hypertension Mother   . Arrhythmia Mother   . Heart failure Mother   . Arrhythmia Brother   . Stroke Brother 59       cerebral hemorrhage, nonsmoker, no HTN  . Prostate cancer Brother   . Stroke Father        from an aneurysm  . Stroke Maternal Aunt 83       cerebral hemorrhage  . Liver cancer Maternal Grandmother   . Atrial fibrillation Son   . Celiac disease Son   . Heart attack Neg Hx   . Breast cancer Neg Hx     Social History Social History  Substance Use Topics  . Smoking status: Never Smoker  . Smokeless tobacco: Never Used  . Alcohol use No    Review of Systems Constitutional: Negative for fever. Eyes: Negative for visual changes. Cardiovascular: Negative for chest pain. Respiratory: Negative for shortness of breath. Gastrointestinal: Negative for abdominal pain Musculoskeletal: Negative for extremity pain Skin: Negative for rash. Neurological: Negative for headache. 4 minutes of left hand numbness 3 days ago. No weakness. All other ROS negative  ____________________________________________   PHYSICAL EXAM:  VITAL SIGNS: ED Triage Vitals  Enc Vitals Group     BP 05/06/17 1709 127/82     Pulse Rate 05/06/17 1709 78     Resp 05/06/17 1709 20     Temp 05/06/17 1709 98.6 F (37 C)     Temp Source 05/06/17 1709 Oral     SpO2 05/06/17 1709 100 %     Weight 05/06/17 1710 135 lb (61.2 kg)     Height 05/06/17 1710 5\' 5"  (1.651 m)     Head Circumference --      Peak Flow --      Pain Score --      Pain Loc --      Pain Edu? --      Excl. in [place]? --     Constitutional:  Alert and oriented. Well appearing and in no distress. Eyes: Normal exam ENT   Head: Normocephalic and atraumatic.   Mouth/Throat: Mucous membranes are  moist. Cardiovascular: Normal rate, regular rhythm. No murmur Respiratory: Normal respiratory effort without tachypnea nor retractions. Breath sounds are clear  Gastrointestinal: Soft and nontender. No distention.   Musculoskeletal: Nontender with normal range of motion in all extremities Neurologic:  Normal speech and language. No gross focal neurologic deficits. Patient states mild neuropathy of the left hand since a fall years ago. Denies any increased numbness. Denies any weakness. Good grip strength. No pronator drift. Cranial nerves intact. Skin:  Skin is warm, dry and intact.  Psychiatric: Mood and affect are normal. Speech and behavior are normal.   ____________________________________________    EKG  EKG reviewed and interpreted by myself shows an atrial paced rhythm at 80 bpm, widened QRS, normal axis, nonspecific ST changes.  ____________________________________________    RADIOLOGY  CT head negative  ____________________________________________   INITIAL IMPRESSION / ASSESSMENT AND PLAN / ED COURSE  Pertinent labs & imaging results that were available during my care of the patient were reviewed by me and considered in my medical decision making (see chart for details).  Patient presents for left hand numbness 3 days ago which lasted 4 minutes and has resolved. Patient was admitted to the hospital one year ago for a TIA workup. Patient is on anticoagulation. She appears to have already been maximized on medical therapy. I discussed with Dr. Brigitte Pulse her neurologist, who states there is no further workup needed in the patient will follow-up with him this week. The patient is agreeable to this plan and will call tomorrow morning. Patient remains asymptomatic. No medication changes at this time.  NIH Stroke  Scale   Interval: Baseline Time: 7:50 PM Person Administering Scale: Carsyn Taubman  Administer stroke scale items in the order listed. Record performance in each category after each subscale exam. Do not go back and change scores. Follow directions provided for each exam technique. Scores should reflect what the patient does, not what the clinician thinks the patient can do. The clinician should record answers while administering the exam and work quickly. Except where indicated, the patient should not be coached (i.e., repeated requests to patient to make a special effort).   1a  Level of consciousness: 0=alert; keenly responsive  1b. LOC questions:  0=Performs both tasks correctly  1c. LOC commands: 0=Performs both tasks correctly  2.  Best Gaze: 0=normal  3.  Visual: 0=No visual loss  4. Facial Palsy: 0=Normal symmetric movement  5a.  Motor left arm: 0=No drift, limb holds 90 (or 45) degrees for full 10 seconds  5b.  Motor right arm: 0=No drift, limb holds 90 (or 45) degrees for full 10 seconds  6a. motor left leg: 0=No drift, limb holds 90 (or 45) degrees for full 10 seconds  6b  Motor right leg:  0=No drift, limb holds 90 (or 45) degrees for full 10 seconds  7. Limb Ataxia: 0=Absent  8.  Sensory: 0=Normal; no sensory loss  9. Best Language:  0=No aphasia, normal  10. Dysarthria: 0=Normal  11. Extinction and Inattention: 0=No abnormality  12. Distal motor function: 0=Normal   Total:   0    ____________________________________________   FINAL CLINICAL IMPRESSION(S) / ED DIAGNOSES  Transient ischemic attack    Harvest Dark, MD 05/06/17 1950

## 2017-05-06 NOTE — ED Triage Notes (Signed)
Sent to ED for ED Eval by Dr. Brigitte Pulse today.  Patient states on Saturday at around 1330, had an episode of left hand numbness.  Patient has not had any symptoms since Saturday.  Dr. Brigitte Pulse wanted patient sent to ED for a "Full workup, before he would see patient".

## 2017-05-06 NOTE — ED Notes (Signed)

## 2017-05-07 ENCOUNTER — Other Ambulatory Visit: Payer: Self-pay | Admitting: Cardiovascular Disease

## 2017-05-07 NOTE — Telephone Encounter (Signed)
Eliquis refill received; pt is 76 yrs old, 61.5kg, Crea-0.82 on 05/06/17, last seen by Dr. Acie Fredrickson on 05/06/17. Will send in refill as requested to requested Pharmacy.

## 2017-05-22 ENCOUNTER — Ambulatory Visit: Payer: Medicare Other

## 2017-05-22 ENCOUNTER — Ambulatory Visit: Payer: Medicare Other | Attending: Neurology | Admitting: Occupational Therapy

## 2017-05-22 DIAGNOSIS — M25642 Stiffness of left hand, not elsewhere classified: Secondary | ICD-10-CM | POA: Diagnosis not present

## 2017-05-22 DIAGNOSIS — M79641 Pain in right hand: Secondary | ICD-10-CM | POA: Insufficient documentation

## 2017-05-22 NOTE — Therapy (Signed)
[facility] [address] [place], [address] Phone: [phone]   Fax:  [phone]  Occupational Therapy Evaluation  Patient Details  Name: [name] [name] MRN: [id number] Date of Birth: 1941-09-28 Referring Provider: Dr Manuella Ghazi  Encounter Date: 05/22/2017      OT End of Session - 05/22/17 1112    Visit Number 1   Number of Visits 1   Date for OT Re-Evaluation 05/22/17   OT Start Time 0850   OT Stop Time 0951   OT Time Calculation (min) 61 min   Activity Tolerance Patient tolerated treatment well   Behavior During Therapy [facility] for tasks assessed/performed      Past Medical History:  Diagnosis Date  . Acute on chronic diastolic (congestive) heart failure ([place])   . Anxiety associated with depression    Prn alprazolam   . Atrial fibrillation, persistent ([place])    DCCV 08/22/2015  . Bradycardia post-op bradycardia, pacer dependent   MDT PPM 11/06/15, Dr. Lovena Le  . Diverticulosis   . Hemorrhoid   . Hepatic cyst    innumerable  . 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 Hyperlipidemia   . IBS (irritable bowel syndrome)   . Idiopathic thrombocytopenic purpura (ITP)   . Migraine   . MVP (mitral valve prolapse)   . Nodule of right lung   . RBBB   . Restrictive lung disease    Mild on PFT & likely cardiac in etiology   . S/P Minimally invasive maze operation for atrial fibrillation 10/31/2015   Complete bilateral atrial lesion set using cryothermy and bipolar radiofrequency ablation with clipping of LA appendage via right mini thoracotomy approach  . S/P minimally invasive mitral valve replacement with bioprosthetic valve 10/31/2015   33 mm [facility] Mitral bovine bioprosthetic tissue valve placed via right mini thoracotomy approach  . Severe mitral regurgitation   . SVT (supraventricular tachycardia) ([place])   . Thoracic aorta atherosclerosis ([place])   . TIA (transient ischemic attack)     Past Surgical  History:  Procedure Laterality Date  . BREAST BIOPSY Right Late 80s   Negative  . CARDIAC CATHETERIZATION N/A 10/18/2015   Procedure: Right/Left Heart Cath and Coronary Angiography;  Surgeon: Sherren Mocha, MD;  Location: [place] [facility];  Service: Cardiovascular;  Laterality: N/A;  . CARDIOVERSION N/A 08/22/2015   Procedure: CARDIOVERSION;  Surgeon: Thayer Headings, MD;  Location: [place];  Service: Cardiovascular;  Laterality: N/A;  . COLONOSCOPY  2003  . EP IMPLANTABLE DEVICE N/A 11/06/2015   Procedure: Pacemaker Implant;  Surgeon: Evans Lance, MD;  Location: [place] [facility];  Service: Cardiovascular;  Laterality: N/A;  . EP IMPLANTABLE DEVICE N/A 02/20/2016   Procedure: Lead Extraction;  Surgeon: Evans Lance, MD;  Location: [place] [facility];  Service: Cardiovascular;  Laterality: N/A;  . EYE SURGERY Right 2013  . FOOT SURGERY    . MANDIBLE FRACTURE SURGERY  03-26-13  . MINIMALLY INVASIVE MAZE PROCEDURE N/A 10/31/2015   Procedure: MINIMALLY INVASIVE MAZE PROCEDURE;  Surgeon: Rexene Alberts, MD;  Location: [place];  Service: Open Heart Surgery;  Laterality: N/A;  . MITRAL VALVE REPLACEMENT Right 10/31/2015   Procedure: MINIMALLY INVASIVE MITRAL VALVE (MV) REPLACEMENT;  Surgeon: Rexene Alberts, MD;  Location: [place];  Service: Open Heart Surgery;  Laterality: Right;  . PACEMAKER LEAD REMOVAL  02/20/2016  . TEE WITH CARDIOVERSION    . TEE WITHOUT CARDIOVERSION N/A 08/22/2015   Procedure:  TRANSESOPHAGEAL ECHOCARDIOGRAM (TEE);  Surgeon: Thayer Headings, MD;  Location: [place];  Service: Cardiovascular;  Laterality: N/A;  . TEE WITHOUT CARDIOVERSION N/A 10/31/2015   Procedure: TRANSESOPHAGEAL ECHOCARDIOGRAM (TEE);  Surgeon: Rexene Alberts, MD;  Location: [place];  Service: Open Heart Surgery;  Laterality: N/A;  . TUBAL LIGATION    . VARICOSE VEIN SURGERY Right     There were no vitals filed for this visit.      Subjective Assessment - 05/22/17 1104    Subjective  I  had this problem for more than 12 yrs probably - thumb into my palm and then in 2014 I fell and fracture my R wrist and sprain my thumb really bad - I have so many gadgets , and love them to help me - it do not hurt except if I over use my hands or thumbs and  in the winter    Patient Stated Goals I just do not want my thumbs or hands to get worse , or painfull    Currently in Pain? No/denies           [facility] OT Assessment - 05/22/17 0001      Assessment   Diagnosis L hand weakness   Referring Provider Dr Manuella Ghazi   Onset Date 05/07/17     Home  Environment   Lives With Alone     Prior Function   Vocation Retired   Leisure Pt is retired English as a second language teacher,  R hand dominant - moving to [employer] , likes to read or doing everything on her IPONE , sewing, gardening , photography       Strength   Right Hand Grip (lbs) 42   Right Hand Lateral Pinch 8 lbs   Right Hand 3 Point Pinch 7 lbs   Left Hand Grip (lbs) 30   Left Hand Lateral Pinch 6 lbs   Left Hand 3 Point Pinch 9 lbs     Left Hand AROM   L Thumb MCP 0-60 --  no flexion , only hyper extention    L Thumb IP 0-80 90 Degrees   L Thumb Radial ADduction/ABduction 0-55 --  no CMC AROM    L Thumb Palmar ADduction/ABduction 0-45 --  no CMC ROM         Fitted with CMC neoprene splints for bilateral hands to use with activities that cause pain  And for support at [facility]  But with 3 point pinch on the L - pt actually relies to years of compensation - and has more pinch grip without splint   Pt ed on joint protection and AE - to decrease wear and tear on thumbs  Hand out reviewed and  Provided for pt                    OT Education - 05/22/17 1111    Education provided Yes   Education Details findings of eval info , and homeprogram    Person(s) Educated Patient   Methods Explanation;Demonstration;Tactile cues;Verbal cues;Handout   Comprehension Verbalized understanding;Returned demonstration;Verbal cues required              OT Long Term Goals - 05/22/17 1121      OT LONG TERM GOAL #1   Title Pt to verbalize understanding for wearing CMC neoprene splints ,  and at least 3  joint protection and AE to be use at home in ADL's and IADL's    Time 1   Period Days   Status Achieved  Plan - 05/22/17 1112    Clinical Impression Statement Pt refer to OT for L hand weakness - pt present with 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 - pt report she had it for years , and sprain it bad 2014 in fall - pt present with CMC frozen and MP compensating with hyper extention , and IP with hyper flexion - pt show grip and prehension strength in bilateral hands in range for her age - pt do have pain in the winter or if overusing them - pt was fitted with cmc neoprene splints to use that support CMC - but did note that L 3 point pinch is stronger wthout splint because of years of compensating with ADD of CMC during loading - pt was provided info and reviewed joint protection and AE info to prevent it from getting worse or that pain increase  - pt  happy with intervention  - and can contact me in future if needed    Occupational performance deficits (Please refer to evaluation for details): IADL's;ADL's;Play;Leisure   OT Frequency One time visit   OT Treatment/Interventions Splinting;Self-care/ADL training   Plan pt discharge with homeprogram    OT Home Exercise Plan see pt instruction   Consulted and Agree with Plan of Care Patient      Patient will benefit from skilled therapeutic intervention in order to improve the following deficits and impairments:     Visit Diagnosis: Stiffness of left hand, not elsewhere classified - Plan: Ot plan of care cert/re-cert  Pain in right hand - Plan: Ot plan of care cert/re-cert      G-Codes - 83/38/25 1123    Functional Assessment Tool Used (Outpatient only) ROM , grip and prehension, palpation , clinical judgement ,assessment of splints     Functional Limitation Self care   Self Care Current Status (K5397) At least 1 percent but less than 20 percent impaired, limited or restricted   Self Care Goal Status (Q7341) At least 1 percent but less than 20 percent impaired, limited or restricted   Self Care Discharge Status [phone]) At least 1 percent but less than 20 percent impaired, limited or restricted      Problem List Patient Active Problem List   Diagnosis Date Noted  . Fatigue 02/17/2017  . Anemia 02/17/2017  . DOE (dyspnea on exertion) 01/28/2017  . Visit for preventive health examination 05/23/2016  . Cellulitis of groin 05/22/2016  . Hospital discharge follow-up 05/11/2016  . Hepatic cyst 05/04/2016  . TIA (transient ischemic attack) 05/03/2016  . Chronic systolic congestive heart failure ([place]) 05/03/2016  . Pacemaker lead failure 02/20/2016  . Peliosis hepatis 02/15/2016  . Prediabetes 02/15/2016  . Pacemaker 11/06/2015  . H/O mitral valve replacement 10/31/2015  . S/P Minimally invasive maze operation for atrial fibrillation 10/31/2015  . Atrial fibrillation, persistent ([place])   . Restrictive lung disease 09/12/2015  . Pulmonary hypertension ([place]) 08/08/2015  . Nodule of right lung 08/04/2015  . Lumbago 02/05/2015  . Thoracic aorta atherosclerosis ([place]) 02/05/2015  . Family history of cerebral aneurysm 08/02/2014  . Diverticulosis of colon without hemorrhage 01/09/2014  . Thrombocytopenia ([place]) 02/04/2013  . IBS (irritable bowel syndrome) 12/31/2012  . History of asbestos exposure 06/27/2012  . Osteoporosis of forearm 06/27/2012  . Anxiety associated with depression 06/25/2012  . Cataract extraction status of right eye 06/25/2012  . RBBB 05/05/2012  . Hyperlipidemia 08/06/2011  . Mitral valve prolapse 05/02/2011    Rosalyn Gess OTR/L,CLT 05/22/2017, 11:26 AM  [place]  [place] [facility] [address] [place], [address] Phone: [phone]    Fax:  [phone]  Name: [name] [name] MRN: [id number] Date of Birth: [birth year]

## 2017-05-22 NOTE — Patient Instructions (Signed)
Fitted with CMC neoprene splints for bilateral hands to use with activities that cause pain  And for support at [facility]  But with 3 point pinch on the L - pt actually relies to years of compensation - and has more pinch grip without splint   Pt ed on joint protection and AE - to decrease wear and tear on thumbs  Hand out reviewed and  Provided for pt

## 2017-05-23 ENCOUNTER — Encounter: Payer: Self-pay | Admitting: Internal Medicine

## 2017-05-23 ENCOUNTER — Ambulatory Visit (INDEPENDENT_AMBULATORY_CARE_PROVIDER_SITE_OTHER): Payer: Medicare Other | Admitting: Internal Medicine

## 2017-05-23 ENCOUNTER — Other Ambulatory Visit: Payer: Self-pay | Admitting: Internal Medicine

## 2017-05-23 VITALS — BP 102/60 | HR 90 | Ht 65.0 in | Wt 134.4 lb

## 2017-05-23 DIAGNOSIS — I495 Sick sinus syndrome: Secondary | ICD-10-CM | POA: Diagnosis not present

## 2017-05-23 DIAGNOSIS — Z950 Presence of cardiac pacemaker: Secondary | ICD-10-CM

## 2017-05-23 DIAGNOSIS — Z1231 Encounter for screening mammogram for malignant neoplasm of breast: Secondary | ICD-10-CM

## 2017-05-23 NOTE — Progress Notes (Signed)
HPI Ms. Laborde returns today for PPM followup. 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 She underwent PPM insertion. She notes that she had an episode where her hand would not work for about 5 minutes several weeks ago.  Allergies  Allergen Reactions  . Meloxicam Other (See Comments)    Severe reflux  . Codeine Nausea And Vomiting    migraine  . Doxycycline Itching and Swelling    Facial  . Hydrocodone Nausea And Vomiting    MIGRAINE  . Hydromorphone Nausea And Vomiting    migraine  . Oxycodone Nausea And Vomiting    Severe migraine     Current Outpatient Prescriptions  Medication Sig Dispense Refill  . albuterol (PROVENTIL HFA;VENTOLIN HFA) 108 (90 BASE) MCG/ACT inhaler Inhale 2 puffs into the lungs every 6 (six) hours as needed for wheezing or shortness of breath. 1 Inhaler 2  . ALPRAZolam (XANAX) 0.25 MG tablet Take 1 tablet (0.25 mg total) by mouth at bedtime as needed for anxiety or sleep. 30 tablet 5  . amoxicillin (AMOXIL) 500 MG capsule TAKE 4 CAPSULES BY MOUTH 60 MINUTES PRIOR TO DENTAL WORK 30 capsule 0  . azelastine (ASTELIN) 0.1 % nasal spray Place 1 spray into both nostrils as needed for allergies.   5  . b complex vitamins tablet Take 1 tablet by mouth daily.    . butalbital-acetaminophen-caffeine (FIORICET, ESGIC) 50-325-40 MG tablet TAKE 1 TABLET BY MOUTH AS NEEDED FOR BACK PAIN OR HEADACHE 90 tablet 2  . Cholecalciferol (VITAMIN D) 2000 units CAPS Take 2,000 Units by mouth daily.    . Coenzyme Q10 (CO Q 10) 100 MG CAPS Take 200 mg by mouth 2 (two) times daily.     Marland Kitchen ELIQUIS 5 MG TABS tablet TAKE 1 TABLET BY MOUTH TWICE DAILY 180 tablet 3  . FeFum-FePo-FA-B Cmp-C-Zn-Mn-Cu (SE-TAN PLUS) 162-115.2-1 MG CAPS TAKE 1 CAPSULE EVERY DAY WITH BREAKFAST 30 capsule 3  . furosemide (LASIX) 40 MG tablet Take 40 mg by mouth as directed.    . metoprolol tartrate (LOPRESSOR) 25 MG  tablet TAKE 1/2 TABLET BY MOUTH TWICE DAILY 90 tablet 0  . rosuvastatin (CRESTOR) 40 MG tablet TAKE 1 TABLET BY MOUTH DAILY 30 tablet 3   No current facility-administered medications for this visit.      Past Medical History:  Diagnosis Date  . Acute on chronic diastolic (congestive) heart failure ([place])   . Anxiety associated with depression    Prn alprazolam   . Atrial fibrillation, persistent ([place])    DCCV 08/22/2015  . Bradycardia post-op bradycardia, pacer dependent   MDT PPM 11/06/15, Dr. Lovena Le  . Diverticulosis   . Hemorrhoid   . Hepatic cyst    innumerable  . 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 Hyperlipidemia   . IBS (irritable bowel syndrome)   . Idiopathic thrombocytopenic purpura (ITP)   . Migraine   . MVP (mitral valve prolapse)   . Nodule of right lung   . RBBB   . Restrictive lung disease    Mild on PFT & likely cardiac in etiology   . S/P Minimally invasive maze operation for atrial fibrillation 10/31/2015   Complete bilateral atrial lesion set using cryothermy and bipolar radiofrequency ablation with clipping of LA appendage via right mini thoracotomy approach  . S/P minimally invasive mitral valve replacement with bioprosthetic valve 10/31/2015   33 mm [facility] Mitral  bovine bioprosthetic tissue valve placed via right mini thoracotomy approach  . Severe mitral regurgitation   . SVT (supraventricular tachycardia) ([place])   . Thoracic aorta atherosclerosis ([place])   . TIA (transient ischemic attack)     ROS:   All systems reviewed and negative except as noted in the HPI.   Past Surgical History:  Procedure Laterality Date  . BREAST BIOPSY Right Late 80s   Negative  . CARDIAC CATHETERIZATION N/A 10/18/2015   Procedure: Right/Left Heart Cath and Coronary Angiography;  Surgeon: Sherren Mocha, MD;  Location: [place] [facility];  Service: Cardiovascular;  Laterality: N/A;  . CARDIOVERSION N/A 08/22/2015   Procedure: CARDIOVERSION;  Surgeon: Thayer Headings,  MD;  Location: [place];  Service: Cardiovascular;  Laterality: N/A;  . COLONOSCOPY  2003  . EP IMPLANTABLE DEVICE N/A 11/06/2015   Procedure: Pacemaker Implant;  Surgeon: Evans Lance, MD;  Location: [place] [facility];  Service: Cardiovascular;  Laterality: N/A;  . EP IMPLANTABLE DEVICE N/A 02/20/2016   Procedure: Lead Extraction;  Surgeon: Evans Lance, MD;  Location: [place] [facility];  Service: Cardiovascular;  Laterality: N/A;  . EYE SURGERY Right 2013  . FOOT SURGERY    . MANDIBLE FRACTURE SURGERY  03-26-13  . MINIMALLY INVASIVE MAZE PROCEDURE N/A 10/31/2015   Procedure: MINIMALLY INVASIVE MAZE PROCEDURE;  Surgeon: Rexene Alberts, MD;  Location: [place];  Service: Open Heart Surgery;  Laterality: N/A;  . MITRAL VALVE REPLACEMENT Right 10/31/2015   Procedure: MINIMALLY INVASIVE MITRAL VALVE (MV) REPLACEMENT;  Surgeon: Rexene Alberts, MD;  Location: [place];  Service: Open Heart Surgery;  Laterality: Right;  . PACEMAKER LEAD REMOVAL  02/20/2016  . TEE WITH CARDIOVERSION    . TEE WITHOUT CARDIOVERSION N/A 08/22/2015   Procedure: TRANSESOPHAGEAL ECHOCARDIOGRAM (TEE);  Surgeon: Thayer Headings, MD;  Location: [place];  Service: Cardiovascular;  Laterality: N/A;  . TEE WITHOUT CARDIOVERSION N/A 10/31/2015   Procedure: TRANSESOPHAGEAL ECHOCARDIOGRAM (TEE);  Surgeon: Rexene Alberts, MD;  Location: [place];  Service: Open Heart Surgery;  Laterality: N/A;  . TUBAL LIGATION    . VARICOSE VEIN SURGERY Right      Family History  Problem Relation Age of Onset  . Hypertension Mother   . Arrhythmia Mother   . Heart failure Mother   . Arrhythmia Brother   . Stroke Brother 43       cerebral hemorrhage, nonsmoker, no HTN  . Prostate cancer Brother   . Stroke Father        from an aneurysm  . Stroke Maternal Aunt 83       cerebral hemorrhage  . Liver cancer Maternal Grandmother   . Atrial fibrillation Son   . Celiac disease Son   . Heart attack Neg Hx   . Breast cancer Neg Hx       Social History   Social History  . Marital status: Widowed    Spouse name: Deceased  . Number of children: 2  . Years of education: 16   Occupational History  . Retired    Social History Main Topics  . Smoking status: Never Smoker  . Smokeless tobacco: Never Used  . Alcohol use No  . Drug use: No  . Sexual activity: No   Other Topics Concern  . Not on file   Social History Narrative   She is a widow.  Husband died from metastatic renal cell cancer. Originally from [place]. Previously lived in [place] from 1975-[place]. Moved to  [place] in 2007. No mold exposure recently but did have it through a prior work exposure in 1993. Has a masters in public health. No bird exposure.     BP 102/60 Comment: left arm  Pulse 90   Ht 5\' 5"  (1.651 m)   Wt 134 lb 6.4 oz (61 kg)   SpO2 98%   BMI 22.37 kg/m   Physical Exam:  Well appearing 76 yo woman, NAD HEENT: Unremarkable Neck:  6 cm JVD, no thyromegally Lymphatics:  No adenopathy Back:  No CVA tenderness Lungs:  Clear with no wheezes HEART:  Regular rate rhythm, no murmurs, no rubs, no clicks Abd:  soft, positive bowel sounds, no organomegally, no rebound, no guarding Ext:  2 plus pulses, no edema, no cyanosis, no clubbing Skin:  No rashes no nodules Neuro:  CN II through XII intact, motor grossly intact   DEVICE  Normal device function except for elevated pacing thresholds.  See PaceArt for details.   Assess/Plan: 1. PPM lead revision - she is doing well and her device is working normally. 2. Atrial fibrillation - she is s/p MAZE. She will continue anti-coagulation and beta blocker therapy. She is maintaining NSR off of amiodarone. Her hand weakness was not caused by atrial fib. 3. Dyslipidemia - she will continue her statin therapy. 4. HTN - her blood pressure is well controlled. No change in meds.   Mikle Bosworth.D.

## 2017-05-23 NOTE — Patient Instructions (Addendum)
Medication Instructions:  Your physician recommends that you continue on your current medications as directed. Please refer to the Current Medication list given to you today.   Labwork: None Ordered   Testing/Procedures: None Ordered   Follow-Up: Your physician wants you to follow-up in: 1 year with Dr. Lovena Le. You will receive a reminder letter in the mail two months in advance. If you don't receive a letter, please call our office to schedule the follow-up appointment.  Remote monitoring is used to monitor your Pacemaker from home. This monitoring reduces the number of office visits required to check your device to one time per year. It allows [place] to keep an eye on the functioning of your device to ensure it is working properly. You are scheduled for a device check from home on  08/25/17. You may send your transmission at any time that day. If you have a wireless device, the transmission will be sent automatically. After your physician reviews your transmission, you will receive a postcard with your next transmission date.    Any Other Special Instructions Will Be Listed Below (If Applicable).     If you need a refill on your cardiac medications before your next appointment, please call your pharmacy.

## 2017-05-26 LAB — CUP PACEART INCLINIC DEVICE CHECK
Battery Impedance: 136 Ohm
Battery Remaining Longevity: 117 mo
Battery Voltage: 2.79 V
Brady Statistic AP VP Percent: 95 %
Brady Statistic AP VS Percent: 0 %
Brady Statistic AS VP Percent: 5 %
Brady Statistic AS VS Percent: 0 %
Date Time Interrogation Session: 20180622134103
Implantable Lead Implant Date: 20161205
Implantable Lead Implant Date: 20170321
Implantable Lead Location: 753859
Implantable Lead Location: 753860
Implantable Lead Model: 5076
Implantable Lead Model: 5076
Implantable Pulse Generator Implant Date: 20161205
Lead Channel Impedance Value: 479 Ohm
Lead Channel Impedance Value: 760 Ohm
Lead Channel Pacing Threshold Amplitude: 0.5 V
Lead Channel Pacing Threshold Amplitude: 0.75 V
Lead Channel Pacing Threshold Pulse Width: 0.4 ms
Lead Channel Pacing Threshold Pulse Width: 0.4 ms
Lead Channel Sensing Intrinsic Amplitude: 1 mV
Lead Channel Sensing Intrinsic Amplitude: 5.6 mV
Lead Channel Setting Pacing Amplitude: 2 V
Lead Channel Setting Pacing Amplitude: 2.5 V
Lead Channel Setting Pacing Pulse Width: 0.4 ms
Lead Channel Setting Sensing Sensitivity: 4 mV

## 2017-05-27 ENCOUNTER — Ambulatory Visit (INDEPENDENT_AMBULATORY_CARE_PROVIDER_SITE_OTHER): Payer: Medicare Other

## 2017-05-27 VITALS — BP 102/60 | HR 86 | Temp 97.9°F | Resp 12 | Ht 65.5 in | Wt 134.4 lb

## 2017-05-27 DIAGNOSIS — Z0000 Encounter for general adult medical examination without abnormal findings: Secondary | ICD-10-CM

## 2017-05-27 NOTE — Progress Notes (Signed)
Subjective:   [name] [name] is a 76 y.o. female who presents for Medicare Annual (Subsequent) preventive examination.  Review of Systems:  No ROS.  Medicare Wellness Visit. Additional risk factors are reflected in the social history. Cardiac Risk Factors include: advanced age (>53men, >28 women)     Objective:     Vitals: BP 102/60 (BP Location: Left Arm, Patient Position: Sitting, Cuff Size: Normal)   Pulse 86   Temp 97.9 F (36.6 C) (Oral)   Resp 12   Ht 5' 5.5" (1.664 m)   Wt 134 lb 6.4 oz (61 kg)   SpO2 96%   BMI 22.03 kg/m   Body mass index is 22.03 kg/m.   Tobacco History  Smoking Status  . Never Smoker  Smokeless Tobacco  . Never Used     Counseling given: Not Answered   Past Medical History:  Diagnosis Date  . Acute on chronic diastolic (congestive) heart failure ([place])   . Anxiety associated with depression    Prn alprazolam   . Atrial fibrillation, persistent ([place])    DCCV 08/22/2015  . Bradycardia post-op bradycardia, pacer dependent   MDT PPM 11/06/15, Dr. Lovena Le  . Diverticulosis   . Hemorrhoid   . Hepatic cyst    innumerable  . 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 Hyperlipidemia   . IBS (irritable bowel syndrome)   . Idiopathic thrombocytopenic purpura (ITP)   . Migraine   . MVP (mitral valve prolapse)   . Nodule of right lung   . RBBB   . Restrictive lung disease    Mild on PFT & likely cardiac in etiology   . S/P Minimally invasive maze operation for atrial fibrillation 10/31/2015   Complete bilateral atrial lesion set using cryothermy and bipolar radiofrequency ablation with clipping of LA appendage via right mini thoracotomy approach  . S/P minimally invasive mitral valve replacement with bioprosthetic valve 10/31/2015   33 mm [facility] Mitral bovine bioprosthetic tissue valve placed via right mini thoracotomy approach  . Severe mitral regurgitation   . SVT (supraventricular tachycardia) ([place])   . Thoracic aorta atherosclerosis  ([place])   . TIA (transient ischemic attack)    Past Surgical History:  Procedure Laterality Date  . BREAST BIOPSY Right Late 80s   Negative  . CARDIAC CATHETERIZATION N/A 10/18/2015   Procedure: Right/Left Heart Cath and Coronary Angiography;  Surgeon: Sherren Mocha, MD;  Location: [place] [facility];  Service: Cardiovascular;  Laterality: N/A;  . CARDIOVERSION N/A 08/22/2015   Procedure: CARDIOVERSION;  Surgeon: Thayer Headings, MD;  Location: [place];  Service: Cardiovascular;  Laterality: N/A;  . COLONOSCOPY  2003  . EP IMPLANTABLE DEVICE N/A 11/06/2015   Procedure: Pacemaker Implant;  Surgeon: Evans Lance, MD;  Location: [place] [facility];  Service: Cardiovascular;  Laterality: N/A;  . EP IMPLANTABLE DEVICE N/A 02/20/2016   Procedure: Lead Extraction;  Surgeon: Evans Lance, MD;  Location: [place] [facility];  Service: Cardiovascular;  Laterality: N/A;  . EYE SURGERY Right 2013  . FOOT SURGERY    . MANDIBLE FRACTURE SURGERY  03-26-13  . MINIMALLY INVASIVE MAZE PROCEDURE N/A 10/31/2015   Procedure: MINIMALLY INVASIVE MAZE PROCEDURE;  Surgeon: Rexene Alberts, MD;  Location: [place];  Service: Open Heart Surgery;  Laterality: N/A;  . MITRAL VALVE REPLACEMENT Right 10/31/2015   Procedure: MINIMALLY INVASIVE MITRAL VALVE (MV) REPLACEMENT;  Surgeon: Rexene Alberts, MD;  Location: [place];  Service: Open Heart Surgery;  Laterality: Right;  .  PACEMAKER LEAD REMOVAL  02/20/2016  . TEE WITH CARDIOVERSION    . TEE WITHOUT CARDIOVERSION N/A 08/22/2015   Procedure: TRANSESOPHAGEAL ECHOCARDIOGRAM (TEE);  Surgeon: Thayer Headings, MD;  Location: [place];  Service: Cardiovascular;  Laterality: N/A;  . TEE WITHOUT CARDIOVERSION N/A 10/31/2015   Procedure: TRANSESOPHAGEAL ECHOCARDIOGRAM (TEE);  Surgeon: Rexene Alberts, MD;  Location: [place];  Service: Open Heart Surgery;  Laterality: N/A;  . TUBAL LIGATION    . VARICOSE VEIN SURGERY Right    Family History  Problem Relation Age of Onset    . Hypertension Mother   . Arrhythmia Mother   . Heart failure Mother   . Arrhythmia Brother   . Stroke Brother 3       cerebral hemorrhage, nonsmoker, no HTN  . Prostate cancer Brother   . Stroke Father        from an aneurysm  . Stroke Maternal Aunt 83       cerebral hemorrhage  . Liver cancer Maternal Grandmother   . Atrial fibrillation Son   . Celiac disease Son   . Heart attack Neg Hx   . Breast cancer Neg Hx    History  Sexual Activity  . Sexual activity: No    Outpatient Encounter Prescriptions as of 05/27/2017  Medication Sig  . albuterol (PROVENTIL HFA;VENTOLIN HFA) 108 (90 BASE) MCG/ACT inhaler Inhale 2 puffs into the lungs every 6 (six) hours as needed for wheezing or shortness of breath.  . ALPRAZolam (XANAX) 0.25 MG tablet Take 1 tablet (0.25 mg total) by mouth at bedtime as needed for anxiety or sleep.  Marland Kitchen amoxicillin (AMOXIL) 500 MG capsule TAKE 4 CAPSULES BY MOUTH 60 MINUTES PRIOR TO DENTAL WORK  . azelastine (ASTELIN) 0.1 % nasal spray Place 1 spray into both nostrils as needed for allergies.   Marland Kitchen b complex vitamins tablet Take 1 tablet by mouth daily.  . butalbital-acetaminophen-caffeine (FIORICET, ESGIC) 50-325-40 MG tablet TAKE 1 TABLET BY MOUTH AS NEEDED FOR BACK PAIN OR HEADACHE  . Cholecalciferol (VITAMIN D) 2000 units CAPS Take 2,000 Units by mouth daily.  . Coenzyme Q10 (CO Q 10) 100 MG CAPS Take 200 mg by mouth 2 (two) times daily.   Marland Kitchen ELIQUIS 5 MG TABS tablet TAKE 1 TABLET BY MOUTH TWICE DAILY  . FeFum-FePo-FA-B Cmp-C-Zn-Mn-Cu (SE-TAN PLUS) 162-115.2-1 MG CAPS TAKE 1 CAPSULE EVERY DAY WITH BREAKFAST  . furosemide (LASIX) 40 MG tablet Take 40 mg by mouth as directed.  . metoprolol tartrate (LOPRESSOR) 25 MG tablet TAKE 1/2 TABLET BY MOUTH TWICE DAILY  . rosuvastatin (CRESTOR) 40 MG tablet TAKE 1 TABLET BY MOUTH DAILY   No facility-administered encounter medications on file as of 05/27/2017.     Activities of Daily Living In your present state of  health, do you have any difficulty performing the following activities: 05/27/2017  Hearing? N  Vision? N  Difficulty concentrating or making decisions? N  Walking or climbing stairs? N  Dressing or bathing? N  Doing errands, shopping? N  Preparing Food and eating ? N  Using the Toilet? N  In the past six months, have you accidently leaked urine? N  Do you have problems with loss of bowel control? N  Managing your Medications? N  Managing your Finances? N  Housekeeping or managing your Housekeeping? N  Some recent data might be hidden    Patient Care Team: [name], MD as PCP - General (Internal Medicine) Bary Castilla, Forest Gleason, MD (General Surgery) [name],  MD (Internal Medicine)    Assessment:    This is a routine wellness examination for [place]. The goal of the wellness visit is to assist the patient how to close the gaps in care and create a preventative care plan for the patient.   The roster of all physicians providing medical care to patient is listed in the Snapshot section of the chart.  Taking calcium VIT D as appropriate/Osteoporosis reviewed.    Safety issues reviewed; Smoke and carbon monoxide detectors in the home. No firearms in the home.  Wears seatbelts when driving or riding with others. Patient does wear sunscreen or protective clothing when in direct sunlight. No violence in the home.  Patient is alert, normal appearance, oriented to person/place/and time.  Correctly identified the president of the [place], recall of 3/3 words, and performing simple calculations. Displays appropriate judgement and can read correct time from watch face.   No new identified risk were noted.  No failures at ADL's or IADL's.   BMI- discussed the importance of a healthy diet, water intake and the benefits of aerobic exercise. Educational material provided.   Daily fluid intake: 0 cups of caffeine, 6 cups of water  Dental- every 4 months.  Dr. Jacobo Forest.  Eye- Visual  acuity not assessed per patient preference since they have regular follow up with the ophthalmologist.  Wears corrective lenses.  Sleep patterns- Sleeps 6-7 hours at night.  Wakes feeling rested.  Health maintenance gaps- closed.  148.1 Persistent atril fib-stable and followed by Cardiologist, Dr. Crissie Sickles and Dr. Rosey Bath.  Patient Concerns: None at this time. Follow up with PCP as needed.  Exercise Activities and Dietary recommendations Current Exercise Habits: Structured exercise class, Type of exercise: treadmill;calisthenics;stretching (yoga), Time (Minutes): 35, Frequency (Times/Week): 3, Weekly Exercise (Minutes/Week): 105, Intensity: Moderate  Goals    . Healthy Lifestyle          Gluten free, low carb foods Stay hydrated and drink plenty of fluids/water Stay active and maintain exercise regimen      Fall Risk Fall Risk  05/27/2017 02/17/2017 11/08/2016 11/29/2015 02/02/2015  Falls in the past year? No No No No No  Number falls in past yr: - - - - -  Injury with Fall? - - - - -  Risk Factor Category  - - - - -  Risk for fall due to : - - - History of fall(s) -   Depression Screen PHQ 2/9 Scores 05/27/2017 02/17/2017 02/07/2016 11/29/2015  PHQ - 2 Score 0 0 0 0  PHQ- 9 Score - - 0 2     Cognitive Function MMSE - Mini Mental State Exam 05/27/2017  Orientation to time 5  Orientation to Place 5  Registration 3  Attention/ Calculation 5  Recall 3  Language- name 2 objects 2  Language- repeat 1  Language- follow 3 step command 3  Language- read & follow direction 1  Write a sentence 1  Copy design 1  Total score 30        Immunization History  Administered Date(s) Administered  . Hep A / Hep B 05/09/2016, 06/11/2016, 11/12/2016  . Influenza Split 10/10/2013  . Influenza Whole 10/10/2011  . Influenza, High Dose Seasonal PF 08/19/2016  . Influenza,inj,Quad PF,36+ Mos 08/01/2014, 08/04/2015  . Pneumococcal Conjugate-13 01/10/2014  . Pneumococcal Polysaccharide-23  06/25/2012  . Tdap 06/25/2012  . Zoster 01/10/2006   Screening Tests Health Maintenance  Topic Date Due  . INFLUENZA VACCINE  07/02/2017  . TETANUS/TDAP  06/25/2022  .  DEXA SCAN  Completed  . PNA vac Low Risk Adult  Completed      Plan:    End of life planning; Advance aging; Advanced directives discussed. Copy of current HCPOA/Living Will on file.    I have personally reviewed and noted the following in the patient's chart:   . Medical and social history . Use of alcohol, tobacco or illicit drugs  . Current medications and supplements . Functional ability and status . Nutritional status . Physical activity . Advanced directives . List of other physicians . Hospitalizations, surgeries, and ER visits in previous 12 months . Vitals . Screenings to include cognitive, depression, and falls . Referrals and appointments  In addition, I have reviewed and discussed with patient certain preventive protocols, quality metrics, and best practice recommendations. A written personalized care plan for preventive services as well as general preventive health recommendations were provided to patient.     Varney Biles, LPN  0/08/2329   Reviewed above information.  Agree with assessment and plan.    Dr Nicki Reaper

## 2017-05-27 NOTE — Patient Instructions (Addendum)
  Ms. Ranes , Thank you for taking time to come for your Medicare Wellness Visit. I appreciate your ongoing commitment to your health goals. Please review the following plan we discussed and let me know if I can assist you in the future.   Follow up with Dr. Derrel Nip as needed.    Make fasting lab appointment for 06/24/17.  Have a great day!  These are the goals we discussed: Goals    . Healthy Lifestyle          Gluten free, low carb foods Stay hydrated and drink plenty of fluids/water Stay active and maintain exercise regimen       This is a list of the screening recommended for you and due dates:  Health Maintenance  Topic Date Due  . Flu Shot  07/02/2017  . Tetanus Vaccine  06/25/2022  . DEXA scan (bone density measurement)  Completed  . Pneumonia vaccines  Completed

## 2017-05-28 ENCOUNTER — Telehealth: Payer: Self-pay | Admitting: Internal Medicine

## 2017-05-28 NOTE — Telephone Encounter (Signed)
Pt called and stated that she either has poison ivy or sumac. She states that it is spreading and it is on her left side of her face. Please advise, thank you!  Call pt @ [phone]

## 2017-05-28 NOTE — Telephone Encounter (Signed)
Spoke to patient and scheduled her an appointment with DR.COOK at 1530 tomorrow.  If SX become worse before appointment I instructed patient to go to ED/UC. Patient stated she will comply.

## 2017-05-29 ENCOUNTER — Encounter: Payer: Self-pay | Admitting: Family Medicine

## 2017-05-29 ENCOUNTER — Ambulatory Visit (INDEPENDENT_AMBULATORY_CARE_PROVIDER_SITE_OTHER): Payer: Medicare Other | Admitting: Family Medicine

## 2017-05-29 DIAGNOSIS — L255 Unspecified contact dermatitis due to plants, except food: Secondary | ICD-10-CM | POA: Insufficient documentation

## 2017-05-29 MED ORDER — PREDNISONE 10 MG PO TABS: ORAL_TABLET | ORAL | 0 refills | Status: DC

## 2017-05-29 NOTE — Progress Notes (Signed)
Subjective:  Patient ID: [name] [name], female    DOB: June 24, 1941  Age: 76 y.o. MRN: [id number]  CC: Rash  HPI:  76 year old female with an extensive past medical history presents with complaints of rash.  Started on 6/20. Started shortly after she was pulling weeds in the yard. She reports that she subsequently developed a blistery, red rash with associated itching. Located on the arms and hands and now on the face. She's been using topical hydrocortisone with some improvement initially. She is now having continued rash and severe pruritus. She thinks she may have poison oak or poison ivy. No other associated symptoms. No other complaints at this time.  Social Hx   Social History   Social History  . Marital status: Widowed    Spouse name: Deceased  . Number of children: 2  . Years of education: 16   Occupational History  . Retired    Social History Main Topics  . Smoking status: Never Smoker  . Smokeless tobacco: Never Used  . Alcohol use No  . Drug use: No  . Sexual activity: No   Other Topics Concern  . None   Social History Narrative   She is a widow.  Husband died from metastatic renal cell cancer. Originally from [place]. Previously lived in [place] from 1975-[place]. Moved to [place] in 2007. No mold exposure recently but did have it through a prior work exposure in 1993. Has a masters in public health. No bird exposure.   Review of Systems  Constitutional: Negative.   Skin: Positive for rash.   Objective:  BP 118/76 (BP Location: Left Arm, Patient Position: Sitting, Cuff Size: Small)   Pulse 72   Temp 97.8 F (36.6 C) (Oral)   Wt 136 lb 8 oz (61.9 kg)   SpO2 99%   BMI 22.37 kg/m   BP/Weight 05/29/2017 05/27/2017 1/93/7902  Systolic BP 409 735 329  Diastolic BP 76 60 60  Wt. (Lbs) 136.5 134.4 134.4  BMI 22.37 22.03 22.37    Physical Exam  Constitutional: She is oriented to person, place, and time. She appears well-developed. No distress.  HENT:  Head:  Normocephalic and atraumatic.  Pulmonary/Chest: Effort normal. No respiratory distress.  Neurological: She is alert and oriented to person, place, and time.  Skin:  Erythematous, vesicular rash noted on the arms, hands, face.  Psychiatric: She has a normal mood and affect.  Vitals reviewed.  Lab Results  Component Value Date   WBC 5.4 05/06/2017   HGB 10.8 (L) 05/06/2017   HCT 32.6 (L) 05/06/2017   PLT 117 (L) 05/06/2017   GLUCOSE 98 05/06/2017   CHOL 138 02/10/2017   TRIG 45.0 02/10/2017   HDL 59.40 02/10/2017   LDLDIRECT 113.0 02/15/2016   LDLCALC 69 02/10/2017   ALT 31 05/06/2017   AST 41 05/06/2017   NA 138 05/06/2017   K 4.1 05/06/2017   CL 104 05/06/2017   CREATININE 0.82 05/06/2017   BUN 31 (H) 05/06/2017   CO2 27 05/06/2017   TSH 3.29 02/17/2017   INR 1.11 05/06/2017   HGBA1C 5.7 02/10/2017    Assessment & Plan:   Problem List Items Addressed This Visit      Musculoskeletal and Integument   Dermatitis due to plants, including poison ivy, sumac, and oak    New problem. Treating with prednisone taper.        Meds ordered this encounter  Medications  . predniSONE (DELTASONE) 10 MG tablet    Sig: 5 tablets  daily x 3 days, then 4 tablets daily x 3 days, then 3 tablets daily x 3 days, then 2 tablets daily x 3 days, then 1 tablet daily x 3 days.    Dispense:  45 tablet    Refill:  0   Follow-up: PRN  [place]

## 2017-05-29 NOTE — Assessment & Plan Note (Signed)
New problem. Treating with prednisone taper. 

## 2017-05-29 NOTE — Patient Instructions (Signed)
Prednisone as prescribed.  Take care  Dr. Lacinda Axon    Poison Ivy Dermatitis Poison ivy dermatitis is inflammation of the skin that is caused by the allergens on the leaves of the poison ivy plant. The skin reaction often involves redness, swelling, blisters, and extreme itching. What are the causes? This condition is caused by a specific chemical (urushiol) found in the sap of the poison ivy plant. This chemical is sticky and can be easily spread to people, animals, and objects. You can get poison ivy dermatitis by:  Having direct contact with a poison ivy plant.  Touching animals, other people, or objects that have come in contact with poison ivy and have the chemical on them.  What increases the risk? This condition is more likely to develop in:  People who are outdoors often.  People who go outdoors without wearing protective clothing, such as closed shoes, long pants, and a long-sleeved shirt.  What are the signs or symptoms? Symptoms of this condition include:  Redness and itching.  A rash that often includes bumps and blisters. The rash usually appears 48 hours after exposure.  Swelling. This may occur if the reaction is more severe.  Symptoms usually last for 1-2 weeks. However, the first time you develop this condition, symptoms may last 3-4 weeks. How is this diagnosed? This condition may be diagnosed based on your symptoms and a physical exam. Your health care provider may also ask you about any recent outdoor activity. How is this treated? Treatment for this condition will vary depending on how severe it is. Treatment may include:  Hydrocortisone creams or calamine lotions to relieve itching.  Oatmeal baths to soothe the skin.  Over-the-counter antihistamine tablets.  Oral steroid medicine for more severe outbreaks.  Follow these instructions at home:  Take or apply over-the-counter and prescription medicines only as told by your health care provider.  Wash  exposed skin as soon as possible with soap and cold water.  Use hydrocortisone creams or calamine lotion as needed to soothe the skin and relieve itching.  Take oatmeal baths as needed. Use colloidal oatmeal. You can get this at your local pharmacy or grocery store. Follow the instructions on the packaging.  Do not scratch or rub your skin.  While you have the rash, wash clothes right after you wear them. How is this prevented?  Learn to identify the poison ivy plant and avoid contact with the plant. This plant can be recognized by the number of leaves. Generally, poison ivy has three leaves with flowering branches on a single stem. The leaves are typically glossy, and they have jagged edges that come to a point at the front.  If you have been exposed to poison ivy, thoroughly wash with soap and water right away. You have about 30 minutes to remove the plant resin before it will cause the rash. Be sure to wash under your fingernails because any plant resin there will continue to spread the rash.  When hiking or camping, wear clothes that will help you to avoid exposure on the skin. This includes long pants, a long-sleeved shirt, tall socks, and hiking boots. You can also apply preventive lotion to your skin to help limit exposure.  If you suspect that your clothes or outdoor gear came in contact with poison ivy, rinse them off outside with a garden hose before you bring them inside your house. Contact a health care provider if:  You have open sores in the rash area.  You have  more redness, swelling, or pain in the affected area.  You have redness that spreads beyond the rash area.  You have fluid, blood, or pus coming from the affected area.  You have a fever.  You have a rash over a large area of your body.  You have a rash on your eyes, mouth, or genitals.  Your rash does not improve after a few days. Get help right away if:  Your face swells or your eyes swell shut.  You  have trouble breathing.  You have trouble swallowing. This information is not intended to replace advice given to you by your health care provider. Make sure you discuss any questions you have with your health care provider. Document Released: 11/15/2000 Document Revised: 04/25/2016 Document Reviewed: 04/26/2015 Elsevier Interactive Patient Education  [employer].

## 2017-06-24 ENCOUNTER — Other Ambulatory Visit (INDEPENDENT_AMBULATORY_CARE_PROVIDER_SITE_OTHER): Payer: Medicare Other

## 2017-06-24 DIAGNOSIS — D509 Iron deficiency anemia, unspecified: Secondary | ICD-10-CM | POA: Diagnosis not present

## 2017-06-24 LAB — CBC WITH DIFFERENTIAL/PLATELET
Basophils Absolute: 0 10*3/uL (ref 0.0–0.1)
Basophils Relative: 0.7 % (ref 0.0–3.0)
Eosinophils Absolute: 0.2 10*3/uL (ref 0.0–0.7)
Eosinophils Relative: 3.7 % (ref 0.0–5.0)
HCT: 33.3 % — ABNORMAL LOW (ref 36.0–46.0)
Hemoglobin: 11.2 g/dL — ABNORMAL LOW (ref 12.0–15.0)
Lymphocytes Relative: 30.2 % (ref 12.0–46.0)
Lymphs Abs: 1.3 10*3/uL (ref 0.7–4.0)
MCHC: 33.6 g/dL (ref 30.0–36.0)
MCV: 89.7 fl (ref 78.0–100.0)
Monocytes Absolute: 0.3 10*3/uL (ref 0.1–1.0)
Monocytes Relative: 6.6 % (ref 3.0–12.0)
Neutro Abs: 2.5 10*3/uL (ref 1.4–7.7)
Neutrophils Relative %: 58.8 % (ref 43.0–77.0)
Platelets: 101 10*3/uL — ABNORMAL LOW (ref 150.0–400.0)
RBC: 3.71 Mil/uL — ABNORMAL LOW (ref 3.87–5.11)
RDW: 15.6 % — ABNORMAL HIGH (ref 11.5–15.5)
WBC: 4.2 10*3/uL (ref 4.0–10.5)

## 2017-06-24 LAB — IBC PANEL
Iron: 57 ug/dL (ref 42–145)
Saturation Ratios: 17 % — ABNORMAL LOW (ref 20.0–50.0)
Transferrin: 239 mg/dL (ref 212.0–360.0)

## 2017-06-24 LAB — FERRITIN: Ferritin: 28.4 ng/mL (ref 10.0–291.0)

## 2017-06-25 ENCOUNTER — Encounter: Payer: Self-pay | Admitting: Internal Medicine

## 2017-06-25 ENCOUNTER — Other Ambulatory Visit: Payer: Self-pay | Admitting: Internal Medicine

## 2017-06-25 MED ORDER — FLUCONAZOLE 150 MG PO TABS: 150.0000 mg | ORAL_TABLET | Freq: Every day | ORAL | 0 refills | Status: DC

## 2017-06-27 ENCOUNTER — Encounter: Payer: Self-pay | Admitting: Internal Medicine

## 2017-07-14 ENCOUNTER — Other Ambulatory Visit: Payer: Self-pay | Admitting: Cardiovascular Disease

## 2017-07-16 ENCOUNTER — Ambulatory Visit
Admission: RE | Admit: 2017-07-16 | Discharge: 2017-07-16 | Disposition: A | Discharge status: Home / Self Care | Payer: Medicare Other | Source: Ambulatory Visit | Attending: Internal Medicine | Admitting: Internal Medicine

## 2017-07-16 DIAGNOSIS — Z1231 Encounter for screening mammogram for malignant neoplasm of breast: Secondary | ICD-10-CM | POA: Insufficient documentation

## 2017-07-16 HISTORY — DX: Age-related osteoporosis without current pathological fracture: M81.0

## 2017-07-17 ENCOUNTER — Telehealth: Payer: Self-pay | Admitting: Internal Medicine

## 2017-07-17 NOTE — Telephone Encounter (Signed)
Pt dropped off clearance form to be filled out for [facility]. Placed in Dr. Demetrios Isaacs folder upfront. Please fax when completed to [phone]

## 2017-07-17 NOTE — Telephone Encounter (Signed)
Placed in red folder  

## 2017-07-17 NOTE — Telephone Encounter (Signed)
Signed and returned,   No charge

## 2017-07-18 ENCOUNTER — Telehealth: Payer: Self-pay | Admitting: Internal Medicine

## 2017-07-18 MED ORDER — FLUCONAZOLE 150 MG PO TABS: 150.0000 mg | ORAL_TABLET | Freq: Every day | ORAL | 0 refills | Status: DC

## 2017-07-18 NOTE — Telephone Encounter (Signed)
Pt called about the itching that came back. Pt wants to see if she can get a refill on the medication that she was on before it was diflucan.  Call pt @ [phone].  Pharmacy is [facility], [place] Thank you!

## 2017-07-18 NOTE — Telephone Encounter (Signed)
Spoke with pt and she stated that she was seen by Dr. Derrel Nip at the end of July and was prescribed two diflucan pills and was told that if it didn't work then she would need to come back in the following week. The pt stated that it did work but now the itching and irritation has come back and was wondering if the medication could be refilled?

## 2017-07-18 NOTE — Telephone Encounter (Signed)
Form has been completed and faxed.

## 2017-07-18 NOTE — Telephone Encounter (Signed)
Pt was notified.  

## 2017-07-18 NOTE — Telephone Encounter (Signed)
MyChart message sent , med refilled

## 2017-07-28 ENCOUNTER — Telehealth: Payer: Self-pay | Admitting: Internal Medicine

## 2017-07-28 NOTE — Telephone Encounter (Signed)
PT dropped off a letter for Dr. Derrel Nip. Letter is up front in Dr. Lupita Dawn color folder.

## 2017-07-28 NOTE — Telephone Encounter (Signed)
Placed in quick sign folder.  

## 2017-08-07 ENCOUNTER — Ambulatory Visit (INDEPENDENT_AMBULATORY_CARE_PROVIDER_SITE_OTHER)
Admission: RE | Admit: 2017-08-07 | Discharge: 2017-08-07 | Disposition: A | Discharge status: Home / Self Care | Payer: Medicare Other | Source: Ambulatory Visit | Attending: Pulmonary Disease | Admitting: Pulmonary Disease

## 2017-08-07 DIAGNOSIS — R911 Solitary pulmonary nodule: Secondary | ICD-10-CM | POA: Diagnosis not present

## 2017-08-07 DIAGNOSIS — IMO0001 Reserved for inherently not codable concepts without codable children: Secondary | ICD-10-CM

## 2017-08-12 ENCOUNTER — Other Ambulatory Visit: Payer: Self-pay | Admitting: Internal Medicine

## 2017-08-24 ENCOUNTER — Telehealth: Payer: Self-pay | Admitting: Internal Medicine

## 2017-08-24 NOTE — Telephone Encounter (Signed)
Appointment moved from Monday to tuesday 9:00 am   Sept 25  Per phone call

## 2017-08-25 ENCOUNTER — Ambulatory Visit: Payer: Medicare Other | Admitting: Internal Medicine

## 2017-08-25 ENCOUNTER — Ambulatory Visit (INDEPENDENT_AMBULATORY_CARE_PROVIDER_SITE_OTHER): Payer: Medicare Other | Admitting: *Deleted

## 2017-08-25 DIAGNOSIS — I495 Sick sinus syndrome: Secondary | ICD-10-CM | POA: Diagnosis not present

## 2017-08-25 NOTE — Progress Notes (Signed)
Remote pacemaker transmission.   

## 2017-08-26 ENCOUNTER — Encounter: Payer: Self-pay | Admitting: Internal Medicine

## 2017-08-26 ENCOUNTER — Ambulatory Visit (INDEPENDENT_AMBULATORY_CARE_PROVIDER_SITE_OTHER): Payer: Medicare Other | Admitting: Internal Medicine

## 2017-08-26 ENCOUNTER — Telehealth: Payer: Self-pay | Admitting: *Deleted

## 2017-08-26 VITALS — BP 94/56 | HR 85 | Temp 97.6°F | Resp 15 | Ht 65.5 in | Wt 136.8 lb

## 2017-08-26 DIAGNOSIS — E7800 Pure hypercholesterolemia, unspecified: Secondary | ICD-10-CM | POA: Diagnosis not present

## 2017-08-26 DIAGNOSIS — M810 Age-related osteoporosis without current pathological fracture: Secondary | ICD-10-CM | POA: Diagnosis not present

## 2017-08-26 DIAGNOSIS — I5022 Chronic systolic (congestive) heart failure: Secondary | ICD-10-CM

## 2017-08-26 DIAGNOSIS — Z9109 Other allergy status, other than to drugs and biological substances: Secondary | ICD-10-CM | POA: Diagnosis not present

## 2017-08-26 DIAGNOSIS — R911 Solitary pulmonary nodule: Secondary | ICD-10-CM | POA: Diagnosis not present

## 2017-08-26 DIAGNOSIS — K7689 Other specified diseases of liver: Secondary | ICD-10-CM | POA: Diagnosis not present

## 2017-08-26 DIAGNOSIS — D509 Iron deficiency anemia, unspecified: Secondary | ICD-10-CM | POA: Diagnosis not present

## 2017-08-26 DIAGNOSIS — G458 Other transient cerebral ischemic attacks and related syndromes: Secondary | ICD-10-CM | POA: Diagnosis not present

## 2017-08-26 DIAGNOSIS — E559 Vitamin D deficiency, unspecified: Secondary | ICD-10-CM

## 2017-08-26 DIAGNOSIS — D696 Thrombocytopenia, unspecified: Secondary | ICD-10-CM | POA: Diagnosis not present

## 2017-08-26 DIAGNOSIS — R7303 Prediabetes: Secondary | ICD-10-CM | POA: Diagnosis not present

## 2017-08-26 LAB — CUP PACEART REMOTE DEVICE CHECK
Battery Impedance: 184 Ohm
Battery Remaining Longevity: 105 mo
Battery Voltage: 2.79 V
Brady Statistic AP VP Percent: 97 %
Brady Statistic AP VS Percent: 0 %
Brady Statistic AS VP Percent: 3 %
Brady Statistic AS VS Percent: 0 %
Date Time Interrogation Session: 20180924130239
Implantable Lead Implant Date: 20161205
Implantable Lead Implant Date: 20170321
Implantable Lead Location: 753859
Implantable Lead Location: 753860
Implantable Lead Model: 5076
Implantable Lead Model: 5076
Implantable Pulse Generator Implant Date: 20161205
Lead Channel Impedance Value: 459 Ohm
Lead Channel Impedance Value: 703 Ohm
Lead Channel Pacing Threshold Amplitude: 0.5 V
Lead Channel Pacing Threshold Amplitude: 0.5 V
Lead Channel Pacing Threshold Pulse Width: 0.4 ms
Lead Channel Pacing Threshold Pulse Width: 0.4 ms
Lead Channel Setting Pacing Amplitude: 2 V
Lead Channel Setting Pacing Amplitude: 2.5 V
Lead Channel Setting Pacing Pulse Width: 0.4 ms
Lead Channel Setting Sensing Sensitivity: 4 mV

## 2017-08-26 LAB — CBC WITH DIFFERENTIAL/PLATELET
Basophils Absolute: 0 10*3/uL (ref 0.0–0.1)
Basophils Relative: 0.8 % (ref 0.0–3.0)
Eosinophils Absolute: 0.1 10*3/uL (ref 0.0–0.7)
Eosinophils Relative: 2.7 % (ref 0.0–5.0)
HCT: 31.9 % — ABNORMAL LOW (ref 36.0–46.0)
Hemoglobin: 10.6 g/dL — ABNORMAL LOW (ref 12.0–15.0)
Lymphocytes Relative: 29.2 % (ref 12.0–46.0)
Lymphs Abs: 1.1 10*3/uL (ref 0.7–4.0)
MCHC: 33.1 g/dL (ref 30.0–36.0)
MCV: 94.1 fl (ref 78.0–100.0)
Monocytes Absolute: 0.3 10*3/uL (ref 0.1–1.0)
Monocytes Relative: 8.5 % (ref 3.0–12.0)
Neutro Abs: 2.3 10*3/uL (ref 1.4–7.7)
Neutrophils Relative %: 58.8 % (ref 43.0–77.0)
Platelets: 109 10*3/uL — ABNORMAL LOW (ref 150.0–400.0)
RBC: 3.39 Mil/uL — ABNORMAL LOW (ref 3.87–5.11)
RDW: 14.5 % (ref 11.5–15.5)
WBC: 3.9 10*3/uL — ABNORMAL LOW (ref 4.0–10.5)

## 2017-08-26 LAB — LIPID PANEL
Cholesterol: 138 mg/dL (ref 0–200)
HDL: 59.5 mg/dL (ref >39.00)
LDL Cholesterol: 68 mg/dL (ref 0–99)
NonHDL: 78.2
Total CHOL/HDL Ratio: 2
Triglycerides: 50 mg/dL (ref 0.0–149.0)
VLDL: 10 mg/dL (ref 0.0–40.0)

## 2017-08-26 LAB — IBC PANEL
Iron: 59 ug/dL (ref 42–145)
Saturation Ratios: 17.9 % — ABNORMAL LOW (ref 20.0–50.0)
Transferrin: 235 mg/dL (ref 212.0–360.0)

## 2017-08-26 LAB — FERRITIN: Ferritin: 17.5 ng/mL (ref 10.0–291.0)

## 2017-08-26 LAB — HEMOGLOBIN A1C: Hgb A1c MFr Bld: 5.3 % (ref 4.6–6.5)

## 2017-08-26 LAB — VITAMIN D 25 HYDROXY (VIT D DEFICIENCY, FRACTURES): VITD: 55.84 ng/mL (ref 30.00–100.00)

## 2017-08-26 NOTE — Patient Instructions (Addendum)
Your bone density has not worsened since you started Prolia.  Keep doing what you are doing   Pre treat yourself with tylenol prior to your shingles vaccine
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
## 2017-08-26 NOTE — Telephone Encounter (Signed)
Pt stated that its time for her prolia injection and would like to get the process started. Pt contact [phone]

## 2017-08-27 DIAGNOSIS — Z9109 Other allergy status, other than to drugs and biological substances: Secondary | ICD-10-CM | POA: Insufficient documentation

## 2017-08-27 NOTE — Assessment & Plan Note (Signed)
Unclear if symptoms or right foot numbness  were due to TIA pr transient disk herniation from recurrent lifting.   No change to regimen

## 2017-08-27 NOTE — Assessment & Plan Note (Signed)
Recent ECHO March 2018  EF 40 to 45% .  Her weight is stable and she appears well compensated

## 2017-08-27 NOTE — Assessment & Plan Note (Signed)
5 mm right costophrenic angle, stable since 2017 and considered benign

## 2017-08-27 NOTE — Telephone Encounter (Signed)
error 

## 2017-08-27 NOTE — Assessment & Plan Note (Signed)
seondary to new hone . Managed with QOD claritin anddaily Singulair

## 2017-08-27 NOTE — Assessment & Plan Note (Signed)
Managed with Prolia.  T scores are unchanged.   

## 2017-08-28 ENCOUNTER — Encounter: Payer: Self-pay | Admitting: Cardiology

## 2017-08-28 ENCOUNTER — Encounter: Payer: Self-pay | Admitting: Internal Medicine

## 2017-08-28 DIAGNOSIS — D509 Iron deficiency anemia, unspecified: Secondary | ICD-10-CM

## 2017-08-28 NOTE — Assessment & Plan Note (Signed)
Iron deficient,  Chronic, etiology unclear.  colonsocopy in 2015 noted diverticulosis, no polyps.  She is anticoagulated on Eliquis.  Home fecal occult cards needed ,  Additional iron recommended.

## 2017-08-28 NOTE — Addendum Note (Signed)
Addended by: [name] on: 08/28/2017 12:24 PM   Modules accepted: Orders

## 2017-09-01 ENCOUNTER — Other Ambulatory Visit: Payer: Medicare Other

## 2017-09-02 ENCOUNTER — Other Ambulatory Visit (INDEPENDENT_AMBULATORY_CARE_PROVIDER_SITE_OTHER): Payer: Medicare Other

## 2017-09-02 ENCOUNTER — Encounter: Payer: Self-pay | Admitting: Internal Medicine

## 2017-09-02 DIAGNOSIS — D509 Iron deficiency anemia, unspecified: Secondary | ICD-10-CM | POA: Diagnosis not present

## 2017-09-02 LAB — FECAL OCCULT BLOOD, IMMUNOCHEMICAL: Fecal Occult Bld: NEGATIVE

## 2017-09-02 NOTE — Telephone Encounter (Signed)
Sent prolia information to insurance

## 2017-09-03 ENCOUNTER — Other Ambulatory Visit: Payer: Self-pay

## 2017-09-03 DIAGNOSIS — D619 Aplastic anemia, unspecified: Secondary | ICD-10-CM

## 2017-09-05 ENCOUNTER — Ambulatory Visit (INDEPENDENT_AMBULATORY_CARE_PROVIDER_SITE_OTHER): Payer: Medicare Other | Admitting: *Deleted

## 2017-09-05 DIAGNOSIS — Z23 Encounter for immunization: Secondary | ICD-10-CM | POA: Diagnosis not present

## 2017-09-12 ENCOUNTER — Telehealth: Payer: Self-pay | Admitting: Internal Medicine

## 2017-09-12 NOTE — Telephone Encounter (Signed)
Patient called wanting to know if her Prolia has been ordered.

## 2017-09-12 NOTE — Progress Notes (Signed)
Has been completed

## 2017-09-18 NOTE — Telephone Encounter (Signed)
Scheduled, for next week, thanks

## 2017-09-22 ENCOUNTER — Ambulatory Visit: Payer: Medicare Other | Admitting: Pulmonary Disease

## 2017-09-22 ENCOUNTER — Other Ambulatory Visit: Payer: Self-pay | Admitting: Internal Medicine

## 2017-09-25 ENCOUNTER — Ambulatory Visit (INDEPENDENT_AMBULATORY_CARE_PROVIDER_SITE_OTHER): Payer: Medicare Other | Admitting: *Deleted

## 2017-09-25 DIAGNOSIS — M810 Age-related osteoporosis without current pathological fracture: Secondary | ICD-10-CM | POA: Diagnosis not present

## 2017-09-25 MED ORDER — DENOSUMAB 60 MG/ML ~~LOC~~ SOLN
60.0000 mg | Freq: Once | SUBCUTANEOUS | Status: AC
  Administered 2017-09-25: 60 mg via SUBCUTANEOUS

## 2017-09-25 NOTE — Progress Notes (Signed)
Patient presented for Prolia injection to right arm , given subcutaneous, patient tolerated well.

## 2017-09-28 NOTE — Progress Notes (Signed)
Subjective:    Patient ID: [name] [name], female    DOB: 1941-01-15, 76 y.o.   MRN: [id number]  C.C.:  Follow-up for Right Lung Opacity/Nodule, Pulmonary Hypertension, & Mild Restrictive Lung Disease.  HPI  She reports she moved to a new residence and was found to have mold in her ductwork. She had her UV light for sterilization replaced. Previously she was having nocturnal awakenings with dyspnea. She also noticed headaches.   Right lung opacity/nodule: Right lower lobe nodule found incidentally on CT imaging in September 2016.Has not appreciably changed in size compared with CT imaging from early September. She reports her CT was done due the time of her mold exposure.   Pulmonary hypertension: Likely secondary to left ventricular hypertrophy resulting in diastolic dysfunction. Patient also has a history of mitral valve dysfunction and is status post replacement. Following with cardiology. Currently on Lasix taking intermittently. No pulmonary arterial vasodilator therapy.   Mild restrictive lung disease: Likely secondary to left ventricular hypertrophy. No evidence of interstitial lung disease on chest CT imaging.  Review of Systems She denies any fever, chills, or sweats. She has gone back onto Singulair. No change in her baseline sinus congestion or drainage that is mostly in the morning. She has been using her nasal spray more frequently. No rashes or bruising.  Allergies  Allergen Reactions  . Meloxicam Other (See Comments)    Severe reflux  . Codeine Nausea And Vomiting    migraine  . Doxycycline Itching and Swelling    Facial  . Hydrocodone Nausea And Vomiting    MIGRAINE  . Hydromorphone Nausea And Vomiting    migraine  . Oxycodone Nausea And Vomiting    Severe migraine   Current Outpatient Prescriptions on File Prior to Visit  Medication Sig Dispense Refill  . albuterol (PROVENTIL HFA;VENTOLIN HFA) 108 (90 BASE) MCG/ACT inhaler Inhale 2 puffs into the lungs every 6  (six) hours as needed for wheezing or shortness of breath. 1 Inhaler 2  . ALPRAZolam (XANAX) 0.25 MG tablet Take 1 tablet (0.25 mg total) by mouth at bedtime as needed for anxiety or sleep. 30 tablet 5  . amoxicillin (AMOXIL) 500 MG capsule TAKE 4 CAPSULES BY MOUTH 60 MINUTES PRIOR TO DENTAL WORK 30 capsule 0  . azelastine (ASTELIN) 0.1 % nasal spray Place 1 spray into both nostrils as needed for allergies.   5  . b complex vitamins tablet Take 1 tablet by mouth daily.    . butalbital-acetaminophen-caffeine (FIORICET, ESGIC) 50-325-40 MG tablet TAKE 1 TABLET BY MOUTH AS NEEDED FOR BACK PAIN OR HEADACHE 90 tablet 2  . Cholecalciferol (VITAMIN D) 2000 units CAPS Take 2,000 Units by mouth daily.    . Coenzyme Q10 (CO Q 10) 100 MG CAPS Take 200 mg by mouth 2 (two) times daily.     Marland Kitchen ELIQUIS 5 MG TABS tablet TAKE 1 TABLET BY MOUTH TWICE DAILY 180 tablet 3  . FeFum-FePo-FA-B Cmp-C-Zn-Mn-Cu (SE-TAN PLUS) 162-115.2-1 MG CAPS TAKE 1 CAPSULE EVERY DAY WITH BREAKFAST 30 capsule 3  . furosemide (LASIX) 40 MG tablet Take 40 mg by mouth as directed.    . loratadine (CLARITIN) 10 MG tablet Take 10 mg by mouth daily as needed for allergies.    . metoprolol tartrate (LOPRESSOR) 25 MG tablet TAKE ONE-HALF TABLET BY MOUTH TWICE DAILY 90 tablet 2  . montelukast (SINGULAIR) 10 MG tablet TK 1 T PO QD  2  . rosuvastatin (CRESTOR) 40 MG tablet TAKE ONE TABLET BY MOUTH  EVERY DAY 90 tablet 1   No current facility-administered medications on file prior to visit.    Past Medical History:  Diagnosis Date  . Acute on chronic diastolic (congestive) heart failure ([place])   . Allergy    See list  . Anemia 2018  . Anxiety    Occasionally take Xanax for sleep  . Anxiety associated with depression    Prn alprazolam   . Arthritis    Hands, Back  . Atrial fibrillation, persistent ([place])    DCCV 08/22/2015  . Bradycardia post-op bradycardia, pacer dependent   MDT PPM 11/06/15, Dr. Lovena Le  . Cancer ([place]) 1991   Basal cell on  nose  . Cataract    Left eye  . Diverticulosis   . Heart murmur   . Hemorrhoid   . Hepatic cyst    innumerable  . 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 Hyperlipidemia   . IBS (irritable bowel syndrome)   . Idiopathic thrombocytopenic purpura (ITP)   . Migraine   . MVP (mitral valve prolapse)   . Nodule of right lung   . Osteoporosis of forearm   . RBBB   . Restrictive lung disease    Mild on PFT & likely cardiac in etiology   . S/P Minimally invasive maze operation for atrial fibrillation 10/31/2015   Complete bilateral atrial lesion set using cryothermy and bipolar radiofrequency ablation with clipping of LA appendage via right mini thoracotomy approach  . S/P minimally invasive mitral valve replacement with bioprosthetic valve 10/31/2015   33 mm [facility] Mitral bovine bioprosthetic tissue valve placed via right mini thoracotomy approach  . Severe mitral regurgitation   . SVT (supraventricular tachycardia) ([place])   . Thoracic aorta atherosclerosis ([place])   . TIA (transient ischemic attack)    Past Surgical History:  Procedure Laterality Date  . BREAST BIOPSY Right Late 80s   Negative  . CARDIAC CATHETERIZATION N/A 10/18/2015   Procedure: Right/Left Heart Cath and Coronary Angiography;  Surgeon: Sherren Mocha, MD;  Location: [place] [facility];  Service: Cardiovascular;  Laterality: N/A;  . CARDIOVERSION N/A 08/22/2015   Procedure: CARDIOVERSION;  Surgeon: Thayer Headings, MD;  Location: [place];  Service: Cardiovascular;  Laterality: N/A;  . COLONOSCOPY  2003  . EP IMPLANTABLE DEVICE N/A 11/06/2015   Procedure: Pacemaker Implant;  Surgeon: Evans Lance, MD;  Location: [place] [facility];  Service: Cardiovascular;  Laterality: N/A;  . EP IMPLANTABLE DEVICE N/A 02/20/2016   Procedure: Lead Extraction;  Surgeon: Evans Lance, MD;  Location: [place] [facility];  Service: Cardiovascular;  Laterality: N/A;  . EYE SURGERY Right 2013  . FOOT SURGERY    . MANDIBLE FRACTURE  SURGERY  03-26-13  . MINIMALLY INVASIVE MAZE PROCEDURE N/A 10/31/2015   Procedure: MINIMALLY INVASIVE MAZE PROCEDURE;  Surgeon: Rexene Alberts, MD;  Location: [place];  Service: Open Heart Surgery;  Laterality: N/A;  . MITRAL VALVE REPLACEMENT Right 10/31/2015   Procedure: MINIMALLY INVASIVE MITRAL VALVE (MV) REPLACEMENT;  Surgeon: Rexene Alberts, MD;  Location: [place];  Service: Open Heart Surgery;  Laterality: Right;  . PACEMAKER LEAD REMOVAL  02/20/2016  . TEE WITH CARDIOVERSION    . TEE WITHOUT CARDIOVERSION N/A 08/22/2015   Procedure: TRANSESOPHAGEAL ECHOCARDIOGRAM (TEE);  Surgeon: Thayer Headings, MD;  Location: [place];  Service: Cardiovascular;  Laterality: N/A;  . TEE WITHOUT CARDIOVERSION N/A 10/31/2015   Procedure: TRANSESOPHAGEAL ECHOCARDIOGRAM (TEE);  Surgeon: Rexene Alberts, MD;  Location: [place];  Service: Open Heart Surgery;  Laterality: N/A;  . TUBAL LIGATION    . VARICOSE VEIN SURGERY Right    Family History  Problem Relation Age of Onset  . Hypertension Mother   . Arrhythmia Mother   . Heart failure Mother   . Arrhythmia Brother   . Stroke Brother 28       cerebral hemorrhage, nonsmoker, no HTN  . Prostate cancer Brother   . Stroke Father        from an aneurysm  . Stroke Maternal Aunt 83       cerebral hemorrhage  . Liver cancer Maternal Grandmother   . Atrial fibrillation Son   . Celiac disease Son   . Heart attack Neg Hx   . Breast cancer Neg Hx    Social History   Social History  . Marital status: Widowed    Spouse name: Deceased  . Number of children: 2  . Years of education: 16   Occupational History  . Retired    Social History Main Topics  . Smoking status: Never Smoker  . Smokeless tobacco: Never Used  . Alcohol use No  . Drug use: No  . Sexual activity: No   Other Topics Concern  . None   Social History Narrative   She is a widow.  Husband died from metastatic renal cell cancer. Originally from [place]. Previously lived in [place]  from 1975-[place]. Moved to [place] in 2007. No mold exposure recently but did have it through a prior work exposure in 1993. Has a masters in public health. No bird exposure.      [facility] (09/29/17):   She has moved into a retirement community since last appointment. She reports she had testing at her new residence that was positive for mold. It has since been treated.       Objective:   Physical Exam BP 100/60 (BP Location: Left Arm, Cuff Size: Normal)   Pulse 86   Ht 5' 5.5" (1.664 m)   Wt 134 lb 2 oz (60.8 kg)   SpO2 99%   BMI 21.98 kg/m   General:  Awake. Alert. No distress.  Integument:  Warm. Dry. No rash. Extremities:  No cyanosis or clubbing.  HEENT:  Minimal nasal turbinate swelling. No oral ulcers. Moist mucous membranes. Cardiovascular:  Regular rate. No edema. Unable to appreciate JVD.  Pulmonary:  Clear bilaterally with auscultation. Normal work of breathing on room air. Abdomen: Soft. Normal bowel sounds. Nondistended.  Musculoskeletal:  Normal bulk and tone. No joint effusion appreciated. Neurological:  Cranial nerves 2-12 grossly in tact. No meningismus. Moving all 4 extremities equally.   PFT 09/01/15: FVC 2.58 L (84%) FEV1 2.02 L (87%) FEV1/FVC 0.78 FEF 25-75 1.76 L (98%) no bronchodilator response TLC 3.82 L (71%) RV 54% ERV 195% DLCO uncorrected 83%   IMAGING CT CHEST W/O 08/09/17 (personally reviewed by me):  Right lower lobe nodule measures approximately 5-6 millimeters by my review. No evidence of evolving nodule or mass. No new nodule or mass. No pathologic mediastinal adenopathy. No pleural effusion or thickening. No pericardial effusion. Polycystic liver is noted as well as left renal cystic density. Linear opacity in right middle lobe consistent with scar formation.  CT CHEST W/O 08/20/16 (previously reviewed by me):  6 mm right lower lobe nodule unchanged. No new nodule or opacity appreciated. No pathologic mediastinal adenopathy. No pleural effusion or  thickening. No pericardial effusion. 3 mm calcified nodule subpleural and right upper lobe unchanged.  CT CHEST  W/O 02/05/16 (previously reviewed by me): 6 mm nodule in right lower lobe without sign of enlargement. No new nodule or opacity. No pleural effusion or thickening. No pathologic mediastinal adenopathy. No pericardial effusion.   CTA CHEST 10/27/15 (previously reviewed by me): Suggestion of hepatic cysts on CT imaging. 7 mm nodule within the sulcus of right lower lobe lateral subsegment. No developing nodule or opacity appreciated by me. No pleural effusion. No pathologic mediastinal adenopathy. No pulmonary embolus. Previously noted scarring persists. Calcified granuloma noted within right upper lobe still present.  CT CHEST W/O 08/15/15 (previously reviewed by me): Multiple cystic lesions within the liver. 6 mm nodule inferior lateral segment right lower lobe. Previously noted central opacification within right upper lobe has resolved. Patient's bilateral pleural effusions have largely resolved with a very tiny amount of pleural effusion still present on the right. There appears to be some slight decrease in the consolidation within the medial segment of the right middle lobe point compared with prior imaging. It's difficult to discern whether or not a lung nodule was present on previous imaging given the compression from prior pleural effusion.   CT CHEST W/ 07/31/15 (previously reviewed by me):  Bilateral pleural effusions right greater than left. No pathologic mediastinal adenopathy. Patchy opacity in the right middle lobe. With central opacification present within right upper lobe.  CARDIAC TTE (03/04/16): LV EF 35-40%. No regional wall motion abnormalities. Profound apex to base and septum to lateral wall dyssynchrony. Unable to assess diastolic function. LA moderately dilated. RV moderately dilated with mildly reduced systolic function. No aortic stenosis or regurgitation. Aortic root normal in  size. Bioprosthetic mitral valve functioning appropriately with no significant perivalvular regurgitation.  TTE (07/31/15): Difficult study. LV normal in size with mild concentric LVH. EF 60%. Unable to assess diastolic function due to atrial fibrillation. Normal regional function. LA & RA severely enlarged. RV normal in size and function. Pulmonary artery systolic pressure 18-84 mmHg. IVC dilated. Aortic valve not well seen. Trace aortic regurgitation. Moderate mitral valve stenosis with mild regurgitation. Mild tricuspid regurgitation. No pulmonic stenosis or regurgitation. No pericardial effusion.  LABS 08/15/15 CBC: 6.3/12.7/38.4/203 BMP: 136/4.6/102/27/24/0.83/100/9.3 INR: 1.4    Assessment & Plan:  76 y.o. female with a right lower lobe nodule that has been unchanged for 2 years duration. I reviewed her CT scan with her today. There is no need for further imaging at this time. Also, there is no evidence of any parenchymal disease that would account for her symptoms experienced with her prior mold exposure. I suspect this was more of an airway response and possible allergic asthma. With regards to her pulmonary hypertension she is euvolemic and does not seem to require any pulmonary arterial vasodilator therapy. I instructed her to contact our office if she had any new breathing problems as we would be happy to see her back if needed.  1. Right lower lobe nodule:  Stable for 2 years duration. No need for further imaging. 2. Pulmonary hypertension:  Followed by cardiology. No indication for pulmonary arterial vasodilator therapy. 3. Health maintenance: Status post Flu Vaccine October 2018, Tdap July 2013, Prevnar February 2015, & Pneumovax 23 June 2012 4. Follow-up: Return to clinic as needed.  Sonia Baller Ashok Cordia, M.D. [facility] Pager:  [phone] After 3pm or if no response, call [phone] 9:27 AM 09/29/17

## 2017-09-29 ENCOUNTER — Ambulatory Visit (INDEPENDENT_AMBULATORY_CARE_PROVIDER_SITE_OTHER): Payer: Medicare Other | Admitting: Pulmonary Disease

## 2017-09-29 ENCOUNTER — Encounter: Payer: Self-pay | Admitting: Pulmonary Disease

## 2017-09-29 VITALS — BP 100/60 | HR 86 | Ht 65.5 in | Wt 134.1 lb

## 2017-09-29 DIAGNOSIS — I2729 Other secondary pulmonary hypertension: Secondary | ICD-10-CM

## 2017-09-29 DIAGNOSIS — IMO0001 Reserved for inherently not codable concepts without codable children: Secondary | ICD-10-CM

## 2017-09-29 DIAGNOSIS — R911 Solitary pulmonary nodule: Secondary | ICD-10-CM

## 2017-09-29 NOTE — Patient Instructions (Signed)
   Please contact our office if you develop any new breathing problems.  We will be happy to see you back as needed.

## 2017-10-04 ENCOUNTER — Encounter: Payer: Self-pay | Admitting: Emergency Medicine

## 2017-10-04 ENCOUNTER — Emergency Department
Admission: EM | Admit: 2017-10-04 | Discharge: 2017-10-04 | Disposition: A | Discharge status: Home / Self Care | Payer: Medicare Other | Attending: Student in an Organized Health Care Education/Training Program | Admitting: Student in an Organized Health Care Education/Training Program

## 2017-10-04 ENCOUNTER — Emergency Department: Payer: Medicare Other

## 2017-10-04 DIAGNOSIS — I5032 Chronic diastolic (congestive) heart failure: Secondary | ICD-10-CM | POA: Diagnosis not present

## 2017-10-04 DIAGNOSIS — Z79899 Other long term (current) drug therapy: Secondary | ICD-10-CM | POA: Diagnosis not present

## 2017-10-04 DIAGNOSIS — G459 Transient cerebral ischemic attack, unspecified: Secondary | ICD-10-CM | POA: Diagnosis not present

## 2017-10-04 DIAGNOSIS — I481 Persistent atrial fibrillation: Secondary | ICD-10-CM | POA: Diagnosis not present

## 2017-10-04 DIAGNOSIS — E785 Hyperlipidemia, unspecified: Secondary | ICD-10-CM | POA: Insufficient documentation

## 2017-10-04 DIAGNOSIS — R531 Weakness: Secondary | ICD-10-CM | POA: Diagnosis present

## 2017-10-04 LAB — BASIC METABOLIC PANEL
Anion gap: 8 (ref 5–15)
BUN: 27 mg/dL — ABNORMAL HIGH (ref 6–20)
CO2: 28 mmol/L (ref 22–32)
Calcium: 9.3 mg/dL (ref 8.9–10.3)
Chloride: 102 mmol/L (ref 101–111)
Creatinine, Ser: 0.84 mg/dL (ref 0.44–1.00)
GFR calc Af Amer: >60 mL/min (ref >60)
GFR calc non Af Amer: >60 mL/min (ref >60)
Glucose, Bld: 102 mg/dL — ABNORMAL HIGH (ref 65–99)
Potassium: 3.6 mmol/L (ref 3.5–5.1)
Sodium: 138 mmol/L (ref 135–145)

## 2017-10-04 LAB — URINALYSIS, COMPLETE (UACMP) WITH MICROSCOPIC
Bacteria, UA: NONE SEEN
Bilirubin Urine: NEGATIVE
Glucose, UA: NEGATIVE mg/dL
Hgb urine dipstick: NEGATIVE
Ketones, ur: NEGATIVE mg/dL
Leukocytes, UA: NEGATIVE
Nitrite: NEGATIVE
Protein, ur: NEGATIVE mg/dL
Specific Gravity, Urine: 1.005 (ref 1.005–1.030)
WBC, UA: NONE SEEN WBC/hpf (ref 0–5)
pH: 7 (ref 5.0–8.0)

## 2017-10-04 LAB — CBC
HCT: 35.2 % (ref 35.0–47.0)
Hemoglobin: 11.8 g/dL — ABNORMAL LOW (ref 12.0–16.0)
MCH: 31.2 pg (ref 26.0–34.0)
MCHC: 33.5 g/dL (ref 32.0–36.0)
MCV: 93.1 fL (ref 80.0–100.0)
Platelets: 111 10*3/uL — ABNORMAL LOW (ref 150–440)
RBC: 3.78 MIL/uL — ABNORMAL LOW (ref 3.80–5.20)
RDW: 13.6 % (ref 11.5–14.5)
WBC: 4.7 10*3/uL (ref 3.6–11.0)

## 2017-10-04 LAB — TROPONIN I: Troponin I: <0.03 ng/mL (ref <0.03)

## 2017-10-04 NOTE — ED Notes (Signed)
Patient does not appear to be in any acute distress at time of discharge. Patient ambulatory to lobby with steady gate. Patient denies any comments or concerns regarding discharge.  

## 2017-10-04 NOTE — ED Provider Notes (Signed)
[facility] Emergency Department Provider Note    First MD Initiated Contact with Patient 10/04/17 1031     (approximate)  I have reviewed the triage vital signs and the nursing notes.   HISTORY  Chief Complaint Weakness    HPI [name] [name] is a 76 y.o. female history of heart failure anxiety, TIAs on Eliquis as well as pacer dependent presents with chief complaint of brief episode of left-sided hemiparesis lasting roughly 3-7minutes after she was eating lunch.  Patient states that she could not move her left side and so she lowered herself to the ground and started rolling to get to her phone when she got there she realized that she had all of her strength back got up and went about her regular day.  Has not had any weakness, numbness or tingling, chest pain, shortness of breath, nausea vomiting or blurry vision.  Past Medical History:  Diagnosis Date  . Acute on chronic diastolic (congestive) heart failure ([place])   . Allergy    See list  . Anemia 2018  . Anxiety    Occasionally take Xanax for sleep  . Anxiety associated with depression    Prn alprazolam   . Arthritis    Hands, Back  . Atrial fibrillation, persistent ([place])    DCCV 08/22/2015  . Bradycardia post-op bradycardia, pacer dependent   MDT PPM 11/06/15, Dr. Lovena Le  . Cancer ([place]) 1991   Basal cell on nose  . Cataract    Left eye  . Diverticulosis   . Heart murmur   . Hemorrhoid   . Hepatic cyst    innumerable  . 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 Hyperlipidemia   . IBS (irritable bowel syndrome)   . Idiopathic thrombocytopenic purpura (ITP)   . Migraine   . MVP (mitral valve prolapse)   . Nodule of right lung   . Osteoporosis of forearm   . RBBB   . Restrictive lung disease    Mild on PFT & likely cardiac in etiology   . S/P Minimally invasive maze operation for atrial fibrillation 10/31/2015   Complete bilateral atrial lesion set using cryothermy and bipolar radiofrequency  ablation with clipping of LA appendage via right mini thoracotomy approach  . S/P minimally invasive mitral valve replacement with bioprosthetic valve 10/31/2015   33 mm [facility] Mitral bovine bioprosthetic tissue valve placed via right mini thoracotomy approach  . Severe mitral regurgitation   . SVT (supraventricular tachycardia) ([place])   . Thoracic aorta atherosclerosis ([place])   . TIA (transient ischemic attack)    Family History  Problem Relation Age of Onset  . Hypertension Mother   . Arrhythmia Mother   . Heart failure Mother   . Arrhythmia Brother   . Stroke Brother 72       cerebral hemorrhage, nonsmoker, no HTN  . Prostate cancer Brother   . Stroke Father        from an aneurysm  . Stroke Maternal Aunt 83       cerebral hemorrhage  . Liver cancer Maternal Grandmother   . Atrial fibrillation Son   . Celiac disease Son   . Heart attack Neg Hx   . Breast cancer Neg Hx    Past Surgical History:  Procedure Laterality Date  . BREAST BIOPSY Right Late 80s   Negative  . CARDIAC CATHETERIZATION N/A 10/18/2015   Procedure: Right/Left Heart Cath and Coronary Angiography;  Surgeon: Sherren Mocha, MD;  Location: [place] CV  LAB;  Service: Cardiovascular;  Laterality: N/A;  . CARDIOVERSION N/A 08/22/2015   Procedure: CARDIOVERSION;  Surgeon: Thayer Headings, MD;  Location: [place];  Service: Cardiovascular;  Laterality: N/A;  . COLONOSCOPY  2003  . EP IMPLANTABLE DEVICE N/A 11/06/2015   Procedure: Pacemaker Implant;  Surgeon: Evans Lance, MD;  Location: [place] CV LAB;  Service: Cardiovascular;  Laterality: N/A;  . EP IMPLANTABLE DEVICE N/A 02/20/2016   Procedure: Lead Extraction;  Surgeon: Evans Lance, MD;  Location: [place] CV LAB;  Service: Cardiovascular;  Laterality: N/A;  . EYE SURGERY Right 2013  . FOOT SURGERY    . MANDIBLE FRACTURE SURGERY  03-26-13  . MINIMALLY INVASIVE MAZE PROCEDURE N/A 10/31/2015   Procedure: MINIMALLY INVASIVE MAZE PROCEDURE;   Surgeon: Rexene Alberts, MD;  Location: [place];  Service: Open Heart Surgery;  Laterality: N/A;  . MITRAL VALVE REPLACEMENT Right 10/31/2015   Procedure: MINIMALLY INVASIVE MITRAL VALVE (MV) REPLACEMENT;  Surgeon: Rexene Alberts, MD;  Location: [place];  Service: Open Heart Surgery;  Laterality: Right;  . PACEMAKER LEAD REMOVAL  02/20/2016  . TEE WITH CARDIOVERSION    . TEE WITHOUT CARDIOVERSION N/A 08/22/2015   Procedure: TRANSESOPHAGEAL ECHOCARDIOGRAM (TEE);  Surgeon: Thayer Headings, MD;  Location: [place];  Service: Cardiovascular;  Laterality: N/A;  . TEE WITHOUT CARDIOVERSION N/A 10/31/2015   Procedure: TRANSESOPHAGEAL ECHOCARDIOGRAM (TEE);  Surgeon: Rexene Alberts, MD;  Location: [place];  Service: Open Heart Surgery;  Laterality: N/A;  . TUBAL LIGATION    . VARICOSE VEIN SURGERY Right    Patient Active Problem List   Diagnosis Date Noted  . Allergy to environmental factors 08/27/2017  . Dermatitis due to plants, including poison ivy, sumac, and oak 05/29/2017  . Anemia 02/17/2017  . DOE (dyspnea on exertion) 01/28/2017  . Visit for preventive health examination 05/23/2016  . Hepatic cyst 05/04/2016  . TIA (transient ischemic attack) 05/03/2016  . Chronic systolic congestive heart failure ([place]) 05/03/2016  . Peliosis hepatis 02/15/2016  . Prediabetes 02/15/2016  . Pacemaker 11/06/2015  . H/O mitral valve replacement 10/31/2015  . S/P Minimally invasive maze operation for atrial fibrillation 10/31/2015  . Atrial fibrillation, persistent ([place])   . Restrictive lung disease 09/12/2015  . Pulmonary hypertension ([place]) 08/08/2015  . Nodule of right lung 08/04/2015  . Thoracic aorta atherosclerosis ([place]) 02/05/2015  . Family history of cerebral aneurysm 08/02/2014  . Diverticulosis of colon without hemorrhage 01/09/2014  . Thrombocytopenia ([place]) 02/04/2013  . IBS (irritable bowel syndrome) 12/31/2012  . History of asbestos exposure 06/27/2012  . Osteoporosis of forearm  06/27/2012  . Anxiety associated with depression 06/25/2012  . Cataract extraction status of right eye 06/25/2012  . RBBB 05/05/2012  . Hyperlipidemia 08/06/2011  . Mitral valve prolapse 05/02/2011      Prior to Admission medications   Medication Sig Start Date End Date Taking? Authorizing Provider  albuterol (PROVENTIL HFA;VENTOLIN HFA) 108 (90 BASE) MCG/ACT inhaler Inhale 2 puffs into the lungs every 6 (six) hours as needed for wheezing or shortness of breath. 08/15/15   Nahser, Wonda Cheng, MD  ALPRAZolam Duanne Moron) 0.25 MG tablet Take 1 tablet (0.25 mg total) by mouth at bedtime as needed for anxiety or sleep. 08/18/15   [name], MD  amoxicillin (AMOXIL) 500 MG capsule TAKE 4 CAPSULES BY MOUTH 60 MINUTES PRIOR TO DENTAL WORK 12/04/16   Eileen Stanford, PA-C  azelastine (ASTELIN) 0.1 % nasal spray Place 1 spray into both nostrils as  needed for allergies.  09/05/15   [provider]  b complex vitamins tablet Take 1 tablet by mouth daily.    [provider]  butalbital-acetaminophen-caffeine (FIORICET, ESGIC) [phone] MG tablet TAKE 1 TABLET BY MOUTH AS NEEDED FOR BACK PAIN OR HEADACHE 03/12/17   [name], MD  Cholecalciferol (VITAMIN D) 2000 units CAPS Take 2,000 Units by mouth daily.    [provider]  Coenzyme Q10 (CO Q 10) 100 MG CAPS Take 200 mg by mouth 2 (two) times daily.     [provider]  ELIQUIS 5 MG TABS tablet TAKE 1 TABLET BY MOUTH TWICE DAILY 05/07/17   Nahser, Wonda Cheng, MD  FeFum-FePo-FA-B Cmp-C-Zn-Mn-Cu (SE-TAN PLUS) 162-115.2-1 MG CAPS TAKE 1 CAPSULE EVERY DAY WITH BREAKFAST 08/12/17   [name], MD  furosemide (LASIX) 40 MG tablet Take 40 mg by mouth as directed.    [provider]  loratadine (CLARITIN) 10 MG tablet Take 10 mg by mouth daily as needed for allergies.    [provider]  metoprolol tartrate (LOPRESSOR) 25 MG tablet TAKE ONE-HALF TABLET BY MOUTH TWICE DAILY 07/14/17   Nahser, Wonda Cheng, MD    montelukast (SINGULAIR) 10 MG tablet TK 1 T PO QD 07/25/17   [provider]  rosuvastatin (CRESTOR) 40 MG tablet TAKE ONE TABLET BY MOUTH EVERY DAY 09/22/17   [name], MD    Allergies Meloxicam; Codeine; Doxycycline; Hydrocodone; Hydromorphone; and Oxycodone    Social History Social History  Substance Use Topics  . Smoking status: Never Smoker  . Smokeless tobacco: Never Used  . Alcohol use No    Review of Systems Patient denies headaches, rhinorrhea, blurry vision, numbness, shortness of breath, chest pain, edema, cough, abdominal pain, nausea, vomiting, diarrhea, dysuria, fevers, rashes or hallucinations unless otherwise stated above in HPI. ____________________________________________   PHYSICAL EXAM:  VITAL SIGNS: Vitals:   10/04/17 1003 10/04/17 1008  BP: 118/80 118/80  Pulse:  72  Resp: 14 16  Temp:  97.6 F (36.4 C)  SpO2:  100%    Constitutional: Alert and oriented. Well appearing and in no acute distress. Eyes: Conjunctivae are normal.  Head: Atraumatic. Nose: No congestion/rhinnorhea. Mouth/Throat: Mucous membranes are moist.   Neck: No stridor. Painless ROM.  No bruits Cardiovascular: Normal rate, regular rhythm. Grossly normal heart sounds.  Good peripheral circulation. Respiratory: Normal respiratory effort.  No retractions. Lungs CTAB. Gastrointestinal: Soft and nontender. No distention. No abdominal bruits. No CVA tenderness. Musculoskeletal: No lower extremity tenderness nor edema.  No joint effusions. Neurologic:  CN- intact.  No facial droop, Normal FNF.  Normal heel to shin.  Sensation intact bilaterally. Normal speech and language. No gross focal neurologic deficits are appreciated. No gait instability. Skin:  Skin is warm, dry and intact. No rash noted. Psychiatric: Mood and affect are normal. Speech and behavior are normal.  ____________________________________________   LABS (all labs ordered are listed, but only abnormal  results are displayed)  Results for orders placed or performed during the hospital encounter of 10/04/17 (from the past 24 hour(s))  Basic metabolic panel     Status: Abnormal   Collection Time: 10/04/17  7:53 AM  Result Value Ref Range   Sodium 138 135 - 145 mmol/L   Potassium 3.6 3.5 - 5.1 mmol/L   Chloride 102 101 - 111 mmol/L   CO2 28 22 - 32 mmol/L   Glucose, Bld 102 (H) 65 - 99 mg/dL   BUN 27 (H) 6 - 20  mg/dL   Creatinine, Ser 0.84 0.44 - 1.00 mg/dL   Calcium 9.3 8.9 - 10.3 mg/dL   GFR calc non Af Amer >60 >60 mL/min   GFR calc Af Amer >60 >60 mL/min   Anion gap 8 5 - 15  CBC     Status: Abnormal   Collection Time: 10/04/17  7:53 AM  Result Value Ref Range   WBC 4.7 3.6 - 11.0 K/uL   RBC 3.78 (L) 3.80 - 5.20 MIL/uL   Hemoglobin 11.8 (L) 12.0 - 16.0 g/dL   HCT 35.2 35.0 - 47.0 %   MCV 93.1 80.0 - 100.0 fL   MCH 31.2 26.0 - 34.0 pg   MCHC 33.5 32.0 - 36.0 g/dL   RDW 13.6 11.5 - 14.5 %   Platelets 111 (L) 150 - 440 K/uL  Troponin I     Status: None   Collection Time: 10/04/17  7:53 AM  Result Value Ref Range   Troponin I <0.03 <0.03 ng/mL   ____________________________________________  EKG My review and personal interpretation at Time: 7:53   Indication: weakness  Rate: 75  Rhythm: av paced Axis: left Other: normal intervals, no stemi ____________________________________________  RADIOLOGY  I personally reviewed all radiographic images ordered to evaluate for the above acute complaints and reviewed radiology reports and findings.  These findings were personally discussed with the patient.  Please see medical record for radiology report.  ____________________________________________   PROCEDURES  Procedure(s) performed:  Procedures    Critical Care performed: no ____________________________________________   INITIAL IMPRESSION / ASSESSMENT AND PLAN / ED COURSE  Pertinent labs & imaging results that were available during my care of the patient were  reviewed by me and considered in my medical decision making (see chart for details).  DDX: cva, tia, hypoglycemia, dehydration, electrolyte abnormality, dissection, sepsis   PHYLISHA DIX is a 76 y.o. who presents to the ED with chief complaint of left-sided weakness as described above.  She is currently well-appearing and in no acute distress.  CT head is negative.  She is already medically optimized on Eliquis.  Has had similar symptoms in the past of the past several years.  Had CT angiogram of the head and neck done last year that showed no evidence of significant stenosis.  I spoke with Dr. Melrose Nakayama of neurology regarding her presentation and he agrees that she is medically optimized at this point and unlikely to receive any significant benefit from admission to the hospital.  Patient appears well and I do believe that she is stable and appropriate for further workup as an outpatient.  Have discussed with the patient and available family all diagnostics and treatments performed thus far and all questions were answered to the best of my ability. The patient demonstrates understanding and agreement with plan.       ____________________________________________   FINAL CLINICAL IMPRESSION(S) / ED DIAGNOSES  Final diagnoses:  TIA (transient ischemic attack)      NEW MEDICATIONS STARTED DURING THIS VISIT:  New Prescriptions   No medications on file     Note:  This document was prepared using Dragon voice recognition software and may include unintentional dictation errors.    Merlyn Lot, MD 10/04/17 1055

## 2017-10-04 NOTE — ED Triage Notes (Signed)
States yesterday at 1245 while sitting at lunch had L arm and leg paralysis lasting approx 3 minutes. Symptoms resolved. Smile symmetrical, grips equal .

## 2017-10-04 NOTE — ED Notes (Signed)
patient has pacemaker in upper left chest, denies pain, A & O x 4

## 2017-10-07 ENCOUNTER — Encounter: Payer: Self-pay | Admitting: Internal Medicine

## 2017-10-13 ENCOUNTER — Other Ambulatory Visit: Payer: Self-pay | Admitting: Neurology

## 2017-10-13 DIAGNOSIS — Z8673 Personal history of transient ischemic attack (TIA), and cerebral infarction without residual deficits: Secondary | ICD-10-CM

## 2017-10-16 ENCOUNTER — Encounter (HOSPITAL_COMMUNITY): Payer: Self-pay | Admitting: Nurse Practitioner

## 2017-10-16 ENCOUNTER — Ambulatory Visit (HOSPITAL_COMMUNITY)
Admission: RE | Admit: 2017-10-16 | Discharge: 2017-10-16 | Disposition: A | Discharge status: Home / Self Care | Payer: Medicare Other | Source: Ambulatory Visit | Attending: Nurse Practitioner | Admitting: Nurse Practitioner

## 2017-10-16 VITALS — BP 118/84 | HR 74 | Ht 65.0 in | Wt 136.2 lb

## 2017-10-16 DIAGNOSIS — I4891 Unspecified atrial fibrillation: Secondary | ICD-10-CM | POA: Diagnosis not present

## 2017-10-16 DIAGNOSIS — Z48812 Encounter for surgical aftercare following surgery on the circulatory system: Secondary | ICD-10-CM | POA: Insufficient documentation

## 2017-10-16 DIAGNOSIS — Z886 Allergy status to analgesic agent status: Secondary | ICD-10-CM | POA: Diagnosis not present

## 2017-10-16 DIAGNOSIS — M1990 Unspecified osteoarthritis, unspecified site: Secondary | ICD-10-CM | POA: Diagnosis not present

## 2017-10-16 DIAGNOSIS — Z8673 Personal history of transient ischemic attack (TIA), and cerebral infarction without residual deficits: Secondary | ICD-10-CM | POA: Diagnosis not present

## 2017-10-16 DIAGNOSIS — E785 Hyperlipidemia, unspecified: Secondary | ICD-10-CM | POA: Diagnosis not present

## 2017-10-16 DIAGNOSIS — Z885 Allergy status to narcotic agent status: Secondary | ICD-10-CM | POA: Insufficient documentation

## 2017-10-16 DIAGNOSIS — Z8249 Family history of ischemic heart disease and other diseases of the circulatory system: Secondary | ICD-10-CM | POA: Insufficient documentation

## 2017-10-16 DIAGNOSIS — Z953 Presence of xenogenic heart valve: Secondary | ICD-10-CM | POA: Insufficient documentation

## 2017-10-16 DIAGNOSIS — K589 Irritable bowel syndrome without diarrhea: Secondary | ICD-10-CM | POA: Diagnosis not present

## 2017-10-16 DIAGNOSIS — Z8042 Family history of malignant neoplasm of prostate: Secondary | ICD-10-CM | POA: Insufficient documentation

## 2017-10-16 DIAGNOSIS — Z881 Allergy status to other antibiotic agents status: Secondary | ICD-10-CM | POA: Insufficient documentation

## 2017-10-16 DIAGNOSIS — F418 Other specified anxiety disorders: Secondary | ICD-10-CM | POA: Diagnosis not present

## 2017-10-16 DIAGNOSIS — Z79899 Other long term (current) drug therapy: Secondary | ICD-10-CM | POA: Insufficient documentation

## 2017-10-16 DIAGNOSIS — Z823 Family history of stroke: Secondary | ICD-10-CM | POA: Insufficient documentation

## 2017-10-16 DIAGNOSIS — Z85828 Personal history of other malignant neoplasm of skin: Secondary | ICD-10-CM | POA: Insufficient documentation

## 2017-10-16 DIAGNOSIS — I481 Persistent atrial fibrillation: Secondary | ICD-10-CM | POA: Insufficient documentation

## 2017-10-16 DIAGNOSIS — Z7901 Long term (current) use of anticoagulants: Secondary | ICD-10-CM | POA: Insufficient documentation

## 2017-10-16 DIAGNOSIS — Z950 Presence of cardiac pacemaker: Secondary | ICD-10-CM | POA: Diagnosis not present

## 2017-10-16 NOTE — Progress Notes (Signed)
Primary Care Physician: [name], MD Referring Physician: Dr. Roxy Manns Cardiologist: Dr. Acie Fredrickson EP: Dr. Deberah Pelton is a 76 y.o. female with a h/o MV replacement with Maze procedure, 11/ 2016, and subsequent  PPM for bradycardia after surgery, that is in the afib clinic for long term surveillance of maze procedure. She states that she has not had any afib that she is aware since procedure. Last paceart reviewed from September and < than 1% AT/AF.  Today, she denies symptoms of palpitations, chest pain, shortness of breath, orthopnea, PND, lower extremity edema, dizziness, presyncope, syncope, or neurologic sequela. The patient is tolerating medications without difficulties and is otherwise without complaint today.   Past Medical History:  Diagnosis Date  . Acute on chronic diastolic (congestive) heart failure ([place])   . Allergy    See list  . Anemia 2018  . Anxiety    Occasionally take Xanax for sleep  . Anxiety associated with depression    Prn alprazolam   . Arthritis    Hands, Back  . Atrial fibrillation, persistent ([place])    DCCV 08/22/2015  . Bradycardia post-op bradycardia, pacer dependent   MDT PPM 11/06/15, Dr. Lovena Le  . Cancer ([place]) 1991   Basal cell on nose  . Cataract    Left eye  . Diverticulosis   . Heart murmur   . Hemorrhoid   . Hepatic cyst    innumerable  . 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 Hyperlipidemia   . IBS (irritable bowel syndrome)   . Idiopathic thrombocytopenic purpura (ITP)   . Migraine   . MVP (mitral valve prolapse)   . Nodule of right lung   . Osteoporosis of forearm   . RBBB   . Restrictive lung disease    Mild on PFT & likely cardiac in etiology   . S/P Minimally invasive maze operation for atrial fibrillation 10/31/2015   Complete bilateral atrial lesion set using cryothermy and bipolar radiofrequency ablation with clipping of LA appendage via right mini thoracotomy approach  . S/P minimally invasive mitral valve  replacement with bioprosthetic valve 10/31/2015   33 mm [facility] Mitral bovine bioprosthetic tissue valve placed via right mini thoracotomy approach  . Severe mitral regurgitation   . SVT (supraventricular tachycardia) ([place])   . Thoracic aorta atherosclerosis ([place])   . TIA (transient ischemic attack)    Past Surgical History:  Procedure Laterality Date  . BREAST BIOPSY Right Late 80s   Negative  . CARDIAC CATHETERIZATION N/A 10/18/2015   Procedure: Right/Left Heart Cath and Coronary Angiography;  Surgeon: Sherren Mocha, MD;  Location: [place] CV LAB;  Service: Cardiovascular;  Laterality: N/A;  . CARDIOVERSION N/A 08/22/2015   Procedure: CARDIOVERSION;  Surgeon: Thayer Headings, MD;  Location: [place];  Service: Cardiovascular;  Laterality: N/A;  . COLONOSCOPY  2003  . EP IMPLANTABLE DEVICE N/A 11/06/2015   Procedure: Pacemaker Implant;  Surgeon: Evans Lance, MD;  Location: [place] [facility] LAB;  Service: Cardiovascular;  Laterality: N/A;  . EP IMPLANTABLE DEVICE N/A 02/20/2016   Procedure: Lead Extraction;  Surgeon: Evans Lance, MD;  Location: [place] [facility] LAB;  Service: Cardiovascular;  Laterality: N/A;  . EYE SURGERY Right 2013  . FOOT SURGERY    . MANDIBLE FRACTURE SURGERY  03-26-13  . MINIMALLY INVASIVE MAZE PROCEDURE N/A 10/31/2015   Procedure: MINIMALLY INVASIVE MAZE PROCEDURE;  Surgeon: Rexene Alberts, MD;  Location: [place];  Service: Open Heart Surgery;  Laterality:  N/A;  . MITRAL VALVE REPLACEMENT Right 10/31/2015   Procedure: MINIMALLY INVASIVE MITRAL VALVE (MV) REPLACEMENT;  Surgeon: Rexene Alberts, MD;  Location: [place];  Service: Open Heart Surgery;  Laterality: Right;  . PACEMAKER LEAD REMOVAL  02/20/2016  . TEE WITH CARDIOVERSION    . TEE WITHOUT CARDIOVERSION N/A 08/22/2015   Procedure: TRANSESOPHAGEAL ECHOCARDIOGRAM (TEE);  Surgeon: Thayer Headings, MD;  Location: [place];  Service: Cardiovascular;  Laterality: N/A;  . TEE WITHOUT CARDIOVERSION  N/A 10/31/2015   Procedure: TRANSESOPHAGEAL ECHOCARDIOGRAM (TEE);  Surgeon: Rexene Alberts, MD;  Location: [place];  Service: Open Heart Surgery;  Laterality: N/A;  . TUBAL LIGATION    . VARICOSE VEIN SURGERY Right     Current Outpatient Medications  Medication Sig Dispense Refill  . albuterol (PROVENTIL HFA;VENTOLIN HFA) 108 (90 BASE) MCG/ACT inhaler Inhale 2 puffs into the lungs every 6 (six) hours as needed for wheezing or shortness of breath. 1 Inhaler 2  . ALPRAZolam (XANAX) 0.25 MG tablet Take 1 tablet (0.25 mg total) by mouth at bedtime as needed for anxiety or sleep. 30 tablet 5  . amoxicillin (AMOXIL) 500 MG capsule TAKE 4 CAPSULES BY MOUTH 60 MINUTES PRIOR TO DENTAL WORK 30 capsule 0  . azelastine (ASTELIN) 0.1 % nasal spray Place 1 spray into both nostrils as needed for allergies.   5  . b complex vitamins tablet Take 1 tablet by mouth daily.    . butalbital-acetaminophen-caffeine (FIORICET, ESGIC) 50-325-40 MG tablet TAKE 1 TABLET BY MOUTH AS NEEDED FOR BACK PAIN OR HEADACHE 90 tablet 2  . Cholecalciferol (VITAMIN D) 2000 units CAPS Take 2,000 Units by mouth daily.    . Coenzyme Q10 (CO Q 10) 100 MG CAPS Take 200 mg by mouth 2 (two) times daily.     Marland Kitchen ELIQUIS 5 MG TABS tablet TAKE 1 TABLET BY MOUTH TWICE DAILY 180 tablet 3  . FeFum-FePo-FA-B Cmp-C-Zn-Mn-Cu (SE-TAN PLUS) 162-115.2-1 MG CAPS TAKE 1 CAPSULE EVERY DAY WITH BREAKFAST 30 capsule 3  . levETIRAcetam (KEPPRA) 500 MG tablet Take 500 mg 2 (two) times daily by mouth.    . loratadine (CLARITIN) 10 MG tablet Take 10 mg by mouth daily as needed for allergies.    . metoprolol tartrate (LOPRESSOR) 25 MG tablet TAKE ONE-HALF TABLET BY MOUTH TWICE DAILY 90 tablet 2  . montelukast (SINGULAIR) 10 MG tablet TK 1 T PO QD  2  . Multiple Vitamins-Minerals (ONE-A-DAY WOMENS 50+ ADVANTAGE PO) Take by mouth.    . rosuvastatin (CRESTOR) 40 MG tablet TAKE ONE TABLET BY MOUTH EVERY DAY 90 tablet 1  . furosemide (LASIX) 40 MG tablet Take 40 mg by  mouth as directed.     No current facility-administered medications for this encounter.     Allergies  Allergen Reactions  . Meloxicam Other (See Comments)    Severe reflux  . Codeine Nausea And Vomiting    migraine  . Doxycycline Itching and Swelling    Facial  . Hydrocodone Nausea And Vomiting    MIGRAINE  . Hydromorphone Nausea And Vomiting    migraine  . Oxycodone Nausea And Vomiting    Severe migraine    Social History   Socioeconomic History  . Marital status: Widowed    Spouse name: Deceased  . Number of children: 2  . Years of education: 31  . Highest education level: Not on file  Social Needs  . Financial resource strain: Not on file  . Food insecurity - worry: Not on  file  . Food insecurity - inability: Not on file  . Transportation needs - medical: Not on file  . Transportation needs - non-medical: Not on file  Occupational History  . Occupation: Retired  Tobacco Use  . Smoking status: Never Smoker  . Smokeless tobacco: Never Used  Substance and Sexual Activity  . Alcohol use: No    Alcohol/week: 1.8 oz    Types: 3 Standard drinks or equivalent per week  . Drug use: No  . Sexual activity: No  Other Topics Concern  . Not on file  Social History Narrative   She is a widow.  Husband died from metastatic renal cell cancer. Originally from [place]. Previously lived in [place] from 1975-[place]. Moved to [place] in 2007. No mold exposure recently but did have it through a prior work exposure in 1993. Has a masters in public health. No bird exposure.      [facility] (09/29/17):   She has moved into a retirement community since last appointment. She reports she had testing at her new residence that was positive for mold. It has since been treated.     Family History  Problem Relation Age of Onset  . Hypertension Mother   . Arrhythmia Mother   . Heart failure Mother   . Arrhythmia Brother   . Stroke Brother 9       cerebral hemorrhage, nonsmoker, no  HTN  . Prostate cancer Brother   . Stroke Father        from an aneurysm  . Stroke Maternal Aunt 83       cerebral hemorrhage  . Liver cancer Maternal Grandmother   . Atrial fibrillation Son   . Celiac disease Son   . Heart attack Neg Hx   . Breast cancer Neg Hx     ROS- All systems are reviewed and negative except as per the HPI above  Physical Exam: Vitals:   10/16/17 1034  BP: 118/84  Pulse: 74  Weight: 136 lb 3.2 oz (61.8 kg)  Height: 5\' 5"  (1.651 m)   Wt Readings from Last 3 Encounters:  10/16/17 136 lb 3.2 oz (61.8 kg)  10/04/17 134 lb (60.8 kg)  09/29/17 134 lb 2 oz (60.8 kg)    Labs: Lab Results  Component Value Date   NA 138 10/04/2017   K 3.6 10/04/2017   CL 102 10/04/2017   CO2 28 10/04/2017   GLUCOSE 102 (H) 10/04/2017   BUN 27 (H) 10/04/2017   CREATININE 0.84 10/04/2017   CALCIUM 9.3 10/04/2017   MG 2.0 11/05/2015   Lab Results  Component Value Date   INR 1.11 05/06/2017   Lab Results  Component Value Date   CHOL 138 08/26/2017   HDL 59.50 08/26/2017   LDLCALC 68 08/26/2017   TRIG 50.0 08/26/2017     GEN- The patient is well appearing, alert and oriented x 3 today.   Head- normocephalic, atraumatic Eyes-  Sclera clear, conjunctiva pink Ears- hearing intact Oropharynx- clear Neck- supple, no JVP Lymph- no cervical lymphadenopathy Lungs- Clear to ausculation bilaterally, normal work of breathing Heart- Regular rate and rhythm, no murmurs, rubs or gallops, PMI not laterally displaced GI- soft, NT, ND, + BS Extremities- no clubbing, cyanosis, or edema MS- no significant deformity or atrophy Skin- no rash or lesion Psych- euthymic mood, full affect Neuro- strength and sensation are intact  EKG-a paced at 106 bpm,, pr int 132 ms, qrs int 106 ms, qtc 502 ms Epic records reviewed  Assessment and Plan: 1. MVR s/p maze procedure Still feels quite improved since surgery No know significant afib since then Continue metoprolol 25 mg  1/2 tab bid Continue eliquis 5 mg bid for chadsvasc score of 5  2. PPM Per Dr. Lovena Le  F/u in one year   Geroge Baseman. Ceylin Dreibelbis, [facility] [address] [place], [place] [address] [phone]

## 2017-10-24 ENCOUNTER — Ambulatory Visit
Admission: RE | Admit: 2017-10-24 | Discharge: 2017-10-24 | Disposition: A | Discharge status: Home / Self Care | Payer: Medicare Other | Source: Ambulatory Visit | Attending: Neurology | Admitting: Neurology

## 2017-10-24 DIAGNOSIS — I651 Occlusion and stenosis of basilar artery: Secondary | ICD-10-CM | POA: Insufficient documentation

## 2017-10-24 DIAGNOSIS — I708 Atherosclerosis of other arteries: Secondary | ICD-10-CM | POA: Diagnosis not present

## 2017-10-24 DIAGNOSIS — I6521 Occlusion and stenosis of right carotid artery: Secondary | ICD-10-CM | POA: Diagnosis not present

## 2017-10-24 DIAGNOSIS — Z8673 Personal history of transient ischemic attack (TIA), and cerebral infarction without residual deficits: Secondary | ICD-10-CM

## 2017-10-24 DIAGNOSIS — R2990 Unspecified symptoms and signs involving the nervous system: Secondary | ICD-10-CM | POA: Insufficient documentation

## 2017-10-24 MED ORDER — IOPAMIDOL (ISOVUE-370) INJECTION 76%
75.0000 mL | Freq: Once | INTRAVENOUS | Status: AC | PRN
  Administered 2017-10-24: 75 mL via INTRAVENOUS

## 2017-11-04 ENCOUNTER — Ambulatory Visit (INDEPENDENT_AMBULATORY_CARE_PROVIDER_SITE_OTHER): Payer: Medicare Other | Admitting: Cardiovascular Disease

## 2017-11-04 ENCOUNTER — Encounter: Payer: Self-pay | Admitting: Cardiovascular Disease

## 2017-11-04 VITALS — BP 90/58 | HR 78 | Ht 65.0 in | Wt 137.8 lb

## 2017-11-04 DIAGNOSIS — Z952 Presence of prosthetic heart valve: Secondary | ICD-10-CM

## 2017-11-04 DIAGNOSIS — Z8679 Personal history of other diseases of the circulatory system: Secondary | ICD-10-CM

## 2017-11-04 DIAGNOSIS — Z9889 Other specified postprocedural states: Secondary | ICD-10-CM | POA: Diagnosis not present

## 2017-11-04 NOTE — Patient Instructions (Signed)
Medication Instructions:  Your physician recommends that you continue on your current medications as directed. Please refer to the Current Medication list given to you today.   Labwork: None Ordered   Testing/Procedures: None Ordered   Follow-Up: Your physician wants you to follow-up in: 6 months with Dr. Nahser.  You will receive a reminder letter in the mail two months in advance. If you don't receive a letter, please call our office to schedule the follow-up appointment.   If you need a refill on your cardiac medications before your next appointment, please call your pharmacy.   Thank you for choosing [facility]! Aneesha Holloran, RN [phone]    

## 2017-11-04 NOTE — Progress Notes (Signed)
[name] [name] Date of Birth  [birth year]       [facility] Office [address], [place], [place]  [address]   [place], [place]  [address] [phone]     [phone]   Fax  [phone]    Fax [phone]  Problem List: 1. Supraventricular tachycardia 2. Mitral valve prolapse - s/p mitral valve replacement  3. Cataracts 4.     [name] [name] is a  76 yo with hx of SVT and MVP.   She has done well.  She has not been exercising as much as she would like.  She has not had any syncope or presyncope.  She has been taking care of her granddaughter who has ADD and a sleep disorder.  [name] [name] did not get much sleep that weekend and had lots of palpitations for the following week.  Oct. 7, 2014  [name] [name] is seen after a 1 1/2 year absence.  She has had some fatigue.   Dr. Derrel Nip has found thrombocytopenia.  Crestor was stopped but this did not help the thrombocytopenia.   She has seen a hemotologist.    She fell  (caught her toe at a [employer] - not syncope) several months ago and had reconstruction of her jaw.  She also broke her left wrist.     Oct. 2, 2015 :  [name] [name] is doing well from a cardiac standpoint.  Her cholesterol levels are slightly elevated ( even on Crestor 5 mg a day).  She has had marginally low platelets .  Her cholesterol levels are still minimally elevated.    She had some dyspnea this summer.  Currently , she has no limitations.  She thinks the symptoms may have been due to allergies.  No chest pain.      She thinks that her stamina is not as good as it should be.  She does yoga but no cardio workouts.    Sept 6, 2016:  [name] [name] is seen today for a recent episode of paroxysmal atrial fib August 21 was shopping , felt very weak .   Could not walk to her car.  Very short of breath .  She went to [place] a day or so later.  Gradually had worsening chest tightness, allergy, weakness.  Thought it was allergies.    Had significant leg swelling .   She went to the hospital. She was found have rapid atrial fibrillation with evidence of congestive heart failure. Her echocardiogram showed normal left ventricle systolic function. She has mild mitral regurgitation. Was significant calcification of the mitral valve annulus. She was started on Lovenox. She's not received Coumadin at this point.  She was started on diltiazem and Lasix.  Was started on Lovenox ( because of the atrial fib in the setting of MR and itral valve calcification .   She seems to be doing quite a lot at her. She's been going to doctors appointments and has been walking across the parking lot  without any CP with problems.  Sept. 13, 2016:  [name] [name] is seen back for a 1 week visit. She was found to have rapid atrial fib last week Had some dyspnea last night - better with a Xopenex sample inhaler.  Feels better on the Eliquis and Metoprolol .  Feels more relaxed.  Cannot tell if she is in atrial fib Has low energy .  Difficult to walk to the mailbox without extreme fatigue ,  not specifically short of breath   Oct. 11, 2016:  Was cardioverted several weeks ago.  Feeling better every day since that   Nov. 10, 2016:  Has done well.  Steady improvement since  the cardioversion.  Then for the past several days/ weeks has not been doing quite a good Has shortness of breath with showering and doing everyday activities.    Feb. 10, 2017:  Is doing well - has had Mitral valve replacement and MAZE procedure since I last saw her  Just this past week, she felt much better.   Has been fatigued but now has more energetic . Has gain  Apr 16, 2016:  [name] [name] has had an atrial pacer lead revision since I last saw her. Feels "a bit off since last Thursday " Was bending over to prune some bushes.  Has not felt right since that time  Goes to the gym Geophysicist/field seismologist)  Feels "odd" when she bends over.  Has been anxious recently   Sept. 18,  2017:  [name] [name] is seen today for follow up visit Doing well. No cardiac complaints.   Feb. 27, 2018:  [name] [name] presents today for evaluation of leg weakness. She has trouble walking from her car to the cardiac rehab but then is able to exercise on the treadmill for 30 minutes without any problems .   Not in atrial fib  Does not take the lasix on a regular basis   May 06, 2017:  [name] [name] is seen back for follow up visit  She is doing much better.   She was having lots of DOE Dr. Derrel Nip found that she was anemic and prescribed iron.  Since starting iron, she has been feeling much better.  Is planning on moving  into [employer] .    This past Saturday , had what may have been a TIA in her left hand.   Had numbness - lasted 4 minutes.  Felt "dead" but she never lost use of her hand. She was able to shake her hand and work the "deadness" out  Has not missed any doses of Eliquis.  November 04, 2017:  [name] [name] is seen today for follow-up visit.  She is doing well following her mitral valve replacement.  She has a history of atrial fibrillation and is status post Maze procedure.  She saw atrial fib clinic on November 15 and she has done well.  Her pacemaker interrogation reveals less than 1% atrial fibrillation burden.  Has moved into [facility].  Is having some allergy to dust and molds.  Is having lots more allery issues - has restarted Sinular,   Having some back issues. Was bending over to unload a box,   Felt a pull in her back and immediately had numbness in her left hand and foot.     Current Outpatient Medications on File Prior to Visit  Medication Sig Dispense Refill  . ALPRAZolam (XANAX) 0.25 MG tablet Take 1 tablet (0.25 mg total) by mouth at bedtime as needed for anxiety or sleep. 30 tablet 5  . amoxicillin (AMOXIL) 500 MG capsule TAKE 4 CAPSULES BY MOUTH 60 MINUTES PRIOR TO DENTAL WORK 30 capsule 0  . b complex vitamins tablet Take 1 tablet by mouth daily.    .  butalbital-acetaminophen-caffeine (FIORICET, ESGIC) 50-325-40 MG tablet TAKE 1 TABLET BY MOUTH AS NEEDED FOR BACK PAIN OR HEADACHE 90 tablet 2  . Cholecalciferol (VITAMIN D) 2000 units CAPS Take 2,000 Units by mouth daily.    Marland Kitchen  Coenzyme Q10 (CO Q 10) 100 MG CAPS Take 200 mg by mouth 2 (two) times daily.     Marland Kitchen ELIQUIS 5 MG TABS tablet TAKE 1 TABLET BY MOUTH TWICE DAILY 180 tablet 3  . FeFum-FePo-FA-B Cmp-C-Zn-Mn-Cu (SE-TAN PLUS) 162-115.2-1 MG CAPS TAKE 1 CAPSULE EVERY DAY WITH BREAKFAST 30 capsule 3  . furosemide (LASIX) 40 MG tablet Take 40 mg by mouth as directed.    . levETIRAcetam (KEPPRA) 500 MG tablet Take 500 mg 2 (two) times daily by mouth.    . metoprolol tartrate (LOPRESSOR) 25 MG tablet TAKE ONE-HALF TABLET BY MOUTH TWICE DAILY 90 tablet 2  . montelukast (SINGULAIR) 10 MG tablet TK 1 T PO QD  2  . Multiple Vitamins-Minerals (ONE-A-DAY WOMENS 50+ ADVANTAGE PO) Take by mouth.    . rosuvastatin (CRESTOR) 40 MG tablet TAKE ONE TABLET BY MOUTH EVERY DAY 90 tablet 1  . albuterol (PROVENTIL HFA;VENTOLIN HFA) 108 (90 BASE) MCG/ACT inhaler Inhale 2 puffs into the lungs every 6 (six) hours as needed for wheezing or shortness of breath. (Patient not taking: Reported on 11/04/2017) 1 Inhaler 2  . azelastine (ASTELIN) 0.1 % nasal spray Place 1 spray into both nostrils as needed for allergies.   5  . loratadine (CLARITIN) 10 MG tablet Take 10 mg by mouth daily as needed for allergies.     No current facility-administered medications on file prior to visit.     Allergies  Allergen Reactions  . Meloxicam Other (See Comments)    Severe reflux  . Codeine Nausea And Vomiting    migraine  . Doxycycline Itching and Swelling    Facial  . Hydrocodone Nausea And Vomiting    MIGRAINE  . Hydromorphone Nausea And Vomiting    migraine  . Oxycodone Nausea And Vomiting    Severe migraine    Past Medical History:  Diagnosis Date  . Acute on chronic diastolic (congestive) heart failure ([place])   .  Allergy    See list  . Anemia 2018  . Anxiety    Occasionally take Xanax for sleep  . Anxiety associated with depression    Prn alprazolam   . Arthritis    Hands, Back  . Atrial fibrillation, persistent ([place])    DCCV 08/22/2015  . Bradycardia post-op bradycardia, pacer dependent   MDT PPM 11/06/15, Dr. Lovena Le  . Cancer ([place]) 1991   Basal cell on nose  . Cataract    Left eye  . Diverticulosis   . Heart murmur   . Hemorrhoid   . Hepatic cyst    innumerable  . 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 Hyperlipidemia   . IBS (irritable bowel syndrome)   . Idiopathic thrombocytopenic purpura (ITP)   . Migraine   . MVP (mitral valve prolapse)   . Nodule of right lung   . Osteoporosis of forearm   . RBBB   . Restrictive lung disease    Mild on PFT & likely cardiac in etiology   . S/P Minimally invasive maze operation for atrial fibrillation 10/31/2015   Complete bilateral atrial lesion set using cryothermy and bipolar radiofrequency ablation with clipping of LA appendage via right mini thoracotomy approach  . S/P minimally invasive mitral valve replacement with bioprosthetic valve 10/31/2015   33 mm [facility] Mitral bovine bioprosthetic tissue valve placed via right mini thoracotomy approach  . Severe mitral regurgitation   . SVT (supraventricular tachycardia) ([place])   . Thoracic aorta atherosclerosis ([place])   . TIA (transient ischemic attack)  Past Surgical History:  Procedure Laterality Date  . BREAST BIOPSY Right Late 80s   Negative  . CARDIAC CATHETERIZATION N/A 10/18/2015   Procedure: Right/Left Heart Cath and Coronary Angiography;  Surgeon: Sherren Mocha, MD;  Location: [place] [facility];  Service: Cardiovascular;  Laterality: N/A;  . CARDIOVERSION N/A 08/22/2015   Procedure: CARDIOVERSION;  Surgeon: Thayer Headings, MD;  Location: [place];  Service: Cardiovascular;  Laterality: N/A;  . COLONOSCOPY  2003  . EP IMPLANTABLE DEVICE N/A 11/06/2015   Procedure: Pacemaker  Implant;  Surgeon: Evans Lance, MD;  Location: [place] [facility];  Service: Cardiovascular;  Laterality: N/A;  . EP IMPLANTABLE DEVICE N/A 02/20/2016   Procedure: Lead Extraction;  Surgeon: Evans Lance, MD;  Location: [place] [facility];  Service: Cardiovascular;  Laterality: N/A;  . EYE SURGERY Right 2013  . FOOT SURGERY    . MANDIBLE FRACTURE SURGERY  03-26-13  . MINIMALLY INVASIVE MAZE PROCEDURE N/A 10/31/2015   Procedure: MINIMALLY INVASIVE MAZE PROCEDURE;  Surgeon: Rexene Alberts, MD;  Location: [place];  Service: Open Heart Surgery;  Laterality: N/A;  . MITRAL VALVE REPLACEMENT Right 10/31/2015   Procedure: MINIMALLY INVASIVE MITRAL VALVE (MV) REPLACEMENT;  Surgeon: Rexene Alberts, MD;  Location: [facility];  Service: Open Heart Surgery;  Laterality: Right;  . PACEMAKER LEAD REMOVAL  02/20/2016  . TEE WITH CARDIOVERSION    . TEE WITHOUT CARDIOVERSION N/A 08/22/2015   Procedure: TRANSESOPHAGEAL ECHOCARDIOGRAM (TEE);  Surgeon: Thayer Headings, MD;  Location: [facility];  Service: Cardiovascular;  Laterality: N/A;  . TEE WITHOUT CARDIOVERSION N/A 10/31/2015   Procedure: TRANSESOPHAGEAL ECHOCARDIOGRAM (TEE);  Surgeon: Rexene Alberts, MD;  Location: [facility];  Service: Open Heart Surgery;  Laterality: N/A;  . TUBAL LIGATION    . VARICOSE VEIN SURGERY Right     Social History   Tobacco Use  Smoking Status Never Smoker  Smokeless Tobacco Never Used    Social History   Substance and Sexual Activity  Alcohol Use No  . Alcohol/week: 1.8 oz  . Types: 3 Standard drinks or equivalent per week    Family History  Problem Relation Age of Onset  . Hypertension Mother   . Arrhythmia Mother   . Heart failure Mother   . Arrhythmia Brother   . Stroke Brother 69       cerebral hemorrhage, nonsmoker, no HTN  . Prostate cancer Brother   . Stroke Father        from an aneurysm  . Stroke Maternal Aunt 83       cerebral hemorrhage  . Liver cancer Maternal Grandmother   . Atrial  fibrillation Son   . Celiac disease Son   . Heart attack Neg Hx   . Breast cancer Neg Hx     Reviw of Systems:  Reviewed in the HPI.  All other systems are negative.  Physical Exam: Blood pressure (!) 90/58, pulse 78, height 5\' 5"  (1.651 m), weight 137 lb 12.8 oz (62.5 kg), SpO2 99 %.  GEN:  Well nourished, well developed in no acute distress HEENT: Normal NECK: No JVD; No carotid bruits LYMPHATICS: No lymphadenopathy CARDIAC: RR, soft systolic  murmur , rubs, gallops RESPIRATORY:  Clear to auscultation without rales, wheezing or rhonchi  ABDOMEN: Soft, non-tender, non-distended MUSCULOSKELETAL:  No edema; No deformity  SKIN: Warm and dry NEUROLOGIC:  Alert and oriented x 3   ECG:     Assessment / Plan:   1.  ? TIA :  No further TIA symptoms .  Has some plaque in her distal basilar artery .   2. Dyspnea with exertion:    3. Persistent Atrial fibrillation with rapid ventricular response:  Has been maintaining sinus rhythm.  She is status post Maze procedure.  Continue Eliquis.Marland Kitchen   4.  MVP with MR: She is doing well with this following her mitral valve replacement.  5. Congestive heart failure: her CHF symptoms have greatly improved. Will repeat the echo to make sure her EF is stable   6. Pacemaker: She status post pacemaker implantation. Has not had any recent mode switches.  7. Hyperlipidemia:   Lipids look great.     8.  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  This seems to cause hand and foot numbness and loss of function.  She will follow-up with neurology.   Mertie Moores, MD  11/04/2017 9:03 AM    [address],  [place], [place]  [address] Pager [phone] Phone: [phone]; Fax: [phone]

## 2017-11-17 ENCOUNTER — Telehealth: Payer: Self-pay

## 2017-11-17 NOTE — Telephone Encounter (Signed)
Duplicate encounter

## 2017-11-17 NOTE — Telephone Encounter (Signed)
Patient calls today and states her Eliquis has jumped to approx $178.00 for a 90 day supply. She states she was using a $10. copay card. I spoke to a pharmacist at her store, and she indicated patient was entering the donut hole, and that was why prices were higher. I offered her a 2 week supply of Eliquis to get her through to the end of the year. She was happy with this plan.

## 2017-11-28 ENCOUNTER — Telehealth: Payer: Self-pay | Admitting: Cardiology

## 2017-11-28 ENCOUNTER — Ambulatory Visit (INDEPENDENT_AMBULATORY_CARE_PROVIDER_SITE_OTHER): Payer: Medicare Other | Admitting: *Deleted

## 2017-11-28 DIAGNOSIS — I495 Sick sinus syndrome: Secondary | ICD-10-CM

## 2017-11-28 NOTE — Telephone Encounter (Signed)
LMOVM reminding pt to send remote transmission.   

## 2017-12-01 ENCOUNTER — Encounter: Payer: Self-pay | Admitting: Cardiology

## 2017-12-01 NOTE — Progress Notes (Signed)
Remote pacemaker transmission.   

## 2017-12-04 ENCOUNTER — Encounter: Payer: Self-pay | Admitting: Internal Medicine

## 2017-12-05 ENCOUNTER — Other Ambulatory Visit: Payer: Self-pay | Admitting: Internal Medicine

## 2017-12-05 DIAGNOSIS — D509 Iron deficiency anemia, unspecified: Secondary | ICD-10-CM

## 2017-12-05 DIAGNOSIS — K7689 Other specified diseases of liver: Secondary | ICD-10-CM

## 2017-12-05 NOTE — Progress Notes (Signed)
MyChart message sent .  Needs labs repeated

## 2017-12-11 ENCOUNTER — Encounter: Payer: Self-pay | Admitting: Internal Medicine

## 2017-12-11 DIAGNOSIS — M19042 Primary osteoarthritis, left hand: Secondary | ICD-10-CM

## 2017-12-11 DIAGNOSIS — M19041 Primary osteoarthritis, right hand: Secondary | ICD-10-CM

## 2017-12-11 LAB — CUP PACEART REMOTE DEVICE CHECK
Battery Impedance: 184 Ohm
Battery Remaining Longevity: 107 mo
Battery Voltage: 2.79 V
Brady Statistic AP VP Percent: 98 %
Brady Statistic AP VS Percent: 0 %
Brady Statistic AS VP Percent: 2 %
Brady Statistic AS VS Percent: 0 %
Date Time Interrogation Session: 20181228185002
Implantable Lead Implant Date: 20161205
Implantable Lead Implant Date: 20170321
Implantable Lead Location: 753859
Implantable Lead Location: 753860
Implantable Lead Model: 5076
Implantable Lead Model: 5076
Implantable Pulse Generator Implant Date: 20161205
Lead Channel Impedance Value: 486 Ohm
Lead Channel Impedance Value: 760 Ohm
Lead Channel Pacing Threshold Amplitude: 0.5 V
Lead Channel Pacing Threshold Amplitude: 0.5 V
Lead Channel Pacing Threshold Pulse Width: 0.4 ms
Lead Channel Pacing Threshold Pulse Width: 0.4 ms
Lead Channel Setting Pacing Amplitude: 2 V
Lead Channel Setting Pacing Amplitude: 2.5 V
Lead Channel Setting Pacing Pulse Width: 0.4 ms
Lead Channel Setting Sensing Sensitivity: 4 mV

## 2017-12-14 DIAGNOSIS — M19041 Primary osteoarthritis, right hand: Secondary | ICD-10-CM | POA: Insufficient documentation

## 2017-12-14 DIAGNOSIS — M19042 Primary osteoarthritis, left hand: Secondary | ICD-10-CM | POA: Insufficient documentation

## 2017-12-16 ENCOUNTER — Telehealth: Payer: Self-pay | Admitting: Internal Medicine

## 2017-12-16 DIAGNOSIS — M19041 Primary osteoarthritis, right hand: Secondary | ICD-10-CM

## 2017-12-16 DIAGNOSIS — M19042 Primary osteoarthritis, left hand: Secondary | ICD-10-CM

## 2017-12-16 NOTE — Telephone Encounter (Signed)
Copied from [place] [phone]. Topic: Quick Communication - See Telephone Encounter >> Dec 16, 2017 10:41 AM Robina Ade, Helene Kelp D wrote: Leighton Roach from [facility] called and would like a PT order for bilateral hand to be changed to OT bilateral hand. Please call her back at [phone]. CRM for notification. See Telephone encounter for: 12/16/17.

## 2017-12-17 NOTE — Telephone Encounter (Signed)
Spoke with Tiffany at [facility] and informed her that Dr. Derrel Nip is okay with changing the PT to OT bilateral hand. Order has been faxed.

## 2017-12-23 ENCOUNTER — Other Ambulatory Visit: Payer: Self-pay | Admitting: Internal Medicine

## 2018-02-22 ENCOUNTER — Encounter: Payer: Self-pay | Admitting: Internal Medicine

## 2018-02-23 ENCOUNTER — Telehealth: Payer: Self-pay

## 2018-02-23 MED ORDER — FLUCONAZOLE 150 MG PO TABS: 150.0000 mg | ORAL_TABLET | Freq: Every day | ORAL | 0 refills | Status: DC

## 2018-02-23 NOTE — Telephone Encounter (Signed)
Medication list updated per pt's mychart message.

## 2018-02-26 ENCOUNTER — Other Ambulatory Visit (INDEPENDENT_AMBULATORY_CARE_PROVIDER_SITE_OTHER): Payer: Medicare Other

## 2018-02-26 DIAGNOSIS — K7689 Other specified diseases of liver: Secondary | ICD-10-CM | POA: Diagnosis not present

## 2018-02-26 DIAGNOSIS — D509 Iron deficiency anemia, unspecified: Secondary | ICD-10-CM

## 2018-02-26 LAB — CBC WITH DIFFERENTIAL/PLATELET
Basophils Absolute: 31 cells/uL (ref 0–200)
Basophils Relative: 0.7 %
Eosinophils Absolute: 141 cells/uL (ref 15–500)
Eosinophils Relative: 3.2 %
HCT: 32.5 % — ABNORMAL LOW (ref 35.0–45.0)
Hemoglobin: 10.4 g/dL — ABNORMAL LOW (ref 11.7–15.5)
Lymphs Abs: 1575 cells/uL (ref 850–3900)
MCH: 27.7 pg (ref 27.0–33.0)
MCHC: 32 g/dL (ref 32.0–36.0)
MCV: 86.7 fL (ref 80.0–100.0)
MPV: 12.9 fL — ABNORMAL HIGH (ref 7.5–12.5)
Monocytes Relative: 8.2 %
Neutro Abs: 2292 cells/uL (ref 1500–7800)
Neutrophils Relative %: 52.1 %
Platelets: 124 10*3/uL — ABNORMAL LOW (ref 140–400)
RBC: 3.75 10*6/uL — ABNORMAL LOW (ref 3.80–5.10)
RDW: 12.7 % (ref 11.0–15.0)
Total Lymphocyte: 35.8 %
WBC mixed population: 361 cells/uL (ref 200–950)
WBC: 4.4 10*3/uL (ref 3.8–10.8)

## 2018-02-26 LAB — COMPREHENSIVE METABOLIC PANEL
AG Ratio: 2.2 (calc) (ref 1.0–2.5)
ALT: 16 U/L (ref 6–29)
AST: 24 U/L (ref 10–35)
Albumin: 4.3 g/dL (ref 3.6–5.1)
Alkaline phosphatase (APISO): 56 U/L (ref 33–130)
BUN/Creatinine Ratio: 30 (calc) — ABNORMAL HIGH (ref 6–22)
BUN: 27 mg/dL — ABNORMAL HIGH (ref 7–25)
CO2: 27 mmol/L (ref 20–32)
Calcium: 9.3 mg/dL (ref 8.6–10.4)
Chloride: 103 mmol/L (ref 98–110)
Creat: 0.9 mg/dL (ref 0.60–0.93)
Globulin: 2 g/dL (calc) (ref 1.9–3.7)
Glucose, Bld: 89 mg/dL (ref 65–99)
Potassium: 3.7 mmol/L (ref 3.5–5.3)
Sodium: 140 mmol/L (ref 135–146)
Total Bilirubin: 0.4 mg/dL (ref 0.2–1.2)
Total Protein: 6.3 g/dL (ref 6.1–8.1)

## 2018-02-26 LAB — IRON,TIBC AND FERRITIN PANEL
%SAT: 13 % (calc) (ref 11–50)
Ferritin: 10 ng/mL — ABNORMAL LOW (ref 20–288)
Iron: 49 ug/dL (ref 45–160)
TIBC: 368 mcg/dL (calc) (ref 250–450)

## 2018-02-26 NOTE — Addendum Note (Signed)
Addended by: Arby Barrette on: 02/26/2018 08:17 AM   Modules accepted: Orders

## 2018-02-27 ENCOUNTER — Encounter: Payer: Self-pay | Admitting: Internal Medicine

## 2018-03-03 ENCOUNTER — Ambulatory Visit (INDEPENDENT_AMBULATORY_CARE_PROVIDER_SITE_OTHER): Payer: Medicare Other | Admitting: *Deleted

## 2018-03-03 DIAGNOSIS — I495 Sick sinus syndrome: Secondary | ICD-10-CM | POA: Diagnosis not present

## 2018-03-03 NOTE — Progress Notes (Signed)
Remote pacemaker transmission.   

## 2018-03-04 ENCOUNTER — Encounter: Payer: Self-pay | Admitting: Cardiology

## 2018-03-10 LAB — CUP PACEART REMOTE DEVICE CHECK
Battery Impedance: 208 Ohm
Battery Remaining Longevity: 101 mo
Battery Voltage: 2.79 V
Brady Statistic AP VP Percent: 98 %
Brady Statistic AP VS Percent: 0 %
Brady Statistic AS VP Percent: 2 %
Brady Statistic AS VS Percent: 0 %
Date Time Interrogation Session: 20190402140014
Implantable Lead Implant Date: 20161205
Implantable Lead Implant Date: 20170321
Implantable Lead Location: 753859
Implantable Lead Location: 753860
Implantable Lead Model: 5076
Implantable Lead Model: 5076
Implantable Pulse Generator Implant Date: 20161205
Lead Channel Impedance Value: 451 Ohm
Lead Channel Impedance Value: 703 Ohm
Lead Channel Pacing Threshold Amplitude: 0.5 V
Lead Channel Pacing Threshold Amplitude: 0.5 V
Lead Channel Pacing Threshold Pulse Width: 0.4 ms
Lead Channel Pacing Threshold Pulse Width: 0.4 ms
Lead Channel Setting Pacing Amplitude: 2 V
Lead Channel Setting Pacing Amplitude: 2.5 V
Lead Channel Setting Pacing Pulse Width: 0.4 ms
Lead Channel Setting Sensing Sensitivity: 4 mV

## 2018-03-12 ENCOUNTER — Ambulatory Visit (INDEPENDENT_AMBULATORY_CARE_PROVIDER_SITE_OTHER): Payer: Medicare Other

## 2018-03-12 VITALS — BP 106/62 | HR 78 | Temp 98.3°F | Resp 12 | Ht 65.0 in | Wt 138.4 lb

## 2018-03-12 DIAGNOSIS — Z0000 Encounter for general adult medical examination without abnormal findings: Secondary | ICD-10-CM

## 2018-03-12 NOTE — Patient Instructions (Addendum)
  [name] , Thank you for taking time to come for your Medicare Wellness Visit. I appreciate your ongoing commitment to your health goals. Please review the following plan we discussed and let me know if I can assist you in the future.   Follow up as needed.    Have a great day!  These are the goals we discussed: Goals    . Maintain Healthy Lifestyle     Exercise as tolerated, continue with yoga Stay hydrated Healthy diet       This is a list of the screening recommended for you and due dates:  Health Maintenance  Topic Date Due  . Flu Shot  07/02/2018  . Tetanus Vaccine  06/25/2022  . DEXA scan (bone density measurement)  Completed  . Pneumonia vaccines  Completed

## 2018-03-12 NOTE — Progress Notes (Signed)
Subjective:   [name] [name] is a 77 y.o. female who presents for Medicare Annual (Subsequent) preventive examination.  Review of Systems:  No ROS.  Medicare Wellness Visit. Additional risk factors are reflected in the social history.  Cardiac Risk Factors include: advanced age (>81men, >48 women)     Objective:     Vitals: BP 106/62 (BP Location: Left Arm, Patient Position: Sitting, Cuff Size: Normal)   Pulse 78   Temp 98.3 F (36.8 C) (Oral)   Resp 12   Ht 5\' 5"  (1.651 m)   Wt 138 lb 6.4 oz (62.8 kg)   SpO2 98%   BMI 23.03 kg/m   Body mass index is 23.03 kg/m.  Advanced Directives 03/12/2018 10/04/2017 05/27/2017 05/06/2017 05/03/2016 02/20/2016 11/29/2015  Does Patient Have a Medical Advance Directive? Yes No;Yes Yes No Yes Yes Yes  Type of Paramedic of [place];Living will Healthcare Power of [place];Living will - Living will;Healthcare Power of [place];Living will [place];Living will  Does patient want to make changes to medical advance directive? No - Patient declined - No - Patient declined - - No - Patient declined No - Patient declined  Copy of [place] in Chart? Yes - Yes - Yes Yes Yes  Would patient like information on creating a medical advance directive? - No - Patient declined - No - Patient declined - - -    Tobacco Social History   Tobacco Use  Smoking Status Never Smoker  Smokeless Tobacco Never Used     Counseling given: Not Answered   Clinical Intake:  Pre-visit preparation completed: Yes  Pain : No/denies pain     Nutritional Status: BMI of 19-24  Normal Diabetes: No(Prediabetes)  How often do you need to have someone help you when you read instructions, pamphlets, or other written materials from your doctor or pharmacy?: 1 - Never  Interpreter Needed?: No     Past Medical History:  Diagnosis Date  . Acute on  chronic diastolic (congestive) heart failure ([place])   . Allergy    See list  . Anemia 2018  . Anxiety    Occasionally take Xanax for sleep  . Anxiety associated with depression    Prn alprazolam   . Arthritis    Hands, Back  . Atrial fibrillation, persistent ([place])    DCCV 08/22/2015  . Bradycardia post-op bradycardia, pacer dependent   MDT PPM 11/06/15, Dr. Lovena Le  . Cancer ([place]) 1991   Basal cell on nose  . Cataract    Left eye  . Diverticulosis   . Heart murmur   . Hemorrhoid   . Hepatic cyst    innumerable  . 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 Hyperlipidemia   . IBS (irritable bowel syndrome)   . 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 Migraine   . MVP (mitral valve prolapse)   . Nodule of right lung   . Osteoporosis of forearm   . RBBB   . Restrictive lung disease    Mild on PFT & likely cardiac in etiology   . S/P Minimally invasive maze operation for atrial fibrillation 10/31/2015   Complete bilateral atrial lesion set using cryothermy and bipolar radiofrequency ablation with clipping of LA appendage via right mini thoracotomy approach  . S/P minimally invasive mitral valve replacement with bioprosthetic valve 10/31/2015   33 mm [facility] Mitral bovine bioprosthetic tissue valve placed via right mini  thoracotomy approach  . Severe mitral regurgitation   . SVT (supraventricular tachycardia) ([place])   . Thoracic aorta atherosclerosis ([place])   . TIA (transient ischemic attack)    Past Surgical History:  Procedure Laterality Date  . BREAST BIOPSY Right Late 80s   Negative  . CARDIAC CATHETERIZATION N/A 10/18/2015   Procedure: Right/Left Heart Cath and Coronary Angiography;  Surgeon: Sherren Mocha, MD;  Location: [facility];  Service: Cardiovascular;  Laterality: N/A;  . CARDIOVERSION N/A 08/22/2015   Procedure: CARDIOVERSION;  Surgeon: Thayer Headings, MD;  Location: [facility];  Service: Cardiovascular;  Laterality: N/A;  . COLONOSCOPY  2003  .  EP IMPLANTABLE DEVICE N/A 11/06/2015   Procedure: Pacemaker Implant;  Surgeon: Evans Lance, MD;  Location: [facility];  Service: Cardiovascular;  Laterality: N/A;  . EP IMPLANTABLE DEVICE N/A 02/20/2016   Procedure: Lead Extraction;  Surgeon: Evans Lance, MD;  Location: [facility];  Service: Cardiovascular;  Laterality: N/A;  . EYE SURGERY Right 2013  . FOOT SURGERY    . MANDIBLE FRACTURE SURGERY  03-26-13  . MINIMALLY INVASIVE MAZE PROCEDURE N/A 10/31/2015   Procedure: MINIMALLY INVASIVE MAZE PROCEDURE;  Surgeon: Rexene Alberts, MD;  Location: [facility];  Service: Open Heart Surgery;  Laterality: N/A;  . MITRAL VALVE REPLACEMENT Right 10/31/2015   Procedure: MINIMALLY INVASIVE MITRAL VALVE (MV) REPLACEMENT;  Surgeon: Rexene Alberts, MD;  Location: [facility];  Service: Open Heart Surgery;  Laterality: Right;  . PACEMAKER LEAD REMOVAL  02/20/2016  . TEE WITH CARDIOVERSION    . TEE WITHOUT CARDIOVERSION N/A 08/22/2015   Procedure: TRANSESOPHAGEAL ECHOCARDIOGRAM (TEE);  Surgeon: Thayer Headings, MD;  Location: [facility];  Service: Cardiovascular;  Laterality: N/A;  . TEE WITHOUT CARDIOVERSION N/A 10/31/2015   Procedure: TRANSESOPHAGEAL ECHOCARDIOGRAM (TEE);  Surgeon: Rexene Alberts, MD;  Location: [facility];  Service: Open Heart Surgery;  Laterality: N/A;  . TUBAL LIGATION    . VARICOSE VEIN SURGERY Right    Family History  Problem Relation Age of Onset  . Hypertension Mother   . Arrhythmia Mother   . Heart failure Mother   . Arrhythmia Brother   . Stroke Brother 58       cerebral hemorrhage, nonsmoker, no HTN  . Prostate cancer Brother   . Stroke Father        from an aneurysm  . Stroke Maternal Aunt 83       cerebral hemorrhage  . Liver cancer Maternal Grandmother   . Atrial fibrillation Son   . Celiac disease Son   . Heart attack Neg Hx   . Breast cancer Neg Hx    Social History   Socioeconomic History  . Marital status: Widowed    Spouse name: Deceased  .  Number of children: 2  . Years of education: 64  . Highest education level: Not on file  Occupational History  . Occupation: Retired  Scientific laboratory technician  . Financial resource strain: Not hard at all  . Food insecurity:    Worry: Never true    Inability: Never true  . Transportation needs:    Medical: No    Non-medical: No  Tobacco Use  . Smoking status: Never Smoker  . Smokeless tobacco: Never Used  Substance and Sexual Activity  . Alcohol use: No    Alcohol/week: 1.8 oz    Types: 3 Standard drinks or equivalent per week  . Drug use: No  . Sexual activity: Never  Lifestyle  . Physical activity:    Days per week: Not on file    Minutes per session: Not on file  . Stress: Not on file  Relationships  . Social connections:    Talks on phone: Not on file    Gets together: Not on file    Attends religious service: Not on file    Active member of club or organization: Not on file    Attends meetings of clubs or organizations: Not on file    Relationship status: Not on file  Other Topics Concern  . Not on file  Social History Narrative   She is a widow.  Husband died from metastatic renal cell cancer. Originally from [place]. Previously lived in [place] from 1975-[place]. Moved to [place] in 2007. No mold exposure recently but did have it through a prior work exposure in 1993. Has a masters in public health. No bird exposure.      [facility] (09/29/17):   She has moved into a retirement community since last appointment. She reports she had testing at her new residence that was positive for mold. It has since been treated.     Outpatient Encounter Medications as of 03/12/2018  Medication Sig  . amoxicillin (AMOXIL) 500 MG capsule TAKE 4 CAPSULES BY MOUTH 60 MINUTES PRIOR TO DENTAL WORK  . b complex vitamins tablet Take 1 tablet by mouth daily.  . butalbital-acetaminophen-caffeine (FIORICET, ESGIC) 50-325-40 MG tablet TAKE 1 TABLET BY MOUTH AS NEEDED FOR BACK PAIN OR HEADACHE  .  Cholecalciferol (VITAMIN D) 2000 units CAPS Take 2,000 Units by mouth daily.  . Coenzyme Q10 (CO Q 10) 100 MG CAPS Take 200 mg by mouth 2 (two) times daily.   Marland Kitchen ELIQUIS 5 MG TABS tablet TAKE 1 TABLET BY MOUTH TWICE DAILY  . Ferrous Sulfate (IRON) 325 (65 Fe) MG TABS   . folic acid (FOLVITE) 517 MCG tablet   . furosemide (LASIX) 40 MG tablet Take 40 mg by mouth as directed.  . levETIRAcetam (KEPPRA) 500 MG tablet Take 500 mg by mouth daily.   . metoprolol tartrate (LOPRESSOR) 25 MG tablet TAKE ONE-HALF TABLET BY MOUTH TWICE DAILY  . rosuvastatin (CRESTOR) 40 MG tablet TAKE ONE TABLET BY MOUTH EVERY DAY  . [DISCONTINUED] albuterol (PROVENTIL HFA;VENTOLIN HFA) 108 (90 BASE) MCG/ACT inhaler Inhale 2 puffs into the lungs every 6 (six) hours as needed for wheezing or shortness of breath. (Patient not taking: Reported on 11/04/2017)  . [DISCONTINUED] fluconazole (DIFLUCAN) 150 MG tablet Take 1 tablet (150 mg total) by mouth daily.  . [DISCONTINUED] Multiple Vitamins-Minerals (ONE-A-DAY WOMENS 50+ ADVANTAGE PO) Take by mouth.   No facility-administered encounter medications on file as of 03/12/2018.     Activities of Daily Living In your present state of health, do you have any difficulty performing the following activities: 03/12/2018 05/27/2017  Hearing? N N  Vision? N N  Difficulty concentrating or making decisions? N N  Walking or climbing stairs? N N  Dressing or bathing? N N  Doing errands, shopping? N N  Preparing Food and eating ? N N  Using the Toilet? N N  In the past six months, have you accidently leaked urine? N N  Do you have problems with loss of bowel control? N N  Managing your Medications? N N  Managing your Finances? N N  Housekeeping or managing your Housekeeping? N N  Some recent data might be hidden    Patient Care Team: [name], MD as  PCP - General (Internal Medicine) Bary Castilla, Forest Gleason, MD (General Surgery) [name], MD (Internal Medicine)      Assessment:   This is a routine wellness examination for [place]. The goal of the wellness visit is to assist the patient how to close the gaps in care and create a preventative care plan for the patient.   The roster of all physicians providing medical care to patient is listed in the Snapshot section of the chart.  Taking calcium VIT D as appropriate/Osteoporosis reviewed.   Prolia injections currently administered every 6 months.  Safety issues reviewed; Life alert, smoke and carbon monoxide detectors in the home. No firearms in the home. Wears seatbelts when driving or riding with others. No violence in the home.  They do not have excessive sun exposure.  Discussed the need for sun protection: hats, long sleeves and the use of sunscreen if there is significant sun exposure.  Patient is alert, normal appearance, oriented to person/place/and time.  Correctly identified the president of the [place] and recalls of 3/3 words. Performs simple calculations and can read correct time from watch face. Displays appropriate judgement.  No new identified risk were noted.  No failures at ADL's or IADL's.    BMI- discussed the importance of a healthy diet, water intake and the benefits of aerobic exercise. Educational material provided.   24 hour diet recall: Low sodium diet, gluten free diet  Dental- UTD.  Eye- Visual acuity not assessed per patient preference since they have regular follow up with the ophthalmologist.  Wears corrective lenses.  Sleep patterns- Sleeps 6 hours at night.  Wakes feeling rested.  Health maintenance gaps- closed.  Patient Concerns: None at this time. Follow up with PCP as needed.  Exercise Activities and Dietary recommendations Current Exercise Habits: Home exercise routine, Type of exercise: yoga, Time (Minutes): 20, Frequency (Times/Week): 2, Weekly Exercise (Minutes/Week): 40, Intensity: Mild  Goals    . Maintain Healthy Lifestyle     Exercise as  tolerated, continue with yoga Stay hydrated Healthy diet       Fall Risk Fall Risk  03/12/2018 05/27/2017 02/17/2017 11/08/2016 11/29/2015  Falls in the past year? No No No No No  Comment - - - [place] Telephone Survey: data to providers prior to load -  Number falls in past yr: - - - - -  Injury with Fall? - - - - -  Comment - - - - -  Risk Factor Category  - - - - -  Risk for fall due to : - - - - History of fall(s)   Depression Screen PHQ 2/9 Scores 03/12/2018 05/27/2017 02/17/2017 02/07/2016  PHQ - 2 Score 0 0 0 0  PHQ- 9 Score - - - 0     Cognitive Function MMSE - Mini Mental State Exam 03/12/2018 05/27/2017  Orientation to time 5 5  Orientation to Place 5 5  Registration 3 3  Attention/ Calculation 5 5  Recall 3 3  Language- name 2 objects 2 2  Language- repeat 1 1  Language- follow 3 step command 3 3  Language- read & follow direction 1 1  Write a sentence 1 1  Copy design 1 1  Total score 30 30        Immunization History  Administered Date(s) Administered  . Hep A / Hep B 05/09/2016, 06/11/2016, 11/12/2016  . Influenza Split 10/10/2013  . Influenza Whole 10/10/2011  . Influenza, High Dose Seasonal PF 08/19/2016, 09/05/2017  . Influenza,inj,Quad  PF,6+ Mos 08/01/2014, 08/04/2015  . Pneumococcal Conjugate-13 01/10/2014  . Pneumococcal Polysaccharide-23 06/25/2012  . Tdap 06/25/2012  . Zoster 01/10/2006   Screening Tests Health Maintenance  Topic Date Due  . INFLUENZA VACCINE  07/02/2018  . TETANUS/TDAP  06/25/2022  . DEXA SCAN  Completed  . PNA vac Low Risk Adult  Completed      Plan:    End of life planning; Advance aging; Advanced directives discussed. Copy of current HCPOA/Living Will on file.    I have personally reviewed and noted the following in the patient's chart:   . Medical and social history . Use of alcohol, tobacco or illicit drugs  . Current medications and supplements . Functional ability and status . Nutritional status . Physical  activity . Advanced directives . List of other physicians . Hospitalizations, surgeries, and ER visits in previous 12 months . Vitals . Screenings to include cognitive, depression, and falls . Referrals and appointments  In addition, I have reviewed and discussed with patient certain preventive protocols, quality metrics, and best practice recommendations. A written personalized care plan for preventive services as well as general preventive health recommendations were provided to patient.     Varney Biles, LPN  [id number]

## 2018-03-18 ENCOUNTER — Encounter: Payer: Self-pay | Admitting: Internal Medicine

## 2018-03-23 ENCOUNTER — Telehealth: Payer: Self-pay

## 2018-03-23 NOTE — Telephone Encounter (Signed)
Pt communicated with Dr. Derrel Nip by Deloris Ping in reference to being worked in today. Please see message in encounters. Patient needs to be worked in. Thank you

## 2018-03-23 NOTE — Telephone Encounter (Signed)
Copied from [place] [id number]. Topic: Appointment Scheduling - Scheduling Inquiry for Clinic >> Mar 20, 2018 11:24 AM [place], Laban Emperor wrote: Reason for CRM: Per MyChart message between pt and Dr. Derrel Nip. Dr. Debbrah Alar pt to call to get worked in for Monday, 03/23/18, I am not able to override b/c her sch is full.  Please contact pt to sch appt.

## 2018-03-23 NOTE — Telephone Encounter (Signed)
I don't have an available appointment for this patient.

## 2018-03-23 NOTE — Telephone Encounter (Signed)
See mychart message. Pt stated that she is not having symptoms now and is going to wait until her appt on 04/06/2018

## 2018-03-31 ENCOUNTER — Ambulatory Visit (INDEPENDENT_AMBULATORY_CARE_PROVIDER_SITE_OTHER): Payer: Medicare Other

## 2018-03-31 DIAGNOSIS — M810 Age-related osteoporosis without current pathological fracture: Secondary | ICD-10-CM | POA: Diagnosis not present

## 2018-03-31 MED ORDER — DENOSUMAB 60 MG/ML ~~LOC~~ SOSY
60.0000 mg | PREFILLED_SYRINGE | Freq: Once | SUBCUTANEOUS | Status: AC
  Administered 2018-03-31: 60 mg via SUBCUTANEOUS

## 2018-03-31 NOTE — Progress Notes (Signed)
Patient given Prolia injection in left arm subcutaneous patient tolerated well.

## 2018-04-01 ENCOUNTER — Other Ambulatory Visit: Payer: Self-pay | Admitting: Internal Medicine

## 2018-04-06 ENCOUNTER — Ambulatory Visit: Payer: Medicare Other | Admitting: Internal Medicine

## 2018-04-07 ENCOUNTER — Ambulatory Visit (INDEPENDENT_AMBULATORY_CARE_PROVIDER_SITE_OTHER): Payer: Medicare Other | Admitting: Internal Medicine

## 2018-04-07 ENCOUNTER — Other Ambulatory Visit (HOSPITAL_COMMUNITY)
Admission: RE | Admit: 2018-04-07 | Discharge: 2018-04-07 | Disposition: A | Discharge status: Home / Self Care | Payer: Medicare Other | Source: Ambulatory Visit | Attending: Internal Medicine | Admitting: Internal Medicine

## 2018-04-07 ENCOUNTER — Ambulatory Visit: Payer: Medicare Other | Admitting: Internal Medicine

## 2018-04-07 ENCOUNTER — Encounter: Payer: Self-pay | Admitting: Internal Medicine

## 2018-04-07 VITALS — BP 102/60 | HR 86 | Temp 97.6°F | Resp 15 | Ht 65.0 in | Wt 137.2 lb

## 2018-04-07 DIAGNOSIS — R310 Gross hematuria: Secondary | ICD-10-CM | POA: Diagnosis not present

## 2018-04-07 DIAGNOSIS — D509 Iron deficiency anemia, unspecified: Secondary | ICD-10-CM | POA: Diagnosis not present

## 2018-04-07 DIAGNOSIS — R5383 Other fatigue: Secondary | ICD-10-CM | POA: Diagnosis not present

## 2018-04-07 DIAGNOSIS — R319 Hematuria, unspecified: Secondary | ICD-10-CM | POA: Diagnosis not present

## 2018-04-07 DIAGNOSIS — N761 Subacute and chronic vaginitis: Secondary | ICD-10-CM | POA: Diagnosis present

## 2018-04-07 DIAGNOSIS — E7800 Pure hypercholesterolemia, unspecified: Secondary | ICD-10-CM | POA: Diagnosis not present

## 2018-04-07 DIAGNOSIS — G459 Transient cerebral ischemic attack, unspecified: Secondary | ICD-10-CM | POA: Diagnosis not present

## 2018-04-07 DIAGNOSIS — R7303 Prediabetes: Secondary | ICD-10-CM

## 2018-04-07 DIAGNOSIS — Z952 Presence of prosthetic heart valve: Secondary | ICD-10-CM | POA: Diagnosis not present

## 2018-04-07 DIAGNOSIS — F418 Other specified anxiety disorders: Secondary | ICD-10-CM

## 2018-04-07 DIAGNOSIS — D696 Thrombocytopenia, unspecified: Secondary | ICD-10-CM | POA: Diagnosis not present

## 2018-04-07 LAB — COMPREHENSIVE METABOLIC PANEL
ALT: 16 U/L (ref 0–35)
AST: 25 U/L (ref 0–37)
Albumin: 4.3 g/dL (ref 3.5–5.2)
Alkaline Phosphatase: 50 U/L (ref 39–117)
BUN: 25 mg/dL — ABNORMAL HIGH (ref 6–23)
CO2: 28 mEq/L (ref 19–32)
Calcium: 9.1 mg/dL (ref 8.4–10.5)
Chloride: 103 mEq/L (ref 96–112)
Creatinine, Ser: 0.79 mg/dL (ref 0.40–1.20)
GFR: 74.91 mL/min (ref >60.00)
Glucose, Bld: 89 mg/dL (ref 70–99)
Potassium: 3.9 mEq/L (ref 3.5–5.1)
Sodium: 139 mEq/L (ref 135–145)
Total Bilirubin: 0.4 mg/dL (ref 0.2–1.2)
Total Protein: 6.8 g/dL (ref 6.0–8.3)

## 2018-04-07 LAB — CBC WITH DIFFERENTIAL/PLATELET
Basophils Absolute: 0 10*3/uL (ref 0.0–0.1)
Basophils Relative: 0.9 % (ref 0.0–3.0)
Eosinophils Absolute: 0.1 10*3/uL (ref 0.0–0.7)
Eosinophils Relative: 2 % (ref 0.0–5.0)
HCT: 34.3 % — ABNORMAL LOW (ref 36.0–46.0)
Hemoglobin: 11.5 g/dL — ABNORMAL LOW (ref 12.0–15.0)
Lymphocytes Relative: 32.3 % (ref 12.0–46.0)
Lymphs Abs: 1.7 10*3/uL (ref 0.7–4.0)
MCHC: 33.5 g/dL (ref 30.0–36.0)
MCV: 88 fl (ref 78.0–100.0)
Monocytes Absolute: 0.5 10*3/uL (ref 0.1–1.0)
Monocytes Relative: 9.1 % (ref 3.0–12.0)
Neutro Abs: 2.9 10*3/uL (ref 1.4–7.7)
Neutrophils Relative %: 55.7 % (ref 43.0–77.0)
Platelets: 109 10*3/uL — ABNORMAL LOW (ref 150.0–400.0)
RBC: 3.9 Mil/uL (ref 3.87–5.11)
RDW: 17.3 % — ABNORMAL HIGH (ref 11.5–15.5)
WBC: 5.2 10*3/uL (ref 4.0–10.5)

## 2018-04-07 LAB — LIPID PANEL
Cholesterol: 144 mg/dL (ref 0–200)
HDL: 65.5 mg/dL (ref >39.00)
LDL Cholesterol: 67 mg/dL (ref 0–99)
NonHDL: 78.47
Total CHOL/HDL Ratio: 2
Triglycerides: 59 mg/dL (ref 0.0–149.0)
VLDL: 11.8 mg/dL (ref 0.0–40.0)

## 2018-04-07 LAB — VITAMIN B12: Vitamin B-12: 677 pg/mL (ref 211–911)

## 2018-04-07 LAB — HEMOGLOBIN A1C: Hgb A1c MFr Bld: 5.3 % (ref 4.6–6.5)

## 2018-04-07 LAB — TSH: TSH: 4.05 u[IU]/mL (ref 0.35–4.50)

## 2018-04-07 NOTE — Patient Instructions (Signed)

## 2018-04-07 NOTE — Progress Notes (Signed)
Subjective:  Patient ID: [name] [name], female    DOB: March 19, 1941  Age: 77 y.o. MRN: [id number]  CC: The primary encounter diagnosis was H/O mitral valve replacement. Diagnoses of Prediabetes, Thrombocytopenia ([place]), Iron deficiency anemia, unspecified iron deficiency anemia type, Fatigue, unspecified type, Hematuria, unspecified type, Subacute vaginitis, Pure hypercholesterolemia, Anxiety associated with depression, TIA (transient ischemic attack), and Hematuria, gross were also pertinent to this visit.  HPI [name] [name] presents for follow  Up on multiple issues:  Recent episode of painless gross scant  hematuria that occurred on April 16 and resolved spontaneously .  Etiologic possibilities discussed   Patient  deferred UA  At time of occurrence since it resolved.  But Pelvic exam was recommended, and deferred until today.  She denies any history of flank pain, and has not been sexually active in over 7 years.   Other events since last visit:  Question of seizure disorder vs recurrent TIAs.  Recurrent episodes of transient numbness involving the left foot and left hand .  First episode included transient weakness  Of left side that lasted about 4 minutes.  On Nov 3 went to ER for imaging prior to her OV with Neurologist Manuella Ghazi on Nov 6. CT head unchanged,  CTA  Brain and Neck done . Carotid atherosclerosis noted.  Keppra 500 mg was started Nov 6,  But was  reduced to 250 mg /500   in February due to excessive sedation .  Imaging reviewed, and EEG done, which was normal.  Had another episode on April 9 while sitting in chair quietly sewing,  Had a repeat episode after carbing out on corn bread..  Had another episode lasting 1.5 min after similar carb binge on April 30   She has had recurrent TIAS  (a total of 7 now) with no evidence of CVA  On CT Head (MRI  C/i due to pacemaker ) .  Still drives but only locally.  She is understandable apprehensive and frustrated by these recurrent events , not  only because of the potential harm they could cause but because they threaten her living situation,  at [facility] if discovered.    Lab Results  Component Value Date   HGBA1C 5.3 04/07/2018     Lab Results  Component Value Date   WBC 5.2 04/07/2018   HGB 11.5 (L) 04/07/2018   HCT 34.3 (L) 04/07/2018   MCV 88.0 04/07/2018   PLT 109.0 (L) 04/07/2018   Anemia and thrombocytopenia noted . Patient taking eliquis   Outpatient Medications Prior to Visit  Medication Sig Dispense Refill  . b complex vitamins tablet Take 1 tablet by mouth daily.    . butalbital-acetaminophen-caffeine (FIORICET, ESGIC) 50-325-40 MG tablet TAKE 1 TABLET BY MOUTH AS NEEDED FOR BACK PAIN OR HEADACHE 90 tablet 2  . Cholecalciferol (VITAMIN D) 2000 units CAPS Take 2,000 Units by mouth daily.    . Coenzyme Q10 (CO Q 10) 100 MG CAPS Take 200 mg by mouth 2 (two) times daily.     Marland Kitchen ELIQUIS 5 MG TABS tablet TAKE 1 TABLET BY MOUTH TWICE DAILY 180 tablet 3  . Ferrous Sulfate (IRON) 325 (65 Fe) MG TABS     . folic acid (FOLVITE) 329 MCG tablet     . furosemide (LASIX) 40 MG tablet Take 40 mg by mouth as directed.    . levETIRAcetam (KEPPRA) 250 MG tablet Take 250 mg by mouth every morning.     . levETIRAcetam (KEPPRA) 500 MG tablet  Take 500 mg by mouth daily.     . metoprolol tartrate (LOPRESSOR) 25 MG tablet TAKE ONE-HALF TABLET BY MOUTH TWICE DAILY 90 tablet 2  . montelukast (SINGULAIR) 10 MG tablet Take 10 mg by mouth at bedtime.    . rosuvastatin (CRESTOR) 40 MG tablet TAKE ONE TABLET BY MOUTH EVERY DAY 90 tablet 1  . rosuvastatin (CRESTOR) 40 MG tablet TAKE ONE TABLET BY MOUTH EVERY DAY 90 tablet 1  . amoxicillin (AMOXIL) 500 MG capsule TAKE 4 CAPSULES BY MOUTH 60 MINUTES PRIOR TO DENTAL WORK (Patient not taking: Reported on 04/07/2018) 30 capsule 0   No facility-administered medications prior to visit.     Review of Systems;  Patient denies headache, fevers, malaise, unintentional weight loss, skin rash, eye  pain, sinus congestion and sinus pain, sore throat, dysphagia,  hemoptysis , cough, dyspnea, wheezing, chest pain, palpitations, orthopnea, edema, abdominal pain, nausea, melena, diarrhea, constipation, flank pain, dysuria, hematuria, urinary  Frequency, nocturia, numbness, tingling, seizures,  Focal weakness, Loss of consciousness,  Tremor, insomnia, depression, anxiety, and suicidal ideation.      Objective:  BP 102/60 (BP Location: Left Arm, Patient Position: Sitting, Cuff Size: Normal)   Pulse 86   Temp 97.6 F (36.4 C) (Oral)   Resp 15   Ht 5\' 5"  (1.651 m)   Wt 137 lb 3.2 oz (62.2 kg)   SpO2 98%   BMI 22.83 kg/m   BP Readings from Last 3 Encounters:  04/07/18 102/60  03/12/18 106/62  11/04/17 (!) 90/58    Wt Readings from Last 3 Encounters:  04/07/18 137 lb 3.2 oz (62.2 kg)  03/12/18 138 lb 6.4 oz (62.8 kg)  11/04/17 137 lb 12.8 oz (62.5 kg)   Physical Exam:  General Appearance:    Alert, cooperative, no distress, appears stated age  Head:    Normocephalic, without obvious abnormality, atraumatic  Eyes:    PERRL, conjunctiva/corneas clear, EOM's intact, fundi    benign, both eyes  Ears:    Normal TM's and external ear canals, both ears  Nose:   Nares normal, septum midline, mucosa normal, no drainage    or sinus tenderness  Throat:   Lips, mucosa, and tongue normal; teeth and gums normal  Neck:   Supple, symmetrical, trachea midline, no adenopathy;    thyroid:  no enlargement/tenderness/nodules; no carotid   bruit or JVD  Back:     Symmetric, no curvature, ROM normal, no CVA tenderness  Lungs:     Clear to auscultation bilaterally, respirations unlabored  Chest Wall:    No tenderness or deformity   Heart:    Regular rate and rhythm, S1 and S2 normal, no murmur, rub   or gallop  Breast Exam:    No tenderness, masses, or nipple abnormality  Abdomen:     Soft, non-tender, bowel sounds active all four quadrants,    no masses, no organomegaly  Genitalia:    Pelvic:  cervix normal in appearance, external genitalia atrophic, no adnexal masses or tenderness, no cervical motion tenderness, rectovaginal septum normal, uterus normal size, shape, and consistency and vagina atrophic without erythema or discharge  Extremities:   Extremities normal, atraumatic, no cyanosis or edema  Pulses:   2+ and symmetric all extremities  Skin:   Skin color, texture, turgor normal, no rashes or lesions  Lymph nodes:   Cervical, supraclavicular, and axillary nodes normal  Neurologic:   CNII-XII intact, normal strength, sensation and reflexes    throughout    Lab Results  Component Value Date   HGBA1C 5.3 04/07/2018   HGBA1C 5.3 08/26/2017   HGBA1C 5.7 02/10/2017    Lab Results  Component Value Date   CREATININE 0.79 04/07/2018   CREATININE 0.90 02/26/2018   CREATININE 0.84 10/04/2017    Lab Results  Component Value Date   WBC 5.2 04/07/2018   HGB 11.5 (L) 04/07/2018   HCT 34.3 (L) 04/07/2018   PLT 109.0 (L) 04/07/2018   GLUCOSE 89 04/07/2018   CHOL 144 04/07/2018   TRIG 59.0 04/07/2018   HDL 65.50 04/07/2018   LDLDIRECT 113.0 02/15/2016   LDLCALC 67 04/07/2018   ALT 16 04/07/2018   AST 25 04/07/2018   NA 139 04/07/2018   K 3.9 04/07/2018   CL 103 04/07/2018   CREATININE 0.79 04/07/2018   BUN 25 (H) 04/07/2018   CO2 28 04/07/2018   TSH 4.05 04/07/2018   INR 1.11 05/06/2017   HGBA1C 5.3 04/07/2018    Ct Angio Head W Or Wo Contrast  Result Date: 10/24/2017 CLINICAL DATA:  77 year old female with multiple episodes of extremity numbness in the past year. Possible TIA. EXAM: CT ANGIOGRAPHY HEAD AND NECK TECHNIQUE: Multidetector CT imaging of the head and neck was performed using the standard protocol during bolus administration of intravenous contrast. Multiplanar CT image reconstructions and MIPs were obtained to evaluate the vascular anatomy. Carotid stenosis measurements (when applicable) are obtained utilizing NASCET criteria, using the distal internal  carotid diameter as the denominator. CONTRAST:  57mL ISOVUE-370 IOPAMIDOL (ISOVUE-370) INJECTION 76% COMPARISON:  CTA head and neck 05/03/2016. Head CT 10/04/2017, and earlier. FINDINGS: CT HEAD Brain: Cerebral volume remains normal for age. Stable gray-white matter differentiation throughout the brain. No midline shift, ventriculomegaly, mass effect, evidence of mass lesion, intracranial hemorrhage or evidence of cortically based acute infarction. No cortical encephalomalacia. Calvarium and skull base: Osteopenia. Stable visualized osseous structures. Paranasal sinuses: Visualized paranasal sinuses and mastoids are stable and well pneumatized. Orbits: No acute orbit or scalp soft tissue findings. CTA NECK Skeleton: Chronic mandible ORIF. Chronic TMJ degeneration. Cervicothoracic scoliosis. Osteopenia. No acute osseous abnormality identified. Upper chest: Chronic left chest cardiac pacemaker type device. Upper lung mosaic attenuation. No superior mediastinal lymphadenopathy. Other neck: Stable and negative. Aortic arch: Stable mild aortic arch calcified plaque. Three vessel arch configuration with no great vessel origin stenosis. Right carotid system: Mildly tortuous right CCA is stable. Negative right carotid bifurcation. Mild atherosclerosis and irregularity of the mid cervical right ICA without stenosis. Left carotid system: Mildly tortuous and stable left CCA. Mild calcified plaque at the left carotid bifurcation which is widely patent. Tortuosity and mild beaded irregularity of the left ICA distal to the bulb is stable (series 14, image 23). Otherwise mildly dolichoectatic cervical left ICA. Vertebral arteries: Proximal right subclavian artery calcified plaque is stable without significant stenosis. Normal right vertebral artery origin. Dominant and dolichoectatic right vertebral artery is patent to the skullbase without stenosis. Mild proximal left subclavian artery plaque without stenosis. Calcified plaque  near the left vertebral artery origin, but no left vertebral origin stenosis. Tortuous and somewhat dolichoectatic left V1 segment. Non dominant but fairly normal left vertebral artery caliber elsewhere in the neck. Mild V3 tortuosity and irregularity. No left vertebral artery stenosis to the skullbase. CTA HEAD Posterior circulation: Dominant distal right vertebral artery. Node distal vertebral plaque or stenosis. Mild tortuosity. Normal PICA origins. Normal vertebrobasilar junction and proximal basilar artery. Distal basilar artery irregularity and mild to moderate stenosis has increased since 2017. See series 8, image 110.  Still the basilar tip is patent and mildly ectatic. SCA and PCA origins are patent without stenosis. Tortuous left P1 segment. Left posterior communicating artery is present. There is mild bilateral P2 segment irregularity greater on the left. Bilateral PCA branches are otherwise within normal limits. Anterior circulation: Patent and somewhat dolichoectatic bilateral ICA siphons with calcified plaque but no significant stenosis. Ophthalmic and posterior communicating artery origins are within normal limits. Patent carotid termini. Normal MCA and ACA origins. Tortuous A1 segments. Ectatic anterior communicating artery without discrete aneurysm is stable. Bilateral ACA branches are normal aside from tortuosity. Left MCA M1 segment, left MCA trifurcation, and left MCA branches are stable and within normal limits. Right MCA M1 segment, trifurcation, and right MCA branches are stable and within normal limits. Venous sinuses: Patent. Anatomic variants: Dominant right vertebral artery. Delayed phase: No abnormal enhancement identified. Review of the MIP images confirms the above findings IMPRESSION: 1. 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 The basilar tip remains stable and patent. 2. Otherwise stable CTA head and neck since 2017. 3. 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 Minimal cervical carotid atherosclerosis. 4. Superimposed areas of dolichoectasia of the bilateral carotid, right vertebral artery, basilar tip and anterior communicating artery. No discrete aneurysm. 5. Bilateral ICA siphon calcified plaque without significant stenosis. 6. Stable and normal for age CT appearance of the brain. Electronically Signed   By: Genevie Ann M.D.   On: 10/24/2017 10:16   Ct Angio Neck W Or Wo Contrast  Result Date: 10/24/2017 CLINICAL DATA:  77 year old female with multiple episodes of extremity numbness in the past year. Possible TIA. EXAM: CT ANGIOGRAPHY HEAD AND NECK TECHNIQUE: Multidetector CT imaging of the head and neck was performed using the standard protocol during bolus administration of intravenous contrast. Multiplanar CT image reconstructions and MIPs were obtained to evaluate the vascular anatomy. Carotid stenosis measurements (when applicable) are obtained utilizing NASCET criteria, using the distal internal carotid diameter as the denominator. CONTRAST:  3mL ISOVUE-370 IOPAMIDOL (ISOVUE-370) INJECTION 76% COMPARISON:  CTA head and neck 05/03/2016. Head CT 10/04/2017, and earlier. FINDINGS: CT HEAD Brain: Cerebral volume remains normal for age. Stable gray-white matter differentiation throughout the brain. No midline shift, ventriculomegaly, mass effect, evidence of mass lesion, intracranial hemorrhage or evidence of cortically based acute infarction. No cortical encephalomalacia. Calvarium and skull base: Osteopenia. Stable visualized osseous structures. Paranasal sinuses: Visualized paranasal sinuses and mastoids are stable and well pneumatized. Orbits: No acute orbit or scalp soft tissue findings. CTA NECK Skeleton: Chronic mandible ORIF. Chronic TMJ degeneration. Cervicothoracic scoliosis. Osteopenia. No acute osseous abnormality identified. Upper chest: Chronic left chest cardiac pacemaker type device. Upper lung  mosaic attenuation. No superior mediastinal lymphadenopathy. Other neck: Stable and negative. Aortic arch: Stable mild aortic arch calcified plaque. Three vessel arch configuration with no great vessel origin stenosis. Right carotid system: Mildly tortuous right CCA is stable. Negative right carotid bifurcation. Mild atherosclerosis and irregularity of the mid cervical right ICA without stenosis. Left carotid system: Mildly tortuous and stable left CCA. Mild calcified plaque at the left carotid bifurcation which is widely patent. Tortuosity and mild beaded irregularity of the left ICA distal to the bulb is stable (series 14, image 23). Otherwise mildly dolichoectatic cervical left ICA. Vertebral arteries: Proximal right subclavian artery calcified plaque is stable without significant stenosis. Normal right vertebral artery origin. Dominant and dolichoectatic right vertebral artery is patent to the skullbase without stenosis. Mild proximal left subclavian artery plaque  without stenosis. Calcified plaque near the left vertebral artery origin, but no left vertebral origin stenosis. Tortuous and somewhat dolichoectatic left V1 segment. Non dominant but fairly normal left vertebral artery caliber elsewhere in the neck. Mild V3 tortuosity and irregularity. No left vertebral artery stenosis to the skullbase. CTA HEAD Posterior circulation: Dominant distal right vertebral artery. Node distal vertebral plaque or stenosis. Mild tortuosity. Normal PICA origins. Normal vertebrobasilar junction and proximal basilar artery. Distal basilar artery irregularity and mild to moderate stenosis has increased since 2017. See series 8, image 110. Still the basilar tip is patent and mildly ectatic. SCA and PCA origins are patent without stenosis. Tortuous left P1 segment. Left posterior communicating artery is present. There is mild bilateral P2 segment irregularity greater on the left. Bilateral PCA branches are otherwise within normal  limits. Anterior circulation: Patent and somewhat dolichoectatic bilateral ICA siphons with calcified plaque but no significant stenosis. Ophthalmic and posterior communicating artery origins are within normal limits. Patent carotid termini. Normal MCA and ACA origins. Tortuous A1 segments. Ectatic anterior communicating artery without discrete aneurysm is stable. Bilateral ACA branches are normal aside from tortuosity. Left MCA M1 segment, left MCA trifurcation, and left MCA branches are stable and within normal limits. Right MCA M1 segment, trifurcation, and right MCA branches are stable and within normal limits. Venous sinuses: Patent. Anatomic variants: Dominant right vertebral artery. Delayed phase: No abnormal enhancement identified. Review of the MIP images confirms the above findings IMPRESSION: 1. 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 The basilar tip remains stable and patent. 2. Otherwise stable CTA head and neck since 2017. 3. 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 Minimal cervical carotid atherosclerosis. 4. Superimposed areas of dolichoectasia of the bilateral carotid, right vertebral artery, basilar tip and anterior communicating artery. No discrete aneurysm. 5. Bilateral ICA siphon calcified plaque without significant stenosis. 6. Stable and normal for age CT appearance of the brain. Electronically Signed   By: Genevie Ann M.D.   On: 10/24/2017 10:16    Assessment & Plan:   Problem List Items Addressed This Visit    Hyperlipidemia (Chronic)   Relevant Orders   Lipid panel (Completed)   H/O mitral valve replacement - Primary   TIA (transient ischemic attack)    She is maximally medication against recurrent with Eliquis, and rosuvastatin .  Since tow of the episodes occurred after "carbing out," need to consider if the episodes  are precipitated by a drop in BS. However, her a1c is completely normal.    Lab  Results  Component Value Date   HGBA1C 5.3 04/07/2018         Thrombocytopenia ([facility])    Chronic No significant change with suspension of crestor  Lab Results  Component Value Date   WBC 5.2 04/07/2018   HGB 11.5 (L) 04/07/2018   HCT 34.3 (L) 04/07/2018   MCV 88.0 04/07/2018   PLT 109.0 (L) 04/07/2018         Relevant Orders   Vitamin B12 (Completed)   CBC with Differential/Platelet (Completed)   Prediabetes    A1c has normalized with careful attention to diet.  Lab Results  Component Value Date   HGBA1C 5.3 04/07/2018         Relevant Orders   Hemoglobin A1c (Completed)   Hematuria, gross    Based on pelvic exam and UA today,  Likely due to atrophic vaginitis      Anxiety associated with depression (Chronic)  Aggravated by her recurrent unpredictable neurologic events. She does not want to consider pharmacotherapy      Anemia    Improving,  Iron b12 and thyroid are normal and hgb is almost back to normal  Lab Results  Component Value Date   WBC 5.2 04/07/2018   HGB 11.5 (L) 04/07/2018   HCT 34.3 (L) 04/07/2018   MCV 88.0 04/07/2018   PLT 109.0 (L) 04/07/2018   Lab Results  Component Value Date   IRON 62 04/07/2018   TIBC 353 04/07/2018   FERRITIN 22 04/07/2018   Lab Results  Component Value Date   VITAMINB12 677 04/07/2018          Relevant Orders   Comprehensive metabolic panel (Completed)   Iron, TIBC and Ferritin Panel (Completed)   CBC with Differential/Platelet (Completed)    Other Visit Diagnoses    Fatigue, unspecified type       Relevant Orders   TSH (Completed)   Hematuria, unspecified type       Relevant Orders   Urinalysis, Routine w reflex microscopic (Completed)   Cytology - PAP (Completed)   Subacute vaginitis       Relevant Orders   Cytology - PAP (Completed)      I am having [name] [name] maintain her Vitamin D, Co Q 10, b complex vitamins, furosemide, amoxicillin, butalbital-acetaminophen-caffeine, ELIQUIS,  metoprolol tartrate, levETIRAcetam, rosuvastatin, Iron, folic acid, rosuvastatin, levETIRAcetam, and montelukast.  No orders of the defined types were placed in this encounter.   There are no discontinued medications.  Follow-up: Return in about 1 month (around 05/05/2018).   [name], MD

## 2018-04-08 LAB — URINALYSIS, ROUTINE W REFLEX MICROSCOPIC
Bilirubin Urine: NEGATIVE
Hgb urine dipstick: NEGATIVE
Ketones, ur: NEGATIVE
Leukocytes, UA: NEGATIVE
Nitrite: NEGATIVE
RBC / HPF: NONE SEEN (ref 0–?)
Specific Gravity, Urine: 1.015 (ref 1.000–1.030)
Total Protein, Urine: NEGATIVE
Urine Glucose: NEGATIVE
Urobilinogen, UA: 0.2 (ref 0.0–1.0)
pH: 5 (ref 5.0–8.0)

## 2018-04-08 LAB — IRON,TIBC AND FERRITIN PANEL
%SAT: 18 % (calc) (ref 11–50)
Ferritin: 22 ng/mL (ref 20–288)
Iron: 62 ug/dL (ref 45–160)
TIBC: 353 mcg/dL (calc) (ref 250–450)

## 2018-04-09 ENCOUNTER — Telehealth: Payer: Self-pay | Admitting: Internal Medicine

## 2018-04-09 ENCOUNTER — Encounter: Payer: Self-pay | Admitting: Internal Medicine

## 2018-04-09 DIAGNOSIS — R310 Gross hematuria: Secondary | ICD-10-CM

## 2018-04-09 HISTORY — DX: Gross hematuria: R31.0

## 2018-04-09 LAB — CYTOLOGY - PAP
Diagnosis: NEGATIVE
HPV: NOT DETECTED

## 2018-04-09 NOTE — Assessment & Plan Note (Signed)
She is maximally medication against recurrent with Eliquis, and rosuvastatin .  Since tow of the episodes occurred after "carbing out," need to consider if the episodes  are precipitated by a drop in BS. However, her a1c is completely normal.    Lab Results  Component Value Date   HGBA1C 5.3 04/07/2018

## 2018-04-09 NOTE — Assessment & Plan Note (Signed)
Chronic No significant change with suspension of crestor  Lab Results  Component Value Date   WBC 5.2 04/07/2018   HGB 11.5 (L) 04/07/2018   HCT 34.3 (L) 04/07/2018   MCV 88.0 04/07/2018   PLT 109.0 (L) 04/07/2018

## 2018-04-09 NOTE — Assessment & Plan Note (Signed)
Improving,  Iron b12 and thyroid are normal and hgb is almost back to normal  Lab Results  Component Value Date   WBC 5.2 04/07/2018   HGB 11.5 (L) 04/07/2018   HCT 34.3 (L) 04/07/2018   MCV 88.0 04/07/2018   PLT 109.0 (L) 04/07/2018   Lab Results  Component Value Date   IRON 62 04/07/2018   TIBC 353 04/07/2018   FERRITIN 22 04/07/2018   Lab Results  Component Value Date   VITAMINB12 677 04/07/2018

## 2018-04-09 NOTE — Assessment & Plan Note (Signed)
Aggravated by her recurrent unpredictable neurologic events. She does not want to consider pharmacotherapy

## 2018-04-09 NOTE — Assessment & Plan Note (Signed)
A1c has normalized with careful attention to diet.  Lab Results  Component Value Date   HGBA1C 5.3 04/07/2018

## 2018-04-09 NOTE — Telephone Encounter (Signed)
Copied from [place] [id number]. Topic: Complaint - Billing/Coding >> Apr 09, 2018  2:22 PM Boyd Kerbs wrote: Patient name/MRN/Acct #: [id number] DOS: 03/12/18 Details of complaint: Was not a year from previous [place] How would the patient like to see it resolved? She is wanting to see about this as Medicare sent letter to set appt. Up and she did and now saying they won't pay On a scale of 1-10, how was your experience? 3  does not feel she needs this What would it take to bring it to a 10? Not sure what she should do Occurred in [place] and saying [place].  Will automatically be routed to [place] [facility].

## 2018-04-09 NOTE — Telephone Encounter (Signed)
Please advise 

## 2018-04-09 NOTE — Assessment & Plan Note (Signed)
Based on pelvic exam and UA today,  Likely due to atrophic vaginitis

## 2018-04-10 ENCOUNTER — Other Ambulatory Visit: Payer: Self-pay | Admitting: Internal Medicine

## 2018-04-10 MED ORDER — ESTROGENS, CONJUGATED 0.625 MG/GM VA CREA: 1.0000 | TOPICAL_CREAM | Freq: Every day | VAGINAL | 12 refills | Status: DC

## 2018-04-10 NOTE — Telephone Encounter (Signed)
The patient's information has been sent to charge correction for review.

## 2018-04-24 ENCOUNTER — Encounter: Payer: Self-pay | Admitting: Internal Medicine

## 2018-04-30 ENCOUNTER — Ambulatory Visit: Payer: Medicare Other | Admitting: Internal Medicine

## 2018-05-05 ENCOUNTER — Other Ambulatory Visit: Payer: Self-pay | Admitting: Nurse Practitioner

## 2018-05-05 ENCOUNTER — Ambulatory Visit: Payer: Medicare Other | Admitting: Internal Medicine

## 2018-05-05 DIAGNOSIS — R2990 Unspecified symptoms and signs involving the nervous system: Secondary | ICD-10-CM

## 2018-05-12 ENCOUNTER — Encounter: Payer: Self-pay | Admitting: Internal Medicine

## 2018-05-14 ENCOUNTER — Ambulatory Visit (INDEPENDENT_AMBULATORY_CARE_PROVIDER_SITE_OTHER): Payer: Medicare Other | Admitting: Internal Medicine

## 2018-05-14 ENCOUNTER — Encounter: Payer: Self-pay | Admitting: Internal Medicine

## 2018-05-14 ENCOUNTER — Ambulatory Visit: Payer: Medicare Other | Admitting: Internal Medicine

## 2018-05-14 DIAGNOSIS — G43409 Hemiplegic migraine, not intractable, without status migrainosus: Secondary | ICD-10-CM | POA: Diagnosis not present

## 2018-05-14 DIAGNOSIS — R569 Unspecified convulsions: Secondary | ICD-10-CM | POA: Insufficient documentation

## 2018-05-14 MED ORDER — BUTALBITAL-APAP-CAFFEINE 50-325-40 MG PO TABS
ORAL_TABLET | ORAL | 5 refills | Status: DC
Start: 2018-05-14 — End: 2018-07-23

## 2018-05-14 NOTE — Patient Instructions (Addendum)
I HAVE refilled the Fioricet for 90/month to manage your migraines and back pain    I agree with your plan to resume a daily walk ,  An earlier bedtime  etc   If your blood pressure improves (gets higher) once you stop the Keppra  We can try increasing the metoprolol at bedtime only

## 2018-05-14 NOTE — Assessment & Plan Note (Signed)
Occurring 8 times over the last year,  Aggravated by Keppra.  Diagnosed by Neurology.  Keppra being weaned,  Will manage with Fioricet

## 2018-05-14 NOTE — Progress Notes (Signed)
Subjective:  Patient ID: [name] [name], female    DOB: 1941/09/03  Age: 77 y.o. MRN: [id number]  CC: The encounter diagnosis was Hemiplegic migraine without status migrainosus, not intractable.  HPI [name] [name] presents for   FOLLOW UP ON RECURRENT neurologic episodes (TIA vs  seizures)  which have been occurring for the past year and have now been diagnosed as hemiplegic migraines without headache THAT HAVE BEEN AGGRAVATED BY KEPPRA . Marland Kitchen  She has a total of 8, 6 of which reminded her of her past migraines .  Her history of migraines started at age 28.  She has noted some of the same triggers  And provides a detailed list of events leading up to each of the last 4 episodes.   Her last episode occurred on May 19  While standing at the bed to fold laundry.  left leg became very weak,  Too weak to move  .  The symptoms lasted 14 minutes before resolving spontaneously . Left hand went numb but not weak   Has not been Using azelastine for allergic rhinitis due to concern for interaction with Keppra.  Has resumed it based on advice from [place]   Has been weaning gradually off of the [place] , which is improving  her daytime somnolence . The wean is by 250 mg every 2 weeks   Plans to resume exercise  Starting with walking around [facility] . Plans to go to bed earlier,  Avoid certain situations known to trigger migraines.  And use fioricet  Preemptively  Less constipation with lower keppra dose, and using a priobiotic again      Outpatient Medications Prior to Visit  Medication Sig Dispense Refill  . b complex vitamins tablet Take 1 tablet by mouth daily.    . Cholecalciferol (VITAMIN D) 2000 units CAPS Take 2,000 Units by mouth daily.    . Coenzyme Q10 (CO Q 10) 100 MG CAPS Take 200 mg by mouth 2 (two) times daily.     Marland Kitchen conjugated estrogens (PREMARIN) vaginal cream Place 1 Applicatorful vaginally daily. For 2 weeks,  Then two nights per week thereafter 42.5 g 12  . ELIQUIS 5 MG TABS tablet  TAKE 1 TABLET BY MOUTH TWICE DAILY 180 tablet 3  . Ferrous Sulfate (IRON) 325 (65 Fe) MG TABS     . folic acid (FOLVITE) 938 MCG tablet     . furosemide (LASIX) 40 MG tablet Take 40 mg by mouth as directed.    . levETIRAcetam (KEPPRA) 250 MG tablet Take 250 mg by mouth 2 (two) times daily.     . metoprolol tartrate (LOPRESSOR) 25 MG tablet TAKE ONE-HALF TABLET BY MOUTH TWICE DAILY 90 tablet 2  . montelukast (SINGULAIR) 10 MG tablet Take 10 mg by mouth at bedtime.    . rosuvastatin (CRESTOR) 40 MG tablet TAKE ONE TABLET BY MOUTH EVERY DAY 90 tablet 1  . butalbital-acetaminophen-caffeine (FIORICET, ESGIC) 50-325-40 MG tablet TAKE 1 TABLET BY MOUTH AS NEEDED FOR BACK PAIN OR HEADACHE 90 tablet 2  . amoxicillin (AMOXIL) 500 MG capsule TAKE 4 CAPSULES BY MOUTH 60 MINUTES PRIOR TO DENTAL WORK (Patient not taking: Reported on 04/07/2018) 30 capsule 0  . levETIRAcetam (KEPPRA) 500 MG tablet Take 500 mg by mouth daily.     . rosuvastatin (CRESTOR) 40 MG tablet TAKE ONE TABLET BY MOUTH EVERY DAY (Patient not taking: Reported on 05/14/2018) 90 tablet 1   No facility-administered medications prior to visit.  Review of Systems;  Patient denies headache, fevers, malaise, unintentional weight loss, skin rash, eye pain, sinus congestion and sinus pain, sore throat, dysphagia,  hemoptysis , cough, dyspnea, wheezing, chest pain, palpitations, orthopnea, edema, abdominal pain, nausea, melena, diarrhea, constipation, flank pain, dysuria, hematuria, urinary  Frequency, nocturia, numbness, tingling, seizures,  Focal weakness, Loss of consciousness,  Tremor, insomnia, depression, anxiety, and suicidal ideation.      Objective:  BP (!) 92/56 (BP Location: Left Arm, Patient Position: Sitting, Cuff Size: Normal)   Pulse 91   Temp 97.8 F (36.6 C) (Oral)   Resp 16   Ht 5\' 5"  (1.651 m)   Wt 138 lb 6.4 oz (62.8 kg)   SpO2 98%   BMI 23.03 kg/m   BP Readings from Last 3 Encounters:  05/14/18 (!) 92/56  04/07/18  102/60  03/12/18 106/62    Wt Readings from Last 3 Encounters:  05/14/18 138 lb 6.4 oz (62.8 kg)  04/07/18 137 lb 3.2 oz (62.2 kg)  03/12/18 138 lb 6.4 oz (62.8 kg)    General appearance: alert, cooperative and appears stated age Ears: normal TM's and external ear canals both ears Throat: lips, mucosa, and tongue normal; teeth and gums normal Neck: no adenopathy, no carotid bruit, supple, symmetrical, trachea midline and thyroid not enlarged, symmetric, no tenderness/mass/nodules Lungs: clear to auscultation bilaterally Heart: regular rate and rhythm, S1, S2 normal, no murmur, click, rub or gallop Pulses: 2+ and symmetric Skin: Skin color, texture, turgor normal. No rashes or lesions  Neuro: CNs 2-12 intact. DTRs 2+/4 in biceps, brachioradialis, patellars and achilles. Muscle strength 5/5 in upper and lower exremities. Fine resting tremor bilaterally both hands cerebellar function normal. Romberg negative.  No pronator drift.   Gait normal.   Lab Results  Component Value Date   HGBA1C 5.3 04/07/2018   HGBA1C 5.3 08/26/2017   HGBA1C 5.7 02/10/2017    Lab Results  Component Value Date   CREATININE 0.79 04/07/2018   CREATININE 0.90 02/26/2018   CREATININE 0.84 10/04/2017    Lab Results  Component Value Date   WBC 5.2 04/07/2018   HGB 11.5 (L) 04/07/2018   HCT 34.3 (L) 04/07/2018   PLT 109.0 (L) 04/07/2018   GLUCOSE 89 04/07/2018   CHOL 144 04/07/2018   TRIG 59.0 04/07/2018   HDL 65.50 04/07/2018   LDLDIRECT 113.0 02/15/2016   LDLCALC 67 04/07/2018   ALT 16 04/07/2018   AST 25 04/07/2018   NA 139 04/07/2018   K 3.9 04/07/2018   CL 103 04/07/2018   CREATININE 0.79 04/07/2018   BUN 25 (H) 04/07/2018   CO2 28 04/07/2018   TSH 4.05 04/07/2018   INR 1.11 05/06/2017   HGBA1C 5.3 04/07/2018    No results found.  Assessment & Plan:   Problem List Items Addressed This Visit    Hemiplegic migraine without status migrainosus, not intractable    Occurring 8 times  over the last year,  Aggravated by Keppra.  Diagnosed by Neurology.  Keppra being weaned,  Will manage with Fioricet       Relevant Medications   butalbital-acetaminophen-caffeine (FIORICET, ESGIC) 50-325-40 MG tablet     A total of 25 minutes of face to face time was spent with patient more than half of which was spent in counselling about the above mentioned conditions  and coordination of care  I have changed [name] [name]'s butalbital-acetaminophen-caffeine. I am also having her maintain her Vitamin D, Co Q 10, b complex vitamins, furosemide, amoxicillin, ELIQUIS, metoprolol  tartrate, rosuvastatin, Iron, folic acid, levETIRAcetam, montelukast, and conjugated estrogens.  Meds ordered this encounter  Medications  . butalbital-acetaminophen-caffeine (FIORICET, ESGIC) 50-325-40 MG tablet    Sig: TAKE 1 TABLET BY MOUTH AS NEEDED FOR BACK PAIN OR MIGRAINE HEADACHE    Dispense:  90 tablet    Refill:  5    Medications Discontinued During This Encounter  Medication Reason  . levETIRAcetam (KEPPRA) 500 MG tablet Change in therapy  . rosuvastatin (CRESTOR) 40 MG tablet Duplicate  . butalbital-acetaminophen-caffeine (FIORICET, ESGIC) 50-325-40 MG tablet Reorder    Follow-up: Return in about 6 months (around 11/13/2018) for CPE.   [name], MD

## 2018-05-17 ENCOUNTER — Encounter: Payer: Self-pay | Admitting: Internal Medicine

## 2018-05-20 ENCOUNTER — Other Ambulatory Visit: Payer: Self-pay | Admitting: Cardiovascular Disease

## 2018-05-22 ENCOUNTER — Ambulatory Visit: Payer: Medicare Other | Admitting: Cardiology

## 2018-05-28 ENCOUNTER — Ambulatory Visit: Payer: Medicare Other

## 2018-06-02 ENCOUNTER — Ambulatory Visit (INDEPENDENT_AMBULATORY_CARE_PROVIDER_SITE_OTHER): Payer: Medicare Other | Admitting: *Deleted

## 2018-06-02 DIAGNOSIS — I442 Atrioventricular block, complete: Secondary | ICD-10-CM

## 2018-06-02 NOTE — Progress Notes (Signed)
Remote pacemaker transmission.   

## 2018-06-09 ENCOUNTER — Ambulatory Visit (INDEPENDENT_AMBULATORY_CARE_PROVIDER_SITE_OTHER): Payer: Medicare Other | Admitting: Internal Medicine

## 2018-06-09 ENCOUNTER — Encounter: Payer: Self-pay | Admitting: Internal Medicine

## 2018-06-09 VITALS — BP 98/60 | HR 83 | Ht 65.0 in | Wt 137.0 lb

## 2018-06-09 DIAGNOSIS — Z952 Presence of prosthetic heart valve: Secondary | ICD-10-CM

## 2018-06-09 DIAGNOSIS — Z9889 Other specified postprocedural states: Secondary | ICD-10-CM

## 2018-06-09 DIAGNOSIS — Z8679 Personal history of other diseases of the circulatory system: Secondary | ICD-10-CM

## 2018-06-09 DIAGNOSIS — I495 Sick sinus syndrome: Secondary | ICD-10-CM

## 2018-06-09 DIAGNOSIS — Z950 Presence of cardiac pacemaker: Secondary | ICD-10-CM | POA: Diagnosis not present

## 2018-06-09 LAB — CUP PACEART INCLINIC DEVICE CHECK
Battery Impedance: 232 Ohm
Battery Remaining Longevity: 99 mo
Battery Voltage: 2.79 V
Brady Statistic AP VP Percent: 97 %
Brady Statistic AP VS Percent: 0 %
Brady Statistic AS VP Percent: 3 %
Brady Statistic AS VS Percent: 0 %
Date Time Interrogation Session: 20190709135559
Implantable Lead Implant Date: 20161205
Implantable Lead Implant Date: 20170321
Implantable Lead Location: 753859
Implantable Lead Location: 753860
Implantable Lead Model: 5076
Implantable Lead Model: 5076
Implantable Pulse Generator Implant Date: 20161205
Lead Channel Impedance Value: 471 Ohm
Lead Channel Impedance Value: 781 Ohm
Lead Channel Pacing Threshold Amplitude: 0.5 V
Lead Channel Pacing Threshold Amplitude: 0.5 V
Lead Channel Pacing Threshold Pulse Width: 0.4 ms
Lead Channel Pacing Threshold Pulse Width: 0.4 ms
Lead Channel Sensing Intrinsic Amplitude: 1 mV
Lead Channel Sensing Intrinsic Amplitude: 5.6 mV
Lead Channel Setting Pacing Amplitude: 2 V
Lead Channel Setting Pacing Amplitude: 2.5 V
Lead Channel Setting Pacing Pulse Width: 0.4 ms
Lead Channel Setting Sensing Sensitivity: 4 mV

## 2018-06-09 NOTE — Patient Instructions (Signed)
Medication Instructions:  Your physician recommends that you continue on your current medications as directed. Please refer to the Current Medication list given to you today.  Labwork: None ordered.  Testing/Procedures: None ordered.  Follow-Up: Your physician wants you to follow-up in: one year with Dr. Lovena Le.   You will receive a reminder letter in the mail two months in advance. If you don't receive a letter, please call our office to schedule the follow-up appointment.  Remote monitoring is used to monitor your Pacemaker from home. This monitoring reduces the number of office visits required to check your device to one time per year. It allows [place] to keep an eye on the functioning of your device to ensure it is working properly. You are scheduled for a device check from home on 09/01/2018. You may send your transmission at any time that day. If you have a wireless device, the transmission will be sent automatically. After your physician reviews your transmission, you will receive a postcard with your next transmission date.  Any Other Special Instructions Will Be Listed Below (If Applicable).  If you need a refill on your cardiac medications before your next appointment, please call your pharmacy.

## 2018-06-09 NOTE — Progress Notes (Signed)
HPI Ms. Strength returns today for PPM followup. 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 She underwent PPM insertion followed by revision over 2 years ago. She has known atrial fib and is on systemic anti-coagulation.  she has been diagnosed with unusual migraines. She was initially thought to have seizures.  Allergies  Allergen Reactions  . Epinephrine Palpitations  . Meloxicam Other (See Comments)    Severe reflux  . Codeine Nausea And Vomiting    migraine  . Doxycycline Itching and Swelling    Facial  . Hydrocodone Nausea And Vomiting    MIGRAINE  . Hydromorphone Nausea And Vomiting    migraine  . Oxycodone Nausea And Vomiting    Severe migraine  . Propofol Other (See Comments)     Current Outpatient Medications  Medication Sig Dispense Refill  . b complex vitamins tablet Take 1 tablet by mouth daily.    . butalbital-acetaminophen-caffeine (FIORICET, ESGIC) 50-325-40 MG tablet TAKE 1 TABLET BY MOUTH AS NEEDED FOR BACK PAIN OR MIGRAINE HEADACHE 90 tablet 5  . Cholecalciferol (VITAMIN D) 2000 units CAPS Take 2,000 Units by mouth daily.    . Coenzyme Q10 (CO Q 10) 100 MG CAPS Take 200 mg by mouth 2 (two) times daily.     Marland Kitchen conjugated estrogens (PREMARIN) vaginal cream Place 1 Applicatorful vaginally daily. For 2 weeks,  Then two nights per week thereafter 42.5 g 12  . ELIQUIS 5 MG TABS tablet TAKE ONE TABLET BY MOUTH TWICE DAILY 180 tablet 1  . Ferrous Sulfate (IRON) 325 (65 Fe) MG TABS     . folic acid (FOLVITE) 789 MCG tablet     . furosemide (LASIX) 40 MG tablet Take 40 mg by mouth as directed.    . metoprolol tartrate (LOPRESSOR) 25 MG tablet TAKE ONE-HALF TABLET BY MOUTH TWICE DAILY 90 tablet 2  . montelukast (SINGULAIR) 10 MG tablet Take 10 mg by mouth at bedtime.    . rosuvastatin (CRESTOR) 40 MG tablet TAKE ONE TABLET BY MOUTH EVERY DAY 90 tablet 1   No current  facility-administered medications for this visit.      Past Medical History:  Diagnosis Date  . Acute on chronic diastolic (congestive) heart failure ([place])   . Allergy    See list  . Anemia 2018  . Anxiety    Occasionally take Xanax for sleep  . Anxiety associated with depression    Prn alprazolam   . Arthritis    Hands, Back  . Atrial fibrillation, persistent ([place])    DCCV 08/22/2015  . Bradycardia post-op bradycardia, pacer dependent   MDT PPM 11/06/15, Dr. Lovena Le  . Cancer ([place]) 1991   Basal cell on nose  . Cataract    Left eye  . Diverticulosis   . Heart murmur   . Hemorrhoid   . Hepatic cyst    innumerable  . 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 Hyperlipidemia   . IBS (irritable bowel syndrome)   . 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 Migraine   . MVP (mitral valve prolapse)   . Nodule of right lung   . Osteoporosis of forearm   . RBBB   . Restrictive lung disease    Mild on PFT & likely cardiac in etiology   . S/P Minimally invasive maze operation for atrial fibrillation 10/31/2015   Complete bilateral atrial lesion set using cryothermy and  bipolar radiofrequency ablation with clipping of LA appendage via right mini thoracotomy approach  . S/P minimally invasive mitral valve replacement with bioprosthetic valve 10/31/2015   33 mm [facility] Mitral bovine bioprosthetic tissue valve placed via right mini thoracotomy approach  . Severe mitral regurgitation   . SVT (supraventricular tachycardia) ([place])   . Thoracic aorta atherosclerosis ([place])   . TIA (transient ischemic attack)     ROS:   All systems reviewed and negative except as noted in the HPI.   Past Surgical History:  Procedure Laterality Date  . BREAST BIOPSY Right Late 80s   Negative  . CARDIAC CATHETERIZATION N/A 10/18/2015   Procedure: Right/Left Heart Cath and Coronary Angiography;  Surgeon: Sherren Mocha, MD;  Location: [facility];  Service: Cardiovascular;  Laterality:  N/A;  . CARDIOVERSION N/A 08/22/2015   Procedure: CARDIOVERSION;  Surgeon: Thayer Headings, MD;  Location: [facility];  Service: Cardiovascular;  Laterality: N/A;  . COLONOSCOPY  2003  . EP IMPLANTABLE DEVICE N/A 11/06/2015   Procedure: Pacemaker Implant;  Surgeon: Evans Lance, MD;  Location: [place] [facility];  Service: Cardiovascular;  Laterality: N/A;  . EP IMPLANTABLE DEVICE N/A 02/20/2016   Procedure: Lead Extraction;  Surgeon: Evans Lance, MD;  Location: [facility];  Service: Cardiovascular;  Laterality: N/A;  . EYE SURGERY Right 2013  . FOOT SURGERY    . MANDIBLE FRACTURE SURGERY  03-26-13  . MINIMALLY INVASIVE MAZE PROCEDURE N/A 10/31/2015   Procedure: MINIMALLY INVASIVE MAZE PROCEDURE;  Surgeon: Rexene Alberts, MD;  Location: [facility];  Service: Open Heart Surgery;  Laterality: N/A;  . MITRAL VALVE REPLACEMENT Right 10/31/2015   Procedure: MINIMALLY INVASIVE MITRAL VALVE (MV) REPLACEMENT;  Surgeon: Rexene Alberts, MD;  Location: [facility];  Service: Open Heart Surgery;  Laterality: Right;  . PACEMAKER LEAD REMOVAL  02/20/2016  . TEE WITH CARDIOVERSION    . TEE WITHOUT CARDIOVERSION N/A 08/22/2015   Procedure: TRANSESOPHAGEAL ECHOCARDIOGRAM (TEE);  Surgeon: Thayer Headings, MD;  Location: [facility];  Service: Cardiovascular;  Laterality: N/A;  . TEE WITHOUT CARDIOVERSION N/A 10/31/2015   Procedure: TRANSESOPHAGEAL ECHOCARDIOGRAM (TEE);  Surgeon: Rexene Alberts, MD;  Location: [facility];  Service: Open Heart Surgery;  Laterality: N/A;  . TUBAL LIGATION    . VARICOSE VEIN SURGERY Right      Family History  Problem Relation Age of Onset  . Hypertension Mother   . Arrhythmia Mother   . Heart failure Mother   . Arrhythmia Brother   . Stroke Brother 50       cerebral hemorrhage, nonsmoker, no HTN  . Prostate cancer Brother   . Stroke Father        from an aneurysm  . Stroke Maternal Aunt 83       cerebral hemorrhage  . Liver cancer Maternal Grandmother   . Atrial  fibrillation Son   . Celiac disease Son   . Heart attack Neg Hx   . Breast cancer Neg Hx      Social History   Socioeconomic History  . Marital status: Widowed    Spouse name: Deceased  . Number of children: 2  . Years of education: 70  . Highest education level: Not on file  Occupational History  . Occupation: Retired  Scientific laboratory technician  . Financial resource strain: Not hard at all  . Food insecurity:    Worry: Never true    Inability: Never true  . Transportation needs:  Medical: No    Non-medical: No  Tobacco Use  . Smoking status: Never Smoker  . Smokeless tobacco: Never Used  Substance and Sexual Activity  . Alcohol use: No    Alcohol/week: 1.8 oz    Types: 3 Standard drinks or equivalent per week  . Drug use: No  . Sexual activity: Never  Lifestyle  . Physical activity:    Days per week: Not on file    Minutes per session: Not on file  . Stress: Not on file  Relationships  . Social connections:    Talks on phone: Not on file    Gets together: Not on file    Attends religious service: Not on file    Active member of club or organization: Not on file    Attends meetings of clubs or organizations: Not on file    Relationship status: Not on file  . Intimate partner violence:    Fear of current or ex partner: Not on file    Emotionally abused: Not on file    Physically abused: Not on file    Forced sexual activity: Not on file  Other Topics Concern  . Not on file  Social History Narrative   She is a widow.  Husband died from metastatic renal cell cancer. Originally from [place]. Previously lived in [place] from 1975-[place]. Moved to [place] in 2007. No mold exposure recently but did have it through a prior work exposure in 1993. Has a masters in public health. No bird exposure.      [facility] (09/29/17):   She has moved into a retirement community since last appointment. She reports she had testing at her new residence that was positive for mold. It has since  been treated.      BP 98/60   Pulse 83   Ht 5\' 5"  (1.651 m)   Wt 137 lb (62.1 kg)   SpO2 97%   BMI 22.80 kg/m   Physical Exam:  Well appearing 77 yo woman, NAD HEENT: Unremarkable Neck:  No JVD, no thyromegally Lymphatics:  No adenopathy Back:  No CVA tenderness Lungs:  Clear with no wheezes HEART:  Regular rate rhythm, no murmurs, no rubs, no clicks Abd:  soft, positive bowel sounds, no organomegally, no rebound, no guarding Ext:  2 plus pulses, no edema, no cyanosis, no clubbing Skin:  No rashes no nodules Neuro:  CN II through XII intact, motor grossly intact  EKG - nsr with ventricular pacing  DEVICE  Normal device function.  See PaceArt for details.   Assess/Plan: 1. PPM -her Medtronic device is working normally. Will recheck in several months. 2. Atrial fib - she is maintaining NSR. She will continue her current meds.  3. HTN - her blood pressure is a little low today but she is asymptomatic. Will follow.  Mikle Bosworth.D.

## 2018-06-10 ENCOUNTER — Encounter: Payer: Self-pay | Admitting: Physician Assistant

## 2018-06-10 ENCOUNTER — Ambulatory Visit (INDEPENDENT_AMBULATORY_CARE_PROVIDER_SITE_OTHER): Payer: Medicare Other | Admitting: Physician Assistant

## 2018-06-10 VITALS — BP 110/72 | HR 90 | Ht 65.0 in | Wt 139.0 lb

## 2018-06-10 DIAGNOSIS — I4819 Other persistent atrial fibrillation: Secondary | ICD-10-CM

## 2018-06-10 DIAGNOSIS — I481 Persistent atrial fibrillation: Secondary | ICD-10-CM | POA: Diagnosis not present

## 2018-06-10 DIAGNOSIS — Z952 Presence of prosthetic heart valve: Secondary | ICD-10-CM

## 2018-06-10 DIAGNOSIS — I5022 Chronic systolic (congestive) heart failure: Secondary | ICD-10-CM | POA: Diagnosis not present

## 2018-06-10 DIAGNOSIS — Z950 Presence of cardiac pacemaker: Secondary | ICD-10-CM

## 2018-06-10 NOTE — Patient Instructions (Signed)
Medication Instructions:  1. Your physician recommends that you continue on your current medications as directed. Please refer to the Current Medication list given to you today.   Labwork: NONE ORDERED TODAY   Testing/Procedures: NONE ORDERED TODAY  Follow-Up: 6 MONTHS WITH DR. NAHSER   Any Other Special Instructions Will Be Listed Below (If Applicable).     If you need a refill on your cardiac medications before your next appointment, please call your pharmacy.   

## 2018-06-10 NOTE — Progress Notes (Signed)
Cardiology Office Note:    Date:  06/10/2018   ID:  [name] [name], DOB 1941/10/01, MRN [id number]  PCP:  [name], MD  Cardiologist:  Mertie Moores, MD   Electrophysiologist:  Cristopher Peru, MD   Referring MD: [name], MD   Chief Complaint  Patient presents with  . Follow-up    Mitral valve disease, atrial fibrillation, heart failure    History of Present Illness:    [name] [name] is a 77 y.o. 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  She was last seen by Dr. Acie Fredrickson in December 2018.  She was seen by Dr. Lovena Le yesterday.  [name] returns for follow-up.  She is here alone.  She was seen by neurology last year and diagnosed with hemiplegic migraines.  Initially, it was felt that she had seizures.  She took [place] for several months with untoward side effects.  Her symptoms with her migraine involve left foot paralysis and left hand numbness/tingling.  She takes Fioricet as needed.  She denies chest discomfort, shortness of breath, syncope.  She denies PND or edema.  She denies any bleeding issues.  Prior CV studies:   The following studies were reviewed today:  Echocardiogram 02/12/2017 EF 40-45, anteroseptal, inferior, inferoseptal hypokinesis, normally functioning mitral valve bioprosthesis (mean 5), severe LAE, normal RVSF, mild TR, PASP 40  Echocardiogram 03/04/2016 EF 35-40, diffuse hypokinesis, normally functioning mitral valve bioprosthesis, moderate LAE, moderate RV dilation, mildly reduced RVSF  Carotid US 10/27/2015 Bilateral ICA 1-39  Cardiac catheterization 10/18/2015 Normal coronary arteries, EF >65, 4+ mitral regurgitation  with mitral valve prolapse  Past Medical History:  Diagnosis Date  . Acute on chronic diastolic (congestive) heart failure ([place])   . Allergy    See list  . Anemia 2018  . Anxiety    Occasionally take Xanax for sleep  . Anxiety associated with depression    Prn alprazolam   . Arthritis    Hands, Back  . Atrial fibrillation, persistent ([place])    DCCV 08/22/2015  . Bradycardia post-op bradycardia, pacer dependent   MDT PPM 11/06/15, Dr. Lovena Le  . Cancer ([place]) 1991   Basal cell on nose  . Cataract    Left eye  . Diverticulosis   . Heart murmur   . Hemorrhoid   . Hepatic cyst    innumerable  . 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 Hyperlipidemia   . IBS (irritable bowel syndrome)   . 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 Migraine   . MVP (mitral valve prolapse)   . Nodule of right lung   . Osteoporosis of forearm   . RBBB   . Restrictive lung disease    Mild on PFT & likely cardiac in etiology   . S/P Minimally invasive maze operation for atrial fibrillation 10/31/2015   Complete bilateral atrial lesion set using cryothermy and bipolar radiofrequency ablation with clipping of LA appendage via right mini thoracotomy approach  . S/P minimally invasive mitral valve replacement with bioprosthetic valve 10/31/2015   33 mm [facility] Mitral bovine bioprosthetic tissue valve placed via right mini thoracotomy approach  . Severe mitral regurgitation   . SVT (supraventricular tachycardia) ([place])   . Thoracic aorta atherosclerosis ([place])   .  TIA (transient ischemic attack)    Surgical Hx: The patient  has a past surgical history that includes Tubal ligation; Foot surgery; Varicose vein surgery (Right); Mandible fracture surgery (03-26-13); Colonoscopy (2003); Eye surgery (Right, 2013); Cardioversion (N/A, 08/22/2015); TEE without cardioversion (N/A, 08/22/2015); TEE with cardioversion; Cardiac catheterization (N/A, 10/18/2015); Minimally invasive maze procedure (N/A, 10/31/2015);  TEE without cardioversion (N/A, 10/31/2015); Mitral valve replacement (Right, 10/31/2015); Cardiac catheterization (N/A, 11/06/2015); Pacemaker lead removal (02/20/2016); Cardiac catheterization (N/A, 02/20/2016); and Breast biopsy (Right, Late 80s).   Current Medications: Current Meds  Medication Sig  . b complex vitamins tablet Take 1 tablet by mouth daily.  . butalbital-acetaminophen-caffeine (FIORICET, ESGIC) 50-325-40 MG tablet TAKE 1 TABLET BY MOUTH AS NEEDED FOR BACK PAIN OR MIGRAINE HEADACHE  . Cholecalciferol (VITAMIN D) 2000 units CAPS Take 2,000 Units by mouth daily.  . Coenzyme Q10 (CO Q 10) 100 MG CAPS Take 200 mg by mouth 2 (two) times daily.   Marland Kitchen conjugated estrogens (PREMARIN) vaginal cream Place 1 Applicatorful vaginally daily. For 2 weeks,  Then two nights per week thereafter  . ELIQUIS 5 MG TABS tablet TAKE ONE TABLET BY MOUTH TWICE DAILY  . Ferrous Sulfate (IRON) 325 (65 Fe) MG TABS   . folic acid (FOLVITE) 191 MCG tablet   . furosemide (LASIX) 40 MG tablet Take 40 mg by mouth as directed.  . metoprolol tartrate (LOPRESSOR) 25 MG tablet TAKE ONE-HALF TABLET BY MOUTH TWICE DAILY  . montelukast (SINGULAIR) 10 MG tablet Take 10 mg by mouth at bedtime.  . rosuvastatin (CRESTOR) 40 MG tablet TAKE ONE TABLET BY MOUTH EVERY DAY     Allergies:   Epinephrine; Meloxicam; Codeine; Doxycycline; Hydrocodone; Hydromorphone; Oxycodone; and Propofol   Social History   Tobacco Use  . Smoking status: Never Smoker  . Smokeless tobacco: Never Used  Substance Use Topics  . Alcohol use: No    Alcohol/week: 1.8 oz    Types: 3 Standard drinks or equivalent per week  . Drug use: No     Family Hx: The patient's family history includes Arrhythmia in her brother and mother; Atrial fibrillation in her son; Celiac disease in her son; Heart failure in her mother; Hypertension in her mother; Liver cancer in her maternal grandmother; Prostate cancer in her brother; Stroke in her father; Stroke (age  of onset: 5) in her brother; Stroke (age of onset: 17) in her maternal aunt. There is no history of Heart attack or Breast cancer.  ROS:   Please see the history of present illness.    Review of Systems  Eyes: Positive for visual disturbance.   All other systems reviewed and are negative.   EKGs/Labs/Other Test Reviewed:    EKG:  EKG is not ordered today.    Recent Labs: 04/07/2018: ALT 16; BUN 25; Creatinine, Ser 0.79; Hemoglobin 11.5; Platelets 109.0; Potassium 3.9; Sodium 139; TSH 4.05   Recent Lipid Panel Lab Results  Component Value Date/Time   CHOL 144 04/07/2018 01:11 PM   CHOL 162 12/05/2014 08:48 AM   TRIG 59.0 04/07/2018 01:11 PM   HDL 65.50 04/07/2018 01:11 PM   HDL 61 12/05/2014 08:48 AM   CHOLHDL 2 04/07/2018 01:11 PM   LDLCALC 67 04/07/2018 01:11 PM   LDLCALC 85 12/05/2014 08:48 AM   LDLDIRECT 113.0 02/15/2016 09:30 AM    Physical Exam:    VS:  BP 110/72   Pulse 90   Ht 5\' 5"  (1.651 m)   Wt 139 lb (63 kg)   SpO2 94%  BMI 23.13 kg/m     Wt Readings from Last 3 Encounters:  06/10/18 139 lb (63 kg)  06/09/18 137 lb (62.1 kg)  05/14/18 138 lb 6.4 oz (62.8 kg)     Physical Exam  Constitutional: She is oriented to person, place, and time. She appears well-developed and well-nourished. No distress.  HENT:  Head: Normocephalic and atraumatic.  Neck: Neck supple. No JVD present.  Cardiovascular: Normal rate, S1 normal, S2 normal and normal heart sounds. A regularly irregular rhythm present.  No murmur heard. Pulmonary/Chest: Effort normal. She has no rales.  Abdominal: Soft. There is no hepatomegaly.  Musculoskeletal: She exhibits no edema.  Neurological: She is alert and oriented to person, place, and time.  Skin: Skin is warm and dry.    ASSESSMENT & PLAN:    Persistent atrial fibrillation [facility]) She had an episode of atrial flutter back in December that lasted about an hour and a half.  She was seen by Dr. Lovena Le yesterday and interrogation of her  pacer and EKG confirmed she is maintaining normal sinus rhythm.  She had Maze procedure in 2016 at the time of her mitral valve surgery.  She is anticoagulated with Apixaban.  Hemoglobin and creatinine were stable in May 2019.  Continue current medical program.  Chronic systolic CHF (congestive heart failure) (HCC) EF decreased to 35-40% after her mitral valve surgery.  Most recent echo in March 2018 demonstrates EF 40-45%.  She is NYHA 2.  Blood pressure is too low to tolerate ACE inhibitor therapy.  Continue beta-blocker.  She takes Lasix as needed.  S/P MVR (mitral valve replacement) Normally functioning bioprosthetic valve by echocardiogram March 2018.  Continue SBE prophylaxis.  Cardiac pacemaker in situ   Follow-up with EP as planned.   Dispo:  Return in about 6 months (around 12/11/2018) for Routine Follow Up, w/ Dr. Acie Fredrickson.   Medication Adjustments/Labs and Tests Ordered: Current medicines are reviewed at length with the patient today.  Concerns regarding medicines are outlined above.  Tests Ordered: No orders of the defined types were placed in this encounter.  Medication Changes: No orders of the defined types were placed in this encounter.   Signed, Richardson Dopp, PA-C  06/10/2018 4:20 PM    [facility] [place], [place], [place]  [address] Phone: [phone]; Fax: [phone]

## 2018-06-11 LAB — CUP PACEART REMOTE DEVICE CHECK
Battery Impedance: 184 Ohm
Battery Remaining Longevity: 105 mo
Battery Voltage: 2.79 V
Brady Statistic AP VP Percent: 97 %
Brady Statistic AP VS Percent: 0 %
Brady Statistic AS VP Percent: 3 %
Brady Statistic AS VS Percent: 0 %
Date Time Interrogation Session: 20190702125043
Implantable Lead Implant Date: 20161205
Implantable Lead Implant Date: 20170321
Implantable Lead Location: 753859
Implantable Lead Location: 753860
Implantable Lead Model: 5076
Implantable Lead Model: 5076
Implantable Pulse Generator Implant Date: 20161205
Lead Channel Impedance Value: 458 Ohm
Lead Channel Impedance Value: 719 Ohm
Lead Channel Pacing Threshold Amplitude: 0.5 V
Lead Channel Pacing Threshold Amplitude: 0.5 V
Lead Channel Pacing Threshold Pulse Width: 0.4 ms
Lead Channel Pacing Threshold Pulse Width: 0.4 ms
Lead Channel Setting Pacing Amplitude: 2 V
Lead Channel Setting Pacing Amplitude: 2.5 V
Lead Channel Setting Pacing Pulse Width: 0.4 ms
Lead Channel Setting Sensing Sensitivity: 4 mV

## 2018-06-12 ENCOUNTER — Telehealth: Payer: Self-pay | Admitting: Internal Medicine

## 2018-06-12 ENCOUNTER — Encounter: Payer: Self-pay | Admitting: Internal Medicine

## 2018-06-12 NOTE — Telephone Encounter (Signed)
New message     1. Has your device fired? Not sure -has this strange feeling coming over her that goes down the arm and she feels like she is going to pass out , see her notes in mychart   2. Is you device beeping? no  3. Are you experiencing draining or swelling at device site? no  4. Are you calling to see if we received your device transmission?  no  5. Have you passed out? no    Please route to [place]

## 2018-06-12 NOTE — Telephone Encounter (Signed)
Remote transmission reviewed. Presenting rhythm: ApVp w/PACs. No episodes recorded. Stable lead measurements. Normal device function.  Reviewed remote with Dr.Taylor, recommended f/u w/PCP. Informed patient about results of remote transmission and Dr.Taylor's recommendation. Patient verbalized understanding.

## 2018-06-12 NOTE — Telephone Encounter (Signed)
Called patient regarding recent sx's. I informed patient that there were no changes made to her settings at her appt on 7/9. I asked patient to send in a remote transmission for review. Patient verbalized understanding.  Will call patient once it's received.

## 2018-07-01 ENCOUNTER — Other Ambulatory Visit: Payer: Self-pay | Admitting: Internal Medicine

## 2018-07-08 ENCOUNTER — Other Ambulatory Visit: Payer: Self-pay | Admitting: Cardiovascular Disease

## 2018-07-16 DIAGNOSIS — M810 Age-related osteoporosis without current pathological fracture: Secondary | ICD-10-CM

## 2018-07-16 DIAGNOSIS — Z1239 Encounter for other screening for malignant neoplasm of breast: Secondary | ICD-10-CM

## 2018-07-16 NOTE — Telephone Encounter (Signed)
Orders for mammogram and dexa scan have been done.

## 2018-07-23 ENCOUNTER — Telehealth: Payer: Self-pay | Admitting: Internal Medicine

## 2018-07-23 MED ORDER — BUTALBITAL-APAP-CAFFEINE 50-325-40 MG PO TABS: ORAL_TABLET | ORAL | 5 refills | Status: DC

## 2018-07-23 NOTE — Telephone Encounter (Signed)
Pharmacy is needing clarification on maximum dose per day.

## 2018-07-23 NOTE — Telephone Encounter (Signed)
Medication resent with directions amended to say "maximum of 3 daily "

## 2018-07-23 NOTE — Telephone Encounter (Signed)
Copied from [place] [id number]. Topic: Quick Communication - Rx Refill/Question >> Jul 23, 2018  8:55 AM [name] [name] wrote: Medication: butalbital-acetaminophen-caffeine (FIORICET, ESGIC) 50-325-40 MG tablet (please call pharmacy to clarify max dosage)  Has the patient contacted their pharmacy? Yes.   (Agent: If no, request that the patient contact the pharmacy for the refill.) (Agent: If yes, when and what did the pharmacy advise?)  Preferred Pharmacy (with phone number or street name): [facility] [address] [place] [address] Phone: [phone] Fax: [phone]  Agent: Please be advised that RX refills may take up to 3 business days. We ask that you follow-up with your pharmacy.

## 2018-07-24 NOTE — Telephone Encounter (Signed)
Medication was [facility] to [facility] pt no longer uses that pharm. Pt uses [facility] and they will call [facility] for the clarification on medication

## 2018-08-13 ENCOUNTER — Ambulatory Visit
Admission: RE | Admit: 2018-08-13 | Discharge: 2018-08-13 | Disposition: A | Discharge status: Home / Self Care | Payer: Medicare Other | Source: Ambulatory Visit | Attending: Internal Medicine | Admitting: Internal Medicine

## 2018-08-13 DIAGNOSIS — M810 Age-related osteoporosis without current pathological fracture: Secondary | ICD-10-CM | POA: Insufficient documentation

## 2018-08-13 DIAGNOSIS — Z1231 Encounter for screening mammogram for malignant neoplasm of breast: Secondary | ICD-10-CM | POA: Diagnosis present

## 2018-08-13 DIAGNOSIS — Z1239 Encounter for other screening for malignant neoplasm of breast: Secondary | ICD-10-CM

## 2018-08-31 ENCOUNTER — Telehealth: Payer: Self-pay | Admitting: *Deleted

## 2018-08-31 NOTE — Telephone Encounter (Signed)
Prolia verification approved and ready to schedule.  Scheduled 10/01/18

## 2018-09-01 ENCOUNTER — Ambulatory Visit (INDEPENDENT_AMBULATORY_CARE_PROVIDER_SITE_OTHER): Payer: Medicare Other | Admitting: *Deleted

## 2018-09-01 DIAGNOSIS — I442 Atrioventricular block, complete: Secondary | ICD-10-CM | POA: Diagnosis not present

## 2018-09-01 DIAGNOSIS — I495 Sick sinus syndrome: Secondary | ICD-10-CM

## 2018-09-01 NOTE — Progress Notes (Signed)
Remote pacemaker transmission.   

## 2018-09-07 LAB — CUP PACEART REMOTE DEVICE CHECK
Battery Impedance: 208 Ohm
Battery Remaining Longevity: 104 mo
Battery Voltage: 2.79 V
Brady Statistic AP VP Percent: 80 %
Brady Statistic AP VS Percent: 0 %
Brady Statistic AS VP Percent: 20 %
Brady Statistic AS VS Percent: 0 %
Date Time Interrogation Session: 20191001124801
Implantable Lead Implant Date: 20161205
Implantable Lead Implant Date: 20170321
Implantable Lead Location: 753859
Implantable Lead Location: 753860
Implantable Lead Model: 5076
Implantable Lead Model: 5076
Implantable Pulse Generator Implant Date: 20161205
Lead Channel Impedance Value: 478 Ohm
Lead Channel Impedance Value: 736 Ohm
Lead Channel Pacing Threshold Amplitude: 0.5 V
Lead Channel Pacing Threshold Amplitude: 0.625 V
Lead Channel Pacing Threshold Pulse Width: 0.4 ms
Lead Channel Pacing Threshold Pulse Width: 0.4 ms
Lead Channel Setting Pacing Amplitude: 2 V
Lead Channel Setting Pacing Amplitude: 2.5 V
Lead Channel Setting Pacing Pulse Width: 0.4 ms
Lead Channel Setting Sensing Sensitivity: 4 mV

## 2018-09-16 ENCOUNTER — Other Ambulatory Visit: Payer: Self-pay | Admitting: Neurology

## 2018-09-16 DIAGNOSIS — I6529 Occlusion and stenosis of unspecified carotid artery: Secondary | ICD-10-CM

## 2018-09-23 ENCOUNTER — Ambulatory Visit: Payer: Medicare Other

## 2018-09-28 ENCOUNTER — Ambulatory Visit: Payer: Medicare Other

## 2018-09-28 ENCOUNTER — Ambulatory Visit
Admission: RE | Admit: 2018-09-28 | Discharge: 2018-09-28 | Disposition: A | Discharge status: Home / Self Care | Payer: Medicare Other | Source: Ambulatory Visit | Attending: Neurology | Admitting: Neurology

## 2018-09-28 DIAGNOSIS — I6529 Occlusion and stenosis of unspecified carotid artery: Secondary | ICD-10-CM | POA: Insufficient documentation

## 2018-09-28 HISTORY — DX: Basal cell carcinoma of skin, unspecified: C44.91

## 2018-09-28 LAB — POCT I-STAT CREATININE: Creatinine, Ser: 0.8 mg/dL (ref 0.44–1.00)

## 2018-09-28 MED ORDER — IOPAMIDOL (ISOVUE-370) INJECTION 76%
75.0000 mL | Freq: Once | INTRAVENOUS | Status: AC | PRN
  Administered 2018-09-28: 75 mL via INTRAVENOUS

## 2018-09-29 ENCOUNTER — Ambulatory Visit (INDEPENDENT_AMBULATORY_CARE_PROVIDER_SITE_OTHER): Payer: Medicare Other

## 2018-09-29 ENCOUNTER — Encounter: Payer: Self-pay | Admitting: Family Medicine

## 2018-09-29 ENCOUNTER — Ambulatory Visit (INDEPENDENT_AMBULATORY_CARE_PROVIDER_SITE_OTHER): Payer: Medicare Other | Admitting: Family Medicine

## 2018-09-29 VITALS — BP 106/68 | HR 90 | Temp 98.3°F | Ht 65.0 in | Wt 142.0 lb

## 2018-09-29 DIAGNOSIS — J069 Acute upper respiratory infection, unspecified: Secondary | ICD-10-CM | POA: Diagnosis not present

## 2018-09-29 DIAGNOSIS — R05 Cough: Secondary | ICD-10-CM

## 2018-09-29 DIAGNOSIS — R059 Cough, unspecified: Secondary | ICD-10-CM

## 2018-09-29 MED ORDER — METHYLPREDNISOLONE 4 MG PO TBPK: ORAL_TABLET | ORAL | 0 refills | Status: DC

## 2018-09-29 MED ORDER — BENZONATATE 100 MG PO CAPS: 100.0000 mg | ORAL_CAPSULE | Freq: Two times a day (BID) | ORAL | 0 refills | Status: DC | PRN

## 2018-09-29 NOTE — Telephone Encounter (Signed)
FYI appointment scheduled with Ander Purpura, NP at 3:15pm

## 2018-09-29 NOTE — Progress Notes (Signed)
Subjective:    Patient ID: [name] [name], female    DOB: [birth year], 77 y.o.   MRN: [id number]  HPI   Patient presents to clinic planing of sinus pressure and congestion that began 3 weeks ago.  It did get better by using her allergy medicine, doing saline nasal flushes and increasing fluids.  Patient now complains of a cough that she has had for 10 days.  States it is like a tickle in the back of her throat, mostly dry, sometimes mucus will be yellow or green in color when she coughs it out.  Denies fever or chills.  Denies shortness of breath or wheezing.   Patient Active Problem List   Diagnosis Date Noted  . Hemiplegic migraine without status migrainosus, not intractable 05/14/2018  . Hematuria, gross 04/09/2018  . Arthritis of both hands 12/14/2017  . Allergy to environmental factors 08/27/2017  . Dermatitis due to plants, including poison ivy, sumac, and oak 05/29/2017  . Anemia 02/17/2017  . DOE (dyspnea on exertion) 01/28/2017  . Visit for preventive health examination 05/23/2016  . Hepatic cyst 05/04/2016  . Chronic systolic congestive heart failure ([place]) 05/03/2016  . Peliosis hepatis 02/15/2016  . Prediabetes 02/15/2016  . Pacemaker 11/06/2015  . H/O mitral valve replacement 10/31/2015  . S/P Minimally invasive maze operation for atrial fibrillation 10/31/2015  . Atrial fibrillation, persistent   . Restrictive lung disease 09/12/2015  . Pulmonary hypertension ([place]) 08/08/2015  . Nodule of right lung 08/04/2015  . Thoracic aorta atherosclerosis ([place]) 02/05/2015  . Family history of cerebral aneurysm 08/02/2014  . Diverticulosis of colon without hemorrhage 01/09/2014  . Thrombocytopenia ([place]) 02/04/2013  . IBS (irritable bowel syndrome) 12/31/2012  . History of asbestos exposure 06/27/2012  . Osteoporosis of forearm 06/27/2012  . Anxiety associated with depression 06/25/2012  . Cataract extraction status of right eye 06/25/2012  . RBBB 05/05/2012  . Hyperlipidemia  08/06/2011  . Mitral valve prolapse 05/02/2011   Social History   Tobacco Use  . Smoking status: Never Smoker  . Smokeless tobacco: Never Used  Substance Use Topics  . Alcohol use: No    Alcohol/week: 3.0 standard drinks    Types: 3 Standard drinks or equivalent per week    Review of Systems  Constitutional: Negative for chills, fatigue and fever.  HENT:+ congestion, sinus pain/drainage (better).   Eyes: Negative.   Respiratory: +cough. Negative for shortness of breath and wheezing.   Cardiovascular: Negative for chest pain, palpitations and leg swelling.  Gastrointestinal: Negative for abdominal pain, diarrhea, nausea and vomiting.  Genitourinary: Negative for dysuria, frequency and urgency.  Musculoskeletal: Negative for arthralgias and myalgias.  Skin: Negative for color change, pallor and rash.  Neurological: Negative for syncope, light-headedness.  Psychiatric/Behavioral: The patient is not nervous/anxious.    Objective:   Physical Exam  Constitutional: She is oriented to person, place, and time. She appears well-nourished. No distress.  HENT:  Head: Normocephalic and atraumatic.  Nose: Mucosal edema and rhinorrhea present.  +post nasal drip  Eyes: Pupils are equal, round, and reactive to light. EOM are normal. No scleral icterus.  Neck: Neck supple. No tracheal deviation present.  Cardiovascular: Normal rate and regular rhythm.  Pulmonary/Chest: Effort normal and breath sounds normal. No respiratory distress. She has no wheezes. She has no rales.  Musculoskeletal: She exhibits no edema.  Neurological: She is alert and oriented to person, place, and time.  Skin: Skin is warm and dry. No pallor.  Psychiatric: She has a normal mood and  affect. Her behavior is normal.  Nursing note and vitals reviewed.  Vitals:   09/29/18 1522  BP: 106/68  Pulse: 90  Temp: 98.3 F (36.8 C)  SpO2: 98%      Assessment & Plan:   Viral URI/cough in adult-we will get chest x-ray  in clinic to rule out any pneumonia.  Overall lungs do sound clear on exam I suspect cough is related to postnasal drip/viral respiratory infection.  Patient will take prednisone taper and also use Tessalon Perles to call cough.  Advised to rest, increase fluid intake, do good handwashing.  We will make patient aware of results once they are available and if pneumonia is present on x-ray we will treat accordingly.  Keep regular follow-up as planned with PCP.  Return to clinic sooner if issues arise or if current symptoms persist or worsen.

## 2018-09-29 NOTE — Patient Instructions (Signed)

## 2018-09-30 ENCOUNTER — Encounter: Payer: Self-pay | Admitting: Family Medicine

## 2018-10-01 ENCOUNTER — Other Ambulatory Visit: Payer: Self-pay | Admitting: Internal Medicine

## 2018-10-01 ENCOUNTER — Ambulatory Visit (INDEPENDENT_AMBULATORY_CARE_PROVIDER_SITE_OTHER): Payer: Medicare Other

## 2018-10-01 DIAGNOSIS — M810 Age-related osteoporosis without current pathological fracture: Secondary | ICD-10-CM

## 2018-10-01 MED ORDER — DENOSUMAB 60 MG/ML ~~LOC~~ SOSY
60.0000 mg | PREFILLED_SYRINGE | Freq: Once | SUBCUTANEOUS | Status: AC
  Administered 2018-10-01: 60 mg via SUBCUTANEOUS

## 2018-10-01 NOTE — Progress Notes (Signed)
Patient comes in for Prolia .  Injected right arm subcutaneously.   Patient tolerated injection well. She will follow in six months for her next injection.

## 2018-10-01 NOTE — Progress Notes (Signed)
  I have reviewed the above information and agree with above.   Nickoles Gregori, MD 

## 2018-10-06 ENCOUNTER — Encounter (INDEPENDENT_AMBULATORY_CARE_PROVIDER_SITE_OTHER): Payer: Self-pay | Admitting: Vascular Surgery

## 2018-10-06 ENCOUNTER — Ambulatory Visit (INDEPENDENT_AMBULATORY_CARE_PROVIDER_SITE_OTHER): Payer: Medicare Other | Admitting: Vascular Surgery

## 2018-10-06 VITALS — BP 90/60 | HR 80 | Resp 18 | Ht 65.0 in | Wt 138.0 lb

## 2018-10-06 DIAGNOSIS — I4819 Other persistent atrial fibrillation: Secondary | ICD-10-CM

## 2018-10-06 DIAGNOSIS — I6529 Occlusion and stenosis of unspecified carotid artery: Secondary | ICD-10-CM

## 2018-10-06 DIAGNOSIS — I672 Cerebral atherosclerosis: Secondary | ICD-10-CM | POA: Diagnosis not present

## 2018-10-06 DIAGNOSIS — G43409 Hemiplegic migraine, not intractable, without status migrainosus: Secondary | ICD-10-CM

## 2018-10-06 DIAGNOSIS — E7800 Pure hypercholesterolemia, unspecified: Secondary | ICD-10-CM | POA: Diagnosis not present

## 2018-10-06 NOTE — Assessment & Plan Note (Signed)
I have reviewed her CT angiogram.  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  There is no significant stenosis of concern.  Her atherosclerosis is actually quite mild.  No vascular intervention is necessary were of benefit at this point.  I will see her in a year with duplex for follow-up.  Continue statin agent.  On Eliquis for atrial fibrillation but that would suffice for appropriate medical management.

## 2018-10-06 NOTE — Assessment & Plan Note (Signed)
Multiple episodes of left-sided weakness which is short-term and always resolves.  I am not sure what this is, but I do not think it is vascular in origin.

## 2018-10-06 NOTE — Progress Notes (Signed)
Patient ID: [name] [name], female   DOB: Oct 24, 1941, 77 y.o.   MRN: [id number]  Chief Complaint  Patient presents with  . New Patient (Initial Visit)    Intracranial ASO    HPI [name] [name] is a 77 y.o. female.  I am asked to see the patient by Dr. Manuella Ghazi for evaluation of intracranial vascular disease.  The patient reports over the past 2 years, having multiple episodes of left-sided hemiplegia.  It is always her left hand and her left arm.  These episodes generally last less than 5 minutes although one episode lasted 14 minutes.  They have always spontaneously resolved.  She has a history of migraines.  She has seen the neurologist in the cause of this is not entirely clear.  She was treated with some medications which actually seemed to make the situation worse and she has been weaned off of these medications.  She has atrial fibrillation and is on chronic anticoagulation.  She has never had right-sided symptoms.  No speech or swallowing symptoms.  No facial symptoms.  She has undergone a CT angiogram. I have reviewed her CT angiogram.  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  There is no significant stenosis of concern.  Her atherosclerosis is actually quite mild.   Past Medical History:  Diagnosis Date  . Acute on chronic diastolic (congestive) heart failure ([place])   . Allergy    See list  . Anemia 2018  . Anxiety    Occasionally take Xanax for sleep  . Anxiety associated with depression    Prn alprazolam   . Anxiety associated with depression   . Arthritis    Hands, Back  . Atrial fibrillation, persistent    DCCV 08/22/2015  . Bradycardia post-op bradycardia, pacer dependent   MDT PPM 11/06/15, Dr. Lovena Le  . Cataract    Left eye  . Diverticulosis   . Heart murmur   . Hemorrhoid   . Hepatic cyst    innumerable  . 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 Hyperlipidemia   . IBS (irritable  bowel syndrome)   . 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 Migraine   . MVP (mitral valve prolapse)   . Nodule of right lung   . Osteoporosis of forearm   . RBBB   . Restrictive lung disease    Mild on PFT & likely cardiac in etiology   . S/P Minimally invasive maze operation for atrial fibrillation 10/31/2015   Complete bilateral atrial lesion set using cryothermy and bipolar radiofrequency ablation with clipping of LA appendage via right mini thoracotomy approach  . S/P minimally invasive mitral valve replacement with bioprosthetic valve 10/31/2015   33 mm [facility] Mitral bovine bioprosthetic tissue valve placed via right mini thoracotomy approach  . Severe mitral regurgitation   . 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 SVT (supraventricular tachycardia) ([place])   . Thoracic aorta atherosclerosis ([place])   . TIA (transient ischemic attack)     Past Surgical History:  Procedure Laterality Date  . BREAST BIOPSY Right Late 80s   Negative  . CARDIAC CATHETERIZATION N/A 10/18/2015   Procedure: Right/Left Heart Cath and Coronary Angiography;  Surgeon: Sherren Mocha, MD;  Location: [place] [facility];  Service: Cardiovascular;  Laterality: N/A;  . CARDIOVERSION N/A 08/22/2015   Procedure: CARDIOVERSION;  Surgeon: Thayer Headings, MD;  Location: [place];  Service: Cardiovascular;  Laterality: N/A;  . COLONOSCOPY  2003  . EP IMPLANTABLE DEVICE N/A 11/06/2015   Procedure: Pacemaker Implant;  Surgeon: Evans Lance, MD;  Location: [place] [facility];  Service: Cardiovascular;  Laterality: N/A;  . EP IMPLANTABLE DEVICE N/A 02/20/2016   Procedure: Lead Extraction;  Surgeon: Evans Lance, MD;  Location: [place] [facility];  Service: Cardiovascular;  Laterality: N/A;  . EYE SURGERY Right 2013  . FOOT SURGERY    . MANDIBLE FRACTURE SURGERY  03-26-13  . MINIMALLY INVASIVE MAZE PROCEDURE N/A 10/31/2015   Procedure: MINIMALLY INVASIVE MAZE PROCEDURE;  Surgeon:  Rexene Alberts, MD;  Location: [place];  Service: Open Heart Surgery;  Laterality: N/A;  . MITRAL VALVE REPLACEMENT Right 10/31/2015   Procedure: MINIMALLY INVASIVE MITRAL VALVE (MV) REPLACEMENT;  Surgeon: Rexene Alberts, MD;  Location: [place];  Service: Open Heart Surgery;  Laterality: Right;  . PACEMAKER LEAD REMOVAL  02/20/2016  . TEE WITH CARDIOVERSION    . TEE WITHOUT CARDIOVERSION N/A 08/22/2015   Procedure: TRANSESOPHAGEAL ECHOCARDIOGRAM (TEE);  Surgeon: Thayer Headings, MD;  Location: [place];  Service: Cardiovascular;  Laterality: N/A;  . TEE WITHOUT CARDIOVERSION N/A 10/31/2015   Procedure: TRANSESOPHAGEAL ECHOCARDIOGRAM (TEE);  Surgeon: Rexene Alberts, MD;  Location: [place];  Service: Open Heart Surgery;  Laterality: N/A;  . TUBAL LIGATION    . VARICOSE VEIN SURGERY Right     Family History  Problem Relation Age of Onset  . Hypertension Mother   . Arrhythmia Mother   . Heart failure Mother   . Arrhythmia Brother   . Stroke Brother 76       cerebral hemorrhage, nonsmoker, no HTN  . Prostate cancer Brother   . Stroke Father        from an aneurysm  . Stroke Maternal Aunt 83       cerebral hemorrhage  . Liver cancer Maternal Grandmother   . Atrial fibrillation Son   . Celiac disease Son   . Heart attack Neg Hx   . Breast cancer Neg Hx     Social History Social History   Tobacco Use  . Smoking status: Never Smoker  . Smokeless tobacco: Never Used  Substance Use Topics  . Alcohol use: No    Alcohol/week: 3.0 standard drinks    Types: 3 Standard drinks or equivalent per week  . Drug use: No    Allergies  Allergen Reactions  . Epinephrine Palpitations  . Meloxicam Other (See Comments)    Severe reflux  . Codeine Nausea And Vomiting    migraine  . Doxycycline Itching and Swelling    Facial  . Hydrocodone Nausea And Vomiting    MIGRAINE  . Hydromorphone Nausea And Vomiting    migraine  . Oxycodone Nausea And Vomiting    Severe migraine  . Propofol  Other (See Comments)    Current Outpatient Medications  Medication Sig Dispense Refill  . b complex vitamins tablet Take 1 tablet by mouth daily.    . benzonatate (TESSALON) 100 MG capsule Take 1 capsule (100 mg total) by mouth 2 (two) times daily as needed for cough. 20 capsule 0  . butalbital-acetaminophen-caffeine (FIORICET, ESGIC) 50-325-40 MG tablet TAKE 1 TABLET BY MOUTH AS NEEDED FOR BACK PAIN OR MIGRAINE HEADACHE 90 tablet 5  . Cholecalciferol (VITAMIN D) 2000 units CAPS Take 2,000 Units by mouth daily.    . Coenzyme Q10 (CO  Q 10) 100 MG CAPS Take 200 mg by mouth 2 (two) times daily.     Marland Kitchen conjugated estrogens (PREMARIN) vaginal cream Place 1 Applicatorful vaginally daily. For 2 weeks,  Then two nights per week thereafter 42.5 g 12  . ELIQUIS 5 MG TABS tablet TAKE ONE TABLET BY MOUTH TWICE DAILY 180 tablet 1  . Ferrous Sulfate (IRON) 325 (65 Fe) MG TABS     . folic acid (FOLVITE) 706 MCG tablet     . furosemide (LASIX) 40 MG tablet Take 40 mg by mouth as directed.    . metoprolol tartrate (LOPRESSOR) 25 MG tablet TAKE ONE-HALF TABLET BY MOUTH TWICE DAILY 90 tablet 3  . montelukast (SINGULAIR) 10 MG tablet Take 10 mg by mouth at bedtime.    . rosuvastatin (CRESTOR) 40 MG tablet TAKE ONE TABLET BY MOUTH EVERY DAY 90 tablet 1  . vitamin B-12 (CYANOCOBALAMIN) 1000 MCG tablet Take 1,000 mcg by mouth daily.     No current facility-administered medications for this visit.       REVIEW OF SYSTEMS (Negative unless checked)  Constitutional: [] Weight loss  [] Fever  [] Chills Cardiac: [] Chest pain   [] Chest pressure   [] Palpitations   [] Shortness of breath when laying flat   [] Shortness of breath at rest   [] Shortness of breath with exertion. Vascular:  [] Pain in legs with walking   [] Pain in legs at rest   [] Pain in legs when laying flat   [] Claudication   [] Pain in feet when walking  [] Pain in feet at rest  [] Pain in feet when laying flat   [] History of DVT   [] Phlebitis   [] Swelling in  legs   [] Varicose veins   [] Non-healing ulcers Pulmonary:   [] Uses home oxygen   [] Productive cough   [] Hemoptysis   [] Wheeze  [] COPD   [] Asthma Neurologic:  [] Dizziness  [] Blackouts   [] Seizures   [] History of stroke   [x] History of TIA  [] Aphasia   [] Temporary blindness   [] Dysphagia   [x] Weakness or numbness in arms   [x] Weakness or numbness in legs Musculoskeletal:  [x] Arthritis   [] Joint swelling   [] Joint pain   [] Low back pain Hematologic:  [] Easy bruising  [] Easy bleeding   [] Hypercoagulable state   [] Anemic  [] Hepatitis Gastrointestinal:  [] Blood in stool   [] Vomiting blood  [] Gastroesophageal reflux/heartburn   [] Abdominal pain Genitourinary:  [] Chronic kidney disease   [] Difficult urination  [] Frequent urination  [] Burning with urination   [] Hematuria Skin:  [] Rashes   [] Ulcers   [] Wounds Psychological:  [] History of anxiety   []  History of major depression.    Physical Exam BP 90/60 (BP Location: Right Arm, Patient Position: Sitting)   Pulse 80   Resp 18   Ht 5\' 5"  (1.651 m)   Wt 138 lb (62.6 kg)   BMI 22.96 kg/m  Gen:  WD/WN, NAD. Appears younger than stated. Head: [place]/AT, No temporalis wasting.  Ear/Nose/Throat: Hearing grossly intact, nares w/o erythema or drainage, oropharynx w/o Erythema/Exudate Eyes: Conjunctiva clear, sclera non-icteric  Neck: trachea midline.  Pulmonary:  Good air movement, not labored, no use of accessory muscles Cardiac: RRR, no JVD Vascular:  Vessel Right Left  Radial Palpable Palpable                                   Gastrointestinal: soft, non-tender/non-distended.  Musculoskeletal: M/S 5/5 throughout.  Extremities without ischemic changes.  No deformity or atrophy. No  edema. Neurologic: Sensation grossly intact in extremities.  Symmetrical.  Speech is fluent. Motor exam as listed above. Psychiatric: Judgment intact, Mood & affect appropriate for pt's clinical situation. Dermatologic: No rashes or ulcers noted.  No cellulitis or  open wounds.   Radiology Ct Angio Head W Or Wo Contrast  Result Date: 09/28/2018 CLINICAL DATA:  Occlusion and stenosis unspecified carotid artery. EXAM: CT ANGIOGRAPHY HEAD AND NECK TECHNIQUE: Multidetector CT imaging of the head and neck was performed using the standard protocol during bolus administration of intravenous contrast. Multiplanar CT image reconstructions and MIPs were obtained to evaluate the vascular anatomy. Carotid stenosis measurements (when applicable) are obtained utilizing NASCET criteria, using the distal internal carotid diameter as the denominator. CONTRAST:  29mL ISOVUE-370 IOPAMIDOL (ISOVUE-370) INJECTION 76% COMPARISON:  CTA head and neck 10/24/2017 FINDINGS: CT HEAD FINDINGS Brain: No evidence of acute infarction, hemorrhage, hydrocephalus, extra-axial collection or mass lesion/mass effect. Vascular: Mild atherosclerotic calcification in the cavernous carotid bilaterally. Negative for hyperdense vessel Skull: Negative Sinuses: Negative Orbits: Negative Review of the MIP images confirms the above findings CTA NECK FINDINGS Aortic arch: Mild atherosclerotic calcification in the aortic arch without aneurysm. Mild calcification in the proximal subclavian artery bilaterally without stenosis. Right carotid system: Right carotid artery widely patent without significant stenosis. Minimal atherosclerotic calcification at the bifurcation. Mild beading of the distal right internal carotid artery possible FMD Left carotid system: Left carotid system widely patent without stenosis. Mild atherosclerotic calcification at the bifurcation. Vertebral arteries: Right vertebral artery dominant. Beading of the distal right vertebral artery at the C1-2 level consistent with fibromuscular dysplasia. Left vertebral artery non dominant with possible mild FMD. Both vertebral arteries patent without stenosis to the basilar. Skeleton: Thoracic scoliosis.  No acute skeletal abnormality. Other neck: No soft  tissue mass. Upper chest: Lung apices clear bilaterally. Transvenous pacemaker on the left. Review of the MIP images confirms the above findings CTA HEAD FINDINGS Anterior circulation: Mild atherosclerotic calcification in the cavernous carotid bilaterally without stenosis. Anterior and middle cerebral arteries patent bilaterally without stenosis. Posterior circulation: Both vertebral arteries patent to the basilar. Mild stenosis distal basilar is unchanged from the prior study. PICA patent bilaterally. Superior cerebellar and posterior cerebral arteries patent bilaterally without stenosis. Venous sinuses: Patent Anatomic variants: None Delayed phase: Normal enhancement on delayed imaging Review of the MIP images confirms the above findings IMPRESSION: No significant carotid or vertebral artery stenosis in the neck. 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 Mild stenosis distal basilar unchanged. No other significant intracranial stenosis. Electronically Signed   By: Franchot Gallo M.D.   On: 09/28/2018 15:59   Dg Chest 2 View  Result Date: 09/30/2018 CLINICAL DATA:  Cough for the past 10 days. EXAM: CHEST - 2 VIEW COMPARISON:  02/21/2016 and chest CT dated 08/07/2017. FINDINGS: Stable enlarged cardiac silhouette and tortuous and calcified thoracic aorta. Stable left subclavian pacemaker leads, prosthetic heart valve and left atrial clip. The lungs remain mildly hyperexpanded with mild peribronchial thickening. Less prominent linear density in the right mid and lower lung zones. Moderate dextroconvex lumbar scoliosis and mild levoconvex thoracic scoliosis. IMPRESSION: 1. Mildly improved right lung linear scarring. 2. Stable cardiomegaly and mild changes of COPD and chronic bronchitis. Electronically Signed   By: Claudie Revering M.D.   On: 09/30/2018 08:55   Ct Angio Neck W Or Wo Contrast  Result Date: 09/28/2018 CLINICAL DATA:  Occlusion and stenosis  unspecified carotid artery. EXAM: CT ANGIOGRAPHY HEAD AND NECK  TECHNIQUE: Multidetector CT imaging of the head and neck was performed using the standard protocol during bolus administration of intravenous contrast. Multiplanar CT image reconstructions and MIPs were obtained to evaluate the vascular anatomy. Carotid stenosis measurements (when applicable) are obtained utilizing NASCET criteria, using the distal internal carotid diameter as the denominator. CONTRAST:  24mL ISOVUE-370 IOPAMIDOL (ISOVUE-370) INJECTION 76% COMPARISON:  CTA head and neck 10/24/2017 FINDINGS: CT HEAD FINDINGS Brain: No evidence of acute infarction, hemorrhage, hydrocephalus, extra-axial collection or mass lesion/mass effect. Vascular: Mild atherosclerotic calcification in the cavernous carotid bilaterally. Negative for hyperdense vessel Skull: Negative Sinuses: Negative Orbits: Negative Review of the MIP images confirms the above findings CTA NECK FINDINGS Aortic arch: Mild atherosclerotic calcification in the aortic arch without aneurysm. Mild calcification in the proximal subclavian artery bilaterally without stenosis. Right carotid system: Right carotid artery widely patent without significant stenosis. Minimal atherosclerotic calcification at the bifurcation. Mild beading of the distal right internal carotid artery possible FMD Left carotid system: Left carotid system widely patent without stenosis. Mild atherosclerotic calcification at the bifurcation. Vertebral arteries: Right vertebral artery dominant. Beading of the distal right vertebral artery at the C1-2 level consistent with fibromuscular dysplasia. Left vertebral artery non dominant with possible mild FMD. Both vertebral arteries patent without stenosis to the basilar. Skeleton: Thoracic scoliosis.  No acute skeletal abnormality. Other neck: No soft tissue mass. Upper chest: Lung apices clear bilaterally. Transvenous pacemaker on the left. Review of the MIP images confirms the  above findings CTA HEAD FINDINGS Anterior circulation: Mild atherosclerotic calcification in the cavernous carotid bilaterally without stenosis. Anterior and middle cerebral arteries patent bilaterally without stenosis. Posterior circulation: Both vertebral arteries patent to the basilar. Mild stenosis distal basilar is unchanged from the prior study. PICA patent bilaterally. Superior cerebellar and posterior cerebral arteries patent bilaterally without stenosis. Venous sinuses: Patent Anatomic variants: None Delayed phase: Normal enhancement on delayed imaging Review of the MIP images confirms the above findings IMPRESSION: No significant carotid or vertebral artery stenosis in the neck. 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 Mild stenosis distal basilar unchanged. No other significant intracranial stenosis. Electronically Signed   By: Franchot Gallo M.D.   On: 09/28/2018 15:59    Labs Recent Results (from the past 2160 hour(s))  CUP PACEART REMOTE DEVICE CHECK     Status: None   Collection Time: 09/01/18 12:48 PM  Result Value Ref Range   Date Time Interrogation Session [phone]    Pulse Generator Manufacturer MERM    Pulse Gen Model [id number] Adapta    Pulse Gen Serial Number [id number]    Clinic Name [facility]    Implantable Pulse Generator Type Implantable Pulse Generator    Implantable Pulse Generator Implant Date [id number]    Implantable Lead Manufacturer [facility]    Implantable Lead Model 5076 CapSureFix Novus    Implantable Lead Serial Number [id number]    Implantable Lead Implant Date 60630160    Implantable Lead Location Detail 1 UNKNOWN    Implantable Lead Location [id number]    Implantable Lead Manufacturer [facility]    Implantable Lead Model 5076 CapSureFix Novus    Implantable Lead Serial Number [id number]    Implantable Lead Implant Date 10932355    Implantable Lead Location Detail 1 APEX    Implantable Lead Location  [id number]    Lead Channel Setting Sensing Sensitivity 4.00 mV   Lead Channel Setting Pacing Amplitude 2.000 V   Lead Channel Setting Pacing Pulse Width 0.40 ms  Lead Channel Setting Pacing Amplitude 2.500 V   Lead Channel Impedance Value 478 ohm   Lead Channel Pacing Threshold Amplitude 0.500 V   Lead Channel Pacing Threshold Pulse Width 0.40 ms   Lead Channel Impedance Value 736 ohm   Lead Channel Pacing Threshold Amplitude 0.625 V   Lead Channel Pacing Threshold Pulse Width 0.40 ms   Battery Status OK    Battery Remaining Longevity 104 mo   Battery Voltage 2.79 V   Battery Impedance 208 ohm   Brady Statistic AP VP Percent 80 %   Brady Statistic AS VP Percent 20 %   Brady Statistic AP VS Percent 0 %   Brady Statistic AS VS Percent 0 %   Eval Rhythm ApVp   I-STAT creatinine     Status: None   Collection Time: 09/28/18  1:54 PM  Result Value Ref Range   Creatinine, Ser 0.80 0.44 - 1.00 mg/dL    Assessment/Plan:  Hyperlipidemia lipid control important in reducing the progression of atherosclerotic disease. Continue statin therapy   Atrial fibrillation, persistent (HCC) On anticoagulation  Hemiplegic migraine without status migrainosus, not intractable Multiple episodes of left-sided weakness which is short-term and always resolves.  I am not sure what this is, but I do not think it is vascular in origin.  Intracranial atherosclerosis I have reviewed her CT angiogram.  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  There is no significant stenosis of concern.  Her atherosclerosis is actually quite mild.  No vascular intervention is necessary were of benefit at this point.  I will see her in a year with duplex for follow-up.  Continue statin agent.  On Eliquis for atrial fibrillation but that would suffice for appropriate medical management.      Leotis Pain 10/06/2018, 2:30 PM   This note  was created with Dragon medical transcription system.  Any errors from dictation are unintentional.

## 2018-10-06 NOTE — Assessment & Plan Note (Signed)
On anticoagulation 

## 2018-10-06 NOTE — Assessment & Plan Note (Signed)
lipid control important in reducing the progression of atherosclerotic disease. Continue statin therapy  

## 2018-10-11 DIAGNOSIS — G43409 Hemiplegic migraine, not intractable, without status migrainosus: Secondary | ICD-10-CM

## 2018-10-14 NOTE — Telephone Encounter (Signed)
REFERRAL TO DR HAGEN PER PATIENT.  I HAVE NEVER HEARD OF HIM SO I DO NOT HAVE HIS [facility] CONTACT INFO

## 2018-10-22 ENCOUNTER — Encounter (HOSPITAL_COMMUNITY): Payer: Self-pay | Admitting: Nurse Practitioner

## 2018-10-22 ENCOUNTER — Ambulatory Visit (HOSPITAL_COMMUNITY)
Admission: RE | Admit: 2018-10-22 | Discharge: 2018-10-22 | Disposition: A | Discharge status: Home / Self Care | Payer: Medicare Other | Source: Ambulatory Visit | Attending: Nurse Practitioner | Admitting: Nurse Practitioner

## 2018-10-22 VITALS — BP 124/74 | HR 76 | Ht 65.0 in | Wt 139.0 lb

## 2018-10-22 DIAGNOSIS — Z953 Presence of xenogenic heart valve: Secondary | ICD-10-CM | POA: Insufficient documentation

## 2018-10-22 DIAGNOSIS — Z9889 Other specified postprocedural states: Secondary | ICD-10-CM | POA: Diagnosis not present

## 2018-10-22 DIAGNOSIS — Z952 Presence of prosthetic heart valve: Secondary | ICD-10-CM | POA: Insufficient documentation

## 2018-10-22 DIAGNOSIS — R9431 Abnormal electrocardiogram [ECG] [EKG]: Secondary | ICD-10-CM | POA: Diagnosis not present

## 2018-10-22 DIAGNOSIS — I4819 Other persistent atrial fibrillation: Secondary | ICD-10-CM | POA: Diagnosis not present

## 2018-10-22 DIAGNOSIS — E785 Hyperlipidemia, unspecified: Secondary | ICD-10-CM | POA: Diagnosis not present

## 2018-10-22 DIAGNOSIS — Z7901 Long term (current) use of anticoagulants: Secondary | ICD-10-CM | POA: Insufficient documentation

## 2018-10-22 DIAGNOSIS — M810 Age-related osteoporosis without current pathological fracture: Secondary | ICD-10-CM | POA: Diagnosis not present

## 2018-10-22 DIAGNOSIS — Z8249 Family history of ischemic heart disease and other diseases of the circulatory system: Secondary | ICD-10-CM | POA: Diagnosis not present

## 2018-10-22 DIAGNOSIS — I340 Nonrheumatic mitral (valve) insufficiency: Secondary | ICD-10-CM | POA: Insufficient documentation

## 2018-10-22 DIAGNOSIS — Z881 Allergy status to other antibiotic agents status: Secondary | ICD-10-CM | POA: Diagnosis not present

## 2018-10-22 DIAGNOSIS — D649 Anemia, unspecified: Secondary | ICD-10-CM | POA: Insufficient documentation

## 2018-10-22 DIAGNOSIS — I5032 Chronic diastolic (congestive) heart failure: Secondary | ICD-10-CM | POA: Insufficient documentation

## 2018-10-22 DIAGNOSIS — Z79899 Other long term (current) drug therapy: Secondary | ICD-10-CM | POA: Insufficient documentation

## 2018-10-22 DIAGNOSIS — Z885 Allergy status to narcotic agent status: Secondary | ICD-10-CM | POA: Insufficient documentation

## 2018-10-22 DIAGNOSIS — Z888 Allergy status to other drugs, medicaments and biological substances status: Secondary | ICD-10-CM | POA: Diagnosis not present

## 2018-10-22 DIAGNOSIS — I4891 Unspecified atrial fibrillation: Secondary | ICD-10-CM | POA: Diagnosis not present

## 2018-10-22 NOTE — Progress Notes (Signed)
Primary Care Physician: [name], MD Referring Physician: [name] is a 77 y.o. 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 So far, she has been maintaining  SR since the maze.  Today, she denies symptoms of palpitations, chest pain, shortness of breath, orthopnea, PND, lower extremity edema, dizziness, presyncope, syncope, or neurologic sequela. The patient is tolerating medications without difficulties and is otherwise without complaint today.   Past Medical History:  Diagnosis Date  . Acute on chronic diastolic (congestive) heart failure ([place])   . Allergy    See list  . Anemia 2018  . Anxiety    Occasionally take Xanax for sleep  . Anxiety associated with depression    Prn alprazolam   . Anxiety associated with depression   . Arthritis    Hands, Back  . Atrial fibrillation, persistent    DCCV 08/22/2015  . Bradycardia post-op bradycardia, pacer dependent   MDT PPM 11/06/15, Dr. Lovena Le  . Cataract    Left eye  . Diverticulosis   . Heart murmur   . Hemorrhoid   . Hepatic cyst    innumerable  . 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 Hyperlipidemia   . IBS (irritable bowel syndrome)   . 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 Migraine   . MVP (mitral valve prolapse)   . Nodule of right lung   . Osteoporosis of forearm   . RBBB   . Restrictive lung disease    Mild on PFT & likely cardiac in etiology   . S/P Minimally invasive maze operation for atrial fibrillation 10/31/2015   Complete bilateral atrial lesion set using cryothermy and bipolar  radiofrequency ablation with clipping of LA appendage via right mini thoracotomy approach  . S/P minimally invasive mitral valve replacement with bioprosthetic valve 10/31/2015   33 mm [facility] Mitral bovine bioprosthetic tissue valve placed via right mini thoracotomy approach  . Severe mitral regurgitation   . 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 SVT (supraventricular tachycardia) ([place])   . Thoracic aorta atherosclerosis ([place])   . TIA (transient ischemic attack)    Past Surgical History:  Procedure Laterality Date  . BREAST BIOPSY Right Late 80s   Negative  . CARDIAC CATHETERIZATION N/A 10/18/2015   Procedure: Right/Left Heart Cath and Coronary Angiography;  Surgeon: Sherren Mocha, MD;  Location: [place];  Service: Cardiovascular;  Laterality: N/A;  . CARDIOVERSION N/A 08/22/2015   Procedure: CARDIOVERSION;  Surgeon: Thayer Headings, MD;  Location: [facility];  Service: Cardiovascular;  Laterality: N/A;  . COLONOSCOPY  2003  . EP IMPLANTABLE DEVICE N/A 11/06/2015   Procedure: Pacemaker Implant;  Surgeon: Evans Lance, MD;  Location: [place] [facility];  Service: Cardiovascular;  Laterality: N/A;  . EP IMPLANTABLE DEVICE N/A 02/20/2016   Procedure: Lead Extraction;  Surgeon: Evans Lance, MD;  Location: [place];  Service: Cardiovascular;  Laterality: N/A;  . EYE SURGERY Right 2013  . FOOT SURGERY    . MANDIBLE FRACTURE SURGERY  03-26-13  . MINIMALLY INVASIVE MAZE PROCEDURE N/A 10/31/2015  Procedure: MINIMALLY INVASIVE MAZE PROCEDURE;  Surgeon: Rexene Alberts, MD;  Location: [facility];  Service: Open Heart Surgery;  Laterality: N/A;  . MITRAL VALVE REPLACEMENT Right 10/31/2015   Procedure: MINIMALLY INVASIVE MITRAL VALVE (MV) REPLACEMENT;  Surgeon: Rexene Alberts, MD;  Location: [facility];  Service: Open Heart Surgery;  Laterality: Right;  . PACEMAKER LEAD REMOVAL  02/20/2016  . TEE WITH CARDIOVERSION    . TEE WITHOUT CARDIOVERSION N/A 08/22/2015    Procedure: TRANSESOPHAGEAL ECHOCARDIOGRAM (TEE);  Surgeon: Thayer Headings, MD;  Location: [facility];  Service: Cardiovascular;  Laterality: N/A;  . TEE WITHOUT CARDIOVERSION N/A 10/31/2015   Procedure: TRANSESOPHAGEAL ECHOCARDIOGRAM (TEE);  Surgeon: Rexene Alberts, MD;  Location: [facility];  Service: Open Heart Surgery;  Laterality: N/A;  . TUBAL LIGATION    . VARICOSE VEIN SURGERY Right     Current Outpatient Medications  Medication Sig Dispense Refill  . b complex vitamins tablet Take 1 tablet by mouth daily.    . butalbital-acetaminophen-caffeine (FIORICET, ESGIC) 50-325-40 MG tablet TAKE 1 TABLET BY MOUTH AS NEEDED FOR BACK PAIN OR MIGRAINE HEADACHE 90 tablet 5  . Cholecalciferol (VITAMIN D) 2000 units CAPS Take 2,000 Units by mouth daily.    . Coenzyme Q10 (CO Q 10) 100 MG CAPS Take 200 mg by mouth 2 (two) times daily.     Marland Kitchen ELIQUIS 5 MG TABS tablet TAKE ONE TABLET BY MOUTH TWICE DAILY 180 tablet 1  . Ferrous Sulfate (IRON) 325 (65 Fe) MG TABS     . folic acid (FOLVITE) 893 MCG tablet     . furosemide (LASIX) 40 MG tablet Take 40 mg by mouth as directed.    . metoprolol tartrate (LOPRESSOR) 25 MG tablet TAKE ONE-HALF TABLET BY MOUTH TWICE DAILY 90 tablet 3  . montelukast (SINGULAIR) 10 MG tablet Take 10 mg by mouth at bedtime.    . rosuvastatin (CRESTOR) 40 MG tablet TAKE ONE TABLET BY MOUTH EVERY DAY 90 tablet 1  . vitamin B-12 (CYANOCOBALAMIN) 1000 MCG tablet Take 1,000 mcg by mouth daily.    Marland Kitchen conjugated estrogens (PREMARIN) vaginal cream Place 1 Applicatorful vaginally daily. For 2 weeks,  Then two nights per week thereafter 42.5 g 12   No current facility-administered medications for this encounter.     Allergies  Allergen Reactions  . Epinephrine Palpitations  . Meloxicam Other (See Comments)    Severe reflux  . Codeine Nausea And Vomiting    migraine  . Doxycycline Itching and Swelling    Facial  . Hydrocodone Nausea And Vomiting    MIGRAINE  . Hydromorphone  Nausea And Vomiting    migraine  . Oxycodone Nausea And Vomiting    Severe migraine  . Propofol Other (See Comments)    Social History   Socioeconomic History  . Marital status: Widowed    Spouse name: Deceased  . Number of children: 2  . Years of education: 91  . Highest education level: Not on file  Occupational History  . Occupation: Retired  Scientific laboratory technician  . Financial resource strain: Not hard at all  . Food insecurity:    Worry: Never true    Inability: Never true  . Transportation needs:    Medical: No    Non-medical: No  Tobacco Use  . Smoking status: Never Smoker  . Smokeless tobacco: Never Used  Substance and Sexual Activity  . Alcohol use: No    Alcohol/week: 3.0 standard drinks    Types: 3 Standard drinks or  equivalent per week  . Drug use: No  . Sexual activity: Never  Lifestyle  . Physical activity:    Days per week: Not on file    Minutes per session: Not on file  . Stress: Not on file  Relationships  . Social connections:    Talks on phone: Not on file    Gets together: Not on file    Attends religious service: Not on file    Active member of club or organization: Not on file    Attends meetings of clubs or organizations: Not on file    Relationship status: Not on file  . Intimate partner violence:    Fear of current or ex partner: Not on file    Emotionally abused: Not on file    Physically abused: Not on file    Forced sexual activity: Not on file  Other Topics Concern  . Not on file  Social History Narrative   She is a widow.  Husband died from metastatic renal cell cancer. Originally from [place]. Previously lived in [place] from 1975-[place]. Moved to [place] in 2007. No mold exposure recently but did have it through a prior work exposure in 1993. Has a masters in public health. No bird exposure.      [facility] (09/29/17):   She has moved into a retirement community since last appointment. She reports she had testing at her new residence  that was positive for mold. It has since been treated.     Family History  Problem Relation Age of Onset  . Hypertension Mother   . Arrhythmia Mother   . Heart failure Mother   . Arrhythmia Brother   . Stroke Brother 78       cerebral hemorrhage, nonsmoker, no HTN  . Prostate cancer Brother   . Stroke Father        from an aneurysm  . Stroke Maternal Aunt 83       cerebral hemorrhage  . Liver cancer Maternal Grandmother   . Atrial fibrillation Son   . Celiac disease Son   . Heart attack Neg Hx   . Breast cancer Neg Hx     ROS- All systems are reviewed and negative except as per the HPI above  Physical Exam: Vitals:   10/22/18 1345  BP: 124/74  Pulse: 76  Weight: 63 kg  Height: 5\' 5"  (1.651 m)   Wt Readings from Last 3 Encounters:  10/22/18 63 kg  10/06/18 62.6 kg  09/29/18 64.4 kg    Labs: Lab Results  Component Value Date   NA 139 04/07/2018   K 3.9 04/07/2018   CL 103 04/07/2018   CO2 28 04/07/2018   GLUCOSE 89 04/07/2018   BUN 25 (H) 04/07/2018   CREATININE 0.80 09/28/2018   CALCIUM 9.1 04/07/2018   MG 2.0 11/05/2015   Lab Results  Component Value Date   INR 1.11 05/06/2017   Lab Results  Component Value Date   CHOL 144 04/07/2018   HDL 65.50 04/07/2018   LDLCALC 67 04/07/2018   TRIG 59.0 04/07/2018     GEN- The patient is well appearing, alert and oriented x 3 today.   Head- normocephalic, atraumatic Eyes-  Sclera clear, conjunctiva pink Ears- hearing intact Oropharynx- clear Neck- supple, no JVP Lymph- no cervical lymphadenopathy Lungs- Clear to ausculation bilaterally, normal work of breathing Heart- Regular rate and rhythm, no murmurs, rubs or gallops, PMI not laterally displaced GI- soft, NT, ND, + BS Extremities- no clubbing, cyanosis,  or edema MS- no significant deformity or atrophy Skin- no rash or lesion Psych- euthymic mood, full affect Neuro- strength and sensation are intact  EKG- AV dual paced, at 76 bpm, pr int 188 ms,  qrs int 180 ms, qtc 544 ms    Assessment and Plan: 1. S/p  severe mitral regurgitation and atrial fibrillation status post minimally invasive bioprosthetic mitral valve replacement and Maze procedure, clipping of LAA in 2016 Has been enjoying SR since the procedure  Continue BB without change Continue eliquis 5 mg bid   2. PPM Per Dr. Lovena Le  F/u in one year  Geroge Baseman. [place], [facility] [address] [place], [place] [address] [phone]

## 2018-10-26 ENCOUNTER — Ambulatory Visit (INDEPENDENT_AMBULATORY_CARE_PROVIDER_SITE_OTHER): Payer: Medicare Other | Admitting: Family Medicine

## 2018-10-26 ENCOUNTER — Encounter: Payer: Self-pay | Admitting: Family Medicine

## 2018-10-26 VITALS — BP 100/66 | HR 84 | Temp 98.6°F | Ht 65.0 in | Wt 139.0 lb

## 2018-10-26 DIAGNOSIS — R05 Cough: Secondary | ICD-10-CM | POA: Diagnosis not present

## 2018-10-26 DIAGNOSIS — J069 Acute upper respiratory infection, unspecified: Secondary | ICD-10-CM | POA: Diagnosis not present

## 2018-10-26 DIAGNOSIS — R0981 Nasal congestion: Secondary | ICD-10-CM

## 2018-10-26 DIAGNOSIS — R059 Cough, unspecified: Secondary | ICD-10-CM

## 2018-10-26 MED ORDER — BENZONATATE 100 MG PO CAPS: 100.0000 mg | ORAL_CAPSULE | Freq: Three times a day (TID) | ORAL | 0 refills | Status: DC | PRN

## 2018-10-26 NOTE — Patient Instructions (Signed)
Use Tessalon Perles as needed for cough.  Continue to use nasal spray to help with congestion, can also continue using Benadryl over the next couple of nights in addition to use daily Singulair.  Rest, do good handwashing, increase fluid intake.

## 2018-10-26 NOTE — Progress Notes (Signed)
Subjective:    Patient ID: [name] [name], female    DOB: 06-08-1941, 77 y.o.   MRN: [id number]  HPI   Patient presents to clinic complaining of runny nose, body aches, cough, fever, chills, congestion that began on Friday, 10/23/2018.  Patient states on Friday and Saturday she felt so sick that she did not even want to get out of bed.  Patient states she slept all night Friday night and most the day on Saturday.  Patient states she got up to go to the bathroom a few times, a very small meal and went back to bed again for Saturday night.  Patient states on Sunday she woke up with slightly improved symptoms, states she laid around the house, took some Tylenol, use nasal spray and took Benadryl.   Patient states today her symptoms seem somewhat better, still has a little bit of runny nose and cough, body aches seem improved.  Patient still states she feels rundown, but nothing like she felt on Friday night.  Patient Active Problem List   Diagnosis Date Noted  . Intracranial atherosclerosis 10/06/2018  . Hemiplegic migraine without status migrainosus, not intractable 05/14/2018  . Hematuria, gross 04/09/2018  . Arthritis of both hands 12/14/2017  . Allergy to environmental factors 08/27/2017  . Dermatitis due to plants, including poison ivy, sumac, and oak 05/29/2017  . Anemia 02/17/2017  . DOE (dyspnea on exertion) 01/28/2017  . Visit for preventive health examination 05/23/2016  . Hepatic cyst 05/04/2016  . Chronic systolic congestive heart failure ([place]) 05/03/2016  . Peliosis hepatis 02/15/2016  . Prediabetes 02/15/2016  . Pacemaker 11/06/2015  . H/O mitral valve replacement 10/31/2015  . S/P Minimally invasive maze operation for atrial fibrillation 10/31/2015  . Atrial fibrillation, persistent   . Restrictive lung disease 09/12/2015  . Pulmonary hypertension ([place]) 08/08/2015  . Nodule of right lung 08/04/2015  . Thoracic aorta atherosclerosis ([place]) 02/05/2015  . Family history  of cerebral aneurysm 08/02/2014  . Diverticulosis of colon without hemorrhage 01/09/2014  . Thrombocytopenia ([place]) 02/04/2013  . IBS (irritable bowel syndrome) 12/31/2012  . History of asbestos exposure 06/27/2012  . Osteoporosis of forearm 06/27/2012  . Anxiety associated with depression 06/25/2012  . Cataract extraction status of right eye 06/25/2012  . RBBB 05/05/2012  . Hyperlipidemia 08/06/2011  . Mitral valve prolapse 05/02/2011   Social History   Tobacco Use  . Smoking status: Never Smoker  . Smokeless tobacco: Never Used  Substance Use Topics  . Alcohol use: No    Alcohol/week: 3.0 standard drinks    Types: 3 Standard drinks or equivalent per week   Review of Systems  Constitutional: +chills, fatigue and fever.  HENT: +nasal congestion, nasal drainage  Eyes: Negative.   Respiratory: +cough. Negative for shortness of breath and wheezing.   Cardiovascular: Negative for chest pain, palpitations and leg swelling.  Gastrointestinal: Negative for abdominal pain, diarrhea, nausea and vomiting.  Genitourinary: Negative for dysuria, frequency and urgency.  Musculoskeletal: Negative for arthralgias and myalgias.  Skin: Negative for color change, pallor and rash.  Neurological: Negative for syncope, light-headedness and headaches.  Psychiatric/Behavioral: The patient is not nervous/anxious.       Objective:   Physical Exam  Constitutional: She is oriented to person, place, and time. No distress.  Afebrile  HENT:  Head: Normocephalic and atraumatic.  Right Ear: Tympanic membrane, external ear and ear canal normal.  Left Ear: Tympanic membrane, external ear and ear canal normal.  Nose: Mucosal edema and rhinorrhea present.  +  clear-white nasal drainage and some post nasal drip  Eyes: Conjunctivae and EOM are normal. No scleral icterus.  Neck: Neck supple. No tracheal deviation present.  Pulmonary/Chest: Effort normal and breath sounds normal. No respiratory distress. She  has no wheezes. She has no rales.  Neurological: She is alert and oriented to person, place, and time.  Skin: Skin is warm and dry. She is not diaphoretic. No pallor.  Psychiatric: She has a normal mood and affect. Her behavior is normal.      Vitals:   10/26/18 1355  BP: 100/66  Pulse: 84  Temp: 98.6 F (37 C)  SpO2: 98%   Assessment & Plan:   Viral upper respiratory infection, cough, nasal congestion - patient advised she can continue doing what she has been doing to help control symptoms including taking Tylenol for body aches, using a Benadryl and nasal spray to help with congestion, resting.  Patient also given prescription for Tessalon Perles to use as needed for cough.  Discussed with patient that the description of her symptoms coming on strong on Friday night, and slowly resolving over the next few days sounds very viral in nature.  Patient's symptoms are improved today and they should continue to improve over the next few days.  Patient reassured that I feel she does not need an antibiotic at this time and that her symptoms will run their course.  Patient will keep regularly scheduled follow-up with PCP as planned.  Patient aware she can return to clinic sooner if any issues arise.

## 2018-11-03 NOTE — Telephone Encounter (Signed)
Spoke with pt and she stated that she ended up going to [facility] walk in on Sunday and they prescribed her an antibiotic and flonase. She stated that she is has started feeling better.

## 2018-11-03 NOTE — Telephone Encounter (Signed)
LMTCB. Please transfer pt to our office.  

## 2018-11-13 ENCOUNTER — Encounter: Payer: Self-pay | Admitting: Internal Medicine

## 2018-11-13 ENCOUNTER — Ambulatory Visit (INDEPENDENT_AMBULATORY_CARE_PROVIDER_SITE_OTHER): Payer: Medicare Other | Admitting: Internal Medicine

## 2018-11-13 VITALS — BP 100/70 | HR 80 | Temp 97.7°F | Resp 15 | Ht 65.0 in | Wt 139.0 lb

## 2018-11-13 DIAGNOSIS — D509 Iron deficiency anemia, unspecified: Secondary | ICD-10-CM

## 2018-11-13 DIAGNOSIS — E7800 Pure hypercholesterolemia, unspecified: Secondary | ICD-10-CM

## 2018-11-13 DIAGNOSIS — I6529 Occlusion and stenosis of unspecified carotid artery: Secondary | ICD-10-CM | POA: Diagnosis not present

## 2018-11-13 DIAGNOSIS — R569 Unspecified convulsions: Secondary | ICD-10-CM

## 2018-11-13 DIAGNOSIS — I4819 Other persistent atrial fibrillation: Secondary | ICD-10-CM | POA: Diagnosis not present

## 2018-11-13 DIAGNOSIS — M810 Age-related osteoporosis without current pathological fracture: Secondary | ICD-10-CM

## 2018-11-13 DIAGNOSIS — Z79899 Other long term (current) drug therapy: Secondary | ICD-10-CM

## 2018-11-13 LAB — LIPID PANEL
Cholesterol: 149 mg/dL (ref 0–200)
HDL: 55.9 mg/dL (ref >39.00)
LDL Cholesterol: 79 mg/dL (ref 0–99)
NonHDL: 93.48
Total CHOL/HDL Ratio: 3
Triglycerides: 71 mg/dL (ref 0.0–149.0)
VLDL: 14.2 mg/dL (ref 0.0–40.0)

## 2018-11-13 LAB — CBC WITH DIFFERENTIAL/PLATELET
Basophils Absolute: 0.1 10*3/uL (ref 0.0–0.1)
Basophils Relative: 1.1 % (ref 0.0–3.0)
Eosinophils Absolute: 0.2 10*3/uL (ref 0.0–0.7)
Eosinophils Relative: 3 % (ref 0.0–5.0)
HCT: 32.6 % — ABNORMAL LOW (ref 36.0–46.0)
Hemoglobin: 10.9 g/dL — ABNORMAL LOW (ref 12.0–15.0)
Lymphocytes Relative: 30.3 % (ref 12.0–46.0)
Lymphs Abs: 1.8 10*3/uL (ref 0.7–4.0)
MCHC: 33.4 g/dL (ref 30.0–36.0)
MCV: 91.5 fl (ref 78.0–100.0)
Monocytes Absolute: 0.4 10*3/uL (ref 0.1–1.0)
Monocytes Relative: 6.7 % (ref 3.0–12.0)
Neutro Abs: 3.5 10*3/uL (ref 1.4–7.7)
Neutrophils Relative %: 58.9 % (ref 43.0–77.0)
Platelets: 212 10*3/uL (ref 150.0–400.0)
RBC: 3.56 Mil/uL — ABNORMAL LOW (ref 3.87–5.11)
RDW: 14.9 % (ref 11.5–15.5)
WBC: 5.9 10*3/uL (ref 4.0–10.5)

## 2018-11-13 LAB — COMPREHENSIVE METABOLIC PANEL
ALT: 15 U/L (ref 0–35)
AST: 19 U/L (ref 0–37)
Albumin: 3.9 g/dL (ref 3.5–5.2)
Alkaline Phosphatase: 65 U/L (ref 39–117)
BUN: 20 mg/dL (ref 6–23)
CO2: 26 mEq/L (ref 19–32)
Calcium: 9 mg/dL (ref 8.4–10.5)
Chloride: 103 mEq/L (ref 96–112)
Creatinine, Ser: 0.81 mg/dL (ref 0.40–1.20)
GFR: 72.67 mL/min (ref >60.00)
Glucose, Bld: 95 mg/dL (ref 70–99)
Potassium: 3.8 mEq/L (ref 3.5–5.1)
Sodium: 138 mEq/L (ref 135–145)
Total Bilirubin: 0.4 mg/dL (ref 0.2–1.2)
Total Protein: 6.5 g/dL (ref 6.0–8.3)

## 2018-11-13 NOTE — Patient Instructions (Addendum)
To prevent recurrent respiratory infections:   You should try using [employer]'s Sinus rinse on a daily basis when you return home ;  It is a stong sinus "flush" using water and medicated salts.  Do it over the sink because it can be a bit messy   Take Purell and/or Clorox wipes with you wherever you go    You will be able to resume care of your granddaughter after 6 months of seizure free living   Health Maintenance for Postmenopausal Women Menopause is a normal process in which your reproductive ability comes to an end. This process happens gradually over a span of months to years, usually between the ages of 48 and 60. Menopause is complete when you have missed 12 consecutive menstrual periods. It is important to talk with your health care provider about some of the most common conditions that affect postmenopausal women, such as heart disease, cancer, and bone loss (osteoporosis). Adopting a healthy lifestyle and getting preventive care can help to promote your health and wellness. Those actions can also lower your chances of developing some of these common conditions. What should I know about menopause? During menopause, you may experience a number of symptoms, such as:  Moderate-to-severe hot flashes.  Night sweats.  Decrease in sex drive.  Mood swings.  Headaches.  Tiredness.  Irritability.  Memory problems.  Insomnia.  Choosing to treat or not to treat menopausal changes is an individual decision that you make with your health care provider. What should I know about hormone replacement therapy and supplements? Hormone therapy products are effective for treating symptoms that are associated with menopause, such as hot flashes and night sweats. Hormone replacement carries certain risks, especially as you become older. If you are thinking about using estrogen or estrogen with progestin treatments, discuss the benefits and risks with your health care provider. What should I  know about heart disease and stroke? Heart disease, heart attack, and stroke become more likely as you age. This may be due, in part, to the hormonal changes that your body experiences during menopause. These can affect how your body processes dietary fats, triglycerides, and cholesterol. Heart attack and stroke are both medical emergencies. There are many things that you can do to help prevent heart disease and stroke:  Have your blood pressure checked at least every 1-2 years. High blood pressure causes heart disease and increases the risk of stroke.  If you are 51-5 years old, ask your health care provider if you should take aspirin to prevent a heart attack or a stroke.  Do not use any tobacco products, including cigarettes, chewing tobacco, or electronic cigarettes. If you need help quitting, ask your health care provider.  It is important to eat a healthy diet and maintain a healthy weight. ? Be sure to include plenty of vegetables, fruits, low-fat dairy products, and lean protein. ? Avoid eating foods that are high in solid fats, added sugars, or salt (sodium).  Get regular exercise. This is one of the most important things that you can do for your health. ? Try to exercise for at least 150 minutes each week. The type of exercise that you do should increase your heart rate and make you sweat. This is known as moderate-intensity exercise. ? Try to do strengthening exercises at least twice each week. Do these in addition to the moderate-intensity exercise.  Know your numbers.Ask your health care provider to check your cholesterol and your blood glucose. Continue to have your blood tested  as directed by your health care provider.  What should I know about cancer screening? There are several types of cancer. Take the following steps to reduce your risk and to catch any cancer development as early as possible. Breast Cancer  Practice breast self-awareness. ? This means understanding how  your breasts normally appear and feel. ? It also means doing regular breast self-exams. Let your health care provider know about any changes, no matter how small.  If you are 47 or older, have a clinician do a breast exam (clinical breast exam or CBE) every year. Depending on your age, family history, and medical history, it may be recommended that you also have a yearly breast X-ray (mammogram).  If you have a family history of breast cancer, talk with your health care provider about genetic screening.  If you are at high risk for breast cancer, talk with your health care provider about having an MRI and a mammogram every year.  Breast cancer (BRCA) gene test is recommended for women who have family members with BRCA-related cancers. Results of the assessment will determine the need for genetic counseling and BRCA1 and for BRCA2 testing. BRCA-related cancers include these types: ? Breast. This occurs in males or females. ? Ovarian. ? Tubal. This may also be called fallopian tube cancer. ? Cancer of the abdominal or pelvic lining (peritoneal cancer). ? Prostate. ? Pancreatic.  Cervical, Uterine, and Ovarian Cancer Your health care provider may recommend that you be screened regularly for cancer of the pelvic organs. These include your ovaries, uterus, and vagina. This screening involves a pelvic exam, which includes checking for microscopic changes to the surface of your cervix (Pap test).  For women ages 21-65, health care providers may recommend a pelvic exam and a Pap test every three years. For women ages 65-65, they may recommend the Pap test and pelvic exam, combined with testing for human papilloma virus (HPV), every five years. Some types of HPV increase your risk of cervical cancer. Testing for HPV may also be done on women of any age who have unclear Pap test results.  Other health care providers may not recommend any screening for nonpregnant women who are considered low risk for  pelvic cancer and have no symptoms. Ask your health care provider if a screening pelvic exam is right for you.  If you have had past treatment for cervical cancer or a condition that could lead to cancer, you need Pap tests and screening for cancer for at least 20 years after your treatment. If Pap tests have been discontinued for you, your risk factors (such as having a new sexual partner) need to be reassessed to determine if you should start having screenings again. Some women have medical problems that increase the chance of getting cervical cancer. In these cases, your health care provider may recommend that you have screening and Pap tests more often.  If you have a family history of uterine cancer or ovarian cancer, talk with your health care provider about genetic screening.  If you have vaginal bleeding after reaching menopause, tell your health care provider.  There are currently no reliable tests available to screen for ovarian cancer.  Lung Cancer Lung cancer screening is recommended for adults 62-83 years old who are at high risk for lung cancer because of a history of smoking. A yearly low-dose CT scan of the lungs is recommended if you:  Currently smoke.  Have a history of at least 30 pack-years of smoking and you  currently smoke or have quit within the past 15 years. A pack-year is smoking an average of one pack of cigarettes per day for one year.  Yearly screening should:  Continue until it has been 15 years since you quit.  Stop if you develop a health problem that would prevent you from having lung cancer treatment.  Colorectal Cancer  This type of cancer can be detected and can often be prevented.  Routine colorectal cancer screening usually begins at age 82 and continues through age 37.  If you have risk factors for colon cancer, your health care provider may recommend that you be screened at an earlier age.  If you have a family history of colorectal cancer, talk  with your health care provider about genetic screening.  Your health care provider may also recommend using home test kits to check for hidden blood in your stool.  A small camera at the end of a tube can be used to examine your colon directly (sigmoidoscopy or colonoscopy). This is done to check for the earliest forms of colorectal cancer.  Direct examination of the colon should be repeated every 5-10 years until age 13. However, if early forms of precancerous polyps or small growths are found or if you have a family history or genetic risk for colorectal cancer, you may need to be screened more often.  Skin Cancer  Check your skin from head to toe regularly.  Monitor any moles. Be sure to tell your health care provider: ? About any new moles or changes in moles, especially if there is a change in a mole's shape or color. ? If you have a mole that is larger than the size of a pencil eraser.  If any of your family members has a history of skin cancer, especially at a young age, talk with your health care provider about genetic screening.  Always use sunscreen. Apply sunscreen liberally and repeatedly throughout the day.  Whenever you are outside, protect yourself by wearing long sleeves, pants, a wide-brimmed hat, and sunglasses.  What should I know about osteoporosis? Osteoporosis is a condition in which bone destruction happens more quickly than new bone creation. After menopause, you may be at an increased risk for osteoporosis. To help prevent osteoporosis or the bone fractures that can happen because of osteoporosis, the following is recommended:  If you are 62-77 years old, get at least 1,000 mg of calcium and at least 600 mg of vitamin D per day.  If you are older than age 63 but younger than age 88, get at least 1,200 mg of calcium and at least 600 mg of vitamin D per day.  If you are older than age 42, get at least 1,200 mg of calcium and at least 800 mg of vitamin D per  day.  Smoking and excessive alcohol intake increase the risk of osteoporosis. Eat foods that are rich in calcium and vitamin D, and do weight-bearing exercises several times each week as directed by your health care provider. What should I know about how menopause affects my mental health? Depression may occur at any age, but it is more common as you become older. Common symptoms of depression include:  Low or sad mood.  Changes in sleep patterns.  Changes in appetite or eating patterns.  Feeling an overall lack of motivation or enjoyment of activities that you previously enjoyed.  Frequent crying spells.  Talk with your health care provider if you think that you are experiencing depression. What  should I know about immunizations? It is important that you get and maintain your immunizations. These include:  Tetanus, diphtheria, and pertussis (Tdap) booster vaccine.  Influenza every year before the flu season begins.  Pneumonia vaccine.  Shingles vaccine.  Your health care provider may also recommend other immunizations. This information is not intended to replace advice given to you by your health care provider. Make sure you discuss any questions you have with your health care provider. Document Released: 01/10/2006 Document Revised: 06/07/2016 Document Reviewed: 08/22/2015 Elsevier Interactive Patient Education  2018 [employer].

## 2018-11-13 NOTE — Progress Notes (Signed)
Patient ID: [name] [name], female    DOB: 1941-02-22  Age: 77 y.o. MRN: [id number]  The patient is here for annual follow up and management of other chronic and acute problems.     The risk factors are reflected in the social history.  The roster of all physicians providing medical care to patient - is listed in the Snapshot section of the chart.  Activities of daily living:  The patient is 100% independent in all ADLs: dressing, toileting, feeding as well as independent mobility  Home safety : The patient has smoke detectors in the home. They wear seatbelts.  There are no firearms at home. There is no violence in the home.   There is no risks for hepatitis, STDs or HIV. There is no   history of blood transfusion. They have no travel history to infectious disease endemic areas of the world.  The patient has seen their dentist in the last six month. They have seen their eye doctor in the last year. They admit to slight hearing difficulty with regard to whispered voices and some television programs.  They have deferred audiologic testing in the last year.  They do not  have excessive sun exposure. Discussed the need for sun protection: hats, long sleeves and use of sunscreen if there is significant sun exposure.   Diet: the importance of a healthy diet is discussed. They do have a healthy diet.  The benefits of regular aerobic exercise were discussed. She walks 4 times per week ,  20 minutes.   Depression screen: there are no signs or vegative symptoms of depression- irritability, change in appetite, anhedonia, sadness/tearfullness.  Cognitive assessment: the patient manages all their financial and personal affairs and is actively engaged. They could relate day,date,year and events; recalled 2/3 objects at 3 minutes; performed clock-face test normally.  The following portions of the patient's history were reviewed and updated as appropriate: allergies, current medications, past family  history, past medical history,  past surgical history, past social history  and problem list.  Visual acuity was not assessed per patient preference since she has regular follow up with her ophthalmologist. Hearing and body mass index were assessed and reviewed.   During the course of the visit the patient was educated and counseled about appropriate screening and preventive services including : fall prevention , diabetes screening, nutrition counseling, colorectal cancer screening, and recommended immunizations.    CC: The primary encounter diagnosis was Pure hypercholesterolemia. Diagnoses of Long-term use of high-risk medication, Atrial fibrillation, persistent, Focal seizures ([place]), and Osteoporosis of forearm were also pertinent to this visit.  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 Two viral illnesses ,  A month apart  High fevers in November,  Had persistent symptoms  That progressed to sinusitis ,  Treated with omnicef and Flonase by [facility], another seizure    Prescribed lamictal started it last Friday.   Has not had any episodes in a week.recurrent focal seizures brought on by illness and benadryl.   emotionally devastated by the diagnosis due to it limiting her ability to babysit her special needs daughter    2) Also diagnosed with atherosclerosis of cerebral arteries  And carotid atherosclerosis [place] annual ultrasound of carotids   History [name] [name] has a past medical history of Acute on chronic diastolic (congestive) heart failure ([place]), Allergy, Anemia (2018), Anxiety,  Anxiety associated with depression, Anxiety associated with depression, Arthritis, Atrial fibrillation, persistent, Bradycardia (post-op bradycardia, pacer dependent), Cataract, Diverticulosis, Heart murmur, Hemorrhoid, Hepatic cyst, History of asbestos exposure, Hyperlipidemia, IBS  (irritable bowel syndrome), Idiopathic thrombocytopenic purpura (ITP) (HCC), Migraine, MVP (mitral valve prolapse), Nodule of right lung, Osteoporosis of forearm, RBBB, Restrictive lung disease, S/P Minimally invasive maze operation for atrial fibrillation (10/31/2015), S/P minimally invasive mitral valve replacement with bioprosthetic valve (10/31/2015), Severe mitral regurgitation, Skin cancer, basal cell (1991), SVT (supraventricular tachycardia) ([place]), Thoracic aorta atherosclerosis ([place]), and TIA (transient ischemic attack).   She has a past surgical history that includes Tubal ligation; Foot surgery; Varicose vein surgery (Right); Mandible fracture surgery (03-26-13); Colonoscopy (2003); Eye surgery (Right, 2013); Cardioversion (N/A, 08/22/2015); TEE without cardioversion (N/A, 08/22/2015); TEE with cardioversion; Cardiac catheterization (N/A, 10/18/2015); Minimally invasive maze procedure (N/A, 10/31/2015); TEE without cardioversion (N/A, 10/31/2015); Mitral valve replacement (Right, 10/31/2015); Cardiac catheterization (N/A, 11/06/2015); Pacemaker lead removal (02/20/2016); Cardiac catheterization (N/A, 02/20/2016); and Breast biopsy (Right, Late 80s).   Her family history includes Arrhythmia in her brother and mother; Atrial fibrillation in her son; Celiac disease in her son; Heart failure in her mother; Hypertension in her mother; Liver cancer in her maternal grandmother; Prostate cancer in her brother; Stroke in her father; Stroke (age of onset: 23) in her brother; Stroke (age of onset: 70) in her maternal aunt.She reports that she has never smoked. She has never used smokeless tobacco. She reports that she does not drink alcohol or use drugs.  Outpatient Medications Prior to Visit  Medication Sig Dispense Refill  . b complex vitamins tablet Take 1 tablet by mouth daily.    . butalbital-acetaminophen-caffeine (FIORICET, ESGIC) 50-325-40 MG tablet TAKE 1 TABLET BY MOUTH AS NEEDED FOR BACK PAIN OR  MIGRAINE HEADACHE 90 tablet 5  . Cholecalciferol (VITAMIN D) 2000 units CAPS Take 2,000 Units by mouth daily.    . Coenzyme Q10 (CO Q 10) 100 MG CAPS Take 200 mg by mouth 2 (two) times daily.     Marland Kitchen conjugated estrogens (PREMARIN) vaginal cream Place 1 Applicatorful vaginally daily. For 2 weeks,  Then two nights per week thereafter 42.5 g 12  . ELIQUIS 5 MG TABS tablet TAKE ONE TABLET BY MOUTH TWICE DAILY 180 tablet 1  . Ferrous Sulfate (IRON) 325 (65 Fe) MG TABS     . folic acid (FOLVITE) 431 MCG tablet     . furosemide (LASIX) 40 MG tablet Take 40 mg by mouth as directed.    . lamoTRIgine (LAMICTAL) 100 MG tablet     . metoprolol tartrate (LOPRESSOR) 25 MG tablet TAKE ONE-HALF TABLET BY MOUTH TWICE DAILY 90 tablet 3  . montelukast (SINGULAIR) 10 MG tablet Take 10 mg by mouth at bedtime.    . rosuvastatin (CRESTOR) 40 MG tablet TAKE ONE TABLET BY MOUTH EVERY DAY 90 tablet 1  . benzonatate (TESSALON PERLES) 100 MG capsule Take 1 capsule (100 mg total) by mouth 3 (three) times daily as needed for cough. 30 capsule 0  . vitamin B-12 (CYANOCOBALAMIN) 1000 MCG tablet Take 1,000 mcg by mouth daily.     No facility-administered medications prior to visit.     Review of Systems   Patient denies headache, fevers, malaise, unintentional weight loss, skin rash, eye pain, sinus congestion and sinus pain, sore throat, dysphagia,  hemoptysis , cough, dyspnea, wheezing, chest pain, palpitations, orthopnea, edema, abdominal pain, nausea, melena, diarrhea, constipation, flank pain, dysuria, hematuria, urinary  Frequency, nocturia, numbness, tingling, seizures,  Focal weakness,  Loss of consciousness,  Tremor, insomnia, depression, anxiety, and suicidal ideation.      Objective:  BP 100/70 (BP Location: Left Arm, Patient Position: Sitting, Cuff Size: Normal)   Pulse 80   Temp 97.7 F (36.5 C) (Oral)   Resp 15   Ht 5\' 5"  (1.651 m)   Wt 139 lb (63 kg)   SpO2 99%   BMI 23.13 kg/m   Physical  Exam  General appearance: alert, cooperative and appears stated age Head: Normocephalic, without obvious abnormality, atraumatic Eyes: conjunctivae/corneas clear. PERRL, EOM's intact. Fundi benign. Ears: normal TM's and external ear canals both ears Nose: Nares normal. Septum midline. Mucosa normal. No drainage or sinus tenderness. Throat: lips, mucosa, and tongue normal; teeth and gums normal Neck: no adenopathy, no carotid bruit, no JVD, supple, symmetrical, trachea midline and thyroid not enlarged, symmetric, no tenderness/mass/nodules Lungs: clear to auscultation bilaterally Breasts: normal appearance, no masses or tenderness Heart: regular rate and rhythm, S1, S2 normal, no murmur, click, rub or gallop Abdomen: soft, non-tender; bowel sounds normal; no masses,  no organomegaly Extremities: extremities normal, atraumatic, no cyanosis or edema Pulses: 2+ and symmetric Skin: Skin color, texture, turgor normal. No rashes or lesions Neurologic: Alert and oriented X 3, normal strength and tone. Normal symmetric reflexes. Normal coordination and gait.    Assessment & Plan:   Problem List Items Addressed This Visit    Atrial fibrillation, persistent    currently rate controlled and has no signs of heart failure on exam.  Tolerating Eliquis for mitigation of embolic stroke risk       Focal seizures ([place])    Diagnosed by Dr Manuella Ghazi,  Triggered by medications and acute illness,  Now managed with Lamictal       Relevant Medications   lamoTRIgine (LAMICTAL) 100 MG tablet   Hyperlipidemia - Primary (Chronic)    LDL Is at goal .  LFTS are normal. .mild thrombocytopenia was unchanged with suspension of statin.    Continue Crestor.  Lab Results  Component Value Date   CHOL 149 11/13/2018   HDL 55.90 11/13/2018   LDLCALC 79 11/13/2018   LDLDIRECT 113.0 02/15/2016   TRIG 71.0 11/13/2018   CHOLHDL 3 11/13/2018   Lab Results  Component Value Date   ALT 15 11/13/2018   AST 19 11/13/2018    ALKPHOS 65 11/13/2018   BILITOT 0.4 11/13/2018         Relevant Orders   Lipid panel (Completed)   Osteoporosis of forearm    Managed with Prolia.  T scores are unchanged.         Other Visit Diagnoses    Long-term use of high-risk medication       Relevant Orders   Comprehensive metabolic panel (Completed)   CBC with Differential/Platelet (Completed)     A total of 40 minutes was spent with patient more than half of which was spent in counseling patient on the above mentioned issues , reviewing and explaining recent labs and imaging studies done, and coordination of care.  I have discontinued Gypsy Lore. Andalon's vitamin B-12 and benzonatate. I am also having her maintain her Vitamin D, Co Q 10, b complex vitamins, furosemide, rosuvastatin, Iron, folic acid, montelukast, conjugated estrogens, ELIQUIS, metoprolol tartrate, butalbital-acetaminophen-caffeine, and lamoTRIgine.  No orders of the defined types were placed in this encounter.   Medications Discontinued During This Encounter  Medication Reason  . vitamin B-12 (CYANOCOBALAMIN) 1000 MCG tablet Error  . benzonatate (TESSALON PERLES) 100 MG capsule Error  Follow-up: Return in about 6 months (around 05/15/2019).   [name], MD

## 2018-11-15 NOTE — Assessment & Plan Note (Signed)
Managed with Prolia.  T scores are unchanged.

## 2018-11-15 NOTE — Assessment & Plan Note (Signed)
Diagnosed by Dr Manuella Ghazi,  Triggered by medications and acute illness,  Now managed with Lamictal

## 2018-11-15 NOTE — Assessment & Plan Note (Signed)
LDL Is at goal .  LFTS are normal. .mild thrombocytopenia was unchanged with suspension of statin.    Continue Crestor.  Lab Results  Component Value Date   CHOL 149 11/13/2018   HDL 55.90 11/13/2018   LDLCALC 79 11/13/2018   LDLDIRECT 113.0 02/15/2016   TRIG 71.0 11/13/2018   CHOLHDL 3 11/13/2018   Lab Results  Component Value Date   ALT 15 11/13/2018   AST 19 11/13/2018   ALKPHOS 65 11/13/2018   BILITOT 0.4 11/13/2018

## 2018-11-15 NOTE — Assessment & Plan Note (Signed)
currently rate controlled and has no signs of heart failure on exam.  Tolerating Eliquis for mitigation of embolic stroke risk  °

## 2018-11-15 NOTE — Assessment & Plan Note (Signed)
Mild. Normocytic.  FOBT negative,  iron stores normalized.  Etiology multifactorial (iron and  B12)

## 2018-11-16 ENCOUNTER — Other Ambulatory Visit: Payer: Self-pay | Admitting: Cardiovascular Disease

## 2018-11-16 NOTE — Telephone Encounter (Signed)
Pt is a 77 yr old female who saw Afib clinic on 10/22/18. Weight at that visit was 63 Kg. Last SCr on 11/13/18 was 0.81. Will refill Eliquis 5mg  BID under Dr. Lovena Le.

## 2018-12-01 ENCOUNTER — Ambulatory Visit (INDEPENDENT_AMBULATORY_CARE_PROVIDER_SITE_OTHER): Payer: Medicare Other

## 2018-12-01 DIAGNOSIS — I442 Atrioventricular block, complete: Secondary | ICD-10-CM

## 2018-12-01 LAB — CUP PACEART REMOTE DEVICE CHECK
Battery Impedance: 208 Ohm
Battery Remaining Longevity: 112 mo
Battery Voltage: 2.8 V
Brady Statistic AP VP Percent: 85 %
Brady Statistic AP VS Percent: 0 %
Brady Statistic AS VP Percent: 15 %
Brady Statistic AS VS Percent: 0 %
Date Time Interrogation Session: 20191231191146
Implantable Lead Implant Date: 20161205
Implantable Lead Implant Date: 20170321
Implantable Lead Location: 753859
Implantable Lead Location: 753860
Implantable Lead Model: 5076
Implantable Lead Model: 5076
Implantable Pulse Generator Implant Date: 20161205
Lead Channel Impedance Value: 486 Ohm
Lead Channel Impedance Value: 803 Ohm
Lead Channel Pacing Threshold Amplitude: 0.375 V
Lead Channel Pacing Threshold Amplitude: 0.625 V
Lead Channel Pacing Threshold Pulse Width: 0.4 ms
Lead Channel Pacing Threshold Pulse Width: 0.4 ms
Lead Channel Setting Pacing Amplitude: 2 V
Lead Channel Setting Pacing Amplitude: 2.5 V
Lead Channel Setting Pacing Pulse Width: 0.4 ms
Lead Channel Setting Sensing Sensitivity: 4 mV

## 2018-12-03 NOTE — Progress Notes (Signed)
Remote pacemaker transmission.   

## 2018-12-07 ENCOUNTER — Ambulatory Visit: Payer: Medicare Other | Admitting: Cardiovascular Disease

## 2018-12-08 NOTE — Progress Notes (Signed)
Cardiology Office Note:    Date:  12/09/2018   ID:  Arlys, Scatena [birth year], MRN [id number]  PCP:  [name], MD  Cardiologist:  Mertie Moores, MD   Electrophysiologist:  Cristopher Peru, MD   Referring MD: [name], MD   Chief Complaint  Patient presents with  . Follow-up    AFib, CHF, Hx of MVR    History of Present Illness:    [name] [name] is a 78 y.o. 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  Last seen in 7/19.     [name] [name] returns for follow up.  [name] [name] is here alone.  [name] [name] has been diagnosed with a seizure disorder that has been controlled on Lamictal.  [name] [name] sees Neurology at [facility] (Dr. Jennings Books).  [name] [name] has not had any chest pain, significant shortness of breath, syncope, paroxysmal nocturnal dyspnea, leg swelling.   [name] [name] rarely takes Furosemide.    Prior CV studies:   The following studies were reviewed today:  Head CTA 09/28/18 IMPRESSION: No significant carotid or vertebral artery stenosis in the neck. 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  Mild stenosis distal basilar unchanged. No other significant intracranial stenosis.  Echocardiogram 02/12/2017 EF 40-45, anteroseptal, inferior, inferoseptal hypokinesis, normally functioning mitral valve bioprosthesis (mean 5), severe LAE, normal RVSF, mild TR, PASP 40  Echocardiogram 03/04/2016 EF 35-40, diffuse hypokinesis, normally functioning mitral valve bioprosthesis, moderate LAE, moderate RV dilation, mildly reduced RVSF  Carotid US 10/27/2015 Bilateral ICA 1-39  Cardiac catheterization  10/18/2015 Normal coronary arteries, EF >65, 4+ mitral regurgitation with mitral valve prolapse  Past Medical History:  Diagnosis Date  . Acute on chronic diastolic (congestive) heart failure ([place])   . Allergy    See list  . Anemia 2018  . Anxiety    Occasionally take Xanax for sleep  . Anxiety associated with depression    Prn alprazolam   . Anxiety associated with depression   . Arthritis    Hands, Back  . Atrial fibrillation, persistent    DCCV 08/22/2015  . Bradycardia post-op bradycardia, pacer dependent   MDT PPM 11/06/15, Dr. Lovena Le  . Cataract    Left eye  . Diverticulosis   . Focal seizures ([place])   . Heart murmur   . Hemorrhoid   . Hepatic cyst    innumerable  . 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 Hyperlipidemia   . IBS (irritable bowel syndrome)   . 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 Migraine   . MVP (mitral valve prolapse)   . Nodule of right lung   . Osteoporosis of forearm   . RBBB   . Restrictive lung disease    Mild on PFT & likely cardiac in etiology   . S/P Minimally invasive maze operation for atrial fibrillation 10/31/2015   Complete bilateral atrial lesion set using cryothermy and bipolar radiofrequency ablation with clipping of LA appendage via right mini thoracotomy approach  . S/P minimally invasive mitral valve replacement with bioprosthetic valve 10/31/2015   33 mm [facility] Mitral bovine bioprosthetic tissue valve placed via right mini thoracotomy approach  . Severe mitral regurgitation   . 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 SVT (supraventricular tachycardia) ([place])   . Thoracic aorta atherosclerosis ([place])   . TIA (transient ischemic attack)    Surgical Hx: The patient  has a past surgical history that includes Tubal ligation; Foot surgery; Varicose vein surgery (Right); Mandible fracture surgery (03-26-13); Colonoscopy (2003); Eye surgery (Right, 2013); Cardioversion (N/A, 08/22/2015); TEE without  cardioversion (N/A, 08/22/2015); TEE with cardioversion; Cardiac catheterization (N/A, 10/18/2015); Minimally invasive maze procedure (N/A, 10/31/2015); TEE without cardioversion (N/A, 10/31/2015); Mitral valve replacement (Right, 10/31/2015); Cardiac catheterization (N/A, 11/06/2015); Pacemaker lead removal (02/20/2016); Cardiac catheterization (N/A, 02/20/2016); and Breast biopsy (Right, Late 80s).   Current Medications: Current Meds  Medication Sig  . b complex vitamins tablet Take 1 tablet by mouth daily.  . butalbital-acetaminophen-caffeine (FIORICET, ESGIC) 50-325-40 MG tablet TAKE 1 TABLET BY MOUTH AS NEEDED FOR BACK PAIN OR MIGRAINE HEADACHE  . Cholecalciferol (VITAMIN D) 2000 units CAPS Take 2,000 Units by mouth daily.  Marland Kitchen conjugated estrogens (PREMARIN) vaginal cream Place 1 Applicatorful vaginally daily. For 2 weeks,  Then two nights per week thereafter  . ELIQUIS 5 MG TABS tablet TAKE ONE TABLET BY MOUTH TWICE DAILY  . Ferrous Sulfate (IRON) 325 (65 Fe) MG TABS   . folic acid (FOLVITE) 300 MCG tablet   . furosemide (LASIX) 40 MG tablet Take 40 mg by mouth as directed.  . lamoTRIgine (LAMICTAL) 100 MG tablet   . metoprolol tartrate (LOPRESSOR) 25 MG tablet TAKE ONE-HALF TABLET BY MOUTH TWICE DAILY  . montelukast (SINGULAIR) 10 MG tablet Take 10 mg by mouth at bedtime.  . rosuvastatin (CRESTOR) 40 MG tablet TAKE ONE TABLET BY MOUTH EVERY DAY     Allergies:   Epinephrine; Meloxicam; Codeine; Dextromethorphan; Diphenhydramine; Doxycycline; Hydrocodone; Hydromorphone; Oxycodone; and Propofol   Social History   Tobacco Use  . Smoking status: Never Smoker  . Smokeless tobacco: Never Used  Substance Use Topics  . Alcohol use: No    Alcohol/week: 3.0 standard drinks    Types: 3 Standard drinks or equivalent per week  . Drug use: No     Family Hx: The patient's family history includes Arrhythmia in her brother and mother; Atrial fibrillation in her son; Celiac disease in her son;  Heart failure in her mother; Hypertension in her mother; Liver cancer in her maternal grandmother; Prostate cancer in her brother; Stroke in her father; Stroke (age of onset: 82) in her brother; Stroke (age of onset: 73) in her maternal aunt. There is no history of Heart attack or Breast cancer.  ROS:   Please see the history of present illness.    ROS All other systems reviewed and are negative.   EKGs/Labs/Other Test Reviewed:    EKG:  EKG is   ordered today.  The ekg ordered today demonstrates AV paced, HR 78  Recent Labs: 04/07/2018: TSH 4.05 11/13/2018: ALT 15; BUN 20; Creatinine, Ser 0.81; Hemoglobin 10.9; Platelets 212.0; Potassium 3.8; Sodium 138   Recent Lipid Panel Lab Results  Component Value Date/Time   CHOL 149 11/13/2018 09:19 AM   CHOL 162 12/05/2014 08:48 AM   TRIG 71.0 11/13/2018 09:19 AM   HDL 55.90 11/13/2018 09:19 AM   HDL 61 12/05/2014 08:48 AM   CHOLHDL 3 11/13/2018 09:19 AM   LDLCALC 79 11/13/2018 09:19 AM   LDLCALC 85 12/05/2014 08:48 AM   LDLDIRECT 113.0 02/15/2016 09:30 AM    Physical Exam:    VS:  BP 118/66   Pulse 78   Ht 5\' 5"  (1.651 m)  Wt 140 lb 12.8 oz (63.9 kg)   BMI 23.43 kg/m     Wt Readings from Last 3 Encounters:  12/09/18 140 lb 12.8 oz (63.9 kg)  11/13/18 139 lb (63 kg)  10/26/18 139 lb (63 kg)     Physical Exam  Constitutional: [name] [name] is oriented to person, place, and time. [name] [name] appears well-developed and well-nourished. No distress.  HENT:  Head: Normocephalic and atraumatic.  Neck: Neck supple. No JVD present.  Cardiovascular: Normal rate, regular rhythm, S1 normal, S2 normal and normal heart sounds.  No murmur heard. Pulmonary/Chest: Effort normal. [name] [name] has no rales.  Abdominal: Soft. There is no hepatomegaly.  Musculoskeletal:        General: No edema.  Neurological: [name] [name] is alert and oriented to person, place, and time.  Skin: Skin is warm and dry.    ASSESSMENT & PLAN:    Atrial fibrillation, persistent S/p Maze  procedure in 2016.  Maintaining NSR.   Continue Apixaban.  Recent BMET, CBC ok.  Chronic systolic congestive heart failure (HCC) EF 40-45.  [name] [name] is NYHA 2.  Volume status stable.  Continue beta-blocker.  Her BP has now allowed initiation of ACE inhibitor in the past.   S/P MVR (mitral valve replacement)   Normal function by echo in 2018.  Continue SBE prophylaxis.     Dispo:  Return in about 6 months (around 06/09/2019) for Routine Follow Up, w/ Dr. Acie Fredrickson, or Richardson Dopp, PA-C.   Medication Adjustments/Labs and Tests Ordered: Current medicines are reviewed at length with the patient today.  Concerns regarding medicines are outlined above.  Tests Ordered: Orders Placed This Encounter  Procedures  . EKG 12-Lead   Medication Changes: No orders of the defined types were placed in this encounter.   Signed, Richardson Dopp, PA-C  12/09/2018 1:16 PM    [facility] [place], [place], [place]  [address] Phone: [phone]; Fax: [phone]

## 2018-12-09 ENCOUNTER — Encounter: Payer: Self-pay | Admitting: Physician Assistant

## 2018-12-09 ENCOUNTER — Ambulatory Visit (INDEPENDENT_AMBULATORY_CARE_PROVIDER_SITE_OTHER): Payer: Medicare Other | Admitting: Physician Assistant

## 2018-12-09 VITALS — BP 118/66 | HR 78 | Ht 65.0 in | Wt 140.8 lb

## 2018-12-09 DIAGNOSIS — I4819 Other persistent atrial fibrillation: Secondary | ICD-10-CM

## 2018-12-09 DIAGNOSIS — I5022 Chronic systolic (congestive) heart failure: Secondary | ICD-10-CM

## 2018-12-09 DIAGNOSIS — Z952 Presence of prosthetic heart valve: Secondary | ICD-10-CM | POA: Diagnosis not present

## 2018-12-09 NOTE — Patient Instructions (Signed)
Medication Instructions:  Your physician recommends that you continue on your current medications as directed. Please refer to the Current Medication list given to you today.  If you need a refill on your cardiac medications before your next appointment, please call your pharmacy.   Lab work: NONE If you have labs (blood work) drawn today and your tests are completely normal, you will receive your results only by: . MyChart Message (if you have MyChart) OR . A paper copy in the mail If you have any lab test that is abnormal or we need to change your treatment, we will call you to review the results.  Testing/Procedures: NONE  Follow-Up: At [facility], you and your health needs are our priority.  As part of our continuing mission to provide you with exceptional heart care, we have created designated Provider Care Teams.  These Care Teams include your primary Cardiologist (physician) and Advanced Practice Providers (APPs -  Physician Assistants and Nurse Practitioners) who all work together to provide you with the care you need, when you need it. You will need a follow up appointment in:  6 months.  Please call our office 2 months in advance to schedule this appointment.  You may see Philip Nahser, MD or one of the following Advanced Practice Providers on your designated Care Team: Scott Weaver, PA-C Vin Bhagat, PA-C . Janine Hammond, NP  Any Other Special Instructions Will Be Listed Below (If Applicable).    

## 2019-01-18 NOTE — Telephone Encounter (Signed)
I would suggest she be evaluated. Please offer her an appointment. Thanks.

## 2019-01-19 NOTE — Telephone Encounter (Signed)
Spkoe with pt and she stated that she has an appt scheduled for tomorrow to see Dr. Aundra Dubin.

## 2019-01-20 ENCOUNTER — Ambulatory Visit (INDEPENDENT_AMBULATORY_CARE_PROVIDER_SITE_OTHER): Payer: Medicare Other | Admitting: Internal Medicine

## 2019-01-20 ENCOUNTER — Encounter: Payer: Self-pay | Admitting: Internal Medicine

## 2019-01-20 VITALS — BP 96/62 | HR 81 | Temp 97.8°F | Ht 65.0 in | Wt 138.2 lb

## 2019-01-20 DIAGNOSIS — B356 Tinea cruris: Secondary | ICD-10-CM

## 2019-01-20 MED ORDER — FLUCONAZOLE 150 MG PO TABS: 150.0000 mg | ORAL_TABLET | Freq: Once | ORAL | 0 refills | Status: AC

## 2019-01-20 MED ORDER — MICONAZOLE NITRATE 2 % EX CREA: 1.0000 "application " | TOPICAL_CREAM | Freq: Two times a day (BID) | CUTANEOUS | 0 refills | Status: DC

## 2019-01-20 NOTE — Patient Instructions (Signed)

## 2019-01-20 NOTE — Progress Notes (Signed)
Chief Complaint  Patient presents with  . Acute Visit    lesion on left calf   Left leg red spot itchy and scaly started 01/06/2019 as rough spot, 01/11/19 tried lotriman x 4 days, Sunday 01/19/2019 dime size to penny size now less than quarter size but not itching. No pets lives alone. She did go swimming at [facility] 2.5 weeks ago before the rash. She does do yoga on her yoga mat last 2 weeks ago and does yoga on carpet at [facility] which is in auditorium no carpet.  appt derm Dr. Kellie Moor 03/2019    Review of Systems  Eyes: Negative for blurred vision.  Respiratory: Negative for shortness of breath.   Cardiovascular: Negative for chest pain.  Skin: Positive for itching and rash.  Psychiatric/Behavioral: Negative for memory loss.   Past Medical History:  Diagnosis Date  . Acute on chronic diastolic (congestive) heart failure ([place])   . Allergy    See list  . Anemia 2018  . Anxiety    Occasionally take Xanax for sleep  . Anxiety associated with depression    Prn alprazolam   . Anxiety associated with depression   . Arthritis    Hands, Back  . Atrial fibrillation, persistent    DCCV 08/22/2015  . Bradycardia post-op bradycardia, pacer dependent   MDT PPM 11/06/15, Dr. Lovena Le  . Cataract    Left eye  . Diverticulosis   . Focal seizures ([place])   . Heart murmur   . Hemorrhoid   . Hepatic cyst    innumerable  . 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 Hyperlipidemia   . IBS (irritable bowel syndrome)   . 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 Migraine   . MVP (mitral valve prolapse)   . Nodule of right lung   . Osteoporosis of forearm   . RBBB   . Restrictive lung disease    Mild on PFT & likely cardiac in etiology   . S/P Minimally invasive maze operation for atrial fibrillation 10/31/2015   Complete bilateral atrial lesion set using cryothermy and bipolar radiofrequency ablation with clipping of LA appendage via right mini thoracotomy approach  . S/P minimally  invasive mitral valve replacement with bioprosthetic valve 10/31/2015   33 mm [facility] Mitral bovine bioprosthetic tissue valve placed via right mini thoracotomy approach  . Severe mitral regurgitation   . 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 SVT (supraventricular tachycardia) ([place])   . Thoracic aorta atherosclerosis ([place])   . TIA (transient ischemic attack)    Past Surgical History:  Procedure Laterality Date  . BREAST BIOPSY Right Late 80s   Negative  . CARDIAC CATHETERIZATION N/A 10/18/2015   Procedure: Right/Left Heart Cath and Coronary Angiography;  Surgeon: Sherren Mocha, MD;  Location: [place] [facility];  Service: Cardiovascular;  Laterality: N/A;  . CARDIOVERSION N/A 08/22/2015   Procedure: CARDIOVERSION;  Surgeon: Thayer Headings, MD;  Location: [facility];  Service: Cardiovascular;  Laterality: N/A;  . COLONOSCOPY  2003  . EP IMPLANTABLE DEVICE N/A 11/06/2015   Procedure: Pacemaker Implant;  Surgeon: Evans Lance, MD;  Location: [place] [facility];  Service: Cardiovascular;  Laterality: N/A;  . EP IMPLANTABLE DEVICE N/A 02/20/2016   Procedure: Lead Extraction;  Surgeon: Evans Lance, MD;  Location: [place] [facility];  Service: Cardiovascular;  Laterality: N/A;  . EYE SURGERY Right 2013  . FOOT SURGERY    . MANDIBLE FRACTURE SURGERY  03-26-13  . MINIMALLY INVASIVE MAZE PROCEDURE N/A 10/31/2015   Procedure: MINIMALLY INVASIVE MAZE PROCEDURE;  Surgeon: Rexene Alberts, MD;  Location: [place];  Service: Open Heart Surgery;  Laterality: N/A;  . MITRAL VALVE REPLACEMENT Right 10/31/2015   Procedure: MINIMALLY INVASIVE MITRAL VALVE (MV) REPLACEMENT;  Surgeon: Rexene Alberts, MD;  Location: [place];  Service: Open Heart Surgery;  Laterality: Right;  . PACEMAKER LEAD REMOVAL  02/20/2016  . TEE WITH CARDIOVERSION    . TEE WITHOUT CARDIOVERSION N/A 08/22/2015   Procedure: TRANSESOPHAGEAL ECHOCARDIOGRAM (TEE);  Surgeon: Thayer Headings, MD;  Location: [facility];   Service: Cardiovascular;  Laterality: N/A;  . TEE WITHOUT CARDIOVERSION N/A 10/31/2015   Procedure: TRANSESOPHAGEAL ECHOCARDIOGRAM (TEE);  Surgeon: Rexene Alberts, MD;  Location: [facility];  Service: Open Heart Surgery;  Laterality: N/A;  . TUBAL LIGATION    . VARICOSE VEIN SURGERY Right    Family History  Problem Relation Age of Onset  . Hypertension Mother   . Arrhythmia Mother   . Heart failure Mother   . Arrhythmia Brother   . Stroke Brother 51       cerebral hemorrhage, nonsmoker, no HTN  . Prostate cancer Brother   . Stroke Father        from an aneurysm  . Stroke Maternal Aunt 83       cerebral hemorrhage  . Liver cancer Maternal Grandmother   . Atrial fibrillation Son   . Celiac disease Son   . Heart attack Neg Hx   . Breast cancer Neg Hx    Social History   Socioeconomic History  . Marital status: Widowed    Spouse name: Deceased  . Number of children: 2  . Years of education: 45  . Highest education level: Not on file  Occupational History  . Occupation: Retired  Scientific laboratory technician  . Financial resource strain: Not hard at all  . Food insecurity:    Worry: Never true    Inability: Never true  . Transportation needs:    Medical: No    Non-medical: No  Tobacco Use  . Smoking status: Never Smoker  . Smokeless tobacco: Never Used  Substance and Sexual Activity  . Alcohol use: No    Alcohol/week: 3.0 standard drinks    Types: 3 Standard drinks or equivalent per week  . Drug use: No  . Sexual activity: Never  Lifestyle  . Physical activity:    Days per week: Not on file    Minutes per session: Not on file  . Stress: Not on file  Relationships  . Social connections:    Talks on phone: Not on file    Gets together: Not on file    Attends religious service: Not on file    Active member of club or organization: Not on file    Attends meetings of clubs or organizations: Not on file    Relationship status: Not on file  . Intimate partner violence:    Fear of  current or ex partner: Not on file    Emotionally abused: Not on file    Physically abused: Not on file    Forced sexual activity: Not on file  Other Topics Concern  . Not on file  Social History Narrative   She is a widow.  Husband died from metastatic renal cell cancer. Originally from [place]. Previously lived in [place] from 1975-[place]. Moved to [place] in 2007. No mold exposure recently but did have it through a prior  work exposure in 1993. Has a masters in public health. No bird exposure.      [facility] (09/29/17):   She has moved into a retirement community since last appointment. She reports she had testing at her new residence that was positive for mold. It has since been treated.    Current Meds  Medication Sig  . b complex vitamins tablet Take 1 tablet by mouth daily.  . butalbital-acetaminophen-caffeine (FIORICET, ESGIC) 50-325-40 MG tablet TAKE 1 TABLET BY MOUTH AS NEEDED FOR BACK PAIN OR MIGRAINE HEADACHE  . Cholecalciferol (VITAMIN D) 2000 units CAPS Take 2,000 Units by mouth daily.  Marland Kitchen conjugated estrogens (PREMARIN) vaginal cream Place 1 Applicatorful vaginally daily. For 2 weeks,  Then two nights per week thereafter  . ELIQUIS 5 MG TABS tablet TAKE ONE TABLET BY MOUTH TWICE DAILY  . Ferrous Sulfate (IRON) 325 (65 Fe) MG TABS   . folic acid (FOLVITE) 419 MCG tablet   . furosemide (LASIX) 40 MG tablet Take 40 mg by mouth as directed.  . lamoTRIgine (LAMICTAL) 100 MG tablet   . metoprolol tartrate (LOPRESSOR) 25 MG tablet TAKE ONE-HALF TABLET BY MOUTH TWICE DAILY  . montelukast (SINGULAIR) 10 MG tablet Take 10 mg by mouth at bedtime.  . rosuvastatin (CRESTOR) 40 MG tablet TAKE ONE TABLET BY MOUTH EVERY DAY   Allergies  Allergen Reactions  . Epinephrine Palpitations  . Meloxicam Other (See Comments)    Severe reflux  . Codeine Nausea And Vomiting    migraine  . Dextromethorphan     seizures  . Diphenhydramine Other (See Comments)    seizure  . Doxycycline Itching  and Swelling    Facial  . Hydrocodone Nausea And Vomiting    MIGRAINE  . Hydromorphone Nausea And Vomiting    migraine  . Oxycodone Nausea And Vomiting    Severe migraine  . Propofol Other (See Comments)   Recent Results (from the past 2160 hour(s))  Lipid panel     Status: None   Collection Time: 11/13/18  9:19 AM  Result Value Ref Range   Cholesterol 149 0 - 200 mg/dL    Comment: ATP III Classification       Desirable:  < 200 mg/dL               Borderline High:  200 - 239 mg/dL          High:  > = 240 mg/dL   Triglycerides 71.0 0.0 - 149.0 mg/dL    Comment: Normal:  <150 mg/dLBorderline High:  150 - 199 mg/dL   HDL 55.90 >39.00 mg/dL   VLDL 14.2 0.0 - 40.0 mg/dL   LDL Cholesterol 79 0 - 99 mg/dL   Total CHOL/HDL Ratio 3     Comment:                Men          Women1/2 Average Risk     3.4          3.3Average Risk          5.0          4.42X Average Risk          9.6          7.13X Average Risk          15.0          11.0  NonHDL 93.48     Comment: NOTE:  Non-HDL goal should be 30 mg/dL higher than patient's LDL goal (i.e. LDL goal of < 70 mg/dL, would have non-HDL goal of < 100 mg/dL)  Comprehensive metabolic panel     Status: None   Collection Time: 11/13/18  9:19 AM  Result Value Ref Range   Sodium 138 135 - 145 mEq/L   Potassium 3.8 3.5 - 5.1 mEq/L   Chloride 103 96 - 112 mEq/L   CO2 26 19 - 32 mEq/L   Glucose, Bld 95 70 - 99 mg/dL   BUN 20 6 - 23 mg/dL   Creatinine, Ser 0.81 0.40 - 1.20 mg/dL   Total Bilirubin 0.4 0.2 - 1.2 mg/dL   Alkaline Phosphatase 65 39 - 117 U/L   AST 19 0 - 37 U/L   ALT 15 0 - 35 U/L   Total Protein 6.5 6.0 - 8.3 g/dL   Albumin 3.9 3.5 - 5.2 g/dL   Calcium 9.0 8.4 - 10.5 mg/dL   GFR 72.67 >60.00 mL/min  CBC with Differential/Platelet     Status: Abnormal   Collection Time: 11/13/18  9:19 AM  Result Value Ref Range   WBC 5.9 4.0 - 10.5 K/uL   RBC 3.56 (L) 3.87 - 5.11 Mil/uL   Hemoglobin 10.9 (L) 12.0 - 15.0 g/dL    HCT 32.6 (L) 36.0 - 46.0 %   MCV 91.5 78.0 - 100.0 fl   MCHC 33.4 30.0 - 36.0 g/dL   RDW 14.9 11.5 - 15.5 %   Platelets 212.0 150.0 - 400.0 K/uL   Neutrophils Relative % 58.9 43.0 - 77.0 %   Lymphocytes Relative 30.3 12.0 - 46.0 %   Monocytes Relative 6.7 3.0 - 12.0 %   Eosinophils Relative 3.0 0.0 - 5.0 %   Basophils Relative 1.1 0.0 - 3.0 %   Neutro Abs 3.5 1.4 - 7.7 K/uL   Lymphs Abs 1.8 0.7 - 4.0 K/uL   Monocytes Absolute 0.4 0.1 - 1.0 K/uL   Eosinophils Absolute 0.2 0.0 - 0.7 K/uL   Basophils Absolute 0.1 0.0 - 0.1 K/uL  CUP PACEART REMOTE DEVICE CHECK     Status: None   Collection Time: 12/01/18  8:14 PM  Result Value Ref Range   Date Time Interrogation Session [id number]    Pulse Generator Manufacturer MERM    Pulse Gen Model ADDRL1 Adapta    Pulse Gen Serial Number [id number]    Clinic Name [facility]    Implantable Pulse Generator Type Implantable Pulse Generator    Implantable Pulse Generator Implant Date 01093235    Implantable Lead Manufacturer [facility]    Implantable Lead Model 5076 CapSureFix Novus    Implantable Lead Serial Number [id number]    Implantable Lead Implant Date 70623762    Implantable Lead Location Detail 1 UNKNOWN    Implantable Lead Location [id number]    Implantable Lead Manufacturer MERM    Implantable Lead Model 5076 CapSureFix Novus    Implantable Lead Serial Number [id number]    Implantable Lead Implant Date 73710626    Implantable Lead Location Detail 1 APEX    Implantable Lead Location [id number]    Lead Channel Setting Sensing Sensitivity 4.00 mV   Lead Channel Setting Pacing Amplitude 2.000 V   Lead Channel Setting Pacing Pulse Width 0.40 ms   Lead Channel Setting Pacing Amplitude 2.500 V   Lead Channel Impedance Value 486 ohm   Lead Channel Pacing Threshold Amplitude 0.375 V   Lead Channel Pacing  Threshold Pulse Width 0.40 ms   Lead Channel Impedance Value 803 ohm   Lead Channel Pacing Threshold Amplitude 0.625 V   Lead Channel  Pacing Threshold Pulse Width 0.40 ms   Battery Status OK    Battery Remaining Longevity 112 mo   Battery Voltage 2.80 V   Battery Impedance 208 ohm   Brady Statistic AP VP Percent 85 %   Brady Statistic AS VP Percent 15 %   Brady Statistic AP VS Percent 0 %   Brady Statistic AS VS Percent 0 %   Objective  Body mass index is 23 kg/m. Wt Readings from Last 3 Encounters:  01/20/19 138 lb 3.2 oz (62.7 kg)  12/09/18 140 lb 12.8 oz (63.9 kg)  11/13/18 139 lb (63 kg)   Temp Readings from Last 3 Encounters:  01/20/19 97.8 F (36.6 C) (Oral)  11/13/18 97.7 F (36.5 C) (Oral)  10/26/18 98.6 F (37 C) (Oral)   BP Readings from Last 3 Encounters:  01/20/19 96/62  12/09/18 118/66  11/13/18 100/70   Pulse Readings from Last 3 Encounters:  01/20/19 81  12/09/18 78  11/13/18 80    Physical Exam Vitals signs and nursing note reviewed.  Constitutional:      Appearance: Normal appearance. She is well-developed and well-groomed.  HENT:     Head: Normocephalic and atraumatic.     Nose: Nose normal.     Mouth/Throat:     Mouth: Mucous membranes are moist.     Pharynx: Oropharynx is clear.  Eyes:     Conjunctiva/sclera: Conjunctivae normal.     Pupils: Pupils are equal, round, and reactive to light.  Cardiovascular:     Rate and Rhythm: Normal rate and regular rhythm.     Heart sounds: Normal heart sounds. No murmur.     Comments: Pacemaker  Pulmonary:     Effort: Pulmonary effort is normal.     Breath sounds: Normal breath sounds.  Skin:    General: Skin is warm and dry.       Neurological:     General: No focal deficit present.     Mental Status: She is alert and oriented to person, place, and time. Mental status is at baseline.     Gait: Gait normal.  Psychiatric:        Attention and Perception: Attention and perception normal.        Mood and Affect: Mood and affect normal.        Speech: Speech normal.        Behavior: Behavior normal. Behavior is cooperative.          Thought Content: Thought content normal.        Cognition and Memory: Cognition and memory normal.        Judgment: Judgment normal.     Assessment   1. Tinea corporis  Plan   1. Miconazole  Diflucan x 2 doses 1 week apart  If not better will refer to dermatology has appt already 03/2019    Provider: Dr. Olivia Mackie McLean-Scocuz[facility]

## 2019-01-20 NOTE — Progress Notes (Signed)
Pre visit review using our clinic review tool, if applicable. No additional management support is needed unless otherwise documented below in the visit note. 

## 2019-02-17 ENCOUNTER — Other Ambulatory Visit: Payer: Self-pay | Admitting: Internal Medicine

## 2019-02-17 MED ORDER — FLUTICASONE PROPIONATE 50 MCG/ACT NA SUSP: 2.0000 | Freq: Every day | NASAL | 11 refills | Status: DC

## 2019-03-02 ENCOUNTER — Ambulatory Visit (INDEPENDENT_AMBULATORY_CARE_PROVIDER_SITE_OTHER): Payer: Medicare Other | Admitting: *Deleted

## 2019-03-02 ENCOUNTER — Other Ambulatory Visit: Payer: Self-pay

## 2019-03-02 DIAGNOSIS — I442 Atrioventricular block, complete: Secondary | ICD-10-CM

## 2019-03-02 LAB — CUP PACEART REMOTE DEVICE CHECK
Battery Impedance: 257 Ohm
Battery Remaining Longevity: 98 mo
Battery Voltage: 2.79 V
Brady Statistic AP VP Percent: 89 %
Brady Statistic AP VS Percent: 0 %
Brady Statistic AS VP Percent: 11 %
Brady Statistic AS VS Percent: 0 %
Date Time Interrogation Session: 20200331103729
Implantable Lead Implant Date: 20161205
Implantable Lead Implant Date: 20170321
Implantable Lead Location: 753859
Implantable Lead Location: 753860
Implantable Lead Model: 5076
Implantable Lead Model: 5076
Implantable Pulse Generator Implant Date: 20161205
Lead Channel Impedance Value: 485 Ohm
Lead Channel Impedance Value: 779 Ohm
Lead Channel Pacing Threshold Amplitude: 0.5 V
Lead Channel Pacing Threshold Amplitude: 0.625 V
Lead Channel Pacing Threshold Pulse Width: 0.4 ms
Lead Channel Pacing Threshold Pulse Width: 0.4 ms
Lead Channel Setting Pacing Amplitude: 2 V
Lead Channel Setting Pacing Amplitude: 2.5 V
Lead Channel Setting Pacing Pulse Width: 0.4 ms
Lead Channel Setting Sensing Sensitivity: 4 mV

## 2019-03-10 ENCOUNTER — Encounter: Payer: Self-pay | Admitting: Cardiology

## 2019-03-10 NOTE — Progress Notes (Signed)
Remote pacemaker transmission.   

## 2019-03-11 ENCOUNTER — Telehealth: Payer: Self-pay | Admitting: Internal Medicine

## 2019-03-11 NOTE — Telephone Encounter (Signed)
Insurance verification for Prolia filed on [employer] Portal. 

## 2019-03-15 ENCOUNTER — Ambulatory Visit: Payer: Medicare Other

## 2019-03-19 NOTE — Telephone Encounter (Signed)
Prolia injection scheduled. °

## 2019-03-24 ENCOUNTER — Other Ambulatory Visit: Payer: Self-pay

## 2019-03-24 ENCOUNTER — Ambulatory Visit (INDEPENDENT_AMBULATORY_CARE_PROVIDER_SITE_OTHER): Payer: Medicare Other

## 2019-03-24 DIAGNOSIS — M810 Age-related osteoporosis without current pathological fracture: Secondary | ICD-10-CM

## 2019-03-24 MED ORDER — DENOSUMAB 60 MG/ML ~~LOC~~ SOSY
60.0000 mg | PREFILLED_SYRINGE | Freq: Once | SUBCUTANEOUS | Status: AC
  Administered 2019-03-24: 60 mg via SUBCUTANEOUS

## 2019-03-24 NOTE — Progress Notes (Signed)
[name] [name] presents today for injection per MD orders. Prolia injection  administered SQ in left  Back of Upper Arm. Administration without incident. Patient tolerated well.  Johnedward Brodrick,cma

## 2019-04-05 ENCOUNTER — Other Ambulatory Visit: Payer: Self-pay | Admitting: Internal Medicine

## 2019-05-17 ENCOUNTER — Ambulatory Visit (INDEPENDENT_AMBULATORY_CARE_PROVIDER_SITE_OTHER): Payer: Medicare Other | Admitting: Internal Medicine

## 2019-05-17 ENCOUNTER — Other Ambulatory Visit: Payer: Self-pay

## 2019-05-17 ENCOUNTER — Encounter: Payer: Self-pay | Admitting: Internal Medicine

## 2019-05-17 DIAGNOSIS — Z9109 Other allergy status, other than to drugs and biological substances: Secondary | ICD-10-CM

## 2019-05-17 DIAGNOSIS — R7301 Impaired fasting glucose: Secondary | ICD-10-CM

## 2019-05-17 DIAGNOSIS — D509 Iron deficiency anemia, unspecified: Secondary | ICD-10-CM

## 2019-05-17 DIAGNOSIS — M810 Age-related osteoporosis without current pathological fracture: Secondary | ICD-10-CM | POA: Diagnosis not present

## 2019-05-17 DIAGNOSIS — Z79899 Other long term (current) drug therapy: Secondary | ICD-10-CM | POA: Diagnosis not present

## 2019-05-17 DIAGNOSIS — K5909 Other constipation: Secondary | ICD-10-CM

## 2019-05-17 DIAGNOSIS — E7800 Pure hypercholesterolemia, unspecified: Secondary | ICD-10-CM | POA: Diagnosis not present

## 2019-05-17 DIAGNOSIS — R569 Unspecified convulsions: Secondary | ICD-10-CM

## 2019-05-17 DIAGNOSIS — I4819 Other persistent atrial fibrillation: Secondary | ICD-10-CM

## 2019-05-17 MED ORDER — TRIAMCINOLONE ACETONIDE 0.1 % EX CREA: 1.0000 "application " | TOPICAL_CREAM | Freq: Two times a day (BID) | CUTANEOUS | 0 refills | Status: DC

## 2019-05-17 MED ORDER — BUTALBITAL-APAP-CAFFEINE 50-325-40 MG PO TABS: ORAL_TABLET | ORAL | 5 refills | Status: DC

## 2019-05-17 NOTE — Progress Notes (Signed)
Virtual Visit via [email or web address]  This visit type was conducted due to national recommendations for restrictions regarding the COVID-19 pandemic (e.g. social distancing).  This format is felt to be most appropriate for this patient at this time.  All issues noted in this document were discussed and addressed.  No physical exam was performed (except for noted visual exam findings with Video Visits).   I connected with@ on 05/17/19 at  8:30 AM EDT by a video enabled telemedicine application and verified that I am speaking with the correct person using two identifiers. Location patient: home Location provider: work or home office Persons participating in the virtual visit: patient, provider  I discussed the limitations, risks, security and privacy concerns of performing an evaluation and management service by telephone and the availability of in person appointments. I also discussed with the patient that there may be a patient responsible charge related to this service. The patient expressed understanding and agreed to proceed.   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  Less sleepy than Keppra , only 1 episode of numbness of foot/hand that resolved without LOC caused by decreased sleep  Once a week has an episode of hypersomnia that interrupts her sleep cycle for 48 hours with nocturnal insomnia due to daytime sleeping  No otc meds.. using fioricet prn . Sleeps the best when she takes a fioricet before bedtime   March 1 stopped taking iron due to persistent constipation .  Has been increasing her water intake , has a daily bm but still constipated  Recurrent hives right arm..  Taking singulair. Avoiding antihistamines  Due to seizure history.  Using HC cream   ROS: See pertinent positives and  negatives per HPI.  Past Medical History:  Diagnosis Date  . Acute on chronic diastolic (congestive) heart failure ([place])   . Allergy    See list  . Anemia 2018  . Anxiety    Occasionally take Xanax for sleep  . Anxiety associated with depression    Prn alprazolam   . Anxiety associated with depression   . Arthritis    Hands, Back  . Atrial fibrillation, persistent    DCCV 08/22/2015  . Bradycardia post-op bradycardia, pacer dependent   MDT PPM 11/06/15, Dr. Lovena Le  . Cataract    Left eye  . Diverticulosis   . Focal seizures ([place])   . Heart murmur   . Hemorrhoid   . Hepatic cyst    innumerable  . 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 Hyperlipidemia   . IBS (irritable bowel syndrome)   . 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 Migraine   . MVP (mitral valve prolapse)   . Nodule of right lung   . Osteoporosis of forearm   . RBBB   . Restrictive lung disease    Mild on PFT & likely cardiac in etiology   . S/P Minimally invasive maze operation for atrial fibrillation 10/31/2015   Complete bilateral atrial lesion set using cryothermy and bipolar radiofrequency ablation with clipping of LA appendage via right mini thoracotomy approach  . S/P minimally invasive mitral valve replacement with bioprosthetic valve 10/31/2015   33 mm [facility] Mitral bovine bioprosthetic tissue valve placed via right mini thoracotomy approach  . Severe mitral regurgitation   . 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 SVT (supraventricular tachycardia) ([place])   . Thoracic aorta atherosclerosis ([place])   . TIA (transient ischemic attack)     Past Surgical History:  Procedure Laterality Date  . BREAST BIOPSY Right Late 80s   Negative  . CARDIAC CATHETERIZATION N/A 10/18/2015   Procedure: Right/Left Heart Cath and Coronary Angiography;  Surgeon: Sherren Mocha, MD;  Location: [place] [facility];  Service: Cardiovascular;  Laterality: N/A;  . CARDIOVERSION N/A 08/22/2015    Procedure: CARDIOVERSION;  Surgeon: Thayer Headings, MD;  Location: [place];  Service: Cardiovascular;  Laterality: N/A;  . COLONOSCOPY  2003  . EP IMPLANTABLE DEVICE N/A 11/06/2015   Procedure: Pacemaker Implant;  Surgeon: Evans Lance, MD;  Location: [facility];  Service: Cardiovascular;  Laterality: N/A;  . EP IMPLANTABLE DEVICE N/A 02/20/2016   Procedure: Lead Extraction;  Surgeon: Evans Lance, MD;  Location: [place] [facility] [facility];  Service: Cardiovascular;  Laterality: N/A;  . EYE SURGERY Right 2013  . FOOT SURGERY    . MANDIBLE FRACTURE SURGERY  03-26-13  . MINIMALLY INVASIVE MAZE PROCEDURE N/A 10/31/2015   Procedure: MINIMALLY INVASIVE MAZE PROCEDURE;  Surgeon: Rexene Alberts, MD;  Location: [facility];  Service: Open Heart Surgery;  Laterality: N/A;  . MITRAL VALVE REPLACEMENT Right 10/31/2015   Procedure: MINIMALLY INVASIVE MITRAL VALVE (MV) REPLACEMENT;  Surgeon: Rexene Alberts, MD;  Location: [facility];  Service: Open Heart Surgery;  Laterality: Right;  . PACEMAKER LEAD REMOVAL  02/20/2016  . TEE WITH CARDIOVERSION    . TEE WITHOUT CARDIOVERSION N/A 08/22/2015   Procedure: TRANSESOPHAGEAL ECHOCARDIOGRAM (TEE);  Surgeon: Thayer Headings, MD;  Location: [facility];  Service: Cardiovascular;  Laterality: N/A;  . TEE WITHOUT CARDIOVERSION N/A 10/31/2015   Procedure: TRANSESOPHAGEAL ECHOCARDIOGRAM (TEE);  Surgeon: Rexene Alberts, MD;  Location: [facility];  Service: Open Heart Surgery;  Laterality: N/A;  . TUBAL LIGATION    . VARICOSE VEIN SURGERY Right     Family History  Problem Relation Age of Onset  . Hypertension Mother   . Arrhythmia Mother   . Heart failure Mother   . Arrhythmia Brother   . Stroke Brother 85       cerebral hemorrhage, nonsmoker, no HTN  . Prostate cancer Brother   . Stroke Father        from an aneurysm  . Stroke Maternal Aunt 83       cerebral hemorrhage  . Liver cancer Maternal Grandmother   . Atrial fibrillation Son   . Celiac disease Son   .  Heart attack Neg Hx   . Breast cancer Neg Hx     SOCIAL HX: Social History   Social History Narrative   She is a widow.  Husband died from metastatic renal cell cancer. Originally from [place]. Previously lived in [place] from 1975-[place]. Moved to [place] in 2007. No mold exposure recently but did have it through a prior work exposure in 1993. Has a masters in public health. No bird exposure.      [facility] (09/29/17):   She has moved into a retirement community since last appointment. She reports she had testing at her new residence that was positive for mold. It has since been treated.     Current Outpatient Medications:  .  b complex vitamins tablet, Take 1 tablet by mouth daily., Disp: , Rfl:  .  butalbital-acetaminophen-caffeine (FIORICET) 50-325-40 MG tablet, TAKE 1 TABLET BY  MOUTH AS NEEDED FOR BACK PAIN OR MIGRAINE HEADACHE, Disp: 90 tablet, Rfl: 5 .  Cholecalciferol (VITAMIN D) 2000 units CAPS, Take 2,000 Units by mouth daily., Disp: , Rfl:  .  conjugated estrogens (PREMARIN) vaginal cream, Place 1 Applicatorful vaginally daily. For 2 weeks,  Then two nights per week thereafter, Disp: 42.5 g, Rfl: 12 .  ELIQUIS 5 MG TABS tablet, TAKE ONE TABLET BY MOUTH TWICE DAILY, Disp: 180 tablet, Rfl: 3 .  fluticasone (FLONASE) 50 MCG/ACT nasal spray, Place 2 sprays into both nostrils daily., Disp: 16 g, Rfl: 11 .  folic acid (FOLVITE) 258 MCG tablet, , Disp: , Rfl:  .  furosemide (LASIX) 40 MG tablet, Take 40 mg by mouth as directed., Disp: , Rfl:  .  lamoTRIgine (LAMICTAL) 100 MG tablet, , Disp: , Rfl:  .  metoprolol tartrate (LOPRESSOR) 25 MG tablet, TAKE ONE-HALF TABLET BY MOUTH TWICE DAILY, Disp: 90 tablet, Rfl: 3 .  miconazole (MICATIN) 2 % cream, Apply 1 application topically 2 (two) times daily. X 2-3 weeks, Disp: 30 g, Rfl: 0 .  montelukast (SINGULAIR) 10 MG tablet, Take 10 mg by mouth at bedtime., Disp: , Rfl:  .  rosuvastatin (CRESTOR) 40 MG tablet, TAKE ONE TABLET BY MOUTH EVERY  DAY, Disp: 90 tablet, Rfl: 1 .  VENTOLIN HFA 108 (90 Base) MCG/ACT inhaler, , Disp: , Rfl:  .  triamcinolone cream (KENALOG) 0.1 %, Apply 1 application topically 2 (two) times daily., Disp: 30 g, Rfl: 0  EXAM:  VITALS per patient if applicable:  GENERAL: alert, oriented, appears well and in no acute distress  HEENT: atraumatic, conjunttiva clear, no obvious abnormalities on inspection of external nose and ears  NECK: normal movements of the head and neck  LUNGS: on inspection no signs of respiratory distress, breathing rate appears normal, no obvious gross SOB, gasping or wheezing  CV: no obvious cyanosis  MS: moves all visible extremities without noticeable abnormality  PSYCH/NEURO: pleasant and cooperative, no obvious depression or anxiety, speech and thought processing grossly intact  ASSESSMENT AND PLAN:  Discussed the following assessment and plan:   Osteoporosis of forearm Receiving prolia injections  Last one April 2020   Allergy to environmental factors With recurrent self limited episodes hives on right upper arm.  Continue singulair,  Has been advised to avoid antihistamines due to arrhythmia   Anemia Iron deficient,  With iron recently stopped due to constipation   Lab Results  Component Value Date   WBC 5.9 11/13/2018   HGB 10.9 (L) 11/13/2018   HCT 32.6 (L) 11/13/2018   MCV 91.5 11/13/2018   PLT 212.0 11/13/2018   Lab Results  Component Value Date   IRON 62 04/07/2018   TIBC 353 04/07/2018   FERRITIN 22 04/07/2018      Atrial fibrillation, persistent (HCC) currently rate controlled and has no signs of heart failure on exam.  Tolerating Eliquis for mitigation of embolic stroke risk   Focal seizures ([place]) Her seizures begin with numbness of hand and foot .  Tolerating lamictal without recurrence unless provoked by sleep deprivation.     I discussed the assessment and treatment plan with the patient. The patient was provided an opportunity to ask  questions and all were answered. The patient agreed with the plan and demonstrated an understanding of the instructions.   The patient was advised to call back or seek an in-person evaluation if the symptoms worsen or if the condition fails to improve as anticipated.  I provided 40  minutes of non-face-to-face time during this encounter.   [name], MD

## 2019-05-17 NOTE — Patient Instructions (Signed)
For your constipation:  The first course is to increase the fiber in your diet to 25 g daily . Fruit and vegetables are good sources,  The [employer] and Atkins protein bars , and the low carb breads   (see below)  are all heavy on  fiber,    You can also takea buld forming laxative: miralax, metamucil, fibercon, or citrucel daily to supplement your fiber. These are gentle and work in 1 to 2 days to relieve constipation, and  you can also combine them daily with colace,  A stool softener . The bulk forming laxatives should be taking 2 hours apart from your medications.  Also,  make 3 16 ounce servings of wtaer your minimum goal for water intake     HERE ARE THE LOW CARB  BREAD CHOICES  THAT ARE HIGHER IN FIBER THAN THE WELL KNOWN BREADS.  The [employer] MADE FROM WHOLE WHEAT HAS 26 G FIBER ! THESE CAN ALLALSO  BE BAKED TO CREATE PITA CHIPS

## 2019-05-17 NOTE — Assessment & Plan Note (Signed)
Receiving prolia injections  Last one April 2020

## 2019-05-18 NOTE — Assessment & Plan Note (Signed)
Iron deficient,  With iron recently stopped due to constipation   Lab Results  Component Value Date   WBC 5.9 11/13/2018   HGB 10.9 (L) 11/13/2018   HCT 32.6 (L) 11/13/2018   MCV 91.5 11/13/2018   PLT 212.0 11/13/2018   Lab Results  Component Value Date   IRON 62 04/07/2018   TIBC 353 04/07/2018   FERRITIN 22 04/07/2018

## 2019-05-18 NOTE — Assessment & Plan Note (Signed)
currently rate controlled and has no signs of heart failure on exam.  Tolerating Eliquis for mitigation of embolic stroke risk  °

## 2019-05-18 NOTE — Assessment & Plan Note (Signed)
With recurrent self limited episodes hives on right upper arm.  Continue singulair,  Has been advised to avoid antihistamines due to arrhythmia

## 2019-05-18 NOTE — Assessment & Plan Note (Signed)
Her seizures begin with numbness of hand and foot .  Tolerating lamictal without recurrence unless provoked by sleep deprivation.

## 2019-05-21 ENCOUNTER — Other Ambulatory Visit (INDEPENDENT_AMBULATORY_CARE_PROVIDER_SITE_OTHER): Payer: Medicare Other

## 2019-05-21 ENCOUNTER — Other Ambulatory Visit: Payer: Self-pay

## 2019-05-21 DIAGNOSIS — E7800 Pure hypercholesterolemia, unspecified: Secondary | ICD-10-CM

## 2019-05-21 DIAGNOSIS — Z79899 Other long term (current) drug therapy: Secondary | ICD-10-CM | POA: Diagnosis not present

## 2019-05-21 DIAGNOSIS — D509 Iron deficiency anemia, unspecified: Secondary | ICD-10-CM

## 2019-05-21 DIAGNOSIS — K5909 Other constipation: Secondary | ICD-10-CM | POA: Diagnosis not present

## 2019-05-21 DIAGNOSIS — R7301 Impaired fasting glucose: Secondary | ICD-10-CM

## 2019-05-21 LAB — CBC WITH DIFFERENTIAL/PLATELET
Basophils Absolute: 0 10*3/uL (ref 0.0–0.1)
Basophils Relative: 0.8 % (ref 0.0–3.0)
Eosinophils Absolute: 0.1 10*3/uL (ref 0.0–0.7)
Eosinophils Relative: 3 % (ref 0.0–5.0)
HCT: 33.8 % — ABNORMAL LOW (ref 36.0–46.0)
Hemoglobin: 11.5 g/dL — ABNORMAL LOW (ref 12.0–15.0)
Lymphocytes Relative: 34.8 % (ref 12.0–46.0)
Lymphs Abs: 1.6 10*3/uL (ref 0.7–4.0)
MCHC: 34 g/dL (ref 30.0–36.0)
MCV: 92.6 fl (ref 78.0–100.0)
Monocytes Absolute: 0.3 10*3/uL (ref 0.1–1.0)
Monocytes Relative: 7 % (ref 3.0–12.0)
Neutro Abs: 2.5 10*3/uL (ref 1.4–7.7)
Neutrophils Relative %: 54.4 % (ref 43.0–77.0)
Platelets: 99 10*3/uL — ABNORMAL LOW (ref 150.0–400.0)
RBC: 3.65 Mil/uL — ABNORMAL LOW (ref 3.87–5.11)
RDW: 13.4 % (ref 11.5–15.5)
WBC: 4.6 10*3/uL (ref 4.0–10.5)

## 2019-05-21 LAB — COMPREHENSIVE METABOLIC PANEL
ALT: 15 U/L (ref 0–35)
AST: 21 U/L (ref 0–37)
Albumin: 4.2 g/dL (ref 3.5–5.2)
Alkaline Phosphatase: 63 U/L (ref 39–117)
BUN: 24 mg/dL — ABNORMAL HIGH (ref 6–23)
CO2: 27 mEq/L (ref 19–32)
Calcium: 9.1 mg/dL (ref 8.4–10.5)
Chloride: 103 mEq/L (ref 96–112)
Creatinine, Ser: 0.92 mg/dL (ref 0.40–1.20)
GFR: 58.95 mL/min — ABNORMAL LOW (ref >60.00)
Glucose, Bld: 80 mg/dL (ref 70–99)
Potassium: 4.3 mEq/L (ref 3.5–5.1)
Sodium: 138 mEq/L (ref 135–145)
Total Bilirubin: 0.4 mg/dL (ref 0.2–1.2)
Total Protein: 5.9 g/dL — ABNORMAL LOW (ref 6.0–8.3)

## 2019-05-21 LAB — LIPID PANEL
Cholesterol: 143 mg/dL (ref 0–200)
HDL: 59.5 mg/dL (ref >39.00)
LDL Cholesterol: 71 mg/dL (ref 0–99)
NonHDL: 83.43
Total CHOL/HDL Ratio: 2
Triglycerides: 60 mg/dL (ref 0.0–149.0)
VLDL: 12 mg/dL (ref 0.0–40.0)

## 2019-05-21 LAB — TSH: TSH: 4.24 u[IU]/mL (ref 0.35–4.50)

## 2019-05-21 LAB — HEMOGLOBIN A1C: Hgb A1c MFr Bld: 5.7 % (ref 4.6–6.5)

## 2019-05-21 LAB — MAGNESIUM: Magnesium: 2.2 mg/dL (ref 1.5–2.5)

## 2019-05-22 LAB — IRON,TIBC AND FERRITIN PANEL
%SAT: 32 % (calc) (ref 16–45)
Ferritin: 55 ng/mL (ref 16–288)
Iron: 86 ug/dL (ref 45–160)
TIBC: 273 mcg/dL (calc) (ref 250–450)

## 2019-05-28 ENCOUNTER — Telehealth: Payer: Self-pay | Admitting: Internal Medicine

## 2019-05-28 DIAGNOSIS — D509 Iron deficiency anemia, unspecified: Secondary | ICD-10-CM

## 2019-05-28 NOTE — Telephone Encounter (Signed)
Pt called wanting to set up her next lab appt in 18m but there was no orders placed  Please advise pt when placed so she can schedule

## 2019-05-29 NOTE — Telephone Encounter (Signed)
Cbc and iron ordered

## 2019-05-31 NOTE — Telephone Encounter (Signed)
Labs scheduled on 08/23/19 @ 8:45 am.

## 2019-06-01 ENCOUNTER — Ambulatory Visit (INDEPENDENT_AMBULATORY_CARE_PROVIDER_SITE_OTHER): Payer: Medicare Other | Admitting: *Deleted

## 2019-06-01 DIAGNOSIS — I442 Atrioventricular block, complete: Secondary | ICD-10-CM

## 2019-06-01 LAB — CUP PACEART REMOTE DEVICE CHECK
Battery Impedance: 257 Ohm
Battery Remaining Longevity: 98 mo
Battery Voltage: 2.79 V
Brady Statistic AP VP Percent: 90 %
Brady Statistic AP VS Percent: 0 %
Brady Statistic AS VP Percent: 10 %
Brady Statistic AS VS Percent: 0 %
Date Time Interrogation Session: 20200630155519
Implantable Lead Implant Date: 20161205
Implantable Lead Implant Date: 20170321
Implantable Lead Location: 753859
Implantable Lead Location: 753860
Implantable Lead Model: 5076
Implantable Lead Model: 5076
Implantable Pulse Generator Implant Date: 20161205
Lead Channel Impedance Value: 500 Ohm
Lead Channel Impedance Value: 756 Ohm
Lead Channel Pacing Threshold Amplitude: 0.5 V
Lead Channel Pacing Threshold Amplitude: 0.625 V
Lead Channel Pacing Threshold Pulse Width: 0.4 ms
Lead Channel Pacing Threshold Pulse Width: 0.4 ms
Lead Channel Setting Pacing Amplitude: 2 V
Lead Channel Setting Pacing Amplitude: 2.5 V
Lead Channel Setting Pacing Pulse Width: 0.4 ms
Lead Channel Setting Sensing Sensitivity: 4 mV

## 2019-06-11 ENCOUNTER — Encounter: Payer: Self-pay | Admitting: Cardiology

## 2019-06-11 ENCOUNTER — Other Ambulatory Visit: Payer: Self-pay

## 2019-06-11 ENCOUNTER — Telehealth: Payer: Self-pay | Admitting: Cardiology

## 2019-06-11 ENCOUNTER — Ambulatory Visit (INDEPENDENT_AMBULATORY_CARE_PROVIDER_SITE_OTHER): Payer: Medicare Other | Admitting: Cardiology

## 2019-06-11 VITALS — BP 110/74 | HR 82 | Ht 65.0 in | Wt 140.0 lb

## 2019-06-11 DIAGNOSIS — I4819 Other persistent atrial fibrillation: Secondary | ICD-10-CM | POA: Diagnosis not present

## 2019-06-11 DIAGNOSIS — Z950 Presence of cardiac pacemaker: Secondary | ICD-10-CM | POA: Diagnosis not present

## 2019-06-11 DIAGNOSIS — Z952 Presence of prosthetic heart valve: Secondary | ICD-10-CM

## 2019-06-11 DIAGNOSIS — I5022 Chronic systolic (congestive) heart failure: Secondary | ICD-10-CM | POA: Diagnosis not present

## 2019-06-11 MED ORDER — FUROSEMIDE 40 MG PO TABS: 40.0000 mg | ORAL_TABLET | ORAL | 11 refills | Status: DC

## 2019-06-11 MED ORDER — AMOXICILLIN 500 MG PO TABS: ORAL_TABLET | ORAL | 6 refills | Status: DC

## 2019-06-11 NOTE — Telephone Encounter (Signed)

## 2019-06-11 NOTE — Progress Notes (Signed)
Cardiology Office Note:    Date:  06/11/2019   ID:  [name] [name], DOB [birth year], MRN [id number]  PCP:  [name], MD  Cardiologist:  Mertie Moores, MD  Referring MD: [name], MD   Chief Complaint  Patient presents with  . Follow-up    Afib, s/p MVR    History of Present Illness:    [name] [name] is a 78 y.o. 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    She also has a seizure disorder controlled on Lamictal.  Last seen 12/2018.  [name] [name] seen today for 48-month follow-up alone. Lives at [facility] center, independent home.  She has been doing well with her seizure treatment. No recent seizures, last was 4/27 when overly tired. She was active prior to the pandemic, but not so much now. They have strict restrictions at her living community. She just got a yoga DVD and plans to start doing yoga in her home. No chest pain/pressure, Shortness of breath, orthopnea, PND, edema, lightheadedness, dizziness, syncope.   Past Medical History:  Diagnosis Date  . Acute on chronic diastolic (congestive) heart failure ([place])   . Allergy    See list  . Anemia 2018  . Anxiety    Occasionally take Xanax for sleep  . Anxiety associated with depression    Prn alprazolam   . Anxiety associated with depression   . Arthritis    Hands, Back  . Atrial fibrillation, persistent    DCCV 08/22/2015  . Bradycardia post-op bradycardia, pacer dependent   MDT PPM 11/06/15, Dr. Lovena Le  . Cataract    Left eye  . Diverticulosis   . Focal seizures ([place])   . Heart murmur   . Hemorrhoid   . Hepatic cyst    innumerable  . 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 Hyperlipidemia   . IBS (irritable bowel syndrome)   . 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 Migraine   . MVP (mitral valve prolapse)   . Nodule of right lung   . Osteoporosis of forearm   . RBBB   . Restrictive lung disease    Mild on PFT & likely cardiac in etiology   . S/P Minimally invasive maze operation for atrial fibrillation 10/31/2015   Complete bilateral atrial lesion set using cryothermy and bipolar radiofrequency ablation with clipping of LA appendage via right mini thoracotomy approach  . S/P minimally invasive mitral valve replacement with bioprosthetic valve 10/31/2015   33 mm [facility] Mitral bovine bioprosthetic tissue valve placed via right mini thoracotomy approach  . Severe mitral regurgitation   . 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 SVT (supraventricular tachycardia) ([place])   . Thoracic aorta atherosclerosis ([place])   . TIA (transient ischemic attack)     Past Surgical History:  Procedure Laterality Date  . BREAST BIOPSY Right Late 80s   Negative  . CARDIAC CATHETERIZATION N/A 10/18/2015   Procedure: Right/Left Heart Cath and Coronary Angiography;  Surgeon: Sherren Mocha, MD;  Location: [place] [facility];  Service: Cardiovascular;  Laterality: N/A;  . CARDIOVERSION N/A 08/22/2015   Procedure: CARDIOVERSION;  Surgeon: Thayer Headings, MD;  Location: [place];  Service: Cardiovascular;  Laterality: N/A;  . COLONOSCOPY  2003  . EP IMPLANTABLE DEVICE N/A 11/06/2015   Procedure: Pacemaker Implant;  Surgeon: Evans Lance, MD;  Location: [place] [facility];  Service: Cardiovascular;  Laterality: N/A;  . EP IMPLANTABLE DEVICE N/A 02/20/2016   Procedure: Lead Extraction;  Surgeon: Evans Lance, MD;  Location: [place] [facility];  Service: Cardiovascular;  Laterality: N/A;  . EYE SURGERY Right 2013  . FOOT SURGERY    . MANDIBLE FRACTURE SURGERY  03-26-13  . MINIMALLY INVASIVE MAZE  PROCEDURE N/A 10/31/2015   Procedure: MINIMALLY INVASIVE MAZE PROCEDURE;  Surgeon: Rexene Alberts, MD;  Location: [place];  Service: Open Heart Surgery;  Laterality: N/A;  . MITRAL VALVE REPLACEMENT Right 10/31/2015   Procedure: MINIMALLY INVASIVE MITRAL VALVE (MV) REPLACEMENT;  Surgeon: Rexene Alberts, MD;  Location: [place];  Service: Open Heart Surgery;  Laterality: Right;  . PACEMAKER LEAD REMOVAL  02/20/2016  . TEE WITH CARDIOVERSION    . TEE WITHOUT CARDIOVERSION N/A 08/22/2015   Procedure: TRANSESOPHAGEAL ECHOCARDIOGRAM (TEE);  Surgeon: Thayer Headings, MD;  Location: [place];  Service: Cardiovascular;  Laterality: N/A;  . TEE WITHOUT CARDIOVERSION N/A 10/31/2015   Procedure: TRANSESOPHAGEAL ECHOCARDIOGRAM (TEE);  Surgeon: Rexene Alberts, MD;  Location: [place];  Service: Open Heart Surgery;  Laterality: N/A;  . TUBAL LIGATION    . VARICOSE VEIN SURGERY Right     Current Medications: Current Meds  Medication Sig  . b complex vitamins tablet Take 1 tablet by mouth daily.  . butalbital-acetaminophen-caffeine (FIORICET) 50-325-40 MG tablet TAKE 1 TABLET BY MOUTH AS NEEDED FOR BACK PAIN OR MIGRAINE HEADACHE  . Cholecalciferol (VITAMIN D) 2000 units CAPS Take 2,000 Units by mouth daily.  Marland Kitchen conjugated estrogens (PREMARIN) vaginal cream Place 1 Applicatorful vaginally daily. For 2 weeks,  Then two nights per week thereafter  . ELIQUIS 5 MG TABS tablet TAKE ONE TABLET BY MOUTH TWICE DAILY  . fluticasone (FLONASE) 50 MCG/ACT nasal spray Place 2 sprays into both nostrils daily.  . folic acid (FOLVITE) 211 MCG tablet   . furosemide (LASIX) 40 MG tablet Take 40 mg by mouth as directed.  . lamoTRIgine (LAMICTAL) 100 MG tablet   . metoprolol tartrate (LOPRESSOR) 25 MG tablet TAKE ONE-HALF TABLET BY MOUTH TWICE DAILY  . miconazole (MICATIN) 2 % cream Apply 1 application topically 2 (two) times daily. X 2-3 weeks  . montelukast (SINGULAIR) 10 MG tablet Take 10 mg by mouth at bedtime.  .  rosuvastatin (CRESTOR) 40 MG tablet TAKE ONE TABLET BY MOUTH EVERY DAY  . triamcinolone cream (KENALOG) 0.1 % Apply 1 application topically 2 (two) times daily.  . VENTOLIN HFA 108 (90 Base) MCG/ACT inhaler      Allergies:   Epinephrine, Meloxicam, Codeine, Dextromethorphan, Diphenhydramine, Doxycycline, Hydrocodone, Hydromorphone, Oxycodone, and Propofol   Social History   Socioeconomic History  . Marital status: Widowed    Spouse name: Deceased  . Number of children: 2  . Years of education: 27  . Highest education level: Not on file  Occupational History  . Occupation: Retired  Scientific laboratory technician  . Financial resource strain: Not hard at all  . Food insecurity    Worry: Never true    Inability: Never true  . Transportation needs    Medical: No    Non-medical: No  Tobacco Use  . Smoking status: Never Smoker  . Smokeless tobacco: Never Used  Substance and Sexual Activity  .  Alcohol use: No    Alcohol/week: 3.0 standard drinks    Types: 3 Standard drinks or equivalent per week  . Drug use: No  . Sexual activity: Never  Lifestyle  . Physical activity    Days per week: Not on file    Minutes per session: Not on file  . Stress: Not on file  Relationships  . Social Herbalist on phone: Not on file    Gets together: Not on file    Attends religious service: Not on file    Active member of club or organization: Not on file    Attends meetings of clubs or organizations: Not on file    Relationship status: Not on file  Other Topics Concern  . Not on file  Social History Narrative   She is a widow.  Husband died from metastatic renal cell cancer. Originally from [place]. Previously lived in [place] from 1975-[place]. Moved to [place] in 2007. No mold exposure recently but did have it through a prior work exposure in 1993. Has a masters in public health. No bird exposure.      [facility] (09/29/17):   She has moved into a retirement community since last appointment. She  reports she had testing at her new residence that was positive for mold. It has since been treated.      Family History: The patient's family history includes Arrhythmia in her brother and mother; Atrial fibrillation in her son; Celiac disease in her son; Heart failure in her mother; Hypertension in her mother; Liver cancer in her maternal grandmother; Prostate cancer in her brother; Stroke in her father; Stroke (age of onset: 5) in her brother; Stroke (age of onset: 59) in her maternal aunt. There is no history of Heart attack or Breast cancer. ROS:   Please see the history of present illness.     All other systems reviewed and are negative.  EKGs/Labs/Other Studies Reviewed:    The following studies were reviewed today:  Echocardiogram 02/12/2017 Study Conclusions  - Left ventricle: The cavity size was normal. Systolic function was   mildly to moderately reduced. The estimated ejection fraction was   in the range of 40% to 45%. Hypokinesis of the anteroseptal,   inferior, and inferoseptal myocardium. The study is not   technically sufficient to allow evaluation of LV diastolic   function. - Aortic valve: Transvalvular velocity was within the normal range.   There was no stenosis. There was no regurgitation. - Mitral valve: A bioprosthesis was present and functioning   normally. Transvalvular velocity was within the normal range.   There was no evidence for stenosis. There was no regurgitation.   Mean gradient (D): 5 mm Hg. Peak gradient (D): 10 mm Hg. - Left atrium: The atrium was severely dilated. - Right ventricle: The cavity size was normal. Wall thickness was   normal. Systolic function was normal. - Atrial septum: No defect or patent foramen ovale was identified. - Tricuspid valve: There was mild regurgitation. - Pulmonary arteries: Systolic pressure was mildly increased. PA   peak pressure: 40 mm Hg (S).   EKG:  EKG is not ordered today.   Recent Labs: 05/21/2019: ALT  15; BUN 24; Creatinine, Ser 0.92; Hemoglobin 11.5; Magnesium 2.2; Platelets 99.0; Potassium 4.3; Sodium 138; TSH 4.24   Recent Lipid Panel    Component Value Date/Time   CHOL 143 05/21/2019 0816   CHOL 162 12/05/2014 0848   TRIG 60.0 05/21/2019 0816   HDL 59.50  05/21/2019 0816   HDL 61 12/05/2014 0848   CHOLHDL 2 05/21/2019 0816   VLDL 12.0 05/21/2019 0816   LDLCALC 71 05/21/2019 0816   LDLCALC 85 12/05/2014 0848   LDLDIRECT 113.0 02/15/2016 0930    Physical Exam:    VS:  BP 110/74   Pulse 82   Ht 5\' 5"  (1.651 m)   Wt 140 lb (63.5 kg)   BMI 23.30 kg/m     Wt Readings from Last 3 Encounters:  06/11/19 140 lb (63.5 kg)  01/20/19 138 lb 3.2 oz (62.7 kg)  12/09/18 140 lb 12.8 oz (63.9 kg)     Physical Exam  Constitutional: She is oriented to person, place, and time. She appears well-developed and well-nourished. No distress.  HENT:  Head: Normocephalic and atraumatic.  Neck: Normal range of motion. Neck supple. No JVD present.  Cardiovascular: Normal rate, regular rhythm, normal heart sounds and intact distal pulses. Exam reveals no gallop and no friction rub.  No murmur heard. Pulmonary/Chest: Effort normal and breath sounds normal. No respiratory distress. She has no wheezes. She has no rales.  Abdominal: Soft. Bowel sounds are normal.  Musculoskeletal: Normal range of motion.        General: No edema.  Neurological: She is alert and oriented to person, place, and time.  Skin: Skin is warm and dry.  Psychiatric: She has a normal mood and affect. Her behavior is normal. Judgment and thought content normal.  Vitals reviewed.   ASSESSMENT:    1. Atrial fibrillation, persistent   2. Chronic systolic congestive heart failure ([place])   3. S/P MVR (mitral valve replacement)   4. Cardiac pacemaker in situ    PLAN:    In order of problems listed above:  Atrial fibrillation, persistent Status post Maze procedure in 2016.  Has been maintaining sinus rhythm since maze  procedure, less than 0.01% mode switched on PPM.  Continue on apixaban for stroke risk reduction.  Recent labs with normal renal function and hemoglobin stable.  Chronic systolic CHF -EF 40-35%.  Volume status stable.  Continue beta-blocker.  Not on ACE inhibitor/ARB due to low blood pressure.  She takes Lasix as needed, only takes rarely as much as once a month usually after eating too much salt.  Status post mitral valve replacement 2016 -Normal function by echo 2018.  Continue SBE prophylaxis.   Permanent pacemaker in situ -Followed by Dr. Lovena Le  Medication Adjustments/Labs and Tests Ordered: Current medicines are reviewed at length with the patient today.  Concerns regarding medicines are outlined above. Labs and tests ordered and medication changes are outlined in the patient instructions below:  There are no Patient Instructions on file for this visit.   Signed, Daune Perch, NP  06/11/2019 3:28 PM    [facility]

## 2019-06-11 NOTE — Addendum Note (Signed)
Addended by: Jacinta Shoe on: 06/11/2019 03:39 PM   Modules accepted: Orders

## 2019-06-11 NOTE — Patient Instructions (Addendum)
Medication Instructions:  Your physician has recommended you make the following change in your medication:   1. TAKE AMOXICILLIN 4 TABLETS 60 MINUTES PRIOR TO DENTAL PROCEDURES.  If you need a refill on your cardiac medications before your next appointment, please call your pharmacy.   Lab work: NONE If you have labs (blood work) drawn today and your tests are completely normal, you will receive your results only by: Marland Kitchen MyChart Message (if you have MyChart) OR . A paper copy in the mail If you have any lab test that is abnormal or we need to change your treatment, we will call you to review the results.  Testing/Procedures: NONE  Follow-Up: At [facility], you and your health needs are our priority.  As part of our continuing mission to provide you with exceptional heart care, we have created designated Provider Care Teams.  These Care Teams include your primary Cardiologist (physician) and Advanced Practice Providers (APPs -  Physician Assistants and Nurse Practitioners) who all work together to provide you with the care you need, when you need it. You will need a follow up appointment in:  6 months.  Please call our office 2 months in advance to schedule this appointment.  You may see Mertie Moores, MD or one of the following Advanced Practice Providers on your designated Care Team: Richardson Dopp, PA-C [place], [place] . Daune Perch, NP  Any Other Special Instructions Will Be Listed Below (If Applicable).  Lifestyle Modifications to Prevent and Treat Heart Disease -Recommend heart healthy/Mediterranean diet, with whole grains, fruits, vegetable, fish, lean meats, nuts, and olive oil.  -Limit salt. -Recommend moderate walking, 3-5 times/week for 30-50 minutes each session. Aim for at least 150 minutes.week. Goal should be pace of 3 miles/hours, or walking 1.5 miles in 30 minutes -Recommend avoidance of tobacco products. Avoid excess alcohol. -Keep blood pressure well controlled,  ideally less than 130/80.

## 2019-06-12 ENCOUNTER — Encounter: Payer: Self-pay | Admitting: Cardiology

## 2019-06-12 NOTE — Progress Notes (Signed)
Remote pacemaker transmission.   

## 2019-06-28 ENCOUNTER — Other Ambulatory Visit: Payer: Self-pay | Admitting: Cardiovascular Disease

## 2019-07-01 ENCOUNTER — Telehealth: Payer: Self-pay

## 2019-07-01 NOTE — Telephone Encounter (Signed)

## 2019-07-02 ENCOUNTER — Encounter: Payer: Self-pay | Admitting: Internal Medicine

## 2019-07-02 ENCOUNTER — Other Ambulatory Visit: Payer: Self-pay

## 2019-07-02 ENCOUNTER — Ambulatory Visit (INDEPENDENT_AMBULATORY_CARE_PROVIDER_SITE_OTHER): Payer: Medicare Other | Admitting: Internal Medicine

## 2019-07-02 VITALS — BP 110/68 | HR 90 | Ht 65.0 in | Wt 138.0 lb

## 2019-07-02 DIAGNOSIS — E7800 Pure hypercholesterolemia, unspecified: Secondary | ICD-10-CM | POA: Diagnosis not present

## 2019-07-02 DIAGNOSIS — Z950 Presence of cardiac pacemaker: Secondary | ICD-10-CM

## 2019-07-02 DIAGNOSIS — I495 Sick sinus syndrome: Secondary | ICD-10-CM | POA: Insufficient documentation

## 2019-07-02 DIAGNOSIS — I4819 Other persistent atrial fibrillation: Secondary | ICD-10-CM | POA: Diagnosis not present

## 2019-07-02 LAB — CUP PACEART INCLINIC DEVICE CHECK
Battery Impedance: 281 Ohm
Battery Remaining Longevity: 95 mo
Battery Voltage: 2.8 V
Brady Statistic AP VP Percent: 90 %
Brady Statistic AP VS Percent: 0 %
Brady Statistic AS VP Percent: 10 %
Brady Statistic AS VS Percent: 0 %
Date Time Interrogation Session: 20200731144545
Implantable Lead Implant Date: 20161205
Implantable Lead Implant Date: 20170321
Implantable Lead Location: 753859
Implantable Lead Location: 753860
Implantable Lead Model: 5076
Implantable Lead Model: 5076
Implantable Pulse Generator Implant Date: 20161205
Lead Channel Impedance Value: 500 Ohm
Lead Channel Impedance Value: 798 Ohm
Lead Channel Pacing Threshold Amplitude: 0.5 V
Lead Channel Pacing Threshold Amplitude: 0.75 V
Lead Channel Pacing Threshold Pulse Width: 0.4 ms
Lead Channel Pacing Threshold Pulse Width: 0.4 ms
Lead Channel Sensing Intrinsic Amplitude: 1 mV
Lead Channel Setting Pacing Amplitude: 2 V
Lead Channel Setting Pacing Amplitude: 2.5 V
Lead Channel Setting Pacing Pulse Width: 0.4 ms
Lead Channel Setting Sensing Sensitivity: 4 mV

## 2019-07-02 NOTE — Progress Notes (Signed)
HPI Ms. Ugarte returns today for PPM followup. She is a pleasant 78yo woman with symptomatic bradycardia due to both sinus node dysfunction and heart block after MAZE/MV repair. She underwent PPM insertion followed by revision over 3 years ago. She has known atrial fib and is on systemic anti-coagulation. She has had problems with what she describes as a type of seizure.  Allergies  Allergen Reactions  . Epinephrine Palpitations  . Meloxicam Other (See Comments)    Severe reflux  . Codeine Nausea And Vomiting    migraine  . Dextromethorphan     seizures  . Diphenhydramine Other (See Comments)    seizure  . Doxycycline Itching and Swelling    Facial  . Hydrocodone Nausea And Vomiting    MIGRAINE  . Hydromorphone Nausea And Vomiting    migraine  . Oxycodone Nausea And Vomiting    Severe migraine  . Propofol Other (See Comments)     Current Outpatient Medications  Medication Sig Dispense Refill  . amoxicillin (AMOXIL) 500 MG tablet Take 4 tablets 60 minutes prior to dental procedures 30 tablet 6  . Ascorbic Acid (VITAMIN C) 1000 MG tablet Take 11 tablets by mouth daily.    Marland Kitchen b complex vitamins tablet Take 1 tablet by mouth daily.    . butalbital-acetaminophen-caffeine (FIORICET) 50-325-40 MG tablet TAKE 1 TABLET BY MOUTH AS NEEDED FOR BACK PAIN OR MIGRAINE HEADACHE 90 tablet 5  . Cholecalciferol (VITAMIN D) 2000 units CAPS Take 2,000 Units by mouth daily.    Marland Kitchen conjugated estrogens (PREMARIN) vaginal cream Place 1 Applicatorful vaginally daily. For 2 weeks,  Then two nights per week thereafter 42.5 g 12  . ELIQUIS 5 MG TABS tablet TAKE ONE TABLET BY MOUTH TWICE DAILY 180 tablet 3  . fluticasone (FLONASE) 50 MCG/ACT nasal spray Place 2 sprays into both nostrils daily. 16 g 11  . folic acid (FOLVITE) 921 MCG tablet     . furosemide (LASIX) 40 MG tablet Take 1 tablet (40 mg total) by mouth as directed. 30 tablet 11  . lamoTRIgine (LAMICTAL) 100 MG tablet Take 100 mg by  mouth 2 (two) times daily.     . metoprolol tartrate (LOPRESSOR) 25 MG tablet TAKE 1/2 TABLET BY MOUTH TWICE DAILY 90 tablet 3  . miconazole (MICATIN) 2 % cream Apply 1 application topically 2 (two) times daily. X 2-3 weeks 30 g 0  . montelukast (SINGULAIR) 10 MG tablet Take 10 mg by mouth at bedtime.    . polyethylene glycol powder (MIRALAX) 17 GM/SCOOP powder Take 1 Dose by mouth daily.    . rosuvastatin (CRESTOR) 40 MG tablet TAKE ONE TABLET BY MOUTH EVERY DAY 90 tablet 1  . triamcinolone cream (KENALOG) 0.1 % Apply 1 application topically 2 (two) times daily. 30 g 0  . VENTOLIN HFA 108 (90 Base) MCG/ACT inhaler      No current facility-administered medications for this visit.      Past Medical History:  Diagnosis Date  . Acute on chronic diastolic (congestive) heart failure ([place])   . Allergy    See list  . Anemia 2018  . Anxiety    Occasionally take Xanax for sleep  . Anxiety associated with depression    Prn alprazolam   . Anxiety associated with depression   . Arthritis    Hands, Back  . Atrial fibrillation, persistent    DCCV 08/22/2015  . Bradycardia post-op bradycardia, pacer dependent   MDT PPM 11/06/15, Dr. Lovena Le  .  Cataract    Left eye  . Diverticulosis   . Focal seizures ([place])   . Heart murmur   . Hemorrhoid   . Hepatic cyst    innumerable  . 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 Hyperlipidemia   . IBS (irritable bowel syndrome)   . 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 Migraine   . MVP (mitral valve prolapse)   . Nodule of right lung   . Osteoporosis of forearm   . RBBB   . Restrictive lung disease    Mild on PFT & likely cardiac in etiology   . S/P Minimally invasive maze operation for atrial fibrillation 10/31/2015   Complete bilateral atrial lesion set using cryothermy and bipolar radiofrequency ablation with clipping of LA appendage via right mini thoracotomy approach  . S/P minimally invasive mitral valve replacement with bioprosthetic  valve 10/31/2015   33 mm [facility] Mitral bovine bioprosthetic tissue valve placed via right mini thoracotomy approach  . Severe mitral regurgitation   . 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 SVT (supraventricular tachycardia) ([place])   . Thoracic aorta atherosclerosis ([place])   . TIA (transient ischemic attack)     ROS:   All systems reviewed and negative except as noted in the HPI.   Past Surgical History:  Procedure Laterality Date  . BREAST BIOPSY Right Late 80s   Negative  . CARDIAC CATHETERIZATION N/A 10/18/2015   Procedure: Right/Left Heart Cath and Coronary Angiography;  Surgeon: Sherren Mocha, MD;  Location: [place] [facility];  Service: Cardiovascular;  Laterality: N/A;  . CARDIOVERSION N/A 08/22/2015   Procedure: CARDIOVERSION;  Surgeon: Thayer Headings, MD;  Location: [place];  Service: Cardiovascular;  Laterality: N/A;  . COLONOSCOPY  2003  . EP IMPLANTABLE DEVICE N/A 11/06/2015   Procedure: Pacemaker Implant;  Surgeon: Evans Lance, MD;  Location: [place] [facility];  Service: Cardiovascular;  Laterality: N/A;  . EP IMPLANTABLE DEVICE N/A 02/20/2016   Procedure: Lead Extraction;  Surgeon: Evans Lance, MD;  Location: [place] [facility];  Service: Cardiovascular;  Laterality: N/A;  . EYE SURGERY Right 2013  . FOOT SURGERY    . MANDIBLE FRACTURE SURGERY  03-26-13  . MINIMALLY INVASIVE MAZE PROCEDURE N/A 10/31/2015   Procedure: MINIMALLY INVASIVE MAZE PROCEDURE;  Surgeon: Rexene Alberts, MD;  Location: [place];  Service: Open Heart Surgery;  Laterality: N/A;  . MITRAL VALVE REPLACEMENT Right 10/31/2015   Procedure: MINIMALLY INVASIVE MITRAL VALVE (MV) REPLACEMENT;  Surgeon: Rexene Alberts, MD;  Location: [place];  Service: Open Heart Surgery;  Laterality: Right;  . PACEMAKER LEAD REMOVAL  02/20/2016  . TEE WITH CARDIOVERSION    . TEE WITHOUT CARDIOVERSION N/A 08/22/2015   Procedure: TRANSESOPHAGEAL ECHOCARDIOGRAM (TEE);  Surgeon: Thayer Headings, MD;   Location: [place];  Service: Cardiovascular;  Laterality: N/A;  . TEE WITHOUT CARDIOVERSION N/A 10/31/2015   Procedure: TRANSESOPHAGEAL ECHOCARDIOGRAM (TEE);  Surgeon: Rexene Alberts, MD;  Location: [place];  Service: Open Heart Surgery;  Laterality: N/A;  . TUBAL LIGATION    . VARICOSE VEIN SURGERY Right      Family History  Problem Relation Age of Onset  . Hypertension Mother   . Arrhythmia Mother   . Heart failure Mother   . Arrhythmia Brother   . Stroke Brother 79       cerebral hemorrhage, nonsmoker, no HTN  . Prostate cancer Brother   . Stroke Father  from an aneurysm  . Stroke Maternal Aunt 83       cerebral hemorrhage  . Liver cancer Maternal Grandmother   . Atrial fibrillation Son   . Celiac disease Son   . Heart attack Neg Hx   . Breast cancer Neg Hx      Social History   Socioeconomic History  . Marital status: Widowed    Spouse name: Deceased  . Number of children: 2  . Years of education: 84  . Highest education level: Not on file  Occupational History  . Occupation: Retired  Scientific laboratory technician  . Financial resource strain: Not hard at all  . Food insecurity    Worry: Never true    Inability: Never true  . Transportation needs    Medical: No    Non-medical: No  Tobacco Use  . Smoking status: Never Smoker  . Smokeless tobacco: Never Used  Substance and Sexual Activity  . Alcohol use: No    Alcohol/week: 3.0 standard drinks    Types: 3 Standard drinks or equivalent per week  . Drug use: No  . Sexual activity: Never  Lifestyle  . Physical activity    Days per week: Not on file    Minutes per session: Not on file  . Stress: Not on file  Relationships  . Social Herbalist on phone: Not on file    Gets together: Not on file    Attends religious service: Not on file    Active member of club or organization: Not on file    Attends meetings of clubs or organizations: Not on file    Relationship status: Not on file  . Intimate  partner violence    Fear of current or ex partner: Not on file    Emotionally abused: Not on file    Physically abused: Not on file    Forced sexual activity: Not on file  Other Topics Concern  . Not on file  Social History Narrative   She is a widow.  Husband died from metastatic renal cell cancer. Originally from [place]. Previously lived in [place] from 1975-2007. Moved to [place] in 2007. No mold exposure recently but did have it through a prior work exposure in 1993. Has a masters in public health. No bird exposure.      [facility] (09/29/17):   She has moved into a retirement community since last appointment. She reports she had testing at her new residence that was positive for mold. It has since been treated.      BP 110/68   Pulse 90   Ht 5\' 5"  (1.651 m)   Wt 138 lb (62.6 kg)   SpO2 97%   BMI 22.96 kg/m   Physical Exam:  Well appearing NAD HEENT: Unremarkable Neck:  No JVD, no thyromegally Lymphatics:  No adenopathy Back:  No CVA tenderness Lungs:  Clear with no wheezes HEART:  Regular rate rhythm, no murmurs, no rubs, no clicks Abd:  soft, positive bowel sounds, no organomegally, no rebound, no guarding Ext:  2 plus pulses, no edema, no cyanosis, no clubbing Skin:  No rashes no nodules Neuro:  CN II through XII intact, motor grossly intact  DEVICE  Normal device function.  See PaceArt for details.   Assess/Plan: 1. PPM - her medtronic DDD PPM is working normally. We will recheck in several months. 2. CHB - she is asymptomatic, s/p PPM insertion. 3. HTN  - her bp is well controlled. No change in  her meds. 4. PAF - she is maintaining NSR over 99.9%. She will continue her current meds.   Mikle Bosworth.D.

## 2019-07-02 NOTE — Patient Instructions (Signed)
Medication Instructions:  Your physician recommends that you continue on your current medications as directed. Please refer to the Current Medication list given to you today.  Labwork: None ordered.  Testing/Procedures: None ordered.  Follow-Up: Your physician wants you to follow-up in: one year with Dr. Lovena Le.   You will receive a reminder letter in the mail two months in advance. If you don't receive a letter, please call our office to schedule the follow-up appointment.  Remote monitoring is used to monitor your Pacemaker from home. This monitoring reduces the number of office visits required to check your device to one time per year. It allows [place] to keep an eye on the functioning of your device to ensure it is working properly. You are scheduled for a device check from home on 08/31/2019. You may send your transmission at any time that day. If you have a wireless device, the transmission will be sent automatically. After your physician reviews your transmission, you will receive a postcard with your next transmission date.  Any Other Special Instructions Will Be Listed Below (If Applicable).  If you need a refill on your cardiac medications before your next appointment, please call your pharmacy.

## 2019-07-24 ENCOUNTER — Other Ambulatory Visit: Payer: Self-pay | Admitting: Internal Medicine

## 2019-07-24 MED ORDER — MONTELUKAST SODIUM 10 MG PO TABS: 10.0000 mg | ORAL_TABLET | Freq: Every day | ORAL | 1 refills | Status: DC

## 2019-07-26 ENCOUNTER — Other Ambulatory Visit: Payer: Self-pay | Admitting: Internal Medicine

## 2019-08-04 ENCOUNTER — Other Ambulatory Visit: Payer: Self-pay | Admitting: Internal Medicine

## 2019-08-04 DIAGNOSIS — Z1231 Encounter for screening mammogram for malignant neoplasm of breast: Secondary | ICD-10-CM

## 2019-08-06 ENCOUNTER — Ambulatory Visit (INDEPENDENT_AMBULATORY_CARE_PROVIDER_SITE_OTHER): Payer: Medicare Other

## 2019-08-06 ENCOUNTER — Other Ambulatory Visit: Payer: Self-pay

## 2019-08-06 DIAGNOSIS — Z23 Encounter for immunization: Secondary | ICD-10-CM

## 2019-08-10 ENCOUNTER — Other Ambulatory Visit: Payer: Self-pay | Admitting: Internal Medicine

## 2019-08-10 MED ORDER — ESTRADIOL 0.1 MG/GM VA CREA: TOPICAL_CREAM | VAGINAL | 5 refills | Status: DC

## 2019-08-11 ENCOUNTER — Telehealth: Payer: Self-pay | Admitting: *Deleted

## 2019-08-11 MED ORDER — ESTRADIOL 0.1 MG/GM VA CREA: TOPICAL_CREAM | VAGINAL | 5 refills | Status: DC

## 2019-08-11 NOTE — Telephone Encounter (Signed)
Copied from [place] [phone]. Topic: Quick Communication - See Telephone Encounter >> Aug 10, 2019  5:02 PM Loma [place] wrote: CRM for notification. See Telephone encounter for: 08/10/19.ESTRACE VAGINAL 0.1 MG/GM vaginal cream pls call [facility]/ Altha Harm, she wants to disperse the genic, not covered and pt wants the cheaper since out of pocket pls give her the go ahead at [phone]

## 2019-08-11 NOTE — Telephone Encounter (Signed)
Med sent as generic ok

## 2019-08-11 NOTE — Telephone Encounter (Signed)
Gave verbal to switch to generic since pt is paying out of pocket.

## 2019-08-23 ENCOUNTER — Other Ambulatory Visit (INDEPENDENT_AMBULATORY_CARE_PROVIDER_SITE_OTHER): Payer: Medicare Other

## 2019-08-23 ENCOUNTER — Other Ambulatory Visit: Payer: Self-pay

## 2019-08-23 DIAGNOSIS — D509 Iron deficiency anemia, unspecified: Secondary | ICD-10-CM | POA: Diagnosis not present

## 2019-08-23 LAB — IRON,TIBC AND FERRITIN PANEL
%SAT: 26 % (calc) (ref 16–45)
Ferritin: 53 ng/mL (ref 16–288)
Iron: 67 ug/dL (ref 45–160)
TIBC: 257 mcg/dL (calc) (ref 250–450)

## 2019-08-23 LAB — CBC WITH DIFFERENTIAL/PLATELET
Basophils Absolute: 0 10*3/uL (ref 0.0–0.1)
Basophils Relative: 0.5 % (ref 0.0–3.0)
Eosinophils Absolute: 0.1 10*3/uL (ref 0.0–0.7)
Eosinophils Relative: 2.1 % (ref 0.0–5.0)
HCT: 35.9 % — ABNORMAL LOW (ref 36.0–46.0)
Hemoglobin: 12.1 g/dL (ref 12.0–15.0)
Lymphocytes Relative: 37.6 % (ref 12.0–46.0)
Lymphs Abs: 2 10*3/uL (ref 0.7–4.0)
MCHC: 33.6 g/dL (ref 30.0–36.0)
MCV: 93.9 fl (ref 78.0–100.0)
Monocytes Absolute: 0.3 10*3/uL (ref 0.1–1.0)
Monocytes Relative: 6.5 % (ref 3.0–12.0)
Neutro Abs: 2.8 10*3/uL (ref 1.4–7.7)
Neutrophils Relative %: 53.3 % (ref 43.0–77.0)
Platelets: 112 10*3/uL — ABNORMAL LOW (ref 150.0–400.0)
RBC: 3.82 Mil/uL — ABNORMAL LOW (ref 3.87–5.11)
RDW: 13.3 % (ref 11.5–15.5)
WBC: 5.3 10*3/uL (ref 4.0–10.5)

## 2019-08-31 ENCOUNTER — Ambulatory Visit (INDEPENDENT_AMBULATORY_CARE_PROVIDER_SITE_OTHER): Payer: Medicare Other | Admitting: *Deleted

## 2019-08-31 DIAGNOSIS — I495 Sick sinus syndrome: Secondary | ICD-10-CM

## 2019-08-31 DIAGNOSIS — I4819 Other persistent atrial fibrillation: Secondary | ICD-10-CM

## 2019-08-31 LAB — CUP PACEART REMOTE DEVICE CHECK
Battery Impedance: 281 Ohm
Battery Remaining Longevity: 94 mo
Battery Voltage: 2.79 V
Brady Statistic AP VP Percent: 95 %
Brady Statistic AP VS Percent: 0 %
Brady Statistic AS VP Percent: 5 %
Brady Statistic AS VS Percent: 0 %
Date Time Interrogation Session: 20200929123214
Implantable Lead Implant Date: 20161205
Implantable Lead Implant Date: 20170321
Implantable Lead Location: 753859
Implantable Lead Location: 753860
Implantable Lead Model: 5076
Implantable Lead Model: 5076
Implantable Pulse Generator Implant Date: 20161205
Lead Channel Impedance Value: 446 Ohm
Lead Channel Impedance Value: 776 Ohm
Lead Channel Pacing Threshold Amplitude: 0.5 V
Lead Channel Pacing Threshold Amplitude: 0.625 V
Lead Channel Pacing Threshold Pulse Width: 0.4 ms
Lead Channel Pacing Threshold Pulse Width: 0.4 ms
Lead Channel Setting Pacing Amplitude: 2 V
Lead Channel Setting Pacing Amplitude: 2.5 V
Lead Channel Setting Pacing Pulse Width: 0.4 ms
Lead Channel Setting Sensing Sensitivity: 4 mV

## 2019-09-08 ENCOUNTER — Ambulatory Visit
Admission: RE | Admit: 2019-09-08 | Discharge: 2019-09-08 | Disposition: A | Discharge status: Home / Self Care | Payer: Medicare Other | Source: Ambulatory Visit | Attending: Internal Medicine | Admitting: Internal Medicine

## 2019-09-08 DIAGNOSIS — Z1231 Encounter for screening mammogram for malignant neoplasm of breast: Secondary | ICD-10-CM

## 2019-09-10 NOTE — Progress Notes (Signed)
Remote pacemaker transmission.   

## 2019-09-17 ENCOUNTER — Other Ambulatory Visit: Payer: Self-pay

## 2019-09-17 DIAGNOSIS — Z20822 Contact with and (suspected) exposure to covid-19: Secondary | ICD-10-CM

## 2019-09-19 LAB — NOVEL CORONAVIRUS, NAA: SARS-CoV-2, NAA: NOT DETECTED

## 2019-09-20 ENCOUNTER — Encounter: Payer: Self-pay | Admitting: Internal Medicine

## 2019-09-20 ENCOUNTER — Other Ambulatory Visit: Payer: Self-pay

## 2019-09-20 ENCOUNTER — Ambulatory Visit (INDEPENDENT_AMBULATORY_CARE_PROVIDER_SITE_OTHER): Payer: Medicare Other | Admitting: Internal Medicine

## 2019-09-20 ENCOUNTER — Telehealth: Payer: Self-pay | Admitting: Internal Medicine

## 2019-09-20 DIAGNOSIS — J069 Acute upper respiratory infection, unspecified: Secondary | ICD-10-CM | POA: Diagnosis not present

## 2019-09-20 DIAGNOSIS — Z20822 Contact with and (suspected) exposure to covid-19: Secondary | ICD-10-CM

## 2019-09-20 MED ORDER — BENZONATATE 200 MG PO CAPS: 200.0000 mg | ORAL_CAPSULE | Freq: Three times a day (TID) | ORAL | 0 refills | Status: DC | PRN

## 2019-09-20 NOTE — Progress Notes (Signed)
Virtual Visit via [email or web address]  This visit type was conducted due to national recommendations for restrictions regarding the COVID-19 pandemic (e.g. social distancing).  This format is felt to be most appropriate for this patient at this time.  All issues noted in this document were discussed and addressed.  No physical exam was performed (except for noted visual exam findings with Video Visits).   I connected with@ on 09/20/19 at  4:00 PM EDT by a video enabled telemedicine application and verified that I am speaking with the correct person using two identifiers. Location patient: home Location provider: work or home office Persons participating in the virtual visit: patient, provider  I discussed the limitations, risks, security and privacy concerns of performing an evaluation and management service by telephone and the availability of in person appointments. I also discussed with the patient that there may be a patient responsible charge related to this service. The patient expressed understanding and agreed to proceed.    Reason for visit: cough productive of phlegm for the past 5 days    HPI:  78 yr old female with history of seasonal rhinitis developed congestion,  Rhinitis and occasional sneezing after participating in an outdoor yoga program at [facility] .  Had a very comfortable COVID 19 TEST AT [facility] on Oct 16  WHICH WAS NEGATIVE .  Has been  AFEBRILE .  COUGH STARTED ON fRIDAY . Productive of clear phlegm a not keeping her up at night.   Has been isolating  since Thursday.  Does not go outside without a mask but had 2 doctor's appointments /mammograms in the recent past  OCULAR MIGRAINES HAVE STOPPED since starting lamictal .     ROS: See pertinent positives and negatives per HPI.  Past Medical History:  Diagnosis Date  . Acute on chronic diastolic (congestive) heart failure ([place])   . Allergy    See list  . Anemia 2018  . Anxiety    Occasionally take Xanax for sleep  . Anxiety  associated with depression    Prn alprazolam   . Anxiety associated with depression   . Arthritis    Hands, Back  . Atrial fibrillation, persistent ([place])    DCCV 08/22/2015  . Bradycardia post-op bradycardia, pacer dependent   MDT PPM 11/06/15, Dr. Lovena Le  . Cataract    Left eye  . Diverticulosis   . Focal seizures ([place])   . Heart murmur   . Hemorrhoid   . Hepatic cyst    innumerable  . 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 Hyperlipidemia   . IBS (irritable bowel syndrome)   . 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 Migraine   . MVP (mitral valve prolapse)   . Nodule of right lung   . Osteoporosis of forearm   . RBBB   . Restrictive lung disease    Mild on PFT & likely cardiac in etiology   . S/P Minimally invasive maze operation for atrial fibrillation 10/31/2015   Complete bilateral atrial lesion set using cryothermy and bipolar radiofrequency ablation with clipping of LA appendage via right mini thoracotomy approach  . 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 Severe mitral regurgitation   . 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 SVT (supraventricular tachycardia) ([place])   . Thoracic aorta atherosclerosis ([place])   . TIA (transient ischemic attack)  Past Surgical History:  Procedure Laterality Date  . BREAST EXCISIONAL BIOPSY Right Late 80s   Negative X2  . CARDIAC CATHETERIZATION N/A 10/18/2015   Procedure: Right/Left Heart Cath and Coronary Angiography;  Surgeon: Sherren Mocha, MD;  Location: [place] [facility];  Service: Cardiovascular;  Laterality: N/A;  . CARDIOVERSION N/A 08/22/2015   Procedure: CARDIOVERSION;  Surgeon: Thayer Headings, MD;  Location: [place];  Service: Cardiovascular;  Laterality: N/A;  . COLONOSCOPY  2003  . EP IMPLANTABLE DEVICE N/A 11/06/2015   Procedure: Pacemaker  Implant;  Surgeon: Evans Lance, MD;  Location: [place] [facility];  Service: Cardiovascular;  Laterality: N/A;  . EP IMPLANTABLE DEVICE N/A 02/20/2016   Procedure: Lead Extraction;  Surgeon: Evans Lance, MD;  Location: [place] [facility];  Service: Cardiovascular;  Laterality: N/A;  . EYE SURGERY Right 2013  . FOOT SURGERY    . MANDIBLE FRACTURE SURGERY  03-26-13  . MINIMALLY INVASIVE MAZE PROCEDURE N/A 10/31/2015   Procedure: MINIMALLY INVASIVE MAZE PROCEDURE;  Surgeon: Rexene Alberts, MD;  Location: [place];  Service: Open Heart Surgery;  Laterality: N/A;  . MITRAL VALVE REPLACEMENT Right 10/31/2015   Procedure: MINIMALLY INVASIVE MITRAL VALVE (MV) REPLACEMENT;  Surgeon: Rexene Alberts, MD;  Location: [place];  Service: Open Heart Surgery;  Laterality: Right;  . PACEMAKER LEAD REMOVAL  02/20/2016  . TEE WITH CARDIOVERSION    . TEE WITHOUT CARDIOVERSION N/A 08/22/2015   Procedure: TRANSESOPHAGEAL ECHOCARDIOGRAM (TEE);  Surgeon: Thayer Headings, MD;  Location: [place];  Service: Cardiovascular;  Laterality: N/A;  . TEE WITHOUT CARDIOVERSION N/A 10/31/2015   Procedure: TRANSESOPHAGEAL ECHOCARDIOGRAM (TEE);  Surgeon: Rexene Alberts, MD;  Location: [place];  Service: Open Heart Surgery;  Laterality: N/A;  . TUBAL LIGATION    . VARICOSE VEIN SURGERY Right     Family History  Problem Relation Age of Onset  . Hypertension Mother   . Arrhythmia Mother   . Heart failure Mother   . Arrhythmia Brother   . Stroke Brother 62       cerebral hemorrhage, nonsmoker, no HTN  . Prostate cancer Brother   . Stroke Father        from an aneurysm  . Stroke Maternal Aunt 83       cerebral hemorrhage  . Liver cancer Maternal Grandmother   . Atrial fibrillation Son   . Celiac disease Son   . Heart attack Neg Hx   . Breast cancer Neg Hx     SOCIAL HX:  reports that she has never smoked. She has never used smokeless tobacco. She reports that she does not drink alcohol or use drugs.  Current  Outpatient Medications:  .  Ascorbic Acid (VITAMIN C) 1000 MG tablet, Take 11 tablets by mouth daily., Disp: , Rfl:  .  b complex vitamins tablet, Take 1 tablet by mouth daily., Disp: , Rfl:  .  butalbital-acetaminophen-caffeine (FIORICET) 50-325-40 MG tablet, TAKE 1 TABLET BY MOUTH AS NEEDED FOR BACK PAIN OR MIGRAINE HEADACHE, Disp: 90 tablet, Rfl: 5 .  Cholecalciferol (VITAMIN D) 2000 units CAPS, Take 2,000 Units by mouth daily., Disp: , Rfl:  .  ELIQUIS 5 MG TABS tablet, TAKE ONE TABLET BY MOUTH TWICE DAILY, Disp: 180 tablet, Rfl: 3 .  estradiol (ESTRACE VAGINAL) 0.1 MG/GM vaginal cream, PLACE 1 APPLICATORFUL VAGINALLY  2 NIGHT PER WEEK, Disp: 42.5 g, Rfl: 5 .  fluticasone (FLONASE) 50 MCG/ACT nasal spray, Place 2 sprays into both nostrils daily.,  Disp: 16 g, Rfl: 11 .  folic acid (FOLVITE) A999333 MCG tablet, , Disp: , Rfl:  .  furosemide (LASIX) 40 MG tablet, Take 1 tablet (40 mg total) by mouth as directed., Disp: 30 tablet, Rfl: 11 .  lamoTRIgine (LAMICTAL) 100 MG tablet, Take 100 mg by mouth 2 (two) times daily. , Disp: , Rfl:  .  metoprolol tartrate (LOPRESSOR) 25 MG tablet, TAKE 1/2 TABLET BY MOUTH TWICE DAILY, Disp: 90 tablet, Rfl: 3 .  montelukast (SINGULAIR) 10 MG tablet, Take 1 tablet (10 mg total) by mouth at bedtime., Disp: 90 tablet, Rfl: 1 .  polyethylene glycol powder (MIRALAX) 17 GM/SCOOP powder, Take 1 Dose by mouth daily., Disp: , Rfl:  .  rosuvastatin (CRESTOR) 40 MG tablet, TAKE ONE TABLET BY MOUTH EVERY DAY, Disp: 90 tablet, Rfl: 1 .  triamcinolone cream (KENALOG) 0.1 %, Apply 1 application topically 2 (two) times daily., Disp: 30 g, Rfl: 0 .  VENTOLIN HFA 108 (90 Base) MCG/ACT inhaler, , Disp: , Rfl:  .  amoxicillin (AMOXIL) 500 MG tablet, Take 4 tablets 60 minutes prior to dental procedures (Patient not taking: Reported on 09/20/2019), Disp: 30 tablet, Rfl: 6 .  benzonatate (TESSALON) 200 MG capsule, Take 1 capsule (200 mg total) by mouth 3 (three) times daily as needed for  cough., Disp: 20 capsule, Rfl: 0  EXAM:  VITALS per patient if applicable:  GENERAL: alert, oriented, appears well and in no acute distress  HEENT: atraumatic, conjunttiva clear, no obvious abnormalities on inspection of external nose and ears  NECK: normal movements of the head and neck  LUNGS: on inspection no signs of respiratory distress, breathing rate appears normal, no obvious gross SOB, gasping or wheezing  CV: no obvious cyanosis  MS: moves all visible extremities without noticeable abnormality  PSYCH/NEURO: pleasant and cooperative, no obvious depression or anxiety, speech and thought processing grossly intact  ASSESSMENT AND PLAN:  Discussed the following assessment and plan:  Viral upper respiratory tract infection  URI (upper respiratory infection) Symptomatic treatment ,  First COVID 19 test negative.  Advised to continue isolation and if no improvement by Thursday to have repeat COVID 10 TESTING done.      I discussed the assessment and treatment plan with the patient. The patient was provided an opportunity to ask questions and all were answered. The patient agreed with the plan and demonstrated an understanding of the instructions.   The patient was advised to call back or seek an in-person evaluation if the symptoms worsen or if the condition fails to improve as anticipated.   I provided  25 minutes of non-face-to-face time during this encounter reviewing patient's current problems and post surgeries.  Providing counseling on the above mentioned problems , and coordination  of care . [name], MD

## 2019-09-21 DIAGNOSIS — J069 Acute upper respiratory infection, unspecified: Secondary | ICD-10-CM | POA: Insufficient documentation

## 2019-09-21 NOTE — Telephone Encounter (Signed)
error 

## 2019-09-21 NOTE — Assessment & Plan Note (Signed)
Symptomatic treatment ,  First COVID 19 test negative.  Advised to continue isolation and if no improvement by Thursday to have repeat COVID 10 TESTING done.

## 2019-09-23 ENCOUNTER — Ambulatory Visit: Payer: Medicare Other

## 2019-09-24 ENCOUNTER — Other Ambulatory Visit: Payer: Self-pay | Admitting: Internal Medicine

## 2019-09-27 ENCOUNTER — Other Ambulatory Visit: Payer: Self-pay

## 2019-09-27 ENCOUNTER — Encounter: Payer: Self-pay | Admitting: Internal Medicine

## 2019-09-27 ENCOUNTER — Ambulatory Visit (INDEPENDENT_AMBULATORY_CARE_PROVIDER_SITE_OTHER): Payer: Medicare Other | Admitting: Internal Medicine

## 2019-09-27 DIAGNOSIS — J069 Acute upper respiratory infection, unspecified: Secondary | ICD-10-CM | POA: Diagnosis not present

## 2019-09-27 MED ORDER — BENZONATATE 200 MG PO CAPS: 200.0000 mg | ORAL_CAPSULE | Freq: Three times a day (TID) | ORAL | 0 refills | Status: DC | PRN

## 2019-09-27 MED ORDER — AZITHROMYCIN 500 MG PO TABS: 500.0000 mg | ORAL_TABLET | Freq: Every day | ORAL | 0 refills | Status: DC

## 2019-09-27 MED ORDER — PREDNISONE 10 MG PO TABS: ORAL_TABLET | ORAL | 0 refills | Status: DC

## 2019-09-27 MED ORDER — AZELASTINE HCL 0.1 % NA SOLN: 2.0000 | Freq: Two times a day (BID) | NASAL | 12 refills | Status: DC

## 2019-09-27 NOTE — Progress Notes (Signed)
Virtual Visit via [email or web address] Note  This visit type was conducted due to national recommendations for restrictions regarding the COVID-19 pandemic (e.g. social distancing).  This format is felt to be most appropriate for this patient at this time.  All issues noted in this document were discussed and addressed.  No physical exam was performed (except for noted visual exam findings with Video Visits).   I connected with@ on 09/27/19 at 10:00 AM EDT by a video enabled telemedicine application  and verified that I am speaking with the correct person using two identifiers. Location patient: home Location provider: work or home office Persons participating in the virtual visit: patient, provider  I discussed the limitations, risks, security and privacy concerns of performing an evaluation and management service by telephone and the availability of in person appointments. I also discussed with the patient that there may be a patient responsible charge related to this service. The patient expressed understanding and agreed to proceed.  Reason for visit: productive cough for the past  15 days   HPI:  78 yr old with 2 week  History of cough which has been paroxysmal at time.  Had a brief COVID 19  exposure at [facility] [place]  Got tested on oct 16 and negative.     Cough has been worse in th evenings  ,  Even  before bedtime,  Coughs from 11 pm to 2 am most nights  No dypsnea,  Fevers, pleurisy .  Using albuterol MDI causes her to cough.  Not taking an antihistamine due to seizure history     ROS: See pertinent positives and negatives per HPI.  Past Medical History:  Diagnosis Date  . Acute on chronic diastolic (congestive) heart failure ([place])   . Allergy    See list  . Anemia 2018  . Anxiety    Occasionally take Xanax for sleep  . Anxiety associated with depression    Prn alprazolam   . Anxiety associated with depression   . Arthritis    Hands, Back  . Atrial fibrillation, persistent ([place])    DCCV  08/22/2015  . Bradycardia post-op bradycardia, pacer dependent   MDT PPM 11/06/15, Dr. Lovena Le  . Cataract    Left eye  . Diverticulosis   . Focal seizures ([place])   . Heart murmur   . Hemorrhoid   . Hepatic cyst    innumerable  . 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 Hyperlipidemia   . IBS (irritable bowel syndrome)   . 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 Migraine   . MVP (mitral valve prolapse)   . Nodule of right lung   . Osteoporosis of forearm   . RBBB   . Restrictive lung disease    Mild on PFT & likely cardiac in etiology   . S/P Minimally invasive maze operation for atrial fibrillation 10/31/2015   Complete bilateral atrial lesion set using cryothermy and bipolar radiofrequency ablation with clipping of LA appendage via right mini thoracotomy approach  . 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 Severe mitral regurgitation   . 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 SVT (supraventricular tachycardia) ([place])   . Thoracic aorta atherosclerosis ([place])   . TIA (transient ischemic attack)     Past Surgical History:  Procedure Laterality Date  . BREAST EXCISIONAL BIOPSY Right Late 80s  Negative X2  . CARDIAC CATHETERIZATION N/A 10/18/2015   Procedure: Right/Left Heart Cath and Coronary Angiography;  Surgeon: Sherren Mocha, MD;  Location: [place] [facility];  Service: Cardiovascular;  Laterality: N/A;  . CARDIOVERSION N/A 08/22/2015   Procedure: CARDIOVERSION;  Surgeon: Thayer Headings, MD;  Location: [place];  Service: Cardiovascular;  Laterality: N/A;  . COLONOSCOPY  2003  . EP IMPLANTABLE DEVICE N/A 11/06/2015   Procedure: Pacemaker Implant;  Surgeon: Evans Lance, MD;  Location: [place] [facility];  Service: Cardiovascular;  Laterality: N/A;  . EP IMPLANTABLE DEVICE N/A 02/20/2016   Procedure:  Lead Extraction;  Surgeon: Evans Lance, MD;  Location: [place] [facility];  Service: Cardiovascular;  Laterality: N/A;  . EYE SURGERY Right 2013  . FOOT SURGERY    . MANDIBLE FRACTURE SURGERY  03-26-13  . MINIMALLY INVASIVE MAZE PROCEDURE N/A 10/31/2015   Procedure: MINIMALLY INVASIVE MAZE PROCEDURE;  Surgeon: Rexene Alberts, MD;  Location: [place];  Service: Open Heart Surgery;  Laterality: N/A;  . MITRAL VALVE REPLACEMENT Right 10/31/2015   Procedure: MINIMALLY INVASIVE MITRAL VALVE (MV) REPLACEMENT;  Surgeon: Rexene Alberts, MD;  Location: [place];  Service: Open Heart Surgery;  Laterality: Right;  . PACEMAKER LEAD REMOVAL  02/20/2016  . TEE WITH CARDIOVERSION    . TEE WITHOUT CARDIOVERSION N/A 08/22/2015   Procedure: TRANSESOPHAGEAL ECHOCARDIOGRAM (TEE);  Surgeon: Thayer Headings, MD;  Location: [place];  Service: Cardiovascular;  Laterality: N/A;  . TEE WITHOUT CARDIOVERSION N/A 10/31/2015   Procedure: TRANSESOPHAGEAL ECHOCARDIOGRAM (TEE);  Surgeon: Rexene Alberts, MD;  Location: [place];  Service: Open Heart Surgery;  Laterality: N/A;  . TUBAL LIGATION    . VARICOSE VEIN SURGERY Right     Family History  Problem Relation Age of Onset  . Hypertension Mother   . Arrhythmia Mother   . Heart failure Mother   . Arrhythmia Brother   . Stroke Brother 15       cerebral hemorrhage, nonsmoker, no HTN  . Prostate cancer Brother   . Stroke Father        from an aneurysm  . Stroke Maternal Aunt 83       cerebral hemorrhage  . Liver cancer Maternal Grandmother   . Atrial fibrillation Son   . Celiac disease Son   . Heart attack Neg Hx   . Breast cancer Neg Hx     SOCIAL HX:  reports that she has never smoked. She has never used smokeless tobacco. She reports that she does not drink alcohol or use drugs.   Current Outpatient Medications:  .  Ascorbic Acid (VITAMIN C) 1000 MG tablet, Take 11 tablets by mouth daily., Disp: , Rfl:  .  b complex vitamins tablet, Take 1 tablet by mouth  daily., Disp: , Rfl:  .  benzonatate (TESSALON) 200 MG capsule, Take 1 capsule (200 mg total) by mouth 3 (three) times daily as needed for cough., Disp: 20 capsule, Rfl: 0 .  butalbital-acetaminophen-caffeine (FIORICET) 50-325-40 MG tablet, TAKE 1 TABLET BY MOUTH AS NEEDED FOR BACK PAIN OR MIGRAINE HEADACHE, Disp: 90 tablet, Rfl: 5 .  Cholecalciferol (VITAMIN D) 2000 units CAPS, Take 2,000 Units by mouth daily., Disp: , Rfl:  .  ELIQUIS 5 MG TABS tablet, TAKE ONE TABLET BY MOUTH TWICE DAILY, Disp: 180 tablet, Rfl: 3 .  estradiol (ESTRACE VAGINAL) 0.1 MG/GM vaginal cream, PLACE 1 APPLICATORFUL VAGINALLY  2 NIGHT PER WEEK, Disp: 42.5 g, Rfl: 5 .  fluticasone (  FLONASE) 50 MCG/ACT nasal spray, Place 2 sprays into both nostrils daily., Disp: 16 g, Rfl: 11 .  folic acid (FOLVITE) A999333 MCG tablet, , Disp: , Rfl:  .  furosemide (LASIX) 40 MG tablet, Take 1 tablet (40 mg total) by mouth as directed., Disp: 30 tablet, Rfl: 11 .  lamoTRIgine (LAMICTAL) 100 MG tablet, Take 100 mg by mouth 2 (two) times daily. , Disp: , Rfl:  .  metoprolol tartrate (LOPRESSOR) 25 MG tablet, TAKE 1/2 TABLET BY MOUTH TWICE DAILY, Disp: 90 tablet, Rfl: 3 .  montelukast (SINGULAIR) 10 MG tablet, Take 1 tablet (10 mg total) by mouth at bedtime., Disp: 90 tablet, Rfl: 1 .  polyethylene glycol powder (MIRALAX) 17 GM/SCOOP powder, Take 1 Dose by mouth daily., Disp: , Rfl:  .  rosuvastatin (CRESTOR) 40 MG tablet, TAKE ONE TABLET BY MOUTH EVERY DAY, Disp: 90 tablet, Rfl: 1 .  triamcinolone cream (KENALOG) 0.1 %, Apply 1 application topically 2 (two) times daily., Disp: 30 g, Rfl: 0 .  VENTOLIN HFA 108 (90 Base) MCG/ACT inhaler, , Disp: , Rfl:  .  amoxicillin (AMOXIL) 500 MG tablet, Take 4 tablets 60 minutes prior to dental procedures (Patient not taking: Reported on 09/20/2019), Disp: 30 tablet, Rfl: 6 .  azelastine (ASTELIN) 0.1 % nasal spray, Place 2 sprays into both nostrils 2 (two) times daily. Use in each nostril as directed, Disp: 30  mL, Rfl: 12 .  azithromycin (ZITHROMAX) 500 MG tablet, Take 1 tablet (500 mg total) by mouth daily., Disp: 7 tablet, Rfl: 0 .  predniSONE (DELTASONE) 10 MG tablet, 6 tablets daily for 3 days , then reduce by 1 tablet daily until gone, Disp: 33 tablet, Rfl: 0  EXAM:  VITALS per patient if applicable:  GENERAL: alert, oriented, appears well and in no acute distress  HEENT: atraumatic, conjunttiva clear, no obvious abnormalities on inspection of external nose and ears  NECK: normal movements of the head and neck  LUNGS: on inspection no signs of respiratory distress, breathing rate appears normal, no obvious gross SOB, gasping or wheezing  CV: no obvious cyanosis  MS: moves all visible extremities without noticeable abnormality  PSYCH/NEURO: pleasant and cooperative, no obvious depression or anxiety, speech and thought processing grossly intact  ASSESSMENT AND PLAN:  Discussed the following assessment and plan:  Viral upper respiratory tract infection  URI (upper respiratory infection) She has had bronchitis now for two weeks. Negative COVID 19 TEST ON OCT 16 following a brief expsure.  Has been quarantining since oct 15.  Will add prednisone,  Azithromycin ,  Azelastine and refill tessalon.   Chest x ray 1 week of not resolved.      I discussed the assessment and treatment plan with the patient. The patient was provided an opportunity to ask questions and all were answered. The patient agreed with the plan and demonstrated an understanding of the instructions.   The patient was advised to call back or seek an in-person evaluation if the symptoms worsen or if the condition fails to improve as anticipated.   I provided 15 minutes of non-face-to-face time during this encounter reviewing patient's current problems past medical history , providing counseling on the above mentioned problems , and coordination  of care .  [name], MD

## 2019-09-27 NOTE — Assessment & Plan Note (Addendum)
She has had bronchitis now for two weeks. Negative COVID 19 TEST ON OCT 16 following a brief expsure.  Has been quarantining since oct 15.  Will add prednisone,  Azithromycin ,  Azelastine and refill tessalon.   Chest x ray 1 week of not resolved.

## 2019-10-01 ENCOUNTER — Telehealth: Payer: Self-pay

## 2019-10-01 NOTE — Telephone Encounter (Signed)
Copied from [place] [id number]. Topic: General - Other >> Oct 01, 2019  1:06 PM Mcneil, Ja-Kwan wrote: Reason for CRM: Pt called to report that she is doing better and she will continue the medications as instructed.

## 2019-10-04 ENCOUNTER — Other Ambulatory Visit: Payer: Self-pay | Admitting: Internal Medicine

## 2019-10-07 ENCOUNTER — Other Ambulatory Visit: Payer: Self-pay

## 2019-10-08 ENCOUNTER — Ambulatory Visit (INDEPENDENT_AMBULATORY_CARE_PROVIDER_SITE_OTHER): Payer: Medicare Other | Admitting: Vascular Surgery

## 2019-10-08 ENCOUNTER — Encounter (INDEPENDENT_AMBULATORY_CARE_PROVIDER_SITE_OTHER): Payer: Medicare Other

## 2019-10-11 ENCOUNTER — Other Ambulatory Visit (INDEPENDENT_AMBULATORY_CARE_PROVIDER_SITE_OTHER): Payer: Medicare Other

## 2019-10-11 ENCOUNTER — Other Ambulatory Visit: Payer: Self-pay

## 2019-10-11 DIAGNOSIS — Z20822 Contact with and (suspected) exposure to covid-19: Secondary | ICD-10-CM

## 2019-10-11 DIAGNOSIS — Z20828 Contact with and (suspected) exposure to other viral communicable diseases: Secondary | ICD-10-CM | POA: Diagnosis not present

## 2019-10-11 LAB — SARS-COV-2 IGG: SARS-COV-2 IgG: 0.03

## 2019-10-18 ENCOUNTER — Other Ambulatory Visit: Payer: Self-pay

## 2019-10-18 ENCOUNTER — Ambulatory Visit (INDEPENDENT_AMBULATORY_CARE_PROVIDER_SITE_OTHER): Payer: Medicare Other | Admitting: *Deleted

## 2019-10-18 DIAGNOSIS — M810 Age-related osteoporosis without current pathological fracture: Secondary | ICD-10-CM | POA: Diagnosis not present

## 2019-10-18 MED ORDER — DENOSUMAB 60 MG/ML ~~LOC~~ SOSY
60.0000 mg | PREFILLED_SYRINGE | Freq: Once | SUBCUTANEOUS | Status: AC
  Administered 2019-10-18: 60 mg via SUBCUTANEOUS

## 2019-10-18 NOTE — Progress Notes (Signed)
Patient presented for Prolia injection to Right arm [place], patient voiced no concerns or complaints during or after injection. 

## 2019-10-27 ENCOUNTER — Encounter (HOSPITAL_COMMUNITY): Payer: Self-pay | Admitting: Nurse Practitioner

## 2019-10-27 ENCOUNTER — Ambulatory Visit (HOSPITAL_COMMUNITY)
Admission: RE | Admit: 2019-10-27 | Discharge: 2019-10-27 | Disposition: A | Discharge status: Home / Self Care | Payer: Medicare Other | Source: Ambulatory Visit | Attending: Nurse Practitioner | Admitting: Nurse Practitioner

## 2019-10-27 ENCOUNTER — Other Ambulatory Visit: Payer: Self-pay

## 2019-10-27 VITALS — BP 118/74 | HR 79 | Ht 65.0 in | Wt 143.4 lb

## 2019-10-27 DIAGNOSIS — Z886 Allergy status to analgesic agent status: Secondary | ICD-10-CM | POA: Insufficient documentation

## 2019-10-27 DIAGNOSIS — Z884 Allergy status to anesthetic agent status: Secondary | ICD-10-CM | POA: Diagnosis not present

## 2019-10-27 DIAGNOSIS — R9431 Abnormal electrocardiogram [ECG] [EKG]: Secondary | ICD-10-CM | POA: Insufficient documentation

## 2019-10-27 DIAGNOSIS — M47819 Spondylosis without myelopathy or radiculopathy, site unspecified: Secondary | ICD-10-CM | POA: Insufficient documentation

## 2019-10-27 DIAGNOSIS — Z823 Family history of stroke: Secondary | ICD-10-CM | POA: Diagnosis not present

## 2019-10-27 DIAGNOSIS — I471 Supraventricular tachycardia: Secondary | ICD-10-CM | POA: Insufficient documentation

## 2019-10-27 DIAGNOSIS — Z8673 Personal history of transient ischemic attack (TIA), and cerebral infarction without residual deficits: Secondary | ICD-10-CM | POA: Diagnosis not present

## 2019-10-27 DIAGNOSIS — F419 Anxiety disorder, unspecified: Secondary | ICD-10-CM | POA: Insufficient documentation

## 2019-10-27 DIAGNOSIS — I4510 Unspecified right bundle-branch block: Secondary | ICD-10-CM | POA: Diagnosis not present

## 2019-10-27 DIAGNOSIS — M19049 Primary osteoarthritis, unspecified hand: Secondary | ICD-10-CM | POA: Insufficient documentation

## 2019-10-27 DIAGNOSIS — I340 Nonrheumatic mitral (valve) insufficiency: Secondary | ICD-10-CM | POA: Diagnosis not present

## 2019-10-27 DIAGNOSIS — Z85828 Personal history of other malignant neoplasm of skin: Secondary | ICD-10-CM | POA: Insufficient documentation

## 2019-10-27 DIAGNOSIS — Z79899 Other long term (current) drug therapy: Secondary | ICD-10-CM | POA: Insufficient documentation

## 2019-10-27 DIAGNOSIS — D649 Anemia, unspecified: Secondary | ICD-10-CM | POA: Insufficient documentation

## 2019-10-27 DIAGNOSIS — Z888 Allergy status to other drugs, medicaments and biological substances status: Secondary | ICD-10-CM | POA: Diagnosis not present

## 2019-10-27 DIAGNOSIS — Z885 Allergy status to narcotic agent status: Secondary | ICD-10-CM | POA: Diagnosis not present

## 2019-10-27 DIAGNOSIS — I4819 Other persistent atrial fibrillation: Secondary | ICD-10-CM | POA: Insufficient documentation

## 2019-10-27 DIAGNOSIS — M810 Age-related osteoporosis without current pathological fracture: Secondary | ICD-10-CM | POA: Insufficient documentation

## 2019-10-27 DIAGNOSIS — Z792 Long term (current) use of antibiotics: Secondary | ICD-10-CM | POA: Diagnosis not present

## 2019-10-27 DIAGNOSIS — Z8042 Family history of malignant neoplasm of prostate: Secondary | ICD-10-CM | POA: Diagnosis not present

## 2019-10-27 DIAGNOSIS — Z7901 Long term (current) use of anticoagulants: Secondary | ICD-10-CM | POA: Diagnosis not present

## 2019-10-27 DIAGNOSIS — E785 Hyperlipidemia, unspecified: Secondary | ICD-10-CM | POA: Diagnosis not present

## 2019-10-27 DIAGNOSIS — I700 Atherosclerosis of aorta: Secondary | ICD-10-CM | POA: Diagnosis not present

## 2019-10-27 DIAGNOSIS — Z953 Presence of xenogenic heart valve: Secondary | ICD-10-CM | POA: Insufficient documentation

## 2019-10-27 DIAGNOSIS — Z7952 Long term (current) use of systemic steroids: Secondary | ICD-10-CM | POA: Diagnosis not present

## 2019-10-27 DIAGNOSIS — Z8249 Family history of ischemic heart disease and other diseases of the circulatory system: Secondary | ICD-10-CM | POA: Diagnosis not present

## 2019-10-27 DIAGNOSIS — Z800 Family history of malignant neoplasm of digestive organs: Secondary | ICD-10-CM | POA: Insufficient documentation

## 2019-10-27 DIAGNOSIS — Z881 Allergy status to other antibiotic agents status: Secondary | ICD-10-CM | POA: Diagnosis not present

## 2019-10-27 NOTE — Progress Notes (Signed)
Primary Care Physician: [name], MD Referring Physician: [name] is a 78 y.o. 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 So far, she has been maintaining  SR since the maze.  F/u in afib clinic, 10/27/19, one year f/u Maze surveillance, she continues to do well, staying in [place]. Paceart reflects this as well. 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   Today, she denies symptoms of palpitations, chest pain, shortness of breath, orthopnea, PND, lower extremity edema, dizziness, presyncope, syncope, or neurologic sequela. The patient is tolerating medications without difficulties and is otherwise without complaint today.   Past Medical History:  Diagnosis Date  . Acute on chronic diastolic (congestive) heart failure ([place])   . Allergy    See list  . Anemia 2018  . Anxiety    Occasionally take Xanax for sleep  . Anxiety associated with depression    Prn alprazolam   . Anxiety associated with depression   . Arthritis    Hands, Back  . Atrial fibrillation, persistent ([place])    DCCV 08/22/2015  . Bradycardia post-op bradycardia, pacer dependent   MDT PPM 11/06/15, Dr. Lovena Le  . Cataract    Left eye  . Diverticulosis   . Focal seizures ([place])   . Heart murmur   . Hemorrhoid   . Hepatic cyst    innumerable  . 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 Hyperlipidemia   . IBS (irritable bowel  syndrome)   . 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 Migraine   . MVP (mitral valve prolapse)   . Nodule of right lung   . Osteoporosis of forearm   . RBBB   . Restrictive lung disease    Mild on PFT & likely cardiac in etiology   . S/P Minimally invasive maze operation for atrial fibrillation 10/31/2015   Complete bilateral atrial lesion set using cryothermy and bipolar radiofrequency ablation with clipping of LA appendage via right mini thoracotomy approach  . S/P minimally invasive mitral valve replacement with bioprosthetic valve 10/31/2015   33 mm [facility] Mitral bovine bioprosthetic tissue valve placed via right mini thoracotomy approach  . Severe mitral regurgitation   . 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 SVT (supraventricular tachycardia) ([place])   . Thoracic aorta atherosclerosis ([place])   . TIA (transient ischemic attack)    Past Surgical History:  Procedure Laterality Date  . BREAST EXCISIONAL BIOPSY Right Late 80s   Negative X2  . CARDIAC CATHETERIZATION N/A 10/18/2015   Procedure: Right/Left Heart Cath and Coronary Angiography;  Surgeon: Sherren Mocha, MD;  Location: [facility];  Service: Cardiovascular;  Laterality: N/A;  . CARDIOVERSION N/A 08/22/2015   Procedure: CARDIOVERSION;  Surgeon: Thayer Headings, MD;  Location: [facility];  Service: Cardiovascular;  Laterality: N/A;  . COLONOSCOPY  2003  . EP IMPLANTABLE DEVICE N/A 11/06/2015   Procedure: Pacemaker Implant;  Surgeon: Evans Lance, MD;  Location: [place] [facility];  Service: Cardiovascular;  Laterality: N/A;  . EP IMPLANTABLE DEVICE N/A 02/20/2016   Procedure: Lead Extraction;  Surgeon: Evans Lance, MD;  Location: [facility];  Service: Cardiovascular;  Laterality: N/A;  . EYE SURGERY Right 2013  . FOOT SURGERY    . MANDIBLE FRACTURE SURGERY  03-26-13  . MINIMALLY INVASIVE MAZE PROCEDURE N/A 10/31/2015   Procedure: MINIMALLY INVASIVE MAZE PROCEDURE;   Surgeon: Rexene Alberts, MD;  Location: [facility];  Service: Open Heart Surgery;  Laterality: N/A;  . MITRAL VALVE REPLACEMENT Right 10/31/2015   Procedure: MINIMALLY INVASIVE MITRAL VALVE (MV) REPLACEMENT;  Surgeon: Rexene Alberts, MD;  Location: [facility];  Service: Open Heart Surgery;  Laterality: Right;  . PACEMAKER LEAD REMOVAL  02/20/2016  . TEE WITH CARDIOVERSION    . TEE WITHOUT CARDIOVERSION N/A 08/22/2015   Procedure: TRANSESOPHAGEAL ECHOCARDIOGRAM (TEE);  Surgeon: Thayer Headings, MD;  Location: [facility];  Service: Cardiovascular;  Laterality: N/A;  . TEE WITHOUT CARDIOVERSION N/A 10/31/2015   Procedure: TRANSESOPHAGEAL ECHOCARDIOGRAM (TEE);  Surgeon: Rexene Alberts, MD;  Location: [facility];  Service: Open Heart Surgery;  Laterality: N/A;  . TUBAL LIGATION    . VARICOSE VEIN SURGERY Right     Current Outpatient Medications  Medication Sig Dispense Refill  . amoxicillin (AMOXIL) 500 MG tablet Take 4 tablets 60 minutes prior to dental procedures 30 tablet 6  . Ascorbic Acid (VITAMIN C) 1000 MG tablet Take 11 tablets by mouth daily.    Marland Kitchen azelastine (ASTELIN) 0.1 % nasal spray Place 2 sprays into both nostrils 2 (two) times daily. Use in each nostril as directed 30 mL 12  . b complex vitamins tablet Take 1 tablet by mouth daily.    . butalbital-acetaminophen-caffeine (FIORICET) 50-325-40 MG tablet TAKE 1 TABLET BY MOUTH AS NEEDED FOR BACK PAIN OR MIGRAINE HEADACHE 90 tablet 5  . Cholecalciferol (VITAMIN D) 2000 units CAPS Take 2,000 Units by mouth daily.    Marland Kitchen ELIQUIS 5 MG TABS tablet TAKE ONE TABLET BY MOUTH TWICE DAILY 180 tablet 3  . estradiol (ESTRACE VAGINAL) 0.1 MG/GM vaginal cream PLACE 1 APPLICATORFUL VAGINALLY  2 NIGHT PER WEEK 42.5 g 5  . fluticasone (FLONASE) 50 MCG/ACT nasal spray Place 2 sprays into both nostrils daily. 16 g 11  . folic acid (FOLVITE) A999333 MCG tablet     . furosemide (LASIX) 40 MG tablet Take 1 tablet (40 mg total) by mouth as directed. 30 tablet 11  .  lamoTRIgine (LAMICTAL) 100 MG tablet Take 100 mg by mouth 2 (two) times daily.     . metoprolol tartrate (LOPRESSOR) 25 MG tablet TAKE 1/2 TABLET BY MOUTH TWICE DAILY 90 tablet 3  . montelukast (SINGULAIR) 10 MG tablet Take 1 tablet (10 mg total) by mouth at bedtime. 90 tablet 1  . polyethylene glycol powder (MIRALAX) 17 GM/SCOOP powder Take 1 Dose by mouth daily.    . rosuvastatin (CRESTOR) 40 MG tablet TAKE ONE TABLET EVERY DAY 90 tablet 1  . triamcinolone cream (KENALOG) 0.1 % Apply 1 application topically 2 (two) times daily. 30 g 0  . VENTOLIN HFA 108 (90 Base) MCG/ACT inhaler      No current facility-administered medications for this encounter.     Allergies  Allergen Reactions  . Epinephrine Palpitations  . Meloxicam Other (See Comments)    Severe reflux  . Codeine Nausea And Vomiting    migraine  . Dextromethorphan  seizures  . Diphenhydramine Other (See Comments)    seizure  . Doxycycline Itching and Swelling    Facial  . Hydrocodone Nausea And Vomiting    MIGRAINE  . Hydromorphone Nausea And Vomiting    migraine  . Oxycodone Nausea And Vomiting    Severe migraine  . Propofol Other (See Comments)    Social History   Socioeconomic History  . Marital status: Widowed    Spouse name: Deceased  . Number of children: 2  . Years of education: 93  . Highest education level: Not on file  Occupational History  . Occupation: Retired  Scientific laboratory technician  . Financial resource strain: Not hard at all  . Food insecurity    Worry: Never true    Inability: Never true  . Transportation needs    Medical: No    Non-medical: No  Tobacco Use  . Smoking status: Never Smoker  . Smokeless tobacco: Never Used  Substance and Sexual Activity  . Alcohol use: No    Alcohol/week: 3.0 standard drinks    Types: 3 Standard drinks or equivalent per week  . Drug use: No  . Sexual activity: Never  Lifestyle  . Physical activity    Days per week: Not on file    Minutes per session: Not  on file  . Stress: Not on file  Relationships  . Social Herbalist on phone: Not on file    Gets together: Not on file    Attends religious service: Not on file    Active member of club or organization: Not on file    Attends meetings of clubs or organizations: Not on file    Relationship status: Not on file  . Intimate partner violence    Fear of current or ex partner: Not on file    Emotionally abused: Not on file    Physically abused: Not on file    Forced sexual activity: Not on file  Other Topics Concern  . Not on file  Social History Narrative   She is a widow.  Husband died from metastatic renal cell cancer. Originally from [place]. Previously lived in [place] from 1975-[place]. Moved to [place] in 2007. No mold exposure recently but did have it through a prior work exposure in 1993. Has a masters in public health. No bird exposure.      [facility] (09/29/17):   She has moved into a retirement community since last appointment. She reports she had testing at her new residence that was positive for mold. It has since been treated.     Family History  Problem Relation Age of Onset  . Hypertension Mother   . Arrhythmia Mother   . Heart failure Mother   . Arrhythmia Brother   . Stroke Brother 11       cerebral hemorrhage, nonsmoker, no HTN  . Prostate cancer Brother   . Stroke Father        from an aneurysm  . Stroke Maternal Aunt 83       cerebral hemorrhage  . Liver cancer Maternal Grandmother   . Atrial fibrillation Son   . Celiac disease Son   . Heart attack Neg Hx   . Breast cancer Neg Hx     ROS- All systems are reviewed and negative except as per the HPI above  Physical Exam: Vitals:   10/27/19 1418  Pulse: 79  Weight: 65 kg  Height: 5\' 5"  (1.651 m)   Wt Readings from Last  3 Encounters:  10/27/19 65 kg  09/27/19 62.6 kg  09/20/19 62.6 kg    Labs: Lab Results  Component Value Date   NA 138 05/21/2019   K 4.3 05/21/2019   CL 103  05/21/2019   CO2 27 05/21/2019   GLUCOSE 80 05/21/2019   BUN 24 (H) 05/21/2019   CREATININE 0.92 05/21/2019   CALCIUM 9.1 05/21/2019   MG 2.2 05/21/2019   Lab Results  Component Value Date   INR 1.11 05/06/2017   Lab Results  Component Value Date   CHOL 143 05/21/2019   HDL 59.50 05/21/2019   LDLCALC 71 05/21/2019   TRIG 60.0 05/21/2019     GEN- The patient is well appearing, alert and oriented x 3 today.   Head- normocephalic, atraumatic Eyes-  Sclera clear, conjunctiva pink Ears- hearing intact Oropharynx- clear Neck- supple, no JVP Lymph- no cervical lymphadenopathy Lungs- Clear to ausculation bilaterally, normal work of breathing Heart- Regular rate and rhythm, no murmurs, rubs or gallops, PMI not laterally displaced GI- soft, NT, ND, + BS Extremities- no clubbing, cyanosis, or edema MS- no significant deformity or atrophy Skin- no rash or lesion Psych- euthymic mood, full affect Neuro- strength and sensation are intact  EKG- AV dual paced, at 79 bpm, pr int 178 ms, qrs int 184 ms, qtc 545 ms    Assessment and Plan: 1. S/p  severe mitral regurgitation and atrial fibrillation status post minimally invasive bioprosthetic mitral valve replacement and Maze procedure, clipping of LAA in 2016 Has been enjoying SR since the procedure  Continue BB without change Continue eliquis 5 mg bid for a CHA2DS2VASc for at least 4  2. PPM Per Dr. Lovena Le  F/u in one year  Geroge Baseman. Aviannah Castoro, [facility] [address] [place], [place] [address] [phone]

## 2019-11-22 ENCOUNTER — Other Ambulatory Visit: Payer: Self-pay | Admitting: Internal Medicine

## 2019-11-22 ENCOUNTER — Other Ambulatory Visit: Payer: Self-pay | Admitting: Cardiovascular Disease

## 2019-11-22 NOTE — Telephone Encounter (Signed)
*  STAT* If patient is at the pharmacy, call can be transferred to refill team.   1. Which medications need to be refilled? (please list name of each medication and dose if known) ELIQUIS 5 MG TABS tablet  2. Which pharmacy/location (including street and city if local pharmacy) is medication to be sent to? [place], [place]  3. Do they need a 30 day or 90 day supply? 90 day   Patient only has enough medication to last through 11/24/19.

## 2019-11-22 NOTE — Telephone Encounter (Signed)
Pt last saw Roderic Palau, NP on 10/27/19, last labs 05/21/19 Creat 0.92, age 78, weight 65kg, based on specified criteria pt is on appropriate dosage of Eliquis 5mg  BID.  Will refill rx.

## 2019-11-22 NOTE — Telephone Encounter (Signed)
RX refill already sent to pharmacy this am, receipt confirmed by pharmacy.

## 2019-11-23 ENCOUNTER — Telehealth: Payer: Self-pay

## 2019-11-23 NOTE — Telephone Encounter (Signed)
Pt called in stating that she usually gets a year supply on her Eliquis and wants to know why she only got 6 months supply? Pt would like a call back at [phone]

## 2019-11-25 NOTE — Telephone Encounter (Signed)
We only refill Eliquis for 6 months at a time so that we can confirm pt is on appropriate dosage based on labwork, age and weight.  Pt will be due for repeat labwork in June 2021. Returned call to pt, made her aware of above.

## 2019-11-29 ENCOUNTER — Ambulatory Visit: Payer: Medicare Other | Attending: Internal Medicine

## 2019-11-29 DIAGNOSIS — Z20822 Contact with and (suspected) exposure to covid-19: Secondary | ICD-10-CM

## 2019-11-30 ENCOUNTER — Ambulatory Visit (INDEPENDENT_AMBULATORY_CARE_PROVIDER_SITE_OTHER): Payer: Medicare Other | Admitting: *Deleted

## 2019-11-30 DIAGNOSIS — I495 Sick sinus syndrome: Secondary | ICD-10-CM | POA: Diagnosis not present

## 2019-11-30 LAB — CUP PACEART REMOTE DEVICE CHECK
Battery Impedance: 306 Ohm
Battery Remaining Longevity: 91 mo
Battery Voltage: 2.79 V
Brady Statistic AP VP Percent: 94 %
Brady Statistic AP VS Percent: 0 %
Brady Statistic AS VP Percent: 5 %
Brady Statistic AS VS Percent: 0 %
Date Time Interrogation Session: 20201229104650
Implantable Lead Implant Date: 20161205
Implantable Lead Implant Date: 20170321
Implantable Lead Location: 753859
Implantable Lead Location: 753860
Implantable Lead Model: 5076
Implantable Lead Model: 5076
Implantable Pulse Generator Implant Date: 20161205
Lead Channel Impedance Value: 446 Ohm
Lead Channel Impedance Value: 734 Ohm
Lead Channel Pacing Threshold Amplitude: 0.5 V
Lead Channel Pacing Threshold Amplitude: 0.5 V
Lead Channel Pacing Threshold Pulse Width: 0.4 ms
Lead Channel Pacing Threshold Pulse Width: 0.4 ms
Lead Channel Setting Pacing Amplitude: 2 V
Lead Channel Setting Pacing Amplitude: 2.5 V
Lead Channel Setting Pacing Pulse Width: 0.4 ms
Lead Channel Setting Sensing Sensitivity: 4 mV

## 2019-12-01 LAB — NOVEL CORONAVIRUS, NAA: SARS-CoV-2, NAA: NOT DETECTED

## 2019-12-21 ENCOUNTER — Other Ambulatory Visit: Payer: Self-pay

## 2019-12-21 ENCOUNTER — Ambulatory Visit (INDEPENDENT_AMBULATORY_CARE_PROVIDER_SITE_OTHER): Payer: Medicare Other | Admitting: Cardiovascular Disease

## 2019-12-21 ENCOUNTER — Encounter: Payer: Self-pay | Admitting: Cardiovascular Disease

## 2019-12-21 VITALS — BP 108/68 | HR 87 | Ht 65.0 in | Wt 142.0 lb

## 2019-12-21 DIAGNOSIS — I4819 Other persistent atrial fibrillation: Secondary | ICD-10-CM | POA: Diagnosis not present

## 2019-12-21 DIAGNOSIS — E782 Mixed hyperlipidemia: Secondary | ICD-10-CM

## 2019-12-21 DIAGNOSIS — I341 Nonrheumatic mitral (valve) prolapse: Secondary | ICD-10-CM

## 2019-12-21 NOTE — Patient Instructions (Signed)
Medication Instructions:  Your physician recommends that you continue on your current medications as directed. Please refer to the Current Medication list given to you today.  *If you need a refill on your cardiac medications before your next appointment, please call your pharmacy*   Lab Work: None Ordered If you have labs (blood work) drawn today and your tests are completely normal, you will receive your results only by: MyChart Message (if you have MyChart) OR A paper copy in the mail If you have any lab test that is abnormal or we need to change your treatment, we will call you to review the results.   Testing/Procedures: None Ordered   Follow-Up: At [facility], you and your health needs are our priority.  As part of our continuing mission to provide you with exceptional heart care, we have created designated Provider Care Teams.  These Care Teams include your primary Cardiologist (physician) and Advanced Practice Providers (APPs -  Physician Assistants and Nurse Practitioners) who all work together to provide you with the care you need, when you need it.    Your next appointment:   1 year(s)  The format for your next appointment:   In Person  Provider:   Scott Weaver, PA-C   

## 2019-12-21 NOTE — Progress Notes (Signed)
[name] Date of Birth  [birth year]       [facility] Office [address] [place], [place], [place]  [address]   [place], [place]  [address] [phone]     [phone]   Fax  [phone]    Fax [phone]  Problem List: 1. Supraventricular tachycardia 2. Mitral valve prolapse - s/p mitral valve replacement  3. Cataracts 4.     [name] [name] is a  79 yo with hx of SVT and MVP.   She has done well.  She has not been exercising as much as she would like.  She has not had any syncope or presyncope.  She has been taking care of her granddaughter who has ADD and a sleep disorder.  [name] did not get much sleep that weekend and had lots of palpitations for the following week.  Oct. 7, 2014  [name] is seen after a 1 1/2 year absence.  She has had some fatigue.   Dr. Derrel Nip has found thrombocytopenia.  Crestor was stopped but this did not help the thrombocytopenia.   She has seen a hemotologist.    She fell  (caught her toe at a [employer] - not syncope) several months ago and had reconstruction of her jaw.  She also broke her left wrist.     Oct. 2, 2015 :  [name] is doing well from a cardiac standpoint.  Her cholesterol levels are slightly elevated ( even on Crestor 5 mg a day).  She has had marginally low platelets .  Her cholesterol levels are still minimally elevated.    She had some dyspnea this summer.  Currently , she has no limitations.  She thinks the symptoms may have been due to allergies.  No chest pain.      She thinks that her stamina is not as good as it should be.  She does yoga but no cardio workouts.    Sept 6, 2016:  [name] is seen today for a recent episode of paroxysmal atrial fib August 21 was shopping , felt very weak .   Could not walk to her car.  Very short of breath .  She went to [place] a day or so later.  Gradually had worsening chest tightness, allergy, weakness.  Thought it was allergies.    Had significant leg swelling .   She went to the hospital. She was found have rapid atrial fibrillation with evidence of congestive heart failure. Her echocardiogram showed normal left ventricle systolic function. She has mild mitral regurgitation. Was significant calcification of the mitral valve annulus. She was started on Lovenox. She's not received Coumadin at this point.  She was started on diltiazem and Lasix.  Was started on Lovenox ( because of the atrial fib in the setting of MR and itral valve calcification .   She seems to be doing quite a lot at her. She's been going to doctors appointments and has been walking across the parking lot  without any CP with problems.  Sept. 13, 2016:  [name] is seen back for a 1 week visit. She was found to have rapid atrial fib last week Had some dyspnea last night - better with a Xopenex sample inhaler.  Feels better on the Eliquis and Metoprolol .  Feels more relaxed.  Cannot tell if she is in atrial fib Has low energy .  Difficult to walk to the mailbox without extreme fatigue ,  not specifically short of breath   Oct. 11, 2016:  Was cardioverted several weeks ago.  Feeling better every day since that   Nov. 10, 2016:  Has done well.  Steady improvement since  the cardioversion.  Then for the past several days/ weeks has not been doing quite a good Has shortness of breath with showering and doing everyday activities.    Feb. 10, 2017:  Is doing well - has had Mitral valve replacement and MAZE procedure since I last saw her  Just this past week, she felt much better.   Has been fatigued but now has more energetic . Has gain  Apr 16, 2016:  Edner has had an atrial pacer lead revision since I last saw her. Feels "a bit off since last Thursday " Was bending over to prune some bushes.  Has not felt right since that time  Goes to the gym Geophysicist/field seismologist)  Feels "odd" when she bends over.  Has been anxious recently   Sept. 18,  2017:  Valari is seen today for follow up visit Doing well. No cardiac complaints.   Feb. 27, 2018:  Kursten presents today for evaluation of leg weakness. She has trouble walking from her car to the cardiac rehab but then is able to exercise on the treadmill for 30 minutes without any problems .   Not in atrial fib  Does not take the lasix on a regular basis   May 06, 2017:  Xianna is seen back for follow up visit  She is doing much better.   She was having lots of DOE Dr. Derrel Nip found that she was anemic and prescribed iron.  Since starting iron, she has been feeling much better.  Is planning on moving  into [employer] .    This past Saturday , had what may have been a TIA in her left hand.   Had numbness - lasted 4 minutes.  Felt "dead" but she never lost use of her hand. She was able to shake her hand and work the "deadness" out  Has not missed any doses of Eliquis.  November 04, 2017:  Hendy is seen today for follow-up visit.  She is doing well following her mitral valve replacement.  She has a history of atrial fibrillation and is status post Maze procedure.  She saw atrial fib clinic on November 15 and she has done well.  Her pacemaker interrogation reveals less than 1% atrial fibrillation burden.  Has moved into [facility].  Is having some allergy to dust and molds.  Is having lots more allery issues - has restarted Sinular,   Having some back issues. Was bending over to unload a box,   Felt a pull in her back and immediately had numbness in her left hand and foot.     December 21, 2019:  Jakenya seen back today after 2-year absence. 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  Develops numbness of her hand.   Lasts between 4 - 7 min. Is on meds.  Had been seizure free for many months but recently had a breakthrough.   Doing well from a cardiac standpoint.  No cp or dyspnea.   Has some generalized weakness which is likely due to generalized deconditioning .  Has restarted working out at [facility] rehab .      Current Outpatient Medications on File Prior to Visit  Medication Sig Dispense Refill  . amoxicillin (AMOXIL) 500 MG tablet Take 4 tablets 60 minutes prior to dental procedures 30 tablet 6  . Ascorbic Acid (VITAMIN C) 1000 MG tablet Take 11 tablets by mouth daily.    Marland Kitchen azelastine (ASTELIN) 0.1 % nasal spray Place 2 sprays into both nostrils 2 (two) times daily. Use in each nostril as directed 30 mL 12  . b complex vitamins tablet Take 1 tablet by mouth daily.    . butalbital-acetaminophen-caffeine (FIORICET) 50-325-40 MG tablet TAKE 1 TABLET BY MOUTH AS NEEDED FOR BACK PAIN OR MIGRAINE HEADACHE 90 tablet 5  . Cholecalciferol (VITAMIN D) 2000 units CAPS Take 2,000 Units by mouth daily.    Marland Kitchen ELIQUIS 5 MG TABS tablet TAKE ONE TABLET TWICE DAILY 180 tablet 1  . estradiol (ESTRACE VAGINAL) 0.1 MG/GM vaginal cream PLACE 1 APPLICATORFUL VAGINALLY  2 NIGHT PER WEEK 42.5 g 5  . fluticasone (FLONASE) 50 MCG/ACT nasal spray Place 2 sprays into both nostrils daily. 16 g 11  . folic acid (FOLVITE) A999333 MCG tablet     . furosemide (LASIX) 40 MG tablet Take 1 tablet (40 mg total) by mouth as directed. 30 tablet 11  . lamoTRIgine (LAMICTAL) 100 MG tablet Take 100 mg by mouth 2 (two) times daily.     . metoprolol tartrate (LOPRESSOR) 25 MG tablet TAKE 1/2 TABLET BY MOUTH TWICE DAILY 90 tablet 3  . montelukast (SINGULAIR) 10 MG tablet Take 1 tablet (10 mg total) by mouth at bedtime. 90 tablet 1  . polyethylene glycol powder (MIRALAX) 17 GM/SCOOP powder Take 1 Dose by mouth daily.    . rosuvastatin (CRESTOR) 40 MG tablet TAKE ONE TABLET EVERY DAY 90 tablet 1  . triamcinolone cream (KENALOG) 0.1 % Apply 1 application topically 2 (two) times daily. 30 g 0  . VENTOLIN HFA 108 (90 Base) MCG/ACT inhaler      No current facility-administered medications on file prior to visit.     Allergies  Allergen Reactions  . Epinephrine Palpitations  . Meloxicam Other (See Comments)    Severe reflux  . Codeine Nausea And Vomiting    migraine  . Dextromethorphan     seizures  . Diphenhydramine Other (See Comments)    seizure  . Doxycycline Itching and Swelling    Facial  . Hydrocodone Nausea And Vomiting    MIGRAINE  . Hydromorphone Nausea And Vomiting    migraine  . Oxycodone Nausea And Vomiting    Severe migraine  . Propofol Other (See Comments)    Past Medical History:  Diagnosis Date  . Acute on chronic diastolic (congestive) heart failure ([place])   . Allergy    See list  . Anemia 2018  . Anxiety    Occasionally take Xanax for sleep  . Anxiety associated with depression    Prn alprazolam   . Anxiety associated with depression   . Arthritis    Hands, Back  . Atrial fibrillation, persistent ([place])    DCCV 08/22/2015  . Bradycardia post-op bradycardia, pacer dependent   MDT PPM 11/06/15, Dr. Lovena Le  . Cataract    Left eye  . Diverticulosis   . Focal seizures ([place])   . Heart murmur   . Hemorrhoid   . Hepatic cyst    innumerable  . 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 Hyperlipidemia   . IBS (irritable bowel syndrome)   . 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 Migraine   . MVP (mitral valve prolapse)   .  Nodule of right lung   . Osteoporosis of forearm   . RBBB   . Restrictive lung disease    Mild on PFT & likely cardiac in etiology   . S/P Minimally invasive maze operation for atrial fibrillation 10/31/2015   Complete bilateral atrial lesion set using cryothermy and bipolar radiofrequency ablation with clipping of LA appendage via right mini thoracotomy approach  . 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 Severe mitral regurgitation   . 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 SVT (supraventricular tachycardia) ([place])   . Thoracic aorta atherosclerosis ([place])   . TIA (transient ischemic attack)     Past Surgical History:  Procedure Laterality Date  . BREAST EXCISIONAL BIOPSY Right Late 80s   Negative X2  . CARDIAC CATHETERIZATION N/A 10/18/2015   Procedure: Right/Left Heart Cath and Coronary Angiography;  Surgeon: Sherren Mocha, MD;  Location: [place];  Service: Cardiovascular;  Laterality: N/A;  . CARDIOVERSION N/A 08/22/2015   Procedure: CARDIOVERSION;  Surgeon: Thayer Headings, MD;  Location: [place];  Service: Cardiovascular;  Laterality: N/A;  . COLONOSCOPY  2003  . EP IMPLANTABLE DEVICE N/A 11/06/2015   Procedure: Pacemaker Implant;  Surgeon: Evans Lance, MD;  Location: [place];  Service: Cardiovascular;  Laterality: N/A;  . EP IMPLANTABLE DEVICE N/A 02/20/2016   Procedure: Lead Extraction;  Surgeon: Evans Lance, MD;  Location: [facility];  Service: Cardiovascular;  Laterality: N/A;  . EYE SURGERY Right 2013  . FOOT SURGERY    . MANDIBLE FRACTURE SURGERY  03-26-13  . MINIMALLY INVASIVE MAZE PROCEDURE N/A 10/31/2015   Procedure: MINIMALLY INVASIVE MAZE PROCEDURE;  Surgeon: Rexene Alberts, MD;  Location: [facility];  Service: Open Heart Surgery;  Laterality: N/A;  . MITRAL VALVE REPLACEMENT Right 10/31/2015   Procedure: MINIMALLY INVASIVE MITRAL VALVE (MV) REPLACEMENT;  Surgeon: Rexene Alberts, MD;  Location: [place];  Service: Open Heart Surgery;  Laterality: Right;  . PACEMAKER LEAD REMOVAL  02/20/2016  . TEE WITH CARDIOVERSION    . TEE WITHOUT CARDIOVERSION N/A 08/22/2015   Procedure: TRANSESOPHAGEAL ECHOCARDIOGRAM (TEE);  Surgeon: Thayer Headings, MD;  Location: [facility];  Service: Cardiovascular;  Laterality: N/A;  . TEE WITHOUT CARDIOVERSION N/A 10/31/2015   Procedure: TRANSESOPHAGEAL ECHOCARDIOGRAM (TEE);  Surgeon: Rexene Alberts, MD;  Location: [facility];  Service: Open Heart Surgery;  Laterality: N/A;  .  TUBAL LIGATION    . VARICOSE VEIN SURGERY Right     Social History   Tobacco Use  Smoking Status Never Smoker  Smokeless Tobacco Never Used    Social History   Substance and Sexual Activity  Alcohol Use No  . Alcohol/week: 3.0 standard drinks  . Types: 3 Standard drinks or equivalent per week    Family History  Problem Relation Age of Onset  . Hypertension Mother   . Arrhythmia Mother   . Heart failure Mother   . Arrhythmia Brother   . Stroke Brother 81       cerebral hemorrhage, nonsmoker, no HTN  . Prostate cancer Brother   . Stroke Father        from an aneurysm  . Stroke Maternal Aunt 83       cerebral hemorrhage  . Liver cancer Maternal Grandmother   . Atrial fibrillation Son   .  Celiac disease Son   . Heart attack Neg Hx   . Breast cancer Neg Hx     Reviw of Systems:  Reviewed in the HPI.  All other systems are negative.   Physical Exam: Blood pressure 108/68, pulse 87, height 5\' 5"  (1.651 m), weight 142 lb (64.4 kg), SpO2 97 %.  GEN:  Elderly female  HEENT: Normal NECK: No JVD; No carotid bruits LYMPHATICS: No lymphadenopathy CARDIAC: RRR  Soft mid systolic click.  No murmur  RESPIRATORY:  Clear to auscultation without rales, wheezing or rhonchi  ABDOMEN: Soft, non-tender, non-distended MUSCULOSKELETAL:  No edema; No deformity  SKIN: Warm and dry NEUROLOGIC:  Alert and oriented x 3   ECG:     Assessment / Plan:   1.   focal aware seizures  - has been diagnoses with  Focal aware seizures.   Is on medication.  Will be following up with neurology .   2. Dyspnea with exertion:  Improving    3. Persistent Atrial fibrillation with rapid ventricular response: s/p MAZE procedure , no recent arrhythmias  4.  MVP with MR: , overall is doing well    5. Congestive heart failure:    6. Pacemaker:   Per EP   7. Hyperlipidemia:  Further management per Dr. Derrel Nip  LDL is 71  Cont meds.     Mertie Moores, MD  12/21/2019 10:03 AM    [address],  [place], [place]  [address] Pager [phone] Phone: [phone]; Fax: [phone]

## 2020-01-10 DIAGNOSIS — R569 Unspecified convulsions: Secondary | ICD-10-CM | POA: Insufficient documentation

## 2020-02-04 ENCOUNTER — Other Ambulatory Visit: Payer: Self-pay

## 2020-02-08 ENCOUNTER — Other Ambulatory Visit: Payer: Self-pay

## 2020-02-08 ENCOUNTER — Ambulatory Visit (INDEPENDENT_AMBULATORY_CARE_PROVIDER_SITE_OTHER): Payer: Medicare Other | Admitting: Internal Medicine

## 2020-02-08 ENCOUNTER — Encounter: Payer: Self-pay | Admitting: Internal Medicine

## 2020-02-08 VITALS — BP 112/64 | HR 80 | Temp 96.8°F | Resp 15 | Ht 65.0 in | Wt 142.0 lb

## 2020-02-08 DIAGNOSIS — R799 Abnormal finding of blood chemistry, unspecified: Secondary | ICD-10-CM | POA: Diagnosis not present

## 2020-02-08 DIAGNOSIS — K4090 Unilateral inguinal hernia, without obstruction or gangrene, not specified as recurrent: Secondary | ICD-10-CM

## 2020-02-08 DIAGNOSIS — R569 Unspecified convulsions: Secondary | ICD-10-CM

## 2020-02-08 DIAGNOSIS — D696 Thrombocytopenia, unspecified: Secondary | ICD-10-CM

## 2020-02-08 DIAGNOSIS — R7303 Prediabetes: Secondary | ICD-10-CM

## 2020-02-08 NOTE — Progress Notes (Signed)
Subjective:  Patient ID: [name] [name], female    DOB: 11/09/1941  Age: 79 y.o. MRN: [id number]  CC: The primary encounter diagnosis was Thrombocytopenia ([place]). Diagnoses of Elevated BUN, Inguinal hernia of right side without obstruction or gangrene, Focal seizures ([place]), and Prediabetes were also pertinent to this visit.  HPI [name] [name] presents for follow up on multiple conditions  `1) She has had left sided hip pain (until yesterday) for the past 3 months aggravated by sitting or standing.  She does not feel that the pain involves the joint because she has tested her ROM has no pain with manipulation.  Felt like a pulled muscle, but she had not done yoga in months due to COVID 19 restrictions on classes.  In fact she has been inactive for months and is dismayed by the 4 lb weight gain  She has had. The pain has spontaneously resolved in the last 24 hours   2)  right inguinal hernia . Feels like it has been 'twinging " more frequently,  No bulge, no discoloration.  Discussed referral to [place] once he is at [place].   3) Memory deficits (word finding difficulty) for one year post heart surgery  Have finally resolved   4) Seizure disorder:  Has been seizure free on current regimen managed by Manuella Ghazi and with diligent avoidance of  over activity resulting in exhaustion and fasting   5)elevated BUN on recent labs done by Manuella Ghazi . Reviewed last 2 years of labs.  BUN has been 1 pt above normal,  Now 3 pts above normal  Drinks decaffeinated tea and water  . Had not used furosemide prior to the lab done at [facility]     Outpatient Medications Prior to Visit  Medication Sig Dispense Refill  . amoxicillin (AMOXIL) 500 MG tablet Take 4 tablets 60 minutes prior to dental procedures 30 tablet 6  . Ascorbic Acid (VITAMIN C) 1000 MG tablet Take 11 tablets by mouth daily.    Marland Kitchen azelastine (ASTELIN) 0.1 % nasal spray Place 2 sprays into both nostrils 2 (two) times daily. Use in each nostril as directed  30 mL 12  . b complex vitamins tablet Take 1 tablet by mouth daily.    . butalbital-acetaminophen-caffeine (FIORICET) 50-325-40 MG tablet TAKE 1 TABLET BY MOUTH AS NEEDED FOR BACK PAIN OR MIGRAINE HEADACHE 90 tablet 5  . Cholecalciferol (VITAMIN D) 2000 units CAPS Take 2,000 Units by mouth daily.    Marland Kitchen ELIQUIS 5 MG TABS tablet TAKE ONE TABLET TWICE DAILY 180 tablet 1  . estradiol (ESTRACE VAGINAL) 0.1 MG/GM vaginal cream PLACE 1 APPLICATORFUL VAGINALLY  2 NIGHT PER WEEK 42.5 g 5  . fluticasone (FLONASE) 50 MCG/ACT nasal spray Place 2 sprays into both nostrils daily. 16 g 11  . folic acid (FOLVITE) A999333 MCG tablet     . furosemide (LASIX) 40 MG tablet Take 1 tablet (40 mg total) by mouth as directed. 30 tablet 11  . lamoTRIgine (LAMICTAL) 150 MG tablet Take 150 mg by mouth 2 (two) times daily.    . metoprolol tartrate (LOPRESSOR) 25 MG tablet TAKE 1/2 TABLET BY MOUTH TWICE DAILY 90 tablet 3  . montelukast (SINGULAIR) 10 MG tablet Take 1 tablet (10 mg total) by mouth at bedtime. 90 tablet 1  . polyethylene glycol powder (MIRALAX) 17 GM/SCOOP powder Take 1 Dose by mouth daily.    . rosuvastatin (CRESTOR) 40 MG tablet TAKE ONE TABLET EVERY DAY 90 tablet 1  . triamcinolone cream (KENALOG) 0.1 %  Apply 1 application topically 2 (two) times daily. 30 g 0  . VENTOLIN HFA 108 (90 Base) MCG/ACT inhaler     . lamoTRIgine (LAMICTAL) 100 MG tablet Take 100 mg by mouth 2 (two) times daily.      No facility-administered medications prior to visit.    Review of Systems;  Patient denies headache, fevers, malaise, unintentional weight loss, skin rash, eye pain, sinus congestion and sinus pain, sore throat, dysphagia,  hemoptysis , cough, dyspnea, wheezing, chest pain, palpitations, orthopnea, edema, abdominal pain, nausea, melena, diarrhea, constipation, flank pain, dysuria, hematuria, urinary  Frequency, nocturia, numbness, tingling, seizures,  Focal weakness, Loss of consciousness,  Tremor, insomnia, depression,  anxiety, and suicidal ideation.      Objective:  BP 112/64 (BP Location: Left Arm, Patient Position: Sitting, Cuff Size: Normal)   Pulse 80   Temp (!) 96.8 F (36 C) (Temporal)   Resp 15   Ht 5\' 5"  (1.651 m)   Wt 142 lb (64.4 kg)   SpO2 99%   BMI 23.63 kg/m   BP Readings from Last 3 Encounters:  02/08/20 112/64  12/21/19 108/68  10/27/19 118/74    Wt Readings from Last 3 Encounters:  02/08/20 142 lb (64.4 kg)  12/21/19 142 lb (64.4 kg)  10/27/19 143 lb 6.4 oz (65 kg)    General appearance: alert, cooperative and appears stated age Ears: normal TM's and external ear canals both ears Throat: lips, mucosa, and tongue normal; teeth and gums normal Neck: no adenopathy, no carotid bruit, supple, symmetrical, trachea midline and thyroid not enlarged, symmetric, no tenderness/mass/nodules Back: symmetric, no curvature. ROM normal. No CVA tenderness. Lungs: clear to auscultation bilaterally Heart: regular rate and rhythm, S1, S2 normal, no murmur, click, rub or gallop Abdomen: soft, non-tender; bowel sounds normal; right inguinal area with fullness,  No discrete hernia ,  no organomegaly  Pulses: 2+ and symmetric Skin: Skin color, texture, turgor normal. No rashes or lesions Lymph nodes: Cervical, supraclavicular, and axillary nodes normal.  Lab Results  Component Value Date   HGBA1C 5.7 05/21/2019   HGBA1C 5.3 04/07/2018   HGBA1C 5.3 08/26/2017    Lab Results  Component Value Date   CREATININE 0.92 05/21/2019   CREATININE 0.81 11/13/2018   CREATININE 0.80 09/28/2018    Lab Results  Component Value Date   WBC 5.3 08/23/2019   HGB 12.1 08/23/2019   HCT 35.9 (L) 08/23/2019   PLT 112.0 (L) 08/23/2019   GLUCOSE 80 05/21/2019   CHOL 143 05/21/2019   TRIG 60.0 05/21/2019   HDL 59.50 05/21/2019   LDLDIRECT 113.0 02/15/2016   LDLCALC 71 05/21/2019   ALT 15 05/21/2019   AST 21 05/21/2019   NA 138 05/21/2019   K 4.3 05/21/2019   CL 103 05/21/2019   CREATININE 0.92  05/21/2019   BUN 24 (H) 05/21/2019   CO2 27 05/21/2019   TSH 4.24 05/21/2019   INR 1.11 05/06/2017   HGBA1C 5.7 05/21/2019    No results found.  Assessment & Plan:   Problem List Items Addressed This Visit      Unprioritized   Thrombocytopenia ([place]) - Primary    Stable , unaffected by statin suspension .statin resumed   6 month follow up cbc ordered       Relevant Orders   CBC with Differential/Platelet   Prediabetes    Rechecking a1c given her lack of exercise and weight gain      Relevant Orders   Hemoglobin A1c   Focal seizures ([place])  Her seizures begin with numbness of hand and foot .  Tolerating lamictal without recurrence unless provoked by sleep deprivation.       Relevant Medications   lamoTRIgine (LAMICTAL) 150 MG tablet   Inguinal hernia of right side without obstruction or gangrene    Early, nonreducible . Referral to [facility] for evaluation       Relevant Orders   Hemoglobin A1c    Other Visit Diagnoses    Elevated BUN       Relevant Orders   Renal function panel      I am having [name] [name] maintain her Vitamin D, b complex vitamins, folic acid, fluticasone, Ventolin HFA, butalbital-acetaminophen-caffeine, triamcinolone cream, furosemide, amoxicillin, metoprolol tartrate, vitamin C, polyethylene glycol powder, montelukast, estradiol, azelastine, rosuvastatin, Eliquis, and lamoTRIgine.  No orders of the defined types were placed in this encounter.   Medications Discontinued During This Encounter  Medication Reason  . lamoTRIgine (LAMICTAL) 100 MG tablet Change in therapy    Follow-up: No follow-ups on file.   [name], MD

## 2020-02-08 NOTE — Patient Instructions (Addendum)
RETURN WELL HYDRATED ON Friday FOR LABS   Referral to Dr Bary Castilla for evaluation of your right inguinal hernia.    You are not overweight!  You're just soft from lack of exercise

## 2020-02-09 DIAGNOSIS — K4090 Unilateral inguinal hernia, without obstruction or gangrene, not specified as recurrent: Secondary | ICD-10-CM | POA: Insufficient documentation

## 2020-02-09 NOTE — Assessment & Plan Note (Addendum)
Early, nonreducible . Referral to [facility] for evaluation

## 2020-02-09 NOTE — Assessment & Plan Note (Signed)
Stable , unaffected by statin suspension .statin resumed   6 month follow up cbc ordered

## 2020-02-09 NOTE — Assessment & Plan Note (Signed)
Rechecking a1c given her lack of exercise and weight gain

## 2020-02-09 NOTE — Assessment & Plan Note (Signed)
Her seizures begin with numbness of hand and foot .  Tolerating lamictal without recurrence unless provoked by sleep deprivation.

## 2020-02-11 ENCOUNTER — Other Ambulatory Visit: Payer: Self-pay

## 2020-02-11 ENCOUNTER — Other Ambulatory Visit (INDEPENDENT_AMBULATORY_CARE_PROVIDER_SITE_OTHER): Payer: Medicare Other

## 2020-02-11 DIAGNOSIS — R7303 Prediabetes: Secondary | ICD-10-CM | POA: Diagnosis not present

## 2020-02-11 DIAGNOSIS — R799 Abnormal finding of blood chemistry, unspecified: Secondary | ICD-10-CM

## 2020-02-11 DIAGNOSIS — D696 Thrombocytopenia, unspecified: Secondary | ICD-10-CM | POA: Diagnosis not present

## 2020-02-11 DIAGNOSIS — K4090 Unilateral inguinal hernia, without obstruction or gangrene, not specified as recurrent: Secondary | ICD-10-CM

## 2020-02-11 LAB — CBC WITH DIFFERENTIAL/PLATELET
Basophils Absolute: 0 10*3/uL (ref 0.0–0.1)
Basophils Relative: 0.9 % (ref 0.0–3.0)
Eosinophils Absolute: 0.1 10*3/uL (ref 0.0–0.7)
Eosinophils Relative: 3.2 % (ref 0.0–5.0)
HCT: 35.3 % — ABNORMAL LOW (ref 36.0–46.0)
Hemoglobin: 11.8 g/dL — ABNORMAL LOW (ref 12.0–15.0)
Lymphocytes Relative: 30 % (ref 12.0–46.0)
Lymphs Abs: 1.2 10*3/uL (ref 0.7–4.0)
MCHC: 33.6 g/dL (ref 30.0–36.0)
MCV: 92.9 fl (ref 78.0–100.0)
Monocytes Absolute: 0.3 10*3/uL (ref 0.1–1.0)
Monocytes Relative: 7.2 % (ref 3.0–12.0)
Neutro Abs: 2.4 10*3/uL (ref 1.4–7.7)
Neutrophils Relative %: 58.7 % (ref 43.0–77.0)
Platelets: 112 10*3/uL — ABNORMAL LOW (ref 150.0–400.0)
RBC: 3.8 Mil/uL — ABNORMAL LOW (ref 3.87–5.11)
RDW: 13.8 % (ref 11.5–15.5)
WBC: 4.1 10*3/uL (ref 4.0–10.5)

## 2020-02-11 LAB — HEMOGLOBIN A1C: Hgb A1c MFr Bld: 5.5 % (ref 4.6–6.5)

## 2020-02-11 LAB — RENAL FUNCTION PANEL
Albumin: 4.2 g/dL (ref 3.5–5.2)
BUN: 23 mg/dL (ref 6–23)
CO2: 29 mEq/L (ref 19–32)
Calcium: 9.1 mg/dL (ref 8.4–10.5)
Chloride: 105 mEq/L (ref 96–112)
Creatinine, Ser: 0.87 mg/dL (ref 0.40–1.20)
GFR: 62.76 mL/min (ref >60.00)
Glucose, Bld: 92 mg/dL (ref 70–99)
Phosphorus: 3.2 mg/dL (ref 2.3–4.6)
Potassium: 3.9 mEq/L (ref 3.5–5.1)
Sodium: 138 mEq/L (ref 135–145)

## 2020-02-16 ENCOUNTER — Other Ambulatory Visit: Payer: Self-pay | Admitting: Internal Medicine

## 2020-02-16 NOTE — Telephone Encounter (Signed)
Age 79, weight 64.4kg, SCr 0.87 on 02/11/20, afib indication, last OV Jan 2021

## 2020-02-23 ENCOUNTER — Other Ambulatory Visit: Payer: Self-pay | Admitting: Internal Medicine

## 2020-02-24 ENCOUNTER — Telehealth: Payer: Self-pay | Admitting: Internal Medicine

## 2020-02-24 ENCOUNTER — Other Ambulatory Visit: Payer: Self-pay

## 2020-02-24 NOTE — Telephone Encounter (Signed)
Medication list has been updated.

## 2020-02-24 NOTE — Telephone Encounter (Signed)
Pt called in and said her medication list is not showing a prescription for briviact 75mg  Twice a day prescribed by Gayland Curry, PA over at Dr. Raul Del office. She would like this updated.

## 2020-02-24 NOTE — Progress Notes (Signed)
Medication list has been updated per pt request.

## 2020-02-29 ENCOUNTER — Ambulatory Visit (INDEPENDENT_AMBULATORY_CARE_PROVIDER_SITE_OTHER): Payer: Medicare Other | Admitting: *Deleted

## 2020-02-29 DIAGNOSIS — Z950 Presence of cardiac pacemaker: Secondary | ICD-10-CM

## 2020-02-29 LAB — CUP PACEART REMOTE DEVICE CHECK
Battery Impedance: 354 Ohm
Battery Remaining Longevity: 87 mo
Battery Voltage: 2.79 V
Brady Statistic AP VP Percent: 95 %
Brady Statistic AP VS Percent: 0 %
Brady Statistic AS VP Percent: 5 %
Brady Statistic AS VS Percent: 0 %
Date Time Interrogation Session: 20210330082321
Implantable Lead Implant Date: 20161205
Implantable Lead Implant Date: 20170321
Implantable Lead Location: 753859
Implantable Lead Location: 753860
Implantable Lead Model: 5076
Implantable Lead Model: 5076
Implantable Pulse Generator Implant Date: 20161205
Lead Channel Impedance Value: 452 Ohm
Lead Channel Impedance Value: 734 Ohm
Lead Channel Pacing Threshold Amplitude: 0.5 V
Lead Channel Pacing Threshold Amplitude: 0.625 V
Lead Channel Pacing Threshold Pulse Width: 0.4 ms
Lead Channel Pacing Threshold Pulse Width: 0.4 ms
Lead Channel Setting Pacing Amplitude: 2 V
Lead Channel Setting Pacing Amplitude: 2.5 V
Lead Channel Setting Pacing Pulse Width: 0.4 ms
Lead Channel Setting Sensing Sensitivity: 4 mV

## 2020-02-29 NOTE — Progress Notes (Signed)
PPM Remote  

## 2020-03-13 ENCOUNTER — Telehealth: Payer: Self-pay | Admitting: Internal Medicine

## 2020-03-13 NOTE — Telephone Encounter (Signed)
Prolia verification in process AM[facility] Portal patient due 04/17/20 AMgen ID  [id number]

## 2020-03-28 ENCOUNTER — Other Ambulatory Visit: Payer: Self-pay | Admitting: Internal Medicine

## 2020-03-28 NOTE — Telephone Encounter (Signed)
Scheduled 04/18/20

## 2020-04-14 ENCOUNTER — Other Ambulatory Visit: Payer: Self-pay | Admitting: Internal Medicine

## 2020-04-18 ENCOUNTER — Other Ambulatory Visit (INDEPENDENT_AMBULATORY_CARE_PROVIDER_SITE_OTHER): Payer: Self-pay | Admitting: Vascular Surgery

## 2020-04-18 DIAGNOSIS — I672 Cerebral atherosclerosis: Secondary | ICD-10-CM

## 2020-04-19 ENCOUNTER — Other Ambulatory Visit: Payer: Self-pay

## 2020-04-19 ENCOUNTER — Ambulatory Visit (INDEPENDENT_AMBULATORY_CARE_PROVIDER_SITE_OTHER): Payer: Medicare Other

## 2020-04-19 DIAGNOSIS — M810 Age-related osteoporosis without current pathological fracture: Secondary | ICD-10-CM | POA: Diagnosis not present

## 2020-04-19 MED ORDER — DENOSUMAB 60 MG/ML ~~LOC~~ SOSY
60.0000 mg | PREFILLED_SYRINGE | Freq: Once | SUBCUTANEOUS | Status: AC
  Administered 2020-04-19: 60 mg via SUBCUTANEOUS

## 2020-04-19 NOTE — Progress Notes (Signed)
Patient presented for 6-month Prolia injection SQ to left arm. Patient tolerated well. 

## 2020-04-21 ENCOUNTER — Ambulatory Visit (INDEPENDENT_AMBULATORY_CARE_PROVIDER_SITE_OTHER): Payer: Medicare Other | Admitting: Vascular Surgery

## 2020-04-21 ENCOUNTER — Other Ambulatory Visit: Payer: Self-pay

## 2020-04-21 ENCOUNTER — Ambulatory Visit (INDEPENDENT_AMBULATORY_CARE_PROVIDER_SITE_OTHER): Payer: Medicare Other

## 2020-04-21 ENCOUNTER — Encounter (INDEPENDENT_AMBULATORY_CARE_PROVIDER_SITE_OTHER): Payer: Self-pay | Admitting: Vascular Surgery

## 2020-04-21 VITALS — BP 115/66 | HR 83 | Resp 16 | Ht 65.5 in | Wt 142.4 lb

## 2020-04-21 DIAGNOSIS — I672 Cerebral atherosclerosis: Secondary | ICD-10-CM

## 2020-04-21 DIAGNOSIS — R569 Unspecified convulsions: Secondary | ICD-10-CM | POA: Diagnosis not present

## 2020-04-21 DIAGNOSIS — I6523 Occlusion and stenosis of bilateral carotid arteries: Secondary | ICD-10-CM

## 2020-04-21 DIAGNOSIS — E782 Mixed hyperlipidemia: Secondary | ICD-10-CM

## 2020-04-21 DIAGNOSIS — I6529 Occlusion and stenosis of unspecified carotid artery: Secondary | ICD-10-CM | POA: Insufficient documentation

## 2020-04-21 DIAGNOSIS — I4819 Other persistent atrial fibrillation: Secondary | ICD-10-CM

## 2020-04-21 NOTE — Assessment & Plan Note (Signed)
Carotid duplex today shows stenosis in the lower end of the 40 to 59% range bilaterally with minimal progression from previous studies from about a year and a half ago. On appropriate medical therapy with Crestor and Eliquis.  No change in that for the time being.  Is on the Eliquis for atrial fibrillation, but this is certainly adequate for her carotid disease as well.  We will follow this on an annual basis.

## 2020-04-21 NOTE — Assessment & Plan Note (Signed)
New diagnosis since her last visit.  Follows with neurology.

## 2020-04-21 NOTE — Progress Notes (Signed)
MRN : [id number]  [name] [name] is a 79 y.o. (23-Dec-1940) female who presents with chief complaint of  Chief Complaint  Patient presents with  . Follow-up    ultrasound follow up  .  History of Present Illness: Patient returns today in follow up of her carotid disease.  Her previous diagnosis from her neurologist has been changed to a type of a seizure disorder.  This is causing her great anxiety.  The episodes of her left leg going numb have been infrequent however with her new medical regimen.  No speech or swallowing difficulty.  No visual symptoms.  Carotid duplex today shows stenosis in the lower end of the 40 to 59% range bilaterally with minimal progression from previous studies from about a year and a half ago.  Current Outpatient Medications  Medication Sig Dispense Refill  . amoxicillin (AMOXIL) 500 MG tablet Take 4 tablets 60 minutes prior to dental procedures 30 tablet 6  . Ascorbic Acid (VITAMIN C) 1000 MG tablet Take 11 tablets by mouth daily.    Marland Kitchen azelastine (ASTELIN) 0.1 % nasal spray Place 2 sprays into both nostrils 2 (two) times daily. Use in each nostril as directed 30 mL 12  . b complex vitamins tablet Take 1 tablet by mouth daily.    Marland Kitchen BRIVIACT 25 MG TABS tablet Take 25 mg by mouth 2 (two) times daily.    . butalbital-acetaminophen-caffeine (FIORICET) 50-325-40 MG tablet TAKE 1 TABLET BY MOUTH AS NEEDED FOR BACK PAIN OR MIGRAINE HEADACHE 90 tablet 5  . Cholecalciferol (VITAMIN D) 2000 units CAPS Take 2,000 Units by mouth daily.    Marland Kitchen ELIQUIS 5 MG TABS tablet TAKE ONE TABLET TWICE DAILY 180 tablet 1  . estradiol (ESTRACE VAGINAL) 0.1 MG/GM vaginal cream PLACE 1 APPLICATORFUL VAGINALLY  2 NIGHT PER WEEK 42.5 g 5  . fluticasone (FLONASE) 50 MCG/ACT nasal spray USE 2 SPRAYS IN BOTH NOSTRILS DAILY 16 g 11  . folic acid (FOLVITE) A999333 MCG tablet     . furosemide (LASIX) 40 MG tablet Take 1 tablet (40 mg total) by mouth as directed. 30 tablet 11  . hydroquinone 4 % cream      . lamoTRIgine (LAMICTAL) 150 MG tablet Take 150 mg by mouth 2 (two) times daily.    . metoprolol tartrate (LOPRESSOR) 25 MG tablet TAKE 1/2 TABLET BY MOUTH TWICE DAILY 90 tablet 3  . montelukast (SINGULAIR) 10 MG tablet TAKE ONE TABLET BY MOUTH AT BEDTIME 90 tablet 1  . polyethylene glycol powder (MIRALAX) 17 GM/SCOOP powder Take 1 Dose by mouth daily.    . rosuvastatin (CRESTOR) 40 MG tablet TAKE ONE TABLET EVERY DAY 90 tablet 1  . triamcinolone cream (KENALOG) 0.1 % Apply 1 application topically 2 (two) times daily. 30 g 0  . VENTOLIN HFA 108 (90 Base) MCG/ACT inhaler      No current facility-administered medications for this visit.    Past Medical History:  Diagnosis Date  . Acute on chronic diastolic (congestive) heart failure ([place])   . Allergy    See list  . Anemia 2018  . Anxiety    Occasionally take Xanax for sleep  . Anxiety associated with depression    Prn alprazolam   . Anxiety associated with depression   . Arthritis    Hands, Back  . Atrial fibrillation, persistent ([place])    DCCV 08/22/2015  . Bradycardia post-op bradycardia, pacer dependent   MDT PPM 11/06/15, Dr. Lovena Le  . Cataract  Left eye  . Diverticulosis   . Focal seizures ([place])   . Heart murmur   . Hemorrhoid   . Hepatic cyst    innumerable  . 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 Hyperlipidemia   . IBS (irritable bowel syndrome)   . 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 Migraine   . MVP (mitral valve prolapse)   . Nodule of right lung   . Osteoporosis of forearm   . RBBB   . Restrictive lung disease    Mild on PFT & likely cardiac in etiology   . S/P Minimally invasive maze operation for atrial fibrillation 10/31/2015   Complete bilateral atrial lesion set using cryothermy and bipolar radiofrequency ablation with clipping of LA appendage via right mini thoracotomy approach  . S/P minimally invasive mitral valve replacement with bioprosthetic valve 10/31/2015   33 mm [facility]  Mitral bovine bioprosthetic tissue valve placed via right mini thoracotomy approach  . Severe mitral regurgitation   . 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 SVT (supraventricular tachycardia) ([place])   . Thoracic aorta atherosclerosis ([place])   . TIA (transient ischemic attack)     Past Surgical History:  Procedure Laterality Date  . BREAST EXCISIONAL BIOPSY Right Late 80s   Negative X2  . CARDIAC CATHETERIZATION N/A 10/18/2015   Procedure: Right/Left Heart Cath and Coronary Angiography;  Surgeon: Sherren Mocha, MD;  Location: [place] [facility];  Service: Cardiovascular;  Laterality: N/A;  . CARDIOVERSION N/A 08/22/2015   Procedure: CARDIOVERSION;  Surgeon: Thayer Headings, MD;  Location: [facility];  Service: Cardiovascular;  Laterality: N/A;  . COLONOSCOPY  2003  . EP IMPLANTABLE DEVICE N/A 11/06/2015   Procedure: Pacemaker Implant;  Surgeon: Evans Lance, MD;  Location: [place] [facility];  Service: Cardiovascular;  Laterality: N/A;  . EP IMPLANTABLE DEVICE N/A 02/20/2016   Procedure: Lead Extraction;  Surgeon: Evans Lance, MD;  Location: [place] [facility];  Service: Cardiovascular;  Laterality: N/A;  . EYE SURGERY Right 2013  . FOOT SURGERY    . MANDIBLE FRACTURE SURGERY  03-26-13  . MINIMALLY INVASIVE MAZE PROCEDURE N/A 10/31/2015   Procedure: MINIMALLY INVASIVE MAZE PROCEDURE;  Surgeon: Rexene Alberts, MD;  Location: [facility];  Service: Open Heart Surgery;  Laterality: N/A;  . MITRAL VALVE REPLACEMENT Right 10/31/2015   Procedure: MINIMALLY INVASIVE MITRAL VALVE (MV) REPLACEMENT;  Surgeon: Rexene Alberts, MD;  Location: [place];  Service: Open Heart Surgery;  Laterality: Right;  . PACEMAKER LEAD REMOVAL  02/20/2016  . TEE WITH CARDIOVERSION    . TEE WITHOUT CARDIOVERSION N/A 08/22/2015   Procedure: TRANSESOPHAGEAL ECHOCARDIOGRAM (TEE);  Surgeon: Thayer Headings, MD;  Location: [place];  Service: Cardiovascular;  Laterality: N/A;  . TEE WITHOUT CARDIOVERSION  N/A 10/31/2015   Procedure: TRANSESOPHAGEAL ECHOCARDIOGRAM (TEE);  Surgeon: Rexene Alberts, MD;  Location: [facility];  Service: Open Heart Surgery;  Laterality: N/A;  . TUBAL LIGATION    . VARICOSE VEIN SURGERY Right      Social History   Tobacco Use  . Smoking status: Never Smoker  . Smokeless tobacco: Never Used  Substance Use Topics  . Alcohol use: No    Alcohol/week: 3.0 standard drinks    Types: 3 Standard drinks or equivalent per week  . Drug use: No      Family History  Problem Relation Age of Onset  . Hypertension Mother   . Arrhythmia Mother   .  Heart failure Mother   . Arrhythmia Brother   . Stroke Brother 32       cerebral hemorrhage, nonsmoker, no HTN  . Prostate cancer Brother   . Stroke Father        from an aneurysm  . Stroke Maternal Aunt 83       cerebral hemorrhage  . Liver cancer Maternal Grandmother   . Atrial fibrillation Son   . Celiac disease Son   . Heart attack Neg Hx   . Breast cancer Neg Hx     Allergies  Allergen Reactions  . Epinephrine Palpitations  . Meloxicam Other (See Comments)    Severe reflux  . Codeine Nausea And Vomiting    migraine  . Dextromethorphan     seizures  . Diphenhydramine Other (See Comments)    seizure  . Diphenhydramine Hcl     Other reaction(s): Other (See Comments)  . Doxycycline Itching and Swelling    Facial  . Hydrocodone Nausea And Vomiting    MIGRAINE  . Hydromorphone Nausea And Vomiting    migraine  . Oxycodone Nausea And Vomiting    Severe migraine  . Propofol Other (See Comments)     REVIEW OF SYSTEMS (Negative unless checked)  Constitutional: [] ?Weight loss  [] ?Fever  [] ?Chills Cardiac: [] ?Chest pain   [] ?Chest pressure   [] ?Palpitations   [] ?Shortness of breath when laying flat   [] ?Shortness of breath at rest   [] ?Shortness of breath with exertion. Vascular:  [] ?Pain in legs with walking   [] ?Pain in legs at rest   [] ?Pain in legs when laying flat   [] ?Claudication   [] ?Pain in feet  when walking  [] ?Pain in feet at rest  [] ?Pain in feet when laying flat   [] ?History of DVT   [] ?Phlebitis   [] ?Swelling in legs   [] ?Varicose veins   [] ?Non-healing ulcers Pulmonary:   [] ?Uses home oxygen   [] ?Productive cough   [] ?Hemoptysis   [] ?Wheeze  [] ?COPD   [] ?Asthma Neurologic:  [] ?Dizziness  [] ?Blackouts   [x] ?Seizures   [] ?History of stroke   [x] ?History of TIA  [] ?Aphasia   [] ?Temporary blindness   [] ?Dysphagia   [x] ?Weakness or numbness in arms   [x] ?Weakness or numbness in legs Musculoskeletal:  [x] ?Arthritis   [] ?Joint swelling   [] ?Joint pain   [] ?Low back pain Hematologic:  [] ?Easy bruising  [] ?Easy bleeding   [] ?Hypercoagulable state   [] ?Anemic  [] ?Hepatitis Gastrointestinal:  [] ?Blood in stool   [] ?Vomiting blood  [] ?Gastroesophageal reflux/heartburn   [] ?Abdominal pain Genitourinary:  [] ?Chronic kidney disease   [] ?Difficult urination  [] ?Frequent urination  [] ?Burning with urination   [] ?Hematuria Skin:  [] ?Rashes   [] ?Ulcers   [] ?Wounds Psychological:  [x] ?History of anxiety   [] ? History of major depression.  Physical Examination  BP 115/66 (BP Location: Right Arm)   Pulse 83   Resp 16   Ht 5' 5.5" (1.664 m)   Wt 142 lb 6.4 oz (64.6 kg)   BMI 23.34 kg/m  Gen:  WD/WN, NAD.  Appears younger than stated age Head: [place]/AT, No temporalis wasting. Ear/Nose/Throat: Hearing grossly intact, nares w/o erythema or drainage Eyes: Conjunctiva clear. Sclera non-icteric Neck: Supple.  Trachea midline Pulmonary:  Good air movement, no use of accessory muscles.  Cardiac: RRR, no JVD Vascular:  Vessel Right Left  Radial Palpable Palpable               Musculoskeletal: M/S 5/5 throughout.  No deformity or atrophy.  No appreciable lower extremity edema. Neurologic: Sensation grossly intact  in extremities.  Symmetrical.  Speech is fluent.  Psychiatric: Judgment intact, Mood & affect appropriate for pt's clinical situation. Dermatologic: No rashes or ulcers noted.  No cellulitis  or open wounds.       Labs Recent Results (from the past 2160 hour(s))  Hemoglobin A1c     Status: None   Collection Time: 02/11/20 10:50 AM  Result Value Ref Range   Hgb A1c MFr Bld 5.5 4.6 - 6.5 %    Comment: Glycemic Control Guidelines for People with Diabetes:Non Diabetic:  <6%Goal of Therapy: <7%Additional Action Suggested:  >8%   Renal function panel     Status: None   Collection Time: 02/11/20 10:50 AM  Result Value Ref Range   Sodium 138 135 - 145 mEq/L   Potassium 3.9 3.5 - 5.1 mEq/L   Chloride 105 96 - 112 mEq/L   CO2 29 19 - 32 mEq/L   Albumin 4.2 3.5 - 5.2 g/dL   BUN 23 6 - 23 mg/dL   Creatinine, Ser 0.87 0.40 - 1.20 mg/dL   Glucose, Bld 92 70 - 99 mg/dL   Phosphorus 3.2 2.3 - 4.6 mg/dL   GFR 62.76 >60.00 mL/min   Calcium 9.1 8.4 - 10.5 mg/dL  CBC with Differential/Platelet     Status: Abnormal   Collection Time: 02/11/20 10:50 AM  Result Value Ref Range   WBC 4.1 4.0 - 10.5 K/uL   RBC 3.80 (L) 3.87 - 5.11 Mil/uL   Hemoglobin 11.8 (L) 12.0 - 15.0 g/dL   HCT 35.3 (L) 36.0 - 46.0 %   MCV 92.9 78.0 - 100.0 fl   MCHC 33.6 30.0 - 36.0 g/dL   RDW 13.8 11.5 - 15.5 %   Platelets 112.0 (L) 150.0 - 400.0 K/uL   Neutrophils Relative % 58.7 43.0 - 77.0 %   Lymphocytes Relative 30.0 12.0 - 46.0 %   Monocytes Relative 7.2 3.0 - 12.0 %   Eosinophils Relative 3.2 0.0 - 5.0 %   Basophils Relative 0.9 0.0 - 3.0 %   Neutro Abs 2.4 1.4 - 7.7 K/uL   Lymphs Abs 1.2 0.7 - 4.0 K/uL   Monocytes Absolute 0.3 0.1 - 1.0 K/uL   Eosinophils Absolute 0.1 0.0 - 0.7 K/uL   Basophils Absolute 0.0 0.0 - 0.1 K/uL  CUP PACEART REMOTE DEVICE CHECK     Status: None   Collection Time: 02/29/20  8:23 AM  Result Value Ref Range   Date Time Interrogation Session [id number]    Pulse Generator Manufacturer MER[employer]    Pulse Gen Model ADDRL1 Adapta    Pulse Gen Serial Number [id number]    Clinic Name [facility]    Implantable Pulse Generator Type Implantable Pulse Generator    Implantable  Pulse Generator Implant Date [id number]    Implantable Lead Manufacturer [facility]    Implantable Lead Model 5076 CapSureFix Novus    Implantable Lead Serial Number [id number]    Implantable Lead Implant Date [id number]    Implantable Lead Location Detail 1 UNKNOWN    Implantable Lead Location [id number]    Implantable Lead Manufacturer [facility]    Implantable Lead Model 5076 CapSureFix Novus    Implantable Lead Serial Number [id number]    Implantable Lead Implant Date [id number]    Implantable Lead Location Detail 1 APEX    Implantable Lead Location [id number]    Lead Channel Setting Sensing Sensitivity 4.00 mV   Lead Channel Setting Pacing Amplitude 2.000 V   Lead Channel Setting Pacing Pulse Width 0.40 ms  Lead Channel Setting Pacing Amplitude 2.500 V   Lead Channel Impedance Value 452 ohm   Lead Channel Pacing Threshold Amplitude 0.625 V   Lead Channel Pacing Threshold Pulse Width 0.40 ms   Lead Channel Impedance Value 734 ohm   Lead Channel Pacing Threshold Amplitude 0.500 V   Lead Channel Pacing Threshold Pulse Width 0.40 ms   Battery Status OK    Battery Remaining Longevity 87 mo   Battery Voltage 2.79 V   Battery Impedance 354 ohm   Brady Statistic AP VP Percent 95 %   Brady Statistic AS VP Percent 5 %   Brady Statistic AP VS Percent 0 %   Brady Statistic AS VS Percent 0 %    Radiology No results found.  Assessment/Plan Hyperlipidemia lipid control important in reducing the progression of atherosclerotic disease. Continue statin therapy   Atrial fibrillation, persistent ([place]) On anticoagulation   Focal seizures ([place]) New diagnosis since her last visit.  Follows with neurology.  Carotid stenosis Carotid duplex today shows stenosis in the lower end of the 40 to 59% range bilaterally with minimal progression from previous studies from about a year and a half ago. On appropriate medical therapy with Crestor and Eliquis.  No change in that for the time being.  Is on the Eliquis for  atrial fibrillation, but this is certainly adequate for her carotid disease as well.  We will follow this on an annual basis.    Leotis Pain, MD  04/21/2020 10:39 AM    This note was created with Dragon medical transcription system.  Any errors from dictation are purely unintentional

## 2020-05-06 DIAGNOSIS — R102 Pelvic and perineal pain: Secondary | ICD-10-CM

## 2020-05-18 ENCOUNTER — Telehealth: Payer: Self-pay | Admitting: Internal Medicine

## 2020-05-18 DIAGNOSIS — K4090 Unilateral inguinal hernia, without obstruction or gangrene, not specified as recurrent: Secondary | ICD-10-CM

## 2020-05-18 NOTE — Telephone Encounter (Signed)
The patient is needing to be referred to Dr. Hervey Ard for inguinal hernia.

## 2020-05-18 NOTE — Telephone Encounter (Signed)
Referral made 

## 2020-05-19 NOTE — Telephone Encounter (Signed)
Pt is aware.  

## 2020-05-22 ENCOUNTER — Other Ambulatory Visit: Payer: Self-pay | Admitting: Family Medicine

## 2020-05-22 DIAGNOSIS — R102 Pelvic and perineal pain: Secondary | ICD-10-CM

## 2020-05-22 NOTE — Telephone Encounter (Signed)
Not a Dr. Glori Bickers pt, will route to PCP

## 2020-05-30 ENCOUNTER — Ambulatory Visit (INDEPENDENT_AMBULATORY_CARE_PROVIDER_SITE_OTHER): Payer: Medicare Other | Admitting: *Deleted

## 2020-05-30 DIAGNOSIS — I442 Atrioventricular block, complete: Secondary | ICD-10-CM

## 2020-05-30 LAB — CUP PACEART REMOTE DEVICE CHECK
Battery Impedance: 403 Ohm
Battery Remaining Longevity: 81 mo
Battery Voltage: 2.79 V
Brady Statistic AP VP Percent: 96 %
Brady Statistic AP VS Percent: 0 %
Brady Statistic AS VP Percent: 4 %
Brady Statistic AS VS Percent: 0 %
Date Time Interrogation Session: 20210629103602
Implantable Lead Implant Date: 20161205
Implantable Lead Implant Date: 20170321
Implantable Lead Location: 753859
Implantable Lead Location: 753860
Implantable Lead Model: 5076
Implantable Lead Model: 5076
Implantable Pulse Generator Implant Date: 20161205
Lead Channel Impedance Value: 428 Ohm
Lead Channel Impedance Value: 676 Ohm
Lead Channel Pacing Threshold Amplitude: 0.625 V
Lead Channel Pacing Threshold Amplitude: 0.625 V
Lead Channel Pacing Threshold Pulse Width: 0.4 ms
Lead Channel Pacing Threshold Pulse Width: 0.4 ms
Lead Channel Setting Pacing Amplitude: 2 V
Lead Channel Setting Pacing Amplitude: 2.5 V
Lead Channel Setting Pacing Pulse Width: 0.4 ms
Lead Channel Setting Sensing Sensitivity: 4 mV

## 2020-06-01 NOTE — Telephone Encounter (Signed)
Pt called for appt with Dr. Derrel Nip but we don't have any openings. Is Dr. Derrel Nip willing to work her in.

## 2020-06-01 NOTE — Progress Notes (Signed)
Remote pacemaker transmission.   

## 2020-06-02 NOTE — Telephone Encounter (Signed)
Spoke with pt and she stated that her symptoms have gotten much better and does not think she needs an appt at this time.

## 2020-06-06 NOTE — Telephone Encounter (Signed)
Pt called and said that the symptoms are back and scheduled an appt for next week

## 2020-06-14 ENCOUNTER — Encounter: Payer: Self-pay | Admitting: Internal Medicine

## 2020-06-14 ENCOUNTER — Other Ambulatory Visit: Payer: Self-pay

## 2020-06-14 ENCOUNTER — Ambulatory Visit (INDEPENDENT_AMBULATORY_CARE_PROVIDER_SITE_OTHER): Payer: Medicare Other | Admitting: Internal Medicine

## 2020-06-14 VITALS — BP 104/64 | HR 87 | Temp 98.0°F | Resp 15 | Ht 65.5 in | Wt 141.4 lb

## 2020-06-14 DIAGNOSIS — R7303 Prediabetes: Secondary | ICD-10-CM

## 2020-06-14 DIAGNOSIS — R569 Unspecified convulsions: Secondary | ICD-10-CM

## 2020-06-14 DIAGNOSIS — D6869 Other thrombophilia: Secondary | ICD-10-CM | POA: Insufficient documentation

## 2020-06-14 DIAGNOSIS — F418 Other specified anxiety disorders: Secondary | ICD-10-CM

## 2020-06-14 DIAGNOSIS — I4819 Other persistent atrial fibrillation: Secondary | ICD-10-CM | POA: Diagnosis not present

## 2020-06-14 DIAGNOSIS — E782 Mixed hyperlipidemia: Secondary | ICD-10-CM | POA: Diagnosis not present

## 2020-06-14 DIAGNOSIS — K4090 Unilateral inguinal hernia, without obstruction or gangrene, not specified as recurrent: Secondary | ICD-10-CM | POA: Diagnosis not present

## 2020-06-14 DIAGNOSIS — I5022 Chronic systolic (congestive) heart failure: Secondary | ICD-10-CM

## 2020-06-14 DIAGNOSIS — I6523 Occlusion and stenosis of bilateral carotid arteries: Secondary | ICD-10-CM | POA: Diagnosis not present

## 2020-06-14 DIAGNOSIS — N3000 Acute cystitis without hematuria: Secondary | ICD-10-CM

## 2020-06-14 MED ORDER — AMOXICILLIN 500 MG PO TABS: ORAL_TABLET | ORAL | 6 refills | Status: DC

## 2020-06-14 MED ORDER — FLUCONAZOLE 150 MG PO TABS: 150.0000 mg | ORAL_TABLET | Freq: Every day | ORAL | 0 refills | Status: DC

## 2020-06-14 NOTE — Assessment & Plan Note (Addendum)
Reviewed her entire visit with Dr Bary Castilla, as well as all of her reasons for wanting to avoid surgery.   Agree with waiting

## 2020-06-14 NOTE — Progress Notes (Signed)
Subjective:  Patient ID: [name] [name], female    DOB: 1941/05/29  Age: 79 y.o. MRN: [id number]  CC: The primary encounter diagnosis was Mixed hyperlipidemia. Diagnoses of Acquired thrombophilia ([place]), Inguinal hernia of right side without obstruction or gangrene, Atrial fibrillation, persistent ([place]), Anxiety associated with depression, Prediabetes, Chronic systolic congestive heart failure ([place]), Focal seizures ([place]), and Acute cystitis without hematuria were also pertinent to this visit.  HPI [name] [name] presents for follow up on multiple chronic issues   This visit occurred during the SARS-CoV-2 public health emergency.  Safety protocols were in place, including screening questions prior to the visit, additional usage of staff PPE, and extensive cleaning of exam room while observing appropriate contact time as indicated for disinfecting solutions.   Screening for depression reviewed.  Regrets decision to move to [facility] . But staying in her home reminded her daily for 7 years  of husband [name] [name]'s death  Found herself crying last week because of the effort required to bring In her groceries from the car (long walk)    Right inguinal hernia confirmed by surgeon.  Was recently aggravated by lifting a heavy sewing machine.   No surgery planned at this point due to patient's fear of surgical complications including blood clots, mental deterioration, and worsening epilepsy.  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  She had a Had a dental procedure on April 12 requiring use of lidocaine only,  Had a split second of seizure like activity that did not repeat , she credits the Briviact for aborting the seizure.  Acquired thrombophilia:  Taking Eliquis  For embolic CVA risk mitigation . No bleeding episodes or excessive bruising   Sleeping better with COVID isolation restrictions.  Tries not to nap in the afternoons.  Back pain : managed with rare use of  fioricet. Uses it sparingly just for pain that leads to anxiety . No refills in 2021. Max use 2 daily,  Not using daily   Occasional seasonal rhinitis managed with prn nasal sprays   Cc:  Took PCN  In late June to July 3 for dental cleaning.  Thought she developed a fungal infection (vaginal itching and burning) but took probiotics, Estrace, aloe gel, and used Cottonelle wipes and symptoms resolved after 5 or 6 days.  Then  developed diarrhea that lasted a total of  3-4 days before stools returned to normal.  Takes  probiotics.  Osteoporosis on Prolia  3 falls since March  1) ankle everted  while carrying a potted plant out of [employer].  No face plant.  Bruised left buttock .  2) She fell in June while  carrying a box of polyethylene sleeves,  The bottom fell out and slipped on a sleeve ,  Bruised left shoulder on bedroom furniture  3) reaching to open a door at [employer] with the wrong hand ,  The handle cut her finger and she let go and slipped on a slant. Bled for several days,   No stitches.    Outpatient Medications Prior to Visit  Medication Sig Dispense Refill  . Ascorbic Acid (VITAMIN C) 1000 MG tablet Take 11 tablets by mouth daily.    Marland Kitchen azelastine (ASTELIN) 0.1 % nasal spray Place 2 sprays into both nostrils 2 (two) times daily. Use in each nostril as directed 30 mL 12  . b complex vitamins tablet Take 1 tablet by mouth daily.    Marland Kitchen BRIVIACT 25 MG TABS tablet  Take 25 mg by mouth 2 (two) times daily.    . butalbital-acetaminophen-caffeine (FIORICET) 50-325-40 MG tablet TAKE 1 TABLET BY MOUTH AS NEEDED FOR BACK PAIN OR MIGRAINE HEADACHE 90 tablet 5  . Cholecalciferol (VITAMIN D) 2000 units CAPS Take 2,000 Units by mouth daily.    Marland Kitchen ELIQUIS 5 MG TABS tablet TAKE ONE TABLET TWICE DAILY 180 tablet 1  . estradiol (ESTRACE VAGINAL) 0.1 MG/GM vaginal cream PLACE 1 APPLICATORFUL VAGINALLY  2 NIGHT PER WEEK 42.5 g 5  . fluticasone (FLONASE) 50 MCG/ACT nasal spray USE 2 SPRAYS IN BOTH NOSTRILS DAILY  16 g 11  . folic acid (FOLVITE) 063 MCG tablet     . furosemide (LASIX) 40 MG tablet Take 1 tablet (40 mg total) by mouth as directed. 30 tablet 11  . hydroquinone 4 % cream     . lamoTRIgine (LAMICTAL) 150 MG tablet Take 150 mg by mouth 2 (two) times daily.    . metoprolol tartrate (LOPRESSOR) 25 MG tablet TAKE 1/2 TABLET BY MOUTH TWICE DAILY 90 tablet 3  . montelukast (SINGULAIR) 10 MG tablet TAKE ONE TABLET BY MOUTH AT BEDTIME 90 tablet 1  . polyethylene glycol powder (MIRALAX) 17 GM/SCOOP powder Take 1 Dose by mouth daily.    . rosuvastatin (CRESTOR) 40 MG tablet TAKE ONE TABLET EVERY DAY 90 tablet 1  . triamcinolone cream (KENALOG) 0.1 % Apply 1 application topically 2 (two) times daily. 30 g 0  . VENTOLIN HFA 108 (90 Base) MCG/ACT inhaler     . amoxicillin (AMOXIL) 500 MG tablet Take 4 tablets 60 minutes prior to dental procedures 30 tablet 6   No facility-administered medications prior to visit.    Review of Systems;  Patient denies headache, fevers, malaise, unintentional weight loss, skin rash, eye pain, sinus congestion and sinus pain, sore throat, dysphagia,  hemoptysis , cough, dyspnea, wheezing, chest pain, palpitations, orthopnea, edema, abdominal pain, nausea, melena, diarrhea, constipation, flank pain, dysuria, hematuria, urinary  Frequency, nocturia, numbness, tingling,  Focal weakness, Loss of consciousness,  Tremor, insomnia, depression, anxiety, and suicidal ideation.      Objective:  BP 104/64 (BP Location: Left Arm, Patient Position: Sitting, Cuff Size: Normal)   Pulse 87   Temp 98 F (36.7 C) (Oral)   Resp 15   Ht 5' 5.5" (1.664 m)   Wt 141 lb 6.4 oz (64.1 kg)   SpO2 98%   BMI 23.17 kg/m   BP Readings from Last 3 Encounters:  06/14/20 104/64  04/21/20 115/66  02/08/20 112/64    Wt Readings from Last 3 Encounters:  06/14/20 141 lb 6.4 oz (64.1 kg)  04/21/20 142 lb 6.4 oz (64.6 kg)  02/08/20 142 lb (64.4 kg)    General appearance: alert, cooperative  and appears stated age Ears: normal TM's and external ear canals both ears Throat: lips, mucosa, and tongue normal; teeth and gums normal Neck: no adenopathy, no carotid bruit, supple, symmetrical, trachea midline and thyroid not enlarged, symmetric, no tenderness/mass/nodules Back: symmetric, no curvature. ROM normal. No CVA tenderness. Lungs: clear to auscultation bilaterally Heart: regular rate and rhythm, S1, S2 normal, no murmur, click, rub or gallop Abdomen: soft, non-tender; bowel sounds normal; no masses,  no organomegaly Pulses: 2+ and symmetric Skin: Skin color, texture, turgor normal. No rashes or lesions Lymph nodes: Cervical, supraclavicular, and axillary nodes normal.  Lab Results  Component Value Date   HGBA1C 5.5 02/11/2020   HGBA1C 5.7 05/21/2019   HGBA1C 5.3 04/07/2018    Lab Results  Component Value Date   CREATININE 0.95 06/14/2020   CREATININE 0.87 02/11/2020   CREATININE 0.92 05/21/2019    Lab Results  Component Value Date   WBC 4.7 06/14/2020   HGB 11.1 (L) 06/14/2020   HCT 32.8 (L) 06/14/2020   PLT 120.0 (L) 06/14/2020   GLUCOSE 119 (H) 06/14/2020   CHOL 143 05/21/2019   TRIG 60.0 05/21/2019   HDL 59.50 05/21/2019   LDLDIRECT 113.0 02/15/2016   LDLCALC 71 05/21/2019   ALT 13 06/14/2020   AST 21 06/14/2020   NA 141 06/14/2020   K 4.1 06/14/2020   CL 103 06/14/2020   CREATININE 0.95 06/14/2020   BUN 26 (H) 06/14/2020   CO2 31 06/14/2020   TSH 4.24 05/21/2019   INR 1.11 05/06/2017   HGBA1C 5.5 02/11/2020    No results found.  Assessment & Plan:   Problem List Items Addressed This Visit      Unprioritized   Hyperlipidemia - Primary (Chronic)   Relevant Orders   Comprehensive metabolic panel (Completed)   Anxiety associated with depression (Chronic)    Her mood seems well adjusted currently.  She is using alprazolam prn and fioricet less than once daily.  No changes today       Atrial fibrillation, persistent (HCC)    currently rate  controlled and has no signs of heart failure on exam.  Tolerating Eliquis for mitigation of embolic stroke risk       Chronic systolic congestive heart failure ([place])    LVEF had improved to 40-45% by March 2018 ECHO . She is asymptomatic and repeat ECHO will likely be better.       Focal seizures ([place])    Diagnosed and treated by neurology with recent episode which per patient felt like an aborted seizure       Inguinal hernia of right side without obstruction or gangrene    Reviewed her entire visit with Dr Bary Castilla, as well as all of her reasons for wanting to avoid surgery.   Agree with waiting       Acquired thrombophilia ([place])    Embolic stroke risk managed by Xarelto without adverse events in spite of 3 minor falls in the last year      Relevant Orders   CBC with Differential/Platelet (Completed)   UTI (urinary tract infection)    Currently asymptomatic.  Self treated.  Advised to submit urine for analysis next episode with a 2 day follow up OV      Relevant Medications   fluconazole (DIFLUCAN) 150 MG tablet   RESOLVED: Prediabetes    Rechecking a1c given her lack of exercise and weight gain  Lab Results  Component Value Date   HGBA1C 5.5 02/11/2020           A total of 40 minutes was spent with patient more than half of which was spent in counseling patient on the above mentioned issues , reviewing and explaining recent labs and imaging studies done, and coordination of care.   I am having [name] [name] start on fluconazole. I am also having her maintain her Vitamin D, b complex vitamins, folic acid, Ventolin HFA, butalbital-acetaminophen-caffeine, triamcinolone cream, furosemide, metoprolol tartrate, vitamin C, polyethylene glycol powder, estradiol, azelastine, lamoTRIgine, Eliquis, Briviact, rosuvastatin, fluticasone, hydroquinone, montelukast, and amoxicillin.  Meds ordered this encounter  Medications  . fluconazole (DIFLUCAN) 150 MG tablet    Sig: Take 1  tablet (150 mg total) by mouth daily.    Dispense:  2 tablet  Refill:  0  . amoxicillin (AMOXIL) 500 MG tablet    Sig: Take 4 tablets 60 minutes prior to dental procedures    Dispense:  30 tablet    Refill:  6    Medications Discontinued During This Encounter  Medication Reason  . amoxicillin (AMOXIL) 500 MG tablet Reorder    Follow-up: No follow-ups on file.   [name], MD

## 2020-06-14 NOTE — Assessment & Plan Note (Addendum)
Embolic stroke risk managed by Xarelto without adverse events in spite of 3 minor falls in the last year

## 2020-06-14 NOTE — Patient Instructions (Signed)
I  Have refilled your amoxicillin and the fluconazole (for fungal infection)   CALL us the next time you have diarrhea or urinary symptoms for > 48 hours  We will set up your tests and an office visit

## 2020-06-15 LAB — COMPREHENSIVE METABOLIC PANEL
ALT: 13 U/L (ref 0–35)
AST: 21 U/L (ref 0–37)
Albumin: 4.6 g/dL (ref 3.5–5.2)
Alkaline Phosphatase: 75 U/L (ref 39–117)
BUN: 26 mg/dL — ABNORMAL HIGH (ref 6–23)
CO2: 31 mEq/L (ref 19–32)
Calcium: 9.6 mg/dL (ref 8.4–10.5)
Chloride: 103 mEq/L (ref 96–112)
Creatinine, Ser: 0.95 mg/dL (ref 0.40–1.20)
GFR: 56.65 mL/min — ABNORMAL LOW (ref >60.00)
Glucose, Bld: 119 mg/dL — ABNORMAL HIGH (ref 70–99)
Potassium: 4.1 mEq/L (ref 3.5–5.1)
Sodium: 141 mEq/L (ref 135–145)
Total Bilirubin: 0.3 mg/dL (ref 0.2–1.2)
Total Protein: 6.2 g/dL (ref 6.0–8.3)

## 2020-06-15 LAB — CBC WITH DIFFERENTIAL/PLATELET
Basophils Absolute: 0 10*3/uL (ref 0.0–0.1)
Basophils Relative: 0.7 % (ref 0.0–3.0)
Eosinophils Absolute: 0.2 10*3/uL (ref 0.0–0.7)
Eosinophils Relative: 3.6 % (ref 0.0–5.0)
HCT: 32.8 % — ABNORMAL LOW (ref 36.0–46.0)
Hemoglobin: 11.1 g/dL — ABNORMAL LOW (ref 12.0–15.0)
Lymphocytes Relative: 25.8 % (ref 12.0–46.0)
Lymphs Abs: 1.2 10*3/uL (ref 0.7–4.0)
MCHC: 33.9 g/dL (ref 30.0–36.0)
MCV: 93.4 fl (ref 78.0–100.0)
Monocytes Absolute: 0.4 10*3/uL (ref 0.1–1.0)
Monocytes Relative: 7.8 % (ref 3.0–12.0)
Neutro Abs: 2.9 10*3/uL (ref 1.4–7.7)
Neutrophils Relative %: 62.1 % (ref 43.0–77.0)
Platelets: 120 10*3/uL — ABNORMAL LOW (ref 150.0–400.0)
RBC: 3.51 Mil/uL — ABNORMAL LOW (ref 3.87–5.11)
RDW: 14 % (ref 11.5–15.5)
WBC: 4.7 10*3/uL (ref 4.0–10.5)

## 2020-06-16 ENCOUNTER — Encounter: Payer: Self-pay | Admitting: Internal Medicine

## 2020-06-16 DIAGNOSIS — N390 Urinary tract infection, site not specified: Secondary | ICD-10-CM | POA: Insufficient documentation

## 2020-06-16 NOTE — Assessment & Plan Note (Signed)
Currently asymptomatic.  Self treated.  Advised to submit urine for analysis next episode with a 2 day follow up OV

## 2020-06-16 NOTE — Assessment & Plan Note (Signed)
Diagnosed and treated by neurology with recent episode which per patient felt like an aborted seizure

## 2020-06-16 NOTE — Assessment & Plan Note (Signed)
currently rate controlled and has no signs of heart failure on exam.  Tolerating Eliquis for mitigation of embolic stroke risk  °

## 2020-06-16 NOTE — Assessment & Plan Note (Signed)
LVEF had improved to 40-45% by March 2018 ECHO . She is asymptomatic and repeat ECHO will likely be better.

## 2020-06-16 NOTE — Assessment & Plan Note (Signed)
Rechecking a1c given her lack of exercise and weight gain  Lab Results  Component Value Date   HGBA1C 5.5 02/11/2020

## 2020-06-16 NOTE — Assessment & Plan Note (Signed)
Her mood seems well adjusted currently.  She is using alprazolam prn and fioricet less than once daily.  No changes today

## 2020-06-29 ENCOUNTER — Other Ambulatory Visit: Payer: Self-pay | Admitting: Cardiovascular Disease

## 2020-06-29 ENCOUNTER — Other Ambulatory Visit: Payer: Self-pay | Admitting: Internal Medicine

## 2020-07-07 ENCOUNTER — Ambulatory Visit (INDEPENDENT_AMBULATORY_CARE_PROVIDER_SITE_OTHER): Payer: Medicare Other | Admitting: Internal Medicine

## 2020-07-07 ENCOUNTER — Other Ambulatory Visit: Payer: Self-pay

## 2020-07-07 ENCOUNTER — Encounter: Payer: Self-pay | Admitting: Internal Medicine

## 2020-07-07 VITALS — BP 104/64 | HR 86 | Ht 65.5 in | Wt 143.0 lb

## 2020-07-07 DIAGNOSIS — I6523 Occlusion and stenosis of bilateral carotid arteries: Secondary | ICD-10-CM | POA: Diagnosis not present

## 2020-07-07 DIAGNOSIS — I495 Sick sinus syndrome: Secondary | ICD-10-CM

## 2020-07-07 DIAGNOSIS — Z950 Presence of cardiac pacemaker: Secondary | ICD-10-CM

## 2020-07-07 DIAGNOSIS — I4819 Other persistent atrial fibrillation: Secondary | ICD-10-CM | POA: Diagnosis not present

## 2020-07-07 DIAGNOSIS — Z952 Presence of prosthetic heart valve: Secondary | ICD-10-CM

## 2020-07-07 NOTE — Progress Notes (Signed)
HPI [name] [name] returns today for PPM followup. 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  She has known atrial fib and is on systemic anti-coagulation. She has had problems with what she describes as a type of seizure. She has done well in the interim with no chest pain or sob. No edema. She broke her foot a couple of weeks ago.  Allergies  Allergen Reactions  . Epinephrine Palpitations  . Meloxicam Other (See Comments)    Severe reflux  . Codeine Nausea And Vomiting    migraine  . Dextromethorphan     seizures  . Diphenhydramine Other (See Comments)    seizure  . Diphenhydramine Hcl     Other reaction(s): Other (See Comments)  . Doxycycline Itching and Swelling    Facial  . Hydrocodone Nausea And Vomiting    MIGRAINE  . Hydromorphone Nausea And Vomiting    migraine  . Oxycodone Nausea And Vomiting    Severe migraine  . Propofol Other (See Comments)     Current Outpatient Medications  Medication Sig Dispense Refill  . amoxicillin (AMOXIL) 500 MG tablet Take 4 tablets 60 minutes prior to dental procedures 30 tablet 6  . Ascorbic Acid (VITAMIN C) 1000 MG tablet Take 1 tablet by mouth daily.     Marland Kitchen azelastine (ASTELIN) 0.1 % nasal spray Place 2 sprays into both nostrils 2 (two) times daily. Use in each nostril as directed 30 mL 12  . b complex vitamins tablet Take 1 tablet by mouth daily.    Marland Kitchen BRIVIACT 25 MG TABS tablet Take 25 mg by mouth 2 (two) times daily.    . butalbital-acetaminophen-caffeine (FIORICET) 50-325-40 MG tablet TAKE 1 TABLET BY MOUTH AS NEEDED FOR BACK PAIN OR MIGRAINE HEADACHE 90 tablet 5  . Cholecalciferol (VITAMIN D) 2000 units CAPS Take 2,000 Units by mouth daily.    Marland Kitchen ELIQUIS 5 MG TABS tablet TAKE ONE TABLET TWICE DAILY 180 tablet 1  . estradiol (ESTRACE VAGINAL) 0.1 MG/GM vaginal cream PLACE 1 APPLICATORFUL VAGINALLY  2 NIGHT PER WEEK 42.5 g 5  . fluconazole  (DIFLUCAN) 150 MG tablet Take 1 tablet (150 mg total) by mouth daily. 2 tablet 0  . fluticasone (FLONASE) 50 MCG/ACT nasal spray USE 2 SPRAYS IN BOTH NOSTRILS DAILY 16 g 11  . folic acid (FOLVITE) 947 MCG tablet     . furosemide (LASIX) 40 MG tablet Take 1 tablet (40 mg total) by mouth as directed. 30 tablet 11  . hydroquinone 4 % cream     . lamoTRIgine (LAMICTAL) 150 MG tablet Take 150 mg by mouth 2 (two) times daily.    . metoprolol tartrate (LOPRESSOR) 25 MG tablet Take 0.5 tablets (12.5 mg total) by mouth 2 (two) times daily. 90 tablet 0  . montelukast (SINGULAIR) 10 MG tablet TAKE ONE TABLET BY MOUTH AT BEDTIME 90 tablet 1  . polyethylene glycol powder (MIRALAX) 17 GM/SCOOP powder Take 1 Dose by mouth daily.    . rosuvastatin (CRESTOR) 40 MG tablet TAKE ONE TABLET EVERY DAY 90 tablet 1  . triamcinolone cream (KENALOG) 0.1 % Apply 1 application topically 2 (two) times daily. 30 g 0  . VENTOLIN HFA 108 (90 Base) MCG/ACT inhaler      No current facility-administered medications for this visit.     Past Medical History:  Diagnosis Date  . Acute on chronic diastolic (congestive) heart failure ([place])   .  Allergy    See list  . Anemia 2018  . Anxiety    Occasionally take Xanax for sleep  . Anxiety associated with depression    Prn alprazolam   . Anxiety associated with depression   . Arthritis    Hands, Back  . Atrial fibrillation, persistent ([place])    DCCV 08/22/2015  . Bradycardia post-op bradycardia, pacer dependent   MDT PPM 11/06/15, Dr. Lovena Le  . Cataract    Left eye  . Diverticulosis   . Focal seizures ([place])   . Heart murmur   . Hemorrhoid   . Hepatic cyst    innumerable  . 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 Hyperlipidemia   . IBS (irritable bowel syndrome)   . 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 Migraine   . MVP (mitral valve prolapse)   . Nodule of right lung   . Osteoporosis of forearm   . RBBB   . Restrictive lung disease    Mild on PFT & likely  cardiac in etiology   . S/P Minimally invasive maze operation for atrial fibrillation 10/31/2015   Complete bilateral atrial lesion set using cryothermy and bipolar radiofrequency ablation with clipping of LA appendage via right mini thoracotomy approach  . S/P minimally invasive mitral valve replacement with bioprosthetic valve 10/31/2015   33 mm [facility] Mitral bovine bioprosthetic tissue valve placed via right mini thoracotomy approach  . Severe mitral regurgitation   . 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 SVT (supraventricular tachycardia) ([place])   . Thoracic aorta atherosclerosis ([place])   . TIA (transient ischemic attack)     ROS:   All systems reviewed and negative except as noted in the HPI.   Past Surgical History:  Procedure Laterality Date  . BREAST EXCISIONAL BIOPSY Right Late 80s   Negative X2  . CARDIAC CATHETERIZATION N/A 10/18/2015   Procedure: Right/Left Heart Cath and Coronary Angiography;  Surgeon: Sherren Mocha, MD;  Location: [place] [facility];  Service: Cardiovascular;  Laterality: N/A;  . CARDIOVERSION N/A 08/22/2015   Procedure: CARDIOVERSION;  Surgeon: Thayer Headings, MD;  Location: [facility];  Service: Cardiovascular;  Laterality: N/A;  . COLONOSCOPY  2003  . EP IMPLANTABLE DEVICE N/A 11/06/2015   Procedure: Pacemaker Implant;  Surgeon: Evans Lance, MD;  Location: [place] [facility];  Service: Cardiovascular;  Laterality: N/A;  . EP IMPLANTABLE DEVICE N/A 02/20/2016   Procedure: Lead Extraction;  Surgeon: Evans Lance, MD;  Location: [place] [facility];  Service: Cardiovascular;  Laterality: N/A;  . EYE SURGERY Right 2013  . FOOT SURGERY    . MANDIBLE FRACTURE SURGERY  03-26-13  . MINIMALLY INVASIVE MAZE PROCEDURE N/A 10/31/2015   Procedure: MINIMALLY INVASIVE MAZE PROCEDURE;  Surgeon: Rexene Alberts, MD;  Location: [facility];  Service: Open Heart Surgery;  Laterality: N/A;  . MITRAL VALVE REPLACEMENT Right 10/31/2015   Procedure:  MINIMALLY INVASIVE MITRAL VALVE (MV) REPLACEMENT;  Surgeon: Rexene Alberts, MD;  Location: [facility];  Service: Open Heart Surgery;  Laterality: Right;  . PACEMAKER LEAD REMOVAL  02/20/2016  . TEE WITH CARDIOVERSION    . TEE WITHOUT CARDIOVERSION N/A 08/22/2015   Procedure: TRANSESOPHAGEAL ECHOCARDIOGRAM (TEE);  Surgeon: Thayer Headings, MD;  Location: [facility];  Service: Cardiovascular;  Laterality: N/A;  . TEE WITHOUT CARDIOVERSION N/A 10/31/2015   Procedure: TRANSESOPHAGEAL ECHOCARDIOGRAM (TEE);  Surgeon: Rexene Alberts, MD;  Location: [facility];  Service: Open Heart Surgery;  Laterality: N/A;  . TUBAL LIGATION    . VARICOSE VEIN SURGERY Right      Family History  Problem Relation Age of Onset  . Hypertension Mother   . Arrhythmia Mother   . Heart failure Mother   . Arrhythmia Brother   . Stroke Brother 14       cerebral hemorrhage, nonsmoker, no HTN  . Prostate cancer Brother   . Stroke Father        from an aneurysm  . Stroke Maternal Aunt 83       cerebral hemorrhage  . Liver cancer Maternal Grandmother   . Atrial fibrillation Son   . Celiac disease Son   . Heart attack Neg Hx   . Breast cancer Neg Hx      Social History   Socioeconomic History  . Marital status: Widowed    Spouse name: Deceased  . Number of children: 2  . Years of education: 84  . Highest education level: Not on file  Occupational History  . Occupation: Retired  Tobacco Use  . Smoking status: Never Smoker  . Smokeless tobacco: Never Used  Vaping Use  . Vaping Use: Never used  Substance and Sexual Activity  . Alcohol use: No    Alcohol/week: 3.0 standard drinks    Types: 3 Standard drinks or equivalent per week  . Drug use: No  . Sexual activity: Never  Other Topics Concern  . Not on file  Social History Narrative   She is a widow.  Husband died from metastatic renal cell cancer. Originally from [place]. Previously lived in [place] from 1975-[place]. Moved to [place] in 2007. No mold exposure  recently but did have it through a prior work exposure in 1993. Has a masters in public health. No bird exposure.      [facility] (09/29/17):   She has moved into a retirement community since last appointment. She reports she had testing at her new residence that was positive for mold. It has since been treated.    Social Determinants of Health   Financial Resource Strain:   . Difficulty of Paying Living Expenses:   Food Insecurity:   . Worried About Charity fundraiser in the Last Year:   . Arboriculturist in the Last Year:   Transportation Needs:   . Film/video editor (Medical):   Marland Kitchen Lack of Transportation (Non-Medical):   Physical Activity:   . Days of Exercise per Week:   . Minutes of Exercise per Session:   Stress:   . Feeling of Stress :   Social Connections:   . Frequency of Communication with Friends and Family:   . Frequency of Social Gatherings with Friends and Family:   . Attends Religious Services:   . Active Member of Clubs or Organizations:   . Attends Archivist Meetings:   Marland Kitchen Marital Status:   Intimate Partner Violence:   . Fear of Current or Ex-Partner:   . Emotionally Abused:   Marland Kitchen Physically Abused:   . Sexually Abused:      BP 104/64   Pulse 86   Ht 5' 5.5" (1.664 m)   Wt 143 lb (64.9 kg)   SpO2 98%   BMI 23.43 kg/m   Physical Exam:  Well appearing NAD HEENT: Unremarkable Neck:  No JVD, no thyromegally Lymphatics:  No adenopathy Back:  No CVA tenderness Lungs:  Clear with no wheezes HEART:  Regular rate rhythm, no murmurs, no rubs, no clicks Abd:  soft, positive bowel sounds, no organomegally, no rebound, no guarding Ext:  2 plus pulses, no edema, no cyanosis, no clubbing Skin:  No rashes no nodules Neuro:  CN II through XII intact, motor grossly intact  DEVICE  Normal device function.  See PaceArt for details.   Assess/Plan: 1. 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 We will recheck in several months. 2. CHB  - she is asymptomatic.  3. HTN - her bp is well controlled. She is not symptomatic but if she were to develop any symptoms we would cut back her lopressor. 4. PAF - she is maintaining NSR. Her medtronic DDD PM is working normally.   Mikle Bosworth.D.

## 2020-07-07 NOTE — Patient Instructions (Signed)
Medication Instructions:  °Your physician recommends that you continue on your current medications as directed. Please refer to the Current Medication list given to you today. ° °Labwork: °None ordered. ° °Testing/Procedures: °None ordered. ° °Follow-Up: °Your physician wants you to follow-up in: one year with Dr. Taylor.   You will receive a reminder letter in the mail two months in advance. If you don't receive a letter, please call our office to schedule the follow-up appointment. ° °Remote monitoring is used to monitor your Pacemaker from home. This monitoring reduces the number of office visits required to check your device to one time per year. It allows us to keep an eye on the functioning of your device to ensure it is working properly. You are scheduled for a device check from home on 08/29/2020. You may send your transmission at any time that day. If you have a wireless device, the transmission will be sent automatically. After your physician reviews your transmission, you will receive a postcard with your next transmission date. ° °Any Other Special Instructions Will Be Listed Below (If Applicable). ° °If you need a refill on your cardiac medications before your next appointment, please call your pharmacy.  ° °

## 2020-07-21 ENCOUNTER — Other Ambulatory Visit: Payer: Self-pay | Admitting: Internal Medicine

## 2020-07-25 ENCOUNTER — Other Ambulatory Visit: Payer: Self-pay | Admitting: Internal Medicine

## 2020-07-31 ENCOUNTER — Other Ambulatory Visit: Payer: Self-pay

## 2020-07-31 MED ORDER — BUTALBITAL-APAP-CAFFEINE 50-325-40 MG PO TABS: ORAL_TABLET | ORAL | 1 refills | Status: DC

## 2020-07-31 NOTE — Telephone Encounter (Signed)
This was refilled on 07/24/2020 but didn't go electronically. Can we resend it.

## 2020-08-01 ENCOUNTER — Telehealth: Payer: Self-pay | Admitting: Internal Medicine

## 2020-08-01 ENCOUNTER — Other Ambulatory Visit: Payer: Self-pay | Admitting: Internal Medicine

## 2020-08-01 DIAGNOSIS — M810 Age-related osteoporosis without current pathological fracture: Secondary | ICD-10-CM

## 2020-08-01 DIAGNOSIS — Z1231 Encounter for screening mammogram for malignant neoplasm of breast: Secondary | ICD-10-CM

## 2020-08-01 MED ORDER — BUTALBITAL-APAP-CAFFEINE 50-325-40 MG PO TABS
ORAL_TABLET | ORAL | 1 refills | Status: DC
Start: 2020-08-01 — End: 2021-01-31

## 2020-08-01 NOTE — Telephone Encounter (Signed)
Rx has been canceled at [facility]. Pt is aware that rx has been sent in the [facility].

## 2020-08-01 NOTE — Telephone Encounter (Signed)
Dexa scan scan has been ordered and pt is aware that she can call to schedule at [facility].   While speaking with pt she stated that the Fioricet needs to be sent to [facility] not [facility]. Pt stated that this is only medication that she gets filled at [facility] everything else is [facility].

## 2020-08-01 NOTE — Telephone Encounter (Signed)
fioricet resent to [facility]> please cancel at [facility]

## 2020-08-01 NOTE — Telephone Encounter (Signed)
Patient needs a referral to [facility] for a bone density test.

## 2020-08-25 ENCOUNTER — Other Ambulatory Visit: Payer: Self-pay

## 2020-08-25 ENCOUNTER — Other Ambulatory Visit: Payer: Self-pay | Admitting: Cardiovascular Disease

## 2020-08-25 ENCOUNTER — Ambulatory Visit (INDEPENDENT_AMBULATORY_CARE_PROVIDER_SITE_OTHER): Payer: Medicare Other

## 2020-08-25 DIAGNOSIS — Z23 Encounter for immunization: Secondary | ICD-10-CM | POA: Diagnosis not present

## 2020-08-26 ENCOUNTER — Other Ambulatory Visit: Payer: Self-pay | Admitting: Internal Medicine

## 2020-08-28 NOTE — Telephone Encounter (Signed)
Pt's age 79, wt 64.9 kg, SCr 0.95, CrCl 49.2, last ov w/ GT 07/07/20.

## 2020-08-29 ENCOUNTER — Ambulatory Visit (INDEPENDENT_AMBULATORY_CARE_PROVIDER_SITE_OTHER): Payer: Medicare Other | Admitting: Emergency Medicine

## 2020-08-29 DIAGNOSIS — I442 Atrioventricular block, complete: Secondary | ICD-10-CM | POA: Diagnosis not present

## 2020-08-30 LAB — CUP PACEART REMOTE DEVICE CHECK
Battery Impedance: 479 Ohm
Battery Remaining Longevity: 76 mo
Battery Voltage: 2.79 V
Brady Statistic AP VP Percent: 98 %
Brady Statistic AP VS Percent: 0 %
Brady Statistic AS VP Percent: 2 %
Brady Statistic AS VS Percent: 0 %
Date Time Interrogation Session: 20210927085106
Implantable Lead Implant Date: 20161205
Implantable Lead Implant Date: 20170321
Implantable Lead Location: 753859
Implantable Lead Location: 753860
Implantable Lead Model: 5076
Implantable Lead Model: 5076
Implantable Pulse Generator Implant Date: 20161205
Lead Channel Impedance Value: 428 Ohm
Lead Channel Impedance Value: 783 Ohm
Lead Channel Pacing Threshold Amplitude: 0.625 V
Lead Channel Pacing Threshold Amplitude: 0.625 V
Lead Channel Pacing Threshold Pulse Width: 0.4 ms
Lead Channel Pacing Threshold Pulse Width: 0.4 ms
Lead Channel Setting Pacing Amplitude: 2 V
Lead Channel Setting Pacing Amplitude: 2.5 V
Lead Channel Setting Pacing Pulse Width: 0.4 ms
Lead Channel Setting Sensing Sensitivity: 4 mV

## 2020-09-01 NOTE — Progress Notes (Signed)
Remote pacemaker transmission.   

## 2020-09-04 LAB — HM DIABETES EYE EXAM

## 2020-09-12 ENCOUNTER — Ambulatory Visit
Admission: RE | Admit: 2020-09-12 | Discharge: 2020-09-12 | Disposition: A | Discharge status: Home / Self Care | Payer: Medicare Other | Source: Ambulatory Visit | Attending: Internal Medicine | Admitting: Internal Medicine

## 2020-09-12 ENCOUNTER — Other Ambulatory Visit: Payer: Self-pay

## 2020-09-12 DIAGNOSIS — Z1231 Encounter for screening mammogram for malignant neoplasm of breast: Secondary | ICD-10-CM | POA: Diagnosis present

## 2020-09-12 DIAGNOSIS — M810 Age-related osteoporosis without current pathological fracture: Secondary | ICD-10-CM | POA: Insufficient documentation

## 2020-10-03 ENCOUNTER — Telehealth: Payer: Self-pay | Admitting: Cardiovascular Disease

## 2020-10-03 NOTE — Telephone Encounter (Signed)
Attempted to schedule.  LMOV to call office.  ° °

## 2020-10-03 NOTE — Telephone Encounter (Signed)
-----   Message from Lamar Laundry, RN sent at 10/02/2020  9:26 AM EDT -----  ----- Message ----- From: Thompson Grayer, RN Sent: 10/02/2020   9:14 AM EDT To: Rebeca Alert [facility] Triage   ----- Message ----- From: Thayer Headings, MD Sent: 10/02/2020   8:21 AM EDT To: Minna Merritts, MD, [facility] Triage  Good morning.  [name] [name] would like to see Dr. Rockey Situ in the [place] office. See Pt advice request note in chart. This change is ok with me  Thanks  Abbe Amsterdam

## 2020-10-19 ENCOUNTER — Ambulatory Visit (INDEPENDENT_AMBULATORY_CARE_PROVIDER_SITE_OTHER): Payer: Medicare Other

## 2020-10-19 ENCOUNTER — Other Ambulatory Visit: Payer: Self-pay

## 2020-10-19 DIAGNOSIS — M810 Age-related osteoporosis without current pathological fracture: Secondary | ICD-10-CM

## 2020-10-19 MED ORDER — DENOSUMAB 60 MG/ML ~~LOC~~ SOSY
60.0000 mg | PREFILLED_SYRINGE | Freq: Once | SUBCUTANEOUS | Status: AC
  Administered 2020-10-19: 60 mg via SUBCUTANEOUS

## 2020-10-19 NOTE — Progress Notes (Signed)
Patient presented for 6-month Prolia injection SQ to left arm. Patient tolerated well. 

## 2020-10-24 ENCOUNTER — Encounter: Payer: Self-pay | Admitting: Cardiovascular Disease

## 2020-10-24 ENCOUNTER — Ambulatory Visit (INDEPENDENT_AMBULATORY_CARE_PROVIDER_SITE_OTHER): Payer: Medicare Other | Admitting: Cardiovascular Disease

## 2020-10-24 ENCOUNTER — Other Ambulatory Visit: Payer: Self-pay

## 2020-10-24 VITALS — BP 110/60 | HR 76 | Ht 65.5 in | Wt 144.0 lb

## 2020-10-24 DIAGNOSIS — I495 Sick sinus syndrome: Secondary | ICD-10-CM | POA: Diagnosis not present

## 2020-10-24 DIAGNOSIS — I341 Nonrheumatic mitral (valve) prolapse: Secondary | ICD-10-CM

## 2020-10-24 DIAGNOSIS — I700 Atherosclerosis of aorta: Secondary | ICD-10-CM | POA: Diagnosis not present

## 2020-10-24 DIAGNOSIS — I4819 Other persistent atrial fibrillation: Secondary | ICD-10-CM

## 2020-10-24 DIAGNOSIS — I6523 Occlusion and stenosis of bilateral carotid arteries: Secondary | ICD-10-CM | POA: Diagnosis not present

## 2020-10-24 NOTE — Patient Instructions (Addendum)
Medication Instructions:  No changes  Refill metoprolol  If you need a refill on your cardiac medications before your next appointment, please call your pharmacy.    Lab work: No new labs needed   If you have labs (blood work) drawn today and your tests are completely normal, you will receive your results only by: Marland Kitchen MyChart Message (if you have MyChart) OR . A paper copy in the mail If you have any lab test that is abnormal or we need to change your treatment, we will call you to review the results.   Testing/Procedures: Echo for atrial fib and mitral valve replacement Your physician has requested that you have an echocardiogram. Echocardiography is a painless test that uses sound waves to create images of your heart. It provides your doctor with information about the size and shape of your heart and how well your heart's chambers and valves are working. This procedure takes approximately one hour. There are no restrictions for this procedure.  There is a possibility that an IV may need to be started during your test to inject an image enhancing agent. This is done to obtain more optimal pictures of your heart. Therefore we ask that you do at least drink some water prior to coming in to hydrate your veins.     Follow-Up: At [facility], you and your health needs are our priority.  As part of our continuing mission to provide you with exceptional heart care, we have created designated Provider Care Teams.  These Care Teams include your primary Cardiologist (physician) and Advanced Practice Providers (APPs -  Physician Assistants and Nurse Practitioners) who all work together to provide you with the care you need, when you need it.  . You will need a follow up appointment in 6 months  . Providers on your designated Care Team:   . Murray Hodgkins, NP . Christell Faith, PA-C . Marrianne Mood, PA-C  Any Other Special Instructions Will Be Listed Below (If Applicable).  COVID-19  Vaccine Information can be found at: [email or web address].uk For questions related to vaccine distribution or appointments, please email [email or web address] or call [phone].     Echocardiogram An echocardiogram is a procedure that uses painless sound waves (ultrasound) to produce an image of the heart. Images from an echocardiogram can provide important information about:  Signs of coronary artery disease (CAD).  Aneurysm detection. An aneurysm is a weak or damaged part of an artery wall that bulges out from the normal force of blood pumping through the body.  Heart size and shape. Changes in the size or shape of the heart can be associated with certain conditions, including heart failure, aneurysm, and CAD.  Heart muscle function.  Heart valve function.  Signs of a past heart attack.  Fluid buildup around the heart.  Thickening of the heart muscle.  A tumor or infectious growth around the heart valves. Tell a health care provider about:  Any allergies you have.  All medicines you are taking, including vitamins, herbs, eye drops, creams, and over-the-counter medicines.  Any blood disorders you have.  Any surgeries you have had.  Any medical conditions you have.  Whether you are pregnant or may be pregnant. What are the risks? Generally, this is a safe procedure. However, problems may occur, including:  Allergic reaction to dye (contrast) that may be used during the procedure. What happens before the procedure? No specific preparation is needed. You may eat and drink normally. What happens during the procedure?   An IV  tube may be inserted into one of your veins.  You may receive contrast through this tube. A contrast is an injection that improves the quality of the pictures from your heart.  A gel will be applied to your chest.  A wand-like tool (transducer) will be moved over your chest. The gel will  help to transmit the sound waves from the transducer.  The sound waves will harmlessly bounce off of your heart to allow the heart images to be captured in real-time motion. The images will be recorded on a computer. The procedure may vary among health care providers and hospitals. What happens after the procedure?  You may return to your normal, everyday life, including diet, activities, and medicines, unless your health care provider tells you not to do that. Summary  An echocardiogram is a procedure that uses painless sound waves (ultrasound) to produce an image of the heart.  Images from an echocardiogram can provide important information about the size and shape of your heart, heart muscle function, heart valve function, and fluid buildup around your heart.  You do not need to do anything to prepare before this procedure. You may eat and drink normally.  After the echocardiogram is completed, you may return to your normal, everyday life, unless your health care provider tells you not to do that. This information is not intended to replace advice given to you by your health care provider. Make sure you discuss any questions you have with your health care provider. Document Revised: 03/11/2019 Document Reviewed: 12/21/2016 Elsevier Patient Education  [place].

## 2020-10-24 NOTE — Progress Notes (Signed)
Cardiology Office Note  Date:  10/24/2020   ID:  [name] [name], [name] [name] 04/25/1941, MRN [id number]  PCP:  [name], MD   Chief Complaint  Patient presents with  . Other    Follow up - Patient is transferred care from [facility]. Meds reviewed verbally with patient.     HPI:  [name] [name] is a 79 yo woman with past medical history of Supraventricular tachycardia, prior ablation Mitral valve prolapse - s/p mitral valve replacement  Paroxysmal Afib, on eliquis Cardiac cath 2016: no CAD MVR, maze 2017, dr. 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 Manuella Ghazi, for seizures Doing well on lamictal BID, briviact BID When she has seizure symptoms, she develops left foot gets paralysis lasting 4 min, felt the seizure activity comes from her temporal lobe  Prior cardiac imaging reviewed Echo 2018: ejection fraction was in the range of 40% to 45%.  Hypokinesis of the anteroseptal, inferior, and inferoseptal myocardium.  Mitral: bioprosthesis was present and functioning normally Left atrium: The atrium was severely dilated.  Systolic pressure was mildly increased. PApeak pressure: 40 mm Hg (S).   Carotid u/s 40 to 59% b;l disease  Labs reviewed: Normal BMP, CBC HBA1C 5.5  Pacer downloads: Atrial fib burden: 0.1 %, short runs Followed by Dr. Lovena Le   EKG personally reviewed by myself on todays visit Paced rhythm rate 76 bpm    PMH:   has a past medical history of Acute on chronic diastolic (congestive) heart failure ([place]), Allergy, Anemia (2018), Anxiety, Anxiety associated with depression, Anxiety associated with depression, Arthritis, Atrial fibrillation, persistent ([place]), Bradycardia (post-op bradycardia, pacer dependent), Cataract, Diverticulosis, Focal seizures ([place]), Heart murmur, Hemorrhoid, Hepatic cyst, History of asbestos  exposure, Hyperlipidemia, IBS (irritable bowel syndrome), Idiopathic thrombocytopenic purpura (ITP) ([place]), Migraine, MVP (mitral valve prolapse), Nodule of right lung, Osteoporosis of forearm, RBBB, Restrictive lung disease, S/P Minimally invasive maze operation for atrial fibrillation (10/31/2015), 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  PSH:    Past Surgical History:  Procedure Laterality Date  . BREAST EXCISIONAL BIOPSY Right Late 80s   Negative X2  . CARDIAC CATHETERIZATION N/A 10/18/2015   Procedure: Right/Left Heart Cath and Coronary Angiography;  Surgeon: Sherren Mocha, MD;  Location: [place] [facility];  Service: Cardiovascular;  Laterality: N/A;  . CARDIOVERSION N/A 08/22/2015   Procedure: CARDIOVERSION;  Surgeon: Thayer Headings, MD;  Location: [place];  Service: Cardiovascular;  Laterality: N/A;  . COLONOSCOPY  2003  . EP IMPLANTABLE DEVICE N/A 11/06/2015   Procedure: Pacemaker Implant;  Surgeon: Evans Lance, MD;  Location: [place] [facility];  Service: Cardiovascular;  Laterality: N/A;  . EP IMPLANTABLE DEVICE N/A 02/20/2016   Procedure: Lead Extraction;  Surgeon: Evans Lance, MD;  Location: [place] [facility];  Service: Cardiovascular;  Laterality: N/A;  . EYE SURGERY Right 2013  . FOOT SURGERY    . MANDIBLE FRACTURE SURGERY  03-26-13  . MINIMALLY INVASIVE MAZE PROCEDURE N/A 10/31/2015   Procedure: MINIMALLY INVASIVE MAZE PROCEDURE;  Surgeon: Rexene Alberts, MD;  Location: [place];  Service: Open Heart Surgery;  Laterality: N/A;  . MITRAL VALVE REPLACEMENT Right 10/31/2015   Procedure: MINIMALLY INVASIVE MITRAL VALVE (MV) REPLACEMENT;  Surgeon: Rexene Alberts, MD;  Location: [place];  Service: Open Heart Surgery;  Laterality: Right;  . PACEMAKER LEAD  REMOVAL  02/20/2016  . TEE WITH CARDIOVERSION    . TEE WITHOUT CARDIOVERSION N/A 08/22/2015   Procedure: TRANSESOPHAGEAL ECHOCARDIOGRAM (TEE);  Surgeon: Thayer Headings, MD;  Location: [place];  Service: Cardiovascular;  Laterality: N/A;  . TEE WITHOUT CARDIOVERSION N/A 10/31/2015   Procedure: TRANSESOPHAGEAL ECHOCARDIOGRAM (TEE);  Surgeon: Rexene Alberts, MD;  Location: [place];  Service: Open Heart Surgery;  Laterality: N/A;  . TUBAL LIGATION    . VARICOSE VEIN SURGERY Right     Current Outpatient Medications  Medication Sig Dispense Refill  . amoxicillin (AMOXIL) 500 MG tablet Take 4 tablets 60 minutes prior to dental procedures 30 tablet 6  . Ascorbic Acid (VITAMIN C) 1000 MG tablet Take 1 tablet by mouth daily.     Marland Kitchen azelastine (ASTELIN) 0.1 % nasal spray Place 2 sprays into both nostrils 2 (two) times daily. Use in each nostril as directed 30 mL 12  . b complex vitamins tablet Take 1 tablet by mouth daily.    Marland Kitchen BRIVIACT 25 MG TABS tablet Take one tablet (25MG ) by mouth in the AM and two tablets (50MG ) by mouth in the PM.    . butalbital-acetaminophen-caffeine (FIORICET) 50-325-40 MG tablet TAKE ONE TABLET BY MOUTH DAILY AS NEEDED FOR BACK PAIN OR MIGRAINE HEADEACHE - MAXIMUM OF 3 TABLETS DAILY 90 tablet 1  . Cholecalciferol (VITAMIN D) 2000 units CAPS Take 2,000 Units by mouth daily.    Marland Kitchen ELIQUIS 5 MG TABS tablet TAKE ONE TABLET TWICE DAILY 180 tablet 1  . fluconazole (DIFLUCAN) 150 MG tablet Take 150 mg by mouth daily as needed.    . fluticasone (FLONASE) 50 MCG/ACT nasal spray USE 2 SPRAYS IN BOTH NOSTRILS DAILY 16 g 11  . folic acid (FOLVITE) 631 MCG tablet Take 800 mcg by mouth daily.     . furosemide (LASIX) 40 MG tablet Take 1 tablet (40 mg total) by mouth as directed. 30 tablet 11  . hydroquinone 4 % cream     . lamoTRIgine (LAMICTAL) 150 MG tablet Take 150 mg by mouth 2 (two) times daily.    . metoprolol tartrate (LOPRESSOR) 25 MG tablet TAKE 1/2 TABLET TWICE A DAY 90  tablet 0  . montelukast (SINGULAIR) 10 MG tablet TAKE ONE TABLET BY MOUTH AT BEDTIME 90 tablet 1  . polyethylene glycol powder (MIRALAX) 17 GM/SCOOP powder Take 1 Dose by mouth daily.    . rosuvastatin (CRESTOR) 40 MG tablet TAKE ONE TABLET EVERY DAY 90 tablet 1  . triamcinolone cream (KENALOG) 0.1 % Apply 1 application topically 2 (two) times daily. 30 g 0  . VENTOLIN HFA 108 (90 Base) MCG/ACT inhaler      No current facility-administered medications for this visit.     Allergies:   Epinephrine, Meloxicam, Codeine, Dextromethorphan, Diphenhydramine, Diphenhydramine hcl, Doxycycline, Hydrocodone, Hydromorphone, Oxycodone, and Propofol   Social History:  The patient  reports that she has never smoked. She has never used smokeless tobacco. She reports that she does not drink alcohol and does not use drugs.   Family History:   family history includes Arrhythmia in her brother and mother; Atrial fibrillation in her son; Celiac disease in her son; Heart failure in her mother; Hypertension in her mother; Liver cancer in her maternal grandmother; Prostate cancer in her brother; Stroke in her father; Stroke (age of onset: 61) in her brother; Stroke (age of  onset: 41) in her maternal aunt.    Review of Systems: Review of Systems  Constitutional: Negative.   HENT: Negative.   Respiratory: Negative.   Cardiovascular: Negative.   Gastrointestinal: Negative.   Musculoskeletal: Negative.   Neurological: Negative.   Psychiatric/Behavioral: Negative.   All other systems reviewed and are negative.   PHYSICAL EXAM: VS:  BP 110/60 (BP Location: Left Arm, Patient Position: Sitting, Cuff Size: Normal)   Pulse 76   Ht 5' 5.5" (1.664 m)   Wt 144 lb (65.3 kg)   BMI 23.60 kg/m  , BMI Body mass index is 23.6 kg/m. GEN: Well nourished, well developed, in no acute distress HEENT: normal Neck: no JVD, carotid bruits, or masses Cardiac: RRR; no murmurs, rubs, or gallops,no edema  Respiratory:  clear to  auscultation bilaterally, normal work of breathing GI: soft, nontender, nondistended, + BS [name]: no deformity or atrophy Skin: warm and dry, no rash Neuro:  Strength and sensation are intact Psych: euthymic mood, full affect    Recent Labs: 06/14/2020: ALT 13; BUN 26; Creatinine, Ser 0.95; Hemoglobin 11.1; Platelets 120.0; Potassium 4.1; Sodium 141    Lipid Panel Lab Results  Component Value Date   CHOL 143 05/21/2019   HDL 59.50 05/21/2019   LDLCALC 71 05/21/2019   TRIG 60.0 05/21/2019      Wt Readings from Last 3 Encounters:  10/24/20 144 lb (65.3 kg)  07/07/20 143 lb (64.9 kg)  06/14/20 141 lb 6.4 oz (64.1 kg)       ASSESSMENT AND PLAN:  Problem List Items Addressed This Visit      Cardiology Problems   Atrial fibrillation, persistent ([place]) - Primary   Relevant Orders   EKG 12-Lead   Sinus node dysfunction (HCC)   Relevant Orders   EKG 12-Lead   Thoracic aorta atherosclerosis (HCC) (Chronic)     PAF: Rare episodes noted on review of pacer downloads On eliquis, no significant bleeding or bruising Takes very low-dose metoprolol, limited secondary to hypotension Potentially could take extra half dose metoprolol for prolonged breakthrough episodes  SSS, pacer Followed by Dr. Lovena Le  SVT Prior ablation, Will continue low-dose beta-blocker  Seizures Followed by neurology, Appears to be relatively well controlled on current medication regimen  Hyperlipidemia Cholesterol is at goal on the current lipid regimen. No changes to the medications were made.  MVR, prior history of prolapse Repeat echocardiogram ordered, coming up on 4 years    Total encounter time more than 35 minutes  Greater than 50% was spent in counseling and coordination of care with the patient    Signed, Esmond Plants, M.D., Ph.D. [place], [place]

## 2020-10-25 ENCOUNTER — Encounter (HOSPITAL_COMMUNITY): Payer: Self-pay | Admitting: Nurse Practitioner

## 2020-10-25 ENCOUNTER — Ambulatory Visit (HOSPITAL_COMMUNITY)
Admission: RE | Admit: 2020-10-25 | Discharge: 2020-10-25 | Disposition: A | Discharge status: Home / Self Care | Payer: Medicare Other | Source: Ambulatory Visit | Attending: Nurse Practitioner | Admitting: Nurse Practitioner

## 2020-10-25 VITALS — BP 120/56 | HR 72 | Ht 65.5 in | Wt 145.2 lb

## 2020-10-25 DIAGNOSIS — Z79899 Other long term (current) drug therapy: Secondary | ICD-10-CM | POA: Diagnosis not present

## 2020-10-25 DIAGNOSIS — I340 Nonrheumatic mitral (valve) insufficiency: Secondary | ICD-10-CM | POA: Diagnosis not present

## 2020-10-25 DIAGNOSIS — I4819 Other persistent atrial fibrillation: Secondary | ICD-10-CM

## 2020-10-25 DIAGNOSIS — I4891 Unspecified atrial fibrillation: Secondary | ICD-10-CM | POA: Diagnosis not present

## 2020-10-25 DIAGNOSIS — Z888 Allergy status to other drugs, medicaments and biological substances status: Secondary | ICD-10-CM | POA: Insufficient documentation

## 2020-10-25 DIAGNOSIS — D6869 Other thrombophilia: Secondary | ICD-10-CM | POA: Diagnosis not present

## 2020-10-25 DIAGNOSIS — Z885 Allergy status to narcotic agent status: Secondary | ICD-10-CM | POA: Diagnosis not present

## 2020-10-25 DIAGNOSIS — Z950 Presence of cardiac pacemaker: Secondary | ICD-10-CM | POA: Insufficient documentation

## 2020-10-25 DIAGNOSIS — Z8249 Family history of ischemic heart disease and other diseases of the circulatory system: Secondary | ICD-10-CM | POA: Diagnosis not present

## 2020-10-25 DIAGNOSIS — I5020 Unspecified systolic (congestive) heart failure: Secondary | ICD-10-CM | POA: Insufficient documentation

## 2020-10-25 DIAGNOSIS — Z7901 Long term (current) use of anticoagulants: Secondary | ICD-10-CM | POA: Diagnosis not present

## 2020-10-25 DIAGNOSIS — Z952 Presence of prosthetic heart valve: Secondary | ICD-10-CM | POA: Diagnosis not present

## 2020-10-25 NOTE — Progress Notes (Signed)
Primary Care Physician: [name], MD Referring Physician: Dr. Roxy Manns Cardiologist: Dr. Roxy Manns EP: Dr. Deberah Pelton is a 79 y.o. 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 So far, she has been maintaining  SR since the maze.  F/u in afib clinic,11/24. She reports that she feels well. She feels some palpitations occasionally but nothing sustained. Last  paceart report shows low afib burden. No shortness of breath.    Today, she denies symptoms of palpitations, chest pain, shortness of breath, orthopnea, PND, lower extremity edema, dizziness, presyncope, syncope, or neurologic sequela. The patient is tolerating medications without difficulties and is otherwise without complaint today.   Past Medical History:  Diagnosis Date  . Acute on chronic diastolic (congestive) heart failure ([place])   . Allergy    See list  . Anemia 2018  . Anxiety    Occasionally take Xanax for sleep  . Anxiety associated with depression    Prn alprazolam   . Anxiety associated with depression   . Arthritis    Hands, Back  . Atrial fibrillation, persistent ([place])    DCCV 08/22/2015  . Bradycardia post-op bradycardia, pacer dependent   MDT PPM 11/06/15, Dr. Lovena Le  . Cataract    Left eye  . Diverticulosis   . Focal seizures ([place])   . Heart murmur   . Hemorrhoid   . Hepatic cyst    innumerable  . 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 Hyperlipidemia   . IBS (irritable bowel syndrome)   . 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 Migraine   . MVP (mitral valve prolapse)   . Nodule of right  lung   . Osteoporosis of forearm   . RBBB   . Restrictive lung disease    Mild on PFT & likely cardiac in etiology   . S/P Minimally invasive maze operation for atrial fibrillation 10/31/2015   Complete bilateral atrial lesion set using cryothermy and bipolar radiofrequency ablation with clipping of LA appendage via right mini thoracotomy approach  . S/P minimally invasive mitral valve replacement with bioprosthetic valve 10/31/2015   33 mm [facility] Mitral bovine bioprosthetic tissue valve placed via right mini thoracotomy approach  . Severe mitral regurgitation   . 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 SVT (supraventricular tachycardia) ([place])   . Thoracic aorta atherosclerosis ([place])   . TIA (transient ischemic attack)    Past Surgical History:  Procedure Laterality Date  . BREAST EXCISIONAL BIOPSY Right Late 80s   Negative X2  . CARDIAC CATHETERIZATION N/A 10/18/2015   Procedure: Right/Left Heart Cath and Coronary Angiography;  Surgeon: Sherren Mocha, MD;  Location: [place] [facility];  Service: Cardiovascular;  Laterality: N/A;  . CARDIOVERSION N/A 08/22/2015   Procedure: CARDIOVERSION;  Surgeon: Thayer Headings, MD;  Location: [place] [facility];  Service: Cardiovascular;  Laterality: N/A;  . COLONOSCOPY  2003  . EP IMPLANTABLE DEVICE N/A 11/06/2015   Procedure: Pacemaker Implant;  Surgeon: Evans Lance, MD;  Location: [place] [facility];  Service: Cardiovascular;  Laterality: N/A;  . EP IMPLANTABLE DEVICE N/A 02/20/2016  Procedure: Lead Extraction;  Surgeon: Evans Lance, MD;  Location: [place] [facility];  Service: Cardiovascular;  Laterality: N/A;  . EYE SURGERY Right 2013  . FOOT SURGERY    . MANDIBLE FRACTURE SURGERY  03-26-13  . MINIMALLY INVASIVE MAZE PROCEDURE N/A 10/31/2015   Procedure: MINIMALLY INVASIVE MAZE PROCEDURE;  Surgeon: Rexene Alberts, MD;  Location: [place];  Service: Open Heart Surgery;  Laterality: N/A;  . MITRAL VALVE REPLACEMENT Right  10/31/2015   Procedure: MINIMALLY INVASIVE MITRAL VALVE (MV) REPLACEMENT;  Surgeon: Rexene Alberts, MD;  Location: [place];  Service: Open Heart Surgery;  Laterality: Right;  . PACEMAKER LEAD REMOVAL  02/20/2016  . TEE WITH CARDIOVERSION    . TEE WITHOUT CARDIOVERSION N/A 08/22/2015   Procedure: TRANSESOPHAGEAL ECHOCARDIOGRAM (TEE);  Surgeon: Thayer Headings, MD;  Location: [place];  Service: Cardiovascular;  Laterality: N/A;  . TEE WITHOUT CARDIOVERSION N/A 10/31/2015   Procedure: TRANSESOPHAGEAL ECHOCARDIOGRAM (TEE);  Surgeon: Rexene Alberts, MD;  Location: [facility];  Service: Open Heart Surgery;  Laterality: N/A;  . TUBAL LIGATION    . VARICOSE VEIN SURGERY Right     Current Outpatient Medications  Medication Sig Dispense Refill  . amoxicillin (AMOXIL) 500 MG tablet Take 4 tablets 60 minutes prior to dental procedures 30 tablet 6  . Ascorbic Acid (VITAMIN C) 1000 MG tablet Take 1 tablet by mouth daily.     Marland Kitchen azelastine (ASTELIN) 0.1 % nasal spray Place 2 sprays into both nostrils 2 (two) times daily. Use in each nostril as directed 30 mL 12  . b complex vitamins tablet Take 1 tablet by mouth daily.    Marland Kitchen BRIVIACT 25 MG TABS tablet Take one tablet (25MG ) by mouth in the AM and two tablets (50MG ) by mouth in the PM.    . butalbital-acetaminophen-caffeine (FIORICET) 50-325-40 MG tablet TAKE ONE TABLET BY MOUTH DAILY AS NEEDED FOR BACK PAIN OR MIGRAINE HEADEACHE - MAXIMUM OF 3 TABLETS DAILY 90 tablet 1  . Cholecalciferol (VITAMIN D) 2000 units CAPS Take 2,000 Units by mouth daily.    Marland Kitchen ELIQUIS 5 MG TABS tablet TAKE ONE TABLET TWICE DAILY 180 tablet 1  . fluconazole (DIFLUCAN) 150 MG tablet Take 150 mg by mouth daily as needed.    . fluticasone (FLONASE) 50 MCG/ACT nasal spray USE 2 SPRAYS IN BOTH NOSTRILS DAILY 16 g 11  . folic acid (FOLVITE) 099 MCG tablet Take 800 mcg by mouth daily.     . furosemide (LASIX) 40 MG tablet Take 1 tablet (40 mg total) by mouth as directed. (Patient taking  differently: Take 40 mg by mouth as needed. ) 30 tablet 11  . hydroquinone 4 % cream Apply topically in the morning and at bedtime.     . lamoTRIgine (LAMICTAL) 150 MG tablet Take 150 mg by mouth 2 (two) times daily.    . metoprolol tartrate (LOPRESSOR) 25 MG tablet TAKE 1/2 TABLET TWICE A DAY 90 tablet 0  . montelukast (SINGULAIR) 10 MG tablet TAKE ONE TABLET BY MOUTH AT BEDTIME 90 tablet 1  . polyethylene glycol powder (MIRALAX) 17 GM/SCOOP powder Take 1 Dose by mouth daily.    . rosuvastatin (CRESTOR) 40 MG tablet TAKE ONE TABLET EVERY DAY 90 tablet 1  . triamcinolone cream (KENALOG) 0.1 % Apply 1 application topically 2 (two) times daily. (Patient taking differently: Apply 1 application topically as needed. ) 30 g 0  . VENTOLIN HFA 108 (90 Base) MCG/ACT inhaler Inhale 2 puffs into the lungs  as needed.      No current facility-administered medications for this encounter.    Allergies  Allergen Reactions  . Epinephrine Palpitations  . Meloxicam Other (See Comments)    Severe reflux  . Codeine Nausea And Vomiting    migraine  . Dextromethorphan     seizures  . Diphenhydramine Other (See Comments)    seizure  . Diphenhydramine Hcl     Other reaction(s): Other (See Comments)  . Doxycycline Itching and Swelling    Facial  . Hydrocodone Nausea And Vomiting    MIGRAINE  . Hydromorphone Nausea And Vomiting    migraine  . Oxycodone Nausea And Vomiting    Severe migraine  . Propofol Other (See Comments)    Social History   Socioeconomic History  . Marital status: Widowed    Spouse name: Deceased  . Number of children: 2  . Years of education: 75  . Highest education level: Not on file  Occupational History  . Occupation: Retired  Tobacco Use  . Smoking status: Never Smoker  . Smokeless tobacco: Never Used  Vaping Use  . Vaping Use: Never used  Substance and Sexual Activity  . Alcohol use: No    Alcohol/week: 3.0 standard drinks    Types: 3 Standard drinks or equivalent  per week  . Drug use: No  . Sexual activity: Never  Other Topics Concern  . Not on file  Social History Narrative   She is a widow.  Husband died from metastatic renal cell cancer. Originally from [place]. Previously lived in [place] from 1975-[place]. Moved to [place] in 2007. No mold exposure recently but did have it through a prior work exposure in 1993. Has a masters in public health. No bird exposure.      [facility] (09/29/17):   She has moved into a retirement community since last appointment. She reports she had testing at her new residence that was positive for mold. It has since been treated.    Social Determinants of Health   Financial Resource Strain:   . Difficulty of Paying Living Expenses: Not on file  Food Insecurity:   . Worried About Charity fundraiser in the Last Year: Not on file  . Ran Out of Food in the Last Year: Not on file  Transportation Needs:   . Lack of Transportation (Medical): Not on file  . Lack of Transportation (Non-Medical): Not on file  Physical Activity:   . Days of Exercise per Week: Not on file  . Minutes of Exercise per Session: Not on file  Stress:   . Feeling of Stress : Not on file  Social Connections:   . Frequency of Communication with Friends and Family: Not on file  . Frequency of Social Gatherings with Friends and Family: Not on file  . Attends Religious Services: Not on file  . Active Member of Clubs or Organizations: Not on file  . Attends Archivist Meetings: Not on file  . Marital Status: Not on file  Intimate Partner Violence:   . Fear of Current or Ex-Partner: Not on file  . Emotionally Abused: Not on file  . Physically Abused: Not on file  . Sexually Abused: Not on file    Family History  Problem Relation Age of Onset  . Hypertension Mother   . Arrhythmia Mother   . Heart failure Mother   . Arrhythmia Brother   . Stroke Brother 56       cerebral hemorrhage, nonsmoker, no HTN  .  Prostate cancer Brother    . Stroke Father        from an aneurysm  . Stroke Maternal Aunt 83       cerebral hemorrhage  . Liver cancer Maternal Grandmother   . Atrial fibrillation Son   . Celiac disease Son   . Heart attack Neg Hx   . Breast cancer Neg Hx     ROS- All systems are reviewed and negative except as per the HPI above  Physical Exam: Vitals:   10/25/20 1335  BP: (!) 120/56  Pulse: 72  Weight: 65.9 kg  Height: 5' 5.5" (1.664 m)   Wt Readings from Last 3 Encounters:  10/25/20 65.9 kg  10/24/20 65.3 kg  07/07/20 64.9 kg    Labs: Lab Results  Component Value Date   NA 141 06/14/2020   K 4.1 06/14/2020   CL 103 06/14/2020   CO2 31 06/14/2020   GLUCOSE 119 (H) 06/14/2020   BUN 26 (H) 06/14/2020   CREATININE 0.95 06/14/2020   CALCIUM 9.6 06/14/2020   PHOS 3.2 02/11/2020   MG 2.2 05/21/2019   Lab Results  Component Value Date   INR 1.11 05/06/2017   Lab Results  Component Value Date   CHOL 143 05/21/2019   HDL 59.50 05/21/2019   LDLCALC 71 05/21/2019   TRIG 60.0 05/21/2019     GEN- The patient is well appearing, alert and oriented x 3 today.   Head- normocephalic, atraumatic Eyes-  Sclera clear, conjunctiva pink Ears- hearing intact Oropharynx- clear Neck- supple, no JVP Lymph- no cervical lymphadenopathy Lungs- Clear to ausculation bilaterally, normal work of breathing Heart- Regular rate and rhythm, no murmurs, rubs or gallops, PMI not laterally displaced GI- soft, NT, ND, + BS Extremities- no clubbing, cyanosis, or edema MS- no significant deformity or atrophy Skin- no rash or lesion Psych- euthymic mood, full affect Neuro- strength and sensation are intact  EKG- AV dual paced, at 76 bpm, pr int 188 ms, qrs int 180 ms, qtc 549 ms    Assessment and Plan: 1. S/p  severe mitral regurgitation and atrial fibrillation status post minimally invasive bioprosthetic mitral valve replacement and Maze procedure, clipping of LAA in 2016 Has been enjoying SR since the  procedure, very low afib burden  Continue BB without change Continue eliquis 5 mg bid for [id number] score of 4  2. PPM Per Dr. Lovena Le  F/u in one year  Geroge Baseman. Mayley Lish, [facility] [address] [place], [place] [address] [phone]

## 2020-11-03 ENCOUNTER — Other Ambulatory Visit: Payer: Self-pay | Admitting: Internal Medicine

## 2020-11-17 ENCOUNTER — Other Ambulatory Visit: Payer: Self-pay

## 2020-11-17 ENCOUNTER — Ambulatory Visit (INDEPENDENT_AMBULATORY_CARE_PROVIDER_SITE_OTHER): Payer: Medicare Other

## 2020-11-17 DIAGNOSIS — I341 Nonrheumatic mitral (valve) prolapse: Secondary | ICD-10-CM

## 2020-11-17 LAB — ECHOCARDIOGRAM COMPLETE
AR max vel: 1.73 cm2
AV Area VTI: 1.47 cm2
AV Area mean vel: 2.14 cm2
AV Mean grad: 2.5 mmHg
AV Peak grad: 5.7 mmHg
Ao pk vel: 1.19 m/s
Calc EF: 30.9 %
S' Lateral: 3.6 cm
Single Plane A2C EF: 23.5 %
Single Plane A4C EF: 33.9 %

## 2020-11-22 ENCOUNTER — Telehealth: Payer: Self-pay

## 2020-11-22 MED ORDER — METOPROLOL SUCCINATE ER 50 MG PO TB24: 25.0000 mg | ORAL_TABLET | Freq: Every day | ORAL | 3 refills | Status: DC

## 2020-11-22 MED ORDER — LOSARTAN POTASSIUM 25 MG PO TABS: 12.5000 mg | ORAL_TABLET | Freq: Every day | ORAL | 3 refills | Status: DC

## 2020-11-22 NOTE — Telephone Encounter (Signed)
Able to reach pt regarding her recent echocardiogram, Dr. Rockey Situ had a chance to review her results and advised   "Echo Prosthetic valve is stable There is continued depressed EF 35 to 40% as seen in 2017 Little room on blood pressure to make major changes, would consider changing to guideline therapy medications for cardiomyopathy,  Change metoprolol tartrate to metoprolol succinate 25 ng daily, Would also start losartan 12.5 mg daily"  Pt reports she had seen this advice by Dr. Rockey Situ that he had uploaded to her MyChart, pt verbalized understanding and is in agreeable with plan of care. Asked for medication to be sent to [facility], pt was edcuated to discard her metoprolol tartrate, so it is not to be confused with metoprolol succinate. Pt verbalized understanding.Pt will pick up newly order medications at her local pharmacy, otherwise all questions or concerns were address and no additional concerns at this time. Agreeable to plan, will call back for anything further.

## 2020-11-22 NOTE — Telephone Encounter (Signed)
-----   Message from Minna Merritts, MD sent at 11/22/2020 10:53 AM EST ----- Echo Prosthetic valve is stable There is continued depressed EF 35 to 40% as seen in 2017 Little room on blood pressure to make major changes, would consider changing to guideline therapy medications for cardiomyopathy,  Change metoprolol tartrate to metoprolol succinate 25 ng daily, Would also start losartan 12.5 mg daily

## 2020-11-28 ENCOUNTER — Ambulatory Visit (INDEPENDENT_AMBULATORY_CARE_PROVIDER_SITE_OTHER): Payer: Medicare Other

## 2020-11-28 DIAGNOSIS — I442 Atrioventricular block, complete: Secondary | ICD-10-CM

## 2020-11-29 LAB — CUP PACEART REMOTE DEVICE CHECK
Battery Impedance: 477 Ohm
Battery Remaining Longevity: 76 mo
Battery Voltage: 2.79 V
Brady Statistic AP VP Percent: 98 %
Brady Statistic AP VS Percent: 0 %
Brady Statistic AS VP Percent: 2 %
Brady Statistic AS VS Percent: 0 %
Date Time Interrogation Session: 20211228083002
Implantable Lead Implant Date: 20161205
Implantable Lead Implant Date: 20170321
Implantable Lead Location: 753859
Implantable Lead Location: 753860
Implantable Lead Model: 5076
Implantable Lead Model: 5076
Implantable Pulse Generator Implant Date: 20161205
Lead Channel Impedance Value: 417 Ohm
Lead Channel Impedance Value: 725 Ohm
Lead Channel Pacing Threshold Amplitude: 0.5 V
Lead Channel Pacing Threshold Amplitude: 0.5 V
Lead Channel Pacing Threshold Pulse Width: 0.4 ms
Lead Channel Pacing Threshold Pulse Width: 0.4 ms
Lead Channel Setting Pacing Amplitude: 2 V
Lead Channel Setting Pacing Amplitude: 2.5 V
Lead Channel Setting Pacing Pulse Width: 0.4 ms
Lead Channel Setting Sensing Sensitivity: 4 mV

## 2020-12-12 NOTE — Progress Notes (Signed)
Remote pacemaker transmission.   

## 2021-01-25 ENCOUNTER — Other Ambulatory Visit: Payer: Self-pay

## 2021-01-25 ENCOUNTER — Encounter: Payer: Self-pay | Admitting: Family

## 2021-01-25 ENCOUNTER — Ambulatory Visit (INDEPENDENT_AMBULATORY_CARE_PROVIDER_SITE_OTHER): Payer: Medicare Other | Admitting: Family

## 2021-01-25 VITALS — BP 118/76 | HR 79 | Ht 65.5 in | Wt 146.0 lb

## 2021-01-25 DIAGNOSIS — I4819 Other persistent atrial fibrillation: Secondary | ICD-10-CM | POA: Diagnosis not present

## 2021-01-25 DIAGNOSIS — I5022 Chronic systolic (congestive) heart failure: Secondary | ICD-10-CM

## 2021-01-25 DIAGNOSIS — Z950 Presence of cardiac pacemaker: Secondary | ICD-10-CM

## 2021-01-25 DIAGNOSIS — E782 Mixed hyperlipidemia: Secondary | ICD-10-CM

## 2021-01-25 DIAGNOSIS — Z952 Presence of prosthetic heart valve: Secondary | ICD-10-CM | POA: Diagnosis not present

## 2021-01-25 DIAGNOSIS — R609 Edema, unspecified: Secondary | ICD-10-CM | POA: Diagnosis not present

## 2021-01-25 MED ORDER — SPIRONOLACTONE 25 MG PO TABS: 12.5000 mg | ORAL_TABLET | Freq: Every day | ORAL | 2 refills | Status: DC

## 2021-01-25 NOTE — Progress Notes (Signed)
Office Visit    Patient Name: [name] [name] Date of Encounter: 01/25/2021  PCP:  [name], MD   [place]  Cardiologist:  Ida Rogue, MD  Advanced Practice Provider:  No care team member to display Electrophysiologist:  Cristopher Peru, MD   Chief Complaint    LABRESHA [name] is a 80 y.o. 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 Acute on chronic diastolic (congestive) heart failure ([place])   . Allergy    See list  . Anemia 2018  . Anxiety    Occasionally take Xanax for sleep  . Anxiety associated with depression    Prn alprazolam   . Anxiety associated with depression   . Arthritis    Hands, Back  . Atrial fibrillation, persistent ([place])    DCCV 08/22/2015  . Bradycardia post-op bradycardia, pacer dependent   MDT PPM 11/06/15, Dr. Lovena Le  . Cataract    Left eye  . Diverticulosis   . Focal seizures ([place])   . Heart murmur   . Hemorrhoid   . Hepatic cyst    innumerable  . 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 Hyperlipidemia   . IBS (irritable bowel syndrome)   . 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 Migraine   . MVP (mitral valve prolapse)   . Nodule of right lung   . Osteoporosis of forearm   . RBBB   . Restrictive lung disease    Mild on PFT & likely cardiac in etiology   . S/P Minimally invasive maze operation for atrial fibrillation 10/31/2015   Complete bilateral atrial lesion set using cryothermy and bipolar radiofrequency ablation with clipping of LA appendage via right mini thoracotomy approach  . S/P minimally invasive mitral valve replacement with bioprosthetic  valve 10/31/2015   33 mm [facility] Mitral bovine bioprosthetic tissue valve placed via right mini thoracotomy approach  . Severe mitral regurgitation   . 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 SVT (supraventricular tachycardia) ([place])   . Thoracic aorta atherosclerosis ([place])   . TIA (transient ischemic attack)    Past Surgical History:  Procedure Laterality Date  . BREAST EXCISIONAL BIOPSY Right Late 80s   Negative X2  . CARDIAC CATHETERIZATION N/A 10/18/2015   Procedure: Right/Left Heart Cath and Coronary Angiography;  Surgeon: Sherren Mocha, MD;  Location: [facility];  Service: Cardiovascular;  Laterality: N/A;  . CARDIOVERSION N/A 08/22/2015   Procedure: CARDIOVERSION;  Surgeon: Thayer Headings, MD;  Location: [facility];  Service: Cardiovascular;  Laterality: N/A;  . COLONOSCOPY  2003  . EP IMPLANTABLE DEVICE N/A 11/06/2015   Procedure: Pacemaker Implant;  Surgeon: Evans Lance, MD;  Location: [facility];  Service: Cardiovascular;  Laterality: N/A;  . EP IMPLANTABLE DEVICE N/A 02/20/2016   Procedure: Lead Extraction;  Surgeon: Evans Lance, MD;  Location: [facility];  Service: Cardiovascular;  Laterality: N/A;  . EYE SURGERY Right 2013  . FOOT SURGERY    . MANDIBLE FRACTURE SURGERY  03-26-13  . MINIMALLY INVASIVE MAZE PROCEDURE N/A 10/31/2015   Procedure: MINIMALLY  INVASIVE MAZE PROCEDURE;  Surgeon: Rexene Alberts, MD;  Location: [facility];  Service: Open Heart Surgery;  Laterality: N/A;  . MITRAL VALVE REPLACEMENT Right 10/31/2015   Procedure: MINIMALLY INVASIVE MITRAL VALVE (MV) REPLACEMENT;  Surgeon: Rexene Alberts, MD;  Location: [facility];  Service: Open Heart Surgery;  Laterality: Right;  . PACEMAKER LEAD REMOVAL  02/20/2016  . TEE WITH CARDIOVERSION    . TEE WITHOUT CARDIOVERSION N/A 08/22/2015   Procedure: TRANSESOPHAGEAL ECHOCARDIOGRAM (TEE);  Surgeon: Thayer Headings, MD;  Location: [facility];  Service: Cardiovascular;  Laterality:  N/A;  . TEE WITHOUT CARDIOVERSION N/A 10/31/2015   Procedure: TRANSESOPHAGEAL ECHOCARDIOGRAM (TEE);  Surgeon: Rexene Alberts, MD;  Location: [facility];  Service: Open Heart Surgery;  Laterality: N/A;  . TUBAL LIGATION    . VARICOSE VEIN SURGERY Right     Allergies  Allergies  Allergen Reactions  . Epinephrine Palpitations  . Meloxicam Other (See Comments)    Severe reflux  . Codeine Nausea And Vomiting    migraine  . Dextromethorphan     seizures  . Diphenhydramine Other (See Comments)    seizure  . Diphenhydramine Hcl     Other reaction(s): Other (See Comments)  . Doxycycline Itching and Swelling    Facial  . Hydrocodone Nausea And Vomiting    MIGRAINE  . Hydromorphone Nausea And Vomiting    migraine  . Oxycodone Nausea And Vomiting    Severe migraine  . Propofol Other (See Comments)    History of Present Illness    [name] [name] is a 80 y.o. 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 Rockey Situ.  Previous cardiac catheterization 10/2015 with no evidence of coronary artery disease. This was performed as part of workup for her MVR and MAZE performed by Dr. Roxy Manns in 2016.  Most resent 11/22/20 echocardiogram with LVEF 35-40% (stable compared to 2017), LV global hypokinesis, indeterminate LV diastolic parameters, RV normal size and function, normal PASP, stable bioprosthetic mitral valve. She was started on Losartan 12.5mg  QD and Metoprolol Tartrate transitioned to Succinate for optimization of HF therapy. She reported intolerance with difficulties in sleep though these symptoms resolved without changes to medical therapy.   She sent a MyChart message yesterday with concerns regarding "increaing CHF". She felt  she was retaining fluid, increasing activity intolerance. She reported gradual weight gain of weighing 137 pounds in 2017 and 144 now on her home scale. She was recommended for follow up appointment.   She presents today for follow up.   Notes she did not keep up with her weight, vitals, etc during 2021. She does not that Christmas Eve 2021 she felt increasingly thirsty after eating a large portion of lasagna which was high in salt. She took a Lasix tablet 40mg  that day then 20mg  the next day with relief.   Shares with me last Saturday 01/19/21 she drove to [place] me she went to walk up one flight of stairs and noted she had to "pull herself up" the stairs but got home and was very fatigued and took a long nap. Felt fatigued for 2 days, then better on Monday. However, Wednesday morning she noted a sensation of swelling in her hands, face, and feet. She took Lasix 40mg  yesterday "  knowing that it was too much". She is very concerned about her heart failure status and tells me she wishes to feel better. Endorses low sodium diet and <2L fluid restriction.    EKGs/Labs/Other Studies Reviewed:   The following studies were reviewed today:  Echo 11/17/20  1. Left ventricular ejection fraction, by estimation, is 35 to 40%. The  left ventricle has moderately decreased function. The left ventricle  demonstrates global hypokinesis. Left ventricular diastolic parameters are  indeterminate. The average left  ventricular global longitudinal strain is -6.7 %.   2. Right ventricular systolic function is normal. The right ventricular  size is normal. There is normal pulmonary artery systolic pressure. The  estimated right ventricular systolic pressure is 25.0 mmHg.   3. Left atrial size was mildly dilated.   4. The mitral valve has been repaired/replaced. No evidence of mitral  valve regurgitation. No evidence of mitral stenosis. The mean mitral valve  gradient is 2.0 mmHg. There is a bioprosthetic valve  present in the mitral  position. Procedure Date:  10/2015.   5. Tricuspid valve regurgitation is moderate.   EKG:  EKG is ordered today.  The ekg ordered today demonstrates AV paced rhythm 79 bpm.   Recent Labs: 06/14/2020: ALT 13; BUN 26; Creatinine, Ser 0.95; Hemoglobin 11.1; Platelets 120.0; Potassium 4.1; Sodium 141  Recent Lipid Panel    Component Value Date/Time   CHOL 143 05/21/2019 0816   CHOL 162 12/05/2014 0848   TRIG 60.0 05/21/2019 0816   HDL 59.50 05/21/2019 0816   HDL 61 12/05/2014 0848   CHOLHDL 2 05/21/2019 0816   VLDL 12.0 05/21/2019 0816   LDLCALC 71 05/21/2019 0816   LDLCALC 85 12/05/2014 0848   LDLDIRECT 113.0 02/15/2016 0930   Home Medications   Current Meds  Medication Sig  . amoxicillin (AMOXIL) 500 MG tablet Take 4 tablets 60 minutes prior to dental procedures  . Ascorbic Acid (VITAMIN C) 1000 MG tablet Take 1 tablet by mouth daily.   Marland Kitchen azelastine (ASTELIN) 0.1 % nasal spray TAKE 2 SPRAYS INTO BOTH NOSTRILS TWICE DAILY AS DIRECTED  . BRIVIACT 25 MG TABS tablet Take one tablet (25MG ) by mouth in the AM and two tablets (50MG ) by mouth in the PM.  . butalbital-acetaminophen-caffeine (FIORICET) 50-325-40 MG tablet TAKE ONE TABLET BY MOUTH DAILY AS NEEDED FOR BACK PAIN OR MIGRAINE HEADEACHE - MAXIMUM OF 3 TABLETS DAILY  . Cholecalciferol (VITAMIN D) 2000 units CAPS Take 2,000 Units by mouth daily.  Marland Kitchen ELIQUIS 5 MG TABS tablet TAKE ONE TABLET TWICE DAILY  . fluticasone (FLONASE) 50 MCG/ACT nasal spray USE 2 SPRAYS IN BOTH NOSTRILS DAILY  . Folic Acid 0.8 MG CAPS Take 0.8 mg by mouth daily.  . furosemide (LASIX) 40 MG tablet Take 1 tablet (40 mg total) by mouth as directed.  . hydroquinone 4 % cream Apply topically in the morning and at bedtime.   . lamoTRIgine (LAMICTAL) 150 MG tablet Take 150 mg by mouth 2 (two) times daily.  Marland Kitchen losartan (COZAAR) 25 MG tablet Take 0.5 tablets (12.5 mg total) by mouth daily.  . metoprolol succinate (TOPROL-XL) 50 MG 24 hr tablet  Take 0.5 tablets (25 mg total) by mouth daily. Take with or immediately following a meal.  . montelukast (SINGULAIR) 10 MG tablet TAKE ONE TABLET BY MOUTH AT BEDTIME  . polyethylene glycol powder (GLYCOLAX/MIRALAX) 17 GM/SCOOP powder Take 1 Dose by mouth daily.  . rosuvastatin (CRESTOR) 40 MG tablet TAKE ONE TABLET EVERY DAY  . spironolactone (  ALDACTONE) 25 MG tablet Take 0.5 tablets (12.5 mg total) by mouth daily.  Marland Kitchen triamcinolone cream (KENALOG) 0.1 % Apply 1 application topically 2 (two) times daily.  . VENTOLIN HFA 108 (90 Base) MCG/ACT inhaler Inhale 2 puffs into the lungs as needed.   . vitamin B-12 (CYANOCOBALAMIN) 1000 MCG tablet Take 1,000 mcg by mouth daily.     Review of Systems  All other systems reviewed and are otherwise negative except as noted above.  Physical Exam    VS:  BP 118/76 (BP Location: Left Arm, Patient Position: Sitting, Cuff Size: Normal)   Pulse 79   Ht 5' 5.5" (1.664 m)   Wt 146 lb (66.2 kg)   SpO2 94%   BMI 23.93 kg/m  , BMI Body mass index is 23.93 kg/m.  Wt Readings from Last 3 Encounters:  01/25/21 146 lb (66.2 kg)  10/25/20 145 lb 3.2 oz (65.9 kg)  10/24/20 144 lb (65.3 kg)    GEN: Well nourished, well developed, in no acute distress. HEENT: normal. Neck: Supple, no JVD, carotid bruits, or masses. Cardiac: RRR, no murmurs, rubs, or gallops. No clubbing, cyanosis. Nonpitting pedal edema bilaterally.  Radials/DP/PT 2+ and equal bilaterally.  Respiratory:  Respirations regular and unlabored, clear to auscultation bilaterally. GI: Soft, nontender, nondistended. MS: No deformity or atrophy. Skin: Warm and dry, no rash. Bilateral LE with varicose veins.  Neuro:  Strength and sensation are intact. Psych: Normal affect.  Assessment & Plan    1. Severe MR s/p minimally invasive MVR - Echo 11/28/20 with LVEF 42-70%, LV idastolic parameters indeterminate, RV normal size and function, stable MV prosthesis. Continue with periodic echo for monitoring.  Management of volume status, as below.    2. Atrial fibrillation s/p MAZE on chronic anticoagulation - No recurrent atrial fibrillation. Tolerating Eliquis 5mg  BID without bleeding complications. Does not meet dose reduction criteria. CBC for monitoring.   3. Sinus node dysfunction s/p PPM - Follows with Dr. Lovena Le.   4. HFrEF - Nonpitting pedal edema on exam. NYHA II with fatigue. Reports increasing utilization of Lasix. Continue Toprol 12.5mg  QD, Losartan 12.5mg  QD, Lasix 20-40mg  PRN. Titration of GDMT limited by relative hypotension though she reports no lightheadedness, dizziness, near syncope, syncope. Start Spironolactone 12.5mg  daily for optimization of GDMT. BMP, BNP, CBC today and repeat BMP, BNP in 2 weeks. Future optimization of HF therapy may be limited by relative hypotension though Farxiga or Delene Loll could be considered.   5. Seizure - Continue to follow with neurology.   6. HLD - Continue Rosuvastatin 40mg  QD. Follows with PCP.   Disposition: Follow up in 1 month(s) with Dr. Rockey Situ or APP   Signed, Loel Dubonnet, NP 01/25/2021, 2:18 PM [place]

## 2021-01-25 NOTE — Patient Instructions (Addendum)
Medication Instructions:  Your physician has recommended you make the following change in your medication:   START Spironolactone (Aldactone) 12.5mg  daily *This is to help increase your heart pumping function, prevent fluid retention, and to help decrease your swelling*  CONTINUE Furosemide (Lasix) 20-40mg  as needed for weight gain of 2 pounds overnight or 5 pounds in one week.  *If you need a refill on your cardiac medications before your next appointment, please call your pharmacy*  Lab Work: Your provider recommends that you return for lab work today: BMP, BNP, CBC  Your physician recommends that you return for lab work in 2 weeks at [facility] at [facility] for BMP and BNP. You do not need an appointment, simply stop by [facility] anytime Monday through Friday 8a-5p. You do not need to be fasting for this labwork.   If you have labs (blood work) drawn today and your tests are completely normal, you will receive your results only by: Marland Kitchen MyChart Message (if you have MyChart) OR . A paper copy in the mail If you have any lab test that is abnormal or we need to change your treatment, we will call you to review the results.  Testing/Procedures: Your EKG today shows your pacemaker is functioning appropriately.    Follow-Up: At [facility], you and your health needs are our priority.  As part of our continuing mission to provide you with exceptional heart care, we have created designated Provider Care Teams.  These Care Teams include your primary Cardiologist (physician) and Advanced Practice Providers (APPs -  Physician Assistants and Nurse Practitioners) who all work together to provide you with the care you need, when you need it.  We recommend signing up for the patient portal called "MyChart".  Sign up information is provided on this After Visit Summary.  MyChart is used to connect with patients for Virtual Visits (Telemedicine).  Patients are able to view lab/test results,  encounter notes, upcoming appointments, etc.  Non-urgent messages can be sent to your provider as well.   To learn more about what you can do with MyChart, go to [email or web address].    Your next appointment:   1 month(s)  The format for your next appointment:   In Person  Provider:   You may see Ida Rogue, MD or one of the following Advanced Practice Providers on your designated Care Team:    Murray Hodgkins, NP  Christell Faith, PA-C  Marrianne Mood, PA-C  Cadence Kathlen Mody, [place]  Laurann Montana, NP  Other Instructions  Heart Failure, Self-Care Heart failure is a serious condition. The following information explains things you need to do to take care of yourself at home. To help you stay as healthy as possible, you may be asked to change your diet, take certain medicines, and make other changes in your life. Your doctor may also give you more specific instructions. If you have problems or questions, call your doctor. What are the risks? Having heart failure makes it more likely for you to have some problems. These problems can get worse if you do not take good care of yourself. Problems may include:  Damage to the kidneys, liver, or lungs.  Malnutrition.  Abnormal heart rhythms.  Blood clotting problems that could cause a stroke. Supplies needed:  Scale for weighing yourself.  Blood pressure monitor.  Notebook.  Medicines. How to care for yourself when you have heart failure Medicines Take over-the-counter and prescription medicines only as told by your doctor. Take your medicines  every day.  Do not stop taking your medicine unless your doctor tells you to do so.  Do not skip any medicines.  Get your prescriptions refilled before you run out of medicine. This is important.  Talk with your doctor if you cannot afford your medicines. Eating and drinking  Eat heart-healthy foods. Talk with a diet specialist (dietitian) to create an eating plan.  Limit  salt (sodium) if told by your doctor. Ask your diet specialist to tell you which seasonings are healthy for your heart.  Cook in healthy ways instead of frying. Healthy ways of cooking include roasting, grilling, broiling, baking, poaching, steaming, and stir-frying.  Choose foods that: ? Have no trans fat. ? Are low in saturated fat and cholesterol.  Choose healthy foods, such as: ? Fresh or frozen fruits and vegetables. ? Fish. ? Low-fat (lean) meats. ? Legumes, such as beans, peas, and lentils. ? Fat-free or low-fat dairy products. ? Whole-grain foods. ? High-fiber foods.  Limit how much fluid you drink, if told by your doctor.   Alcohol use  Do not drink alcohol if: ? Your doctor tells you not to drink. ? Your heart was damaged by alcohol, or you have very bad heart failure. ? You are pregnant, may be pregnant, or are planning to become pregnant.  If you drink alcohol: ? Limit how much you have to:  0-1 drink a day for women.  0-2 drinks a day for men. ? Know how much alcohol is in your drink. In the [place] one drink equals one 12 oz bottle of beer (355 mL), one 5 oz glass of wine (148 mL), or one 1 oz glass of hard liquor (44 mL). Lifestyle  Do not smoke or use any products that contain nicotine or tobacco. If you need help quitting, ask your doctor. ? Do not use nicotine gum or patches before talking to your doctor.  Do not use illegal drugs.  Lose weight if told by your doctor.  Do physical activity if told by your doctor. Talk to your doctor before you begin an exercise if: ? You are an older adult. ? You have very bad heart failure.  Learn to manage stress. If you need help, ask your doctor.  Get physical rehab (rehabilitation) to help you stay independent and to help with your quality of life.  Participate in a cardiac rehab program. This program helps you improve your health through exercise, education, and counseling.  Plan time to rest when you get  tired.   Check weight and blood pressure  Weigh yourself every day. This will help you to know if fluid is building up in your body. ? Weigh yourself every morning after you pee (urinate) and before you eat breakfast. ? Wear the same amount of clothing each time. ? Write down your daily weight. Give your record to your doctor.  Check and write down your blood pressure as told by your doctor.  Check your pulse as told by your doctor.   Dealing with very hot and very cold weather  If it is very hot: ? Avoid activities that take a lot of energy. ? Use air conditioning or fans, or find a cooler place. ? Avoid caffeine and alcohol. ? Wear clothing that is loose-fitting, lightweight, and light-colored.  If it is very cold: ? Avoid activities that take a lot of energy. ? Layer your clothes. ? Wear mittens or gloves, a hat, and a face covering when you go outside. ? Avoid  alcohol. Follow these instructions at home:  Stay up to date with shots (vaccines). Get pneumococcal and flu (influenza) shots.  Keep all follow-up visits. Contact a doctor if:  You gain 2-3 lb (1-1.4 kg) in 24 hours or 5 lb (2.3 kg) in a week.  You have increasing shortness of breath.  You cannot do your normal activities.  You get tired easily.  You cough a lot.  You do not feel like eating or feel like you may vomit (nauseous).  You have swelling in your hands, feet, ankles, or belly (abdomen).  You cannot sleep well because it is hard to breathe.  You feel like your heart is beating fast (palpitations).  You get dizzy when you stand up.  You feel depressed or sad. Get help right away if:  You have trouble breathing.  You or someone else notices a change in your behavior, such as having trouble staying awake.  You have chest pain or discomfort.  You pass out (faint). These symptoms may be an emergency. Get help right away. Call your local emergency services (911 in the [place]  Do not wait to  see if the symptoms will go away.  Do not drive yourself to the hospital. Summary  Heart failure is a serious condition. To care for yourself, you may have to change your diet, take medicines, and make other lifestyle changes.  Take your medicines every day. Do not stop taking them unless your doctor tells you to do so.  Limit salt and eat heart-healthy foods.  Ask your doctor if you can drink alcohol. You may have to stop alcohol use if you have very bad heart failure.  Contact your doctor if you gain weight quickly or feel that your heart is beating too fast. Get help right away if you pass out or have chest pain or trouble breathing. This information is not intended to replace advice given to you by your health care provider. Make sure you discuss any questions you have with your health care provider. Document Revised: 06/10/2020 Document Reviewed: 06/10/2020 Elsevier Patient Education  [place].

## 2021-01-26 LAB — BASIC METABOLIC PANEL
BUN/Creatinine Ratio: 27 (ref 12–28)
BUN: 26 mg/dL (ref 8–27)
CO2: 20 mmol/L (ref 20–29)
Calcium: 9.3 mg/dL (ref 8.7–10.3)
Chloride: 100 mmol/L (ref 96–106)
Creatinine, Ser: 0.98 mg/dL (ref 0.57–1.00)
GFR calc Af Amer: 63 mL/min/{1.73_m2} (ref >59)
GFR calc non Af Amer: 55 mL/min/{1.73_m2} — ABNORMAL LOW (ref >59)
Glucose: 94 mg/dL (ref 65–99)
Potassium: 4 mmol/L (ref 3.5–5.2)
Sodium: 141 mmol/L (ref 134–144)

## 2021-01-26 LAB — CBC
Hematocrit: 34.5 % (ref 34.0–46.6)
Hemoglobin: 11.7 g/dL (ref 11.1–15.9)
MCH: 31 pg (ref 26.6–33.0)
MCHC: 33.9 g/dL (ref 31.5–35.7)
MCV: 91 fL (ref 79–97)
Platelets: 116 10*3/uL — ABNORMAL LOW (ref 150–450)
RBC: 3.78 x10E6/uL (ref 3.77–5.28)
RDW: 12.5 % (ref 11.7–15.4)
WBC: 5.4 10*3/uL (ref 3.4–10.8)

## 2021-01-26 LAB — BRAIN NATRIURETIC PEPTIDE: BNP: 234 pg/mL — ABNORMAL HIGH (ref 0.0–100.0)

## 2021-01-31 ENCOUNTER — Other Ambulatory Visit: Payer: Self-pay | Admitting: Internal Medicine

## 2021-01-31 NOTE — Telephone Encounter (Signed)
Refilled: 08/01/2020 Last OV: 06/14/2020 Next OV: not scheduled

## 2021-02-08 ENCOUNTER — Other Ambulatory Visit
Admission: RE | Admit: 2021-02-08 | Discharge: 2021-02-08 | Disposition: A | Discharge status: Home / Self Care | Payer: Medicare Other | Attending: Family | Admitting: Family

## 2021-02-08 DIAGNOSIS — I5022 Chronic systolic (congestive) heart failure: Secondary | ICD-10-CM

## 2021-02-08 DIAGNOSIS — R609 Edema, unspecified: Secondary | ICD-10-CM | POA: Insufficient documentation

## 2021-02-08 LAB — BASIC METABOLIC PANEL
Anion gap: 6 (ref 5–15)
BUN: 22 mg/dL (ref 8–23)
CO2: 28 mmol/L (ref 22–32)
Calcium: 9.2 mg/dL (ref 8.9–10.3)
Chloride: 104 mmol/L (ref 98–111)
Creatinine, Ser: 0.86 mg/dL (ref 0.44–1.00)
GFR, Estimated: >60 mL/min (ref >60)
Glucose, Bld: 87 mg/dL (ref 70–99)
Potassium: 3.9 mmol/L (ref 3.5–5.1)
Sodium: 138 mmol/L (ref 135–145)

## 2021-02-08 LAB — BRAIN NATRIURETIC PEPTIDE: B Natriuretic Peptide: 308.2 pg/mL — ABNORMAL HIGH (ref 0.0–100.0)

## 2021-02-09 ENCOUNTER — Telehealth: Payer: Self-pay | Admitting: *Deleted

## 2021-02-09 DIAGNOSIS — I5022 Chronic systolic (congestive) heart failure: Secondary | ICD-10-CM

## 2021-02-09 DIAGNOSIS — R609 Edema, unspecified: Secondary | ICD-10-CM

## 2021-02-09 MED ORDER — SPIRONOLACTONE 25 MG PO TABS: 25.0000 mg | ORAL_TABLET | Freq: Every day | ORAL | 3 refills | Status: DC

## 2021-02-09 NOTE — Telephone Encounter (Signed)
Spoke to pt. Notified of lab results and provider's recc.  Pt verbalized understanding. She will incr Spironolactone to 25mg  daily.  Pt will take a whole tablet of remaining script, and new Rx sent to [facility] pharmacy per pt request.  Pt does report improvement of s/s. Pt has no further questions at this time. Will follow up on 3/25.

## 2021-02-09 NOTE — Telephone Encounter (Signed)
-----   Message from Loel Dubonnet, NP sent at 02/09/2021  8:15 AM EST ----- Normal kidney function and electrolytes. BNP shows mild increase in fluid volume compared to previous. Recommend increasing Spironolactone to 25mg  (one tablet) daily.

## 2021-02-15 ENCOUNTER — Other Ambulatory Visit: Payer: Self-pay | Admitting: Internal Medicine

## 2021-02-15 NOTE — Telephone Encounter (Signed)
Eliquis 5mg  refill request received. Patient is 80 years old, weight-66.2kg, Crea-0.86 on 02/08/21, Diagnosis-Afib, and last seen by Rushie Goltz, NP on 01/25/21. Dose is appropriate based on dosing criteria. Will send in refill to requested pharmacy.

## 2021-02-23 ENCOUNTER — Other Ambulatory Visit
Admission: RE | Admit: 2021-02-23 | Discharge: 2021-02-23 | Disposition: A | Discharge status: Home / Self Care | Payer: Medicare Other | Source: Ambulatory Visit | Attending: Family | Admitting: Family

## 2021-02-23 ENCOUNTER — Ambulatory Visit (INDEPENDENT_AMBULATORY_CARE_PROVIDER_SITE_OTHER): Payer: Medicare Other | Admitting: Family

## 2021-02-23 ENCOUNTER — Other Ambulatory Visit: Payer: Self-pay

## 2021-02-23 ENCOUNTER — Encounter: Payer: Self-pay | Admitting: Family

## 2021-02-23 VITALS — BP 110/64 | HR 95 | Ht 65.5 in | Wt 146.0 lb

## 2021-02-23 DIAGNOSIS — I5022 Chronic systolic (congestive) heart failure: Secondary | ICD-10-CM

## 2021-02-23 DIAGNOSIS — R609 Edema, unspecified: Secondary | ICD-10-CM

## 2021-02-23 DIAGNOSIS — I4819 Other persistent atrial fibrillation: Secondary | ICD-10-CM | POA: Diagnosis not present

## 2021-02-23 DIAGNOSIS — Z7901 Long term (current) use of anticoagulants: Secondary | ICD-10-CM

## 2021-02-23 DIAGNOSIS — Z952 Presence of prosthetic heart valve: Secondary | ICD-10-CM

## 2021-02-23 DIAGNOSIS — E782 Mixed hyperlipidemia: Secondary | ICD-10-CM

## 2021-02-23 LAB — BASIC METABOLIC PANEL
Anion gap: 9 (ref 5–15)
BUN: 30 mg/dL — ABNORMAL HIGH (ref 8–23)
CO2: 27 mmol/L (ref 22–32)
Calcium: 9.5 mg/dL (ref 8.9–10.3)
Chloride: 101 mmol/L (ref 98–111)
Creatinine, Ser: 1.05 mg/dL — ABNORMAL HIGH (ref 0.44–1.00)
GFR, Estimated: 54 mL/min — ABNORMAL LOW (ref >60)
Glucose, Bld: 98 mg/dL (ref 70–99)
Potassium: 4.5 mmol/L (ref 3.5–5.1)
Sodium: 137 mmol/L (ref 135–145)

## 2021-02-23 LAB — BRAIN NATRIURETIC PEPTIDE: B Natriuretic Peptide: 275 pg/mL — ABNORMAL HIGH (ref 0.0–100.0)

## 2021-02-23 MED ORDER — METOPROLOL SUCCINATE ER 25 MG PO TB24: 25.0000 mg | ORAL_TABLET | Freq: Every day | ORAL | 2 refills | Status: DC

## 2021-02-23 MED ORDER — ENTRESTO 24-26 MG PO TABS: 1.0000 | ORAL_TABLET | Freq: Two times a day (BID) | ORAL | 2 refills | Status: DC

## 2021-02-23 NOTE — Progress Notes (Signed)
Office Visit    Patient Name: [name] [name] Date of Encounter: 02/23/2021  PCP:  [name], MD   [place]  Cardiologist:  Ida Rogue, MD  Advanced Practice Provider:  No care team member to display Electrophysiologist:  Cristopher Peru, MD   Chief Complaint    [name] [name] is a 80 y.o. 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 Acute on chronic diastolic (congestive) heart failure ([place])   . Allergy    See list  . Anemia 2018  . Anxiety    Occasionally take Xanax for sleep  . Anxiety associated with depression    Prn alprazolam   . Anxiety associated with depression   . Arthritis    Hands, Back  . Atrial fibrillation, persistent ([place])    DCCV 08/22/2015  . Bradycardia post-op bradycardia, pacer dependent   MDT PPM 11/06/15, Dr. Lovena Le  . Cataract    Left eye  . Diverticulosis   . Focal seizures ([place])   . Heart murmur   . Hemorrhoid   . Hepatic cyst    innumerable  . 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 Hyperlipidemia   . IBS (irritable bowel syndrome)   . 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 Migraine   . MVP (mitral valve prolapse)   . Nodule of right lung   . Osteoporosis of forearm   . RBBB   . Restrictive lung disease    Mild on PFT & likely cardiac in etiology   . S/P Minimally invasive maze operation for atrial fibrillation 10/31/2015   Complete bilateral atrial lesion set using cryothermy and bipolar radiofrequency ablation with clipping of LA appendage via right mini thoracotomy approach  . S/P minimally invasive mitral valve replacement with bioprosthetic  valve 10/31/2015   33 mm [facility] Mitral bovine bioprosthetic tissue valve placed via right mini thoracotomy approach  . Severe mitral regurgitation   . 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 SVT (supraventricular tachycardia) ([place])   . Thoracic aorta atherosclerosis ([place])   . TIA (transient ischemic attack)    Past Surgical History:  Procedure Laterality Date  . BREAST EXCISIONAL BIOPSY Right Late 80s   Negative X2  . CARDIAC CATHETERIZATION N/A 10/18/2015   Procedure: Right/Left Heart Cath and Coronary Angiography;  Surgeon: Sherren Mocha, MD;  Location: [facility];  Service: Cardiovascular;  Laterality: N/A;  . CARDIOVERSION N/A 08/22/2015   Procedure: CARDIOVERSION;  Surgeon: Thayer Headings, MD;  Location: [facility];  Service: Cardiovascular;  Laterality: N/A;  . COLONOSCOPY  2003  . EP IMPLANTABLE DEVICE N/A 11/06/2015   Procedure: Pacemaker Implant;  Surgeon: Evans Lance, MD;  Location: [place] [facility];  Service: Cardiovascular;  Laterality: N/A;  . EP IMPLANTABLE DEVICE N/A 02/20/2016   Procedure: Lead Extraction;  Surgeon: Evans Lance, MD;  Location: [place];  Service: Cardiovascular;  Laterality: N/A;  . EYE SURGERY Right 2013  . FOOT SURGERY    . MANDIBLE FRACTURE SURGERY  03-26-13  . MINIMALLY INVASIVE MAZE PROCEDURE N/A 10/31/2015   Procedure: MINIMALLY  INVASIVE MAZE PROCEDURE;  Surgeon: Rexene Alberts, MD;  Location: [facility];  Service: Open Heart Surgery;  Laterality: N/A;  . MITRAL VALVE REPLACEMENT Right 10/31/2015   Procedure: MINIMALLY INVASIVE MITRAL VALVE (MV) REPLACEMENT;  Surgeon: Rexene Alberts, MD;  Location: [facility];  Service: Open Heart Surgery;  Laterality: Right;  . PACEMAKER LEAD REMOVAL  02/20/2016  . TEE WITH CARDIOVERSION    . TEE WITHOUT CARDIOVERSION N/A 08/22/2015   Procedure: TRANSESOPHAGEAL ECHOCARDIOGRAM (TEE);  Surgeon: Thayer Headings, MD;  Location: [facility];  Service: Cardiovascular;  Laterality:  N/A;  . TEE WITHOUT CARDIOVERSION N/A 10/31/2015   Procedure: TRANSESOPHAGEAL ECHOCARDIOGRAM (TEE);  Surgeon: Rexene Alberts, MD;  Location: [place];  Service: Open Heart Surgery;  Laterality: N/A;  . TUBAL LIGATION    . VARICOSE VEIN SURGERY Right     Allergies  Allergies  Allergen Reactions  . Epinephrine Palpitations  . Meloxicam Other (See Comments)    Severe reflux  . Codeine Nausea And Vomiting    migraine  . Dextromethorphan     seizures  . Diphenhydramine Other (See Comments)    seizure  . Diphenhydramine Hcl     Other reaction(s): Other (See Comments)  . Doxycycline Itching and Swelling    Facial  . Hydrocodone Nausea And Vomiting    MIGRAINE  . Hydromorphone Nausea And Vomiting    migraine  . Oxycodone Nausea And Vomiting    Severe migraine  . Propofol Other (See Comments)    History of Present Illness    [name] [name] is a 80 y.o. 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  Previous cardiac catheterization 10/2015 with no evidence of coronary artery disease. This was performed as part of workup for her MVR and MAZE performed by Dr. Roxy Manns in 2016.  Most resent 11/22/20 echocardiogram with LVEF 35-40% (stable compared to 2017), LV global hypokinesis, indeterminate LV diastolic parameters, RV normal size and function, normal PASP, stable bioprosthetic mitral valve. She was started on Losartan 12.5mg  QD and Metoprolol Tartrate transitioned to Succinate for optimization of HF therapy. She reported intolerance with difficulties in sleep though these symptoms resolved without changes to medical therapy.   She was seen 01/25/2021 after sending a MyChart message today previous due to fluid retention.  Long  discussion regarding heart failure with reduced ejection fraction and she was started on spironolactone 12.5 mg daily.  Repeat lab work 02/08/2021 with K3.9, creatinine 0.86, GFR greater than 60, BNP 308.2.  Her spironolactone was increased to 25 mg daily.  Presents today for follow-up.  Tells me her breathing has felt much better since starting spironolactone.  She was pleased that she took a field trip to [facility] through [facility] where she lives and was able to climb stairs, walk to the [place].  She has only had to take her as needed Lasix 1 time.  Reports blood pressure and weight at home have been stable. Reports no chest pain, pressure, or tightness. No edema, orthopnea, PND. Reports no palpitations.  She has been monitoring her sodium intake very carefully.  EKGs/Labs/Other Studies Reviewed:   The following studies were reviewed today:  Echo 11/17/20  1. Left ventricular ejection fraction, by estimation, is 35 to 40%. The  left ventricle  has moderately decreased function. The left ventricle  demonstrates global hypokinesis. Left ventricular diastolic parameters are  indeterminate. The average left  ventricular global longitudinal strain is -6.7 %.   2. Right ventricular systolic function is normal. The right ventricular  size is normal. There is normal pulmonary artery systolic pressure. The  estimated right ventricular systolic pressure is 02.3 mmHg.   3. Left atrial size was mildly dilated.   4. The mitral valve has been repaired/replaced. No evidence of mitral  valve regurgitation. No evidence of mitral stenosis. The mean mitral valve  gradient is 2.0 mmHg. There is a bioprosthetic valve present in the mitral  position. Procedure Date:  10/2015.   5. Tricuspid valve regurgitation is moderate.   EKG:  No EKG today.  Recent Labs: 06/14/2020: ALT 13 01/25/2021: Hemoglobin 11.7; Platelets 116 02/08/2021: B Natriuretic Peptide 308.2; BUN 22; Creatinine, Ser 0.86; Potassium  3.9; Sodium 138  Recent Lipid Panel    Component Value Date/Time   CHOL 143 05/21/2019 0816   CHOL 162 12/05/2014 0848   TRIG 60.0 05/21/2019 0816   HDL 59.50 05/21/2019 0816   HDL 61 12/05/2014 0848   CHOLHDL 2 05/21/2019 0816   VLDL 12.0 05/21/2019 0816   LDLCALC 71 05/21/2019 0816   LDLCALC 85 12/05/2014 0848   LDLDIRECT 113.0 02/15/2016 0930   Home Medications   Current Meds  Medication Sig  . amoxicillin (AMOXIL) 500 MG tablet Take 4 tablets 60 minutes prior to dental procedures  . Ascorbic Acid (VITAMIN C) 1000 MG tablet Take 1 tablet by mouth daily.   Marland Kitchen azelastine (ASTELIN) 0.1 % nasal spray TAKE 2 SPRAYS INTO BOTH NOSTRILS TWICE DAILY AS DIRECTED  . BRIVIACT 25 MG TABS tablet Take one tablet (25MG ) by mouth in the AM and two tablets (50MG ) by mouth in the PM.  . butalbital-acetaminophen-caffeine (FIORICET) 50-325-40 MG tablet TAKE ONE TABLET BY MOUTH DAILY AS NEEDED FOR BACK PAIN OR MIGRAINE HEADACHE**MAXIMUM OF 3 TABLETS DAILY  . Cholecalciferol (VITAMIN D) 2000 units CAPS Take 2,000 Units by mouth daily.  Marland Kitchen ELIQUIS 5 MG TABS tablet TAKE ONE TABLET TWICE DAILY  . fluticasone (FLONASE) 50 MCG/ACT nasal spray USE 2 SPRAYS IN BOTH NOSTRILS DAILY  . Folic Acid 0.8 MG CAPS Take 0.8 mg by mouth daily.  . furosemide (LASIX) 40 MG tablet Take 1 tablet (40 mg total) by mouth as directed.  . hydroquinone 4 % cream Apply topically in the morning and at bedtime.   . lamoTRIgine (LAMICTAL) 150 MG tablet Take 150 mg by mouth 2 (two) times daily.  . montelukast (SINGULAIR) 10 MG tablet TAKE ONE TABLET BY MOUTH AT BEDTIME  . polyethylene glycol powder (GLYCOLAX/MIRALAX) 17 GM/SCOOP powder Take 1 Dose by mouth daily.  . rosuvastatin (CRESTOR) 40 MG tablet TAKE ONE TABLET EVERY DAY  . spironolactone (ALDACTONE) 25 MG tablet Take 1 tablet (25 mg total) by mouth daily.  Marland Kitchen triamcinolone cream (KENALOG) 0.1 % Apply 1 application topically 2 (two) times daily.  . VENTOLIN HFA 108 (90 Base)  MCG/ACT inhaler Inhale 2 puffs into the lungs as needed.   . vitamin B-12 (CYANOCOBALAMIN) 1000 MCG tablet Take 1,000 mcg by mouth daily.     Review of Systems  All other systems reviewed and are otherwise negative except as noted above.  Physical Exam    VS:  BP 110/64   Pulse 95   Ht 5' 5.5" (1.664 m)   Wt 146 lb (66.2 kg)   BMI 23.93 kg/m  ,  BMI Body mass index is 23.93 kg/m.  Wt Readings from Last 3 Encounters:  02/23/21 146 lb (66.2 kg)  01/25/21 146 lb (66.2 kg)  10/25/20 145 lb 3.2 oz (65.9 kg)    GEN: Well nourished, well developed, in no acute distress. HEENT: normal. Neck: Supple, no JVD, carotid bruits, or masses. Cardiac: RRR, no murmurs, rubs, or gallops. No clubbing, cyanosis, edema. Radials/DP/PT 2+ and equal bilaterally.  Respiratory:  Respirations regular and unlabored, clear to auscultation bilaterally. GI: Soft, nontender, nondistended. MS: No deformity or atrophy. Skin: Warm and dry, no rash. Bilateral LE with varicose veins.  Neuro:  Strength and sensation are intact. Psych: Normal affect.  Assessment & Plan    1. Severe MR s/p minimally invasive MVR - Echo 11/28/20 with LVEF 26-33%, LV idastolic parameters indeterminate, RV normal size and function, stable MV prosthesis. Continue with periodic echo for monitoring. Management of volume status, as below.    2. Atrial fibrillation s/p MAZE on chronic anticoagulation - No recurrent atrial fibrillation. Tolerating Eliquis 5mg  BID without bleeding complications. Does not meet dose reduction criteria.   3. Sinus node dysfunction s/p PPM - Follows with Dr. Lovena Le.   4. HFrEF -reports shortness of breath and energy level improved since starting spironolactone.  As her renal function today shows mild dehydration by BMP, reduce spironolactone to 12.5 mg daily.  For further optimization of her GDMT we will stop losartan and start Entresto 24-26 mg twice daily.  Coupon for free 30-day supply provided in clinic today.   Additional GDMT includes  Continue Toprol 12.5mg  QD, , Lasix 20-40mg  PRN. Titration of GDMT limited by relative hypotension though she reports no lightheadedness, dizziness, near syncope, syncope and as such we will trial Entresto.  BNP today shows fluid volume improving. Future optimization of HF therapy may include Farxiga.  5. Seizure - Continue to follow with neurology.   6. HLD - Continue Rosuvastatin 40mg  QD. Follows with PCP.   Disposition: Follow up in 1 month(s) with Dr. Rockey Situ or APP   Signed, Loel Dubonnet, NP 02/23/2021, 2:08 PM [place]

## 2021-02-23 NOTE — Patient Instructions (Addendum)
Medication Instructions:   Your physician has recommended you make the following change in your medication:   STOP Losartan  START Entresto 24-26mg  twice daily  CONTINUE Spironolactone 25mg  daily  *If you need a refill on your cardiac medications before your next appointment, please call your pharmacy*  Lab Work: Your physician recommends lab work today: BMP, BNP  -  Please go to the [facility]. You will check in at the front desk to the right as you walk into the atrium.    If you have labs (blood work) drawn today and your tests are completely normal, you will receive your results only by: Marland Kitchen MyChart Message (if you have MyChart) OR . A paper copy in the mail If you have any lab test that is abnormal or we need to change your treatment, we will call you to review the results.   Testing/Procedures: None ordered today.   Follow-Up: At [facility], you and your health needs are our priority.  As part of our continuing mission to provide you with exceptional heart care, we have created designated Provider Care Teams.  These Care Teams include your primary Cardiologist (physician) and Advanced Practice Providers (APPs -  Physician Assistants and Nurse Practitioners) who all work together to provide you with the care you need, when you need it.  We recommend signing up for the patient portal called "MyChart".  Sign up information is provided on this After Visit Summary.  MyChart is used to connect with patients for Virtual Visits (Telemedicine).  Patients are able to view lab/test results, encounter notes, upcoming appointments, etc.  Non-urgent messages can be sent to your provider as well.   To learn more about what you can do with MyChart, go to [email or web address].    Your next appointment:   1 month(s)  The format for your next appointment:   In Person  Provider:   You may see Ida Rogue, MD or one of the following Advanced Practice Providers on your  designated Care Team:    Murray Hodgkins, NP  Christell Faith, PA-C  Marrianne Mood, PA-C  Cadence Kathlen Mody, [place]  Laurann Montana, NP  Other Instructions  Recommend weighing daily and keeping a log. Report weight gain of 2 pounds overnight or 5 pounds in 1 week.   We will start Entresto to improve your guideline directed therapy for heart failure. Continue to monitor your blood pressure, weight at home. If your systolic blood pressure (the top number) is consistently less than 100 or you feel lightheaded or dizzy, call our office.

## 2021-02-26 MED ORDER — SPIRONOLACTONE 25 MG PO TABS: 12.5000 mg | ORAL_TABLET | Freq: Every day | ORAL | 3 refills | Status: DC

## 2021-02-27 ENCOUNTER — Ambulatory Visit (INDEPENDENT_AMBULATORY_CARE_PROVIDER_SITE_OTHER): Payer: Medicare Other

## 2021-02-27 DIAGNOSIS — I442 Atrioventricular block, complete: Secondary | ICD-10-CM | POA: Diagnosis not present

## 2021-02-27 LAB — CUP PACEART REMOTE DEVICE CHECK
Battery Impedance: 528 Ohm
Battery Remaining Longevity: 74 mo
Battery Voltage: 2.79 V
Brady Statistic AP VP Percent: 98 %
Brady Statistic AP VS Percent: 0 %
Brady Statistic AS VP Percent: 2 %
Brady Statistic AS VS Percent: 0 %
Date Time Interrogation Session: 20220329091432
Implantable Lead Implant Date: 20161205
Implantable Lead Implant Date: 20170321
Implantable Lead Location: 753859
Implantable Lead Location: 753860
Implantable Lead Model: 5076
Implantable Lead Model: 5076
Implantable Pulse Generator Implant Date: 20161205
Lead Channel Impedance Value: 446 Ohm
Lead Channel Impedance Value: 786 Ohm
Lead Channel Pacing Threshold Amplitude: 0.5 V
Lead Channel Pacing Threshold Amplitude: 0.625 V
Lead Channel Pacing Threshold Pulse Width: 0.4 ms
Lead Channel Pacing Threshold Pulse Width: 0.4 ms
Lead Channel Setting Pacing Amplitude: 2 V
Lead Channel Setting Pacing Amplitude: 2.5 V
Lead Channel Setting Pacing Pulse Width: 0.4 ms
Lead Channel Setting Sensing Sensitivity: 4 mV

## 2021-03-05 ENCOUNTER — Encounter: Payer: Self-pay | Admitting: Family

## 2021-03-05 ENCOUNTER — Other Ambulatory Visit: Payer: Self-pay | Admitting: *Deleted

## 2021-03-05 ENCOUNTER — Other Ambulatory Visit
Admission: RE | Admit: 2021-03-05 | Discharge: 2021-03-05 | Disposition: A | Discharge status: Home / Self Care | Payer: Medicare Other | Source: Ambulatory Visit | Attending: Family | Admitting: Family

## 2021-03-05 DIAGNOSIS — R609 Edema, unspecified: Secondary | ICD-10-CM

## 2021-03-05 DIAGNOSIS — I5022 Chronic systolic (congestive) heart failure: Secondary | ICD-10-CM | POA: Insufficient documentation

## 2021-03-05 LAB — BASIC METABOLIC PANEL
Anion gap: 9 (ref 5–15)
BUN: 33 mg/dL — ABNORMAL HIGH (ref 8–23)
CO2: 27 mmol/L (ref 22–32)
Calcium: 9.4 mg/dL (ref 8.9–10.3)
Chloride: 100 mmol/L (ref 98–111)
Creatinine, Ser: 1.07 mg/dL — ABNORMAL HIGH (ref 0.44–1.00)
GFR, Estimated: 53 mL/min — ABNORMAL LOW (ref >60)
Glucose, Bld: 92 mg/dL (ref 70–99)
Potassium: 4.2 mmol/L (ref 3.5–5.1)
Sodium: 136 mmol/L (ref 135–145)

## 2021-03-06 NOTE — Telephone Encounter (Signed)
Spoke with patient and reviewed results, recommendations, and changes in medication as well. Instructed her to hold Entresto and continue spironolactone half tablet (12.5 mg) once daily. Confirmed upcoming appointment and she verbalized understanding of our conversation, agreement with plan, and had no further questions at this time.     Loel Dubonnet, NP  You 49 minutes ago (8:04 AM)    Her blood work showed that her kidney function was slightly worse than baseline. Her electrolytes were normal.   In the setting of hypotension, recommend she stop Entresto as it likely is lowering blood pressure. I anticipate much of her fatigue is due to low blood pressure.   On previous lab result I recommended reducing Spironolactone to 12.5 mg every other day. Since we are discontinuing Entresto, she may continue Spironolactone 12.5 mg daily.

## 2021-03-07 ENCOUNTER — Telehealth: Payer: Self-pay | Admitting: *Deleted

## 2021-03-07 DIAGNOSIS — I5022 Chronic systolic (congestive) heart failure: Secondary | ICD-10-CM

## 2021-03-07 DIAGNOSIS — R609 Edema, unspecified: Secondary | ICD-10-CM

## 2021-03-07 MED ORDER — SPIRONOLACTONE 25 MG PO TABS: 12.5000 mg | ORAL_TABLET | ORAL | 3 refills | Status: DC

## 2021-03-07 NOTE — Telephone Encounter (Signed)
Per result note 4/5, pt has been made aware of results and med change to reduce Spironolactone 12.5mg  every other day. Med list updated to reflect changes.

## 2021-03-07 NOTE — Telephone Encounter (Signed)
-----   Message from Loel Dubonnet, NP sent at 03/05/2021  3:01 PM EDT ----- Kidney function remains slightly decreased from her baseline. Recommend reduce Spironolactone to 12.5 mg (half tablet) every other day.

## 2021-03-13 NOTE — Progress Notes (Signed)
Remote pacemaker transmission.   

## 2021-03-15 ENCOUNTER — Other Ambulatory Visit: Payer: Self-pay | Admitting: Internal Medicine

## 2021-03-26 ENCOUNTER — Encounter: Payer: Self-pay | Admitting: Family

## 2021-03-26 ENCOUNTER — Ambulatory Visit (INDEPENDENT_AMBULATORY_CARE_PROVIDER_SITE_OTHER): Payer: Medicare Other | Admitting: Family

## 2021-03-26 ENCOUNTER — Other Ambulatory Visit: Payer: Self-pay

## 2021-03-26 VITALS — BP 120/80 | HR 82 | Ht 65.5 in | Wt 150.0 lb

## 2021-03-26 DIAGNOSIS — Z950 Presence of cardiac pacemaker: Secondary | ICD-10-CM

## 2021-03-26 DIAGNOSIS — I5022 Chronic systolic (congestive) heart failure: Secondary | ICD-10-CM | POA: Diagnosis not present

## 2021-03-26 DIAGNOSIS — Z952 Presence of prosthetic heart valve: Secondary | ICD-10-CM

## 2021-03-26 DIAGNOSIS — Z7901 Long term (current) use of anticoagulants: Secondary | ICD-10-CM

## 2021-03-26 DIAGNOSIS — E782 Mixed hyperlipidemia: Secondary | ICD-10-CM | POA: Diagnosis not present

## 2021-03-26 DIAGNOSIS — I4819 Other persistent atrial fibrillation: Secondary | ICD-10-CM | POA: Diagnosis not present

## 2021-03-26 MED ORDER — LOSARTAN POTASSIUM 25 MG PO TABS: 12.5000 mg | ORAL_TABLET | Freq: Every day | ORAL | 3 refills | Status: DC

## 2021-03-26 MED ORDER — FUROSEMIDE 40 MG PO TABS: ORAL_TABLET | ORAL | 11 refills | Status: DC

## 2021-03-26 NOTE — Progress Notes (Signed)
Office Visit    Patient Name: [name] [name] Date of Encounter: 03/26/2021  PCP:  [name], MD   [place]  Cardiologist:  Ida Rogue, MD  Advanced Practice Provider:  No care team member to display Electrophysiologist:  Cristopher Peru, MD   Chief Complaint    [name] [name] is a 80 y.o. 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 Acute on chronic diastolic (congestive) heart failure ([place])   . Allergy    See list  . Anemia 2018  . Anxiety    Occasionally take Xanax for sleep  . Anxiety associated with depression    Prn alprazolam   . Anxiety associated with depression   . Arthritis    Hands, Back  . Atrial fibrillation, persistent ([place])    DCCV 08/22/2015  . Bradycardia post-op bradycardia, pacer dependent   MDT PPM 11/06/15, Dr. Lovena Le  . Cataract    Left eye  . Diverticulosis   . Focal seizures ([place])   . Heart murmur   . Hemorrhoid   . Hepatic cyst    innumerable  . 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 Hyperlipidemia   . IBS (irritable bowel syndrome)   . 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 Migraine   . MVP (mitral valve prolapse)   . Nodule of right lung   . Osteoporosis of forearm   . RBBB   . Restrictive lung disease    Mild on PFT & likely cardiac in etiology   . S/P Minimally invasive maze operation for atrial fibrillation 10/31/2015   Complete bilateral atrial lesion set using cryothermy and bipolar radiofrequency ablation with clipping of LA appendage via right mini thoracotomy approach  . S/P minimally invasive mitral valve replacement with bioprosthetic  valve 10/31/2015   33 mm [facility] Mitral bovine bioprosthetic tissue valve placed via right mini thoracotomy approach  . Severe mitral regurgitation   . 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 SVT (supraventricular tachycardia) ([place])   . Thoracic aorta atherosclerosis ([place])   . TIA (transient ischemic attack)    Past Surgical History:  Procedure Laterality Date  . BREAST EXCISIONAL BIOPSY Right Late 80s   Negative X2  . CARDIAC CATHETERIZATION N/A 10/18/2015   Procedure: Right/Left Heart Cath and Coronary Angiography;  Surgeon: Sherren Mocha, MD;  Location: [place] [facility];  Service: Cardiovascular;  Laterality: N/A;  . CARDIOVERSION N/A 08/22/2015   Procedure: CARDIOVERSION;  Surgeon: Thayer Headings, MD;  Location: [place];  Service: Cardiovascular;  Laterality: N/A;  . COLONOSCOPY  2003  . EP IMPLANTABLE DEVICE N/A 11/06/2015   Procedure: Pacemaker Implant;  Surgeon: Evans Lance, MD;  Location: [place] [facility];  Service: Cardiovascular;  Laterality: N/A;  . EP IMPLANTABLE DEVICE N/A 02/20/2016   Procedure: Lead Extraction;  Surgeon: Evans Lance, MD;  Location: [place] [facility];  Service: Cardiovascular;  Laterality: N/A;  . EYE SURGERY Right 2013  . FOOT SURGERY    . MANDIBLE FRACTURE SURGERY  03-26-13  . MINIMALLY INVASIVE MAZE PROCEDURE N/A 10/31/2015   Procedure: MINIMALLY  INVASIVE MAZE PROCEDURE;  Surgeon: Rexene Alberts, MD;  Location: [place];  Service: Open Heart Surgery;  Laterality: N/A;  . MITRAL VALVE REPLACEMENT Right 10/31/2015   Procedure: MINIMALLY INVASIVE MITRAL VALVE (MV) REPLACEMENT;  Surgeon: Rexene Alberts, MD;  Location: [place];  Service: Open Heart Surgery;  Laterality: Right;  . PACEMAKER LEAD REMOVAL  02/20/2016  . TEE WITH CARDIOVERSION    . TEE WITHOUT CARDIOVERSION N/A 08/22/2015   Procedure: TRANSESOPHAGEAL ECHOCARDIOGRAM (TEE);  Surgeon: Thayer Headings, MD;  Location: [place];  Service: Cardiovascular;  Laterality:  N/A;  . TEE WITHOUT CARDIOVERSION N/A 10/31/2015   Procedure: TRANSESOPHAGEAL ECHOCARDIOGRAM (TEE);  Surgeon: Rexene Alberts, MD;  Location: [place];  Service: Open Heart Surgery;  Laterality: N/A;  . TUBAL LIGATION    . VARICOSE VEIN SURGERY Right     Allergies  Allergies  Allergen Reactions  . Epinephrine Palpitations  . Meloxicam Other (See Comments)    Severe reflux  . Codeine Nausea And Vomiting    migraine  . Dextromethorphan     seizures  . Diphenhydramine Other (See Comments)    seizure  . Diphenhydramine Hcl     Other reaction(s): Other (See Comments)  . Doxycycline Itching and Swelling    Facial  . Hydrocodone Nausea And Vomiting    MIGRAINE  . Hydromorphone Nausea And Vomiting    migraine  . Oxycodone Nausea And Vomiting    Severe migraine  . Propofol Other (See Comments)    History of Present Illness    [name] [name] is a 80 y.o. 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 This was performed as part of workup for her MVR and MAZE performed by Dr. Roxy Manns in 2016.  Most resent 11/22/20 echocardiogram with LVEF 35-40% (stable compared to 2017), LV global hypokinesis, indeterminate LV diastolic parameters, RV normal size and function, normal PASP, stable bioprosthetic mitral valve. She was started on Losartan 12.5mg  QD and Metoprolol Tartrate transitioned to Succinate for optimization of HF therapy. She reported intolerance with difficulties in sleep though these symptoms resolved without changes to medical therapy.   She was seen 01/25/2021 after sending a MyChart message today due to fluid retention.  Long discussion  regarding heart failure with reduced ejection fraction and she was started on spironolactone 12.5 mg daily.  Repeat lab work 02/08/2021 with K3.9, creatinine 0.86, GFR greater than 60, BNP 308.2.  Her spironolactone was increased to 25 mg daily.  Seen in follow up 02/23/21 with improvement in her breathing. She was using Spironolactone as prescribed and Lasix only PRN. BMP with mild kidney decline and Spironolactone reduced to 12.5mg  QD. She was trialed on Entresto but did not tolerate due to hypotension.   She presents today for follow up. Tells me last Monday, Tuesday, and Wednesday she took a dose of Furosemide which was more frequent than previous due to swelling and sensation of fluid retention. Notes stable dyspnea on exertion. No edema, orthopnea, PND. No chest pain, pressure, tightness. Endorses following a low salt heart healthy diet.  EKGs/Labs/Other Studies Reviewed:   The following studies were reviewed today:  Echo 11/17/20  1. Left ventricular ejection fraction, by estimation, is 35  to 40%. The  left ventricle has moderately decreased function. The left ventricle  demonstrates global hypokinesis. Left ventricular diastolic parameters are  indeterminate. The average left  ventricular global longitudinal strain is -6.7 %.   2. Right ventricular systolic function is normal. The right ventricular  size is normal. There is normal pulmonary artery systolic pressure. The  estimated right ventricular systolic pressure is [id number] mmHg.   3. Left atrial size was mildly dilated.   4. The mitral valve has been repaired/replaced. No evidence of mitral  valve regurgitation. No evidence of mitral stenosis. The mean mitral valve  gradient is 2.0 mmHg. There is a bioprosthetic valve present in the mitral  position. Procedure Date:  10/2015.   5. Tricuspid valve regurgitation is moderate.   EKG:  EKG ordered today. The EKG performed today demonstrates AV dual paced rhythm 82 bpm.  Recent  Labs: 06/14/2020: ALT 13 01/25/2021: Hemoglobin 11.7; Platelets 116 02/23/2021: B Natriuretic Peptide 275.0 03/05/2021: BUN 33; Creatinine, Ser 1.07; Potassium 4.2; Sodium 136  Recent Lipid Panel    Component Value Date/Time   CHOL 143 05/21/2019 0816   CHOL 162 12/05/2014 0848   TRIG 60.0 05/21/2019 0816   HDL 59.50 05/21/2019 0816   HDL 61 12/05/2014 0848   CHOLHDL 2 05/21/2019 0816   VLDL 12.0 05/21/2019 0816   LDLCALC 71 05/21/2019 0816   LDLCALC 85 12/05/2014 0848   LDLDIRECT 113.0 02/15/2016 0930   Home Medications   Current Meds  Medication Sig  . amoxicillin (AMOXIL) 500 MG tablet Take 4 tablets 60 minutes prior to dental procedures  . azelastine (ASTELIN) 0.1 % nasal spray TAKE 2 SPRAYS INTO BOTH NOSTRILS TWICE DAILY AS DIRECTED  . BRIVIACT 25 MG TABS tablet Take one tablet (25MG ) by mouth in the AM and two tablets (50MG ) by mouth in the PM.  . butalbital-acetaminophen-caffeine (FIORICET) 50-325-40 MG tablet TAKE ONE TABLET BY MOUTH DAILY AS NEEDED FOR BACK PAIN OR MIGRAINE HEADACHE**MAXIMUM OF 3 TABLETS DAILY  . Cholecalciferol (VITAMIN D) 2000 units CAPS Take 2,000 Units by mouth daily.  Marland Kitchen ELIQUIS 5 MG TABS tablet TAKE ONE TABLET TWICE DAILY  . fluticasone (FLONASE) 50 MCG/ACT nasal spray USE 2 SPRAYS IN BOTH NOSTRILS DAILY  . furosemide (LASIX) 40 MG tablet Take 1 tablet (40 mg total) by mouth as directed.  . hydroquinone 4 % cream Apply topically in the morning and at bedtime.   . lamoTRIgine (LAMICTAL) 150 MG tablet Take 150 mg by mouth 2 (two) times daily.  . metoprolol succinate (TOPROL XL) 25 MG 24 hr tablet Take 1 tablet (25 mg total) by mouth daily.  . montelukast (SINGULAIR) 10 MG tablet TAKE ONE TABLET BY MOUTH AT BEDTIME  . polyethylene glycol powder (GLYCOLAX/MIRALAX) 17 GM/SCOOP powder Take 1 Dose by mouth daily.  . rosuvastatin (CRESTOR) 40 MG tablet TAKE ONE TABLET EVERY DAY  . spironolactone (ALDACTONE) 25 MG tablet Take 12.5 mg by mouth daily.  Marland Kitchen  triamcinolone cream (KENALOG) 0.1 % Apply 1 application topically 2 (two) times daily.  . VENTOLIN HFA 108 (90 Base) MCG/ACT inhaler Inhale 2 puffs into the lungs as needed.      Review of Systems  All other systems reviewed and are otherwise negative except as noted above.  Physical Exam    VS:  BP 120/80 (BP Location: Left Arm, Patient Position: Sitting, Cuff Size: Normal)   Pulse 82   Ht 5' 5.5" (1.664 m)   Wt 150 lb (68 kg)   SpO2 96%  BMI 24.58 kg/m  , BMI Body mass index is 24.58 kg/m.  Wt Readings from Last 3 Encounters:  03/26/21 150 lb (68 kg)  02/23/21 146 lb (66.2 kg)  01/25/21 146 lb (66.2 kg)    GEN: Well nourished, well developed, in no acute distress. HEENT: normal. Neck: Supple, no JVD, carotid bruits, or masses. Cardiac: RRR, no murmurs, rubs, or gallops. No clubbing, cyanosis, edema. Radials/PT 2+ and equal bilaterally.  Respiratory:  Respirations regular and unlabored, clear to auscultation bilaterally. GI: Soft, nontender, nondistended. MS: No deformity or atrophy. Skin: Warm and dry, no rash. Bilateral LE with varicose veins.  Neuro:  Strength and sensation are intact. Psych: Normal affect.  Assessment & Plan    1. Severe MR s/p minimally invasive MVR - Echo 11/28/20 with LVEF 98-92%, LV diastolic parameters indeterminate, RV normal size and function, stable MV prosthesis. Continue with periodic echo for monitoring. Management of volume status, as below.    2. Atrial fibrillation s/p MAZE on chronic anticoagulation - No recurrent atrial fibrillation.  CBC 01/25/2021 hemoglobin 11.7, hematocrit 34.5.  Tolerating Eliquis 5mg  BID without bleeding complications. Does not meet dose reduction criteria.   3. Sinus node dysfunction s/p PPM - Follows with Dr. Lovena Le.   4. HFrEF - Intolerant to Entresto due to hypotension. Resume Losartan 12.5mg  daily. Continue Spironolactone 12.5mg  daily. Change Furosemide (Lasix) to 40mg  twice per week with additional dose as  needed for fluid retention. Continue Toprol 12.5mg  QD.Titration of GDMT limited by relative hypotension. BNP, BMP today.  Future considerations include addition of SGLT2 I.  5. Seizure - Continue to follow with neurology.   6. HLD - Continue Rosuvastatin 40mg  QD. Follows with PCP.   Disposition: Follow up in 2 month(s) with Dr. Rockey Situ or APP   Signed, Loel Dubonnet, NP 03/26/2021, 2:48 PM [place]

## 2021-03-26 NOTE — Patient Instructions (Addendum)
Medication Instructions:  Your physician has recommended you make the following change in your medication:   CONTINUE Spironolactone 12.5mg  daily  CHANGE Furosemide (Lasix) to 40mg  twice per week with additional dose as needed for fluid retention  RESUME Losartan 12.5mg  daily  *If you need a refill on your cardiac medications before your next appointment, please call your pharmacy*   Lab Work: Your physician recommends lab work today: BMP, BNP  If you have labs (blood work) drawn today and your tests are completely normal, you will receive your results only by: Marland Kitchen MyChart Message (if you have MyChart) OR . A paper copy in the mail If you have any lab test that is abnormal or we need to change your treatment, we will call you to review the results.   Testing/Procedures: Your EKG today shows your pacemaker is functioning appropriately.   Follow-Up: At [facility], you and your health needs are our priority.  As part of our continuing mission to provide you with exceptional heart care, we have created designated Provider Care Teams.  These Care Teams include your primary Cardiologist (physician) and Advanced Practice Providers (APPs -  Physician Assistants and Nurse Practitioners) who all work together to provide you with the care you need, when you need it.  We recommend signing up for the patient portal called "MyChart".  Sign up information is provided on this After Visit Summary.  MyChart is used to connect with patients for Virtual Visits (Telemedicine).  Patients are able to view lab/test results, encounter notes, upcoming appointments, etc.  Non-urgent messages can be sent to your provider as well.   To learn more about what you can do with MyChart, go to [email or web address].    Your next appointment:   2 month(s)  The format for your next appointment:   In Person  Provider:   You may see Ida Rogue, MD or one of the following Advanced Practice Providers on your  designated Care Team:    Murray Hodgkins, NP  Christell Faith, PA-C  Marrianne Mood, PA-C  Cadence Kathlen Mody, [place]  Laurann Montana, NP  Also follow up is due in August with Dr. Lovena Le, Loel Dubonnet, NP will send a note to Dr. Tanna Furry scheduling team and they will reach out to you to schedule.  Other Instructions  Heart Healthy Diet Recommendations: A low-salt diet is recommended. Meats should be grilled, baked, or boiled. Avoid fried foods. Focus on lean protein sources like fish or chicken with vegetables and fruits. The American Heart Association is a Microbiologist!  American Heart Association Diet and Lifeystyle Recommendations   Exercise recommendations: The American Heart Association recommends 150 minutes of moderate intensity exercise weekly. Try 30 minutes of moderate intensity exercise 4-5 times per week. This could include walking, jogging, or swimming.  Recommend weighing daily and keeping a log. Please call our office if you have weight gain of 2 pounds overnight or 5 pounds in 1 week.

## 2021-03-27 ENCOUNTER — Telehealth: Payer: Self-pay | Admitting: *Deleted

## 2021-03-27 DIAGNOSIS — I5022 Chronic systolic (congestive) heart failure: Secondary | ICD-10-CM

## 2021-03-27 DIAGNOSIS — Z79899 Other long term (current) drug therapy: Secondary | ICD-10-CM

## 2021-03-27 LAB — BASIC METABOLIC PANEL
BUN/Creatinine Ratio: 16 (ref 12–28)
BUN: 25 mg/dL (ref 8–27)
CO2: 24 mmol/L (ref 20–29)
Calcium: 9.2 mg/dL (ref 8.7–10.3)
Chloride: 100 mmol/L (ref 96–106)
Creatinine, Ser: 1.54 mg/dL — ABNORMAL HIGH (ref 0.57–1.00)
Glucose: 97 mg/dL (ref 65–99)
Potassium: 4.3 mmol/L (ref 3.5–5.2)
Sodium: 140 mmol/L (ref 134–144)
eGFR: 34 mL/min/{1.73_m2} — ABNORMAL LOW (ref >59)

## 2021-03-27 LAB — BRAIN NATRIURETIC PEPTIDE: BNP: 267.8 pg/mL — ABNORMAL HIGH (ref 0.0–100.0)

## 2021-03-27 MED ORDER — SPIRONOLACTONE 25 MG PO TABS: 12.5000 mg | ORAL_TABLET | ORAL | Status: DC

## 2021-03-27 NOTE — Telephone Encounter (Signed)
Spoke to pt. Notified of lab results and provider's recc.  Pt verbalized understanding.  She will reduce Spironolactone to 12.5mg  every other day. Continue Lasix 40mg  twice per week.  [facility]. Lab orders placed.  Will notify of BNP results when available.  Pt has no further questions at this time.

## 2021-03-27 NOTE — Telephone Encounter (Signed)
-----   Message from Loel Dubonnet, NP sent at 03/27/2021  3:19 PM EDT ----- Renal function slight decline in setting of her recently having taken Lasix. Recommend reduce Spironolactone to 12.5mg  every other day. Continue with plan for Lasix 40mg  twice per week as discussed in clinic. Recommend repeat BMP at [facility] in 2 weeks.  Await result of BNP.

## 2021-04-10 ENCOUNTER — Other Ambulatory Visit
Admission: RE | Admit: 2021-04-10 | Discharge: 2021-04-10 | Disposition: A | Discharge status: Home / Self Care | Payer: Medicare Other | Attending: Family | Admitting: Family

## 2021-04-10 ENCOUNTER — Other Ambulatory Visit: Payer: Self-pay

## 2021-04-10 DIAGNOSIS — Z79899 Other long term (current) drug therapy: Secondary | ICD-10-CM | POA: Diagnosis present

## 2021-04-10 DIAGNOSIS — I5022 Chronic systolic (congestive) heart failure: Secondary | ICD-10-CM

## 2021-04-10 LAB — BASIC METABOLIC PANEL
Anion gap: 7 (ref 5–15)
BUN: 19 mg/dL (ref 8–23)
CO2: 29 mmol/L (ref 22–32)
Calcium: 9.1 mg/dL (ref 8.9–10.3)
Chloride: 100 mmol/L (ref 98–111)
Creatinine, Ser: 0.99 mg/dL (ref 0.44–1.00)
GFR, Estimated: 58 mL/min — ABNORMAL LOW (ref >60)
Glucose, Bld: 98 mg/dL (ref 70–99)
Potassium: 3.9 mmol/L (ref 3.5–5.1)
Sodium: 136 mmol/L (ref 135–145)

## 2021-04-12 ENCOUNTER — Ambulatory Visit
Admission: RE | Admit: 2021-04-12 | Discharge: 2021-04-12 | Disposition: A | Discharge status: Home / Self Care | Payer: Medicare Other | Source: Ambulatory Visit | Attending: Adult Health | Admitting: Adult Health

## 2021-04-12 ENCOUNTER — Encounter: Payer: Self-pay | Admitting: Adult Health

## 2021-04-12 ENCOUNTER — Ambulatory Visit (INDEPENDENT_AMBULATORY_CARE_PROVIDER_SITE_OTHER): Payer: Medicare Other | Admitting: Adult Health

## 2021-04-12 ENCOUNTER — Ambulatory Visit
Admission: RE | Admit: 2021-04-12 | Discharge: 2021-04-12 | Disposition: A | Discharge status: Home / Self Care | Payer: Medicare Other | Attending: Adult Health | Admitting: Adult Health

## 2021-04-12 ENCOUNTER — Other Ambulatory Visit: Payer: Self-pay

## 2021-04-12 VITALS — BP 110/60 | HR 87 | Temp 97.9°F | Ht 65.51 in | Wt 148.6 lb

## 2021-04-12 DIAGNOSIS — M25552 Pain in left hip: Secondary | ICD-10-CM | POA: Diagnosis present

## 2021-04-12 DIAGNOSIS — G8929 Other chronic pain: Secondary | ICD-10-CM

## 2021-04-12 DIAGNOSIS — M545 Low back pain, unspecified: Secondary | ICD-10-CM | POA: Diagnosis present

## 2021-04-12 DIAGNOSIS — W108XXA Fall (on) (from) other stairs and steps, initial encounter: Secondary | ICD-10-CM

## 2021-04-12 DIAGNOSIS — W19XXXA Unspecified fall, initial encounter: Secondary | ICD-10-CM | POA: Insufficient documentation

## 2021-04-12 DIAGNOSIS — Y92009 Unspecified place in unspecified non-institutional (private) residence as the place of occurrence of the external cause: Secondary | ICD-10-CM | POA: Insufficient documentation

## 2021-04-12 LAB — CBC WITH DIFFERENTIAL/PLATELET
Basophils Absolute: 0 10*3/uL (ref 0.0–0.1)
Basophils Relative: 1.1 % (ref 0.0–3.0)
Eosinophils Absolute: 0.1 10*3/uL (ref 0.0–0.7)
Eosinophils Relative: 3.3 % (ref 0.0–5.0)
HCT: 32.9 % — ABNORMAL LOW (ref 36.0–46.0)
Hemoglobin: 11.2 g/dL — ABNORMAL LOW (ref 12.0–15.0)
Lymphocytes Relative: 34.1 % (ref 12.0–46.0)
Lymphs Abs: 1.5 10*3/uL (ref 0.7–4.0)
MCHC: 34.1 g/dL (ref 30.0–36.0)
MCV: 92.1 fl (ref 78.0–100.0)
Monocytes Absolute: 0.3 10*3/uL (ref 0.1–1.0)
Monocytes Relative: 7.2 % (ref 3.0–12.0)
Neutro Abs: 2.4 10*3/uL (ref 1.4–7.7)
Neutrophils Relative %: 54.3 % (ref 43.0–77.0)
Platelets: 105 10*3/uL — ABNORMAL LOW (ref 150.0–400.0)
RBC: 3.57 Mil/uL — ABNORMAL LOW (ref 3.87–5.11)
RDW: 13.9 % (ref 11.5–15.5)
WBC: 4.4 10*3/uL (ref 4.0–10.5)

## 2021-04-12 NOTE — Progress Notes (Signed)
Acute Office Visit  Subjective:    Patient ID: [name] [name], female    DOB: [birth year], 80 y.o.   MRN: [id number]  Chief Complaint  Patient presents with  . Back Pain    Pt c/o back pain along left hip x1year  . Hip Pain    Pt c/o a fall that injured left hip x23month Pt c/o right hip pain x1week    Back Pain This is a recurrent problem. The current episode started more than 1 year ago. The problem occurs intermittently. The problem is unchanged. The pain is present in the lumbar spine. The pain is at a severity of 4/10. The pain is mild. The pain is the same all the time. The symptoms are aggravated by bending, sitting and standing. Pertinent negatives include no abdominal pain, bladder incontinence, bowel incontinence, chest pain, dysuria, fever, headaches, leg pain, numbness, paresis, paresthesias, pelvic pain, perianal numbness, tingling, weakness or weight loss. Treatments tried: back brace seems to help.  The treatment provided mild relief.  Hip Pain  The incident occurred more than 1 week ago (since march. ). The incident occurred at the park (on a trip tour fell on step landed on left hip. ). The injury mechanism was a fall. The pain is present in the left hip. The quality of the pain is described as aching. The pain is at a severity of 4/10. The pain is moderate. The pain has been intermittent since onset. Pertinent negatives include no inability to bear weight, loss of motion, loss of sensation, muscle weakness, numbness or tingling. She reports no foreign bodies present. The symptoms are aggravated by movement and weight bearing. She has tried acetaminophen for the symptoms. The treatment provided mild relief.  Denies any urinary symptoms or hematuria.  Patient is in today for has wore a back brace the past two days and this has helped her back pain she reports.  She reports she is still having hip pain.  She has had some chronic back pain for one year and hip left hip since  march when she missed a step and she hit hard on left hip.  She does report that since  Patient  denies any fever ,chills, rash, chest pain, shortness of breath, nausea, vomiting, or diarrhea.    Past Medical History:  Diagnosis Date  . Acute on chronic diastolic (congestive) heart failure ([facility]   . Allergy    See list  . Anemia 2018  . Anxiety    Occasionally take Xanax for sleep  . Anxiety associated with depression    Prn alprazolam   . Anxiety associated with depression   . Arthritis    Hands, Back  . Atrial fibrillation, persistent ([facility]    DCCV 08/22/2015  . Bradycardia post-op bradycardia, pacer dependent   MDT PPM 11/06/15, Dr. TLovena Le . Cataract    Left eye  . Diverticulosis   . Focal seizures ([facility]   . Heart murmur   . Hemorrhoid   . Hepatic cyst    innumerable  . 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 Hyperlipidemia   . IBS (irritable bowel syndrome)   . 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 Migraine   . MVP (mitral valve prolapse)   . Nodule of right lung   . Osteoporosis of forearm   . RBBB   . Restrictive lung disease    Mild on PFT & likely cardiac in etiology   . S/P Minimally invasive maze operation for atrial  fibrillation 10/31/2015   Complete bilateral atrial lesion set using cryothermy and bipolar radiofrequency ablation with clipping of LA appendage via right mini thoracotomy approach  . S/P minimally invasive mitral valve replacement with bioprosthetic valve 10/31/2015   33 mm [facility] Mitral bovine bioprosthetic tissue valve placed via right mini thoracotomy approach  . Severe mitral regurgitation   . 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 SVT (supraventricular tachycardia) ([place])   . Thoracic aorta atherosclerosis ([place])   . TIA (transient ischemic attack)     Past Surgical History:  Procedure Laterality Date  . BREAST EXCISIONAL BIOPSY Right Late 80s   Negative X2  . CARDIAC CATHETERIZATION N/A 10/18/2015    Procedure: Right/Left Heart Cath and Coronary Angiography;  Surgeon: Sherren Mocha, MD;  Location: [place] [facility];  Service: Cardiovascular;  Laterality: N/A;  . CARDIOVERSION N/A 08/22/2015   Procedure: CARDIOVERSION;  Surgeon: Thayer Headings, MD;  Location: [place];  Service: Cardiovascular;  Laterality: N/A;  . COLONOSCOPY  2003  . EP IMPLANTABLE DEVICE N/A 11/06/2015   Procedure: Pacemaker Implant;  Surgeon: Evans Lance, MD;  Location: [place] [facility];  Service: Cardiovascular;  Laterality: N/A;  . EP IMPLANTABLE DEVICE N/A 02/20/2016   Procedure: Lead Extraction;  Surgeon: Evans Lance, MD;  Location: [place] [facility];  Service: Cardiovascular;  Laterality: N/A;  . EYE SURGERY Right 2013  . FOOT SURGERY    . MANDIBLE FRACTURE SURGERY  03-26-13  . MINIMALLY INVASIVE MAZE PROCEDURE N/A 10/31/2015   Procedure: MINIMALLY INVASIVE MAZE PROCEDURE;  Surgeon: Rexene Alberts, MD;  Location: [place];  Service: Open Heart Surgery;  Laterality: N/A;  . MITRAL VALVE REPLACEMENT Right 10/31/2015   Procedure: MINIMALLY INVASIVE MITRAL VALVE (MV) REPLACEMENT;  Surgeon: Rexene Alberts, MD;  Location: [place];  Service: Open Heart Surgery;  Laterality: Right;  . PACEMAKER LEAD REMOVAL  02/20/2016  . TEE WITH CARDIOVERSION    . TEE WITHOUT CARDIOVERSION N/A 08/22/2015   Procedure: TRANSESOPHAGEAL ECHOCARDIOGRAM (TEE);  Surgeon: Thayer Headings, MD;  Location: [facility];  Service: Cardiovascular;  Laterality: N/A;  . TEE WITHOUT CARDIOVERSION N/A 10/31/2015   Procedure: TRANSESOPHAGEAL ECHOCARDIOGRAM (TEE);  Surgeon: Rexene Alberts, MD;  Location: [place];  Service: Open Heart Surgery;  Laterality: N/A;  . TUBAL LIGATION    . VARICOSE VEIN SURGERY Right     Family History  Problem Relation Age of Onset  . Hypertension Mother   . Arrhythmia Mother   . Heart failure Mother   . Arrhythmia Brother   . Stroke Brother 87       cerebral hemorrhage, nonsmoker, no HTN  . Prostate cancer  Brother   . Stroke Father        from an aneurysm  . Stroke Maternal Aunt 83       cerebral hemorrhage  . Liver cancer Maternal Grandmother   . Atrial fibrillation Son   . Celiac disease Son   . Heart attack Neg Hx   . Breast cancer Neg Hx     Social History   Socioeconomic History  . Marital status: Widowed    Spouse name: Deceased  . Number of children: 2  . Years of education: 39  . Highest education level: Not on file  Occupational History  . Occupation: Retired  Tobacco Use  . Smoking status: Never Smoker  . Smokeless tobacco: Never Used  Vaping Use  . Vaping Use: Never used  Substance  and Sexual Activity  . Alcohol use: No    Alcohol/week: 3.0 standard drinks    Types: 3 Standard drinks or equivalent per week  . Drug use: No  . Sexual activity: Never  Other Topics Concern  . Not on file  Social History Narrative   She is a widow.  Husband died from metastatic renal cell cancer. Originally from [place]. Previously lived in [place] from 1975-[place]. Moved to [place] in 2007. No mold exposure recently but did have it through a prior work exposure in 1993. Has a masters in public health. No bird exposure.      [facility] (09/29/17):   She has moved into a retirement community since last appointment. She reports she had testing at her new residence that was positive for mold. It has since been treated.    Social Determinants of Health   Financial Resource Strain: Not on file  Food Insecurity: Not on file  Transportation Needs: Not on file  Physical Activity: Not on file  Stress: Not on file  Social Connections: Not on file  Intimate Partner Violence: Not on file    Outpatient Medications Prior to Visit  Medication Sig Dispense Refill  . amoxicillin (AMOXIL) 500 MG tablet Take 4 tablets 60 minutes prior to dental procedures 30 tablet 6  . azelastine (ASTELIN) 0.1 % nasal spray TAKE 2 SPRAYS INTO BOTH NOSTRILS TWICE DAILY AS DIRECTED 30 mL 12  . BRIVIACT 25 MG  TABS tablet Take one tablet (25MG) by mouth in the AM and two tablets (50MG) by mouth in the PM.    . butalbital-acetaminophen-caffeine (FIORICET) 50-325-40 MG tablet TAKE ONE TABLET BY MOUTH DAILY AS NEEDED FOR BACK PAIN OR MIGRAINE HEADACHE**MAXIMUM OF 3 TABLETS DAILY 90 tablet 0  . ELIQUIS 5 MG TABS tablet TAKE ONE TABLET TWICE DAILY 180 tablet 1  . fluticasone (FLONASE) 50 MCG/ACT nasal spray USE 2 SPRAYS IN BOTH NOSTRILS DAILY 16 g 11  . Folic Acid 0.8 MG CAPS Take 0.8 mg by mouth daily.    . furosemide (LASIX) 40 MG tablet Take one tablet twice per week. Take an additional 0.5-1 tablet as needed for fluid retention. 30 tablet 11  . hydroquinone 4 % cream Apply topically in the morning and at bedtime.     . lamoTRIgine (LAMICTAL) 150 MG tablet Take 150 mg by mouth 2 (two) times daily.    Marland Kitchen losartan (COZAAR) 25 MG tablet Take 0.5 tablets (12.5 mg total) by mouth daily. 45 tablet 3  . metoprolol succinate (TOPROL XL) 25 MG 24 hr tablet Take 1 tablet (25 mg total) by mouth daily. 90 tablet 2  . montelukast (SINGULAIR) 10 MG tablet TAKE ONE TABLET BY MOUTH AT BEDTIME 90 tablet 1  . polyethylene glycol powder (GLYCOLAX/MIRALAX) 17 GM/SCOOP powder Take 1 Dose by mouth daily.    . rosuvastatin (CRESTOR) 40 MG tablet TAKE ONE TABLET EVERY DAY 90 tablet 1  . spironolactone (ALDACTONE) 25 MG tablet Take 0.5 tablets (12.5 mg total) by mouth every other day.    . triamcinolone cream (KENALOG) 0.1 % Apply 1 application topically 2 (two) times daily. 30 g 0  . VENTOLIN HFA 108 (90 Base) MCG/ACT inhaler Inhale 2 puffs into the lungs as needed.     . Ascorbic Acid (VITAMIN C) 1000 MG tablet Take 1 tablet by mouth daily.  (Patient not taking: No sig reported)    . Cholecalciferol (VITAMIN D) 2000 units CAPS Take 2,000 Units by mouth daily. (Patient not taking: Reported  on 04/12/2021)    . vitamin B-12 (CYANOCOBALAMIN) 1000 MCG tablet Take 1,000 mcg by mouth daily. (Patient not taking: No sig reported)     No  facility-administered medications prior to visit.    Allergies  Allergen Reactions  . Epinephrine Palpitations  . Meloxicam Other (See Comments)    Severe reflux  . Codeine Nausea And Vomiting    migraine  . Dextromethorphan     seizures  . Diphenhydramine Other (See Comments)    seizure  . Diphenhydramine Hcl     Other reaction(s): Other (See Comments)  . Doxycycline Itching and Swelling    Facial  . Hydrocodone Nausea And Vomiting    MIGRAINE  . Hydromorphone Nausea And Vomiting    migraine  . Oxycodone Nausea And Vomiting    Severe migraine  . Propofol Other (See Comments)    Review of Systems  Constitutional: Negative.  Negative for fever and weight loss.  Respiratory: Negative.   Cardiovascular: Negative.  Negative for chest pain.  Gastrointestinal: Negative for abdominal pain and bowel incontinence.  Genitourinary: Negative.  Negative for bladder incontinence, dysuria and pelvic pain.  Musculoskeletal: Positive for arthralgias and back pain. Negative for gait problem, joint swelling, myalgias, neck pain and neck stiffness.  Skin: Negative.   Neurological: Negative for tingling, weakness, numbness, headaches and paresthesias.  Psychiatric/Behavioral: Negative.        Objective:    Physical Exam Constitutional:      General: She is not in acute distress.    Appearance: She is not ill-appearing, toxic-appearing or diaphoretic.  HENT:     Head: Normocephalic and atraumatic.     Right Ear: External ear normal.     Left Ear: External ear normal.     Nose: Nose normal.     Mouth/Throat:     Mouth: Mucous membranes are moist.     Pharynx: No oropharyngeal exudate or posterior oropharyngeal erythema.  Eyes:     General: No scleral icterus.       Right eye: No discharge.        Left eye: No discharge.     Extraocular Movements: Extraocular movements intact.     Conjunctiva/sclera: Conjunctivae normal.     Pupils: Pupils are equal, round, and reactive to light.   Cardiovascular:     Rate and Rhythm: Normal rate and regular rhythm.     Pulses: Normal pulses.     Heart sounds: Normal heart sounds. No murmur heard. No friction rub. No gallop.   Pulmonary:     Effort: Pulmonary effort is normal. No respiratory distress.     Breath sounds: Normal breath sounds. No stridor. No wheezing, rhonchi or rales.  Chest:     Chest wall: No tenderness.  Abdominal:     General: There is no distension.     Palpations: Abdomen is soft. There is no mass.     Tenderness: There is no abdominal tenderness. There is no right CVA tenderness, left CVA tenderness or guarding.  Neurological:     Mental Status: She is alert and oriented to person, place, and time.  Psychiatric:        Mood and Affect: Mood normal.        Behavior: Behavior normal.        Thought Content: Thought content normal.        Judgment: Judgment normal.     BP 110/60 (BP Location: Left Arm, Patient Position: Sitting)   Pulse 87   Temp 97.9 F (36.6  C)   Ht 5' 5.51" (1.664 m)   Wt 148 lb 9.6 oz (67.4 kg)   SpO2 98%   BMI 24.34 kg/m  Wt Readings from Last 3 Encounters:  04/12/21 148 lb 9.6 oz (67.4 kg)  03/26/21 150 lb (68 kg)  02/23/21 146 lb (66.2 kg)    Vitals with BMI 04/12/2021 03/26/2021 02/23/2021  Height 5' 5.512" 5' 5.5" 5' 5.5"  Weight 148 lbs 10 oz 150 lbs 146 lbs  BMI 24.34 24.09 73.53  Systolic 299 242 683  Diastolic 60 80 64  Pulse 87 82 95   There are no preventive care reminders to display for this patient.  There are no preventive care reminders to display for this patient.   Lab Results  Component Value Date   TSH 4.24 05/21/2019   Lab Results  Component Value Date   WBC 5.4 01/25/2021   HGB 11.7 01/25/2021   HCT 34.5 01/25/2021   MCV 91 01/25/2021   PLT 116 (L) 01/25/2021   Lab Results  Component Value Date   NA 136 04/10/2021   K 3.9 04/10/2021   CO2 29 04/10/2021   GLUCOSE 98 04/10/2021   BUN 19 04/10/2021   CREATININE 0.99 04/10/2021    BILITOT 0.3 06/14/2020   ALKPHOS 75 06/14/2020   AST 21 06/14/2020   ALT 13 06/14/2020   PROT 6.2 06/14/2020   ALBUMIN 4.6 06/14/2020   CALCIUM 9.1 04/10/2021   ANIONGAP 7 04/10/2021   EGFR 34 (L) 03/26/2021   GFR 56.65 (L) 06/14/2020   Lab Results  Component Value Date   CHOL 143 05/21/2019   Lab Results  Component Value Date   HDL 59.50 05/21/2019   Lab Results  Component Value Date   LDLCALC 71 05/21/2019   Lab Results  Component Value Date   TRIG 60.0 05/21/2019   Lab Results  Component Value Date   CHOLHDL 2 05/21/2019   Lab Results  Component Value Date   HGBA1C 5.5 02/11/2020       Assessment & Plan:   Problem List Items Addressed This Visit      Other   Left hip pain - Primary   Relevant Orders   DG Hip Unilat W OR W/O Pelvis 2-3 Views Right   DG Hip Unilat W OR W/O Pelvis 2-3 Views Left   CBC with Differential/Platelet   Chronic bilateral low back pain without sciatica   Relevant Orders   DG Lumbar Spine Complete   Fall (on) (from) other stairs and steps, initial encounter     Orders Placed This Encounter  Procedures  . DG Lumbar Spine Complete  . DG Hip Unilat W OR W/O Pelvis 2-3 Views Right  . DG Hip Unilat W OR W/O Pelvis 2-3 Views Left  . CBC with Differential/Platelet  No x ray in the office today she will go to outpatient imaging.  Desires no medication treatment / management today " just wants to be sure she did not  Have injury at time of fall in March. Advised using cane for walking to help prevent future falls.    Rest with ice/ alternation with heat. Advised ok to use over the counter topical like ICY Hot to see if any relief.   No orders of the defined types were placed in this encounter.  Return in about 3 weeks (around 05/03/2021), or if symptoms worsen or fail to improve, for at any time for any worsening symptoms, Go to Emergency room/ urgent care if worse. Sharyn Lull  Rolinda Roan, FNP

## 2021-04-12 NOTE — Patient Instructions (Addendum)
[facility] [place],  [place]  [address] Get Driving Directions Main: [phone]   Chronic Back Pain When back pain lasts longer than 3 months, it is called chronic back pain. Pain may get worse at certain times (flare-ups). There are things you can do at home to manage your pain. Follow these instructions at home: Pay attention to any changes in your symptoms. Take these actions to help with your pain: Managing pain and stiffness  If told, put ice on the painful area. Your doctor may tell you to use ice for 24-48 hours after the flare-up starts. To do this: ? Put ice in a plastic bag. ? Place a towel between your skin and the bag. ? Leave the ice on for 20 minutes, 2-3 times a day.  If told, put heat on the painful area. Do this as often as told by your doctor. Use the heat source that your doctor recommends, such as a moist heat pack or a heating pad. ? Place a towel between your skin and the heat source. ? Leave the heat on for 20-30 minutes. ? Take off the heat if your skin turns bright red. This is especially important if you are unable to feel pain, heat, or cold. You may have a greater risk of getting burned.  Soak in a warm bath. This can help relieve pain.      Activity  Avoid bending and other activities that make pain worse.  When standing: ? Keep your upper back and neck straight. ? Keep your shoulders pulled back. ? Avoid slouching.  When sitting: ? Keep your back straight. ? Relax your shoulders. Do not round your shoulders or pull them backward.  Do not sit or stand in one place for long periods of time.  Take short rest breaks during the day. Lying down or standing is usually better than sitting. Resting can help relieve pain.  When sitting or lying down for a long time, do some mild activity or stretching. This will help to prevent stiffness and pain.  Get regular exercise. Ask your doctor  what activities are safe for you.  Do not lift anything that is heavier than 10 lb (4.5 kg) or the limit that you are told, until your doctor says that it is safe.  To prevent injury when you lift things: ? Bend your knees. ? Keep the weight close to your body. ? Avoid twisting.  Sleep on a firm mattress. Try lying on your side with your knees slightly bent. If you lie on your back, put a pillow under your knees.   Medicines  Treatment may include medicines for pain and swelling taken by mouth or put on the skin, prescription pain medicine, or muscle relaxants.  Take over-the-counter and prescription medicines only as told by your doctor.  Ask your doctor if the medicine prescribed to you: ? Requires you to avoid driving or using machinery. ? Can cause trouble pooping (constipation). You may need to take these actions to prevent or treat trouble pooping:  Drink enough fluid to keep your pee (urine) pale yellow.  Take over-the-counter or prescription medicines.  Eat foods that are high in fiber. These include beans, whole grains, and fresh fruits and vegetables.  Limit foods that are high in fat and sugars. These include fried or sweet foods. General instructions  Do not use any products that contain nicotine or tobacco, such as cigarettes, e-cigarettes, and chewing tobacco. If you  need help quitting, ask your doctor.  Keep all follow-up visits as told by your doctor. This is important. Contact a doctor if:  Your pain does not get better with rest or medicine.  Your pain gets worse, or you have new pain.  You have a high fever.  You lose weight very quickly.  You have trouble doing your normal activities. Get help right away if:  One or both of your legs or feet feel weak.  One or both of your legs or feet lose feeling (have numbness).  You have trouble controlling when you poop (have a bowel movement) or pee (urinate).  You have bad back pain and: ? You feel like  you may vomit (nauseous), or you vomit. ? You have pain in your belly (abdomen). ? You have shortness of breath. ? You faint. Summary  When back pain lasts longer than 3 months, it is called chronic back pain.  Pain may get worse at certain times (flare-ups).  Use ice and heat as told by your doctor. Your doctor may tell you to use ice after flare-ups. This information is not intended to replace advice given to you by your health care provider. Make sure you discuss any questions you have with your health care provider. Document Revised: 12/29/2019 Document Reviewed: 12/29/2019 Elsevier Patient Education  2021 [place]. Hip Pain The hip is the joint between the upper legs and the lower pelvis. The bones, cartilage, tendons, and muscles of your hip joint support your body and allow you to move around. Hip pain can range from a minor ache to severe pain in one or both of your hips. The pain may be felt on the inside of the hip joint near the groin, or on the outside near the buttocks and upper thigh. You may also have swelling or stiffness in your hip area. Follow these instructions at home: Managing pain, stiffness, and swelling  If directed, put ice on the painful area. To do this: ? Put ice in a plastic bag. ? Place a towel between your skin and the bag. ? Leave the ice on for 20 minutes, 2-3 times a day.  If directed, apply heat to the affected area as often as told by your health care provider. Use the heat source that your health care provider recommends, such as a moist heat pack or a heating pad. ? Place a towel between your skin and the heat source. ? Leave the heat on for 20-30 minutes. ? Remove the heat if your skin turns bright red. This is especially important if you are unable to feel pain, heat, or cold. You may have a greater risk of getting burned.      Activity  Do exercises as told by your health care provider.  Avoid activities that cause pain. General  instructions  Take over-the-counter and prescription medicines only as told by your health care provider.  Keep a journal of your symptoms. Write down: ? How often you have hip pain. ? The location of your pain. ? What the pain feels like. ? What makes the pain worse.  Sleep with a pillow between your legs on your most comfortable side.  Keep all follow-up visits as told by your health care provider. This is important.   Contact a health care provider if:  You cannot put weight on your leg.  Your pain or swelling continues or gets worse after one week.  It gets harder to walk.  You have a fever.  Get help right away if:  You fall.  You have a sudden increase in pain and swelling in your hip.  Your hip is red or swollen or very tender to touch. Summary  Hip pain can range from a minor ache to severe pain in one or both of your hips.  The pain may be felt on the inside of the hip joint near the groin, or on the outside near the buttocks and upper thigh.  Avoid activities that cause pain.  Write down how often you have hip pain, the location of the pain, what makes it worse, and what it feels like. This information is not intended to replace advice given to you by your health care provider. Make sure you discuss any questions you have with your health care provider. Document Revised: 04/05/2019 Document Reviewed: 04/05/2019 Elsevier Patient Education  [place].

## 2021-04-15 ENCOUNTER — Encounter: Payer: Self-pay | Admitting: Adult Health

## 2021-04-15 NOTE — Progress Notes (Signed)
Cbc lab is stable from previous lab, still has chronic low platelets and hemoglobin. Report any bleeding.  Keep scheduled follow up with Dr. Derrel Nip. Return by appointment to the office or seek care immediately if any symptoms worsen change or persist at anytime.

## 2021-04-15 NOTE — Progress Notes (Signed)
No acute findings on right hip x ray and also shows mild degenerative change, recommend orthopedics referral if she is willing.  Advised patient call the office or your primary care doctor for an appointment if no improvement within 72 hours or if any symptoms change or worsen at any time  Advised ER or urgent Care if after hours or on weekend. Call 911 for emergency symptoms at any time.

## 2021-04-15 NOTE — Progress Notes (Signed)
Lumbar spine Moderate multilevel disc disease most prominent over the T12-L1 and L1- L2 levels, would recommend referral to orthopedics if pain is persisting.return to office at anytime if any symptoms persisting or worsening by appointment.  If she would like  referral let me know and will place.

## 2021-04-15 NOTE — Progress Notes (Signed)
No acute findings of left hip - mild degenerative changes of hip as well as degenerative spine seen also on her lumbar x ray, orthopedics referral if she would agree is advised.  Seek care immediatly of symptoms worsen, persist or change at anytime.

## 2021-04-16 ENCOUNTER — Other Ambulatory Visit: Payer: Self-pay | Admitting: Adult Health

## 2021-04-16 DIAGNOSIS — M25552 Pain in left hip: Secondary | ICD-10-CM

## 2021-04-16 DIAGNOSIS — M545 Low back pain, unspecified: Secondary | ICD-10-CM

## 2021-04-16 DIAGNOSIS — G8929 Other chronic pain: Secondary | ICD-10-CM

## 2021-04-16 NOTE — Progress Notes (Signed)
Orders Placed This Encounter  Procedures  . Ambulatory referral to Orthopedic Surgery  Left hip pain - Plan: Ambulatory referral to Orthopedic Surgery  Chronic bilateral low back pain without sciatica - Plan: Ambulatory referral to Orthopedic Surgery

## 2021-04-16 NOTE — Progress Notes (Signed)
Referral was placed to [facility] please let her know she can also go to [facility] walk in clinic today from 1 pm to 7 pm and that walk in is open Monday to Friday 1 pm to 7 pm. Advise her to do this if she would like.  If not she should hear with an appointment within 7 days from referral and if she does not she should call us.   [facility]- Address: [address] [place], [place], [place] [address] Open  Closes 9PM Phone: [phone]

## 2021-04-17 ENCOUNTER — Other Ambulatory Visit: Payer: Self-pay | Admitting: Internal Medicine

## 2021-04-18 ENCOUNTER — Ambulatory Visit (INDEPENDENT_AMBULATORY_CARE_PROVIDER_SITE_OTHER): Payer: Medicare Other | Admitting: *Deleted

## 2021-04-18 ENCOUNTER — Other Ambulatory Visit: Payer: Self-pay

## 2021-04-18 DIAGNOSIS — M810 Age-related osteoporosis without current pathological fracture: Secondary | ICD-10-CM

## 2021-04-18 MED ORDER — DENOSUMAB 60 MG/ML ~~LOC~~ SOSY
60.0000 mg | PREFILLED_SYRINGE | Freq: Once | SUBCUTANEOUS | Status: AC
  Administered 2021-04-18: 60 mg via SUBCUTANEOUS

## 2021-04-18 NOTE — Progress Notes (Signed)
Patient presented for Prolia injection to Right arm [place], patient voiced no concerns or complaints during or after injection. 

## 2021-04-18 NOTE — Telephone Encounter (Signed)
RX Refill:fioricet Last Seen:06-14-20 Last ordered:01-31-21

## 2021-04-20 ENCOUNTER — Other Ambulatory Visit: Payer: Self-pay

## 2021-04-20 ENCOUNTER — Ambulatory Visit (INDEPENDENT_AMBULATORY_CARE_PROVIDER_SITE_OTHER): Payer: Medicare Other | Admitting: Nurse Practitioner

## 2021-04-20 ENCOUNTER — Ambulatory Visit (INDEPENDENT_AMBULATORY_CARE_PROVIDER_SITE_OTHER): Payer: Medicare Other

## 2021-04-20 ENCOUNTER — Encounter (INDEPENDENT_AMBULATORY_CARE_PROVIDER_SITE_OTHER): Payer: Self-pay | Admitting: Nurse Practitioner

## 2021-04-20 VITALS — BP 103/67 | HR 84 | Ht 65.0 in | Wt 147.0 lb

## 2021-04-20 DIAGNOSIS — I6523 Occlusion and stenosis of bilateral carotid arteries: Secondary | ICD-10-CM | POA: Diagnosis not present

## 2021-04-20 DIAGNOSIS — I4819 Other persistent atrial fibrillation: Secondary | ICD-10-CM

## 2021-04-20 DIAGNOSIS — E782 Mixed hyperlipidemia: Secondary | ICD-10-CM | POA: Diagnosis not present

## 2021-04-21 ENCOUNTER — Encounter (INDEPENDENT_AMBULATORY_CARE_PROVIDER_SITE_OTHER): Payer: Self-pay | Admitting: Nurse Practitioner

## 2021-04-21 NOTE — Progress Notes (Signed)
Subjective:    Patient ID: [name], female    DOB: November 13, 1941, 80 y.o.   MRN: [id number] Chief Complaint  Patient presents with  . Follow-up  . Carotid    1 yr U/S    The patient is seen for follow up evaluation of carotid stenosis. The carotid stenosis followed by ultrasound.   The patient denies amaurosis fugax. There is no recent history of TIA symptoms or focal motor deficits. There is no prior documented CVA.  The patient is taking enteric-coated aspirin 81 mg daily.  There is no history of migraine headaches. There is no history of seizures.  The patient has a history of coronary artery disease, no recent episodes of angina or shortness of breath. The patient denies PAD or claudication symptoms. There is a history of hyperlipidemia which is being treated with a statin.    Carotid Duplex done today shows 1 to 39% bilaterally.  Previously it was noted at 40 to 59% bilaterally.   Review of Systems  Neurological: Positive for seizures.  All other systems reviewed and are negative.      Objective:   Physical Exam Vitals reviewed.  HENT:     Head: Normocephalic.  Neck:     Vascular: No carotid bruit.  Cardiovascular:     Rate and Rhythm: Normal rate.     Pulses: Normal pulses.  Pulmonary:     Effort: Pulmonary effort is normal.  Neurological:     Mental Status: She is alert and oriented to person, place, and time.  Psychiatric:        Mood and Affect: Mood normal.        Behavior: Behavior normal.        Thought Content: Thought content normal.        Judgment: Judgment normal.     BP 103/67   Pulse 84   Ht 5\' 5"  (1.651 m)   Wt 147 lb (66.7 kg)   BMI 24.46 kg/m   Past Medical History:  Diagnosis Date  . Acute on chronic diastolic (congestive) heart failure ([place])   . Allergy    See list  . Anemia 2018  . Anxiety    Occasionally take Xanax for sleep  . Anxiety associated with depression    Prn alprazolam   . Anxiety associated with  depression   . Arthritis    Hands, Back  . Atrial fibrillation, persistent ([place])    DCCV 08/22/2015  . Bradycardia post-op bradycardia, pacer dependent   MDT PPM 11/06/15, Dr. Lovena Le  . Cataract    Left eye  . Diverticulosis   . Focal seizures ([place])   . Heart murmur   . Hemorrhoid   . Hepatic cyst    innumerable  . 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 Hyperlipidemia   . IBS (irritable bowel syndrome)   . 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 Migraine   . MVP (mitral valve prolapse)   . Nodule of right lung   . Osteoporosis of forearm   . RBBB   . Restrictive lung disease    Mild on PFT & likely cardiac in etiology   . S/P Minimally invasive maze operation for atrial fibrillation 10/31/2015   Complete bilateral atrial lesion set using cryothermy and bipolar radiofrequency ablation with clipping of LA appendage via right mini thoracotomy approach  . S/P minimally invasive mitral valve replacement with bioprosthetic valve 10/31/2015   33 mm [facility] Mitral bovine bioprosthetic tissue valve placed via  right mini thoracotomy approach  . Severe mitral regurgitation   . 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 SVT (supraventricular tachycardia) ([place])   . Thoracic aorta atherosclerosis ([place])   . TIA (transient ischemic attack)     Social History   Socioeconomic History  . Marital status: Widowed    Spouse name: Deceased  . Number of children: 2  . Years of education: 48  . Highest education level: Not on file  Occupational History  . Occupation: Retired  Tobacco Use  . Smoking status: Never Smoker  . Smokeless tobacco: Never Used  Vaping Use  . Vaping Use: Never used  Substance and Sexual Activity  . Alcohol use: No    Alcohol/week: 3.0 standard drinks    Types: 3 Standard drinks or equivalent per week  . Drug use: No  . Sexual activity: Never  Other Topics Concern  . Not on file  Social History Narrative   She is a widow.  Husband died  from metastatic renal cell cancer. Originally from [place]. Previously lived in [place] from 1975-[place]. Moved to [place] in 2007. No mold exposure recently but did have it through a prior work exposure in 1993. Has a masters in public health. No bird exposure.      [facility] (09/29/17):   She has moved into a retirement community since last appointment. She reports she had testing at her new residence that was positive for mold. It has since been treated.    Social Determinants of Health   Financial Resource Strain: Not on file  Food Insecurity: Not on file  Transportation Needs: Not on file  Physical Activity: Not on file  Stress: Not on file  Social Connections: Not on file  Intimate Partner Violence: Not on file    Past Surgical History:  Procedure Laterality Date  . BREAST EXCISIONAL BIOPSY Right Late 80s   Negative X2  . CARDIAC CATHETERIZATION N/A 10/18/2015   Procedure: Right/Left Heart Cath and Coronary Angiography;  Surgeon: Sherren Mocha, MD;  Location: [facility];  Service: Cardiovascular;  Laterality: N/A;  . CARDIOVERSION N/A 08/22/2015   Procedure: CARDIOVERSION;  Surgeon: Thayer Headings, MD;  Location: [facility];  Service: Cardiovascular;  Laterality: N/A;  . COLONOSCOPY  2003  . EP IMPLANTABLE DEVICE N/A 11/06/2015   Procedure: Pacemaker Implant;  Surgeon: Evans Lance, MD;  Location: [facility];  Service: Cardiovascular;  Laterality: N/A;  . EP IMPLANTABLE DEVICE N/A 02/20/2016   Procedure: Lead Extraction;  Surgeon: Evans Lance, MD;  Location: [facility];  Service: Cardiovascular;  Laterality: N/A;  . EYE SURGERY Right 2013  . FOOT SURGERY    . MANDIBLE FRACTURE SURGERY  03-26-13  . MINIMALLY INVASIVE MAZE PROCEDURE N/A 10/31/2015   Procedure: MINIMALLY INVASIVE MAZE PROCEDURE;  Surgeon: Rexene Alberts, MD;  Location: [facility];  Service: Open Heart Surgery;  Laterality: N/A;  . MITRAL VALVE REPLACEMENT Right 10/31/2015   Procedure:  MINIMALLY INVASIVE MITRAL VALVE (MV) REPLACEMENT;  Surgeon: Rexene Alberts, MD;  Location: [facility];  Service: Open Heart Surgery;  Laterality: Right;  . PACEMAKER LEAD REMOVAL  02/20/2016  . TEE WITH CARDIOVERSION    . TEE WITHOUT CARDIOVERSION N/A 08/22/2015   Procedure: TRANSESOPHAGEAL ECHOCARDIOGRAM (TEE);  Surgeon: Thayer Headings, MD;  Location: [facility];  Service: Cardiovascular;  Laterality: N/A;  . TEE WITHOUT CARDIOVERSION N/A 10/31/2015   Procedure: TRANSESOPHAGEAL ECHOCARDIOGRAM (TEE);  Surgeon: Rexene Alberts,  MD;  [facility];  Service: Open Heart Surgery;  Laterality: N/A;  . TUBAL LIGATION    . VARICOSE VEIN SURGERY Right     Family History  Problem Relation Age of Onset  . Hypertension Mother   . Arrhythmia Mother   . Heart failure Mother   . Arrhythmia Brother   . Stroke Brother 20       cerebral hemorrhage, nonsmoker, no HTN  . Prostate cancer Brother   . Stroke Father        from an aneurysm  . Stroke Maternal Aunt 83       cerebral hemorrhage  . Liver cancer Maternal Grandmother   . Atrial fibrillation Son   . Celiac disease Son   . Heart attack Neg Hx   . Breast cancer Neg Hx     Allergies  Allergen Reactions  . Epinephrine Palpitations  . Meloxicam Other (See Comments)    Severe reflux  . Codeine Nausea And Vomiting    migraine  . Dextromethorphan     seizures  . Diphenhydramine Other (See Comments)    seizure  . Diphenhydramine Hcl     Other reaction(s): Other (See Comments)  . Doxycycline Itching and Swelling    Facial  . Hydrocodone Nausea And Vomiting    MIGRAINE  . Hydromorphone Nausea And Vomiting    migraine  . Oxycodone Nausea And Vomiting    Severe migraine  . Propofol Other (See Comments)    CBC Latest Ref Rng & Units 04/12/2021 01/25/2021 06/14/2020  WBC 4.0 - 10.5 K/uL 4.4 5.4 4.7  Hemoglobin 12.0 - 15.0 g/dL 11.2(L) 11.7 11.1(L)  Hematocrit 36.0 - 46.0 % 32.9(L) 34.5 32.8(L)  Platelets 150.0 - 400.0 K/uL 105.0(L) 116(L)  120.0(L)      CMP     Component Value Date/Time   NA 136 04/10/2021 1416   NA 140 03/26/2021 1526   K 3.9 04/10/2021 1416   CL 100 04/10/2021 1416   CO2 29 04/10/2021 1416   GLUCOSE 98 04/10/2021 1416   BUN 19 04/10/2021 1416   BUN 25 03/26/2021 1526   CREATININE 0.99 04/10/2021 1416   CREATININE 0.90 02/26/2018 0818   CALCIUM 9.1 04/10/2021 1416   PROT 6.2 06/14/2020 1617   PROT 5.8 (L) 12/05/2014 0848   ALBUMIN 4.6 06/14/2020 1617   ALBUMIN 4.0 12/05/2014 0848   AST 21 06/14/2020 1617   ALT 13 06/14/2020 1617   ALKPHOS 75 06/14/2020 1617   BILITOT 0.3 06/14/2020 1617   GFRNONAA 58 (L) 04/10/2021 1416   GFRAA 63 01/25/2021 1417     No results found.     Assessment & Plan:   1. Bilateral carotid artery stenosis Recommend:  Given the patient's asymptomatic subcritical stenosis no further invasive testing or surgery at this time.  Duplex ultrasound shows less than 40% stenosis bilaterally.  Continue antiplatelet therapy as prescribed Continue management of CAD, HTN and Hyperlipidemia Healthy heart diet,  encouraged exercise at least 4 times per week Follow up in 12 months with duplex ultrasound and physical exam   2. Mixed hyperlipidemia Continue statin as ordered and reviewed, no changes at this time   3. Atrial fibrillation, persistent ([place]) Continue antiarrhythmia medications as already ordered, these medications have been reviewed and there are no changes at this time.  Continue anticoagulation as ordered by Cardiology Service    Current Outpatient Medications on File Prior to Visit  Medication Sig Dispense Refill  . amoxicillin (AMOXIL) 500 MG tablet Take 4 tablets  60 minutes prior to dental procedures 30 tablet 6  . Ascorbic Acid (VITAMIN C) 1000 MG tablet Take 1 tablet by mouth daily.    Marland Kitchen azelastine (ASTELIN) 0.1 % nasal spray TAKE 2 SPRAYS INTO BOTH NOSTRILS TWICE DAILY AS DIRECTED 30 mL 12  . BRIVIACT 25 MG TABS tablet Take one tablet (25MG ) by  mouth in the AM and two tablets (50MG ) by mouth in the PM.    . butalbital-acetaminophen-caffeine (FIORICET) 50-325-40 MG tablet TAKE ONE TABLET BY MOUTH DAILY AS NEEDED FOR BACK PAIN OR MIGRAINE HEADACHE. MAX OF 3 TABLETS DAILY 90 tablet 3  . Cholecalciferol (VITAMIN D) 2000 units CAPS Take 2,000 Units by mouth daily.    Marland Kitchen ELIQUIS 5 MG TABS tablet TAKE ONE TABLET TWICE DAILY 180 tablet 1  . fluticasone (FLONASE) 50 MCG/ACT nasal spray USE 2 SPRAYS IN BOTH NOSTRILS DAILY 16 g 11  . Folic Acid 0.8 MG CAPS Take 0.8 mg by mouth daily.    . furosemide (LASIX) 40 MG tablet Take one tablet twice per week. Take an additional 0.5-1 tablet as needed for fluid retention. 30 tablet 11  . hydroquinone 4 % cream Apply topically in the morning and at bedtime.     . lamoTRIgine (LAMICTAL) 150 MG tablet Take 150 mg by mouth 2 (two) times daily.    Marland Kitchen losartan (COZAAR) 25 MG tablet Take 0.5 tablets (12.5 mg total) by mouth daily. 45 tablet 3  . metoprolol succinate (TOPROL XL) 25 MG 24 hr tablet Take 1 tablet (25 mg total) by mouth daily. 90 tablet 2  . montelukast (SINGULAIR) 10 MG tablet TAKE ONE TABLET BY MOUTH AT BEDTIME 90 tablet 1  . polyethylene glycol powder (GLYCOLAX/MIRALAX) 17 GM/SCOOP powder Take 1 Dose by mouth daily.    . rosuvastatin (CRESTOR) 40 MG tablet TAKE ONE TABLET EVERY DAY 90 tablet 1  . spironolactone (ALDACTONE) 25 MG tablet Take 0.5 tablets (12.5 mg total) by mouth every other day.    . triamcinolone cream (KENALOG) 0.1 % Apply 1 application topically 2 (two) times daily. 30 g 0  . VENTOLIN HFA 108 (90 Base) MCG/ACT inhaler Inhale 2 puffs into the lungs as needed.     . vitamin B-12 (CYANOCOBALAMIN) 1000 MCG tablet Take 1,000 mcg by mouth daily.     No current facility-administered medications on file prior to visit.    There are no Patient Instructions on file for this visit. No follow-ups on file.   Kris Hartmann, NP

## 2021-04-24 ENCOUNTER — Encounter: Payer: Self-pay | Admitting: Internal Medicine

## 2021-04-24 ENCOUNTER — Ambulatory Visit (INDEPENDENT_AMBULATORY_CARE_PROVIDER_SITE_OTHER): Payer: Medicare Other | Admitting: Internal Medicine

## 2021-04-24 ENCOUNTER — Ambulatory Visit: Payer: Medicare Other | Admitting: Cardiovascular Disease

## 2021-04-24 ENCOUNTER — Other Ambulatory Visit: Payer: Self-pay

## 2021-04-24 VITALS — BP 108/64 | HR 61 | Temp 95.9°F | Resp 15 | Ht 65.0 in | Wt 150.2 lb

## 2021-04-24 DIAGNOSIS — I6523 Occlusion and stenosis of bilateral carotid arteries: Secondary | ICD-10-CM

## 2021-04-24 DIAGNOSIS — Z7189 Other specified counseling: Secondary | ICD-10-CM | POA: Diagnosis not present

## 2021-04-24 DIAGNOSIS — Z711 Person with feared health complaint in whom no diagnosis is made: Secondary | ICD-10-CM | POA: Diagnosis not present

## 2021-04-24 NOTE — Patient Instructions (Addendum)
  I'm glad you came in today so that we were able to codify your end of life objectives with your MOST form   We will repeat your labs in 6 months . Everything is stable

## 2021-04-24 NOTE — Progress Notes (Signed)
Subjective:  Patient ID: [name] [name], female    DOB: 07-Apr-1941  Age: 80 y.o. MRN: [id number]  CC: The primary encounter diagnosis was DNR (do not resuscitate) discussion. A diagnosis of Concern about end of life was also pertinent to this visit.  HPI [name] [name] presents for  Discussion  Of end  of life objectives.  This visit occurred during the SARS-CoV-2 public health emergency.  Safety protocols were in place, including screening questions prior to the visit, additional usage of staff PPE, and extensive cleaning of exam room while observing appropriate contact time as indicated for disinfecting solutions.   Patient has been considering the choices on the MOST form and has decided to codify her wishes.  Long discussion about her husband's and mother's deaths.  She is not depressed and has no terminal illness .  She [facility] at [facility] and has been widowed since 2011 . She has not discussed her MOST form with her daughter  Reviewed recent hip films and orthopedics consult planned for tomorrow for non surgical options  Right sided vaginal pain associated with right inguinal hernia.    Wants to avoid surgery due to cognitive changes after heart surgery and general anesthesia.  Has seen  Neurology .  Had another seizure involving the  Left leg after receiving  Local anethesia / lidocaine for a dental procedure . Paying $1300 to 400 /month for seizure medication      Outpatient Medications Prior to Visit  Medication Sig Dispense Refill  . amoxicillin (AMOXIL) 500 MG tablet Take 4 tablets 60 minutes prior to dental procedures 30 tablet 6  . Ascorbic Acid (VITAMIN C) 1000 MG tablet Take 1 tablet by mouth daily.    Marland Kitchen azelastine (ASTELIN) 0.1 % nasal spray TAKE 2 SPRAYS INTO BOTH NOSTRILS TWICE DAILY AS DIRECTED 30 mL 12  . BRIVIACT 25 MG TABS tablet Take one tablet (25MG ) by mouth in the AM and two tablets (50MG ) by mouth in the PM.    . butalbital-acetaminophen-caffeine (FIORICET)  50-325-40 MG tablet TAKE ONE TABLET BY MOUTH DAILY AS NEEDED FOR BACK PAIN OR MIGRAINE HEADACHE. MAX OF 3 TABLETS DAILY 90 tablet 3  . Cholecalciferol (VITAMIN D) 2000 units CAPS Take 2,000 Units by mouth daily.    Marland Kitchen ELIQUIS 5 MG TABS tablet TAKE ONE TABLET TWICE DAILY 180 tablet 1  . fluticasone (FLONASE) 50 MCG/ACT nasal spray USE 2 SPRAYS IN BOTH NOSTRILS DAILY 16 g 11  . Folic Acid 0.8 MG CAPS Take 0.8 mg by mouth daily.    . furosemide (LASIX) 40 MG tablet Take one tablet twice per week. Take an additional 0.5-1 tablet as needed for fluid retention. 30 tablet 11  . hydroquinone 4 % cream Apply topically in the morning and at bedtime.     . lamoTRIgine (LAMICTAL) 150 MG tablet Take 150 mg by mouth 2 (two) times daily.    Marland Kitchen losartan (COZAAR) 25 MG tablet Take 0.5 tablets (12.5 mg total) by mouth daily. 45 tablet 3  . metoprolol succinate (TOPROL XL) 25 MG 24 hr tablet Take 1 tablet (25 mg total) by mouth daily. 90 tablet 2  . montelukast (SINGULAIR) 10 MG tablet TAKE ONE TABLET BY MOUTH AT BEDTIME 90 tablet 1  . polyethylene glycol powder (GLYCOLAX/MIRALAX) 17 GM/SCOOP powder Take 1 Dose by mouth daily.    . rosuvastatin (CRESTOR) 40 MG tablet TAKE ONE TABLET EVERY DAY 90 tablet 1  . spironolactone (ALDACTONE) 25 MG tablet Take 0.5 tablets (  12.5 mg total) by mouth every other day.    . triamcinolone cream (KENALOG) 0.1 % Apply 1 application topically 2 (two) times daily. 30 g 0  . VENTOLIN HFA 108 (90 Base) MCG/ACT inhaler Inhale 2 puffs into the lungs as needed.     . vitamin B-12 (CYANOCOBALAMIN) 1000 MCG tablet Take 1,000 mcg by mouth daily.     No facility-administered medications prior to visit.    Review of Systems;  Patient denies headache, fevers, malaise, unintentional weight loss, skin rash, eye pain, sinus congestion and sinus pain, sore throat, dysphagia,  hemoptysis , cough, dyspnea, wheezing, chest pain, palpitations, orthopnea, edema, abdominal pain, nausea, melena, diarrhea,  constipation, flank pain, dysuria, hematuria, urinary  Frequency, nocturia, numbness, tingling, seizures,  Focal weakness, Loss of consciousness,  Tremor, insomnia, depression, anxiety, and suicidal ideation.      Objective:  BP 108/64 (BP Location: Left Arm, Patient Position: Sitting, Cuff Size: Normal)   Pulse 61   Temp (!) 95.9 F (35.5 C) (Temporal)   Resp 15   Ht 5\' 5"  (1.651 m)   Wt 150 lb 3.2 oz (68.1 kg)   SpO2 99%   BMI 24.99 kg/m   BP Readings from Last 3 Encounters:  04/24/21 108/64  04/20/21 103/67  04/12/21 110/60    Wt Readings from Last 3 Encounters:  04/24/21 150 lb 3.2 oz (68.1 kg)  04/20/21 147 lb (66.7 kg)  04/12/21 148 lb 9.6 oz (67.4 kg)    General appearance: alert, cooperative and appears stated age Lungs: clear to auscultation bilaterally Heart: regular rate and rhythm, S1, S2 normal, no murmur, click, rub or gallop Abdomen: soft, non-tender; bowel sounds normal; no masses,  no organomegaly Pulses: 2+ and symmetric Skin: Skin color, texture, turgor normal. No rashes or lesions Lymph nodes: Cervical, supraclavicular, and axillary nodes normal. Psych: affect normal, makes good eye contact. No fidgeting,  Smiles easily.  Speech is calm and articulate. Denies suicidal thoughts   Lab Results  Component Value Date   HGBA1C 5.5 02/11/2020   HGBA1C 5.7 05/21/2019   HGBA1C 5.3 04/07/2018    Lab Results  Component Value Date   CREATININE 0.99 04/10/2021   CREATININE 1.54 (H) 03/26/2021   CREATININE 1.07 (H) 03/05/2021    Lab Results  Component Value Date   WBC 4.4 04/12/2021   HGB 11.2 (L) 04/12/2021   HCT 32.9 (L) 04/12/2021   PLT 105.0 (L) 04/12/2021   GLUCOSE 98 04/10/2021   CHOL 143 05/21/2019   TRIG 60.0 05/21/2019   HDL 59.50 05/21/2019   LDLDIRECT 113.0 02/15/2016   LDLCALC 71 05/21/2019   ALT 13 06/14/2020   AST 21 06/14/2020   NA 136 04/10/2021   K 3.9 04/10/2021   CL 100 04/10/2021   CREATININE 0.99 04/10/2021   BUN 19  04/10/2021   CO2 29 04/10/2021   TSH 4.24 05/21/2019   INR 1.11 05/06/2017   HGBA1C 5.5 02/11/2020    DG Lumbar Spine Complete  Result Date: 04/14/2021 CLINICAL DATA:  Low back pain and left hip pain greater than 1 year. Fall in March 2022 with worsening of pain. EXAM: LUMBAR SPINE - COMPLETE 4+ VIEW COMPARISON:  02/02/2015 FINDINGS: Stable moderate curvature of the lumbar spine convex right. There is moderate spondylosis throughout the lumbar spine. There is no acute compression fracture. Moderate multilevel disc disease most prominent over the T12-L1 and L1-2 levels. No significant change from the prior exam. IMPRESSION: 1. No acute findings. 2. Moderate spondylosis of the lumbar spine  with multilevel disc disease most prominent over the T12-L1 and L1-2 levels. Electronically Signed   By: Marin Olp M.D.   On: 04/14/2021 10:59   DG Hip Unilat W OR W/O Pelvis 2-3 Views Left  Result Date: 04/14/2021 CLINICAL DATA:  Low back and left hip pain greater than 1 year. Recent fall in March with worsening of pain. EXAM: DG HIP (WITH OR WITHOUT PELVIS) 2-3V LEFT COMPARISON:  None. FINDINGS: Mild symmetric degenerative change of the hips. No acute fracture or dislocation. Minimal degenerate change of the sacroiliac joints. Degenerative change of the spine. IMPRESSION: No acute findings. Electronically Signed   By: Marin Olp M.D.   On: 04/14/2021 11:00   DG Hip Unilat W OR W/O Pelvis 2-3 Views Right  Result Date: 04/14/2021 CLINICAL DATA:  Left hip pain greater than 1 year worse since fall in March. Recent right hip pain. EXAM: DG HIP (WITH OR WITHOUT PELVIS) 2-3V RIGHT COMPARISON:  None. FINDINGS: There mild symmetric degenerative changes of the hips. No evidence of acute fracture or dislocation. No focal bony abnormality. There is mild degenerative change of the sacroiliac joints and degenerative change of the spine. IMPRESSION: No acute findings. Electronically Signed   By: Marin Olp M.D.   On:  04/14/2021 11:01    Assessment & Plan:   Problem List Items Addressed This Visit      Unprioritized   Concern about end of life    A total of 40 minutes was spent with patient more than half of which was spent in reviewing patient's motivations and end of life wishes, her current  emotional state and reviewing the implications of the options she has chosen for her MOST form.  She has chosen the middle choice for all therapeutic choices.        Other Visit Diagnoses    DNR (do not resuscitate) discussion    -  Primary   Relevant Orders   DNR (Do Not Resuscitate)      I am having [name] [name] maintain her Vitamin D, Ventolin HFA, triamcinolone cream, vitamin C, polyethylene glycol powder, lamoTRIgine, Briviact, fluticasone, hydroquinone, amoxicillin, azelastine, vitamin F-29, Folic Acid, montelukast, Eliquis, metoprolol succinate, rosuvastatin, furosemide, losartan, spironolactone, and butalbital-acetaminophen-caffeine.  No orders of the defined types were placed in this encounter.   There are no discontinued medications.  Follow-up: No follow-ups on file.   [name], MD

## 2021-04-24 NOTE — Assessment & Plan Note (Addendum)
A total of 40 minutes was spent with patient more than half of which was spent in reviewing patient's motivations and end of life wishes, her current  emotional state and reviewing the implications of the options she has chosen for her MOST form.  She has chosen the middle choice for all therapeutic choices.

## 2021-05-07 ENCOUNTER — Other Ambulatory Visit: Payer: Self-pay | Admitting: Internal Medicine

## 2021-05-09 ENCOUNTER — Ambulatory Visit: Payer: Medicare Other | Admitting: Internal Medicine

## 2021-05-24 ENCOUNTER — Other Ambulatory Visit: Payer: Self-pay | Admitting: Internal Medicine

## 2021-05-29 ENCOUNTER — Ambulatory Visit (INDEPENDENT_AMBULATORY_CARE_PROVIDER_SITE_OTHER): Payer: Medicare Other

## 2021-05-29 DIAGNOSIS — I495 Sick sinus syndrome: Secondary | ICD-10-CM | POA: Diagnosis not present

## 2021-05-29 LAB — CUP PACEART REMOTE DEVICE CHECK
Battery Impedance: 578 Ohm
Battery Remaining Longevity: 71 mo
Battery Voltage: 2.79 V
Brady Statistic AP VP Percent: 98 %
Brady Statistic AP VS Percent: 0 %
Brady Statistic AS VP Percent: 2 %
Brady Statistic AS VS Percent: 0 %
Date Time Interrogation Session: 20220628100423
Implantable Lead Implant Date: 20161205
Implantable Lead Implant Date: 20170321
Implantable Lead Location: 753859
Implantable Lead Location: 753860
Implantable Lead Model: 5076
Implantable Lead Model: 5076
Implantable Pulse Generator Implant Date: 20161205
Lead Channel Impedance Value: 434 Ohm
Lead Channel Impedance Value: 801 Ohm
Lead Channel Pacing Threshold Amplitude: 0.625 V
Lead Channel Pacing Threshold Amplitude: 0.625 V
Lead Channel Pacing Threshold Pulse Width: 0.4 ms
Lead Channel Pacing Threshold Pulse Width: 0.4 ms
Lead Channel Setting Pacing Amplitude: 2 V
Lead Channel Setting Pacing Amplitude: 2.5 V
Lead Channel Setting Pacing Pulse Width: 0.4 ms
Lead Channel Setting Sensing Sensitivity: 4 mV

## 2021-06-05 ENCOUNTER — Encounter: Payer: Self-pay | Admitting: Cardiovascular Disease

## 2021-06-05 ENCOUNTER — Other Ambulatory Visit: Payer: Self-pay

## 2021-06-05 ENCOUNTER — Ambulatory Visit (INDEPENDENT_AMBULATORY_CARE_PROVIDER_SITE_OTHER): Payer: Medicare Other | Admitting: Cardiovascular Disease

## 2021-06-05 VITALS — BP 104/60 | HR 79 | Ht 65.0 in | Wt 149.0 lb

## 2021-06-05 DIAGNOSIS — I5022 Chronic systolic (congestive) heart failure: Secondary | ICD-10-CM | POA: Diagnosis not present

## 2021-06-05 DIAGNOSIS — Z950 Presence of cardiac pacemaker: Secondary | ICD-10-CM

## 2021-06-05 DIAGNOSIS — I4819 Other persistent atrial fibrillation: Secondary | ICD-10-CM

## 2021-06-05 DIAGNOSIS — E782 Mixed hyperlipidemia: Secondary | ICD-10-CM

## 2021-06-05 DIAGNOSIS — I6523 Occlusion and stenosis of bilateral carotid arteries: Secondary | ICD-10-CM

## 2021-06-05 DIAGNOSIS — Z952 Presence of prosthetic heart valve: Secondary | ICD-10-CM

## 2021-06-05 DIAGNOSIS — I272 Pulmonary hypertension, unspecified: Secondary | ICD-10-CM

## 2021-06-05 DIAGNOSIS — I442 Atrioventricular block, complete: Secondary | ICD-10-CM

## 2021-06-05 DIAGNOSIS — I700 Atherosclerosis of aorta: Secondary | ICD-10-CM

## 2021-06-05 NOTE — Patient Instructions (Addendum)
Medication Instructions:  No changes  Read about farxiga/jardiance for cardiomyopathy Call if you would like [place] to start  If you need a refill on your cardiac medications before your next appointment, please call your pharmacy.    Lab work: No new labs needed  Testing/Procedures: No new testing needed   Follow-Up: At [facility], you and your health needs are our priority.  As part of our continuing mission to provide you with exceptional heart care, we have created designated Provider Care Teams.  These Care Teams include your primary Cardiologist (physician) and Advanced Practice Providers (APPs -  Physician Assistants and Nurse Practitioners) who all work together to provide you with the care you need, when you need it.  You will need a follow up appointment in 6 months  Providers on your designated Care Team:   Murray Hodgkins, NP Christell Faith, PA-C Marrianne Mood, PA-C Cadence Conyngham, [place]   COVID-19 Vaccine Information can be found at: [email or web address].uk For questions related to vaccine distribution or appointments, please email [email or web address] or call [phone].

## 2021-06-05 NOTE — Progress Notes (Signed)
Cardiology Office Note  Date:  06/05/2021   ID:  Jamesetta, [place] 21-Sep-1941, MRN [id number]  PCP:  [name], MD   Chief Complaint  Patient presents with   2 month follow up     "Doing well." Medications reviewed by the patient verbally.     HPI:  Ms [name] [name] is a 80 yo woman with past medical history of Supraventricular tachycardia, prior ablation Mitral valve prolapse - s/p mitral valve replacement  Paroxysmal Afib, on eliquis Cardiac cath 2016: no CAD MVR, maze 2017, dr. 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  01/2021: fluid retention Started on spironolactone 12.5 Did not tolerate entresto Secondary to low blood pressure  Recently Went on a trip, up lots of stairs, last step, injured hip Developed Hip pain,  xray: Minimal degenerate change of the sacroiliac joints. Degenerative change of the spine.  Was using a cane Pain better today, limping Doing PT, scheduled to see orthopedics  Followed by Dr. Manuella Ghazi, for seizures Doing well on lamictal BID, briviact BID  EKG personally reviewed by myself on todays visit Shows paced rhythm rate 79 bpm  Prior cardiac imaging reviewed Echo 2018: ejection fraction was  in the range of 40% to 45%.  Hypokinesis of the anteroseptal,  inferior, and inferoseptal myocardium.  Mitral: bioprosthesis was present and functioning   normally Left atrium: The atrium was severely dilated.  Systolic pressure was mildly increased. PA  peak pressure: 40 mm Hg (S).   Carotid u/s 40 to 59% b;l disease  Labs reviewed: Normal BMP, CBC HBA1C 5.5   PMH:   has a past medical  history of Acute on chronic diastolic (congestive) heart failure ([place]), Allergy, Anemia (2018), Anxiety, Anxiety associated with depression, Anxiety associated with depression, Arthritis, Atrial fibrillation, persistent (HCC), Bradycardia (post-op bradycardia, pacer dependent), Cataract, Diverticulosis, Focal seizures ([place]), Heart murmur, Hemorrhoid, Hepatic cyst, History of asbestos exposure, Hyperlipidemia, IBS (irritable bowel syndrome), Idiopathic thrombocytopenic purpura (ITP) (HCC), Migraine, MVP (mitral valve prolapse), Nodule of right lung, Osteoporosis of forearm, RBBB, Restrictive lung disease, S/P Minimally invasive maze operation for atrial fibrillation (10/31/2015), 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  PSH:    Past Surgical History:  Procedure Laterality Date   BREAST EXCISIONAL BIOPSY Right Late 80s   Negative X2   CARDIAC CATHETERIZATION N/A 10/18/2015   Procedure: Right/Left Heart Cath and Coronary Angiography;  Surgeon: Sherren Mocha, MD;  Location: [facility];  Service: Cardiovascular;  Laterality: N/A;   CARDIOVERSION N/A 08/22/2015   Procedure: CARDIOVERSION;  Surgeon: Thayer Headings, MD;  Location: [facility] ENDOSCOPY;  Service: Cardiovascular;  Laterality: N/A;   COLONOSCOPY  2003   EP IMPLANTABLE DEVICE N/A 11/06/2015   Procedure: Pacemaker Implant;  Surgeon: Evans Lance, MD;  Location: [facility];  Service: Cardiovascular;  Laterality: N/A;   EP IMPLANTABLE DEVICE N/A 02/20/2016   Procedure: Lead Extraction;  Surgeon: Evans Lance, MD;  Location: [facility];  Service: Cardiovascular;  Laterality: N/A;   EYE SURGERY Right 2013  FOOT SURGERY     MANDIBLE FRACTURE SURGERY  03-26-13   MINIMALLY INVASIVE MAZE PROCEDURE N/A 10/31/2015    Procedure: MINIMALLY INVASIVE MAZE PROCEDURE;  Surgeon: Rexene Alberts, MD;  Location: [facility];  Service: Open Heart Surgery;  Laterality: N/A;   MITRAL VALVE REPLACEMENT Right 10/31/2015   Procedure: MINIMALLY INVASIVE MITRAL VALVE (MV) REPLACEMENT;  Surgeon: Rexene Alberts, MD;  Location: [facility];  Service: Open Heart Surgery;  Laterality: Right;   PACEMAKER LEAD REMOVAL  02/20/2016   TEE WITH CARDIOVERSION     TEE WITHOUT CARDIOVERSION N/A 08/22/2015   Procedure: TRANSESOPHAGEAL ECHOCARDIOGRAM (TEE);  Surgeon: Thayer Headings, MD;  Location: [facility];  Service: Cardiovascular;  Laterality: N/A;   TEE WITHOUT CARDIOVERSION N/A 10/31/2015   Procedure: TRANSESOPHAGEAL ECHOCARDIOGRAM (TEE);  Surgeon: Rexene Alberts, MD;  Location: [facility];  Service: Open Heart Surgery;  Laterality: N/A;   TUBAL LIGATION     VARICOSE VEIN SURGERY Right     Current Outpatient Medications  Medication Sig Dispense Refill   azelastine (ASTELIN) 0.1 % nasal spray TAKE 2 SPRAYS INTO BOTH NOSTRILS TWICE DAILY AS DIRECTED 30 mL 12   BRIVIACT 25 MG TABS tablet Take one tablet (25MG ) by mouth in the AM and two tablets (50MG ) by mouth in the PM.     butalbital-acetaminophen-caffeine (FIORICET) 50-325-40 MG tablet TAKE ONE TABLET BY MOUTH DAILY AS NEEDED FOR BACK PAIN OR MIGRAINE HEADACHE. MAX OF 3 TABLETS DAILY 90 tablet 3   Cholecalciferol (VITAMIN D) 2000 units CAPS Take 2,000 Units by mouth daily.     ELIQUIS 5 MG TABS tablet TAKE ONE TABLET TWICE DAILY 180 tablet 1   fluticasone (FLONASE) 50 MCG/ACT nasal spray USE 2 SPRAYS IN BOTH NOSTRILS DAILY 16 g 11   Folic Acid 0.8 MG CAPS Take 0.8 mg by mouth daily.     furosemide (LASIX) 40 MG tablet Take one tablet twice per week. Take an additional 0.5-1 tablet as needed for fluid retention. 30 tablet 11   hydroquinone 4 % cream Apply topically in the morning and at bedtime.      lamoTRIgine (LAMICTAL) 150 MG tablet Take 150 mg by mouth 2 (two) times daily.     losartan  (COZAAR) 25 MG tablet Take 0.5 tablets (12.5 mg total) by mouth daily. 45 tablet 3   metoprolol succinate (TOPROL XL) 25 MG 24 hr tablet Take 1 tablet (25 mg total) by mouth daily. 90 tablet 2   montelukast (SINGULAIR) 10 MG tablet TAKE ONE TABLET BY MOUTH AT BEDTIME 90 tablet 1   polyethylene glycol powder (GLYCOLAX/MIRALAX) 17 GM/SCOOP powder Take 1 Dose by mouth daily.     rosuvastatin (CRESTOR) 40 MG tablet TAKE ONE TABLET EVERY DAY 90 tablet 1   spironolactone (ALDACTONE) 25 MG tablet Take 0.5 tablets (12.5 mg total) by mouth every other day.     triamcinolone cream (KENALOG) 0.1 % Apply 1 application topically 2 (two) times daily. 30 g 0   VENTOLIN HFA 108 (90 Base) MCG/ACT inhaler INHALE 2 PUFFS INTO THE LUNGS EVERY 6 HOURS AS NEEDED FOR WHEEZING OR SHORTNESS OF BREATH 18 g 1   vitamin B-12 (CYANOCOBALAMIN) 1000 MCG tablet Take 1,000 mcg by mouth daily.     amoxicillin (AMOXIL) 500 MG tablet Take 4 tablets 60 minutes prior to dental procedures (Patient not taking: Reported on 06/05/2021) 30 tablet 6   No current facility-administered medications for this visit.     Allergies:   Acetaminophen-codeine, Codeine, Epinephrine, Hydrocodone, Hydromorphone,  Oxycodone, Meloxicam, Dextromethorphan, Diphenhydramine, Diphenhydramine hcl, Diphenylpyraline, Doxycycline, Meloxicam, Nsaids, and Propofol   Social History:  The patient  reports that she has never smoked. She has never used smokeless tobacco. She reports that she does not drink alcohol and does not use drugs.   Family History:   family history includes Arrhythmia in her brother and mother; Atrial fibrillation in her son; Celiac disease in her son; Heart failure in her mother; Hypertension in her mother; Liver cancer in her maternal grandmother; Prostate cancer in her brother; Stroke in her father; Stroke (age of onset: 59) in her brother; Stroke (age of onset: 19) in her maternal aunt.    Review of Systems: Review of Systems   Constitutional: Negative.   HENT: Negative.    Respiratory: Negative.    Cardiovascular: Negative.   Gastrointestinal: Negative.   Musculoskeletal: Negative.   Neurological: Negative.   Psychiatric/Behavioral: Negative.    All other systems reviewed and are negative.  PHYSICAL EXAM: VS:  BP 104/60 (BP Location: Left Arm, Patient Position: Sitting, Cuff Size: Normal)   Pulse 79   Ht 5\' 5"  (1.651 m)   Wt 149 lb (67.6 kg)   SpO2 97%   BMI 24.79 kg/m  , BMI Body mass index is 24.79 kg/m. Constitutional:  oriented to person, place, and time. No distress.  HENT:  Head: Grossly normal Eyes:  no discharge. No scleral icterus.  Neck: No JVD, no carotid bruits  Cardiovascular: Regular rate and rhythm, no murmurs appreciated Pulmonary/Chest: Clear to auscultation bilaterally, no wheezes or rails Abdominal: Soft.  no distension.  no tenderness.  Musculoskeletal: Normal range of motion Neurological:  normal muscle tone. Coordination normal. No atrophy Skin: Skin warm and dry Psychiatric: normal affect, pleasant  Recent Labs: 06/14/2020: ALT 13 03/26/2021: BNP 267.8 04/10/2021: BUN 19; Creatinine, Ser 0.99; Potassium 3.9; Sodium 136 04/12/2021: Hemoglobin 11.2; Platelets 105.0    Lipid Panel Lab Results  Component Value Date   CHOL 143 05/21/2019   HDL 59.50 05/21/2019   LDLCALC 71 05/21/2019   TRIG 60.0 05/21/2019      Wt Readings from Last 3 Encounters:  06/05/21 149 lb (67.6 kg)  04/24/21 150 lb 3.2 oz (68.1 kg)  04/20/21 147 lb (66.7 kg)     ASSESSMENT AND PLAN:  Problem List Items Addressed This Visit       Cardiology Problems   Hyperlipidemia (Chronic)   Thoracic aorta atherosclerosis (HCC) (Chronic)   Atrial fibrillation, persistent (HCC)   Pulmonary hypertension ([place])     Other   H/O mitral valve replacement   Other Visit Diagnoses     Chronic systolic heart failure (HCC)    -  Primary   Cardiac pacemaker       AV block, 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 Lovena Le  SVT Prior ablation, Continue metoprolol  Seizures Followed by neurology, On 2 drug regiment which has been working well for her  Cardiomyopathy Did not tolerate Entresto Tolerating low-dose losartan spironolactone metoprolol Discussed adding Jardiance or [place] She will research this class of medication and call us if she would like to start  Hyperlipidemia Cholesterol is at goal on the current lipid regimen. No changes to the medications were made.   MVR, prior history of prolapse Stable on echo    Total encounter time more than 25 minutes  Greater than 50% was spent in  counseling and coordination of care with the patient    Signed, Esmond Plants, M.D., Ph.D. [place], [place]

## 2021-06-15 ENCOUNTER — Telehealth: Payer: Self-pay | Admitting: Internal Medicine

## 2021-06-15 NOTE — Telephone Encounter (Signed)
Patient is requesting a refill on her butalbital-acetaminophen-caffeine (FIORICET) 50-325-40 MG tablet. Please only send this prescription  to [facility] must say blue pills.

## 2021-06-15 NOTE — Telephone Encounter (Signed)
Pt last seen 04/24/21 fiorcet last refilled on 04/18/21 for 90 days with 3 refills.  Called to speak with [name] [name] and discuss medication. Need to speak and see if she has any more refills if taking 3 pills a day.

## 2021-06-15 NOTE — Telephone Encounter (Signed)
[facility] pharmacy to check on the [place] prescription and spoke with [place]. [name] [name] states that Clint has 3 refills remaining and is on day 53 of her prescription.

## 2021-06-17 ENCOUNTER — Encounter (HOSPITAL_COMMUNITY): Payer: Self-pay | Admitting: Emergency Medicine

## 2021-06-17 ENCOUNTER — Emergency Department (HOSPITAL_COMMUNITY): Payer: Medicare Other

## 2021-06-17 ENCOUNTER — Inpatient Hospital Stay (HOSPITAL_COMMUNITY)
Admission: EM | Admit: 2021-06-17 | Discharge: 2021-06-23 | DRG: 481 | Disposition: A | Discharge status: Skilled Nursing Facility | Payer: Medicare Other | Source: Skilled Nursing Facility | Attending: Internal Medicine | Admitting: Internal Medicine

## 2021-06-17 ENCOUNTER — Other Ambulatory Visit: Payer: Self-pay

## 2021-06-17 DIAGNOSIS — I480 Paroxysmal atrial fibrillation: Secondary | ICD-10-CM

## 2021-06-17 DIAGNOSIS — E785 Hyperlipidemia, unspecified: Secondary | ICD-10-CM

## 2021-06-17 DIAGNOSIS — S72322A Displaced transverse fracture of shaft of left femur, initial encounter for closed fracture: Secondary | ICD-10-CM

## 2021-06-17 DIAGNOSIS — S72352A Displaced comminuted fracture of shaft of left femur, initial encounter for closed fracture: Secondary | ICD-10-CM | POA: Diagnosis present

## 2021-06-17 DIAGNOSIS — Z419 Encounter for procedure for purposes other than remedying health state, unspecified: Secondary | ICD-10-CM

## 2021-06-17 DIAGNOSIS — S63259A Unspecified dislocation of unspecified finger, initial encounter: Secondary | ICD-10-CM

## 2021-06-17 DIAGNOSIS — D62 Acute posthemorrhagic anemia: Secondary | ICD-10-CM

## 2021-06-17 DIAGNOSIS — S72392A Other fracture of shaft of left femur, initial encounter for closed fracture: Principal | ICD-10-CM

## 2021-06-17 DIAGNOSIS — M810 Age-related osteoporosis without current pathological fracture: Secondary | ICD-10-CM

## 2021-06-17 DIAGNOSIS — G40909 Epilepsy, unspecified, not intractable, without status epilepticus: Secondary | ICD-10-CM

## 2021-06-17 DIAGNOSIS — I959 Hypotension, unspecified: Secondary | ICD-10-CM

## 2021-06-17 DIAGNOSIS — W19XXXA Unspecified fall, initial encounter: Secondary | ICD-10-CM

## 2021-06-17 DIAGNOSIS — Z20822 Contact with and (suspected) exposure to covid-19: Secondary | ICD-10-CM | POA: Diagnosis not present

## 2021-06-17 DIAGNOSIS — Z950 Presence of cardiac pacemaker: Secondary | ICD-10-CM | POA: Diagnosis not present

## 2021-06-17 DIAGNOSIS — S63286A Dislocation of proximal interphalangeal joint of right little finger, initial encounter: Secondary | ICD-10-CM | POA: Diagnosis not present

## 2021-06-17 DIAGNOSIS — Z885 Allergy status to narcotic agent status: Secondary | ICD-10-CM

## 2021-06-17 DIAGNOSIS — I495 Sick sinus syndrome: Secondary | ICD-10-CM | POA: Diagnosis not present

## 2021-06-17 DIAGNOSIS — M84750A Atypical femoral fracture, unspecified, initial encounter for fracture: Secondary | ICD-10-CM | POA: Insufficient documentation

## 2021-06-17 DIAGNOSIS — S7290XA Unspecified fracture of unspecified femur, initial encounter for closed fracture: Secondary | ICD-10-CM | POA: Diagnosis present

## 2021-06-17 DIAGNOSIS — S72332A Displaced oblique fracture of shaft of left femur, initial encounter for closed fracture: Secondary | ICD-10-CM | POA: Diagnosis not present

## 2021-06-17 DIAGNOSIS — Z952 Presence of prosthetic heart valve: Secondary | ICD-10-CM | POA: Diagnosis not present

## 2021-06-17 DIAGNOSIS — Z886 Allergy status to analgesic agent status: Secondary | ICD-10-CM

## 2021-06-17 DIAGNOSIS — Z7901 Long term (current) use of anticoagulants: Secondary | ICD-10-CM

## 2021-06-17 DIAGNOSIS — I110 Hypertensive heart disease with heart failure: Secondary | ICD-10-CM | POA: Diagnosis not present

## 2021-06-17 DIAGNOSIS — Z881 Allergy status to other antibiotic agents status: Secondary | ICD-10-CM | POA: Diagnosis not present

## 2021-06-17 DIAGNOSIS — Y92000 Kitchen of unspecified non-institutional (private) residence as  the place of occurrence of the external cause: Secondary | ICD-10-CM | POA: Diagnosis not present

## 2021-06-17 DIAGNOSIS — Z888 Allergy status to other drugs, medicaments and biological substances status: Secondary | ICD-10-CM

## 2021-06-17 DIAGNOSIS — T458X5A Adverse effect of other primarily systemic and hematological agents, initial encounter: Secondary | ICD-10-CM | POA: Diagnosis present

## 2021-06-17 DIAGNOSIS — I5022 Chronic systolic (congestive) heart failure: Secondary | ICD-10-CM | POA: Diagnosis present

## 2021-06-17 DIAGNOSIS — M80851A Other osteoporosis with current pathological fracture, right femur, initial encounter for fracture: Principal | ICD-10-CM | POA: Diagnosis present

## 2021-06-17 DIAGNOSIS — Z79899 Other long term (current) drug therapy: Secondary | ICD-10-CM | POA: Diagnosis not present

## 2021-06-17 DIAGNOSIS — G9340 Encephalopathy, unspecified: Secondary | ICD-10-CM

## 2021-06-17 LAB — CBC
HCT: 33.3 % — ABNORMAL LOW (ref 36.0–46.0)
Hemoglobin: 11 g/dL — ABNORMAL LOW (ref 12.0–15.0)
MCH: 31.4 pg (ref 26.0–34.0)
MCHC: 33 g/dL (ref 30.0–36.0)
MCV: 95.1 fL (ref 80.0–100.0)
Platelets: 102 10*3/uL — ABNORMAL LOW (ref 150–400)
RBC: 3.5 MIL/uL — ABNORMAL LOW (ref 3.87–5.11)
RDW: 12.9 % (ref 11.5–15.5)
WBC: 5.8 10*3/uL (ref 4.0–10.5)
nRBC: 0 % (ref 0.0–0.2)

## 2021-06-17 LAB — COMPREHENSIVE METABOLIC PANEL
ALT: 14 U/L (ref 0–44)
AST: 24 U/L (ref 15–41)
Albumin: 3.9 g/dL (ref 3.5–5.0)
Alkaline Phosphatase: 68 U/L (ref 38–126)
Anion gap: 6 (ref 5–15)
BUN: 29 mg/dL — ABNORMAL HIGH (ref 8–23)
CO2: 27 mmol/L (ref 22–32)
Calcium: 9.1 mg/dL (ref 8.9–10.3)
Chloride: 102 mmol/L (ref 98–111)
Creatinine, Ser: 1.02 mg/dL — ABNORMAL HIGH (ref 0.44–1.00)
GFR, Estimated: 56 mL/min — ABNORMAL LOW (ref >60)
Glucose, Bld: 117 mg/dL — ABNORMAL HIGH (ref 70–99)
Potassium: 4.2 mmol/L (ref 3.5–5.1)
Sodium: 135 mmol/L (ref 135–145)
Total Bilirubin: 0.4 mg/dL (ref 0.3–1.2)
Total Protein: 6.2 g/dL — ABNORMAL LOW (ref 6.5–8.1)

## 2021-06-17 LAB — ETHANOL: Alcohol, Ethyl (B): <10 mg/dL (ref <10)

## 2021-06-17 LAB — PROTIME-INR
INR: 1 (ref 0.8–1.2)
Prothrombin Time: 13.4 seconds (ref 11.4–15.2)

## 2021-06-17 LAB — I-STAT CHEM 8, ED
BUN: 29 mg/dL — ABNORMAL HIGH (ref 8–23)
Calcium, Ion: 1.17 mmol/L (ref 1.15–1.40)
Chloride: 101 mmol/L (ref 98–111)
Creatinine, Ser: 1 mg/dL (ref 0.44–1.00)
Glucose, Bld: 112 mg/dL — ABNORMAL HIGH (ref 70–99)
HCT: 32 % — ABNORMAL LOW (ref 36.0–46.0)
Hemoglobin: 10.9 g/dL — ABNORMAL LOW (ref 12.0–15.0)
Potassium: 4.2 mmol/L (ref 3.5–5.1)
Sodium: 137 mmol/L (ref 135–145)
TCO2: 26 mmol/L (ref 22–32)

## 2021-06-17 LAB — SAMPLE TO BLOOD BANK

## 2021-06-17 LAB — RESP PANEL BY RT-PCR (FLU A&B, COVID) ARPGX2
Influenza A by PCR: NEGATIVE
Influenza B by PCR: NEGATIVE
SARS Coronavirus 2 by RT PCR: NEGATIVE

## 2021-06-17 LAB — LACTIC ACID, PLASMA: Lactic Acid, Venous: 1.3 mmol/L (ref 0.5–1.9)

## 2021-06-17 MED ORDER — LACTATED RINGERS IV SOLN: INTRAVENOUS | Status: AC

## 2021-06-17 MED ORDER — FENTANYL CITRATE (PF) 100 MCG/2ML IJ SOLN
50.0000 ug | Freq: Once | INTRAMUSCULAR | Status: AC
  Administered 2021-06-18: 50 ug via INTRAVENOUS
  Filled 2021-06-17: qty 2

## 2021-06-17 NOTE — ED Provider Notes (Signed)
[facility] EMERGENCY DEPARTMENT Provider Note   CSN: [id number] Arrival date & time: 06/17/21  2158     History Chief Complaint  Patient presents with   [name] [name] is a 80 y.o. female presenting from home with left hip pain. Patient tripped walking into living room, felt a crack in her hip.  She was not able ot bear weight.  Denies striking head on ground.  She is on eliquis for A fib and has a pacemaker.  No numbness in her left foot.    Ems gave her 100 mcg fentanyl en route to the hospital.  Started seeing [facility] for left hip pain 2 months ago.  HPI     History reviewed. No pertinent past medical history.  Patient Active Problem List   Diagnosis Date Noted   Femoral fracture ([place]) 06/17/2021     OB History   No obstetric history on file.     No family history on file.     Home Medications Prior to Admission medications   Not on File    Allergies    Patient has no known allergies.  Review of Systems   Review of Systems  Constitutional:  Negative for chills and fever.  Eyes:  Negative for pain and visual disturbance.  Respiratory:  Negative for cough and shortness of breath.   Cardiovascular:  Negative for chest pain and palpitations.  Gastrointestinal:  Negative for abdominal pain and vomiting.  Musculoskeletal:  Positive for arthralgias and myalgias.  Skin:  Negative for color change and rash.  Neurological:  Negative for syncope and light-headedness.  All other systems reviewed and are negative.  Physical Exam Updated Vital Signs BP 131/79   Pulse 70   Temp 98.1 F (36.7 C) (Oral)   Resp (!) 22   Ht 5' 5.5" (1.664 m)   Wt 64.9 kg   SpO2 99%   BMI 23.43 kg/m   Physical Exam Constitutional:      General: She is not in acute distress. HENT:     Head: Normocephalic and atraumatic.  Eyes:     Conjunctiva/sclera: Conjunctivae normal.     Pupils: Pupils are equal, round, and reactive to light.   Cardiovascular:     Rate and Rhythm: Normal rate and regular rhythm.     Pulses: Normal pulses.  Pulmonary:     Effort: Pulmonary effort is normal. No respiratory distress.  Abdominal:     General: There is no distension.     Tenderness: There is no abdominal tenderness.  Musculoskeletal:     Comments: Dislocation of right 5th finger at PIP, no open fracture Tenderness of left extremity, lenghtening of left lower extremity  Skin:    General: Skin is warm and dry.  Neurological:     General: No focal deficit present.     Mental Status: She is alert. Mental status is at baseline.  Psychiatric:        Mood and Affect: Mood normal.        Behavior: Behavior normal.    ED Results / Procedures / Treatments   Labs (all labs ordered are listed, but only abnormal results are displayed) Labs Reviewed  COMPREHENSIVE METABOLIC PANEL - Abnormal; Notable for the following components:      Result Value   Glucose, Bld 117 (*)    BUN 29 (*)    Creatinine, Ser 1.02 (*)    Total Protein 6.2 (*)    GFR, Estimated 56 (*)  All other components within normal limits  CBC - Abnormal; Notable for the following components:   RBC 3.50 (*)    Hemoglobin 11.0 (*)    HCT 33.3 (*)    Platelets 102 (*)    All other components within normal limits  I-STAT CHEM 8, ED - Abnormal; Notable for the following components:   BUN 29 (*)    Glucose, Bld 112 (*)    Hemoglobin 10.9 (*)    HCT 32.0 (*)    All other components within normal limits  RESP PANEL BY RT-PCR (FLU A&B, COVID) ARPGX2  ETHANOL  LACTIC ACID, PLASMA  PROTIME-INR  URINALYSIS, ROUTINE W REFLEX MICROSCOPIC  LACTIC ACID, PLASMA  PROTIME-INR  CBC  BASIC METABOLIC PANEL  MAGNESIUM  PHOSPHORUS  I-STAT CHEM 8, ED  SAMPLE TO BLOOD BANK  TYPE AND SCREEN    EKG None  Radiology CT Head Wo Contrast  Result Date: 06/17/2021 CLINICAL DATA:  Fall, on Eliquis EXAM: CT HEAD WITHOUT CONTRAST TECHNIQUE: Contiguous axial images were obtained  from the base of the skull through the vertex without intravenous contrast. COMPARISON:  CTA head dated 09/28/2018 FINDINGS: Brain: No evidence of acute infarction, hemorrhage, hydrocephalus, extra-axial collection or mass lesion/mass effect. Global cortical atrophy. Vascular: Intracranial atherosclerosis. Skull: Normal. Negative for fracture or focal lesion. Sinuses/Orbits: The visualized paranasal sinuses are essentially clear. The mastoid air cells are unopacified. Other: None. IMPRESSION: No evidence of acute intracranial abnormality. Mild cortical atrophy. Electronically Signed   By: Julian Hy M.D.   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 Atrial appendage clip. Probable scarring in the right mid lung. Stable enlarged cardiomediastinal silhouette. No acute consolidation, pleural effusion, or pneumothorax. IMPRESSION: Cardiomegaly with slight central congestion. Scarring in the right mid lung. Electronically Signed   By: Donavan Foil M.D.   On: 06/17/2021 22:44   DG Hand Complete Right  Result Date: 06/17/2021 CLINICAL DATA:  Trauma fall EXAM: RIGHT HAND - COMPLETE 3+ VIEW COMPARISON:  None. FINDINGS: Dorsal and ulnar dislocation base of fifth middle phalanx with respect to head of proximal phalanx. No definitive fracture seen. Advanced arthritis at the first [facility] joint and first MCP joint. Triangular fibrocartilage calcification. IMPRESSION: 1. Dislocation at the fifth PIP joint without definitive fracture Electronically Signed   By: Donavan Foil M.D.   On: 06/17/2021 22:40   DG HIP UNILAT WITH PELVIS 2-3 VIEWS LEFT  Result Date: 06/17/2021 CLINICAL DATA:  Fall EXAM: DG HIP (WITH OR WITHOUT PELVIS) 2-3V LEFT COMPARISON:  04/12/2021 FINDINGS: Pubic symphysis and rami are intact. Mild degenerative changes of  both hips. Acute displaced and overriding fracture involving proximal shaft of left femur IMPRESSION: Acute displaced and overriding proximal femoral fracture Electronically Signed   By: Donavan Foil M.D.   On: 06/17/2021 23:06   DG Femur 1 View Left  Result Date: 06/17/2021 CLINICAL DATA:  Fall assess for fracture EXAM: LEFT FEMUR 1 VIEW COMPARISON:  None. FINDINGS: Acute mildly comminuted fracture involving the proximal shaft of the femur with greater than 1 shaft diameter posterior and 1/2 shaft diameter central displacement of distal fracture fragment. About 5 cm of overriding. Femoral head appears normally position. Vascular calcification IMPRESSION: Acute mildly comminuted and displaced fracture involving proximal shaft of left femur Electronically Signed   By: Donavan Foil M.D.   On:  06/17/2021 22:42    Procedures Procedures   Medications Ordered in ED Medications  fentaNYL (SUBLIMAZE) injection 50 mcg (has no administration in time range)  lactated ringers infusion (has no administration in time range)    ED Course  I have reviewed the triage vital signs and the nursing notes.  Pertinent labs & imaging results that were available during my care of the patient were reviewed by me and considered in my medical decision making (see chart for details).  Patient is presenting with mechanical fall onto her left hip, found to have a closed left femur fracture.  She is neurovascularly intact.  CT scan of her head does not show any acute brain bleed.  The rest of her trauma work-up is unremarkable, aside from a right fifth finger dislocation at the PIP.  No fracture evident in the right hand or finger.  She was given fentanyl for pain.  This was successful.  It appears this is the only type of narcotic or pain medication she can receive, she has quite an extensive list of allergies.  This included lidocaine.  I discussed with her digital block and relocation of her right finger, but she does  not want this done at this time.  She states she would prefer for it to be repaired during her operation for her hip.  I explained that this could take some time, does not clear she will have this operation tomorrow.  She would still prefer to wait.  Her daughter is also present at the bedside for this conversation.  I reviewed her labs are otherwise intact.  I spoke to her pacemaker company by phone reported that she had no arrhythmic events noted tonight.  Clinical Course as of 06/17/21 2352  Sun Jun 17, 2021  2309 Correction I paged wrong ortho group initially - new page and c/s placed with office for [facility] Dr Alvan Dame who is covering [facility] tonight [MT]  2338 I spoke to Dr Alvan Dame who advised holding eliquis tonight, can attempt traction bucks with 5 lb if patient can tolerate.  I noted the finger dislocation - they will attempt to address this with general operation  [MT]  2343 I signed out to the hospitalist [MT]    Clinical Course User Index [MT] Leyah Bocchino, Carola Rhine, MD   Final Clinical Impression(s) / ED Diagnoses Final diagnoses:  Fall  Other closed fracture of shaft of left femur, initial encounter [facility])    Rx / DC Orders ED Discharge Orders     None        Wyvonnia Dusky, MD 06/17/21 2352

## 2021-06-17 NOTE — ED Notes (Signed)
Hospital bed ordered for pt.

## 2021-06-17 NOTE — ED Notes (Signed)
Pt to CT w/ TRN and tec.  Daughter out to update family.  She took all the pt's beloingings including purse, phone and glasses.  Left only clothing.

## 2021-06-17 NOTE — ED Triage Notes (Signed)
Per [facility], pt had a mechanical fall at home to the ground.  Pt on Elequis, felt pop to left leg which has outward rotation and is shortened.  Right pinkie also deformed.  A/O x 4.  20 G L AC 4mg  zofran  100 of fentenal

## 2021-06-17 NOTE — ED Notes (Signed)
Pacemaker successfully interrogated

## 2021-06-18 ENCOUNTER — Inpatient Hospital Stay (HOSPITAL_COMMUNITY): Payer: Medicare Other

## 2021-06-18 ENCOUNTER — Encounter (HOSPITAL_COMMUNITY)
Admission: EM | Disposition: A | Discharge status: Skilled Nursing Facility | Payer: Self-pay | Source: Skilled Nursing Facility | Attending: Internal Medicine

## 2021-06-18 ENCOUNTER — Encounter (HOSPITAL_COMMUNITY): Payer: Self-pay | Admitting: Internal Medicine

## 2021-06-18 ENCOUNTER — Inpatient Hospital Stay (HOSPITAL_COMMUNITY): Payer: Medicare Other | Admitting: Certified Registered"

## 2021-06-18 DIAGNOSIS — S72332A Displaced oblique fracture of shaft of left femur, initial encounter for closed fracture: Secondary | ICD-10-CM

## 2021-06-18 DIAGNOSIS — S72392A Other fracture of shaft of left femur, initial encounter for closed fracture: Secondary | ICD-10-CM

## 2021-06-18 HISTORY — PX: FEMUR IM NAIL: SHX1597

## 2021-06-18 LAB — CBC
HCT: 31.4 % — ABNORMAL LOW (ref 36.0–46.0)
Hemoglobin: 10.3 g/dL — ABNORMAL LOW (ref 12.0–15.0)
MCH: 30.9 pg (ref 26.0–34.0)
MCHC: 32.8 g/dL (ref 30.0–36.0)
MCV: 94.3 fL (ref 80.0–100.0)
Platelets: 100 10*3/uL — ABNORMAL LOW (ref 150–400)
RBC: 3.33 MIL/uL — ABNORMAL LOW (ref 3.87–5.11)
RDW: 12.7 % (ref 11.5–15.5)
WBC: 8.4 10*3/uL (ref 4.0–10.5)
nRBC: 0 % (ref 0.0–0.2)

## 2021-06-18 LAB — BASIC METABOLIC PANEL
Anion gap: 7 (ref 5–15)
BUN: 27 mg/dL — ABNORMAL HIGH (ref 8–23)
CO2: 27 mmol/L (ref 22–32)
Calcium: 9 mg/dL (ref 8.9–10.3)
Chloride: 103 mmol/L (ref 98–111)
Creatinine, Ser: 0.92 mg/dL (ref 0.44–1.00)
GFR, Estimated: >60 mL/min (ref >60)
Glucose, Bld: 140 mg/dL — ABNORMAL HIGH (ref 70–99)
Potassium: 4.2 mmol/L (ref 3.5–5.1)
Sodium: 137 mmol/L (ref 135–145)

## 2021-06-18 LAB — PROTIME-INR
INR: 1.2 (ref 0.8–1.2)
Prothrombin Time: 15 seconds (ref 11.4–15.2)

## 2021-06-18 LAB — LACTIC ACID, PLASMA: Lactic Acid, Venous: 1 mmol/L (ref 0.5–1.9)

## 2021-06-18 LAB — VITAMIN D 25 HYDROXY (VIT D DEFICIENCY, FRACTURES): Vit D, 25-Hydroxy: 47.91 ng/mL (ref 30–100)

## 2021-06-18 LAB — SURGICAL PCR SCREEN
MRSA, PCR: NEGATIVE
Staphylococcus aureus: NEGATIVE

## 2021-06-18 LAB — MAGNESIUM: Magnesium: 2.2 mg/dL (ref 1.7–2.4)

## 2021-06-18 LAB — PHOSPHORUS: Phosphorus: 3.1 mg/dL (ref 2.5–4.6)

## 2021-06-18 LAB — ABO/RH: ABO/RH(D): A POS

## 2021-06-18 SURGERY — INSERTION, INTRAMEDULLARY ROD, FEMUR
Anesthesia: General | Laterality: Left

## 2021-06-18 MED ORDER — PROPOFOL 10 MG/ML IV BOLUS
INTRAVENOUS | Status: DC | PRN
  Administered 2021-06-18: 100 mg via INTRAVENOUS

## 2021-06-18 MED ORDER — LORAZEPAM 2 MG/ML IJ SOLN: 2.0000 mg | INTRAMUSCULAR | Status: DC | PRN

## 2021-06-18 MED ORDER — ONDANSETRON HCL 4 MG/2ML IJ SOLN: 4.0000 mg | Freq: Four times a day (QID) | INTRAMUSCULAR | Status: DC | PRN

## 2021-06-18 MED ORDER — SUGAMMADEX SODIUM 200 MG/2ML IV SOLN
INTRAVENOUS | Status: DC | PRN
  Administered 2021-06-18: 150 mg via INTRAVENOUS

## 2021-06-18 MED ORDER — CEFAZOLIN SODIUM-DEXTROSE 2-4 GM/100ML-% IV SOLN
2.0000 g | INTRAVENOUS | Status: AC
  Administered 2021-06-18: 2 g via INTRAVENOUS
  Filled 2021-06-18: qty 100

## 2021-06-18 MED ORDER — PHENYLEPHRINE HCL (PRESSORS) 10 MG/ML IV SOLN
INTRAVENOUS | Status: DC | PRN
  Administered 2021-06-18 (×2): 80 ug via INTRAVENOUS

## 2021-06-18 MED ORDER — METHOCARBAMOL 500 MG PO TABS: 500.0000 mg | ORAL_TABLET | Freq: Four times a day (QID) | ORAL | Status: DC | PRN

## 2021-06-18 MED ORDER — FENTANYL CITRATE (PF) 250 MCG/5ML IJ SOLN
INTRAMUSCULAR | Status: AC
  Filled 2021-06-18: qty 5

## 2021-06-18 MED ORDER — BRIVARACETAM 25 MG PO TABS: 25.0000 mg | ORAL_TABLET | ORAL | Status: DC

## 2021-06-18 MED ORDER — LACTATED RINGERS IV SOLN
INTRAVENOUS | Status: DC | PRN
Start: 2021-06-18 — End: 2021-06-18

## 2021-06-18 MED ORDER — DEXAMETHASONE SODIUM PHOSPHATE 10 MG/ML IJ SOLN
INTRAMUSCULAR | Status: DC | PRN
  Administered 2021-06-18: 10 mg via INTRAVENOUS

## 2021-06-18 MED ORDER — POVIDONE-IODINE 10 % EX SWAB
2.0000 "application " | Freq: Once | CUTANEOUS | Status: AC
  Administered 2021-06-18: 2 via TOPICAL

## 2021-06-18 MED ORDER — METOCLOPRAMIDE HCL 5 MG/ML IJ SOLN: 5.0000 mg | Freq: Three times a day (TID) | INTRAMUSCULAR | Status: DC | PRN

## 2021-06-18 MED ORDER — FENTANYL CITRATE (PF) 100 MCG/2ML IJ SOLN
25.0000 ug | INTRAMUSCULAR | Status: DC | PRN
Start: 2021-06-18 — End: 2021-06-23
  Administered 2021-06-18 – 2021-06-19 (×3): 25 ug via INTRAVENOUS
  Filled 2021-06-18 (×3): qty 2

## 2021-06-18 MED ORDER — 0.9 % SODIUM CHLORIDE (POUR BTL) OPTIME
TOPICAL | Status: DC | PRN
  Administered 2021-06-18: 1000 mL

## 2021-06-18 MED ORDER — CEFAZOLIN SODIUM-DEXTROSE 2-4 GM/100ML-% IV SOLN
2.0000 g | Freq: Four times a day (QID) | INTRAVENOUS | Status: AC
Start: 2021-06-18 — End: 2021-06-19
  Administered 2021-06-18 – 2021-06-19 (×3): 2 g via INTRAVENOUS
  Filled 2021-06-18 (×3): qty 100

## 2021-06-18 MED ORDER — LIDOCAINE HCL (CARDIAC) PF 100 MG/5ML IV SOSY
PREFILLED_SYRINGE | INTRAVENOUS | Status: DC | PRN
  Administered 2021-06-18: 100 mg via INTRATRACHEAL

## 2021-06-18 MED ORDER — METOCLOPRAMIDE HCL 5 MG PO TABS: 5.0000 mg | ORAL_TABLET | Freq: Three times a day (TID) | ORAL | Status: DC | PRN

## 2021-06-18 MED ORDER — BUTALBITAL-APAP-CAFFEINE 50-325-40 MG PO TABS
1.0000 | ORAL_TABLET | Freq: Four times a day (QID) | ORAL | Status: DC | PRN
Start: 2021-06-18 — End: 2021-06-23
  Administered 2021-06-18 – 2021-06-20 (×4): 2 via ORAL
  Administered 2021-06-20 (×2): 1 via ORAL
  Administered 2021-06-22 – 2021-06-23 (×2): 2 via ORAL
  Filled 2021-06-18 (×7): qty 2
  Filled 2021-06-18: qty 1

## 2021-06-18 MED ORDER — BRIVARACETAM 25 MG PO TABS
50.0000 mg | ORAL_TABLET | Freq: Every day | ORAL | Status: DC
  Administered 2021-06-18 – 2021-06-22 (×5): 50 mg via ORAL
  Filled 2021-06-18 (×5): qty 2

## 2021-06-18 MED ORDER — ROCURONIUM 10MG/ML (10ML) SYRINGE FOR MEDFUSION PUMP - OPTIME
INTRAVENOUS | Status: DC | PRN
  Administered 2021-06-18 (×2): 10 mg via INTRAVENOUS
  Administered 2021-06-18: 20 mg via INTRAVENOUS
  Administered 2021-06-18: 50 mg via INTRAVENOUS

## 2021-06-18 MED ORDER — ONDANSETRON HCL 4 MG/2ML IJ SOLN
INTRAMUSCULAR | Status: DC | PRN
  Administered 2021-06-18: 4 mg via INTRAVENOUS

## 2021-06-18 MED ORDER — CHLORHEXIDINE GLUCONATE 4 % EX LIQD: 60.0000 mL | Freq: Once | CUTANEOUS | Status: DC

## 2021-06-18 MED ORDER — ALBUTEROL SULFATE HFA 108 (90 BASE) MCG/ACT IN AERS
2.0000 | INHALATION_SPRAY | Freq: Four times a day (QID) | RESPIRATORY_TRACT | Status: DC | PRN
  Administered 2021-06-22: 2 via RESPIRATORY_TRACT
  Filled 2021-06-18: qty 6.7

## 2021-06-18 MED ORDER — BRIVARACETAM 25 MG PO TABS
25.0000 mg | ORAL_TABLET | Freq: Every day | ORAL | Status: DC
  Administered 2021-06-18 – 2021-06-23 (×6): 25 mg via ORAL
  Filled 2021-06-18 (×5): qty 1

## 2021-06-18 MED ORDER — FLEET ENEMA 7-19 GM/118ML RE ENEM: 1.0000 | ENEMA | Freq: Once | RECTAL | Status: DC | PRN

## 2021-06-18 MED ORDER — TRAMADOL HCL 50 MG PO TABS
50.0000 mg | ORAL_TABLET | Freq: Four times a day (QID) | ORAL | Status: DC
Start: 2021-06-18 — End: 2021-06-20
  Filled 2021-06-18: qty 1

## 2021-06-18 MED ORDER — LAMOTRIGINE 100 MG PO TABS
150.0000 mg | ORAL_TABLET | Freq: Two times a day (BID) | ORAL | Status: DC
  Administered 2021-06-18 – 2021-06-23 (×11): 150 mg via ORAL
  Filled 2021-06-18 (×2): qty 2
  Filled 2021-06-18: qty 1
  Filled 2021-06-18 (×9): qty 2

## 2021-06-18 MED ORDER — ONDANSETRON HCL 4 MG PO TABS: 4.0000 mg | ORAL_TABLET | Freq: Four times a day (QID) | ORAL | Status: DC | PRN

## 2021-06-18 MED ORDER — PANTOPRAZOLE SODIUM 40 MG PO TBEC
40.0000 mg | DELAYED_RELEASE_TABLET | Freq: Every day | ORAL | Status: DC
  Administered 2021-06-19 – 2021-06-23 (×5): 40 mg via ORAL
  Filled 2021-06-18: qty 1
  Filled 2021-06-18: qty 2
  Filled 2021-06-18 (×3): qty 1

## 2021-06-18 MED ORDER — METHOCARBAMOL 1000 MG/10ML IJ SOLN
500.0000 mg | Freq: Four times a day (QID) | INTRAVENOUS | Status: DC | PRN
  Filled 2021-06-18: qty 5

## 2021-06-18 MED ORDER — BISACODYL 10 MG RE SUPP: 10.0000 mg | Freq: Every day | RECTAL | Status: DC | PRN

## 2021-06-18 MED ORDER — CHLORHEXIDINE GLUCONATE 0.12 % MT SOLN
15.0000 mL | Freq: Once | OROMUCOSAL | Status: AC
  Administered 2021-06-18: 15 mL via OROMUCOSAL
  Filled 2021-06-18: qty 15

## 2021-06-18 MED ORDER — PROPOFOL 10 MG/ML IV BOLUS
INTRAVENOUS | Status: AC
  Filled 2021-06-18: qty 20

## 2021-06-18 MED ORDER — METOPROLOL SUCCINATE ER 25 MG PO TB24
25.0000 mg | ORAL_TABLET | Freq: Every day | ORAL | Status: DC
  Administered 2021-06-18 – 2021-06-23 (×5): 25 mg via ORAL
  Filled 2021-06-18 (×6): qty 1

## 2021-06-18 MED ORDER — FLUTICASONE PROPIONATE 50 MCG/ACT NA SUSP
2.0000 | Freq: Every day | NASAL | Status: DC
  Administered 2021-06-19 – 2021-06-23 (×5): 2 via NASAL
  Filled 2021-06-18: qty 16

## 2021-06-18 MED ORDER — FENTANYL CITRATE (PF) 100 MCG/2ML IJ SOLN: 25.0000 ug | INTRAMUSCULAR | Status: DC | PRN

## 2021-06-18 MED ORDER — FENTANYL CITRATE (PF) 250 MCG/5ML IJ SOLN
INTRAMUSCULAR | Status: DC | PRN
  Administered 2021-06-18: 50 ug via INTRAVENOUS
  Administered 2021-06-18: 25 ug via INTRAVENOUS
  Administered 2021-06-18: 50 ug via INTRAVENOUS

## 2021-06-18 MED ORDER — POTASSIUM CHLORIDE IN NACL 20-0.9 MEQ/L-% IV SOLN
INTRAVENOUS | Status: DC
  Filled 2021-06-18 (×2): qty 1000

## 2021-06-18 MED ORDER — ORAL CARE MOUTH RINSE: 15.0000 mL | Freq: Once | OROMUCOSAL | Status: AC

## 2021-06-18 MED ORDER — ROSUVASTATIN CALCIUM 20 MG PO TABS
40.0000 mg | ORAL_TABLET | Freq: Every day | ORAL | Status: DC
  Administered 2021-06-19 – 2021-06-23 (×5): 40 mg via ORAL
  Filled 2021-06-18 (×5): qty 2

## 2021-06-18 MED ORDER — LACTATED RINGERS IV SOLN: INTRAVENOUS | Status: DC

## 2021-06-18 MED ORDER — CALCIUM CARBONATE-VITAMIN D 500-200 MG-UNIT PO TABS
1.0000 | ORAL_TABLET | Freq: Every day | ORAL | Status: DC
  Administered 2021-06-20 – 2021-06-23 (×4): 1 via ORAL
  Filled 2021-06-18 (×5): qty 1

## 2021-06-18 MED ORDER — MONTELUKAST SODIUM 10 MG PO TABS
10.0000 mg | ORAL_TABLET | Freq: Every day | ORAL | Status: DC
  Administered 2021-06-18 – 2021-06-22 (×4): 10 mg via ORAL
  Filled 2021-06-18 (×5): qty 1

## 2021-06-18 MED ORDER — PHENYLEPHRINE HCL-NACL 10-0.9 MG/250ML-% IV SOLN
INTRAVENOUS | Status: DC | PRN
  Administered 2021-06-18: 50 ug/min via INTRAVENOUS

## 2021-06-18 MED ORDER — DOCUSATE SODIUM 100 MG PO CAPS
100.0000 mg | ORAL_CAPSULE | Freq: Two times a day (BID) | ORAL | Status: DC
  Administered 2021-06-18 – 2021-06-22 (×8): 100 mg via ORAL
  Filled 2021-06-18 (×9): qty 1

## 2021-06-18 MED ORDER — SUFENTANIL CITRATE 50 MCG/ML IV SOLN
INTRAVENOUS | Status: AC
  Filled 2021-06-18: qty 1

## 2021-06-18 SURGICAL SUPPLY — 57 items
BAG COUNTER SPONGE SURGICOUNT (BAG) ×2 IMPLANT
BIT DRILL 2.5X110 QC LCP DISP (BIT) ×2 IMPLANT
BIT DRILL SHORT 4.2 (BIT) ×1 IMPLANT
BNDG COHESIVE 6X5 TAN STRL LF (GAUZE/BANDAGES/DRESSINGS) IMPLANT
BNDG CONFORM 2 STRL LF (GAUZE/BANDAGES/DRESSINGS) ×2 IMPLANT
BRUSH SCRUB EZ PLAIN DRY (MISCELLANEOUS) IMPLANT
COVER PERINEAL POST (MISCELLANEOUS) IMPLANT
COVER SURGICAL LIGHT HANDLE (MISCELLANEOUS) ×4 IMPLANT
DRAPE C-ARMOR (DRAPES) ×2 IMPLANT
DRAPE HALF SHEET 40X57 (DRAPES) ×4 IMPLANT
DRAPE ORTHO SPLIT 77X108 STRL (DRAPES) ×2
DRAPE SURG ORHT 6 SPLT 77X108 (DRAPES) ×2 IMPLANT
DRAPE U-SHAPE 47X51 STRL (DRAPES) ×2 IMPLANT
DRESSING MEPILEX FLEX 4X4 (GAUZE/BANDAGES/DRESSINGS) ×2 IMPLANT
DRILL BIT SHORT 4.2 (BIT) ×1
DRSG MEPILEX BORDER 4X4 (GAUZE/BANDAGES/DRESSINGS) IMPLANT
DRSG MEPILEX BORDER 4X8 (GAUZE/BANDAGES/DRESSINGS) ×2 IMPLANT
DRSG MEPILEX FLEX 4X4 (GAUZE/BANDAGES/DRESSINGS) ×4
ELECT REM PT RETURN 9FT ADLT (ELECTROSURGICAL) ×2
ELECTRODE REM PT RTRN 9FT ADLT (ELECTROSURGICAL) ×1 IMPLANT
GLOVE SRG 8 PF TXTR STRL LF DI (GLOVE) ×1 IMPLANT
GLOVE SURG ENC MOIS LTX SZ7.5 (GLOVE) ×2 IMPLANT
GLOVE SURG ENC MOIS LTX SZ8 (GLOVE) ×2 IMPLANT
GLOVE SURG UNDER POLY LF SZ7.5 (GLOVE) ×2 IMPLANT
GLOVE SURG UNDER POLY LF SZ8 (GLOVE) ×1
GOWN STRL REUS W/ TWL LRG LVL3 (GOWN DISPOSABLE) ×2 IMPLANT
GOWN STRL REUS W/ TWL XL LVL3 (GOWN DISPOSABLE) ×1 IMPLANT
GOWN STRL REUS W/TWL LRG LVL3 (GOWN DISPOSABLE) ×2
GOWN STRL REUS W/TWL XL LVL3 (GOWN DISPOSABLE) ×1
GUIDEWIRE 3.2X400 (WIRE) ×6 IMPLANT
KIT BASIN OR (CUSTOM PROCEDURE TRAY) ×2 IMPLANT
KIT TURNOVER KIT B (KITS) ×2 IMPLANT
MANIFOLD NEPTUNE II (INSTRUMENTS) ×2 IMPLANT
NAIL CANN FRN 9X400 LT (Nail) ×2 IMPLANT
NS IRRIG 1000ML POUR BTL (IV SOLUTION) ×2 IMPLANT
PACK GENERAL/GYN (CUSTOM PROCEDURE TRAY) ×2 IMPLANT
PAD ARMBOARD 7.5X6 YLW CONV (MISCELLANEOUS) ×4 IMPLANT
PLATE W-COLLAR 10MM 441.40 ×2 IMPLANT
REAMER ROD DEEP FLUTE 2.5X950 (INSTRUMENTS) ×2 IMPLANT
SCREW 3.5MM 12MM (Screw) ×8 IMPLANT
SCREW 6.5MM W/T25 STARDRIVE (Screw) ×2 IMPLANT
SCREW LOCK IM 5X48 (Screw) ×2 IMPLANT
SCREW LOCK IM TI 5X42 STRL (Screw) ×2 IMPLANT
SCREW RECON 6.5X105MM (Screw) ×2 IMPLANT
STAPLER VISISTAT 35W (STAPLE) ×2 IMPLANT
STOCKINETTE IMPERVIOUS LG (DRAPES) IMPLANT
SUT ETHILON 2 0 FS 18 (SUTURE) ×4 IMPLANT
SUT ETHILON 3 0 PS 1 (SUTURE) IMPLANT
SUT VIC AB 0 CT1 27 (SUTURE) ×1
SUT VIC AB 0 CT1 27XBRD ANBCTR (SUTURE) ×1 IMPLANT
SUT VIC AB 1 CT1 27 (SUTURE) ×1
SUT VIC AB 1 CT1 27XBRD ANBCTR (SUTURE) ×1 IMPLANT
SUT VIC AB 2-0 CT1 27 (SUTURE) ×2
SUT VIC AB 2-0 CT1 TAPERPNT 27 (SUTURE) ×2 IMPLANT
TOWEL GREEN STERILE (TOWEL DISPOSABLE) IMPLANT
TOWEL GREEN STERILE FF (TOWEL DISPOSABLE) ×2 IMPLANT
WATER STERILE IRR 1000ML POUR (IV SOLUTION) IMPLANT

## 2021-06-18 NOTE — Progress Notes (Signed)
Called and spoke to Gae Dry with [employer] since patient has a pacemaker and is scheduled for surgery today. Per Ronalee Belts, ok to use magnet if needed. No further instructions received.

## 2021-06-18 NOTE — Anesthesia Preprocedure Evaluation (Addendum)
Anesthesia Evaluation  Patient identified by MRN, date of birth, ID band Patient awake    Reviewed: Allergy & Precautions, H&P , NPO status , Patient's Chart, lab work & pertinent test results, reviewed documented beta blocker date and time   Airway Mallampati: II   Neck ROM: full    Dental no notable dental hx.    Pulmonary neg pulmonary ROS,    Pulmonary exam normal breath sounds clear to auscultation       Cardiovascular hypertension, Pt. on medications and Pt. on home beta blockers negative cardio ROS  + dysrhythmias Atrial Fibrillation + pacemaker  Rhythm:regular Rate:Normal     Neuro/Psych negative neurological ROS  negative psych ROS   GI/Hepatic negative GI ROS, Neg liver ROS,   Endo/Other  negative endocrine ROS  Renal/GU negative Renal ROS  negative genitourinary   Musculoskeletal   Abdominal   Peds  Hematology negative hematology ROS (+)   Anesthesia Other Findings   Reproductive/Obstetrics negative OB ROS                            Anesthesia Physical Anesthesia Plan  ASA: 3  Anesthesia Plan: General   Post-op Pain Management:    Induction: Intravenous  PONV Risk Score and Plan: 4 or greater and Ondansetron, Dexamethasone and Treatment may vary due to age or medical condition  Airway Management Planned: Oral ETT  Additional Equipment:   Intra-op Plan:   Post-operative Plan: Extubation in OR  Informed Consent: I have reviewed the patients History and Physical, chart, labs and discussed the procedure including the risks, benefits and alternatives for the proposed anesthesia with the patient or authorized representative who has indicated his/her understanding and acceptance.     Dental advisory given  Plan Discussed with: CRNA  Anesthesia Plan Comments:         Anesthesia Quick Evaluation

## 2021-06-18 NOTE — ED Notes (Signed)
Reports given to [facility].  MOST form and medical documents sent to the floor.

## 2021-06-18 NOTE — H&P (Signed)
History and Physical  [name] [name] VFI:[id number] DOB: [birth year] DOA: 06/17/2021  Referring physician: Dr. Langston Masker, [place]  PCP: [name], MD  Outpatient Specialists: Cardiology, neurology, orthopedic surgery. Patient coming from: Independent living facility at [facility].  Chief Complaint: Fall  HPI: [name] [name] is a 80 y.o. female with medical history significant for osteoporosis on Prolia twice a year, paroxysmal A. fib on Eliquis, mitral valve prolapse status post mitral valve replacement in 2017, sick sinus syndrome status post pacemaker placement, seizure disorder, essential hypertension, hyperlipidemia, who presented to [facility] ED after a fall at home.  Patient stated that prior to the fall she heard a loud pop and felt it on her left hip.  States that since March 2022 she has had trouble with her left hip after missing a step at her home.  She was going to physical therapy for it.  Her pain was not getting better but worse with therapy, she has been using a cane to ambulate.  Endorses history of osteoporosis for which she receives Prolia twice a year.  She feels that her left hip broke prior to her fall.  She denies any prior cardiopulmonary or GI symptoms.  She did not have her cell phone on her at the time of her fall, she had to crawl on her back to get to the phone to call EMS.  She was brought into the ED for further evaluation.  Work-up in the ED revealed left displaced femoral fracture and dislocation at the fifth PIP joint without definite fracture.  EDP consulted orthopedic surgery who recommended to hold off Eliquis, and to make her n.p.o. after midnight for possible orthopedic surgical intervention on 06/18/2021.  At the time of this visit the patient is alert and oriented x4 and in no acute distress.  During the interview she had one episode of spasm in her Left lower extremity.  Last dose of Eliquis was at 9:30 PM on 06/17/2021.  ED Course:  Temperature 98.1.  BP 131/93, pulse  79, respiratory rate 11, O2 saturation 99% on room air.  Lab studies remarkable for serum sodium 135, potassium 4.2, BUN 29, creatinine 1.02, GFR 56.  Lactic acid 1.3.  WBC 5.8, hemoglobin 11.0, MCV 95, platelet count 102.  Review of Systems: Review of systems as noted in the HPI. All other systems reviewed and are negative.  Past medical history: Paroxysmal A. fib Mitral valve prolapse status post mitral valve replacement Sick sinus syndrome status post pacemaker placement Essential hypertension Hyperlipidemia  Past surgical history: Mitral valve replacement Pacemaker placement    Social History:  has no history on file for tobacco use, alcohol use, and drug use.   Allergies  Allergen Reactions   Codeine Other (See Comments)    Migraine   Diphen [Diphenhydramine Hcl] Other (See Comments)    seizure   Doxycycline Itching and Swelling   Hydrocodone Other (See Comments)    Migraine   Hydromorphone Other (See Comments)    Migraine   Mobic [Meloxicam] Other (See Comments)    reflux   Nsaids Other (See Comments)    Pt on blood thinner   Oxycodone Other (See Comments)    Migraine   Propofol Other (See Comments)    Very sensitive   Tylenol [Acetaminophen] Other (See Comments)    TYLENOL #3 - MIGRAINE    Family history: Mother with history of heart failure. Father with history of stroke.   Prior to Admission medications   Medication Sig Start Date  End Date Taking? Authorizing Provider  BRIVIACT 25 MG TABS tablet Take 25-50 mg by mouth See admin instructions. 25mg  in am , 50mg  in pm 05/30/21  Yes [provider]  ELIQUIS 5 MG TABS tablet Take 5 mg by mouth 2 (two) times daily. 05/24/21  Yes [provider]  fluticasone (FLONASE) 50 MCG/ACT nasal spray Place 2 sprays into both nostrils daily. 05/07/21  Yes [provider]  furosemide (LASIX) 40 MG tablet Take 40 mg by mouth daily. 03/26/21  Yes [provider]  lamoTRIgine (LAMICTAL) 150 MG  tablet Take 150 mg by mouth 2 (two) times daily. 03/15/21  Yes [provider]  losartan (COZAAR) 25 MG tablet Take 12.5 mg by mouth daily. 06/16/21  Yes [provider]  metoprolol succinate (TOPROL-XL) 50 MG 24 hr tablet Take 25 mg by mouth daily. 02/15/21  Yes [provider]  montelukast (SINGULAIR) 10 MG tablet Take 10 mg by mouth at bedtime. 05/24/21  Yes [provider]  rosuvastatin (CRESTOR) 40 MG tablet Take 40 mg by mouth daily. 03/15/21  Yes [provider]  spironolactone (ALDACTONE) 25 MG tablet Take 25 mg by mouth daily. 03/13/21  Yes [provider]  VENTOLIN HFA 108 (90 Base) MCG/ACT inhaler Inhale 2 puffs into the lungs every 6 (six) hours as needed for wheezing or shortness of breath. 05/07/21  Yes [provider]    Physical Exam: BP 131/79   Pulse 70   Temp 98.1 F (36.7 C) (Oral)   Resp (!) 22   Ht 5' 5.5" (1.664 m)   Wt 64.9 kg   SpO2 99%   BMI 23.43 kg/m   General: 80 y.o. year-old female well developed well nourished in no acute distress.  Alert and oriented x3. Cardiovascular: Regular rate and rhythm with no rubs or gallops.  No thyromegaly or JVD noted.  No lower extremity edema. 2/4 pulses in all 4 extremities. Respiratory: Clear to auscultation with no wheezes or rales. Good inspiratory effort. Abdomen: Soft nontender nondistended with normal bowel sounds x4 quadrants. Muskuloskeletal: No cyanosis, clubbing or edema noted bilaterally Neuro: CN II-XII intact, strength, sensation, reflexes Skin: No ulcerative lesions noted or rashes Psychiatry: Judgement and insight appear normal. Mood is appropriate for condition and setting          Labs on Admission:  Basic Metabolic Panel: Recent Labs  Lab 06/17/21 2211 06/17/21 2220  NA 135 137  K 4.2 4.2  CL 102 101  CO2 27  --   GLUCOSE 117* 112*  BUN 29* 29*  CREATININE 1.02* 1.00  CALCIUM 9.1  --    Liver Function Tests: Recent Labs  Lab  06/17/21 2211  AST 24  ALT 14  ALKPHOS 68  BILITOT 0.4  PROT 6.2*  ALBUMIN 3.9   No results for input(s): LIPASE, AMYLASE in the last 168 hours. No results for input(s): AMMONIA in the last 168 hours. CBC: Recent Labs  Lab 06/17/21 2211 06/17/21 2220  WBC 5.8  --   HGB 11.0* 10.9*  HCT 33.3* 32.0*  MCV 95.1  --   PLT 102*  --    Cardiac Enzymes: No results for input(s): CKTOTAL, CKMB, CKMBINDEX, TROPONINI in the last 168 hours.  BNP (last 3 results) No results for input(s): BNP in the last 8760 hours.  ProBNP (last 3 results) No results for input(s): PROBNP in the last 8760 hours.  CBG: No results for input(s): GLUCAP in the last 168 hours.  Radiological Exams on Admission:  CT Head Wo Contrast  Result Date: 06/17/2021 CLINICAL DATA:  Fall, on Eliquis EXAM: CT HEAD WITHOUT CONTRAST TECHNIQUE: Contiguous axial images were obtained from the base of the skull through the vertex without intravenous contrast. COMPARISON:  CTA head dated 09/28/2018 FINDINGS: Brain: No evidence of acute infarction, hemorrhage, hydrocephalus, extra-axial collection or mass lesion/mass effect. Global cortical atrophy. Vascular: Intracranial atherosclerosis. Skull: Normal. Negative for fracture or focal lesion. Sinuses/Orbits: The visualized paranasal sinuses are essentially clear. The mastoid air cells are unopacified. Other: None. IMPRESSION: No evidence of acute intracranial abnormality. Mild cortical atrophy. Electronically Signed   By: Julian Hy M.D.   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 Atrial appendage clip. Probable scarring in the right mid lung. Stable enlarged cardiomediastinal silhouette. No acute consolidation, pleural effusion, or pneumothorax. IMPRESSION: Cardiomegaly with slight central  congestion. Scarring in the right mid lung. Electronically Signed   By: Donavan Foil M.D.   On: 06/17/2021 22:44   DG Hand Complete Right  Result Date: 06/17/2021 CLINICAL DATA:  Trauma fall EXAM: RIGHT HAND - COMPLETE 3+ VIEW COMPARISON:  None. FINDINGS: Dorsal and ulnar dislocation base of fifth middle phalanx with respect to head of proximal phalanx. No definitive fracture seen. Advanced arthritis at the first [facility] joint and first MCP joint. Triangular fibrocartilage calcification. IMPRESSION: 1. Dislocation at the fifth PIP joint without definitive fracture Electronically Signed   By: Donavan Foil M.D.   On: 06/17/2021 22:40   DG HIP UNILAT WITH PELVIS 2-3 VIEWS LEFT  Result Date: 06/17/2021 CLINICAL DATA:  Fall EXAM: DG HIP (WITH OR WITHOUT PELVIS) 2-3V LEFT COMPARISON:  04/12/2021 FINDINGS: Pubic symphysis and rami are intact. Mild degenerative changes of both hips. Acute displaced and overriding fracture involving proximal shaft of left femur IMPRESSION: Acute displaced and overriding proximal femoral fracture Electronically Signed   By: Donavan Foil M.D.   On: 06/17/2021 23:06   DG Femur 1 View Left  Result Date: 06/17/2021 CLINICAL DATA:  Fall assess for fracture EXAM: LEFT FEMUR 1 VIEW COMPARISON:  None. FINDINGS: Acute mildly comminuted fracture involving the proximal shaft of the femur with greater than 1 shaft diameter posterior and 1/2 shaft diameter central displacement of distal fracture fragment. About 5 cm of overriding. Femoral head appears normally position. Vascular calcification IMPRESSION: Acute mildly comminuted and displaced fracture involving proximal shaft of left femur Electronically Signed   By: Donavan Foil M.D.   On: 06/17/2021 22:42    EKG: I independently viewed the EKG done and my findings are as followed: None available at the time of this visit.  Assessment/Plan Present on Admission:  Femoral fracture (HCC)  Active Problems:   Femoral fracture  (HCC)  Acute displaced and overriding left proximal femoral fracture, POA Endorses that she heard a loud pop and felt it on her left hip prior to the fall. History of osteoporosis and on Prolia twice a year. Obtain vitamin D3 level, start Os-Cal D Last dose of Eliquis was on 06/17/2021 at 9:30 PM. Continue to hold off Eliquis as recommended by orthopedic surgery Obtain INR, type and screen, CBC Orthopedic surgery consulted N.p.o. after midnight IV fentanyl as needed for severe pain. Gentle IV fluid hydration LR at 30 cc/h x1 day, judiciously in the setting of underlying chronic systolic congestive heart failure.  Euvolemic on exam. Patient  is from independent living facility, [facility], and has requested to return to [facility] for SNF rehab. TOC consulted to assist with DC planning.  Paroxysmal A. fib on Eliquis Eliquis on hold since 06/17/2021 evening. Resume home Toprol-XL 25 mg daily. Monitor on telemetry.  Osteoporosis, on Prolia twice a year Endorses history of osteoporosis and on Prolia twice a year Possible osteoporotic fracture prior to her fall. Check vitamin D3 level Calcium level 9.1. Fall precautions.  Seizure disorder Resume home Lamictal and Briviact Last seizure activity was in September 2021 Seizure precautions  Hyperlipidemia Resume home Crestor.   DVT prophylaxis: SCDs  Code Status: Full code  Family Communication: None at bedside.  Has a daughter and a son.  Her daughter is a Industrial/product designer.  Disposition Plan: Admit to telemetry surgical.  Consults called: Orthopedic surgery, Dr. Roxy Manns  Admission status: Inpatient status.  Patient will require at least 2 midnights for further evaluation and treatment of present condition.   Status is: Inpatient   Dispo:  Patient From: Independent living facility.  Planned Disposition: [place]  Medically stable for discharge: No         Kayleen Memos MD [facility] Pager  [phone]  If 7PM-7AM, please contact night-coverage [email or web address] Password [email or web address]  06/18/2021, 1:42 AM

## 2021-06-18 NOTE — Anesthesia Procedure Notes (Signed)
Procedure Name: Intubation Date/Time: 06/18/2021 2:42 PM Performed by: Claris Che, CRNA Pre-anesthesia Checklist: Patient identified, Emergency Drugs available, Suction available, Patient being monitored and Timeout performed Patient Re-evaluated:Patient Re-evaluated prior to induction Oxygen Delivery Method: Circle system utilized Preoxygenation: Pre-oxygenation with 100% oxygen Induction Type: IV induction and Cricoid Pressure applied Ventilation: Mask ventilation without difficulty Laryngoscope Size: Mac and 4 Grade View: Grade III Tube type: Oral Tube size: 7.5 mm Number of attempts: 1 Airway Equipment and Method: Stylet Placement Confirmation: ETT inserted through vocal cords under direct vision, positive ETCO2 and breath sounds checked- equal and bilateral Secured at: 22 cm Tube secured with: Tape Dental Injury: Teeth and Oropharynx as per pre-operative assessment

## 2021-06-18 NOTE — Progress Notes (Signed)
No charge note  Patient seen and examined this morning, admitted overnight, H&P reviewed and agree with the assessment and plan.  80 year old female with history of osteoporosis, PAF on Eliquis, MVP s/p MVR in 2017, SSS status post PPM, seizure disorder, HTN, HLD comes in after hearing a pop into her left hip followed by a fall.  She was found to have acute displaced and overriding proximal femoral fracture.  Orthopedic surgery consulted and she was admitted to the hospital.  Proximal femur fracture management per orthopedic surgery, timing to be determined by the surgical team, she took Eliquis last night  [name] [name] M. Cruzita Lederer, MD, PhD [facility]  Between 7 am - 7 pm you can contact me via Amion (for emergencies) or [place] (non urgent matters).  I am not available 7 pm - 7 am, please contact night coverage MD/APP via Amion

## 2021-06-18 NOTE — Progress Notes (Signed)
Orthopedic Tech Progress Note Patient Details:  [name] [name] [birth year] [id number]  Musculoskeletal Traction Type of Traction: [place] Skin Traction Traction Location: lle Traction Weight: 5 lbs   Post Interventions Patient Tolerated: Well Instructions Provided: Care of device, Adjustment of device  Karolee Stamps 06/18/2021, 12:56 AM

## 2021-06-18 NOTE — TOC CAGE-AID Note (Signed)
Transition of Care [facility]) - CAGE-AID Screening   Patient Details  Name: [name] [name] MRN: [id number] Date of Birth: 12-Feb-1941    Dia Crawford, RN Phone Number: 06/18/2021, 6:01 AM   Clinical Narrative: Pt denies etoh/tobacco/drug usage. No resources needed.   CAGE-AID Screening:    Have You Ever Felt You Ought to Cut Down on Your Drinking or Drug Use?: No Have People Annoyed You By Critizing Your Drinking Or Drug Use?: No Have You Felt Bad Or Guilty About Your Drinking Or Drug Use?: No Have You Ever Had a Drink or Used Drugs First Thing In The Morning to Steady Your Nerves or to Get Rid of a Hangover?: No CAGE-AID Score: 0

## 2021-06-18 NOTE — Consult Note (Signed)
Reason for Consult:Left femur fx Referring Physician: Terance Ice Time called: 5631 Time at bedside: [name] is an 80 y.o. female.  HPI: [name] [name] was in her home in the kitchen when she felt a pop in her thigh accompanied by pain and she fell. She has been having pain there ever since she stepped wrong off a step in March. She's seen [facility] and was given PT which increased her strength but didn't help the pain. She has taken to using a cane of late to ambulate. She lives in independent living in a continuing care community.  History reviewed. No pertinent past medical history.  History reviewed. No pertinent surgical history.  No family history on file.  Social History:  has no history on file for tobacco use, alcohol use, and drug use.  Allergies:  Allergies  Allergen Reactions   Codeine Other (See Comments)    Migraine   Diphen [Diphenhydramine Hcl] Other (See Comments)    seizure   Doxycycline Itching and Swelling   Hydrocodone Other (See Comments)    Migraine   Hydromorphone Other (See Comments)    Migraine   Mobic [Meloxicam] Other (See Comments)    reflux   Nsaids Other (See Comments)    Pt on blood thinner   Oxycodone Other (See Comments)    Migraine   Propofol Other (See Comments)    Very sensitive   Tylenol [Acetaminophen] Other (See Comments)    TYLENOL #3 - MIGRAINE    Medications: I have reviewed the patient's current medications.  Results for orders placed or performed during the hospital encounter of 06/17/21 (from the past 48 hour(s))  Comprehensive metabolic panel     Status: Abnormal   Collection Time: 06/17/21 10:11 PM  Result Value Ref Range   Sodium 135 135 - 145 mmol/L   Potassium 4.2 3.5 - 5.1 mmol/L   Chloride 102 98 - 111 mmol/L   CO2 27 22 - 32 mmol/L   Glucose, Bld 117 (H) 70 - 99 mg/dL    Comment: Glucose reference range applies only to samples taken after fasting for at least 8 hours.   BUN 29 (H) 8 - 23 mg/dL    Creatinine, Ser 1.02 (H) 0.44 - 1.00 mg/dL   Calcium 9.1 8.9 - 10.3 mg/dL   Total Protein 6.2 (L) 6.5 - 8.1 g/dL   Albumin 3.9 3.5 - 5.0 g/dL   AST 24 15 - 41 U/L   ALT 14 0 - 44 U/L   Alkaline Phosphatase 68 38 - 126 U/L   Total Bilirubin 0.4 0.3 - 1.2 mg/dL   GFR, Estimated 56 (L) >60 mL/min    Comment: (NOTE) Calculated using the CKD-EPI Creatinine Equation (2021)    Anion gap 6 5 - 15    Comment: Performed at [facility] [address] [place], [place] [address]  CBC     Status: Abnormal   Collection Time: 06/17/21 10:11 PM  Result Value Ref Range   WBC 5.8 4.0 - 10.5 K/uL   RBC 3.50 (L) 3.87 - 5.11 MIL/uL   Hemoglobin 11.0 (L) 12.0 - 15.0 g/dL   HCT 33.3 (L) 36.0 - 46.0 %   MCV 95.1 80.0 - 100.0 fL   MCH 31.4 26.0 - 34.0 pg   MCHC 33.0 30.0 - 36.0 g/dL   RDW 12.9 11.5 - 15.5 %   Platelets 102 (L) 150 - 400 K/uL    Comment: Immature Platelet Fraction may be clinically indicated,  consider ordering this additional test [id number] REPEATED TO VERIFY PLATELET COUNT CONFIRMED BY SMEAR    nRBC 0.0 0.0 - 0.2 %    Comment: Performed at [facility], [place] [address] [place], [place] [address]  Ethanol     Status: None   Collection Time: 06/17/21 10:11 PM  Result Value Ref Range   Alcohol, Ethyl (B) <10 <10 mg/dL    Comment: (NOTE) Lowest detectable limit for serum alcohol is 10 mg/dL.  For medical purposes only. Performed at [facility], [place] [address], [place], [place] [address]   Lactic acid, plasma     Status: None   Collection Time: 06/17/21 10:11 PM  Result Value Ref Range   Lactic Acid, Venous 1.3 0.5 - 1.9 mmol/L    Comment: Performed at [facility] [address], [place], [place] [address]  Protime-INR     Status: None   Collection Time: 06/17/21 10:11 PM  Result Value Ref Range   Prothrombin Time 13.4 11.4 - 15.2 seconds   INR 1.0 0.8 - 1.2    Comment: (NOTE) INR goal varies based on device and disease states. Performed at  [facility], [place] [address], [place], [place] [address]   Sample to Blood Bank     Status: None   Collection Time: 06/17/21 10:11 PM  Result Value Ref Range   Blood Bank Specimen SAMPLE AVAILABLE FOR TESTING    Sample Expiration      06/18/2021,2359 Performed at [facility], [place] [address], [place], [place] [address]   Type and screen [place]     Status: None   Collection Time: 06/17/21 10:11 PM  Result Value Ref Range   ABO/RH(D) A POS    Antibody Screen NEG    Sample Expiration      06/20/2021,2359 Performed at [facility], [place] [address], [place],  [address]   Resp Panel by RT-PCR (Flu A&B, Covid) Nasopharyngeal Swab     Status: None   Collection Time: 06/17/21 10:12 PM   Specimen: Nasopharyngeal Swab; Nasopharyngeal(NP) swabs in vial transport medium  Result Value Ref Range   SARS Coronavirus 2 by RT PCR NEGATIVE NEGATIVE    Comment: (NOTE) SARS-CoV-2 target nucleic acids are NOT DETECTED.  The SARS-CoV-2 RNA is generally detectable in upper respiratory specimens during the acute phase of infection. The lowest concentration of SARS-CoV-2 viral copies this assay can detect is 138 copies/mL. A negative result does not preclude SARS-Cov-2 infection and should not be used as the sole basis for treatment or other patient management decisions. A negative result may occur with  improper specimen collection/handling, submission of specimen other than nasopharyngeal swab, presence of viral mutation(s) within the areas targeted by this assay, and inadequate number of viral copies(<138 copies/mL). A negative result must be combined with clinical observations, patient history, and epidemiological information. The expected result is Negative.  Fact Sheet for Patients:  EntrepreneurPulse.com.au  Fact Sheet for Healthcare Providers:  IncredibleEmployment.be  This test is no t yet approved or  cleared by the [place] FDA and  has been authorized for detection and/or diagnosis of SARS-CoV-2 by FDA under an Emergency Use Authorization (EUA). This EUA will remain  in effect (meaning this test can be used) for the duration of the COVID-19 declaration under Section 564(b)(1) of the Act, 21 U.S.C.section 360bbb-3(b)(1), unless the authorization is terminated  or revoked sooner.       Influenza A  by PCR NEGATIVE NEGATIVE   Influenza B by PCR NEGATIVE NEGATIVE    Comment: (NOTE) The Xpert Xpress SARS-CoV-2/FLU/RSV plus assay is intended as an aid in the diagnosis of influenza from Nasopharyngeal swab specimens and should not be used as a sole basis for treatment. Nasal washings and aspirates are unacceptable for Xpert Xpress SARS-CoV-2/FLU/RSV testing.  Fact Sheet for Patients: EntrepreneurPulse.com.au  Fact Sheet for Healthcare Providers: IncredibleEmployment.be  This test is not yet approved or cleared by the [place] FDA and has been authorized for detection and/or diagnosis of SARS-CoV-2 by FDA under an Emergency Use Authorization (EUA). This EUA will remain in effect (meaning this test can be used) for the duration of the COVID-19 declaration under Section 564(b)(1) of the Act, 21 U.S.C. section 360bbb-3(b)(1), unless the authorization is terminated or revoked.  Performed at [facility], [place] [address], [place], [place] [address]   I-Stat Chem 8, ED     Status: Abnormal   Collection Time: 06/17/21 10:20 PM  Result Value Ref Range   Sodium 137 135 - 145 mmol/L   Potassium 4.2 3.5 - 5.1 mmol/L   Chloride 101 98 - 111 mmol/L   BUN 29 (H) 8 - 23 mg/dL   Creatinine, Ser 1.00 0.44 - 1.00 mg/dL   Glucose, Bld 112 (H) 70 - 99 mg/dL    Comment: Glucose reference range applies only to samples taken after fasting for at least 8 hours.   Calcium, Ion 1.17 1.15 - 1.40 mmol/L   TCO2 26 22 - 32 mmol/L   Hemoglobin 10.9 (L)  12.0 - 15.0 g/dL   HCT 32.0 (L) 36.0 - 46.0 %  Lactic acid, plasma     Status: None   Collection Time: 06/18/21  3:18 AM  Result Value Ref Range   Lactic Acid, Venous 1.0 0.5 - 1.9 mmol/L    Comment: Performed at [facility] [address] [place], [place] [address]  Protime-INR     Status: None   Collection Time: 06/18/21  3:18 AM  Result Value Ref Range   Prothrombin Time 15.0 11.4 - 15.2 seconds   INR 1.2 0.8 - 1.2    Comment: (NOTE) INR goal varies based on device and disease states. Performed at [facility], [place] [address], [place], [place] [address]   CBC     Status: Abnormal   Collection Time: 06/18/21  3:18 AM  Result Value Ref Range   WBC 8.4 4.0 - 10.5 K/uL   RBC 3.33 (L) 3.87 - 5.11 MIL/uL   Hemoglobin 10.3 (L) 12.0 - 15.0 g/dL   HCT 31.4 (L) 36.0 - 46.0 %   MCV 94.3 80.0 - 100.0 fL   MCH 30.9 26.0 - 34.0 pg   MCHC 32.8 30.0 - 36.0 g/dL   RDW 12.7 11.5 - 15.5 %   Platelets 100 (L) 150 - 400 K/uL    Comment: Immature Platelet Fraction may be clinically indicated, consider ordering this additional test [id number] REPEATED TO VERIFY PLATELET COUNT CONFIRMED BY SMEAR    nRBC 0.0 0.0 - 0.2 %    Comment: Performed at [facility], [place] [address], [place], [place] [address]  Basic metabolic panel     Status: Abnormal   Collection Time: 06/18/21  3:18 AM  Result Value Ref Range   Sodium 137 135 - 145 mmol/L   Potassium 4.2 3.5 - 5.1 mmol/L   Chloride 103 98 - 111 mmol/L   CO2 27 22 -  32 mmol/L   Glucose, Bld 140 (H) 70 - 99 mg/dL    Comment: Glucose reference range applies only to samples taken after fasting for at least 8 hours.   BUN 27 (H) 8 - 23 mg/dL   Creatinine, Ser 0.92 0.44 - 1.00 mg/dL   Calcium 9.0 8.9 - 10.3 mg/dL   GFR, Estimated >60 >60 mL/min    Comment: (NOTE) Calculated using the CKD-EPI Creatinine Equation (2021)    Anion gap 7 5 - 15    Comment: Performed at [facility] [address] [place], [place] [address]   Magnesium     Status: None   Collection Time: 06/18/21  3:18 AM  Result Value Ref Range   Magnesium 2.2 1.7 - 2.4 mg/dL    Comment: Performed at [facility] [address], [place], [place] [address]  Phosphorus     Status: None   Collection Time: 06/18/21  3:18 AM  Result Value Ref Range   Phosphorus 3.1 2.5 - 4.6 mg/dL    Comment: Performed at [facility] [address] [place] [address] [place], [place] [address]  ABO/Rh     Status: None   Collection Time: 06/18/21  3:18 AM  Result Value Ref Range   ABO/RH(D)      A POS Performed at [facility] [address], [place], [place] [address]   VITAMIN D 25 Hydroxy (Vit-D Deficiency, Fractures)     Status: None   Collection Time: 06/18/21  3:18 AM  Result Value Ref Range   Vit D, 25-Hydroxy 47.91 30 - 100 ng/mL    Comment: (NOTE) Vitamin D deficiency has been defined by the [place] practice guideline as a level of serum 25-OH  vitamin D less than 20 ng/mL (1,2). The Endocrine Society went on to  further define vitamin D insufficiency as a level between 21 and 29  ng/mL (2).  1. IOM (Institute of Medicine). 2010. Dietary reference intakes for  calcium and D. [place]: The [employer]. 2. Holick MF, Binkley [place], Bischoff-Ferrari HA, et al. Evaluation,  treatment, and prevention of vitamin D deficiency: an Endocrine  Society clinical practice guideline, JCEM. 2011 Jul; 96(7): 1911-30.  Performed at [facility], [place] [address], [place],  [address]     CT Head Wo Contrast  Result Date: 06/17/2021 CLINICAL DATA:  Fall, on Eliquis EXAM: CT HEAD WITHOUT CONTRAST TECHNIQUE: Contiguous axial images were obtained from the base of the skull through the vertex without intravenous contrast. COMPARISON:  CTA head dated 09/28/2018 FINDINGS: Brain: No evidence of acute infarction, hemorrhage, hydrocephalus, extra-axial collection or mass lesion/mass effect. Global  cortical atrophy. Vascular: Intracranial atherosclerosis. Skull: Normal. Negative for fracture or focal lesion. Sinuses/Orbits: The visualized paranasal sinuses are essentially clear. The mastoid air cells are unopacified. Other: None. IMPRESSION: No evidence of acute intracranial abnormality. Mild cortical atrophy. Electronically Signed   By: Julian Hy M.D.   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 Atrial appendage clip. Probable scarring in the right mid lung. Stable enlarged cardiomediastinal silhouette. No acute consolidation, pleural effusion, or pneumothorax. IMPRESSION: Cardiomegaly with slight central congestion. Scarring in the right mid lung. Electronically Signed   By: Donavan Foil M.D.   On: 06/17/2021 22:44  DG Hand Complete Right  Result Date: 06/17/2021 CLINICAL DATA:  Trauma fall EXAM: RIGHT HAND - COMPLETE 3+ VIEW COMPARISON:  None. FINDINGS: Dorsal and ulnar dislocation base of fifth middle phalanx with respect to head of proximal phalanx. No definitive fracture seen. 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 Triangular fibrocartilage calcification. IMPRESSION: 1. Dislocation at the fifth PIP joint without definitive fracture Electronically Signed   By: Donavan Foil M.D.   On: 06/17/2021 22:40   DG HIP UNILAT WITH PELVIS 2-3 VIEWS LEFT  Result Date: 06/17/2021 CLINICAL DATA:  Fall EXAM: DG HIP (WITH OR WITHOUT PELVIS) 2-3V LEFT COMPARISON:  04/12/2021 FINDINGS: Pubic symphysis and rami are intact. Mild degenerative changes of both hips. Acute displaced and overriding fracture involving proximal shaft of left femur IMPRESSION: Acute displaced and overriding proximal femoral fracture Electronically Signed   By: Donavan Foil M.D.   On: 06/17/2021 23:06   DG Femur 1 View  Left  Result Date: 06/17/2021 CLINICAL DATA:  Fall assess for fracture EXAM: LEFT FEMUR 1 VIEW COMPARISON:  None. FINDINGS: Acute mildly comminuted fracture involving the proximal shaft of the femur with greater than 1 shaft diameter posterior and 1/2 shaft diameter central displacement of distal fracture fragment. About 5 cm of overriding. Femoral head appears normally position. Vascular calcification IMPRESSION: Acute mildly comminuted and displaced fracture involving proximal shaft of left femur Electronically Signed   By: Donavan Foil M.D.   On: 06/17/2021 22:42    Review of Systems  HENT:  Negative for ear discharge, ear pain, hearing loss and tinnitus.   Eyes:  Negative for photophobia and pain.  Respiratory:  Negative for cough and shortness of breath.   Cardiovascular:  Negative for chest pain.  Gastrointestinal:  Negative for abdominal pain, nausea and vomiting.  Genitourinary:  Negative for dysuria, flank pain, frequency and urgency.  Musculoskeletal:  Positive for arthralgias (Left thigh). Negative for back pain, myalgias and neck pain.  Neurological:  Negative for dizziness and headaches.  Hematological:  Does not bruise/bleed easily.  Psychiatric/Behavioral:  The patient is not nervous/anxious.   Blood pressure 119/66, pulse 70, temperature 98.1 F (36.7 C), temperature source Oral, resp. rate 18, height 5' 5.5" (1.664 m), weight 64.9 kg, SpO2 99 %. Physical Exam Constitutional:      General: She is not in acute distress.    Appearance: She is well-developed. She is not diaphoretic.  HENT:     Head: Normocephalic and atraumatic.  Eyes:     General: No scleral icterus.       Right eye: No discharge.        Left eye: No discharge.     Conjunctiva/sclera: Conjunctivae normal.  Cardiovascular:     Rate and Rhythm: Normal rate and regular rhythm.  Pulmonary:     Effort: Pulmonary effort is normal. No respiratory distress.  Musculoskeletal:     Cervical back: Normal range of  motion.     Comments: Right shoulder, elbow, wrist, digits- no skin wounds, dislocation little finger PIP joint, NT, no instability, no blocks to motion  Sens  Ax/R/M/U intact  Mot   Ax/ R/ PIN/ M/ AIN/ U intact  Rad 2+  LLE No traumatic wounds, ecchymosis, or rash  Bucks traction in place  No knee or ankle effusion  Knee stable to varus/ valgus and anterior/posterior stress  Sens DPN, SPN, TN intact  Motor EHL 5/5  DP 1+, No significant edema  Skin:    General: Skin is  warm and dry.  Neurological:     Mental Status: She is alert.  Psychiatric:        Mood and Affect: Mood normal.        Behavior: Behavior normal.    Assessment/Plan: Left femur fx -- Plan IMN +/- ORIF by Dr. Marcelino Scot later today. Please keep NPO. Right little finger PIP dislocation -- Plan CR while in OR.    Lisette Abu, PA-C Orthopedic Surgery [phone] 06/18/2021, 9:28 AM

## 2021-06-18 NOTE — Transfer of Care (Signed)
Immediate Anesthesia Transfer of Care Note  Patient: [name] [name]  Procedure(s) Performed: INTRAMEDULLARY (IM) NAIL FEMORAL, OPEN REDUCTION INTERNAL FIXATION FEMUR RIGHT LITTLE FINGER PIP CLOSED REDUCTION (Left)  Patient Location: PACU  Anesthesia Type:General  Level of Consciousness: awake, alert  and oriented  Airway & Oxygen Therapy: Patient Spontanous Breathing  Post-op Assessment: Report given to RN and Post -op Vital signs reviewed and stable  Post vital signs: Reviewed and stable  Last Vitals:  Vitals Value Taken Time  BP 110/67 06/18/21 1728  Temp    Pulse 76 06/18/21 1731  Resp 17 06/18/21 1731  SpO2 96 % 06/18/21 1731  Vitals shown include unvalidated device data.  Last Pain:  Vitals:   06/18/21 1318  TempSrc: Oral  PainSc: 8          Complications: No notable events documented.

## 2021-06-18 NOTE — TOC Progression Note (Signed)
Transition of Care [facility]) - Progression Note    Patient Details  Name: [name] [name] MRN: [id number] Date of Birth: 1941/08/30  [facility] of [facility]) CM/SW Contact  Milinda Antis, [place] Phone Number: 06/18/2021, 12:11 PM  Clinical Narrative:     TOC following patient for any d/c planning needs once medically stable.   Lind Covert, MSW, LCSWA        Expected Discharge Plan and Services                                                 Social Determinants of Health (SDOH) Interventions    Readmission Risk Interventions No flowsheet data found.

## 2021-06-19 ENCOUNTER — Encounter (HOSPITAL_COMMUNITY): Payer: Self-pay | Admitting: Orthopedic Surgery

## 2021-06-19 DIAGNOSIS — E785 Hyperlipidemia, unspecified: Secondary | ICD-10-CM

## 2021-06-19 DIAGNOSIS — I480 Paroxysmal atrial fibrillation: Secondary | ICD-10-CM

## 2021-06-19 DIAGNOSIS — M810 Age-related osteoporosis without current pathological fracture: Secondary | ICD-10-CM

## 2021-06-19 DIAGNOSIS — G40909 Epilepsy, unspecified, not intractable, without status epilepticus: Secondary | ICD-10-CM

## 2021-06-19 DIAGNOSIS — S72352A Displaced comminuted fracture of shaft of left femur, initial encounter for closed fracture: Secondary | ICD-10-CM | POA: Diagnosis not present

## 2021-06-19 DIAGNOSIS — G9340 Encephalopathy, unspecified: Secondary | ICD-10-CM

## 2021-06-19 LAB — BASIC METABOLIC PANEL
Anion gap: 8 (ref 5–15)
BUN: 20 mg/dL (ref 8–23)
CO2: 22 mmol/L (ref 22–32)
Calcium: 8.5 mg/dL — ABNORMAL LOW (ref 8.9–10.3)
Chloride: 106 mmol/L (ref 98–111)
Creatinine, Ser: 0.91 mg/dL (ref 0.44–1.00)
GFR, Estimated: >60 mL/min (ref >60)
Glucose, Bld: 149 mg/dL — ABNORMAL HIGH (ref 70–99)
Potassium: 4.5 mmol/L (ref 3.5–5.1)
Sodium: 136 mmol/L (ref 135–145)

## 2021-06-19 LAB — CBC
HCT: 27.1 % — ABNORMAL LOW (ref 36.0–46.0)
Hemoglobin: 9.1 g/dL — ABNORMAL LOW (ref 12.0–15.0)
MCH: 31.3 pg (ref 26.0–34.0)
MCHC: 33.6 g/dL (ref 30.0–36.0)
MCV: 93.1 fL (ref 80.0–100.0)
Platelets: 105 10*3/uL — ABNORMAL LOW (ref 150–400)
RBC: 2.91 MIL/uL — ABNORMAL LOW (ref 3.87–5.11)
RDW: 12.8 % (ref 11.5–15.5)
WBC: 8.7 10*3/uL (ref 4.0–10.5)
nRBC: 0 % (ref 0.0–0.2)

## 2021-06-19 MED ORDER — AQUAPHOR EX OINT
TOPICAL_OINTMENT | CUTANEOUS | Status: DC | PRN
  Filled 2021-06-19: qty 50

## 2021-06-19 NOTE — Assessment & Plan Note (Addendum)
- "  Acute mildly comminuted and displaced fracture involving proximal shaft of left femur" - s/p ORIF with ortho on 06/18/21 - follow up PT eval: SNF recommended - continue pain control; fioricet as she only tolerates this - continue Eliquis; resumed 7/20

## 2021-06-19 NOTE — TOC Progression Note (Addendum)
Transition of Care [facility]) - Progression Note    Patient Details  Name: [name] [name] MRN: [id number] Date of Birth: 1941-10-14  [facility] CM/SW Contact  Milinda Antis, LCSWA Phone Number: 06/19/2021, 9:44 AM  Clinical Narrative:     09:44-  CSW spoke with Seth Bake at [facility] and verified that the patient can return and receive rehab at the facility when the patient is medically ready.  Pending: PT/OT assessments and recommendations        Expected Discharge Plan and Services                                                 Social Determinants of Health (SDOH) Interventions    Readmission Risk Interventions No flowsheet data found.

## 2021-06-19 NOTE — Assessment & Plan Note (Addendum)
-   Eliquis resumed - Continue Toprol

## 2021-06-19 NOTE — Assessment & Plan Note (Signed)
Continue statin. 

## 2021-06-19 NOTE — Anesthesia Postprocedure Evaluation (Signed)
Anesthesia Post Note  Patient: [name] [name]  Procedure(s) Performed: INTRAMEDULLARY (IM) NAIL FEMORAL, OPEN REDUCTION INTERNAL FIXATION FEMUR RIGHT LITTLE FINGER PIP CLOSED REDUCTION (Left)     Patient location during evaluation: PACU Anesthesia Type: General Level of consciousness: awake and alert Pain management: pain level controlled Vital Signs Assessment: post-procedure vital signs reviewed and stable Respiratory status: spontaneous breathing, nonlabored ventilation, respiratory function stable and patient connected to nasal cannula oxygen Cardiovascular status: blood pressure returned to baseline and stable Postop Assessment: no apparent nausea or vomiting Anesthetic complications: no   No notable events documented.  Last Vitals:  Vitals:   06/19/21 0300 06/19/21 0700  BP: 99/62 (!) 103/58  Pulse: 72 70  Resp: 16 17  Temp: (!) 36.3 C 36.7 C  SpO2: 95% 98%    Last Pain:  Vitals:   06/19/21 0934  TempSrc:   PainSc: [place]

## 2021-06-19 NOTE — Evaluation (Signed)
Physical Therapy Evaluation Patient Details Name: [name] [name] MRN: [id number] DOB: 10-01-1941 Today's Date: 06/19/2021   History of Present Illness  80 yo female presents to [facility] on 7/17 after fall at [place]. Pt sustained L acute displaced L proximal femur fracture, s/p IMN vs ORIF (op note pending) on 7/18. Pt also with R little finger PIP dislocation, s/p closed reduction. CTH negative for acute findings. PMH includes osteoporosis, PAF, mitral valve prolapse status post mitral valve replacement in 2017, sick sinus syndrome status post pacemaker placement, seizure disorder, essential hypertension, hyperlipidemia.  Clinical Impression  Pt presents with impaired strength, LLE post-operative pain, impaired balance with history of falls, antalgic gait, and decreased activity tolerance vs baseline. Pt to benefit from acute PT to address deficits. Pt ambulated short room distance with use of RW and min steadying assist by PT. Pt hopeful to d/c to [facility], pt currently in the ILF section. PT to progress mobility as tolerated, and will continue to follow acutely.      Follow Up Recommendations SNF;Supervision for mobility/OOB    Equipment Recommendations  None recommended by PT (next venue)    Recommendations for Other Services       Precautions / Restrictions Precautions Precautions: Fall Restrictions Weight Bearing Restrictions: Yes LLE Weight Bearing: Weight bearing as tolerated      Mobility  Bed Mobility Overal bed mobility: Needs Assistance Bed Mobility: Supine to Sit     Supine to sit: Mod assist Sit to supine: Max assist   General bed mobility comments: Mod assist for LLE progression to EOB, trunk elevation off of bed, scootign to EOB.    Transfers Overall transfer level: Needs assistance Equipment used: Rolling walker (2 wheeled) Transfers: Sit to/from Stand Sit to Stand: Min assist;From elevated surface         General transfer comment: min assist for power  up, rise, and steadying. VC for hand placement when rising/sitting.  Ambulation/Gait Ambulation/Gait assistance: Min assist Gait Distance (Feet): 15 Feet Assistive device: Rolling walker (2 wheeled) Gait Pattern/deviations: Step-through pattern;Decreased stride length;Trunk flexed;Antalgic Gait velocity: decr   General Gait Details: min assist to steady, guide RW. Verbal cuing for uprght posture, sequencing steps.  Stairs            Wheelchair Mobility    Modified Rankin (Stroke Patients Only)       Balance Overall balance assessment: Needs assistance Sitting-balance support: Bilateral upper extremity supported Sitting balance-Leahy Scale: Fair Sitting balance - Comments: pt sat EOB x 17 minutes, shifting weight to R side to alleviate pain to L LE   Standing balance support: Bilateral upper extremity supported;During functional activity Standing balance-Leahy Scale: Poor Standing balance comment: reliant on external assist                             Pertinent Vitals/Pain Pain Assessment: Faces Pain Score: 6  Faces Pain Scale: Hurts little more Pain Location: LLE Pain Descriptors / Indicators: Aching;Discomfort Pain Intervention(s): Limited activity within patient's tolerance;Monitored during session;Premedicated before session;Repositioned    Home Living Family/patient expects to be discharged to:: Skilled nursing facility                      Prior Function Level of Independence: Independent with assistive device(s)   Gait / Transfers Assistance Needed: uses no AD for mobility per pt report, has cane  ADL's / Homemaking Assistance Needed: Pt reports that she was Ind with  ADLs/selfcare, IADLs, home mgt, cooking and was driving  Comments: pt reports using cane for ambulation PTA, otherwise independent     Hand Dominance   Dominant Hand: Right    Extremity/Trunk Assessment   Upper Extremity Assessment Upper Extremity Assessment:  Generalized weakness;RUE deficits/detail RUE Deficits / Details: digits 4,5 splinted RUE: Unable to fully assess due to immobilization    Lower Extremity Assessment Lower Extremity Assessment: Defer to PT evaluation       Communication   Communication: No difficulties  Cognition Arousal/Alertness: Awake/alert Behavior During Therapy: WFL for tasks assessed/performed Overall Cognitive Status: Within Functional Limits for tasks assessed                                        General Comments      Exercises     Assessment/Plan    PT Assessment Patient needs continued PT services  PT Problem List Decreased strength;Decreased mobility;Decreased activity tolerance;Decreased balance;Decreased knowledge of use of DME;Pain;Cardiopulmonary status limiting activity;Decreased safety awareness;Decreased knowledge of precautions       PT Treatment Interventions DME instruction;Therapeutic activities;Gait training;Therapeutic exercise;Patient/family education;Balance training;Functional mobility training;Neuromuscular re-education    PT Goals (Current goals can be found in the Care Plan section)  Acute Rehab PT Goals Patient Stated Goal: walk more, put my own pants on without assistance PT Goal Formulation: With patient Time For Goal Achievement: 07/03/21 Potential to Achieve Goals: Good    Frequency Min 3X/week   Barriers to discharge        Co-evaluation               AM-PAC PT "6 Clicks" Mobility  Outcome Measure Help needed turning from your back to your side while in a flat bed without using bedrails?: A Little Help needed moving from lying on your back to sitting on the side of a flat bed without using bedrails?: A Little Help needed moving to and from a bed to a chair (including a wheelchair)?: A Little Help needed standing up from a chair using your arms (e.g., wheelchair or bedside chair)?: A Little Help needed to walk in hospital room?: A  Little Help needed climbing 3-5 steps with a railing? : A Lot 6 Click Score: 17    End of Session Equipment Utilized During Treatment: Gait belt Activity Tolerance: Patient tolerated treatment well Patient left: in chair;with call bell/phone within reach;with chair alarm set Nurse Communication: Mobility status PT Visit Diagnosis: Other abnormalities of gait and mobility (R26.89);Muscle weakness (generalized) (M62.81)    Time: 6195-0932 PT Time Calculation (min) (ACUTE ONLY): 25 min   Charges:   PT Evaluation $PT Eval Low Complexity: 1 Low PT Treatments $Gait Training: 8-22 mins        Stacie Glaze, PT DPT Acute Rehabilitation Services Pager [phone]  Office [phone]   [place] 06/19/2021, 3:02 PM

## 2021-06-19 NOTE — Evaluation (Signed)
Occupational Therapy Evaluation Patient Details Name: [name] [name] MRN: [id number] DOB: [birth year] Today's Date: 06/19/2021    History of Present Illness 80 yo female presents to [facility] on 7/17 after fall at [place]. Pt sustained L acute displaced L proximal femur fracture, s/p IMN vs ORIF (op note pending) on 7/18. Pt also with R little finger PIP dislocation, s/p closed reduction. CTH negative for acute findings. PMH includes osteoporosis, PAF, mitral valve prolapse status post mitral valve replacement in 2017, sick sinus syndrome status post pacemaker placement, seizure disorder, essential hypertension, hyperlipidemia.   Clinical Impression   Pt presents with decline in function and safety with ADLs and ADL mobility with impaired strength, balance, endurance ad R hand ROM. PTA pt lived at [place] and was Ind with ADLs/selfcare, IADLs, home mgt, cooking and was driving. Pt currently requires mod A to sit EOB, Fair sitting balance, max - total A with LB ADLs, min guard A with UB ADLs and unable to attempt sit - stand due to pain in L LE and anxiety/fear of falling. Pt states that she will try to stand with PT later. Pt would benefit from acute OT services to address impairments to maximize level of function and safety    Follow Up Recommendations  SNF    Equipment Recommendations  Other (comment) (TBD at SNF)    Recommendations for Other Services       Precautions / Restrictions Precautions Precautions: Fall Restrictions Weight Bearing Restrictions: Yes LLE Weight Bearing: Weight bearing as tolerated      Mobility Bed Mobility Overal bed mobility: Needs Assistance Bed Mobility: Supine to Sit;Sit to Supine     Supine to sit: Mod assist Sit to supine: Max assist   General bed mobility comments: mod A with L LE to EOB and off EOB, no assist required with trunk, max A with LEs back onto bed    Transfers                 General transfer comment: deferred due to pain,  anxious    Balance Overall balance assessment: Needs assistance Sitting-balance support: Bilateral upper extremity supported;Feet supported Sitting balance-Leahy Scale: Fair Sitting balance - Comments: pt sat EOB x 17 minutes, shifting weight to R side to alleviate pain to L LE                                   ADL either performed or assessed with clinical judgement   ADL Overall ADL's : Needs assistance/impaired Eating/Feeding: Set up;Independent;Sitting   Grooming: Wash/dry hands;Wash/dry face;Min guard;Sitting   Upper Body Bathing: Min guard;Sitting   Lower Body Bathing: Maximal assistance   Upper Body Dressing : Min guard;Sitting   Lower Body Dressing: Total assistance     Toilet Transfer Details (indicate cue type and reason): deferred Toileting- Clothing Manipulation and Hygiene: Total assistance;Bed level         General ADL Comments: pt sat EOB x 17 minutes, shifting weight to R side to alleviate pain to L LE     Vision Baseline Vision/History: Wears glasses Patient Visual Report: No change from baseline       Perception     Praxis      Pertinent Vitals/Pain Pain Assessment: 0-10 Pain Score: 6  Faces Pain Scale: Hurts little more (LLE; sore; monitored during session) Pain Location: L LE, no pain before sitting EOB, 3 - 6 after sitting EOB Pain  Descriptors / Indicators: Aching;Discomfort Pain Intervention(s): Limited activity within patient's tolerance;Monitored during session;Premedicated before session;Repositioned     Hand Dominance Right   Extremity/Trunk Assessment Upper Extremity Assessment Upper Extremity Assessment: Generalized weakness;RUE deficits/detail RUE Deficits / Details: digits 4,5 splinted RUE: Unable to fully assess due to immobilization   Lower Extremity Assessment Lower Extremity Assessment: Defer to PT evaluation       Communication Communication Communication: No difficulties   Cognition  Arousal/Alertness: Awake/alert Behavior During Therapy: WFL for tasks assessed/performed Overall Cognitive Status: Within Functional Limits for tasks assessed                                     General Comments       Exercises     Shoulder Instructions      Home Living Family/patient expects to be discharged to:: Skilled nursing facility                                        Prior Functioning/Environment Level of Independence: Independent with assistive device(s)  Gait / Transfers Assistance Needed: uses no AD for mobility per pt report, has cane ADL's / Homemaking Assistance Needed: Pt reports that she was Ind with ADLs/selfcare, IADLs, home mgt, cooking and was driving   Comments: pt reports using cane for ambulation PTA, otherwise independent        OT Problem List: Decreased strength;Impaired balance (sitting and/or standing);Pain;Decreased activity tolerance;Decreased coordination;Decreased knowledge of use of DME or AE      OT Treatment/Interventions: Self-care/ADL training;Therapeutic exercise;Patient/family education;Balance training;Therapeutic activities;DME and/or AE instruction    OT Goals(Current goals can be found in the care plan section) Acute Rehab OT Goals Patient Stated Goal: walk again OT Goal Formulation: With patient/family Time For Goal Achievement: 07/03/21 Potential to Achieve Goals: Good ADL Goals Pt Will Perform Grooming: with supervision;with set-up;sitting Pt Will Perform Upper Body Bathing: with supervision;with set-up;sitting Pt Will Perform Lower Body Bathing: with mod assist;with min assist;sitting/lateral leans Pt Will Perform Upper Body Dressing: with supervision;with set-up;sitting Pt Will Transfer to Toilet: with max assist;stand pivot transfer;bedside commode  OT Frequency: Min 2X/week   Barriers to D/C:            Co-evaluation              AM-PAC OT "6 Clicks" Daily Activity      Outcome Measure Help from another person eating meals?: None Help from another person taking care of personal grooming?: A Little Help from another person toileting, which includes using toliet, bedpan, or urinal?: Total Help from another person bathing (including washing, rinsing, drying)?: A Lot Help from another person to put on and taking off regular upper body clothing?: A Little Help from another person to put on and taking off regular lower body clothing?: Total 6 Click Score: 14   End of Session    Activity Tolerance: Patient limited by pain Patient left: in bed;with call bell/phone within reach;with bed alarm set;with family/visitor present  OT Visit Diagnosis: Other abnormalities of gait and mobility (R26.89);History of falling (Z91.81);Muscle weakness (generalized) (M62.81);Pain Pain - Right/Left: Left Pain - part of body: Hip;Leg                Time: 1022-1050 OT Time Calculation (min): 28 min Charges:  OT General Charges $OT Visit: 1 Visit OT  Evaluation $OT Eval Moderate Complexity: 1 Mod OT Treatments $Self Care/Home Management : 8-22 mins    Emmit Alexanders [facility] 06/19/2021, 2:09 PM

## 2021-06-19 NOTE — Assessment & Plan Note (Addendum)
-   continue Lamictal and Briviact - tramadol discontinued due to seizure history

## 2021-06-19 NOTE — Hospital Course (Signed)
[name] [name] is a 80 y.o. female with medical history significant for osteoporosis on Prolia twice a year, paroxysmal A. fib on Eliquis, mitral valve prolapse status post mitral valve replacement in 2017, sick sinus syndrome status post pacemaker placement, seizure disorder, essential hypertension, hyperlipidemia, who presented to [facility] ED after a fall at home.   Patient stated that prior to the fall she heard a loud pop and felt it on her left hip.  States that since March 2022 she has had trouble with her left hip after missing a step at her home.  She was going to physical therapy for it.  Her pain was not getting better but worse with therapy, she has been using a cane to ambulate.  Endorses history of osteoporosis for which she receives Prolia twice a year.  She feels that her left hip broke prior to her fall.  She denies any prior cardiopulmonary or GI symptoms.  She did not have her cell phone on her at the time of her fall, she had to crawl on her back to get to the phone to call EMS.  She was brought into the ED for further evaluation.  Work-up in the ED revealed left displaced femoral fracture and dislocation at the fifth PIP joint without definite fracture.   She was evaluated by orthopedic surgery and underwent ORIF on 06/18/2021.  Finger dislocation was also reduced in the OR.

## 2021-06-19 NOTE — Assessment & Plan Note (Signed)
-   On Prolia at home twice yearly - Follow-up vitamin D level: 47 (normal) - Continue fall precautions

## 2021-06-19 NOTE — Telephone Encounter (Signed)
Left a message to call back.

## 2021-06-19 NOTE — Progress Notes (Signed)
Remote pacemaker transmission.   

## 2021-06-19 NOTE — Progress Notes (Signed)
Progress Note    ANTONETTE HENDRICKS   JJO:[id number]  DOB: 1941/08/01  DOA: 06/17/2021     2  PCP: [name], MD  CC: Fall at home  [facility] Course: [name] [name] is a 80 y.o. female with medical history significant for osteoporosis on Prolia twice a year, paroxysmal A. fib on Eliquis, mitral valve prolapse status post mitral valve replacement in 2017, sick sinus syndrome status post pacemaker placement, seizure disorder, essential hypertension, hyperlipidemia, who presented to [facility] ED after a fall at home.   Patient stated that prior to the fall she heard a loud pop and felt it on her left hip.  States that since March 2022 she has had trouble with her left hip after missing a step at her home.  She was going to physical therapy for it.  Her pain was not getting better but worse with therapy, she has been using a cane to ambulate.  Endorses history of osteoporosis for which she receives Prolia twice a year.  She feels that her left hip broke prior to her fall.  She denies any prior cardiopulmonary or GI symptoms.  She did not have her cell phone on her at the time of her fall, she had to crawl on her back to get to the phone to call EMS.  She was brought into the ED for further evaluation.  Work-up in the ED revealed left displaced femoral fracture and dislocation at the fifth PIP joint without definite fracture.   She was evaluated by orthopedic surgery and underwent ORIF on 06/18/2021.  Finger dislocation was also reduced in the OR.  Interval History:  No events overnight.  Son present bedside this morning.  She notes ongoing pain but mostly controlled this morning.  Nervous about working with physical therapy later today however.  ROS: Constitutional: negative for chills and fevers, Respiratory: negative for cough, Cardiovascular: negative, and Gastrointestinal: negative for abdominal pain  Assessment & Plan: * Displaced comminuted fracture of shaft of left femur ([place]) - "Acute  mildly comminuted and displaced fracture involving proximal shaft of left femur" - s/p ORIF with ortho on 06/18/21 - follow up PT eval - continue pain control  - Eliquis on hold; to resume per ortho rec's  Hyperlipidemia - Continue statin  Seizure disorder ([place]) - continue Lamictal and Briviact  Osteoporosis - On Prolia at home twice yearly - Follow-up vitamin D level: 47 (normal) - Continue fall precautions  PAF (paroxysmal atrial fibrillation) (HCC) - Eliquis on hold in setting of surgery - Continue Toprol - Continue telemetry   Old records reviewed in assessment of this patient  Antimicrobials:   DVT prophylaxis: pending Eliquis resumption; SCD for now    Code Status:   Code Status: Full Code Family Communication: son  Disposition Plan: Status is: Inpatient  Remains inpatient appropriate because:Ongoing active pain requiring inpatient pain management and Inpatient level of care appropriate due to severity of illness  Dispo:  Patient From: Home  Planned Disposition: [place]  Medically stable for discharge: No     Risk of unplanned readmission score: Unplanned Admission- Pilot do not use: 14.55   Objective: Blood pressure (!) 100/55, pulse 70, temperature 98.1 F (36.7 C), temperature source Oral, resp. rate 18, height 5' 5.5" (1.664 m), weight 64.9 kg, SpO2 95 %.  Examination: General appearance: alert, cooperative, and no distress Head: Normocephalic, without obvious abnormality, atraumatic Eyes:  EOMI Lungs: clear to auscultation bilaterally Heart: regular rate and rhythm and S1, S2  normal Abdomen: normal findings: bowel sounds normal and soft, non-tender Extremities:  No edema.  Left surgical dressings in place with soft compartments Skin: mobility and turgor normal Neurologic: No focal deficits.  Left lower extremity strength limited by pain from surgery  Consultants:  Ortho surgery  Procedures:    Data Reviewed: I have  personally reviewed following labs and imaging studies Results for orders placed or performed during the hospital encounter of 06/17/21 (from the past 24 hour(s))  CBC     Status: Abnormal   Collection Time: 06/19/21  1:35 AM  Result Value Ref Range   WBC 8.7 4.0 - 10.5 K/uL   RBC 2.91 (L) 3.87 - 5.11 MIL/uL   Hemoglobin 9.1 (L) 12.0 - 15.0 g/dL   HCT 27.1 (L) 36.0 - 46.0 %   MCV 93.1 80.0 - 100.0 fL   MCH 31.3 26.0 - 34.0 pg   MCHC 33.6 30.0 - 36.0 g/dL   RDW 12.8 11.5 - 15.5 %   Platelets 105 (L) 150 - 400 K/uL   nRBC 0.0 0.0 - 0.2 %  Basic metabolic panel     Status: Abnormal   Collection Time: 06/19/21  1:35 AM  Result Value Ref Range   Sodium 136 135 - 145 mmol/L   Potassium 4.5 3.5 - 5.1 mmol/L   Chloride 106 98 - 111 mmol/L   CO2 22 22 - 32 mmol/L   Glucose, Bld 149 (H) 70 - 99 mg/dL   BUN 20 8 - 23 mg/dL   Creatinine, Ser 0.91 0.44 - 1.00 mg/dL   Calcium 8.5 (L) 8.9 - 10.3 mg/dL   GFR, Estimated >60 >60 mL/min   Anion gap 8 5 - 15    Recent Results (from the past 240 hour(s))  Resp Panel by RT-PCR (Flu A&B, Covid) Nasopharyngeal Swab     Status: None   Collection Time: 06/17/21 10:12 PM   Specimen: Nasopharyngeal Swab; Nasopharyngeal(NP) swabs in vial transport medium  Result Value Ref Range Status   SARS Coronavirus 2 by RT PCR NEGATIVE NEGATIVE Final    Comment: (NOTE) SARS-CoV-2 target nucleic acids are NOT DETECTED.  The SARS-CoV-2 RNA is generally detectable in upper respiratory specimens during the acute phase of infection. The lowest concentration of SARS-CoV-2 viral copies this assay can detect is 138 copies/mL. A negative result does not preclude SARS-Cov-2 infection and should not be used as the sole basis for treatment or other patient management decisions. A negative result may occur with  improper specimen collection/handling, submission of specimen other than nasopharyngeal swab, presence of viral mutation(s) within the areas targeted by this assay,  and inadequate number of viral copies(<138 copies/mL). A negative result must be combined with clinical observations, patient history, and epidemiological information. The expected result is Negative.  Fact Sheet for Patients:  EntrepreneurPulse.com.au  Fact Sheet for Healthcare Providers:  IncredibleEmployment.be  This test is no t yet approved or cleared by the [place] FDA and  has been authorized for detection and/or diagnosis of SARS-CoV-2 by FDA under an Emergency Use Authorization (EUA). This EUA will remain  in effect (meaning this test can be used) for the duration of the COVID-19 declaration under Section 564(b)(1) of the Act, 21 U.S.C.section 360bbb-3(b)(1), unless the authorization is terminated  or revoked sooner.       Influenza A by PCR NEGATIVE NEGATIVE Final   Influenza B by PCR NEGATIVE NEGATIVE Final    Comment: (NOTE) The Xpert Xpress SARS-CoV-2/FLU/RSV plus assay is intended as an aid  in the diagnosis of influenza from Nasopharyngeal swab specimens and should not be used as a sole basis for treatment. Nasal washings and aspirates are unacceptable for Xpert Xpress SARS-CoV-2/FLU/RSV testing.  Fact Sheet for Patients: EntrepreneurPulse.com.au  Fact Sheet for Healthcare Providers: IncredibleEmployment.be  This test is not yet approved or cleared by the [place] FDA and has been authorized for detection and/or diagnosis of SARS-CoV-2 by FDA under an Emergency Use Authorization (EUA). This EUA will remain in effect (meaning this test can be used) for the duration of the COVID-19 declaration under Section 564(b)(1) of the Act, 21 U.S.C. section 360bbb-3(b)(1), unless the authorization is terminated or revoked.  Performed at [facility], [place] [address], [place], [place] [address]   Surgical pcr screen     Status: None   Collection Time: 06/18/21  8:32 AM   Specimen:  Nasal Mucosa; Nasal Swab  Result Value Ref Range Status   MRSA, PCR NEGATIVE NEGATIVE Final   Staphylococcus aureus NEGATIVE NEGATIVE Final    Comment: (NOTE) The Xpert SA Assay (FDA approved for NASAL specimens in patients 39 years of age and older), is one component of a comprehensive surveillance program. It is not intended to diagnose infection nor to guide or monitor treatment. Performed at [facility], [place] [address] [place], [place] [address]      Radiology Studies: CT Head Wo Contrast  Result Date: 06/17/2021 CLINICAL DATA:  Fall, on Eliquis EXAM: CT HEAD WITHOUT CONTRAST TECHNIQUE: Contiguous axial images were obtained from the base of the skull through the vertex without intravenous contrast. COMPARISON:  CTA head dated 09/28/2018 FINDINGS: Brain: No evidence of acute infarction, hemorrhage, hydrocephalus, extra-axial collection or mass lesion/mass effect. Global cortical atrophy. Vascular: Intracranial atherosclerosis. Skull: Normal. Negative for fracture or focal lesion. Sinuses/Orbits: The visualized paranasal sinuses are essentially clear. The mastoid air cells are unopacified. Other: None. IMPRESSION: No evidence of acute intracranial abnormality. Mild cortical atrophy. Electronically Signed   By: Julian Hy M.D.   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 Atrial appendage clip. Probable scarring in the right mid lung. Stable enlarged cardiomediastinal silhouette. No acute consolidation, pleural effusion, or pneumothorax. IMPRESSION: Cardiomegaly with slight central congestion. Scarring in the right mid lung. Electronically Signed   By: Donavan Foil M.D.   On: 06/17/2021 22:44   DG Hand Complete Right  Result Date: 06/17/2021 CLINICAL DATA:  Trauma fall EXAM: RIGHT HAND - COMPLETE  3+ VIEW COMPARISON:  None. FINDINGS: Dorsal and ulnar dislocation base of fifth middle phalanx with respect to head of proximal phalanx. No definitive fracture seen. Advanced arthritis at the first [facility] joint and first MCP joint. Triangular fibrocartilage calcification. IMPRESSION: 1. Dislocation at the fifth PIP joint without definitive fracture Electronically Signed   By: Donavan Foil M.D.   On: 06/17/2021 22:40   DG C-Arm 1-60 Min  Result Date: 06/18/2021 CLINICAL DATA:  Elective surgery. Additional history provided: Intramedullary (I a.m.) nail femoral, open reduction internal fixation femur. Provided fluoroscopy time 1 minutes, 59 seconds (15.87 mGy). EXAM: LEFT FEMUR 2 VIEWS; DG C-ARM 1-60 MIN COMPARISON:  Radiographs of the left hip and left femur 06/17/2021. FINDINGS: Eight intraoperative fluoroscopic images of the left femur are submitted. On the provided images, there are findings of interval placement of an intramedullary nail within the left  femur. There are 2 proximal interlocking screws which traverse the femoral head/neck. Two distal interlocking screws are also noted. The hardware traverses a femoral diaphyseal fracture. There is near anatomic alignment. Additionally, a lateral plate and multiple screws traverse the fracture site. IMPRESSION: Eight intraoperative fluoroscopic images of the left femur, as described. Electronically Signed   By: Kellie Simmering DO   On: 06/18/2021 17:22   DG HIP UNILAT WITH PELVIS 2-3 VIEWS LEFT  Result Date: 06/17/2021 CLINICAL DATA:  Fall EXAM: DG HIP (WITH OR WITHOUT PELVIS) 2-3V LEFT COMPARISON:  04/12/2021 FINDINGS: Pubic symphysis and rami are intact. Mild degenerative changes of both hips. Acute displaced and overriding fracture involving proximal shaft of left femur IMPRESSION: Acute displaced and overriding proximal femoral fracture Electronically Signed   By: Donavan Foil M.D.   On: 06/17/2021 23:06   DG Femur 1 View Left  Result Date:  06/17/2021 CLINICAL DATA:  Fall assess for fracture EXAM: LEFT FEMUR 1 VIEW COMPARISON:  None. FINDINGS: Acute mildly comminuted fracture involving the proximal shaft of the femur with greater than 1 shaft diameter posterior and 1/2 shaft diameter central displacement of distal fracture fragment. About 5 cm of overriding. Femoral head appears normally position. Vascular calcification IMPRESSION: Acute mildly comminuted and displaced fracture involving proximal shaft of left femur Electronically Signed   By: Donavan Foil M.D.   On: 06/17/2021 22:42   DG FEMUR MIN 2 VIEWS LEFT  Result Date: 06/18/2021 CLINICAL DATA:  Postop EXAM: LEFT FEMUR 2 VIEWS COMPARISON:  06/17/2021 FINDINGS: Interval intramedullary rod with proximal and distal screw fixation across proximal femoral shaft fracture. Additional small side plate and fixating screws at the site of fracture. Anatomic alignment. 12 mm bone fragment within the medial upper thigh soft tissues. Gas in the soft tissues consistent with recent surgery. IMPRESSION: Status post internal fixation of left femoral shaft fracture with expected postsurgical change Electronically Signed   By: Donavan Foil M.D.   On: 06/18/2021 19:56   DG FEMUR MIN 2 VIEWS LEFT  Result Date: 06/18/2021 CLINICAL DATA:  Elective surgery. Additional history provided: Intramedullary (I a.m.) nail femoral, open reduction internal fixation femur. Provided fluoroscopy time 1 minutes, 59 seconds (15.87 mGy). EXAM: LEFT FEMUR 2 VIEWS; DG C-ARM 1-60 MIN COMPARISON:  Radiographs of the left hip and left femur 06/17/2021. FINDINGS: Eight intraoperative fluoroscopic images of the left femur are submitted. On the provided images, there are findings of interval placement of an intramedullary nail within the left femur. There are 2 proximal interlocking screws which traverse the femoral head/neck. Two distal interlocking screws are also noted. The hardware traverses a femoral diaphyseal fracture. There is  near anatomic alignment. Additionally, a lateral plate and multiple screws traverse the fracture site. IMPRESSION: Eight intraoperative fluoroscopic images of the left femur, as described. Electronically Signed   By: Kellie Simmering DO   On: 06/18/2021 17:22   DG FEMUR MIN 2 VIEWS LEFT  Final Result    DG FEMUR MIN 2 VIEWS LEFT  Final Result    DG C-Arm 1-60 Min  Final Result    CT Head Wo Contrast  Final Result    DG Femur 1 View Left  Final Result    DG Chest Port 1 View  Final Result    DG Hand Complete Right  Final Result    DG HIP UNILAT WITH PELVIS 2-3 VIEWS LEFT  Final Result      Scheduled Meds:  brivaracetam  25 mg Oral Daily   brivaracetam  50 mg Oral QHS   calcium-vitamin D  1 tablet Oral Q breakfast   docusate sodium  100 mg Oral BID   fluticasone  2 spray Each Nare Daily   lamoTRIgine  150 mg Oral BID   metoprolol succinate  25 mg Oral Daily   montelukast  10 mg Oral QHS   pantoprazole  40 mg Oral Daily   rosuvastatin  40 mg Oral Daily   traMADol  50 mg Oral Q6H   PRN Meds: albuterol, bisacodyl, butalbital-acetaminophen-caffeine, fentaNYL (SUBLIMAZE) injection, LORazepam, methocarbamol **OR** methocarbamol (ROBAXIN) IV, metoCLOPramide **OR** metoCLOPramide (REGLAN) injection, ondansetron **OR** ondansetron (ZOFRAN) IV, sodium phosphate Continuous Infusions:  methocarbamol (ROBAXIN) IV       LOS: 2 days  Time spent: Greater than 50% of the 35 minute visit was spent in counseling/coordination of care for the patient as laid out in the A&P.   Dwyane Dee, MD [facility] 06/19/2021, 4:19 PM

## 2021-06-20 ENCOUNTER — Inpatient Hospital Stay (HOSPITAL_COMMUNITY): Payer: Medicare Other

## 2021-06-20 DIAGNOSIS — D62 Acute posthemorrhagic anemia: Secondary | ICD-10-CM

## 2021-06-20 DIAGNOSIS — I959 Hypotension, unspecified: Secondary | ICD-10-CM

## 2021-06-20 DIAGNOSIS — S72352A Displaced comminuted fracture of shaft of left femur, initial encounter for closed fracture: Secondary | ICD-10-CM | POA: Diagnosis not present

## 2021-06-20 LAB — CBC
HCT: 22.5 % — ABNORMAL LOW (ref 36.0–46.0)
Hemoglobin: 7.4 g/dL — ABNORMAL LOW (ref 12.0–15.0)
MCH: 31 pg (ref 26.0–34.0)
MCHC: 32.9 g/dL (ref 30.0–36.0)
MCV: 94.1 fL (ref 80.0–100.0)
Platelets: 80 10*3/uL — ABNORMAL LOW (ref 150–400)
RBC: 2.39 MIL/uL — ABNORMAL LOW (ref 3.87–5.11)
RDW: 12.9 % (ref 11.5–15.5)
WBC: 6.9 10*3/uL (ref 4.0–10.5)
nRBC: 0 % (ref 0.0–0.2)

## 2021-06-20 LAB — BASIC METABOLIC PANEL
Anion gap: 5 (ref 5–15)
BUN: 19 mg/dL (ref 8–23)
CO2: 23 mmol/L (ref 22–32)
Calcium: 7.8 mg/dL — ABNORMAL LOW (ref 8.9–10.3)
Chloride: 105 mmol/L (ref 98–111)
Creatinine, Ser: 0.85 mg/dL (ref 0.44–1.00)
GFR, Estimated: >60 mL/min (ref >60)
Glucose, Bld: 122 mg/dL — ABNORMAL HIGH (ref 70–99)
Potassium: 4 mmol/L (ref 3.5–5.1)
Sodium: 133 mmol/L — ABNORMAL LOW (ref 135–145)

## 2021-06-20 LAB — PREPARE RBC (CROSSMATCH)

## 2021-06-20 LAB — MAGNESIUM: Magnesium: 2.1 mg/dL (ref 1.7–2.4)

## 2021-06-20 MED ORDER — SODIUM CHLORIDE 0.9% IV SOLUTION: Freq: Once | INTRAVENOUS | Status: DC

## 2021-06-20 MED ORDER — APIXABAN 5 MG PO TABS
5.0000 mg | ORAL_TABLET | Freq: Two times a day (BID) | ORAL | Status: DC
  Administered 2021-06-20 – 2021-06-23 (×7): 5 mg via ORAL
  Filled 2021-06-20 (×7): qty 1

## 2021-06-20 MED ORDER — SODIUM CHLORIDE 0.9 % IV BOLUS
1000.0000 mL | Freq: Once | INTRAVENOUS | Status: AC
  Administered 2021-06-20: 1000 mL via INTRAVENOUS

## 2021-06-20 NOTE — TOC Initial Note (Signed)
Transition of Care [facility]) - Initial/Assessment Note    Patient Details  Name: [name] [name] MRN: [id number] Date of Birth: [birth year]  [facility]) CM/SW Contact:    Milinda Antis, [place] Phone Number: 06/20/2021, 12:21 PM  Clinical Narrative:                 CSW received consult for possible SNF placement at time of discharge. CSW spoke with patient.  Patient expressed understanding of PT recommendation and is agreeable to SNF placement at time of discharge. Patient reports preference for [facility]. CSW discussed insurance authorization process.  The patient reported that she has lied at [facility] assisted living for four years and is adamant about going to [place] at the same place.  Patient reports receiving the COVID vaccines.     Expected Discharge Plan: Skilled Nursing Facility Barriers to Discharge: Continued Medical Work up   Patient Goals and CMS Choice Patient states their goals for this hospitalization and ongoing recovery are:: [place] CMS Medicare.gov Compare Post Acute Care list provided to:: Patient Choice offered to / list presented to : Patient  Expected Discharge Plan and Services Expected Discharge Plan: [place]       Living arrangements for the past 2 months: [place]                                      Prior Living Arrangements/Services Living arrangements for the past 2 months: [place] Lives with:: Self Patient language and need for interpreter reviewed:: Yes Do you feel safe going back to the place where you live?: Yes      Need for Family Participation in Patient Care: Yes (Comment) Care giver support system in place?: Yes (comment)   Criminal Activity/Legal Involvement Pertinent to Current Situation/Hospitalization: No - Comment as needed  Activities of Daily Living Home Assistive Devices/Equipment:  None ADL Screening (condition at time of admission) Patient's cognitive ability adequate to safely complete daily activities?: Yes Is the patient deaf or have difficulty hearing?: Yes Does the patient have difficulty seeing, even when wearing glasses/contacts?: Yes Does the patient have difficulty concentrating, remembering, or making decisions?: No Patient able to express need for assistance with ADLs?: Yes Does the patient have difficulty dressing or bathing?: Yes Independently performs ADLs?: No Communication: Independent Dressing (OT): Needs assistance Is this a change from baseline?: Change from baseline, expected to last >3 days Grooming: Needs assistance Feeding: Independent Bathing: Needs assistance Toileting: Needs assistance In/Out Bed: Needs assistance Walks in Home: Needs assistance Is this a change from baseline?: Change from baseline, expected to last >3 days Does the patient have difficulty walking or climbing stairs?: Yes Weakness of Legs: Left Weakness of Arms/Hands: None  Permission Sought/Granted   Permission granted to share information with : Yes, Verbal Permission Granted     Permission granted to share info w AGENCY: [place]        Emotional Assessment Appearance:: Appears stated age Attitude/Demeanor/Rapport: Engaged Affect (typically observed): Accepting, Pleasant, Hopeful Orientation: : Oriented to Self, Oriented to Place, Oriented to  Time, Oriented to Situation Alcohol / Substance Use: Not Applicable Psych Involvement: No (comment)  Admission diagnosis:  Femoral fracture ([place]) [S72.90XA] Fall [W19.XXXA] Dislocation of finger, initial encounter [S50.539J] Other closed fracture of shaft of left femur, initial encounter ([place]) [  S72.392A] Patient Active Problem List   Diagnosis Date Noted   PAF (paroxysmal atrial fibrillation) ([place]) 06/19/2021   Osteoporosis 06/19/2021   Seizure disorder ([place]) 06/19/2021   Hyperlipidemia 06/19/2021    Displaced comminuted fracture of shaft of left femur ([place]) 06/17/2021   PCP:  [name], MD Pharmacy:   [place], [place] [address] [place] [address] Phone: [phone] Fax: [phone]     Social Determinants of Health (SDOH) Interventions    Readmission Risk Interventions No flowsheet data found.

## 2021-06-20 NOTE — Progress Notes (Signed)
Progress Note    ELYSIA [name]   QJF:[id number]  DOB: 10-May-1941  DOA: 06/17/2021     3  PCP: [name], MD  CC: Fall at home  [facility] Course: [name] [name] is a 80 y.o. female with medical history significant for osteoporosis on Prolia twice a year, paroxysmal A. fib on Eliquis, mitral valve prolapse status post mitral valve replacement in 2017, sick sinus syndrome status post pacemaker placement, seizure disorder, essential hypertension, hyperlipidemia, who presented to [facility] ED after a fall at home.   Patient stated that prior to the fall she heard a loud pop and felt it on her left hip.  States that since March 2022 she has had trouble with her left hip after missing a step at her home.  She was going to physical therapy for it.  Her pain was not getting better but worse with therapy, she has been using a cane to ambulate.  Endorses history of osteoporosis for which she receives Prolia twice a year.  She feels that her left hip broke prior to her fall.  She denies any prior cardiopulmonary or GI symptoms.  She did not have her cell phone on her at the time of her fall, she had to crawl on her back to get to the phone to call EMS.  She was brought into the ED for further evaluation.  Work-up in the ED revealed left displaced femoral fracture and dislocation at the fifth PIP joint without definite fracture.   She was evaluated by orthopedic surgery and underwent ORIF on 06/18/2021.  Finger dislocation was also reduced in the OR.  Interval History:  Patient more lethargic this morning.  Blood pressure also had down trended.  Discussed blood transfusion and she was agreeable due to worsening hypotension and hemoglobin downtrend.  ROS: Constitutional: negative for chills and fevers, Respiratory: negative for cough, Cardiovascular: negative, and Gastrointestinal: negative for abdominal pain  Assessment & Plan: * Displaced comminuted fracture of shaft of left femur ([place]) - "Acute  mildly comminuted and displaced fracture involving proximal shaft of left femur" - s/p ORIF with ortho on 06/18/21 - follow up PT eval - continue pain control  - Eliquis on hold; to resume per ortho rec's  Hypotension - Likely from worsening anemia - Difficulty with IV access.  Awaiting placement of new IV this morning.  PRBC should help with volume expansion and can give further IV fluids if needed  Acute blood loss anemia - Likely associated with recent surgery - This morning patient lethargic and hypotensive.  Hemoglobin has down trended to 7.4 g/dL - 1 unit PRBC ordered  Hyperlipidemia - Continue statin  Seizure disorder ([place]) - continue Lamictal and Briviact  Osteoporosis - On Prolia at home twice yearly - Follow-up vitamin D level: 47 (normal) - Continue fall precautions  PAF (paroxysmal atrial fibrillation) (HCC) - Eliquis resumed - Continue Toprol - Continue telemetry  Old records reviewed in assessment of this patient  Antimicrobials:   DVT prophylaxis: Eliquis resumed 06/20/2021 apixaban (ELIQUIS) tablet 5 mg   Code Status:   Code Status: Full Code Family Communication: son  Disposition Plan: Status is: Inpatient  Remains inpatient appropriate because:Ongoing active pain requiring inpatient pain management and Inpatient level of care appropriate due to severity of illness  Dispo:  Patient From: Home  Planned Disposition: [place]  Medically stable for discharge: No     Risk of unplanned readmission score: Unplanned Admission- Pilot do not use: 15.09   Objective:  Blood pressure (!) 94/47, pulse 68, temperature 98.8 F (37.1 C), temperature source Oral, resp. rate 18, height 5' 5.5" (1.664 m), weight 64.9 kg, SpO2 96 %.  Examination: General appearance: alert, cooperative, no distress, and more lethargic appearing Head: Normocephalic, without obvious abnormality, atraumatic Eyes:  EOMI Lungs: clear to auscultation  bilaterally Heart: regular rate and rhythm and S1, S2 normal Abdomen: normal findings: bowel sounds normal and soft, non-tender Extremities:  Some swelling of leg noted.  Left surgical dressings in place with soft compartments Skin: mobility and turgor normal Neurologic: No focal deficits.  Left lower extremity strength limited by pain from surgery  Consultants:  Ortho surgery  Procedures:    Data Reviewed: I have personally reviewed following labs and imaging studies Results for orders placed or performed during the hospital encounter of 06/17/21 (from the past 24 hour(s))  CBC     Status: Abnormal   Collection Time: 06/20/21  2:36 AM  Result Value Ref Range   WBC 6.9 4.0 - 10.5 K/uL   RBC 2.39 (L) 3.87 - 5.11 MIL/uL   Hemoglobin 7.4 (L) 12.0 - 15.0 g/dL   HCT 22.5 (L) 36.0 - 46.0 %   MCV 94.1 80.0 - 100.0 fL   MCH 31.0 26.0 - 34.0 pg   MCHC 32.9 30.0 - 36.0 g/dL   RDW 12.9 11.5 - 15.5 %   Platelets 80 (L) 150 - 400 K/uL   nRBC 0.0 0.0 - 0.2 %  Basic metabolic panel     Status: Abnormal   Collection Time: 06/20/21  2:36 AM  Result Value Ref Range   Sodium 133 (L) 135 - 145 mmol/L   Potassium 4.0 3.5 - 5.1 mmol/L   Chloride 105 98 - 111 mmol/L   CO2 23 22 - 32 mmol/L   Glucose, Bld 122 (H) 70 - 99 mg/dL   BUN 19 8 - 23 mg/dL   Creatinine, Ser 0.85 0.44 - 1.00 mg/dL   Calcium 7.8 (L) 8.9 - 10.3 mg/dL   GFR, Estimated >60 >60 mL/min   Anion gap 5 5 - 15  Magnesium     Status: None   Collection Time: 06/20/21  2:36 AM  Result Value Ref Range   Magnesium 2.1 1.7 - 2.4 mg/dL  Prepare RBC (crossmatch)     Status: None   Collection Time: 06/20/21 12:11 PM  Result Value Ref Range   Order Confirmation      ORDER PROCESSED BY BLOOD BANK Performed at [facility], [address], [place],  [address]     Recent Results (from the past 240 hour(s))  Resp Panel by RT-PCR (Flu A&B, Covid) Nasopharyngeal Swab     Status: None   Collection Time: 06/17/21 10:12 PM    Specimen: Nasopharyngeal Swab; Nasopharyngeal(NP) swabs in vial transport medium  Result Value Ref Range Status   SARS Coronavirus 2 by RT PCR NEGATIVE NEGATIVE Final    Comment: (NOTE) SARS-CoV-2 target nucleic acids are NOT DETECTED.  The SARS-CoV-2 RNA is generally detectable in upper respiratory specimens during the acute phase of infection. The lowest concentration of SARS-CoV-2 viral copies this assay can detect is 138 copies/mL. A negative result does not preclude SARS-Cov-2 infection and should not be used as the sole basis for treatment or other patient management decisions. A negative result may occur with  improper specimen collection/handling, submission of specimen other than nasopharyngeal swab, presence of viral mutation(s) within the areas targeted by this assay, and inadequate number of viral copies(<138  copies/mL). A negative result must be combined with clinical observations, patient history, and epidemiological information. The expected result is Negative.  Fact Sheet for Patients:  EntrepreneurPulse.com.au  Fact Sheet for Healthcare Providers:  IncredibleEmployment.be  This test is no t yet approved or cleared by the [place] FDA and  has been authorized for detection and/or diagnosis of SARS-CoV-2 by FDA under an Emergency Use Authorization (EUA). This EUA will remain  in effect (meaning this test can be used) for the duration of the COVID-19 declaration under Section 564(b)(1) of the Act, 21 U.S.C.section 360bbb-3(b)(1), unless the authorization is terminated  or revoked sooner.       Influenza A by PCR NEGATIVE NEGATIVE Final   Influenza B by PCR NEGATIVE NEGATIVE Final    Comment: (NOTE) The Xpert Xpress SARS-CoV-2/FLU/RSV plus assay is intended as an aid in the diagnosis of influenza from Nasopharyngeal swab specimens and should not be used as a sole basis for treatment. Nasal washings and aspirates are  unacceptable for Xpert Xpress SARS-CoV-2/FLU/RSV testing.  Fact Sheet for Patients: EntrepreneurPulse.com.au  Fact Sheet for Healthcare Providers: IncredibleEmployment.be  This test is not yet approved or cleared by the [place] FDA and has been authorized for detection and/or diagnosis of SARS-CoV-2 by FDA under an Emergency Use Authorization (EUA). This EUA will remain in effect (meaning this test can be used) for the duration of the COVID-19 declaration under Section 564(b)(1) of the Act, 21 U.S.C. section 360bbb-3(b)(1), unless the authorization is terminated or revoked.  Performed at [facility], [place] [address], [place], [place] [address]   Surgical pcr screen     Status: None   Collection Time: 06/18/21  8:32 AM   Specimen: Nasal Mucosa; Nasal Swab  Result Value Ref Range Status   MRSA, PCR NEGATIVE NEGATIVE Final   Staphylococcus aureus NEGATIVE NEGATIVE Final    Comment: (NOTE) The Xpert SA Assay (FDA approved for NASAL specimens in patients 26 years of age and older), is one component of a comprehensive surveillance program. It is not intended to diagnose infection nor to guide or monitor treatment. Performed at [facility], [place] [address], [place], [place] [address]      Radiology Studies: DG Finger Little Right  Result Date: 06/20/2021 CLINICAL DATA:  Dislocation, finger closed, initial encounter. EXAM: RIGHT LITTLE FINGER 2+V COMPARISON:  Hand radiograph 06/18/2019 FINDINGS: The previous fifth digit dislocation at the proximal interphalangeal joint has been reduced. No fracture adjacent to the proximal interphalangeal joint. There is a tiny density about the dorsal aspect of the distal interphalangeal joint that is of unknown acuity. Fifth digit is taped to the fourth digit which limits detailed assessment. IMPRESSION: 1. Interval reduction of the previous fifth digit dislocation at the proximal interphalangeal  joint. No fracture adjacent to the dislocation site. 2. Tiny density about the dorsal aspect of the distal interphalangeal joint may represent a avulsion injury, of unknown acuity. Electronically Signed   By: Keith Rake M.D.   On: 06/20/2021 16:23   DG FEMUR MIN 2 VIEWS LEFT  Result Date: 06/18/2021 CLINICAL DATA:  Postop EXAM: LEFT FEMUR 2 VIEWS COMPARISON:  06/17/2021 FINDINGS: Interval intramedullary rod with proximal and distal screw fixation across proximal femoral shaft fracture. Additional small side plate and fixating screws at the site of fracture. Anatomic alignment. 12 mm bone fragment within the medial upper thigh soft tissues. Gas in the soft tissues consistent with recent surgery. IMPRESSION: Status post internal fixation of left femoral shaft fracture  with expected postsurgical change Electronically Signed   By: Donavan Foil M.D.   On: 06/18/2021 19:56   DG Finger Little Right  Final Result    DG FEMUR MIN 2 VIEWS LEFT  Final Result    DG FEMUR MIN 2 VIEWS LEFT  Final Result    DG C-Arm 1-60 Min  Final Result    CT Head Wo Contrast  Final Result    DG Femur 1 View Left  Final Result    DG Chest Port 1 View  Final Result    DG Hand Complete Right  Final Result    DG HIP UNILAT WITH PELVIS 2-3 VIEWS LEFT  Final Result      Scheduled Meds:  sodium chloride   Intravenous Once   apixaban  5 mg Oral BID   brivaracetam  25 mg Oral Daily   brivaracetam  50 mg Oral QHS   calcium-vitamin D  1 tablet Oral Q breakfast   docusate sodium  100 mg Oral BID   fluticasone  2 spray Each Nare Daily   lamoTRIgine  150 mg Oral BID   metoprolol succinate  25 mg Oral Daily   montelukast  10 mg Oral QHS   pantoprazole  40 mg Oral Daily   rosuvastatin  40 mg Oral Daily   PRN Meds: albuterol, bisacodyl, butalbital-acetaminophen-caffeine, fentaNYL (SUBLIMAZE) injection, LORazepam, methocarbamol **OR** methocarbamol (ROBAXIN) IV, metoCLOPramide **OR** metoCLOPramide (REGLAN)  injection, mineral oil-hydrophilic petrolatum, ondansetron **OR** ondansetron (ZOFRAN) IV, sodium phosphate Continuous Infusions:  methocarbamol (ROBAXIN) IV       LOS: 3 days  Time spent: Greater than 50% of the 35 minute visit was spent in counseling/coordination of care for the patient as laid out in the A&P.   Dwyane Dee, MD [facility] 06/20/2021, 5:13 PM

## 2021-06-20 NOTE — Assessment & Plan Note (Addendum)
-   Likely associated with recent surgery - s/p 1 unit PRBC on 7/20 for Hgb 7.4 g/dL -Lethargy and blood pressure improved - repeat CBC in 2-3 days

## 2021-06-20 NOTE — Assessment & Plan Note (Addendum)
-   Likely from worsening anemia - improved with PRBC on 7/20

## 2021-06-20 NOTE — Progress Notes (Signed)
Physical Therapy Treatment Patient Details Name: [name] [name] MRN: [id number] DOB: 1941/02/09 Today's Date: 06/20/2021    History of Present Illness 80 yo female presents to [facility] on 7/17 after fall at [facility] [facility]. Pt sustained L acute displaced L proximal femur fracture, s/p IMN vs ORIF (op note pending) on 7/18. Pt also with R little finger PIP dislocation, s/p closed reduction. CTH negative for acute findings. PMH includes osteoporosis, PAF, mitral valve prolapse status post mitral valve replacement in 2017, sick sinus syndrome status post pacemaker placement, seizure disorder, essential hypertension, hyperlipidemia.    PT Comments    Pt up OOB upon PT arrival to room, assisted back to bed with min assist for steadying and IV pole management. Pt reports LLE weakness and difficulty repositioning LLE in bed, pt tolerated LLE exercises well to address this. PT also provided pt with gait belt fashioned as a foot loop to help with exercises and repositioning. PT to continue to follow.     Follow Up Recommendations  SNF;Supervision for mobility/OOB     Equipment Recommendations  None recommended by PT (next venue)    Recommendations for Other Services       Precautions / Restrictions Precautions Precautions: Fall Restrictions Weight Bearing Restrictions: No LLE Weight Bearing: Weight bearing as tolerated    Mobility  Bed Mobility Overal bed mobility: Needs Assistance Bed Mobility: Sit to Supine       Sit to supine: Min assist;HOB elevated   General bed mobility comments: Pt OOB upon PT arrival, min assist for return to supine for LE lifting into bed, repositioning, and boost up.    Transfers Overall transfer level: Needs assistance Equipment used: Rolling walker (2 wheeled) Transfers: Sit to/from Stand Sit to Stand: Min assist         General transfer comment: min assist for move from stand>sit for slow eccentric lower  Ambulation/Gait Ambulation/Gait assistance: Min  assist Gait Distance (Feet): 10 Feet Assistive device: Rolling walker (2 wheeled) Gait Pattern/deviations: Step-through pattern;Decreased stride length;Trunk flexed;Antalgic Gait velocity: decr   General Gait Details: min assist to steady, guide RW. Verbal cuing for RW navigation   Stairs             Wheelchair Mobility    Modified Rankin (Stroke Patients Only)       Balance Overall balance assessment: Needs assistance Sitting-balance support: Bilateral upper extremity supported Sitting balance-Leahy Scale: Fair     Standing balance support: Bilateral upper extremity supported;During functional activity Standing balance-Leahy Scale: Poor Standing balance comment: reliant on external assist                            Cognition Arousal/Alertness: Awake/alert Behavior During Therapy: WFL for tasks assessed/performed Overall Cognitive Status: Within Functional Limits for tasks assessed                                        Exercises General Exercises - Lower Extremity Ankle Circles/Pumps: AROM;Both;10 reps;Seated Quad Sets: AROM;Left;10 reps;Supine Heel Slides: AAROM;Left;10 reps;Supine (1/2 with active assist from PT, 1/2 with pt using gait belt over foot) Hip ABduction/ADduction: AAROM;Left;10 reps;Supine    General Comments        Pertinent Vitals/Pain Pain Assessment: Faces Faces Pain Scale: Hurts a little bit Pain Location: LLE Pain Descriptors / Indicators: Aching;Discomfort;Grimacing Pain Intervention(s): Limited activity within patient's tolerance;Monitored during session;Repositioned  Home Living                      Prior Function            PT Goals (current goals can now be found in the care plan section) Acute Rehab PT Goals Patient Stated Goal: walk more, put my own pants on without assistance PT Goal Formulation: With patient Time For Goal Achievement: 07/03/21 Potential to Achieve Goals:  Good Progress towards PT goals: Progressing toward goals    Frequency    Min 3X/week      PT Plan Current plan remains appropriate    Co-evaluation              AM-PAC PT "6 Clicks" Mobility   Outcome Measure  Help needed turning from your back to your side while in a flat bed without using bedrails?: A Little Help needed moving from lying on your back to sitting on the side of a flat bed without using bedrails?: A Little Help needed moving to and from a bed to a chair (including a wheelchair)?: A Little Help needed standing up from a chair using your arms (e.g., wheelchair or bedside chair)?: A Little Help needed to walk in hospital room?: A Little Help needed climbing 3-5 steps with a railing? : A Lot 6 Click Score: 17    End of Session Equipment Utilized During Treatment:  (already walking upon PT arrival to room, did not wait for assist from NT to get back to bed from bathroom) Activity Tolerance: Patient tolerated treatment well Patient left: with call bell/phone within reach;in bed;with bed alarm set Nurse Communication: Mobility status;Other (comment) (IV beeping) PT Visit Diagnosis: Other abnormalities of gait and mobility (R26.89);Muscle weakness (generalized) (M62.81)     Time: 1511-1530 PT Time Calculation (min) (ACUTE ONLY): 19 min  Charges:  $Therapeutic Exercise: 8-22 mins                    Stacie Glaze, PT DPT Acute Rehabilitation Services Pager [phone]  Office [phone]    Louis Matte 06/20/2021, 4:47 PM

## 2021-06-20 NOTE — NC FL2 (Signed)
[place] LEVEL OF CARE SCREENING TOOL     IDENTIFICATION  Patient Name: [name] [name] Birthdate: 11-01-1941 Sex: female Admission Date (Current Location): 06/17/2021  [facility] and [place] Number:  Herbalist and Address:  [facility] [place], [place] [address] [place], [place] [address]      Provider Number: [id number]  Attending Physician Name and Address:  Dwyane Dee, MD  Relative Name and Phone Number:  Herman,Cheryl (Daughter)   [phone]    Current Level of Care: Hospital Recommended Level of Care: [place] Prior Approval Number:    Date Approved/Denied:   PASRR Number: [id number]  Discharge Plan: SNF    Current Diagnoses: Patient Active Problem List   Diagnosis Date Noted   PAF (paroxysmal atrial fibrillation) ([place]) 06/19/2021   Osteoporosis 06/19/2021   Seizure disorder ([place]) 06/19/2021   Hyperlipidemia 06/19/2021   Displaced comminuted fracture of shaft of left femur ([place]) 06/17/2021    Orientation RESPIRATION BLADDER Height & Weight     Self, Time, Situation, Place  Normal Continent Weight: 143 lb (64.9 kg) Height:  5' 5.5" (166.4 cm)  BEHAVIORAL SYMPTOMS/MOOD NEUROLOGICAL BOWEL NUTRITION STATUS      Continent Diet (see d/c summary)  AMBULATORY STATUS COMMUNICATION OF NEEDS Skin   Limited Assist Verbally Surgical wounds                       Personal Care Assistance Level of Assistance  Bathing, Feeding Bathing Assistance: Limited assistance Feeding assistance: Independent       Functional Limitations Info  Sight, Hearing, Speech Sight Info: Impaired Hearing Info: Adequate Speech Info: Adequate    SPECIAL CARE FACTORS FREQUENCY  PT (By licensed PT), OT (By licensed OT)     PT Frequency: 5x/ week OT Frequency: 5x/ week            Contractures Contractures Info: Not present    Additional Factors Info  Code Status, Allergies Code Status Info: Full Allergies Info:  Codeine   Diphen (Diphenhydramine Hcl)   Doxycycline   Hydrocodone   Hydromorphone   Mobic (Meloxicam)   Nsaids   Oxycodone   Propofol   Tylenol (Acetaminophen           Current Medications (06/20/2021):  This is the current hospital active medication list Current Facility-Administered Medications  Medication Dose Route Frequency Provider Last Rate Last Admin   0.9 %  sodium chloride infusion (Manually program via Guardrails IV Fluids)   Intravenous Once Dwyane Dee, MD       albuterol (VENTOLIN HFA) 108 (90 Base) MCG/ACT inhaler 2 puff  2 puff Inhalation Q6H PRN Altamese [place], MD       apixaban Arne [place]) tablet 5 mg  5 mg Oral BID Altamese [place], MD   5 mg at 06/20/21 0940   bisacodyl (DULCOLAX) suppository 10 mg  10 mg Rectal Daily PRN Altamese [place], MD       brivaracetam (BRIVIACT) tablet 25 mg  25 mg Oral Daily Altamese [place], MD   25 mg at 06/20/21 0940   brivaracetam (BRIVIACT) tablet 50 mg  50 mg Oral Faith Rogue, MD   50 mg at 06/19/21 2127   butalbital-acetaminophen-caffeine (FIORICET) 50-325-40 MG per tablet 1-2 tablet  1-2 tablet Oral Q6H PRN Caren Griffins, MD   2 tablet at 06/20/21 3532   calcium-vitamin D (OSCAL WITH D) 500-200 MG-UNIT per tablet 1 tablet  1 tablet Oral Q breakfast Altamese [place], MD  1 tablet at 06/20/21 0940   docusate sodium (COLACE) capsule 100 mg  100 mg Oral BID Altamese [place], MD   100 mg at 06/20/21 0940   fentaNYL (SUBLIMAZE) injection 25 mcg  25 mcg Intravenous Q2H PRN Altamese [place], MD   25 mcg at 06/19/21 1613   fluticasone (FLONASE) 50 MCG/ACT nasal spray 2 spray  2 spray Each Nare Daily Altamese [place], MD   2 spray at 06/20/21 0941   lamoTRIgine (LAMICTAL) tablet 150 mg  150 mg Oral BID Altamese [place], MD   150 mg at 06/20/21 0940   LORazepam (ATIVAN) injection 2 mg  2 mg Intravenous Q4H PRN Altamese [place], MD       methocarbamol (ROBAXIN) tablet 500 mg  500 mg Oral Q6H PRN Altamese [place], MD       Or   methocarbamol (ROBAXIN) 500  mg in dextrose 5 % 50 mL IVPB  500 mg Intravenous Q6H PRN Altamese [place], MD       metoCLOPramide (REGLAN) tablet 5-10 mg  5-10 mg Oral Q8H PRN Altamese [place], MD       Or   metoCLOPramide (REGLAN) injection 5-10 mg  5-10 mg Intravenous Q8H PRN Altamese [place], MD       metoprolol succinate (TOPROL-XL) 24 hr tablet 25 mg  25 mg Oral Daily Dwyane Dee, MD   25 mg at 06/19/21 1962   mineral oil-hydrophilic petrolatum (AQUAPHOR) ointment   Topical PRN Kathryne Eriksson, NP       montelukast (SINGULAIR) tablet 10 mg  10 mg Oral Faith Rogue, MD   10 mg at 06/19/21 2127   ondansetron (ZOFRAN) tablet 4 mg  4 mg Oral Q6H PRN Altamese [place], MD       Or   ondansetron [facility]) injection 4 mg  4 mg Intravenous Q6H PRN Altamese [place], MD       pantoprazole (PROTONIX) EC tablet 40 mg  40 mg Oral Daily Altamese [place], MD   40 mg at 06/20/21 0940   rosuvastatin (CRESTOR) tablet 40 mg  40 mg Oral Daily Altamese [place], MD   40 mg at 06/20/21 0940   sodium phosphate (FLEET) 7-19 GM/118ML enema 1 enema  1 enema Rectal Once PRN Altamese , MD         Discharge Medications: Please see discharge summary for a list of discharge medications.  Relevant Imaging Results:  Relevant Lab Results:   Additional Information SSN:  [id number] Sarina Robleto, LCSWA

## 2021-06-20 NOTE — Progress Notes (Signed)
Orthopaedic Trauma Service (OTS)  2 Days Post-Op Procedure(s) (LRB): INTRAMEDULLARY (IM) NAIL FEMORAL, OPEN REDUCTION INTERNAL FIXATION FEMUR RIGHT LITTLE FINGER PIP CLOSED REDUCTION (Left)  Subjective: Patient reports pain as  mild but difficulty moving left leg .    Objective: Current Vitals Blood pressure (!) 94/47, pulse 68, temperature 98.8 F (37.1 C), temperature source Oral, resp. rate 18, height 5' 5.5" (1.664 m), weight 64.9 kg, SpO2 96 %. Vital signs in last 24 hours: Temp:  [98.1 F (36.7 C)-98.8 F (37.1 C)] 98.8 F (37.1 C) (07/20 1156) Pulse Rate:  [68-78] 68 (07/20 1156) Resp:  [17-18] 18 (07/20 1156) BP: (90-100)/(47-55) 94/47 (07/20 1156) SpO2:  [95 %-99 %] 96 % (07/20 1156)  Intake/Output from previous day: 07/19 0701 - 07/20 0700 In: 360 [P.O.:360] Out: 400 [Urine:400]  LABS Recent Labs    06/17/21 2211 06/17/21 2220 06/18/21 0318 06/19/21 0135 06/20/21 0236  HGB 11.0* 10.9* 10.3* 9.1* 7.4*   Recent Labs    06/19/21 0135 06/20/21 0236  WBC 8.7 6.9  RBC 2.91* 2.39*  HCT 27.1* 22.5*  PLT 105* 80*   Recent Labs    06/19/21 0135 06/20/21 0236  NA 136 133*  K 4.5 4.0  CL 106 105  CO2 22 23  BUN 20 19  CREATININE 0.91 0.85  GLUCOSE 149* 122*  CALCIUM 8.5* 7.8*   Recent Labs    06/17/21 2211 06/18/21 0318  INR 1.0 1.2     Physical Exam LLE  Dressing intact, clean, dry  Edema/ swelling controlled  Sens: DPN, SPN, TN intact  Motor: EHL, FHL, and lessor toe ext and flex all intact grossly  Brisk cap refill, warm to touch Right hand with good motion  Assessment/Plan: 2 Days Post-Op Procedure(s) (LRB): INTRAMEDULLARY (IM) NAIL FEMORAL, OPEN REDUCTION INTERNAL FIXATION FEMUR RIGHT LITTLE FINGER PIP CLOSED REDUCTION (Left) 1. PT/OT WBAT no motion restrictions 2. DVT proph Other (comment) Eliquis 3. F/u 8-14 days back to [facility] 4. I will be on vacation until Tue; please call my office with questions  Altamese Johnson,  MD [facility], [facility] [phone]

## 2021-06-21 DIAGNOSIS — S72352A Displaced comminuted fracture of shaft of left femur, initial encounter for closed fracture: Secondary | ICD-10-CM | POA: Diagnosis not present

## 2021-06-21 DIAGNOSIS — D62 Acute posthemorrhagic anemia: Secondary | ICD-10-CM | POA: Diagnosis not present

## 2021-06-21 LAB — CBC WITH DIFFERENTIAL/PLATELET
Abs Immature Granulocytes: 0.03 10*3/uL (ref 0.00–0.07)
Basophils Absolute: 0 10*3/uL (ref 0.0–0.1)
Basophils Relative: 1 %
Eosinophils Absolute: 0.1 10*3/uL (ref 0.0–0.5)
Eosinophils Relative: 2 %
HCT: 23.7 % — ABNORMAL LOW (ref 36.0–46.0)
Hemoglobin: 8 g/dL — ABNORMAL LOW (ref 12.0–15.0)
Immature Granulocytes: 1 %
Lymphocytes Relative: 31 %
Lymphs Abs: 2 10*3/uL (ref 0.7–4.0)
MCH: 31.7 pg (ref 26.0–34.0)
MCHC: 33.8 g/dL (ref 30.0–36.0)
MCV: 94 fL (ref 80.0–100.0)
Monocytes Absolute: 0.5 10*3/uL (ref 0.1–1.0)
Monocytes Relative: 8 %
Neutro Abs: 3.8 10*3/uL (ref 1.7–7.7)
Neutrophils Relative %: 57 %
Platelets: 87 10*3/uL — ABNORMAL LOW (ref 150–400)
RBC: 2.52 MIL/uL — ABNORMAL LOW (ref 3.87–5.11)
RDW: 13.4 % (ref 11.5–15.5)
WBC: 6.4 10*3/uL (ref 4.0–10.5)
nRBC: 0 % (ref 0.0–0.2)

## 2021-06-21 LAB — RESP PANEL BY RT-PCR (FLU A&B, COVID) ARPGX2
Influenza A by PCR: NEGATIVE
Influenza B by PCR: NEGATIVE
SARS Coronavirus 2 by RT PCR: NEGATIVE

## 2021-06-21 LAB — BASIC METABOLIC PANEL
Anion gap: 7 (ref 5–15)
BUN: 13 mg/dL (ref 8–23)
CO2: 24 mmol/L (ref 22–32)
Calcium: 7.7 mg/dL — ABNORMAL LOW (ref 8.9–10.3)
Chloride: 104 mmol/L (ref 98–111)
Creatinine, Ser: 0.82 mg/dL (ref 0.44–1.00)
GFR, Estimated: >60 mL/min (ref >60)
Glucose, Bld: 100 mg/dL — ABNORMAL HIGH (ref 70–99)
Potassium: 3.7 mmol/L (ref 3.5–5.1)
Sodium: 135 mmol/L (ref 135–145)

## 2021-06-21 LAB — BPAM RBC
Blood Product Expiration Date: 202208132359
ISSUE DATE / TIME: 202207201716
Unit Type and Rh: 6200

## 2021-06-21 LAB — TYPE AND SCREEN
ABO/RH(D): A POS
Antibody Screen: NEGATIVE
Unit division: 0

## 2021-06-21 LAB — MAGNESIUM: Magnesium: 2.1 mg/dL (ref 1.7–2.4)

## 2021-06-21 NOTE — Progress Notes (Signed)
Progress Note    [name] [name]   JIR:[id number]  DOB: 07-16-1941  DOA: 06/17/2021     4  PCP: [name], MD  CC: Fall at home  [facility] Course: [name] [name] is a 80 y.o. female with medical history significant for osteoporosis on Prolia twice a year, paroxysmal A. fib on Eliquis, mitral valve prolapse status post mitral valve replacement in 2017, sick sinus syndrome status post pacemaker placement, seizure disorder, essential hypertension, hyperlipidemia, who presented to [facility] ED after a fall at home.   Patient stated that prior to the fall she heard a loud pop and felt it on her left hip.  States that since March 2022 she has had trouble with her left hip after missing a step at her home.  She was going to physical therapy for it.  Her pain was not getting better but worse with therapy, she has been using a cane to ambulate.  Endorses history of osteoporosis for which she receives Prolia twice a year.  She feels that her left hip broke prior to her fall.  She denies any prior cardiopulmonary or GI symptoms.  She did not have her cell phone on her at the time of her fall, she had to crawl on her back to get to the phone to call EMS.  She was brought into the ED for further evaluation.  Work-up in the ED revealed left displaced femoral fracture and dislocation at the fifth PIP joint without definite fracture.   She was evaluated by orthopedic surgery and underwent ORIF on 06/18/2021.  Finger dislocation was also reduced in the OR.  Interval History:  Energy is much improved after blood yesterday.  Blood pressure has also improved.  Some further swelling noted in left lower extremity which she knows to keep an eye on.  ROS: Constitutional: negative for chills and fevers, Respiratory: negative for cough, Cardiovascular: negative, and Gastrointestinal: negative for abdominal pain  Assessment & Plan: * Displaced comminuted fracture of shaft of left femur ([place]) - "Acute mildly  comminuted and displaced fracture involving proximal shaft of left femur" - s/p ORIF with ortho on 06/18/21 - follow up PT eval: SNF recommended - continue pain control  - continue Eliquis; resumed 7/20  Hypotension - Likely from worsening anemia - improved with PRBC on 7/20  Acute blood loss anemia - Likely associated with recent surgery - s/p 1 unit PRBC on 7/20 for Hgb 7.4 g/dL -Lethargy and blood pressure improved this morning - CBC daily  Hyperlipidemia - Continue statin  Seizure disorder ([place]) - continue Lamictal and Briviact  Osteoporosis - On Prolia at home twice yearly - Follow-up vitamin D level: 47 (normal) - Continue fall precautions  PAF (paroxysmal atrial fibrillation) ([place]) - Eliquis resumed - Continue Toprol - Continue telemetry  Old records reviewed in assessment of this patient  Antimicrobials:   DVT prophylaxis: Eliquis resumed 06/20/2021 apixaban (ELIQUIS) tablet 5 mg   Code Status:   Code Status: Full Code Family Communication: son  Disposition Plan: Status is: Inpatient  Remains inpatient appropriate because:Ongoing active pain requiring inpatient pain management and Inpatient level of care appropriate due to severity of illness  Dispo:  Patient From: Home  Planned Disposition: [place]  Medically stable for discharge: No     Risk of unplanned readmission score: Unplanned Admission- Pilot do not use: 13.93   Objective: Blood pressure (!) 107/58, pulse 83, temperature 98.2 F (36.8 C), temperature source Oral, resp. rate 17, height 5' 5.5" (  1.664 m), weight 64.9 kg, SpO2 96 %.  Examination: General appearance: alert, cooperative, and no distress Head: Normocephalic, without obvious abnormality, atraumatic Eyes:  EOMI Lungs: clear to auscultation bilaterally Heart: regular rate and rhythm and S1, S2 normal Abdomen: normal findings: bowel sounds normal and soft, non-tender Extremities:  Some further increase in  swelling noted of the left lower extremity.  Compartment remains soft and warm/well-perfused foot Skin: mobility and turgor normal Neurologic: No focal deficits.  Left lower extremity strength limited by pain from surgery  Consultants:  Ortho surgery  Procedures:    Data Reviewed: I have personally reviewed following labs and imaging studies Results for orders placed or performed during the hospital encounter of 06/17/21 (from the past 24 hour(s))  Magnesium     Status: None   Collection Time: 06/21/21  2:56 AM  Result Value Ref Range   Magnesium 2.1 1.7 - 2.4 mg/dL  Basic metabolic panel     Status: Abnormal   Collection Time: 06/21/21  2:56 AM  Result Value Ref Range   Sodium 135 135 - 145 mmol/L   Potassium 3.7 3.5 - 5.1 mmol/L   Chloride 104 98 - 111 mmol/L   CO2 24 22 - 32 mmol/L   Glucose, Bld 100 (H) 70 - 99 mg/dL   BUN 13 8 - 23 mg/dL   Creatinine, Ser 0.82 0.44 - 1.00 mg/dL   Calcium 7.7 (L) 8.9 - 10.3 mg/dL   GFR, Estimated >60 >60 mL/min   Anion gap 7 5 - 15  CBC with Differential/Platelet     Status: Abnormal   Collection Time: 06/21/21  2:56 AM  Result Value Ref Range   WBC 6.4 4.0 - 10.5 K/uL   RBC 2.52 (L) 3.87 - 5.11 MIL/uL   Hemoglobin 8.0 (L) 12.0 - 15.0 g/dL   HCT 23.7 (L) 36.0 - 46.0 %   MCV 94.0 80.0 - 100.0 fL   MCH 31.7 26.0 - 34.0 pg   MCHC 33.8 30.0 - 36.0 g/dL   RDW 13.4 11.5 - 15.5 %   Platelets 87 (L) 150 - 400 K/uL   nRBC 0.0 0.0 - 0.2 %   Neutrophils Relative % 57 %   Neutro Abs 3.8 1.7 - 7.7 K/uL   Lymphocytes Relative 31 %   Lymphs Abs 2.0 0.7 - 4.0 K/uL   Monocytes Relative 8 %   Monocytes Absolute 0.5 0.1 - 1.0 K/uL   Eosinophils Relative 2 %   Eosinophils Absolute 0.1 0.0 - 0.5 K/uL   Basophils Relative 1 %   Basophils Absolute 0.0 0.0 - 0.1 K/uL   Immature Granulocytes 1 %   Abs Immature Granulocytes 0.03 0.00 - 0.07 K/uL  Resp Panel by RT-PCR (Flu A&B, Covid) Nasopharyngeal Swab     Status: None   Collection Time: 06/21/21  11:21 AM   Specimen: Nasopharyngeal Swab; Nasopharyngeal(NP) swabs in vial transport medium  Result Value Ref Range   SARS Coronavirus 2 by RT PCR NEGATIVE NEGATIVE   Influenza A by PCR NEGATIVE NEGATIVE   Influenza B by PCR NEGATIVE NEGATIVE    Recent Results (from the past 240 hour(s))  Resp Panel by RT-PCR (Flu A&B, Covid) Nasopharyngeal Swab     Status: None   Collection Time: 06/17/21 10:12 PM   Specimen: Nasopharyngeal Swab; Nasopharyngeal(NP) swabs in vial transport medium  Result Value Ref Range Status   SARS Coronavirus 2 by RT PCR NEGATIVE NEGATIVE Final    Comment: (NOTE) SARS-CoV-2 target nucleic acids are NOT DETECTED.  The SARS-CoV-2 RNA is generally detectable in upper respiratory specimens during the acute phase of infection. The lowest concentration of SARS-CoV-2 viral copies this assay can detect is 138 copies/mL. A negative result does not preclude SARS-Cov-2 infection and should not be used as the sole basis for treatment or other patient management decisions. A negative result may occur with  improper specimen collection/handling, submission of specimen other than nasopharyngeal swab, presence of viral mutation(s) within the areas targeted by this assay, and inadequate number of viral copies(<138 copies/mL). A negative result must be combined with clinical observations, patient history, and epidemiological information. The expected result is Negative.  Fact Sheet for Patients:  EntrepreneurPulse.com.au  Fact Sheet for Healthcare Providers:  IncredibleEmployment.be  This test is no t yet approved or cleared by the [place] FDA and  has been authorized for detection and/or diagnosis of SARS-CoV-2 by FDA under an Emergency Use Authorization (EUA). This EUA will remain  in effect (meaning this test can be used) for the duration of the COVID-19 declaration under Section 564(b)(1) of the Act, 21 U.S.C.section  360bbb-3(b)(1), unless the authorization is terminated  or revoked sooner.       Influenza A by PCR NEGATIVE NEGATIVE Final   Influenza B by PCR NEGATIVE NEGATIVE Final    Comment: (NOTE) The Xpert Xpress SARS-CoV-2/FLU/RSV plus assay is intended as an aid in the diagnosis of influenza from Nasopharyngeal swab specimens and should not be used as a sole basis for treatment. Nasal washings and aspirates are unacceptable for Xpert Xpress SARS-CoV-2/FLU/RSV testing.  Fact Sheet for Patients: EntrepreneurPulse.com.au  Fact Sheet for Healthcare Providers: IncredibleEmployment.be  This test is not yet approved or cleared by the [place] FDA and has been authorized for detection and/or diagnosis of SARS-CoV-2 by FDA under an Emergency Use Authorization (EUA). This EUA will remain in effect (meaning this test can be used) for the duration of the COVID-19 declaration under Section 564(b)(1) of the Act, 21 U.S.C. section 360bbb-3(b)(1), unless the authorization is terminated or revoked.  Performed at [facility], [place] [address] [place], [place] [address]   Surgical pcr screen     Status: None   Collection Time: 06/18/21  8:32 AM   Specimen: Nasal Mucosa; Nasal Swab  Result Value Ref Range Status   MRSA, PCR NEGATIVE NEGATIVE Final   Staphylococcus aureus NEGATIVE NEGATIVE Final    Comment: (NOTE) The Xpert SA Assay (FDA approved for NASAL specimens in patients 73 years of age and older), is one component of a comprehensive surveillance program. It is not intended to diagnose infection nor to guide or monitor treatment. Performed at [facility], [place] [address] [place], [place] [address]   Resp Panel by RT-PCR (Flu A&B, Covid) Nasopharyngeal Swab     Status: None   Collection Time: 06/21/21 11:21 AM   Specimen: Nasopharyngeal Swab; Nasopharyngeal(NP) swabs in vial transport medium  Result Value Ref Range Status   SARS  Coronavirus 2 by RT PCR NEGATIVE NEGATIVE Final    Comment: (NOTE) SARS-CoV-2 target nucleic acids are NOT DETECTED.  The SARS-CoV-2 RNA is generally detectable in upper respiratory specimens during the acute phase of infection. The lowest concentration of SARS-CoV-2 viral copies this assay can detect is 138 copies/mL. A negative result does not preclude SARS-Cov-2 infection and should not be used as the sole basis for treatment or other patient management decisions. A negative result may occur with  improper specimen collection/handling, submission of specimen other than nasopharyngeal swab, presence  of viral mutation(s) within the areas targeted by this assay, and inadequate number of viral copies(<138 copies/mL). A negative result must be combined with clinical observations, patient history, and epidemiological information. The expected result is Negative.  Fact Sheet for Patients:  EntrepreneurPulse.com.au  Fact Sheet for Healthcare Providers:  IncredibleEmployment.be  This test is no t yet approved or cleared by the [place] FDA and  has been authorized for detection and/or diagnosis of SARS-CoV-2 by FDA under an Emergency Use Authorization (EUA). This EUA will remain  in effect (meaning this test can be used) for the duration of the COVID-19 declaration under Section 564(b)(1) of the Act, 21 U.S.C.section 360bbb-3(b)(1), unless the authorization is terminated  or revoked sooner.       Influenza A by PCR NEGATIVE NEGATIVE Final   Influenza B by PCR NEGATIVE NEGATIVE Final    Comment: (NOTE) The Xpert Xpress SARS-CoV-2/FLU/RSV plus assay is intended as an aid in the diagnosis of influenza from Nasopharyngeal swab specimens and should not be used as a sole basis for treatment. Nasal washings and aspirates are unacceptable for Xpert Xpress SARS-CoV-2/FLU/RSV testing.  Fact Sheet for  Patients: EntrepreneurPulse.com.au  Fact Sheet for Healthcare Providers: IncredibleEmployment.be  This test is not yet approved or cleared by the [place] FDA and has been authorized for detection and/or diagnosis of SARS-CoV-2 by FDA under an Emergency Use Authorization (EUA). This EUA will remain in effect (meaning this test can be used) for the duration of the COVID-19 declaration under Section 564(b)(1) of the Act, 21 U.S.C. section 360bbb-3(b)(1), unless the authorization is terminated or revoked.  Performed at [facility], [place] [address] [place], [place] [address]      Radiology Studies: DG Finger Little Right  Result Date: 06/20/2021 CLINICAL DATA:  Dislocation, finger closed, initial encounter. EXAM: RIGHT LITTLE FINGER 2+V COMPARISON:  Hand radiograph 06/18/2019 FINDINGS: The previous fifth digit dislocation at the proximal interphalangeal joint has been reduced. No fracture adjacent to the proximal interphalangeal joint. There is a tiny density about the dorsal aspect of the distal interphalangeal joint that is of unknown acuity. Fifth digit is taped to the fourth digit which limits detailed assessment. IMPRESSION: 1. Interval reduction of the previous fifth digit dislocation at the proximal interphalangeal joint. No fracture adjacent to the dislocation site. 2. Tiny density about the dorsal aspect of the distal interphalangeal joint may represent a avulsion injury, of unknown acuity. Electronically Signed   By: Keith Rake M.D.   On: 06/20/2021 16:23   DG Finger Little Right  Final Result    DG FEMUR MIN 2 VIEWS LEFT  Final Result    DG FEMUR MIN 2 VIEWS LEFT  Final Result    DG C-Arm 1-60 Min  Final Result    CT Head Wo Contrast  Final Result    DG Femur 1 View Left  Final Result    DG Chest Port 1 View  Final Result    DG Hand Complete Right  Final Result    DG HIP UNILAT WITH PELVIS 2-3 VIEWS LEFT   Final Result      Scheduled Meds:  sodium chloride   Intravenous Once   apixaban  5 mg Oral BID   brivaracetam  25 mg Oral Daily   brivaracetam  50 mg Oral QHS   calcium-vitamin D  1 tablet Oral Q breakfast   docusate sodium  100 mg Oral BID   fluticasone  2 spray Each Nare Daily   lamoTRIgine  150 mg Oral BID   metoprolol succinate  25 mg Oral Daily   montelukast  10 mg Oral QHS   pantoprazole  40 mg Oral Daily   rosuvastatin  40 mg Oral Daily   PRN Meds: albuterol, bisacodyl, butalbital-acetaminophen-caffeine, fentaNYL (SUBLIMAZE) injection, LORazepam, methocarbamol **OR** methocarbamol (ROBAXIN) IV, metoCLOPramide **OR** metoCLOPramide (REGLAN) injection, mineral oil-hydrophilic petrolatum, ondansetron **OR** ondansetron (ZOFRAN) IV, sodium phosphate Continuous Infusions:  methocarbamol (ROBAXIN) IV       LOS: 4 days  Time spent: Greater than 50% of the 35 minute visit was spent in counseling/coordination of care for the patient as laid out in the A&P.   Dwyane Dee, MD [facility] 06/21/2021, 2:34 PM

## 2021-06-21 NOTE — Care Management Important Message (Signed)
Important Message  Patient Details  Name: [name] [name] MRN: [id number] Date of Birth: 07/10/1941   Medicare Important Message Given:  Yes     Orbie Pyo 06/21/2021, 4:27 PM

## 2021-06-21 NOTE — Progress Notes (Signed)
Occupational Therapy Treatment Patient Details Name: [name] [name] MRN: [id number] DOB: 1941-05-17 Today's Date: 06/21/2021    History of present illness 80 yo female presents to [facility] on 7/17 after fall at [place]. Pt sustained L acute displaced L proximal femur fracture, s/p IMN vs ORIF (op note pending) on 7/18. Pt also with R little finger PIP dislocation, s/p closed reduction. CTH negative for acute findings. PMH includes osteoporosis, PAF, mitral valve prolapse status post mitral valve replacement in 2017, sick sinus syndrome status post pacemaker placement, seizure disorder, essential hypertension, hyperlipidemia.   OT comments  Patient agreeable to getting out of bed, wanting to brush teeth. Patient utilize gait belt as leg loop to assist L LE out of bed and did not require physical assist however relies on head of bed up and bed rail. Patient overall min G assist with rolling walker to ambulate to bathroom, transfer on/off commode over toilet and then ambulate to recliner. Patient supervision level for sink side g/h tasks, reliant on at least unilateral support on sink. No overt loss of balance during ADLs, does require increased time due to L LE discomfort "I'm still not putting full weight on it." Continue to recommend short term rehab at D/C, acute OT to follow.    Follow Up Recommendations  SNF    Equipment Recommendations  3 in 1 bedside commode;Tub/shower seat;Other (comment) (rolling walker (if does not have these items))       Precautions / Restrictions Precautions Precautions: Fall Restrictions LLE Weight Bearing: Weight bearing as tolerated       Mobility Bed Mobility Overal bed mobility: Needs Assistance Bed Mobility: Supine to Sit     Supine to sit: Supervision;HOB elevated     General bed mobility comments: head of bed significantly elevated, patient uses leg loop to assist L LE out of bed. increased time but did not need physical assistance     Transfers Overall transfer level: Needs assistance Equipment used: Rolling walker (2 wheeled) Transfers: Sit to/from Stand Sit to Stand: Min guard         General transfer comment: min guard for safety when powering up to standing    Balance Overall balance assessment: Needs assistance Sitting-balance support: Feet supported Sitting balance-Leahy Scale: Good     Standing balance support: Single extremity supported;Bilateral upper extremity supported Standing balance-Leahy Scale: Poor Standing balance comment: reliant on at least unilateral support                           ADL either performed or assessed with clinical judgement   ADL Overall ADL's : Needs assistance/impaired     Grooming: Wash/dry face;Wash/dry hands;Brushing hair;Oral care;Supervision/safety;Standing Grooming Details (indicate cue type and reason): patient tolerate standing sink side to perform grooming/hygiene tasks with no loss of balance, does rely on unilateral support on sink                 Toilet Transfer: Min guard;Ambulation;RW;Comfort height toilet Toilet Transfer Details (indicate cue type and reason): commode over toilet, min G for safety with ambulation to/from bathroom. demonstrates safe hand placement with sit <> stand Toileting- Clothing Manipulation and Hygiene: Min guard;Sitting/lateral lean;Sit to/from stand Toileting - Clothing Manipulation Details (indicate cue type and reason): patient able to perform peri care after voiding, does need verbal cues to remember to pull mesh underwear up before ambulating to sink     Functional mobility during ADLs: Min guard;Rolling walker;Cueing for safety General ADL Comments:  patient progressing well with functional ambulation and transfers for self care tasks      Cognition Arousal/Alertness: Awake/alert Behavior During Therapy: [facility] for tasks assessed/performed Overall Cognitive Status: Within Functional Limits for tasks  assessed                                                     Pertinent Vitals/ Pain       Pain Assessment: Faces Faces Pain Scale: Hurts a little bit Pain Location: LLE Pain Descriptors / Indicators: Aching Pain Intervention(s): Monitored during session         Frequency  Min 2X/week        Progress Toward Goals  OT Goals(current goals can now be found in the care plan section)  Progress towards OT goals: Progressing toward goals  Acute Rehab OT Goals Patient Stated Goal: walk more, put my own pants on without assistance OT Goal Formulation: With patient/family Time For Goal Achievement: 07/03/21 Potential to Achieve Goals: Good ADL Goals Pt Will Perform Grooming: with supervision;with set-up;sitting Pt Will Perform Upper Body Bathing: with supervision;with set-up;sitting Pt Will Perform Lower Body Bathing: with mod assist;with min assist;sitting/lateral leans Pt Will Perform Upper Body Dressing: with supervision;with set-up;sitting Pt Will Transfer to Toilet: with max assist;stand pivot transfer;bedside commode  Plan Discharge plan remains appropriate       AM-PAC OT "6 Clicks" Daily Activity     Outcome Measure   Help from another person eating meals?: None Help from another person taking care of personal grooming?: A Little Help from another person toileting, which includes using toliet, bedpan, or urinal?: A Little Help from another person bathing (including washing, rinsing, drying)?: A Lot Help from another person to put on and taking off regular upper body clothing?: A Little Help from another person to put on and taking off regular lower body clothing?: A Lot 6 Click Score: 17    End of Session Equipment Utilized During Treatment: Rolling walker  OT Visit Diagnosis: Other abnormalities of gait and mobility (R26.89);History of falling (Z91.81);Muscle weakness (generalized) (M62.81);Pain Pain - Right/Left: Left Pain - part of body:  Hip;Leg   Activity Tolerance Patient tolerated treatment well   Patient Left in chair;with call bell/phone within reach;with chair alarm set   Nurse Communication Mobility status        Time: 1497-0263 OT Time Calculation (min): 25 min  Charges: OT General Charges $OT Visit: 1 Visit OT Treatments $Self Care/Home Management : 23-37 mins  Delbert Phenix OT OT pager: [phone]   Rosemary Holms 06/21/2021, 12:38 PM

## 2021-06-21 NOTE — Telephone Encounter (Signed)
Patient returned the call stating that she has been in [facility] for a broken Femur. She states that she will not be able to go to [facility] to pick up the medication so she will contact [facility] Oppen[facility] and [facility] to see if they can do a transfer and if [facility] could deliver the medication to her home. She states that she had called the pharmacy with a old bottle of Fioricet and that was why she requested the refills. Pt verbalized understanding that she still had refills left of Fioricet and had no other questions.

## 2021-06-21 NOTE — Telephone Encounter (Signed)
Left a message to call back.

## 2021-06-21 NOTE — Care Management Important Message (Signed)
Important Message  Patient Details  Name: [name] [name] MRN: [id number] Date of Birth: 29-Apr-1941   Medicare Important Message Given:  Yes     Orbie Pyo 06/21/2021, 8:54 AM

## 2021-06-21 NOTE — Progress Notes (Signed)
Physical Therapy Treatment Patient Details Name: [name] [name] MRN: [id number] DOB: 1940/12/06 Today's Date: 06/21/2021    History of Present Illness 80 yo female presents to [facility] on 7/17 after fall at [place]. Pt sustained L acute displaced L proximal femur fracture, s/p IMN vs ORIF (op note pending) on 7/18. Pt also with R little finger PIP dislocation, s/p closed reduction. CTH negative for acute findings. PMH includes osteoporosis, PAF, mitral valve prolapse status post mitral valve replacement in 2017, sick sinus syndrome status post pacemaker placement, seizure disorder, essential hypertension, hyperlipidemia.    PT Comments    Pt supine in bed this session.  Pt performed exercises in supine, transfer training and progression of gt training.  She continues to fatigue quickly with activity but remains motivated.  Pt continues to benefit from skilled rehab facility to maximize functional gains to return to ILF.     Follow Up Recommendations  SNF;Supervision for mobility/OOB     Equipment Recommendations       Recommendations for Other Services       Precautions / Restrictions Precautions Precautions: Fall Precaution Comments: R distal digits buddy taped from dislocation. Restrictions Weight Bearing Restrictions: Yes LLE Weight Bearing: Weight bearing as tolerated    Mobility  Bed Mobility Overal bed mobility: Needs Assistance Bed Mobility: Supine to Sit     Supine to sit: Supervision;HOB elevated (with use of gt belt strap to move to edge of bed.) Sit to supine: Supervision;HOB elevated   General bed mobility comments: Pt continues to use gt belt as a leg lifter.   Pt able to utilize belt to perform without physical assistance.    Transfers Overall transfer level: Needs assistance Equipment used: Rolling walker (2 wheeled) Transfers: Sit to/from Stand Sit to Stand: Min guard         General transfer comment: Cues for hand placement to and from seated  surface.  Ambulation/Gait Ambulation/Gait assistance: Min guard Gait Distance (Feet): 12 Feet (+ 60 ft x 2) Assistive device: Rolling walker (2 wheeled) Gait Pattern/deviations: Step-through pattern;Decreased stride length;Trunk flexed;Antalgic Gait velocity: decr   General Gait Details: min assist to steady, guide RW. Verbal cuing for RW navigation   Stairs             Wheelchair Mobility    Modified Rankin (Stroke Patients Only)       Balance Overall balance assessment: Needs assistance Sitting-balance support: Feet supported Sitting balance-Leahy Scale: Good     Standing balance support: Single extremity supported;Bilateral upper extremity supported Standing balance-Leahy Scale: Poor Standing balance comment: reliant on at least unilateral support                            Cognition Arousal/Alertness: Awake/alert Behavior During Therapy: WFL for tasks assessed/performed Overall Cognitive Status: Within Functional Limits for tasks assessed                                        Exercises General Exercises - Lower Extremity Ankle Circles/Pumps: AROM;Both;10 reps;Seated Quad Sets: AROM;Left;10 reps;Supine Short Arc Quad: AROM;Left;10 reps;Supine Heel Slides: AAROM;Left;10 reps;Supine Hip ABduction/ADduction: AAROM;Left;10 reps;Supine    General Comments        Pertinent Vitals/Pain Pain Assessment: Faces Faces Pain Scale: Hurts a little bit Pain Location: LLE Pain Descriptors / Indicators: Aching Pain Intervention(s): Monitored during session;Repositioned    Home Living  Prior Function            PT Goals (current goals can now be found in the care plan section) Acute Rehab PT Goals Patient Stated Goal: walk more, put my own pants on without assistance Potential to Achieve Goals: Good Progress towards PT goals: Progressing toward goals    Frequency    Min 3X/week      PT Plan  Current plan remains appropriate    Co-evaluation              AM-PAC PT "6 Clicks" Mobility   Outcome Measure  Help needed turning from your back to your side while in a flat bed without using bedrails?: A Little Help needed moving from lying on your back to sitting on the side of a flat bed without using bedrails?: A Little Help needed moving to and from a bed to a chair (including a wheelchair)?: A Little Help needed standing up from a chair using your arms (e.g., wheelchair or bedside chair)?: A Little Help needed to walk in hospital room?: A Little Help needed climbing 3-5 steps with a railing? : A Lot 6 Click Score: 17    End of Session Equipment Utilized During Treatment: Gait belt Activity Tolerance: Patient tolerated treatment well Patient left: with call bell/phone within reach;in bed;with bed alarm set Nurse Communication: Mobility status;Other (comment) PT Visit Diagnosis: Other abnormalities of gait and mobility (R26.89);Muscle weakness (generalized) (M62.81)     Time: [id number] PT Time Calculation (min) (ACUTE ONLY): 38 min  Charges:  $Gait Training: 8-22 mins $Therapeutic Exercise: 8-22 mins $Therapeutic Activity: 8-22 mins                     Erasmo Leventhal , PTA Acute Rehabilitation Services Pager [phone] Office [phone]    Colletta Spillers Eli Hose 06/21/2021, 3:33 PM

## 2021-06-21 NOTE — Plan of Care (Signed)

## 2021-06-22 DIAGNOSIS — S72352A Displaced comminuted fracture of shaft of left femur, initial encounter for closed fracture: Secondary | ICD-10-CM | POA: Diagnosis not present

## 2021-06-22 LAB — CBC WITH DIFFERENTIAL/PLATELET
Abs Immature Granulocytes: 0.02 10*3/uL (ref 0.00–0.07)
Basophils Absolute: 0 10*3/uL (ref 0.0–0.1)
Basophils Relative: 0 %
Eosinophils Absolute: 0.1 10*3/uL (ref 0.0–0.5)
Eosinophils Relative: 2 %
HCT: 23.6 % — ABNORMAL LOW (ref 36.0–46.0)
Hemoglobin: 8 g/dL — ABNORMAL LOW (ref 12.0–15.0)
Immature Granulocytes: 0 %
Lymphocytes Relative: 29 %
Lymphs Abs: 1.8 10*3/uL (ref 0.7–4.0)
MCH: 31.7 pg (ref 26.0–34.0)
MCHC: 33.9 g/dL (ref 30.0–36.0)
MCV: 93.7 fL (ref 80.0–100.0)
Monocytes Absolute: 0.4 10*3/uL (ref 0.1–1.0)
Monocytes Relative: 7 %
Neutro Abs: 3.8 10*3/uL (ref 1.7–7.7)
Neutrophils Relative %: 62 %
Platelets: 101 10*3/uL — ABNORMAL LOW (ref 150–400)
RBC: 2.52 MIL/uL — ABNORMAL LOW (ref 3.87–5.11)
RDW: 13.5 % (ref 11.5–15.5)
WBC: 6.2 10*3/uL (ref 4.0–10.5)
nRBC: 0 % (ref 0.0–0.2)

## 2021-06-22 LAB — BASIC METABOLIC PANEL
Anion gap: 5 (ref 5–15)
BUN: 16 mg/dL (ref 8–23)
CO2: 24 mmol/L (ref 22–32)
Calcium: 7.8 mg/dL — ABNORMAL LOW (ref 8.9–10.3)
Chloride: 105 mmol/L (ref 98–111)
Creatinine, Ser: 0.74 mg/dL (ref 0.44–1.00)
GFR, Estimated: >60 mL/min (ref >60)
Glucose, Bld: 102 mg/dL — ABNORMAL HIGH (ref 70–99)
Potassium: 3.4 mmol/L — ABNORMAL LOW (ref 3.5–5.1)
Sodium: 134 mmol/L — ABNORMAL LOW (ref 135–145)

## 2021-06-22 LAB — MAGNESIUM: Magnesium: 2.1 mg/dL (ref 1.7–2.4)

## 2021-06-22 MED ORDER — POTASSIUM CHLORIDE CRYS ER 20 MEQ PO TBCR
40.0000 meq | EXTENDED_RELEASE_TABLET | Freq: Once | ORAL | Status: AC
  Administered 2021-06-22: 40 meq via ORAL
  Filled 2021-06-22: qty 2

## 2021-06-22 NOTE — Progress Notes (Signed)
Physical Therapy Treatment Patient Details Name: [name] [name] MRN: [id number] DOB: Jun 30, 1941 Today's Date: 06/22/2021    History of Present Illness 80 yo female presents to [facility] on 7/17 after fall at [place]. Pt sustained L acute displaced L proximal femur fracture, s/p IMN vs ORIF (op note pending) on 7/18. Pt also with R little finger PIP dislocation, s/p closed reduction. CTH negative for acute findings. PMH includes osteoporosis, PAF, mitral valve prolapse status post mitral valve replacement in 2017, sick sinus syndrome status post pacemaker placement, seizure disorder, essential hypertension, hyperlipidemia.    PT Comments    Pt reports feeling "wiped", but agreeable to therapy session. Pt ambulatory for moderate hallway distance, requires mod cuing for posture and RW use. Pt tolerating LE exercises well, remains motivated to progress mobility. Will continue to follow.   Follow Up Recommendations  SNF;Supervision for mobility/OOB     Equipment Recommendations       Recommendations for Other Services       Precautions / Restrictions Precautions Precautions: Fall Precaution Comments: R distal digits buddy taped from dislocation. Restrictions Weight Bearing Restrictions: No LLE Weight Bearing: Weight bearing as tolerated    Mobility  Bed Mobility Overal bed mobility: Needs Assistance Bed Mobility: Supine to Sit;Sit to Supine     Supine to sit: HOB elevated;Min assist (with use of gt belt strap to move to edge of bed.) Sit to supine: HOB elevated;Min assist   General bed mobility comments: min assist for lifting LLE into/out of bed.    Transfers Overall transfer level: Needs assistance Equipment used: Rolling walker (2 wheeled) Transfers: Sit to/from Stand Sit to Stand: Min guard         General transfer comment: close guard for safety, verbal cuing for hand placement when rising/sitting. STS x3, from EOB x2 and BSC over toilet  x1.  Ambulation/Gait Ambulation/Gait assistance: Min guard Gait Distance (Feet): 100 Feet Assistive device: Rolling walker (2 wheeled) Gait Pattern/deviations: Step-through pattern;Decreased stride length;Trunk flexed;Antalgic Gait velocity: decr   General Gait Details: min guard for safety, verbal cuing for upright posture, placement in RW, relaxing shoulders as pt keeps bilat shoulders elevated.   Stairs             Wheelchair Mobility    Modified Rankin (Stroke Patients Only)       Balance Overall balance assessment: Needs assistance Sitting-balance support: Feet supported Sitting balance-Leahy Scale: Good     Standing balance support: Single extremity supported;Bilateral upper extremity supported Standing balance-Leahy Scale: Poor Standing balance comment: reliant on at least unilateral support                            Cognition Arousal/Alertness: Awake/alert Behavior During Therapy: WFL for tasks assessed/performed Overall Cognitive Status: Within Functional Limits for tasks assessed                                        Exercises General Exercises - Lower Extremity Ankle Circles/Pumps: AROM;Both;20 reps;Supine Short Arc Quad: AROM;Both;10 reps;Supine Heel Slides: AAROM;Left;10 reps;Supine    General Comments        Pertinent Vitals/Pain Pain Assessment: Faces Faces Pain Scale: Hurts little more Pain Location: LLE, during bed mobility Pain Descriptors / Indicators: Sore;Discomfort;Grimacing Pain Intervention(s): Monitored during session;Repositioned;Limited activity within patient's tolerance    Home Living  Prior Function            PT Goals (current goals can now be found in the care plan section) Acute Rehab PT Goals Patient Stated Goal: walk more, put my own pants on without assistance PT Goal Formulation: With patient Time For Goal Achievement: 07/03/21 Potential to Achieve  Goals: Good Progress towards PT goals: Progressing toward goals    Frequency    Min 3X/week      PT Plan Current plan remains appropriate    Co-evaluation              AM-PAC PT "6 Clicks" Mobility   Outcome Measure  Help needed turning from your back to your side while in a flat bed without using bedrails?: A Little Help needed moving from lying on your back to sitting on the side of a flat bed without using bedrails?: A Little Help needed moving to and from a bed to a chair (including a wheelchair)?: A Little Help needed standing up from a chair using your arms (e.g., wheelchair or bedside chair)?: A Little Help needed to walk in hospital room?: A Little Help needed climbing 3-5 steps with a railing? : A Lot 6 Click Score: 17    End of Session   Activity Tolerance: Patient tolerated treatment well Patient left: with call bell/phone within reach;in bed;with bed alarm set Nurse Communication: Mobility status PT Visit Diagnosis: Other abnormalities of gait and mobility (R26.89);Muscle weakness (generalized) (M62.81)     Time: [id number] PT Time Calculation (min) (ACUTE ONLY): 34 min  Charges:  $Gait Training: 8-22 mins $Therapeutic Exercise: 8-22 mins                     Stacie Glaze, PT DPT Acute Rehabilitation Services Pager [phone]  Office [phone]    [place] 06/22/2021, 10:27 AM

## 2021-06-22 NOTE — TOC Progression Note (Signed)
Transition of Care [facility]) - Progression Note    Patient Details  Name: BARBY KANNING MRN: [id number] Date of Birth: 05-20-1941  Transition of Care [facility]) CM/SW Contact  Milinda Antis, [place] Phone Number: 06/22/2021, 2:44 PM  Clinical Narrative:     CSW received notification from attending that the patient will be ready to d/c tomorrow.  CSW spoke with Seth Bake with [facility] and they patient can be accepted if the facility receives the d/c summary before 8am.  CSW informed the attending and weekend CSW of this information.    Expected Discharge Plan: [place] Barriers to Discharge: Continued Medical Work up  Expected Discharge Plan and Services Expected Discharge Plan: [place] arrangements for the past 2 months: [place]                                       Social Determinants of Health (SDOH) Interventions    Readmission Risk Interventions No flowsheet data found.

## 2021-06-22 NOTE — Progress Notes (Signed)
Progress Note    [name] [name]   [id number]  DOB: 1941-11-26  DOA: 06/17/2021     5  PCP: [name], MD  CC: Fall at home  [facility] Course: [name] [name] is a 80 y.o. female with medical history significant for osteoporosis on Prolia twice a year, paroxysmal A. fib on Eliquis, mitral valve prolapse status post mitral valve replacement in 2017, sick sinus syndrome status post pacemaker placement, seizure disorder, essential hypertension, hyperlipidemia, who presented to [facility] ED after a fall at home.   Patient stated that prior to the fall she heard a loud pop and felt it on her left hip.  States that since March 2022 she has had trouble with her left hip after missing a step at her home.  She was going to physical therapy for it.  Her pain was not getting better but worse with therapy, she has been using a cane to ambulate.  Endorses history of osteoporosis for which she receives Prolia twice a year.  She feels that her left hip broke prior to her fall.  She denies any prior cardiopulmonary or GI symptoms.  She did not have her cell phone on her at the time of her fall, she had to crawl on her back to get to the phone to call EMS.  She was brought into the ED for further evaluation.  Work-up in the ED revealed left displaced femoral fracture and dislocation at the fifth PIP joint without definite fracture.   She was evaluated by orthopedic surgery and underwent ORIF on 06/18/2021.  Finger dislocation was also reduced in the OR.  Interval History:  No events overnight.  She was sleeping in bed this morning but arouses easily.  Lethargy has improved but she felt tired from prior PT session. We discussed plan for discharging tomorrow to rehab, she is amenable to this.  ROS: Constitutional: negative for chills and fevers, Respiratory: negative for cough, Cardiovascular: negative, and Gastrointestinal: negative for abdominal pain  Assessment & Plan: * Displaced comminuted fracture of  shaft of left femur ([place]) - "Acute mildly comminuted and displaced fracture involving proximal shaft of left femur" - s/p ORIF with ortho on 06/18/21 - follow up PT eval: SNF recommended - continue pain control  - continue Eliquis; resumed 7/20  Hypotension - Likely from worsening anemia - improved with PRBC on 7/20  Acute blood loss anemia - Likely associated with recent surgery - s/p 1 unit PRBC on 7/20 for Hgb 7.4 g/dL -Lethargy and blood pressure improved - CBC daily  Hyperlipidemia - Continue statin  Seizure disorder ([place]) - continue Lamictal and Briviact  Osteoporosis - On Prolia at home twice yearly - Follow-up vitamin D level: 47 (normal) - Continue fall precautions  PAF (paroxysmal atrial fibrillation) ([place]) - Eliquis resumed - Continue Toprol - Continue telemetry  Old records reviewed in assessment of this patient  Antimicrobials:   DVT prophylaxis: Eliquis resumed 06/20/2021 apixaban (ELIQUIS) tablet 5 mg   Code Status:   Code Status: Full Code Family Communication: son  Disposition Plan: Status is: Inpatient  Remains inpatient appropriate because:Ongoing active pain requiring inpatient pain management and Inpatient level of care appropriate due to severity of illness  Dispo:  Patient From: Home  Planned Disposition: [place]  Medically stable for discharge: No     Risk of unplanned readmission score: Unplanned Admission- Pilot do not use: 14.18   Objective: Blood pressure 108/60, pulse 75, temperature 98.5 F (36.9 C), temperature source Oral,  resp. rate 17, height 5' 5.5" (1.664 m), weight 64.9 kg, SpO2 96 %.  Examination: General appearance: alert, cooperative, and no distress Head: Normocephalic, without obvious abnormality, atraumatic Eyes:  EOMI Lungs: clear to auscultation bilaterally Heart: regular rate and rhythm and S1, S2 normal Abdomen: normal findings: bowel sounds normal and soft, non-tender Extremities:   Stable swelling noted in left thigh and the leg.  Compartments still remains soft and foot is warm/well-perfused Skin: mobility and turgor normal Neurologic: No focal deficits.  Left lower extremity strength limited by pain from surgery  Consultants:  Ortho surgery  Procedures:    Data Reviewed: I have personally reviewed following labs and imaging studies Results for orders placed or performed during the hospital encounter of 06/17/21 (from the past 24 hour(s))  Magnesium     Status: None   Collection Time: 06/22/21  4:25 AM  Result Value Ref Range   Magnesium 2.1 1.7 - 2.4 mg/dL  Basic metabolic panel     Status: Abnormal   Collection Time: 06/22/21  4:25 AM  Result Value Ref Range   Sodium 134 (L) 135 - 145 mmol/L   Potassium 3.4 (L) 3.5 - 5.1 mmol/L   Chloride 105 98 - 111 mmol/L   CO2 24 22 - 32 mmol/L   Glucose, Bld 102 (H) 70 - 99 mg/dL   BUN 16 8 - 23 mg/dL   Creatinine, Ser 0.74 0.44 - 1.00 mg/dL   Calcium 7.8 (L) 8.9 - 10.3 mg/dL   GFR, Estimated >60 >60 mL/min   Anion gap 5 5 - 15  CBC with Differential/Platelet     Status: Abnormal   Collection Time: 06/22/21  4:25 AM  Result Value Ref Range   WBC 6.2 4.0 - 10.5 K/uL   RBC 2.52 (L) 3.87 - 5.11 MIL/uL   Hemoglobin 8.0 (L) 12.0 - 15.0 g/dL   HCT 23.6 (L) 36.0 - 46.0 %   MCV 93.7 80.0 - 100.0 fL   MCH 31.7 26.0 - 34.0 pg   MCHC 33.9 30.0 - 36.0 g/dL   RDW 13.5 11.5 - 15.5 %   Platelets 101 (L) 150 - 400 K/uL   nRBC 0.0 0.0 - 0.2 %   Neutrophils Relative % 62 %   Neutro Abs 3.8 1.7 - 7.7 K/uL   Lymphocytes Relative 29 %   Lymphs Abs 1.8 0.7 - 4.0 K/uL   Monocytes Relative 7 %   Monocytes Absolute 0.4 0.1 - 1.0 K/uL   Eosinophils Relative 2 %   Eosinophils Absolute 0.1 0.0 - 0.5 K/uL   Basophils Relative 0 %   Basophils Absolute 0.0 0.0 - 0.1 K/uL   Immature Granulocytes 0 %   Abs Immature Granulocytes 0.02 0.00 - 0.07 K/uL    Recent Results (from the past 240 hour(s))  Resp Panel by RT-PCR (Flu A&B,  Covid) Nasopharyngeal Swab     Status: None   Collection Time: 06/17/21 10:12 PM   Specimen: Nasopharyngeal Swab; Nasopharyngeal(NP) swabs in vial transport medium  Result Value Ref Range Status   SARS Coronavirus 2 by RT PCR NEGATIVE NEGATIVE Final    Comment: (NOTE) SARS-CoV-2 target nucleic acids are NOT DETECTED.  The SARS-CoV-2 RNA is generally detectable in upper respiratory specimens during the acute phase of infection. The lowest concentration of SARS-CoV-2 viral copies this assay can detect is 138 copies/mL. A negative result does not preclude SARS-Cov-2 infection and should not be used as the sole basis for treatment or other patient management decisions. A negative  result may occur with  improper specimen collection/handling, submission of specimen other than nasopharyngeal swab, presence of viral mutation(s) within the areas targeted by this assay, and inadequate number of viral copies(<138 copies/mL). A negative result must be combined with clinical observations, patient history, and epidemiological information. The expected result is Negative.  Fact Sheet for Patients:  EntrepreneurPulse.com.au  Fact Sheet for Healthcare Providers:  IncredibleEmployment.be  This test is no t yet approved or cleared by the [place] FDA and  has been authorized for detection and/or diagnosis of SARS-CoV-2 by FDA under an Emergency Use Authorization (EUA). This EUA will remain  in effect (meaning this test can be used) for the duration of the COVID-19 declaration under Section 564(b)(1) of the Act, 21 U.S.C.section 360bbb-3(b)(1), unless the authorization is terminated  or revoked sooner.       Influenza A by PCR NEGATIVE NEGATIVE Final   Influenza B by PCR NEGATIVE NEGATIVE Final    Comment: (NOTE) The Xpert Xpress SARS-CoV-2/FLU/RSV plus assay is intended as an aid in the diagnosis of influenza from Nasopharyngeal swab specimens and should  not be used as a sole basis for treatment. Nasal washings and aspirates are unacceptable for Xpert Xpress SARS-CoV-2/FLU/RSV testing.  Fact Sheet for Patients: EntrepreneurPulse.com.au  Fact Sheet for Healthcare Providers: IncredibleEmployment.be  This test is not yet approved or cleared by the [place] FDA and has been authorized for detection and/or diagnosis of SARS-CoV-2 by FDA under an Emergency Use Authorization (EUA). This EUA will remain in effect (meaning this test can be used) for the duration of the COVID-19 declaration under Section 564(b)(1) of the Act, 21 U.S.C. section 360bbb-3(b)(1), unless the authorization is terminated or revoked.  Performed at [facility], [place] [address] [place], [place] [address]   Surgical pcr screen     Status: None   Collection Time: 06/18/21  8:32 AM   Specimen: Nasal Mucosa; Nasal Swab  Result Value Ref Range Status   MRSA, PCR NEGATIVE NEGATIVE Final   Staphylococcus aureus NEGATIVE NEGATIVE Final    Comment: (NOTE) The Xpert SA Assay (FDA approved for NASAL specimens in patients 43 years of age and older), is one component of a comprehensive surveillance program. It is not intended to diagnose infection nor to guide or monitor treatment. Performed at [facility], [place] [address] [place], [place] [address]   Resp Panel by RT-PCR (Flu A&B, Covid) Nasopharyngeal Swab     Status: None   Collection Time: 06/21/21 11:21 AM   Specimen: Nasopharyngeal Swab; Nasopharyngeal(NP) swabs in vial transport medium  Result Value Ref Range Status   SARS Coronavirus 2 by RT PCR NEGATIVE NEGATIVE Final    Comment: (NOTE) SARS-CoV-2 target nucleic acids are NOT DETECTED.  The SARS-CoV-2 RNA is generally detectable in upper respiratory specimens during the acute phase of infection. The lowest concentration of SARS-CoV-2 viral copies this assay can detect is 138 copies/mL. A negative result does  not preclude SARS-Cov-2 infection and should not be used as the sole basis for treatment or other patient management decisions. A negative result may occur with  improper specimen collection/handling, submission of specimen other than nasopharyngeal swab, presence of viral mutation(s) within the areas targeted by this assay, and inadequate number of viral copies(<138 copies/mL). A negative result must be combined with clinical observations, patient history, and epidemiological information. The expected result is Negative.  Fact Sheet for Patients:  EntrepreneurPulse.com.au  Fact Sheet for Healthcare Providers:  IncredibleEmployment.be  This test is no  t yet approved or cleared by the [place] and  has been authorized for detection and/or diagnosis of SARS-CoV-2 by FDA under an Emergency Use Authorization (EUA). This EUA will remain  in effect (meaning this test can be used) for the duration of the COVID-19 declaration under Section 564(b)(1) of the Act, 21 U.S.C.section 360bbb-3(b)(1), unless the authorization is terminated  or revoked sooner.       Influenza A by PCR NEGATIVE NEGATIVE Final   Influenza B by PCR NEGATIVE NEGATIVE Final    Comment: (NOTE) The Xpert Xpress SARS-CoV-2/FLU/RSV plus assay is intended as an aid in the diagnosis of influenza from Nasopharyngeal swab specimens and should not be used as a sole basis for treatment. Nasal washings and aspirates are unacceptable for Xpert Xpress SARS-CoV-2/FLU/RSV testing.  Fact Sheet for Patients: EntrepreneurPulse.com.au  Fact Sheet for Healthcare Providers: IncredibleEmployment.be  This test is not yet approved or cleared by the [place] FDA and has been authorized for detection and/or diagnosis of SARS-CoV-2 by FDA under an Emergency Use Authorization (EUA). This EUA will remain in effect (meaning this test can be used) for the  duration of the COVID-19 declaration under Section 564(b)(1) of the Act, 21 U.S.C. section 360bbb-3(b)(1), unless the authorization is terminated or revoked.  Performed at [facility], [place] [address] [place], [place] [address]      Radiology Studies: No results found. DG Finger [place] Right  Final Result    DG FEMUR MIN 2 VIEWS LEFT  Final Result    DG FEMUR MIN 2 VIEWS LEFT  Final Result    DG C-Arm 1-60 Min  Final Result    CT Head Wo Contrast  Final Result    DG Femur 1 View Left  Final Result    DG Chest Port 1 View  Final Result    DG Hand Complete Right  Final Result    DG HIP UNILAT WITH PELVIS 2-3 VIEWS LEFT  Final Result      Scheduled Meds:  sodium chloride   Intravenous Once   apixaban  5 mg Oral BID   brivaracetam  25 mg Oral Daily   brivaracetam  50 mg Oral QHS   calcium-vitamin D  1 tablet Oral Q breakfast   docusate sodium  100 mg Oral BID   fluticasone  2 spray Each Nare Daily   lamoTRIgine  150 mg Oral BID   metoprolol succinate  25 mg Oral Daily   montelukast  10 mg Oral QHS   pantoprazole  40 mg Oral Daily   rosuvastatin  40 mg Oral Daily   PRN Meds: albuterol, bisacodyl, butalbital-acetaminophen-caffeine, fentaNYL (SUBLIMAZE) injection, LORazepam, methocarbamol **OR** methocarbamol (ROBAXIN) IV, metoCLOPramide **OR** metoCLOPramide (REGLAN) injection, mineral oil-hydrophilic petrolatum, ondansetron **OR** ondansetron (ZOFRAN) IV, sodium phosphate Continuous Infusions:  methocarbamol (ROBAXIN) IV       LOS: 5 days  Time spent: Greater than 50% of the 35 minute visit was spent in counseling/coordination of care for the patient as laid out in the A&P.   Dwyane Dee, MD [facility] 06/22/2021, 4:36 PM

## 2021-06-23 DIAGNOSIS — S72352A Displaced comminuted fracture of shaft of left femur, initial encounter for closed fracture: Secondary | ICD-10-CM | POA: Diagnosis not present

## 2021-06-23 DIAGNOSIS — D62 Acute posthemorrhagic anemia: Secondary | ICD-10-CM | POA: Diagnosis not present

## 2021-06-23 LAB — CBC WITH DIFFERENTIAL/PLATELET
Abs Immature Granulocytes: 0.03 10*3/uL (ref 0.00–0.07)
Basophils Absolute: 0 10*3/uL (ref 0.0–0.1)
Basophils Relative: 0 %
Eosinophils Absolute: 0.1 10*3/uL (ref 0.0–0.5)
Eosinophils Relative: 1 %
HCT: 21.8 % — ABNORMAL LOW (ref 36.0–46.0)
Hemoglobin: 7.4 g/dL — ABNORMAL LOW (ref 12.0–15.0)
Immature Granulocytes: 0 %
Lymphocytes Relative: 18 %
Lymphs Abs: 1.6 10*3/uL (ref 0.7–4.0)
MCH: 31.8 pg (ref 26.0–34.0)
MCHC: 33.9 g/dL (ref 30.0–36.0)
MCV: 93.6 fL (ref 80.0–100.0)
Monocytes Absolute: 0.4 10*3/uL (ref 0.1–1.0)
Monocytes Relative: 5 %
Neutro Abs: 6.5 10*3/uL (ref 1.7–7.7)
Neutrophils Relative %: 76 %
Platelets: 110 10*3/uL — ABNORMAL LOW (ref 150–400)
RBC: 2.33 MIL/uL — ABNORMAL LOW (ref 3.87–5.11)
RDW: 13.6 % (ref 11.5–15.5)
WBC: 8.6 10*3/uL (ref 4.0–10.5)
nRBC: 0 % (ref 0.0–0.2)

## 2021-06-23 LAB — BASIC METABOLIC PANEL
Anion gap: 7 (ref 5–15)
BUN: 15 mg/dL (ref 8–23)
CO2: 24 mmol/L (ref 22–32)
Calcium: 7.8 mg/dL — ABNORMAL LOW (ref 8.9–10.3)
Chloride: 101 mmol/L (ref 98–111)
Creatinine, Ser: 0.85 mg/dL (ref 0.44–1.00)
GFR, Estimated: >60 mL/min (ref >60)
Glucose, Bld: 123 mg/dL — ABNORMAL HIGH (ref 70–99)
Potassium: 3.6 mmol/L (ref 3.5–5.1)
Sodium: 132 mmol/L — ABNORMAL LOW (ref 135–145)

## 2021-06-23 LAB — MAGNESIUM: Magnesium: 1.8 mg/dL (ref 1.7–2.4)

## 2021-06-23 MED ORDER — BUTALBITAL-APAP-CAFFEINE 50-325-40 MG PO TABS: 2.0000 | ORAL_TABLET | Freq: Four times a day (QID) | ORAL | 0 refills | Status: DC | PRN

## 2021-06-23 MED ORDER — LOPERAMIDE HCL 2 MG PO CAPS
4.0000 mg | ORAL_CAPSULE | Freq: Once | ORAL | Status: AC
  Administered 2021-06-23: 4 mg via ORAL
  Filled 2021-06-23: qty 2

## 2021-06-23 NOTE — Progress Notes (Signed)
Report given to nurse Bussie at [facility] facility.

## 2021-06-23 NOTE — TOC Transition Note (Signed)
Transition of Care [facility]) - CM/SW Discharge Note   Patient Details  Name: [name] [name] MRN: [id number] Date of Birth: October 04, 1941  [facility] CM/SW Contact:  [name] [name] Phone Number: 06/23/2021, 8:45 AM   Clinical Narrative:    Patient will Discharge To: [facility] Anticipated DC Date:06/23/21 Family Notified:yes, [name] [name], [phone] Transport By: [name] [name]   Per MD patient ready for DC to [facility]. RN, patient, patient's family, and facility notified of DC. Assessment, Fl2/Pasrr, and Discharge Summary sent to facility. RN given number for report [phone], Rm # [phone]). DC packet on chart. Ambulance transport requested for patient at 12:00pm  CSW signing off.  [name] [name] LCSWA [phone]     Final next level of care: Skilled Nursing Facility Barriers to Discharge: No Barriers Identified   Patient Goals and CMS Choice Patient states their goals for this hospitalization and ongoing recovery are:: [place] living duplex CMS Medicare.gov Compare Post Acute Care list provided to:: Patient Choice offered to / list presented to : Patient  Discharge Placement              Patient chooses bed at: [facility] Patient to be transferred to facility by: [place] Name of family member notified: [name] [name] Patient and family notified of of transfer: 06/23/21  Discharge Plan and Services                                     Social Determinants of Health (SDOH) Interventions     Readmission Risk Interventions No flowsheet data found.

## 2021-06-23 NOTE — Plan of Care (Signed)
  Problem: Pain Managment: Goal: General experience of comfort will improve Outcome: Progressing   Problem: Safety: Goal: Ability to remain free from injury will improve Outcome: Progressing   

## 2021-06-23 NOTE — Discharge Summary (Signed)
Physician Discharge Summary   [name] [name] [id number] DOB: 1941/06/08 DOA: 06/17/2021  PCP: [name] Mc, MD  Admit date: 06/17/2021 Discharge date:  06/23/2021  Admitted From: home Disposition:  SNF Discharging physician: Dwyane Dee, MD  Recommendations for Outpatient Follow-up:  Repeat CBC in 2-3 days to ensure stability Follow up with ortho  Home Health:  Equipment/Devices:   Patient discharged to SNF in Discharge Condition: stable Risk of unplanned readmission score: Unplanned Admission- Pilot do not use: 14.28  CODE STATUS: Full Diet recommendation:  Diet Orders (From admission, onward)     Start     Ordered   06/19/21 1428  Diet gluten free Room service appropriate? Yes; Fluid consistency: Thin  Diet effective now       Question Answer Comment  Room service appropriate? Yes   Fluid consistency: Thin      06/19/21 [name] [name] Course: [name] is a 80 y.o. female with medical history significant for osteoporosis on Prolia twice a year, paroxysmal A. fib on Eliquis, mitral valve prolapse status post mitral valve replacement in 2017, sick sinus syndrome status post pacemaker placement, seizure disorder, essential hypertension, hyperlipidemia, who presented to [facility] ED after a fall at home.   Patient stated that prior to the fall she heard a loud pop and felt it on her left hip.  States that since March 2022 she has had trouble with her left hip after missing a step at her home.  She was going to physical therapy for it.  Her pain was not getting better but worse with therapy, she has been using a cane to ambulate.  Endorses history of osteoporosis for which she receives Prolia twice a year.  She feels that her left hip broke prior to her fall.  She denies any prior cardiopulmonary or GI symptoms.  She did not have her cell phone on her at the time of her fall, she had to crawl on her back to get to the phone to call EMS.  She was brought into  the ED for further evaluation.  Work-up in the ED revealed left displaced femoral fracture and dislocation at the fifth PIP joint without definite fracture.   She was evaluated by orthopedic surgery and underwent ORIF on 06/18/2021.  Finger dislocation was also reduced in the OR.  * Displaced comminuted fracture of shaft of left femur (HCC)-resolved as of 06/23/2021 - "Acute mildly comminuted and displaced fracture involving proximal shaft of left femur" - s/p ORIF with ortho on 06/18/21 - follow up PT eval: SNF recommended - continue pain control; fioricet as she only tolerates this - continue Eliquis; resumed 7/20  Hypotension-resolved as of 06/23/2021 - Likely from worsening anemia - improved with PRBC on 7/20  Acute blood loss anemia-resolved as of 06/23/2021 - Likely associated with recent surgery - s/p 1 unit PRBC on 7/20 for Hgb 7.4 g/dL -Lethargy and blood pressure improved - repeat CBC in 2-3 days  Hyperlipidemia - Continue statin  Seizure disorder ([place]) - continue Lamictal and Briviact - tramadol discontinued due to seizure history  Osteoporosis - On Prolia at home twice yearly - Follow-up vitamin D level: 47 (normal) - Continue fall precautions  PAF (paroxysmal atrial fibrillation) ([place]) - Eliquis resumed - Continue Toprol     Principal Diagnosis: Displaced comminuted fracture of shaft of left femur ([place])  Discharge Diagnoses: Active [name] Problems   Diagnosis Date Noted   PAF (paroxysmal atrial  fibrillation) ([place]) 06/19/2021   Osteoporosis 06/19/2021   Seizure disorder ([place]) 06/19/2021   Hyperlipidemia 06/19/2021    Resolved [name] Problems   Diagnosis Date Noted Date Resolved   Displaced comminuted fracture of shaft of left femur ([place]) 06/17/2021 06/23/2021    Priority: High   Acute blood loss anemia 06/20/2021 06/23/2021    Priority: Medium   Hypotension 06/20/2021 06/23/2021    Priority: Medium    Discharge Instructions     Discharge wound  care:   Complete by: As directed    Continue re-enforcing dressing   Increase activity slowly   Complete by: As directed       Allergies as of 06/23/2021       Reactions   Codeine Other (See Comments)   Migraine   Diphen [diphenhydramine Hcl] Other (See Comments)   seizure   Doxycycline Itching, Swelling   Hydrocodone Other (See Comments)   Migraine   Hydromorphone Other (See Comments)   Migraine   Mobic [meloxicam] Other (See Comments)   reflux   Nsaids Other (See Comments)   Pt on blood thinner   Oxycodone Other (See Comments)   Migraine   Propofol Other (See Comments)   Very sensitive; patient stated she was told she was told she had apnea   Tylenol [acetaminophen] Other (See Comments)   TYLENOL #3 - MIGRAINE        Medication List     TAKE these medications    Briviact 25 MG Tabs tablet Generic drug: brivaracetam Take 25-50 mg by mouth See admin instructions. '25mg'$  in am , '50mg'$  in pm   butalbital-acetaminophen-caffeine 50-325-40 MG tablet Commonly known as: FIORICET Take 2 tablets by mouth every 6 (six) hours as needed for headache.   Eliquis 5 MG Tabs tablet Generic drug: apixaban Take 5 mg by mouth 2 (two) times daily.   fluticasone 50 MCG/ACT nasal spray Commonly known as: FLONASE Place 2 sprays into both nostrils daily.   furosemide 40 MG tablet Commonly known as: LASIX Take 40 mg by mouth daily.   lamoTRIgine 150 MG tablet Commonly known as: LAMICTAL Take 150 mg by mouth 2 (two) times daily.   losartan 25 MG tablet Commonly known as: COZAAR Take 12.5 mg by mouth daily.   metoprolol succinate 50 MG 24 hr tablet Commonly known as: TOPROL-XL Take 25 mg by mouth daily.   montelukast 10 MG tablet Commonly known as: SINGULAIR Take 10 mg by mouth at bedtime.   rosuvastatin 40 MG tablet Commonly known as: CRESTOR Take 40 mg by mouth daily.   spironolactone 25 MG tablet Commonly known as: ALDACTONE Take 25 mg by mouth daily.   Ventolin  HFA 108 (90 Base) MCG/ACT inhaler Generic drug: albuterol Inhale 2 puffs into the lungs every 6 (six) hours as needed for wheezing or shortness of breath.               Discharge Care Instructions  (From admission, onward)           Start     Ordered   06/23/21 0000  Discharge wound care:       Comments: Continue re-enforcing dressing   06/23/21 0751            Contact information for after-discharge care     Destination     [facility] .   Service: Skilled Nursing Contact information: [place] [phone]  Allergies  Allergen Reactions   Codeine Other (See Comments)    Migraine   Diphen [Diphenhydramine Hcl] Other (See Comments)    seizure   Doxycycline Itching and Swelling   Hydrocodone Other (See Comments)    Migraine   Hydromorphone Other (See Comments)    Migraine   Mobic [Meloxicam] Other (See Comments)    reflux   Nsaids Other (See Comments)    Pt on blood thinner   Oxycodone Other (See Comments)    Migraine   Propofol Other (See Comments)    Very sensitive; patient stated she was told she was told she had apnea   Tylenol [Acetaminophen] Other (See Comments)    TYLENOL #3 - MIGRAINE    Consultations: Ortho  Discharge Exam: BP 99/60 (BP Location: Left Arm)   Pulse 74   Temp 98.2 F (36.8 C) (Oral)   Resp 17   Ht 5' 5.5" (1.664 m)   Wt 64.9 kg   SpO2 97%   BMI 23.43 kg/m  General appearance: alert, cooperative, and no distress Head: Normocephalic, without obvious abnormality, atraumatic Eyes:  EOMI Lungs: clear to auscultation bilaterally Heart: regular rate and rhythm and S1, S2 normal Abdomen: normal findings: bowel sounds normal and soft, non-tender Extremities:  Stable swelling noted in left thigh and the leg.  Compartments still remains soft and foot is warm/well-perfused Skin: mobility and turgor normal Neurologic: No focal  deficits.  Left lower extremity strength limited by pain from surgery  The results of significant diagnostics from this hospitalization (including imaging, microbiology, ancillary and laboratory) are listed below for reference.   Microbiology: Recent Results (from the past 240 hour(s))  Resp Panel by RT-PCR (Flu A&B, Covid) Nasopharyngeal Swab     Status: None   Collection Time: 06/17/21 10:12 PM   Specimen: Nasopharyngeal Swab; Nasopharyngeal(NP) swabs in vial transport medium  Result Value Ref Range Status   SARS Coronavirus 2 by RT PCR NEGATIVE NEGATIVE Final    Comment: (NOTE) SARS-CoV-2 target nucleic acids are NOT DETECTED.  The SARS-CoV-2 RNA is generally detectable in upper respiratory specimens during the acute phase of infection. The lowest concentration of SARS-CoV-2 viral copies this assay can detect is 138 copies/mL. A negative result does not preclude SARS-Cov-2 infection and should not be used as the sole basis for treatment or other patient management decisions. A negative result may occur with  improper specimen collection/handling, submission of specimen other than nasopharyngeal swab, presence of viral mutation(s) within the areas targeted by this assay, and inadequate number of viral copies(<138 copies/mL). A negative result must be combined with clinical observations, patient history, and epidemiological information. The expected result is Negative.  Fact Sheet for Patients:  EntrepreneurPulse.com.au  Fact Sheet for Healthcare Providers:  IncredibleEmployment.be  This test is no t yet approved or cleared by the [place] FDA and  has been authorized for detection and/or diagnosis of SARS-CoV-2 by FDA under an Emergency Use Authorization (EUA). This EUA will remain  in effect (meaning this test can be used) for the duration of the COVID-19 declaration under Section 564(b)(1) of the Act, 21 U.S.C.section 360bbb-3(b)(1),  unless the authorization is terminated  or revoked sooner.       Influenza A by PCR NEGATIVE NEGATIVE Final   Influenza B by PCR NEGATIVE NEGATIVE Final    Comment: (NOTE) The Xpert Xpress SARS-CoV-2/FLU/RSV plus assay is intended as an aid in the diagnosis of influenza from Nasopharyngeal swab specimens and should not be used as a sole basis for  treatment. Nasal washings and aspirates are unacceptable for Xpert Xpress SARS-CoV-2/FLU/RSV testing.  Fact Sheet for Patients: EntrepreneurPulse.com.au  Fact Sheet for Healthcare Providers: IncredibleEmployment.be  This test is not yet approved or cleared by the [place] FDA and has been authorized for detection and/or diagnosis of SARS-CoV-2 by FDA under an Emergency Use Authorization (EUA). This EUA will remain in effect (meaning this test can be used) for the duration of the COVID-19 declaration under Section 564(b)(1) of the Act, 21 U.S.C. section 360bbb-3(b)(1), unless the authorization is terminated or revoked.  Performed at [name], [place] [address] [place], [place] [address]   Surgical pcr screen     Status: None   Collection Time: 06/18/21  8:32 AM   Specimen: Nasal Mucosa; Nasal Swab  Result Value Ref Range Status   MRSA, PCR NEGATIVE NEGATIVE Final   Staphylococcus aureus NEGATIVE NEGATIVE Final    Comment: (NOTE) The Xpert SA Assay (FDA approved for NASAL specimens in patients 37 years of age and older), is one component of a comprehensive surveillance program. It is not intended to diagnose infection nor to guide or monitor treatment. Performed at [name], [place] [address] [place], [place] [address]   Resp Panel by RT-PCR (Flu A&B, Covid) Nasopharyngeal Swab     Status: None   Collection Time: 06/21/21 11:21 AM   Specimen: Nasopharyngeal Swab; Nasopharyngeal(NP) swabs in vial transport medium  Result Value Ref Range Status   SARS Coronavirus 2 by RT PCR  NEGATIVE NEGATIVE Final    Comment: (NOTE) SARS-CoV-2 target nucleic acids are NOT DETECTED.  The SARS-CoV-2 RNA is generally detectable in upper respiratory specimens during the acute phase of infection. The lowest concentration of SARS-CoV-2 viral copies this assay can detect is 138 copies/mL. A negative result does not preclude SARS-Cov-2 infection and should not be used as the sole basis for treatment or other patient management decisions. A negative result may occur with  improper specimen collection/handling, submission of specimen other than nasopharyngeal swab, presence of viral mutation(s) within the areas targeted by this assay, and inadequate number of viral copies(<138 copies/mL). A negative result must be combined with clinical observations, patient history, and epidemiological information. The expected result is Negative.  Fact Sheet for Patients:  EntrepreneurPulse.com.au  Fact Sheet for Healthcare Providers:  IncredibleEmployment.be  This test is no t yet approved or cleared by the [place] FDA and  has been authorized for detection and/or diagnosis of SARS-CoV-2 by FDA under an Emergency Use Authorization (EUA). This EUA will remain  in effect (meaning this test can be used) for the duration of the COVID-19 declaration under Section 564(b)(1) of the Act, 21 U.S.C.section 360bbb-3(b)(1), unless the authorization is terminated  or revoked sooner.       Influenza A by PCR NEGATIVE NEGATIVE Final   Influenza B by PCR NEGATIVE NEGATIVE Final    Comment: (NOTE) The Xpert Xpress SARS-CoV-2/FLU/RSV plus assay is intended as an aid in the diagnosis of influenza from Nasopharyngeal swab specimens and should not be used as a sole basis for treatment. Nasal washings and aspirates are unacceptable for Xpert Xpress SARS-CoV-2/FLU/RSV testing.  Fact Sheet for Patients: EntrepreneurPulse.com.au  Fact Sheet for  Healthcare Providers: IncredibleEmployment.be  This test is not yet approved or cleared by the [place] FDA and has been authorized for detection and/or diagnosis of SARS-CoV-2 by FDA under an Emergency Use Authorization (EUA). This EUA will remain in effect (meaning this test can be used) for the duration  of the COVID-19 declaration under Section 564(b)(1) of the Act, 21 U.S.C. section 360bbb-3(b)(1), unless the authorization is terminated or revoked.  Performed at [name], [place] [address] [place], [place] [address]      Labs: BNP (last 3 results) No results for input(s): BNP in the last 8760 hours. Basic Metabolic Panel: Recent Labs  Lab 06/18/21 0318 06/19/21 0135 06/20/21 0236 06/21/21 0256 06/22/21 0425 06/23/21 0333  NA 137 136 133* 135 134* 132*  K 4.2 4.5 4.0 3.7 3.4* 3.6  CL 103 106 105 104 105 101  CO2 '27 22 23 24 24 24  '$ GLUCOSE 140* 149* 122* 100* 102* 123*  BUN 27* '20 19 13 16 15  '$ CREATININE 0.92 0.91 0.85 0.82 0.74 0.85  CALCIUM 9.0 8.5* 7.8* 7.7* 7.8* 7.8*  MG 2.2  --  2.1 2.1 2.1 1.8  PHOS 3.1  --   --   --   --   --    Liver Function Tests: Recent Labs  Lab 06/17/21 2211  AST 24  ALT 14  ALKPHOS 68  BILITOT 0.4  PROT 6.2*  ALBUMIN 3.9   No results for input(s): LIPASE, AMYLASE in the last 168 hours. No results for input(s): AMMONIA in the last 168 hours. CBC: Recent Labs  Lab 06/19/21 0135 06/20/21 0236 06/21/21 0256 06/22/21 0425 06/23/21 0333  WBC 8.7 6.9 6.4 6.2 8.6  NEUTROABS  --   --  3.8 3.8 6.5  HGB 9.1* 7.4* 8.0* 8.0* 7.4*  HCT 27.1* 22.5* 23.7* 23.6* 21.8*  MCV 93.1 94.1 94.0 93.7 93.6  PLT 105* 80* 87* 101* 110*   Cardiac Enzymes: No results for input(s): CKTOTAL, CKMB, CKMBINDEX, TROPONINI in the last 168 hours. BNP: Invalid input(s): POCBNP CBG: No results for input(s): GLUCAP in the last 168 hours. D-Dimer No results for input(s): DDIMER in the last 72 hours. Hgb A1c No results  for input(s): HGBA1C in the last 72 hours. Lipid Profile No results for input(s): CHOL, HDL, LDLCALC, TRIG, CHOLHDL, LDLDIRECT in the last 72 hours. Thyroid function studies No results for input(s): TSH, T4TOTAL, T3FREE, THYROIDAB in the last 72 hours.  Invalid input(s): FREET3 Anemia work up No results for input(s): VITAMINB12, FOLATE, FERRITIN, TIBC, IRON, RETICCTPCT in the last 72 hours. Urinalysis No results found for: COLORURINE, APPEARANCEUR, [place], [place], GLUCOSEU, [place], [place], [place], PROTEINUR, UROBILINOGEN, NITRITE, LEUKOCYTESUR Sepsis Labs Invalid input(s): PROCALCITONIN,  WBC,  LACTICIDVEN Microbiology Recent Results (from the past 240 hour(s))  Resp Panel by RT-PCR (Flu A&B, Covid) Nasopharyngeal Swab     Status: None   Collection Time: 06/17/21 10:12 PM   Specimen: Nasopharyngeal Swab; Nasopharyngeal(NP) swabs in vial transport medium  Result Value Ref Range Status   SARS Coronavirus 2 by RT PCR NEGATIVE NEGATIVE Final    Comment: (NOTE) SARS-CoV-2 target nucleic acids are NOT DETECTED.  The SARS-CoV-2 RNA is generally detectable in upper respiratory specimens during the acute phase of infection. The lowest concentration of SARS-CoV-2 viral copies this assay can detect is 138 copies/mL. A negative result does not preclude SARS-Cov-2 infection and should not be used as the sole basis for treatment or other patient management decisions. A negative result may occur with  improper specimen collection/handling, submission of specimen other than nasopharyngeal swab, presence of viral mutation(s) within the areas targeted by this assay, and inadequate number of viral copies(<138 copies/mL). A negative result must be combined with clinical observations, patient history, and epidemiological information. The expected result is Negative.  Fact Sheet for  Patients:  EntrepreneurPulse.com.au  Fact Sheet for Healthcare Providers:   IncredibleEmployment.be  This test is no t yet approved or cleared by the [place] FDA and  has been authorized for detection and/or diagnosis of SARS-CoV-2 by FDA under an Emergency Use Authorization (EUA). This EUA will remain  in effect (meaning this test can be used) for the duration of the COVID-19 declaration under Section 564(b)(1) of the Act, 21 U.S.C.section 360bbb-3(b)(1), unless the authorization is terminated  or revoked sooner.       Influenza A by PCR NEGATIVE NEGATIVE Final   Influenza B by PCR NEGATIVE NEGATIVE Final    Comment: (NOTE) The Xpert Xpress SARS-CoV-2/FLU/RSV plus assay is intended as an aid in the diagnosis of influenza from Nasopharyngeal swab specimens and should not be used as a sole basis for treatment. Nasal washings and aspirates are unacceptable for Xpert Xpress SARS-CoV-2/FLU/RSV testing.  Fact Sheet for Patients: EntrepreneurPulse.com.au  Fact Sheet for Healthcare Providers: IncredibleEmployment.be  This test is not yet approved or cleared by the [place] FDA and has been authorized for detection and/or diagnosis of SARS-CoV-2 by FDA under an Emergency Use Authorization (EUA). This EUA will remain in effect (meaning this test can be used) for the duration of the COVID-19 declaration under Section 564(b)(1) of the Act, 21 U.S.C. section 360bbb-3(b)(1), unless the authorization is terminated or revoked.  Performed at [name], [place] [address], [place], [place] [address]   Surgical pcr screen     Status: None   Collection Time: 06/18/21  8:32 AM   Specimen: Nasal Mucosa; Nasal Swab  Result Value Ref Range Status   MRSA, PCR NEGATIVE NEGATIVE Final   Staphylococcus aureus NEGATIVE NEGATIVE Final    Comment: (NOTE) The Xpert SA Assay (FDA approved for NASAL specimens in patients 67 years of age and older), is one component of a comprehensive surveillance program.  It is not intended to diagnose infection nor to guide or monitor treatment. Performed at [name], [place] [address], [place], [place] [address]   Resp Panel by RT-PCR (Flu A&B, Covid) Nasopharyngeal Swab     Status: None   Collection Time: 06/21/21 11:21 AM   Specimen: Nasopharyngeal Swab; Nasopharyngeal(NP) swabs in vial transport medium  Result Value Ref Range Status   SARS Coronavirus 2 by RT PCR NEGATIVE NEGATIVE Final    Comment: (NOTE) SARS-CoV-2 target nucleic acids are NOT DETECTED.  The SARS-CoV-2 RNA is generally detectable in upper respiratory specimens during the acute phase of infection. The lowest concentration of SARS-CoV-2 viral copies this assay can detect is 138 copies/mL. A negative result does not preclude SARS-Cov-2 infection and should not be used as the sole basis for treatment or other patient management decisions. A negative result may occur with  improper specimen collection/handling, submission of specimen other than nasopharyngeal swab, presence of viral mutation(s) within the areas targeted by this assay, and inadequate number of viral copies(<138 copies/mL). A negative result must be combined with clinical observations, patient history, and epidemiological information. The expected result is Negative.  Fact Sheet for Patients:  EntrepreneurPulse.com.au  Fact Sheet for Healthcare Providers:  IncredibleEmployment.be  This test is no t yet approved or cleared by the [place] FDA and  has been authorized for detection and/or diagnosis of SARS-CoV-2 by FDA under an Emergency Use Authorization (EUA). This EUA will remain  in effect (meaning this test can be used) for the duration of the COVID-19 declaration under Section 564(b)(1) of the Act, 21  U.S.C.section 360bbb-3(b)(1), unless the authorization is terminated  or revoked sooner.       Influenza A by PCR NEGATIVE NEGATIVE Final   Influenza B by PCR  NEGATIVE NEGATIVE Final    Comment: (NOTE) The Xpert Xpress SARS-CoV-2/FLU/RSV plus assay is intended as an aid in the diagnosis of influenza from Nasopharyngeal swab specimens and should not be used as a sole basis for treatment. Nasal washings and aspirates are unacceptable for Xpert Xpress SARS-CoV-2/FLU/RSV testing.  Fact Sheet for Patients: EntrepreneurPulse.com.au  Fact Sheet for Healthcare Providers: IncredibleEmployment.be  This test is not yet approved or cleared by the [place] FDA and has been authorized for detection and/or diagnosis of SARS-CoV-2 by FDA under an Emergency Use Authorization (EUA). This EUA will remain in effect (meaning this test can be used) for the duration of the COVID-19 declaration under Section 564(b)(1) of the Act, 21 U.S.C. section 360bbb-3(b)(1), unless the authorization is terminated or revoked.  Performed at [name], [place] [address] [place], [place] [address]     Procedures/Studies: CT Head Wo Contrast  Result Date: 06/17/2021 CLINICAL DATA:  Fall, on Eliquis EXAM: CT HEAD WITHOUT CONTRAST TECHNIQUE: Contiguous axial images were obtained from the base of the skull through the vertex without intravenous contrast. COMPARISON:  CTA head dated 09/28/2018 FINDINGS: Brain: No evidence of acute infarction, hemorrhage, hydrocephalus, extra-axial collection or mass lesion/mass effect. Global cortical atrophy. Vascular: Intracranial atherosclerosis. Skull: Normal. Negative for fracture or focal lesion. Sinuses/Orbits: The visualized paranasal sinuses are essentially clear. The mastoid air cells are unopacified. Other: None. IMPRESSION: No evidence of acute intracranial abnormality. Mild cortical atrophy. Electronically Signed   By: Julian Hy M.D.   On: 06/17/2021 23:13   DG Chest Port 1 View  Result Date: 06/17/2021 CLINICAL DATA:  Fall femur fracture EXAM: PORTABLE CHEST 1 VIEW COMPARISON:   09/29/2018 FINDINGS: Left-sided pacing device and valve prosthesis as before. Atrial appendage clip. Probable scarring in the right mid lung. Stable enlarged cardiomediastinal silhouette. No acute consolidation, pleural effusion, or pneumothorax. IMPRESSION: Cardiomegaly with slight central congestion. Scarring in the right mid lung. Electronically Signed   By: Donavan Foil M.D.   On: 06/17/2021 22:44   DG Hand Complete Right  Result Date: 06/17/2021 CLINICAL DATA:  Trauma fall EXAM: RIGHT HAND - COMPLETE 3+ VIEW COMPARISON:  None. FINDINGS: Dorsal and ulnar dislocation base of fifth middle phalanx with respect to head of proximal phalanx. No definitive fracture seen. Advanced arthritis at the first [facility] joint and first MCP joint. Triangular fibrocartilage calcification. IMPRESSION: 1. Dislocation at the fifth PIP joint without definitive fracture Electronically Signed   By: Donavan Foil M.D.   On: 06/17/2021 22:40   DG Finger Little Right  Result Date: 06/20/2021 CLINICAL DATA:  Dislocation, finger closed, initial encounter. EXAM: RIGHT LITTLE FINGER 2+V COMPARISON:  Hand radiograph 06/18/2019 FINDINGS: The previous fifth digit dislocation at the proximal interphalangeal joint has been reduced. No fracture adjacent to the proximal interphalangeal joint. There is a tiny density about the dorsal aspect of the distal interphalangeal joint that is of unknown acuity. Fifth digit is taped to the fourth digit which limits detailed assessment. IMPRESSION: 1. Interval reduction of the previous fifth digit dislocation at the proximal interphalangeal joint. No fracture adjacent to the dislocation site. 2. Tiny density about the dorsal aspect of the distal interphalangeal joint may represent a avulsion injury, of unknown acuity. Electronically Signed   By: Keith Rake M.D.   On: 06/20/2021 16:23   DG  C-Arm 1-60 Min  Result Date: 06/18/2021 CLINICAL DATA:  Elective surgery. Additional history provided:  Intramedullary (I a.m.) nail femoral, open reduction internal fixation femur. Provided fluoroscopy time 1 minutes, 59 seconds (15.87 mGy). EXAM: LEFT FEMUR 2 VIEWS; DG C-ARM 1-60 MIN COMPARISON:  Radiographs of the left hip and left femur 06/17/2021. FINDINGS: Eight intraoperative fluoroscopic images of the left femur are submitted. On the provided images, there are findings of interval placement of an intramedullary nail within the left femur. There are 2 proximal interlocking screws which traverse the femoral head/neck. Two distal interlocking screws are also noted. The hardware traverses a femoral diaphyseal fracture. There is near anatomic alignment. Additionally, a lateral plate and multiple screws traverse the fracture site. IMPRESSION: Eight intraoperative fluoroscopic images of the left femur, as described. Electronically Signed   By: Kellie Simmering DO   On: 06/18/2021 17:22   DG HIP UNILAT WITH PELVIS 2-3 VIEWS LEFT  Result Date: 06/17/2021 CLINICAL DATA:  Fall EXAM: DG HIP (WITH OR WITHOUT PELVIS) 2-3V LEFT COMPARISON:  04/12/2021 FINDINGS: Pubic symphysis and rami are intact. Mild degenerative changes of both hips. Acute displaced and overriding fracture involving proximal shaft of left femur IMPRESSION: Acute displaced and overriding proximal femoral fracture Electronically Signed   By: Donavan Foil M.D.   On: 06/17/2021 23:06   DG Femur 1 View Left  Result Date: 06/17/2021 CLINICAL DATA:  Fall assess for fracture EXAM: LEFT FEMUR 1 VIEW COMPARISON:  None. FINDINGS: Acute mildly comminuted fracture involving the proximal shaft of the femur with greater than 1 shaft diameter posterior and 1/2 shaft diameter central displacement of distal fracture fragment. About 5 cm of overriding. Femoral head appears normally position. Vascular calcification IMPRESSION: Acute mildly comminuted and displaced fracture involving proximal shaft of left femur Electronically Signed   By: Donavan Foil M.D.   On:  06/17/2021 22:42   DG FEMUR MIN 2 VIEWS LEFT  Result Date: 06/18/2021 CLINICAL DATA:  Postop EXAM: LEFT FEMUR 2 VIEWS COMPARISON:  06/17/2021 FINDINGS: Interval intramedullary rod with proximal and distal screw fixation across proximal femoral shaft fracture. Additional small side plate and fixating screws at the site of fracture. Anatomic alignment. 12 mm bone fragment within the medial upper thigh soft tissues. Gas in the soft tissues consistent with recent surgery. IMPRESSION: Status post internal fixation of left femoral shaft fracture with expected postsurgical change Electronically Signed   By: Donavan Foil M.D.   On: 06/18/2021 19:56   DG FEMUR MIN 2 VIEWS LEFT  Result Date: 06/18/2021 CLINICAL DATA:  Elective surgery. Additional history provided: Intramedullary (I a.m.) nail femoral, open reduction internal fixation femur. Provided fluoroscopy time 1 minutes, 59 seconds (15.87 mGy). EXAM: LEFT FEMUR 2 VIEWS; DG C-ARM 1-60 MIN COMPARISON:  Radiographs of the left hip and left femur 06/17/2021. FINDINGS: Eight intraoperative fluoroscopic images of the left femur are submitted. On the provided images, there are findings of interval placement of an intramedullary nail within the left femur. There are 2 proximal interlocking screws which traverse the femoral head/neck. Two distal interlocking screws are also noted. The hardware traverses a femoral diaphyseal fracture. There is near anatomic alignment. Additionally, a lateral plate and multiple screws traverse the fracture site. IMPRESSION: Eight intraoperative fluoroscopic images of the left femur, as described. Electronically Signed   By: Kellie Simmering DO   On: 06/18/2021 17:22     Time coordinating discharge: Over 93 minutes    Dwyane Dee, MD  [facility] 06/23/2021, 7:53 AM

## 2021-06-25 ENCOUNTER — Encounter: Payer: Self-pay | Admitting: Cardiovascular Disease

## 2021-06-25 DIAGNOSIS — S7292XD Unspecified fracture of left femur, subsequent encounter for closed fracture with routine healing: Secondary | ICD-10-CM | POA: Diagnosis not present

## 2021-06-25 DIAGNOSIS — D649 Anemia, unspecified: Secondary | ICD-10-CM | POA: Diagnosis not present

## 2021-06-25 DIAGNOSIS — Z9181 History of falling: Secondary | ICD-10-CM | POA: Diagnosis not present

## 2021-06-25 DIAGNOSIS — I5030 Unspecified diastolic (congestive) heart failure: Secondary | ICD-10-CM | POA: Diagnosis not present

## 2021-06-29 ENCOUNTER — Telehealth: Payer: Self-pay | Admitting: Oncology

## 2021-06-29 NOTE — Telephone Encounter (Signed)
[facility] called to report a low hemoglobin lab value. Looks like

## 2021-07-02 DIAGNOSIS — S7292XD Unspecified fracture of left femur, subsequent encounter for closed fracture with routine healing: Secondary | ICD-10-CM | POA: Diagnosis not present

## 2021-07-02 DIAGNOSIS — I4819 Other persistent atrial fibrillation: Secondary | ICD-10-CM | POA: Diagnosis not present

## 2021-07-02 DIAGNOSIS — G4089 Other seizures: Secondary | ICD-10-CM | POA: Diagnosis not present

## 2021-07-02 DIAGNOSIS — I10 Essential (primary) hypertension: Secondary | ICD-10-CM | POA: Diagnosis not present

## 2021-07-02 DIAGNOSIS — D649 Anemia, unspecified: Secondary | ICD-10-CM

## 2021-07-03 ENCOUNTER — Encounter: Payer: Self-pay | Admitting: Oncology

## 2021-07-03 ENCOUNTER — Inpatient Hospital Stay: Payer: No Typology Code available for payment source | Attending: Oncology | Admitting: Oncology

## 2021-07-03 ENCOUNTER — Inpatient Hospital Stay: Payer: No Typology Code available for payment source

## 2021-07-03 VITALS — BP 101/59 | HR 76 | Temp 97.4°F | Resp 18 | Wt 148.9 lb

## 2021-07-03 DIAGNOSIS — R5383 Other fatigue: Secondary | ICD-10-CM | POA: Insufficient documentation

## 2021-07-03 DIAGNOSIS — Z79899 Other long term (current) drug therapy: Secondary | ICD-10-CM | POA: Diagnosis not present

## 2021-07-03 DIAGNOSIS — D696 Thrombocytopenia, unspecified: Secondary | ICD-10-CM | POA: Insufficient documentation

## 2021-07-03 DIAGNOSIS — M810 Age-related osteoporosis without current pathological fracture: Secondary | ICD-10-CM | POA: Diagnosis not present

## 2021-07-03 DIAGNOSIS — D649 Anemia, unspecified: Secondary | ICD-10-CM | POA: Insufficient documentation

## 2021-07-03 DIAGNOSIS — I5032 Chronic diastolic (congestive) heart failure: Secondary | ICD-10-CM | POA: Insufficient documentation

## 2021-07-03 DIAGNOSIS — D72820 Lymphocytosis (symptomatic): Secondary | ICD-10-CM | POA: Diagnosis not present

## 2021-07-03 LAB — TECHNOLOGIST SMEAR REVIEW
Plt Morphology: NORMAL
RBC MORPHOLOGY: NORMAL
WBC MORPHOLOGY: NORMAL

## 2021-07-03 LAB — VITAMIN B12: Vitamin B-12: 765 pg/mL (ref 180–914)

## 2021-07-03 LAB — RETIC PANEL
Immature Retic Fract: 13.2 % (ref 2.3–15.9)
RBC.: 3.1 MIL/uL — ABNORMAL LOW (ref 3.87–5.11)
Retic Count, Absolute: 123.4 10*3/uL (ref 19.0–186.0)
Retic Ct Pct: 4 % — ABNORMAL HIGH (ref 0.4–3.1)
Reticulocyte Hemoglobin: 34 pg (ref >27.9)

## 2021-07-03 LAB — CBC WITH DIFFERENTIAL/PLATELET
Abs Immature Granulocytes: 0.02 10*3/uL (ref 0.00–0.07)
Basophils Absolute: 0 10*3/uL (ref 0.0–0.1)
Basophils Relative: 0 %
Eosinophils Absolute: 0.1 10*3/uL (ref 0.0–0.5)
Eosinophils Relative: 2 %
HCT: 31.5 % — ABNORMAL LOW (ref 36.0–46.0)
Hemoglobin: 9.9 g/dL — ABNORMAL LOW (ref 12.0–15.0)
Immature Granulocytes: 0 %
Lymphocytes Relative: 20 %
Lymphs Abs: 1.4 10*3/uL (ref 0.7–4.0)
MCH: 31.8 pg (ref 26.0–34.0)
MCHC: 31.4 g/dL (ref 30.0–36.0)
MCV: 101.3 fL — ABNORMAL HIGH (ref 80.0–100.0)
Monocytes Absolute: 0.4 10*3/uL (ref 0.1–1.0)
Monocytes Relative: 6 %
Neutro Abs: 5.1 10*3/uL (ref 1.7–7.7)
Neutrophils Relative %: 72 %
Platelets: 206 10*3/uL (ref 150–400)
RBC: 3.11 MIL/uL — ABNORMAL LOW (ref 3.87–5.11)
RDW: 16.3 % — ABNORMAL HIGH (ref 11.5–15.5)
WBC: 7.1 10*3/uL (ref 4.0–10.5)
nRBC: 0 % (ref 0.0–0.2)

## 2021-07-03 LAB — FOLATE: Folate: 22 ng/mL (ref >5.9)

## 2021-07-03 LAB — HEPATIC FUNCTION PANEL
ALT: 17 U/L (ref 0–44)
AST: 25 U/L (ref 15–41)
Albumin: 4.4 g/dL (ref 3.5–5.0)
Alkaline Phosphatase: 95 U/L (ref 38–126)
Bilirubin, Direct: 0.2 mg/dL (ref 0.0–0.2)
Indirect Bilirubin: 0.9 mg/dL (ref 0.3–0.9)
Total Bilirubin: 1.1 mg/dL (ref 0.3–1.2)
Total Protein: 6.9 g/dL (ref 6.5–8.1)

## 2021-07-03 LAB — LACTATE DEHYDROGENASE: LDH: 400 U/L — ABNORMAL HIGH (ref 98–192)

## 2021-07-03 LAB — IRON AND TIBC
Iron: 69 ug/dL (ref 28–170)
Saturation Ratios: 22 % (ref 10.4–31.8)
TIBC: 316 ug/dL (ref 250–450)
UIBC: 247 ug/dL

## 2021-07-03 LAB — TSH: TSH: 2.991 u[IU]/mL (ref 0.350–4.500)

## 2021-07-03 LAB — FERRITIN: Ferritin: 218 ng/mL (ref 11–307)

## 2021-07-03 LAB — IMMATURE PLATELET FRACTION: Immature Platelet Fraction: 4 % (ref 1.2–8.6)

## 2021-07-03 NOTE — Progress Notes (Signed)
Pt seen Dr Grayland Ormond in the past. (2015). Was recently hospitalized and had surgery on broken right femur and hemoglobin counts were low at 7.4 while in hospital and was still low last week according to pt, prompting new referral.

## 2021-07-03 NOTE — Progress Notes (Signed)
Hematology/Oncology Consult note [facility] [phone] [phone]   Patient Care Team: [name], MD as PCP - General (Internal Medicine) Evans Lance, MD as PCP - Electrophysiology (Cardiology) Minna Merritts, MD as PCP - Cardiology (Cardiology) Bary Castilla, Forest Gleason, MD (General Surgery) [name], MD (Internal Medicine) [name], MD (Internal Medicine)  REFERRING PROVIDER: [name], MD  CHIEF COMPLAINTS/REASON FOR VISIT:  Evaluation of anemia and thrombocytopenia.   HISTORY OF PRESENTING ILLNESS:   [name] [name] is a  80 y.o.  female with PMH listed below was seen in consultation at the request of  [name], MD  for evaluation of anemia and thrombocytopenia.  06/17/2021- 06/23/2021 patient was hospitalized due to left displaced femoral fracture and dislocation of at the fifth PIP. She was evaluated by orthopedic surgery and underwent ORIF on 06/18/2021.  Finger dislocation was also reduced in the OR. Patient has chronic anemia with hemoglobin around 11 before her admission.  Hemoglobin dropped to 7.4 dueing her hospitalization s/p PRBC transfusion. Hemoglobin improved 8 on 06/22/2021. At discharge, 06/23/2021 cbc showed hemoglobin 7.4, platelet count 110000  Patient reports feeling fatigue.   Review of Systems  Constitutional:  Positive for fatigue. Negative for appetite change, chills and fever.  HENT:   Negative for hearing loss and voice change.   Eyes:  Negative for eye problems.  Respiratory:  Negative for chest tightness, cough and shortness of breath.   Cardiovascular:  Negative for chest pain.  Gastrointestinal:  Negative for abdominal distention, abdominal pain and blood in stool.  Endocrine: Negative for hot flashes.  Genitourinary:  Negative for difficulty urinating and frequency.   Musculoskeletal:  Negative for arthralgias.       Recent femoral fracture.   Skin:  Negative for itching  and rash.  Neurological:  Negative for extremity weakness.  Hematological:  Negative for adenopathy.  Psychiatric/Behavioral:  Negative for confusion.    MEDICAL HISTORY:  Past Medical History:  Diagnosis Date   Acute on chronic diastolic (congestive) heart failure ([place])    Allergy    See list   Anemia 2018   Anxiety    Occasionally take Xanax for sleep   Anxiety associated with depression    Prn alprazolam    Anxiety associated with depression    Arthritis    Hands, Back   Atrial fibrillation, persistent (HCC)    DCCV 08/22/2015   Bradycardia post-op bradycardia, pacer dependent   MDT PPM 11/06/15, Dr. Lovena Le   Cataract    Left eye   Diverticulosis    Focal seizures ([place])    Heart murmur    Hemorrhoid    Hepatic cyst    innumerable   History of asbestos exposure    Hyperlipidemia    IBS (irritable bowel syndrome)    Idiopathic thrombocytopenic purpura (ITP) (HCC)    Migraine    MVP (mitral valve prolapse)    Nodule of right lung    Osteoporosis of forearm    RBBB    Restrictive lung disease    Mild on PFT & likely cardiac in etiology    S/P Minimally invasive maze operation for atrial fibrillation 10/31/2015   Complete bilateral atrial lesion set using cryothermy and bipolar radiofrequency ablation with clipping of LA appendage via right mini thoracotomy approach   S/P minimally invasive mitral valve replacement with bioprosthetic valve 10/31/2015   33 mm [facility] Mitral bovine bioprosthetic tissue valve placed via right mini thoracotomy approach  Severe mitral regurgitation    Skin cancer, 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   SVT (supraventricular tachycardia) (HCC)    Thoracic aorta atherosclerosis (HCC)    TIA (transient ischemic attack)     SURGICAL HISTORY: Past Surgical History:  Procedure Laterality Date   BREAST EXCISIONAL BIOPSY Right Late 80s   Negative X2   CARDIAC CATHETERIZATION N/A 10/18/2015   Procedure: Right/Left Heart Cath and  Coronary Angiography;  Surgeon: Sherren Mocha, MD;  Location: [place] CV LAB;  Service: Cardiovascular;  Laterality: N/A;   CARDIOVERSION N/A 08/22/2015   Procedure: CARDIOVERSION;  Surgeon: Thayer Headings, MD;  Location: [facility] ENDOSCOPY;  Service: Cardiovascular;  Laterality: N/A;   COLONOSCOPY  2003   EP IMPLANTABLE DEVICE N/A 11/06/2015   Procedure: Pacemaker Implant;  Surgeon: Evans Lance, MD;  Location: [place] CV LAB;  Service: Cardiovascular;  Laterality: N/A;   EP IMPLANTABLE DEVICE N/A 02/20/2016   Procedure: Lead Extraction;  Surgeon: Evans Lance, MD;  Location: [place] CV LAB;  Service: Cardiovascular;  Laterality: N/A;   EYE SURGERY Right 2013   FEMUR IM NAIL Left 06/18/2021   Procedure: INTRAMEDULLARY (IM) NAIL FEMORAL, OPEN REDUCTION INTERNAL FIXATION FEMUR RIGHT LITTLE FINGER PIP CLOSED REDUCTION;  Surgeon: Altamese Bessemer, MD;  Location: [place];  Service: Orthopedics;  Laterality: Left;   FOOT SURGERY     MANDIBLE FRACTURE SURGERY  03-26-13   MINIMALLY INVASIVE MAZE PROCEDURE N/A 10/31/2015   Procedure: MINIMALLY INVASIVE MAZE PROCEDURE;  Surgeon: Rexene Alberts, MD;  Location: [place];  Service: Open Heart Surgery;  Laterality: N/A;   MITRAL VALVE REPLACEMENT Right 10/31/2015   Procedure: MINIMALLY INVASIVE MITRAL VALVE (MV) REPLACEMENT;  Surgeon: Rexene Alberts, MD;  Location: [place];  Service: Open Heart Surgery;  Laterality: Right;   PACEMAKER LEAD REMOVAL  02/20/2016   TEE WITH CARDIOVERSION     TEE WITHOUT CARDIOVERSION N/A 08/22/2015   Procedure: TRANSESOPHAGEAL ECHOCARDIOGRAM (TEE);  Surgeon: Thayer Headings, MD;  Location: [place];  Service: Cardiovascular;  Laterality: N/A;   TEE WITHOUT CARDIOVERSION N/A 10/31/2015   Procedure: TRANSESOPHAGEAL ECHOCARDIOGRAM (TEE);  Surgeon: Rexene Alberts, MD;  Location: [place];  Service: Open Heart Surgery;  Laterality: N/A;   TUBAL LIGATION     VARICOSE VEIN SURGERY Right     SOCIAL HISTORY: Social History    Socioeconomic History   Marital status: Widowed    Spouse name: Deceased   Number of children: 2   Years of education: 16   Highest education level: Not on file  Occupational History   Occupation: Retired  Tobacco Use   Smoking status: Never   Smokeless tobacco: Never  Vaping Use   Vaping Use: Never used  Substance and Sexual Activity   Alcohol use: No    Alcohol/week: 3.0 standard drinks    Types: 3 Standard drinks or equivalent per week   Drug use: No   Sexual activity: Not Currently  Other Topics Concern   Not on file  Social History Narrative   ** Merged History Encounter **       She is a widow.  Husband died from metastatic renal cell cancer. Originally from [place]. Previously lived in [place] from 1975-[place]. Moved to [place] in 2007. No mold exposure recently but did have it through a prior work exposure in 1993. Has a masters in    public health. No bird exposure.  [facility] (09/29/17): She has moved into a retirement community since last  appointment. She reports she had testing at her new residence that was positive for mold. It has since been treated.    Social Determinants of Health   Financial Resource Strain: Not on file  Food Insecurity: Not on file  Transportation Needs: Not on file  Physical Activity: Not on file  Stress: Not on file  Social Connections: Not on file  Intimate Partner Violence: Not on file    FAMILY HISTORY: Family History  Problem Relation Age of Onset   Hypertension Mother    Arrhythmia Mother    Heart failure Mother    Arrhythmia Brother    Stroke Brother 75       cerebral hemorrhage, nonsmoker, no HTN   Prostate cancer Brother    Stroke Father        from an aneurysm   Stroke Maternal Aunt 83       cerebral hemorrhage   Liver cancer Maternal Grandmother    Atrial fibrillation Son    Celiac disease Son    Heart attack Neg Hx    Breast cancer Neg Hx     ALLERGIES:  is allergic to acetaminophen-codeine, codeine,  epinephrine, hydrocodone, hydromorphone, oxycodone, meloxicam, codeine, dextromethorphan, diphen [diphenhydramine hcl], diphenhydramine, diphenhydramine hcl, diphenylpyraline, doxycycline, doxycycline, hydrocodone, hydromorphone, meloxicam, mobic [meloxicam], nsaids, nsaids, oxycodone, propofol, propofol, and tylenol [acetaminophen].  MEDICATIONS:  Current Outpatient Medications  Medication Sig Dispense Refill   azelastine (ASTELIN) 0.1 % nasal spray TAKE 2 SPRAYS INTO BOTH NOSTRILS TWICE DAILY AS DIRECTED 30 mL 12   BRIVIACT 25 MG TABS tablet Take one tablet (25MG) by mouth in the AM and two tablets (50MG) by mouth in the PM.     BRIVIACT 25 MG TABS tablet Take 25-50 mg by mouth See admin instructions. 20m in am , 525min pm     butalbital-acetaminophen-caffeine (FIORICET) 50-325-40 MG tablet TAKE ONE TABLET BY MOUTH DAILY AS NEEDED FOR BACK PAIN OR MIGRAINE HEADACHE. MAX OF 3 TABLETS DAILY 90 tablet 3   Cholecalciferol (VITAMIN D) 2000 units CAPS Take 2,000 Units by mouth daily.     ELIQUIS 5 MG TABS tablet TAKE ONE TABLET TWICE DAILY 180 tablet 1   fluticasone (FLONASE) 50 MCG/ACT nasal spray USE 2 SPRAYS IN BOTH NOSTRILS DAILY 16 g 11   Folic Acid 0.8 MG CAPS Take 0.8 mg by mouth daily.     furosemide (LASIX) 40 MG tablet Take one tablet twice per week. Take an additional 0.5-1 tablet as needed for fluid retention. 30 tablet 11   hydroquinone 4 % cream Apply topically in the morning and at bedtime.      lamoTRIgine (LAMICTAL) 150 MG tablet Take 150 mg by mouth 2 (two) times daily.     losartan (COZAAR) 25 MG tablet Take 0.5 tablets (12.5 mg total) by mouth daily. 45 tablet 3   metoprolol succinate (TOPROL XL) 25 MG 24 hr tablet Take 1 tablet (25 mg total) by mouth daily. 90 tablet 2   montelukast (SINGULAIR) 10 MG tablet TAKE ONE TABLET BY MOUTH AT BEDTIME 90 tablet 1   polyethylene glycol powder (GLYCOLAX/MIRALAX) 17 GM/SCOOP powder Take 1 Dose by mouth daily.     rosuvastatin (CRESTOR) 40  MG tablet TAKE ONE TABLET EVERY DAY 90 tablet 1   spironolactone (ALDACTONE) 25 MG tablet Take 0.5 tablets (12.5 mg total) by mouth every other day.     triamcinolone cream (KENALOG) 0.1 % Apply 1 application topically 2 (two) times daily. 30 g 0   VENTOLIN HFA 108 (  90 Base) MCG/ACT inhaler INHALE 2 PUFFS INTO THE LUNGS EVERY 6 HOURS AS NEEDED FOR WHEEZING OR SHORTNESS OF BREATH 18 g 1   vitamin B-12 (CYANOCOBALAMIN) 1000 MCG tablet Take 1,000 mcg by mouth daily.     amoxicillin (AMOXIL) 500 MG tablet Take 4 tablets 60 minutes prior to dental procedures (Patient not taking: No sig reported) 30 tablet 6   No current facility-administered medications for this visit.     PHYSICAL EXAMINATION: ECOG PERFORMANCE STATUS: 1 - Symptomatic but completely ambulatory Vitals:   07/03/21 1550  BP: (!) 101/59  Pulse: 76  Resp: 18  Temp: (!) 97.4 F (36.3 C)  SpO2: 100%   Filed Weights   07/03/21 1550  Weight: 148 lb 14.4 oz (67.5 kg)    Physical Exam Constitutional:      General: She is not in acute distress.    Comments: frail  HENT:     Head: Normocephalic and atraumatic.  Eyes:     General: No scleral icterus. Cardiovascular:     Rate and Rhythm: Normal rate and regular rhythm.     Heart sounds: Normal heart sounds.  Pulmonary:     Effort: Pulmonary effort is normal. No respiratory distress.     Breath sounds: No wheezing.  Abdominal:     General: Bowel sounds are normal. There is no distension.     Palpations: Abdomen is soft.  Musculoskeletal:        General: No deformity. Normal range of motion.     Cervical back: Normal range of motion and neck supple.  Skin:    General: Skin is warm and dry.     Findings: No erythema or rash.  Neurological:     Mental Status: She is alert and oriented to person, place, and time. Mental status is at baseline.     Cranial Nerves: No cranial nerve deficit.     Coordination: Coordination normal.  Psychiatric:        Mood and Affect: Mood  normal.    LABORATORY DATA:  I have reviewed the data as listed Lab Results  Component Value Date   WBC 7.1 07/03/2021   HGB 9.9 (L) 07/03/2021   HCT 31.5 (L) 07/03/2021   MCV 101.3 (H) 07/03/2021   PLT 206 07/03/2021   Recent Labs    01/25/21 1417 02/08/21 1446 06/17/21 2211 06/17/21 2220 06/21/21 0256 06/22/21 0425 06/23/21 0333 07/03/21 1618  NA 141   < > 135   < > 135 134* 132*  --   K 4.0   < > 4.2   < > 3.7 3.4* 3.6  --   CL 100   < > 102   < > 104 105 101  --   CO2 20   < > 27   < > '24 24 24  ' --   GLUCOSE 94   < > 117*   < > 100* 102* 123*  --   BUN 26   < > 29*   < > '13 16 15  ' --   CREATININE 0.98   < > 1.02*   < > 0.82 0.74 0.85  --   CALCIUM 9.3   < > 9.1   < > 7.7* 7.8* 7.8*  --   GFRNONAA 55*   < > 56*   < > >60 >60 >60  --   GFRAA 63  --   --   --   --   --   --   --  PROT  --   --  6.2*  --   --   --   --  6.9  ALBUMIN  --   --  3.9  --   --   --   --  4.4  AST  --   --  24  --   --   --   --  25  ALT  --   --  14  --   --   --   --  17  ALKPHOS  --   --  68  --   --   --   --  95  BILITOT  --   --  0.4  --   --   --   --  1.1  BILIDIR  --   --   --   --   --   --   --  0.2  IBILI  --   --   --   --   --   --   --  0.9   < > = values in this interval not displayed.   Iron/TIBC/Ferritin/ %Sat    Component Value Date/Time   IRON 69 07/03/2021 1618   IRON 74 04/08/2013 1150   TIBC 316 07/03/2021 1618   TIBC 296 04/08/2013 1150   FERRITIN 218 07/03/2021 1618   FERRITIN 65 04/08/2013 1150   IRONPCTSAT 22 07/03/2021 1618   IRONPCTSAT 26 08/23/2019 0817      RADIOGRAPHIC STUDIES: I have personally reviewed the radiological images as listed and agreed with the findings in the report. CT Head Wo Contrast  Result Date: 06/17/2021 CLINICAL DATA:  Fall, on Eliquis EXAM: CT HEAD WITHOUT CONTRAST TECHNIQUE: Contiguous axial images were obtained from the base of the skull through the vertex without intravenous contrast. COMPARISON:  CTA head dated  09/28/2018 FINDINGS: Brain: No evidence of acute infarction, hemorrhage, hydrocephalus, extra-axial collection or mass lesion/mass effect. Global cortical atrophy. Vascular: Intracranial atherosclerosis. Skull: Normal. Negative for fracture or focal lesion. Sinuses/Orbits: The visualized paranasal sinuses are essentially clear. The mastoid air cells are unopacified. Other: None. IMPRESSION: No evidence of acute intracranial abnormality. Mild cortical atrophy. Electronically Signed   By: Julian Hy M.D.   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 Atrial appendage clip. Probable scarring in the right mid lung. Stable enlarged cardiomediastinal silhouette. No acute consolidation, pleural effusion, or pneumothorax. IMPRESSION: Cardiomegaly with slight central congestion. Scarring in the right mid lung. Electronically Signed   By: Donavan Foil M.D.   On: 06/17/2021 22:44   DG Hand Complete Right  Result Date: 06/17/2021 CLINICAL DATA:  Trauma fall EXAM: RIGHT HAND - COMPLETE 3+ VIEW COMPARISON:  None. FINDINGS: Dorsal and ulnar dislocation base of fifth middle phalanx with respect to head of proximal phalanx. No definitive fracture seen. Advanced arthritis at the first [facility] joint and first MCP joint. Triangular fibrocartilage calcification. IMPRESSION: 1. Dislocation at the fifth PIP joint without definitive fracture Electronically Signed   By: Donavan Foil M.D.   On: 06/17/2021 22:40   DG Finger Little Right  Result Date: 06/20/2021 CLINICAL DATA:  Dislocation, finger closed, initial encounter. EXAM: RIGHT LITTLE FINGER 2+V COMPARISON:  Hand radiograph 06/18/2019 FINDINGS: The previous fifth digit dislocation at the proximal interphalangeal joint has been reduced. No fracture adjacent to the proximal interphalangeal  joint. There is a tiny density about the dorsal aspect of the distal interphalangeal joint that is of unknown acuity. Fifth digit is taped to the fourth digit which limits detailed assessment. IMPRESSION: 1. Interval reduction of the previous fifth digit dislocation at the proximal interphalangeal joint. No fracture adjacent to the dislocation site. 2. Tiny density about the dorsal aspect of the distal interphalangeal joint may represent a avulsion injury, of unknown acuity. Electronically Signed   By: Keith Rake M.D.   On: 06/20/2021 16:23   DG C-Arm 1-60 Min  Result Date: 06/18/2021 CLINICAL DATA:  Elective surgery. Additional history provided: Intramedullary (I a.m.) nail femoral, open reduction internal fixation femur. Provided fluoroscopy time 1 minutes, 59 seconds (15.87 mGy). EXAM: LEFT FEMUR 2 VIEWS; DG C-ARM 1-60 MIN COMPARISON:  Radiographs of the left hip and left femur 06/17/2021. FINDINGS: Eight intraoperative fluoroscopic images of the left femur are submitted. On the provided images, there are findings of interval placement of an intramedullary nail within the left femur. There are 2 proximal interlocking screws which traverse the femoral head/neck. Two distal interlocking screws are also noted. The hardware traverses a femoral diaphyseal fracture. There is near anatomic alignment. Additionally, a lateral plate and multiple screws traverse the fracture site. IMPRESSION: Eight intraoperative fluoroscopic images of the left femur, as described. Electronically Signed   By: Kellie Simmering DO   On: 06/18/2021 17:22   DG HIP UNILAT WITH PELVIS 2-3 VIEWS LEFT  Result Date: 06/17/2021 CLINICAL DATA:  Fall EXAM: DG HIP (WITH OR WITHOUT PELVIS) 2-3V LEFT COMPARISON:  04/12/2021 FINDINGS: Pubic symphysis and rami are intact. Mild degenerative changes of both hips. Acute displaced and overriding fracture involving proximal shaft of left femur IMPRESSION: Acute displaced and overriding proximal femoral  fracture Electronically Signed   By: Donavan Foil M.D.   On: 06/17/2021 23:06   DG Femur 1 View Left  Result Date: 06/17/2021 CLINICAL DATA:  Fall assess for fracture EXAM: LEFT FEMUR 1 VIEW COMPARISON:  None. FINDINGS: Acute mildly comminuted fracture involving the proximal shaft of the femur with greater than 1 shaft diameter posterior and 1/2 shaft diameter central displacement of distal fracture fragment. About 5 cm of overriding. Femoral head appears normally position. Vascular calcification IMPRESSION: Acute mildly comminuted and displaced fracture involving proximal shaft of left femur Electronically Signed   By: Donavan Foil M.D.   On: 06/17/2021 22:42   DG FEMUR MIN 2 VIEWS LEFT  Result Date: 06/18/2021 CLINICAL DATA:  Postop EXAM: LEFT FEMUR 2 VIEWS COMPARISON:  06/17/2021 FINDINGS: Interval intramedullary rod with proximal and distal screw fixation across proximal femoral shaft fracture. Additional small side plate and fixating screws at the site of fracture. Anatomic alignment. 12 mm bone fragment within the medial upper thigh soft tissues. Gas in the soft tissues consistent with recent surgery. IMPRESSION: Status post internal fixation of left femoral shaft fracture with expected postsurgical change Electronically Signed   By: Donavan Foil M.D.   On: 06/18/2021 19:56   DG FEMUR MIN 2 VIEWS LEFT  Result Date: 06/18/2021 CLINICAL DATA:  Elective surgery. Additional history provided: Intramedullary (I a.m.) nail femoral, open reduction internal fixation femur. Provided fluoroscopy time 1 minutes, 59 seconds (15.87 mGy). EXAM: LEFT FEMUR 2 VIEWS; DG C-ARM 1-60 MIN COMPARISON:  Radiographs of the left hip and left femur 06/17/2021. FINDINGS: Eight intraoperative fluoroscopic images of the left femur are submitted. On the provided images, there are findings of interval placement of an intramedullary nail within the left femur.  There are 2 proximal interlocking screws which traverse the femoral  head/neck. Two distal interlocking screws are also noted. The hardware traverses a femoral diaphyseal fracture. There is near anatomic alignment. Additionally, a lateral plate and multiple screws traverse the fracture site. IMPRESSION: Eight intraoperative fluoroscopic images of the left femur, as described. Electronically Signed   By: Kellie Simmering DO   On: 06/18/2021 17:22      ASSESSMENT & PLAN:  1. Thrombocytopenia ([place])   2. Normocytic anemia    #Acute on chronic anemia.  Check cbc, smear, TSH, SPEP, retic panel, iron tibc ferritin, folate and Vitamin B12, LDH,   # Thrombocytopenia, likely due to consumption due to surgery.  Check immature platelet fraction.   Orders Placed This Encounter  Procedures   Ferritin    Standing Status:   Future    Number of Occurrences:   1    Standing Expiration Date:   01/03/2022   Iron and TIBC    Standing Status:   Future    Number of Occurrences:   1    Standing Expiration Date:   07/03/2022   Folate    Standing Status:   Future    Number of Occurrences:   1    Standing Expiration Date:   07/03/2022   Vitamin B12    Standing Status:   Future    Number of Occurrences:   1    Standing Expiration Date:   07/03/2022   CBC with Differential/Platelet    Standing Status:   Future    Number of Occurrences:   1    Standing Expiration Date:   07/03/2022   Retic Panel    Standing Status:   Future    Number of Occurrences:   1    Standing Expiration Date:   07/03/2022   Multiple Myeloma Panel (SPEP&IFE w/QIG)    Standing Status:   Future    Number of Occurrences:   1    Standing Expiration Date:   07/03/2022   Kappa/lambda light chains    Standing Status:   Future    Number of Occurrences:   1    Standing Expiration Date:   07/03/2022   Technologist smear review    Standing Status:   Future    Number of Occurrences:   1    Standing Expiration Date:   07/03/2022   Lactate dehydrogenase    Standing Status:   Future    Number of Occurrences:   1    Standing  Expiration Date:   07/03/2022   Immature Platelet Fraction    Standing Status:   Future    Number of Occurrences:   1    Standing Expiration Date:   07/03/2022   Hepatic function panel    Standing Status:   Future    Number of Occurrences:   1    Standing Expiration Date:   07/03/2022   TSH    Standing Status:   Future    Number of Occurrences:   1    Standing Expiration Date:   07/03/2022    All questions were answered. The patient knows to call the clinic with any problems questions or concerns.   [name], MD    Return of visit: 2-3 weeks.  Thank you for this kind referral and the opportunity to participate in the care of this patient. A copy of today's note is routed to referring provider    Earlie Server, MD, PhD Hematology Oncology [facility]  [place] [facility] [place]- [phone] 07/03/2021

## 2021-07-04 LAB — KAPPA/LAMBDA LIGHT CHAINS
Kappa free light chain: 15.3 mg/L (ref 3.3–19.4)
Kappa, lambda light chain ratio: 0.99 (ref 0.26–1.65)
Lambda free light chains: 15.4 mg/L (ref 5.7–26.3)

## 2021-07-05 DIAGNOSIS — I5032 Chronic diastolic (congestive) heart failure: Secondary | ICD-10-CM | POA: Diagnosis not present

## 2021-07-05 DIAGNOSIS — Z79899 Other long term (current) drug therapy: Secondary | ICD-10-CM | POA: Diagnosis not present

## 2021-07-05 DIAGNOSIS — M810 Age-related osteoporosis without current pathological fracture: Secondary | ICD-10-CM | POA: Diagnosis not present

## 2021-07-05 DIAGNOSIS — D649 Anemia, unspecified: Secondary | ICD-10-CM | POA: Diagnosis present

## 2021-07-05 DIAGNOSIS — R5383 Other fatigue: Secondary | ICD-10-CM | POA: Diagnosis not present

## 2021-07-05 DIAGNOSIS — D696 Thrombocytopenia, unspecified: Secondary | ICD-10-CM | POA: Diagnosis not present

## 2021-07-05 DIAGNOSIS — D72820 Lymphocytosis (symptomatic): Secondary | ICD-10-CM | POA: Diagnosis not present

## 2021-07-05 LAB — MULTIPLE MYELOMA PANEL, SERUM
Albumin SerPl Elph-Mcnc: 4.1 g/dL (ref 2.9–4.4)
Albumin/Glob SerPl: 1.8 — ABNORMAL HIGH (ref 0.7–1.7)
Alpha 1: 0.4 g/dL (ref 0.0–0.4)
Alpha2 Glob SerPl Elph-Mcnc: 0.5 g/dL (ref 0.4–1.0)
B-Globulin SerPl Elph-Mcnc: 0.9 g/dL (ref 0.7–1.3)
Gamma Glob SerPl Elph-Mcnc: 0.6 g/dL (ref 0.4–1.8)
Globulin, Total: 2.4 g/dL (ref 2.2–3.9)
IgA: 95 mg/dL (ref 64–422)
IgG (Immunoglobin G), Serum: 701 mg/dL (ref 586–1602)
IgM (Immunoglobulin M), Srm: 36 mg/dL (ref 26–217)
Total Protein ELP: 6.5 g/dL (ref 6.0–8.5)

## 2021-07-09 ENCOUNTER — Encounter: Payer: Self-pay | Admitting: Oncology

## 2021-07-11 ENCOUNTER — Encounter: Payer: Medicare Other | Admitting: Internal Medicine

## 2021-07-24 ENCOUNTER — Inpatient Hospital Stay: Payer: No Typology Code available for payment source

## 2021-07-24 ENCOUNTER — Other Ambulatory Visit: Payer: Self-pay

## 2021-07-24 ENCOUNTER — Encounter: Payer: Self-pay | Admitting: Oncology

## 2021-07-24 ENCOUNTER — Inpatient Hospital Stay (HOSPITAL_BASED_OUTPATIENT_CLINIC_OR_DEPARTMENT_OTHER): Payer: No Typology Code available for payment source | Admitting: Oncology

## 2021-07-24 VITALS — BP 100/60 | HR 86 | Temp 98.7°F | Resp 20 | Wt 145.4 lb

## 2021-07-24 DIAGNOSIS — D696 Thrombocytopenia, unspecified: Secondary | ICD-10-CM

## 2021-07-24 DIAGNOSIS — D72820 Lymphocytosis (symptomatic): Secondary | ICD-10-CM | POA: Diagnosis not present

## 2021-07-24 DIAGNOSIS — R7402 Elevation of levels of lactic acid dehydrogenase (LDH): Secondary | ICD-10-CM | POA: Diagnosis not present

## 2021-07-24 DIAGNOSIS — D649 Anemia, unspecified: Secondary | ICD-10-CM | POA: Diagnosis not present

## 2021-07-24 LAB — CBC WITH DIFFERENTIAL/PLATELET
Abs Immature Granulocytes: 0.03 10*3/uL (ref 0.00–0.07)
Basophils Absolute: 0.1 10*3/uL (ref 0.0–0.1)
Basophils Relative: 1 %
Eosinophils Absolute: 0.2 10*3/uL (ref 0.0–0.5)
Eosinophils Relative: 3 %
HCT: 34 % — ABNORMAL LOW (ref 36.0–46.0)
Hemoglobin: 10.8 g/dL — ABNORMAL LOW (ref 12.0–15.0)
Immature Granulocytes: 1 %
Lymphocytes Relative: 23 %
Lymphs Abs: 1.1 10*3/uL (ref 0.7–4.0)
MCH: 31.5 pg (ref 26.0–34.0)
MCHC: 31.8 g/dL (ref 30.0–36.0)
MCV: 99.1 fL (ref 80.0–100.0)
Monocytes Absolute: 0.4 10*3/uL (ref 0.1–1.0)
Monocytes Relative: 9 %
Neutro Abs: 2.9 10*3/uL (ref 1.7–7.7)
Neutrophils Relative %: 63 %
Platelets: 155 10*3/uL (ref 150–400)
RBC: 3.43 MIL/uL — ABNORMAL LOW (ref 3.87–5.11)
RDW: 14.1 % (ref 11.5–15.5)
WBC: 4.7 10*3/uL (ref 4.0–10.5)
nRBC: 0 % (ref 0.0–0.2)

## 2021-07-24 LAB — RETIC PANEL
Immature Retic Fract: 5.2 % (ref 2.3–15.9)
RBC.: 3.42 MIL/uL — ABNORMAL LOW (ref 3.87–5.11)
Retic Count, Absolute: 39.7 10*3/uL (ref 19.0–186.0)
Retic Ct Pct: 1.2 % (ref 0.4–3.1)
Reticulocyte Hemoglobin: 33.6 pg (ref >27.9)

## 2021-07-24 LAB — C-REACTIVE PROTEIN: CRP: 0.9 mg/dL (ref <1.0)

## 2021-07-24 LAB — SEDIMENTATION RATE: Sed Rate: 13 mm/hr (ref 0–30)

## 2021-07-24 NOTE — Progress Notes (Signed)
Hematology/Oncology Consult note [name] [name] [phone] [phone]   Patient Care Team: [name], MD as PCP - General (Internal Medicine) Evans Lance, MD as PCP - Electrophysiology (Cardiology) Minna Merritts, MD as PCP - Cardiology (Cardiology) Bary Castilla, Forest Gleason, MD (General Surgery) [name], MD (Internal Medicine) [name], MD (Internal Medicine)  REFERRING PROVIDER: [name], MD  CHIEF COMPLAINTS/REASON FOR VISIT:  Follow up for anemia and thrombocytopenia.   HISTORY OF PRESENTING ILLNESS:   [name] [name] is a  80 y.o.  female with PMH listed below was seen in consultation at the request of  [name], MD  for evaluation of anemia and thrombocytopenia.  06/17/2021- 06/23/2021 patient was hospitalized due to left displaced femoral fracture and dislocation of at the fifth PIP. She was evaluated by orthopedic surgery and underwent ORIF on 06/18/2021.  Finger dislocation was also reduced in the OR. Patient has chronic anemia with hemoglobin around 11 before her admission.  Hemoglobin dropped to 7.4 dueing her hospitalization s/p PRBC transfusion. Hemoglobin improved 8 on 06/22/2021. At discharge, 06/23/2021 cbc showed hemoglobin 7.4, platelet count 110000  Patient reports feeling fatigue.   INTERVAL HISTORY [name] [name] is a 80 y.o. female who has above history reviewed by me today presents for follow up visit for anemia  problems and complaints are listed below: Patient has been discharged from SNF and now lives at home Patient reports feeling much better and stronger.  She has home physical therapy.  No new complaints.  Chronic left thumb deformity is secondary to trauma/fall  Review of Systems  Constitutional:  Positive for fatigue. Negative for appetite change, chills and fever.  HENT:   Negative for hearing loss and voice change.   Eyes:  Negative for eye problems.  Respiratory:   Negative for chest tightness, cough and shortness of breath.   Cardiovascular:  Negative for chest pain.  Gastrointestinal:  Negative for abdominal distention, abdominal pain and blood in stool.  Endocrine: Negative for hot flashes.  Genitourinary:  Negative for difficulty urinating and frequency.   Musculoskeletal:  Negative for arthralgias.       Recent femoral fracture.   Skin:  Negative for itching and rash.  Neurological:  Negative for extremity weakness.  Hematological:  Negative for adenopathy.  Psychiatric/Behavioral:  Negative for confusion.    MEDICAL HISTORY:  Past Medical History:  Diagnosis Date   Acute on chronic diastolic (congestive) heart failure ([place])    Allergy    See list   Anemia 2018   Anxiety    Occasionally take Xanax for sleep   Anxiety associated with depression    Prn alprazolam    Anxiety associated with depression    Arthritis    Hands, Back   Atrial fibrillation, persistent (HCC)    DCCV 08/22/2015   Bradycardia post-op bradycardia, pacer dependent   MDT PPM 11/06/15, Dr. Lovena Le   Cataract    Left eye   Diverticulosis    Focal seizures ([place])    Heart murmur    Hemorrhoid    Hepatic cyst    innumerable   History of asbestos exposure    Hyperlipidemia    IBS (irritable bowel syndrome)    Idiopathic thrombocytopenic purpura (ITP) (HCC)    Migraine    MVP (mitral valve prolapse)    Nodule of right lung    Osteoporosis of forearm    RBBB    Restrictive lung disease    Mild  on PFT & likely cardiac in etiology    S/P Minimally invasive maze operation for atrial fibrillation 10/31/2015   Complete bilateral atrial lesion set using cryothermy and bipolar radiofrequency ablation with clipping of LA appendage via right mini thoracotomy approach   S/P minimally invasive mitral valve replacement with bioprosthetic valve 10/31/2015   33 mm [facility] Mitral bovine bioprosthetic tissue valve placed via right mini thoracotomy approach   Severe mitral  regurgitation    Skin cancer, 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   SVT (supraventricular tachycardia) ([place])    Thoracic aorta atherosclerosis ([place])    TIA (transient ischemic attack)     SURGICAL HISTORY: Past Surgical History:  Procedure Laterality Date   BREAST EXCISIONAL BIOPSY Right Late 80s   Negative X2   CARDIAC CATHETERIZATION N/A 10/18/2015   Procedure: Right/Left Heart Cath and Coronary Angiography;  Surgeon: Sherren Mocha, MD;  Location: [place] [facility];  Service: Cardiovascular;  Laterality: N/A;   CARDIOVERSION N/A 08/22/2015   Procedure: CARDIOVERSION;  Surgeon: Thayer Headings, MD;  Location[name];  Service: Cardiovascular;  Laterality: N/A;   COLONOSCOPY  2003   EP IMPLANTABLE DEVICE N/A 11/06/2015   Procedure: Pacemaker Implant;  Surgeon: Evans Lance, MD;  Location: [place] [facility];  Service: Cardiovascular;  Laterality: N/A;   EP IMPLANTABLE DEVICE N/A 02/20/2016   Procedure: Lead Extraction;  Surgeon: Evans Lance, MD;  Location: [place] [facility];  Service: Cardiovascular;  Laterality: N/A;   EYE SURGERY Right 2013   FEMUR IM NAIL Left 06/18/2021   Procedure: INTRAMEDULLARY (IM) NAIL FEMORAL, OPEN REDUCTION INTERNAL FIXATION FEMUR RIGHT LITTLE FINGER PIP CLOSED REDUCTION;  Surgeon: Altamese , MD;  Location: [place];  Service: Orthopedics;  Laterality: Left;   FOOT SURGERY     MANDIBLE FRACTURE SURGERY  03-26-13   MINIMALLY INVASIVE MAZE PROCEDURE N/A 10/31/2015   Procedure: MINIMALLY INVASIVE MAZE PROCEDURE;  Surgeon: Rexene Alberts, MD;  Location: [place];  Service: Open Heart Surgery;  Laterality: N/A;   MITRAL VALVE REPLACEMENT Right 10/31/2015   Procedure: MINIMALLY INVASIVE MITRAL VALVE (MV) REPLACEMENT;  Surgeon: Rexene Alberts, MD;  Location: [place];  Service: Open Heart Surgery;  Laterality: Right;   PACEMAKER LEAD REMOVAL  02/20/2016   TEE WITH CARDIOVERSION     TEE WITHOUT CARDIOVERSION N/A 08/22/2015   Procedure: TRANSESOPHAGEAL  ECHOCARDIOGRAM (TEE);  Surgeon: Thayer Headings, MD;  Location: [place];  Service: Cardiovascular;  Laterality: N/A;   TEE WITHOUT CARDIOVERSION N/A 10/31/2015   Procedure: TRANSESOPHAGEAL ECHOCARDIOGRAM (TEE);  Surgeon: Rexene Alberts, MD;  Location: [place];  Service: Open Heart Surgery;  Laterality: N/A;   TUBAL LIGATION     VARICOSE VEIN SURGERY Right     SOCIAL HISTORY: Social History   Socioeconomic History   Marital status: Widowed    Spouse name: Deceased   Number of children: 2   Years of education: 16   Highest education level: Not on file  Occupational History   Occupation: Retired  Tobacco Use   Smoking status: Never   Smokeless tobacco: Never  Vaping Use   Vaping Use: Never used  Substance and Sexual Activity   Alcohol use: No    Alcohol/week: 3.0 standard drinks    Types: 3 Standard drinks or equivalent per week   Drug use: No   Sexual activity: Not Currently  Other Topics Concern   Not on file  Social History Narrative   ** Merged History Encounter **  She is a widow.  Husband died from metastatic renal cell cancer. Originally from [place]. Previously lived in [place] from 1975-[place]. Moved to [place] in 2007. No mold exposure recently but did have it through a prior work exposure in 1993. Has a masters in    public health. No bird exposure.  [facility] (09/29/17): She has moved into a retirement community since last appointment. She reports she had testing at her new residence that was positive for mold. It has since been treated.    Social Determinants of Health   Financial Resource Strain: Not on file  Food Insecurity: Not on file  Transportation Needs: Not on file  Physical Activity: Not on file  Stress: Not on file  Social Connections: Not on file  Intimate Partner Violence: Not on file    FAMILY HISTORY: Family History  Problem Relation Age of Onset   Hypertension Mother    Arrhythmia Mother    Heart failure Mother    Arrhythmia  Brother    Stroke Brother 34       cerebral hemorrhage, nonsmoker, no HTN   Prostate cancer Brother    Stroke Father        from an aneurysm   Stroke Maternal Aunt 83       cerebral hemorrhage   Liver cancer Maternal Grandmother    Atrial fibrillation Son    Celiac disease Son    Heart attack Neg Hx    Breast cancer Neg Hx     ALLERGIES:  is allergic to acetaminophen-codeine, codeine, epinephrine, hydrocodone, hydromorphone, oxycodone, meloxicam, codeine, dextromethorphan, diphen [diphenhydramine hcl], diphenhydramine, diphenhydramine hcl, diphenylpyraline, doxycycline, doxycycline, hydrocodone, hydromorphone, meloxicam, mobic [meloxicam], nsaids, nsaids, oxycodone, propofol, propofol, and tylenol [acetaminophen].  MEDICATIONS:  Current Outpatient Medications  Medication Sig Dispense Refill   azelastine (ASTELIN) 0.1 % nasal spray TAKE 2 SPRAYS INTO BOTH NOSTRILS TWICE DAILY AS DIRECTED 30 mL 12   BRIVIACT 25 MG TABS tablet Take one tablet (25MG) by mouth in the AM and two tablets (50MG) by mouth in the PM.     BRIVIACT 25 MG TABS tablet Take 25-50 mg by mouth See admin instructions. 20m in am , 529min pm     butalbital-acetaminophen-caffeine (FIORICET) 50-325-40 MG tablet TAKE ONE TABLET BY MOUTH DAILY AS NEEDED FOR BACK PAIN OR MIGRAINE HEADACHE. MAX OF 3 TABLETS DAILY 90 tablet 3   Cholecalciferol (VITAMIN D) 2000 units CAPS Take 2,000 Units by mouth daily.     ELIQUIS 5 MG TABS tablet TAKE ONE TABLET TWICE DAILY 180 tablet 1   fluticasone (FLONASE) 50 MCG/ACT nasal spray USE 2 SPRAYS IN BOTH NOSTRILS DAILY 16 g 11   Folic Acid 0.8 MG CAPS Take 0.8 mg by mouth daily.     furosemide (LASIX) 40 MG tablet Take one tablet twice per week. Take an additional 0.5-1 tablet as needed for fluid retention. 30 tablet 11   hydroquinone 4 % cream Apply topically in the morning and at bedtime.      lamoTRIgine (LAMICTAL) 150 MG tablet Take 200 mg by mouth 2 (two) times daily.     losartan  (COZAAR) 25 MG tablet Take 0.5 tablets (12.5 mg total) by mouth daily. 45 tablet 3   metoprolol succinate (TOPROL XL) 25 MG 24 hr tablet Take 1 tablet (25 mg total) by mouth daily. 90 tablet 2   montelukast (SINGULAIR) 10 MG tablet TAKE ONE TABLET BY MOUTH AT BEDTIME 90 tablet 1   polyethylene glycol powder (GLYCOLAX/MIRALAX) 17 GM/SCOOP powder  Take 1 Dose by mouth daily.     rosuvastatin (CRESTOR) 40 MG tablet TAKE ONE TABLET EVERY DAY 90 tablet 1   spironolactone (ALDACTONE) 25 MG tablet Take 0.5 tablets (12.5 mg total) by mouth every other day.     triamcinolone cream (KENALOG) 0.1 % Apply 1 application topically 2 (two) times daily. 30 g 0   VENTOLIN HFA 108 (90 Base) MCG/ACT inhaler INHALE 2 PUFFS INTO THE LUNGS EVERY 6 HOURS AS NEEDED FOR WHEEZING OR SHORTNESS OF BREATH 18 g 1   vitamin B-12 (CYANOCOBALAMIN) 1000 MCG tablet Take 1,000 mcg by mouth daily.     amoxicillin (AMOXIL) 500 MG tablet Take 4 tablets 60 minutes prior to dental procedures (Patient not taking: No sig reported) 30 tablet 6   No current facility-administered medications for this visit.     PHYSICAL EXAMINATION: ECOG PERFORMANCE STATUS: 1 - Symptomatic but completely ambulatory Vitals:   07/24/21 1311  BP: 100/60  Pulse: 86  Resp: 20  Temp: 98.7 F (37.1 C)  SpO2: 100%   Filed Weights   07/24/21 1311  Weight: 145 lb 6.4 oz (66 kg)    Physical Exam Constitutional:      General: She is not in acute distress.    Comments: Frail, patient walks with a walker  HENT:     Head: Normocephalic and atraumatic.  Eyes:     General: No scleral icterus. Cardiovascular:     Rate and Rhythm: Normal rate and regular rhythm.     Heart sounds: Normal heart sounds.  Pulmonary:     Effort: Pulmonary effort is normal. No respiratory distress.     Breath sounds: No wheezing.  Abdominal:     General: Bowel sounds are normal. There is no distension.     Palpations: Abdomen is soft.  Musculoskeletal:        General:  Deformity present. Normal range of motion.     Cervical back: Normal range of motion and neck supple.     Comments: Left thumb  Skin:    General: Skin is warm and dry.     Findings: No erythema or rash.  Neurological:     Mental Status: She is alert and oriented to person, place, and time. Mental status is at baseline.     Cranial Nerves: No cranial nerve deficit.     Coordination: Coordination normal.  Psychiatric:        Mood and Affect: Mood normal.    LABORATORY DATA:  I have reviewed the data as listed Lab Results  Component Value Date   WBC 4.7 07/24/2021   HGB 10.8 (L) 07/24/2021   HCT 34.0 (L) 07/24/2021   MCV 99.1 07/24/2021   PLT 155 07/24/2021   Recent Labs    01/25/21 1417 02/08/21 1446 06/17/21 2211 06/17/21 2220 06/21/21 0256 06/22/21 0425 06/23/21 0333 07/03/21 1618  NA 141   < > 135   < > 135 134* 132*  --   K 4.0   < > 4.2   < > 3.7 3.4* 3.6  --   CL 100   < > 102   < > 104 105 101  --   CO2 20   < > 27   < > '24 24 24  ' --   GLUCOSE 94   < > 117*   < > 100* 102* 123*  --   BUN 26   < > 29*   < > '13 16 15  ' --   CREATININE 0.98   < >  1.02*   < > 0.82 0.74 0.85  --   CALCIUM 9.3   < > 9.1   < > 7.7* 7.8* 7.8*  --   GFRNONAA 55*   < > 56*   < > >60 >60 >60  --   GFRAA 63  --   --   --   --   --   --   --   PROT  --   --  6.2*  --   --   --   --  6.9  ALBUMIN  --   --  3.9  --   --   --   --  4.4  AST  --   --  24  --   --   --   --  25  ALT  --   --  14  --   --   --   --  17  ALKPHOS  --   --  68  --   --   --   --  95  BILITOT  --   --  0.4  --   --   --   --  1.1  BILIDIR  --   --   --   --   --   --   --  0.2  IBILI  --   --   --   --   --   --   --  0.9   < > = values in this interval not displayed.    Iron/TIBC/Ferritin/ %Sat    Component Value Date/Time   IRON 69 07/03/2021 1618   IRON 74 04/08/2013 1150   TIBC 316 07/03/2021 1618   TIBC 296 04/08/2013 1150   FERRITIN 218 07/03/2021 1618   FERRITIN 65 04/08/2013 1150   IRONPCTSAT 22  07/03/2021 1618   IRONPCTSAT 26 08/23/2019 0817      RADIOGRAPHIC STUDIES: I have personally reviewed the radiological images as listed and agreed with the findings in the report. No results found.    ASSESSMENT & PLAN:  1. Thrombocytopenia ([place])   2. Normocytic anemia   3. 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  Labs reviewed and discussed with patient.  CBC shows hemoglobin has improved to 9.9. She has normal iron panel, B12 and folate, no M protein on SPEP Acute drop of her hemoglobin is likely due to her recent surgery. Repeat cbc  # Elevated LDH, chronic? She had elevated LDH 8 years ago.  Check flowcytometry. ANA, ESR, CRP  # Thrombocytopenia, likely due to consumption due to surgery.  Resolved.  # flowcytometry showed monoclonal lymphocytosis. Observation.   Orders Placed This Encounter  Procedures   CBC with Differential/Platelet    Standing Status:   Future    Number of Occurrences:   1    Standing Expiration Date:   07/24/2022   Retic Panel    Standing Status:   Future    Number of Occurrences:   1    Standing Expiration Date:   07/24/2022   Flow cytometry panel-leukemia/lymphoma work-up    Standing Status:   Future    Number of Occurrences:   1    Standing Expiration Date:   07/24/2022   Sedimentation rate    Standing Status:   Future    Number of Occurrences:   1    Standing Expiration Date:   07/24/2022  ANA w/Reflex    Standing Status:   Future    Number of Occurrences:   1    Standing Expiration Date:   07/24/2022   C-reactive protein    Standing Status:   Future    Number of Occurrences:   1    Standing Expiration Date:   07/24/2022    All questions were answered. The patient knows to call the clinic with any problems questions or concerns.   [name], MD    Return of visit: TBD Thank you for this kind referral and the opportunity to  participate in the care of this patient. A copy of today's note is routed to referring provider    Earlie Server, MD, PhD Hematology Oncology [facility] Pager- [phone] 07/24/2021

## 2021-07-25 ENCOUNTER — Other Ambulatory Visit: Payer: Self-pay | Admitting: Internal Medicine

## 2021-07-25 LAB — ANA W/REFLEX: Anti Nuclear Antibody (ANA): NEGATIVE

## 2021-07-26 LAB — COMP PANEL: LEUKEMIA/LYMPHOMA: Immunophenotypic Profile: 8

## 2021-07-30 ENCOUNTER — Telehealth: Payer: Self-pay

## 2021-07-30 DIAGNOSIS — D696 Thrombocytopenia, unspecified: Secondary | ICD-10-CM

## 2021-07-30 NOTE — Telephone Encounter (Signed)
Please schedule patient for lab/MD in 6 months and notify pt of appts.

## 2021-07-30 NOTE — Telephone Encounter (Signed)
-----   Message from Earlie Server, MD sent at 07/28/2021  2:06 PM EDT ----- My chart message sent.   Follow up 6 months, labs, please order cbc cmp ldh. See MD Thanks.

## 2021-08-01 ENCOUNTER — Telehealth: Payer: Self-pay

## 2021-08-01 DIAGNOSIS — R7402 Elevation of levels of lactic acid dehydrogenase (LDH): Secondary | ICD-10-CM | POA: Insufficient documentation

## 2021-08-01 DIAGNOSIS — D72820 Lymphocytosis (symptomatic): Secondary | ICD-10-CM | POA: Insufficient documentation

## 2021-08-01 NOTE — Telephone Encounter (Signed)
-----   Message from Earlie Server, MD sent at 08/01/2021  8:32 AM EDT ----- Her flowcytometry came back positive for monoclonal lymphocytosis.- no need for treatment.  Current follow up in 6 months, please move lab md up to 2 months, I will discuss w her in details.

## 2021-08-01 NOTE — Telephone Encounter (Signed)
Patient informed via [place].   Please cancel appts in Feb and move up to 2 months from now. Please inform pt of appts.

## 2021-08-02 NOTE — Telephone Encounter (Signed)
08/02/2021 Left VM informing pt that appts have been moved up to 10/03/21 @ 9:30 per MD. Hanley Seamen call back number in case there are any questions or scheduling concerns  SRW

## 2021-08-04 NOTE — Op Note (Addendum)
5:04 PM  PATIENT:  [name] [name]  80 y.o. female  PRE-OPERATIVE DIAGNOSIS:  RIGHT PROXIMAL FEMORAL SHAFT FRACTURE, BISPHOSPHONATE ASSOCIATED RIGHT SMALL FINGER PIP DISLOCATION  POST-OPERATIVE DIAGNOSIS:  RIGHT PROXIMAL FEMORAL SHAFT FRACTURE, BISPHOSPHONATE ASSOCIATED RIGHT SMALL FINGER PIP DISLOCATION  PROCEDURE:   ANTEGRADE INTRAMEDULLARY NAILING OF THE RIGHT FEMUR with 9 X 400, statically locked RECON PLATING OF RIGHT FEMUR WITH SYNTHES SMALL FRAGMENT CLOSED REDUCTION OF RIGHT SMALL FINGER PROXIMAL INTERPHALANGEAL JOINT DISLOCATION  SURGEON:  Surgeon(s) and Role:    Altamese , MD - Primary  PHYSICIAN ASSISTANT: 1. Ainsley Spinner, PA-C; 2. PA student  ANESTHESIA:   general  EBL:  50 mL   BLOOD ADMINISTERED:none  DRAINS: none   LOCAL MEDICATIONS USED:  NONE  SPECIMEN:  No Specimen  DISPOSITION OF SPECIMEN:  N/A  COUNTS:  YES  DICTATION: .Note written in EPIC  PLAN OF CARE: Admit to inpatient   PATIENT DISPOSITION:  PACU - hemodynamically stable.   Delay start of Pharmacological VTE agent (>24hrs) due to surgical blood loss or risk of bleeding: no  BRIEF SUMMARY AND INDICATION OF PROCEDURE:  [name] [name] is a 80 y.o. year- old with multiple medical problems and long term use of bisphosphonates with spontaneous fracture through stress reactive bone.  I discussed with the patient risks and benefits of surgical treatment including the potential for malunion, nonunion, symptomatic hardware, heart attack, stroke, neurovascular injury, bleeding, and others.  After acknowledging these risks, consent was provided to proceed.  BRIEF SUMMARY OF PROCEDURE:  The patient was taken to the operating room where general anesthesia was induced, and then was positioned supine on the radiolucent table with a bump under the operative hip. I immediately performed a closed reduction of the right small finger PIP joint and was able to take it through a range of motion without  excessive instability.  Traction was used to maintain reduction while standard prep with chlorhexidene wash and then betadine scrub and paint was performed. After sterile drapes and time-out, a long instrument was used to identify the appropriate starting position under C-arm on both AP and lateral images.  A 3 cm incision was made proximal to the greater trochanter.  The curved cannulated awl was inserted medial to the trochanter and then the starting guidewire advanced into the proximal femur.  This was checked on AP and lateral views.  The starting reamer was engaged while protecting the soft tissue with a sleeve.   My assistant applied attraction and derotated the fracture to dial in the reduction, but it could not be adequately produced and maintained. Use of a ball spike pusher through a small anterior incision also failed to produce reduction. Consequently a 7 cm lateral incision was made and the Synthes small frag plate used to interdigitate and compress the fracture fragments, establishing a near anatomic reduction with slight valgus by intention as opposed to the considerable varus produced by the stress reacted bone. The curved ball-tipped guidewire was then inserted, making sure it safely crossed the fracture site on AP and LAT views and then stayed safely posterior in the distal femur. We sequentially reamed up to 10.5 mm, making sure the fracture remained reduced during reaming, and inserted a 9 x 400 mm nail to the appropriate depth.  The guidewires for the lag screws was then inserted in appropriate anteversion to make sure it was in a center-center position within the femoral head.  This was measured and the lag screws placed sequentially with excellent  purchase and position as checked on both views.  I turned attention distally where two static locking bolts were placed using perfect circle technique.  Final images were obtained showing excellent reduction, with appropriate hardware placement,  trajectory and length.  Ainsley Spinner, PA-C assisted throughout the case with establishment of reduction, reaming while I held reduction, as well as some instrumentation, as well as wound closure. We irrigated and closed the wound in standard layered fashion using running 0 and figure-of-eight 0 Vicryl for the vastus lateralis, interrupted figure-of- eight #1 Vicryl for the tensor and then 2-0 Vicryl and 2-0 nylon for the skin.  Sterile gently compressive dressing was applied. Ulnar gutter splint in intrinsic plus position applied to right arm. The patient was awakened from anesthesia and transported to the PACU in stable condition.   PROGNOSIS:  [name] [name] should do well with high rate of healing in spite of her prolong bisphosphonate use, given the alignment and construct we were able to obtain. Immediate range of motion of the knee, hip and ankle are encouraged and patient may progress to weightbearing as tolerated. Formal pharmacologic DVT prophylaxis has been ordered. A two day stay prior to discharge would be anticipated depending on progression. Convert to buddy straps for right finger at that time. Office follow up in 2 weeks for removal of sutures and new x-rays.     Astrid Divine. Marcelino Scot, M.D.

## 2021-08-14 ENCOUNTER — Other Ambulatory Visit: Payer: Self-pay

## 2021-08-14 ENCOUNTER — Encounter: Payer: Self-pay | Admitting: Internal Medicine

## 2021-08-14 ENCOUNTER — Ambulatory Visit (INDEPENDENT_AMBULATORY_CARE_PROVIDER_SITE_OTHER): Payer: Medicare Other | Admitting: Internal Medicine

## 2021-08-14 DIAGNOSIS — M8000XS Age-related osteoporosis with current pathological fracture, unspecified site, sequela: Secondary | ICD-10-CM | POA: Diagnosis not present

## 2021-08-14 DIAGNOSIS — Z711 Person with feared health complaint in whom no diagnosis is made: Secondary | ICD-10-CM

## 2021-08-14 DIAGNOSIS — Z66 Do not resuscitate: Secondary | ICD-10-CM | POA: Diagnosis not present

## 2021-08-14 DIAGNOSIS — I6523 Occlusion and stenosis of bilateral carotid arteries: Secondary | ICD-10-CM | POA: Diagnosis not present

## 2021-08-14 DIAGNOSIS — I700 Atherosclerosis of aorta: Secondary | ICD-10-CM | POA: Diagnosis not present

## 2021-08-14 NOTE — Assessment & Plan Note (Signed)
Now with DNR and MOST forms

## 2021-08-14 NOTE — Patient Instructions (Addendum)
I will make a referral to an osteoporosis specialist at [facility]  to see if there are options or alternatives to Prolia (like [place])

## 2021-08-14 NOTE — Assessment & Plan Note (Signed)
With recent displaced communiated fracture of left femur which occurred pathologically while standing in her kitchen.  Attributed to history of proonged alendrinate yse from 2000 to 2007. Has been taking prolia since 2015.  Referral to osteoporosis specialist discussed

## 2021-08-14 NOTE — Assessment & Plan Note (Signed)
Reviewed findings of prior CT scan today..  Patient is tolerating high potency statin therapy  

## 2021-08-14 NOTE — Progress Notes (Signed)
Subjective:  Patient ID: [name] [name], female    DOB: 1941/01/18  Age: 80 y.o. MRN: [id number]  CC: Diagnoses of Thoracic aorta atherosclerosis ([place]), Concern about end of life, Do not resuscitate status, and Age-related osteoporosis with current pathological fracture, sequela were pertinent to this visit.  HPI [name] [name] presents for FOLLOW UP ON MULTIPLE ISSUES   Chief Complaint  Patient presents with   Follow-up     LEFT FEMUR FRACTURE SURGERY ON July 17 BY Dr Marcelino Scot in  [place].  The fracture was attributed to alendronate therapy per patient (stated to her by [facility]) . Required extensive surgery, now advised to use a walker by Ortho , not a cane) . The fracture occurred after a fall in March during a walking tour when she had to rush going down some stairs.  Stepped too hard on the last stop but did not fall.  Hurt for 6 weeks,  then reinjured it in April. Marland Kitchen  Hip was x rayed in May with no fractures seen ,  was referred to [facility] and sent fot 6 weeks of PT .  Pain became so severe she was seen again by Ortho in  July  and given a steroid shot in the hip and and five days later while at home heard a "crack" and collapsed to the floor due to weakness (not pain)   Was taken to ER and a  comminuted fracture was seen in the proximal shaft.    Required traction to reduce the fracture prior to surgery on July 18. Rehabbed at [facility] for ten days or PT and OT  Did not like the experience (was denied miralax request for days , was coerced to use a cane instead of a walker ).eft rehab early due to dissatisfaction. Received home PT/OT . Felt that they pushed her too hard and she spend most of the rest of the day recovering from the PT, and at follow up with Ortho 2 pins had broken   Last DEXA Oct 2021: osteopenia in femus (t scores improving) except in forearm    Osteoporosis  treatment i history:   had 5 to 7 yrs  of alendronate, stopped in 2006 .  Started Prolia in  2015 .  Vitamin D was  checked and WNL at  48 on July 18 . Next due in October.   Thoracic aortic atherosclerosis:  Reviewed findings of prior CT scan today..  Patient is tolerating Crestor   Anemia: referred to [facility]   Had a DNR order signed by [facility]    Outpatient Medications Prior to Visit  Medication Sig Dispense Refill   azelastine (ASTELIN) 0.1 % nasal spray TAKE 2 SPRAYS INTO BOTH NOSTRILS TWICE DAILY AS DIRECTED 30 mL 12   BRIVIACT 25 MG TABS tablet Take 25-50 mg by mouth See admin instructions. '25mg'$  in am , '50mg'$  in pm     butalbital-acetaminophen-caffeine (FIORICET) 50-325-40 MG tablet TAKE ONE TABLET BY MOUTH DAILY AS NEEDED FOR BACK PAIN OR MIGRAINE HEADACHE. MAX OF 3 TABLETS DAILY 90 tablet 3   Cholecalciferol (VITAMIN D) 2000 units CAPS Take 2,000 Units by mouth daily.     ELIQUIS 5 MG TABS tablet TAKE ONE TABLET TWICE DAILY 180 tablet 1   fluticasone (FLONASE) 50 MCG/ACT nasal spray USE 2 SPRAYS IN BOTH NOSTRILS DAILY 16 g 11   Folic Acid 0.8 MG CAPS Take 0.8 mg by mouth daily.     furosemide (LASIX) 40 MG tablet Take one  tablet twice per week. Take an additional 0.5-1 tablet as needed for fluid retention. 30 tablet 11   hydroquinone 4 % cream Apply topically in the morning and at bedtime.      lamoTRIgine (LAMICTAL) 150 MG tablet Take 200 mg by mouth 2 (two) times daily.     losartan (COZAAR) 25 MG tablet Take 0.5 tablets (12.5 mg total) by mouth daily. 45 tablet 3   metoprolol succinate (TOPROL XL) 25 MG 24 hr tablet Take 1 tablet (25 mg total) by mouth daily. 90 tablet 2   montelukast (SINGULAIR) 10 MG tablet TAKE ONE TABLET BY MOUTH AT BEDTIME 90 tablet 1   polyethylene glycol powder (GLYCOLAX/MIRALAX) 17 GM/SCOOP powder Take 1 Dose by mouth daily.     rosuvastatin (CRESTOR) 40 MG tablet TAKE ONE TABLET EVERY DAY 90 tablet 1   spironolactone (ALDACTONE) 25 MG tablet Take 0.5 tablets (12.5 mg total) by mouth every other day.     triamcinolone cream (KENALOG) 0.1 % Apply 1 application topically  2 (two) times daily. 30 g 0   VENTOLIN HFA 108 (90 Base) MCG/ACT inhaler INHALE 2 PUFFS INTO THE LUNGS EVERY 6 HOURS AS NEEDED FOR WHEEZING OR SHORTNESS OF BREATH 18 g 1   vitamin B-12 (CYANOCOBALAMIN) 1000 MCG tablet Take 1,000 mcg by mouth daily.     amoxicillin (AMOXIL) 500 MG tablet Take 4 tablets 60 minutes prior to dental procedures (Patient not taking: No sig reported) 30 tablet 6   BRIVIACT 25 MG TABS tablet Take one tablet ('25MG'$ ) by mouth in the AM and two tablets ('50MG'$ ) by mouth in the PM.     No facility-administered medications prior to visit.    Review of Systems;  Patient denies headache, fevers, malaise, unintentional weight loss, skin rash, eye pain, sinus congestion and sinus pain, sore throat, dysphagia,  hemoptysis , cough, dyspnea, wheezing, chest pain, palpitations, orthopnea, edema, abdominal pain, nausea, melena, diarrhea, constipation, flank pain, dysuria, hematuria, urinary  Frequency, nocturia, numbness, tingling, seizures,  Focal weakness, Loss of consciousness,  Tremor, insomnia, depression, anxiety, and suicidal ideation.      Objective:  BP (!) 96/54 (BP Location: Left Arm, Patient Position: Sitting, Cuff Size: Normal)   Pulse 91   Temp (!) 97 F (36.1 C) (Temporal)   Ht 5' 5.5" (1.664 m)   Wt 145 lb 6.4 oz (66 kg)   SpO2 99%   BMI 23.83 kg/m   BP Readings from Last 3 Encounters:  08/14/21 (!) 96/54  07/24/21 100/60  07/03/21 (!) 101/59    Wt Readings from Last 3 Encounters:  08/14/21 145 lb 6.4 oz (66 kg)  07/24/21 145 lb 6.4 oz (66 kg)  07/03/21 148 lb 14.4 oz (67.5 kg)    General appearance: alert, cooperative and appears stated age Ears: normal TM's and external ear canals both ears Throat: lips, mucosa, and tongue normal; teeth and gums normal Neck: no adenopathy, no carotid bruit, supple, symmetrical, trachea midline and thyroid not enlarged, symmetric, no tenderness/mass/nodules Back: symmetric, no curvature. ROM normal. No CVA  tenderness. Lungs: clear to auscultation bilaterally Heart: regular rate and rhythm, S1, S2 normal, no murmur, click, rub or gallop Abdomen: soft, non-tender; bowel sounds normal; no masses,  no organomegaly Pulses: 2+ and symmetric Skin: Skin color, texture, turgor normal. No rashes or lesions Lymph nodes: Cervical, supraclavicular, and axillary nodes normal.  Lab Results  Component Value Date   HGBA1C 5.5 02/11/2020   HGBA1C 5.7 05/21/2019   HGBA1C 5.3 04/07/2018  Lab Results  Component Value Date   CREATININE 0.85 06/23/2021   CREATININE 0.74 06/22/2021   CREATININE 0.82 06/21/2021    Lab Results  Component Value Date   WBC 4.7 07/24/2021   HGB 10.8 (L) 07/24/2021   HCT 34.0 (L) 07/24/2021   PLT 155 07/24/2021   GLUCOSE 123 (H) 06/23/2021   CHOL 143 05/21/2019   TRIG 60.0 05/21/2019   HDL 59.50 05/21/2019   LDLDIRECT 113.0 02/15/2016   LDLCALC 71 05/21/2019   ALT 17 07/03/2021   AST 25 07/03/2021   NA 132 (L) 06/23/2021   K 3.6 06/23/2021   CL 101 06/23/2021   CREATININE 0.85 06/23/2021   BUN 15 06/23/2021   CO2 24 06/23/2021   TSH 2.991 07/03/2021   INR 1.2 06/18/2021   HGBA1C 5.5 02/11/2020    CT Head Wo Contrast  Result Date: 06/17/2021 CLINICAL DATA:  Fall, on Eliquis EXAM: CT HEAD WITHOUT CONTRAST TECHNIQUE: Contiguous axial images were obtained from the base of the skull through the vertex without intravenous contrast. COMPARISON:  CTA head dated 09/28/2018 FINDINGS: Brain: No evidence of acute infarction, hemorrhage, hydrocephalus, extra-axial collection or mass lesion/mass effect. Global cortical atrophy. Vascular: Intracranial atherosclerosis. Skull: Normal. Negative for fracture or focal lesion. Sinuses/Orbits: The visualized paranasal sinuses are essentially clear. The mastoid air cells are unopacified. Other: None. IMPRESSION: No evidence of acute intracranial abnormality. Mild cortical atrophy. Electronically Signed   By: Julian Hy M.D.   On:  06/17/2021 23:13   DG Chest Port 1 View  Result Date: 06/17/2021 CLINICAL DATA:  Fall femur fracture EXAM: PORTABLE CHEST 1 VIEW COMPARISON:  09/29/2018 FINDINGS: Left-sided pacing device and valve prosthesis as before. Atrial appendage clip. Probable scarring in the right mid lung. Stable enlarged cardiomediastinal silhouette. No acute consolidation, pleural effusion, or pneumothorax. IMPRESSION: Cardiomegaly with slight central congestion. Scarring in the right mid lung. Electronically Signed   By: Donavan Foil M.D.   On: 06/17/2021 22:44   DG Hand Complete Right  Result Date: 06/17/2021 CLINICAL DATA:  Trauma fall EXAM: RIGHT HAND - COMPLETE 3+ VIEW COMPARISON:  None. FINDINGS: Dorsal and ulnar dislocation base of fifth middle phalanx with respect to head of proximal phalanx. No definitive fracture seen. Advanced arthritis at the first [facility] joint and first MCP joint. Triangular fibrocartilage calcification. IMPRESSION: 1. Dislocation at the fifth PIP joint without definitive fracture Electronically Signed   By: Donavan Foil M.D.   On: 06/17/2021 22:40   DG C-Arm 1-60 Min  Result Date: 06/18/2021 CLINICAL DATA:  Elective surgery. Additional history provided: Intramedullary (I a.m.) nail femoral, open reduction internal fixation femur. Provided fluoroscopy time 1 minutes, 59 seconds (15.87 mGy). EXAM: LEFT FEMUR 2 VIEWS; DG C-ARM 1-60 MIN COMPARISON:  Radiographs of the left hip and left femur 06/17/2021. FINDINGS: Eight intraoperative fluoroscopic images of the left femur are submitted. On the provided images, there are findings of interval placement of an intramedullary nail within the left femur. There are 2 proximal interlocking screws which traverse the femoral head/neck. Two distal interlocking screws are also noted. The hardware traverses a femoral diaphyseal fracture. There is near anatomic alignment. Additionally, a lateral plate and multiple screws traverse the fracture site. IMPRESSION:  Eight intraoperative fluoroscopic images of the left femur, as described. Electronically Signed   By: Kellie Simmering DO   On: 06/18/2021 17:22   DG HIP UNILAT WITH PELVIS 2-3 VIEWS LEFT  Result Date: 06/17/2021 CLINICAL DATA:  Fall EXAM: DG HIP (WITH OR WITHOUT PELVIS) 2-3V  LEFT COMPARISON:  04/12/2021 FINDINGS: Pubic symphysis and rami are intact. Mild degenerative changes of both hips. Acute displaced and overriding fracture involving proximal shaft of left femur IMPRESSION: Acute displaced and overriding proximal femoral fracture Electronically Signed   By: Donavan Foil M.D.   On: 06/17/2021 23:06   DG Femur 1 View Left  Result Date: 06/17/2021 CLINICAL DATA:  Fall assess for fracture EXAM: LEFT FEMUR 1 VIEW COMPARISON:  None. FINDINGS: Acute mildly comminuted fracture involving the proximal shaft of the femur with greater than 1 shaft diameter posterior and 1/2 shaft diameter central displacement of distal fracture fragment. About 5 cm of overriding. Femoral head appears normally position. Vascular calcification IMPRESSION: Acute mildly comminuted and displaced fracture involving proximal shaft of left femur Electronically Signed   By: Donavan Foil M.D.   On: 06/17/2021 22:42   DG FEMUR MIN 2 VIEWS LEFT  Result Date: 06/18/2021 CLINICAL DATA:  Postop EXAM: LEFT FEMUR 2 VIEWS COMPARISON:  06/17/2021 FINDINGS: Interval intramedullary rod with proximal and distal screw fixation across proximal femoral shaft fracture. Additional small side plate and fixating screws at the site of fracture. Anatomic alignment. 12 mm bone fragment within the medial upper thigh soft tissues. Gas in the soft tissues consistent with recent surgery. IMPRESSION: Status post internal fixation of left femoral shaft fracture with expected postsurgical change Electronically Signed   By: Donavan Foil M.D.   On: 06/18/2021 19:56   DG FEMUR MIN 2 VIEWS LEFT  Result Date: 06/18/2021 CLINICAL DATA:  Elective surgery. Additional  history provided: Intramedullary (I a.m.) nail femoral, open reduction internal fixation femur. Provided fluoroscopy time 1 minutes, 59 seconds (15.87 mGy). EXAM: LEFT FEMUR 2 VIEWS; DG C-ARM 1-60 MIN COMPARISON:  Radiographs of the left hip and left femur 06/17/2021. FINDINGS: Eight intraoperative fluoroscopic images of the left femur are submitted. On the provided images, there are findings of interval placement of an intramedullary nail within the left femur. There are 2 proximal interlocking screws which traverse the femoral head/neck. Two distal interlocking screws are also noted. The hardware traverses a femoral diaphyseal fracture. There is near anatomic alignment. Additionally, a lateral plate and multiple screws traverse the fracture site. IMPRESSION: Eight intraoperative fluoroscopic images of the left femur, as described. Electronically Signed   By: Kellie Simmering DO   On: 06/18/2021 17:22    Assessment & Plan:   Problem List Items Addressed This Visit       Unprioritized   Thoracic aorta atherosclerosis ([place]) (Chronic)    Reviewed findings of prior CT scan today..  Patient is tolerating high potency statin therapy       Concern about end of life    Now with DNR and MOST forms      Osteoporosis    With recent displaced communiated fracture of left femur which occurred pathologically while standing in her kitchen.  Attributed to history of proonged alendrinate yse from 2000 to 2007. Has been taking prolia since 2015.  Referral to osteoporosis specialist discussed      Relevant Orders   Ambulatory referral to Endocrinology   Do not resuscitate status    I spent  45 mintutes dedicated to the care of this patient on the date of this encounter to include pre-visit review of his medical history,  Face-to-face time with the patient , and post visit ordering of testing and therapeutics.   Medications Discontinued During This Encounter  Medication Reason   BRIVIACT 25 MG TABS tablet  Duplicate  Follow-up: No follow-ups on file.   [name], MD

## 2021-08-23 ENCOUNTER — Other Ambulatory Visit: Payer: Self-pay

## 2021-08-23 ENCOUNTER — Ambulatory Visit (INDEPENDENT_AMBULATORY_CARE_PROVIDER_SITE_OTHER): Payer: Medicare Other

## 2021-08-23 DIAGNOSIS — Z23 Encounter for immunization: Secondary | ICD-10-CM

## 2021-08-24 DIAGNOSIS — R944 Abnormal results of kidney function studies: Secondary | ICD-10-CM

## 2021-08-27 ENCOUNTER — Other Ambulatory Visit: Payer: Self-pay | Admitting: Cardiovascular Disease

## 2021-08-27 NOTE — Telephone Encounter (Signed)
Refill request

## 2021-08-27 NOTE — Telephone Encounter (Signed)
Eliquis 5 mg refill request received. Patient is 80 years old, weight-66 kg, Crea- 1.2 on 08/21/21, Diagnosis-afib, and last seen by Dr. Rockey Situ on 06/05/21. Dose is appropriate based on dosing criteria. Will send in refill to requested pharmacy.

## 2021-08-28 ENCOUNTER — Ambulatory Visit (INDEPENDENT_AMBULATORY_CARE_PROVIDER_SITE_OTHER): Payer: Medicare Other

## 2021-08-28 DIAGNOSIS — I442 Atrioventricular block, complete: Secondary | ICD-10-CM | POA: Diagnosis not present

## 2021-08-29 ENCOUNTER — Other Ambulatory Visit: Payer: Self-pay | Admitting: Internal Medicine

## 2021-08-29 DIAGNOSIS — R748 Abnormal levels of other serum enzymes: Secondary | ICD-10-CM

## 2021-08-29 LAB — CUP PACEART REMOTE DEVICE CHECK
Battery Impedance: 628 Ohm
Battery Remaining Longevity: 66 mo
Battery Voltage: 2.79 V
Brady Statistic AP VP Percent: 96 %
Brady Statistic AP VS Percent: 0 %
Brady Statistic AS VP Percent: 4 %
Brady Statistic AS VS Percent: 0 %
Date Time Interrogation Session: 20220927100048
Implantable Lead Implant Date: 20161205
Implantable Lead Implant Date: 20170321
Implantable Lead Location: 753859
Implantable Lead Location: 753860
Implantable Lead Model: 5076
Implantable Lead Model: 5076
Implantable Pulse Generator Implant Date: 20161205
Lead Channel Impedance Value: 406 Ohm
Lead Channel Impedance Value: 716 Ohm
Lead Channel Pacing Threshold Amplitude: 0.625 V
Lead Channel Pacing Threshold Amplitude: 0.625 V
Lead Channel Pacing Threshold Pulse Width: 0.4 ms
Lead Channel Pacing Threshold Pulse Width: 0.4 ms
Lead Channel Setting Pacing Amplitude: 2 V
Lead Channel Setting Pacing Amplitude: 2.5 V
Lead Channel Setting Pacing Pulse Width: 0.4 ms
Lead Channel Setting Sensing Sensitivity: 4 mV

## 2021-09-05 NOTE — Progress Notes (Signed)
Remote pacemaker transmission.   

## 2021-09-11 ENCOUNTER — Other Ambulatory Visit: Payer: Self-pay

## 2021-09-11 ENCOUNTER — Other Ambulatory Visit (INDEPENDENT_AMBULATORY_CARE_PROVIDER_SITE_OTHER): Payer: Medicare Other

## 2021-09-11 DIAGNOSIS — R944 Abnormal results of kidney function studies: Secondary | ICD-10-CM

## 2021-09-11 DIAGNOSIS — R748 Abnormal levels of other serum enzymes: Secondary | ICD-10-CM | POA: Diagnosis not present

## 2021-09-11 LAB — HEPATIC FUNCTION PANEL
ALT: 9 U/L (ref 0–35)
AST: 17 U/L (ref 0–37)
Albumin: 4.3 g/dL (ref 3.5–5.2)
Alkaline Phosphatase: 95 U/L (ref 39–117)
Bilirubin, Direct: 0.1 mg/dL (ref 0.0–0.3)
Total Bilirubin: 0.4 mg/dL (ref 0.2–1.2)
Total Protein: 6.4 g/dL (ref 6.0–8.3)

## 2021-09-11 LAB — BASIC METABOLIC PANEL
BUN: 25 mg/dL — ABNORMAL HIGH (ref 6–23)
CO2: 30 mEq/L (ref 19–32)
Calcium: 9.4 mg/dL (ref 8.4–10.5)
Chloride: 99 mEq/L (ref 96–112)
Creatinine, Ser: 0.97 mg/dL (ref 0.40–1.20)
GFR: 55.04 mL/min — ABNORMAL LOW (ref >60.00)
Glucose, Bld: 91 mg/dL (ref 70–99)
Potassium: 4 mEq/L (ref 3.5–5.1)
Sodium: 135 mEq/L (ref 135–145)

## 2021-09-24 ENCOUNTER — Other Ambulatory Visit: Payer: Self-pay | Admitting: Internal Medicine

## 2021-09-24 ENCOUNTER — Telehealth: Payer: Self-pay | Admitting: Internal Medicine

## 2021-09-24 DIAGNOSIS — Z1231 Encounter for screening mammogram for malignant neoplasm of breast: Secondary | ICD-10-CM

## 2021-09-24 NOTE — Telephone Encounter (Signed)
Pt called in wanted to know when can she have her prolia shot?

## 2021-09-24 NOTE — Telephone Encounter (Signed)
Appointment scheduled Prolia submitted or authorization.

## 2021-09-28 NOTE — Telephone Encounter (Signed)
Patient scheduled.

## 2021-10-02 ENCOUNTER — Encounter: Payer: Medicare Other | Admitting: Internal Medicine

## 2021-10-03 ENCOUNTER — Inpatient Hospital Stay (HOSPITAL_BASED_OUTPATIENT_CLINIC_OR_DEPARTMENT_OTHER): Payer: Medicare Other | Admitting: Oncology

## 2021-10-03 ENCOUNTER — Inpatient Hospital Stay: Payer: Medicare Other | Attending: Oncology

## 2021-10-03 ENCOUNTER — Other Ambulatory Visit: Payer: Self-pay

## 2021-10-03 ENCOUNTER — Encounter: Payer: Self-pay | Admitting: Oncology

## 2021-10-03 VITALS — BP 106/70 | HR 85 | Temp 97.0°F | Wt 145.2 lb

## 2021-10-03 DIAGNOSIS — D696 Thrombocytopenia, unspecified: Secondary | ICD-10-CM

## 2021-10-03 DIAGNOSIS — R7402 Elevation of levels of lactic acid dehydrogenase (LDH): Secondary | ICD-10-CM | POA: Insufficient documentation

## 2021-10-03 DIAGNOSIS — D649 Anemia, unspecified: Secondary | ICD-10-CM | POA: Diagnosis present

## 2021-10-03 DIAGNOSIS — D72820 Lymphocytosis (symptomatic): Secondary | ICD-10-CM | POA: Diagnosis not present

## 2021-10-03 LAB — CBC WITH DIFFERENTIAL/PLATELET
Abs Immature Granulocytes: 0.01 10*3/uL (ref 0.00–0.07)
Basophils Absolute: 0.1 10*3/uL (ref 0.0–0.1)
Basophils Relative: 1 %
Eosinophils Absolute: 0.2 10*3/uL (ref 0.0–0.5)
Eosinophils Relative: 5 %
HCT: 33.7 % — ABNORMAL LOW (ref 36.0–46.0)
Hemoglobin: 10.8 g/dL — ABNORMAL LOW (ref 12.0–15.0)
Immature Granulocytes: 0 %
Lymphocytes Relative: 32 %
Lymphs Abs: 1.5 10*3/uL (ref 0.7–4.0)
MCH: 30.3 pg (ref 26.0–34.0)
MCHC: 32 g/dL (ref 30.0–36.0)
MCV: 94.4 fL (ref 80.0–100.0)
Monocytes Absolute: 0.4 10*3/uL (ref 0.1–1.0)
Monocytes Relative: 9 %
Neutro Abs: 2.5 10*3/uL (ref 1.7–7.7)
Neutrophils Relative %: 53 %
Platelets: 124 10*3/uL — ABNORMAL LOW (ref 150–400)
RBC: 3.57 MIL/uL — ABNORMAL LOW (ref 3.87–5.11)
RDW: 13.2 % (ref 11.5–15.5)
WBC: 4.7 10*3/uL (ref 4.0–10.5)
nRBC: 0 % (ref 0.0–0.2)

## 2021-10-03 LAB — COMPREHENSIVE METABOLIC PANEL
ALT: 13 U/L (ref 0–44)
AST: 20 U/L (ref 15–41)
Albumin: 4.3 g/dL (ref 3.5–5.0)
Alkaline Phosphatase: 107 U/L (ref 38–126)
Anion gap: 8 (ref 5–15)
BUN: 28 mg/dL — ABNORMAL HIGH (ref 8–23)
CO2: 28 mmol/L (ref 22–32)
Calcium: 9.1 mg/dL (ref 8.9–10.3)
Chloride: 100 mmol/L (ref 98–111)
Creatinine, Ser: 1 mg/dL (ref 0.44–1.00)
GFR, Estimated: 57 mL/min — ABNORMAL LOW (ref >60)
Glucose, Bld: 91 mg/dL (ref 70–99)
Potassium: 4 mmol/L (ref 3.5–5.1)
Sodium: 136 mmol/L (ref 135–145)
Total Bilirubin: 0.7 mg/dL (ref 0.3–1.2)
Total Protein: 6.8 g/dL (ref 6.5–8.1)

## 2021-10-03 LAB — LACTATE DEHYDROGENASE: LDH: 233 U/L — ABNORMAL HIGH (ref 98–192)

## 2021-10-03 NOTE — Progress Notes (Signed)
Patient here for follow up. Patient voiced no concerns.

## 2021-10-03 NOTE — Progress Notes (Signed)
Hematology/Oncology progress note [facility] [phone] [phone]   Patient Care Team: [name], MD as PCP - General (Internal Medicine) Evans Lance, MD as PCP - Electrophysiology (Cardiology) Minna Merritts, MD as PCP - Cardiology (Cardiology) Bary Castilla, Forest Gleason, MD (General Surgery) [name], MD (Internal Medicine) [name], MD (Internal Medicine)  REFERRING PROVIDER: [name], MD  CHIEF COMPLAINTS/REASON FOR VISIT:  Follow up for anemia and thrombocytopenia.   HISTORY OF PRESENTING ILLNESS:   [name] [name] is a  80 y.o.  female with PMH listed below was seen in consultation at the request of  [name], MD  for evaluation of anemia and thrombocytopenia.  06/17/2021- 06/23/2021 patient was hospitalized due to left displaced femoral fracture and dislocation of at the fifth PIP. She was evaluated by orthopedic surgery and underwent ORIF on 06/18/2021.  Finger dislocation was also reduced in the OR. Patient has chronic anemia with hemoglobin around 11 before her admission.  Hemoglobin dropped to 7.4 dueing her hospitalization s/p PRBC transfusion. Hemoglobin improved 8 on 06/22/2021. At discharge, 06/23/2021 cbc showed hemoglobin 7.4, platelet count 110000  Patient reports feeling fatigue.   INTERVAL HISTORY [name] [name] is a 80 y.o. female who has above history reviewed by me today presents for follow up visit for anemia  problems and complaints are listed below: Patient has been discharged from SNF and now lives at home Patient reports feeling much better and stronger.  She has home physical therapy.  No new complaints.  Chronic left thumb deformity is secondary to trauma/fall   INTERVAL HISTORY [name] [name] is a 80 y.o. female who has above history reviewed by me today presents for follow up visit for chronic anemia, thrombocytopenia, monoclonal lymphocytosis of unknown  significance. Problems and complaints are listed below: Patient has no new complaints.   Review of Systems  Constitutional:  Positive for fatigue. Negative for appetite change, chills and fever.  HENT:   Negative for hearing loss and voice change.   Eyes:  Negative for eye problems.  Respiratory:  Negative for chest tightness, cough and shortness of breath.   Cardiovascular:  Negative for chest pain.  Gastrointestinal:  Negative for abdominal distention, abdominal pain and blood in stool.  Endocrine: Negative for hot flashes.  Genitourinary:  Negative for difficulty urinating and frequency.   Musculoskeletal:  Negative for arthralgias.       History of femoral fracture.   Skin:  Negative for itching and rash.  Neurological:  Negative for extremity weakness.  Hematological:  Negative for adenopathy.  Psychiatric/Behavioral:  Negative for confusion.    MEDICAL HISTORY:  Past Medical History:  Diagnosis Date   Acute on chronic diastolic (congestive) heart failure ([place])    Allergy    See list   Anemia 2018   Anxiety    Occasionally take Xanax for sleep   Anxiety associated with depression    Prn alprazolam    Anxiety associated with depression    Arthritis    Hands, Back   Atrial fibrillation, persistent (HCC)    DCCV 08/22/2015   Bradycardia post-op bradycardia, pacer dependent   MDT PPM 11/06/15, Dr. Lovena Le   Cataract    Left eye   Diverticulosis    Focal seizures ([place])    Heart murmur    Hemorrhoid    Hepatic cyst    innumerable   History of asbestos exposure    Hyperlipidemia    IBS (irritable bowel  syndrome)    Idiopathic thrombocytopenic purpura (ITP) (HCC)    Migraine    MVP (mitral valve prolapse)    Nodule of right lung    Osteoporosis of forearm    RBBB    Restrictive lung disease    Mild on PFT & likely cardiac in etiology    S/P Minimally invasive maze operation for atrial fibrillation 10/31/2015   Complete bilateral atrial lesion set using cryothermy  and bipolar radiofrequency ablation with clipping of LA appendage via right mini thoracotomy approach   S/P minimally invasive mitral valve replacement with bioprosthetic valve 10/31/2015   33 mm [facility] bovine bioprosthetic tissue valve placed via right mini thoracotomy approach   Severe mitral regurgitation    Skin cancer, 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   SVT (supraventricular tachycardia) ([place])    Thoracic aorta atherosclerosis ([place])    TIA (transient ischemic attack)     SURGICAL HISTORY: Past Surgical History:  Procedure Laterality Date   BREAST EXCISIONAL BIOPSY Right Late 80s   Negative X2   CARDIAC CATHETERIZATION N/A 10/18/2015   Procedure: Right/Left Heart Cath and Coronary Angiography;  Surgeon: Sherren Mocha, MD;  Location: [place] CV LAB;  Service: Cardiovascular;  Laterality: N/A;   CARDIOVERSION N/A 08/22/2015   Procedure: CARDIOVERSION;  Surgeon: Thayer Headings, MD;  Location: [facility] ENDOSCOPY;  Service: Cardiovascular;  Laterality: N/A;   COLONOSCOPY  2003   EP IMPLANTABLE DEVICE N/A 11/06/2015   Procedure: Pacemaker Implant;  Surgeon: Evans Lance, MD;  Location: [place] CV LAB;  Service: Cardiovascular;  Laterality: N/A;   EP IMPLANTABLE DEVICE N/A 02/20/2016   Procedure: Lead Extraction;  Surgeon: Evans Lance, MD;  Location: [place] CV LAB;  Service: Cardiovascular;  Laterality: N/A;   EYE SURGERY Right 2013   FEMUR IM NAIL Left 06/18/2021   Procedure: INTRAMEDULLARY (IM) NAIL FEMORAL, OPEN REDUCTION INTERNAL FIXATION FEMUR RIGHT LITTLE FINGER PIP CLOSED REDUCTION;  Surgeon: Altamese Vayas, MD;  Location: [place];  Service: Orthopedics;  Laterality: Left;   FOOT SURGERY     MANDIBLE FRACTURE SURGERY  03-26-13   MINIMALLY INVASIVE MAZE PROCEDURE N/A 10/31/2015   Procedure: MINIMALLY INVASIVE MAZE PROCEDURE;  Surgeon: Rexene Alberts, MD;  Location: [place];  Service: Open Heart Surgery;  Laterality: N/A;   MITRAL VALVE REPLACEMENT Right  10/31/2015   Procedure: MINIMALLY INVASIVE MITRAL VALVE (MV) REPLACEMENT;  Surgeon: Rexene Alberts, MD;  Location: [place];  Service: Open Heart Surgery;  Laterality: Right;   PACEMAKER LEAD REMOVAL  02/20/2016   TEE WITH CARDIOVERSION     TEE WITHOUT CARDIOVERSION N/A 08/22/2015   Procedure: TRANSESOPHAGEAL ECHOCARDIOGRAM (TEE);  Surgeon: Thayer Headings, MD;  Location: [place];  Service: Cardiovascular;  Laterality: N/A;   TEE WITHOUT CARDIOVERSION N/A 10/31/2015   Procedure: TRANSESOPHAGEAL ECHOCARDIOGRAM (TEE);  Surgeon: Rexene Alberts, MD;  Location: [place];  Service: Open Heart Surgery;  Laterality: N/A;   TUBAL LIGATION     VARICOSE VEIN SURGERY Right     SOCIAL HISTORY: Social History   Socioeconomic History   Marital status: Widowed    Spouse name: Deceased   Number of children: 2   Years of education: 16   Highest education level: Not on file  Occupational History   Occupation: Retired  Tobacco Use   Smoking status: Never   Smokeless tobacco: Never  Vaping Use   Vaping Use: Never used  Substance and Sexual Activity   Alcohol use: No  Alcohol/week: 3.0 standard drinks    Types: 3 Standard drinks or equivalent per week   Drug use: No   Sexual activity: Not Currently  Other Topics Concern   Not on file  Social History Narrative   ** Merged History Encounter **       She is a widow.  Husband died from metastatic renal cell cancer. Originally from [place]. Previously lived in [place] from 1975-[place]. Moved to [place] in 2007. No mold exposure recently but did have it through a prior work exposure in 1993. Has a masters in    public health. No bird exposure.  [facility] (09/29/17): She has moved into a retirement community since last appointment. She reports she had testing at her new residence that was positive for mold. It has since been treated.    Social Determinants of Health   Financial Resource Strain: Not on file  Food Insecurity: Not on file   Transportation Needs: Not on file  Physical Activity: Not on file  Stress: Not on file  Social Connections: Not on file  Intimate Partner Violence: Not on file    FAMILY HISTORY: Family History  Problem Relation Age of Onset   Hypertension Mother    Arrhythmia Mother    Heart failure Mother    Arrhythmia Brother    Stroke Brother 61       cerebral hemorrhage, nonsmoker, no HTN   Prostate cancer Brother    Stroke Father        from an aneurysm   Stroke Maternal Aunt 83       cerebral hemorrhage   Liver cancer Maternal Grandmother    Atrial fibrillation Son    Celiac disease Son    Heart attack Neg Hx    Breast cancer Neg Hx     ALLERGIES:  is allergic to acetaminophen-codeine, codeine, epinephrine, hydrocodone, hydromorphone, oxycodone, meloxicam, codeine, dextromethorphan, diphen [diphenhydramine hcl], diphenhydramine, diphenhydramine hcl, diphenylpyraline, doxycycline, doxycycline, hydrocodone, hydromorphone, meloxicam, mobic [meloxicam], nsaids, nsaids, oxycodone, propofol, propofol, and tylenol [acetaminophen].  MEDICATIONS:  Current Outpatient Medications  Medication Sig Dispense Refill   azelastine (ASTELIN) 0.1 % nasal spray TAKE 2 SPRAYS INTO BOTH NOSTRILS TWICE DAILY AS DIRECTED 30 mL 12   BRIVIACT 25 MG TABS tablet Take 25-50 mg by mouth See admin instructions. 25mg  in am , 50mg  in pm     butalbital-acetaminophen-caffeine (FIORICET) 50-325-40 MG tablet TAKE ONE TABLET BY MOUTH DAILY AS NEEDED FOR BACK PAIN OR MIGRAINE HEADACHE. MAX OF 3 TABLETS DAILY 90 tablet 3   Cholecalciferol (VITAMIN D) 2000 units CAPS Take 2,000 Units by mouth daily.     ELIQUIS 5 MG TABS tablet TAKE ONE TABLET TWICE DAILY 180 tablet 1   fluticasone (FLONASE) 50 MCG/ACT nasal spray USE 2 SPRAYS IN BOTH NOSTRILS DAILY 16 g 11   Folic Acid 0.8 MG CAPS Take 0.8 mg by mouth daily.     furosemide (LASIX) 40 MG tablet Take one tablet twice per week. Take an additional 0.5-1 tablet as needed for  fluid retention. 30 tablet 11   hydroquinone 4 % cream Apply topically in the morning and at bedtime.      lamoTRIgine (LAMICTAL) 150 MG tablet Take 200 mg by mouth 2 (two) times daily.     losartan (COZAAR) 25 MG tablet Take 0.5 tablets (12.5 mg total) by mouth daily. 45 tablet 3   metoprolol succinate (TOPROL XL) 25 MG 24 hr tablet Take 1 tablet (25 mg total) by mouth daily. 90 tablet 2  montelukast (SINGULAIR) 10 MG tablet TAKE ONE TABLET BY MOUTH AT BEDTIME 90 tablet 1   polyethylene glycol powder (GLYCOLAX/MIRALAX) 17 GM/SCOOP powder Take 1 Dose by mouth daily.     rosuvastatin (CRESTOR) 40 MG tablet TAKE ONE TABLET EVERY DAY 90 tablet 1   spironolactone (ALDACTONE) 25 MG tablet Take 0.5 tablets (12.5 mg total) by mouth every other day.     triamcinolone cream (KENALOG) 0.1 % Apply 1 application topically 2 (two) times daily. 30 g 0   VENTOLIN HFA 108 (90 Base) MCG/ACT inhaler INHALE 2 PUFFS INTO THE LUNGS EVERY 6 HOURS AS NEEDED FOR WHEEZING OR SHORTNESS OF BREATH 18 g 1   vitamin B-12 (CYANOCOBALAMIN) 1000 MCG tablet Take 1,000 mcg by mouth daily.     amoxicillin (AMOXIL) 500 MG tablet Take 4 tablets 60 minutes prior to dental procedures (Patient not taking: No sig reported) 30 tablet 6   No current facility-administered medications for this visit.     PHYSICAL EXAMINATION: ECOG PERFORMANCE STATUS: 1 - Symptomatic but completely ambulatory Vitals:   10/03/21 0928  BP: 106/70  Pulse: 85  Temp: (!) 97 F (36.1 C)   Filed Weights   10/03/21 0928  Weight: 145 lb 3.2 oz (65.9 kg)    Physical Exam Constitutional:      General: She is not in acute distress.    Comments: Frail, patient walks with a walker  HENT:     Head: Normocephalic and atraumatic.  Eyes:     General: No scleral icterus. Cardiovascular:     Rate and Rhythm: Normal rate and regular rhythm.     Heart sounds: Normal heart sounds.  Pulmonary:     Effort: Pulmonary effort is normal. No respiratory distress.      Breath sounds: No wheezing.  Abdominal:     General: Bowel sounds are normal. There is no distension.     Palpations: Abdomen is soft.  Musculoskeletal:        General: Deformity present. Normal range of motion.     Cervical back: Normal range of motion and neck supple.     Comments: Left thumb  Skin:    General: Skin is warm and dry.     Findings: No erythema or rash.  Neurological:     Mental Status: She is alert and oriented to person, place, and time. Mental status is at baseline.     Cranial Nerves: No cranial nerve deficit.     Coordination: Coordination normal.  Psychiatric:        Mood and Affect: Mood normal.    LABORATORY DATA:  I have reviewed the data as listed Lab Results  Component Value Date   WBC 4.7 10/03/2021   HGB 10.8 (L) 10/03/2021   HCT 33.7 (L) 10/03/2021   MCV 94.4 10/03/2021   PLT 124 (L) 10/03/2021   Recent Labs    01/25/21 1417 02/08/21 1446 06/22/21 0425 06/23/21 0333 07/03/21 1618 09/11/21 1121 10/03/21 0913  NA 141   < > 134* 132*  --  135 136  K 4.0   < > 3.4* 3.6  --  4.0 4.0  CL 100   < > 105 101  --  99 100  CO2 20   < > 24 24  --  30 28  GLUCOSE 94   < > 102* 123*  --  91 91  BUN 26   < > 16 15  --  25* 28*  CREATININE 0.98   < > 0.74 0.85  --  0.97 1.00  CALCIUM 9.3   < > 7.8* 7.8*  --  9.4 9.1  GFRNONAA 55*   < > >60 >60  --   --  57*  GFRAA 63  --   --   --   --   --   --   PROT  --    < >  --   --  6.9 6.4 6.8  ALBUMIN  --    < >  --   --  4.4 4.3 4.3  AST  --    < >  --   --  25 17 20   ALT  --    < >  --   --  17 9 13   ALKPHOS  --    < >  --   --  95 95 107  BILITOT  --    < >  --   --  1.1 0.4 0.7  BILIDIR  --   --   --   --  0.2 0.1  --   IBILI  --   --   --   --  0.9  --   --    < > = values in this interval not displayed.    Iron/TIBC/Ferritin/ %Sat    Component Value Date/Time   IRON 69 07/03/2021 1618   IRON 74 04/08/2013 1150   TIBC 316 07/03/2021 1618   TIBC 296 04/08/2013 1150   FERRITIN 218  07/03/2021 1618   FERRITIN 65 04/08/2013 1150   IRONPCTSAT 22 07/03/2021 1618   IRONPCTSAT 26 08/23/2019 0817      RADIOGRAPHIC STUDIES: I have personally reviewed the radiological images as listed and agreed with the findings in the report. No results found.    ASSESSMENT & PLAN:  1. Thrombocytopenia ([place])   2. Normocytic anemia   3. Monoclonal B-cell lymphocytosis of undetermined significance    #Acute on chronic anemia.  Anemia of chronic disease. Labs are reviewed and are discussed with patient. Hemoglobin has improved and is close to her chronic anemia baseline. She has normal iron panel, B12 and folate, no M protein on SPEP Continue monitor.  # Elevated LDH, chronic Peripheral blood flow cytometry showed monoclonal lymphocytosis of unknown significance. Discussed with patient about the diagnosis.  This could represent a pre-CLL condition.  No intervention needed at this point.  Continue monitor twice a year.  # Thrombocytopenia, stable at her baseline.  Probably ITP.   Orders Placed This Encounter  Procedures   CBC with Differential/Platelet    Standing Status:   Future    Standing Expiration Date:   10/03/2022   Comprehensive metabolic panel    Standing Status:   Future    Standing Expiration Date:   10/03/2022   Lactate dehydrogenase    Standing Status:   Future    Standing Expiration Date:   10/03/2022   Technologist smear review    Standing Status:   Future    Standing Expiration Date:   10/03/2022    All questions were answered. The patient knows to call the clinic with any problems questions or concerns.   [name], MD    Return of visit: Follow-up in 6 months, lab NP    Earlie Server, MD, PhD Hematology Oncology [facility] Pager- [phone] 10/03/2021

## 2021-10-04 ENCOUNTER — Telehealth: Payer: Self-pay | Admitting: Internal Medicine

## 2021-10-04 NOTE — Telephone Encounter (Signed)
Patient called and said she is starting drug problem and must stop taking her prolia for one year. Patient said she was sending a MyChart message to Dr Derrel Nip with further details about the program.

## 2021-10-05 NOTE — Telephone Encounter (Signed)
FYI

## 2021-10-16 ENCOUNTER — Other Ambulatory Visit: Payer: Self-pay

## 2021-10-16 ENCOUNTER — Ambulatory Visit
Admission: RE | Admit: 2021-10-16 | Discharge: 2021-10-16 | Disposition: A | Discharge status: Home / Self Care | Payer: Medicare Other | Source: Ambulatory Visit | Attending: Internal Medicine | Admitting: Internal Medicine

## 2021-10-16 DIAGNOSIS — Z1231 Encounter for screening mammogram for malignant neoplasm of breast: Secondary | ICD-10-CM | POA: Insufficient documentation

## 2021-10-22 ENCOUNTER — Ambulatory Visit: Payer: Medicare Other

## 2021-10-31 ENCOUNTER — Encounter: Payer: Self-pay | Admitting: Internal Medicine

## 2021-11-05 ENCOUNTER — Other Ambulatory Visit: Payer: Self-pay | Admitting: Family

## 2021-11-05 DIAGNOSIS — I4819 Other persistent atrial fibrillation: Secondary | ICD-10-CM

## 2021-11-05 DIAGNOSIS — I5022 Chronic systolic (congestive) heart failure: Secondary | ICD-10-CM

## 2021-11-08 ENCOUNTER — Encounter: Payer: Self-pay | Admitting: Internal Medicine

## 2021-11-08 ENCOUNTER — Ambulatory Visit (INDEPENDENT_AMBULATORY_CARE_PROVIDER_SITE_OTHER): Payer: Medicare Other | Admitting: Internal Medicine

## 2021-11-08 ENCOUNTER — Other Ambulatory Visit: Payer: Self-pay

## 2021-11-08 VITALS — BP 94/68 | HR 76 | Ht 64.0 in | Wt 146.0 lb

## 2021-11-08 DIAGNOSIS — I480 Paroxysmal atrial fibrillation: Secondary | ICD-10-CM

## 2021-11-08 DIAGNOSIS — Z950 Presence of cardiac pacemaker: Secondary | ICD-10-CM | POA: Diagnosis not present

## 2021-11-08 DIAGNOSIS — I495 Sick sinus syndrome: Secondary | ICD-10-CM

## 2021-11-08 DIAGNOSIS — I6523 Occlusion and stenosis of bilateral carotid arteries: Secondary | ICD-10-CM

## 2021-11-08 DIAGNOSIS — I442 Atrioventricular block, complete: Secondary | ICD-10-CM | POA: Diagnosis not present

## 2021-11-08 NOTE — Progress Notes (Signed)
HPI [name] returns today for PPM followup. She is a pleasant 81 yo woman with symptomatic bradycardia due to both sinus node dysfunction and heart block after MAZE/MV repair.  She has known atrial fib and is on systemic anti-coagulation. She has done well in the interim with no chest pain or sob. No edema. She broke her left leg a several of months ago. She has gradually improved.  Allergies  Allergen Reactions   Acetaminophen-Codeine Other (See Comments)   Codeine Nausea And Vomiting and Other (See Comments)    migraine   Epinephrine Palpitations   Hydrocodone Nausea And Vomiting and Other (See Comments)    MIGRAINE   Hydromorphone Nausea And Vomiting and Other (See Comments)    migraine   Oxycodone Nausea And Vomiting and Other (See Comments)    Severe migraine   Meloxicam Other (See Comments)    Severe reflux   Codeine Other (See Comments)    Migraine   Dextromethorphan Other (See Comments)    seizures   Diphen [Diphenhydramine Hcl] Other (See Comments)    seizure   Diphenhydramine Other (See Comments)    seizure   Diphenhydramine Hcl     Other reaction(s): Other (See Comments)   Diphenylpyraline Other (See Comments)   Doxycycline Itching, Swelling and Other (See Comments)    Facial   Doxycycline Itching and Swelling   Hydrocodone Other (See Comments)    Migraine   Hydromorphone Other (See Comments)    Migraine   Meloxicam Other (See Comments)   Mobic [Meloxicam] Other (See Comments)    reflux   Nsaids    Nsaids Other (See Comments)    Pt on blood thinner   Oxycodone Other (See Comments)    Migraine   Propofol Other (See Comments)   Propofol Other (See Comments)    Very sensitive; patient stated she was told she was told she had apnea   Tylenol [Acetaminophen] Other (See Comments)    TYLENOL #3 - MIGRAINE     Current Outpatient Medications  Medication Sig Dispense Refill   amoxicillin (AMOXIL) 500 MG tablet Take 4 tablets 60 minutes prior to  dental procedures (Patient not taking: No sig reported) 30 tablet 6   azelastine (ASTELIN) 0.1 % nasal spray TAKE 2 SPRAYS INTO BOTH NOSTRILS TWICE DAILY AS DIRECTED 30 mL 12   BRIVIACT 25 MG TABS tablet Take 25-50 mg by mouth See admin instructions. 25mg  in am , 50mg  in pm     butalbital-acetaminophen-caffeine (FIORICET) 50-325-40 MG tablet TAKE ONE TABLET BY MOUTH DAILY AS NEEDED FOR BACK PAIN OR MIGRAINE HEADACHE. MAX OF 3 TABLETS DAILY 90 tablet 3   Cholecalciferol (VITAMIN D) 2000 units CAPS Take 2,000 Units by mouth daily.     ELIQUIS 5 MG TABS tablet TAKE ONE TABLET TWICE DAILY 180 tablet 1   fluticasone (FLONASE) 50 MCG/ACT nasal spray USE 2 SPRAYS IN BOTH NOSTRILS DAILY 16 g 11   Folic Acid 0.8 MG CAPS Take 0.8 mg by mouth daily.     furosemide (LASIX) 40 MG tablet Take one tablet twice per week. Take an additional 0.5-1 tablet as needed for fluid retention. 30 tablet 11   hydroquinone 4 % cream Apply topically in the morning and at bedtime.      lamoTRIgine (LAMICTAL) 150 MG tablet Take 200 mg by mouth 2 (two) times daily.     losartan (COZAAR) 25 MG tablet Take 0.5 tablets (12.5 mg total) by mouth daily. 45 tablet 3  metoprolol succinate (TOPROL-XL) 25 MG 24 hr tablet TAKE 1 TABLET BY MOUTH DAILY 90 tablet 2   montelukast (SINGULAIR) 10 MG tablet TAKE ONE TABLET BY MOUTH AT BEDTIME 90 tablet 1   polyethylene glycol powder (GLYCOLAX/MIRALAX) 17 GM/SCOOP powder Take 1 Dose by mouth daily.     rosuvastatin (CRESTOR) 40 MG tablet TAKE ONE TABLET EVERY DAY 90 tablet 1   spironolactone (ALDACTONE) 25 MG tablet Take 0.5 tablets (12.5 mg total) by mouth every other day.     triamcinolone cream (KENALOG) 0.1 % Apply 1 application topically 2 (two) times daily. 30 g 0   VENTOLIN HFA 108 (90 Base) MCG/ACT inhaler INHALE 2 PUFFS INTO THE LUNGS EVERY 6 HOURS AS NEEDED FOR WHEEZING OR SHORTNESS OF BREATH 18 g 1   vitamin B-12 (CYANOCOBALAMIN) 1000 MCG tablet Take 1,000 mcg by mouth daily.     No  current facility-administered medications for this visit.     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 Lovena Le   Cataract    Left eye   Diverticulosis    Focal seizures ([place])    Heart murmur    Hemorrhoid    Hepatic cyst    innumerable   History of asbestos exposure    Hyperlipidemia    IBS (irritable bowel syndrome)    Idiopathic thrombocytopenic purpura (ITP) (HCC)    Migraine    MVP (mitral valve prolapse)    Nodule of right lung    Osteoporosis of forearm    RBBB    Restrictive lung disease    Mild on PFT & likely cardiac in etiology    S/P Minimally invasive maze operation for atrial fibrillation 10/31/2015   Complete bilateral atrial lesion set using cryothermy and bipolar radiofrequency ablation with clipping of LA appendage via right mini thoracotomy approach   S/P minimally invasive mitral valve replacement with bioprosthetic valve 10/31/2015   33 mm [facility] Mitral bovine bioprosthetic tissue valve placed via right mini thoracotomy approach   Severe mitral regurgitation    Skin cancer, 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   Past Surgical History:  Procedure Laterality Date   BREAST EXCISIONAL BIOPSY Right Late 80s   Negative X2   CARDIAC CATHETERIZATION N/A 10/18/2015   Procedure: Right/Left Heart Cath and Coronary Angiography;  Surgeon: Sherren Mocha, MD;   Location: [facility];  Service: Cardiovascular;  Laterality: N/A;   CARDIOVERSION N/A 08/22/2015   Procedure: CARDIOVERSION;  Surgeon: Thayer Headings, MD;  Location: [facility] ENDOSCOPY;  Service: Cardiovascular;  Laterality: N/A;   COLONOSCOPY  2003   EP IMPLANTABLE DEVICE N/A 11/06/2015   Procedure: Pacemaker Implant;  Surgeon: Evans Lance, MD;  Location: [facility];  Service: Cardiovascular;  Laterality: N/A;   EP IMPLANTABLE DEVICE N/A  02/20/2016   Procedure: Lead Extraction;  Surgeon: Evans Lance, MD;  Location: [place] [facility];  Service: Cardiovascular;  Laterality: N/A;   EYE SURGERY Right 2013   FEMUR IM NAIL Left 06/18/2021   Procedure: INTRAMEDULLARY (IM) NAIL FEMORAL, OPEN REDUCTION INTERNAL FIXATION FEMUR RIGHT LITTLE FINGER PIP CLOSED REDUCTION;  Surgeon: Altamese Moravia, MD;  Location: [facility];  Service: Orthopedics;  Laterality: Left;   FOOT SURGERY     MANDIBLE FRACTURE SURGERY  03-26-13   MINIMALLY INVASIVE MAZE PROCEDURE N/A 10/31/2015   Procedure: MINIMALLY INVASIVE MAZE PROCEDURE;  Surgeon: Rexene Alberts, MD;  Location: [facility];  Service: Open Heart Surgery;  Laterality: N/A;   MITRAL VALVE REPLACEMENT Right 10/31/2015   Procedure: MINIMALLY INVASIVE MITRAL VALVE (MV) REPLACEMENT;  Surgeon: Rexene Alberts, MD;  Location: [facility];  Service: Open Heart Surgery;  Laterality: Right;   PACEMAKER LEAD REMOVAL  02/20/2016   TEE WITH CARDIOVERSION     TEE WITHOUT CARDIOVERSION N/A 08/22/2015   Procedure: TRANSESOPHAGEAL ECHOCARDIOGRAM (TEE);  Surgeon: Thayer Headings, MD;  Location: [facility];  Service: Cardiovascular;  Laterality: N/A;   TEE WITHOUT CARDIOVERSION N/A 10/31/2015   Procedure: TRANSESOPHAGEAL ECHOCARDIOGRAM (TEE);  Surgeon: Rexene Alberts, MD;  Location: [facility];  Service: Open Heart Surgery;  Laterality: N/A;   TUBAL LIGATION     VARICOSE VEIN SURGERY Right      Family History  Problem Relation Age of Onset   Hypertension Mother    Arrhythmia  Mother    Heart failure Mother    Arrhythmia Brother    Stroke Brother 21       cerebral hemorrhage, nonsmoker, no HTN   Prostate cancer Brother    Stroke Father        from an aneurysm   Stroke Maternal Aunt 83       cerebral hemorrhage   Liver cancer Maternal Grandmother    Atrial fibrillation Son    Celiac disease Son    Heart attack Neg Hx    Breast cancer Neg Hx      Social History   Socioeconomic History   Marital status: Widowed    Spouse name: Deceased   Number of children: 2   Years of education: 16   Highest education level: Not on file  Occupational History   Occupation: Retired  Tobacco Use   Smoking status: Never   Smokeless tobacco: Never  Vaping Use   Vaping Use: Never used  Substance and Sexual Activity   Alcohol use: No    Alcohol/week: 3.0 standard drinks    Types: 3 Standard drinks or equivalent per week   Drug use: No   Sexual activity: Not Currently  Other Topics Concern   Not on file  Social History Narrative   ** Merged History Encounter **       She is a widow.  Husband died from metastatic renal cell cancer. Originally from [place]. Previously lived in [place] from 1975-[place]. Moved to [place] in 2007. No mold exposure recently but did have it through a prior work exposure in 1993. Has a masters in    public health. No bird exposure.  [facility] (09/29/17): She has moved into a retirement community since last appointment. She reports she had testing at her new residence that was positive for mold. It has since been treated.    Social Determinants of Health   Financial Resource Strain: Not on file  Food Insecurity: Not on file  Transportation Needs: Not  on file  Physical Activity: Not on file  Stress: Not on file  Social Connections: Not on file  Intimate Partner Violence: Not on file     There were no vitals taken for this visit.  Physical Exam:  Well appearing NAD HEENT: Unremarkable Neck:  No JVD, no  thyromegally Lymphatics:  No adenopathy Back:  No CVA tenderness Lungs:  Clear with no wheezes HEART:  Regular rate rhythm, no murmurs, no rubs, no clicks Abd:  soft, positive bowel sounds, no organomegally, no rebound, no guarding Ext:  2 plus pulses, no edema, no cyanosis, no clubbing Skin:  No rashes no nodules Neuro:  CN II through XII intact, motor grossly intact  DEVICE  Normal device function.  See PaceArt for details.   Assess/Plan:  1. 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 We will recheck in several months. 2. CHB - she is asymptomatic.  3. HTN - her bp is well controlled. She is not symptomatic but if she were to develop any symptoms we would cut back her lopressor. 4. PAF - she is maintaining NSR. Her medtronic DDD PM is working normally.    Mikle Bosworth.D

## 2021-11-08 NOTE — Patient Instructions (Signed)
Medication Instructions:  Your physician recommends that you continue on your current medications as directed. Please refer to the Current Medication list given to you today.  Labwork: None ordered.  Testing/Procedures: None ordered.  Follow-Up: Your physician wants you to follow-up in: one year with Cristopher Peru, MD or one of the following Advanced Practice Providers on your designated Care Team:   Tommye Standard, [place] Legrand Como "Jonni Sanger" Chalmers Cater, [place]  Remote monitoring is used to monitor your Pacemaker from home. This monitoring reduces the number of office visits required to check your device to one time per year. It allows [place] to keep an eye on the functioning of your device to ensure it is working properly. You are scheduled for a device check from home on 11/27/2021. You may send your transmission at any time that day. If you have a wireless device, the transmission will be sent automatically. After your physician reviews your transmission, you will receive a postcard with your next transmission date.  Any Other Special Instructions Will Be Listed Below (If Applicable).  If you need a refill on your cardiac medications before your next appointment, please call your pharmacy.

## 2021-11-13 ENCOUNTER — Encounter: Payer: Self-pay | Admitting: Internal Medicine

## 2021-11-13 ENCOUNTER — Telehealth: Payer: Self-pay | Admitting: Internal Medicine

## 2021-11-13 ENCOUNTER — Other Ambulatory Visit: Payer: Self-pay

## 2021-11-13 ENCOUNTER — Ambulatory Visit (INDEPENDENT_AMBULATORY_CARE_PROVIDER_SITE_OTHER): Payer: Medicare Other | Admitting: Internal Medicine

## 2021-11-13 VITALS — BP 100/58 | HR 60 | Temp 95.9°F | Ht 64.0 in | Wt 146.2 lb

## 2021-11-13 DIAGNOSIS — N1831 Chronic kidney disease, stage 3a: Secondary | ICD-10-CM

## 2021-11-13 DIAGNOSIS — G40909 Epilepsy, unspecified, not intractable, without status epilepticus: Secondary | ICD-10-CM | POA: Diagnosis not present

## 2021-11-13 DIAGNOSIS — D6869 Other thrombophilia: Secondary | ICD-10-CM

## 2021-11-13 DIAGNOSIS — E559 Vitamin D deficiency, unspecified: Secondary | ICD-10-CM | POA: Diagnosis not present

## 2021-11-13 DIAGNOSIS — I5022 Chronic systolic (congestive) heart failure: Secondary | ICD-10-CM | POA: Diagnosis not present

## 2021-11-13 DIAGNOSIS — I6523 Occlusion and stenosis of bilateral carotid arteries: Secondary | ICD-10-CM | POA: Diagnosis not present

## 2021-11-13 DIAGNOSIS — I495 Sick sinus syndrome: Secondary | ICD-10-CM

## 2021-11-13 DIAGNOSIS — D696 Thrombocytopenia, unspecified: Secondary | ICD-10-CM

## 2021-11-13 DIAGNOSIS — I4819 Other persistent atrial fibrillation: Secondary | ICD-10-CM

## 2021-11-13 HISTORY — DX: Chronic kidney disease, stage 3a: N18.31

## 2021-11-13 NOTE — Telephone Encounter (Signed)
Lft pt vm to call ofc to sch US renal. thanks 

## 2021-11-13 NOTE — Assessment & Plan Note (Signed)
Stable , unaffected by statin suspension .statin resumed     Lab Results  Component Value Date   WBC 4.7 10/03/2021   HGB 10.8 (L) 10/03/2021   HCT 33.7 (L) 10/03/2021   MCV 94.4 10/03/2021   PLT 124 (L) 10/03/2021

## 2021-11-13 NOTE — Assessment & Plan Note (Addendum)
S/p pacemaker insertion in 2016

## 2021-11-13 NOTE — Progress Notes (Addendum)
Subjective:  Patient ID: [name] [name], female    DOB: 01-Apr-1941  Age: 80 y.o. MRN: [id number]  CC: The primary encounter diagnosis was Vitamin D deficiency. Diagnoses of Chronic kidney disease (CKD) stage G3a/A2, moderately decreased glomerular filtration rate (GFR) between 45-59 mL/min/1.73 square meter and albuminuria creatinine ratio between 30-299 mg/g (HCC), Chronic systolic congestive heart failure ([place]), Seizure disorder ([place]), Thrombocytopenia ([place]), Sinus node dysfunction ([place]), Atrial fibrillation, persistent ([place]), and Acquired thrombophilia ([place]) were also pertinent to this visit.  HPI [name] [name] presents for  Chief Complaint  Patient presents with   Follow-up    3 month follow up    This visit occurred during the SARS-CoV-2 public health emergency.  Safety protocols were in place, including screening questions prior to the visit, additional usage of staff PPE, and extensive cleaning of exam room while observing appropriate contact time as indicated for disinfecting solutions.   Osteoporosis treatment:  post femur fracture  5 months ago,  now using a cane instead of a walker.  Seeing Handy , orthopedic surgeon , and treatment with Evenity postponed for 2 months,  until the potential interaction with Breviact can be determined.  Seizure disorder: focal awareness type,  right  temporal lobe. Presents with left hand and foot numbness, remains fully conscious and interactive during episodes, but has been advised to abstain from driving  by Dr Manuella Ghazi until seizure free x 6 months.    Asymptomatic right inguinal hernia per [facility] exam Nov 2022   Decreased GFR:  noted to change about 6 months ago,  subtle change noted in July 2022.   She is not using NSAIDS.  She has been reviewing her GFR for the last several years. She is convinced the lamotrigine is the cause because of the temporal relationship , (which she started Dec 2019) but is not interested in stopping lamotrigine or in  seeing a nephrologist at this time.   I have offered to do the rudimentary workup (renal u/s, proteinuria assessment.  MM screen was done earlier this year).   Outpatient Medications Prior to Visit  Medication Sig Dispense Refill   amoxicillin (AMOXIL) 500 MG tablet Take 4 tablets 60 minutes prior to dental procedures 30 tablet 6   azelastine (ASTELIN) 0.1 % nasal spray TAKE 2 SPRAYS INTO BOTH NOSTRILS TWICE DAILY AS DIRECTED 30 mL 12   BRIVIACT 25 MG TABS tablet Take 25-50 mg by mouth See admin instructions. 25mg  in am , 50mg  in pm     butalbital-acetaminophen-caffeine (FIORICET) 50-325-40 MG tablet TAKE ONE TABLET BY MOUTH DAILY AS NEEDED FOR BACK PAIN OR MIGRAINE HEADACHE. MAX OF 3 TABLETS DAILY 90 tablet 3   Cholecalciferol (VITAMIN D) 2000 units CAPS Take 2,000 Units by mouth daily.     ELIQUIS 5 MG TABS tablet TAKE ONE TABLET TWICE DAILY 180 tablet 1   fluticasone (FLONASE) 50 MCG/ACT nasal spray USE 2 SPRAYS IN BOTH NOSTRILS DAILY 16 g 11   Folic Acid 0.8 MG CAPS Take 0.8 mg by mouth daily.     furosemide (LASIX) 40 MG tablet Take one tablet twice per week. Take an additional 0.5-1 tablet as needed for fluid retention. 30 tablet 11   hydroquinone 4 % cream Apply topically in the morning and at bedtime.      lamoTRIgine (LAMICTAL) 150 MG tablet Take 200 mg by mouth 2 (two) times daily.     losartan (COZAAR) 25 MG tablet Take 0.5 tablets (12.5 mg total) by mouth daily. 45 tablet  3   metoprolol succinate (TOPROL-XL) 25 MG 24 hr tablet TAKE 1 TABLET BY MOUTH DAILY 90 tablet 2   montelukast (SINGULAIR) 10 MG tablet TAKE ONE TABLET BY MOUTH AT BEDTIME 90 tablet 1   polyethylene glycol powder (GLYCOLAX/MIRALAX) 17 GM/SCOOP powder Take 1 Dose by mouth daily.     rosuvastatin (CRESTOR) 40 MG tablet TAKE ONE TABLET EVERY DAY 90 tablet 1   spironolactone (ALDACTONE) 25 MG tablet Take 0.5 tablets (12.5 mg total) by mouth every other day.     triamcinolone cream (KENALOG) 0.1 % Apply 1 application  topically 2 (two) times daily. 30 g 0   VENTOLIN HFA 108 (90 Base) MCG/ACT inhaler INHALE 2 PUFFS INTO THE LUNGS EVERY 6 HOURS AS NEEDED FOR WHEEZING OR SHORTNESS OF BREATH 18 g 1   vitamin B-12 (CYANOCOBALAMIN) 1000 MCG tablet Take 1,000 mcg by mouth daily.     No facility-administered medications prior to visit.    Review of Systems;  Patient denies headache, fevers, malaise, unintentional weight loss, skin rash, eye pain, sinus congestion and sinus pain, sore throat, dysphagia,  hemoptysis , cough, dyspnea, wheezing, chest pain, palpitations, orthopnea, edema, abdominal pain, nausea, melena, diarrhea, constipation, flank pain, dysuria, hematuria, urinary  Frequency, nocturia, numbness, tingling, seizures,  Focal weakness, Loss of consciousness,  Tremor, insomnia, depression, anxiety, and suicidal ideation.      Objective:  BP (!) 100/58 (BP Location: Left Arm, Patient Position: Sitting, Cuff Size: Normal)    Pulse 60    Temp (!) 95.9 F (35.5 C) (Temporal)    Ht 5\' 4"  (1.626 m)    Wt 146 lb 3.2 oz (66.3 kg)    SpO2 99%    BMI 25.10 kg/m   BP Readings from Last 3 Encounters:  11/13/21 (!) 100/58  11/08/21 94/68  10/03/21 106/70    Wt Readings from Last 3 Encounters:  11/13/21 146 lb 3.2 oz (66.3 kg)  11/08/21 146 lb (66.2 kg)  10/03/21 145 lb 3.2 oz (65.9 kg)    General appearance: alert, cooperative and appears stated age Ears: normal TM's and external ear canals both ears Throat: lips, mucosa, and tongue normal; teeth and gums normal Neck: no adenopathy, no carotid bruit, supple, symmetrical, trachea midline and thyroid not enlarged, symmetric, no tenderness/mass/nodules Back: symmetric, no curvature. ROM normal. No CVA tenderness. Lungs: clear to auscultation bilaterally Heart: regular rate and rhythm, S1, S2 normal, no murmur, click, rub or gallop Abdomen: soft, non-tender; bowel sounds normal; no masses,  no organomegaly Pulses: 2+ and symmetric Skin: Skin color,  texture, turgor normal. No rashes or lesions Lymph nodes: Cervical, supraclavicular, and axillary nodes normal.  Lab Results  Component Value Date   HGBA1C 5.5 02/11/2020   HGBA1C 5.7 05/21/2019   HGBA1C 5.3 04/07/2018    Lab Results  Component Value Date   CREATININE 1.00 10/03/2021   CREATININE 0.97 09/11/2021   CREATININE 0.85 06/23/2021    Lab Results  Component Value Date   WBC 4.7 10/03/2021   HGB 10.8 (L) 10/03/2021   HCT 33.7 (L) 10/03/2021   PLT 124 (L) 10/03/2021   GLUCOSE 91 10/03/2021   CHOL 143 05/21/2019   TRIG 60.0 05/21/2019   HDL 59.50 05/21/2019   LDLDIRECT 113.0 02/15/2016   LDLCALC 71 05/21/2019   ALT 13 10/03/2021   AST 20 10/03/2021   NA 136 10/03/2021   K 4.0 10/03/2021   CL 100 10/03/2021   CREATININE 1.00 10/03/2021   BUN 28 (H) 10/03/2021   CO2 28  10/03/2021   TSH 2.991 07/03/2021   INR 1.2 06/18/2021   HGBA1C 5.5 02/11/2020    MM 3D SCREEN BREAST BILATERAL  Result Date: 10/16/2021 CLINICAL DATA:  Screening. EXAM: DIGITAL SCREENING BILATERAL MAMMOGRAM WITH TOMOSYNTHESIS AND CAD TECHNIQUE: Bilateral screening digital craniocaudal and mediolateral oblique mammograms were obtained. Bilateral screening digital breast tomosynthesis was performed. The images were evaluated with computer-aided detection. COMPARISON:  Previous exam(s). ACR Breast Density Category b: There are scattered areas of fibroglandular density. FINDINGS: There are no findings suspicious for malignancy. IMPRESSION: No mammographic evidence of malignancy. A result letter of this screening mammogram will be mailed directly to the patient. RECOMMENDATION: Screening mammogram in one year. (Code:SM-B-01Y) BI-RADS CATEGORY  1: Negative. Electronically Signed   By: Marin Olp M.D.   On: 10/16/2021 12:04   Assessment & Plan:   Problem List Items Addressed This Visit     Thrombocytopenia ([place])    Stable , unaffected by statin suspension .statin resumed     Lab Results   Component Value Date   WBC 4.7 10/03/2021   HGB 10.8 (L) 10/03/2021   HCT 33.7 (L) 10/03/2021   MCV 94.4 10/03/2021   PLT 124 (L) 10/03/2021         Sinus node dysfunction (HCC)    S/p pacemaker insertion in 2016      Seizure disorder ([place])    Now managed with Breviact and lamictal. Episodes of parasthesias are often triggered by medication non adherence and use of triggering medications .  Currently controlled on lamictal and breviact       Chronic systolic congestive heart failure ([place])    She declines Farxiga trial due to potential side effects or urinary frequency      Chronic kidney disease (CKD) stage G3a/A2, moderately decreased glomerular filtration rate (GFR) between 45-59 mL/min/1.73 square meter and albuminuria creatinine ratio between 30-299 mg/g (HCC)    Checking renal US.  Urine microalb/cr ratio and PTH.  Screen for MM was done earlier this year during workup for anemia . She does not wasn't to see a nephrologist at this time .  She attributes the decline in GFR to lamictal.       Relevant Orders   Microalbumin / creatinine urine ratio   PTH, Intact and Calcium   VITAMIN D 25 Hydroxy (Vit-D Deficiency, Fractures)   US Renal   Atrial fibrillation, persistent (HCC)    currently rate controlled and has no signs of heart failure on exam.  Tolerating Eliquis for mitigation of embolic stroke risk       Acquired thrombophilia ([place])    Managed with use of Eliquis for embolic stroke risk mitigation due to  atrial fibrillation. Patient has no signs of bleeding and is advised to notify her specialists prior to any procedure that may required suspension of Eliquis       Other Visit Diagnoses     Vitamin D deficiency    -  Primary       I am having [name] [name] maintain her Vitamin D, triamcinolone cream, polyethylene glycol powder, lamoTRIgine, hydroquinone, amoxicillin, azelastine, vitamin S-28, Folic Acid, furosemide, losartan, spironolactone,  butalbital-acetaminophen-caffeine, Ventolin HFA, fluticasone, montelukast, Briviact, rosuvastatin, Eliquis, and metoprolol succinate.  No orders of the defined types were placed in this encounter.    I provided  40 minutes of  face-to-face time during this encounter reviewing patient's current problems and past surgeries, labs and imaging studies, providing counseling on the above mentioned problems , and coordination  of care .  Follow-up: Return in about 6 months (around 05/14/2022).   [name], MD

## 2021-11-13 NOTE — Patient Instructions (Signed)
°  I 'll do the workup for CKD that a nephrologist would do .   This involves an assessment for proteinuria and an imaging study of your [facility]  '

## 2021-11-13 NOTE — Assessment & Plan Note (Addendum)
Secondary to right temporal lobe focus, Now managed with Breviact and lamictal. Episodes of parasthesias are often triggered by medication non adherence and use of triggering medications .  Currently controlled on lamictal and breviact

## 2021-11-13 NOTE — Assessment & Plan Note (Signed)
Managed with use of Eliquis for embolic stroke risk mitigation due to  atrial fibrillation. Patient has no signs of bleeding and is advised to notify her specialists prior to any procedure that may required suspension of Eliquis

## 2021-11-13 NOTE — Assessment & Plan Note (Signed)
Checking renal US.  Urine microalb/cr ratio and PTH.  Screen for MM was done earlier this year during workup for anemia . She does not wasn't to see a nephrologist at this time .  She attributes the decline in GFR to lamictal.

## 2021-11-13 NOTE — Assessment & Plan Note (Signed)
currently rate controlled and has no signs of heart failure on exam.  Tolerating Eliquis for mitigation of embolic stroke risk

## 2021-11-13 NOTE — Assessment & Plan Note (Signed)
She declines [place] trial due to potential side effects or urinary frequency

## 2021-11-14 ENCOUNTER — Telehealth: Payer: Self-pay | Admitting: Internal Medicine

## 2021-11-14 ENCOUNTER — Encounter: Payer: Self-pay | Admitting: Internal Medicine

## 2021-11-14 LAB — MICROALBUMIN / CREATININE URINE RATIO
Creatinine,U: 97.1 mg/dL
Microalb Creat Ratio: 2.9 mg/g (ref 0.0–30.0)
Microalb, Ur: 2.8 mg/dL — ABNORMAL HIGH (ref 0.0–1.9)

## 2021-11-14 LAB — VITAMIN D 25 HYDROXY (VIT D DEFICIENCY, FRACTURES): VITD: 74.43 ng/mL (ref 30.00–100.00)

## 2021-11-14 NOTE — Telephone Encounter (Signed)
Patient wants a call back regarding starting Evenity for her osteoporosis. She stated she spoke to you in the hall.

## 2021-11-14 NOTE — Telephone Encounter (Signed)
Left message to call office

## 2021-11-14 NOTE — Telephone Encounter (Signed)
Patient returned Kathy's phone call. 

## 2021-11-15 LAB — PTH, INTACT AND CALCIUM
Calcium: 9.6 mg/dL (ref 8.6–10.4)
PTH: 28 pg/mL (ref 16–77)

## 2021-11-16 ENCOUNTER — Ambulatory Visit
Admission: RE | Admit: 2021-11-16 | Discharge: 2021-11-16 | Disposition: A | Discharge status: Home / Self Care | Payer: Medicare Other | Source: Ambulatory Visit | Attending: Internal Medicine | Admitting: Internal Medicine

## 2021-11-16 DIAGNOSIS — N1831 Chronic kidney disease, stage 3a: Secondary | ICD-10-CM | POA: Insufficient documentation

## 2021-11-27 ENCOUNTER — Ambulatory Visit (INDEPENDENT_AMBULATORY_CARE_PROVIDER_SITE_OTHER): Payer: Medicare Other

## 2021-11-27 DIAGNOSIS — I442 Atrioventricular block, complete: Secondary | ICD-10-CM | POA: Diagnosis not present

## 2021-11-27 LAB — CUP PACEART REMOTE DEVICE CHECK
Battery Impedance: 679 Ohm
Battery Remaining Longevity: 64 mo
Battery Voltage: 2.78 V
Brady Statistic AP VP Percent: 84 %
Brady Statistic AP VS Percent: 0 %
Brady Statistic AS VP Percent: 16 %
Brady Statistic AS VS Percent: 0 %
Date Time Interrogation Session: 20221227094953
Implantable Lead Implant Date: 20161205
Implantable Lead Implant Date: 20170321
Implantable Lead Location: 753859
Implantable Lead Location: 753860
Implantable Lead Model: 5076
Implantable Lead Model: 5076
Implantable Pulse Generator Implant Date: 20161205
Lead Channel Impedance Value: 407 Ohm
Lead Channel Impedance Value: 678 Ohm
Lead Channel Pacing Threshold Amplitude: 0.5 V
Lead Channel Pacing Threshold Amplitude: 0.75 V
Lead Channel Pacing Threshold Pulse Width: 0.4 ms
Lead Channel Pacing Threshold Pulse Width: 0.4 ms
Lead Channel Setting Pacing Amplitude: 2 V
Lead Channel Setting Pacing Amplitude: 2.5 V
Lead Channel Setting Pacing Pulse Width: 0.4 ms
Lead Channel Setting Sensing Sensitivity: 4 mV

## 2021-12-03 ENCOUNTER — Encounter: Payer: Self-pay | Admitting: Cardiovascular Disease

## 2021-12-05 ENCOUNTER — Encounter: Payer: Self-pay | Admitting: Internal Medicine

## 2021-12-05 ENCOUNTER — Ambulatory Visit (INDEPENDENT_AMBULATORY_CARE_PROVIDER_SITE_OTHER): Payer: Medicare Other | Admitting: Cardiovascular Disease

## 2021-12-05 ENCOUNTER — Encounter: Payer: Self-pay | Admitting: Cardiovascular Disease

## 2021-12-05 ENCOUNTER — Other Ambulatory Visit: Payer: Self-pay

## 2021-12-05 VITALS — BP 100/60 | HR 81 | Ht 64.0 in | Wt 146.5 lb

## 2021-12-05 DIAGNOSIS — I672 Cerebral atherosclerosis: Secondary | ICD-10-CM | POA: Diagnosis not present

## 2021-12-05 DIAGNOSIS — I6523 Occlusion and stenosis of bilateral carotid arteries: Secondary | ICD-10-CM | POA: Diagnosis not present

## 2021-12-05 DIAGNOSIS — I480 Paroxysmal atrial fibrillation: Secondary | ICD-10-CM

## 2021-12-05 DIAGNOSIS — I5022 Chronic systolic (congestive) heart failure: Secondary | ICD-10-CM

## 2021-12-05 DIAGNOSIS — I700 Atherosclerosis of aorta: Secondary | ICD-10-CM

## 2021-12-05 NOTE — Progress Notes (Addendum)
Cardiology Office Note  Date:  12/05/2021   ID:  [name] [name], [name] [name] 12/22/1940, MRN [id number]  PCP:  [name], MD   Chief Complaint  Patient presents with   Medication Problem    Patient would like to discuss a medication [facility]) that she will be starting for osteoporosis treatment. Medications reviewed by the patient verbally.     HPI:  [name] [name] is a 81 yo woman with past medical history of Supraventricular tachycardia, prior ablation Mitral valve prolapse - s/p mitral valve replacement  Paroxysmal Afib, on eliquis Cardiac cath 2016: no CAD MVR, maze 2017, dr. 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   Seizures in recovery Followed by Dr. Manuella Ghazi  Would like to start evenity for osteoporosis Side effects and possible cardiac complications reviewed with her  Using a cane, working to get off the cane  EKG personally reviewed by myself on todays visit Shows paced rhythm rate 81 bpm  Other past medical hx 11/22/20 echocardiogram with LVEF 35-40% (stable compared to 2017), LV global hypokinesis, indeterminate LV diastolic parameters, RV normal size and function, normal PASP, stable bioprosthetic mitral valve.  Prior cardiac imaging reviewed Echo 2018: ejection fraction was  in the range of 40% to 45%.  Mitral: bioprosthesis was present and functioning   normally  Cardiac cath 2016:  Patent coronary arteries without significant obstruction Severe mitral regurgitation by hemodynamic and angiographic assessment  PMH:   has a past  medical history of Acute on chronic diastolic (congestive) heart failure ([place]), Allergy, Anemia (2018), Anxiety, Anxiety associated with depression, Anxiety associated with depression, Arthritis, Atrial fibrillation, persistent (HCC), Bradycardia (post-op bradycardia, pacer dependent), Cataract, Chronic kidney disease (CKD) stage G3a/A2, moderately decreased glomerular filtration rate (GFR) between 45-59 mL/min/1.73 square meter and albuminuria creatinine ratio between 30-299 mg/g ([place]) (11/13/2021), Diverticulosis, Focal seizures ([place]), Heart murmur, Hemorrhoid, Hepatic cyst, History of asbestos exposure, Hyperlipidemia, IBS (irritable bowel syndrome), Idiopathic thrombocytopenic purpura (ITP) ([place]), Migraine, MVP (mitral valve prolapse), Nodule of right lung, Osteoporosis of forearm, RBBB, Restrictive lung disease, S/P Minimally invasive maze operation for atrial fibrillation (10/31/2015), 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  PSH:    Past Surgical History:  Procedure Laterality Date   BREAST EXCISIONAL BIOPSY Right Late 80s   Negative X2   CARDIAC CATHETERIZATION N/A 10/18/2015   Procedure: Right/Left Heart Cath and Coronary Angiography;  Surgeon: Sherren Mocha, MD;  Location: [place] [facility];  Service: Cardiovascular;  Laterality: N/A;   CARDIOVERSION N/A 08/22/2015   Procedure: CARDIOVERSION;  Surgeon: Thayer Headings, MD;  Location: [facility] ENDOSCOPY;  Service: Cardiovascular;  Laterality: N/A;   COLONOSCOPY  2003   EP IMPLANTABLE DEVICE N/A 11/06/2015   Procedure: Pacemaker Implant;  Surgeon: Evans Lance, MD;  Location: [place] [facility] LAB;  Service: Cardiovascular;  Laterality: N/A;   EP IMPLANTABLE DEVICE N/A 02/20/2016   Procedure: Lead Extraction;  Surgeon: Evans Lance, MD;  Location:  [place] IN[facility] LAB;  Service: Cardiovascular;  Laterality: N/A;   EYE SURGERY Right 2013   FEMUR IM NAIL Left 06/18/2021   Procedure: INTRAMEDULLARY (IM) NAIL FEMORAL, OPEN REDUCTION INTERNAL FIXATION FEMUR RIGHT LITTLE FINGER PIP CLOSED REDUCTION;  Surgeon: Altamese Chandler, MD;  Location: [facility];  Service: Orthopedics;  Laterality: Left;   FOOT SURGERY     MANDIBLE FRACTURE SURGERY  03-26-13   MINIMALLY INVASIVE MAZE PROCEDURE N/A 10/31/2015   Procedure: MINIMALLY INVASIVE MAZE PROCEDURE;  Surgeon: Rexene Alberts, MD;  Location: [facility];  Service: Open Heart Surgery;  Laterality: N/A;   MITRAL VALVE REPLACEMENT Right 10/31/2015   Procedure: MINIMALLY INVASIVE MITRAL VALVE (MV) REPLACEMENT;  Surgeon: Rexene Alberts, MD;  Location: [place];  Service: Open Heart Surgery;  Laterality: Right;   PACEMAKER LEAD REMOVAL  02/20/2016   TEE WITH CARDIOVERSION     TEE WITHOUT CARDIOVERSION N/A 08/22/2015   Procedure: TRANSESOPHAGEAL ECHOCARDIOGRAM (TEE);  Surgeon: Thayer Headings, MD;  Location: [facility];  Service: Cardiovascular;  Laterality: N/A;   TEE WITHOUT CARDIOVERSION N/A 10/31/2015   Procedure: TRANSESOPHAGEAL ECHOCARDIOGRAM (TEE);  Surgeon: Rexene Alberts, MD;  Location: [facility];  Service: Open Heart Surgery;  Laterality: N/A;   TUBAL LIGATION     VARICOSE VEIN SURGERY Right     Current Outpatient Medications  Medication Sig Dispense Refill   amoxicillin (AMOXIL) 500 MG tablet Take 4 tablets 60 minutes prior to dental procedures 30 tablet 6   BRIVIACT 25 MG TABS tablet Take 25-50 mg by mouth See admin instructions. 50 mg in am , 50mg  in pm     butalbital-acetaminophen-caffeine (FIORICET) 50-325-40 MG tablet TAKE ONE TABLET BY MOUTH DAILY AS NEEDED FOR BACK PAIN OR MIGRAINE HEADACHE. MAX OF 3 TABLETS DAILY 90 tablet 3   Cholecalciferol (VITAMIN D) 2000 units CAPS Take 2,000 Units by mouth daily.     ELIQUIS 5 MG TABS tablet TAKE ONE TABLET TWICE DAILY 180 tablet 1    fluticasone (FLONASE) 50 MCG/ACT nasal spray USE 2 SPRAYS IN BOTH NOSTRILS DAILY 16 g 11   Folic Acid 0.8 MG CAPS Take 0.8 mg by mouth daily.     furosemide (LASIX) 40 MG tablet Take one tablet twice per week. Take an additional 0.5-1 tablet as needed for fluid retention. 30 tablet 11   hydroquinone 4 % cream Apply topically in the morning and at bedtime.      lamoTRIgine (LAMICTAL) 200 MG tablet Take 150 mg in the am & 200 mg in the pm.     losartan (COZAAR) 25 MG tablet Take 0.5 tablets (12.5 mg total) by mouth daily. 45 tablet 3   metoprolol succinate (TOPROL-XL) 25 MG 24 hr tablet TAKE 1 TABLET BY MOUTH DAILY 90 tablet 2   montelukast (SINGULAIR) 10 MG tablet TAKE ONE TABLET BY MOUTH AT BEDTIME 90 tablet 1   polyethylene glycol powder (GLYCOLAX/MIRALAX) 17 GM/SCOOP powder Take 1 Dose by mouth daily.     rosuvastatin (CRESTOR) 40 MG tablet TAKE ONE TABLET EVERY DAY 90 tablet 1   spironolactone (ALDACTONE) 25 MG tablet Take 0.5 tablets (12.5 mg total) by mouth every other day.     VENTOLIN HFA 108 (90 Base) MCG/ACT inhaler INHALE 2 PUFFS INTO THE LUNGS EVERY 6 HOURS AS NEEDED FOR WHEEZING OR SHORTNESS OF BREATH 18 g 1   vitamin B-12 (CYANOCOBALAMIN) 1000 MCG tablet Take 1,000 mcg by mouth daily.     azelastine (ASTELIN) 0.1 % nasal spray TAKE 2 SPRAYS INTO  BOTH NOSTRILS TWICE DAILY AS DIRECTED (Patient not taking: Reported on 12/05/2021) 30 mL 12   lamoTRIgine (LAMICTAL) 150 MG tablet Take 200 mg by mouth 2 (two) times daily. (Patient not taking: Reported on 12/05/2021)     triamcinolone cream (KENALOG) 0.1 % Apply 1 application topically 2 (two) times daily. (Patient not taking: Reported on 12/05/2021) 30 g 0   No current facility-administered medications for this visit.     Allergies:   Acetaminophen-codeine, Codeine, Epinephrine, Hydrocodone, Hydromorphone, Oxycodone, Meloxicam, Codeine, Dextromethorphan, Diphen [diphenhydramine hcl], Diphenhydramine, Diphenhydramine hcl, Diphenylpyraline,  Doxycycline, Doxycycline, Hydrocodone, Hydromorphone, Meloxicam, Mobic [meloxicam], Nsaids, Nsaids, Oxycodone, Propofol, Propofol, and Tylenol [acetaminophen]   Social History:  The patient  reports that she has never smoked. She has never used smokeless tobacco. She reports that she does not drink alcohol and does not use drugs.   Family History:   family history includes Arrhythmia in her brother and mother; Atrial fibrillation in her son; Celiac disease in her son; Heart failure in her mother; Hypertension in her mother; Liver cancer in her maternal grandmother; Prostate cancer in her brother; Stroke in her father; Stroke (age of onset: 9) in her brother; Stroke (age of onset: 74) in her maternal aunt.    Review of Systems: Review of Systems  Constitutional: Negative.   HENT: Negative.    Respiratory: Negative.    Cardiovascular: Negative.   Gastrointestinal: Negative.   Musculoskeletal: Negative.   Neurological: Negative.   Psychiatric/Behavioral: Negative.    All other systems reviewed and are negative.  PHYSICAL EXAM: VS:  BP 100/60 (BP Location: Left Arm, Patient Position: Sitting, Cuff Size: Normal)    Pulse 81    Ht 5\' 4"  (1.626 m)    Wt 146 lb 8 oz (66.5 kg)    SpO2 98%    BMI 25.15 kg/m  , BMI Body mass index is 25.15 kg/m. Constitutional:  oriented to person, place, and time. No distress.  HENT:  Head: Grossly normal Eyes:  no discharge. No scleral icterus.  Neck: No JVD, no carotid bruits  Cardiovascular: Regular rate and rhythm, no murmurs appreciated Pulmonary/Chest: Clear to auscultation bilaterally, no wheezes or rails Abdominal: Soft.  no distension.  no tenderness.  Musculoskeletal: Normal range of motion Neurological:  normal muscle tone. Coordination normal. No atrophy Skin: Skin warm and dry Psychiatric: normal affect, pleasant  Recent Labs: 03/26/2021: BNP 267.8 06/23/2021: Magnesium 1.8 07/03/2021: TSH 2.991 10/03/2021: ALT 13; BUN 28; Creatinine, Ser 1.00;  Hemoglobin 10.8; Platelets 124; Potassium 4.0; Sodium 136    Lipid Panel Lab Results  Component Value Date   CHOL 143 05/21/2019   HDL 59.50 05/21/2019   LDLCALC 71 05/21/2019   TRIG 60.0 05/21/2019      Wt Readings from Last 3 Encounters:  12/05/21 146 lb 8 oz (66.5 kg)  11/13/21 146 lb 3.2 oz (66.3 kg)  11/08/21 146 lb (66.2 kg)     ASSESSMENT AND PLAN:  Problem List Items Addressed This Visit       Cardiology Problems   Thoracic aorta atherosclerosis (HCC) (Chronic)   Chronic systolic congestive heart failure (HCC)   PAF (paroxysmal atrial fibrillation) ([place]) - Primary   Carotid stenosis   Intracranial atherosclerosis  PAF: Tolerating metoprolol succinate 25 daily On anticoagulation stable  SSS, pacer Followed by Dr. Lovena Le  SVT Prior ablation, Continue metoprolol  Seizures Followed by neurology, Exacerbated by anesthesia  Cardiomyopathy Did not tolerate Entresto Continue low-dose losartan spironolactone metoprolol We have previously discussed adding Jardiance or Farxiga, does  not want to change meds  Hyperlipidemia Cholesterol is at goal on the current lipid regimen. No changes to the medications were made.  MVR, prior history of prolapse Stable on echo 2021 Stable axam today  Osteoporosis Discussed risk and benefit of Evenity She has had no recent MI or CVA.  No strong contraindication from the limited literature on the medication available to [place].  She would like to proceed with the medication   Total encounter time more than 35 minutes  Greater than 50% was spent in counseling and coordination of care with the patient    Signed, Esmond Plants, M.D., Ph.D. [place], [place]

## 2021-12-05 NOTE — Patient Instructions (Signed)
Medication Instructions:  No changes  If you need a refill on your cardiac medications before your next appointment, please call your pharmacy.   Lab work: No new labs needed  Testing/Procedures: No new testing needed  Follow-Up: At [facility], you and your health needs are our priority.  As part of our continuing mission to provide you with exceptional heart care, we have created designated Provider Care Teams.  These Care Teams include your primary Cardiologist (physician) and Advanced Practice Providers (APPs -  Physician Assistants and Nurse Practitioners) who all work together to provide you with the care you need, when you need it.  You will need a follow up appointment in 12 months  Providers on your designated Care Team:   Christopher Berge, NP Ryan Dunn, PA-C Cadence Furth, PA-C  COVID-19 Vaccine Information can be found at: [email or web address] For questions related to vaccine distribution or appointments, please email [email or web address] or call [phone].   

## 2021-12-06 NOTE — Progress Notes (Signed)
Remote pacemaker transmission.   

## 2022-01-29 ENCOUNTER — Other Ambulatory Visit: Payer: Medicare Other

## 2022-01-29 ENCOUNTER — Ambulatory Visit: Payer: Medicare Other | Admitting: Oncology

## 2022-02-05 ENCOUNTER — Encounter: Payer: Self-pay | Admitting: Internal Medicine

## 2022-02-10 ENCOUNTER — Other Ambulatory Visit: Payer: Self-pay | Admitting: Internal Medicine

## 2022-02-11 ENCOUNTER — Other Ambulatory Visit: Payer: Self-pay | Admitting: Internal Medicine

## 2022-02-25 ENCOUNTER — Ambulatory Visit: Payer: Medicare Other | Admitting: Internal Medicine

## 2022-02-26 ENCOUNTER — Ambulatory Visit (INDEPENDENT_AMBULATORY_CARE_PROVIDER_SITE_OTHER): Payer: Medicare Other

## 2022-02-26 DIAGNOSIS — I442 Atrioventricular block, complete: Secondary | ICD-10-CM | POA: Diagnosis not present

## 2022-02-27 LAB — CUP PACEART REMOTE DEVICE CHECK
Battery Impedance: 808 Ohm
Battery Remaining Longevity: 59 mo
Battery Voltage: 2.78 V
Brady Statistic AP VP Percent: 84 %
Brady Statistic AP VS Percent: 0 %
Brady Statistic AS VP Percent: 16 %
Brady Statistic AS VS Percent: 0 %
Date Time Interrogation Session: 20230329085900
Implantable Lead Implant Date: 20161205
Implantable Lead Implant Date: 20170321
Implantable Lead Location: 753859
Implantable Lead Location: 753860
Implantable Lead Model: 5076
Implantable Lead Model: 5076
Implantable Pulse Generator Implant Date: 20161205
Lead Channel Impedance Value: 411 Ohm
Lead Channel Impedance Value: 709 Ohm
Lead Channel Pacing Threshold Amplitude: 0.5 V
Lead Channel Pacing Threshold Amplitude: 0.625 V
Lead Channel Pacing Threshold Pulse Width: 0.4 ms
Lead Channel Pacing Threshold Pulse Width: 0.4 ms
Lead Channel Setting Pacing Amplitude: 2 V
Lead Channel Setting Pacing Amplitude: 2.5 V
Lead Channel Setting Pacing Pulse Width: 0.4 ms
Lead Channel Setting Sensing Sensitivity: 4 mV

## 2022-03-05 ENCOUNTER — Other Ambulatory Visit: Payer: Self-pay | Admitting: Internal Medicine

## 2022-03-05 ENCOUNTER — Other Ambulatory Visit: Payer: Self-pay | Admitting: Cardiovascular Disease

## 2022-03-05 DIAGNOSIS — I4819 Other persistent atrial fibrillation: Secondary | ICD-10-CM

## 2022-03-05 NOTE — Telephone Encounter (Signed)
Eliquis '5mg'$  refill request received. Patient is 81 years old, weight-66.5kg, Crea-1.00 on 10/03/2021, Diagnosis-Afib, and last seen by Dr. Rockey Situ on 12/05/2021. Dose is appropriate based on dosing criteria. Will send in refill to requested pharmacy.   ?

## 2022-03-05 NOTE — Telephone Encounter (Signed)
Refill request

## 2022-03-07 ENCOUNTER — Ambulatory Visit (INDEPENDENT_AMBULATORY_CARE_PROVIDER_SITE_OTHER): Payer: Medicare Other | Admitting: Internal Medicine

## 2022-03-07 ENCOUNTER — Encounter: Payer: Self-pay | Admitting: Internal Medicine

## 2022-03-07 VITALS — BP 98/66 | HR 76 | Temp 97.9°F | Ht 64.0 in | Wt 145.0 lb

## 2022-03-07 DIAGNOSIS — R29898 Other symptoms and signs involving the musculoskeletal system: Secondary | ICD-10-CM | POA: Diagnosis not present

## 2022-03-07 DIAGNOSIS — W19XXXA Unspecified fall, initial encounter: Secondary | ICD-10-CM

## 2022-03-07 DIAGNOSIS — M79604 Pain in right leg: Secondary | ICD-10-CM

## 2022-03-07 DIAGNOSIS — M5416 Radiculopathy, lumbar region: Secondary | ICD-10-CM

## 2022-03-07 DIAGNOSIS — M5136 Other intervertebral disc degeneration, lumbar region: Secondary | ICD-10-CM | POA: Diagnosis not present

## 2022-03-07 DIAGNOSIS — M479 Spondylosis, unspecified: Secondary | ICD-10-CM

## 2022-03-07 DIAGNOSIS — Z9181 History of falling: Secondary | ICD-10-CM

## 2022-03-07 MED ORDER — PREDNISONE 10 MG PO TABS: 10.0000 mg | ORAL_TABLET | Freq: Every day | ORAL | 0 refills | Status: DC

## 2022-03-07 NOTE — Progress Notes (Signed)
Chief Complaint  ?Patient presents with  ? Leg Pain  ? Fall  ? ?Acute visit after fall 03/04/22 at ortho office Dr. Marcelino Scot  she had Xrays with ortho 03/06/22 and no new fractures ?1. C/o pain from right waist/hip/buttocks to right lower leg and weakness in this leg after fall and h/o broken right femur. 06/2021 Thursday night prior to visit the pain worsened. Beginning of 03/2022 she had right hand pain then right leg upper and lower pain and right foot pain she thought initially it was due eating gluten 02/16/22 and work up 02/20/22 and entire body was in pain. She reports Monday prior to visit her right leg gave out and she saw Dr. Marcelino Scot (trauma ortho yesterday) and Rx for PT at [facility] where she lives. Pain is 5/10 today in right leg. She is in so much pain she cant stand ?Due to pain she has trouble sleeping and took her 5 hours to fall asleep due to pain.  She reports its also hard for her to stand  ?Of note H/o osteoporosis on Evenity she has h/o moderate spondylosis DDD ?She is worried about pain triggering her h/o seizures has a lot of allergies to medications and already has chronic pain due to neuropathy since 2003 established with neurology  ? ? ?Review of Systems  ?Constitutional:  Negative for weight loss.  ?HENT:  Negative for hearing loss.   ?Eyes:  Negative for blurred vision.  ?Respiratory:  Negative for shortness of breath.   ?Cardiovascular:  Negative for chest pain.  ?Gastrointestinal:  Negative for abdominal pain and blood in stool.  ?Genitourinary:  Negative for dysuria.  ?Musculoskeletal:  Positive for back pain, falls and joint pain.  ?Skin:  Negative for rash.  ?Neurological:  Negative for headaches.  ?Psychiatric/Behavioral:  Negative for depression. The patient has insomnia.   ?Past Medical History:  ?Diagnosis Date  ? Acute on chronic diastolic (congestive) heart failure ([place])   ? Allergy   ? See list  ? Anemia 2018  ? Anxiety   ? Occasionally take Xanax for sleep  ? Anxiety associated with  depression   ? Prn alprazolam   ? Anxiety associated with depression   ? Arthritis   ? Hands, Back  ? Atrial fibrillation, persistent ([place])   ? DCCV 08/22/2015  ? Bradycardia post-op bradycardia, pacer dependent  ? 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 Lovena Le  ? Cataract   ? Left eye  ? Chronic kidney disease (CKD) stage G3a/A2, moderately decreased glomerular filtration rate (GFR) between 45-59 mL/min/1.73 square meter and albuminuria creatinine ratio between 30-299 mg/g ([place]) 11/13/2021  ? Diverticulosis   ? Focal seizures ([place])   ? Heart murmur   ? Hemorrhoid   ? Hepatic cyst   ? innumerable  ? History of asbestos exposure   ? Hyperlipidemia   ? IBS (irritable bowel syndrome)   ? 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 Migraine   ? MVP (mitral valve prolapse)   ? Nodule of right lung   ? Osteoporosis of forearm   ? RBBB   ? Restrictive lung disease   ? Mild on PFT & likely cardiac in etiology   ? S/P Minimally invasive maze operation for atrial fibrillation 10/31/2015  ? Complete bilateral atrial lesion set using cryothermy and bipolar radiofrequency ablation with clipping of LA appendage via right mini thoracotomy approach  ? S/P minimally invasive mitral valve replacement with bioprosthetic valve 10/31/2015  ? 33 mm [facility] Mitral bovine bioprosthetic tissue valve  placed via right mini thoracotomy approach  ? Seizures ([place])   ? 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 Manuella Ghazi  ? Severe mitral regurgitation   ? Skin cancer, basal cell 1991  ? 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 SVT (supraventricular tachycardia) ([place])   ? Thoracic aorta atherosclerosis ([place])   ? TIA (transient ischemic attack)   ? ?Past Surgical History:  ?Procedure Laterality Date  ? BREAST EXCISIONAL BIOPSY Right Late 80s  ? Negative X2  ? CARDIAC CATHETERIZATION N/A 10/18/2015  ? Procedure: Right/Left Heart Cath and Coronary Angiography;  Surgeon: Sherren Mocha, MD;  Location: [place] CV LAB;  Service: Cardiovascular;  Laterality: N/A;  ?  CARDIOVERSION N/A 08/22/2015  ? Procedure: CARDIOVERSION;  Surgeon: Thayer Headings, MD;  Location: [facility] [place];  Service: Cardiovascular;  Laterality: N/A;  ? COLONOSCOPY  2003  ? EP IMPLANTABLE DEVICE N/A 11/06/2015  ? Procedure: Pacemaker Implant;  Surgeon: Evans Lance, MD;  Location: [place] [facility] LAB;  Service: Cardiovascular;  Laterality: N/A;  ? EP IMPLANTABLE DEVICE N/A 02/20/2016  ? Procedure: Lead Extraction;  Surgeon: Evans Lance, MD;  Location: [place] [facility] LAB;  Service: Cardiovascular;  Laterality: N/A;  ? EYE SURGERY Right 2013  ? FEMUR IM NAIL Left 06/18/2021  ? Procedure: INTRAMEDULLARY (IM) NAIL FEMORAL, OPEN REDUCTION INTERNAL FIXATION FEMUR RIGHT LITTLE FINGER PIP CLOSED REDUCTION;  Surgeon: Altamese Potter, MD;  Location: [facility];  Service: Orthopedics;  Laterality: Left;  ? FOOT SURGERY    ? ~2007 right foot bunion  ? MANDIBLE FRACTURE SURGERY  03/26/2013  ? MINIMALLY INVASIVE MAZE PROCEDURE N/A 10/31/2015  ? Procedure: MINIMALLY INVASIVE MAZE PROCEDURE;  Surgeon: Rexene Alberts, MD;  Location: [facility];  Service: Open Heart Surgery;  Laterality: N/A;  ? MITRAL VALVE REPLACEMENT Right 10/31/2015  ? Procedure: MINIMALLY INVASIVE MITRAL VALVE (MV) REPLACEMENT;  Surgeon: Rexene Alberts, MD;  Location: [facility];  Service: Open Heart Surgery;  Laterality: Right;  ? PACEMAKER LEAD REMOVAL  02/20/2016  ? TEE WITH CARDIOVERSION    ? TEE WITHOUT CARDIOVERSION N/A 08/22/2015  ? Procedure: TRANSESOPHAGEAL ECHOCARDIOGRAM (TEE);  Surgeon: Thayer Headings, MD;  Location: [facility];  Service: Cardiovascular;  Laterality: N/A;  ? TEE WITHOUT CARDIOVERSION N/A 10/31/2015  ? Procedure: TRANSESOPHAGEAL ECHOCARDIOGRAM (TEE);  Surgeon: Rexene Alberts, MD;  Location: [place];  Service: Open Heart Surgery;  Laterality: N/A;  ? TUBAL LIGATION    ? VARICOSE VEIN SURGERY Right   ? ?Family History  ?Problem Relation Age of Onset  ? Hypertension Mother   ? Arrhythmia Mother   ? Heart failure Mother   ? Arrhythmia  Brother   ? Stroke Brother 108  ?     cerebral hemorrhage, nonsmoker, no HTN  ? Prostate cancer Brother   ? Stroke Father   ?     from an aneurysm  ? Stroke Maternal Aunt 83  ?     cerebral hemorrhage  ? Liver cancer Maternal Grandmother   ? Atrial fibrillation Son   ? Celiac disease Son   ? Heart attack Neg Hx   ? Breast cancer Neg Hx   ? ?Social History  ? ?Socioeconomic History  ? Marital status: Widowed  ?  Spouse name: Deceased  ? Number of children: 2  ? Years of education: 53  ? Highest education level: Not on file  ?Occupational History  ? Occupation: Retired  ?Tobacco Use  ? Smoking status: Never  ? Smokeless tobacco: Never  ?Vaping  Use  ? Vaping Use: Never used  ?Substance and Sexual Activity  ? Alcohol use: No  ?  Alcohol/week: 3.0 standard drinks  ?  Types: 3 Standard drinks or equivalent per week  ? Drug use: No  ? Sexual activity: Not Currently  ?Other Topics Concern  ? Not on file  ?Social History Narrative  ? ** Merged History Encounter **  ?    ? She is a widow.  Husband died from metastatic renal cell cancer. Originally from [place]. Previously lived in [place] from 1975[place]. Moved to [place] in 2007. No mold exposure recently but did have it through a prior work exposure in 1993. Has a masters in   ? public health. No bird exposure. ? ?[facility] (09/29/17): ?She has moved into a retirement community since last appointment. She reports she had testing at her new residence that was positive for mold. It has since been treated.   ? ?Social Determinants of Health  ? ?Financial Resource Strain: Not on file  ?Food Insecurity: Not on file  ?Transportation Needs: Not on file  ?Physical Activity: Not on file  ?Stress: Not on file  ?Social Connections: Not on file  ?Intimate Partner Violence: Not on file  ? ?Current Meds  ?Medication Sig  ? Romosozumab-aqqg ([facility] [place]) Inject into the skin.  ? [DISCONTINUED] predniSONE (DELTASONE) 10 MG tablet Take 1 tablet (10 mg total) by mouth daily with breakfast.  X 7-10 days  ? ?Allergies  ?Allergen Reactions  ? Acetaminophen-Codeine Other (See Comments)  ? Codeine Nausea And Vomiting and Other (See Comments)  ?  migraine  ? Epinephrine Palpitations  ? Hydrocodone Nausea An

## 2022-03-07 NOTE — Patient Instructions (Addendum)
Can try aspercream with lidocaine or voltaren gel  ? ?Take prednisone 10 mg daily in am for pain for 7-10 days ? ?Let me know if you need something stronger for pain I.e demerol, fentanyl patch will be temporary for pain relief  ? ?Ct scan low back  ?[facility] outpatient imaging center ?Address: [address] [place], [place] [address] ?Phone: [phone] ? ?Rec physical therapy and occupational therapy at [facility] ? ?It is important to avoid accidents which may result in broken bones.  Here are a few ideas on how to make your home safer so you will be less likely to trip or fall. ? ?Use nonskid mats or non slip strips in your shower or tub, on your bathroom floor and around sinks.  If you know that you have spilled water, wipe it up! ?In the bathroom, it is important to have properly installed grab bars on the walls or on the edge of the tub.  Towel racks are NOT strong enough for you to hold onto or to pull on for support. ?Stairs and hallways should have enough light.  Add lamps or night lights if you need ore light. ?It is good to have handrails on both sides of the stairs if possible.  Always fix broken handrails right away. ?It is important to see the edges of steps.  Paint the edges of outdoor steps white so you can see them better.  Put colored tape on the edge of inside steps. ?Throw-rugs are dangerous because they can slide.  Removing the rugs is the best idea, but if they must stay, add adhesive carpet tape to prevent slipping. ?Do not keep things on stairs or in the halls.  Remove small furniture that blocks the halls as it may cause you to trip.  Keep telephone and electrical cords out of the way where you walk. ?Always were sturdy, rubber-soled shoes for good support.  Never wear just socks, especially on the stairs.  Socks may cause you to slip or fall.  Do not wear full-length housecoats as you can easily trip on the bottom.  ?Place the things you use the most on the shelves that are the  easiest to reach.  If you use a stepstool, make sure it is in good condition.  If you feel unsteady, DO NOT climb, ask for help. ?If a health professional advises you to use a cane or walker, do not be ashamed.  These items can keep you from falling and breaking your bones. ? ?Fall Prevention in the Home, Adult ?Falls can cause injuries and affect people of all ages. There are many simple things that you can do to make your home safe and to help prevent falls. Ask for help when making these changes, if needed. ?What actions can I take to prevent falls? ?General instructions ?Use good lighting in all rooms. Replace any light bulbs that burn out, turn on lights if it is dark, and use night-lights. ?Place frequently used items in easy-to-reach places. Lower the shelves around your home if necessary. ?Set up furniture so that there are clear paths around it. Avoid moving your furniture around. ?Remove throw rugs and other tripping hazards from the floor. ?Avoid walking on wet floors. ?Fix any uneven floor surfaces. ?Add color or contrast paint or tape to grab bars and handrails in your home. Place contrasting color strips on the first and last steps of staircases. ?When you use a stepladder, make sure that it is completely opened and  that the sides and supports are firmly locked. Have someone hold the ladder while you are using it. Do not climb a closed stepladder. ?Know where your pets are when moving through your home. ?What can I do in the bathroom? ?  ?Keep the floor dry. Immediately clean up any water that is on the floor. ?Remove soap buildup in the tub or shower regularly. ?Use nonskid mats or decals on the floor of the tub or shower. ?Attach bath mats securely with double-sided, nonslip rug tape. ?If you need to sit down while you are in the shower, use a plastic, nonslip stool. ?Install grab bars by the toilet and in the tub and shower. Do not use towel bars as grab bars. ?What can I do in the bedroom? ?Make  sure that a bedside light is easy to reach. ?Do not use oversized bedding that reaches the floor. ?Have a firm chair that has side arms to use for getting dressed. ?What can I do in the kitchen? ?Clean up any spills right away. ?If you need to reach for something above you, use a sturdy step stool that has a grab bar. ?Keep electrical cables out of the way. ?Do not use floor polish or wax that makes floors slippery. If you must use wax, make sure that it is non-skid floor wax. ?What can I do with my stairs? ?Do not leave any items on the stairs. ?Make sure that you have a light switch at the top and the bottom of the stairs. Have them installed if you do not have them. ?Make sure that there are handrails on both sides of the stairs. Fix handrails that are broken or loose. Make sure that handrails are as long as the staircases. ?Install non-slip stair treads on all stairs in your home. ?Avoid having throw rugs at the top or bottom of stairs, or secure the rugs with carpet tape to prevent them from moving. ?Choose a carpet design that does not hide the edge of steps on the stairs. ?Check any carpeting to make sure that it is firmly attached to the stairs. Fix any carpet that is loose or worn. ?What can I do on the outside of my home? ?Use bright outdoor lighting. ?Regularly repair the edges of walkways and driveways and fix any cracks. ?Remove high doorway thresholds. ?Trim any shrubbery on the main path into your home. ?Regularly check that handrails are securely fastened and in good repair. Both sides of all steps should have handrails. ?Install guardrails along the edges of any raised decks or porches. ?Clear walkways of debris and clutter, including tools and rocks. ?Have leaves, snow, and ice cleared regularly. ?Use sand or salt on walkways during winter months. ?In the garage, clean up any spills right away, including grease or oil spills. ?What other actions can I take? ?Wear closed-toe shoes that fit well and  support your feet. Wear shoes that have rubber soles or low heels. ?Use mobility aids as needed, such as canes, walkers, scooters, and crutches. ?Review your medicines with your health care provider. Some medicines can cause dizziness or changes in blood pressure, which increase your risk of falling. ?Talk with your health care provider about other ways that you can decrease your risk of falls. This may include working with a physical therapist or trainer to improve your strength, balance, and endurance. ?Where to find more information ?Centers for Disease Control and Prevention, STEADI: http://www.wolf.info/ ?[employer] on Aging: http://kim-miller.com/ ?Contact a health care provider if: ?You are  afraid of falling at home. ?You feel weak, drowsy, or dizzy at home. ?You fall at home. ?Summary ?There are many simple things that you can do to make your home safe and to help prevent falls. ?Ways to make your home safe include removing tripping hazards and installing grab bars in the bathroom. ?Ask for help when making these changes in your home. ?This information is not intended to replace advice given to you by your health care provider. Make sure you discuss any questions you have with your health care provider. ?Document Revised: 06/21/2020 Document Reviewed: 06/21/2020 ?Elsevier Patient Education ? [place]. ? ?

## 2022-03-11 NOTE — Progress Notes (Signed)
Remote pacemaker transmission.   

## 2022-03-15 ENCOUNTER — Other Ambulatory Visit: Payer: Self-pay | Admitting: Family

## 2022-03-15 ENCOUNTER — Encounter: Payer: Self-pay | Admitting: Internal Medicine

## 2022-03-18 NOTE — Telephone Encounter (Signed)
Pt called about medication being denied in mychart message ?

## 2022-03-19 MED ORDER — BUTALBITAL-APAP-CAFFEINE 50-325-40 MG PO TABS: ORAL_TABLET | ORAL | 0 refills | Status: DC

## 2022-03-31 ENCOUNTER — Encounter: Payer: Self-pay | Admitting: Internal Medicine

## 2022-03-31 DIAGNOSIS — M479 Spondylosis, unspecified: Secondary | ICD-10-CM | POA: Insufficient documentation

## 2022-03-31 DIAGNOSIS — M5136 Other intervertebral disc degeneration, lumbar region: Secondary | ICD-10-CM | POA: Insufficient documentation

## 2022-03-31 DIAGNOSIS — M51369 Other intervertebral disc degeneration, lumbar region without mention of lumbar back pain or lower extremity pain: Secondary | ICD-10-CM | POA: Insufficient documentation

## 2022-03-31 DIAGNOSIS — Z9181 History of falling: Secondary | ICD-10-CM | POA: Insufficient documentation

## 2022-04-02 ENCOUNTER — Inpatient Hospital Stay (HOSPITAL_BASED_OUTPATIENT_CLINIC_OR_DEPARTMENT_OTHER): Payer: Medicare Other | Admitting: Nurse Practitioner

## 2022-04-02 ENCOUNTER — Inpatient Hospital Stay: Payer: Medicare Other | Attending: Nurse Practitioner

## 2022-04-02 VITALS — BP 100/65 | HR 74 | Resp 16 | Ht 64.0 in | Wt 145.9 lb

## 2022-04-02 DIAGNOSIS — Z7901 Long term (current) use of anticoagulants: Secondary | ICD-10-CM | POA: Insufficient documentation

## 2022-04-02 DIAGNOSIS — D72820 Lymphocytosis (symptomatic): Secondary | ICD-10-CM | POA: Diagnosis not present

## 2022-04-02 DIAGNOSIS — D696 Thrombocytopenia, unspecified: Secondary | ICD-10-CM | POA: Diagnosis not present

## 2022-04-02 DIAGNOSIS — Z79899 Other long term (current) drug therapy: Secondary | ICD-10-CM | POA: Insufficient documentation

## 2022-04-02 DIAGNOSIS — D649 Anemia, unspecified: Secondary | ICD-10-CM | POA: Diagnosis present

## 2022-04-02 DIAGNOSIS — D638 Anemia in other chronic diseases classified elsewhere: Secondary | ICD-10-CM

## 2022-04-02 DIAGNOSIS — G8929 Other chronic pain: Secondary | ICD-10-CM | POA: Insufficient documentation

## 2022-04-02 DIAGNOSIS — M549 Dorsalgia, unspecified: Secondary | ICD-10-CM | POA: Diagnosis not present

## 2022-04-02 LAB — TECHNOLOGIST SMEAR REVIEW
Plt Morphology: NORMAL
RBC MORPHOLOGY: NORMAL
WBC MORPHOLOGY: NORMAL

## 2022-04-02 LAB — CBC WITH DIFFERENTIAL/PLATELET
Abs Immature Granulocytes: 0.02 10*3/uL (ref 0.00–0.07)
Basophils Absolute: 0 10*3/uL (ref 0.0–0.1)
Basophils Relative: 1 %
Eosinophils Absolute: 0.1 10*3/uL (ref 0.0–0.5)
Eosinophils Relative: 2 %
HCT: 32.9 % — ABNORMAL LOW (ref 36.0–46.0)
Hemoglobin: 11 g/dL — ABNORMAL LOW (ref 12.0–15.0)
Immature Granulocytes: 0 %
Lymphocytes Relative: 43 %
Lymphs Abs: 2.1 10*3/uL (ref 0.7–4.0)
MCH: 31.2 pg (ref 26.0–34.0)
MCHC: 33.4 g/dL (ref 30.0–36.0)
MCV: 93.2 fL (ref 80.0–100.0)
Monocytes Absolute: 0.4 10*3/uL (ref 0.1–1.0)
Monocytes Relative: 7 %
Neutro Abs: 2.2 10*3/uL (ref 1.7–7.7)
Neutrophils Relative %: 47 %
Platelets: 124 10*3/uL — ABNORMAL LOW (ref 150–400)
RBC: 3.53 MIL/uL — ABNORMAL LOW (ref 3.87–5.11)
RDW: 14.4 % (ref 11.5–15.5)
WBC: 4.9 10*3/uL (ref 4.0–10.5)
nRBC: 0 % (ref 0.0–0.2)

## 2022-04-02 LAB — COMPREHENSIVE METABOLIC PANEL
ALT: 13 U/L (ref 0–44)
AST: 21 U/L (ref 15–41)
Albumin: 4.2 g/dL (ref 3.5–5.0)
Alkaline Phosphatase: 118 U/L (ref 38–126)
Anion gap: 11 (ref 5–15)
BUN: 33 mg/dL — ABNORMAL HIGH (ref 8–23)
CO2: 26 mmol/L (ref 22–32)
Calcium: 9.3 mg/dL (ref 8.9–10.3)
Chloride: 97 mmol/L — ABNORMAL LOW (ref 98–111)
Creatinine, Ser: 1.09 mg/dL — ABNORMAL HIGH (ref 0.44–1.00)
GFR, Estimated: 51 mL/min — ABNORMAL LOW (ref >60)
Glucose, Bld: 93 mg/dL (ref 70–99)
Potassium: 3.7 mmol/L (ref 3.5–5.1)
Sodium: 134 mmol/L — ABNORMAL LOW (ref 135–145)
Total Bilirubin: 0.7 mg/dL (ref 0.3–1.2)
Total Protein: 6.4 g/dL — ABNORMAL LOW (ref 6.5–8.1)

## 2022-04-02 LAB — LACTATE DEHYDROGENASE: LDH: 207 U/L — ABNORMAL HIGH (ref 98–192)

## 2022-04-02 NOTE — Progress Notes (Signed)
?Hematology/Oncology Progress Note ?[place] ?[phone] [phone] ? ? ?Patient Care Team: ?[name], MD as PCP - General (Internal Medicine) ?Evans Lance, MD as PCP - Electrophysiology (Cardiology) ?Minna Merritts, MD as PCP - Cardiology (Cardiology) ?Robert Bellow, MD (General Surgery) ?[name], MD (Internal Medicine) ?[name], MD (Internal Medicine) ? ?REFERRING PROVIDER: ?[name], MD  ? ?CHIEF COMPLAINTS/REASON FOR VISIT:  ?Follow up for anemia and thrombocytopenia.  ? ?HISTORY OF PRESENTING ILLNESS:  ?[name] [name] is a  81 y.o.  female with PMH listed below was seen in consultation at the request of  [name], MD  for evaluation of anemia and thrombocytopenia. ? ?06/17/2021- 06/23/2021 patient was hospitalized due to left displaced femoral fracture and dislocation of at the fifth PIP. She was evaluated by orthopedic surgery and underwent ORIF on 06/18/2021.  Finger dislocation was also reduced in the OR. ?Patient has chronic anemia with hemoglobin around 11 before her admission.  ?Hemoglobin dropped to 7.4 dueing her hospitalization s/p PRBC transfusion. Hemoglobin improved 8 on 06/22/2021. ?At discharge, 06/23/2021 cbc showed hemoglobin 7.4, platelet count 110000 ? ?Patient reports feeling fatigue.  ? ?INTERVAL HISTORY ?[name] [name] is a 81 y.o. female with above history of anemia who returns to clinic for follow up.  ?who has above history reviewed by me today presents for follow up visit for anemia. She reports ongoing poor sleep and neuropathy. She was discharged from SNF and resides at home. She continues physical therapy. She has chronic back pain. She is concerned for falls and possible unexplained fractures. She is seeing her pcp for this.  ? ? ?Review of Systems  ?Constitutional:  Positive for fatigue. Negative for appetite change, chills and fever.  ?HENT:   Negative for hearing loss and voice change.    ?Eyes:  Negative for eye problems.  ?Respiratory:  Negative for chest tightness, cough and shortness of breath.   ?Cardiovascular:  Negative for chest pain.  ?Gastrointestinal:  Negative for abdominal distention, abdominal pain and blood in stool.  ?Endocrine: Negative for hot flashes.  ?Genitourinary:  Negative for difficulty urinating and frequency.   ?Musculoskeletal:  Negative for arthralgias.  ?     History of femoral fracture. Chronic left thumb deformity is secondary to trauma/fall  ?Skin:  Negative for itching and rash.  ?Neurological:  Negative for extremity weakness.  ?Hematological:  Negative for adenopathy.  ?Psychiatric/Behavioral:  Negative for confusion.   ? ?MEDICAL HISTORY:  ?Past Medical History:  ?Diagnosis Date  ? Acute on chronic diastolic (congestive) heart failure ([place])   ? Allergy   ? See list  ? Anemia 2018  ? Anxiety   ? Occasionally take Xanax for sleep  ? Anxiety associated with depression   ? Prn alprazolam   ? Anxiety associated with depression   ? Arthritis   ? Hands, Back  ? Atrial fibrillation, persistent ([place])   ? DCCV 08/22/2015  ? Bradycardia post-op bradycardia, pacer dependent  ? 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 Lovena Le  ? Cataract   ? Left eye  ? Chronic kidney disease (CKD) stage G3a/A2, moderately decreased glomerular filtration rate (GFR) between 45-59 mL/min/1.73 square meter and albuminuria creatinine ratio between 30-299 mg/g ([place]) 11/13/2021  ? Diverticulosis   ? Focal seizures ([place])   ? Heart murmur   ? Hemorrhoid   ? Hepatic cyst   ? innumerable  ? History of asbestos exposure   ? Hyperlipidemia   ? IBS (irritable  bowel syndrome)   ? 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 Migraine   ? MVP (mitral valve prolapse)   ? Nodule of right lung   ? Osteoporosis of forearm   ? RBBB   ? Restrictive lung disease   ? Mild on PFT & likely cardiac in etiology   ? S/P Minimally invasive maze operation for atrial fibrillation 10/31/2015  ? Complete bilateral atrial lesion set using  cryothermy and bipolar radiofrequency ablation with clipping of LA appendage via right mini thoracotomy approach  ? S/P minimally invasive mitral valve replacement with bioprosthetic valve 10/31/2015  ? 33 mm [facility] Mitral bovine bioprosthetic tissue valve placed via right mini thoracotomy approach  ? Seizures ([place])   ? 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 Manuella Ghazi  ? Severe mitral regurgitation   ? Skin cancer, basal cell 1991  ? 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 SVT (supraventricular tachycardia) ([place])   ? Thoracic aorta atherosclerosis ([place])   ? TIA (transient ischemic attack)   ? ? ?SURGICAL HISTORY: ?Past Surgical History:  ?Procedure Laterality Date  ? BREAST EXCISIONAL BIOPSY Right Late 80s  ? Negative X2  ? CARDIAC CATHETERIZATION N/A 10/18/2015  ? Procedure: Right/Left Heart Cath and Coronary Angiography;  Surgeon: Sherren Mocha, MD;  Location: [facility];  Service: Cardiovascular;  Laterality: N/A;  ? CARDIOVERSION N/A 08/22/2015  ? Procedure: CARDIOVERSION;  Surgeon: Thayer Headings, MD;  Location: [facility];  Service: Cardiovascular;  Laterality: N/A;  ? COLONOSCOPY  2003  ? EP IMPLANTABLE DEVICE N/A 11/06/2015  ? Procedure: Pacemaker Implant;  Surgeon: Evans Lance, MD;  Location: [facility];  Service: Cardiovascular;  Laterality: N/A;  ? EP IMPLANTABLE DEVICE N/A 02/20/2016  ? Procedure: Lead Extraction;  Surgeon: Evans Lance, MD;  Location: [facility];  Service: Cardiovascular;  Laterality: N/A;  ? EYE SURGERY Right 2013  ? FEMUR IM NAIL Left 06/18/2021  ? Procedure: INTRAMEDULLARY (IM) NAIL FEMORAL, OPEN REDUCTION INTERNAL FIXATION FEMUR RIGHT LITTLE FINGER PIP CLOSED REDUCTION;  Surgeon: Altamese [place], MD;  Location: [facility];  Service: Orthopedics;  Laterality: Left;  ? FOOT SURGERY    ? ~2007 right foot bunion  ? MANDIBLE FRACTURE SURGERY  03/26/2013  ? MINIMALLY INVASIVE MAZE PROCEDURE N/A 10/31/2015  ? Procedure: MINIMALLY INVASIVE MAZE PROCEDURE;  Surgeon:  Rexene Alberts, MD;  Location: [facility];  Service: Open Heart Surgery;  Laterality: N/A;  ? MITRAL VALVE REPLACEMENT Right 10/31/2015  ? Procedure: MINIMALLY INVASIVE MITRAL VALVE (MV) REPLACEMENT;  Surgeon: Rexene Alberts, MD;  Location: [facility];  Service: Open Heart Surgery;  Laterality: Right;  ? PACEMAKER LEAD REMOVAL  02/20/2016  ? TEE WITH CARDIOVERSION    ? TEE WITHOUT CARDIOVERSION N/A 08/22/2015  ? Procedure: TRANSESOPHAGEAL ECHOCARDIOGRAM (TEE);  Surgeon: Thayer Headings, MD;  Location: [facility];  Service: Cardiovascular;  Laterality: N/A;  ? TEE WITHOUT CARDIOVERSION N/A 10/31/2015  ? Procedure: TRANSESOPHAGEAL ECHOCARDIOGRAM (TEE);  Surgeon: Rexene Alberts, MD;  Location: [facility];  Service: Open Heart Surgery;  Laterality: N/A;  ? TUBAL LIGATION    ? VARICOSE VEIN SURGERY Right   ? ? ?SOCIAL HISTORY: ?Social History  ? ?Socioeconomic History  ? Marital status: Widowed  ?  Spouse name: Deceased  ? Number of children: 2  ? Years of education: 56  ? Highest education level: Not on file  ?Occupational History  ? Occupation: Retired  ?Tobacco Use  ? Smoking status: Never  ?  Smokeless tobacco: Never  ?Vaping Use  ? Vaping Use: Never used  ?Substance and Sexual Activity  ? Alcohol use: No  ?  Alcohol/week: 3.0 standard drinks  ?  Types: 3 Standard drinks or equivalent per week  ? Drug use: No  ? Sexual activity: Not Currently  ?Other Topics Concern  ? Not on file  ?Social History Narrative  ? ** Merged History Encounter **  ?    ? She is a widow.  Husband died from metastatic renal cell cancer. Originally from [place]. Previously lived in [place] from 1975[place]. Moved to [place] in 2007. No mold exposure recently but did have it through a prior work exposure in 1993. Has a masters in   ? public health. No bird exposure. ? ?[facility] (09/29/17): ?She has moved into a retirement community since last appointment. She reports she had testing at her new residence that was positive for mold. It has since been  treated.   ? ?Social Determinants of Health  ? ?Financial Resource Strain: Not on file  ?Food Insecurity: Not on file  ?Transportation Needs: Not on file  ?Physical Activity: Not on file  ?Stress: Not on file  ?S

## 2022-04-02 NOTE — Progress Notes (Signed)
Pt c/o "feeling wobbly this morning due to sleeping late and neuropathy. ?

## 2022-04-08 ENCOUNTER — Telehealth: Payer: Self-pay | Admitting: Internal Medicine

## 2022-04-08 ENCOUNTER — Other Ambulatory Visit: Payer: Self-pay | Admitting: Internal Medicine

## 2022-04-08 ENCOUNTER — Other Ambulatory Visit: Payer: Self-pay | Admitting: Family

## 2022-04-08 DIAGNOSIS — I5022 Chronic systolic (congestive) heart failure: Secondary | ICD-10-CM

## 2022-04-08 NOTE — Telephone Encounter (Signed)
Patient called to say she was talking with someone else at the front desk and she thinks her phone may have cut her off. ? ?Patient said she had been having back pain for over a month and she went to [facility].  Patient states the doctor she saw there diagnosed her with sciatica and prescribed her some medications.  She states the doctor said she can call her primary care doctor to see if they will prescribe her a fentanyl patch.  Patient would like for Dr. Derrel Nip to prescribe the fentanyl patch for her.  Patient states her preferred pharmacy is [facility] and the prescription has to be put in as "Deliver" since she cannot drive at this time.  Please call. ?

## 2022-04-08 NOTE — Telephone Encounter (Signed)
Rx(s) sent to pharmacy electronically.  

## 2022-04-08 NOTE — Telephone Encounter (Signed)
Pt called in stating she was seen by Olivia Mackie for her back pain and pt went to her neurologist and she want to be prescribed fentanyl patch for her back pain ?

## 2022-04-08 NOTE — Telephone Encounter (Signed)
Please schedule her appt with PCP asap to discuss fentanyl patch if PCP will Rx this to her or not  ?Or refer to pain clinic  ? ?

## 2022-04-09 ENCOUNTER — Encounter: Payer: Self-pay | Admitting: Internal Medicine

## 2022-04-09 ENCOUNTER — Telehealth (INDEPENDENT_AMBULATORY_CARE_PROVIDER_SITE_OTHER): Payer: Medicare Other | Admitting: Internal Medicine

## 2022-04-09 VITALS — Ht 64.0 in | Wt 145.9 lb

## 2022-04-09 DIAGNOSIS — M5431 Sciatica, right side: Secondary | ICD-10-CM

## 2022-04-09 MED ORDER — METAXALONE 800 MG PO TABS: 800.0000 mg | ORAL_TABLET | Freq: Three times a day (TID) | ORAL | 1 refills | Status: DC

## 2022-04-09 NOTE — Telephone Encounter (Signed)
Spoke with pt and scheduled her for an appt this afternoon with Dr. Tullo.  °

## 2022-04-09 NOTE — Progress Notes (Addendum)
Virtual Visit converted to Telephone Note  This visit type was conducted due to national recommendations for restrictions regarding the COVID-19 pandemic (e.g. social distancing).  This format is felt to be most appropriate for this patient at this time.  All issues noted in this document were discussed and addressed.  No physical exam was performed (except for noted visual exam findings with Video Visits).   I connected withNAME@ on 04/09/22 at  3:30 PM EDT by a video enabled telemedicine application and verified that I am speaking with the correct person using two identifiers. Location patient: home Location provider: work or home office Persons participating in the virtual visit: patient, provider  I discussed the limitations, risks, security and privacy concerns of performing an evaluation and management service by telephone and the availability of in person appointments. I also discussed with the patient that there may be a patient responsible charge related to this service. The patient expressed understanding and agreed to proceed.  Interactive audio and video telecommunications were attempted between this provider and patient, however failed, due to patient having technical difficulties ,  We continued and completed visit with audio only.   Reason for visit: management of right sided sciatica   HPI:  81 yr old female with osteoporosis , atrial fibrillation,  CHB s/p pacemaker implantation, focal Seizure disorder  presents for management of severe back pain.  She notes that on April 6 she developed sudden onset of excruciating pain on the right side of body was  described as "glass shards" .  Given prednisone , did not help.  Offered CT but declined at the time. Since then has seen Dr. Duanne Moron at [facility]  on May 8 and given prednisone, tramadol and skelaxin  and states that the pain is no longer "off the charts" but now tolerable. .  Slept better last night . Has been using tylenol prn.    Had an appt with neurology on April 26  and was prescribed gabapentin    taking 100 mg  three times daily , along with fioricet  has not used more than 5 per 24 hour period  ROS: See pertinent positives and negatives per HPI.  Past Medical History:  Diagnosis Date   Acute on chronic diastolic (congestive) heart failure ([place])    Allergy    See list   Anemia 2018   Anxiety    Occasionally take Xanax for sleep   Anxiety associated with depression    Prn alprazolam    Anxiety associated with depression    Arthritis    Hands, Back   Atrial fibrillation, persistent (HCC)    DCCV 08/22/2015   Bradycardia post-op bradycardia, pacer dependent   MDT PPM 11/06/15, Dr. Lovena Le   Cataract    Left eye   Chronic kidney disease (CKD) stage G3a/A2, moderately decreased glomerular filtration rate (GFR) between 45-59 mL/min/1.73 square meter and albuminuria creatinine ratio between 30-299 mg/g ([place]) 11/13/2021   Diverticulosis    Focal seizures ([place])    Heart murmur    Hemorrhoid    Hepatic cyst    innumerable   History of asbestos exposure    Hyperlipidemia    IBS (irritable bowel syndrome)    Idiopathic thrombocytopenic purpura (ITP) (HCC)    Migraine    MVP (mitral valve prolapse)    Nodule of right lung    Osteoporosis of forearm    RBBB    Restrictive lung disease    Mild on PFT & likely cardiac in etiology  Right sided sciatica 04/10/2022   S/P Minimally invasive maze operation for atrial fibrillation 10/31/2015   Complete bilateral atrial lesion set using cryothermy and bipolar radiofrequency ablation with clipping of LA appendage via right mini thoracotomy approach   S/P minimally invasive mitral valve replacement with bioprosthetic valve 10/31/2015   33 mm [facility] Mitral bovine bioprosthetic tissue valve placed via right mini thoracotomy approach   Seizures ([place])    left foot paralysis and left hand paralysis Dr. 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   SVT (supraventricular tachycardia) ([place])    Thoracic aorta atherosclerosis (HCC)    TIA (transient ischemic attack)     Past Surgical History:  Procedure Laterality Date   BREAST EXCISIONAL BIOPSY Right Late 80s   Negative X2   CARDIAC CATHETERIZATION N/A 10/18/2015   Procedure: Right/Left Heart Cath and Coronary Angiography;  Surgeon: Sherren Mocha, MD;  Location: [place] CV LAB;  Service: Cardiovascular;  Laterality: N/A;   CARDIOVERSION N/A 08/22/2015   Procedure: CARDIOVERSION;  Surgeon: Thayer Headings, MD;  Location: [facility] ENDOSCOPY;  Service: Cardiovascular;  Laterality: N/A;   COLONOSCOPY  2003   EP IMPLANTABLE DEVICE N/A 11/06/2015   Procedure: Pacemaker Implant;  Surgeon: Evans Lance, MD;  Location: [place] CV LAB;  Service: Cardiovascular;  Laterality: N/A;   EP IMPLANTABLE DEVICE N/A 02/20/2016   Procedure: Lead Extraction;  Surgeon: Evans Lance, MD;  Location: [place] CV LAB;  Service: Cardiovascular;  Laterality: N/A;   EYE SURGERY Right 2013   FEMUR IM NAIL Left 06/18/2021   Procedure: INTRAMEDULLARY (IM) NAIL FEMORAL, OPEN REDUCTION INTERNAL FIXATION FEMUR RIGHT LITTLE FINGER PIP CLOSED REDUCTION;  Surgeon: Altamese Edgewood, MD;  Location: [place];  Service: Orthopedics;  Laterality: Left;   FOOT SURGERY     ~2007 right foot bunion   MANDIBLE FRACTURE SURGERY  03/26/2013   MINIMALLY INVASIVE MAZE PROCEDURE N/A 10/31/2015   Procedure: MINIMALLY INVASIVE MAZE PROCEDURE;  Surgeon: Rexene Alberts, MD;  Location: [place];  Service: Open Heart Surgery;  Laterality: N/A;   MITRAL VALVE REPLACEMENT Right 10/31/2015   Procedure: MINIMALLY INVASIVE MITRAL VALVE (MV) REPLACEMENT;  Surgeon: Rexene Alberts, MD;  Location: [place];  Service: Open Heart Surgery;  Laterality: Right;   PACEMAKER LEAD REMOVAL  02/20/2016   TEE WITH CARDIOVERSION     TEE WITHOUT CARDIOVERSION N/A 08/22/2015   Procedure: TRANSESOPHAGEAL ECHOCARDIOGRAM (TEE);   Surgeon: Thayer Headings, MD;  Location: [place];  Service: Cardiovascular;  Laterality: N/A;   TEE WITHOUT CARDIOVERSION N/A 10/31/2015   Procedure: TRANSESOPHAGEAL ECHOCARDIOGRAM (TEE);  Surgeon: Rexene Alberts, MD;  Location: [place];  Service: Open Heart Surgery;  Laterality: N/A;   TUBAL LIGATION     VARICOSE VEIN SURGERY Right     Family History  Problem Relation Age of Onset   Hypertension Mother    Arrhythmia Mother    Heart failure Mother    Arrhythmia Brother    Stroke Brother 1       cerebral hemorrhage, nonsmoker, no HTN   Prostate cancer Brother    Stroke Father        from an aneurysm   Stroke Maternal Aunt 83       cerebral hemorrhage   Liver cancer Maternal Grandmother    Atrial fibrillation Son    Celiac disease Son    Heart attack Neg Hx    Breast cancer Neg  Hx     SOCIAL HX:  reports that she has never smoked. She has never used smokeless tobacco. She reports that she does not drink alcohol and does not use drugs.    Current Outpatient Medications:    apixaban (ELIQUIS) 5 MG TABS tablet, TAKE ONE TABLET TWICE DAILY, Disp: 180 tablet, Rfl: 2   brivaracetam (BRIVIACT) 100 MG TABS tablet, Take 100 mg by mouth 2 (two) times daily., Disp: , Rfl:    BRIVIACT 50 MG TABS, Take 1 tablet by mouth 2 (two) times daily., Disp: , Rfl:    butalbital-acetaminophen-caffeine (FIORICET) 50-325-40 MG tablet, TAKE 1 TABLET BY MOUTH ONCE DAILY AS NEEDED FOR BACK PAIN OR MIGRAINE *MAX OF 3 TABS PER DAY*, Disp: 90 tablet, Rfl: 0   Cholecalciferol (VITAMIN D) 2000 units CAPS, Take 2,000 Units by mouth daily., Disp: , Rfl:    fluticasone (FLONASE) 50 MCG/ACT nasal spray, USE 2 SPRAYS IN BOTH NOSTRILS DAILY, Disp: 16 g, Rfl: 11   Folic Acid 0.8 MG CAPS, Take 0.8 mg by mouth daily., Disp: , Rfl:    furosemide (LASIX) 40 MG tablet, Take one tablet twice per week. Take an additional 0.5-1 tablet as needed for fluid retention., Disp: 30 tablet, Rfl: 11   gabapentin (NEURONTIN) 100 MG  capsule, Take 1 capsule by mouth 3 (three) times daily., Disp: , Rfl:    hydroquinone 4 % cream, Apply topically in the morning and at bedtime., Disp: , Rfl:    lamoTRIgine (LAMICTAL) 150 MG tablet, Take 200 mg by mouth 2 (two) times daily., Disp: , Rfl:    lamoTRIgine (LAMICTAL) 200 MG tablet, Take 150 mg in the am & 200 mg in the pm., Disp: , Rfl:    losartan (COZAAR) 25 MG tablet, TAKE ONE-HALF (1/2) TABLET BY MOUTH EVERY DAY, Disp: 45 tablet, Rfl: 2   metoprolol succinate (TOPROL-XL) 25 MG 24 hr tablet, TAKE 1 TABLET BY MOUTH DAILY, Disp: 90 tablet, Rfl: 2   montelukast (SINGULAIR) 10 MG tablet, TAKE ONE TABLET BY MOUTH AT BEDTIME, Disp: 90 tablet, Rfl: 1   polyethylene glycol powder (GLYCOLAX/MIRALAX) 17 GM/SCOOP powder, Take 1 Dose by mouth daily., Disp: , Rfl:    predniSONE (DELTASONE) 10 MG tablet, Take 10 mg by mouth 2 (two) times daily., Disp: , Rfl:    rosuvastatin (CRESTOR) 40 MG tablet, TAKE ONE TABLET EVERY DAY, Disp: 90 tablet, Rfl: 1   spironolactone (ALDACTONE) 25 MG tablet, Take 0.5 tablets (12.5 mg total) by mouth every other day., Disp: , Rfl:    traMADol (ULTRAM) 50 MG tablet, Take 50 mg by mouth every 6 (six) hours as needed., Disp: , Rfl:    VENTOLIN HFA 108 (90 Base) MCG/ACT inhaler, INHALE 2 PUFFS INTO THE LUNGS EVERY 6 HOURS AS NEEDED FOR WHEEZING OR SHORTNESS OF BREATH, Disp: 18 g, Rfl: 1   vitamin B-12 (CYANOCOBALAMIN) 1000 MCG tablet, Take 1,000 mcg by mouth daily., Disp: , Rfl:    amoxicillin (AMOXIL) 500 MG capsule, TAKE 4 CAPSULES ONE HOUR PRIOR TO DENTALPROCEDURE (Patient not taking: Reported on 04/02/2022), Disp: 30 capsule, Rfl: 0   metaxalone (SKELAXIN) 800 MG tablet, Take 1 tablet (800 mg total) by mouth 3 (three) times daily., Disp: 90 tablet, Rfl: 1  EXAM:   General impression: alert, cooperative and articulate.  No signs of being in distress  Lungs: speech is fluent sentence length suggests that patient is not short of breath and not punctuated by cough,  sneezing or sniffing. Marland Kitchen   Psych: affect  normal.  speech is articulate and non pressured .  Denies suicidal thoughts      ASSESSMENT AND PLAN:  Discussed the following assessment and plan:  Right sided sciatica - Plan: metaxalone (SKELAXIN) 800 MG tablet, CT Lumbar Spine Wo Contrast  Right sided sciatica Pain is moderately controlled on current regimen of fioricet, tramadol and muscle relaxer..  She has allergies to  Oxycodone;  Request for fentanyl deferred .  She has an appt with her orthopedist in a week.  CT scan of lumbar spine ordered     I discussed the assessment and treatment plan with the patient. The patient was provided an opportunity to ask questions and all were answered. The patient agreed with the plan and demonstrated an understanding of the instructions.   The patient was advised to call back or seek an in-person evaluation if the symptoms worsen or if the condition fails to improve as anticipated.   I spent 30 minutes dedicated to the care of this patient on the date of this encounter to include pre-visit review of her medical history,  her recent urgent care visit and refill history of controlled substance via the [place] database,  non Face-to-face time with the patient , and post visit ordering of testing and therapeutics.    [name], MD

## 2022-04-10 ENCOUNTER — Encounter: Payer: Self-pay | Admitting: Internal Medicine

## 2022-04-10 DIAGNOSIS — M5431 Sciatica, right side: Secondary | ICD-10-CM

## 2022-04-10 HISTORY — DX: Sciatica, right side: M54.31

## 2022-04-10 NOTE — Assessment & Plan Note (Addendum)
Pain is moderately controlled on current regimen of fioricet, tramadol and muscle relaxer..  She has allergies to  Oxycodone;  Request for fentanyl deferred .  She has an appt with her orthopedist in a week.  CT scan of lumbar spine ordered  ?

## 2022-04-11 ENCOUNTER — Telehealth: Payer: Self-pay

## 2022-04-11 ENCOUNTER — Ambulatory Visit (HOSPITAL_COMMUNITY)
Admission: RE | Admit: 2022-04-11 | Discharge: 2022-04-11 | Disposition: A | Discharge status: Home / Self Care | Payer: Medicare Other | Source: Ambulatory Visit | Attending: Internal Medicine | Admitting: Internal Medicine

## 2022-04-11 DIAGNOSIS — M5431 Sciatica, right side: Secondary | ICD-10-CM | POA: Diagnosis present

## 2022-04-11 NOTE — Telephone Encounter (Signed)
Patient called to say she doesn't see her CT Scan scheduled yet and she was supposed to have it on or before 04/16/2022.  Also, patient said she would like to verify that we have ordered her prescription for Skelaxin.  Patient states her preferred pharmacy is [facility] and the prescription must say "Deliver". ?

## 2022-04-11 NOTE — Telephone Encounter (Signed)
Spoke with pt to let her know that the medication has been sent to [facility] pharmacy. Also let the pt know that the CT scan has to be approved by insurance before being scheduled. Pt stated that she needs this done before 04/16/2022. Spoke with Dr. Derrel Nip and she asked that I change the order to a STAT order. Order has been changed.  ?

## 2022-04-11 NOTE — Telephone Encounter (Signed)
PA for [facility] has been submitted on covermymeds and approved.  ?

## 2022-04-11 NOTE — Addendum Note (Signed)
Addended by: Adair Laundry on: 04/11/2022 09:39 AM ? ? Modules accepted: Orders ? ?

## 2022-04-16 ENCOUNTER — Other Ambulatory Visit (INDEPENDENT_AMBULATORY_CARE_PROVIDER_SITE_OTHER): Payer: Self-pay | Admitting: Nurse Practitioner

## 2022-04-16 DIAGNOSIS — I6523 Occlusion and stenosis of bilateral carotid arteries: Secondary | ICD-10-CM

## 2022-04-19 ENCOUNTER — Ambulatory Visit (INDEPENDENT_AMBULATORY_CARE_PROVIDER_SITE_OTHER): Payer: Medicare Other | Admitting: Vascular Surgery

## 2022-04-19 ENCOUNTER — Encounter (INDEPENDENT_AMBULATORY_CARE_PROVIDER_SITE_OTHER): Payer: Self-pay | Admitting: Vascular Surgery

## 2022-04-19 ENCOUNTER — Ambulatory Visit (INDEPENDENT_AMBULATORY_CARE_PROVIDER_SITE_OTHER): Payer: Medicare Other

## 2022-04-19 VITALS — BP 98/62 | HR 91 | Resp 16 | Wt 141.4 lb

## 2022-04-19 DIAGNOSIS — R569 Unspecified convulsions: Secondary | ICD-10-CM

## 2022-04-19 DIAGNOSIS — I6523 Occlusion and stenosis of bilateral carotid arteries: Secondary | ICD-10-CM

## 2022-04-19 DIAGNOSIS — E785 Hyperlipidemia, unspecified: Secondary | ICD-10-CM

## 2022-04-19 DIAGNOSIS — I4819 Other persistent atrial fibrillation: Secondary | ICD-10-CM | POA: Diagnosis not present

## 2022-04-19 NOTE — Assessment & Plan Note (Signed)
Carotid duplex shows velocities in the 1 to 39% range bilaterally slightly improved from her studies last year which were in the 40 to 59% range.  No role for intervention.  On appropriate medical therapy with anticoagulation for her atrial fibrillation and the statin agent.  Recheck in 1 year.

## 2022-04-19 NOTE — Progress Notes (Signed)
MRN : [id number]  [name] [name] is a 81 y.o. (1941/09/01) female who presents with chief complaint of  Chief Complaint  Patient presents with   Follow-up    Ultrasound follow up  .  History of Present Illness: Patient returns in follow-up of her carotid disease.  She has had a very rough spring with a femur fracture and then severe back issues.  This has been very painful and debilitating for her.  She denies any focal neurologic symptoms. Specifically, the patient denies amaurosis fugax, speech or swallowing difficulties, or arm or leg weakness or numbness.  Carotid duplex shows velocities in the 1 to 39% range bilaterally slightly improved from her studies last year which were in the 40 to 59% range.  Current Outpatient Medications  Medication Sig Dispense Refill   apixaban (ELIQUIS) 5 MG TABS tablet TAKE ONE TABLET TWICE DAILY 180 tablet 2   brivaracetam (BRIVIACT) 100 MG TABS tablet Take 100 mg by mouth 2 (two) times daily.     BRIVIACT 50 MG TABS Take 1 tablet by mouth 2 (two) times daily.     butalbital-acetaminophen-caffeine (FIORICET) 50-325-40 MG tablet TAKE 1 TABLET BY MOUTH ONCE DAILY AS NEEDED FOR BACK PAIN OR MIGRAINE *MAX OF 3 TABS PER DAY* 90 tablet 0   Cholecalciferol (VITAMIN D) 2000 units CAPS Take 2,000 Units by mouth daily.     fluticasone (FLONASE) 50 MCG/ACT nasal spray USE 2 SPRAYS IN BOTH NOSTRILS DAILY 16 g 11   Folic Acid 0.8 MG CAPS Take 0.8 mg by mouth daily.     furosemide (LASIX) 40 MG tablet Take one tablet twice per week. Take an additional 0.5-1 tablet as needed for fluid retention. 30 tablet 11   gabapentin (NEURONTIN) 100 MG capsule Take 1 capsule by mouth 3 (three) times daily.     hydroquinone 4 % cream Apply topically in the morning and at bedtime.     lamoTRIgine (LAMICTAL) 200 MG tablet Take 150 mg in the am & 200 mg in the pm.     losartan (COZAAR) 25 MG tablet TAKE ONE-HALF (1/2) TABLET BY MOUTH EVERY DAY 45 tablet 2   metaxalone (SKELAXIN)  800 MG tablet Take 1 tablet (800 mg total) by mouth 3 (three) times daily. 90 tablet 1   metoprolol succinate (TOPROL-XL) 25 MG 24 hr tablet TAKE 1 TABLET BY MOUTH DAILY 90 tablet 2   montelukast (SINGULAIR) 10 MG tablet TAKE ONE TABLET BY MOUTH AT BEDTIME 90 tablet 1   polyethylene glycol powder (GLYCOLAX/MIRALAX) 17 GM/SCOOP powder Take 1 Dose by mouth daily.     rosuvastatin (CRESTOR) 40 MG tablet TAKE ONE TABLET EVERY DAY 90 tablet 1   spironolactone (ALDACTONE) 25 MG tablet Take 0.5 tablets (12.5 mg total) by mouth every other day.     traMADol (ULTRAM) 50 MG tablet Take 50 mg by mouth every 6 (six) hours as needed.     VENTOLIN HFA 108 (90 Base) MCG/ACT inhaler INHALE 2 PUFFS INTO THE LUNGS EVERY 6 HOURS AS NEEDED FOR WHEEZING OR SHORTNESS OF BREATH 18 g 1   vitamin B-12 (CYANOCOBALAMIN) 1000 MCG tablet Take 1,000 mcg by mouth daily.     amoxicillin (AMOXIL) 500 MG capsule TAKE 4 CAPSULES ONE HOUR PRIOR TO DENTALPROCEDURE (Patient not taking: Reported on 04/02/2022) 30 capsule 0   lamoTRIgine (LAMICTAL) 150 MG tablet Take 200 mg by mouth 2 (two) times daily.     predniSONE (DELTASONE) 10 MG tablet Take 10 mg by mouth 2 (  two) times daily. (Patient not taking: Reported on 04/19/2022)     No current facility-administered medications for this visit.    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 Lovena Le   Cataract    Left eye   Chronic kidney disease (CKD) stage G3a/A2, moderately decreased glomerular filtration rate (GFR) between 45-59 mL/min/1.73 square meter and albuminuria creatinine ratio  between 30-299 mg/g ([place]) 11/13/2021   Diverticulosis    Focal seizures ([place])    Heart murmur    Hemorrhoid    Hepatic cyst    innumerable   History of asbestos exposure    Hyperlipidemia    IBS (irritable bowel syndrome)    Idiopathic thrombocytopenic purpura (ITP) (HCC)    Migraine    MVP (mitral valve prolapse)    Nodule of right lung    Osteoporosis of forearm    RBBB    Restrictive lung disease    Mild on PFT & likely cardiac in etiology    Right sided sciatica 04/10/2022   S/P Minimally invasive maze operation for atrial fibrillation 10/31/2015   Complete bilateral atrial lesion set using cryothermy and bipolar radiofrequency ablation with clipping of LA appendage via right mini thoracotomy approach   S/P minimally invasive mitral valve replacement with bioprosthetic valve 10/31/2015   33 mm Edwards Magna Mitral bovine bioprosthetic tissue valve placed via right mini thoracotomy approach   Seizures ([place])    left foot paralysis and left hand paralysis Dr. 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   SVT (supraventricular tachycardia) ([place])    Thoracic aorta atherosclerosis (HCC)    TIA (transient ischemic attack)     Past Surgical History:  Procedure Laterality Date   BREAST EXCISIONAL BIOPSY Right Late 80s   Negative X2   CARDIAC CATHETERIZATION N/A 10/18/2015   Procedure: Right/Left Heart Cath and Coronary Angiography;  Surgeon: Sherren Mocha, MD;  Location: [place] [facility];  Service: Cardiovascular;  Laterality: N/A;   CARDIOVERSION N/A 08/22/2015   Procedure: CARDIOVERSION;  Surgeon: Thayer Headings, MD;  Location: [facility];  Service: Cardiovascular;  Laterality: N/A;   COLONOSCOPY  2003   EP IMPLANTABLE DEVICE N/A 11/06/2015   Procedure: Pacemaker Implant;  Surgeon: Evans Lance, MD;  Location: [place] [facility];  Service: Cardiovascular;  Laterality: N/A;   EP IMPLANTABLE DEVICE N/A 02/20/2016   Procedure:  Lead Extraction;  Surgeon: Evans Lance, MD;  Location: [place] [facility];  Service: Cardiovascular;  Laterality: N/A;   EYE SURGERY Right 2013   FEMUR IM NAIL Left 06/18/2021   Procedure: INTRAMEDULLARY (IM) NAIL FEMORAL, OPEN REDUCTION INTERNAL FIXATION FEMUR RIGHT LITTLE FINGER PIP CLOSED REDUCTION;  Surgeon: Altamese [place], MD;  Location: [place];  Service: Orthopedics;  Laterality: Left;   FOOT SURGERY     ~2007 right foot bunion   MANDIBLE FRACTURE SURGERY  03/26/2013   MINIMALLY INVASIVE MAZE PROCEDURE N/A 10/31/2015   Procedure: MINIMALLY INVASIVE MAZE PROCEDURE;  Surgeon: Rexene Alberts, MD;  Location: [place];  Service: Open Heart Surgery;  Laterality: N/A;   MITRAL VALVE REPLACEMENT Right 10/31/2015   Procedure: MINIMALLY INVASIVE MITRAL VALVE (MV) REPLACEMENT;  Surgeon: Rexene Alberts, MD;  Location: [place];  Service: Open Heart Surgery;  Laterality: Right;   PACEMAKER LEAD REMOVAL  02/20/2016   TEE WITH CARDIOVERSION     TEE WITHOUT CARDIOVERSION N/A 08/22/2015   Procedure: TRANSESOPHAGEAL ECHOCARDIOGRAM (TEE);  Surgeon: Thayer Headings, MD;  Location: [place];  Service: Cardiovascular;  Laterality: N/A;   TEE WITHOUT CARDIOVERSION N/A 10/31/2015   Procedure: TRANSESOPHAGEAL ECHOCARDIOGRAM (TEE);  Surgeon: Rexene Alberts, MD;  Location: [place];  Service: Open Heart Surgery;  Laterality: N/A;   TUBAL LIGATION     VARICOSE VEIN SURGERY Right      Social History   Tobacco Use   Smoking status: Never   Smokeless tobacco: Never  Vaping Use   Vaping Use: Never used  Substance Use Topics   Alcohol use: No    Alcohol/week: 3.0 standard drinks    Types: 3 Standard drinks or equivalent per week   Drug use: No      Family History  Problem Relation Age of Onset   Hypertension Mother    Arrhythmia Mother    Heart failure Mother    Arrhythmia Brother    Stroke Brother 62       cerebral hemorrhage, nonsmoker, no HTN   Prostate cancer Brother    Stroke Father         from an aneurysm   Stroke Maternal Aunt 83       cerebral hemorrhage   Liver cancer Maternal Grandmother    Atrial fibrillation Son    Celiac disease Son    Heart attack Neg Hx    Breast cancer Neg Hx      Allergies  Allergen Reactions   Acetaminophen-Codeine Other (See Comments)   Codeine Nausea And Vomiting and Other (See Comments)    migraine   Epinephrine Palpitations   Hydrocodone Nausea And Vomiting and Other (See Comments)    MIGRAINE   Hydromorphone Nausea And Vomiting and Other (See Comments)    migraine   Oxycodone Nausea And Vomiting and Other (See Comments)    Severe migraine   Meloxicam Other (See Comments)    Severe reflux   Codeine Other (See Comments)    Migraine   Dextromethorphan Other (See Comments)    seizures   Diphen [Diphenhydramine Hcl] Other (See Comments)    seizure   Diphenhydramine Other (See Comments)    seizure   Diphenhydramine Hcl     Other reaction(s): Other (See Comments)   Diphenylpyraline Other (See Comments)   Doxycycline Itching, Swelling and Other (See Comments)    Facial   Doxycycline Itching and Swelling   Hydrocodone Other (See Comments)    Migraine   Hydromorphone Other (See Comments)    Migraine   Meloxicam Other (See Comments)   Mobic [Meloxicam] Other (See Comments)    reflux   Nsaids    Nsaids Other (See Comments)    Pt on blood thinner   Oxycodone Other (See Comments)    Migraine   Propofol Other (See Comments)   Propofol Other (See Comments)    Very sensitive; patient stated she was told she was told she had apnea   Tylenol [Acetaminophen] Other (See Comments)    TYLENOL #3 - MIGRAINE    REVIEW OF SYSTEMS (Negative unless checked)  Constitutional: '[]'$ Weight loss  '[]'$ Fever  '[]'$ Chills Cardiac: '[]'$ Chest pain   '[]'$ Chest pressure   '[]'$ Palpitations   '[]'$ Shortness of breath when laying flat   '[]'$ Shortness of breath at rest   '[]'$ Shortness of breath with exertion. Vascular:  '[]'$ Pain in legs with walking   '[]'$ Pain in legs at  rest   '[]'$ Pain in legs when laying flat   '[]'$ Claudication   '[]'$ Pain in feet when walking  '[]'$ Pain in feet at rest  '[]'$ Pain in feet when laying flat   '[]'$ History of DVT   '[]'$ Phlebitis   '[]'$ Swelling in legs   '[]'$ Varicose veins   '[]'$ Non-healing ulcers Pulmonary:   '[]'$ Uses home oxygen   '[]'$ Productive cough   '[]'$ Hemoptysis   '[]'$ Wheeze  '[]'$ COPD   '[]'$ Asthma Neurologic:  '[]'$ Dizziness  '[]'$ Blackouts   '[x]'$ Seizures   '[]'$ History of stroke   '[x]'$ History of TIA  '[]'$ Aphasia   '[]'$ Temporary blindness   '[]'$ Dysphagia   '[x]'$ Weakness or numbness in arms   '[x]'$ Weakness or numbness in legs Musculoskeletal:  '[x]'$ Arthritis   '[]'$ Joint swelling   '[]'$ Joint pain   '[x]'$ Low back pain Hematologic:  '[]'$ Easy bruising  '[]'$ Easy bleeding   '[]'$ Hypercoagulable state   '[]'$ Anemic  '[]'$ Hepatitis Gastrointestinal:  '[]'$ Blood in stool   '[]'$ Vomiting blood  '[]'$ Gastroesophageal reflux/heartburn   '[]'$ Abdominal pain Genitourinary:  '[]'$ Chronic kidney disease   '[]'$ Difficult urination  '[]'$ Frequent urination  '[]'$ Burning with urination   '[]'$ Hematuria Skin:  '[]'$ Rashes   '[]'$ Ulcers   '[]'$ Wounds Psychological:  '[x]'$ History of anxiety   '[]'$  History of major depression.  Physical Examination  Vitals:   04/19/22 0944  BP: 98/62  Pulse: 91  Resp: 16  Weight: 141 lb 6.4 oz (64.1 kg)   Body mass index is 24.27 kg/m. Gen:  WD/WN, NAD Head: [place]/AT, No temporalis wasting. Ear/Nose/Throat: Hearing grossly intact, nares w/o erythema or drainage, trachea midline Eyes: Conjunctiva clear. Sclera non-icteric Neck: Supple.  No bruit  Pulmonary:  Good air movement, equal and clear to auscultation bilaterally.  Cardiac: RRR, No JVD Vascular:  Vessel Right Left  Radial Palpable Palpable       Musculoskeletal: M/S 5/5 throughout.  No deformity or atrophy.  No significant lower extremity edema. Neurologic: CN 2-12 intact. Sensation grossly intact in extremities.  Symmetrical.  Speech is fluent. Motor exam as listed above. Psychiatric: Judgment intact, Mood & affect appropriate for pt's clinical  situation. Dermatologic: No rashes or ulcers noted.  No cellulitis or open wounds.     CBC Lab Results  Component Value Date   WBC 4.9 04/02/2022   HGB 11.0 (L) 04/02/2022   HCT 32.9 (L) 04/02/2022   MCV 93.2 04/02/2022   PLT 124 (L) 04/02/2022    BMET    Component Value Date/Time   NA 134 (L) 04/02/2022 1022   NA 140 03/26/2021 1526   K 3.7 04/02/2022 1022   CL 97 (L) 04/02/2022 1022   CO2 26 04/02/2022 1022   GLUCOSE 93 04/02/2022 1022   BUN 33 (H) 04/02/2022 1022   BUN 25 03/26/2021 1526   CREATININE 1.09 (H) 04/02/2022 1022   CREATININE 0.90 02/26/2018 0818   CALCIUM 9.3 04/02/2022 1022   GFRNONAA 51 (L) 04/02/2022 1022   GFRAA 63 01/25/2021 1417   Estimated Creatinine Clearance: 35 mL/min (A) (by C-G formula based on SCr of 1.09 mg/dL (H)).  COAG Lab Results  Component Value Date   INR 1.2 06/18/2021   INR 1.0 06/17/2021   INR 1.11 05/06/2017    Radiology CT Lumbar Spine Wo Contrast  Result Date: 04/11/2022 CLINICAL DATA:  Lumbar radiculopathy EXAM: CT LUMBAR SPINE WITHOUT CONTRAST TECHNIQUE: Multidetector CT imaging of the lumbar spine was performed without intravenous contrast administration. Multiplanar CT image reconstructions were also generated. RADIATION DOSE REDUCTION: This exam was performed according to the departmental dose-optimization program which includes automated exposure control, adjustment of the mA and/or kV according to patient size and/or use of iterative reconstruction technique. COMPARISON:  Lumbar spine radiographs 04/12/2021 FINDINGS: Segmentation: 5 lumbar segments Alignment: 3 mm retrolisthesis T12-L1. 6 mm retrolisthesis L1-2. 4 mm retrolisthesis L2-2 3. 8 mm anterolisthesis L4-5. 4 mm anterolisthesis L5-S1 Marked dextroscoliosis approximately 45 degrees convex to the right at L2 Vertebrae: Negative for spinal fracture, mass, or infection. Bilateral pars defects of L5 as described below. Chronic healed fracture of the right eleventh rib  posteriorly. Displaced fracture of the right twelfth rib. There is some early callus formation and this appears to be a recent fracture. Paraspinal and other soft tissues: Atherosclerotic calcification in the aorta and iliacs. Negative for aneurysm. No retroperitoneal mass or adenopathy. Multiple hepatic cysts as noted on prior studies. Bilateral renal cysts. Disc levels: T12-L1: Asymmetric disc degeneration on the left with severe disc space narrowing on the left and associated spurring. Mild left foraminal narrowing. L1-2: Asymmetric disc degeneration on the left which is severe with marked disc space narrowing, endplate sclerosis, and spurring. Moderate left foraminal narrowing. Spinal canal adequate in diameter. L2-3: Asymmetric disc degeneration on the left with mild associated spurring. Bilateral facet hypertrophy. Right lateral subluxation of L2 on L3 due to scoliosis. Mild spinal stenosis. Mild foraminal narrowing bilaterally. L3-4: Right lateral subluxation of L3 on L4. Mild disc degeneration and moderate facet degeneration. Moderate foraminal encroachment on the right. Left foramen patent. L4-5: Left lateral subluxation of L4-L5. Severe facet degeneration bilaterally. Asymmetric disc degeneration on the right with disc space narrowing. 8 mm anterolisthesis. Moderate spinal stenosis. Moderate to severe subarticular stenosis on the right and mild left subarticular stenosis L5-S1: 4 mm anterolisthesis. Bilateral pars defects of L5. The right pars defect was present on CT abdomen pelvis 2016. The left pars defect was not present at that time in may be recent. Spinal canal widely patent. Neural foramina patent bilaterally. IMPRESSION: 1. 45 degrees of dextroscoliosis in the lumbar spine. 2. Multilevel disc and facet degeneration with spinal and foraminal stenosis as described above. 3. Multilevel spondylolisthesis. Lateral subluxation at L2-3, L3-4, L4-5 due to scoliosis. 4. Bilateral pars defects of L5. The  right pars defect is chronic and was present in 2016 however the left pars defect but not present 2016 and may be recent. 5. Healed fracture right eleventh rib. Displaced fracture right twelfth ribs with early callus formation. Correlate with injury. Electronically Signed   By: Franchot Gallo M.D.   On: 04/11/2022 13:51     Assessment/Plan Hyperlipidemia lipid control important in reducing the progression of atherosclerotic disease. Continue statin therapy     Atrial fibrillation, persistent ([place]) On anticoagulation     Focal seizures ([place]) Follows with neurology.  Carotid stenosis Carotid duplex shows velocities in the 1 to 39% range bilaterally slightly improved from her studies last year which were in the 40 to 59% range.  No role for intervention.  On appropriate medical therapy with anticoagulation for her atrial fibrillation and the statin agent.  Recheck in 1 year.    Leotis Pain, MD  04/19/2022 11:07 AM    This note was created with Dragon medical transcription system.  Any errors from dictation are purely unintentional

## 2022-04-21 ENCOUNTER — Encounter: Payer: Self-pay | Admitting: Internal Medicine

## 2022-04-22 NOTE — Telephone Encounter (Signed)
Pt sent a my chart message about her back pain

## 2022-04-23 ENCOUNTER — Other Ambulatory Visit: Payer: Self-pay | Admitting: Cardiovascular Disease

## 2022-04-23 ENCOUNTER — Telehealth: Payer: Self-pay | Admitting: Cardiovascular Disease

## 2022-04-23 ENCOUNTER — Other Ambulatory Visit: Payer: Self-pay | Admitting: Orthopedic Surgery

## 2022-04-23 DIAGNOSIS — M48061 Spinal stenosis, lumbar region without neurogenic claudication: Secondary | ICD-10-CM

## 2022-04-23 NOTE — Telephone Encounter (Signed)
   Pre-operative Risk Assessment    Patient Name: [name] [name]  DOB: 30-Oct-1941 MRN: [id number]      Request for Surgical Clearance    Procedure:   lumbar epidural   Date of Surgery:  Clearance TBD                                 Surgeon:  not noted   Surgeon's Group or Practice Name:  [facility]  Phone number:  [phone]  Fax number:  [phone]    Type of Clearance Requested:   - Pharmacy:  Hold Apixaban (Eliquis) 2 days    Type of Anesthesia:  Not Indicated   Additional requests/questions:    Jonathon Jordan   04/23/2022, 2:58 PM

## 2022-04-24 NOTE — Telephone Encounter (Signed)
Patient with diagnosis of atrial fibrillation on Eliquis for anticoagulation.    Procedure: lumbar epidural Date of procedure: TBD   CHA2DS2-VASc Score = 6   This indicates a 9.7% annual risk of stroke. The patient's score is based upon: CHF History: 1 HTN History: 0 Diabetes History: 0 Stroke History: 2 Vascular Disease History: 0 Age Score: 2 Gender Score: 1   CrCl 35 Platelet count 124  Per office protocol, patient will need to hold Eliquis for 3 days prior to procedure.    Due to history of multiple TIA's (2017, 2018) will send to primary cardiologist for final review

## 2022-04-24 NOTE — Telephone Encounter (Signed)
Pharmacy has addressed Eliquis hold.  Preoperative team, please contact this patient and set up a phone call appointment for further cardiac evaluation.  Thank you for your help.  Jossie Ng. Amisha Pospisil NP-C    04/24/2022, 11:15 AM [address] Office [phone] Fax [phone]

## 2022-04-30 NOTE — Telephone Encounter (Signed)
Patient was returning call. Patient also states they changing the surgeon who is doing procedure. Please advise

## 2022-04-30 NOTE — Telephone Encounter (Signed)
I s/w pt and she tells me that she is not having procedure with [facility]. She is going to see [facility] 05/06/22 and she will have them fax over a clearance request when they are ready to proceed. Per pt to cancel the [facility] request.

## 2022-04-30 NOTE — Telephone Encounter (Signed)
Left message for the pt to call to schedule a tele pre op appt 

## 2022-05-02 ENCOUNTER — Encounter: Payer: Self-pay | Admitting: Internal Medicine

## 2022-05-06 NOTE — Telephone Encounter (Unsigned)
I sent pt a mychart to let her know.  $0 due, no PA required. If pt is okay to have it based on her last medication, she may receive it at her upcoming appt.

## 2022-05-07 NOTE — Telephone Encounter (Signed)
noted 

## 2022-05-07 NOTE — Telephone Encounter (Signed)
She can resume prolia now, so her next appt will be fine

## 2022-05-08 ENCOUNTER — Encounter: Payer: Self-pay | Admitting: Internal Medicine

## 2022-05-08 ENCOUNTER — Ambulatory Visit (INDEPENDENT_AMBULATORY_CARE_PROVIDER_SITE_OTHER): Payer: Medicare Other | Admitting: Internal Medicine

## 2022-05-08 DIAGNOSIS — F418 Other specified anxiety disorders: Secondary | ICD-10-CM

## 2022-05-08 DIAGNOSIS — M5416 Radiculopathy, lumbar region: Secondary | ICD-10-CM

## 2022-05-08 DIAGNOSIS — N1831 Chronic kidney disease, stage 3a: Secondary | ICD-10-CM

## 2022-05-08 DIAGNOSIS — I6523 Occlusion and stenosis of bilateral carotid arteries: Secondary | ICD-10-CM

## 2022-05-08 DIAGNOSIS — D6869 Other thrombophilia: Secondary | ICD-10-CM | POA: Diagnosis not present

## 2022-05-08 DIAGNOSIS — D696 Thrombocytopenia, unspecified: Secondary | ICD-10-CM

## 2022-05-08 DIAGNOSIS — G40909 Epilepsy, unspecified, not intractable, without status epilepticus: Secondary | ICD-10-CM

## 2022-05-08 MED ORDER — TETANUS-DIPHTH-ACELL PERTUSSIS 5-2.5-18.5 LF-MCG/0.5 IM SUSY: 0.5000 mL | PREFILLED_SYRINGE | Freq: Once | INTRAMUSCULAR | 0 refills | Status: AC

## 2022-05-08 NOTE — Progress Notes (Signed)
Subjective:  Patient ID: [name] [name], female    DOB: 08-11-1941  Age: 81 y.o. MRN: [id number]  CC: Diagnoses of Acquired thrombophilia ([place]), Anxiety associated with depression, Chronic kidney disease (CKD) stage G3a/A2, moderately decreased glomerular filtration rate (GFR) between 45-59 mL/min/1.73 square meter and albuminuria creatinine ratio between 30-299 mg/g (HCC), Thrombocytopenia ([place]), Seizure disorder ([place]), and Lumbar radiculitis were pertinent to this visit.   HPI [name] [name] presents for follow up on osteoporosis management,  back pain, seizure disorder , PAF and CKD  Chief Complaint  Patient presents with   Follow-up    6 month follow up    1) Osteoporosis:  has decided to resume Prolia.  Last dose of Evenity was #3 on April 3 ,  and  after shot #2  she  felt like she was going to have a seizure .  last Prolia injection July 2022   2) Lumbar radiculitis : still having pain,  but improving .  Pain  is provoked by  prolonged sitting,  radiates to both knees .  She is scheduled for ESI next week by [facility].   Not using tramadol very often.  Trying to wean from Skelaxin because she is concerned about its effect on her renal function.  Reviewed the safety concerns with skelaxin, primarily related to decreased clearance in patients with GFR < 40 ml/min.   Still usring gabapentin 200 mg tid and taking tylenol scheduled     3)  prediabetes:  most recent a1c was 5.9 in late April .  Has been less active due to lumbar radiculitis.  Diet is very sensible and Mediterannean    4) some urinary urgency and nocturnal incontinence improved.  Occasional dysuria but fleeting symptoms    Outpatient Medications Prior to Visit  Medication Sig Dispense Refill   apixaban (ELIQUIS) 5 MG TABS tablet TAKE ONE TABLET TWICE DAILY 180 tablet 2   BRIVIACT 50 MG TABS Take 1 tablet by mouth 2 (two) times daily.     butalbital-acetaminophen-caffeine (FIORICET) 50-325-40 MG tablet TAKE 1 TABLET BY  MOUTH ONCE DAILY AS NEEDED FOR BACK PAIN OR MIGRAINE *MAX OF 3 TABS PER DAY* 90 tablet 0   Cholecalciferol (VITAMIN D) 2000 units CAPS Take 2,000 Units by mouth daily.     fluticasone (FLONASE) 50 MCG/ACT nasal spray USE 2 SPRAYS IN BOTH NOSTRILS DAILY 16 g 11   Folic Acid 0.8 MG CAPS Take 0.8 mg by mouth daily.     furosemide (LASIX) 40 MG tablet Take one tablet twice per week. Take an additional 0.5-1 tablet as needed for fluid retention. 30 tablet 11   gabapentin (NEURONTIN) 100 MG capsule Take by mouth.     hydroquinone 4 % cream Apply topically in the morning and at bedtime.     lamoTRIgine (LAMICTAL) 150 MG tablet Take 200 mg by mouth 2 (two) times daily.     lamoTRIgine (LAMICTAL) 200 MG tablet Take 150 mg in the am & 200 mg in the pm.     losartan (COZAAR) 25 MG tablet TAKE ONE-HALF (1/2) TABLET BY MOUTH EVERY DAY 45 tablet 2   metaxalone (SKELAXIN) 800 MG tablet Take 1 tablet (800 mg total) by mouth 3 (three) times daily. 90 tablet 1   metoprolol succinate (TOPROL-XL) 25 MG 24 hr tablet TAKE 1 TABLET BY MOUTH DAILY 90 tablet 2   montelukast (SINGULAIR) 10 MG tablet TAKE ONE TABLET BY MOUTH AT BEDTIME 90 tablet 1   polyethylene glycol powder (GLYCOLAX/MIRALAX) 17  GM/SCOOP powder Take 1 Dose by mouth daily.     rosuvastatin (CRESTOR) 40 MG tablet TAKE ONE TABLET EVERY DAY 90 tablet 1   spironolactone (ALDACTONE) 25 MG tablet Take 0.5 tablets (12.5 mg total) by mouth every other day.     VENTOLIN HFA 108 (90 Base) MCG/ACT inhaler INHALE 2 PUFFS INTO THE LUNGS EVERY 6 HOURS AS NEEDED FOR WHEEZING OR SHORTNESS OF BREATH 18 g 1   vitamin B-12 (CYANOCOBALAMIN) 1000 MCG tablet Take 1,000 mcg by mouth daily.     amoxicillin (AMOXIL) 500 MG capsule TAKE 4 CAPSULES ONE HOUR PRIOR TO DENTALPROCEDURE (Patient not taking: Reported on 04/02/2022) 30 capsule 0   brivaracetam (BRIVIACT) 100 MG TABS tablet Take 100 mg by mouth 2 (two) times daily.     gabapentin (NEURONTIN) 100 MG capsule Take 1 capsule by  mouth 3 (three) times daily.     predniSONE (DELTASONE) 10 MG tablet Take 10 mg by mouth 2 (two) times daily. (Patient not taking: Reported on 04/19/2022)     traMADol (ULTRAM) 50 MG tablet Take 50 mg by mouth every 6 (six) hours as needed.     No facility-administered medications prior to visit.    Review of Systems;  Patient denies headache, fevers, malaise, unintentional weight loss, skin rash, eye pain, sinus congestion and sinus pain, sore throat, dysphagia,  hemoptysis , cough, dyspnea, wheezing, chest pain, palpitations, orthopnea, edema, abdominal pain, nausea, melena, diarrhea, constipation, flank pain, dysuria, hematuria, urinary  Frequency, nocturia, numbness, tingling, seizures,  Focal weakness, Loss of consciousness,  Tremor, insomnia, depression, anxiety, and suicidal ideation.      Objective:  BP 102/62 (BP Location: Left Arm, Patient Position: Sitting, Cuff Size: Normal)   Pulse 85   Temp 97.8 F (36.6 C) (Oral)   Ht '5\' 4"'$  (1.626 m)   Wt 143 lb 6.4 oz (65 kg)   SpO2 99%   BMI 24.61 kg/m   BP Readings from Last 3 Encounters:  05/08/22 102/62  04/19/22 98/62  04/02/22 100/65    Wt Readings from Last 3 Encounters:  05/08/22 143 lb 6.4 oz (65 kg)  04/19/22 141 lb 6.4 oz (64.1 kg)  04/09/22 145 lb 14.4 oz (66.2 kg)    General appearance: alert, cooperative and appears stated age Ears: normal TM's and external ear canals both ears Throat: lips, mucosa, and tongue normal; teeth and gums normal Neck: no adenopathy, no carotid bruit, supple, symmetrical, trachea midline and thyroid not enlarged, symmetric, no tenderness/mass/nodules Back: symmetric, no curvature. ROM normal. No CVA tenderness. Lungs: clear to auscultation bilaterally Heart: regular rate and rhythm, S1, S2 normal, no murmur, click, rub or gallop Abdomen: soft, non-tender; bowel sounds normal; no masses,  no organomegaly Pulses: 2+ and symmetric Skin: Skin color, texture, turgor normal. No rashes or  lesions Lymph nodes: Cervical, supraclavicular, and axillary nodes normal.  Lab Results  Component Value Date   HGBA1C 5.5 02/11/2020   HGBA1C 5.7 05/21/2019   HGBA1C 5.3 04/07/2018    Lab Results  Component Value Date   CREATININE 1.09 (H) 04/02/2022   CREATININE 1.00 10/03/2021   CREATININE 0.97 09/11/2021    Lab Results  Component Value Date   WBC 4.9 04/02/2022   HGB 11.0 (L) 04/02/2022   HCT 32.9 (L) 04/02/2022   PLT 124 (L) 04/02/2022   GLUCOSE 93 04/02/2022   CHOL 143 05/21/2019   TRIG 60.0 05/21/2019   HDL 59.50 05/21/2019   LDLDIRECT 113.0 02/15/2016   LDLCALC 71 05/21/2019   ALT  13 04/02/2022   AST 21 04/02/2022   NA 134 (L) 04/02/2022   K 3.7 04/02/2022   CL 97 (L) 04/02/2022   CREATININE 1.09 (H) 04/02/2022   BUN 33 (H) 04/02/2022   CO2 26 04/02/2022   TSH 2.991 07/03/2021   INR 1.2 06/18/2021   HGBA1C 5.5 02/11/2020   MICROALBUR 2.8 (H) 11/13/2021    CT Lumbar Spine Wo Contrast  Result Date: 04/11/2022 CLINICAL DATA:  Lumbar radiculopathy EXAM: CT LUMBAR SPINE WITHOUT CONTRAST TECHNIQUE: Multidetector CT imaging of the lumbar spine was performed without intravenous contrast administration. Multiplanar CT image reconstructions were also generated. RADIATION DOSE REDUCTION: This exam was performed according to the departmental dose-optimization program which includes automated exposure control, adjustment of the mA and/or kV according to patient size and/or use of iterative reconstruction technique. COMPARISON:  Lumbar spine radiographs 04/12/2021 FINDINGS: Segmentation: 5 lumbar segments Alignment: 3 mm retrolisthesis T12-L1. 6 mm retrolisthesis L1-2. 4 mm retrolisthesis L2-2 3. 8 mm anterolisthesis L4-5. 4 mm anterolisthesis L5-S1 Marked dextroscoliosis approximately 45 degrees convex to the right at L2 Vertebrae: Negative for spinal fracture, mass, or infection. Bilateral pars defects of L5 as described below. Chronic healed fracture of the right eleventh  rib posteriorly. Displaced fracture of the right twelfth rib. There is some early callus formation and this appears to be a recent fracture. Paraspinal and other soft tissues: Atherosclerotic calcification in the aorta and iliacs. Negative for aneurysm. No retroperitoneal mass or adenopathy. Multiple hepatic cysts as noted on prior studies. Bilateral renal cysts. Disc levels: T12-L1: Asymmetric disc degeneration on the left with severe disc space narrowing on the left and associated spurring. Mild left foraminal narrowing. L1-2: Asymmetric disc degeneration on the left which is severe with marked disc space narrowing, endplate sclerosis, and spurring. Moderate left foraminal narrowing. Spinal canal adequate in diameter. L2-3: Asymmetric disc degeneration on the left with mild associated spurring. Bilateral facet hypertrophy. Right lateral subluxation of L2 on L3 due to scoliosis. Mild spinal stenosis. Mild foraminal narrowing bilaterally. L3-4: Right lateral subluxation of L3 on L4. Mild disc degeneration and moderate facet degeneration. Moderate foraminal encroachment on the right. Left foramen patent. L4-5: Left lateral subluxation of L4-L5. Severe facet degeneration bilaterally. Asymmetric disc degeneration on the right with disc space narrowing. 8 mm anterolisthesis. Moderate spinal stenosis. Moderate to severe subarticular stenosis on the right and mild left subarticular stenosis L5-S1: 4 mm anterolisthesis. Bilateral pars defects of L5. The right pars defect was present on CT abdomen pelvis 2016. The left pars defect was not present at that time in may be recent. Spinal canal widely patent. Neural foramina patent bilaterally. IMPRESSION: 1. 45 degrees of dextroscoliosis in the lumbar spine. 2. Multilevel disc and facet degeneration with spinal and foraminal stenosis as described above. 3. Multilevel spondylolisthesis. Lateral subluxation at L2-3, L3-4, L4-5 due to scoliosis. 4. Bilateral pars defects of L5.  The right pars defect is chronic and was present in 2016 however the left pars defect but not present 2016 and may be recent. 5. Healed fracture right eleventh rib. Displaced fracture right twelfth ribs with early callus formation. Correlate with injury. Electronically Signed   By: Franchot Gallo M.D.   On: 04/11/2022 13:51    Assessment & Plan:   Problem List Items Addressed This Visit     Anxiety associated with depression (Chronic)    Her mood seems more anxious for the past year due to her mounting health issues and her regret over selling her home.   She  is using alprazolam prn and fioricet less than once daily.  No changes today       Acquired thrombophilia ([place])    Managed with use of Eliquis for embolic stroke risk mitigation due to  atrial fibrillation. Patient has no signs of bleeding and is advised to notify her specialists prior to any procedure that may required suspension of Eliquis       Chronic kidney disease (CKD) stage G3a/A2, moderately decreased glomerular filtration rate (GFR) between 45-59 mL/min/1.73 square meter and albuminuria creatinine ratio between 30-299 mg/g ([place])    She has been screened for MM and renal ultrasound showed cortical thinning.  She does not wasn't to see a nephrologist at this time .  She attributes the decline in GFR to lamictal.   GFR is stable      Lumbar radiculitis    manged suboptimally with tylenol,  Prn tramadol.  Scheduled for ESI by Dr Loistine Chance soon.       Relevant Medications   gabapentin (NEURONTIN) 100 MG capsule   Seizure disorder ([place])    Secondary to right temporal lobe focus, Now managed with Breviact and lamictal. Episodes of parasthesias are often triggered by medication non adherence and use of triggering medications .        Relevant Medications   gabapentin (NEURONTIN) 100 MG capsule   Thrombocytopenia (HCC)    Stable , unaffected by statin suspension .statin resumed     Lab Results  Component Value Date   WBC 4.9  04/02/2022   HGB 11.0 (L) 04/02/2022   HCT 32.9 (L) 04/02/2022   MCV 93.2 04/02/2022   PLT 124 (L) 04/02/2022          I spent a total of   35  minutes with this patient in a face to face visit on the date of this encounter reviewing the last office visit with me ,  her most recent  cardiologist evaluation ,  patient's  current emotional state , dietary habits and assessment of ADL's,  most recentt CT scan ,  and post visit ordering of testing and therapeutics.    Follow-up: Return in about 4 weeks (around 06/05/2022).   [name], MD

## 2022-05-09 ENCOUNTER — Encounter: Payer: Self-pay | Admitting: Internal Medicine

## 2022-05-09 DIAGNOSIS — M5416 Radiculopathy, lumbar region: Secondary | ICD-10-CM | POA: Insufficient documentation

## 2022-05-09 NOTE — Assessment & Plan Note (Signed)
manged suboptimally with tylenol,  Prn tramadol.  Scheduled for ESI by Dr Loistine Chance soon.

## 2022-05-09 NOTE — Assessment & Plan Note (Signed)
Her mood seems more anxious for the past year due to her mounting health issues and her regret over selling her home.   She is using alprazolam prn and fioricet less than once daily.  No changes today

## 2022-05-09 NOTE — Assessment & Plan Note (Signed)
Managed with use of Eliquis for embolic stroke risk mitigation due to  atrial fibrillation. Patient has no signs of bleeding and is advised to notify her specialists prior to any procedure that may required suspension of Eliquis

## 2022-05-09 NOTE — Assessment & Plan Note (Addendum)
Secondary to right temporal lobe focus, Now managed with Breviact and lamictal. Episodes of parasthesias are often triggered by medication non adherence and use of triggering medications .

## 2022-05-09 NOTE — Assessment & Plan Note (Signed)
She has been screened for MM and renal ultrasound showed cortical thinning.  She does not wasn't to see a nephrologist at this time .  She attributes the decline in GFR to lamictal.   GFR is stable

## 2022-05-09 NOTE — Assessment & Plan Note (Signed)
Stable , unaffected by statin suspension .statin resumed     Lab Results  Component Value Date   WBC 4.9 04/02/2022   HGB 11.0 (L) 04/02/2022   HCT 32.9 (L) 04/02/2022   MCV 93.2 04/02/2022   PLT 124 (L) 04/02/2022

## 2022-05-10 ENCOUNTER — Other Ambulatory Visit: Payer: Self-pay | Admitting: Family

## 2022-05-13 ENCOUNTER — Encounter: Payer: Self-pay | Admitting: Cardiovascular Disease

## 2022-05-14 ENCOUNTER — Ambulatory Visit: Payer: Medicare Other | Admitting: Internal Medicine

## 2022-05-22 ENCOUNTER — Ambulatory Visit (INDEPENDENT_AMBULATORY_CARE_PROVIDER_SITE_OTHER): Payer: Medicare Other

## 2022-05-22 DIAGNOSIS — M810 Age-related osteoporosis without current pathological fracture: Secondary | ICD-10-CM | POA: Diagnosis not present

## 2022-05-22 DIAGNOSIS — M8000XS Age-related osteoporosis with current pathological fracture, unspecified site, sequela: Secondary | ICD-10-CM

## 2022-05-22 MED ORDER — DENOSUMAB 60 MG/ML ~~LOC~~ SOSY
60.0000 mg | PREFILLED_SYRINGE | Freq: Once | SUBCUTANEOUS | Status: AC
  Administered 2022-05-22: 60 mg via SUBCUTANEOUS

## 2022-05-22 NOTE — Progress Notes (Cosign Needed)
Patient came in today for Prolia injection given in left arm SQ. Patient tolerated well with no signs of distress.  °

## 2022-05-27 ENCOUNTER — Encounter: Payer: Self-pay | Admitting: Internal Medicine

## 2022-05-28 ENCOUNTER — Ambulatory Visit (INDEPENDENT_AMBULATORY_CARE_PROVIDER_SITE_OTHER): Payer: Medicare Other

## 2022-05-28 DIAGNOSIS — I442 Atrioventricular block, complete: Secondary | ICD-10-CM | POA: Diagnosis not present

## 2022-05-29 LAB — CUP PACEART REMOTE DEVICE CHECK
Battery Impedance: 885 Ohm
Battery Remaining Longevity: 57 mo
Battery Voltage: 2.79 V
Brady Statistic AP VP Percent: 83 %
Brady Statistic AP VS Percent: 0 %
Brady Statistic AS VP Percent: 17 %
Brady Statistic AS VS Percent: 0 %
Date Time Interrogation Session: 20230627194910
Implantable Lead Implant Date: 20161205
Implantable Lead Implant Date: 20170321
Implantable Lead Location: 753859
Implantable Lead Location: 753860
Implantable Lead Model: 5076
Implantable Lead Model: 5076
Implantable Pulse Generator Implant Date: 20161205
Lead Channel Impedance Value: 446 Ohm
Lead Channel Impedance Value: 746 Ohm
Lead Channel Pacing Threshold Amplitude: 0.625 V
Lead Channel Pacing Threshold Amplitude: 0.625 V
Lead Channel Pacing Threshold Pulse Width: 0.4 ms
Lead Channel Pacing Threshold Pulse Width: 0.4 ms
Lead Channel Setting Pacing Amplitude: 2 V
Lead Channel Setting Pacing Amplitude: 2.5 V
Lead Channel Setting Pacing Pulse Width: 0.4 ms
Lead Channel Setting Sensing Sensitivity: 4 mV

## 2022-06-16 ENCOUNTER — Other Ambulatory Visit: Payer: Self-pay | Admitting: Internal Medicine

## 2022-06-16 DIAGNOSIS — M5431 Sciatica, right side: Secondary | ICD-10-CM

## 2022-06-19 NOTE — Progress Notes (Signed)
Remote pacemaker transmission.   

## 2022-06-22 ENCOUNTER — Emergency Department: Payer: Medicare Other

## 2022-06-22 ENCOUNTER — Emergency Department
Admission: EM | Admit: 2022-06-22 | Discharge: 2022-06-23 | Disposition: A | Discharge status: Home / Self Care | Payer: Medicare Other | Attending: Emergency Medicine | Admitting: Emergency Medicine

## 2022-06-22 ENCOUNTER — Other Ambulatory Visit: Payer: Self-pay

## 2022-06-22 DIAGNOSIS — M25551 Pain in right hip: Secondary | ICD-10-CM | POA: Insufficient documentation

## 2022-06-22 DIAGNOSIS — W19XXXA Unspecified fall, initial encounter: Secondary | ICD-10-CM | POA: Insufficient documentation

## 2022-06-22 DIAGNOSIS — N189 Chronic kidney disease, unspecified: Secondary | ICD-10-CM | POA: Diagnosis not present

## 2022-06-22 DIAGNOSIS — Z7901 Long term (current) use of anticoagulants: Secondary | ICD-10-CM | POA: Insufficient documentation

## 2022-06-22 DIAGNOSIS — W1839XA Other fall on same level, initial encounter: Secondary | ICD-10-CM | POA: Diagnosis not present

## 2022-06-22 DIAGNOSIS — S0003XA Contusion of scalp, initial encounter: Secondary | ICD-10-CM | POA: Diagnosis not present

## 2022-06-22 DIAGNOSIS — Y92009 Unspecified place in unspecified non-institutional (private) residence as the place of occurrence of the external cause: Secondary | ICD-10-CM | POA: Diagnosis not present

## 2022-06-22 DIAGNOSIS — S0990XA Unspecified injury of head, initial encounter: Secondary | ICD-10-CM | POA: Diagnosis present

## 2022-06-22 DIAGNOSIS — Y9301 Activity, walking, marching and hiking: Secondary | ICD-10-CM | POA: Diagnosis not present

## 2022-06-22 DIAGNOSIS — I4891 Unspecified atrial fibrillation: Secondary | ICD-10-CM | POA: Diagnosis not present

## 2022-06-22 DIAGNOSIS — I509 Heart failure, unspecified: Secondary | ICD-10-CM | POA: Diagnosis not present

## 2022-06-22 NOTE — ED Notes (Signed)
[facility] sent to lab on pt
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
## 2022-06-22 NOTE — ED Triage Notes (Signed)
First RN Note: Pt to ED via POV, pt ambulatory to triage desk, states fell earlier today and landed on R hip, head, and leg. Pt states states broke her femur last July and was told that if she fell she was to get X-ray to ensure no further injury.
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
## 2022-06-22 NOTE — Discharge Instructions (Signed)
Your CT scan of the head and x-ray of the right hip and pelvis are both okay today.  The swelling on the right side of your scalp can be expected to resolve over the next few days.  In the meantime, the bruising on your scalp may appear to spread, but this is not a problem unless the area of swelling gets much larger.

## 2022-06-26 ENCOUNTER — Encounter: Payer: Self-pay | Admitting: Internal Medicine

## 2022-06-26 ENCOUNTER — Ambulatory Visit (INDEPENDENT_AMBULATORY_CARE_PROVIDER_SITE_OTHER): Payer: Medicare Other

## 2022-06-26 ENCOUNTER — Ambulatory Visit (INDEPENDENT_AMBULATORY_CARE_PROVIDER_SITE_OTHER): Payer: Medicare Other | Admitting: Internal Medicine

## 2022-06-26 VITALS — BP 102/60 | HR 82 | Temp 99.1°F | Ht 65.0 in | Wt 145.8 lb

## 2022-06-26 DIAGNOSIS — R1031 Right lower quadrant pain: Secondary | ICD-10-CM | POA: Diagnosis not present

## 2022-06-26 DIAGNOSIS — F418 Other specified anxiety disorders: Secondary | ICD-10-CM

## 2022-06-26 DIAGNOSIS — D696 Thrombocytopenia, unspecified: Secondary | ICD-10-CM

## 2022-06-26 DIAGNOSIS — W19XXXA Unspecified fall, initial encounter: Secondary | ICD-10-CM

## 2022-06-26 DIAGNOSIS — I6523 Occlusion and stenosis of bilateral carotid arteries: Secondary | ICD-10-CM | POA: Diagnosis not present

## 2022-06-26 DIAGNOSIS — D509 Iron deficiency anemia, unspecified: Secondary | ICD-10-CM

## 2022-06-26 DIAGNOSIS — Y92009 Unspecified place in unspecified non-institutional (private) residence as the place of occurrence of the external cause: Secondary | ICD-10-CM

## 2022-06-26 DIAGNOSIS — I700 Atherosclerosis of aorta: Secondary | ICD-10-CM

## 2022-06-26 DIAGNOSIS — N1831 Chronic kidney disease, stage 3a: Secondary | ICD-10-CM | POA: Diagnosis not present

## 2022-06-26 DIAGNOSIS — E559 Vitamin D deficiency, unspecified: Secondary | ICD-10-CM | POA: Diagnosis not present

## 2022-06-26 LAB — BASIC METABOLIC PANEL
BUN: 22 mg/dL (ref 6–23)
CO2: 27 mEq/L (ref 19–32)
Calcium: 8.9 mg/dL (ref 8.4–10.5)
Chloride: 101 mEq/L (ref 96–112)
Creatinine, Ser: 1.06 mg/dL (ref 0.40–1.20)
GFR: 49.2 mL/min — ABNORMAL LOW (ref >60.00)
Glucose, Bld: 86 mg/dL (ref 70–99)
Potassium: 4 mEq/L (ref 3.5–5.1)
Sodium: 135 mEq/L (ref 135–145)

## 2022-06-26 LAB — CBC WITH DIFFERENTIAL/PLATELET
Basophils Absolute: 0 10*3/uL (ref 0.0–0.1)
Basophils Relative: 0.7 % (ref 0.0–3.0)
Eosinophils Absolute: 0.1 10*3/uL (ref 0.0–0.7)
Eosinophils Relative: 2.6 % (ref 0.0–5.0)
HCT: 29.5 % — ABNORMAL LOW (ref 36.0–46.0)
Hemoglobin: 10.1 g/dL — ABNORMAL LOW (ref 12.0–15.0)
Lymphocytes Relative: 23.6 % (ref 12.0–46.0)
Lymphs Abs: 1.3 10*3/uL (ref 0.7–4.0)
MCHC: 34.3 g/dL (ref 30.0–36.0)
MCV: 95.3 fl (ref 78.0–100.0)
Monocytes Absolute: 0.5 10*3/uL (ref 0.1–1.0)
Monocytes Relative: 9.1 % (ref 3.0–12.0)
Neutro Abs: 3.6 10*3/uL (ref 1.4–7.7)
Neutrophils Relative %: 64 % (ref 43.0–77.0)
Platelets: 128 10*3/uL — ABNORMAL LOW (ref 150.0–400.0)
RBC: 3.09 Mil/uL — ABNORMAL LOW (ref 3.87–5.11)
RDW: 15.2 % (ref 11.5–15.5)
WBC: 5.6 10*3/uL (ref 4.0–10.5)

## 2022-06-26 LAB — VITAMIN D 25 HYDROXY (VIT D DEFICIENCY, FRACTURES): VITD: 56.71 ng/mL (ref 30.00–100.00)

## 2022-06-26 NOTE — Assessment & Plan Note (Signed)
Will repeat imaging of pelvis , hip and femur to ensure no fracture.  ROM not tested due to concern for occult fracture.  Will offer PT once fracture is ruled out.  She has been weight bearing with the aid of a walker since the fall on July 22

## 2022-06-26 NOTE — Progress Notes (Signed)
Subjective:  Patient ID: [name] [name], female    DOB: 05-23-1941  Age: 81 y.o. MRN: [id number]  CC: The primary encounter diagnosis was Groin pain, right. Diagnoses of Thrombocytopenia ([place]), Chronic kidney disease (CKD) stage G3a/A2, moderately decreased glomerular filtration rate (GFR) between 45-59 mL/min/1.73 square meter and albuminuria creatinine ratio between 30-299 mg/g (HCC), Vitamin D deficiency, Right groin pain, Anxiety associated with depression, Thoracic aorta atherosclerosis ([place]), Iron deficiency anemia, unspecified iron deficiency anemia type, and Fall as cause of accidental injury in home as place of occurrence, initial encounter were also pertinent to this visit.   HPI [name] [name] presents for ER follow up after a fall . Chief Complaint  Patient presents with   Fall    ED follow up from a recent fall. Still having pain in the groin.   63  81 yr old female with osteoporosis,  treated with bisphosphonates until  history of left femur fracture  one year ago ,  presents with right groin pain after sustaining a fall that occurred on July 22 while walking near her home. She was inadvertently knocked off balance by her special needs granddaughter while they were leaving the house and she was closing the front door.  Fell onto her Right hip ,  and had blunt trauma to the right side of head.  No LOC . She was able to get up without assistance and went to dinner with family.  Afterward went to ER via [facility].  Head and pelvis   imaged by ER with no  fractures of femur, pelvis or cranium.  CT head noted a scalp hematoma   She developed right sided  groin pain on Sunday July  23 .nO bruising of inner thigh.   Has been using fioricet prn pain , muscle relaxer . No weakness in right leg.  However the tips of left hand fingers have become numb and hypersensitive. Some stiffness of the neck which is not new.   Denies bruising of groin,  hematuria.  Was   Left leg was not injured.      Outpatient Medications Prior to Visit  Medication Sig Dispense Refill   apixaban (ELIQUIS) 5 MG TABS tablet TAKE ONE TABLET TWICE DAILY 180 tablet 2   BRIVIACT 50 MG TABS Take 1 tablet by mouth 2 (two) times daily.     butalbital-acetaminophen-caffeine (FIORICET) 50-325-40 MG tablet TAKE 1 TABLET BY MOUTH ONCE DAILY AS NEEDED FOR BACK PAIN OR MIGRAINE *MAX OF 3 TABS PER DAY* 90 tablet 0   Cholecalciferol (VITAMIN D) 2000 units CAPS Take 2,000 Units by mouth daily.     fluticasone (FLONASE) 50 MCG/ACT nasal spray USE 2 SPRAYS IN BOTH NOSTRILS DAILY 16 g 11   Folic Acid 0.8 MG CAPS Take 0.8 mg by mouth daily.     furosemide (LASIX) 40 MG tablet Take one tablet twice per week. Take an additional 0.5-1 tablet as needed for fluid retention. 30 tablet 11   gabapentin (NEURONTIN) 100 MG capsule Take by mouth.     hydroquinone 4 % cream Apply topically in the morning and at bedtime.     lamoTRIgine (LAMICTAL) 150 MG tablet Take 200 mg by mouth 2 (two) times daily.     lamoTRIgine (LAMICTAL) 200 MG tablet Take 150 mg in the am & 200 mg in the pm.     losartan (COZAAR) 25 MG tablet TAKE ONE-HALF (1/2) TABLET BY MOUTH EVERY DAY 45 tablet 2   metaxalone (SKELAXIN) 800 MG tablet  TAKE ONE TABLET BY MOUTH 3 TIMES DAILY 90 tablet 1   metoprolol succinate (TOPROL-XL) 25 MG 24 hr tablet TAKE 1 TABLET BY MOUTH DAILY 90 tablet 2   montelukast (SINGULAIR) 10 MG tablet TAKE ONE TABLET BY MOUTH AT BEDTIME 90 tablet 1   polyethylene glycol powder (GLYCOLAX/MIRALAX) 17 GM/SCOOP powder Take 1 Dose by mouth daily.     rosuvastatin (CRESTOR) 40 MG tablet TAKE ONE TABLET EVERY DAY 90 tablet 1   spironolactone (ALDACTONE) 25 MG tablet TAKE 1/2 TABLET EVERY DAY (Patient taking differently: Take 25 mg by mouth every other day.) 45 tablet 2   VENTOLIN HFA 108 (90 Base) MCG/ACT inhaler INHALE 2 PUFFS INTO THE LUNGS EVERY 6 HOURS AS NEEDED FOR WHEEZING OR SHORTNESS OF BREATH 18 g 1   vitamin B-12 (CYANOCOBALAMIN) 1000 MCG  tablet Take 1,000 mcg by mouth daily.     amoxicillin (AMOXIL) 500 MG capsule TAKE 4 CAPSULES ONE HOUR PRIOR TO DENTALPROCEDURE (Patient not taking: Reported on 06/26/2022) 30 capsule 0   No facility-administered medications prior to visit.    Review of Systems;  Patient denies headache, fevers, malaise, unintentional weight loss, skin rash, eye pain, sinus congestion and sinus pain, sore throat, dysphagia,  hemoptysis , cough, dyspnea, wheezing, chest pain, palpitations, orthopnea, edema, abdominal pain, nausea, melena, diarrhea, constipation, flank pain, dysuria, hematuria, urinary  Frequency, nocturia, numbness, tingling, seizures,  Focal weakness, Loss of consciousness,  Tremor, insomnia, depression, anxiety, and suicidal ideation.      Objective:  BP 102/60 (BP Location: Left Arm, Patient Position: Sitting, Cuff Size: Normal)   Pulse 82   Temp 99.1 F (37.3 C) (Oral)   Ht '5\' 5"'$  (1.651 m)   Wt 145 lb 12.8 oz (66.1 kg)   SpO2 96%   BMI 24.26 kg/m   BP Readings from Last 3 Encounters:  06/26/22 102/60  06/22/22 133/89  05/08/22 102/62    Wt Readings from Last 3 Encounters:  06/26/22 145 lb 12.8 oz (66.1 kg)  06/22/22 140 lb (63.5 kg)  05/08/22 143 lb 6.4 oz (65 kg)    General appearance: alert, cooperative and appears stated age Right eye ecchymosis  Ears: normal TM's and external ear canals both ears Throat: lips, mucosa, and tongue normal; teeth and gums normal Neck: no adenopathy, no carotid bruit, supple, symmetrical, trachea midline and thyroid not enlarged, symmetric, no tenderness/mass/nodules Back: symmetric, no curvature. ROM normal. No CVA tenderness. Lungs: clear to auscultation bilaterally Heart: regular rate and rhythm, S1, S2 normal, no murmur, click, rub or gallop Abdomen: soft, non-tender; bowel sounds normal; no masses,  no organomegaly Pulses: 2+ and symmetric  in radials and DP's  Skin: Skin color, texture, turgor normal. No rashes or lesions Lymph  nodes: Cervical, supraclavicular, and axillary nodes normal. MSK:  normal Phalen's/Tinel sn-gn.   Lab Results  Component Value Date   HGBA1C 5.5 02/11/2020   HGBA1C 5.7 05/21/2019   HGBA1C 5.3 04/07/2018    Lab Results  Component Value Date   CREATININE 1.09 (H) 04/02/2022   CREATININE 1.00 10/03/2021   CREATININE 0.97 09/11/2021    Lab Results  Component Value Date   WBC 4.9 04/02/2022   HGB 11.0 (L) 04/02/2022   HCT 32.9 (L) 04/02/2022   PLT 124 (L) 04/02/2022   GLUCOSE 93 04/02/2022   CHOL 143 05/21/2019   TRIG 60.0 05/21/2019   HDL 59.50 05/21/2019   LDLDIRECT 113.0 02/15/2016   LDLCALC 71 05/21/2019   ALT 13 04/02/2022   AST 21 04/02/2022  NA 134 (L) 04/02/2022   K 3.7 04/02/2022   CL 97 (L) 04/02/2022   CREATININE 1.09 (H) 04/02/2022   BUN 33 (H) 04/02/2022   CO2 26 04/02/2022   TSH 2.991 07/03/2021   INR 1.2 06/18/2021   HGBA1C 5.5 02/11/2020   MICROALBUR 2.8 (H) 11/13/2021    DG Hip Unilat  With Pelvis 2-3 Views Right  Result Date: 06/22/2022 CLINICAL DATA:  Pain EXAM: DG HIP (WITH OR WITHOUT PELVIS) 2-3V RIGHT COMPARISON:  None Available. FINDINGS: No fracture or dislocation is seen. Status post ORIF of the left hip. Bilateral hip joint spaces are preserved. Visualized bony pelvis appears intact. IMPRESSION: Negative. Electronically Signed   By: Julian Hy M.D.   On: 06/22/2022 22:15   CT HEAD WO CONTRAST (5MM)  Result Date: 06/22/2022 CLINICAL DATA:  Facial trauma, blunt EXAM: CT HEAD WITHOUT CONTRAST TECHNIQUE: Contiguous axial images were obtained from the base of the skull through the vertex without intravenous contrast. RADIATION DOSE REDUCTION: This exam was performed according to the departmental dose-optimization program which includes automated exposure control, adjustment of the mA and/or kV according to patient size and/or use of iterative reconstruction technique. COMPARISON:  CT angiography head 09/28/2018 FINDINGS: Brain: No evidence of  large-territorial acute infarction. No parenchymal hemorrhage. No mass lesion. No extra-axial collection. No mass effect or midline shift. No hydrocephalus. Basilar cisterns are patent. Vascular: No hyperdense vessel. Atherosclerotic calcifications are present within the cavernous internal carotid arteries. Skull: No acute fracture or focal lesion. Sinuses/Orbits: Paranasal sinuses and mastoid air cells are clear. Right lens replacement. Otherwise the orbits are unremarkable. Other: Right frontal scalp 7 mm hematoma formation. IMPRESSION: No acute intracranial abnormality. Electronically Signed   By: Iven Finn M.D.   On: 06/22/2022 21:20    Assessment & Plan:   Problem List Items Addressed This Visit     Thoracic aorta atherosclerosis ([place]) (Chronic)    Continue high potency statin.       Thrombocytopenia ([place])   Relevant Orders   CBC with Differential/Platelet   Anemia    Stable,  History of b12 and iron deficiency,  CKD .  On anticoagulant.  Rechecking today       Fall as cause of accidental injury at home as place of occurrence    SHE was accidentally knocked over by her granddaughter on July 22 and was evaluated in ER with right hip /feamur films and head CT.Marland Kitchen Repeating plain films today to rule out occult fracture       Chronic kidney disease (CKD) stage G3a/A2, moderately decreased glomerular filtration rate (GFR) between 45-59 mL/min/1.73 square meter and albuminuria creatinine ratio between 30-299 mg/g (HCC)   Relevant Orders   Basic metabolic panel   Right groin pain    Will repeat imaging of pelvis , hip and femur to ensure no fracture.  ROM not tested due to concern for occult fracture.  Will offer PT once fracture is ruled out.  She has been weight bearing with the aid of a walker since the fall on July 22      RESOLVED: Anxiety associated with depression (Chronic)   Other Visit Diagnoses     Groin pain, right    -  Primary   Relevant Orders   DG Hip Unilat W OR  W/O Pelvis 2-3 Views Right   DG FEMUR, MIN 2 VIEWS RIGHT   Vitamin D deficiency       Relevant Orders   VITAMIN D 25 Hydroxy (Vit-D Deficiency, Fractures)  I spent a total of  40 minutes with this patient in a face to face visit on the date of this encounter reviewing the last office visit with me ,  her recent ER visit,  most recent imaging studies,, and  post visit ordering of testing and therapeutics.    Follow-up: No follow-ups on file.   [name], MD

## 2022-06-26 NOTE — Patient Instructions (Signed)
Repeat x rays of hip, pelvis and femur today  If you would like PT,  let me know

## 2022-06-26 NOTE — Assessment & Plan Note (Signed)
Continue high potency statin.

## 2022-06-26 NOTE — Assessment & Plan Note (Addendum)
Stable,  History of b12 and iron deficiency,  CKD .  On anticoagulant.  Rechecking today

## 2022-06-26 NOTE — Assessment & Plan Note (Signed)
SHE was accidentally knocked over by her granddaughter on July 22 and was evaluated in ER with right hip /feamur films and head CT.Marland Kitchen Repeating plain films today to rule out occult fracture

## 2022-06-28 ENCOUNTER — Encounter: Payer: Self-pay | Admitting: Internal Medicine

## 2022-06-28 ENCOUNTER — Telehealth: Payer: Self-pay | Admitting: Internal Medicine

## 2022-06-28 NOTE — Telephone Encounter (Signed)
The x ray reports are very deatiled and can be viewed in mychart  There are no new fractures of bones  ,  but 2 of the screws in the femur appear to have fractured, (compared to July 2022 films)    "There is new fracturing of the two proximal screws compared to 06/18/2021 radiographs.   But the hardware is still in place.  I would follow up with Orthopedist t review the films.

## 2022-06-28 NOTE — Telephone Encounter (Signed)
Pt called wanting to know if the provider has reviewed the xray results yet. Pt would like to be called

## 2022-06-28 NOTE — Telephone Encounter (Signed)
Spoke with pt and she stated that she is still having the right side groin pain that is stopping her from being able to get around. Pt stated that she can sit and stand if she does not put weight on the right leg for as long as she wants too she just has a very hard time "getting from point A to point B." She also stated that she was aware of the screws that were broken back in 2022 and she stated that she has been told there is nothing she can do about those.

## 2022-06-28 NOTE — Telephone Encounter (Signed)
Pt requesting xray results

## 2022-06-30 ENCOUNTER — Other Ambulatory Visit: Payer: Self-pay | Admitting: Internal Medicine

## 2022-06-30 DIAGNOSIS — N1831 Chronic kidney disease, stage 3a: Secondary | ICD-10-CM

## 2022-06-30 DIAGNOSIS — D509 Iron deficiency anemia, unspecified: Secondary | ICD-10-CM

## 2022-07-09 ENCOUNTER — Other Ambulatory Visit: Payer: Self-pay | Admitting: Orthopedic Surgery

## 2022-07-09 ENCOUNTER — Other Ambulatory Visit: Payer: Self-pay | Admitting: Family

## 2022-07-09 ENCOUNTER — Other Ambulatory Visit: Payer: Self-pay | Admitting: Internal Medicine

## 2022-07-09 DIAGNOSIS — I5022 Chronic systolic (congestive) heart failure: Secondary | ICD-10-CM

## 2022-07-09 DIAGNOSIS — M25551 Pain in right hip: Secondary | ICD-10-CM

## 2022-07-09 NOTE — Telephone Encounter (Signed)
Rx(s) sent to pharmacy electronically.  

## 2022-07-10 ENCOUNTER — Ambulatory Visit
Admission: RE | Admit: 2022-07-10 | Discharge: 2022-07-10 | Disposition: A | Discharge status: Home / Self Care | Payer: Medicare Other | Source: Ambulatory Visit | Attending: Orthopedic Surgery | Admitting: Orthopedic Surgery

## 2022-07-10 DIAGNOSIS — M25551 Pain in right hip: Secondary | ICD-10-CM | POA: Diagnosis present

## 2022-07-18 ENCOUNTER — Other Ambulatory Visit: Payer: Self-pay | Admitting: Student

## 2022-07-18 ENCOUNTER — Other Ambulatory Visit (HOSPITAL_COMMUNITY): Payer: Self-pay | Admitting: Student

## 2022-07-18 DIAGNOSIS — S0990XA Unspecified injury of head, initial encounter: Secondary | ICD-10-CM

## 2022-07-24 ENCOUNTER — Ambulatory Visit
Admission: RE | Admit: 2022-07-24 | Discharge: 2022-07-24 | Disposition: A | Discharge status: Home / Self Care | Payer: Medicare Other | Source: Ambulatory Visit | Attending: Student | Admitting: Student

## 2022-07-24 DIAGNOSIS — S0990XA Unspecified injury of head, initial encounter: Secondary | ICD-10-CM | POA: Diagnosis present

## 2022-07-29 ENCOUNTER — Ambulatory Visit: Payer: Medicare Other | Admitting: Internal Medicine

## 2022-08-08 ENCOUNTER — Other Ambulatory Visit: Payer: Self-pay

## 2022-08-08 ENCOUNTER — Other Ambulatory Visit: Payer: Self-pay | Admitting: Internal Medicine

## 2022-08-08 DIAGNOSIS — I4819 Other persistent atrial fibrillation: Secondary | ICD-10-CM

## 2022-08-08 DIAGNOSIS — I5022 Chronic systolic (congestive) heart failure: Secondary | ICD-10-CM

## 2022-08-08 MED ORDER — METOPROLOL SUCCINATE ER 25 MG PO TB24: 25.0000 mg | ORAL_TABLET | Freq: Every day | ORAL | 0 refills | Status: DC

## 2022-08-15 ENCOUNTER — Other Ambulatory Visit: Payer: Self-pay | Admitting: Internal Medicine

## 2022-08-15 DIAGNOSIS — M5431 Sciatica, right side: Secondary | ICD-10-CM

## 2022-08-18 ENCOUNTER — Encounter: Payer: Self-pay | Admitting: Internal Medicine

## 2022-08-20 NOTE — Telephone Encounter (Signed)
Pt is scheduled for an appt with you on 08/26/2022.

## 2022-08-23 ENCOUNTER — Other Ambulatory Visit (INDEPENDENT_AMBULATORY_CARE_PROVIDER_SITE_OTHER): Payer: Medicare Other

## 2022-08-23 DIAGNOSIS — D631 Anemia in chronic kidney disease: Secondary | ICD-10-CM

## 2022-08-23 DIAGNOSIS — N1831 Chronic kidney disease, stage 3a: Secondary | ICD-10-CM

## 2022-08-23 DIAGNOSIS — D509 Iron deficiency anemia, unspecified: Secondary | ICD-10-CM

## 2022-08-23 LAB — IBC + FERRITIN
Ferritin: 37.8 ng/mL (ref 10.0–291.0)
Iron: 63 ug/dL (ref 42–145)
Saturation Ratios: 18.1 % — ABNORMAL LOW (ref 20.0–50.0)
TIBC: 348.6 ug/dL (ref 250.0–450.0)
Transferrin: 249 mg/dL (ref 212.0–360.0)

## 2022-08-23 LAB — VITAMIN B12: Vitamin B-12: >1500 pg/mL — ABNORMAL HIGH (ref 211–911)

## 2022-08-26 LAB — ERYTHROPOIETIN: Erythropoietin: 26 m[IU]/mL — ABNORMAL HIGH (ref 2.6–18.5)

## 2022-08-27 ENCOUNTER — Ambulatory Visit (INDEPENDENT_AMBULATORY_CARE_PROVIDER_SITE_OTHER): Payer: Medicare Other

## 2022-08-27 DIAGNOSIS — I495 Sick sinus syndrome: Secondary | ICD-10-CM

## 2022-08-28 ENCOUNTER — Ambulatory Visit (INDEPENDENT_AMBULATORY_CARE_PROVIDER_SITE_OTHER): Payer: Medicare Other | Admitting: Internal Medicine

## 2022-08-28 ENCOUNTER — Encounter: Payer: Self-pay | Admitting: Internal Medicine

## 2022-08-28 VITALS — BP 102/56 | HR 54 | Temp 97.5°F | Ht 65.0 in | Wt 146.6 lb

## 2022-08-28 DIAGNOSIS — R1031 Right lower quadrant pain: Secondary | ICD-10-CM

## 2022-08-28 DIAGNOSIS — Z77120 Contact with and (suspected) exposure to mold (toxic): Secondary | ICD-10-CM | POA: Diagnosis not present

## 2022-08-28 DIAGNOSIS — S32591S Other specified fracture of right pubis, sequela: Secondary | ICD-10-CM

## 2022-08-28 DIAGNOSIS — Z79899 Other long term (current) drug therapy: Secondary | ICD-10-CM

## 2022-08-28 DIAGNOSIS — I6523 Occlusion and stenosis of bilateral carotid arteries: Secondary | ICD-10-CM | POA: Diagnosis not present

## 2022-08-28 DIAGNOSIS — Z23 Encounter for immunization: Secondary | ICD-10-CM | POA: Diagnosis not present

## 2022-08-28 DIAGNOSIS — S32591A Other specified fracture of right pubis, initial encounter for closed fracture: Secondary | ICD-10-CM

## 2022-08-28 DIAGNOSIS — B3789 Other sites of candidiasis: Secondary | ICD-10-CM

## 2022-08-28 DIAGNOSIS — S32592S Other specified fracture of left pubis, sequela: Secondary | ICD-10-CM

## 2022-08-28 HISTORY — DX: Other specified fracture of right pubis, initial encounter for closed fracture: S32.591A

## 2022-08-28 LAB — CUP PACEART REMOTE DEVICE CHECK
Battery Impedance: 938 Ohm
Battery Remaining Longevity: 55 mo
Battery Voltage: 2.78 V
Brady Statistic AP VP Percent: 84 %
Brady Statistic AP VS Percent: 0 %
Brady Statistic AS VP Percent: 16 %
Brady Statistic AS VS Percent: 0 %
Date Time Interrogation Session: 20230926082904
Implantable Lead Implant Date: 20161205
Implantable Lead Implant Date: 20170321
Implantable Lead Location: 753859
Implantable Lead Location: 753860
Implantable Lead Model: 5076
Implantable Lead Model: 5076
Implantable Pulse Generator Implant Date: 20161205
Lead Channel Impedance Value: 417 Ohm
Lead Channel Impedance Value: 743 Ohm
Lead Channel Pacing Threshold Amplitude: 0.5 V
Lead Channel Pacing Threshold Amplitude: 0.75 V
Lead Channel Pacing Threshold Pulse Width: 0.4 ms
Lead Channel Pacing Threshold Pulse Width: 0.4 ms
Lead Channel Setting Pacing Amplitude: 2 V
Lead Channel Setting Pacing Amplitude: 2.5 V
Lead Channel Setting Pacing Pulse Width: 0.4 ms
Lead Channel Setting Sensing Sensitivity: 4 mV

## 2022-08-28 MED ORDER — FLUCONAZOLE 150 MG PO TABS: 150.0000 mg | ORAL_TABLET | Freq: Every day | ORAL | 0 refills | Status: DC

## 2022-08-28 MED ORDER — METAXALONE 400 MG PO TABS
400.0000 mg | ORAL_TABLET | Freq: Three times a day (TID) | ORAL | 1 refills | Status: DC
Start: 2022-08-28 — End: 2022-10-03

## 2022-08-28 MED ORDER — BUTALBITAL-APAP-CAFFEINE 50-325-40 MG PO TABS: ORAL_TABLET | ORAL | 0 refills | Status: DC

## 2022-08-28 NOTE — Patient Instructions (Addendum)
I have refilled the fioricet for prn use    I'm glad the fluconazole relieved your pain. I have refilled it.   I have reduced the skelaxin dose to 400 mg for the daytime dose

## 2022-08-28 NOTE — Assessment & Plan Note (Signed)
Secondary to occult pubic rami fractures found on pelvic CT in August . Pain has finally ben brought under control

## 2022-08-28 NOTE — Progress Notes (Signed)
Subjective:  Patient ID: [name] [name], female    DOB: 02/06/1941  Age: 81 y.o. MRN: [id number]  CC: The primary encounter diagnosis was Need for immunization against influenza. Diagnoses of Polypharmacy, Exposure to mold, Closed bilateral fracture of pubic rami, sequela, Right groin pain, and Candidiasis of anus were also pertinent to this visit.   HPI JALEI SHIBLEY presents for management of anxiety related to health  issues Chief Complaint  Patient presents with   Follow-up   1) had several days of BM's described as burning, irritating .   Vagina and clitoris became irritated last week.   Took 2 out of date diflucan (QD x 2 days)and symptoms resolved. Worried that the vaginal yeast infection is related to the mold exposure  2) Concerns about medication polypharmacy.  Sleep cycle has been disrupted; falling asleep during the day for up to 3 hours, the up till 4 am some nights. Does not want to stop gabapentin 200 mg tid bc if its anti seizure properties,  taking skelaxin tid has cut back to bid bc it makes her sleepy during the day   3) walking with a walker.  Feels that the use of a cane too early after rehab contributed to her pelvic fracture found in August by CT  done by [facility] ortho  4) had a significant reaction to mold recently discovered in her kitchen  on or around sept 16  due to a refrigerator leak at [facility] . Had numbness of leg,  and weak hand ,  her third exposure,  resulted in increased anxiety bc she could not leave the home for a few days .  She is concerned that the mold has not been properly treated;   she has considered litigation but has decided against it . Planning to get HEPA filters. .  Had some nocturnal wheezing prior to finding the mold.  Wheazing has resolved   5) h/o pelvic fracture ; using walker .    6) pacemaker check   Outpatient Medications Prior to Visit  Medication Sig Dispense Refill   apixaban (ELIQUIS) 5 MG TABS tablet TAKE ONE  TABLET TWICE DAILY 180 tablet 2   BRIVIACT 50 MG TABS Take 1 tablet by mouth 2 (two) times daily.     calcium carbonate (CALCIUM 600) 600 MG TABS tablet      Cholecalciferol (VITAMIN D) 2000 units CAPS Take 2,000 Units by mouth daily.     fluticasone (FLONASE) 50 MCG/ACT nasal spray USE 2 SPRAYS IN BOTH NOSTRILS DAILY 16 g 11   Folic Acid 0.8 MG CAPS Take 0.8 mg by mouth daily.     furosemide (LASIX) 40 MG tablet TAKE ONE (1) TABLET BY MOUTH TWICE PER WEEK; TAKE AN ADDITIONAL HALF (1/2) TO ONE (1) TABLET AS NEEDED FOR FLUID RETENTION 90 tablet 1   gabapentin (NEURONTIN) 100 MG capsule Take 200 mg by mouth 3 (three) times daily.     hydroquinone 4 % cream Apply topically in the morning and at bedtime.     lamoTRIgine (LAMICTAL) 150 MG tablet Take 200 mg by mouth 2 (two) times daily.     lamoTRIgine (LAMICTAL) 200 MG tablet Take 150 mg in the am & 200 mg in the pm.     losartan (COZAAR) 25 MG tablet TAKE ONE-HALF (1/2) TABLET BY MOUTH EVERY DAY 45 tablet 2   metaxalone (SKELAXIN) 800 MG tablet TAKE ONE TABLET BY MOUTH 3 TIMES DAILY 90 tablet 1   metoprolol succinate (  TOPROL-XL) 25 MG 24 hr tablet Take 1 tablet (25 mg total) by mouth daily. 90 tablet 0   montelukast (SINGULAIR) 10 MG tablet TAKE ONE TABLET BY MOUTH AT BEDTIME 90 tablet 1   polyethylene glycol powder (GLYCOLAX/MIRALAX) 17 GM/SCOOP powder Take 1 Dose by mouth daily.     rosuvastatin (CRESTOR) 40 MG tablet TAKE ONE TABLET EVERY DAY 90 tablet 1   spironolactone (ALDACTONE) 25 MG tablet TAKE 1/2 TABLET EVERY DAY (Patient taking differently: Take 25 mg by mouth every other day.) 45 tablet 2   VENTOLIN HFA 108 (90 Base) MCG/ACT inhaler INHALE 2 PUFFS INTO THE LUNGS EVERY 6 HOURS AS NEEDED FOR WHEEZING OR SHORTNESS OF BREATH 18 g 1   vitamin B-12 (CYANOCOBALAMIN) 1000 MCG tablet Take 1,000 mcg by mouth daily.     butalbital-acetaminophen-caffeine (FIORICET) 50-325-40 MG tablet TAKE 1 TABLET BY MOUTH ONCE DAILY AS NEEDED FOR BACK PAIN OR  MIGRAINE *MAX OF 3 TABS PER DAY* 90 tablet 0   gabapentin (NEURONTIN) 100 MG capsule      amoxicillin (AMOXIL) 500 MG capsule TAKE 4 CAPSULES ONE HOUR PRIOR TO DENTALPROCEDURE (Patient not taking: Reported on 08/28/2022) 30 capsule 0   No facility-administered medications prior to visit.    Review of Systems;  Patient denies headache, fevers, malaise, unintentional weight loss, skin rash, eye pain, sinus congestion and sinus pain, sore throat, dysphagia,  hemoptysis , cough, dyspnea, wheezing, chest pain, palpitations, orthopnea, edema, abdominal pain, nausea, melena, diarrhea, constipation, flank pain, dysuria, hematuria, urinary  Frequency, nocturia, numbness, tingling, seizures,  Focal weakness, Loss of consciousness,  Tremor, insomnia, depression, anxiety, and suicidal ideation.      Objective:  BP (!) 102/56 (BP Location: Left Arm, Patient Position: Sitting, Cuff Size: Normal)   Pulse (!) 54   Temp (!) 97.5 F (36.4 C) (Oral)   Ht '5\' 5"'$  (1.651 m)   Wt 146 lb 9.6 oz (66.5 kg)   SpO2 99%   BMI 24.40 kg/m   BP Readings from Last 3 Encounters:  08/28/22 (!) 102/56  06/26/22 102/60  06/22/22 133/89    Wt Readings from Last 3 Encounters:  08/28/22 146 lb 9.6 oz (66.5 kg)  06/26/22 145 lb 12.8 oz (66.1 kg)  06/22/22 140 lb (63.5 kg)    General appearance: alert, cooperative and appears stated age Ears: normal TM's and external ear canals both ears Throat: lips, mucosa, and tongue normal; teeth and gums normal Neck: no adenopathy, no carotid bruit, supple, symmetrical, trachea midline and thyroid not enlarged, symmetric, no tenderness/mass/nodules Back: symmetric, no curvature. ROM normal. No CVA tenderness. Lungs: clear to auscultation bilaterally Heart: regular rate and rhythm, S1, S2 normal, no murmur, click, rub or gallop Abdomen: soft, non-tender; bowel sounds normal; no masses,  no organomegaly Pulses: 2+ and symmetric Skin: Skin color, texture, turgor normal. No rashes  or lesions Lymph nodes: Cervical, supraclavicular, and axillary nodes normal. Neuro:  awake and interactive with normal mood and affect. Higher cortical functions are normal. Speech is clear without word-finding difficulty or dysarthria. Extraocular movements are intact. Visual fields of both eyes are grossly intact. Sensation to light touch is grossly intact bilaterally of upper and lower extremities. Motor examination shows 4+/5 symmetric hand grip and upper extremity and 5/5 lower extremity strength. There is no pronation or drift. Gait is non-ataxic   Lab Results  Component Value Date   HGBA1C 5.5 02/11/2020   HGBA1C 5.7 05/21/2019   HGBA1C 5.3 04/07/2018    Lab Results  Component Value  Date   CREATININE 1.06 06/26/2022   CREATININE 1.09 (H) 04/02/2022   CREATININE 1.00 10/03/2021    Lab Results  Component Value Date   WBC 5.6 06/26/2022   HGB 10.1 (L) 06/26/2022   HCT 29.5 (L) 06/26/2022   PLT 128.0 (L) 06/26/2022   GLUCOSE 86 06/26/2022   CHOL 143 05/21/2019   TRIG 60.0 05/21/2019   HDL 59.50 05/21/2019   LDLDIRECT 113.0 02/15/2016   LDLCALC 71 05/21/2019   ALT 13 04/02/2022   AST 21 04/02/2022   NA 135 06/26/2022   K 4.0 06/26/2022   CL 101 06/26/2022   CREATININE 1.06 06/26/2022   BUN 22 06/26/2022   CO2 27 06/26/2022   TSH 2.991 07/03/2021   INR 1.2 06/18/2021   HGBA1C 5.5 02/11/2020   MICROALBUR 2.8 (H) 11/13/2021    CT HEAD WO CONTRAST (5MM)  Result Date: 07/24/2022 CLINICAL DATA:  Head injury EXAM: CT HEAD WITHOUT CONTRAST TECHNIQUE: Contiguous axial images were obtained from the base of the skull through the vertex without intravenous contrast. RADIATION DOSE REDUCTION: This exam was performed according to the departmental dose-optimization program which includes automated exposure control, adjustment of the mA and/or kV according to patient size and/or use of iterative reconstruction technique. COMPARISON:  June 22, 2022 FINDINGS: Brain: No evidence of  acute infarction, hemorrhage, hydrocephalus, extra-axial collection or mass lesion/mass effect. Mild cerebral volume loss, age-appropriate. Vascular: No hyperdense vessel or unexpected calcification. Skull: Normal. Negative for fracture or focal lesion. Sinuses/Orbits: No acute finding. Other: None. IMPRESSION: No acute intracranial abnormality. Electronically Signed   By: Beryle Flock M.D.   On: 07/24/2022 14:50    Assessment & Plan:   Problem List Items Addressed This Visit     Right groin pain    Secondary to occult pubic rami fractures found on pelvic CT in August . Pain has finally ben brought under control       Polypharmacy    Medications reviewed for cause of daytime sedation leading to prolonged naps and nocturnal sleeplessness.  Skelaxin dose reduced to 400 mg.        Exposure to mold    Lung exam is normal today.  Reassurance provided that vaginal/rectal candidiasis is not caused by household mold exposure.  Agree with use of HEPA filters to reduce spore       Closed bilateral fracture of pubic rami (HCC)   Candidiasis of anus    Presumed, based on history of rectal and vaginal irritation resolved with 2 days of fluconazole.       Relevant Medications   fluconazole (DIFLUCAN) 150 MG tablet   Other Visit Diagnoses     Need for immunization against influenza    -  Primary   Relevant Orders   Flu Vaccine QUAD High Dose(Fluad) (Completed)       I spent a total of  40 minutes with this patient in a face to face visit on the date of this encounter reviewing the last office visit with me in  July, her  most recent visit with cardiology, neurology, and Orthopedics, including labs and CT scans ,   and post visit ordering of testing and therapeutics.    Follow-up: No follow-ups on file.   [name], MD

## 2022-08-28 NOTE — Assessment & Plan Note (Signed)
Presumed, based on history of rectal and vaginal irritation resolved with 2 days of fluconazole.

## 2022-08-28 NOTE — Assessment & Plan Note (Signed)
Medications reviewed for cause of daytime sedation leading to prolonged naps and nocturnal sleeplessness.  Skelaxin dose reduced to 400 mg.

## 2022-08-28 NOTE — Assessment & Plan Note (Signed)
Lung exam is normal today.  Reassurance provided that vaginal/rectal candidiasis is not caused by household mold exposure.  Agree with use of HEPA filters to reduce spore

## 2022-09-06 NOTE — Progress Notes (Signed)
Remote pacemaker transmission.   

## 2022-09-10 ENCOUNTER — Telehealth: Payer: Self-pay | Admitting: *Deleted

## 2022-09-10 NOTE — Telephone Encounter (Signed)
Appt scheduled for 10/16/22 for Prolia injection after being off of it for one year due to a medication.  $0 due, no PA required

## 2022-09-20 ENCOUNTER — Other Ambulatory Visit: Payer: Self-pay | Admitting: Orthopedic Surgery

## 2022-09-20 DIAGNOSIS — S32511D Fracture of superior rim of right pubis, subsequent encounter for fracture with routine healing: Secondary | ICD-10-CM

## 2022-09-20 DIAGNOSIS — S32591D Other specified fracture of right pubis, subsequent encounter for fracture with routine healing: Secondary | ICD-10-CM

## 2022-09-24 ENCOUNTER — Other Ambulatory Visit: Payer: Self-pay | Admitting: Orthopedic Surgery

## 2022-09-24 DIAGNOSIS — M818 Other osteoporosis without current pathological fracture: Secondary | ICD-10-CM

## 2022-09-26 ENCOUNTER — Ambulatory Visit: Payer: Medicare Other | Admitting: Internal Medicine

## 2022-09-27 ENCOUNTER — Telehealth: Payer: Self-pay | Admitting: *Deleted

## 2022-09-27 NOTE — Patient Outreach (Signed)
  Care Coordination   Initial Visit Note   09/27/2022 Name: [name] [name] MRN: [id number] DOB: 16-May-1941  [name] [name] is a 81 y.o. year old female who sees Derrel Nip, Aris Everts, MD for primary care. I spoke with  [name] by phone today.  What matters to the patients health and wellness today?  No health concerns voiced. RN discussed [place], RN, SW, and Pharmacist. Patient declined services.     Goals Addressed             This Visit's Progress    Patient advised to follow up with AWV and Vaccines            SDOH assessments and interventions completed:  Yes     Care Coordination Interventions Activated:  Yes  Care Coordination Interventions:  Yes, provided Care coordination program/ services discussed  Social determinants of health survey completed Annual wellness visit discussed and patient advised to contact provider office to schedule Patient advised to contact primary care provider office  if care coordination services needed in the future  Follow up plan: No further intervention required.   Encounter Outcome:  Pt. Visit Completed

## 2022-09-30 ENCOUNTER — Encounter (INDEPENDENT_AMBULATORY_CARE_PROVIDER_SITE_OTHER): Payer: Self-pay

## 2022-10-02 ENCOUNTER — Other Ambulatory Visit: Payer: Self-pay

## 2022-10-02 DIAGNOSIS — D72820 Lymphocytosis (symptomatic): Secondary | ICD-10-CM

## 2022-10-03 ENCOUNTER — Inpatient Hospital Stay: Payer: Medicare Other | Attending: Oncology

## 2022-10-03 ENCOUNTER — Inpatient Hospital Stay (HOSPITAL_BASED_OUTPATIENT_CLINIC_OR_DEPARTMENT_OTHER): Payer: Medicare Other | Admitting: Oncology

## 2022-10-03 ENCOUNTER — Encounter: Payer: Self-pay | Admitting: Oncology

## 2022-10-03 VITALS — BP 110/2 | HR 75 | Temp 97.3°F | Resp 18 | Wt 148.0 lb

## 2022-10-03 DIAGNOSIS — D649 Anemia, unspecified: Secondary | ICD-10-CM | POA: Diagnosis not present

## 2022-10-03 DIAGNOSIS — N1831 Chronic kidney disease, stage 3a: Secondary | ICD-10-CM | POA: Diagnosis not present

## 2022-10-03 DIAGNOSIS — D72820 Lymphocytosis (symptomatic): Secondary | ICD-10-CM

## 2022-10-03 DIAGNOSIS — R7402 Elevation of levels of lactic acid dehydrogenase (LDH): Secondary | ICD-10-CM

## 2022-10-03 DIAGNOSIS — D631 Anemia in chronic kidney disease: Secondary | ICD-10-CM

## 2022-10-03 DIAGNOSIS — D638 Anemia in other chronic diseases classified elsewhere: Secondary | ICD-10-CM

## 2022-10-03 DIAGNOSIS — D696 Thrombocytopenia, unspecified: Secondary | ICD-10-CM

## 2022-10-03 LAB — COMPREHENSIVE METABOLIC PANEL
ALT: 17 U/L (ref 0–44)
AST: 25 U/L (ref 15–41)
Albumin: 4.2 g/dL (ref 3.5–5.0)
Alkaline Phosphatase: 102 U/L (ref 38–126)
Anion gap: 8 (ref 5–15)
BUN: 31 mg/dL — ABNORMAL HIGH (ref 8–23)
CO2: 29 mmol/L (ref 22–32)
Calcium: 9.5 mg/dL (ref 8.9–10.3)
Chloride: 101 mmol/L (ref 98–111)
Creatinine, Ser: 1.16 mg/dL — ABNORMAL HIGH (ref 0.44–1.00)
GFR, Estimated: 47 mL/min — ABNORMAL LOW (ref >60)
Glucose, Bld: 97 mg/dL (ref 70–99)
Potassium: 3.7 mmol/L (ref 3.5–5.1)
Sodium: 138 mmol/L (ref 135–145)
Total Bilirubin: 0.5 mg/dL (ref 0.3–1.2)
Total Protein: 7.1 g/dL (ref 6.5–8.1)

## 2022-10-03 LAB — CBC WITH DIFFERENTIAL/PLATELET
Abs Immature Granulocytes: 0.01 10*3/uL (ref 0.00–0.07)
Basophils Absolute: 0 10*3/uL (ref 0.0–0.1)
Basophils Relative: 1 %
Eosinophils Absolute: 0.1 10*3/uL (ref 0.0–0.5)
Eosinophils Relative: 3 %
HCT: 32.5 % — ABNORMAL LOW (ref 36.0–46.0)
Hemoglobin: 10.2 g/dL — ABNORMAL LOW (ref 12.0–15.0)
Immature Granulocytes: 0 %
Lymphocytes Relative: 30 %
Lymphs Abs: 1.4 10*3/uL (ref 0.7–4.0)
MCH: 30.7 pg (ref 26.0–34.0)
MCHC: 31.4 g/dL (ref 30.0–36.0)
MCV: 97.9 fL (ref 80.0–100.0)
Monocytes Absolute: 0.4 10*3/uL (ref 0.1–1.0)
Monocytes Relative: 8 %
Neutro Abs: 2.8 10*3/uL (ref 1.7–7.7)
Neutrophils Relative %: 58 %
Platelets: 138 10*3/uL — ABNORMAL LOW (ref 150–400)
RBC: 3.32 MIL/uL — ABNORMAL LOW (ref 3.87–5.11)
RDW: 13.2 % (ref 11.5–15.5)
WBC: 4.7 10*3/uL (ref 4.0–10.5)
nRBC: 0 % (ref 0.0–0.2)

## 2022-10-03 LAB — LACTATE DEHYDROGENASE: LDH: 240 U/L — ABNORMAL HIGH (ref 98–192)

## 2022-10-03 NOTE — Progress Notes (Signed)
Pt here for follow up. Pt reports she had a fall in July and had a pelvic fracture.

## 2022-10-03 NOTE — Assessment & Plan Note (Signed)
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  LDH is chronically elevated.

## 2022-10-03 NOTE — Progress Notes (Signed)
Hematology/Oncology Progress Note [facility] [phone] [phone]   ASSESSMENT & PLAN:   Monoclonal B-cell lymphocytosis of undetermined significance No constitution symptoms.  Recommend observation.   Elevated serum lactate dehydrogenase (LDH) Likely due to hemolysis secondary to prosthetics mitral valve.  LDH is chronically elevated.    Anemia in chronic kidney disease previous workup included normal iron panel, b12, and folate. No M protein on SPEP.  Hemoglobin is stable. Continue observation.    Orders Placed This Encounter  Procedures   CBC with Differential/Platelet    Standing Status:   Future    Standing Expiration Date:   10/04/2023   Comprehensive metabolic panel    Standing Status:   Future    Standing Expiration Date:   10/03/2023   Lactate dehydrogenase    Standing Status:   Future    Standing Expiration Date:   10/04/2023   Flow cytometry panel-leukemia/lymphoma work-up    Standing Status:   Future    Standing Expiration Date:   10/04/2023   Follow up in 6 months.  All questions were answered. The patient knows to call the clinic with any problems, questions or concerns.  Earlie Server, MD, PhD [facility] Hematology Oncology 10/03/2022    CHIEF COMPLAINTS/REASON FOR VISIT:  Follow up for anemia and thrombocytopenia.   HISTORY OF PRESENTING ILLNESS:  [name] [name] is a  81 y.o.  female presents for follow up of thrombocytopenia, anemia, monoclonal lymphocytosis. . .  06/17/2021- 06/23/2021 patient was hospitalized due to left displaced femoral fracture and dislocation of at the fifth PIP. She was evaluated by orthopedic surgery and underwent ORIF on 06/18/2021.  Finger dislocation was also reduced in the OR. Patient has chronic anemia with hemoglobin around 11 before her admission.  Hemoglobin dropped to 7.4 dueing her hospitalization s/p PRBC transfusion. Hemoglobin improved 8 on 06/22/2021. At discharge, 06/23/2021  cbc showed hemoglobin 7.4, platelet count 110000  Patient reports feeling fatigue.   INTERVAL HISTORY [name] [name] is a 81 y.o. female presents for follow up of thrombocytopenia, anemia, monoclonal lymphocytosis. .  She reports ongoing poor sleep and neuropathy. Chronic back pain.  + frequent falls, recent nondisplaced fractures involving the right superior and inferior pubic rami. Appetite is good. Weight is stable.   Review of Systems  Constitutional:  Positive for fatigue. Negative for appetite change, chills and fever.  HENT:   Negative for hearing loss and voice change.   Eyes:  Negative for eye problems.  Respiratory:  Negative for chest tightness, cough and shortness of breath.   Cardiovascular:  Negative for chest pain.  Gastrointestinal:  Negative for abdominal distention, abdominal pain and blood in stool.  Endocrine: Negative for hot flashes.  Genitourinary:  Negative for difficulty urinating and frequency.   Musculoskeletal:  Negative for arthralgias.       Recent nondisplaced fractures involving the right superior and inferior pubic rami.  Skin:  Negative for itching and rash.  Neurological:  Negative for extremity weakness.  Hematological:  Negative for adenopathy.  Psychiatric/Behavioral:  Negative for confusion.     MEDICAL HISTORY:  Past Medical History:  Diagnosis Date   Acute on chronic diastolic (congestive) heart failure ([place])    Allergy    See list   Anemia 2018   Anxiety    Occasionally take Xanax for sleep   Anxiety associated with depression    Prn alprazolam    Anxiety associated with depression    Arthritis    Hands, Back  Atrial fibrillation, persistent ([place])    DCCV 08/22/2015   Bradycardia post-op bradycardia, pacer dependent   MDT PPM 11/06/15, Dr. Lovena Le   Cataract    Left eye   Chronic kidney disease (CKD) stage G3a/A2, moderately decreased glomerular filtration rate (GFR) between 45-59 mL/min/1.73 square meter and albuminuria  creatinine ratio between 30-299 mg/g ([place]) 11/13/2021   Diverticulosis    Focal seizures ([place])    Heart murmur    Hematuria, gross 04/09/2018   Hemorrhoid    Hepatic cyst    innumerable   History of asbestos exposure    Hyperlipidemia    IBS (irritable bowel syndrome)    Idiopathic thrombocytopenic purpura (ITP) (HCC)    Migraine    MVP (mitral valve prolapse)    Nodule of right lung    Osteoporosis of forearm    RBBB    Restrictive lung disease    Mild on PFT & likely cardiac in etiology    Right sided sciatica 04/10/2022   S/P Minimally invasive maze operation for atrial fibrillation 10/31/2015   Complete bilateral atrial lesion set using cryothermy and bipolar radiofrequency ablation with clipping of LA appendage via right mini thoracotomy approach   S/P minimally invasive mitral valve replacement with bioprosthetic valve 10/31/2015   33 mm Edwards Magna Mitral bovine bioprosthetic tissue valve placed via right mini thoracotomy approach   Seizures ([place])    left foot paralysis and left hand paralysis Dr. 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   SVT (supraventricular tachycardia)    Thoracic aorta atherosclerosis (HCC)    TIA (transient ischemic attack)     SURGICAL HISTORY: Past Surgical History:  Procedure Laterality Date   BREAST EXCISIONAL BIOPSY Right Late 80s   Negative X2   CARDIAC CATHETERIZATION N/A 10/18/2015   Procedure: Right/Left Heart Cath and Coronary Angiography;  Surgeon: Sherren Mocha, MD;  Location: [place] [facility];  Service: Cardiovascular;  Laterality: N/A;   CARDIOVERSION N/A 08/22/2015   Procedure: CARDIOVERSION;  Surgeon: Thayer Headings, MD;  Location[facility] ENDOSCOPY;  Service: Cardiovascular;  Laterality: N/A;   COLONOSCOPY  2003   EP IMPLANTABLE DEVICE N/A 11/06/2015   Procedure: Pacemaker Implant;  Surgeon: Evans Lance, MD;  Location: [place] [facility];  Service: Cardiovascular;  Laterality:  N/A;   EP IMPLANTABLE DEVICE N/A 02/20/2016   Procedure: Lead Extraction;  Surgeon: Evans Lance, MD;  Location: [place] [facility];  Service: Cardiovascular;  Laterality: N/A;   EYE SURGERY Right 2013   FEMUR IM NAIL Left 06/18/2021   Procedure: INTRAMEDULLARY (IM) NAIL FEMORAL, OPEN REDUCTION INTERNAL FIXATION FEMUR RIGHT LITTLE FINGER PIP CLOSED REDUCTION;  Surgeon: Altamese Rogers, MD;  Location: [place];  Service: Orthopedics;  Laterality: Left;   FOOT SURGERY     ~2007 right foot bunion   MANDIBLE FRACTURE SURGERY  03/26/2013   MINIMALLY INVASIVE MAZE PROCEDURE N/A 10/31/2015   Procedure: MINIMALLY INVASIVE MAZE PROCEDURE;  Surgeon: Rexene Alberts, MD;  Location: [place];  Service: Open Heart Surgery;  Laterality: N/A;   MITRAL VALVE REPLACEMENT Right 10/31/2015   Procedure: MINIMALLY INVASIVE MITRAL VALVE (MV) REPLACEMENT;  Surgeon: Rexene Alberts, MD;  Location: [place];  Service: Open Heart Surgery;  Laterality: Right;   PACEMAKER LEAD REMOVAL  02/20/2016   TEE WITH CARDIOVERSION     TEE WITHOUT CARDIOVERSION N/A 08/22/2015   Procedure: TRANSESOPHAGEAL ECHOCARDIOGRAM (TEE);  Surgeon: Thayer Headings, MD;  Location:  [facility] ENDOSCOPY;  Service: Cardiovascular;  Laterality: N/A;   TEE WITHOUT CARDIOVERSION N/A 10/31/2015   Procedure: TRANSESOPHAGEAL ECHOCARDIOGRAM (TEE);  Surgeon: Rexene Alberts, MD;  Location: [place];  Service: Open Heart Surgery;  Laterality: N/A;   TUBAL LIGATION     VARICOSE VEIN SURGERY Right     SOCIAL HISTORY: Social History   Socioeconomic History   Marital status: Widowed    Spouse name: Deceased   Number of children: 2   Years of education: 16   Highest education level: Not on file  Occupational History   Occupation: Retired  Tobacco Use   Smoking status: Never   Smokeless tobacco: Never  Vaping Use   Vaping Use: Never used  Substance and Sexual Activity   Alcohol use: No    Alcohol/week: 3.0 standard drinks of alcohol    Types: 3 Standard drinks or  equivalent per week   Drug use: No   Sexual activity: Not Currently  Other Topics Concern   Not on file  Social History Narrative   ** Merged History Encounter **       She is a widow.  Husband died from metastatic renal cell cancer. Originally from [place]. Previously lived in [place] from 1975-[place]. Moved to [place] in 2007. No mold exposure recently but did have it through a prior work exposure in 1993. Has a masters in    public health. No bird exposure.  [facility] (09/29/17): She has moved into a retirement community since last appointment. She reports she had testing at her new residence that was positive for mold. It has since been treated.    Social Determinants of Health   Financial Resource Strain: Low Risk  (03/12/2018)   Overall Financial Resource Strain (CARDIA)    Difficulty of Paying Living Expenses: Not hard at all  Food Insecurity: No Food Insecurity (03/12/2018)   Hunger Vital Sign    Worried About Running Out of Food in the Last Year: Never true    Ran Out of Food in the Last Year: Never true  Transportation Needs: No Transportation Needs (03/12/2018)   PRAPARE - Hydrologist (Medical): No    Lack of Transportation (Non-Medical): No  Physical Activity: Not on file  Stress: Not on file  Social Connections: Not on file  Intimate Partner Violence: Not on file    FAMILY HISTORY: Family History  Problem Relation Age of Onset   Hypertension Mother    Arrhythmia Mother    Heart failure Mother    Arrhythmia Brother    Stroke Brother 23       cerebral hemorrhage, nonsmoker, no HTN   Prostate cancer Brother    Stroke Father        from an aneurysm   Stroke Maternal Aunt 83       cerebral hemorrhage   Liver cancer Maternal Grandmother    Atrial fibrillation Son    Celiac disease Son    Heart attack Neg Hx    Breast cancer Neg Hx     ALLERGIES:  is allergic to acetaminophen-codeine, codeine, epinephrine, hydrocodone,  hydromorphone, molds & smuts, oxycodone, meloxicam, codeine, dextromethorphan, diphen [diphenhydramine hcl], diphenhydramine, diphenhydramine hcl, diphenylpyraline, doxycycline, doxycycline, hydrocodone, hydromorphone, meloxicam, mobic [meloxicam], nsaids, nsaids, oxycodone, propofol, and propofol.  MEDICATIONS:  Current Outpatient Medications  Medication Sig Dispense Refill   amoxicillin (AMOXIL) 500 MG capsule TAKE 4 CAPSULES ONE HOUR PRIOR TO DENTALPROCEDURE 30 capsule 0   apixaban (ELIQUIS) 5 MG TABS tablet TAKE  ONE TABLET TWICE DAILY 180 tablet 2   BRIVIACT 50 MG TABS Take 1 tablet by mouth 2 (two) times daily.     butalbital-acetaminophen-caffeine (FIORICET) 50-325-40 MG tablet TAKE 1 TABLET BY MOUTH ONCE DAILY AS NEEDED FOR BACK PAIN OR MIGRAINE MAX OF 3 TABS PER DAY 90 tablet 0   calcium carbonate (CALCIUM 600) 600 MG TABS tablet      Cholecalciferol (VITAMIN D) 2000 units CAPS Take 2,000 Units by mouth daily.     fluticasone (FLONASE) 50 MCG/ACT nasal spray USE 2 SPRAYS IN BOTH NOSTRILS DAILY 16 g 11   folic acid (FOLVITE) 1 MG tablet Take 1 mg by mouth daily.     furosemide (LASIX) 40 MG tablet TAKE ONE (1) TABLET BY MOUTH TWICE PER WEEK; TAKE AN ADDITIONAL HALF (1/2) TO ONE (1) TABLET AS NEEDED FOR FLUID RETENTION 90 tablet 1   gabapentin (NEURONTIN) 100 MG capsule Take 200 mg by mouth 3 (three) times daily.     lamoTRIgine (LAMICTAL) 150 MG tablet Take 200 mg by mouth 2 (two) times daily.     lamoTRIgine (LAMICTAL) 200 MG tablet Take 150 mg in the am & 200 mg in the pm.     losartan (COZAAR) 25 MG tablet TAKE ONE-HALF (1/2) TABLET BY MOUTH EVERY DAY 45 tablet 2   metaxalone (SKELAXIN) 800 MG tablet TAKE ONE TABLET BY MOUTH 3 TIMES DAILY 90 tablet 1   metoprolol succinate (TOPROL-XL) 25 MG 24 hr tablet Take 1 tablet (25 mg total) by mouth daily. 90 tablet 0   montelukast (SINGULAIR) 10 MG tablet TAKE ONE TABLET BY MOUTH AT BEDTIME 90 tablet 1   polyethylene glycol powder  (GLYCOLAX/MIRALAX) 17 GM/SCOOP powder Take 1 Dose by mouth daily.     rosuvastatin (CRESTOR) 40 MG tablet TAKE ONE TABLET EVERY DAY 90 tablet 1   spironolactone (ALDACTONE) 25 MG tablet TAKE 1/2 TABLET EVERY DAY (Patient taking differently: Take 25 mg by mouth every other day.) 45 tablet 2   VENTOLIN HFA 108 (90 Base) MCG/ACT inhaler INHALE 2 PUFFS INTO THE LUNGS EVERY 6 HOURS AS NEEDED FOR WHEEZING OR SHORTNESS OF BREATH 18 g 1   vitamin B-12 (CYANOCOBALAMIN) 1000 MCG tablet Take 1,000 mcg by mouth daily.     No current facility-administered medications for this visit.     PHYSICAL EXAMINATION: ECOG PERFORMANCE STATUS: 1 - Symptomatic but completely ambulatory Vitals:   10/03/22 1357  BP: (!) 110/2  Pulse: 75  Resp: 18  Temp: (!) 97.3 F (36.3 C)   Filed Weights   10/03/22 1357  Weight: 148 lb (67.1 kg)    Physical Exam Constitutional:      General: She is not in acute distress.    Appearance: She is well-developed. She is not ill-appearing.     Comments: Frail appearing, she walks with a walker  HENT:     Head: Atraumatic.     Mouth/Throat:     Pharynx: No oropharyngeal exudate.  Eyes:     General: No scleral icterus. Cardiovascular:     Rate and Rhythm: Normal rate and regular rhythm.  Pulmonary:     Effort: Pulmonary effort is normal.     Breath sounds: Normal breath sounds.  Abdominal:     General: There is no distension.     Palpations: Abdomen is soft.     Tenderness: There is no abdominal tenderness.  Musculoskeletal:        General: No tenderness or deformity. Normal range of motion.  Cervical back: Normal range of motion and neck supple.  Skin:    General: Skin is warm and dry.  Neurological:     Mental Status: She is alert and oriented to person, place, and time. Mental status is at baseline.  Psychiatric:        Mood and Affect: Mood normal.     LABORATORY DATA:  I have reviewed the data as listed    Latest Ref Rng & Units 10/03/2022    1:10  PM 06/26/2022   12:09 PM 04/02/2022   10:22 AM  CBC  WBC 4.0 - 10.5 K/uL 4.7  5.6  4.9   Hemoglobin 12.0 - 15.0 g/dL 10.2  10.1  11.0   Hematocrit 36.0 - 46.0 % 32.5  29.5  32.9   Platelets 150 - 400 K/uL 138  128.0  124       Latest Ref Rng & Units 10/03/2022    1:10 PM 06/26/2022   12:09 PM 04/02/2022   10:22 AM  CMP  Glucose 70 - 99 mg/dL 97  86  93   BUN 8 - 23 mg/dL 31  22  33   Creatinine 0.44 - 1.00 mg/dL 1.16  1.06  1.09   Sodium 135 - 145 mmol/L 138  135  134   Potassium 3.5 - 5.1 mmol/L 3.7  4.0  3.7   Chloride 98 - 111 mmol/L 101  101  97   CO2 22 - 32 mmol/L '29  27  26   '$ Calcium 8.9 - 10.3 mg/dL 9.5  8.9  9.3   Total Protein 6.5 - 8.1 g/dL 7.1   6.4   Total Bilirubin 0.3 - 1.2 mg/dL 0.5   0.7   Alkaline Phos 38 - 126 U/L 102   118   AST 15 - 41 U/L 25   21   ALT 0 - 44 U/L 17   13    Lab Results  Component Value Date   IRON 63 08/23/2022   TIBC 348.6 08/23/2022   FERRITIN 37.8 08/23/2022    RADIOGRAPHIC STUDIES: I have personally reviewed the radiological images as listed and agreed with the findings in the report. No results found.

## 2022-10-03 NOTE — Assessment & Plan Note (Addendum)
previous workup included normal iron panel, b12, and folate. No M protein on SPEP.  Hemoglobin is stable. Continue observation.

## 2022-10-03 NOTE — Assessment & Plan Note (Signed)
Platelet count is stable. Observation.

## 2022-10-03 NOTE — Assessment & Plan Note (Signed)
No constitution symptoms.  Recommend observation.

## 2022-10-07 ENCOUNTER — Other Ambulatory Visit: Payer: Self-pay | Admitting: Internal Medicine

## 2022-10-07 ENCOUNTER — Other Ambulatory Visit: Payer: Self-pay | Admitting: Family Medicine

## 2022-10-16 ENCOUNTER — Ambulatory Visit (INDEPENDENT_AMBULATORY_CARE_PROVIDER_SITE_OTHER): Payer: Medicare Other

## 2022-10-16 DIAGNOSIS — M8000XS Age-related osteoporosis with current pathological fracture, unspecified site, sequela: Secondary | ICD-10-CM

## 2022-10-16 MED ORDER — DENOSUMAB 60 MG/ML ~~LOC~~ SOSY
60.0000 mg | PREFILLED_SYRINGE | Freq: Once | SUBCUTANEOUS | Status: AC
  Administered 2022-10-16: 60 mg via SUBCUTANEOUS

## 2022-10-16 NOTE — Progress Notes (Signed)
[name] [name] presents today for injection per MD orders. Prolia  administered SQ in right Upper Arm. Administration without incident. Patient tolerated well.  Tequila Rottmann,cma

## 2022-10-18 ENCOUNTER — Other Ambulatory Visit: Payer: Self-pay | Admitting: Internal Medicine

## 2022-10-18 DIAGNOSIS — Z1231 Encounter for screening mammogram for malignant neoplasm of breast: Secondary | ICD-10-CM

## 2022-10-21 ENCOUNTER — Encounter: Payer: Self-pay | Admitting: Cardiovascular Disease

## 2022-10-23 ENCOUNTER — Other Ambulatory Visit: Payer: Self-pay

## 2022-10-23 DIAGNOSIS — I5022 Chronic systolic (congestive) heart failure: Secondary | ICD-10-CM

## 2022-10-23 NOTE — Progress Notes (Signed)
See MyChart message from patient and recommendation from Dr. Rockey Situ.

## 2022-11-11 ENCOUNTER — Other Ambulatory Visit: Payer: Self-pay | Admitting: Cardiovascular Disease

## 2022-11-11 DIAGNOSIS — I4819 Other persistent atrial fibrillation: Secondary | ICD-10-CM

## 2022-11-11 DIAGNOSIS — I5022 Chronic systolic (congestive) heart failure: Secondary | ICD-10-CM

## 2022-11-11 NOTE — Progress Notes (Addendum)
Cardiology Office Note Date:  11/13/2022  Patient ID:  [name] [name], Bound 15-Nov-1941, MRN [id number] PCP:  [name], MD  Cardiologist:  Dr. Rockey Situ Electrophysiologist: Dr. Lovena Le    Chief Complaint: annual visit  History of Present Illness: KORTLYN KOLTZ is a 81 y.o. female with history of VHF (s/p MVR/bioprosthetic w/MAZE 2017), symptomatic bradycardia/CHB w/PPM, HTN, SVT (chart notes report ablated, unable to find details), seizure d/o, NICM, chronic CHF (combined diastolic/systolic), CKD (IIIa), ITP, RBBB, TIA  She saw Dr. Lovena Le 11/08/21, recent LLE fracture, though doing pretty well, device functioning normally, and maintaining SR.  No changes were made.  She saw Dr. Rockey Situ 12/05/21, had been started on tx for her osteoporosis, no cardiac complaints, no changes were made.  TODAY She is emotional today, upset about some family issues/interactions. Reminisces about a time in her life that she had better control of her life. She feels "flustered" this AM, woke late, rushing. She has not slept well or at all since a particularly upsetting discussion with her son a couple days ago She comments about poor life decisions that "plague her" to this day. She looks forward to a fancy dinner party planned for tonight Would love to be able to see counselor to help with her anxiety.  She denies CP, palpitations. She has noted for 3 mo or so RLE swelling She has had some falls and hip/femur fractures ADDEND: The most recent fall initially not felt to have cause any fractures, though with f/u imaging  per the patient 07/10/22 showed TWO FRACTURES ON RIGHT PUBIS RAMI  Is finally getting around better initially with her walker, bearing weight on that leg. She reports that she was with her grand daughter (who has some disabilities/delays) who accidentally knocked her over.   but has had pain fil hip/pelvis for weeks.  She did take an extra lasix dose for a couple days and wore a  support sticking did improve, but when she stopped, it got swollen again. She mentions that during a surgery she was told that they cut or removed her greater saphenous vein from that leg.  Device information MDT dual chamber PPM implanted 11/06/2015 Developed exit block on A lead > A lead extracted with new lead implant 02/20/2016   Past Medical History:  Diagnosis Date   Acute on chronic diastolic (congestive) heart failure ([place])    Allergy    See list   Anemia 2018   Anxiety    Occasionally take Xanax for sleep   Anxiety associated with depression    Prn alprazolam    Anxiety associated with depression    Arthritis    Hands, Back   Atrial fibrillation, persistent ([place])    DCCV 08/22/2015   Bradycardia post-op bradycardia, pacer dependent   MDT PPM 11/06/15, Dr. Lovena Le   Cataract    Left eye   Chronic kidney disease (CKD) stage G3a/A2, moderately decreased glomerular filtration rate (GFR) between 45-59 mL/min/1.73 square meter and albuminuria creatinine ratio between 30-299 mg/g ([place]) 11/13/2021   Diverticulosis    Focal seizures ([place])    Heart murmur    Hematuria, gross 04/09/2018   Hemorrhoid    Hepatic cyst    innumerable   History of asbestos exposure    Hyperlipidemia    IBS (irritable bowel syndrome)    Idiopathic thrombocytopenic purpura (ITP) (H[facility])    Migraine    MVP (mitral valve prolapse)    Nodule of right lung    Osteoporosis of  forearm    RBBB    Restrictive lung disease    Mild on PFT & likely cardiac in etiology    Right sided sciatica 04/10/2022   S/P Minimally invasive maze operation for atrial fibrillation 10/31/2015   Complete bilateral atrial lesion set using cryothermy and bipolar radiofrequency ablation with clipping of LA appendage via right mini thoracotomy approach   S/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   SVT (supraventricular tachycardia)    Thoracic aorta atherosclerosis ([facility])    TIA (transient ischemic attack)     Past Surgical History:  Procedure Laterality Date   BREAST EXCISIONAL BIOPSY Right Late 80s   Negative X2   CARDIAC CATHETERIZATION N/A 10/18/2015   Procedure: Right/Left Heart Cath and Coronary Angiography;  Surgeon: Sherren Mocha, MD;  Location: [place] [facility];  Service: Cardiovascular;  Laterality: N/A;   CARDIOVERSION N/A 08/22/2015   Procedure: CARDIOVERSION;  Surgeon: Thayer Headings, MD;  Location: [facility] ENDOSCOPY;  Service: Cardiovascular;  Laterality: N/A;   COLONOSCOPY  2003   EP IMPLANTABLE DEVICE N/A 11/06/2015   Procedure: Pacemaker Implant;  Surgeon: Evans Lance, MD;  Location: [place] [facility];  Service: Cardiovascular;  Laterality: N/A;   EP IMPLANTABLE DEVICE N/A 02/20/2016   Procedure: Lead Extraction;  Surgeon: Evans Lance, MD;  Location: [place] [facility];  Service: Cardiovascular;  Laterality: N/A;   EYE SURGERY Right 2013   FEMUR IM NAIL Left 06/18/2021   Procedure: INTRAMEDULLARY (IM) NAIL FEMORAL, OPEN REDUCTION INTERNAL FIXATION FEMUR RIGHT LITTLE FINGER PIP CLOSED REDUCTION;  Surgeon: Altamese Weldon, MD;  Location: [place];  Service: Orthopedics;  Laterality: Left;   FOOT SURGERY     ~2007 right foot bunion   MANDIBLE FRACTURE SURGERY  03/26/2013   MINIMALLY INVASIVE MAZE PROCEDURE N/A 10/31/2015   Procedure: MINIMALLY INVASIVE MAZE PROCEDURE;  Surgeon: Rexene Alberts, MD;  Location: [place];  Service: Open Heart Surgery;  Laterality: N/A;   MITRAL VALVE REPLACEMENT Right 10/31/2015   Procedure: MINIMALLY INVASIVE MITRAL VALVE (MV) REPLACEMENT;  Surgeon: Rexene Alberts, MD;  Location: [place];  Service: Open Heart Surgery;  Laterality: Right;   PACEMAKER LEAD REMOVAL   02/20/2016   TEE WITH CARDIOVERSION     TEE WITHOUT CARDIOVERSION N/A 08/22/2015   Procedure: TRANSESOPHAGEAL ECHOCARDIOGRAM (TEE);  Surgeon: Thayer Headings, MD;  Location: [place];  Service: Cardiovascular;  Laterality: N/A;   TEE WITHOUT CARDIOVERSION N/A 10/31/2015   Procedure: TRANSESOPHAGEAL ECHOCARDIOGRAM (TEE);  Surgeon: Rexene Alberts, MD;  Location: [place];  Service: Open Heart Surgery;  Laterality: N/A;   TUBAL LIGATION     VARICOSE VEIN SURGERY Right     Current Outpatient Medications  Medication Sig Dispense Refill   amoxicillin (AMOXIL) 500 MG capsule TAKE 4 CAPSULES ONE HOUR PRIOR TO DENTALPROCEDURE 30 capsule 0   apixaban (ELIQUIS) 5 MG TABS tablet TAKE ONE TABLET TWICE DAILY 180 tablet 2   BRIVIACT 50 MG TABS Take 1 tablet by mouth 2 (two) times daily.     butalbital-acetaminophen-caffeine (FIORICET) 50-325-40 MG tablet TAKE 1 TABLET BY MOUTH ONCE DAILY AS NEEDED FOR BACK PAIN OR MIGRAINE MAX OF 3 TABS PER DAY  90 tablet 0   calcium carbonate (CALCIUM 600) 600 MG TABS tablet      Cholecalciferol (VITAMIN D) 2000 units CAPS Take 2,000 Units by mouth daily.     fluticasone (FLONASE) 50 MCG/ACT nasal spray USE 2 SPRAYS IN BOTH NOSTRILS DAILY 16 g 11   folic acid (FOLVITE) 1 MG tablet Take 1 mg by mouth daily.     furosemide (LASIX) 40 MG tablet TAKE ONE (1) TABLET BY MOUTH TWICE PER WEEK; TAKE AN ADDITIONAL HALF (1/2) TO ONE (1) TABLET AS NEEDED FOR FLUID RETENTION 90 tablet 1   gabapentin (NEURONTIN) 100 MG capsule Take 200 mg by mouth 3 (three) times daily.     lamoTRIgine (LAMICTAL) 200 MG tablet Take 150 mg in the am & 200 mg in the pm.     losartan (COZAAR) 25 MG tablet TAKE ONE-HALF (1/2) TABLET BY MOUTH EVERY DAY 45 tablet 2   metaxalone (SKELAXIN) 800 MG tablet TAKE ONE TABLET BY MOUTH 3 TIMES DAILY 90 tablet 1   metoprolol succinate (TOPROL-XL) 25 MG 24 hr tablet TAKE ONE TABLET (25 MG) BY MOUTH EVERY DAY 90 tablet 0   montelukast (SINGULAIR) 10 MG tablet TAKE ONE  TABLET BY MOUTH AT BEDTIME 90 tablet 1   polyethylene glycol powder (GLYCOLAX/MIRALAX) 17 GM/SCOOP powder Take 1 Dose by mouth daily.     rosuvastatin (CRESTOR) 40 MG tablet TAKE ONE TABLET EVERY DAY 90 tablet 1   spironolactone (ALDACTONE) 25 MG tablet Take 0.5 tablets (12.5 mg total) by mouth every other day. 45 tablet 0   VENTOLIN HFA 108 (90 Base) MCG/ACT inhaler INHALE 2 PUFFS INTO THE LUNGS EVERY 6 HOURS AS NEEDED FOR WHEEZING OR SHORTNESS OF BREATH 18 g 1   vitamin B-12 (CYANOCOBALAMIN) 1000 MCG tablet Take 1,000 mcg by mouth daily.     No current facility-administered medications for this visit.    Allergies:   Acetaminophen-codeine, Codeine, Epinephrine, Hydrocodone, Hydromorphone, Molds & smuts, Oxycodone, Meloxicam, Codeine, Dextromethorphan, Diphen [diphenhydramine hcl], Diphenhydramine, Diphenhydramine hcl, Diphenylpyraline, Doxycycline, Doxycycline, Hydrocodone, Hydromorphone, Meloxicam, Mobic [meloxicam], Nsaids, Nsaids, Oxycodone, Propofol, and Propofol   Social History:  The patient  reports that she has never smoked. She has never used smokeless tobacco. She reports that she does not drink alcohol and does not use drugs.   Family History:  The patient's family history includes Arrhythmia in her brother and mother; Atrial fibrillation in her son; Celiac disease in her son; Heart failure in her mother; Hypertension in her mother; Liver cancer in her maternal grandmother; Prostate cancer in her brother; Stroke in her father; Stroke (age of onset: 58) in her brother; Stroke (age of onset: 43) in her maternal aunt.  ROS:  Please see the history of present illness.    All other systems are reviewed and otherwise negative.   PHYSICAL EXAM:  VS:  BP (!) 100/58   Pulse 75   Wt 148 lb 9.6 oz (67.4 kg)   SpO2 97%   BMI 24.73 kg/m  BMI: Body mass index is 24.73 kg/m. Well nourished, well developed, in no acute distress HEENT: normocephalic, atraumatic Neck: no JVD, carotid bruits  or masses Cardiac:  RRR; no significant murmurs, no rubs, or gallops Lungs:  CTA b/l, no wheezing, rhonchi or rales Abd: soft, nontender MS: arthritic deformities of her hands, thin, age appropriate atrophy Ext: she has some degree of rubor and somewhat petechial looking rash to b/l LE, 2+ edema RLE to mid shin, trace on the left Skin: warm and dry,  no rash Neuro:  No gross deficits appreciated Psych: euthymic mood, full affect  PPM site is stable, no tethering or discomfort   EKG:  done today and reviewed by myslef AV paced, 75bpm  Device interrogation done today and reviewed by myself:  Battery and lead measurements are good Paced V at 40bpm One old NSVT    11/17/2020 TTE 1. Left ventricular ejection fraction, by estimation, is 35 to 40%. The  left ventricle has moderately decreased function. The left ventricle  demonstrates global hypokinesis. Left ventricular diastolic parameters are  indeterminate. The average left  ventricular global longitudinal strain is -6.7 %.   2. Right ventricular systolic function is normal. The right ventricular  size is normal. There is normal pulmonary artery systolic pressure. The  estimated right ventricular systolic pressure is 35.4 mmHg.   3. Left atrial size was mildly dilated.   4. The mitral valve has been repaired/replaced. No evidence of mitral  valve regurgitation. No evidence of mitral stenosis. The mean mitral valve  gradient is 2.0 mmHg. There is a bioprosthetic valve present in the mitral  position. Procedure Date:  10/2015.   5. Tricuspid valve regurgitation is moderate.   10/18/2015: LHC Patent coronary arteries without significant obstruction 2. Severe mitral regurgitation by hemodynamic and angiographic assessment 3. There is a small diastolic gradient on simultaneous LV/PCWP measurement. There are large V waves present. Valve area calculation is not calculated.  Recent Labs: 10/03/2022: ALT 17; BUN 31; Creatinine, Ser  1.16; Hemoglobin 10.2; Platelets 138; Potassium 3.7; Sodium 138  No results found for requested labs within last 365 days.   CrCl cannot be calculated (Patient's most recent lab result is older than the maximum 21 days allowed.).   Wt Readings from Last 3 Encounters:  11/13/22 148 lb 9.6 oz (67.4 kg)  10/03/22 148 lb (67.1 kg)  08/28/22 146 lb 9.6 oz (66.5 kg)     Other studies reviewed: Additional studies/records reviewed today include: summarized above  ASSESSMENT AND PLAN:  PPM Intact function No programming changes made  Paroxysmal Afib CHA2DS2Vasc is 8, on Eliquis, appropriately dosed S/p MAZE, LAA clip zero % burden   VHD S/p MVR (bioprosthetic) Functioning well by her last echo Not on ASA, will defer to her hematologist/Dr. Rockey Situ   NICM Stable LVEF for last couple echos, 65-68% Mixed diastolic/systolic) On BB/ARB, spironolactone Lungs are clear, she has some asymmetrical edema, low suspicion of DVT given her [facility] Advised that she take her PRN lasix dose ('20mg'$ ) daily for 5 days and elevate her feet when seated, continue her support stocking And let us or Dr. Rockey Situ know if not improved/resolved She is scheduled for an echo next month and due to see Dr. Rockey Situ after that   5. LE rash/rubor She reports chronic for years, unchanged No symptoms of claudication She follows with hematology, just saw them  6. Emotional and reports significant anxiety Difficult sleep She reports having discussed with her PMD< but she does not want to try medications Will refer to Dr. Michail Sermon   Disposition: F/u with remotes as usual and in clinic with EP otherwise in a year, sooner  if needed  Current medicines are reviewed at length with the patient today.  The patient did not have any concerns regarding medicines.  Venetia Night, PA-C 11/13/2022 4:05 PM     [address] [place] [address] [phone] (office)  [phone] (fax)

## 2022-11-12 ENCOUNTER — Other Ambulatory Visit: Payer: Self-pay

## 2022-11-12 MED ORDER — SPIRONOLACTONE 25 MG PO TABS: 12.5000 mg | ORAL_TABLET | ORAL | 0 refills | Status: DC

## 2022-11-12 MED ORDER — SPIRONOLACTONE 25 MG PO TABS: 12.5000 mg | ORAL_TABLET | Freq: Every day | ORAL | 0 refills | Status: DC

## 2022-11-12 NOTE — Telephone Encounter (Signed)
Patient notified and is taking Spironolactone 25 mg 1/2 tablet every other day.  Rx sent in for correction also see telephone note from June 2023.

## 2022-11-13 ENCOUNTER — Encounter: Payer: Self-pay | Admitting: Physician Assistant

## 2022-11-13 ENCOUNTER — Ambulatory Visit: Payer: Medicare Other | Attending: Physician Assistant | Admitting: Physician Assistant

## 2022-11-13 VITALS — BP 100/58 | HR 75 | Wt 148.6 lb

## 2022-11-13 DIAGNOSIS — I428 Other cardiomyopathies: Secondary | ICD-10-CM

## 2022-11-13 DIAGNOSIS — F419 Anxiety disorder, unspecified: Secondary | ICD-10-CM

## 2022-11-13 DIAGNOSIS — I6523 Occlusion and stenosis of bilateral carotid arteries: Secondary | ICD-10-CM

## 2022-11-13 DIAGNOSIS — Z950 Presence of cardiac pacemaker: Secondary | ICD-10-CM | POA: Diagnosis present

## 2022-11-13 DIAGNOSIS — Z79899 Other long term (current) drug therapy: Secondary | ICD-10-CM | POA: Diagnosis not present

## 2022-11-13 DIAGNOSIS — I480 Paroxysmal atrial fibrillation: Secondary | ICD-10-CM | POA: Diagnosis present

## 2022-11-13 DIAGNOSIS — I5022 Chronic systolic (congestive) heart failure: Secondary | ICD-10-CM | POA: Diagnosis not present

## 2022-11-13 DIAGNOSIS — Z952 Presence of prosthetic heart valve: Secondary | ICD-10-CM | POA: Diagnosis present

## 2022-11-13 LAB — CUP PACEART INCLINIC DEVICE CHECK
Battery Impedance: 1045 Ohm
Battery Remaining Longevity: 51 mo
Battery Voltage: 2.77 V
Brady Statistic AP VP Percent: 83 %
Brady Statistic AP VS Percent: 0 %
Brady Statistic AS VP Percent: 17 %
Brady Statistic AS VS Percent: 0 %
Date Time Interrogation Session: 20231213165403
Implantable Lead Connection Status: 753985
Implantable Lead Connection Status: 753985
Implantable Lead Implant Date: 20161205
Implantable Lead Implant Date: 20170321
Implantable Lead Location: 753859
Implantable Lead Location: 753860
Implantable Lead Model: 5076
Implantable Lead Model: 5076
Implantable Pulse Generator Implant Date: 20161205
Lead Channel Impedance Value: 428 Ohm
Lead Channel Impedance Value: 714 Ohm
Lead Channel Pacing Threshold Amplitude: 0.625 V
Lead Channel Pacing Threshold Amplitude: 0.75 V
Lead Channel Pacing Threshold Amplitude: 0.75 V
Lead Channel Pacing Threshold Amplitude: 0.75 V
Lead Channel Pacing Threshold Pulse Width: 0.4 ms
Lead Channel Pacing Threshold Pulse Width: 0.4 ms
Lead Channel Pacing Threshold Pulse Width: 0.4 ms
Lead Channel Pacing Threshold Pulse Width: 0.4 ms
Lead Channel Sensing Intrinsic Amplitude: 0.5 mV
Lead Channel Setting Pacing Amplitude: 2 V
Lead Channel Setting Pacing Amplitude: 2.5 V
Lead Channel Setting Pacing Pulse Width: 0.4 ms
Lead Channel Setting Sensing Sensitivity: 4 mV
Zone Setting Status: 755011
Zone Setting Status: 755011

## 2022-11-13 NOTE — Patient Instructions (Signed)
Medication Instructions:   NEXT FIVE DAYS ONLY: TAKE EXTRA  1/2 TABLET OF  FUROSEMIDE                          *If you need a refill on your cardiac medications before your next appointment, please call your pharmacy*   Lab Work:  BMET TODAY    If you have labs (blood work) drawn today and your tests are completely normal, you will receive your results only by: [place] (if you have MyChart) OR A paper copy in the mail If you have any lab test that is abnormal or we need to change your treatment, we will call you to review the results.   Testing/Procedures: NONE ORDERED  TODAY      Follow-Up: At [facility], you and your health needs are our priority.  As part of our continuing mission to provide you with exceptional heart care, we have created designated Provider Care Teams.  These Care Teams include your primary Cardiologist (physician) and Advanced Practice Providers (APPs -  Physician Assistants and Nurse Practitioners) who all work together to provide you with the care you need, when you need it.  We recommend signing up for the patient portal called "MyChart".  Sign up information is provided on this After Visit Summary.  MyChart is used to connect with patients for Virtual Visits (Telemedicine).  Patients are able to view lab/test results, encounter notes, upcoming appointments, etc.  Non-urgent messages can be sent to your provider as well.   To learn more about what you can do with MyChart, go to [email or web address].    Your next appointment:   AFTER ECHO .. 1 month(s)  The format for your next appointment:   In Person  Provider:   You may see Dr Candis Musa   Other Instructions   Important Information About [place]

## 2022-11-13 NOTE — Telephone Encounter (Signed)
MyChart messgae sent to patient. 

## 2022-11-14 LAB — BASIC METABOLIC PANEL
BUN/Creatinine Ratio: 36 — ABNORMAL HIGH (ref 12–28)
BUN: 42 mg/dL — ABNORMAL HIGH (ref 8–27)
CO2: 25 mmol/L (ref 20–29)
Calcium: 9.9 mg/dL (ref 8.7–10.3)
Chloride: 101 mmol/L (ref 96–106)
Creatinine, Ser: 1.17 mg/dL — ABNORMAL HIGH (ref 0.57–1.00)
Glucose: 92 mg/dL (ref 70–99)
Potassium: 4.8 mmol/L (ref 3.5–5.2)
Sodium: 140 mmol/L (ref 134–144)
eGFR: 47 mL/min/{1.73_m2} — ABNORMAL LOW (ref >59)

## 2022-11-15 ENCOUNTER — Other Ambulatory Visit: Payer: Self-pay | Admitting: *Deleted

## 2022-11-15 DIAGNOSIS — Z79899 Other long term (current) drug therapy: Secondary | ICD-10-CM

## 2022-11-15 NOTE — Telephone Encounter (Signed)
I advised that She take her PRN dose of lasix for 5 days, elevate her feet when seated and wear her support stocking plan to do the echo as scheduled and to f/u with Dr. Rockey Situ after that.  Renee

## 2022-11-24 DIAGNOSIS — S4292XA Fracture of left shoulder girdle, part unspecified, initial encounter for closed fracture: Secondary | ICD-10-CM | POA: Insufficient documentation

## 2022-11-26 ENCOUNTER — Ambulatory Visit (INDEPENDENT_AMBULATORY_CARE_PROVIDER_SITE_OTHER): Payer: Medicare Other

## 2022-11-26 DIAGNOSIS — I428 Other cardiomyopathies: Secondary | ICD-10-CM

## 2022-11-27 ENCOUNTER — Other Ambulatory Visit: Payer: Self-pay

## 2022-11-27 ENCOUNTER — Ambulatory Visit: Payer: Medicare Other | Attending: Internal Medicine

## 2022-11-27 DIAGNOSIS — I5022 Chronic systolic (congestive) heart failure: Secondary | ICD-10-CM

## 2022-11-27 LAB — CUP PACEART REMOTE DEVICE CHECK
Battery Impedance: 1127 Ohm
Battery Remaining Longevity: 47 mo
Battery Voltage: 2.77 V
Brady Statistic AP VP Percent: 81 %
Brady Statistic AP VS Percent: 0 %
Brady Statistic AS VP Percent: 19 %
Brady Statistic AS VS Percent: 0 %
Date Time Interrogation Session: 20231226180853
Implantable Lead Connection Status: 753985
Implantable Lead Connection Status: 753985
Implantable Lead Implant Date: 20161205
Implantable Lead Implant Date: 20170321
Implantable Lead Location: 753859
Implantable Lead Location: 753860
Implantable Lead Model: 5076
Implantable Lead Model: 5076
Implantable Pulse Generator Implant Date: 20161205
Lead Channel Impedance Value: 406 Ohm
Lead Channel Impedance Value: 622 Ohm
Lead Channel Pacing Threshold Amplitude: 0.75 V
Lead Channel Pacing Threshold Amplitude: 0.75 V
Lead Channel Pacing Threshold Pulse Width: 0.4 ms
Lead Channel Pacing Threshold Pulse Width: 0.4 ms
Lead Channel Setting Pacing Amplitude: 2 V
Lead Channel Setting Pacing Amplitude: 2.5 V
Lead Channel Setting Pacing Pulse Width: 0.4 ms
Lead Channel Setting Sensing Sensitivity: 4 mV
Zone Setting Status: 755011
Zone Setting Status: 755011

## 2022-11-27 LAB — BASIC METABOLIC PANEL
BUN/Creatinine Ratio: 17 (ref 12–28)
BUN: 18 mg/dL (ref 8–27)
CO2: 28 mmol/L (ref 20–29)
Calcium: 9.8 mg/dL (ref 8.7–10.3)
Chloride: 102 mmol/L (ref 96–106)
Creatinine, Ser: 1.03 mg/dL — ABNORMAL HIGH (ref 0.57–1.00)
Glucose: 88 mg/dL (ref 70–99)
Potassium: 4.5 mmol/L (ref 3.5–5.2)
Sodium: 140 mmol/L (ref 134–144)
eGFR: 54 mL/min/{1.73_m2} — ABNORMAL LOW (ref >59)

## 2022-12-02 ENCOUNTER — Other Ambulatory Visit: Payer: Self-pay | Admitting: Cardiovascular Disease

## 2022-12-02 DIAGNOSIS — I4819 Other persistent atrial fibrillation: Secondary | ICD-10-CM

## 2022-12-03 NOTE — Telephone Encounter (Signed)
Prescription refill request for Eliquis received. Indication:afib Last office visit:12/23 Scr:1.0 Age: 82 Weight:67.4  kg  Prescription refilled

## 2022-12-11 ENCOUNTER — Ambulatory Visit: Payer: Medicare Other | Attending: Cardiovascular Disease

## 2022-12-11 DIAGNOSIS — I5022 Chronic systolic (congestive) heart failure: Secondary | ICD-10-CM

## 2022-12-11 LAB — ECHOCARDIOGRAM COMPLETE
AR max vel: 2.5 cm2
AV Area VTI: 2.77 cm2
AV Area mean vel: 2.32 cm2
AV Mean grad: 2 mmHg
AV Peak grad: 2.9 mmHg
Ao pk vel: 0.85 m/s
Area-P 1/2: 3.93 cm2
MV VTI: 1.37 cm2
S' Lateral: 3.7 cm

## 2022-12-11 MED ORDER — PERFLUTREN LIPID MICROSPHERE
1.0000 mL | INTRAVENOUS | Status: AC | PRN
  Administered 2022-12-11: 2 mL via INTRAVENOUS

## 2022-12-14 ENCOUNTER — Encounter: Payer: Self-pay | Admitting: Cardiovascular Disease

## 2022-12-18 ENCOUNTER — Ambulatory Visit: Payer: Medicare Other | Admitting: Psychologist

## 2022-12-19 NOTE — Progress Notes (Signed)
Remote pacemaker transmission.   

## 2022-12-23 ENCOUNTER — Ambulatory Visit: Payer: Medicare Other | Admitting: Psychologist

## 2022-12-31 ENCOUNTER — Ambulatory Visit (INDEPENDENT_AMBULATORY_CARE_PROVIDER_SITE_OTHER): Payer: Medicare Other | Admitting: Psychologist

## 2022-12-31 DIAGNOSIS — F411 Generalized anxiety disorder: Secondary | ICD-10-CM | POA: Diagnosis not present

## 2022-12-31 NOTE — Progress Notes (Signed)
[facility] Counselor Initial Adult Exam  Name: [name] [name] Date: 12/31/2022 MRN: [id number] DOB: 1941-08-05 PCP: [name], MD  Time spent: 02:02 pm to 02:45 pm; total time: 43 minutes  This session was held via in person. The patient consented to in-person therapy and was in the clinician's office. Limits of confidentiality were discussed with the patient.    Guardian/Payee:  NA    Paperwork requested: No   Reason for Visit /Presenting Problem: Anxiety  Mental Status Exam: Appearance:   Well Groomed     Behavior:  Appropriate  Motor:  Normal  Speech/Language:   Clear and Coherent  Affect:  Appropriate  Mood:  normal  Thought process:  normal  Thought content:    WNL  Sensory/Perceptual disturbances:    WNL  Orientation:  oriented to person, place, and time/date  Attention:  Good  Concentration:  Good  Memory:  WNL  Fund of knowledge:   Good  Insight:    Good  Judgment:   Good  Impulse Control:  Good    Reported Symptoms:  The patient endorsed experiencing the following: racing thoughts, feeling on edge, feeling restless, unable to control worries, and easily overwhelmed by triggers. She denied suicidal and homicidal ideation.   Risk Assessment: Danger to Self:  No Self-injurious Behavior: No Danger to Others: No Duty to Warn:no Physical Aggression / Violence:No  Access to Firearms a concern: No  Gang Involvement:No  Patient / guardian was educated about steps to take if suicide or homicide risk level increases between visits: n/a While future psychiatric events cannot be accurately predicted, the patient does not currently require acute inpatient psychiatric care and does not currently meet [facility] involuntary commitment criteria.  Substance Abuse History: Current substance abuse: No     Past Psychiatric History:   Previous psychological history is significant for anxiety Outpatient Providers:Dr. Rexene Edison History of Psych  Hospitalization: No  Psychological Testing:  NA    Abuse History:  Victim of: No.,  NA    Report needed: No. Victim of Neglect:No. Perpetrator of  NA Witness / Exposure to Domestic Violence: No   Protective Services Involvement: No  Witness to [employer] Violence:  No   Family History:  Family History  Problem Relation Age of Onset   Hypertension Mother    Arrhythmia Mother    Heart failure Mother    Arrhythmia Brother    Stroke Brother 22       cerebral hemorrhage, nonsmoker, no HTN   Prostate cancer Brother    Stroke Father        from an aneurysm   Stroke Maternal Aunt 83       cerebral hemorrhage   Liver cancer Maternal Grandmother    Atrial fibrillation Son    Celiac disease Son    Heart attack Neg Hx    Breast cancer Neg Hx     Living situation: the patient lives alone  Sexual Orientation: Straight  Relationship Status: widowed  Name of spouse / other:na If a parent, number of children / ages:Two children. 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 History of legal issue / charges:  NA  Medical History/Surgical History: reviewed Past Medical History:  Diagnosis Date   Acute on chronic diastolic (congestive) heart failure ([place])    Allergy    See list   Anemia 2018   Anxiety    Occasionally take Xanax for sleep   Anxiety associated with depression    Prn alprazolam    Anxiety associated with depression     Arthritis    Hands, Back   Atrial fibrillation, persistent ([place])    DCCV 08/22/2015   Bradycardia post-op bradycardia, pacer dependent   MDT PPM 11/06/15, Dr. Lovena Le   Cataract    Left eye   Chronic kidney disease (CKD) stage G3a/A2, moderately decreased glomerular filtration rate (GFR) between 45-59 mL/min/1.73 square meter and albuminuria creatinine ratio between 30-299 mg/g ([place]) 11/13/2021   Diverticulosis    Focal seizures ([place])    Heart murmur    Hematuria, gross 04/09/2018   Hemorrhoid    Hepatic cyst    innumerable   History of asbestos exposure    Hyperlipidemia    IBS (irritable bowel syndrome)    Idiopathic thrombocytopenic purpura (ITP) (HCC)    Migraine    MVP (mitral valve prolapse)    Nodule of right lung    Osteoporosis of forearm    RBBB    Restrictive lung disease    Mild on PFT & likely cardiac in etiology    Right sided sciatica 04/10/2022   S/P Minimally invasive maze operation for atrial fibrillation 10/31/2015   Complete bilateral atrial lesion set using cryothermy and bipolar radiofrequency ablation with clipping of LA appendage via right mini thoracotomy approach   S/P minimally invasive mitral valve replacement with bioprosthetic valve 10/31/2015   33 mm Edwards Magna Mitral bovine bioprosthetic tissue valve placed via right mini thoracotomy approach   Seizures ([place])    left foot paralysis and left hand paralysis Dr. 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   SVT (supraventricular tachycardia)    Thoracic aorta atherosclerosis (HCC)    TIA (transient ischemic attack)     Past Surgical History:  Procedure Laterality Date   BREAST EXCISIONAL BIOPSY Right Late 80s   Negative X2   CARDIAC CATHETERIZATION N/A 10/18/2015   Procedure: Right/Left Heart Cath and Coronary Angiography;  Surgeon: Sherren Mocha, MD;  Location: [place] [facility];  Service: Cardiovascular;  Laterality: N/A;   CARDIOVERSION N/A  08/22/2015   Procedure: CARDIOVERSION;  Surgeon: Thayer Headings, MD;  Location: [facility] ENDOSCOPY;  Service: Cardiovascular;  Laterality: N/A;   COLONOSCOPY  2003   EP IMPLANTABLE DEVICE N/A 11/06/2015   Procedure: Pacemaker Implant;  Surgeon: Evans Lance, MD;  Location: [place] [facility];  Service: Cardiovascular;  Laterality: N/A;   EP IMPLANTABLE DEVICE N/A 02/20/2016   Procedure: Lead Extraction;  Surgeon: Evans Lance, MD;  Location: [place] [facility];  Service: Cardiovascular;  Laterality: N/A;   EYE SURGERY Right 2013   FEMUR IM NAIL Left 06/18/2021   Procedure: INTRAMEDULLARY (IM) NAIL FEMORAL, OPEN REDUCTION INTERNAL FIXATION FEMUR RIGHT LITTLE FINGER PIP CLOSED REDUCTION;  Surgeon: Altamese Ruskin, MD;  Location: [facility];  Service: Orthopedics;  Laterality: Left;   FOOT SURGERY     ~2007 right foot bunion   MANDIBLE FRACTURE SURGERY  03/26/2013   MINIMALLY INVASIVE MAZE PROCEDURE  N/A 10/31/2015   Procedure: MINIMALLY INVASIVE MAZE PROCEDURE;  Surgeon: Rexene Alberts, MD;  Location: [place];  Service: Open Heart Surgery;  Laterality: N/A;   MITRAL VALVE REPLACEMENT Right 10/31/2015   Procedure: MINIMALLY INVASIVE MITRAL VALVE (MV) REPLACEMENT;  Surgeon: Rexene Alberts, MD;  Location: [place];  Service: Open Heart Surgery;  Laterality: Right;   PACEMAKER LEAD REMOVAL  02/20/2016   TEE WITH CARDIOVERSION     TEE WITHOUT CARDIOVERSION N/A 08/22/2015   Procedure: TRANSESOPHAGEAL ECHOCARDIOGRAM (TEE);  Surgeon: Thayer Headings, MD;  Location: [place];  Service: Cardiovascular;  Laterality: N/A;   TEE WITHOUT CARDIOVERSION N/A 10/31/2015   Procedure: TRANSESOPHAGEAL ECHOCARDIOGRAM (TEE);  Surgeon: Rexene Alberts, MD;  Location: [place];  Service: Open Heart Surgery;  Laterality: N/A;   TUBAL LIGATION     VARICOSE VEIN SURGERY Right     Medications: Current Outpatient Medications  Medication Sig Dispense Refill   amoxicillin (AMOXIL) 500 MG capsule TAKE 4 CAPSULES ONE HOUR PRIOR TO  DENTALPROCEDURE 30 capsule 0   BRIVIACT 50 MG TABS Take 1 tablet by mouth 2 (two) times daily.     butalbital-acetaminophen-caffeine (FIORICET) 50-325-40 MG tablet TAKE 1 TABLET BY MOUTH ONCE DAILY AS NEEDED FOR BACK PAIN OR MIGRAINE MAX OF 3 TABS PER DAY 90 tablet 0   calcium carbonate (CALCIUM 600) 600 MG TABS tablet      Cholecalciferol (VITAMIN D) 2000 units CAPS Take 2,000 Units by mouth daily.     ELIQUIS 5 MG TABS tablet TAKE ONE TABLET TWICE DAILY 180 tablet 2   fluticasone (FLONASE) 50 MCG/ACT nasal spray USE 2 SPRAYS IN BOTH NOSTRILS DAILY 16 g 11   folic acid (FOLVITE) 1 MG tablet Take 1 mg by mouth daily.     furosemide (LASIX) 40 MG tablet TAKE ONE (1) TABLET BY MOUTH TWICE PER WEEK; TAKE AN ADDITIONAL HALF (1/2) TO ONE (1) TABLET AS NEEDED FOR FLUID RETENTION 90 tablet 1   gabapentin (NEURONTIN) 100 MG capsule Take 200 mg by mouth 3 (three) times daily.     lamoTRIgine (LAMICTAL) 200 MG tablet Take 150 mg in the am & 200 mg in the pm.     losartan (COZAAR) 25 MG tablet TAKE ONE-HALF (1/2) TABLET BY MOUTH EVERY DAY 45 tablet 2   metaxalone (SKELAXIN) 800 MG tablet TAKE ONE TABLET BY MOUTH 3 TIMES DAILY 90 tablet 1   metoprolol succinate (TOPROL-XL) 25 MG 24 hr tablet TAKE ONE TABLET (25 MG) BY MOUTH EVERY DAY 90 tablet 0   montelukast (SINGULAIR) 10 MG tablet TAKE ONE TABLET BY MOUTH AT BEDTIME 90 tablet 1   polyethylene glycol powder (GLYCOLAX/MIRALAX) 17 GM/SCOOP powder Take 1 Dose by mouth daily.     rosuvastatin (CRESTOR) 40 MG tablet TAKE ONE TABLET EVERY DAY 90 tablet 1   spironolactone (ALDACTONE) 25 MG tablet Take 0.5 tablets (12.5 mg total) by mouth every other day. 45 tablet 0   VENTOLIN HFA 108 (90 Base) MCG/ACT inhaler INHALE 2 PUFFS INTO THE LUNGS EVERY 6 HOURS AS NEEDED FOR WHEEZING OR SHORTNESS OF BREATH 18 g 1   vitamin B-12 (CYANOCOBALAMIN) 1000 MCG tablet Take 1,000 mcg by mouth daily.     No current facility-administered medications for this visit.    Allergies   Allergen Reactions   Acetaminophen-Codeine Other (See Comments)   Codeine Nausea And Vomiting and Other (See Comments)    migraine   Epinephrine Palpitations   Hydrocodone Nausea And Vomiting and Other (See Comments)  MIGRAINE   Hydromorphone Nausea And Vomiting and Other (See Comments)    migraine   Molds & Smuts Anxiety, Other (See Comments) and Shortness Of Breath   Oxycodone Nausea And Vomiting and Other (See Comments)    Severe migraine   Meloxicam Other (See Comments)    Severe reflux   Codeine Other (See Comments)    Migraine   Dextromethorphan Other (See Comments)    seizures   Diphen [Diphenhydramine Hcl] Other (See Comments)    seizure   Diphenhydramine Other (See Comments)    seizure   Diphenhydramine Hcl     Other reaction(s): Other (See Comments)   Diphenylpyraline Other (See Comments)   Doxycycline Itching, Swelling and Other (See Comments)    Facial   Doxycycline Itching and Swelling   Hydrocodone Other (See Comments)    Migraine   Hydromorphone Other (See Comments)    Migraine   Meloxicam Other (See Comments)   Mobic [Meloxicam] Other (See Comments)    reflux   Nsaids    Nsaids Other (See Comments)    Pt on blood thinner   Oxycodone Other (See Comments)    Migraine   Propofol Other (See Comments)   Propofol Other (See Comments)    Very sensitive; patient stated she was told she was told she had apnea    Diagnoses:  F41.1 generalized anxiety disorder  Plan of Care: The patient is a 82 year old Caucasian female who was referred due to experiencing anxiety. The patient lives alone. The patient meets criteria for a diagnosis of F41.1 generalized anxiety disorder based off of the following: racing thoughts, feeling on edge, feeling restless, unable to control worries, and easily overwhelmed by triggers. She denied suicidal and homicidal ideation. Patient should be ruled out for depression and a trauma related disorder.   The patient stated that she  wants coping skills  This psychologist makes the recommendation that the patient participate in therapy bi-weekly.    Conception Chancy, PsyD

## 2022-12-31 NOTE — Progress Notes (Signed)
                Junia Nygren, PsyD 

## 2023-01-07 ENCOUNTER — Other Ambulatory Visit: Payer: Self-pay | Admitting: Cardiovascular Disease

## 2023-01-07 DIAGNOSIS — I5022 Chronic systolic (congestive) heart failure: Secondary | ICD-10-CM

## 2023-01-09 ENCOUNTER — Other Ambulatory Visit: Payer: Self-pay | Admitting: Internal Medicine

## 2023-01-09 ENCOUNTER — Encounter (HOSPITAL_COMMUNITY): Payer: Self-pay | Admitting: *Deleted

## 2023-01-15 ENCOUNTER — Ambulatory Visit (INDEPENDENT_AMBULATORY_CARE_PROVIDER_SITE_OTHER): Payer: Medicare Other | Admitting: Psychologist

## 2023-01-15 ENCOUNTER — Ambulatory Visit: Payer: Medicare Other | Admitting: Psychologist

## 2023-01-15 DIAGNOSIS — F411 Generalized anxiety disorder: Secondary | ICD-10-CM | POA: Diagnosis not present

## 2023-01-15 NOTE — Progress Notes (Signed)
                Shareese Macha, PsyD 

## 2023-01-15 NOTE — Progress Notes (Signed)
[facility] Counselor/Therapist Progress Note  Patient ID: [name] [name], MRN: [id number],    Date: 01/15/2023  Time Spent: 02:05 pm to 02:47 pm; total time: 42 minutes   This session was held via phone teletherapy due to the coronavirus risk at this time. The patient consented to phone teletherapy and was located at her home during this session. She is aware it is the responsibility of the patient to secure confidentiality on her end of the session. The provider was in a private home office for the duration of this session. Limits of confidentiality were discussed with the patient.   Treatment Type: Individual Therapy  Reported Symptoms: Anxiety  Mental Status Exam: Appearance:  Well Groomed     Behavior: Appropriate  Motor: Normal  Speech/Language:  Clear and Coherent  Affect: Appropriate  Mood: normal  Thought process: normal  Thought content:   WNL  Sensory/Perceptual disturbances:   WNL  Orientation: oriented to person, place, and time/date  Attention: Good  Concentration: Good  Memory: WNL  Fund of knowledge:  Good  Insight:   Fair  Judgment:  Good  Impulse Control: Good   Risk Assessment: Danger to Self:  No Self-injurious Behavior: No Danger to Others: No Duty to Warn:no Physical Aggression / Violence:No  Access to Firearms a concern: No  Gang Involvement:No   Subjective: Beginning the session, patient described herself as doing well. After reviewing the treatment plan, patient reflected on previously used strategies that have assisted her including visiting places. She voiced concern related to traveling due to her decline in health. She also participated in guided imagery. Patient processed thoughts and emotions. She was agreeable to homework and following up. She denied suicidal and homicidal ideation.    Interventions:  Worked on developing a therapeutic relationship with the patient using active listening and reflective statements. Provided  emotional support using empathy and validation. Reviewed the treatment plan with the patient. Reviewed events since the intake. Normalized and validated thoughts and emotions. Identified goals for the session. Used solution focused therapy to assist the patient. Used socratic questions to assist the patient gain insight into self. Practiced guided imagery with the patient. Assisted in problem solving. Assigned homework. Provided empathic statements. Assessed for suicidal and homicidal ideation.   Homework: Discuss concerns about traveling with medical team and use guided imagery  Next Session: Review homework and emotional support  Diagnosis: F41.1 generalized anxiety disorder  Plan:   Goals Reduce overall frequency, intensity, and duration of anxiety Stabilize anxiety level wile increasing ability to function Enhance ability to effectively cope with full variety of stressors Learn and implement coping skills that result in a reduction of anxiety    Objectives target date for all objectives is 01/01/2024 Verbalize an understanding of the cognitive, physiological, and behavioral components of anxiety Learning and implement calming skills to reduce overall anxiety Verbalize an understanding of the role that cognitive biases play in excessive irrational worry and persistent anxiety symptoms Identify, challenge, and replace based fearful talk Learn and implement problem solving strategies Identify and engage in pleasant activities Learning and implement personal and interpersonal skills to reduce anxiety and improve interpersonal relationships Learn to accept limitations in life and commit to tolerating, rather than avoiding, unpleasant emotions while accomplishing meaningful goals Identify major life conflicts from the past and present that form the basis for present anxiety Maintain involvement in work, family, and social activities Reestablish a consistent sleep-wake cycle Cooperate with a  medical evaluation  Interventions Engage the patient in  behavioral activation Use instruction, modeling, and role-playing to build the client's general social, communication, and/or conflict resolution skills Use Acceptance and Commitment Therapy to help client accept uncomfortable realities in order to accomplish value-consistent goals Reinforce the client's insight into the role of his/her past emotional pain and present anxiety  Support the client in following through with work, family, and social activities Teach and implement sleep hygiene practices  Refer the patient to a physician for a psychotropic medication consultation Monito the clint's psychotropic medication compliance Discuss how anxiety typically involves excessive worry, various bodily expressions of tension, and avoidance of what is threatening that interact to maintain the problem  Teach the patient relaxation skills Assign the patient homework Discuss examples demonstrating that unrealistic worry overestimates the probability of threats and underestimates patient's ability  Assist the patient in analyzing his or her worries Help patient understand that avoidance is reinforcing   The patient and clinician reviewed the treatment plan on 01/15/2023. The patient approved of the treatment plan.   Conception Chancy, PsyD

## 2023-01-19 NOTE — Progress Notes (Deleted)
Cardiology Office Note  Date:  01/19/2023   ID:  Carman, Buwalda 04/22/1941, MRN [id number]  PCP:  [name], MD   No chief complaint on file.   HPI:  [name] [name] is a 82 yo woman with past medical history of Supraventricular tachycardia, prior ablation Mitral valve prolapse - s/p mitral valve replacement  (bioprosthetic)  to right Paroxysmal Afib, on eliquis Cardiac cath 2016: no CAD MVR, maze 2017, dr. 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   Seizures in recovery Followed by Dr. Manuella Ghazi  Would like to start evenity for osteoporosis Side effects and possible cardiac complications reviewed with her  Using a cane, working to get off the cane  EKG personally reviewed by myself on todays visit Shows paced rhythm rate 81 bpm  Other past medical hx 11/22/20 echocardiogram with LVEF 35-40% (stable compared to 2017), LV global hypokinesis, indeterminate LV diastolic parameters, RV normal size and function, normal PASP, stable bioprosthetic mitral valve.  Prior cardiac imaging reviewed Echo 2018: ejection fraction was  in the range of 40% to 45%.  Mitral: bioprosthesis was present and functioning   normally  Cardiac cath 2016:  Patent coronary arteries without significant obstruction Severe mitral regurgitation by hemodynamic and angiographic assessment  PMH:   has a past medical history of Acute on chronic diastolic (congestive) heart failure  ([place]), Allergy, Anemia (2018), Anxiety, Anxiety associated with depression, Anxiety associated with depression, Arthritis, Atrial fibrillation, persistent (HCC), Bradycardia (post-op bradycardia, pacer dependent), Cataract, Chronic kidney disease (CKD) stage G3a/A2, moderately decreased glomerular filtration rate (GFR) between 45-59 mL/min/1.73 square meter and albuminuria creatinine ratio between 30-299 mg/g ([place]) (11/13/2021), Diverticulosis, Focal seizures ([place]), Heart murmur, Hematuria, gross (04/09/2018), Hemorrhoid, Hepatic cyst, History of asbestos exposure, Hyperlipidemia, IBS (irritable bowel syndrome), Idiopathic thrombocytopenic purpura (ITP) ([place]), Migraine, MVP (mitral valve prolapse), Nodule of right lung, Osteoporosis of forearm, RBBB, Restrictive lung disease, Right sided sciatica (04/10/2022), S/P Minimally invasive maze operation for atrial fibrillation (10/31/2015), S/P minimally invasive mitral valve replacement with bioprosthetic valve (10/31/2015), Seizures ([place]), Severe mitral regurgitation, Skin cancer, basal cell (1991), SVT (supraventricular tachycardia), Thoracic aorta atherosclerosis ([place]), and TIA (transient ischemic attack).  PSH:    Past Surgical History:  Procedure Laterality Date   BREAST EXCISIONAL BIOPSY Right Late 80s   Negative X2   CARDIAC CATHETERIZATION N/A 10/18/2015   Procedure: Right/Left Heart Cath and Coronary Angiography;  Surgeon: Sherren Mocha, MD;  Location: [facility] [place] [facility];  Service: Cardiovascular;  Laterality: N/A;   CARDIOVERSION N/A 08/22/2015   Procedure: CARDIOVERSION;  Surgeon: Thayer Headings, MD;  Location: [facility] ENDOSCOPY;  Service: Cardiovascular;  Laterality: N/A;   COLONOSCOPY  2003   EP IMPLANTABLE DEVICE N/A 11/06/2015   Procedure: Pacemaker Implant;  Surgeon: Evans Lance, MD;  Location: [place] CV [facility];  Service: Cardiovascular;  Laterality: N/A;   EP IMPLANTABLE DEVICE N/A 02/20/2016   Procedure: Lead Extraction;  Surgeon: Evans Lance, MD;  Location: [place] [facility];  Service:  Cardiovascular;  Laterality: N/A;   EYE SURGERY Right 2013   FEMUR IM NAIL Left 06/18/2021   Procedure: INTRAMEDULLARY (IM) NAIL FEMORAL, OPEN REDUCTION INTERNAL FIXATION FEMUR RIGHT LITTLE FINGER PIP CLOSED REDUCTION;  Surgeon: Altamese [place], MD;  Location: [facility];  Service: Orthopedics;  Laterality: Left;   FOOT SURGERY     ~2007 right foot bunion   MANDIBLE FRACTURE SURGERY  03/26/2013   MINIMALLY INVASIVE MAZE PROCEDURE N/A 10/31/2015   Procedure: MINIMALLY INVASIVE MAZE PROCEDURE;  Surgeon: Rexene Alberts, MD;  Location: [facility] [facility];  Service: Open Heart Surgery;  Laterality: N/A;   MITRAL VALVE REPLACEMENT Right 10/31/2015   Procedure: MINIMALLY INVASIVE MITRAL VALVE (MV) REPLACEMENT;  Surgeon: Rexene Alberts, MD;  Location: [facility];  Service: Open Heart Surgery;  Laterality: Right;   PACEMAKER LEAD REMOVAL  02/20/2016   TEE WITH CARDIOVERSION     TEE WITHOUT CARDIOVERSION N/A 08/22/2015   Procedure: TRANSESOPHAGEAL ECHOCARDIOGRAM (TEE);  Surgeon: Thayer Headings, MD;  Location: [facility];  Service: Cardiovascular;  Laterality: N/A;   TEE WITHOUT CARDIOVERSION N/A 10/31/2015   Procedure: TRANSESOPHAGEAL ECHOCARDIOGRAM (TEE);  Surgeon: Rexene Alberts, MD;  Location: [facility];  Service: Open Heart Surgery;  Laterality: N/A;   TUBAL LIGATION     VARICOSE VEIN SURGERY Right     Current Outpatient Medications  Medication Sig Dispense Refill   amoxicillin (AMOXIL) 500 MG capsule TAKE 4 CAPSULES ONE HOUR PRIOR TO DENTALPROCEDURE 30 capsule 0   BRIVIACT 50 MG TABS Take 1 tablet by mouth 2 (two) times daily.     butalbital-acetaminophen-caffeine (FIORICET) 50-325-40 MG tablet TAKE 1 TABLET BY MOUTH ONCE DAILY AS NEEDED FOR BACK PAIN OR MIGRAINE MAX OF 3 TABS PER DAY 90 tablet 0   calcium carbonate (CALCIUM 600) 600 MG TABS tablet      Cholecalciferol (VITAMIN D) 2000 units CAPS Take 2,000 Units by mouth daily.     ELIQUIS 5 MG TABS tablet TAKE  ONE TABLET TWICE DAILY 180 tablet 2   fluticasone (FLONASE) 50 MCG/ACT nasal spray USE 2 SPRAYS IN BOTH NOSTRILS DAILY 16 g 11   folic acid (FOLVITE) 1 MG tablet Take 1 mg by mouth daily.     furosemide (LASIX) 40 MG tablet TAKE ONE (1) TABLET BY MOUTH TWICE PER WEEK; TAKE AN ADDITIONAL HALF (1/2) TO ONE (1) TABLET AS NEEDED FOR FLUID RETENTION 90 tablet 1   gabapentin (NEURONTIN) 100 MG capsule Take 200 mg by mouth 3 (three) times daily.     lamoTRIgine (LAMICTAL) 200 MG tablet Take 150 mg in the am & 200 mg in the pm.     losartan (COZAAR) 25 MG tablet TAKE 1/2 TABLET BY MOUTH ONCE DAILY 15 tablet 0   metaxalone (SKELAXIN) 800 MG tablet TAKE ONE TABLET BY MOUTH 3 TIMES DAILY 90 tablet 1   metoprolol succinate (TOPROL-XL) 25 MG 24 hr tablet TAKE ONE TABLET (25 MG) BY MOUTH EVERY DAY 90 tablet 0   montelukast (SINGULAIR) 10 MG tablet TAKE ONE TABLET BY MOUTH AT BEDTIME 90 tablet 1   polyethylene glycol powder (GLYCOLAX/MIRALAX) 17 GM/SCOOP powder Take 1 Dose by mouth daily.     rosuvastatin (CRESTOR) 40 MG tablet TAKE ONE TABLET EVERY DAY 90 tablet 1   spironolactone (ALDACTONE) 25 MG tablet Take 0.5 tablets (12.5 mg total) by mouth every other day. 45 tablet 0   VENTOLIN HFA 108 (90 Base) MCG/ACT inhaler INHALE 2 PUFFS INTO THE LUNGS EVERY 6 HOURS AS NEEDED FOR WHEEZING OR  SHORTNESS OF BREATH 18 g 1   vitamin B-12 (CYANOCOBALAMIN) 1000 MCG tablet Take 1,000 mcg by mouth daily.     No current facility-administered medications for this visit.     Allergies:   Acetaminophen-codeine, Codeine, Epinephrine, Hydrocodone, Hydromorphone, Molds & smuts, Oxycodone, Meloxicam, Codeine, Dextromethorphan, Diphen [diphenhydramine hcl], Diphenhydramine, Diphenhydramine hcl, Diphenylpyraline, Doxycycline, Doxycycline, Hydrocodone, Hydromorphone, Meloxicam, Mobic [meloxicam], Nsaids, Nsaids, Oxycodone, Propofol, and Propofol   Social History:  The patient  reports that she has never smoked. She has never used  smokeless tobacco. She reports that she does not drink alcohol and does not use drugs.   Family History:   family history includes Arrhythmia in her brother and mother; Atrial fibrillation in her son; Celiac disease in her son; Heart failure in her mother; Hypertension in her mother; Liver cancer in her maternal grandmother; Prostate cancer in her brother; Stroke in her father; Stroke (age of onset: 44) in her brother; Stroke (age of onset: 73) in her maternal aunt.    Review of Systems: Review of Systems  Constitutional: Negative.   HENT: Negative.    Respiratory: Negative.    Cardiovascular: Negative.   Gastrointestinal: Negative.   Musculoskeletal: Negative.   Neurological: Negative.   Psychiatric/Behavioral: Negative.    All other systems reviewed and are negative.   PHYSICAL EXAM: VS:  There were no vitals taken for this visit. , BMI There is no height or weight on file to calculate BMI. Constitutional:  oriented to person, place, and time. No distress.  HENT:  Head: Grossly normal Eyes:  no discharge. No scleral icterus.  Neck: No JVD, no carotid bruits  Cardiovascular: Regular rate and rhythm, no murmurs appreciated Pulmonary/Chest: Clear to auscultation bilaterally, no wheezes or rails Abdominal: Soft.  no distension.  no tenderness.  Musculoskeletal: Normal range of motion Neurological:  normal muscle tone. Coordination normal. No atrophy Skin: Skin warm and dry Psychiatric: normal affect, pleasant  Recent Labs: 10/03/2022: ALT 17; Hemoglobin 10.2; Platelets 138 11/27/2022: BUN 18; Creatinine, Ser 1.03; Potassium 4.5; Sodium 140    Lipid Panel Lab Results  Component Value Date   CHOL 143 05/21/2019   HDL 59.50 05/21/2019   LDLCALC 71 05/21/2019   TRIG 60.0 05/21/2019      Wt Readings from Last 3 Encounters:  11/13/22 148 lb 9.6 oz (67.4 kg)  10/03/22 148 lb (67.1 kg)  08/28/22 146 lb 9.6 oz (66.5 kg)     ASSESSMENT AND PLAN:  Problem List Items  Addressed This Visit   None PAF: Tolerating metoprolol succinate 25 daily On anticoagulation stable  SSS, pacer Followed by Dr. Lovena Le  SVT Prior ablation, Continue metoprolol  Seizures Followed by neurology, Exacerbated by anesthesia  Cardiomyopathy Did not tolerate Entresto Continue low-dose losartan spironolactone metoprolol We have previously discussed adding Jardiance or Farxiga, does not want to change meds  Hyperlipidemia Cholesterol is at goal on the current lipid regimen. No changes to the medications were made.  MVR, prior history of prolapse Stable on echo 2021 Stable axam today  Osteoporosis Discussed risk and benefit of Evenity She has had no recent MI or CVA.  No strong contraindication from the limited literature on the medication available to [place].  She would like to proceed with the medication   Total encounter time more than 35 minutes  Greater than 50% was spent in counseling and coordination of care with the patient    Signed, Esmond Plants, M.D., Ph.D. [place], [place]

## 2023-01-20 ENCOUNTER — Ambulatory Visit: Payer: Medicare Other | Admitting: Cardiovascular Disease

## 2023-01-20 DIAGNOSIS — I6523 Occlusion and stenosis of bilateral carotid arteries: Secondary | ICD-10-CM

## 2023-01-20 DIAGNOSIS — I428 Other cardiomyopathies: Secondary | ICD-10-CM

## 2023-01-20 DIAGNOSIS — I480 Paroxysmal atrial fibrillation: Secondary | ICD-10-CM

## 2023-01-20 DIAGNOSIS — I495 Sick sinus syndrome: Secondary | ICD-10-CM

## 2023-01-20 DIAGNOSIS — I5022 Chronic systolic (congestive) heart failure: Secondary | ICD-10-CM

## 2023-01-20 DIAGNOSIS — F419 Anxiety disorder, unspecified: Secondary | ICD-10-CM

## 2023-01-20 DIAGNOSIS — I700 Atherosclerosis of aorta: Secondary | ICD-10-CM

## 2023-01-20 DIAGNOSIS — E782 Mixed hyperlipidemia: Secondary | ICD-10-CM

## 2023-01-20 DIAGNOSIS — Z950 Presence of cardiac pacemaker: Secondary | ICD-10-CM

## 2023-01-20 DIAGNOSIS — Z952 Presence of prosthetic heart valve: Secondary | ICD-10-CM

## 2023-01-26 ENCOUNTER — Inpatient Hospital Stay: Payer: Medicare Other

## 2023-01-26 ENCOUNTER — Encounter: Payer: Self-pay | Admitting: Internal Medicine

## 2023-01-26 ENCOUNTER — Other Ambulatory Visit: Payer: Self-pay

## 2023-01-26 ENCOUNTER — Emergency Department: Payer: Medicare Other

## 2023-01-26 ENCOUNTER — Inpatient Hospital Stay
Admission: EM | Admit: 2023-01-26 | Discharge: 2023-01-28 | DRG: 871 | Disposition: A | Discharge status: Home / Self Care | Payer: Medicare Other | Attending: Internal Medicine | Admitting: Internal Medicine

## 2023-01-26 DIAGNOSIS — I272 Pulmonary hypertension, unspecified: Secondary | ICD-10-CM | POA: Diagnosis present

## 2023-01-26 DIAGNOSIS — Z79899 Other long term (current) drug therapy: Secondary | ICD-10-CM | POA: Diagnosis not present

## 2023-01-26 DIAGNOSIS — Z8249 Family history of ischemic heart disease and other diseases of the circulatory system: Secondary | ICD-10-CM | POA: Diagnosis not present

## 2023-01-26 DIAGNOSIS — Z823 Family history of stroke: Secondary | ICD-10-CM

## 2023-01-26 DIAGNOSIS — N1831 Chronic kidney disease, stage 3a: Secondary | ICD-10-CM | POA: Diagnosis present

## 2023-01-26 DIAGNOSIS — R6510 Systemic inflammatory response syndrome (SIRS) of non-infectious origin without acute organ dysfunction: Secondary | ICD-10-CM | POA: Insufficient documentation

## 2023-01-26 DIAGNOSIS — R54 Age-related physical debility: Secondary | ICD-10-CM | POA: Diagnosis present

## 2023-01-26 DIAGNOSIS — Z885 Allergy status to narcotic agent status: Secondary | ICD-10-CM

## 2023-01-26 DIAGNOSIS — G40909 Epilepsy, unspecified, not intractable, without status epilepticus: Secondary | ICD-10-CM | POA: Diagnosis present

## 2023-01-26 DIAGNOSIS — J189 Pneumonia, unspecified organism: Secondary | ICD-10-CM | POA: Diagnosis present

## 2023-01-26 DIAGNOSIS — I341 Nonrheumatic mitral (valve) prolapse: Secondary | ICD-10-CM | POA: Diagnosis present

## 2023-01-26 DIAGNOSIS — Z950 Presence of cardiac pacemaker: Secondary | ICD-10-CM

## 2023-01-26 DIAGNOSIS — I130 Hypertensive heart and chronic kidney disease with heart failure and stage 1 through stage 4 chronic kidney disease, or unspecified chronic kidney disease: Secondary | ICD-10-CM | POA: Diagnosis present

## 2023-01-26 DIAGNOSIS — I480 Paroxysmal atrial fibrillation: Secondary | ICD-10-CM | POA: Diagnosis present

## 2023-01-26 DIAGNOSIS — Z7901 Long term (current) use of anticoagulants: Secondary | ICD-10-CM | POA: Diagnosis not present

## 2023-01-26 DIAGNOSIS — Y92009 Unspecified place in unspecified non-institutional (private) residence as the place of occurrence of the external cause: Secondary | ICD-10-CM | POA: Diagnosis not present

## 2023-01-26 DIAGNOSIS — R55 Syncope and collapse: Secondary | ICD-10-CM | POA: Diagnosis not present

## 2023-01-26 DIAGNOSIS — Z881 Allergy status to other antibiotic agents status: Secondary | ICD-10-CM | POA: Diagnosis not present

## 2023-01-26 DIAGNOSIS — Z800 Family history of malignant neoplasm of digestive organs: Secondary | ICD-10-CM

## 2023-01-26 DIAGNOSIS — W19XXXA Unspecified fall, initial encounter: Secondary | ICD-10-CM | POA: Diagnosis present

## 2023-01-26 DIAGNOSIS — E785 Hyperlipidemia, unspecified: Secondary | ICD-10-CM | POA: Diagnosis present

## 2023-01-26 DIAGNOSIS — I4819 Other persistent atrial fibrillation: Secondary | ICD-10-CM | POA: Diagnosis present

## 2023-01-26 DIAGNOSIS — A419 Sepsis, unspecified organism: Principal | ICD-10-CM | POA: Diagnosis present

## 2023-01-26 DIAGNOSIS — R569 Unspecified convulsions: Secondary | ICD-10-CM

## 2023-01-26 DIAGNOSIS — Z8379 Family history of other diseases of the digestive system: Secondary | ICD-10-CM

## 2023-01-26 DIAGNOSIS — G9340 Encephalopathy, unspecified: Secondary | ICD-10-CM

## 2023-01-26 DIAGNOSIS — D638 Anemia in other chronic diseases classified elsewhere: Secondary | ICD-10-CM | POA: Diagnosis present

## 2023-01-26 DIAGNOSIS — Z8673 Personal history of transient ischemic attack (TIA), and cerebral infarction without residual deficits: Secondary | ICD-10-CM

## 2023-01-26 DIAGNOSIS — D631 Anemia in chronic kidney disease: Secondary | ICD-10-CM | POA: Diagnosis present

## 2023-01-26 DIAGNOSIS — D696 Thrombocytopenia, unspecified: Secondary | ICD-10-CM | POA: Diagnosis present

## 2023-01-26 DIAGNOSIS — R42 Dizziness and giddiness: Principal | ICD-10-CM

## 2023-01-26 DIAGNOSIS — I4510 Unspecified right bundle-branch block: Secondary | ICD-10-CM | POA: Diagnosis present

## 2023-01-26 DIAGNOSIS — Z8042 Family history of malignant neoplasm of prostate: Secondary | ICD-10-CM | POA: Diagnosis not present

## 2023-01-26 DIAGNOSIS — Z9181 History of falling: Secondary | ICD-10-CM | POA: Diagnosis not present

## 2023-01-26 DIAGNOSIS — N189 Chronic kidney disease, unspecified: Secondary | ICD-10-CM | POA: Diagnosis present

## 2023-01-26 DIAGNOSIS — Z66 Do not resuscitate: Secondary | ICD-10-CM | POA: Diagnosis present

## 2023-01-26 DIAGNOSIS — I5032 Chronic diastolic (congestive) heart failure: Secondary | ICD-10-CM | POA: Diagnosis present

## 2023-01-26 DIAGNOSIS — D72820 Lymphocytosis (symptomatic): Secondary | ICD-10-CM | POA: Diagnosis present

## 2023-01-26 DIAGNOSIS — Z888 Allergy status to other drugs, medicaments and biological substances status: Secondary | ICD-10-CM

## 2023-01-26 HISTORY — DX: Syncope and collapse: R55

## 2023-01-26 LAB — URINALYSIS, ROUTINE W REFLEX MICROSCOPIC
Bacteria, UA: NONE SEEN
Bilirubin Urine: NEGATIVE
Glucose, UA: NEGATIVE mg/dL
Hgb urine dipstick: NEGATIVE
Ketones, ur: NEGATIVE mg/dL
Leukocytes,Ua: NEGATIVE
Nitrite: NEGATIVE
Protein, ur: 30 mg/dL — AB
Specific Gravity, Urine: 1.019 (ref 1.005–1.030)
pH: 5 (ref 5.0–8.0)

## 2023-01-26 LAB — CBC WITH DIFFERENTIAL/PLATELET
Abs Immature Granulocytes: 0.01 10*3/uL (ref 0.00–0.07)
Basophils Absolute: 0 10*3/uL (ref 0.0–0.1)
Basophils Relative: 1 %
Eosinophils Absolute: 0.1 10*3/uL (ref 0.0–0.5)
Eosinophils Relative: 2 %
HCT: 33.5 % — ABNORMAL LOW (ref 36.0–46.0)
Hemoglobin: 10.5 g/dL — ABNORMAL LOW (ref 12.0–15.0)
Immature Granulocytes: 0 %
Lymphocytes Relative: 30 %
Lymphs Abs: 1.6 10*3/uL (ref 0.7–4.0)
MCH: 29.7 pg (ref 26.0–34.0)
MCHC: 31.3 g/dL (ref 30.0–36.0)
MCV: 94.6 fL (ref 80.0–100.0)
Monocytes Absolute: 0.3 10*3/uL (ref 0.1–1.0)
Monocytes Relative: 6 %
Neutro Abs: 3.2 10*3/uL (ref 1.7–7.7)
Neutrophils Relative %: 61 %
Platelets: 130 10*3/uL — ABNORMAL LOW (ref 150–400)
RBC: 3.54 MIL/uL — ABNORMAL LOW (ref 3.87–5.11)
RDW: 14.1 % (ref 11.5–15.5)
WBC: 5.3 10*3/uL (ref 4.0–10.5)
nRBC: 0 % (ref 0.0–0.2)

## 2023-01-26 LAB — BASIC METABOLIC PANEL
Anion gap: 9 (ref 5–15)
BUN: 35 mg/dL — ABNORMAL HIGH (ref 8–23)
CO2: 26 mmol/L (ref 22–32)
Calcium: 9.5 mg/dL (ref 8.9–10.3)
Chloride: 102 mmol/L (ref 98–111)
Creatinine, Ser: 1.04 mg/dL — ABNORMAL HIGH (ref 0.44–1.00)
GFR, Estimated: 54 mL/min — ABNORMAL LOW (ref >60)
Glucose, Bld: 115 mg/dL — ABNORMAL HIGH (ref 70–99)
Potassium: 4.1 mmol/L (ref 3.5–5.1)
Sodium: 137 mmol/L (ref 135–145)

## 2023-01-26 LAB — CK: Total CK: 115 U/L (ref 38–234)

## 2023-01-26 LAB — TROPONIN I (HIGH SENSITIVITY): Troponin I (High Sensitivity): 10 ng/L (ref <18)

## 2023-01-26 LAB — PROCALCITONIN: Procalcitonin: <0.1 ng/mL

## 2023-01-26 LAB — LACTIC ACID, PLASMA: Lactic Acid, Venous: 1.6 mmol/L (ref 0.5–1.9)

## 2023-01-26 MED ORDER — VITAMIN B-12 1000 MCG PO TABS
1000.0000 ug | ORAL_TABLET | Freq: Every day | ORAL | Status: DC
  Administered 2023-01-27 – 2023-01-28 (×2): 1000 ug via ORAL
  Filled 2023-01-26: qty 1
  Filled 2023-01-26: qty 2

## 2023-01-26 MED ORDER — LOSARTAN POTASSIUM 25 MG PO TABS
12.5000 mg | ORAL_TABLET | Freq: Every day | ORAL | Status: DC
  Administered 2023-01-27 – 2023-01-28 (×2): 12.5 mg via ORAL
  Filled 2023-01-26: qty 0.5
  Filled 2023-01-26: qty 1

## 2023-01-26 MED ORDER — APIXABAN 5 MG PO TABS
5.0000 mg | ORAL_TABLET | Freq: Two times a day (BID) | ORAL | Status: DC
  Administered 2023-01-26 – 2023-01-28 (×4): 5 mg via ORAL
  Filled 2023-01-26 (×5): qty 1

## 2023-01-26 MED ORDER — ASPIRIN 81 MG PO CHEW
324.0000 mg | CHEWABLE_TABLET | Freq: Once | ORAL | Status: DC
  Filled 2023-01-26: qty 4

## 2023-01-26 MED ORDER — SODIUM CHLORIDE 0.9 % IV BOLUS
1000.0000 mL | Freq: Once | INTRAVENOUS | Status: AC
  Administered 2023-01-26: 1000 mL via INTRAVENOUS

## 2023-01-26 MED ORDER — METAXALONE 800 MG PO TABS: 800.0000 mg | ORAL_TABLET | Freq: Every day | ORAL | Status: DC | PRN

## 2023-01-26 MED ORDER — ACETAMINOPHEN 650 MG RE SUPP: 650.0000 mg | Freq: Four times a day (QID) | RECTAL | Status: DC | PRN

## 2023-01-26 MED ORDER — GABAPENTIN 100 MG PO CAPS
200.0000 mg | ORAL_CAPSULE | Freq: Three times a day (TID) | ORAL | Status: DC
  Administered 2023-01-26 – 2023-01-28 (×5): 200 mg via ORAL
  Filled 2023-01-26 (×5): qty 2

## 2023-01-26 MED ORDER — SENNOSIDES-DOCUSATE SODIUM 8.6-50 MG PO TABS: 1.0000 | ORAL_TABLET | Freq: Every evening | ORAL | Status: DC | PRN

## 2023-01-26 MED ORDER — FLUTICASONE PROPIONATE 50 MCG/ACT NA SUSP: 2.0000 | Freq: Every day | NASAL | Status: DC | PRN

## 2023-01-26 MED ORDER — ONDANSETRON HCL 4 MG/2ML IJ SOLN: 4.0000 mg | Freq: Four times a day (QID) | INTRAMUSCULAR | Status: DC | PRN

## 2023-01-26 MED ORDER — MONTELUKAST SODIUM 10 MG PO TABS
10.0000 mg | ORAL_TABLET | Freq: Every day | ORAL | Status: DC
  Administered 2023-01-26 – 2023-01-27 (×2): 10 mg via ORAL
  Filled 2023-01-26 (×2): qty 1

## 2023-01-26 MED ORDER — SODIUM CHLORIDE 0.9 % IV SOLN
500.0000 mg | INTRAVENOUS | Status: DC
  Administered 2023-01-26 – 2023-01-27 (×2): 500 mg via INTRAVENOUS
  Filled 2023-01-26 (×3): qty 5

## 2023-01-26 MED ORDER — SPIRONOLACTONE 12.5 MG HALF TABLET
12.5000 mg | ORAL_TABLET | ORAL | Status: DC
  Administered 2023-01-27: 12.5 mg via ORAL
  Filled 2023-01-26: qty 1

## 2023-01-26 MED ORDER — FOLIC ACID 1 MG PO TABS
1.0000 mg | ORAL_TABLET | Freq: Every day | ORAL | Status: DC
  Administered 2023-01-27 – 2023-01-28 (×2): 1 mg via ORAL
  Filled 2023-01-26 (×3): qty 1

## 2023-01-26 MED ORDER — METOPROLOL SUCCINATE ER 25 MG PO TB24
25.0000 mg | ORAL_TABLET | Freq: Every day | ORAL | Status: DC
  Administered 2023-01-27: 25 mg via ORAL
  Filled 2023-01-26 (×2): qty 1

## 2023-01-26 MED ORDER — ONDANSETRON HCL 4 MG PO TABS: 4.0000 mg | ORAL_TABLET | Freq: Four times a day (QID) | ORAL | Status: DC | PRN

## 2023-01-26 MED ORDER — SODIUM CHLORIDE 0.9 % IV SOLN
2.0000 g | INTRAVENOUS | Status: DC
  Administered 2023-01-26 – 2023-01-27 (×2): 2 g via INTRAVENOUS
  Filled 2023-01-26 (×3): qty 20

## 2023-01-26 MED ORDER — POLYETHYLENE GLYCOL 3350 17 G PO PACK
17.0000 g | PACK | Freq: Every day | ORAL | Status: DC
  Administered 2023-01-26 – 2023-01-27 (×2): 17 g via ORAL
  Filled 2023-01-26 (×2): qty 1

## 2023-01-26 MED ORDER — LAMOTRIGINE 100 MG PO TABS
200.0000 mg | ORAL_TABLET | Freq: Every day | ORAL | Status: DC
  Administered 2023-01-26 – 2023-01-27 (×2): 200 mg via ORAL
  Filled 2023-01-26 (×2): qty 2

## 2023-01-26 MED ORDER — ACETAMINOPHEN 325 MG PO TABS: 650.0000 mg | ORAL_TABLET | Freq: Four times a day (QID) | ORAL | Status: DC | PRN

## 2023-01-26 MED ORDER — ROSUVASTATIN CALCIUM 10 MG PO TABS
40.0000 mg | ORAL_TABLET | Freq: Every day | ORAL | Status: DC
  Administered 2023-01-26 – 2023-01-27 (×2): 40 mg via ORAL
  Filled 2023-01-26: qty 2
  Filled 2023-01-26: qty 4
  Filled 2023-01-26: qty 2

## 2023-01-26 MED ORDER — LAMOTRIGINE 100 MG PO TABS
150.0000 mg | ORAL_TABLET | Freq: Every day | ORAL | Status: DC
  Administered 2023-01-27 – 2023-01-28 (×2): 150 mg via ORAL
  Filled 2023-01-26 (×3): qty 2

## 2023-01-26 MED ORDER — VITAMIN D 25 MCG (1000 UNIT) PO TABS
2000.0000 [IU] | ORAL_TABLET | Freq: Every day | ORAL | Status: DC
  Administered 2023-01-27 – 2023-01-28 (×2): 2000 [IU] via ORAL
  Filled 2023-01-26 (×3): qty 2

## 2023-01-26 NOTE — Assessment & Plan Note (Addendum)
Ruled Out.  2/26 - patient denies feeling pre-syncopal or passing out.  She reported some dizziness worse than her typical episodes (?hx of vertigo), clarifies that she only had weakness. Due to community-acquired pneumonia. Mgmt of CAP as outlined. Will have PT evaluate. No indication for syncope work up at this time.

## 2023-01-26 NOTE — ED Notes (Signed)
[facility], MD notified of rectal temperature 93.55F. Requested orders for bair hugger.

## 2023-01-26 NOTE — Assessment & Plan Note (Signed)
Hbg at baseline. Monitor CBC.

## 2023-01-26 NOTE — Assessment & Plan Note (Signed)
See Sepsis due to pneumonia

## 2023-01-26 NOTE — ED Notes (Signed)
Patient transported to CT 

## 2023-01-26 NOTE — Assessment & Plan Note (Signed)
Continue statin. 

## 2023-01-26 NOTE — Assessment & Plan Note (Signed)
Continue metoprolol and Eliquis

## 2023-01-26 NOTE — Assessment & Plan Note (Signed)
Continue Lamictal, home Brivaracetam

## 2023-01-26 NOTE — ED Provider Notes (Addendum)
[facility] Provider Note    Event Date/Time   First MD Initiated Contact with Patient 01/26/23 1009     (approximate)   History   Dizziness   HPI  [name] [name] is a 82 y.o. female who presents to the emergency department with dizziness.  Patient lives in independent living with [facility].  States that she presents today because she is not feeling well.  States that she has not been feeling well for the past week.  Called out last week to EMS because she was not feeling well and dizzy but states that she refused to come into the emergency department at that time and wanted to lay on the ground.  States that she laid on the ground for at least 28 hours.  States that since that time she has been having difficulty getting up and feels like she cannot walk across the room secondary to weakness.  States that the light hurts her eyes.  Denies any chest pain or shortness of breath.  Denies any abdominal pain, dysuria, urinary urgency or frequency.  Denies any recent falls or trauma.  Denies any room spinning dizziness or double vision at this time.     Physical Exam   Triage Vital Signs: ED Triage Vitals  Enc Vitals Group     BP 01/26/23 1013 125/83     Pulse Rate 01/26/23 1013 76     Resp 01/26/23 1020 17     Temp --      Temp src --      SpO2 01/26/23 1013 97 %     Weight 01/26/23 1011 105 lb 13.1 oz (48 kg)     Height --      Head Circumference --      Peak Flow --      Pain Score 01/26/23 1019 0     Pain Loc --      Pain Edu? --      Excl. in [place]? --     Most recent vital signs: Vitals:   01/26/23 1200 01/26/23 1230  BP: 126/72 124/83  Pulse: 73 73  Resp: 19 10  SpO2: 96% 96%    Physical Exam Constitutional:      Appearance: She is well-developed.  HENT:     Head: Atraumatic.  Eyes:     Conjunctiva/sclera: Conjunctivae normal.  Cardiovascular:     Rate and Rhythm: Regular rhythm.  Pulmonary:     Effort: No respiratory distress.   Abdominal:     General: There is no distension.  Musculoskeletal:        General: Normal range of motion.     Cervical back: Normal range of motion.  Skin:    General: Skin is warm.     Comments: Abrasion to the right flank  Neurological:     Mental Status: She is alert. Mental status is at baseline.  Psychiatric:        Mood and Affect: Mood is anxious. Affect is tearful.        Speech: Speech normal.     Comments: Laying with her eyes closed, tearful.  States she is upset because she is here.     IMPRESSION / MDM / ASSESSMENT AND PLAN / ED COURSE  I reviewed the triage vital signs and the nursing notes.  On chart review patient is followed by cardiology -patient has had a mitral valve replacement with bioprosthetic, history of SVT with ablation, bradycardia and complete heart block with  [employer] pacemaker, prior TIA, underlying right bundle branch block.  I do not see any anticoagulation on patient's med list.  Differential diagnosis including CVA, peripheral vertigo, electrolyte abnormality, dehydration, urinary tract infection, rhabdomyolysis, fracture  EKG  I, Nathaniel Man, the attending physician, personally viewed and interpreted this ECG.   Rate: 75  Rhythm: Paced  Axis: Normal  Intervals: Paced rhythm, wide-complex, QTc 502  ST&T Change: Reading as acute MI.  Disagree with the read.  Paced rhythm.  Negative Sgarbossa's criteria. No change when compared to prior EKG on 11/2022   No tachycardic or bradycardic dysrhythmias while on cardiac telemetry.  RADIOLOGY I independently reviewed imaging, my interpretation of imaging: CT head shows no signs of intracranial hemorrhage or infarction.  CT abdomen and pelvis with no findings of acute intra-abdominal process.  Multiple subacute fractures.  LABS (all labs ordered are listed, but only abnormal results are displayed) Labs interpreted as -   Initial troponin negative.  CK within normal limits.  No significant  anemia.  Creatinine appears to be at her baseline.  No significant electrolyte abnormalities. Labs Reviewed  CBC WITH DIFFERENTIAL/PLATELET - Abnormal; Notable for the following components:      Result Value   RBC 3.54 (*)    Hemoglobin 10.5 (*)    HCT 33.5 (*)    Platelets 130 (*)    All other components within normal limits  BASIC METABOLIC PANEL - Abnormal; Notable for the following components:   Glucose, Bld 115 (*)    BUN 35 (*)    Creatinine, Ser 1.04 (*)    GFR, Estimated 54 (*)    All other components within normal limits  URINALYSIS, ROUTINE W REFLEX MICROSCOPIC - Abnormal; Notable for the following components:   Color, Urine YELLOW (*)    APPearance CLEAR (*)    Protein, ur 30 (*)    All other components within normal limits  LACTIC ACID, PLASMA  CK  TROPONIN I (HIGH SENSITIVITY)    TREATMENT  1 L of IV fluids, aspirin  MDM  Attempted to ambulate the patient in the room however patient unable to ambulate secondary to generalized weakness and states that she just does not feel right and dizzy like she might fall.  No focal neurologic deficits on my exam.  No signs of urinary tract infection.  No signs of an infectious process.  CT scan of the head without signs of intracranial hemorrhage or infarction.  Patient with an abrasion to her flank.    Interrogated pacemaker with no dysrhythmia or events since last time it was interrogated.    Given the patient's ongoing weakness consulted hospitalist for admission.  Patient has a pacemaker in place that is not MRI compatible, concern for possible central CVA, ordered aspirin however patient is on Eliquis and states that she does not want to take it, also makes an upset stomach.  Consulted hospitalist for admission.    PROCEDURES:  Critical Care performed: No  Procedures  Patient's presentation is most consistent with acute presentation with potential threat to life or bodily function.   MEDICATIONS ORDERED IN  ED: Medications  sodium chloride 0.9 % bolus 1,000 mL (1,000 mLs Intravenous New Bag/Given 01/26/23 1044)    FINAL CLINICAL IMPRESSION(S) / ED DIAGNOSES   Final diagnoses:  Dizziness  Fall, initial encounter     Rx / DC Orders   ED Discharge Orders     None        Note:  This document was prepared using  [employer] forces training and education officer and may include unintentional dictation errors.   Nathaniel Man, MD 01/26/23 1229    Nathaniel Man, MD 01/26/23 1233    Nathaniel Man, MD 01/26/23 1243

## 2023-01-26 NOTE — Hospital Course (Addendum)
[name] is a 82 year old female with history of hyperlipidemia, atrial fibrillation on Eliquis, hypertension, monoclonal B-cell lymphocytosis of undetermined significance, CKD 3A, who presents emergency department for chief concerns of generalized weakness, near syncope.  Initial vitals in the ED showed temperature of 93.4, respiration rate of 10, heart rate 75, blood pressure 109/77, SpO2 of 96% room air.  Serum sodium is 137, potassium 4.1, chloride 102, bicarb 26, BUN of 35, serum creatinine 1.04, GFR of 54, nonfasting blood glucose 115, WBC 5.3, hemoglobin 10.5, platelets of 130.  Lactic acid 1.2, CK1 115, high sensitive troponin is 10.  UA was negative for leukocytes and nitrates.  ED treatment: Aspirin 324 mg p.o. one-time dose, sodium chloride 1 L bolus.

## 2023-01-26 NOTE — Assessment & Plan Note (Signed)
Continue metoprolol, Eliquis

## 2023-01-26 NOTE — ED Triage Notes (Signed)
Pt arrives from independent living [facility]) for dizziness. Pt reports dizzy spells started last week that came and went. Pt reports dizziness is worse with certain positions.

## 2023-01-26 NOTE — H&P (Signed)
History and Physical   [name] [name] [id number] DOB: July 30, 1941 DOA: 01/26/2023  PCP: [name], MD  Outpatient Specialists: Dr. Rockey Situ, [facility] cardiology Patient coming from: [facility] independent living via EMS  I have personally briefly reviewed patient's old medical records in [place].  Chief Concern: Near syncope, dizziness  HPI: [name] [name] is a 82 year old female with history of hyperlipidemia, atrial fibrillation on Eliquis, hypertension, monoclonal B-cell lymphocytosis of undetermined significance, CKD 3A, who presents emergency department for chief concerns of generalized weakness, near syncope.  Initial vitals in the ED showed temperature of 93.4, respiration rate of 10, heart rate 75, blood pressure 109/77, SpO2 of 96% room air.  Serum sodium is 137, potassium 4.1, chloride 102, bicarb 26, BUN of 35, serum creatinine 1.04, GFR of 54, nonfasting blood glucose 115, WBC 5.3, hemoglobin 10.5, platelets of 130.  Lactic acid 1.2, CK1 115, high sensitive troponin is 10.  UA was negative for leukocytes and nitrates.  ED treatment: Aspirin 324 mg p.o. one-time dose, sodium chloride 1 L bolus. --------------------------- At bedside, patient was to tell me her name, age, the current calendar year, her current location.  She reports that she started to feel weak 1 week ago, Sunday, 01/19/2023.  This lasted approximately 28 to 30 hours.  In that entire time, she intentionally laid close to the restroom in case she got sick and were needed to use the restroom.  She did not get up for that entire duration.  She endorses 1 episode of vomiting.  She progressively improved over the week however developed weakness on 01/26/2023, prompting her to present to the emergency department for further evaluation.  She also reports that last week she had some ear aches that has now resolved.  She reports the nausea and vomiting has resolved.  She denies chest pain, shortness of  breath, dysuria, hematuria, diarrhea, blood in his stool, syncope, loss of consciousness.  She denies trauma to her person.  She endorses baseline bilateral lower ankle swelling.  Social history: She lives on her own at [facility].  ROS: Constitutional: no weight change, no fever ENT/Mouth: no sore throat, no rhinorrhea Eyes: no eye pain, no vision changes Cardiovascular: no chest pain, no dyspnea,  bilateral ankle edema, no palpitations Respiratory: no cough, no sputum, no wheezing Gastrointestinal: no nausea, no vomiting, no diarrhea, no constipation Genitourinary: no urinary incontinence, no dysuria, no hematuria Musculoskeletal: no arthralgias, no myalgias Skin: no skin lesions, no pruritus, Neuro: + weakness, no loss of consciousness, no syncope Psych: no anxiety, no depression, no decrease appetite Heme/Lymph: no bruising, no bleeding  ED Course: Discussed with emergency medicine provider, patient requiring hospitalization for chief concerns of near syncope, increased risk for falling and unsafe discharge home.  Assessment/Plan  Principal Problem:   Near syncope Active Problems:   Mitral valve prolapse   Pulmonary hypertension (HCC)   Atrial fibrillation, persistent (HCC)   Anemia in chronic kidney disease   Focal seizures (HCC)   PAF (paroxysmal atrial fibrillation) (HCC)   Seizure disorder (HCC)   Hyperlipidemia   Monoclonal B-cell lymphocytosis of undetermined significance   Do not resuscitate status   Anemia of chronic disease   CAP (community acquired pneumonia)   SIRS (systemic inflammatory response syndrome) (HCC)   Assessment and Plan:  * Near syncope - I suspect this is secondary to community-acquired pneumonia - Fall precautions, aspiration precaution - Check stat chest x-ray, B12 serum level - Admit to telemetry cardiac, inpatient  SIRS (systemic  inflammatory response syndrome) ([place]) - Patient has hypothermia, increased respiration rate, source of  pneumonia - Started patient on azithromycin, ceftriaxone - Check procalcitonin on admission and on 01/27/2023, to assess antibiotic effectiveness - Incentive spirometry, flutter valve - Blood cultures x 2 ordered  CAP (community acquired pneumonia) - Continue azithromycin 500 mg IV daily, ceftriaxone 2 g IV daily, 5-day course ordered - Incentive spirometry, flutter valve  Hyperlipidemia - Rosuvastatin 40 mg nightly resumed  PAF (paroxysmal atrial fibrillation) (HCC) - Metoprolol, Eliquis resumed  Focal seizures ([place]) - Pharmacy consult: Lamotrigine 150 mg in the morning and 200 mg in the p.m. ordered  Anemia in chronic kidney disease - At baseline  Atrial fibrillation, persistent ([place]) - Resumed home metoprolol succinate 25 mg daily, apixaban 5 mg p.o. twice daily  Chart reviewed.   DVT prophylaxis: Eliquis 5 mg p.o. twice daily Code Status: DNR  Diet: Heart healthy Family Communication: Updated daughter, Annett Gula with patient's permission Disposition Plan: Pending clinical course Consults called: None at this time Admission status: Telemetry cardiac, inpatient  Past Medical History:  Diagnosis Date   Acute on chronic diastolic (congestive) heart failure ([place])    Allergy    See list   Anemia 2018   Anxiety    Occasionally take Xanax for sleep   Anxiety associated with depression    Prn alprazolam    Anxiety associated with depression    Arthritis    Hands, Back   Atrial fibrillation, persistent ([place])    DCCV 08/22/2015   Bradycardia post-op bradycardia, pacer dependent   MDT PPM 11/06/15, Dr. Lovena Le   Cataract    Left eye   Chronic kidney disease (CKD) stage G3a/A2, moderately decreased glomerular filtration rate (GFR) between 45-59 mL/min/1.73 square meter and albuminuria creatinine ratio between 30-299 mg/g ([place]) 11/13/2021   Diverticulosis    Focal seizures ([place])    Heart murmur    Hematuria, gross 04/09/2018   Hemorrhoid    Hepatic cyst    innumerable    History of asbestos exposure    Hyperlipidemia    IBS (irritable bowel syndrome)    Idiopathic thrombocytopenic purpura (ITP) (HCC)    Migraine    MVP (mitral valve prolapse)    Nodule of right lung    Osteoporosis of forearm    RBBB    Restrictive lung disease    Mild on PFT & likely cardiac in etiology    Right sided sciatica 04/10/2022   S/P Minimally invasive maze operation for atrial fibrillation 10/31/2015   Complete bilateral atrial lesion set using cryothermy and bipolar radiofrequency ablation with clipping of LA appendage via right mini thoracotomy approach   S/P minimally invasive mitral valve replacement with bioprosthetic valve 10/31/2015   33 mm [facility] Mitral bovine bioprosthetic tissue valve placed via right mini thoracotomy approach   Seizures ([place])    left foot paralysis and left hand paralysis Dr. 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   SVT (supraventricular tachycardia)    Thoracic aorta atherosclerosis (HCC)    TIA (transient ischemic attack)    Past Surgical History:  Procedure Laterality Date   BREAST EXCISIONAL BIOPSY Right Late 80s   Negative X2   CARDIAC CATHETERIZATION N/A 10/18/2015   Procedure: Right/Left Heart Cath and Coronary Angiography;  Surgeon: Sherren Mocha, MD;  Location: [place] CV LAB;  Service: Cardiovascular;  Laterality: N/A;   CARDIOVERSION N/A 08/22/2015   Procedure: CARDIOVERSION;  Surgeon:  Thayer Headings, MD;  Location: [facility] ENDOSCOPY;  Service: Cardiovascular;  Laterality: N/A;   COLONOSCOPY  2003   EP IMPLANTABLE DEVICE N/A 11/06/2015   Procedure: Pacemaker Implant;  Surgeon: Evans Lance, MD;  Location: [place] CV LAB;  Service: Cardiovascular;  Laterality: N/A;   EP IMPLANTABLE DEVICE N/A 02/20/2016   Procedure: Lead Extraction;  Surgeon: Evans Lance, MD;  Location: [place] CV LAB;  Service: Cardiovascular;  Laterality: N/A;   EYE SURGERY Right 2013    FEMUR IM NAIL Left 06/18/2021   Procedure: INTRAMEDULLARY (IM) NAIL FEMORAL, OPEN REDUCTION INTERNAL FIXATION FEMUR RIGHT LITTLE FINGER PIP CLOSED REDUCTION;  Surgeon: Altamese Gisela, MD;  Location: [place];  Service: Orthopedics;  Laterality: Left;   FOOT SURGERY     ~2007 right foot bunion   MANDIBLE FRACTURE SURGERY  03/26/2013   MINIMALLY INVASIVE MAZE PROCEDURE N/A 10/31/2015   Procedure: MINIMALLY INVASIVE MAZE PROCEDURE;  Surgeon: Rexene Alberts, MD;  Location: [place];  Service: Open Heart Surgery;  Laterality: N/A;   MITRAL VALVE REPLACEMENT Right 10/31/2015   Procedure: MINIMALLY INVASIVE MITRAL VALVE (MV) REPLACEMENT;  Surgeon: Rexene Alberts, MD;  Location: [place];  Service: Open Heart Surgery;  Laterality: Right;   PACEMAKER LEAD REMOVAL  02/20/2016   TEE WITH CARDIOVERSION     TEE WITHOUT CARDIOVERSION N/A 08/22/2015   Procedure: TRANSESOPHAGEAL ECHOCARDIOGRAM (TEE);  Surgeon: Thayer Headings, MD;  Location: [place];  Service: Cardiovascular;  Laterality: N/A;   TEE WITHOUT CARDIOVERSION N/A 10/31/2015   Procedure: TRANSESOPHAGEAL ECHOCARDIOGRAM (TEE);  Surgeon: Rexene Alberts, MD;  Location: [place];  Service: Open Heart Surgery;  Laterality: N/A;   TUBAL LIGATION     VARICOSE VEIN SURGERY Right    Social History:  reports that she has never smoked. She has never used smokeless tobacco. She reports that she does not drink alcohol and does not use drugs.  Allergies  Allergen Reactions   Codeine Nausea And Vomiting and Other (See Comments)    migraine   Hydrocodone Nausea And Vomiting and Other (See Comments)    MIGRAINE   Hydromorphone Nausea And Vomiting and Other (See Comments)    migraine   Molds & Smuts Anxiety, Other (See Comments) and Shortness Of Breath   Oxycodone Nausea And Vomiting and Other (See Comments)    Severe migraine   Meloxicam Other (See Comments)    Severe reflux   Codeine Other (See Comments)    Migraine   Dextromethorphan Other (See Comments)     seizures   Diphen [Diphenhydramine Hcl] Other (See Comments)    seizure   Diphenhydramine Other (See Comments)    seizure   Diphenhydramine Hcl     Other reaction(s): Other (See Comments)   Diphenylpyraline Other (See Comments)   Doxycycline Itching, Swelling and Other (See Comments)    Facial   Doxycycline Itching and Swelling   Hydrocodone Other (See Comments)    Migraine   Hydromorphone Other (See Comments)    Migraine   Meloxicam Other (See Comments)   Mobic [Meloxicam] Other (See Comments)    reflux   Nsaids    Nsaids Other (See Comments)    Pt on blood thinner   Oxycodone Other (See Comments)    Migraine   Propofol Other (See Comments)   Propofol Other (See Comments)    Very sensitive; patient stated she was told she was told she had apnea   Family History  Problem Relation Age of Onset  Hypertension Mother    Arrhythmia Mother    Heart failure Mother    Arrhythmia Brother    Stroke Brother 30       cerebral hemorrhage, nonsmoker, no HTN   Prostate cancer Brother    Stroke Father        from an aneurysm   Stroke Maternal Aunt 83       cerebral hemorrhage   Liver cancer Maternal Grandmother    Atrial fibrillation Son    Celiac disease Son    Heart attack Neg Hx    Breast cancer Neg Hx    Family history: Family history reviewed and not pertinent.  Prior to Admission medications   Medication Sig Start Date End Date Taking? Authorizing Provider  amoxicillin (AMOXIL) 500 MG capsule TAKE 4 CAPSULES ONE HOUR PRIOR TO [place] Jude 03/05/22   [name], MD  BRIVIACT 50 MG TABS Take 1 tablet by mouth 2 (two) times daily. 03/20/22   [provider]  butalbital-acetaminophen-caffeine (FIORICET) 50-325-40 MG tablet TAKE 1 TABLET BY MOUTH ONCE DAILY AS NEEDED FOR BACK PAIN OR MIGRAINE MAX OF 3 TABS PER DAY 08/28/22   [name], MD  calcium carbonate (CALCIUM 600) 600 MG TABS tablet  07/09/22   [provider]  Cholecalciferol (VITAMIN D)  2000 units CAPS Take 2,000 Units by mouth daily.    [provider]  ELIQUIS 5 MG TABS tablet TAKE ONE TABLET TWICE DAILY 12/03/22   Minna Merritts, MD  fluticasone (FLONASE) 50 MCG/ACT nasal spray USE 2 SPRAYS IN BOTH NOSTRILS DAILY 10/07/22   [name], MD  folic acid (FOLVITE) 1 MG tablet Take 1 mg by mouth daily.    [provider]  furosemide (LASIX) 40 MG tablet TAKE ONE (1) TABLET BY MOUTH TWICE PER WEEK; TAKE AN ADDITIONAL HALF (1/2) TO ONE (1) TABLET AS NEEDED FOR FLUID RETENTION 07/09/22   Minna Merritts, MD  gabapentin (NEURONTIN) 100 MG capsule Take 200 mg by mouth 3 (three) times daily. 05/06/22 05/06/23  [provider]  lamoTRIgine (LAMICTAL) 200 MG tablet Take 150 mg in the am & 200 mg in the pm. 10/18/21   [provider]  losartan (COZAAR) 25 MG tablet TAKE 1/2 TABLET BY MOUTH ONCE DAILY 01/07/23   Minna Merritts, MD  metaxalone (SKELAXIN) 800 MG tablet TAKE ONE TABLET BY MOUTH 3 TIMES DAILY 08/15/22   [name], MD  metoprolol succinate (TOPROL-XL) 25 MG 24 hr tablet TAKE ONE TABLET (25 MG) BY MOUTH EVERY DAY 11/12/22   Minna Merritts, MD  montelukast (SINGULAIR) 10 MG tablet TAKE ONE TABLET BY MOUTH AT BEDTIME 01/09/23   [name], MD  polyethylene glycol powder (GLYCOLAX/MIRALAX) 17 GM/SCOOP powder Take 1 Dose by mouth daily. 05/24/19   [provider]  rosuvastatin (CRESTOR) 40 MG tablet TAKE ONE TABLET EVERY DAY 08/08/22   [name], MD  spironolactone (ALDACTONE) 25 MG tablet Take 0.5 tablets (12.5 mg total) by mouth every other day. 11/12/22   Minna Merritts, MD  VENTOLIN HFA 108 (90 Base) MCG/ACT inhaler INHALE 2 PUFFS INTO THE LUNGS EVERY 6 HOURS AS NEEDED FOR WHEEZING OR SHORTNESS OF BREATH 10/07/22   [name], MD  vitamin B-12 (CYANOCOBALAMIN) 1000 MCG tablet Take 1,000 mcg by mouth daily. 10/02/20   [provider]   Physical Exam: Vitals:   01/26/23 1330 01/26/23 1400 01/26/23 1430  01/26/23 1444  BP: 112/78 106/67 116/82   Pulse:  73 73 73   Resp: '19 16 20   '$ Temp:    (!) 93.4 F (34.1 C)  TempSrc:    Rectal  SpO2: 100% 100% 98%   Weight:       Constitutional: appears age appropriate, NAD, calm, comfortable Eyes: PERRL, lids and conjunctivae normal ENMT: Mucous membranes are moist. Posterior pharynx clear of any exudate or lesions. Age-appropriate dentition. Hearing appropriate. Bilateral TM positive for cone of light and negative for edema, erythema Neck: normal, supple, no masses, no thyromegaly Respiratory: clear to auscultation bilaterally, no wheezing, no crackles. Normal respiratory effort. No accessory muscle use.  Cardiovascular: Regular rate and rhythm, no murmurs / rubs / gallops. No extremity edema. 2+ pedal pulses. No carotid bruits.  Abdomen: no tenderness, no masses palpated, no hepatosplenomegaly. Bowel sounds positive.  Musculoskeletal: no clubbing / cyanosis. No joint deformity upper and lower extremities. Good ROM, no contractures, no atrophy. Normal muscle tone.  Skin: no rashes, lesions, ulcers. No induration Neurologic: Sensation intact. Strength 5/5 in all 4.  Psychiatric: Normal judgment and insight. Alert and oriented x 3. Normal mood.   EKG: independently reviewed, showing sinus rhythm with a rate of 75, QTc 502  Chest x-ray on Admission: I personally reviewed and I agree with radiologist reading as below.  CT ABDOMEN PELVIS WO CONTRAST  Addendum Date: 01/26/2023   ADDENDUM REPORT: 01/26/2023 14:50 ADDENDUM: Upon re-review of the images, the following additional findings are identified. Acute nondisplaced fracture of the posterior right twelfth rib (series 2, image 27). Acute mildly displaced fracture of the tip of the right L1 transverse process (series 2, image 32). No additional changes. Electronically Signed   By: Davina Poke D.O.   On: 01/26/2023 14:50   Result Date: 01/26/2023 CLINICAL DATA:  Abdominal trauma, blunt EXAM: CT  ABDOMEN AND PELVIS WITHOUT CONTRAST TECHNIQUE: Multidetector CT imaging of the abdomen and pelvis was performed following the standard protocol without IV contrast. RADIATION DOSE REDUCTION: This exam was performed according to the departmental dose-optimization program which includes automated exposure control, adjustment of the mA and/or kV according to patient size and/or use of iterative reconstruction technique. COMPARISON:  CT 05/10/2016, 07/10/2022 FINDINGS: Lower chest: Cardiomegaly. Hepatobiliary: Similar size and distribution of innumerable rounded low-density lesions throughout the liver, most compatible with hepatic cysts. Unremarkable gallbladder. No hyperdense gallstone. No biliary dilatation. Pancreas: Unremarkable. No pancreatic ductal dilatation or surrounding inflammatory changes. Spleen: Normal in size without focal abnormality. Adrenals/Urinary Tract: Adrenal glands are unremarkable. Kidneys are normal, without renal calculi, solid lesion, or hydronephrosis. Bladder is unremarkable. Stomach/Bowel: Stomach is within normal limits. Appendix not definitively seen. A loop of small bowel protrudes into patient's small right femoral hernia. Scattered colonic diverticulosis. No evidence of bowel wall thickening, distention, or inflammatory changes. Moderate volume stool throughout the colon. Vascular/Lymphatic: Aortic atherosclerosis. No enlarged abdominal or pelvic lymph nodes. Reproductive: Uterus and bilateral adnexa are unremarkable. Other: No free air or free fluid. Musculoskeletal: Chronic appearing fracture of the right superior pubic ramus at the pubic root is new from prior. No significant displacement. Chronic fractures of the right inferior pubic ramus and parasymphyseal aspect of the right pubic bone with worsening alignment at both fracture sites. The inferior pubic ramus fracture on the right is ununited. SI joint and pubic symphysis intact without diastasis. Subacute appearing bilateral  sacral alar fractures with patchy sclerosis. Lumbar dextrocurvature with multilevel spondylosis. IMPRESSION: 1. No acute intra-abdominopelvic findings. 2. Subacute-appearing bilateral sacral alar fractures. 3. Subacute nondisplaced right superior pubic ramus fracture. 4.  Chronic fractures of the right inferior pubic ramus and parasymphyseal aspect of the right pubic bone with worsening alignment at both fracture sites. The inferior pubic ramus fracture is ununited. 5. Small right femoral hernia containing a loop of small bowel. No evidence of bowel obstruction. 6. Colonic diverticulosis without evidence of acute diverticulitis. 7. Cardiomegaly. 8. Aortic atherosclerosis (ICD10-I70.0). Electronically Signed: By: Davina Poke D.O. On: 01/26/2023 11:12   DG Chest Port 1 View  Result Date: 01/26/2023 CLINICAL DATA:  Shortness of breath EXAM: PORTABLE CHEST 1 VIEW COMPARISON:  06/17/2021 FINDINGS: Left-sided implanted cardiac device remains in place. Stable cardiomegaly status post cardiac valve replacement and left atrial appendage clipping. Aortic atherosclerosis. Patchy airspace opacity within the right upper lobe. No pleural effusion. No pneumothorax. IMPRESSION: Patchy airspace opacity within the right upper lobe, suspicious for pneumonia. Electronically Signed   By: Davina Poke D.O.   On: 01/26/2023 14:47   CT Head Wo Contrast  Result Date: 01/26/2023 CLINICAL DATA:  Headache, new onset, dizziness. EXAM: CT HEAD WITHOUT CONTRAST TECHNIQUE: Contiguous axial images were obtained from the base of the skull through the vertex without intravenous contrast. RADIATION DOSE REDUCTION: This exam was performed according to the departmental dose-optimization program which includes automated exposure control, adjustment of the mA and/or kV according to patient size and/or use of iterative reconstruction technique. COMPARISON:  Head CT dated 07/24/2022 FINDINGS: Brain: Ventricles are stable in size and  configuration. There is no mass, hemorrhage, edema or other evidence of acute parenchymal abnormality. No extra-axial hemorrhage. Vascular: Chronic calcified atherosclerotic changes of the large vessels at the skull base. No unexpected hyperdense vessel. Skull: Normal. Negative for fracture or focal lesion. Sinuses/Orbits: No acute finding. Other: None. IMPRESSION: No acute findings. No intracranial mass, hemorrhage or edema. Electronically Signed   By: Franki Cabot M.D.   On: 01/26/2023 11:07    Labs on Admission: I have personally reviewed following labs  CBC: Recent Labs  Lab 01/26/23 1019  WBC 5.3  NEUTROABS 3.2  HGB 10.5*  HCT 33.5*  MCV 94.6  PLT [id number]*   Basic Metabolic Panel: Recent Labs  Lab 01/26/23 1019  NA 137  K 4.1  CL 102  CO2 26  GLUCOSE 115*  BUN 35*  CREATININE 1.04*  CALCIUM 9.5   GFR: Estimated Creatinine Clearance: 31.6 mL/min (A) (by C-G formula based on SCr of 1.04 mg/dL (H)).  Cardiac Enzymes: Recent Labs  Lab 01/26/23 1019  CKTOTAL 115   Urine analysis:    Component Value Date/Time   COLORURINE YELLOW (A) 01/26/2023 1217   APPEARANCEUR CLEAR (A) 01/26/2023 1217   LABSPEC 1.019 01/26/2023 1217   PHURINE 5.0 01/26/2023 1217   GLUCOSEU NEGATIVE 01/26/2023 1217   GLUCOSEU NEGATIVE 04/07/2018 1647   HGBUR NEGATIVE 01/26/2023 1217   BILIRUBINUR NEGATIVE 01/26/2023 1217   KETONESUR NEGATIVE 01/26/2023 1217   PROTEINUR 30 (A) 01/26/2023 1217   UROBILINOGEN 0.2 04/07/2018 1647   NITRITE NEGATIVE 01/26/2023 1217   LEUKOCYTESUR NEGATIVE 01/26/2023 1217   CRITICAL CARE Performed by: Dr. Tobie Poet  Total critical care time: 35 minutes  Critical care time was exclusive of separately billable procedures and treating other patients.  Critical care was necessary to treat or prevent imminent or life-threatening deterioration.  Critical care was time spent personally by me on the following activities: development of treatment plan with patient and/or  surrogate as well as nursing, discussions with consultants, evaluation of patient's response to treatment, examination of patient, obtaining history from patient or surrogate, ordering and  performing treatments and interventions, ordering and review of laboratory studies, ordering and review of radiographic studies, pulse oximetry and re-evaluation of patient's condition.  This document was prepared using Dragon Voice Recognition software and may include unintentional dictation errors.  Dr. Tobie Poet [facility]  If 7PM-7AM, please contact overnight-coverage provider If 7AM-7PM, please contact day coverage provider [email or web address]  01/26/2023, 3:50 PM

## 2023-01-26 NOTE — Assessment & Plan Note (Signed)
Sepsis POA with hypothermia, tachypnea, in setting of pneumonia --Treated with empiric IV Rocephin, Zithromax --Discharge on PO Zithromax and Omnicef to complete treatement course --Supportive care  - Incentive spirometry, flutter valve - Blood cultures x 2 neg to date --Supplement O2 if sats < 90% on room air

## 2023-01-26 NOTE — Progress Notes (Incomplete)
       CROSS COVER NOTE  NAME: [name] [name] MRN: [id number] DOB : Oct 20, 1941 ATTENDING PHYSICIAN: Cox, Briant Cedar, DO    Date of Service   01/26/2023   HPI/Events of Note     Interventions   Assessment/Plan:  Patient declines to send bottle to [facility] for appropriate labeling. NO active inpatient order to take med entered. X X    *** professional thanks      To reach the provider On-Call:   7AM- 7PM see care teams to locate the attending and reach out to them via [email or web address] Password: [email or web address] 7PM-7AM contact night-coverage If you still have difficulty reaching the appropriate provider, please page the [facility] (Director on Call) for [facility] on amion for assistance  This document was prepared using Systems analyst and may include unintentional dictation errors.  Neomia Glass DNP, MBA, FNP-BC, PMHNP-BC Nurse Practitioner [facility] Pager [phone]

## 2023-01-27 DIAGNOSIS — R55 Syncope and collapse: Secondary | ICD-10-CM | POA: Diagnosis not present

## 2023-01-27 LAB — CBC
HCT: 29 % — ABNORMAL LOW (ref 36.0–46.0)
Hemoglobin: 9.2 g/dL — ABNORMAL LOW (ref 12.0–15.0)
MCH: 29.7 pg (ref 26.0–34.0)
MCHC: 31.7 g/dL (ref 30.0–36.0)
MCV: 93.5 fL (ref 80.0–100.0)
Platelets: 110 10*3/uL — ABNORMAL LOW (ref 150–400)
RBC: 3.1 MIL/uL — ABNORMAL LOW (ref 3.87–5.11)
RDW: 14.3 % (ref 11.5–15.5)
WBC: 5.7 10*3/uL (ref 4.0–10.5)
nRBC: 0 % (ref 0.0–0.2)

## 2023-01-27 LAB — BASIC METABOLIC PANEL
Anion gap: 10 (ref 5–15)
BUN: 26 mg/dL — ABNORMAL HIGH (ref 8–23)
CO2: 23 mmol/L (ref 22–32)
Calcium: 8.3 mg/dL — ABNORMAL LOW (ref 8.9–10.3)
Chloride: 106 mmol/L (ref 98–111)
Creatinine, Ser: 0.86 mg/dL (ref 0.44–1.00)
GFR, Estimated: >60 mL/min (ref >60)
Glucose, Bld: 87 mg/dL (ref 70–99)
Potassium: 3.6 mmol/L (ref 3.5–5.1)
Sodium: 139 mmol/L (ref 135–145)

## 2023-01-27 LAB — VITAMIN B12: Vitamin B-12: 2582 pg/mL — ABNORMAL HIGH (ref 180–914)

## 2023-01-27 MED ORDER — BRIVARACETAM 50 MG PO TABS
50.0000 mg | ORAL_TABLET | Freq: Two times a day (BID) | ORAL | Status: DC
  Administered 2023-01-27 (×2): 50 mg via ORAL
  Filled 2023-01-27 (×3): qty 1

## 2023-01-27 MED ORDER — BRIVARACETAM 25 MG PO TABS: 50.0000 mg | ORAL_TABLET | Freq: Two times a day (BID) | ORAL | Status: DC

## 2023-01-27 NOTE — Evaluation (Signed)
Physical Therapy Evaluation Patient Details Name: TRENIDY DANNA MRN: [id number] DOB: 1941-08-02 Today's Date: 01/27/2023  History of Present Illness  Pt is an 82 year old female with history of hyperlipidemia, atrial fibrillation on Eliquis, hypertension, monoclonal B-cell lymphocytosis of undetermined significance, CKD 3A, who presents emergency department for chief concerns of generalized weakness, near syncope.  MD assessment includes: Near syncope, SIRS, CAP, focal seizures, monoclonal B-cell lymphocytosis of undetermined significance, and anemia in chronic kidney disease.   Clinical Impression  Pt was pleasant and motivated to participate during the session and put forth good effort throughout. Pt required some extra time and effort with functional tasks but no physical assistance needed during the session.  Pt was generally steady with standing and ambulating with no overt LOB or LE buckling.  Pt reported no adverse symptoms during the session with SpO2, BP, and HR WNL during the session.   Pt reported living at [facility] retirement community with access to staff assistance as needed.  Pt reported that she was scheduled to begin working with PT at the [facility] center just prior to admission.  Pt will benefit from initiating OPPT at [facility] as scheduled upon discharge to safely address deficits listed in patient problem list for decreased caregiver assistance and eventual return to PLOF.         Recommendations for follow up therapy are one component of a multi-disciplinary discharge planning process, led by the attending physician.  Recommendations may be updated based on patient status, additional functional criteria and insurance authorization.  Follow Up Recommendations Outpatient PT (Pt was set up for PT at [facility] just prior to admission)      Assistance Recommended at Discharge Intermittent Supervision/Assistance  Patient can return home with the  following  A little help with walking and/or transfers;A little help with bathing/dressing/bathroom;Assist for transportation;Assistance with cooking/housework    Equipment Recommendations None recommended by PT  Recommendations for Other Services       Functional Status Assessment Patient has had a recent decline in their functional status and demonstrates the ability to make significant improvements in function in a reasonable and predictable amount of time.     Precautions / Restrictions Precautions Precautions: Fall Restrictions Weight Bearing Restrictions: No      Mobility  Bed Mobility Overal bed mobility: Modified Independent             General bed mobility comments: Min extra time and effort only    Transfers Overall transfer level: Needs assistance Equipment used: Rolling walker (2 wheels) Transfers: Sit to/from Stand Sit to Stand: Supervision           General transfer comment: Good eccentric and concentric control and stability    Ambulation/Gait Ambulation/Gait assistance: Supervision Gait Distance (Feet): 40 Feet Assistive device: Rolling walker (2 wheels) Gait Pattern/deviations: Step-through pattern, Decreased step length - right, Decreased step length - left Gait velocity: decreased     General Gait Details: Slow cadence with short B step length but steady without LOB  Stairs            Wheelchair Mobility    Modified Rankin (Stroke Patients Only)       Balance Overall balance assessment: Needs assistance   Sitting balance-Leahy Scale: Normal     Standing balance support: Bilateral upper extremity supported, During functional activity Standing balance-Leahy Scale: Good  Pertinent Vitals/Pain Pain Assessment Pain Assessment: No/denies pain    Home Living Family/patient expects to be discharged to:: Private residence Living Arrangements: Alone Available Help at Discharge:  Personal care attendant;Available 24 hours/day Type of Home: Independent living facility Home Access: Level entry       Home Layout: One level Home Equipment: Grab bars - toilet;Shower seat      Prior Function Prior Level of Function : Independent/Modified Independent;History of Falls (last six months)             Mobility Comments: Mod ind amb limited community distances with a RW, 2-3 falls in the last 6 months ADLs Comments: Ind with ADLs     Hand Dominance        Extremity/Trunk Assessment   Upper Extremity Assessment Upper Extremity Assessment: Generalized weakness    Lower Extremity Assessment Lower Extremity Assessment: Generalized weakness       Communication   Communication: No difficulties  Cognition Arousal/Alertness: Awake/alert Behavior During Therapy: WFL for tasks assessed/performed Overall Cognitive Status: Within Functional Limits for tasks assessed                                          General Comments      Exercises     Assessment/Plan    PT Assessment Patient needs continued PT services  PT Problem List Decreased strength;Decreased activity tolerance;Decreased balance;Decreased mobility;Decreased knowledge of use of DME       PT Treatment Interventions DME instruction;Gait training;Functional mobility training;Therapeutic activities;Therapeutic exercise;Balance training;Stair training;Patient/family education    PT Goals (Current goals can be found in the Care Plan section)  Acute Rehab PT Goals Patient Stated Goal: To be able to go up and down stairs and curbs PT Goal Formulation: With patient Time For Goal Achievement: 02/09/23 Potential to Achieve Goals: Good    Frequency Min 2X/week     Co-evaluation               AM-PAC PT "6 Clicks" Mobility  Outcome Measure Help needed turning from your back to your side while in a flat bed without using bedrails?: A Little Help needed moving from lying on  your back to sitting on the side of a flat bed without using bedrails?: A Little Help needed moving to and from a bed to a chair (including a wheelchair)?: A Little Help needed standing up from a chair using your arms (e.g., wheelchair or bedside chair)?: A Little Help needed to walk in hospital room?: A Little Help needed climbing 3-5 steps with a railing? : A Little 6 Click Score: 18    End of Session Equipment Utilized During Treatment: Gait belt Activity Tolerance: Patient tolerated treatment well Patient left: in bed;with call bell/phone within reach;with bed alarm set;with family/visitor present Nurse Communication: Mobility status PT Visit Diagnosis: Difficulty in walking, not elsewhere classified (R26.2);Muscle weakness (generalized) (M62.81);History of falling (Z91.81)    Time: [id number] PT Time Calculation (min) (ACUTE ONLY): 31 min   Charges:   PT Evaluation $PT Eval Moderate Complexity: 1 Mod     D. Scott Marget Outten PT, DPT 01/27/23, 3:53 PM

## 2023-01-27 NOTE — Assessment & Plan Note (Signed)
Monitor respiratory status

## 2023-01-27 NOTE — Evaluation (Signed)
Occupational Therapy Evaluation Patient Details Name: [name] [name] MRN: [id number] DOB: October 19, 1941 Today's Date: 01/27/2023   History of Present Illness Pt is an 82 year old female with history of hyperlipidemia, atrial fibrillation on Eliquis, hypertension, monoclonal B-cell lymphocytosis of undetermined significance, CKD 3A, who presents emergency department for chief concerns of generalized weakness, near syncope.  MD assessment includes: Near syncope, SIRS, CAP, focal seizures, monoclonal B-cell lymphocytosis of undetermined significance, and anemia in chronic kidney disease.   Clinical Impression   Patient seen for OT evaluation, son present. Pt is from [facility] PTA pt was independent for ADLs/IADLs and Mod I for functional mobility using a RW. Pt currently functioning at Mod I for bed mobility, supervision for simulated toilet transfer, and set up-supervision for self-care tasks. Pt is close to baseline level of function with ADLs, however, OT will continue to follow pt while in hospital to prevent further decline and work on implementing energy conservation techniques during self-care tasks. No follow up OT recommended at D/C.      Recommendations for follow up therapy are one component of a multi-disciplinary discharge planning process, led by the attending physician.  Recommendations may be updated based on patient status, additional functional criteria and insurance authorization.   Follow Up Recommendations  No OT follow up     Assistance Recommended at Discharge Set up Supervision/Assistance  Patient can return home with the following A little help with walking and/or transfers;A little help with bathing/dressing/bathroom;Assistance with cooking/housework;Assist for transportation;Help with stairs or ramp for entrance    Functional Status Assessment  Patient has had a recent decline in their functional status and demonstrates the ability to make significant improvements  in function in a reasonable and predictable amount of time.  Equipment Recommendations  None recommended by OT    Recommendations for Other Services       Precautions / Restrictions Precautions Precautions: Fall Restrictions Weight Bearing Restrictions: No      Mobility Bed Mobility Overal bed mobility: Modified Independent             General bed mobility comments: for supine<>sit    Transfers Overall transfer level: Needs assistance Equipment used: Rolling walker (2 wheels) Transfers: Sit to/from Stand Sit to Stand: Supervision                  Balance Overall balance assessment: Needs assistance   Sitting balance-Leahy Scale: Normal     Standing balance support: Bilateral upper extremity supported, During functional activity Standing balance-Leahy Scale: Good                             ADL either performed or assessed with clinical judgement   ADL Overall ADL's : Needs assistance/impaired     Grooming: Standing;Set up;Supervision/safety           Upper Body Dressing : Set up;Sitting   Lower Body Dressing: Supervision/safety;Sitting/lateral leans;Sit to/from stand;Set up   Toilet Transfer: Supervision/safety;Rolling walker (2 wheels) Toilet Transfer Details (indicate cue type and reason): simulated         Functional mobility during ADLs: Supervision/safety;Rolling walker (2 wheels) (~10 ft at room level)       Vision Baseline Vision/History: 1 Wears glasses Patient Visual Report: No change from baseline       Perception     Praxis      Pertinent Vitals/Pain Pain Assessment Pain Assessment: No/denies pain     Hand Dominance Right   Extremity/Trunk  Assessment Upper Extremity Assessment Upper Extremity Assessment: Generalized weakness   Lower Extremity Assessment Lower Extremity Assessment: Generalized weakness       Communication Communication Communication: No difficulties   Cognition  Arousal/Alertness: Awake/alert Behavior During Therapy: WFL for tasks assessed/performed Overall Cognitive Status: Within Functional Limits for tasks assessed                                       General Comments       Exercises Other Exercises Other Exercises: OT provided education re: role of OT, OT POC, post acute recs, sitting up for all meals, EOB/OOB mobility with assistance, home/fall safety.     Shoulder Instructions      Home Living Family/patient expects to be discharged to:: Private residence Living Arrangements: Alone Available Help at Discharge: Family;Available PRN/intermittently (Pt reports she has access to assistance from staff at [facility] if needed; son nearby) Type of Home: Independent living facility Home Access: Level entry     Home Layout: One level     Bathroom Shower/Tub: Occupational psychologist: Handicapped height     Home Equipment: Grab bars - toilet;Shower seat          Prior Functioning/Environment Prior Level of Function : Independent/Modified Independent;History of Falls (last six months);Driving             Mobility Comments: Mod ind amb limited community distances with a RW, 2-3 falls in the last 6 months ADLs Comments: Ind with ADLs/IADLs, driving, has hired Dance movement psychotherapist 1x/month        OT Problem List: Decreased strength;Decreased activity tolerance;Impaired balance (sitting and/or standing)      OT Treatment/Interventions: Self-care/ADL training;Therapeutic exercise;Neuromuscular education;Energy conservation;DME and/or AE instruction;Manual therapy;Modalities;Balance training;Patient/family education;Visual/perceptual remediation/compensation;Cognitive remediation/compensation;Therapeutic activities;Splinting    OT Goals(Current goals can be found in the care plan section) Acute Rehab OT Goals Patient Stated Goal: return to ILF OT Goal Formulation: With patient/family Time For Goal Achievement:  02/10/23 Potential to Achieve Goals: Good   OT Frequency: Min 2X/week    Co-evaluation              AM-PAC OT "6 Clicks" Daily Activity     Outcome Measure Help from another person eating meals?: None Help from another person taking care of personal grooming?: A Little Help from another person toileting, which includes using toliet, bedpan, or urinal?: A Little Help from another person bathing (including washing, rinsing, drying)?: A Little Help from another person to put on and taking off regular upper body clothing?: None Help from another person to put on and taking off regular lower body clothing?: A Little 6 Click Score: 20   End of Session Equipment Utilized During Treatment: Gait belt;Rolling walker (2 wheels) Nurse Communication: Mobility status  Activity Tolerance: Patient tolerated treatment well Patient left: in bed;with call bell/phone within reach;with bed alarm set;with family/visitor present  OT Visit Diagnosis: Muscle weakness (generalized) (M62.81);History of falling (Z91.81);Other abnormalities of gait and mobility (R26.89)                Time: [id number] OT Time Calculation (min): 13 min Charges:  OT General Charges $OT Visit: 1 Visit OT Evaluation $OT Eval Low Complexity: 1 Low  [facility] MS, OTR/L ascom [phone]  01/27/23, 5:09 PM

## 2023-01-27 NOTE — Assessment & Plan Note (Signed)
Noted  

## 2023-01-27 NOTE — Progress Notes (Signed)
Patient has personal bottle of briviacet anti-seizure medication. Verified with pharmacy that it is not stocked. Patient has refused to have medication verified by pharmacy due to concerns of her medication being misplaced.

## 2023-01-27 NOTE — Assessment & Plan Note (Signed)
No acute issues.

## 2023-01-27 NOTE — Assessment & Plan Note (Signed)
No acute issues. Monitor.

## 2023-01-27 NOTE — Assessment & Plan Note (Signed)
Resumed on Lamictal, Brivaracetam

## 2023-01-27 NOTE — Progress Notes (Signed)
Progress Note   Patient: [name] [name] [id number] DOB: 1941/04/18 DOA: 01/26/2023     1 DOS: the patient was seen and examined on 01/27/2023   Brief hospital course: [name] is a 82 year old female with history of hyperlipidemia, atrial fibrillation on Eliquis, hypertension, monoclonal B-cell lymphocytosis of undetermined significance, CKD 3A, who presented to the ED on 01/26/2023 for evaluation of worsening generalized weakness over prior few days.  She reported onset of weakness 1 week prior on 2/18.  Reports testing negative for Covid at [facility]. Denies any significant respiratory symptoms, or other symptoms besides weakness and dizziness worse than she typically has.  Pt was hypothermic in the ED with temp 93.4, RR 10, HR 75, BP 109/77, SpO2 of 96% room air.  Labs were notable for mild Cr elevation 1.04, mild anemia Hbg 10.5 and mild thrombocytopenia 130k.   Procal was < 0.10 and lactic acid 1.6 normal.  CK was normal 115.  Chest xray showed right upper lobe pneumonia.  She was started on empiric IV antibiotic coverage for community-acquired pneumonia.    Assessment and Plan: * Near syncope Ruled Out.  2/26 - patient denies feeling pre-syncopal or passing out.  She reported some dizziness worse than her typical episodes (?hx of vertigo), clarifies that she only had weakness. Due to community-acquired pneumonia. Mgmt of CAP as outlined. Will have PT evaluate. No indication for syncope work up at this time.  Sepsis due to pneumonia ([place]) Sepsis POA with hypothermia, tachypnea, in setting of pneumonia --Continue IV Rocephin, Zithromax --Supportive care per orders - Incentive spirometry, flutter valve - Blood cultures x 2 ordered --Supplement O2 if sats < 90% on room air  CAP (community acquired pneumonia) See Sepsis due to pneumonia  Do not resuscitate status Noted  Monoclonal B-cell lymphocytosis of undetermined significance No acute issues.  Monitor.  Hyperlipidemia Continue statin  Seizure disorder (HCC) Resumed on Lamictal, Brivaracetam  PAF (paroxysmal atrial fibrillation) (HCC) Continue metoprolol, Eliquis   Focal seizures (HCC) Continue Lamictal, home Brivaracetam  Anemia in chronic kidney disease Hbg at baseline. Monitor CBC.  Atrial fibrillation, persistent (HCC) Continue metoprolol and Eliquis   Pulmonary hypertension (HCC) Monitor respiratory status  Mitral valve prolapse No acute issues        Subjective: Pt seen in ED holding for a bed this AM.  She is asking to go home today, reports feeling much better.  Denies passing out or feeling close to passing out at home. Just felt very weak and dizziness worse than her typical.  Denies respiratory symptoms, fever/chills.  Was about to start PT at [facility] now that she's recovered from her L shoulder surgery.  Discussed prior falls and fractures, but has been mobilizing with walker okay recently.   Physical Exam: Vitals:   01/27/23 1100 01/27/23 1115 01/27/23 1200 01/27/23 1207  BP: 125/81  (!) 88/51   Pulse: 69   75  Resp: 14   (!) 21  Temp:  97.6 F (36.4 C)    TempSrc:  Oral    SpO2: 97%   94%  Weight:       General exam: awake, alert, no acute distress, frail HEENT: atraumatic, clear conjunctiva, anicteric sclera, moist mucus membranes, hearing grossly normal  Respiratory system: CTAB diminished on the right, no wheezes, normal respiratory effort, on room air. Cardiovascular system: normal S1/S2,  RRR, no pedal edema.   Gastrointestinal system: soft, NT, ND Central nervous system: A&O x4. no gross focal neurologic deficits, normal speech Extremities: moves  all, no edema, normal tone Skin: dry, intact, normal temperature Psychiatry: normal mood, congruent affect, judgement and insight appear normal   Data Reviewed:  Notable labs ---  BMP normal except BUN 26, Ca 8.3.  CBC with Hbg 9.2 from 10.5 and platets 110 from 130.  Elevated B12  2582  Family Communication: None present will attempt to call  Disposition: Status is: Inpatient Remains inpatient appropriate because: remains on IV antibiotics pending furhter improvement. Possible d/c tomorrow if stable and improved.   Planned Discharge Destination: Home    Time spent: 42 minutes  Author: Ezekiel Slocumb, DO 01/27/2023 1:58 PM  For on call review [email or web address].

## 2023-01-28 ENCOUNTER — Ambulatory Visit: Payer: Medicare Other | Admitting: Psychologist

## 2023-01-28 ENCOUNTER — Encounter: Payer: Self-pay | Admitting: Internal Medicine

## 2023-01-28 DIAGNOSIS — A419 Sepsis, unspecified organism: Secondary | ICD-10-CM

## 2023-01-28 DIAGNOSIS — J189 Pneumonia, unspecified organism: Secondary | ICD-10-CM

## 2023-01-28 LAB — BASIC METABOLIC PANEL
Anion gap: 7 (ref 5–15)
BUN: 18 mg/dL (ref 8–23)
CO2: 24 mmol/L (ref 22–32)
Calcium: 8.5 mg/dL — ABNORMAL LOW (ref 8.9–10.3)
Chloride: 106 mmol/L (ref 98–111)
Creatinine, Ser: 0.84 mg/dL (ref 0.44–1.00)
GFR, Estimated: >60 mL/min (ref >60)
Glucose, Bld: 94 mg/dL (ref 70–99)
Potassium: 3.6 mmol/L (ref 3.5–5.1)
Sodium: 137 mmol/L (ref 135–145)

## 2023-01-28 LAB — CBC
HCT: 29 % — ABNORMAL LOW (ref 36.0–46.0)
Hemoglobin: 9.2 g/dL — ABNORMAL LOW (ref 12.0–15.0)
MCH: 29.7 pg (ref 26.0–34.0)
MCHC: 31.7 g/dL (ref 30.0–36.0)
MCV: 93.5 fL (ref 80.0–100.0)
Platelets: 111 10*3/uL — ABNORMAL LOW (ref 150–400)
RBC: 3.1 MIL/uL — ABNORMAL LOW (ref 3.87–5.11)
RDW: 14.4 % (ref 11.5–15.5)
WBC: 4 10*3/uL (ref 4.0–10.5)
nRBC: 0 % (ref 0.0–0.2)

## 2023-01-28 MED ORDER — AZITHROMYCIN 500 MG PO TABS: 500.0000 mg | ORAL_TABLET | Freq: Every day | ORAL | 0 refills | Status: AC

## 2023-01-28 MED ORDER — CEFDINIR 300 MG PO CAPS: 300.0000 mg | ORAL_CAPSULE | Freq: Two times a day (BID) | ORAL | 0 refills | Status: AC

## 2023-01-28 NOTE — Plan of Care (Signed)

## 2023-01-28 NOTE — TOC Transition Note (Signed)
Transition of Care [facility]) - CM/SW Discharge Note   Patient Details  Name: [name] [name] MRN: [id number] Date of Birth: 04/28/1941  [facility] CM/SW Contact:  Laurena Slimmer, RN Phone Number: 01/28/2023, 3:16 PM   Clinical Narrative:    Discharge order received.  Patient will discharge home to [place] signing off.          Patient Goals and CMS Choice      Discharge Placement                         Discharge Plan and Services Additional resources added to the After Visit Summary for                                       Social Determinants of Health (SDOH) Interventions SDOH Screenings   Food Insecurity: No Food Insecurity (01/27/2023)  Housing: Low Risk  (01/27/2023)  Transportation Needs: No Transportation Needs (01/27/2023)  Utilities: Not At Risk (01/27/2023)  Depression (PHQ2-9): Medium Risk (08/28/2022)  Financial Resource Strain: Low Risk  (03/12/2018)  Tobacco Use: Low Risk  (01/26/2023)     Readmission Risk Interventions     No data to display

## 2023-01-28 NOTE — Progress Notes (Signed)
Occupational Therapy Treatment Patient Details Name: [name] [name] MRN: [id number] DOB: [birth year] Today's Date: 01/28/2023   History of present illness Pt is an 82 year old female with history of hyperlipidemia, atrial fibrillation on Eliquis, hypertension, monoclonal B-cell lymphocytosis of undetermined significance, CKD 3A, who presents emergency department for chief concerns of generalized weakness, near syncope.  MD assessment includes: Near syncope, SIRS, CAP, focal seizures, monoclonal B-cell lymphocytosis of undetermined significance, and anemia in chronic kidney disease.   OT comments  Patient received semi-reclined in bed with son present. Both agreeable to OT. Tx session targeted increasing activity tolerance for improved ADL completion. Pt completed functional mobility to the bathroom using RW for sinkside grooming. Pt stood at the sink with supervision for ~8 min in order to complete oral care, hand hygiene, and wash her face. Pt then requested to return to supine. Education provided re: the 4 P's (plan, prioritize, pace, position) of energy conservation, pursed lip breathing, diaphragmatic breathing, and specific strategies for ADLs (toileting, grooming, bathing/showering, dressing). OT discussed specific examples of sitting when possible to complete ADL tasks (I.e. dressing, bathing, meal prep), gathering all the necessary items in advance for a certain task, avoiding excessive bending/reaching, and taking rest breaks before you feel tired. Pt demonstrated good carryover of education provided by OT. Pt will benefit from further opportunities to practice implementing energy conservation techniques during self-care tasks before returning home. Pt is making progress toward goal completion. D/C recommendation remains appropriate. OT will continue to follow acutely.    Recommendations for follow up therapy are one component of a multi-disciplinary discharge planning process, led by the  attending physician.  Recommendations may be updated based on patient status, additional functional criteria and insurance authorization.    Follow Up Recommendations  No OT follow up     Assistance Recommended at Discharge Set up Supervision/Assistance  Patient can return home with the following  A little help with walking and/or transfers;A little help with bathing/dressing/bathroom;Assistance with cooking/housework;Assist for transportation;Help with stairs or ramp for entrance   Equipment Recommendations  None recommended by OT    Recommendations for Other Services      Precautions / Restrictions Precautions Precautions: Fall Restrictions Weight Bearing Restrictions: No       Mobility Bed Mobility Overal bed mobility: Modified Independent             General bed mobility comments: for supine<>sit    Transfers Overall transfer level: Needs assistance Equipment used: Rolling walker (2 wheels) Transfers: Sit to/from Stand Sit to Stand: Supervision (from EOB)                 Balance Overall balance assessment: Needs assistance   Sitting balance-Leahy Scale: Normal     Standing balance support: Bilateral upper extremity supported, During functional activity, Single extremity supported Standing balance-Leahy Scale: Good                             ADL either performed or assessed with clinical judgement   ADL Overall ADL's : Needs assistance/impaired     Grooming: Set up;Supervision/safety;Standing;Wash/dry face;Oral care;Wash/dry hands                               Functional mobility during ADLs: Supervision/safety;Rolling walker (2 wheels) (~2f to the bathroom)      Extremity/Trunk Assessment Upper Extremity Assessment Upper Extremity Assessment: Generalized weakness   Lower Extremity Assessment  Lower Extremity Assessment: Generalized weakness        Vision Baseline Vision/History: 1 Wears glasses Patient  Visual Report: No change from baseline     Perception     Praxis      Cognition Arousal/Alertness: Awake/alert Behavior During Therapy: WFL for tasks assessed/performed Overall Cognitive Status: Within Functional Limits for tasks assessed            Exercises Other Exercises Other Exercises: Education provided re: energy conservation techniques with handout provided    Shoulder Instructions       General Comments      Pertinent Vitals/ Pain       Pain Assessment Pain Assessment: No/denies pain  Home Living              Prior Functioning/Environment              Frequency  Min 2X/week        Progress Toward Goals  OT Goals(current goals can now be found in the care plan section)  Progress towards OT goals: Progressing toward goals  Acute Rehab OT Goals Patient Stated Goal: return to ILF OT Goal Formulation: With patient/family Time For Goal Achievement: 02/10/23 Potential to Achieve Goals: Good  Plan Discharge plan remains appropriate;Frequency remains appropriate    Co-evaluation                 AM-PAC OT "6 Clicks" Daily Activity     Outcome Measure   Help from another person eating meals?: None Help from another person taking care of personal grooming?: A Little Help from another person toileting, which includes using toliet, bedpan, or urinal?: A Little Help from another person bathing (including washing, rinsing, drying)?: A Little Help from another person to put on and taking off regular upper body clothing?: None Help from another person to put on and taking off regular lower body clothing?: A Little 6 Click Score: 20    End of Session Equipment Utilized During Treatment: Gait belt;Rolling walker (2 wheels)  OT Visit Diagnosis: Muscle weakness (generalized) (M62.81);History of falling (Z91.81);Other abnormalities of gait and mobility (R26.89)   Activity Tolerance Patient tolerated treatment well   Patient Left in bed;with  call bell/phone within reach;with bed alarm set;with family/visitor present   Nurse Communication Mobility status        Time: [id number] OT Time Calculation (min): 16 min  Charges: OT General Charges $OT Visit: 1 Visit OT Treatments $Self Care/Home Management : 8-22 mins  [facility] MS, OTR/L ascom [phone]  01/28/23, 1:37 PM

## 2023-01-28 NOTE — Discharge Summary (Addendum)
Physician Discharge Summary   Patient: [name] [name] MRN: [id number] DOB: 1941/05/31  Admit date:     01/26/2023  Discharge date: 01/28/23  Discharge Physician: Ezekiel Slocumb   PCP: [name], MD   Recommendations at discharge:   Follow up with Primary Care in 1-2 weeks Repeat CBC, BMP in 1-2 weeks   Discharge Diagnoses: Principal Problem:   Sepsis due to pneumonia [facility]) Active Problems:   Mitral valve prolapse   Pulmonary hypertension (HCC)   Atrial fibrillation, persistent (HCC)   Anemia in chronic kidney disease   Focal seizures (HCC)   PAF (paroxysmal atrial fibrillation) (HCC)   Seizure disorder (HCC)   Hyperlipidemia   Monoclonal B-cell lymphocytosis of undetermined significance   Do not resuscitate status   CAP (community acquired pneumonia)  Resolved Problems:   Near syncope  Hospital Course: [name] [name] is a 82 year old female with history of hyperlipidemia, atrial fibrillation on Eliquis, hypertension, monoclonal B-cell lymphocytosis of undetermined significance, CKD 3A, who presented to the ED on 01/26/2023 for evaluation of worsening generalized weakness over prior few days.  She reported onset of weakness 1 week prior on 2/18.  Reports testing negative for Covid at [facility]. Denies any significant respiratory symptoms, or other symptoms besides weakness and dizziness worse than she typically has.  Pt was hypothermic in the ED with temp 93.4, RR 10, HR 75, BP 109/77, SpO2 of 96% room air.  Labs were notable for mild Cr elevation 1.04, mild anemia Hbg 10.5 and mild thrombocytopenia 130k.   Procal was < 0.10 and lactic acid 1.6 normal.  CK was normal 115.  Chest xray showed right upper lobe pneumonia.  She was started on empiric IV antibiotic coverage for community-acquired pneumonia.  2/27 - pt feels well. Seen by PT with recommendation for outpatient PT after discharge. Patient is medically stable for discharge today.  Sepsis as below - due to PNA,  hypotermia (T 93.4) and tachypnea with evidence of pna  Assessment and Plan: * Sepsis due to pneumonia ([place]) Sepsis POA with hypothermia, tachypnea, in setting of pneumonia --Treated with empiric IV Rocephin, Zithromax --Discharge on PO Zithromax and Omnicef to complete treatement course --Supportive care  - Incentive spirometry, flutter valve - Blood cultures x 2 neg to date --Supplement O2 if sats < 90% on room air  Near syncope-resolved as of 01/28/2023 Ruled Out.  2/26 - patient denies feeling pre-syncopal or passing out.  She reported some dizziness worse than her typical episodes (?hx of vertigo), clarifies that she only had weakness. Due to community-acquired pneumonia. Mgmt of CAP as outlined. Will have PT evaluate. No indication for syncope work up at this time.  CAP (community acquired pneumonia) See Sepsis due to pneumonia  Do not resuscitate status Noted  Monoclonal B-cell lymphocytosis of undetermined significance No acute issues. Monitor.  Hyperlipidemia Continue statin  Seizure disorder (HCC) Resumed on Lamictal, Brivaracetam  PAF (paroxysmal atrial fibrillation) (HCC) Continue metoprolol, Eliquis   Focal seizures (HCC) Continue Lamictal, home Brivaracetam  Anemia in chronic kidney disease Hbg at baseline. Monitor CBC.  Atrial fibrillation, persistent (HCC) Continue metoprolol and Eliquis   Pulmonary hypertension (HCC) Monitor respiratory status  Mitral valve prolapse No acute issues         Consultants: None Procedures performed: NOne  Disposition: Home Diet recommendation:  Cardiac diet DISCHARGE MEDICATION: Allergies as of 01/28/2023       Reactions   Codeine Nausea And Vomiting, Other (See Comments)   migraine   Hydrocodone Nausea  And Vomiting, Other (See Comments)   MIGRAINE   Hydromorphone Nausea And Vomiting, Other (See Comments)   migraine   Molds & Smuts Anxiety, Other (See Comments), Shortness Of Breath   Oxycodone  Nausea And Vomiting, Other (See Comments)   Severe migraine   Meloxicam Other (See Comments)   Severe reflux   Codeine Other (See Comments)   Migraine   Dextromethorphan Other (See Comments)   seizures   Diphen [diphenhydramine Hcl] Other (See Comments)   seizure   Diphenhydramine Other (See Comments)   seizure   Diphenhydramine Hcl    Other reaction(s): Other (See Comments)   Diphenylpyraline Other (See Comments)   Doxycycline Itching, Swelling, Other (See Comments)   Facial   Doxycycline Itching, Swelling   Hydrocodone Other (See Comments)   Migraine   Hydromorphone Other (See Comments)   Migraine   Meloxicam Other (See Comments)   Mobic [meloxicam] Other (See Comments)   reflux   Nsaids    Nsaids Other (See Comments)   Pt on blood thinner   Oxycodone Other (See Comments)   Migraine   Propofol Other (See Comments)   Propofol Other (See Comments)   Very sensitive; patient stated she was told she was told she had apnea        Medication List     TAKE these medications    azithromycin 500 MG tablet Commonly known as: Zithromax Take 1 tablet (500 mg total) by mouth daily for 3 days. Take 1 tablet daily for 3 days.   Briviact 50 MG Tabs Generic drug: Brivaracetam Take 1 tablet by mouth 2 (two) times daily.   butalbital-acetaminophen-caffeine 50-325-40 MG tablet Commonly known as: FIORICET TAKE 1 TABLET BY MOUTH ONCE DAILY AS NEEDED FOR BACK PAIN OR MIGRAINE MAX OF 3 TABS PER DAY   Calcium 600 600 MG Tabs tablet Generic drug: calcium carbonate 600 mg 2 (two) times daily with a meal.   cefdinir 300 MG capsule Commonly known as: OMNICEF Take 1 capsule (300 mg total) by mouth 2 (two) times daily for 3 days.   cyanocobalamin 1000 MCG tablet Commonly known as: VITAMIN B12 Take 1,000 mcg by mouth daily.   Eliquis 5 MG Tabs tablet Generic drug: apixaban TAKE ONE TABLET TWICE DAILY   fluticasone 50 MCG/ACT nasal spray Commonly known as: FLONASE USE 2 SPRAYS  IN BOTH NOSTRILS DAILY   folic acid 1 MG tablet Commonly known as: FOLVITE Take 1 mg by mouth daily.   furosemide 40 MG tablet Commonly known as: LASIX TAKE ONE (1) TABLET BY MOUTH TWICE PER WEEK; TAKE AN ADDITIONAL HALF (1/2) TO ONE (1) TABLET AS NEEDED FOR FLUID RETENTION What changed:  how much to take how to take this   gabapentin 100 MG capsule Commonly known as: NEURONTIN Take 200 mg by mouth 3 (three) times daily.   lamoTRIgine 200 MG tablet Commonly known as: LAMICTAL Take 150 mg in the am & 200 mg in the pm.   losartan 25 MG tablet Commonly known as: COZAAR TAKE 1/2 TABLET BY MOUTH ONCE DAILY   metaxalone 800 MG tablet Commonly known as: SKELAXIN TAKE ONE TABLET BY MOUTH 3 TIMES DAILY What changed: See the new instructions.   metoprolol succinate 25 MG 24 hr tablet Commonly known as: TOPROL-XL TAKE ONE TABLET (25 MG) BY MOUTH EVERY DAY   montelukast 10 MG tablet Commonly known as: SINGULAIR TAKE ONE TABLET BY MOUTH AT BEDTIME   polyethylene glycol powder 17 GM/SCOOP powder Commonly known as: GLYCOLAX/MIRALAX Take  1 Dose by mouth daily.   rosuvastatin 40 MG tablet Commonly known as: CRESTOR TAKE ONE TABLET EVERY DAY   spironolactone 25 MG tablet Commonly known as: ALDACTONE Take 0.5 tablets (12.5 mg total) by mouth every other day.   Ventolin HFA 108 (90 Base) MCG/ACT inhaler Generic drug: albuterol INHALE 2 PUFFS INTO THE LUNGS EVERY 6 HOURS AS NEEDED FOR WHEEZING OR SHORTNESS OF BREATH   Vitamin D 50 MCG (2000 UT) Caps Take 2,000 Units by mouth daily.        Discharge Exam: Filed Weights   01/26/23 1011 01/27/23 2039  Weight: 48 kg 68 kg   General exam: awake, alert, no acute distress HEENT: atraumatic, clear conjunctiva, anicteric sclera, moist mucus membranes, hearing grossly normal  Respiratory system: CTAB, no wheezes, rales or rhonchi, normal respiratory effort. Cardiovascular system: normal S1/S2,  RRR, no JVD, murmurs, rubs,  gallops,  no pedal edema.   Gastrointestinal system: soft, NT, ND, no HSM felt, +bowel sounds. Central nervous system: A&O x4. no gross focal neurologic deficits, normal speech Extremities: moves all , no edema, normal tone Skin: dry, intact, normal temperature, normal color, No rashes, lesions or ulcers Psychiatry: normal mood, congruent affect, judgement and insight appear normal   Condition at discharge: stable  The results of significant diagnostics from this hospitalization (including imaging, microbiology, ancillary and laboratory) are listed below for reference.   Imaging Studies: CT ABDOMEN PELVIS WO CONTRAST  Addendum Date: 01/26/2023   ADDENDUM REPORT: 01/26/2023 14:50 ADDENDUM: Upon re-review of the images, the following additional findings are identified. Acute nondisplaced fracture of the posterior right twelfth rib (series 2, image 27). Acute mildly displaced fracture of the tip of the right L1 transverse process (series 2, image 32). No additional changes. Electronically Signed   By: Davina Poke D.O.   On: 01/26/2023 14:50   Result Date: 01/26/2023 CLINICAL DATA:  Abdominal trauma, blunt EXAM: CT ABDOMEN AND PELVIS WITHOUT CONTRAST TECHNIQUE: Multidetector CT imaging of the abdomen and pelvis was performed following the standard protocol without IV contrast. RADIATION DOSE REDUCTION: This exam was performed according to the departmental dose-optimization program which includes automated exposure control, adjustment of the mA and/or kV according to patient size and/or use of iterative reconstruction technique. COMPARISON:  CT 05/10/2016, 07/10/2022 FINDINGS: Lower chest: Cardiomegaly. Hepatobiliary: Similar size and distribution of innumerable rounded low-density lesions throughout the liver, most compatible with hepatic cysts. Unremarkable gallbladder. No hyperdense gallstone. No biliary dilatation. Pancreas: Unremarkable. No pancreatic ductal dilatation or surrounding  inflammatory changes. Spleen: Normal in size without focal abnormality. Adrenals/Urinary Tract: Adrenal glands are unremarkable. Kidneys are normal, without renal calculi, solid lesion, or hydronephrosis. Bladder is unremarkable. Stomach/Bowel: Stomach is within normal limits. Appendix not definitively seen. A loop of small bowel protrudes into patient's small right femoral hernia. Scattered colonic diverticulosis. No evidence of bowel wall thickening, distention, or inflammatory changes. Moderate volume stool throughout the colon. Vascular/Lymphatic: Aortic atherosclerosis. No enlarged abdominal or pelvic lymph nodes. Reproductive: Uterus and bilateral adnexa are unremarkable. Other: No free air or free fluid. Musculoskeletal: Chronic appearing fracture of the right superior pubic ramus at the pubic root is new from prior. No significant displacement. Chronic fractures of the right inferior pubic ramus and parasymphyseal aspect of the right pubic bone with worsening alignment at both fracture sites. The inferior pubic ramus fracture on the right is ununited. SI joint and pubic symphysis intact without diastasis. Subacute appearing bilateral sacral alar fractures with patchy sclerosis. Lumbar dextrocurvature with multilevel spondylosis. IMPRESSION: 1.  No acute intra-abdominopelvic findings. 2. Subacute-appearing bilateral sacral alar fractures. 3. Subacute nondisplaced right superior pubic ramus fracture. 4. Chronic fractures of the right inferior pubic ramus and parasymphyseal aspect of the right pubic bone with worsening alignment at both fracture sites. The inferior pubic ramus fracture is ununited. 5. Small right femoral hernia containing a loop of small bowel. No evidence of bowel obstruction. 6. Colonic diverticulosis without evidence of acute diverticulitis. 7. Cardiomegaly. 8. Aortic atherosclerosis (ICD10-I70.0). Electronically Signed: By: Davina Poke D.O. On: 01/26/2023 11:12   DG Chest Port 1  View  Result Date: 01/26/2023 CLINICAL DATA:  Shortness of breath EXAM: PORTABLE CHEST 1 VIEW COMPARISON:  06/17/2021 FINDINGS: Left-sided implanted cardiac device remains in place. Stable cardiomegaly status post cardiac valve replacement and left atrial appendage clipping. Aortic atherosclerosis. Patchy airspace opacity within the right upper lobe. No pleural effusion. No pneumothorax. IMPRESSION: Patchy airspace opacity within the right upper lobe, suspicious for pneumonia. Electronically Signed   By: Davina Poke D.O.   On: 01/26/2023 14:47   CT Head Wo Contrast  Result Date: 01/26/2023 CLINICAL DATA:  Headache, new onset, dizziness. EXAM: CT HEAD WITHOUT CONTRAST TECHNIQUE: Contiguous axial images were obtained from the base of the skull through the vertex without intravenous contrast. RADIATION DOSE REDUCTION: This exam was performed according to the departmental dose-optimization program which includes automated exposure control, adjustment of the mA and/or kV according to patient size and/or use of iterative reconstruction technique. COMPARISON:  Head CT dated 07/24/2022 FINDINGS: Brain: Ventricles are stable in size and configuration. There is no mass, hemorrhage, edema or other evidence of acute parenchymal abnormality. No extra-axial hemorrhage. Vascular: Chronic calcified atherosclerotic changes of the large vessels at the skull base. No unexpected hyperdense vessel. Skull: Normal. Negative for fracture or focal lesion. Sinuses/Orbits: No acute finding. Other: None. IMPRESSION: No acute findings. No intracranial mass, hemorrhage or edema. Electronically Signed   By: Franki Cabot M.D.   On: 01/26/2023 11:07    Microbiology: Results for orders placed or performed during the hospital encounter of 01/26/23  Culture, blood (Routine X 2) w Reflex to ID Panel     Status: None (Preliminary result)   Collection Time: 01/26/23  3:58 PM   Specimen: BLOOD LEFT HAND  Result Value Ref Range Status    Specimen Description BLOOD LEFT HAND  Final   Special Requests   Final    BOTTLES DRAWN AEROBIC AND ANAEROBIC Blood Culture results may not be optimal due to an inadequate volume of blood received in culture bottles   Culture   Final    NO GROWTH 2 DAYS Performed at [facility], [address] [place], [place] [address]    Report Status PENDING  Incomplete  Culture, blood (Routine X 2) w Reflex to ID Panel     Status: None (Preliminary result)   Collection Time: 01/26/23  4:00 PM   Specimen: BLOOD RIGHT HAND  Result Value Ref Range Status   Specimen Description BLOOD RIGHT HAND  Final   Special Requests   Final    BOTTLES DRAWN AEROBIC AND ANAEROBIC Blood Culture results may not be optimal due to an inadequate volume of blood received in culture bottles   Culture   Final    NO GROWTH 2 DAYS Performed at [facility], [address], [place], [place] [address]    Report Status PENDING  Incomplete    Labs: CBC: Recent Labs  Lab 01/26/23 1019 01/27/23 0412 01/28/23 0436  WBC 5.3 5.7  4.0  NEUTROABS 3.2  --   --   HGB 10.5* 9.2* 9.2*  HCT 33.5* 29.0* 29.0*  MCV 94.6 93.5 93.5  PLT 130* 110* [id number]*   Basic Metabolic Panel: Recent Labs  Lab 01/26/23 1019 01/27/23 0412 01/28/23 0436  NA 137 139 137  K 4.1 3.6 3.6  CL 102 106 106  CO2 '26 23 24  '$ GLUCOSE 115* 87 94  BUN 35* 26* 18  CREATININE 1.04* 0.86 0.84  CALCIUM 9.5 8.3* 8.5*   Liver Function Tests: No results for input(s): "AST", "ALT", "ALKPHOS", "BILITOT", "PROT", "ALBUMIN" in the last 168 hours. CBG: No results for input(s): "GLUCAP" in the last 168 hours.  Discharge time spent: less than 30 minutes.  Signed: Ezekiel Slocumb, DO [facility] 01/28/2023

## 2023-01-28 NOTE — TOC Initial Note (Signed)
Transition of Care [facility]) - Initial/Assessment Note    Patient Details  Name: [name] [name] MRN: [id number] Date of Birth: 1941/07/01  Transition of [facility] CM/SW Contact:    Laurena Slimmer, RN Phone Number: 01/28/2023, 3:14 PM  Clinical Narrative:                 Spoke with patient at bedside. She is from [place] and will discharge back there today. She states she was previously set up with outpatient therapy at the facility prior to this admission. She plan to continue therapy there. Her daughter will transport her back home.         Patient Goals and CMS Choice            Expected Discharge Plan and Services         Expected Discharge Date: 01/28/23                                    Prior Living Arrangements/Services                       Activities of Daily Living Home Assistive Devices/Equipment: Gilford Rile (specify type), Eyeglasses ADL Screening (condition at time of admission) Patient's cognitive ability adequate to safely complete daily activities?: Yes Is the patient deaf or have difficulty hearing?: No Does the patient have difficulty seeing, even when wearing glasses/contacts?: No Does the patient have difficulty concentrating, remembering, or making decisions?: No Patient able to express need for assistance with ADLs?: Yes Does the patient have difficulty dressing or bathing?: No Independently performs ADLs?: Yes (appropriate for developmental age) Does the patient have difficulty walking or climbing stairs?: Yes Weakness of Legs: None Weakness of Arms/Hands: None  Permission Sought/Granted                  Emotional Assessment              Admission diagnosis:  Dizziness [R42] Near syncope [R55] Fall, initial encounter [W19.XXXA] Patient Active Problem List   Diagnosis Date Noted   Near syncope 01/26/2023   CAP (community acquired pneumonia) 01/26/2023   Sepsis due to pneumonia ([place]) 01/26/2023    Polypharmacy 08/28/2022   Exposure to mold 08/28/2022   Closed bilateral fracture of pubic rami ([place]) 08/28/2022   Candidiasis of anus 08/28/2022   Right groin pain 06/26/2022   Lumbar radiculitis 05/09/2022   Right sided sciatica 04/10/2022   Anemia of chronic disease 04/02/2022   DDD (degenerative disc disease), lumbar 03/31/2022   Spondylosis 03/31/2022   At high risk for falls 03/31/2022   Chronic kidney disease (CKD) stage G3a/A2, moderately decreased glomerular filtration rate (GFR) between 45-59 mL/min/1.73 square meter and albuminuria creatinine ratio between 30-299 mg/g ([place]) 11/13/2021   Do not resuscitate status 08/14/2021   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   Elevated serum lactate dehydrogenase (LDH) 08/01/2021   PAF (paroxysmal atrial fibrillation) ([place]) 06/19/2021   Osteoporosis 06/19/2021   Seizure disorder ([place]) 06/19/2021   Hyperlipidemia 06/19/2021   Concern about end of life 04/24/2021   Chronic bilateral low back pain without sciatica 04/12/2021   Fall as cause of accidental injury at home as place of occurrence 04/12/2021   Acquired thrombophilia ([place]) 06/14/2020   Carotid stenosis 04/21/2020   Inguinal hernia of right side without obstruction or gangrene 02/09/2020   Sinus node dysfunction ([place]) 07/02/2019   Intracranial  atherosclerosis 10/06/2018   Focal seizures ([place]) 05/14/2018   Arthritis of both hands 12/14/2017   Allergy to environmental factors 08/27/2017   Anemia in chronic kidney disease 02/17/2017   Visit for preventive health examination 05/23/2016   Hepatic cyst [id number]   Chronic systolic congestive heart failure ([place]) 05/03/2016   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   H/O mitral valve replacement 10/31/2015   S/P Minimally invasive maze operation for atrial fibrillation 10/31/2015   Atrial fibrillation, persistent ([place])    Restrictive lung disease 09/12/2015   Pulmonary hypertension ([place])  08/08/2015   Nodule of right lung 08/04/2015   Thoracic aorta atherosclerosis ([place]) 02/05/2015   Family history of cerebral aneurysm 08/02/2014   Diverticulosis of colon without hemorrhage 01/09/2014   Thrombocytopenia ([place]) 02/04/2013   IBS (irritable bowel syndrome) 12/31/2012   History of asbestos exposure 06/27/2012   Cataract extraction status of right eye 06/25/2012   RBBB 05/05/2012   Mitral valve prolapse 05/02/2011   PCP:  [name], MD Pharmacy:   [place] [place] [facility] [address] [place] [address] Phone: [phone] Fax: [phone]     Social Determinants of Health (SDOH) Social History: SDOH Screenings   Food Insecurity: No Food Insecurity (01/27/2023)  Housing: Low Risk  (01/27/2023)  Transportation Needs: No Transportation Needs (01/27/2023)  Utilities: Not At Risk (01/27/2023)  Depression (PHQ2-9): Medium Risk (08/28/2022)  Financial Resource Strain: Low Risk  (03/12/2018)  Tobacco Use: Low Risk  (01/26/2023)   SDOH Interventions:     Readmission Risk Interventions     No data to display

## 2023-01-28 NOTE — Plan of Care (Signed)
  Problem: Education: Goal: Knowledge of General Education information will improve Description: Including pain rating scale, medication(s)/side effects and non-pharmacologic comfort measures 01/28/2023 1714 by Shauna Hugh, RN Outcome: Adequate for Discharge 01/28/2023 0801 by Shauna Hugh, RN Outcome: Progressing   Problem: Health Behavior/Discharge Planning: Goal: Ability to manage health-related needs will improve 01/28/2023 1714 by Shauna Hugh, RN Outcome: Adequate for Discharge 01/28/2023 0801 by Shauna Hugh, RN Outcome: Progressing   Problem: Clinical Measurements: Goal: Ability to maintain clinical measurements within normal limits will improve 01/28/2023 1714 by Shauna Hugh, RN Outcome: Adequate for Discharge 01/28/2023 0801 by Shauna Hugh, RN Outcome: Progressing Goal: Will remain free from infection 01/28/2023 1714 by Shauna Hugh, RN Outcome: Adequate for Discharge 01/28/2023 0801 by Shauna Hugh, RN Outcome: Progressing Goal: Diagnostic test results will improve 01/28/2023 1714 by Shauna Hugh, RN Outcome: Adequate for Discharge 01/28/2023 0801 by Shauna Hugh, RN Outcome: Progressing Goal: Respiratory complications will improve 01/28/2023 1714 by Shauna Hugh, RN Outcome: Adequate for Discharge 01/28/2023 0801 by Shauna Hugh, RN Outcome: Progressing Goal: Cardiovascular complication will be avoided 01/28/2023 1714 by Shauna Hugh, RN Outcome: Adequate for Discharge 01/28/2023 0801 by Shauna Hugh, RN Outcome: Progressing   Problem: Activity: Goal: Risk for activity intolerance will decrease 01/28/2023 1714 by Shauna Hugh, RN Outcome: Adequate for Discharge 01/28/2023 0801 by Shauna Hugh, RN Outcome: Progressing   Problem: Nutrition: Goal: Adequate nutrition will be maintained 01/28/2023 1714 by Shauna Hugh, RN Outcome: Adequate for Discharge 01/28/2023 0801 by Shauna Hugh, RN Outcome: Progressing   Problem: Coping: Goal: Level of anxiety  will decrease 01/28/2023 1714 by Shauna Hugh, RN Outcome: Adequate for Discharge 01/28/2023 0801 by Shauna Hugh, RN Outcome: Progressing   Problem: Elimination: Goal: Will not experience complications related to bowel motility 01/28/2023 1714 by Shauna Hugh, RN Outcome: Adequate for Discharge 01/28/2023 0801 by Shauna Hugh, RN Outcome: Progressing Goal: Will not experience complications related to urinary retention 01/28/2023 1714 by Shauna Hugh, RN Outcome: Adequate for Discharge 01/28/2023 0801 by Shauna Hugh, RN Outcome: Progressing   Problem: Pain Managment: Goal: General experience of comfort will improve 01/28/2023 1714 by Shauna Hugh, RN Outcome: Adequate for Discharge 01/28/2023 0801 by Shauna Hugh, RN Outcome: Progressing   Problem: Safety: Goal: Ability to remain free from injury will improve 01/28/2023 1714 by Shauna Hugh, RN Outcome: Adequate for Discharge 01/28/2023 0801 by Shauna Hugh, RN Outcome: Progressing   Problem: Skin Integrity: Goal: Risk for impaired skin integrity will decrease 01/28/2023 1714 by Shauna Hugh, RN Outcome: Adequate for Discharge 01/28/2023 0801 by Shauna Hugh, RN Outcome: Progressing

## 2023-01-29 ENCOUNTER — Telehealth: Payer: Self-pay | Admitting: *Deleted

## 2023-01-29 NOTE — ED Notes (Signed)
Pt's DNR form/ MOST form/ Stored medication/ and HCPA documents were never returned to pt as well as other medical documents. Items were inadvertently found in CPOD on 01/29/23. Pt has since been discharged, attempted to call family/patient at [phone] to inform them of documentation recovery and no one was able to answer. VM left notifiying persons of documents needed for pick up and that they could be found with the ER charge nurse.   Daughter contacted from demographics section Annett Gula [phone]) and notified of documents for pickup via VM.

## 2023-01-29 NOTE — Transitions of Care (Post Inpatient/ED Visit) (Signed)
   01/29/2023  Name: [name] [name] MRN: [id number] DOB: 02-16-1941  Today's TOC FU Call Status: Today's TOC FU Call Status:: Successful TOC FU Call Competed TOC FU Call Complete Date: 01/29/23  Transition Care Management Follow-up Telephone Call Date of Discharge: 01/28/23 Discharge Facility: [facility]) Type of Discharge: Inpatient Admission Primary Inpatient Discharge Diagnosis:: Sepsis due to pneumonia How have you been since you were released from the hospital?: Better Any questions or concerns?: No  Items Reviewed: Did you receive and understand the discharge instructions provided?: Yes Medications obtained and verified?: Yes (Medications Reviewed) Any new allergies since your discharge?: No Dietary orders reviewed?: No Do you have support at home?: Yes People in Home: child(ren), adult Name of Support/Comfort Primary Source: cheryl dtr but patient lives in twin [place] and Equipment/Supplies: [place] Ordered?: Yes Name of [place]:: [place] Has Agency set up a time to come to your home?: Yes [place] Visit Date: 01/29/23 Any new equipment or medical supplies ordered?: No  Functional Questionnaire: Do you need assistance with bathing/showering or dressing?: Yes Do you need assistance with meal preparation?: Yes Do you need assistance with eating?: No Do you have difficulty maintaining continence: No Do you need assistance with getting out of bed/getting out of a chair/moving?: No Do you have difficulty managing or taking your medications?: No  Folllow up appointments reviewed: PCP Follow-up appointment confirmed?: Yes Date of PCP follow-up appointment?: 02/03/23 (Care guide scheduled aoppointment) Follow-up Provider: Dr Deborra [facility] Follow-up appointment confirmed?: NA Do you need transportation to your follow-up appointment?: No Do you understand care  options if your condition(s) worsen?: Yes-patient verbalized understanding  SDOH Interventions Today    Flowsheet Row Most Recent Value  SDOH Interventions   Food Insecurity Interventions Intervention Not Indicated  Housing Interventions Intervention Not Indicated  Transportation Interventions Intervention Not Indicated      Interventions Today    Flowsheet Row Most Recent Value  General Interventions   General Interventions Discussed/Reviewed Doctor Visits  [care guide scheduled follow up visit]  Exercise Interventions   Exercise Discussed/Reviewed Exercise Discussed  [physical therapy and OT started]       [facility] [phone]

## 2023-01-31 LAB — CULTURE, BLOOD (ROUTINE X 2)
Culture: NO GROWTH
Culture: NO GROWTH

## 2023-02-03 ENCOUNTER — Ambulatory Visit (INDEPENDENT_AMBULATORY_CARE_PROVIDER_SITE_OTHER): Payer: Medicare Other | Admitting: Internal Medicine

## 2023-02-03 ENCOUNTER — Encounter: Payer: Self-pay | Admitting: Internal Medicine

## 2023-02-03 VITALS — BP 114/62 | HR 84 | Temp 97.4°F | Ht 65.0 in | Wt 150.8 lb

## 2023-02-03 DIAGNOSIS — Z09 Encounter for follow-up examination after completed treatment for conditions other than malignant neoplasm: Secondary | ICD-10-CM

## 2023-02-03 DIAGNOSIS — D649 Anemia, unspecified: Secondary | ICD-10-CM

## 2023-02-03 DIAGNOSIS — N1831 Chronic kidney disease, stage 3a: Secondary | ICD-10-CM

## 2023-02-03 DIAGNOSIS — J189 Pneumonia, unspecified organism: Secondary | ICD-10-CM

## 2023-02-03 DIAGNOSIS — D72820 Lymphocytosis (symptomatic): Secondary | ICD-10-CM | POA: Diagnosis not present

## 2023-02-03 DIAGNOSIS — D696 Thrombocytopenia, unspecified: Secondary | ICD-10-CM | POA: Diagnosis not present

## 2023-02-03 DIAGNOSIS — D638 Anemia in other chronic diseases classified elsewhere: Secondary | ICD-10-CM

## 2023-02-03 LAB — CBC WITH DIFFERENTIAL/PLATELET
Basophils Absolute: 0 10*3/uL (ref 0.0–0.1)
Basophils Relative: 1 % (ref 0.0–3.0)
Eosinophils Absolute: 0.2 10*3/uL (ref 0.0–0.7)
Eosinophils Relative: 3.5 % (ref 0.0–5.0)
HCT: 27.5 % — ABNORMAL LOW (ref 36.0–46.0)
Hemoglobin: 9.1 g/dL — ABNORMAL LOW (ref 12.0–15.0)
Lymphocytes Relative: 20.2 % (ref 12.0–46.0)
Lymphs Abs: 0.9 10*3/uL (ref 0.7–4.0)
MCHC: 33.2 g/dL (ref 30.0–36.0)
MCV: 90.7 fl (ref 78.0–100.0)
Monocytes Absolute: 0.3 10*3/uL (ref 0.1–1.0)
Monocytes Relative: 7.9 % (ref 3.0–12.0)
Neutro Abs: 3 10*3/uL (ref 1.4–7.7)
Neutrophils Relative %: 67.4 % (ref 43.0–77.0)
Platelets: 117 10*3/uL — ABNORMAL LOW (ref 150.0–400.0)
RBC: 3.03 Mil/uL — ABNORMAL LOW (ref 3.87–5.11)
RDW: 14.6 % (ref 11.5–15.5)
WBC: 4.4 10*3/uL (ref 4.0–10.5)

## 2023-02-03 MED ORDER — ROSUVASTATIN CALCIUM 40 MG PO TABS: 40.0000 mg | ORAL_TABLET | Freq: Every day | ORAL | 3 refills | Status: DC

## 2023-02-03 NOTE — Assessment & Plan Note (Signed)
Patient is stable post discharge and has no new issues or questions about discharge plans at the visit today for hospital follow up.  I have reviewed the records from the hospital admission in detail with patient today. 

## 2023-02-03 NOTE — Assessment & Plan Note (Signed)
Repeating iron , SPEP and IFE today

## 2023-02-03 NOTE — Assessment & Plan Note (Signed)
RUL infiltrate on admission film with sepsis syndrome (hypothermia, tachycardia).  Treated with 2 antibiotics , no probiotic.  Lung exam norma .  Repeat  film end of March

## 2023-02-03 NOTE — Progress Notes (Signed)
Subjective:  Patient ID: [name], female    DOB: [birth year]  Age: 82 y.o. MRN: [id number]  CC: The primary encounter diagnosis was Monoclonal B-cell lymphocytosis of undetermined significance. Diagnoses of Thrombocytopenia ([place]), Anemia, unspecified type, Hospital discharge follow-up, Chronic kidney disease (CKD) stage G3a/A2, moderately decreased glomerular filtration rate (GFR) between 45-59 mL/min/1.73 square meter and albuminuria creatinine ratio between 30-299 mg/g ([place]), Community acquired pneumonia of right upper lobe of lung, and Anemia of chronic disease were also pertinent to this visit.   HPI SINDA YANAGI presents for  Chief Complaint  Patient presents with   Hospitalization Follow-up   Kierria is an 82 yr old female with seizure disorder , PAF s/p pacemaker implantation, who was admitted to [facility] with CAP /sepsis syndrome and  RUL infiltrate (hypothermia, tachycardia) after feeling poorly for nearly a week . Marland KitchenHx:  became nauseated,  dizzy,  then was  too dizzy to get up off the bathroom floor for 26 hours  except to vomit and use the bathroom.  she did  not use her LifeAlert which she was wearing ,  finally called [employer]  after 26 hours and was sent to ER .  She ruled out for AMI and was treated for RUL CAP with 2 drug regimen and discharged non Feb 27 with azithromycin and Omnicef which she finished in 3 days   Probiotics advised ? NO .  Not taking any.  Stools have not been loose.    Still Feeling very weak and fatigued.  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  Daughter has been wanting her to  have a home health  aide at home to help with showers etc but patient does not want  having aide in her home that she has to "entertain" and schedule her showers around the aide's schedule.  Bathroom is equipped with grabs bars and a shower seat .  Set up to get TL physical therapy at home  2/week .   History of broken  pelvis managend by [employer] .   Voice getting weak due to lack of speech therapy exercises.   Appetite slow to imrprove.   Outpatient Medications Prior to Visit  Medication Sig Dispense Refill   BRIVIACT 50 MG TABS Take 1 tablet by mouth 2 (two) times daily.     butalbital-acetaminophen-caffeine (FIORICET) 50-325-40 MG tablet TAKE 1 TABLET BY MOUTH ONCE DAILY AS NEEDED FOR BACK PAIN OR MIGRAINE MAX OF 3 TABS PER DAY 90 tablet 0   calcium carbonate (CALCIUM 600) 600 MG TABS tablet 600 mg 2 (two) times daily with a meal.     Cholecalciferol (VITAMIN D) 2000 units CAPS Take 2,000 Units by mouth daily.     ELIQUIS 5 MG TABS tablet TAKE ONE TABLET TWICE DAILY 180 tablet 2   fluticasone (FLONASE) 50 MCG/ACT nasal spray USE 2 SPRAYS IN BOTH NOSTRILS DAILY 16 g 11   folic acid (FOLVITE) 1 MG tablet Take 1 mg by mouth daily.     furosemide (LASIX) 40 MG tablet TAKE ONE (1) TABLET BY MOUTH TWICE PER WEEK; TAKE AN ADDITIONAL HALF (1/2) TO ONE (1) TABLET AS NEEDED FOR FLUID RETENTION (Patient taking differently: Take 40 mg by mouth. TAKE ONE (1) TABLET BY MOUTH TWICE PER WEEK; TAKE AN ADDITIONAL HALF (1/2) TO ONE (1) TABLET AS NEEDED FOR FLUID RETENTION) 90 tablet 1   gabapentin (NEURONTIN) 100 MG capsule Take 200 mg by  mouth 3 (three) times daily.     lamoTRIgine (LAMICTAL) 200 MG tablet Take 150 mg in the am & 200 mg in the pm.     losartan (COZAAR) 25 MG tablet TAKE 1/2 TABLET BY MOUTH ONCE DAILY 15 tablet 0   metaxalone (SKELAXIN) 800 MG tablet TAKE ONE TABLET BY MOUTH 3 TIMES DAILY (Patient taking differently: Take 800 mg by mouth daily as needed for muscle spasms.) 90 tablet 1   metoprolol succinate (TOPROL-XL) 25 MG 24 hr tablet TAKE ONE TABLET (25 MG) BY MOUTH EVERY DAY 90 tablet 0   montelukast (SINGULAIR) 10 MG tablet TAKE ONE TABLET BY MOUTH AT BEDTIME 90 tablet 1   polyethylene glycol powder (GLYCOLAX/MIRALAX) 17 GM/SCOOP powder Take 1 Dose by mouth daily.     spironolactone  (ALDACTONE) 25 MG tablet Take 0.5 tablets (12.5 mg total) by mouth every other day. 45 tablet 0   VENTOLIN HFA 108 (90 Base) MCG/ACT inhaler INHALE 2 PUFFS INTO THE LUNGS EVERY 6 HOURS AS NEEDED FOR WHEEZING OR SHORTNESS OF BREATH 18 g 1   vitamin B-12 (CYANOCOBALAMIN) 1000 MCG tablet Take 1,000 mcg by mouth daily.     rosuvastatin (CRESTOR) 40 MG tablet TAKE ONE TABLET EVERY DAY 90 tablet 1   No facility-administered medications prior to visit.    Review of Systems;  Patient denies headache, fevers, malaise, unintentional weight loss, skin rash, eye pain, sinus congestion and sinus pain, sore throat, dysphagia,  hemoptysis , cough, dyspnea, wheezing, chest pain, palpitations, orthopnea, edema, abdominal pain, nausea, melena, diarrhea, constipation, flank pain, dysuria, hematuria, urinary  Frequency, nocturia, numbness, tingling, seizures,  Focal weakness, Loss of consciousness,  Tremor, insomnia, depression, anxiety, and suicidal ideation.      Objective:  BP 114/62   Pulse 84   Temp (!) 97.4 F (36.3 C) (Oral)   Ht '5\' 5"'$  (1.651 m)   Wt 150 lb 12.8 oz (68.4 kg)   SpO2 99%   BMI 25.09 kg/m   BP Readings from Last 3 Encounters:  02/03/23 114/62  01/28/23 (!) 96/48  11/13/22 (!) 100/58    Wt Readings from Last 3 Encounters:  02/03/23 150 lb 12.8 oz (68.4 kg)  01/27/23 149 lb 14.6 oz (68 kg)  11/13/22 148 lb 9.6 oz (67.4 kg)    Physical Exam Vitals reviewed.  Constitutional:      General: She is not in acute distress.    Appearance: Normal appearance. She is normal weight. She is not ill-appearing, toxic-appearing or diaphoretic.  HENT:     Head: Normocephalic.  Eyes:     General: No scleral icterus.       Right eye: No discharge.        Left eye: No discharge.     Conjunctiva/sclera: Conjunctivae normal.  Cardiovascular:     Rate and Rhythm: Normal rate and regular rhythm.     Heart sounds: Normal heart sounds.  Pulmonary:     Effort: Pulmonary effort is normal. No  respiratory distress.     Breath sounds: Normal breath sounds.  Musculoskeletal:        General: Normal range of motion.  Skin:    General: Skin is warm and dry.  Neurological:     General: No focal deficit present.     Mental Status: She is alert and oriented to person, place, and time. Mental status is at baseline.  Psychiatric:        Mood and Affect: Mood normal.  Behavior: Behavior normal.        Thought Content: Thought content normal.        Judgment: Judgment normal.     Lab Results  Component Value Date   HGBA1C 5.5 02/11/2020   HGBA1C 5.7 05/21/2019   HGBA1C 5.3 04/07/2018    Lab Results  Component Value Date   CREATININE 0.84 01/28/2023   CREATININE 0.86 01/27/2023   CREATININE 1.04 (H) 01/26/2023    Lab Results  Component Value Date   WBC 4.0 01/28/2023   HGB 9.2 (L) 01/28/2023   HCT 29.0 (L) 01/28/2023   PLT 111 (L) 01/28/2023   GLUCOSE 94 01/28/2023   CHOL 143 05/21/2019   TRIG 60.0 05/21/2019   HDL 59.50 05/21/2019   LDLDIRECT 113.0 02/15/2016   LDLCALC 71 05/21/2019   ALT 17 10/03/2022   AST 25 10/03/2022   NA 137 01/28/2023   K 3.6 01/28/2023   CL 106 01/28/2023   CREATININE 0.84 01/28/2023   BUN 18 01/28/2023   CO2 24 01/28/2023   TSH 2.991 07/03/2021   INR 1.2 06/18/2021   HGBA1C 5.5 02/11/2020   MICROALBUR 2.8 (H) 11/13/2021    CT ABDOMEN PELVIS WO CONTRAST  Addendum Date: 01/26/2023   ADDENDUM REPORT: 01/26/2023 14:50 ADDENDUM: Upon re-review of the images, the following additional findings are identified. Acute nondisplaced fracture of the posterior right twelfth rib (series 2, image 27). Acute mildly displaced fracture of the tip of the right L1 transverse process (series 2, image 32). No additional changes. Electronically Signed   By: Davina Poke D.O.   On: 01/26/2023 14:50   Result Date: 01/26/2023 CLINICAL DATA:  Abdominal trauma, blunt EXAM: CT ABDOMEN AND PELVIS WITHOUT CONTRAST TECHNIQUE: Multidetector CT imaging of  the abdomen and pelvis was performed following the standard protocol without IV contrast. RADIATION DOSE REDUCTION: This exam was performed according to the departmental dose-optimization program which includes automated exposure control, adjustment of the mA and/or kV according to patient size and/or use of iterative reconstruction technique. COMPARISON:  CT 05/10/2016, 07/10/2022 FINDINGS: Lower chest: Cardiomegaly. Hepatobiliary: Similar size and distribution of innumerable rounded low-density lesions throughout the liver, most compatible with hepatic cysts. Unremarkable gallbladder. No hyperdense gallstone. No biliary dilatation. Pancreas: Unremarkable. No pancreatic ductal dilatation or surrounding inflammatory changes. Spleen: Normal in size without focal abnormality. Adrenals/Urinary Tract: Adrenal glands are unremarkable. Kidneys are normal, without renal calculi, solid lesion, or hydronephrosis. Bladder is unremarkable. Stomach/Bowel: Stomach is within normal limits. Appendix not definitively seen. A loop of small bowel protrudes into patient's small right femoral hernia. Scattered colonic diverticulosis. No evidence of bowel wall thickening, distention, or inflammatory changes. Moderate volume stool throughout the colon. Vascular/Lymphatic: Aortic atherosclerosis. No enlarged abdominal or pelvic lymph nodes. Reproductive: Uterus and bilateral adnexa are unremarkable. Other: No free air or free fluid. Musculoskeletal: Chronic appearing fracture of the right superior pubic ramus at the pubic root is new from prior. No significant displacement. Chronic fractures of the right inferior pubic ramus and parasymphyseal aspect of the right pubic bone with worsening alignment at both fracture sites. The inferior pubic ramus fracture on the right is ununited. SI joint and pubic symphysis intact without diastasis. Subacute appearing bilateral sacral alar fractures with patchy sclerosis. Lumbar dextrocurvature with  multilevel spondylosis. IMPRESSION: 1. No acute intra-abdominopelvic findings. 2. Subacute-appearing bilateral sacral alar fractures. 3. Subacute nondisplaced right superior pubic ramus fracture. 4. Chronic fractures of the right inferior pubic ramus and parasymphyseal aspect of the right pubic bone with worsening alignment at  both fracture sites. The inferior pubic ramus fracture is ununited. 5. Small right femoral hernia containing a loop of small bowel. No evidence of bowel obstruction. 6. Colonic diverticulosis without evidence of acute diverticulitis. 7. Cardiomegaly. 8. Aortic atherosclerosis (ICD10-I70.0). Electronically Signed: By: Davina Poke D.O. On: 01/26/2023 11:12   DG Chest Port 1 View  Result Date: 01/26/2023 CLINICAL DATA:  Shortness of breath EXAM: PORTABLE CHEST 1 VIEW COMPARISON:  06/17/2021 FINDINGS: Left-sided implanted cardiac device remains in place. Stable cardiomegaly status post cardiac valve replacement and left atrial appendage clipping. Aortic atherosclerosis. Patchy airspace opacity within the right upper lobe. No pleural effusion. No pneumothorax. IMPRESSION: Patchy airspace opacity within the right upper lobe, suspicious for pneumonia. Electronically Signed   By: Davina Poke D.O.   On: 01/26/2023 14:47   CT Head Wo Contrast  Result Date: 01/26/2023 CLINICAL DATA:  Headache, new onset, dizziness. EXAM: CT HEAD WITHOUT CONTRAST TECHNIQUE: Contiguous axial images were obtained from the base of the skull through the vertex without intravenous contrast. RADIATION DOSE REDUCTION: This exam was performed according to the departmental dose-optimization program which includes automated exposure control, adjustment of the mA and/or kV according to patient size and/or use of iterative reconstruction technique. COMPARISON:  Head CT dated 07/24/2022 FINDINGS: Brain: Ventricles are stable in size and configuration. There is no mass, hemorrhage, edema or other evidence of acute  parenchymal abnormality. No extra-axial hemorrhage. Vascular: Chronic calcified atherosclerotic changes of the large vessels at the skull base. No unexpected hyperdense vessel. Skull: Normal. Negative for fracture or focal lesion. Sinuses/Orbits: No acute finding. Other: None. IMPRESSION: No acute findings. No intracranial mass, hemorrhage or edema. Electronically Signed   By: Franki Cabot M.D.   On: 01/26/2023 11:07    Assessment & Plan:  .Monoclonal B-cell lymphocytosis of undetermined significance Assessment & Plan: Repeating her SPEP and Urine IFE given her worsening anemia and fatigue  Orders: -     CBC with Differential/Platelet -     Protein electrophoresis, serum -     IFE AND PE, RANDOM URINE; Future  Thrombocytopenia (HCC) -     CBC with Differential/Platelet  Anemia, unspecified type -     Iron, TIBC and Ferritin Panel  Hospital discharge follow-up Assessment & Plan: Patient is stable post discharge and has no new issues or questions about discharge plans at the visit today for hospital follow up.  I have reviewed the records from the hospital admission in detail with patient today.    Chronic kidney disease (CKD) stage G3a/A2, moderately decreased glomerular filtration rate (GFR) between 45-59 mL/min/1.73 square meter and albuminuria creatinine ratio between 30-299 mg/g [facility]) Assessment & Plan: She has been screened for MM and renal ultrasound showed cortical thinning.  She does not wasn't to see a nephrologist at this time .   Orders: -     COMPLETE METABOLIC PANEL WITH GFR  Community acquired pneumonia of right upper lobe of lung Assessment & Plan: RUL infiltrate on admission film with sepsis syndrome (hypothermia, tachycardia).  Treated with 2 antibiotics , no probiotic.  Lung exam norma .  Repeat  film end of March   Orders: -     DG Chest 2 View; Future  Anemia of chronic disease Assessment & Plan: Repeating iron , SPEP and IFE today    Other orders -      Rosuvastatin Calcium; Take 1 tablet (40 mg total) by mouth daily.  Dispense: 90 tablet; Refill: 3     I provided 30 minutes  of face-to-face time during this encounter reviewing patient's last visit with me, patient's  most recent visit with cardiology,  nephrology,  and neurology,  recent surgical and non surgical procedures, previous  labs and imaging studies, counseling on currently addressed issues,  and post visit ordering to diagnostics and therapeutics .   Follow-up: Return in about 3 months (around 05/06/2023).   [name], MD

## 2023-02-03 NOTE — Assessment & Plan Note (Signed)
Repeating her SPEP and Urine IFE given her worsening anemia and fatigue

## 2023-02-03 NOTE — Assessment & Plan Note (Signed)
She has been screened for MM and renal ultrasound showed cortical thinning.  She does not wasn't to see a nephrologist at this time .

## 2023-02-03 NOTE — Patient Instructions (Addendum)
You are due for you medicare annual wellness visit, please schedule this at checkout.   Daily use of Probiotics for 3 weeks advised to reduce risk of C dificile colitis.    We will Repeat chest xray at the end of march  Do not lie on floor for more than 2 hours without calling for help !  The muscles will break down and cause kidney failure   I am repeating iron, CBC and myeloma tests today

## 2023-02-04 LAB — COMPLETE METABOLIC PANEL WITH GFR
AG Ratio: 2.3 (calc) (ref 1.0–2.5)
ALT: 16 U/L (ref 6–29)
AST: 23 U/L (ref 10–35)
Albumin: 4.2 g/dL (ref 3.6–5.1)
Alkaline phosphatase (APISO): 97 U/L (ref 37–153)
BUN/Creatinine Ratio: 23 (calc) — ABNORMAL HIGH (ref 6–22)
BUN: 23 mg/dL (ref 7–25)
CO2: 25 mmol/L (ref 20–32)
Calcium: 9.4 mg/dL (ref 8.6–10.4)
Chloride: 103 mmol/L (ref 98–110)
Creat: 1 mg/dL — ABNORMAL HIGH (ref 0.60–0.95)
Globulin: 1.8 g/dL (calc) — ABNORMAL LOW (ref 1.9–3.7)
Glucose, Bld: 77 mg/dL (ref 65–99)
Potassium: 3.9 mmol/L (ref 3.5–5.3)
Sodium: 141 mmol/L (ref 135–146)
Total Bilirubin: 0.5 mg/dL (ref 0.2–1.2)
Total Protein: 6 g/dL — ABNORMAL LOW (ref 6.1–8.1)
eGFR: 56 mL/min/{1.73_m2} — ABNORMAL LOW (ref >=60)

## 2023-02-06 LAB — IFE AND PE, RANDOM URINE
% BETA, Urine: 12 %
ALBUMIN, U: 75.1 %
ALPHA 1 URINE: 0.8 %
ALPHA-2-GLOBULIN, U: 3.8 %
GAMMA GLOBULIN URINE: 8.3 %
Protein, Ur: 10.3 mg/dL

## 2023-02-06 LAB — IRON,TIBC AND FERRITIN PANEL
%SAT: 11 % (calc) — ABNORMAL LOW (ref 16–45)
Ferritin: 21 ng/mL (ref 16–288)
Iron: 43 ug/dL — ABNORMAL LOW (ref 45–160)
TIBC: 390 mcg/dL (calc) (ref 250–450)

## 2023-02-06 LAB — PROTEIN ELECTROPHORESIS, SERUM
Albumin ELP: 3.9 g/dL (ref 3.8–4.8)
Alpha 1: 0.4 g/dL — ABNORMAL HIGH (ref 0.2–0.3)
Alpha 2: 0.6 g/dL (ref 0.5–0.9)
Beta 2: 0.2 g/dL (ref 0.2–0.5)
Beta Globulin: 0.5 g/dL (ref 0.4–0.6)
Gamma Globulin: 0.5 g/dL — ABNORMAL LOW (ref 0.8–1.7)
Total Protein: 6.1 g/dL (ref 6.1–8.1)

## 2023-02-10 ENCOUNTER — Encounter: Payer: Self-pay | Admitting: Internal Medicine

## 2023-02-11 ENCOUNTER — Other Ambulatory Visit: Payer: Self-pay | Admitting: Cardiovascular Disease

## 2023-02-11 DIAGNOSIS — I4819 Other persistent atrial fibrillation: Secondary | ICD-10-CM

## 2023-02-11 DIAGNOSIS — I5022 Chronic systolic (congestive) heart failure: Secondary | ICD-10-CM

## 2023-02-12 ENCOUNTER — Ambulatory Visit
Admission: RE | Admit: 2023-02-12 | Discharge: 2023-02-12 | Disposition: A | Discharge status: Home / Self Care | Payer: Medicare Other | Source: Ambulatory Visit | Attending: Internal Medicine | Admitting: Internal Medicine

## 2023-02-12 DIAGNOSIS — Z1231 Encounter for screening mammogram for malignant neoplasm of breast: Secondary | ICD-10-CM | POA: Diagnosis present

## 2023-02-25 ENCOUNTER — Ambulatory Visit (INDEPENDENT_AMBULATORY_CARE_PROVIDER_SITE_OTHER): Payer: Medicare Other

## 2023-02-25 DIAGNOSIS — I428 Other cardiomyopathies: Secondary | ICD-10-CM | POA: Diagnosis not present

## 2023-02-25 LAB — CUP PACEART REMOTE DEVICE CHECK
Battery Impedance: 1204 Ohm
Battery Remaining Longevity: 45 mo
Battery Voltage: 2.77 V
Brady Statistic AP VP Percent: 76 %
Brady Statistic AP VS Percent: 0 %
Brady Statistic AS VP Percent: 24 %
Brady Statistic AS VS Percent: 0 %
Date Time Interrogation Session: 20240326143507
Implantable Lead Connection Status: 753985
Implantable Lead Connection Status: 753985
Implantable Lead Implant Date: 20161205
Implantable Lead Implant Date: 20170321
Implantable Lead Location: 753859
Implantable Lead Location: 753860
Implantable Lead Model: 5076
Implantable Lead Model: 5076
Implantable Pulse Generator Implant Date: 20161205
Lead Channel Impedance Value: 400 Ohm
Lead Channel Impedance Value: 594 Ohm
Lead Channel Pacing Threshold Amplitude: 0.75 V
Lead Channel Pacing Threshold Amplitude: 1 V
Lead Channel Pacing Threshold Pulse Width: 0.4 ms
Lead Channel Pacing Threshold Pulse Width: 0.4 ms
Lead Channel Setting Pacing Amplitude: 2 V
Lead Channel Setting Pacing Amplitude: 2.5 V
Lead Channel Setting Pacing Pulse Width: 0.4 ms
Lead Channel Setting Sensing Sensitivity: 4 mV
Zone Setting Status: 755011
Zone Setting Status: 755011

## 2023-03-03 ENCOUNTER — Other Ambulatory Visit: Payer: Medicare Other

## 2023-03-03 ENCOUNTER — Ambulatory Visit (INDEPENDENT_AMBULATORY_CARE_PROVIDER_SITE_OTHER): Payer: Medicare Other

## 2023-03-03 DIAGNOSIS — J189 Pneumonia, unspecified organism: Secondary | ICD-10-CM | POA: Diagnosis not present

## 2023-03-04 ENCOUNTER — Encounter: Payer: Self-pay | Admitting: Cardiology

## 2023-03-04 ENCOUNTER — Ambulatory Visit: Payer: Medicare Other | Attending: Cardiovascular Disease | Admitting: Cardiology

## 2023-03-04 VITALS — BP 100/62 | HR 80 | Ht 65.0 in | Wt 147.4 lb

## 2023-03-04 DIAGNOSIS — E785 Hyperlipidemia, unspecified: Secondary | ICD-10-CM | POA: Diagnosis present

## 2023-03-04 DIAGNOSIS — L97919 Non-pressure chronic ulcer of unspecified part of right lower leg with unspecified severity: Secondary | ICD-10-CM | POA: Diagnosis present

## 2023-03-04 DIAGNOSIS — Z952 Presence of prosthetic heart valve: Secondary | ICD-10-CM | POA: Diagnosis present

## 2023-03-04 DIAGNOSIS — I428 Other cardiomyopathies: Secondary | ICD-10-CM

## 2023-03-04 DIAGNOSIS — G40909 Epilepsy, unspecified, not intractable, without status epilepticus: Secondary | ICD-10-CM | POA: Diagnosis present

## 2023-03-04 DIAGNOSIS — R600 Localized edema: Secondary | ICD-10-CM | POA: Diagnosis present

## 2023-03-04 DIAGNOSIS — I480 Paroxysmal atrial fibrillation: Secondary | ICD-10-CM | POA: Diagnosis present

## 2023-03-04 DIAGNOSIS — I471 Supraventricular tachycardia, unspecified: Secondary | ICD-10-CM | POA: Diagnosis present

## 2023-03-04 DIAGNOSIS — I495 Sick sinus syndrome: Secondary | ICD-10-CM

## 2023-03-04 DIAGNOSIS — I5022 Chronic systolic (congestive) heart failure: Secondary | ICD-10-CM | POA: Diagnosis present

## 2023-03-04 MED ORDER — LOSARTAN POTASSIUM 25 MG PO TABS: 12.5000 mg | ORAL_TABLET | Freq: Every day | ORAL | 3 refills | Status: DC

## 2023-03-04 NOTE — Patient Instructions (Addendum)
Medication Instructions:  Your physician has recommended you make the following change in your medication:   INCREASE Lasix 3 times a week for 2 weeks then go back to 2 times a week.   *If you need a refill on your cardiac medications before your next appointment, please call your pharmacy*   Lab Work: BMP in 2 weeks. No appointment is needed. Go to [facility] and check in at registration desk.   If you have labs (blood work) drawn today and your tests are completely normal, you will receive your results only by: [place] (if you have MyChart) OR A paper copy in the mail If you have any lab test that is abnormal or we need to change your treatment, we will call you to review the results.   Testing/Procedures: Your physician has requested that you have an ankle brachial index (ABI). During this test an ultrasound and blood pressure cuff are used to evaluate the arteries that supply the arms and legs with blood. Allow thirty minutes for this exam. There are no restrictions or special instructions.  Your physician has requested that you have a lower extremity arterial exercise duplex. During this test, exercise and ultrasound are used to evaluate arterial blood flow in the legs. Allow one hour for this exam. There are no restrictions or special instructions.  Right lower extremity ultrasound to rule out DVT.   Follow-Up: At [facility], you and your health needs are our priority.  As part of our continuing mission to provide you with exceptional heart care, we have created designated Provider Care Teams.  These Care Teams include your primary Cardiologist (physician) and Advanced Practice Providers (APPs -  Physician Assistants and Nurse Practitioners) who all work together to provide you with the care you need, when you need it.   Your next appointment:   Follow up after testing   Provider:   Gerrie Nordmann, NP

## 2023-03-04 NOTE — Progress Notes (Signed)
Cardiology Office Note:   Date:  03/04/2023  ID:  [name] [name], DOB [birth year], MRN [id number]  History of Present Illness:   [name] [name] is a 82 y.o. 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  Previous heart catheterization 10/2015 revealed no evidence of coronary artery disease.  This was performed as part of workup prior to her MVR and maze procedure in 2016.  She was last seen in clinic by Jens Som, PA in 11/13/22 for her annual visit and device check. During her appointment she ws noted to be extremely emotional due to family issues. She also was found to have two fractures to her right pubis rami s/p fall where her special needs grand daughter bumped her and she fell. There was no surgery, she was able to do therapy and walk with a walker. She also complained of increased swelling on the right lower extremity.Device check required no changes and device had normal function.   She returns to clinic today stating that she continues to have an increased amount of swelling to her right lower extremity that had a raised red area that was painful and prevented her from wearing her compression stockings. She continues to walk with a walker with continued improvement in stability. She did sustain two fractures to the right hip s/p mechanical fall, but was treated medically since the fracture was nondisplaced. She continues to reside at [facility]. Denies any chest pain, palpitations, or shortness of breath. She does endorse  and ulcer to the third tow on the right foot. She recently was admitted to the hospital for two days and treated for community acquired pneumonia.   ROS: 10 point review of systems is negative with the exception of what has been mentioned in the HPI  Studies Reviewed:    EKG:  AV dual paced rhythm with a rate of 80  TTE 12/11/22 1. Left ventricular ejection fraction, by estimation, is 35 to 40%. The  left ventricle has moderately decreased function. The left ventricle  demonstrates global hypokinesis. There is mild left ventricular  hypertrophy. Left ventricular diastolic  parameters are indeterminate.   2. Right ventricular systolic function is mildly reduced. The right  ventricular size is severely enlarged.   3. Left atrial size was mild to moderately dilated.   4. Right atrial size was severely dilated.   5. The mitral valve has been repaired/replaced. No evidence of mitral  valve regurgitation. There is a 33 mm Edwards bioprosthetic valve present  in the mitral position. Procedure Date: 10/2015.   6. Tricuspid valve regurgitation is mild to moderate.   7. The aortic valve is tricuspid. Aortic valve regurgitation is not  visualized.   8. The inferior vena cava is dilated in size with <50% respiratory  variability, suggesting right atrial pressure of 15 mmHg.    Risk Assessment/Calculations:         CHA2DS2-VASc Score = 6   This indicates a 9.7% annual risk of stroke. The patient's score is based upon: CHF History: 1 HTN History: 0 Diabetes History: 0 Stroke History: 2 Vascular Disease History: 0 Age Score: 2  Gender Score: 1          Physical Exam:   VS:  BP 100/62 (BP Location: Left Arm, Patient Position: Sitting, Cuff Size: Normal)   Pulse 80   Ht 5\' 5"  (1.651 m)   Wt 147 lb 6 oz (66.8 kg)   SpO2 97%   BMI 24.52 kg/m    Wt Readings from Last 3 Encounters:  03/04/23 147 lb 6 oz (66.8 kg)  02/03/23 150 lb 12.8 oz (68.4 kg)  01/27/23 149 lb 14.6 oz (68 kg)      GEN: Well nourished, well developed in no acute distress NECK: No JVD; No carotid bruits CARDIAC: RRR, no murmurs, rubs, gallops RESPIRATORY:  Diminished bases to auscultation without rales, wheezing or rhonchi  ABDOMEN: Soft, non-tender, non-distended EXTREMITIES:  2+ pitting edema to the RLE and 1+ pitting edema to the LLE; No deformity ; small round ulcerated area noted to the third toe on the right foot, 1+ DT/PT pulses bilaterally  ASSESSMENT AND PLAN:   Paroxysmal atrial fibrillation with paced rhythm noted on EKG.  She is continued on apixaban 5 mg twice daily for CHA2DS2-VASc score 6.  She is also continued on Toprol-XL.  She is status post MAZE, and LAA clip.  She denies any signs of bleeding as well on the apixaban.  Nonischemic cardiomyopathy/HFrEF  with LVEF of 35-40% on recent echocardiogram completed in January 2024.  She previously did not tolerate Entresto.  She is continued on Toprol-XL, losartan, furosemide, and spironolactone.  She does require refill of her losartan today as well to the pharmacy of choice. Declined SGLT2i.  Hyperlipidemia with cholesterol that had been at goal.  She is continued on rosuvastatin 40 mg daily.  There were no changes made to her medication regimen today. Will need FLP ordered on return.  History of SVT with prior ablation completed.  She is continued on Toprol-XL.  Remained stable at this time.  Seizures very continue to be followed by neurology.  She sees Dr. Brigitte Pulse.  Seizures were exacerbated by anesthesia in the past.  She is continued on Lamictal and brivaracetam.  Peripheral edema that is asymmetric and has been for an extended period of time.  Patient has had fluctuations in swelling and has previously taken increased furosemide with improvement in swelling.  Unfortunately she stated that she had a red area that popped but knew the back of her knee that was painful and was unable she was unable to place her compression stockings.  Patient  had concern for possible DVT.  With change in symptoms she has been sent for an ultrasound of that leg to rule out DVT.  It is likely not a DVT due to her long term history on apixaban.  She does complain of discomfort and pain and there is a small ulceration noted to the third toe on the right foot.  She has been scheduled for ABIs to determine any circulation problems.  She currently takes her furosemide 2 days a week wears her compression stockings and elevates her extremities.  We did discuss about increasing her furosemide but she is concerned about her kidney function.  We did advise her to increase her furosemide to 3 times a week and will repeat a BMP in 2 weeks to reevaluate her kidney function.  Sick sinus syndrome s/p dual pacemaker. Continues to be followed by Dr Lovena Le.  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        Signed, Paeton Studer, NP

## 2023-03-05 ENCOUNTER — Telehealth: Payer: Self-pay | Admitting: Cardiovascular Disease

## 2023-03-05 NOTE — Telephone Encounter (Signed)
Patient would like a call back to discuss the exercise equipment she is to use on May 8th. Requesting call back to discuss further.

## 2023-03-05 NOTE — Telephone Encounter (Signed)
Attempted to call patient to discuss lower extremity arterial exercise duplex scheduled 04/09/23. Left message to call back

## 2023-03-06 NOTE — Telephone Encounter (Signed)
Spoke with patient and answered the question she had about the upcoming procedure. Patient wanted to know if the exercise test was to be treadmill or stationary bike. Patient was informed it is treadmill. Patient satisfied with response.

## 2023-03-06 NOTE — Telephone Encounter (Signed)
Pt returning call

## 2023-03-12 ENCOUNTER — Encounter: Payer: Self-pay | Admitting: Internal Medicine

## 2023-03-17 ENCOUNTER — Encounter: Payer: Self-pay | Admitting: Internal Medicine

## 2023-03-18 ENCOUNTER — Other Ambulatory Visit: Payer: Medicare Other

## 2023-03-18 ENCOUNTER — Other Ambulatory Visit
Admission: RE | Admit: 2023-03-18 | Discharge: 2023-03-18 | Disposition: A | Discharge status: Home / Self Care | Payer: Medicare Other | Source: Ambulatory Visit | Attending: Cardiology | Admitting: Cardiology

## 2023-03-18 ENCOUNTER — Ambulatory Visit (INDEPENDENT_AMBULATORY_CARE_PROVIDER_SITE_OTHER): Payer: Medicare Other

## 2023-03-18 DIAGNOSIS — L97919 Non-pressure chronic ulcer of unspecified part of right lower leg with unspecified severity: Secondary | ICD-10-CM

## 2023-03-18 DIAGNOSIS — J189 Pneumonia, unspecified organism: Secondary | ICD-10-CM

## 2023-03-18 DIAGNOSIS — I5022 Chronic systolic (congestive) heart failure: Secondary | ICD-10-CM | POA: Diagnosis present

## 2023-03-18 LAB — BASIC METABOLIC PANEL
Anion gap: 7 (ref 5–15)
BUN: 34 mg/dL — ABNORMAL HIGH (ref 8–23)
CO2: 28 mmol/L (ref 22–32)
Calcium: 9.5 mg/dL (ref 8.9–10.3)
Chloride: 105 mmol/L (ref 98–111)
Creatinine, Ser: 1.09 mg/dL — ABNORMAL HIGH (ref 0.44–1.00)
GFR, Estimated: 51 mL/min — ABNORMAL LOW (ref >60)
Glucose, Bld: 88 mg/dL (ref 70–99)
Potassium: 4 mmol/L (ref 3.5–5.1)
Sodium: 140 mmol/L (ref 135–145)

## 2023-03-21 ENCOUNTER — Encounter: Payer: Self-pay | Admitting: Internal Medicine

## 2023-03-23 ENCOUNTER — Telehealth: Payer: Self-pay | Admitting: *Deleted

## 2023-03-23 NOTE — Telephone Encounter (Signed)
$  0 due (have 2 insurances) for Prolia; PA not required. Mychart message sent to scheduled appt. Prolia Injection due on or after 04/16/23

## 2023-03-31 ENCOUNTER — Other Ambulatory Visit: Payer: Self-pay | Admitting: Cardiovascular Disease

## 2023-03-31 DIAGNOSIS — I5022 Chronic systolic (congestive) heart failure: Secondary | ICD-10-CM

## 2023-03-31 NOTE — Telephone Encounter (Signed)
last visit 03/04/23--Follow up after testing   next visit 04/11/23 (after requested studies)

## 2023-04-01 ENCOUNTER — Ambulatory Visit: Payer: Medicare Other | Admitting: Cardiovascular Disease

## 2023-04-03 ENCOUNTER — Inpatient Hospital Stay (HOSPITAL_BASED_OUTPATIENT_CLINIC_OR_DEPARTMENT_OTHER): Payer: Medicare Other | Admitting: Oncology

## 2023-04-03 ENCOUNTER — Encounter: Payer: Self-pay | Admitting: Oncology

## 2023-04-03 ENCOUNTER — Inpatient Hospital Stay: Payer: Medicare Other | Attending: Oncology

## 2023-04-03 ENCOUNTER — Other Ambulatory Visit: Payer: Self-pay

## 2023-04-03 VITALS — BP 84/59 | HR 70 | Temp 97.1°F | Resp 18 | Wt 152.5 lb

## 2023-04-03 DIAGNOSIS — D508 Other iron deficiency anemias: Secondary | ICD-10-CM

## 2023-04-03 DIAGNOSIS — D72820 Lymphocytosis (symptomatic): Secondary | ICD-10-CM

## 2023-04-03 DIAGNOSIS — N1831 Chronic kidney disease, stage 3a: Secondary | ICD-10-CM | POA: Diagnosis not present

## 2023-04-03 DIAGNOSIS — D696 Thrombocytopenia, unspecified: Secondary | ICD-10-CM

## 2023-04-03 DIAGNOSIS — D509 Iron deficiency anemia, unspecified: Secondary | ICD-10-CM | POA: Diagnosis present

## 2023-04-03 DIAGNOSIS — D631 Anemia in chronic kidney disease: Secondary | ICD-10-CM

## 2023-04-03 DIAGNOSIS — R7402 Elevation of levels of lactic acid dehydrogenase (LDH): Secondary | ICD-10-CM

## 2023-04-03 LAB — COMPREHENSIVE METABOLIC PANEL
ALT: 22 U/L (ref 0–44)
AST: 36 U/L (ref 15–41)
Albumin: 4 g/dL (ref 3.5–5.0)
Alkaline Phosphatase: 109 U/L (ref 38–126)
Anion gap: 7 (ref 5–15)
BUN: 31 mg/dL — ABNORMAL HIGH (ref 8–23)
CO2: 30 mmol/L (ref 22–32)
Calcium: 9.4 mg/dL (ref 8.9–10.3)
Chloride: 99 mmol/L (ref 98–111)
Creatinine, Ser: 1.22 mg/dL — ABNORMAL HIGH (ref 0.44–1.00)
GFR, Estimated: 44 mL/min — ABNORMAL LOW (ref >60)
Glucose, Bld: 81 mg/dL (ref 70–99)
Potassium: 4.1 mmol/L (ref 3.5–5.1)
Sodium: 136 mmol/L (ref 135–145)
Total Bilirubin: 0.6 mg/dL (ref 0.3–1.2)
Total Protein: 6.3 g/dL — ABNORMAL LOW (ref 6.5–8.1)

## 2023-04-03 LAB — CBC WITH DIFFERENTIAL (CANCER CENTER ONLY)
Abs Immature Granulocytes: 0.01 10*3/uL (ref 0.00–0.07)
Basophils Absolute: 0 10*3/uL (ref 0.0–0.1)
Basophils Relative: 1 %
Eosinophils Absolute: 0.1 10*3/uL (ref 0.0–0.5)
Eosinophils Relative: 3 %
HCT: 28 % — ABNORMAL LOW (ref 36.0–46.0)
Hemoglobin: 8.9 g/dL — ABNORMAL LOW (ref 12.0–15.0)
Immature Granulocytes: 0 %
Lymphocytes Relative: 21 %
Lymphs Abs: 0.8 10*3/uL (ref 0.7–4.0)
MCH: 29.6 pg (ref 26.0–34.0)
MCHC: 31.8 g/dL (ref 30.0–36.0)
MCV: 93 fL (ref 80.0–100.0)
Monocytes Absolute: 0.3 10*3/uL (ref 0.1–1.0)
Monocytes Relative: 7 %
Neutro Abs: 2.6 10*3/uL (ref 1.7–7.7)
Neutrophils Relative %: 68 %
Platelet Count: 111 10*3/uL — ABNORMAL LOW (ref 150–400)
RBC: 3.01 MIL/uL — ABNORMAL LOW (ref 3.87–5.11)
RDW: 15.4 % (ref 11.5–15.5)
WBC Count: 3.8 10*3/uL — ABNORMAL LOW (ref 4.0–10.5)
nRBC: 0 % (ref 0.0–0.2)

## 2023-04-03 LAB — IRON AND TIBC
Iron: 47 ug/dL (ref 28–170)
Saturation Ratios: 10 % — ABNORMAL LOW (ref 10.4–31.8)
TIBC: 462 ug/dL — ABNORMAL HIGH (ref 250–450)
UIBC: 415 ug/dL

## 2023-04-03 LAB — FERRITIN: Ferritin: 18 ng/mL (ref 11–307)

## 2023-04-03 LAB — LACTATE DEHYDROGENASE: LDH: 255 U/L — ABNORMAL HIGH (ref 98–192)

## 2023-04-03 NOTE — Assessment & Plan Note (Signed)
Platelet count is stable. Observation.  

## 2023-04-03 NOTE — Assessment & Plan Note (Signed)
She did not tolerate oral iron supplementation.  Discussed about IV Venofer treatments. I discussed about the potential risks including but not limited to allergic reactions/infusion reactions including anaphylactic reactions, phlebitis, high blood pressure, wheezing, SOB, skin rash, weight gain,dark urine, leg swelling, back pain, headache, nausea and fatigue, etc. Patient agrees with IV Venofer treatments.  Recommend IV Venofer weekly x 4.

## 2023-04-03 NOTE — Assessment & Plan Note (Addendum)
Recommend to improve iron store first.  Consider erythropoietin replacement therapy if hemoglobin is less than 10 despite improvement of iron store.

## 2023-04-03 NOTE — Assessment & Plan Note (Addendum)
No constitution symptoms.  Repeat lab is pending. Recommend observation.

## 2023-04-03 NOTE — Progress Notes (Signed)
Pt here for follow up. No new heme concerns voiced.

## 2023-04-03 NOTE — Progress Notes (Signed)
Hematology/Oncology Progress Note [facility] [phone] [phone]   ASSESSMENT & PLAN:   Monoclonal B-cell lymphocytosis of undetermined significance No constitution symptoms.  Repeat lab is pending. Recommend observation.   Thrombocytopenia (HCC) Platelet count is stable. Observation.   Anemia in chronic kidney disease Recommend to improve iron store first.  Consider erythropoietin replacement therapy if hemoglobin is less than 10 despite improvement of iron store.  IDA (iron deficiency anemia) She did not tolerate oral iron supplementation.  Discussed about IV Venofer treatments. I discussed about the potential risks including but not limited to allergic reactions/infusion reactions including anaphylactic reactions, phlebitis, high blood pressure, wheezing, SOB, skin rash, weight gain,dark urine, leg swelling, back pain, headache, nausea and fatigue, etc. Patient agrees with IV Venofer treatments.  Recommend IV Venofer weekly x 4.   Orders Placed This Encounter  Procedures   CBC with Differential (Cancer Center Only)    Standing Status:   Future    Number of Occurrences:   1    Standing Expiration Date:   04/02/2024   Iron and TIBC    Standing Status:   Future    Number of Occurrences:   1    Standing Expiration Date:   04/02/2024   Ferritin    Standing Status:   Future    Number of Occurrences:   1    Standing Expiration Date:   04/02/2024   CBC with Differential (Cancer Center Only)    Standing Status:   Future    Standing Expiration Date:   04/02/2024   CMP (Cancer Center only)    Standing Status:   Future    Standing Expiration Date:   04/02/2024   Lactate dehydrogenase    Standing Status:   Future    Standing Expiration Date:   04/02/2024   Haptoglobin    Standing Status:   Future    Standing Expiration Date:   04/02/2024   Iron and TIBC    Standing Status:   Future    Standing Expiration Date:   04/02/2024   Ferritin     Standing Status:   Future    Standing Expiration Date:   10/04/2023   Follow up in 4 months.  All questions were answered. The patient knows to call the clinic with any problems, questions or concerns.  Rickard Patience, MD, PhD [facility] Hematology Oncology 04/03/2023    CHIEF COMPLAINTS/REASON FOR VISIT:  Follow up for anemia and thrombocytopenia.   HISTORY OF PRESENTING ILLNESS:  [name] [name] is a  82 y.o.  female presents for follow up of thrombocytopenia, anemia, monoclonal lymphocytosis. . .  06/17/2021- 06/23/2021 patient was hospitalized due to left displaced femoral fracture and dislocation of at the fifth PIP. She was evaluated by orthopedic surgery and underwent ORIF on 06/18/2021.  Finger dislocation was also reduced in the OR. Patient has chronic anemia with hemoglobin around 11 before her admission.  Hemoglobin dropped to 7.4 dueing her hospitalization s/p PRBC transfusion. Hemoglobin improved 8 on 06/22/2021. At discharge, 06/23/2021 cbc showed hemoglobin 7.4, platelet count 110000  Patient reports feeling fatigue.   INTERVAL HISTORY [name] [name] is a 82 y.o. female presents for follow up of thrombocytopenia, anemia, monoclonal lymphocytosis. .  She reports ongoing poor sleep and neuropathy. Chronic back pain.  + frequent falls, lower extremity edema.  Her cardiology has ordered some additional workup. Appetite is good. Weight is stable.  Fatigue Denies hematochezia, hematuria, hematemesis, epistaxis, black tarry stool  Review of Systems  Constitutional:  Positive for fatigue. Negative for appetite change, chills and fever.  HENT:   Negative for hearing loss and voice change.   Eyes:  Negative for eye problems.  Respiratory:  Negative for chest tightness, cough and shortness of breath.   Cardiovascular:  Negative for chest pain.  Gastrointestinal:  Negative for abdominal distention, abdominal pain and blood in stool.  Endocrine: Negative for hot flashes.  Genitourinary:   Negative for difficulty urinating and frequency.   Musculoskeletal:  Negative for arthralgias.       Recent nondisplaced fractures involving the right superior and inferior pubic rami.  Skin:  Negative for itching and rash.  Neurological:  Negative for extremity weakness.  Hematological:  Negative for adenopathy.  Psychiatric/Behavioral:  Negative for confusion.     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 Ladona Ridgel   Cataract    Left eye   Chronic kidney disease (CKD) stage G3a/A2, moderately decreased glomerular filtration rate (GFR) between 45-59 mL/min/1.73 square meter and albuminuria creatinine ratio between 30-299 mg/g (HCC) 11/13/2021   Closed bilateral fracture of pubic rami (HCC) 08/28/2022   Diverticulosis    Focal seizures (HCC)    Heart murmur    Hematuria, gross 04/09/2018   Hemorrhoid    Hepatic cyst    innumerable   History of asbestos exposure    Hyperlipidemia    IBS (irritable bowel syndrome)    Idiopathic thrombocytopenic purpura (ITP) (HCC)    Migraine    MVP (mitral valve prolapse)    Near syncope 01/26/2023   Nodule of right lung    Osteoporosis of forearm    RBBB    Restrictive lung disease    Mild on PFT & likely cardiac in etiology    Right sided sciatica 04/10/2022   S/P Minimally invasive maze operation for atrial fibrillation 10/31/2015   Complete bilateral atrial lesion set using cryothermy and bipolar radiofrequency ablation with clipping of LA appendage via right mini thoracotomy approach   S/P minimally invasive mitral valve  replacement with bioprosthetic valve 10/31/2015   33 mm [facility] Mitral bovine bioprosthetic tissue valve placed via right mini thoracotomy approach   Seizures (HCC)    left foot paralysis and left hand paralysis Dr. 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   SVT (supraventricular tachycardia)    Thoracic aorta atherosclerosis (HCC)    TIA (transient ischemic attack)    Visit for preventive health examination 05/23/2016    SURGICAL HISTORY: Past Surgical History:  Procedure Laterality Date   BREAST EXCISIONAL BIOPSY Right Late 80s   Negative X2   CARDIAC CATHETERIZATION N/A 10/18/2015   Procedure: Right/Left Heart Cath and Coronary Angiography;  Surgeon: Tonny Bollman, MD;  Location: [facility] INVASIVE CV LAB;  Service: Cardiovascular;  Laterality: N/A;   CARDIOVERSION N/A 08/22/2015   Procedure: CARDIOVERSION;  Surgeon: Vesta Mixer, MD;  Location: [facility] ENDOSCOPY;  Service: Cardiovascular;  Laterality: N/A;   COLONOSCOPY  2003   EP IMPLANTABLE DEVICE N/A 11/06/2015   Procedure: Pacemaker Implant;  Surgeon: Marinus Maw, MD;  Location: [facility] INVASIVE CV LAB;  Service: Cardiovascular;  Laterality: N/A;   EP IMPLANTABLE DEVICE N/A 02/20/2016   Procedure: Lead Extraction;  Surgeon: Marinus Maw, MD;  Location: [facility] INVASIVE CV LAB;  Service: Cardiovascular;  Laterality: N/A;   EYE SURGERY Right 2013   FEMUR IM NAIL Left 06/18/2021   Procedure: INTRAMEDULLARY (IM) NAIL FEMORAL, OPEN REDUCTION INTERNAL FIXATION FEMUR RIGHT LITTLE FINGER PIP CLOSED REDUCTION;  Surgeon: Myrene Galas, MD;  Location: [facility] OR;  Service: Orthopedics;  Laterality: Left;   FOOT SURGERY     ~2007 right foot bunion   MANDIBLE FRACTURE SURGERY  03/26/2013   MINIMALLY INVASIVE MAZE PROCEDURE N/A 10/31/2015   Procedure: MINIMALLY INVASIVE MAZE PROCEDURE;  Surgeon: Purcell Nails, MD;  Location: [facility] OR;  Service: Open Heart Surgery;  Laterality: N/A;   MITRAL VALVE  REPLACEMENT Right 10/31/2015   Procedure: MINIMALLY INVASIVE MITRAL VALVE (MV) REPLACEMENT;  Surgeon: Purcell Nails, MD;  Location: [facility] OR;  Service: Open Heart Surgery;  Laterality: Right;   PACEMAKER LEAD REMOVAL  02/20/2016   TEE WITH CARDIOVERSION     TEE WITHOUT CARDIOVERSION N/A 08/22/2015   Procedure: TRANSESOPHAGEAL ECHOCARDIOGRAM (TEE);  Surgeon: Vesta Mixer, MD;  Location: [facility] ENDOSCOPY;  Service: Cardiovascular;  Laterality: N/A;   TEE WITHOUT CARDIOVERSION N/A 10/31/2015   Procedure: TRANSESOPHAGEAL ECHOCARDIOGRAM (TEE);  Surgeon: Purcell Nails, MD;  Location: [facility] OR;  Service: Open Heart Surgery;  Laterality: N/A;   TUBAL LIGATION     VARICOSE VEIN SURGERY Right     SOCIAL HISTORY: Social History   Socioeconomic History   Marital status: Widowed    Spouse name: Deceased   Number of children: 2   Years of education: 49   Highest education level: Master's degree (e.g., MA, MS, MEng, MEd, MSW, MBA)  Occupational History   Occupation: Retired  Tobacco Use   Smoking status: Never   Smokeless tobacco: Never  Vaping Use   Vaping Use: Never used  Substance and Sexual Activity   Alcohol use: No    Alcohol/week: 3.0 standard drinks of alcohol    Types: 3 Standard drinks or equivalent per week   Drug use: No   Sexual activity: Not Currently  Other Topics Concern   Not on file  Social History Narrative   ** Merged History Encounter **       She is a widow.  Husband died from metastatic renal cell cancer. Originally from [place]. Previously lived in [place] from 1975-2007. Moved to [place] in 2007. No mold exposure recently but did have it through a prior work exposure in 1993. Has a masters in    public health. No bird exposure.  [facility] (09/29/17): She has moved into a retirement community since last appointment. She reports she had testing at her new residence that was positive for mold. It has since been treated.    Social Determinants of Health    Financial Resource Strain: Low Risk  (03/17/2023)   Overall Financial Resource Strain (CARDIA)    Difficulty of Paying Living Expenses: Not hard at all  Food Insecurity: No Food Insecurity (03/17/2023)   Hunger Vital Sign    Worried About Running Out of Food in the Last Year: Never true    Ran Out of Food in the Last Year: Never true  Transportation Needs: No Transportation Needs (03/17/2023)   PRAPARE - Transportation  Lack of Transportation (Medical): No    Lack of Transportation (Non-Medical): No  Physical Activity: Unknown (03/17/2023)   Exercise Vital Sign    Days of Exercise per Week: Patient declined    Minutes of Exercise per Session: Not on file  Stress: No Stress Concern Present (03/17/2023)   [employer] of Occupational Health - Occupational Stress Questionnaire    Feeling of Stress : Only a little  Social Connections: Unknown (03/17/2023)   Social Connection and Isolation Panel [NHANES]    Frequency of Communication with Friends and Family: Patient declined    Frequency of Social Gatherings with Friends and Family: Once a week    Attends Religious Services: Patient declined    Database administrator or Organizations: Yes    Attends Banker Meetings: Patient declined    Marital Status: Widowed  Intimate Partner Violence: Not At Risk (01/27/2023)   Humiliation, Afraid, Rape, and Kick questionnaire    Fear of Current or Ex-Partner: No    Emotionally Abused: No    Physically Abused: No    Sexually Abused: No    FAMILY HISTORY: Family History  Problem Relation Age of Onset   Hypertension Mother    Arrhythmia Mother    Heart failure Mother    Arrhythmia Brother    Stroke Brother 77       cerebral hemorrhage, nonsmoker, no HTN   Prostate cancer Brother    Stroke Father        from an aneurysm   Stroke Maternal Aunt 83       cerebral hemorrhage   Liver cancer Maternal Grandmother    Atrial fibrillation Son    Celiac disease Son    Heart attack  Neg Hx    Breast cancer Neg Hx     ALLERGIES:  is allergic to codeine, hydrocodone, hydromorphone, molds & smuts, oxycodone, meloxicam, codeine, dextromethorphan, diphen [diphenhydramine hcl], diphenhydramine, diphenhydramine hcl, diphenylpyraline, doxycycline, doxycycline, hydrocodone, hydromorphone, meloxicam, mobic [meloxicam], nsaids, nsaids, oxycodone, propofol, and propofol.  MEDICATIONS:  Current Outpatient Medications  Medication Sig Dispense Refill   BRIVIACT 50 MG TABS Take 1 tablet by mouth 2 (two) times daily.     butalbital-acetaminophen-caffeine (FIORICET) 50-325-40 MG tablet TAKE 1 TABLET BY MOUTH ONCE DAILY AS NEEDED FOR BACK PAIN OR MIGRAINE MAX OF 3 TABS PER DAY 90 tablet 0   calcium carbonate (CALCIUM 600) 600 MG TABS tablet 600 mg 2 (two) times daily with a meal.     Cholecalciferol (VITAMIN D) 2000 units CAPS Take 2,000 Units by mouth daily.     ELIQUIS 5 MG TABS tablet TAKE ONE TABLET TWICE DAILY 180 tablet 2   fluticasone (FLONASE) 50 MCG/ACT nasal spray USE 2 SPRAYS IN BOTH NOSTRILS DAILY 16 g 11   folic acid (FOLVITE) 1 MG tablet Take 1 mg by mouth daily.     furosemide (LASIX) 40 MG tablet TAKE ONE (1) TABLET BY MOUTH TWICE PER WEEK; TAKE AN ADDITIONAL HALF (1/2) TO ONE (1) TABLET AS NEEDED FOR FLUID RETENTION 90 tablet 0   gabapentin (NEURONTIN) 100 MG capsule Take 200 mg by mouth 3 (three) times daily.     lamoTRIgine (LAMICTAL) 200 MG tablet Take 150 mg in the am & 200 mg in the pm.     losartan (COZAAR) 25 MG tablet Take 0.5 tablets (12.5 mg total) by mouth daily. 45 tablet 3   metaxalone (SKELAXIN) 800 MG tablet TAKE ONE TABLET BY MOUTH 3 TIMES DAILY (Patient taking differently: Take 800 mg  by mouth daily as needed for muscle spasms.) 90 tablet 1   metoprolol succinate (TOPROL-XL) 25 MG 24 hr tablet TAKE ONE TABLET (25 MG) BY MOUTH EVERY DAY 30 tablet 0   montelukast (SINGULAIR) 10 MG tablet TAKE ONE TABLET BY MOUTH AT BEDTIME 90 tablet 1   polyethylene glycol  powder (GLYCOLAX/MIRALAX) 17 GM/SCOOP powder Take 1 Dose by mouth daily.     rosuvastatin (CRESTOR) 40 MG tablet Take 1 tablet (40 mg total) by mouth daily. 90 tablet 3   spironolactone (ALDACTONE) 25 MG tablet Take 0.5 tablets (12.5 mg total) by mouth every other day. 45 tablet 0   VENTOLIN HFA 108 (90 Base) MCG/ACT inhaler INHALE 2 PUFFS INTO THE LUNGS EVERY 6 HOURS AS NEEDED FOR WHEEZING OR SHORTNESS OF BREATH 18 g 1   vitamin B-12 (CYANOCOBALAMIN) 1000 MCG tablet Take 1,000 mcg by mouth daily.     No current facility-administered medications for this visit.     PHYSICAL EXAMINATION: ECOG PERFORMANCE STATUS: 1 - Symptomatic but completely ambulatory Vitals:   04/03/23 1353  BP: (!) 84/59  Pulse: 70  Resp: 18  Temp: (!) 97.1 F (36.2 C)   Filed Weights   04/03/23 1353  Weight: 152 lb 8 oz (69.2 kg)    Physical Exam Constitutional:      General: She is not in acute distress.    Appearance: She is well-developed. She is not ill-appearing.     Comments: Frail appearing, she walks with a walker  HENT:     Head: Atraumatic.     Mouth/Throat:     Pharynx: No oropharyngeal exudate.  Eyes:     General: No scleral icterus. Cardiovascular:     Rate and Rhythm: Normal rate and regular rhythm.  Pulmonary:     Effort: Pulmonary effort is normal.     Breath sounds: Normal breath sounds.  Abdominal:     General: There is no distension.     Palpations: Abdomen is soft.     Tenderness: There is no abdominal tenderness.  Musculoskeletal:        General: No tenderness or deformity. Normal range of motion.     Cervical back: Normal range of motion and neck supple.  Skin:    General: Skin is warm and dry.  Neurological:     Mental Status: She is alert and oriented to person, place, and time. Mental status is at baseline.  Psychiatric:        Mood and Affect: Mood normal.     LABORATORY DATA:  I have reviewed the data as listed    Latest Ref Rng & Units 02/03/2023   11:35 AM  01/28/2023    4:36 AM 01/27/2023    4:12 AM  CBC  WBC 4.0 - 10.5 K/uL 4.4  4.0  5.7   Hemoglobin 12.0 - 15.0 g/dL 9.1  9.2  9.2   Hematocrit 36.0 - 46.0 % 27.5  29.0  29.0   Platelets 150.0 - 400.0 K/uL 117.0  111  110       Latest Ref Rng & Units 04/03/2023    1:35 PM 03/18/2023   11:57 AM 02/03/2023   11:36 AM  CMP  Glucose 70 - 99 mg/dL 81  88  77   BUN 8 - 23 mg/dL 31  34  23   Creatinine 0.44 - 1.00 mg/dL 4.09  8.11  9.14   Sodium 135 - 145 mmol/L 136  140  141   Potassium 3.5 - 5.1 mmol/L  4.1  4.0  3.9   Chloride 98 - 111 mmol/L 99  105  103   CO2 22 - 32 mmol/L 30  28  25    Calcium 8.9 - 10.3 mg/dL 9.4  9.5  9.4   Total Protein 6.5 - 8.1 g/dL 6.3   6.0   Total Bilirubin 0.3 - 1.2 mg/dL 0.6   0.5   Alkaline Phos 38 - 126 U/L 109     AST 15 - 41 U/L 36   23   ALT 0 - 44 U/L 22   16    Lab Results  Component Value Date   IRON 43 (L) 02/03/2023   TIBC 390 02/03/2023   FERRITIN 21 02/03/2023    RADIOGRAPHIC STUDIES: I have personally reviewed the radiological images as listed and agreed with the findings in the report. DG Chest 2 View  Result Date: 03/21/2023 CLINICAL DATA:  History of pneumonia. EXAM: CHEST - 2 VIEW COMPARISON:  Plain films of the chest 02/21/2016 01/26/2023 and 03/03/2023. CT chest 08/07/2017. FINDINGS: Chronic focus of scar in the right middle lobe is unchanged. The lungs are otherwise clear. There is cardiomegaly. Pacing device, prosthetic aortic valve and atrial appendage clip noted. Marked convex right thoracolumbar scoliosis. IMPRESSION: No acute disease. Electronically Signed   By: Drusilla Kanner M.D.   On: 03/21/2023 12:09

## 2023-04-04 ENCOUNTER — Ambulatory Visit: Payer: Medicare Other

## 2023-04-04 ENCOUNTER — Telehealth: Payer: Self-pay

## 2023-04-04 NOTE — Telephone Encounter (Signed)
Called and spoke to pt about MD recommendation. She verbalized understanding and would like a call to set up appts as she has other appts scheduled already.   IV venofer weekly x4 Move up Nov appt to be in approx 4 months (lab/MD-same day)

## 2023-04-04 NOTE — Telephone Encounter (Signed)
-----   Message from Rickard Patience, MD sent at 04/03/2023  7:07 PM EDT ----- Lab work showed iron deficient anemia. I recommend IV Venofer weekly x 4.  Discussed about this possibility during today's visit. Please arrange. Please move her follow-up with plan to 4 months.  Lab MD same-day.  Thank you

## 2023-04-05 ENCOUNTER — Encounter: Payer: Self-pay | Admitting: Internal Medicine

## 2023-04-05 LAB — HAPTOGLOBIN: Haptoglobin: 26 mg/dL — ABNORMAL LOW (ref 41–333)

## 2023-04-07 ENCOUNTER — Other Ambulatory Visit: Payer: Self-pay | Admitting: Internal Medicine

## 2023-04-07 DIAGNOSIS — N1831 Chronic kidney disease, stage 3a: Secondary | ICD-10-CM

## 2023-04-07 LAB — COMP PANEL: LEUKEMIA/LYMPHOMA: Immunophenotypic Profile: 5

## 2023-04-08 ENCOUNTER — Inpatient Hospital Stay: Payer: Medicare Other

## 2023-04-08 VITALS — BP 89/60 | HR 82 | Temp 97.0°F | Resp 17

## 2023-04-08 DIAGNOSIS — D509 Iron deficiency anemia, unspecified: Secondary | ICD-10-CM | POA: Diagnosis not present

## 2023-04-08 DIAGNOSIS — N1831 Chronic kidney disease, stage 3a: Secondary | ICD-10-CM

## 2023-04-08 MED ORDER — SODIUM CHLORIDE 0.9 % IV SOLN
200.0000 mg | Freq: Once | INTRAVENOUS | Status: AC
  Administered 2023-04-08: 200 mg via INTRAVENOUS
  Filled 2023-04-08: qty 200

## 2023-04-08 MED ORDER — SODIUM CHLORIDE 0.9 % IV SOLN
Freq: Once | INTRAVENOUS | Status: AC
  Filled 2023-04-08: qty 250

## 2023-04-08 NOTE — Progress Notes (Signed)
Remote pacemaker transmission.   

## 2023-04-09 ENCOUNTER — Ambulatory Visit: Payer: Medicare Other | Attending: Cardiovascular Disease

## 2023-04-09 DIAGNOSIS — L97919 Non-pressure chronic ulcer of unspecified part of right lower leg with unspecified severity: Secondary | ICD-10-CM | POA: Diagnosis not present

## 2023-04-09 DIAGNOSIS — L97918 Non-pressure chronic ulcer of unspecified part of right lower leg with other specified severity: Secondary | ICD-10-CM

## 2023-04-11 ENCOUNTER — Ambulatory Visit: Payer: Medicare Other | Admitting: Cardiology

## 2023-04-15 ENCOUNTER — Inpatient Hospital Stay: Payer: Medicare Other

## 2023-04-15 VITALS — BP 96/65 | HR 74 | Temp 96.9°F | Resp 18

## 2023-04-15 DIAGNOSIS — N1831 Chronic kidney disease, stage 3a: Secondary | ICD-10-CM

## 2023-04-15 DIAGNOSIS — D509 Iron deficiency anemia, unspecified: Secondary | ICD-10-CM | POA: Diagnosis not present

## 2023-04-15 MED ORDER — SODIUM CHLORIDE 0.9 % IV SOLN
200.0000 mg | Freq: Once | INTRAVENOUS | Status: AC
  Administered 2023-04-15: 200 mg via INTRAVENOUS
  Filled 2023-04-15: qty 200

## 2023-04-15 MED ORDER — SODIUM CHLORIDE 0.9% FLUSH
3.0000 mL | Freq: Once | INTRAVENOUS | Status: DC | PRN
  Filled 2023-04-15: qty 3

## 2023-04-15 MED ORDER — HEPARIN SOD (PORK) LOCK FLUSH 100 UNIT/ML IV SOLN
500.0000 [IU] | Freq: Once | INTRAVENOUS | Status: DC | PRN
  Filled 2023-04-15: qty 5

## 2023-04-15 MED ORDER — ALTEPLASE 2 MG IJ SOLR
2.0000 mg | Freq: Once | INTRAMUSCULAR | Status: DC | PRN
  Filled 2023-04-15: qty 2

## 2023-04-15 MED ORDER — SODIUM CHLORIDE 0.9 % IV SOLN
Freq: Once | INTRAVENOUS | Status: AC
  Filled 2023-04-15: qty 250

## 2023-04-15 MED ORDER — SODIUM CHLORIDE 0.9% FLUSH
10.0000 mL | Freq: Once | INTRAVENOUS | Status: DC | PRN
  Filled 2023-04-15: qty 10

## 2023-04-15 MED ORDER — HEPARIN SOD (PORK) LOCK FLUSH 100 UNIT/ML IV SOLN
250.0000 [IU] | Freq: Once | INTRAVENOUS | Status: DC | PRN
  Filled 2023-04-15: qty 5

## 2023-04-15 NOTE — Patient Instructions (Signed)

## 2023-04-17 ENCOUNTER — Other Ambulatory Visit (INDEPENDENT_AMBULATORY_CARE_PROVIDER_SITE_OTHER): Payer: Self-pay | Admitting: Vascular Surgery

## 2023-04-17 ENCOUNTER — Other Ambulatory Visit: Payer: Self-pay

## 2023-04-17 ENCOUNTER — Emergency Department: Payer: Medicare Other

## 2023-04-17 ENCOUNTER — Emergency Department
Admission: EM | Admit: 2023-04-17 | Discharge: 2023-04-17 | Disposition: A | Discharge status: Home / Self Care | Payer: Medicare Other | Attending: Emergency Medicine | Admitting: Emergency Medicine

## 2023-04-17 ENCOUNTER — Encounter: Payer: Self-pay | Admitting: Emergency Medicine

## 2023-04-17 ENCOUNTER — Ambulatory Visit: Payer: Medicare Other

## 2023-04-17 DIAGNOSIS — I6523 Occlusion and stenosis of bilateral carotid arteries: Secondary | ICD-10-CM

## 2023-04-17 DIAGNOSIS — S0990XA Unspecified injury of head, initial encounter: Secondary | ICD-10-CM | POA: Insufficient documentation

## 2023-04-17 DIAGNOSIS — W01198A Fall on same level from slipping, tripping and stumbling with subsequent striking against other object, initial encounter: Secondary | ICD-10-CM | POA: Insufficient documentation

## 2023-04-17 DIAGNOSIS — R42 Dizziness and giddiness: Secondary | ICD-10-CM | POA: Diagnosis not present

## 2023-04-17 DIAGNOSIS — I447 Left bundle-branch block, unspecified: Secondary | ICD-10-CM | POA: Insufficient documentation

## 2023-04-17 DIAGNOSIS — Z950 Presence of cardiac pacemaker: Secondary | ICD-10-CM | POA: Diagnosis not present

## 2023-04-17 DIAGNOSIS — S3282XD Multiple fractures of pelvis without disruption of pelvic ring, subsequent encounter for fracture with routine healing: Secondary | ICD-10-CM | POA: Diagnosis not present

## 2023-04-17 DIAGNOSIS — Z7901 Long term (current) use of anticoagulants: Secondary | ICD-10-CM | POA: Diagnosis not present

## 2023-04-17 DIAGNOSIS — W19XXXA Unspecified fall, initial encounter: Secondary | ICD-10-CM

## 2023-04-17 LAB — CBC WITH DIFFERENTIAL/PLATELET
Abs Immature Granulocytes: 0.01 10*3/uL (ref 0.00–0.07)
Basophils Absolute: 0 10*3/uL (ref 0.0–0.1)
Basophils Relative: 1 %
Eosinophils Absolute: 0.1 10*3/uL (ref 0.0–0.5)
Eosinophils Relative: 3 %
HCT: 28.6 % — ABNORMAL LOW (ref 36.0–46.0)
Hemoglobin: 9 g/dL — ABNORMAL LOW (ref 12.0–15.0)
Immature Granulocytes: 0 %
Lymphocytes Relative: 22 %
Lymphs Abs: 0.8 10*3/uL (ref 0.7–4.0)
MCH: 29.9 pg (ref 26.0–34.0)
MCHC: 31.5 g/dL (ref 30.0–36.0)
MCV: 95 fL (ref 80.0–100.0)
Monocytes Absolute: 0.3 10*3/uL (ref 0.1–1.0)
Monocytes Relative: 8 %
Neutro Abs: 2.2 10*3/uL (ref 1.7–7.7)
Neutrophils Relative %: 66 %
Platelets: 91 10*3/uL — ABNORMAL LOW (ref 150–400)
RBC: 3.01 MIL/uL — ABNORMAL LOW (ref 3.87–5.11)
RDW: 16.8 % — ABNORMAL HIGH (ref 11.5–15.5)
Smear Review: NORMAL
WBC: 3.4 10*3/uL — ABNORMAL LOW (ref 4.0–10.5)
nRBC: 0 % (ref 0.0–0.2)

## 2023-04-17 LAB — COMPREHENSIVE METABOLIC PANEL
ALT: 23 U/L (ref 0–44)
AST: 40 U/L (ref 15–41)
Albumin: 4.1 g/dL (ref 3.5–5.0)
Alkaline Phosphatase: 97 U/L (ref 38–126)
Anion gap: 9 (ref 5–15)
BUN: 32 mg/dL — ABNORMAL HIGH (ref 8–23)
CO2: 24 mmol/L (ref 22–32)
Calcium: 9.3 mg/dL (ref 8.9–10.3)
Chloride: 104 mmol/L (ref 98–111)
Creatinine, Ser: 1.06 mg/dL — ABNORMAL HIGH (ref 0.44–1.00)
GFR, Estimated: 52 mL/min — ABNORMAL LOW (ref >60)
Glucose, Bld: 110 mg/dL — ABNORMAL HIGH (ref 70–99)
Potassium: 4 mmol/L (ref 3.5–5.1)
Sodium: 137 mmol/L (ref 135–145)
Total Bilirubin: 0.8 mg/dL (ref 0.3–1.2)
Total Protein: 6.3 g/dL — ABNORMAL LOW (ref 6.5–8.1)

## 2023-04-17 LAB — TROPONIN I (HIGH SENSITIVITY)
Troponin I (High Sensitivity): 11 ng/L (ref <18)
Troponin I (High Sensitivity): 12 ng/L (ref <18)

## 2023-04-17 NOTE — ED Provider Notes (Addendum)
[facility] Provider Note    Event Date/Time   First MD Initiated Contact with Patient 04/17/23 [phone]     (approximate)   History   Fall   HPI  [name] [name] is a 82 y.o. female with history of focal seizures, paroxysmal A-fib on Eliquis who comes in with concerns for a fall.  Patient reports mechanical fall in which she was trying to carry too much with her walker and got tripped up.  She did hit her head.  She states that she took a little bit longer than normal to try to get up.  Denies any LOC or any symptoms prior.  However she went to bed and then tried to get up a few hours later to go to the bathroom and after using the bathroom she stated that it was more difficult to get up and that she was concerned she could have broken her pelvis and that she was feeling more dizzy than normal.  She does report having a pacemaker.  She reports a little bit of pelvic discomfort but denies any severe new pain.  Denies any chest pain, shortness of breath.  Physical Exam   Triage Vital Signs: ED Triage Vitals  Enc Vitals Group     BP 04/17/23 0453 (!) 118/90     Pulse Rate 04/17/23 0453 72     Resp 04/17/23 0453 18     Temp 04/17/23 0453 97.7 F (36.5 C)     Temp Source 04/17/23 0453 Oral     SpO2 04/17/23 0453 99 %     Weight 04/17/23 0453 152 lb 7.2 oz (69.2 kg)     Height 04/17/23 0453 5\' 5"  (1.651 m)     Head Circumference --      Peak Flow --      Pain Score 04/17/23 0452 4     Pain Loc --      Pain Edu? --      Excl. in GC? --     Most recent vital signs: Vitals:   04/17/23 0453  BP: (!) 118/90  Pulse: 72  Resp: 18  Temp: 97.7 F (36.5 C)  SpO2: 99%     General: Awake, no distress.  CV:  Good peripheral perfusion.  Resp:  Normal effort.  Abd:  No distention.  Other:  Patient able to lift both legs up off the bed.  Full range of motion of arms.   Finger-nose intact.  Cranial nerves appear intact.  Equal strength in arms and legs.   Sensation intact.  ED Results / Procedures / Treatments   Labs (all labs ordered are listed, but only abnormal results are displayed) Labs Reviewed  CBC WITH DIFFERENTIAL/PLATELET  COMPREHENSIVE METABOLIC PANEL  URINALYSIS, ROUTINE W REFLEX MICROSCOPIC  TROPONIN I (HIGH SENSITIVITY)     EKG  My interpretation of EKG: Widened QRS with a rate of 72 without any obvious ST elevation, T wave inversions in aVL and V2, left bundle branch block.  Similar EKG to prior   RADIOLOGY I have reviewed the CT had personally interpreted no evidence of intracranial hemorrhage  PROCEDURES:  Critical Care performed: No  Procedures   MEDICATIONS ORDERED IN ED: Medications - No data to display   IMPRESSION / MDM / ASSESSMENT AND PLAN / ED COURSE  I reviewed the triage vital signs and the nursing notes.   Patient's presentation is most consistent with acute presentation with potential threat to life or bodily function.   Patient  comes in with concerns for fall hitting her head on blood thinner but also reports some dizziness afterwards will get labs, EKG, interrogate her pacemaker and get CT imaging to evaluate for intracranial hemorrhage, cervical fracture, pelvis fracture.  Discussed with her that I do not feel she has a hip fracture given she is able to lift both legs up off the bed but there was a pelvis fracture these are typically nonoperative she would like to proceed with CT imaging.  Patient's dizziness seem to be more with movement I suspect may be a mild concussion.  She denies any significant dizziness laying in bed so I have low suspicion for acute stroke and she is already on Eliquis.  Troponin was initially negative.  CMP reassuring.  CBC still pending.  Patient was refusing urinary test denying any urinary symptoms  PM was interpretted without any issues and functioning normally.   CT imaging was negative for any acute new bony pelvis fracture but there are multiple chronic  ones.  CT head and neck were negative  7:00 AM patient denies any dizziness at this time.  Patient be handed off to oncoming team pending repeat troponin, ambulation trial and suspect will be able to be discharged back to facility.    FINAL CLINICAL IMPRESSION(S) / ED DIAGNOSES   Final diagnoses:  Fall, initial encounter  Injury of head, initial encounter     Rx / DC Orders   ED Discharge Orders     None        Note:  This document was prepared using Dragon voice recognition software and may include unintentional dictation errors.   Concha Se, MD 04/17/23 1610    Concha Se, MD 04/17/23 0700    Concha Se, MD 04/19/23 1115

## 2023-04-17 NOTE — ED Notes (Signed)
Pt able to ambulate with walker independently and has steady gait. Pt does c/o feeling "just a little bit dizzy" but is not ataxic. Pt says she feels safe walking.

## 2023-04-17 NOTE — ED Notes (Signed)
Pt to CT

## 2023-04-17 NOTE — ED Triage Notes (Addendum)
Pt presents a from [facility] via [facility] with complaints of neck pain following a fall last night between [phone]. Pt was walking with her walker and had a pile of clothing on top making it too heavy to maneuver and she had a ground level fall. She hit her head on the ground had no LOC, pt takes eliquis daily. A&Ox4 at this time. Denies dizziness, syncope, vision changes, CP or SOB.

## 2023-04-17 NOTE — ED Notes (Signed)
This RN offered to assist the patient to restroom and the patient declined stating, "I don't have a UTI, nor do I want to provide you a sample."Pt was educated on correlating her sx with lab work for more clarity on tonight's fall and dizziness. Pt still refuses - EDP notified.

## 2023-04-17 NOTE — ED Notes (Signed)
This RN contacted by [employer] technician who informed this RN that the patients device was last interrogated in December of 2023 and there have been no arrhythmias since that time. Dr. Fuller Plan notified of the information and a fax of the report will be sent to the ED.

## 2023-04-17 NOTE — ED Provider Notes (Signed)
-----------------------------------------   8:18 AM on 04/17/2023 ----------------------------------------- Patient's repeat troponin remains reassuring.  Patient was able to ambulate with a walker.  Patient denies any dizziness currently.  I did ask for a urine sample, patient denies any urinary symptoms and states she is ready to go home states if she has any urinary symptoms she will follow-up with her doctor.  Patient states she is currently enrolled in physical therapy to help increase her strength with ambulation and follows up with Dr. Sherryll Burger.  Given the patient's overall reassuring workup in the emergency department reassuring imaging, and establish outpatient follow-up I believe the patient is safe for discharge home.  Patient agreeable and states she is ready go home and go to sleep.   Minna Antis, MD 04/17/23 [phone]

## 2023-04-17 NOTE — ED Notes (Signed)
[employer] pacemaker interrogated at th bedside.

## 2023-04-18 ENCOUNTER — Telehealth: Payer: Self-pay

## 2023-04-18 NOTE — Telephone Encounter (Signed)
Pt is aware and gave a verbal understanding.  

## 2023-04-18 NOTE — Telephone Encounter (Signed)
Patient returned call from [place], likely a transition of care call. Would like a call back whenever possible, please advise [phone] thank you

## 2023-04-18 NOTE — Transitions of Care (Post Inpatient/ED Visit) (Signed)
I spoke with  pt; pt is feeling much better today with no pain at all. pt hit head on carpeted floor and did not lose consciousness when she got her foot caught on something and lost her balance and fell in [facility] independent living home. Pt does live alone but has a button to push if needs assistance or help.. pt is to have ED FU in 2 days. I did warm transfer to [place] at [facility] to schedule appt. UC & ED precautions given and pt voiced understanding. Sending note to DR Darrick Huntsman.     04/18/2023  Name: MARQUI DROSS MRN: [id number] DOB: September 07, 1941  Today's TOC FU Call Status: Today's TOC FU Call Status:: Unsuccessul Call (1st Attempt) Unsuccessful Call (1st Attempt) Date: 04/18/23 [facility] FU Call Complete Date: 04/18/23  Transition Care Management Follow-up Telephone Call Date of Discharge: 04/17/23 Discharge Facility: [facility]) Type of Discharge: Emergency Department Reason for ED Visit: Other: (fall; pt hit her head on carpeted floor' ? concussion) How have you been since you were released from the hospital?: Better (pt is feeling much better today with no pain at all. pt hit head on carpet floor and did not lose consciousness.) Any questions or concerns?: No  Items Reviewed: Did you receive and understand the discharge instructions provided?: Yes Medications obtained,verified, and reconciled?: Yes (Medications Reviewed) (pt is familiar with taking all meds.) Any new allergies since your discharge?: No Dietary orders reviewed?: NA Do you have support at home?: Yes People in Home: alone Name of Support/Comfort Primary Source: pt lives at [facility] in independent living; pt wears button to push if assistance needed.  Medications Reviewed Today: Medications Reviewed Today     Reviewed by Coralee Rud, RN (Registered Nurse) on 04/03/23 at 1347  Med List Status: <None>   Medication Order Taking? Sig Documenting Provider Last Dose Status  Informant  BRIVIACT 50 MG TABS [id number]  Take 1 tablet by mouth 2 (two) times daily. [provider]  Active Self  butalbital-acetaminophen-caffeine (FIORICET) 50-325-40 MG tablet [id number]  TAKE 1 TABLET BY MOUTH ONCE DAILY AS NEEDED FOR BACK PAIN OR MIGRAINE MAX OF 3 TABS PER DAY Sherlene Shams, MD  Active Self  calcium carbonate (CALCIUM 600) 600 MG TABS tablet [id number]  600 mg 2 (two) times daily with a meal. [provider]  Active Self  Cholecalciferol (VITAMIN D) 2000 units CAPS [id number]  Take 2,000 Units by mouth daily. [provider]  Active Self  ELIQUIS 5 MG TABS tablet [id number]  TAKE ONE TABLET TWICE DAILY Mariah Milling, Tollie Pizza, MD  Active Self  fluticasone (FLONASE) 50 MCG/ACT nasal spray [id number]  USE 2 SPRAYS IN BOTH NOSTRILS DAILY Sherlene Shams, MD  Active Self  folic acid (FOLVITE) 1 MG tablet [id number]  Take 1 mg by mouth daily. [provider]  Active Self  furosemide (LASIX) 40 MG tablet [id number]  TAKE ONE (1) TABLET BY MOUTH TWICE PER WEEK; TAKE AN ADDITIONAL HALF (1/2) TO ONE (1) TABLET AS NEEDED FOR FLUID RETENTION Hammock, Sheri, NP  Active   gabapentin (NEURONTIN) 100 MG capsule [id number]  Take 200 mg by mouth 3 (three) times daily. [provider]  Active Self  lamoTRIgine (LAMICTAL) 200 MG tablet [id number]  Take 150 mg in the am & 200 mg in the pm. [provider]  Active Self  losartan (COZAAR) 25 MG tablet [id number]  Take 0.5 tablets (12.5 mg total) by mouth daily. Antonieta Iba,  MD  Active   metaxalone (SKELAXIN) 800 MG tablet [id number]  TAKE ONE TABLET BY MOUTH 3 TIMES DAILY  Patient taking differently: Take 800 mg by mouth daily as needed for muscle spasms.   Sherlene Shams, MD  Active Self  metoprolol succinate (TOPROL-XL) 25 MG 24 hr tablet [id number]  TAKE ONE TABLET (25 MG) BY MOUTH EVERY DAY Mariah Milling, Tollie Pizza, MD  Active   montelukast (SINGULAIR) 10 MG tablet [id number]  TAKE ONE TABLET BY MOUTH AT  BEDTIME Sherlene Shams, MD  Active Self  polyethylene glycol powder (GLYCOLAX/MIRALAX) 17 GM/SCOOP powder [id number]  Take 1 Dose by mouth daily. [provider]  Active Self  rosuvastatin (CRESTOR) 40 MG tablet [id number]  Take 1 tablet (40 mg total) by mouth daily. Sherlene Shams, MD  Active   spironolactone (ALDACTONE) 25 MG tablet [id number]  Take 0.5 tablets (12.5 mg total) by mouth every other day. Antonieta Iba, MD  Active Self  VENTOLIN HFA 108 (90 Base) MCG/ACT inhaler [id number]  INHALE 2 PUFFS INTO THE LUNGS EVERY 6 HOURS AS NEEDED FOR WHEEZING OR SHORTNESS OF BREATH Sherlene Shams, MD  Active Self  vitamin B-12 (CYANOCOBALAMIN) 1000 MCG tablet [id number]  Take 1,000 mcg by mouth daily. [provider]  Active Self            Home Care and Equipment/Supplies: Were Home Health Services Ordered?: NA Any new equipment or medical supplies ordered?: NA  Functional Questionnaire: Do you need assistance with bathing/showering or dressing?: No Do you need assistance with meal preparation?: No Do you need assistance with eating?: No Do you have difficulty maintaining continence: No Do you need assistance with getting out of bed/getting out of a chair/moving?: No Do you have difficulty managing or taking your medications?: No  Follow up appointments reviewed: PCP Follow-up appointment confirmed?: No (i did warm transfer to [facility] and spoke with Cala Bradford who will schedule pt for ED FU.) MD Provider Line Number:[phone] Given: Yes Specialist Hospital Follow-up appointment confirmed?: NA Do you need transportation to your follow-up appointment?: No Do you understand care options if your condition(s) worsen?: Yes-patient verbalized understanding    SIGNATURE Lewanda Rife, LPN

## 2023-04-18 NOTE — Transitions of Care (Post Inpatient/ED Visit) (Signed)
Unable to reach pt by phone and left v/m requesting pt to call [phone].     04/18/2023  Name: KIMORE BLASH MRN: [id number] DOB: 02/24/1941  Today's TOC FU Call Status: Today's TOC FU Call Status:: Unsuccessul Call (1st Attempt) Unsuccessful Call (1st Attempt) Date: 04/18/23  Attempted to reach the patient regarding the most recent Inpatient/ED visit.  Follow Up Plan: Additional outreach attempts will be made to reach the patient to complete the Transitions of Care (Post Inpatient/ED visit) call.   Signature Lewanda Rife, LPN

## 2023-04-18 NOTE — Telephone Encounter (Signed)
No appts available next week with you. Do you want me to add her to Tuesday morning or schedule her with a different provider?

## 2023-04-18 NOTE — Telephone Encounter (Signed)
Rena called from [employer] at [place] to state patient was discharged from the ED yesterday and they recommended a two-day follow-up.  Patient states she went to the ED as a precaution.  Patient states she thinks other than meeting the ED requirements, she believes an appointment with Dr. Duncan Dull would waste Dr. Melina Schools time and the patient's time.  Patient states she has a physical therapy appointment at 1pm today and it usually lasts one hour.  Patient states this is not related to her most recent visit to the ED.  Patient states she had weakness in her legs while sitting on the toilet, so she pulled the cord.  Patient states she was able to get up and flush the toilet, but she went to the ED as a precaution.  Patient states she has not felt that weakness since.  Patient states she thinks they took care of everything at the ED.  Patient states the ED did the troponin again.  Patient states she isn't sure if she needs to be seen by Dr. Darrick Huntsman for the weakness.

## 2023-04-19 ENCOUNTER — Emergency Department
Admission: EM | Admit: 2023-04-19 | Discharge: 2023-04-19 | Disposition: A | Discharge status: Home / Self Care | Payer: Medicare Other | Attending: Emergency Medicine | Admitting: Emergency Medicine

## 2023-04-19 ENCOUNTER — Emergency Department: Payer: Medicare Other

## 2023-04-19 ENCOUNTER — Other Ambulatory Visit: Payer: Self-pay

## 2023-04-19 ENCOUNTER — Encounter: Payer: Self-pay | Admitting: Emergency Medicine

## 2023-04-19 DIAGNOSIS — R531 Weakness: Secondary | ICD-10-CM | POA: Diagnosis present

## 2023-04-19 DIAGNOSIS — D649 Anemia, unspecified: Secondary | ICD-10-CM | POA: Diagnosis not present

## 2023-04-19 LAB — CBC
HCT: 30.1 % — ABNORMAL LOW (ref 36.0–46.0)
Hemoglobin: 9.3 g/dL — ABNORMAL LOW (ref 12.0–15.0)
MCH: 29 pg (ref 26.0–34.0)
MCHC: 30.9 g/dL (ref 30.0–36.0)
MCV: 93.8 fL (ref 80.0–100.0)
Platelets: 106 10*3/uL — ABNORMAL LOW (ref 150–400)
RBC: 3.21 MIL/uL — ABNORMAL LOW (ref 3.87–5.11)
RDW: 17.2 % — ABNORMAL HIGH (ref 11.5–15.5)
WBC: 4.9 10*3/uL (ref 4.0–10.5)
nRBC: 0 % (ref 0.0–0.2)

## 2023-04-19 LAB — URINALYSIS, ROUTINE W REFLEX MICROSCOPIC
Bacteria, UA: NONE SEEN
Bilirubin Urine: NEGATIVE
Glucose, UA: NEGATIVE mg/dL
Hgb urine dipstick: NEGATIVE
Ketones, ur: NEGATIVE mg/dL
Nitrite: NEGATIVE
Protein, ur: NEGATIVE mg/dL
Specific Gravity, Urine: 1.006 (ref 1.005–1.030)
Squamous Epithelial / HPF: NONE SEEN /HPF (ref 0–5)
pH: 6 (ref 5.0–8.0)

## 2023-04-19 LAB — TROPONIN I (HIGH SENSITIVITY)
Troponin I (High Sensitivity): 13 ng/L (ref <18)
Troponin I (High Sensitivity): 13 ng/L (ref <18)

## 2023-04-19 LAB — BASIC METABOLIC PANEL
Anion gap: 9 (ref 5–15)
BUN: 26 mg/dL — ABNORMAL HIGH (ref 8–23)
CO2: 27 mmol/L (ref 22–32)
Calcium: 9.4 mg/dL (ref 8.9–10.3)
Chloride: 101 mmol/L (ref 98–111)
Creatinine, Ser: 1.12 mg/dL — ABNORMAL HIGH (ref 0.44–1.00)
GFR, Estimated: 49 mL/min — ABNORMAL LOW (ref >60)
Glucose, Bld: 98 mg/dL (ref 70–99)
Potassium: 3.7 mmol/L (ref 3.5–5.1)
Sodium: 137 mmol/L (ref 135–145)

## 2023-04-19 MED ORDER — IOHEXOL 350 MG/ML SOLN
100.0000 mL | Freq: Once | INTRAVENOUS | Status: AC | PRN
  Administered 2023-04-19: 75 mL via INTRAVENOUS

## 2023-04-19 NOTE — ED Triage Notes (Addendum)
Pt in via [facility] from [facility]; reports sudden onset generalized weakness, being unable to stand due to the weakness.  States she was up, doing normal morning routine, when she went to get up from the table, was unable to do so.  Last known well 1000.  Denies any other complaints.  Patient A/Ox4, NAD noted at this time.

## 2023-04-19 NOTE — ED Provider Notes (Signed)
[facility] Provider Note    Event Date/Time   First MD Initiated Contact with Patient 04/19/23 1323     (approximate)   History   Weakness   HPI  [name] [name] is a 82 y.o. female with history of focal seizures, paroxysmal A-fib presenting to the emergency department for evaluation of weakness.  Patient reports that around 10 AM today she went to stand up when she felt generally weak.  She had her walker and reports that her arms were strong so she was able to keep yourself up and prevent herself from falling.  Denies head injury.  She is unsure if she is still weak as she has not yet tried to get up.  Denies history of similar, but was seen for a fall in our ER 2 days ago and has documentation in her chart about leg weakness and a telephone note from yesterday.     Physical Exam   Triage Vital Signs: ED Triage Vitals  Enc Vitals Group     BP 04/19/23 1328 118/77     Pulse Rate 04/19/23 1328 78     Resp 04/19/23 1328 16     Temp 04/19/23 1328 98.1 F (36.7 C)     Temp Source 04/19/23 1328 Oral     SpO2 04/19/23 1328 97 %     Weight 04/19/23 1308 145 lb (65.8 kg)     Height 04/19/23 1308 5\' 5"  (1.651 m)     Head Circumference --      Peak Flow --      Pain Score 04/19/23 1308 0     Pain Loc --      Pain Edu? --      Excl. in GC? --     Most recent vital signs: Vitals:   04/19/23 1400 04/19/23 1500  BP: 116/75 116/80  Pulse: 76 76  Resp: 15 20  Temp:    SpO2: 92% 100%     General: Awake, interactive  CV:  Regular rate, good peripheral perfusion.  Resp:  Lungs clear, unlabored respirations.  Abd:  Soft, nondistended.  Neuro:  Keenly aware, following commands, 5 out of 5 strength in bilateral upper and lower extremities, visual fields grossly intact, normal facial symmetry, no limb drift or appreciable ataxia, normal sensation, no aphasia or dysarthria  ED Results / Procedures / Treatments   Labs (all labs ordered are listed, but  only abnormal results are displayed) Labs Reviewed  BASIC METABOLIC PANEL - Abnormal; Notable for the following components:      Result Value   BUN 26 (*)    Creatinine, Ser 1.12 (*)    GFR, Estimated 49 (*)    All other components within normal limits  CBC - Abnormal; Notable for the following components:   RBC 3.21 (*)    Hemoglobin 9.3 (*)    HCT 30.1 (*)    RDW 17.2 (*)    Platelets 106 (*)    All other components within normal limits  URINALYSIS, ROUTINE W REFLEX MICROSCOPIC - Abnormal; Notable for the following components:   Color, Urine STRAW (*)    APPearance CLEAR (*)    Leukocytes,Ua SMALL (*)    All other components within normal limits  CBG MONITORING, ED  TROPONIN I (HIGH SENSITIVITY)  TROPONIN I (HIGH SENSITIVITY)     EKG EKG independently reviewed interpreted by myself (ER attending) demonstrates:  EKG demonstrates wide-complex rhythm at a rate of 73 similar to prior in system  without concordant or significantly discordant ST changes.  PR 148, QTc 443.  RADIOLOGY Imaging independently reviewed and interpreted by myself demonstrates:  CTA pending  PROCEDURES:  Critical Care performed: No  Procedures   MEDICATIONS ORDERED IN ED: Medications  iohexol (OMNIPAQUE) 350 MG/ML injection 100 mL (75 mLs Intravenous Contrast Given 04/19/23 1520)     IMPRESSION / MDM / ASSESSMENT AND PLAN / ED COURSE  I reviewed the triage vital signs and the nursing notes.  Differential diagnosis includes, but is not limited to, anemia, electrolyte abnormality, lower suspicion for acute CVA in the absence of any appreciable focal neurologic deficits on exam, seizure with postictal period, arrhythmia  Patient's presentation is most consistent with acute presentation with potential threat to life or bodily function.  82 year old female presenting with generalized weakness with reported onset this morning.  Patient presented within the thrombolytic window, but did not have any  appreciable focal deficits on exam, so a code stroke was not activated.  Will obtain labs, CTA head and neck to further evaluate.    Lab work with stable anemia, electrolytes without significant derangement compared to prior.  UA without evidence of infection.  Initial troponin negative.  Signed out to oncoming provider pending repeat troponin, CTA head and neck, and disposition.  FINAL CLINICAL IMPRESSION(S) / ED DIAGNOSES   Final diagnoses:  Generalized weakness     Rx / DC Orders   ED Discharge Orders     None        Note:  This document was prepared using Dragon voice recognition software and may include unintentional dictation errors.   Trinna Post, MD 04/19/23 [phone]

## 2023-04-19 NOTE — Discharge Instructions (Addendum)
Your labs and CT scan today were all okay.  Please call your doctor on Monday to schedule a follow-up appointment to continue monitoring your symptoms.

## 2023-04-19 NOTE — ED Notes (Signed)
Patient states she will uber back to [facility]- states she has taken many ubers before. This RN called security at [facility] to make them aware of patient coming due to patient needing to be let in to apartment.

## 2023-04-19 NOTE — ED Notes (Signed)
Purewick placed at this time due to patient being unable to walk or move easily.

## 2023-04-19 NOTE — ED Triage Notes (Signed)
First Nurse Note;  Pt via EMS from [facility]. Pt c/o generalized weakness "from the neck down" States she was standing, and suddenly felt weak. Per EMS, pt had an unsteady gait. Equal bilateral grip strength. Pt did recently had a fall, no recent injuries since then. Pt is A&Ox4 and NAD 116/77 BP  100 HR  93% on RA  98.7 oral  111 CBG

## 2023-04-19 NOTE — ED Provider Notes (Signed)
Procedures     ----------------------------------------- 5:50 PM on 04/19/2023 ----------------------------------------- Labs including serial troponin and CT angiogram negative for acute findings.  Patient has remained asymptomatic in the ED, no symptoms with standing.  Orthostatic vital signs were normal.  Stable for discharge.     Sharman Cheek, MD 04/19/23 1750

## 2023-04-19 NOTE — ED Notes (Signed)
EDP, Ray at bedside; states no need for Code Stroke at this time.

## 2023-04-21 ENCOUNTER — Telehealth: Payer: Self-pay

## 2023-04-21 ENCOUNTER — Telehealth: Payer: Self-pay | Admitting: Internal Medicine

## 2023-04-21 ENCOUNTER — Ambulatory Visit: Payer: Medicare Other

## 2023-04-21 NOTE — Transitions of Care (Post Inpatient/ED Visit) (Signed)
Unable to reach pt by phone and left v/m requesting pt to cb [phone].       04/21/2023  Name: [name] [name] MRN: [id number] DOB: [birth year]  Today's TOC FU Call Status: Today's TOC FU Call Status:: Unsuccessul Call (1st Attempt) Unsuccessful Call (1st Attempt) Date: 04/21/23  Attempted to reach the patient regarding the most recent Inpatient/ED visit.  Follow Up Plan: Additional outreach attempts will be made to reach the patient to complete the Transitions of Care (Post Inpatient/ED visit) call.   Signature Lewanda Rife, LPN

## 2023-04-21 NOTE — Telephone Encounter (Signed)
Pt went back to the ED again on Friday for generalized weakness and would like to schedule an ED follow up. No appts available until June 6th. Is there somewhere that you would like me to work her in or will 05/08/2023 be okay?

## 2023-04-21 NOTE — Telephone Encounter (Signed)
Pt called stating she just got out of the hospital on Friday and need a hospital follow-up but the provider does not have any appointments until june

## 2023-04-21 NOTE — Telephone Encounter (Signed)
Pt called office to cancel appointment for Prolia injection for today.  Was called by Morrie Sheldon, front office staff reporting that Malena Catholic, [facility] EMT was on the phone asking for help getting in touch with pt's Neurologist.  Spoke with [name] [name] he reported that pt felt like she was having a seizure and needed to be transferred to Neurologist Dr. Sherryll Burger.  Explained that this was [facility] office and that dr. Sherryll Burger worked at [facility], I would not be able to transfer pt to another office.  I offered to get the phone number but pt reported that she had the number.  She called our office to cancel her appointment for Prolia injection.

## 2023-04-21 NOTE — Progress Notes (Deleted)
Pt presented for their subcutaneous Prolia injection. Pt was identified through two identifiers. Pt was given the information packets about the Prolia and told to schedule their next injection 6 months out. Pt tolerated the subq injection well in the left or right arm.

## 2023-04-22 ENCOUNTER — Encounter (INDEPENDENT_AMBULATORY_CARE_PROVIDER_SITE_OTHER): Payer: Self-pay

## 2023-04-22 ENCOUNTER — Encounter (INDEPENDENT_AMBULATORY_CARE_PROVIDER_SITE_OTHER): Payer: Medicare Other

## 2023-04-22 ENCOUNTER — Ambulatory Visit (INDEPENDENT_AMBULATORY_CARE_PROVIDER_SITE_OTHER): Payer: Medicare Other | Admitting: Vascular Surgery

## 2023-04-22 ENCOUNTER — Telehealth (INDEPENDENT_AMBULATORY_CARE_PROVIDER_SITE_OTHER): Payer: Self-pay

## 2023-04-22 MED FILL — Iron Sucrose Inj 20 MG/ML (Fe Equiv): INTRAVENOUS | Qty: 10 | Status: AC

## 2023-04-22 NOTE — Transitions of Care (Post Inpatient/ED Visit) (Signed)
Unable to reach pt by phone and left v/m requesting cb [phone].     04/22/2023  Name: [name] [name] MRN: [id number] DOB: 1941/02/10  Today's TOC FU Call Status: Today's TOC FU Call Status:: Unsuccessful Call (2nd Attempt) Unsuccessful Call (1st Attempt) Date: 04/21/23 Unsuccessful Call (2nd Attempt) Date: 04/22/23  Attempted to reach the patient regarding the most recent Inpatient/ED visit.  Follow Up Plan: Additional outreach attempts will be made to reach the patient to complete the Transitions of Care (Post Inpatient/ED visit) call.   Signature Lewanda Rife, LPN

## 2023-04-22 NOTE — Telephone Encounter (Signed)
Patient called to cancel appt for today. She states she has an post procedure appt for tomorrow that is preventing her from coming.  Thanks

## 2023-04-22 NOTE — Telephone Encounter (Signed)
Spoke with pt to schedule a hospital follow up and pt stated that she is not able to schedule any appts right now. Pt stated that she is in a post seizure phase and not sure what is going to happen. She stated that her mind is not clear and she has realized that she is not able to make decisions on her own right now. Pt woke up this morning not knowing where she was but knew that she was not in her right mind so she did not get up and called the EMT to come check her out. EMT stayed with her for awhile helped her get out of bed and watched her walk around. They tried to get her to go the hospital but she refused because she does not want to go back to [facility]. She has reached out to both her son and daughter but isn't sure what they are going to do or decide for her. I asked pt if she was well enough and safe to be home alone. She stated that her mind is getting clearer as the morning goes on, she is in her bed and knows not to get up without someone helping her. Pt was advised that if she needed anything to please give [place] a call or call 911. Pt gave a verbal understanding.

## 2023-04-23 ENCOUNTER — Emergency Department: Payer: Medicare Other

## 2023-04-23 ENCOUNTER — Inpatient Hospital Stay: Payer: Medicare Other

## 2023-04-23 ENCOUNTER — Telehealth: Payer: Self-pay

## 2023-04-23 ENCOUNTER — Emergency Department
Admission: EM | Admit: 2023-04-23 | Discharge: 2023-04-24 | Disposition: A | Discharge status: Home / Self Care | Payer: Medicare Other | Attending: Emergency Medicine | Admitting: Emergency Medicine

## 2023-04-23 ENCOUNTER — Other Ambulatory Visit: Payer: Self-pay

## 2023-04-23 DIAGNOSIS — R112 Nausea with vomiting, unspecified: Secondary | ICD-10-CM | POA: Insufficient documentation

## 2023-04-23 DIAGNOSIS — R462 Strange and inexplicable behavior: Secondary | ICD-10-CM | POA: Diagnosis not present

## 2023-04-23 DIAGNOSIS — I4891 Unspecified atrial fibrillation: Secondary | ICD-10-CM | POA: Diagnosis not present

## 2023-04-23 DIAGNOSIS — R410 Disorientation, unspecified: Secondary | ICD-10-CM | POA: Diagnosis present

## 2023-04-23 DIAGNOSIS — R4182 Altered mental status, unspecified: Secondary | ICD-10-CM | POA: Diagnosis not present

## 2023-04-23 DIAGNOSIS — Z79899 Other long term (current) drug therapy: Secondary | ICD-10-CM | POA: Diagnosis not present

## 2023-04-23 DIAGNOSIS — Z7901 Long term (current) use of anticoagulants: Secondary | ICD-10-CM | POA: Diagnosis not present

## 2023-04-23 DIAGNOSIS — R109 Unspecified abdominal pain: Secondary | ICD-10-CM | POA: Insufficient documentation

## 2023-04-23 HISTORY — DX: Altered mental status, unspecified: R41.82

## 2023-04-23 LAB — URINALYSIS, ROUTINE W REFLEX MICROSCOPIC
Bacteria, UA: NONE SEEN
Bilirubin Urine: NEGATIVE
Glucose, UA: NEGATIVE mg/dL
Hgb urine dipstick: NEGATIVE
Ketones, ur: NEGATIVE mg/dL
Leukocytes,Ua: NEGATIVE
Nitrite: NEGATIVE
Protein, ur: 30 mg/dL — AB
Specific Gravity, Urine: 1.014 (ref 1.005–1.030)
Squamous Epithelial / HPF: NONE SEEN /HPF (ref 0–5)
pH: 7 (ref 5.0–8.0)

## 2023-04-23 LAB — TROPONIN I (HIGH SENSITIVITY)
Troponin I (High Sensitivity): 10 ng/L (ref <18)
Troponin I (High Sensitivity): 11 ng/L (ref <18)

## 2023-04-23 LAB — URINE DRUG SCREEN, QUALITATIVE (ARMC ONLY)
Amphetamines, Ur Screen: NOT DETECTED
Barbiturates, Ur Screen: NOT DETECTED
Benzodiazepine, Ur Scrn: NOT DETECTED
Cannabinoid 50 Ng, Ur ~~LOC~~: NOT DETECTED
Cocaine Metabolite,Ur ~~LOC~~: NOT DETECTED
MDMA (Ecstasy)Ur Screen: NOT DETECTED
Methadone Scn, Ur: NOT DETECTED
Opiate, Ur Screen: NOT DETECTED
Phencyclidine (PCP) Ur S: NOT DETECTED
Tricyclic, Ur Screen: NOT DETECTED

## 2023-04-23 LAB — COMPREHENSIVE METABOLIC PANEL
ALT: 27 U/L (ref 0–44)
AST: 50 U/L — ABNORMAL HIGH (ref 15–41)
Albumin: 4.2 g/dL (ref 3.5–5.0)
Alkaline Phosphatase: 111 U/L (ref 38–126)
Anion gap: 8 (ref 5–15)
BUN: 27 mg/dL — ABNORMAL HIGH (ref 8–23)
CO2: 28 mmol/L (ref 22–32)
Calcium: 9 mg/dL (ref 8.9–10.3)
Chloride: 100 mmol/L (ref 98–111)
Creatinine, Ser: 1.25 mg/dL — ABNORMAL HIGH (ref 0.44–1.00)
GFR, Estimated: 43 mL/min — ABNORMAL LOW (ref >60)
Glucose, Bld: 113 mg/dL — ABNORMAL HIGH (ref 70–99)
Potassium: 4.2 mmol/L (ref 3.5–5.1)
Sodium: 136 mmol/L (ref 135–145)
Total Bilirubin: 1 mg/dL (ref 0.3–1.2)
Total Protein: 6.5 g/dL (ref 6.5–8.1)

## 2023-04-23 LAB — CBC WITH DIFFERENTIAL/PLATELET
Abs Immature Granulocytes: 0.02 10*3/uL (ref 0.00–0.07)
Basophils Absolute: 0 10*3/uL (ref 0.0–0.1)
Basophils Relative: 1 %
Eosinophils Absolute: 0.1 10*3/uL (ref 0.0–0.5)
Eosinophils Relative: 3 %
HCT: 31.2 % — ABNORMAL LOW (ref 36.0–46.0)
Hemoglobin: 9.8 g/dL — ABNORMAL LOW (ref 12.0–15.0)
Immature Granulocytes: 1 %
Lymphocytes Relative: 17 %
Lymphs Abs: 0.7 10*3/uL (ref 0.7–4.0)
MCH: 29.9 pg (ref 26.0–34.0)
MCHC: 31.4 g/dL (ref 30.0–36.0)
MCV: 95.1 fL (ref 80.0–100.0)
Monocytes Absolute: 0.3 10*3/uL (ref 0.1–1.0)
Monocytes Relative: 7 %
Neutro Abs: 3.1 10*3/uL (ref 1.7–7.7)
Neutrophils Relative %: 71 %
Platelets: 107 10*3/uL — ABNORMAL LOW (ref 150–400)
RBC: 3.28 MIL/uL — ABNORMAL LOW (ref 3.87–5.11)
RDW: 17.2 % — ABNORMAL HIGH (ref 11.5–15.5)
WBC: 4.3 10*3/uL (ref 4.0–10.5)
nRBC: 0 % (ref 0.0–0.2)

## 2023-04-23 MED ORDER — CALCIUM CARBONATE 1250 (500 CA) MG PO TABS: 1250.0000 mg | ORAL_TABLET | Freq: Two times a day (BID) | ORAL | Status: DC

## 2023-04-23 MED ORDER — LAMOTRIGINE 25 MG PO TABS: 150.0000 mg | ORAL_TABLET | Freq: Every day | ORAL | Status: DC

## 2023-04-23 MED ORDER — METOPROLOL SUCCINATE ER 25 MG PO TB24
25.0000 mg | ORAL_TABLET | Freq: Every day | ORAL | Status: DC
  Administered 2023-04-23: 25 mg via ORAL
  Filled 2023-04-23: qty 1

## 2023-04-23 MED ORDER — IOHEXOL 300 MG/ML  SOLN
75.0000 mL | Freq: Once | INTRAMUSCULAR | Status: AC | PRN
  Administered 2023-04-23: 75 mL via INTRAVENOUS

## 2023-04-23 MED ORDER — FUROSEMIDE 40 MG PO TABS: 40.0000 mg | ORAL_TABLET | ORAL | Status: DC

## 2023-04-23 MED ORDER — BRIVARACETAM 100 MG PO TABS
100.0000 mg | ORAL_TABLET | Freq: Every day | ORAL | Status: DC
  Administered 2023-04-23: 100 mg via ORAL
  Filled 2023-04-23 (×2): qty 1

## 2023-04-23 MED ORDER — APIXABAN 5 MG PO TABS
5.0000 mg | ORAL_TABLET | Freq: Two times a day (BID) | ORAL | Status: DC
  Administered 2023-04-23: 5 mg via ORAL
  Filled 2023-04-23: qty 1

## 2023-04-23 MED ORDER — ONDANSETRON HCL 4 MG/2ML IJ SOLN
4.0000 mg | Freq: Once | INTRAMUSCULAR | Status: AC
  Administered 2023-04-23: 4 mg via INTRAVENOUS
  Filled 2023-04-23: qty 2

## 2023-04-23 MED ORDER — LAMOTRIGINE 100 MG PO TABS
200.0000 mg | ORAL_TABLET | Freq: Every day | ORAL | Status: DC
  Administered 2023-04-23: 200 mg via ORAL
  Filled 2023-04-23: qty 2

## 2023-04-23 MED ORDER — MONTELUKAST SODIUM 10 MG PO TABS
10.0000 mg | ORAL_TABLET | Freq: Every day | ORAL | Status: DC
  Administered 2023-04-23: 10 mg via ORAL
  Filled 2023-04-23: qty 1

## 2023-04-23 MED ORDER — BRIVARACETAM 50 MG PO TABS: 1.0000 | ORAL_TABLET | Freq: Two times a day (BID) | ORAL | Status: DC

## 2023-04-23 MED ORDER — GABAPENTIN 100 MG PO CAPS
200.0000 mg | ORAL_CAPSULE | Freq: Three times a day (TID) | ORAL | Status: DC
  Administered 2023-04-23: 200 mg via ORAL
  Filled 2023-04-23: qty 2

## 2023-04-23 MED ORDER — CYANOCOBALAMIN 500 MCG PO TABS
1000.0000 ug | ORAL_TABLET | Freq: Every day | ORAL | Status: DC
  Administered 2023-04-23: 1000 ug via ORAL
  Filled 2023-04-23: qty 2

## 2023-04-23 MED ORDER — LOSARTAN POTASSIUM 25 MG PO TABS
12.5000 mg | ORAL_TABLET | Freq: Every day | ORAL | Status: DC
  Filled 2023-04-23: qty 0.5

## 2023-04-23 MED ORDER — SPIRONOLACTONE 12.5 MG HALF TABLET
12.5000 mg | ORAL_TABLET | ORAL | Status: DC
  Filled 2023-04-23: qty 1

## 2023-04-23 MED ORDER — BRIVARACETAM 25 MG PO TABS: 50.0000 mg | ORAL_TABLET | Freq: Every day | ORAL | Status: DC

## 2023-04-23 NOTE — ED Provider Notes (Signed)
[facility] Provider Note    Event Date/Time   First MD Initiated Contact with Patient 04/23/23 [phone]     (approximate)   History   Altered Mental Status   HPI  [name] [name] is a 82 y.o. female who presents to the ED for evaluation of Altered Mental Status   I review oncology clinic visit from 5/2.  History of anemia, thrombocytopenia and monoclonal lymphocytosis.  Seizure disorder.  Mitral valve replacement.  A-fib on apixaban.  Reduced EF.  Patient presents to the ED from her SNF for evaluation of bizarre behavior and altered mentation.  Patient was reportedly banging on the floor of her room yelling out for piece of paper to help her figure out "the timeline."  On arrival to the ED, she reports frustration that she was sent to the ED.  She reports that she only needed a piece of paper to help figure out if it was Monday or Tuesday.  She is tearful and frustrated with the situation.  She reports being on the floor but she was a "mirror-image with myself."  Denies A/V hallucinations.  Briviact was reportedly increased earlier this week   Physical Exam   Triage Vital Signs: ED Triage Vitals  Enc Vitals Group     BP 04/23/23 0113 109/81     Pulse Rate 04/23/23 0113 85     Resp 04/23/23 0113 16     Temp --      Temp src --      SpO2 04/23/23 0113 100 %     Weight --      Height --      Head Circumference --      Peak Flow --      Pain Score 04/23/23 0110 0     Pain Loc --      Pain Edu? --      Excl. in GC? --     Most recent vital signs: Vitals:   04/23/23 0400 04/23/23 0530  BP: (!) 95/52 110/72  Pulse: 71 70  Resp: 18 17  Temp:    SpO2: 99% 95%    General: Awake, no distress.  Oriented to location, situation, time, year and self CV:  Good peripheral perfusion.  Resp:  Normal effort.  Abd:  No distention.  Soft MSK:  No deformity noted.  Neuro:  No focal deficits appreciated. Cranial nerves II through XII intact 5/5 strength  and sensation in all 4 extremities Other:     ED Results / Procedures / Treatments   Labs (all labs ordered are listed, but only abnormal results are displayed) Labs Reviewed  CBC WITH DIFFERENTIAL/PLATELET - Abnormal; Notable for the following components:      Result Value   RBC 3.28 (*)    Hemoglobin 9.8 (*)    HCT 31.2 (*)    RDW 17.2 (*)    Platelets 107 (*)    All other components within normal limits  COMPREHENSIVE METABOLIC PANEL - Abnormal; Notable for the following components:   Glucose, Bld 113 (*)    BUN 27 (*)    Creatinine, Ser 1.25 (*)    AST 50 (*)    GFR, Estimated 43 (*)    All other components within normal limits  URINALYSIS, ROUTINE W REFLEX MICROSCOPIC - Abnormal; Notable for the following components:   Color, Urine YELLOW (*)    APPearance CLEAR (*)    Protein, ur 30 (*)    All other components within  normal limits  URINE DRUG SCREEN, QUALITATIVE ([facility] ONLY)  TROPONIN I (HIGH SENSITIVITY)  TROPONIN I (HIGH SENSITIVITY)  TROPONIN I (HIGH SENSITIVITY)    EKG Ventricularly paced rhythm with a rate of 71 bpm.  Leftward axis.  Left bundle.  No STEMI by Sgarbossa criteria.  RADIOLOGY CXR interpreted by me without evidence of acute cardiopulmonary pathology. CT head interpreted by me without evidence of acute intracranial pathology CT abdomen/pelvis interpreted by me without evidence of acute features  Official radiology report(s): CT ABDOMEN PELVIS W CONTRAST  Result Date: 04/23/2023 CLINICAL DATA:  Nausea and vomiting.  Diffuse abdominal pain EXAM: CT ABDOMEN AND PELVIS WITH CONTRAST TECHNIQUE: Multidetector CT imaging of the abdomen and pelvis was performed using the standard protocol following bolus administration of intravenous contrast. RADIATION DOSE REDUCTION: This exam was performed according to the departmental dose-optimization program which includes automated exposure control, adjustment of the mA and/or kV according to patient size and/or use  of iterative reconstruction technique. CONTRAST:  75mL OMNIPAQUE IOHEXOL 300 MG/ML  SOLN COMPARISON:  01/26/2023 FINDINGS: Lower chest: Cardiomegaly with right ventricular pacer lead and mitral valve replacement, partially covered. No acute finding in the lower chest Hepatobiliary: Innumerable liver cysts accentuated around the gallbladder fossa. No evidence of biliary calcification or obstruction. Pancreas: Unremarkable. Spleen: Unremarkable. Adrenals/Urinary Tract: Negative adrenals. No hydronephrosis or stone. 3 cm right renal cyst. No follow-up imaging is recommended given simple appearance. Unremarkable bladder. Stomach/Bowel: No obstruction. Generalized stool desiccation. No evidence of bowel inflammation. Vascular/Lymphatic: No acute vascular abnormality. Scattered atheromatous calcification. No mass or adenopathy. Reproductive:No pathologic findings. Other: No ascites or pneumoperitoneum. Shallow right groin hernia containing trace ascitic fluid. Musculoskeletal: Remote right lower rib fractures. Chronic sacral insufficiency fractures with bilateral and midline sclerosis. Chronic right sacral ala and right pubic body fractures with incomplete bony bridging at the ala. Postoperative proximal left femur. Lumbar spine degeneration is severe with pronounced dextroscoliosis. IMPRESSION: 1. No acute finding. 2. Numerous chronic findings are listed above. Electronically Signed   By: Tiburcio Pea M.D.   On: 04/23/2023 05:44   DG Chest Portable 1 View  Result Date: 04/23/2023 CLINICAL DATA:  Altered, bizarre behavior EXAM: PORTABLE CHEST 1 VIEW COMPARISON:  Chest radiograph 03/18/2023 FINDINGS: Stable cardiomegaly. Left atrial appendage occlusion device. AVR. Left chest wall pacemaker. Chronic interstitial coarsening. Chronic scar right mid lung. No pleural effusion or pneumothorax. No displaced rib fractures. IMPRESSION: No active disease. Cardiomegaly and chronic interstitial coarsening. Electronically  Signed   By: Minerva Fester M.D.   On: 04/23/2023 02:02   CT HEAD WO CONTRAST ( )  Result Date: 04/23/2023 CLINICAL DATA:  Bizarre behavior on Eliquis. EXAM: CT HEAD WITHOUT CONTRAST TECHNIQUE: Contiguous axial images were obtained from the base of the skull through the vertex without intravenous contrast. RADIATION DOSE REDUCTION: This exam was performed according to the departmental dose-optimization program which includes automated exposure control, adjustment of the mA and/or kV according to patient size and/or use of iterative reconstruction technique. COMPARISON:  CT 04/19/2023 FINDINGS: Brain: No intracranial hemorrhage, mass effect, or evidence of acute infarct. No hydrocephalus. No extra-axial fluid collection. Mild generalized cerebral atrophy. Vascular: No hyperdense vessel. Intracranial arterial calcification. Skull: No fracture or focal lesion. Sinuses/Orbits: No acute finding. Frothy mucous in the left sphenoid sinus. Paranasal sinuses and mastoid air cells are otherwise well aerated. Other: None. IMPRESSION: 1. No acute intracranial abnormality. Electronically Signed   By: Minerva Fester M.D.   On: 04/23/2023 01:46    PROCEDURES and INTERVENTIONS:  .1-3 Lead  EKG Interpretation  Performed by: Delton Prairie, MD Authorized by: Delton Prairie, MD     Interpretation: normal     ECG rate:  70   ECG rate assessment: normal     Rhythm: sinus rhythm     Ectopy: none     Conduction: normal     Medications  ondansetron (ZOFRAN) injection 4 mg (4 mg Intravenous Given 04/23/23 0431)  iohexol (OMNIPAQUE) 300 MG/ML solution 75 mL (75 mLs Intravenous Contrast Given 04/23/23 0502)     IMPRESSION / MDM / ASSESSMENT AND PLAN / ED COURSE  I reviewed the triage vital signs and the nursing notes.  Differential diagnosis includes, but is not limited to, toxic metabolic encephalopathy, medication side effect, intracranial hemorrhage, sepsis, UTI, dehydration or AKI, polypharmacy or polysubstance  abuse  {Patient presents with symptoms of an acute illness or injury that is potentially life-threatening.  Patient presents with apparent subacute confusion that is been progressively worsening at her independent living facility.  She was being on the floor of her facility and demanding paper to figure out "the timeline."  I see no evidence of acute pathology here in the ED.  No neurologic deficits.  She is fully oriented and just reports frustration that she could not get paper and is otherwise linear, logical and looks well.  She has a benign workup with reassuring vital signs, blood work and urine.  CBC with chronic normocytic anemia at baseline.  CKD, UA without infectious features and clear UDS.  Multiple troponins are negative and she has benign imaging.  Furthermore, nursing staff reached out to the independent living facility and reports that patient "has been doing this" and they are working on increasing her level of care to assisted living.  Ultimately, she seems fairly well without any clear evidence of acute derangements.  She will be returning to a monitored facility and I think this is reasonable, though I did consider observation admission.  I briefly considered MRI to look for an acute stroke, but her cardiac device is not compatible and overall the clinical picture is fairly unlikely represent an acute stroke so we will have her return to her facility  Clinical Course as of 04/23/23 0633  Wed Apr 23, 2023  0403 Reassessed, reevaluated and discussed plan of care. [DS]  1610 I call Brett, straight to VM. I leave VM  [DS]  0425 Patient nauseous on awakening from sleep  [DS]  0446 Reassessed.  Clutched over an emesis bag and complaining of nausea.  Reports that she has been feeling nauseous whole time since she has been here but was "holding it in and did not want to tell you." [DS]  0447 Also now remembers that it was Briviact that was increased this week [DS]  0618 Reassessed.   Feeling better now [DS]  0618 Tolerating p.o.  Requesting to go back to her facility [DS]    Clinical Course User Index [DS] Delton Prairie, MD     FINAL CLINICAL IMPRESSION(S) / ED DIAGNOSES   Final diagnoses:  Confusion     Rx / DC Orders   ED Discharge Orders     None        Note:  This document was prepared using Dragon voice recognition software and may include unintentional dictation errors.   Delton Prairie, MD 04/23/23 [phone]

## 2023-04-23 NOTE — Telephone Encounter (Signed)
Spoke with pt's son in regards to Dr. Melina Schools message below. Son stated that pt is currently in the psych ED waiting to have a psych evaluation done.

## 2023-04-23 NOTE — ED Provider Notes (Addendum)
This patient was awaiting transport back to her assisted living facility.  I got a notification from our nurse who called the facility who states that they are "uncomfortable" taking the patient back at this time given her behaviors last night.  Our social worker contacted the facility, state that they want a psychiatric evaluation prior to accepting her back to the facility.  The patient remained stable and was otherwise medically cleared for discharge by night provider. I put in for psychiatric consultation.     I spoke with the patient's son who states that his mother has been in and out of the emergency department recently and is having a hard time accepting the fact that she may need more assistance at home, is very reluctant to lose her independence.  He states that when he spoke to her in the emergency department this morning and suggested assisted living/nursing facility that the patient was upset by this and stated " can I go home if I play dead?"  and has since been unwilling to engage with son or staff.   Psych eval pending.    --- Psychiatry has evaluated the patient.  They recommend further medical evaluation because she is not engaging.  At this time I do not think it is necessary to reorder repeat labs and she has already had a CT scan of her head and abdomen pelvis as well as urinalysis showed no acute findings to explain her mental status change.  I will order an MRI of the brain and consult with hospitalist for admission for altered mental status. ---   Pilar Jarvis, MD 04/23/23 1610    Pilar Jarvis, MD 04/23/23 1416    Pilar Jarvis, MD 04/23/23 [phone]

## 2023-04-23 NOTE — ED Notes (Signed)
Dr Wong at bedside

## 2023-04-23 NOTE — ED Provider Notes (Signed)
Procedures  Clinical Course as of 04/23/23 2021  Wed Apr 23, 2023  0403 Reassessed, reevaluated and discussed plan of care. [DS]  1610 I call Brett, straight to VM. I leave VM  [DS]  0425 Patient nauseous on awakening from sleep  [DS]  0446 Reassessed.  Clutched over an emesis bag and complaining of nausea.  Reports that she has been feeling nauseous whole time since she has been here but was "holding it in and did not want to tell you." [DS]  0447 Also now remembers that it was Briviact that was increased this week [DS]  0618 Reassessed.  Feeling better now [DS]  0618 Tolerating p.o.  Requesting to go back to her facility [DS]    Clinical Course User Index [DS] Delton Prairie, MD    ----------------------------------------- 8:21 PM on 04/23/2023 ----------------------------------------- Patient was seen and evaluated by hospitalist at about 5:00 PM, noted to be neurologically intact at that time and returned to her baseline.  Symptoms strongly suggestive of underlying psychiatric illness, possibly acute psychosis versus dementia with complication versus medication side effect.  Case was briefly discussed with neurology who also recommended obtaining B12 and RPR levels.  Psychiatry contacted at 5:45 PM to reaffirm primary need for psychiatry evaluation and disposition at this point.  Eval was deferred to night shift.  Med rec done, home meds ordered, Briviact at lower dose.     Sharman Cheek, MD 04/23/23 2024

## 2023-04-23 NOTE — ED Triage Notes (Signed)
Pt from [facility]- recent confusion with med adjustments. Staff called facility. She was reportedltly on floor banging on floor asking neighbor to bring her paper to write a timeline.

## 2023-04-23 NOTE — ED Notes (Signed)
[name] [name] from [facility] [phone]

## 2023-04-23 NOTE — BH Assessment (Signed)
Writer spoke to patient's son who believes the patient's recent med adjustment could possibly be causing the confusion and altered mental status. Writer attempted to assess patient; however, patient is not responding. Patient is able to twitch her eyes but no other movement. Patient's son said patient was speaking to him earlier this morning stating she would "play dead" so she could return to her home; however, he does not think this is what is happening at this moment. Patient's son states "I think this is a medical issue and not related to psych." Writer informed patient's nurse.

## 2023-04-23 NOTE — BH Assessment (Signed)
Psych Team unable to assess patient at this time. Patient would not participate during interview and remained partially covered with blanket.

## 2023-04-23 NOTE — ED Notes (Signed)
Patient abruptly sat up in bed and told the son and myself she was playing a game and not responding to staff. Patient reported to son and this RN she has a pacemaker and cannot receive a MRI. The patient reported she wants to go home. Son at bedside speaking with patient at this time

## 2023-04-23 NOTE — Discharge Instructions (Signed)
You were seen by the psychiatrist who did not feel that you needed any further treatment at this time.  Increasing level of care to assisted living seems reasonable  Return to the ED with any other acute concerns  Continue her prescription medications

## 2023-04-23 NOTE — ED Notes (Signed)
Called and spoke with staff from [facility] who report pt has had similar episodes of feeling confused, as she did tonight. Staff reports pt has had increased confusion over the last week. SW at [facility] has been working with pt and family on transitioning from independent living. MD made aware of information. Attempted to call son and daughter per pt request, no answer. Transportation back to [facility] will be available after 8am [phone].

## 2023-04-23 NOTE — ED Notes (Signed)
Assortment of medications brought in by Nelva Bush (contact info in chart) per hospital request for Brivaracetam that is not a hospital supplied item.   Was unable to determine which med was which so brought entire [employer]. Labeled and delivered to pharmacy.   Unable to objectively label medication.

## 2023-04-23 NOTE — ED Notes (Signed)
Son talking with SW at this time in family room.

## 2023-04-23 NOTE — Consult Note (Signed)
Initial Consultation Note   Patient: [name] [name] ZOX:[id number] DOB: January 21, 1941 PCP: Sherlene Shams, MD DOA: 04/23/2023 DOS: the patient was seen and examined on 04/23/2023 Primary service: No att. providers found  Referring physician: Dr. Modesto Charon Reason for consult: Altered Mental Status   Assessment/Plan: Assessment and Plan:  # Behavioral Disturbance: Patient is presenting with several week history of abnormal behavior. Per chart review, she has been seen in the ED on 5/16 and 5/18 for falls.  She states that the first time, she had a mechanical fall and then went to sit and felt that the entire lower body from her axilla down was numb and wobbly.  Then on 5/18, she was concerned for a breakthrough seizure.  On 5/20, her neurologist Dr. Clelia Croft increased her Briviact.  Since then, patient has had multiple bizarre events.  Please see PCPs telephone note from today.  Today, she became mute and "unresponsive" after being told she must go to her SNF.  Since then, patient has returned back to her baseline, stating that she was just upset.  Overall, differential includes adverse side effect from Briviact versus functional neurological disorder versus primary psychological disorder.  Patient has refused SNF placement if indicated and states she already has home health set up at home.  No indication for admission at this point.  - Cancel MRI - Recommend psychiatric evaluation - Recommend decreasing dose of Briviact or considering alternate medication  TRH will sign off at present, please call us again when needed.  HPI: [name] [name] is a 82 y.o. female with past medical history of focal seizure disorder, HFrEF, atrial fibrillation on Eliquis, right bundle branch block, CKD stage IIIa, ITP, TIA who initially presented to the ED due to bizarre disturbance.  History obtained through chart review and from patient.  Both patient's son and patient are poor historians with inconsistent timelines.  Today,  [name] [name] was noted to be acting bizarrely by the staffing at the assisted living facility.  She was sent to the ED for evaluation.  Psychiatry was consulted after being medically cleared by EDP.  After son came to patient's bedside and reported to her that he felt she was unsafe to continue living on his own and should consider a nursing facility, patient became upset and refused to interact.  She laid motionless even when others were trying to interact with her.  At this time, [name] [name] states that she was just very upset by what her son said and was "just trying to play along with his game."  She denies any focal neurological weakness, dizziness, double vision.  Review of Systems: As mentioned in the history of present illness. All other systems reviewed and are negative.  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 Ladona Ridgel   Cataract    Left eye   Chronic kidney disease (CKD) stage G3a/A2, moderately decreased glomerular filtration rate (GFR) between 45-59 mL/min/1.73 square meter and albuminuria creatinine ratio between 30-299 mg/g (HCC) 11/13/2021   Closed bilateral fracture of  pubic rami (HCC) 08/28/2022   Diverticulosis    Focal seizures (HCC)    Heart murmur    Hematuria, gross 04/09/2018   Hemorrhoid    Hepatic cyst    innumerable   History of asbestos exposure    Hyperlipidemia    IBS (irritable bowel syndrome)    Idiopathic thrombocytopenic purpura (ITP) (HCC)    Migraine    MVP (mitral valve prolapse)    Near syncope 01/26/2023   Nodule of right lung    Osteoporosis of  forearm    RBBB    Restrictive lung disease    Mild on PFT & likely cardiac in etiology    Right sided sciatica 04/10/2022   S/P Minimally invasive maze operation for atrial fibrillation 10/31/2015   Complete bilateral atrial lesion set using cryothermy and bipolar radiofrequency ablation with clipping of LA appendage via right mini thoracotomy approach   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   SVT (supraventricular tachycardia)    Thoracic aorta atherosclerosis (HCC)    TIA (transient ischemic attack)    Visit for preventive health examination 05/23/2016   Past Surgical History:  Procedure Laterality Date   BREAST EXCISIONAL BIOPSY Right Late 80s   Negative X2   CARDIAC CATHETERIZATION N/A 10/18/2015   Procedure: Right/Left Heart Cath and Coronary Angiography;  Surgeon: Tonny Bollman, MD;  Location: [facility] INVASIVE CV LAB;  Service: Cardiovascular;  Laterality: N/A;   CARDIOVERSION N/A 08/22/2015   Procedure: CARDIOVERSION;  Surgeon: Vesta Mixer, MD;  Location: [facility] ENDOSCOPY;  Service: Cardiovascular;  Laterality: N/A;   COLONOSCOPY  2003   EP IMPLANTABLE DEVICE N/A 11/06/2015   Procedure: Pacemaker Implant;  Surgeon: Marinus Maw, MD;  Location: [facility] INVASIVE CV LAB;  Service: Cardiovascular;  Laterality: N/A;   EP IMPLANTABLE DEVICE N/A 02/20/2016   Procedure: Lead Extraction;  Surgeon: Marinus Maw, MD;  Location: [facility] INVASIVE CV LAB;  Service: Cardiovascular;  Laterality: N/A;   EYE SURGERY Right 2013   FEMUR IM NAIL Left 06/18/2021   Procedure: INTRAMEDULLARY (IM) NAIL FEMORAL, OPEN REDUCTION INTERNAL FIXATION FEMUR RIGHT LITTLE FINGER PIP CLOSED REDUCTION;  Surgeon: Myrene Galas, MD;  Location: [facility] OR;  Service: Orthopedics;  Laterality: Left;   FOOT SURGERY     ~2007 right foot bunion   MANDIBLE FRACTURE SURGERY  03/26/2013   MINIMALLY INVASIVE MAZE PROCEDURE N/A 10/31/2015   Procedure: MINIMALLY INVASIVE MAZE PROCEDURE;  Surgeon: Purcell Nails, MD;  Location: [facility] OR;  Service: Open Heart Surgery;  Laterality: N/A;   MITRAL VALVE REPLACEMENT Right 10/31/2015   Procedure: MINIMALLY INVASIVE MITRAL VALVE (MV) REPLACEMENT;  Surgeon: Purcell Nails, MD;  Location: [facility] OR;  Service: Open Heart Surgery;  Laterality: Right;   PACEMAKER LEAD REMOVAL  02/20/2016   TEE WITH CARDIOVERSION     TEE WITHOUT CARDIOVERSION N/A 08/22/2015   Procedure: TRANSESOPHAGEAL ECHOCARDIOGRAM (TEE);  Surgeon: Vesta Mixer, MD;  Location[facility] ENDOSCOPY;  Service: Cardiovascular;  Laterality: N/A;   TEE WITHOUT CARDIOVERSION N/A 10/31/2015   Procedure: TRANSESOPHAGEAL ECHOCARDIOGRAM (TEE);  Surgeon: Purcell Nails, MD;  Location: [facility] OR;  Service: Open Heart Surgery;  Laterality: N/A;   TUBAL LIGATION  VARICOSE VEIN SURGERY Right    Social History:  reports that she has never smoked. She has never used smokeless tobacco. She reports that she does not drink alcohol and does not use drugs.  Allergies  Allergen Reactions   Codeine Nausea And Vomiting and Other (See Comments)    migraine   Epinephrine Palpitations and Shortness Of Breath   Hydrocodone Nausea And Vomiting and Other (See Comments)    MIGRAINE   Hydromorphone Nausea And Vomiting and Other (See Comments)    migraine   Molds & Smuts Anxiety, Other (See Comments) and Shortness Of Breath   Oxycodone Nausea And Vomiting and Other (See Comments)    Severe migraine   Meloxicam Other (See Comments)    Severe reflux   Codeine Other (See Comments)    Migraine   Dextromethorphan Other (See Comments)    seizures   Diphen [Diphenhydramine Hcl] Other (See Comments)    seizure   Diphenhydramine Other (See Comments)     seizure   Diphenhydramine Hcl     Other reaction(s): Other (See Comments)   Diphenylpyraline Other (See Comments)   Doxycycline Itching, Swelling and Other (See Comments)    Facial   Doxycycline Itching and Swelling   Hydrocodone Other (See Comments)    Migraine   Hydromorphone Other (See Comments)    Migraine   Meloxicam Other (See Comments)   Mobic [Meloxicam] Other (See Comments)    reflux   Nsaids    Nsaids Other (See Comments)    Pt on blood thinner   Oxycodone Other (See Comments)    Migraine   Propofol Other (See Comments)   Propofol Other (See Comments)    Very sensitive; patient stated she was told she was told she had apnea    Family History  Problem Relation Age of Onset   Hypertension Mother    Arrhythmia Mother    Heart failure Mother    Arrhythmia Brother    Stroke Brother 35       cerebral hemorrhage, nonsmoker, no HTN   Prostate cancer Brother    Stroke Father        from an aneurysm   Stroke Maternal Aunt 83       cerebral hemorrhage   Liver cancer Maternal Grandmother    Atrial fibrillation Son    Celiac disease Son    Heart attack Neg Hx    Breast cancer Neg Hx     Prior to Admission medications   Medication Sig Start Date End Date Taking? Authorizing Provider  BRIVIACT 50 MG TABS Take 1 tablet by mouth 2 (two) times daily. 03/20/22  Yes [provider]  butalbital-acetaminophen-caffeine (FIORICET) 50-325-40 MG tablet TAKE 1 TABLET BY MOUTH ONCE DAILY AS NEEDED FOR BACK PAIN OR MIGRAINE MAX OF 3 TABS PER DAY 08/28/22  Yes Sherlene Shams, MD  calcium carbonate (CALCIUM 600) 600 MG TABS tablet 600 mg 2 (two) times daily with a meal. 07/09/22  Yes [provider]  Cholecalciferol (VITAMIN D) 2000 units CAPS Take 2,000 Units by mouth daily.   Yes [provider]  ELIQUIS 5 MG TABS tablet TAKE ONE TABLET TWICE DAILY Patient taking differently: Take 5 mg by mouth 2 (two) times daily. 12/03/22  Yes Gollan, Tollie Pizza, MD   fluticasone (FLONASE) 50 MCG/ACT nasal spray USE 2 SPRAYS IN BOTH NOSTRILS DAILY Patient taking differently: Place 2 sprays into both nostrils daily. 10/07/22  Yes Sherlene Shams, MD  folic acid (FOLVITE) 1 MG tablet  Take 1 mg by mouth daily.   Yes [provider]  furosemide (LASIX) 40 MG tablet TAKE ONE (1) TABLET BY MOUTH TWICE PER WEEK; TAKE AN ADDITIONAL HALF (1/2) TO ONE (1) TABLET AS NEEDED FOR FLUID RETENTION Patient taking differently: Take 40 mg by mouth 2 (two) times a week. TAKE ONE (1) TABLET BY MOUTH TWICE PER WEEK; TAKE AN ADDITIONAL HALF (1/2) TO ONE (1) TABLET AS NEEDED FOR FLUID RETENTION 03/31/23  Yes Hammock, Sheri, NP  gabapentin (NEURONTIN) 100 MG capsule Take 200 mg by mouth 3 (three) times daily. 05/06/22 05/06/23 Yes [provider]  lamoTRIgine (LAMICTAL) 150 MG tablet Take 150 mg by mouth daily.   Yes [provider]  lamoTRIgine (LAMICTAL) 200 MG tablet Take 200 mg by mouth at bedtime. 10/18/21  Yes [provider]  losartan (COZAAR) 25 MG tablet Take 0.5 tablets (12.5 mg total) by mouth daily. 03/04/23  Yes Gollan, Tollie Pizza, MD  metoprolol succinate (TOPROL-XL) 25 MG 24 hr tablet TAKE ONE TABLET (25 MG) BY MOUTH EVERY DAY Patient taking differently: Take 25 mg by mouth daily. 02/11/23  Yes Gollan, Tollie Pizza, MD  montelukast (SINGULAIR) 10 MG tablet TAKE ONE TABLET BY MOUTH AT BEDTIME Patient taking differently: Take 10 mg by mouth at bedtime. 01/09/23  Yes Sherlene Shams, MD  polyethylene glycol powder (GLYCOLAX/MIRALAX) 17 GM/SCOOP powder Take 1 Dose by mouth daily. 05/24/19  Yes [provider]  rosuvastatin (CRESTOR) 40 MG tablet Take 1 tablet (40 mg total) by mouth daily. 02/03/23  Yes Sherlene Shams, MD  spironolactone (ALDACTONE) 25 MG tablet Take 0.5 tablets (12.5 mg total) by mouth every other day. 11/12/22  Yes Gollan, Tollie Pizza, MD  VENTOLIN HFA 108 (90 Base) MCG/ACT inhaler INHALE 2 PUFFS INTO THE LUNGS EVERY 6 HOURS AS  NEEDED FOR WHEEZING OR SHORTNESS OF BREATH Patient taking differently: Inhale 2 puffs into the lungs every 6 (six) hours as needed for wheezing or shortness of breath. 10/07/22  Yes Sherlene Shams, MD  vitamin B-12 (CYANOCOBALAMIN) 1000 MCG tablet Take 1,000 mcg by mouth daily. 10/02/20  Yes [provider]  metaxalone (SKELAXIN) 800 MG tablet TAKE ONE TABLET BY MOUTH 3 TIMES DAILY Patient not taking: Reported on 04/23/2023 08/15/22   Sherlene Shams, MD    Physical Exam: Vitals:   04/23/23 1530 04/23/23 1600 04/23/23 1602 04/23/23 1700  BP: 111/69 106/69  102/69  Pulse: 73 71 72   Resp:   18   Temp:      SpO2: 99% 97% 97%    Physical Exam Vitals and nursing note reviewed.  Constitutional:      General: She is not in acute distress.    Appearance: She is normal weight.  HENT:     Head: Normocephalic and atraumatic.     Mouth/Throat:     Mouth: Mucous membranes are moist.     Pharynx: Oropharynx is clear.  Eyes:     Conjunctiva/sclera: Conjunctivae normal.     Pupils: Pupils are equal, round, and reactive to light.  Cardiovascular:     Rate and Rhythm: Normal rate and regular rhythm.     Heart sounds: Murmur (diastolic murmur, 2/6) heard.  Pulmonary:     Effort: Pulmonary effort is normal. No respiratory distress.     Breath sounds: Normal breath sounds. No wheezing, rhonchi or rales.  Musculoskeletal:     Cervical back: Neck supple.     Right lower leg: 1+ Pitting Edema present.  Left lower leg: 1+ Pitting Edema present.  Skin:    General: Skin is warm and dry.  Neurological:     Mental Status: She is alert and oriented to person, place, and time.     Comments:  Alert and oriented x3. 5/5 strength in bilateral upper and left lower extremities. 4/5 strength of the right lower extremity, chronic and unchanged. Cranial nerves 2-12 intact. No facial asymmetry. No dysarthria. Sensation intact throughout.   Psychiatric:        Attention and Perception: She is  inattentive. She does not perceive auditory or visual hallucinations.        Mood and Affect: Mood is anxious.        Speech: Speech is rapid and pressured.        Judgment: Judgment is impulsive.    Data Reviewed:  Lab Results  Component Value Date   WBC 4.3 04/23/2023   HGB 9.8 (L) 04/23/2023   HCT 31.2 (L) 04/23/2023   MCV 95.1 04/23/2023   PLT 107 (L) 04/23/2023   CMP with sodium of 136, potassium 4.2, bicarb 28, glucose 113, BUN 27, creatinine 1.25, AST 50, ALT 27 and GFR 43.  CT ABDOMEN PELVIS W CONTRAST  Result Date: 04/23/2023 CLINICAL DATA:  Nausea and vomiting.  Diffuse abdominal pain EXAM: CT ABDOMEN AND PELVIS WITH CONTRAST TECHNIQUE: Multidetector CT imaging of the abdomen and pelvis was performed using the standard protocol following bolus administration of intravenous contrast. RADIATION DOSE REDUCTION: This exam was performed according to the departmental dose-optimization program which includes automated exposure control, adjustment of the mA and/or kV according to patient size and/or use of iterative reconstruction technique. CONTRAST:  75mL OMNIPAQUE IOHEXOL 300 MG/ML  SOLN COMPARISON:  01/26/2023 FINDINGS: Lower chest: Cardiomegaly with right ventricular pacer lead and mitral valve replacement, partially covered. No acute finding in the lower chest Hepatobiliary: Innumerable liver cysts accentuated around the gallbladder fossa. No evidence of biliary calcification or obstruction. Pancreas: Unremarkable. Spleen: Unremarkable. Adrenals/Urinary Tract: Negative adrenals. No hydronephrosis or stone. 3 cm right renal cyst. No follow-up imaging is recommended given simple appearance. Unremarkable bladder. Stomach/Bowel: No obstruction. Generalized stool desiccation. No evidence of bowel inflammation. Vascular/Lymphatic: No acute vascular abnormality. Scattered atheromatous calcification. No mass or adenopathy. Reproductive:No pathologic findings. Other: No ascites or pneumoperitoneum.  Shallow right groin hernia containing trace ascitic fluid. Musculoskeletal: Remote right lower rib fractures. Chronic sacral insufficiency fractures with bilateral and midline sclerosis. Chronic right sacral ala and right pubic body fractures with incomplete bony bridging at the ala. Postoperative proximal left femur. Lumbar spine degeneration is severe with pronounced dextroscoliosis. IMPRESSION: 1. No acute finding. 2. Numerous chronic findings are listed above. Electronically Signed   By: Tiburcio Pea M.D.   On: 04/23/2023 05:44   DG Chest Portable 1 View  Result Date: 04/23/2023 CLINICAL DATA:  Altered, bizarre behavior EXAM: PORTABLE CHEST 1 VIEW COMPARISON:  Chest radiograph 03/18/2023 FINDINGS: Stable cardiomegaly. Left atrial appendage occlusion device. AVR. Left chest wall pacemaker. Chronic interstitial coarsening. Chronic scar right mid lung. No pleural effusion or pneumothorax. No displaced rib fractures. IMPRESSION: No active disease. Cardiomegaly and chronic interstitial coarsening. Electronically Signed   By: Minerva Fester M.D.   On: 04/23/2023 02:02   CT HEAD WO CONTRAST ( )  Result Date: 04/23/2023 CLINICAL DATA:  Bizarre behavior on Eliquis. EXAM: CT HEAD WITHOUT CONTRAST TECHNIQUE: Contiguous axial images were obtained from the base of the skull through the vertex without intravenous contrast. RADIATION DOSE REDUCTION: This exam was performed according  to the departmental dose-optimization program which includes automated exposure control, adjustment of the mA and/or kV according to patient size and/or use of iterative reconstruction technique. COMPARISON:  CT 04/19/2023 FINDINGS: Brain: No intracranial hemorrhage, mass effect, or evidence of acute infarct. No hydrocephalus. No extra-axial fluid collection. Mild generalized cerebral atrophy. Vascular: No hyperdense vessel. Intracranial arterial calcification. Skull: No fracture or focal lesion. Sinuses/Orbits: No acute finding.  Frothy mucous in the left sphenoid sinus. Paranasal sinuses and mastoid air cells are otherwise well aerated. Other: None. IMPRESSION: 1. No acute intracranial abnormality. Electronically Signed   By: Minerva Fester M.D.   On: 04/23/2023 01:46     There are no new results to review at this time.   Family Communication: Patient's son updated at bedside Primary team communication: EDP updated Thank you very much for involving [place] in the care of your patient.  Author: Verdene Lennert, MD 04/23/2023 5:18 PM  For on call review [email or web address].

## 2023-04-23 NOTE — TOC Initial Note (Addendum)
Transition of Care [facility]) - Initial/Assessment Note    Patient Details  Name: [name] [name] MRN: [id number] Date of Birth: 29-Feb-1941  [facility] CM/SW Contact:    Darolyn Rua, LCSW Phone Number: 04/23/2023, 9:30 AM  Clinical Narrative:                   Update: PT and OT recommending SNF, lvm with son to inform to see if he is agreeable. He is aware it would be private pay, [name] at [facility] updated. Pending response from son on appropriate disposition.      CSW spoke with [name] at [facility], she reports patient's Independent Living she is from is requesting a psych consult.  They stated patient was found on her hands and knees crawling, and wrote many sticky notes around her apartment that just say "crazy" on each one of them.   They report patient was also found banging on pots and pans at one time, with bizarre behaviors   CSW updated MD on above, psych to be consulted.   Spoke with son at bedside he reports being interested in pscyh results, medical results and ultimately would be agreeable for SNF at [facility] if recommended.    Expected Discharge Plan:  (TBD) Barriers to Discharge: Continued Medical Work up   Patient Goals and CMS Choice   CMS Medicare.gov Compare Post Acute Care list provided to:: Patient Represenative (must comment) (son)        Expected Discharge Plan and Services       Living arrangements for the past 2 months: Independent Living Facility [facility])                                      Prior Living Arrangements/Services Living arrangements for the past 2 months: Independent Living Facility ([facility]) Lives with:: Facility Resident                   Activities of Daily Living      Permission Sought/Granted                  Emotional Assessment              Admission diagnosis:  Confusion Patient Active Problem List   Diagnosis Date Noted   IDA (iron deficiency anemia)  04/03/2023   CAP (community acquired pneumonia) 01/26/2023   Sepsis due to pneumonia (HCC) 01/26/2023   Polypharmacy 08/28/2022   Exposure to mold 08/28/2022   Candidiasis of anus 08/28/2022   Right groin pain 06/26/2022   Lumbar radiculitis 05/09/2022   Right sided sciatica 04/10/2022   Anemia of chronic disease 04/02/2022   DDD (degenerative disc disease), lumbar 03/31/2022   Spondylosis 03/31/2022   At high risk for falls 03/31/2022   Chronic kidney disease (CKD) stage G3a/A2, moderately decreased glomerular filtration rate (GFR) between 45-59 mL/min/1.73 square meter and albuminuria creatinine ratio between 30-299 mg/g (HCC) 11/13/2021   Do not resuscitate status 08/14/2021   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   Elevated serum lactate dehydrogenase (LDH) 08/01/2021   PAF (paroxysmal atrial fibrillation) (HCC) 06/19/2021   Osteoporosis 06/19/2021   Seizure disorder (HCC) 06/19/2021   Hyperlipidemia 06/19/2021   Concern about end of life 04/24/2021   Chronic bilateral low back pain without sciatica 04/12/2021   Fall as cause of accidental injury at home as place of occurrence 04/12/2021  Acquired thrombophilia (HCC) 06/14/2020   Carotid stenosis 04/21/2020   Inguinal hernia of right side without obstruction or gangrene 02/09/2020   Sinus node dysfunction (HCC) 07/02/2019   Intracranial atherosclerosis 10/06/2018   Focal seizures (HCC) 05/14/2018   Arthritis of both hands 12/14/2017   Allergy to environmental factors 08/27/2017   Anemia in chronic kidney disease 02/17/2017   Visit for preventive health examination 05/23/2016   Hepatic cyst 05/04/2016   Chronic systolic congestive heart failure (HCC) 05/03/2016   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   H/O mitral valve replacement 10/31/2015   S/P Minimally invasive maze operation for atrial fibrillation 10/31/2015   Atrial fibrillation, persistent (HCC)    Restrictive lung disease  09/12/2015   Pulmonary hypertension (HCC) 08/08/2015   Nodule of right lung 08/04/2015   Thoracic aorta atherosclerosis (HCC) 02/05/2015   Family history of cerebral aneurysm 08/02/2014   Diverticulosis of colon without hemorrhage 01/09/2014   Hospital discharge follow-up 04/15/2013   Thrombocytopenia (HCC) 02/04/2013   IBS (irritable bowel syndrome) 12/31/2012   History of asbestos exposure 06/27/2012   Cataract extraction status of right eye 06/25/2012   RBBB 05/05/2012   Mitral valve prolapse 05/02/2011   PCP:  Sherlene Shams, MD Pharmacy:   [facility] - [place], [place] - [address] [place] [address] Phone: [phone] Fax: [phone]     Social Determinants of Health (SDOH) Social History: SDOH Screenings   Food Insecurity: No Food Insecurity (03/17/2023)  Housing: Medium Risk (03/17/2023)  Transportation Needs: No Transportation Needs (03/17/2023)  Utilities: Not At Risk (01/27/2023)  Depression (PHQ2-9): High Risk (02/03/2023)  Financial Resource Strain: Low Risk  (03/17/2023)  Physical Activity: Unknown (03/17/2023)  Social Connections: Unknown (03/17/2023)  Stress: No Stress Concern Present (03/17/2023)  Tobacco Use: Low Risk  (04/19/2023)   SDOH Interventions:     Readmission Risk Interventions     No data to display

## 2023-04-23 NOTE — Telephone Encounter (Signed)
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  [name] [name] states patient has been falling.  [name] [name] states patient is in ED today, and they are ok releasing her, but they want her to have a psychological evaluation.  Her neurologist, Dr. Cristopher Peru, has increased her prescription for BRIVIACT 50 MG TABS from 50 MG to 100 MG. [name] [name] states he is concerned and wonders if the increase in this medication could cause a psychological change.  [name] [name] states he was told that patient was banging pots and pans and putting post-it notes up with the word "Crazy" all over the place, which is not normal for her.  [name] [name] states he believes patient just started with the new dose yesterday and the pots and pans and post-it notes started last night.  Not sure if it's the medication or the strokes causing this.  Confusion and falling has increased as the strokes have continued.  [name] [name] states he has reached out to Dr. Sherryll Burger for some feedback, but the earliest time he can have with him is a 10am video visit for tomorrow.  [name] [name] states patient has been falling, feeling faint, and feeling confused, all of which started Thursday (04/17/2023).  [facility] wants a psychological evaluation before she is released.  [name] [name] states patient has been in independent living at [facility], but they may need for her to move to another area short-term depending on the psychological evaluation.  [name] [name] would like to hear Dr. Brennan Bailey opinion.  [name] [name] states we may call him at [phone].

## 2023-04-23 NOTE — ED Notes (Signed)
TTS at bedside speaking with patients son

## 2023-04-23 NOTE — Consult Note (Signed)
  Attempted to visit with patient. She did not engage. Will retry to engage patient later.

## 2023-04-23 NOTE — ED Notes (Signed)
Medication assortment delivered to lab.   Bag number for pickup placed in chart  #[id number]

## 2023-04-23 NOTE — ED Notes (Signed)
Patient moved to hospital bed at this time. Patient not responding to staff at this time. Patient placed on monitor

## 2023-04-23 NOTE — TOC Progression Note (Signed)
Transition of Care [facility]) - Progression Note    Patient Details  Name: [name] [name] MRN: [id number] Date of Birth: 07/17/1941  Transition of Care [facility]) CM/SW Contact  Darolyn Rua, [place] Phone Number: 04/23/2023, 4:03 PM  Clinical Narrative:     [facility] has been updated that patient would not participate in their requested psych eval today, they report they will follow up tomorrow on any progress.   Expected Discharge Plan:  (TBD) Barriers to Discharge: Continued Medical Work up  Expected Discharge Plan and Services       Living arrangements for the past 2 months: Independent Living Facility ([facility])                                       Social Determinants of Health (SDOH) Interventions SDOH Screenings   Food Insecurity: No Food Insecurity (03/17/2023)  Housing: Medium Risk (03/17/2023)  Transportation Needs: No Transportation Needs (03/17/2023)  Utilities: Not At Risk (01/27/2023)  Depression (PHQ2-9): High Risk (02/03/2023)  Financial Resource Strain: Low Risk  (03/17/2023)  Physical Activity: Unknown (03/17/2023)  Social Connections: Unknown (03/17/2023)  Stress: No Stress Concern Present (03/17/2023)  Tobacco Use: Low Risk  (04/19/2023)    Readmission Risk Interventions     No data to display

## 2023-04-24 DIAGNOSIS — R462 Strange and inexplicable behavior: Secondary | ICD-10-CM

## 2023-04-24 LAB — VITAMIN B12: Vitamin B-12: 3392 pg/mL — ABNORMAL HIGH (ref 180–914)

## 2023-04-24 LAB — RPR: RPR Ser Ql: NONREACTIVE

## 2023-04-24 NOTE — TOC Transition Note (Signed)
Transition of Care [facility]) - CM/SW Discharge Note   Patient Details  Name: [name] [name] MRN: [id number] Date of Birth: August 08, 1941  Transition [facility] Care [facility]) CM/SW Contact:  Darolyn Rua, LCSW Phone Number: 04/24/2023, 9:56 AM   Clinical Narrative:     Patient to discharge back to [facility] ILF, medically and pscyh cleared, per RN back at baseline able to walk with walker. No barriers to discharge. CSW spoke with [facility] they requested FL2 incase respite is needed in the future, provided. NO additional discharge needs.   Final next level of care:  (ILF [facility]) Barriers to Discharge: No Barriers Identified   Patient Goals and CMS Choice CMS Medicare.gov Compare Post Acute Care list provided to:: Patient Represenative (must comment) ([name]) Choice offered to / list presented to : Patient  Discharge Placement                         Discharge Plan and Services Additional resources added to the After Visit Summary for                                       Social Determinants of Health (SDOH) Interventions SDOH Screenings   Food Insecurity: No Food Insecurity (03/17/2023)  Housing: Medium Risk (03/17/2023)  Transportation Needs: No Transportation Needs (03/17/2023)  Utilities: Not At Risk (01/27/2023)  Depression (PHQ2-9): High Risk (02/03/2023)  Financial Resource Strain: Low Risk  (03/17/2023)  Physical Activity: Unknown (03/17/2023)  Social Connections: Unknown (03/17/2023)  Stress: No Stress Concern Present (03/17/2023)  Tobacco Use: Low Risk  (04/19/2023)     Readmission Risk Interventions     No data to display

## 2023-04-24 NOTE — ED Provider Notes (Signed)
Patient was seen by psychiatry and was cleared.  She is medically cleared at this time as well.     Georga Hacking, MD 04/24/23 [phone]

## 2023-04-24 NOTE — NC FL2 (Signed)
[place] MEDICAID FL2 LEVEL OF CARE FORM     IDENTIFICATION  Patient Name: [name] [name] Birthdate: 1941/06/11 Sex: female Admission Date (Current Location): 04/23/2023  [facility] and [place] Number:  Chiropodist and Address:  [facility], [address] [place], [place] [address]      Provider Number: [id number]  Attending Physician Name and Address:  No att. providers found  Relative Name and Phone Number:  [name]  [phone]    Current Level of Care: Hospital Recommended Level of Care: Memory Care, Other (Comment) (short stay) Prior Approval Number:    Date Approved/Denied:   PASRR Number: [id number]  Discharge Plan: Other (Comment) (memory care short stay)    Current Diagnoses: Patient Active Problem List   Diagnosis Date Noted   Altered mental status 04/23/2023   IDA (iron deficiency anemia) 04/03/2023   CAP (community acquired pneumonia) 01/26/2023   Sepsis due to pneumonia (HCC) 01/26/2023   Polypharmacy 08/28/2022   Exposure to mold 08/28/2022   Candidiasis of anus 08/28/2022   Right groin pain 06/26/2022   Lumbar radiculitis 05/09/2022   Right sided sciatica 04/10/2022   Anemia of chronic disease 04/02/2022   DDD (degenerative disc disease), lumbar 03/31/2022   Spondylosis 03/31/2022   At high risk for falls 03/31/2022   Chronic kidney disease (CKD) stage G3a/A2, moderately decreased glomerular filtration rate (GFR) between 45-59 mL/min/1.73 square meter and albuminuria creatinine ratio between 30-299 mg/g (HCC) 11/13/2021   Do not resuscitate status 08/14/2021   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   Elevated serum lactate dehydrogenase (LDH) 08/01/2021   PAF (paroxysmal atrial fibrillation) (HCC) 06/19/2021   Osteoporosis 06/19/2021   Seizure disorder (HCC) 06/19/2021   Hyperlipidemia 06/19/2021   Concern about end of life 04/24/2021   Chronic bilateral low back pain without  sciatica 04/12/2021   Fall as cause of accidental injury at home as place of occurrence 04/12/2021   Acquired thrombophilia (HCC) 06/14/2020   Carotid stenosis 04/21/2020   Inguinal hernia of right side without obstruction or gangrene 02/09/2020   Sinus node dysfunction (HCC) 07/02/2019   Intracranial atherosclerosis 10/06/2018   Focal seizures (HCC) 05/14/2018   Arthritis of both hands 12/14/2017   Allergy to environmental factors 08/27/2017   Anemia in chronic kidney disease 02/17/2017   Visit for preventive health examination 05/23/2016   Hepatic cyst 05/04/2016   Chronic systolic congestive heart failure (HCC) 05/03/2016   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   H/O mitral valve replacement 10/31/2015   S/P Minimally invasive maze operation for atrial fibrillation 10/31/2015   Atrial fibrillation, persistent (HCC)    Restrictive lung disease 09/12/2015   Pulmonary hypertension (HCC) 08/08/2015   Nodule of right lung 08/04/2015   Thoracic aorta atherosclerosis (HCC) 02/05/2015   Family history of cerebral aneurysm 08/02/2014   Diverticulosis of colon without hemorrhage 01/09/2014   Hospital discharge follow-up 04/15/2013   Thrombocytopenia (HCC) 02/04/2013   IBS (irritable bowel syndrome) 12/31/2012   History of asbestos exposure 06/27/2012   Cataract extraction status of right eye 06/25/2012   RBBB 05/05/2012   Mitral valve prolapse 05/02/2011    Orientation RESPIRATION BLADDER Height & Weight     Self, Time, Situation, Place  Normal Continent Weight:   Height:     BEHAVIORAL SYMPTOMS/MOOD NEUROLOGICAL BOWEL NUTRITION STATUS      Continent Diet (normal)  AMBULATORY STATUS COMMUNICATION OF NEEDS Skin   Independent Verbally Normal  Personal Care Assistance Level of Assistance  Bathing, Feeding, Dressing, Total care Bathing Assistance: Independent Feeding assistance: Independent Dressing Assistance: Independent Total Care Assistance:  Independent   Functional Limitations Info  Hearing, Speech, Sight Sight Info: Adequate Hearing Info: Adequate Speech Info: Adequate    SPECIAL CARE FACTORS FREQUENCY                       Contractures Contractures Info: Not present    Additional Factors Info  Code Status, Allergies Code Status Info: full Allergies Info: Codeine  Epinephrine  Hydrocodone  Hydromorphone  Molds & Smuts  Oxycodone  Meloxicam  Codeine  Dextromethorphan  Diphen (Diphenhydramine Hcl)  Diphenhydramine  Diphenhydramine Hcl  Diphenylpyraline  Doxycycline  Doxycycline  Hydrocodone  Hydromorphone  Meloxicam  Mobic (Meloxicam)  Nsaids  Nsaids  Oxycodone  Propofol  Propofol           Current Medications (04/24/2023):  This is the current hospital active medication list Current Facility-Administered Medications  Medication Dose Route Frequency Provider Last Rate Last Admin   apixaban (ELIQUIS) tablet 5 mg  5 mg Oral BID Sharman Cheek, MD   5 mg at 04/23/23 2221   brivaracetam (BRIVIACT) tablet 100 mg  100 mg Oral QHS Sharen Hones, RPH   100 mg at 04/23/23 2223   brivaracetam (BRIVIACT) tablet 50 mg  50 mg Oral Daily Sharen Hones, RPH       calcium carbonate (OS-CAL - dosed in mg of elemental calcium) tablet 1,250 mg  1,250 mg Oral BID WC Sharman Cheek, MD       cyanocobalamin (VITAMIN B12) tablet 1,000 mcg  1,000 mcg Oral Daily Sharman Cheek, MD   1,000 mcg at 04/23/23 2222   furosemide (LASIX) tablet 40 mg  40 mg Oral Once per day on Mon Thu Stafford, Aneta Mins, MD       gabapentin (NEURONTIN) capsule 200 mg  200 mg Oral TID Sharman Cheek, MD   200 mg at 04/23/23 2223   lamoTRIgine (LAMICTAL) tablet 150 mg  150 mg Oral Daily Sharman Cheek, MD       lamoTRIgine (LAMICTAL) tablet 200 mg  200 mg Oral QHS Sharman Cheek, MD   200 mg at 04/23/23 2222   losartan (COZAAR) tablet 12.5 mg  12.5 mg Oral Daily Sharman Cheek, MD       metoprolol succinate (TOPROL-XL) 24 hr tablet 25  mg  25 mg Oral Daily Sharman Cheek, MD   25 mg at 04/23/23 2221   montelukast (SINGULAIR) tablet 10 mg  10 mg Oral Murray Hodgkins, MD   10 mg at 04/23/23 2223   spironolactone (ALDACTONE) tablet 12.5 mg  12.5 mg Oral Ayesha Mohair, MD       Current Outpatient Medications  Medication Sig Dispense Refill   BRIVIACT 50 MG TABS Take 50-100 mg by mouth 2 (two) times daily. 50 mg in AM, 100 mg HS     butalbital-acetaminophen-caffeine (FIORICET) 50-325-40 MG tablet TAKE 1 TABLET BY MOUTH ONCE DAILY AS NEEDED FOR BACK PAIN OR MIGRAINE MAX OF 3 TABS PER DAY 90 tablet 0   calcium carbonate (CALCIUM 600) 600 MG TABS tablet 600 mg 2 (two) times daily with a meal.     Cholecalciferol (VITAMIN D) 2000 units CAPS Take 2,000 Units by mouth daily.     ELIQUIS 5 MG TABS tablet TAKE ONE TABLET TWICE DAILY (Patient taking differently: Take 5 mg by mouth 2 (two) times daily.) 180 tablet 2  fluticasone (FLONASE) 50 MCG/ACT nasal spray USE 2 SPRAYS IN BOTH NOSTRILS DAILY (Patient taking differently: Place 2 sprays into both nostrils daily.) 16 g 11   folic acid (FOLVITE) 1 MG tablet Take 1 mg by mouth daily.     furosemide (LASIX) 40 MG tablet TAKE ONE (1) TABLET BY MOUTH TWICE PER WEEK; TAKE AN ADDITIONAL HALF (1/2) TO ONE (1) TABLET AS NEEDED FOR FLUID RETENTION (Patient taking differently: Take 40 mg by mouth 2 (two) times a week. TAKE ONE (1) TABLET BY MOUTH TWICE PER WEEK; TAKE AN ADDITIONAL HALF (1/2) TO ONE (1) TABLET AS NEEDED FOR FLUID RETENTION) 90 tablet 0   gabapentin (NEURONTIN) 100 MG capsule Take 200 mg by mouth 3 (three) times daily.     lamoTRIgine (LAMICTAL) 150 MG tablet Take 150 mg by mouth daily.     lamoTRIgine (LAMICTAL) 200 MG tablet Take 200 mg by mouth at bedtime.     losartan (COZAAR) 25 MG tablet Take 0.5 tablets (12.5 mg total) by mouth daily. 45 tablet 3   metoprolol succinate (TOPROL-XL) 25 MG 24 hr tablet TAKE ONE TABLET (25 MG) BY MOUTH EVERY DAY (Patient taking  differently: Take 25 mg by mouth daily.) 30 tablet 0   montelukast (SINGULAIR) 10 MG tablet TAKE ONE TABLET BY MOUTH AT BEDTIME (Patient taking differently: Take 10 mg by mouth at bedtime.) 90 tablet 1   polyethylene glycol powder (GLYCOLAX/MIRALAX) 17 GM/SCOOP powder Take 1 Dose by mouth daily.     rosuvastatin (CRESTOR) 40 MG tablet Take 1 tablet (40 mg total) by mouth daily. 90 tablet 3   spironolactone (ALDACTONE) 25 MG tablet Take 0.5 tablets (12.5 mg total) by mouth every other day. 45 tablet 0   VENTOLIN HFA 108 (90 Base) MCG/ACT inhaler INHALE 2 PUFFS INTO THE LUNGS EVERY 6 HOURS AS NEEDED FOR WHEEZING OR SHORTNESS OF BREATH (Patient taking differently: Inhale 2 puffs into the lungs every 6 (six) hours as needed for wheezing or shortness of breath.) 18 g 1   vitamin B-12 (CYANOCOBALAMIN) 1000 MCG tablet Take 1,000 mcg by mouth daily.     metaxalone (SKELAXIN) 800 MG tablet TAKE ONE TABLET BY MOUTH 3 TIMES DAILY (Patient not taking: Reported on 04/23/2023) 90 tablet 1     Discharge Medications: Please see discharge summary for a list of discharge medications.  Relevant Imaging Results:  Relevant Lab Results:   Additional Information SSN: [id number]  Darolyn Rua, LCSW

## 2023-04-24 NOTE — ED Notes (Signed)
Tele psych at the bedside.

## 2023-04-24 NOTE — Consult Note (Addendum)
Telepsych Consultation   Reason for Consult:  Psychiatric evaluation Referring Physician: Imagene Gurney  Location of Patient: [facility] Location of Provider: Other: [facility]  Patient Identification: [name] [name] MRN:  [id number] Principal Diagnosis: Behavior concern Diagnosis:  Active Problems:   Altered mental status   Total Time spent with patient: 15 minutes  Subjective:   [name] [name] is a 82 y.o. female patient admitted with no pertinent psychiatric history who presented voluntarily to [facility] for AMS and abnormal behavior.  Patient was seen via video teleconference and chart reviewed.  On evaluation, patient is alert, oriented x 4, and cooperative. Speech is clear, normal rate, and coherent. Pt appears in scrubs. Eye contact is good. Mood is euthymic, affect is congruent with mood. Thought process is coherent and thought content is WDL. Pt denies SI/HI/AVH or paranoia. There is no objective indication that the patient is responding to internal stimuli. No delusions elicited during this assessment.    Patient reports "I live at an independent living facility called [facility], I don't really know what's going on, but I suspect they want me to move somewhere else, and I'm not feeling good about it, because its looked down upon by other residents".  Patient denies SI, denies HI, denies AVH or paranoia.  Patient reports she is managed by her PCP Dr. Sherrie Mustache at [facility] and is prescribed medications namely:Spironolactone, Losartan, Metoprolol, Montelukast, Lamotrigine, Gabapentin and  Briviact.  Patient reports her medications may be unbalanced, and her Briviact was recently adjusted for one day on friday because she had a seizure.  Patient denies a history of inpatient psychiatric hospitalizations.  Support, encouragement and reassurance provided about ongoing stressors. Patient provided with opportunity for questions.   Dispo: Patient psych cleared, she is not psychotic or a  danger to herself or others. Patient does not meet criteria for inpatient psychiatric hospitalization or IVC.  Recommend follow up with outpatient provider.    HPI:  Per EDP note  04/23/23 "Patient is presenting with several week history of abnormal behavior. Per chart review, she has been seen in the ED on 5/16 and 5/18 for falls.  She states that the first time, she had a mechanical fall and then went to sit and felt that the entire lower body from her axilla down was numb and wobbly.  Then on 5/18, she was concerned for a breakthrough seizure.  On 5/20, her neurologist Dr. Clelia Croft increased her Briviact.  Since then, patient has had multiple bizarre events.  Please see PCPs telephone note from today.  Today, she became mute and "unresponsive" after being told she must go to her SNF.  Since then, patient has returned back to her baseline, stating that she was just upset.  Overall, differential includes adverse side effect from Briviact versus functional neurological disorder versus primary psychological disorder.  Patient has refused SNF placement if indicated and states she already has home health set up at home ".   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 Ladona Ridgel   Cataract    Left eye   Chronic kidney disease (CKD) stage G3a/A2, moderately decreased glomerular filtration rate (GFR) between 45-59 mL/min/1.73 square meter and albuminuria creatinine  ratio between 30-299 mg/g (HCC) 11/13/2021   Closed bilateral fracture of pubic rami (HCC) 08/28/2022   Diverticulosis    Focal seizures (HCC)    Heart murmur    Hematuria, gross 04/09/2018   Hemorrhoid    Hepatic cyst    innumerable   History of asbestos exposure    Hyperlipidemia    IBS (irritable bowel syndrome)    Idiopathic thrombocytopenic purpura (ITP) (HCC)    Migraine    MVP (mitral valve prolapse)    Near syncope 01/26/2023   Nodule of right lung    Osteoporosis of forearm    RBBB    Restrictive lung disease    Mild on PFT & likely cardiac in etiology    Right sided sciatica 04/10/2022   S/P Minimally invasive maze operation for atrial fibrillation 10/31/2015   Complete bilateral atrial lesion set using cryothermy and bipolar radiofrequency ablation with clipping of LA appendage via right mini thoracotomy approach   S/P minimally invasive mitral valve replacement with bioprosthetic valve 10/31/2015   33 mm [facility] Mitral bovine bioprosthetic tissue valve placed via right mini thoracotomy approach   Seizures (HCC)    left foot paralysis and left hand paralysis Dr. 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   SVT (supraventricular tachycardia)    Thoracic aorta atherosclerosis (HCC)    TIA (transient ischemic attack)    Visit for preventive health examination 05/23/2016    Past Surgical History:  Procedure Laterality Date   BREAST EXCISIONAL BIOPSY Right Late 80s   Negative X2   CARDIAC CATHETERIZATION N/A 10/18/2015   Procedure: Right/Left Heart Cath and Coronary Angiography;  Surgeon: Tonny Bollman, MD;  Location: [facility] INVASIVE CV LAB;  Service: Cardiovascular;  Laterality: N/A;   CARDIOVERSION N/A 08/22/2015   Procedure: CARDIOVERSION;  Surgeon: Vesta Mixer, MD;  Location: [facility] ENDOSCOPY;  Service: Cardiovascular;  Laterality: N/A;   COLONOSCOPY  2003   EP IMPLANTABLE DEVICE N/A 11/06/2015   Procedure:  Pacemaker Implant;  Surgeon: Marinus Maw, MD;  Location: [facility] INVASIVE CV LAB;  Service: Cardiovascular;  Laterality: N/A;   EP IMPLANTABLE DEVICE N/A 02/20/2016   Procedure: Lead Extraction;  Surgeon: Marinus Maw, MD;  Location: [facility] INVASIVE CV LAB;  Service: Cardiovascular;  Laterality: N/A;   EYE SURGERY Right 2013   FEMUR IM NAIL Left 06/18/2021   Procedure: INTRAMEDULLARY (IM) NAIL FEMORAL, OPEN REDUCTION INTERNAL FIXATION FEMUR RIGHT LITTLE FINGER PIP CLOSED REDUCTION;  Surgeon: Myrene Galas, MD;  Location: [facility] OR;  Service: Orthopedics;  Laterality: Left;   FOOT SURGERY     ~2007 right foot bunion   MANDIBLE FRACTURE SURGERY  03/26/2013   MINIMALLY INVASIVE MAZE PROCEDURE N/A 10/31/2015   Procedure: MINIMALLY INVASIVE MAZE PROCEDURE;  Surgeon: Purcell Nails, MD;  Location: [facility] OR;  Service: Open Heart Surgery;  Laterality: N/A;   MITRAL VALVE REPLACEMENT Right 10/31/2015   Procedure: MINIMALLY INVASIVE MITRAL VALVE (MV) REPLACEMENT;  Surgeon: Purcell Nails, MD;  Location: [facility] OR;  Service: Open Heart Surgery;  Laterality: Right;   PACEMAKER LEAD REMOVAL  02/20/2016   TEE  WITH CARDIOVERSION     TEE WITHOUT CARDIOVERSION N/A 08/22/2015   Procedure: TRANSESOPHAGEAL ECHOCARDIOGRAM (TEE);  Surgeon: Vesta Mixer, MD;  Location: [facility] ENDOSCOPY;  Service: Cardiovascular;  Laterality: N/A;   TEE WITHOUT CARDIOVERSION N/A 10/31/2015   Procedure: TRANSESOPHAGEAL ECHOCARDIOGRAM (TEE);  Surgeon: Purcell Nails, MD;  Location: [facility] OR;  Service: Open Heart Surgery;  Laterality: N/A;   TUBAL LIGATION     VARICOSE VEIN SURGERY Right    Family History:  Family History  Problem Relation Age of Onset   Hypertension Mother    Arrhythmia Mother    Heart failure Mother    Arrhythmia Brother    Stroke Brother 68       cerebral hemorrhage, nonsmoker, no HTN   Prostate cancer Brother    Stroke Father        from an aneurysm   Stroke Maternal Aunt 83       cerebral hemorrhage   Liver cancer  Maternal Grandmother    Atrial fibrillation Son    Celiac disease Son    Heart attack Neg Hx    Breast cancer Neg Hx    Family Psychiatric  History: N/A Social History:  Social History   Substance and Sexual Activity  Alcohol Use No   Alcohol/week: 3.0 standard drinks of alcohol   Types: 3 Standard drinks or equivalent per week     Social History   Substance and Sexual Activity  Drug Use No    Social History   Socioeconomic History   Marital status: Widowed    Spouse name: Deceased   Number of children: 2   Years of education: 22   Highest education level: Master's degree (e.g., MA, MS, MEng, MEd, MSW, MBA)  Occupational History   Occupation: Retired  Tobacco Use   Smoking status: Never   Smokeless tobacco: Never  Vaping Use   Vaping Use: Never used  Substance and Sexual Activity   Alcohol use: No    Alcohol/week: 3.0 standard drinks of alcohol    Types: 3 Standard drinks or equivalent per week   Drug use: No   Sexual activity: Not Currently  Other Topics Concern   Not on file  Social History Narrative   ** Merged History Encounter **       She is a widow.  Husband died from metastatic renal cell cancer. Originally from [place]. Previously lived in [place] from 1975-[place]. Moved to  in 2007. No mold exposure recently but did have it through a prior work exposure in 1993. Has a masters in    public health. No bird exposure.  [facility] (09/29/17): She has moved into a retirement community since last appointment. She reports she had testing at her new residence that was positive for mold. It has since been treated.    Social Determinants of Health   Financial Resource Strain: Low Risk  (03/17/2023)   Overall Financial Resource Strain (CARDIA)    Difficulty of Paying Living Expenses: Not hard at all  Food Insecurity: No Food Insecurity (03/17/2023)   Hunger Vital Sign    Worried About Running Out of Food in the Last Year: Never true    Ran Out of Food  in the Last Year: Never true  Transportation Needs: No Transportation Needs (03/17/2023)   PRAPARE - Administrator, Civil Service (Medical): No    Lack of Transportation (Non-Medical): No  Physical Activity: Unknown (03/17/2023)   Exercise Vital Sign    Days of Exercise per  Week: Patient declined    Minutes of Exercise per Session: Not on file  Stress: No Stress Concern Present (03/17/2023)   [employer] of Occupational Health - Occupational Stress Questionnaire    Feeling of Stress : Only a little  Social Connections: Unknown (03/17/2023)   Social Connection and Isolation Panel [NHANES]    Frequency of Communication with Friends and Family: Patient declined    Frequency of Social Gatherings with Friends and Family: Once a week    Attends Religious Services: Patient declined    Database administrator or Organizations: Yes    Attends Banker Meetings: Patient declined    Marital Status: Widowed   Additional Social History:    Allergies:   Allergies  Allergen Reactions   Codeine Nausea And Vomiting and Other (See Comments)    migraine   Epinephrine Palpitations and Shortness Of Breath   Hydrocodone Nausea And Vomiting and Other (See Comments)    MIGRAINE   Hydromorphone Nausea And Vomiting and Other (See Comments)    migraine   Molds & Smuts Anxiety, Other (See Comments) and Shortness Of Breath   Oxycodone Nausea And Vomiting and Other (See Comments)    Severe migraine   Meloxicam Other (See Comments)    Severe reflux   Codeine Other (See Comments)    Migraine   Dextromethorphan Other (See Comments)    seizures   Diphen [Diphenhydramine Hcl] Other (See Comments)    seizure   Diphenhydramine Other (See Comments)    seizure   Diphenhydramine Hcl     Other reaction(s): Other (See Comments)   Diphenylpyraline Other (See Comments)   Doxycycline Itching, Swelling and Other (See Comments)    Facial   Doxycycline Itching and Swelling   Hydrocodone  Other (See Comments)    Migraine   Hydromorphone Other (See Comments)    Migraine   Meloxicam Other (See Comments)   Mobic [Meloxicam] Other (See Comments)    reflux   Nsaids    Nsaids Other (See Comments)    Pt on blood thinner   Oxycodone Other (See Comments)    Migraine   Propofol Other (See Comments)   Propofol Other (See Comments)    Very sensitive; patient stated she was told she was told she had apnea    Labs:  Results for orders placed or performed during the hospital encounter of 04/23/23 (from the past 48 hour(s))  Troponin I (High Sensitivity)     Status: None   Collection Time: 04/23/23  1:26 AM  Result Value Ref Range   Troponin I (High Sensitivity) 10 <18 ng/L    Comment: (NOTE) Elevated high sensitivity troponin I (hsTnI) values and significant  changes across serial measurements may suggest ACS but many other  chronic and acute conditions are known to elevate hsTnI results.  Refer to the "Links" section for chest pain algorithms and additional  guidance. Performed at [facility], [address] [place], [place] [address]   CBC with Differential/Platelet     Status: Abnormal   Collection Time: 04/23/23  1:27 AM  Result Value Ref Range   WBC 4.3 4.0 - 10.5 K/uL   RBC 3.28 (L) 3.87 - 5.11 MIL/uL   Hemoglobin 9.8 (L) 12.0 - 15.0 g/dL   HCT 60.4 (L) 54.0 - 98.1 %   MCV 95.1 80.0 - 100.0 fL   MCH 29.9 26.0 - 34.0 pg   MCHC 31.4 30.0 - 36.0 g/dL   RDW 19.1 (H) 47.8 - 29.5 %  Platelets 107 (L) 150 - 400 K/uL   nRBC 0.0 0.0 - 0.2 %   Neutrophils Relative % 71 %   Neutro Abs 3.1 1.7 - 7.7 K/uL   Lymphocytes Relative 17 %   Lymphs Abs 0.7 0.7 - 4.0 K/uL   Monocytes Relative 7 %   Monocytes Absolute 0.3 0.1 - 1.0 K/uL   Eosinophils Relative 3 %   Eosinophils Absolute 0.1 0.0 - 0.5 K/uL   Basophils Relative 1 %   Basophils Absolute 0.0 0.0 - 0.1 K/uL   Immature Granulocytes 1 %   Abs Immature Granulocytes 0.02 0.00 - 0.07 K/uL    Comment:  Performed at [facility], [address] [place], [place] [address]  Comprehensive metabolic panel     Status: Abnormal   Collection Time: 04/23/23  1:27 AM  Result Value Ref Range   Sodium 136 135 - 145 mmol/L   Potassium 4.2 3.5 - 5.1 mmol/L   Chloride 100 98 - 111 mmol/L   CO2 28 22 - 32 mmol/L   Glucose, Bld 113 (H) 70 - 99 mg/dL    Comment: Glucose reference range applies only to samples taken after fasting for at least 8 hours.   BUN 27 (H) 8 - 23 mg/dL   Creatinine, Ser 6.04 (H) 0.44 - 1.00 mg/dL   Calcium 9.0 8.9 - 54.0 mg/dL   Total Protein 6.5 6.5 - 8.1 g/dL   Albumin 4.2 3.5 - 5.0 g/dL   AST 50 (H) 15 - 41 U/L   ALT 27 0 - 44 U/L   Alkaline Phosphatase 111 38 - 126 U/L   Total Bilirubin 1.0 0.3 - 1.2 mg/dL   GFR, Estimated 43 (L) >60 mL/min    Comment: (NOTE) Calculated using the CKD-EPI Creatinine Equation (2021)    Anion gap 8 5 - 15    Comment: Performed at [facility], [address] [place], [place] [address]  Urinalysis, Routine w reflex microscopic -Urine, Catheterized     Status: Abnormal   Collection Time: 04/23/23  1:28 AM  Result Value Ref Range   Color, Urine YELLOW (A) YELLOW   APPearance CLEAR (A) CLEAR   Specific Gravity, Urine 1.014 1.005 - 1.030   pH 7.0 5.0 - 8.0   Glucose, UA NEGATIVE NEGATIVE mg/dL   Hgb urine dipstick NEGATIVE NEGATIVE   Bilirubin Urine NEGATIVE NEGATIVE   Ketones, ur NEGATIVE NEGATIVE mg/dL   Protein, ur 30 (A) NEGATIVE mg/dL   Nitrite NEGATIVE NEGATIVE   Leukocytes,Ua NEGATIVE NEGATIVE   RBC / HPF 0-5 0 - 5 RBC/hpf   WBC, UA 0-5 0 - 5 WBC/hpf   Bacteria, UA NONE SEEN NONE SEEN   Squamous Epithelial / HPF NONE SEEN 0 - 5 /HPF   Hyaline Casts, UA PRESENT     Comment: Performed at [facility], [address] [place], [place] [address]  Urine Drug Screen, Qualitative ([facility] only)     Status: None   Collection Time: 04/23/23  1:28 AM  Result Value Ref Range   Tricyclic, Ur Screen NONE  DETECTED NONE DETECTED   Amphetamines, Ur Screen NONE DETECTED NONE DETECTED   MDMA (Ecstasy)Ur Screen NONE DETECTED NONE DETECTED   Cocaine Metabolite,Ur [place] NONE DETECTED NONE DETECTED   Opiate, Ur Screen NONE DETECTED NONE DETECTED   Phencyclidine (PCP) Ur S NONE DETECTED NONE DETECTED   Cannabinoid 50 Ng, Ur [place] NONE DETECTED NONE DETECTED   Barbiturates, Ur Screen NONE DETECTED NONE DETECTED   Benzodiazepine,  Ur Scrn NONE DETECTED NONE DETECTED   Methadone Scn, Ur NONE DETECTED NONE DETECTED    Comment: (NOTE) Tricyclics + metabolites, urine    Cutoff 1000 ng/mL Amphetamines + metabolites, urine  Cutoff 1000 ng/mL MDMA (Ecstasy), urine              Cutoff 500 ng/mL Cocaine Metabolite, urine          Cutoff 300 ng/mL Opiate + metabolites, urine        Cutoff 300 ng/mL Phencyclidine (PCP), urine         Cutoff 25 ng/mL Cannabinoid, urine                 Cutoff 50 ng/mL Barbiturates + metabolites, urine  Cutoff 200 ng/mL Benzodiazepine, urine              Cutoff 200 ng/mL Methadone, urine                   Cutoff 300 ng/mL  The urine drug screen provides only a preliminary, unconfirmed analytical test result and should not be used for non-medical purposes. Clinical consideration and professional judgment should be applied to any positive drug screen result due to possible interfering substances. A more specific alternate chemical method must be used in order to obtain a confirmed analytical result. Gas chromatography / mass spectrometry (GC/MS) is the preferred confirm atory method. Performed at [facility], [address] [place], [place] [address]   Troponin I (High Sensitivity)     Status: None   Collection Time: 04/23/23  4:42 AM  Result Value Ref Range   Troponin I (High Sensitivity) 11 <18 ng/L    Comment: (NOTE) Elevated high sensitivity troponin I (hsTnI) values and significant  changes across serial measurements may suggest ACS but many other  chronic and  acute conditions are known to elevate hsTnI results.  Refer to the "Links" section for chest pain algorithms and additional  guidance. Performed at [facility], [address] [place], [place] [address]    *Note: Due to a large number of results and/or encounters for the requested time period, some results have not been displayed. A complete set of results can be found in Results Review.    Medications:  Current Facility-Administered Medications  Medication Dose Route Frequency Provider Last Rate Last Admin   apixaban (ELIQUIS) tablet 5 mg  5 mg Oral BID Sharman Cheek, MD   5 mg at 04/23/23 2221   brivaracetam (BRIVIACT) tablet 100 mg  100 mg Oral QHS Sharen Hones, RPH   100 mg at 04/23/23 2223   brivaracetam (BRIVIACT) tablet 50 mg  50 mg Oral Daily Sharen Hones, RPH       calcium carbonate (OS-CAL - dosed in mg of elemental calcium) tablet 1,250 mg  1,250 mg Oral BID WC Sharman Cheek, MD       cyanocobalamin (VITAMIN B12) tablet 1,000 mcg  1,000 mcg Oral Daily Sharman Cheek, MD   1,000 mcg at 04/23/23 2222   furosemide (LASIX) tablet 40 mg  40 mg Oral Once per day on Mon Thu Stafford, Aneta Mins, MD       gabapentin (NEURONTIN) capsule 200 mg  200 mg Oral TID Sharman Cheek, MD   200 mg at 04/23/23 2223   lamoTRIgine (LAMICTAL) tablet 150 mg  150 mg Oral Daily Sharman Cheek, MD       lamoTRIgine (LAMICTAL) tablet 200 mg  200 mg Oral QHS Sharman Cheek, MD   200  mg at 04/23/23 2222   losartan (COZAAR) tablet 12.5 mg  12.5 mg Oral Daily Sharman Cheek, MD       metoprolol succinate (TOPROL-XL) 24 hr tablet 25 mg  25 mg Oral Daily Sharman Cheek, MD   25 mg at 04/23/23 2221   montelukast (SINGULAIR) tablet 10 mg  10 mg Oral Murray Hodgkins, MD   10 mg at 04/23/23 2223   spironolactone (ALDACTONE) tablet 12.5 mg  12.5 mg Oral Ayesha Mohair, MD       Current Outpatient Medications  Medication Sig Dispense Refill   BRIVIACT 50 MG  TABS Take 50-100 mg by mouth 2 (two) times daily. 50 mg in AM, 100 mg HS     butalbital-acetaminophen-caffeine (FIORICET) 50-325-40 MG tablet TAKE 1 TABLET BY MOUTH ONCE DAILY AS NEEDED FOR BACK PAIN OR MIGRAINE MAX OF 3 TABS PER DAY 90 tablet 0   calcium carbonate (CALCIUM 600) 600 MG TABS tablet 600 mg 2 (two) times daily with a meal.     Cholecalciferol (VITAMIN D) 2000 units CAPS Take 2,000 Units by mouth daily.     ELIQUIS 5 MG TABS tablet TAKE ONE TABLET TWICE DAILY (Patient taking differently: Take 5 mg by mouth 2 (two) times daily.) 180 tablet 2   fluticasone (FLONASE) 50 MCG/ACT nasal spray USE 2 SPRAYS IN BOTH NOSTRILS DAILY (Patient taking differently: Place 2 sprays into both nostrils daily.) 16 g 11   folic acid (FOLVITE) 1 MG tablet Take 1 mg by mouth daily.     furosemide (LASIX) 40 MG tablet TAKE ONE (1) TABLET BY MOUTH TWICE PER WEEK; TAKE AN ADDITIONAL HALF (1/2) TO ONE (1) TABLET AS NEEDED FOR FLUID RETENTION (Patient taking differently: Take 40 mg by mouth 2 (two) times a week. TAKE ONE (1) TABLET BY MOUTH TWICE PER WEEK; TAKE AN ADDITIONAL HALF (1/2) TO ONE (1) TABLET AS NEEDED FOR FLUID RETENTION) 90 tablet 0   gabapentin (NEURONTIN) 100 MG capsule Take 200 mg by mouth 3 (three) times daily.     lamoTRIgine (LAMICTAL) 150 MG tablet Take 150 mg by mouth daily.     lamoTRIgine (LAMICTAL) 200 MG tablet Take 200 mg by mouth at bedtime.     losartan (COZAAR) 25 MG tablet Take 0.5 tablets (12.5 mg total) by mouth daily. 45 tablet 3   metoprolol succinate (TOPROL-XL) 25 MG 24 hr tablet TAKE ONE TABLET (25 MG) BY MOUTH EVERY DAY (Patient taking differently: Take 25 mg by mouth daily.) 30 tablet 0   montelukast (SINGULAIR) 10 MG tablet TAKE ONE TABLET BY MOUTH AT BEDTIME (Patient taking differently: Take 10 mg by mouth at bedtime.) 90 tablet 1   polyethylene glycol powder (GLYCOLAX/MIRALAX) 17 GM/SCOOP powder Take 1 Dose by mouth daily.     rosuvastatin (CRESTOR) 40 MG tablet Take 1  tablet (40 mg total) by mouth daily. 90 tablet 3   spironolactone (ALDACTONE) 25 MG tablet Take 0.5 tablets (12.5 mg total) by mouth every other day. 45 tablet 0   VENTOLIN HFA 108 (90 Base) MCG/ACT inhaler INHALE 2 PUFFS INTO THE LUNGS EVERY 6 HOURS AS NEEDED FOR WHEEZING OR SHORTNESS OF BREATH (Patient taking differently: Inhale 2 puffs into the lungs every 6 (six) hours as needed for wheezing or shortness of breath.) 18 g 1   vitamin B-12 (CYANOCOBALAMIN) 1000 MCG tablet Take 1,000 mcg by mouth daily.     metaxalone (SKELAXIN) 800 MG tablet TAKE ONE TABLET BY MOUTH 3 TIMES DAILY (Patient not taking: Reported  on 04/23/2023) 90 tablet 1    Musculoskeletal: Strength & Muscle Tone: within normal limits Gait & Station:  UTA Patient leans: N/A   Psychiatric Specialty Exam:  Presentation  General Appearance:  Fairly Groomed  Eye Contact: Good  Speech: Clear and Coherent  Speech Volume: Normal  Handedness: Right   Mood and Affect  Mood: Euthymic  Affect: Congruent   Thought Process  Thought Processes: Coherent  Descriptions of Associations:Intact  Orientation:Full (Time, Place and Person)  Thought Content:WDL  History of Schizophrenia/Schizoaffective disorder:No data recorded Duration of Psychotic Symptoms:No data recorded Hallucinations:Hallucinations: None  Ideas of Reference:None  Suicidal Thoughts:Suicidal Thoughts: No  Homicidal Thoughts:Homicidal Thoughts: No   Sensorium  Memory: Immediate Good  Judgment: Fair  Insight: Good   Executive Functions  Concentration: Good  Attention Span: Good  Recall: Good  Fund of Knowledge: Good  Language: Good   Psychomotor Activity  Psychomotor Activity: Psychomotor Activity: Normal   Assets  Assets: Communication Skills; Desire for Improvement; Social Support; Resilience   Sleep  Sleep: Sleep: Fair    Physical Exam: Physical Exam Constitutional:      General: She is not in  acute distress.    Appearance: She is not diaphoretic.  HENT:     Head: Normocephalic.     Right Ear: External ear normal.     Left Ear: External ear normal.     Nose: No congestion.  Eyes:     General:        Right eye: No discharge.        Left eye: No discharge.  Pulmonary:     Effort: No respiratory distress.  Chest:     Chest wall: No tenderness.  Neurological:     Mental Status: She is alert and oriented to person, place, and time.  Psychiatric:        Attention and Perception: Attention and perception normal.        Mood and Affect: Mood and affect normal.        Speech: Speech normal.        Behavior: Behavior is cooperative.        Thought Content: Thought content normal. Thought content is not paranoid or delusional. Thought content does not include homicidal or suicidal ideation. Thought content does not include homicidal or suicidal plan.        Cognition and Memory: Cognition and memory normal.    Review of Systems  Constitutional:  Negative for chills, diaphoresis and fever.  HENT:  Negative for congestion.   Eyes:  Negative for discharge.  Respiratory:  Negative for cough, shortness of breath and wheezing.   Cardiovascular:  Negative for chest pain and palpitations.  Gastrointestinal:  Negative for diarrhea, nausea and vomiting.  Neurological:  Negative for seizures, weakness and headaches.  Psychiatric/Behavioral:  Negative for depression, hallucinations, memory loss, substance abuse and suicidal ideas. The patient is not nervous/anxious and does not have insomnia.    Blood pressure 122/78, pulse 78, temperature 98 F (36.7 C), temperature source Oral, resp. rate 18, SpO2 98 %. There is no height or weight on file to calculate BMI.  Treatment Plan Summary: Plan Psych cleared.  Disposition: No evidence of imminent risk to self or others at present.   Patient does not meet criteria for psychiatric inpatient admission. Supportive therapy provided about ongoing  stressors. Discussed crisis plan, support from social network, calling 911, coming to the Emergency Department, and calling Suicide Hotline.  Recommendation communicated to EDP Dr Augusto Gamble via Epic  secure chat at 3:37 am today.  This service was provided via telemedicine using a 2-way, interactive audio and video technology.  Names of all persons participating in this telemedicine service and their role in this encounter. Name: [name] Role: Patient  Name: Denton Brick Role: Nurse Practitioner  Name: Georga Hacking Role: EDP  Name:  Role:     Mancel Bale, NP 04/24/2023 4:50 AM

## 2023-04-24 NOTE — ED Notes (Signed)
TTS @ the bedside 

## 2023-04-24 NOTE — Consult Note (Signed)
  Patient A&O x 4 today and willing to engage, which contrasts with her presentation yesterday. She is noted sitting in the hall eating breakfast. Patient reports that she will be returning to [facility] independent living upon discharge today. She states that she didn't engage with staff yesterday due to being ashamed of her circumstances. She reports later becoming angry and refusing to engage with anyone until "they were going to kill me when an MRI". Patient reports having a pacemaker so she decided to break her silence. Patient also states that she thought cameras where in her room as to why she stayed silent when her son was present. Patient laughed while sharing this information, stating she was going to commend a member of her family for handling the situation so well. Patient states "I have to learn not to be so stubborn". She denies SI/HI/AVH/paranoia. She also denies a hx of psychiatric dx although her medical record indicates depression and anxiety.

## 2023-04-24 NOTE — ED Notes (Signed)
Bag #[id number] with personal meds returned to patient's son after being picked up at pharmacy

## 2023-04-24 NOTE — ED Notes (Signed)
Patient supine on stretcher with respirations even and non labored, patient without signs of distress. Patient denies needs at this time.   

## 2023-04-24 NOTE — ED Notes (Signed)
This RN assumed care of the patient - no concerns voiced at this time; provided a warm blanket and instructed where the call bell is.

## 2023-04-24 NOTE — ED Notes (Signed)
Pt assisted to the restroom via wheelchair

## 2023-04-25 ENCOUNTER — Telehealth: Payer: Self-pay

## 2023-04-25 ENCOUNTER — Telehealth: Payer: Self-pay | Admitting: Internal Medicine

## 2023-04-25 ENCOUNTER — Telehealth: Payer: Self-pay | Admitting: Cardiovascular Disease

## 2023-04-25 LAB — LAMOTRIGINE LEVEL: Lamotrigine Lvl: 20.8 ug/mL — ABNORMAL HIGH (ref 2.0–20.0)

## 2023-04-25 NOTE — Transitions of Care (Post Inpatient/ED Visit) (Signed)
Unable to reach pts son [name] and left v/m requesting [name] to call [phone].      04/25/2023  Name: CARIS HONOLD MRN: [id number] DOB: October 07, 1941  Today's TOC FU Call Status: Today's TOC FU Call Status:: Unsuccessul Call (1st Attempt) Unsuccessful Call (1st Attempt) Date: 04/25/23  Attempted to reach the patient regarding the most recent Inpatient/ED visit.  Follow Up Plan: Additional outreach attempts will be made to reach the patient to complete the Transitions of Care (Post Inpatient/ED visit) call.   Signature Lewanda Rife, LPN

## 2023-04-25 NOTE — Telephone Encounter (Signed)
I did not need this encounter. The  nurse said she would fax over the information.

## 2023-04-25 NOTE — Telephone Encounter (Signed)
Jada from [employer] called in to check the status of a lab requisition that needs to be sign by the provider and fax back to her. She faxed it yesterday. Any questions, she's available @[phone]

## 2023-04-25 NOTE — Telephone Encounter (Signed)
I have advise Barrie Lyme, RN at [facility] of this.

## 2023-04-25 NOTE — Telephone Encounter (Signed)
I would suggest an evaluation in the office for a cause of her falls. If she is having any neurological symptoms that are contributing to the falls or if she continues to have issues with falls over the weekend she should be seen right away to evaluate. Otherwise she could be seen in the office next to evaluate this issue.

## 2023-04-25 NOTE — Telephone Encounter (Signed)
Form was printed off today & provider is out of the office. I left a message to see if this can be faxed back next week. If needed today, will need permission for Doc of the day to sign (Dr. Birdie Sons

## 2023-04-25 NOTE — Telephone Encounter (Signed)
[name] [name] from [facility] called in asking to speak to Dr.Tullo. She said that she thinks that theres 2 different bp meds that pt its taking that might be affecting her. She had fall twice in the past week, and her bp been 100/60, 105/69, etc. And she was wondering if provider wants to adjust the meds for pt.? She's available @[phone]

## 2023-04-29 ENCOUNTER — Telehealth: Payer: Self-pay

## 2023-04-29 ENCOUNTER — Encounter (HOSPITAL_COMMUNITY): Payer: Self-pay

## 2023-04-29 ENCOUNTER — Observation Stay: Admit: 2023-04-29 | Payer: Medicare Other | Admitting: Internal Medicine

## 2023-04-29 ENCOUNTER — Inpatient Hospital Stay: Payer: Medicare Other

## 2023-04-29 ENCOUNTER — Other Ambulatory Visit: Payer: Self-pay

## 2023-04-29 ENCOUNTER — Emergency Department: Payer: Medicare Other

## 2023-04-29 ENCOUNTER — Emergency Department
Admission: EM | Admit: 2023-04-29 | Discharge: 2023-04-30 | Disposition: A | Discharge status: Skilled Nursing Facility | Payer: Medicare Other | Attending: Emergency Medicine | Admitting: Emergency Medicine

## 2023-04-29 ENCOUNTER — Other Ambulatory Visit: Payer: Self-pay | Admitting: Oncology

## 2023-04-29 DIAGNOSIS — Z7901 Long term (current) use of anticoagulants: Secondary | ICD-10-CM | POA: Insufficient documentation

## 2023-04-29 DIAGNOSIS — I495 Sick sinus syndrome: Secondary | ICD-10-CM | POA: Insufficient documentation

## 2023-04-29 DIAGNOSIS — R946 Abnormal results of thyroid function studies: Secondary | ICD-10-CM | POA: Diagnosis not present

## 2023-04-29 DIAGNOSIS — I5033 Acute on chronic diastolic (congestive) heart failure: Secondary | ICD-10-CM | POA: Insufficient documentation

## 2023-04-29 DIAGNOSIS — R4182 Altered mental status, unspecified: Secondary | ICD-10-CM | POA: Diagnosis not present

## 2023-04-29 DIAGNOSIS — R569 Unspecified convulsions: Secondary | ICD-10-CM | POA: Diagnosis not present

## 2023-04-29 DIAGNOSIS — Z950 Presence of cardiac pacemaker: Secondary | ICD-10-CM | POA: Insufficient documentation

## 2023-04-29 DIAGNOSIS — I130 Hypertensive heart and chronic kidney disease with heart failure and stage 1 through stage 4 chronic kidney disease, or unspecified chronic kidney disease: Secondary | ICD-10-CM | POA: Insufficient documentation

## 2023-04-29 DIAGNOSIS — R262 Difficulty in walking, not elsewhere classified: Secondary | ICD-10-CM | POA: Insufficient documentation

## 2023-04-29 DIAGNOSIS — Z85828 Personal history of other malignant neoplasm of skin: Secondary | ICD-10-CM | POA: Insufficient documentation

## 2023-04-29 DIAGNOSIS — R748 Abnormal levels of other serum enzymes: Secondary | ICD-10-CM | POA: Diagnosis not present

## 2023-04-29 DIAGNOSIS — F32A Depression, unspecified: Secondary | ICD-10-CM | POA: Insufficient documentation

## 2023-04-29 DIAGNOSIS — Z1339 Encounter for screening examination for other mental health and behavioral disorders: Secondary | ICD-10-CM | POA: Diagnosis not present

## 2023-04-29 DIAGNOSIS — R4689 Other symptoms and signs involving appearance and behavior: Secondary | ICD-10-CM

## 2023-04-29 DIAGNOSIS — N1831 Chronic kidney disease, stage 3a: Secondary | ICD-10-CM | POA: Diagnosis not present

## 2023-04-29 DIAGNOSIS — M6281 Muscle weakness (generalized): Secondary | ICD-10-CM | POA: Diagnosis not present

## 2023-04-29 DIAGNOSIS — R400 Somnolence: Secondary | ICD-10-CM | POA: Diagnosis present

## 2023-04-29 DIAGNOSIS — J90 Pleural effusion, not elsewhere classified: Secondary | ICD-10-CM | POA: Diagnosis not present

## 2023-04-29 LAB — URINALYSIS, ROUTINE W REFLEX MICROSCOPIC
Bilirubin Urine: NEGATIVE
Glucose, UA: NEGATIVE mg/dL
Hgb urine dipstick: NEGATIVE
Ketones, ur: NEGATIVE mg/dL
Nitrite: NEGATIVE
Protein, ur: 100 mg/dL — AB
Specific Gravity, Urine: 1.018 (ref 1.005–1.030)
pH: 5 (ref 5.0–8.0)

## 2023-04-29 LAB — BASIC METABOLIC PANEL
Anion gap: 11 (ref 5–15)
BUN: 29 mg/dL — ABNORMAL HIGH (ref 8–23)
CO2: 28 mmol/L (ref 22–32)
Calcium: 9.4 mg/dL (ref 8.9–10.3)
Chloride: 102 mmol/L (ref 98–111)
Creatinine, Ser: 1.51 mg/dL — ABNORMAL HIGH (ref 0.44–1.00)
GFR, Estimated: 34 mL/min — ABNORMAL LOW (ref >60)
Glucose, Bld: 99 mg/dL (ref 70–99)
Potassium: 3.2 mmol/L — ABNORMAL LOW (ref 3.5–5.1)
Sodium: 141 mmol/L (ref 135–145)

## 2023-04-29 LAB — HEPATIC FUNCTION PANEL
ALT: 24 U/L (ref 0–44)
AST: 33 U/L (ref 15–41)
Albumin: 3.7 g/dL (ref 3.5–5.0)
Alkaline Phosphatase: 109 U/L (ref 38–126)
Bilirubin, Direct: 0.2 mg/dL (ref 0.0–0.2)
Indirect Bilirubin: 0.7 mg/dL (ref 0.3–0.9)
Total Bilirubin: 0.9 mg/dL (ref 0.3–1.2)
Total Protein: 6.1 g/dL — ABNORMAL LOW (ref 6.5–8.1)

## 2023-04-29 LAB — CBC
HCT: 29.1 % — ABNORMAL LOW (ref 36.0–46.0)
Hemoglobin: 9 g/dL — ABNORMAL LOW (ref 12.0–15.0)
MCH: 29.2 pg (ref 26.0–34.0)
MCHC: 30.9 g/dL (ref 30.0–36.0)
MCV: 94.5 fL (ref 80.0–100.0)
Platelets: 125 10*3/uL — ABNORMAL LOW (ref 150–400)
RBC: 3.08 MIL/uL — ABNORMAL LOW (ref 3.87–5.11)
RDW: 17.2 % — ABNORMAL HIGH (ref 11.5–15.5)
WBC: 4 10*3/uL (ref 4.0–10.5)
nRBC: 0 % (ref 0.0–0.2)

## 2023-04-29 LAB — HEPARIN LEVEL (UNFRACTIONATED): Heparin Unfractionated: >1.1 IU/mL — ABNORMAL HIGH (ref 0.30–0.70)

## 2023-04-29 LAB — URINE DRUG SCREEN, QUALITATIVE (ARMC ONLY)
Amphetamines, Ur Screen: NOT DETECTED
Barbiturates, Ur Screen: NOT DETECTED
Benzodiazepine, Ur Scrn: NOT DETECTED
Cannabinoid 50 Ng, Ur ~~LOC~~: NOT DETECTED
Cocaine Metabolite,Ur ~~LOC~~: NOT DETECTED
MDMA (Ecstasy)Ur Screen: NOT DETECTED
Methadone Scn, Ur: NOT DETECTED
Opiate, Ur Screen: POSITIVE — AB
Phencyclidine (PCP) Ur S: NOT DETECTED
Tricyclic, Ur Screen: NOT DETECTED

## 2023-04-29 LAB — MAGNESIUM: Magnesium: 2.1 mg/dL (ref 1.7–2.4)

## 2023-04-29 LAB — SALICYLATE LEVEL: Salicylate Lvl: <7 mg/dL — ABNORMAL LOW (ref 7.0–30.0)

## 2023-04-29 LAB — LIPASE, BLOOD: Lipase: 84 U/L — ABNORMAL HIGH (ref 11–51)

## 2023-04-29 LAB — AMMONIA: Ammonia: 22 umol/L (ref 9–35)

## 2023-04-29 LAB — CBG MONITORING, ED
Glucose-Capillary: 97 mg/dL (ref 70–99)
Glucose-Capillary: 86 mg/dL (ref 70–99)

## 2023-04-29 LAB — TSH: TSH: 4.842 u[IU]/mL — ABNORMAL HIGH (ref 0.350–4.500)

## 2023-04-29 LAB — T4, FREE: Free T4: 1 ng/dL (ref 0.61–1.12)

## 2023-04-29 LAB — ACETAMINOPHEN LEVEL: Acetaminophen (Tylenol), Serum: <10 ug/mL — ABNORMAL LOW (ref 10–30)

## 2023-04-29 LAB — APTT: aPTT: 34 seconds (ref 24–36)

## 2023-04-29 MED ORDER — LAMOTRIGINE 25 MG PO TABS
150.0000 mg | ORAL_TABLET | Freq: Every day | ORAL | Status: DC
  Administered 2023-04-30: 150 mg via ORAL
  Filled 2023-04-29: qty 2

## 2023-04-29 MED ORDER — SODIUM CHLORIDE 0.9 % IV BOLUS
500.0000 mL | Freq: Once | INTRAVENOUS | Status: AC
  Administered 2023-04-29: 500 mL via INTRAVENOUS

## 2023-04-29 MED ORDER — LAMOTRIGINE 100 MG PO TABS: 200.0000 mg | ORAL_TABLET | Freq: Every day | ORAL | Status: DC

## 2023-04-29 MED ORDER — BRIVARACETAM 25 MG PO TABS
50.0000 mg | ORAL_TABLET | Freq: Two times a day (BID) | ORAL | Status: DC
  Administered 2023-04-30: 50 mg via ORAL
  Filled 2023-04-29 (×3): qty 2

## 2023-04-29 MED ORDER — HEPARIN (PORCINE) 25000 UT/250ML-% IV SOLN
1000.0000 [IU]/h | INTRAVENOUS | Status: DC
  Administered 2023-04-29: 1000 [IU]/h via INTRAVENOUS
  Filled 2023-04-29: qty 250

## 2023-04-29 NOTE — Telephone Encounter (Signed)
Pt's son called back to let us know that pt is being taken back to the ED because she was found unresponsive in her bed.

## 2023-04-29 NOTE — Transitions of Care (Post Inpatient/ED Visit) (Signed)
Unable to reach [place] by phone and left v/m requesting cb at [phone].      04/29/2023  Name: [name] [name] MRN: [id number] DOB: 1941-03-19  Today's TOC FU Call Status: Today's TOC FU Call Status:: Unsuccessful Call (2nd Attempt) Unsuccessful Call (1st Attempt) Date: 04/25/23 Unsuccessful Call (2nd Attempt) Date: 04/29/23  Attempted to reach the patient regarding the most recent Inpatient/ED visit.  Follow Up Plan: Additional outreach attempts will be made to reach the patient to complete the Transitions of Care (Post Inpatient/ED visit) call.   Signature Lewanda Rife, LPN

## 2023-04-29 NOTE — ED Notes (Signed)
Pt accepted to [facility]..waiting on bed asssignment

## 2023-04-29 NOTE — Consult Note (Signed)
ANTICOAGULATION CONSULT NOTE - Initial Consult  Pharmacy Consult for heparin infusion Indication: atrial fibrillation  Allergies  Allergen Reactions   Codeine Nausea And Vomiting and Other (See Comments)    migraine   Epinephrine Palpitations and Shortness Of Breath   Hydrocodone Nausea And Vomiting and Other (See Comments)    MIGRAINE   Hydromorphone Nausea And Vomiting and Other (See Comments)    migraine   Molds & Smuts Anxiety, Other (See Comments) and Shortness Of Breath   Oxycodone Nausea And Vomiting and Other (See Comments)    Severe migraine   Meloxicam Other (See Comments)    Severe reflux   Codeine Other (See Comments)    Migraine   Dextromethorphan Other (See Comments)    seizures   Diphen [Diphenhydramine Hcl] Other (See Comments)    seizure   Diphenhydramine Other (See Comments)    seizure   Diphenhydramine Hcl     Other reaction(s): Other (See Comments)   Diphenylpyraline Other (See Comments)   Doxycycline Itching, Swelling and Other (See Comments)    Facial   Doxycycline Itching and Swelling   Hydrocodone Other (See Comments)    Migraine   Hydromorphone Other (See Comments)    Migraine   Meloxicam Other (See Comments)   Mobic [Meloxicam] Other (See Comments)    reflux   Nsaids    Nsaids Other (See Comments)    Pt on blood thinner   Oxycodone Other (See Comments)    Migraine   Propofol Other (See Comments)   Propofol Other (See Comments)    Very sensitive; patient stated she was told she was told she had apnea    Patient Measurements:   Heparin Dosing Weight: 65.8 kg  Vital Signs: Temp: 94.5 F (34.7 C) (05/28 1942) Temp Source: Rectal (05/28 1942) BP: 109/64 (05/28 1630) Pulse Rate: 73 (05/28 1630)  Labs: Recent Labs    04/29/23 1500  HGB 9.0*  HCT 29.1*  PLT 125*  CREATININE 1.51*    Estimated Creatinine Clearance: 25.8 mL/min (A) (by C-G formula based on SCr of 1.51 mg/dL (H)).   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 Ladona Ridgel   Cataract    Left eye   Chronic kidney disease (CKD) stage G3a/A2, moderately decreased glomerular filtration rate (GFR) between 45-59 mL/min/1.73 square meter and albuminuria creatinine ratio between 30-299 mg/g (HCC) 11/13/2021   Closed bilateral fracture of pubic rami (HCC) 08/28/2022   Diverticulosis    Focal seizures (HCC)    Heart murmur    Hematuria, gross 04/09/2018   Hemorrhoid    Hepatic cyst    innumerable   History of asbestos exposure    Hyperlipidemia    IBS (irritable bowel syndrome)    Idiopathic thrombocytopenic purpura (ITP) (HCC)    Migraine    MVP (mitral valve prolapse)    Near syncope 01/26/2023   Nodule of right lung    Osteoporosis of forearm    RBBB    Restrictive lung disease    Mild  on PFT & likely cardiac in etiology    Right sided sciatica 04/10/2022   S/P Minimally invasive maze operation for atrial fibrillation 10/31/2015   Complete bilateral atrial lesion set using cryothermy and bipolar radiofrequency ablation with clipping of LA appendage via right mini thoracotomy approach   S/P minimally invasive mitral valve replacement with bioprosthetic valve 10/31/2015   33 mm [facility] Mitral bovine bioprosthetic tissue valve placed via right mini thoracotomy approach   Seizures (HCC)    left foot paralysis and left hand paralysis Dr. 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  Assessment:  82 y.o. female   who has a history of anemia,chronic kidney disease, atrial fibrillation, seizures, SVT presented to [facility] ED with unresponsiveness following potentially stressful episode. Possible LP planned, plan to hold apixaban and transition to IV heparin. Pharmacy has been consulted to initiate and manage IV heparin therapy.    Baseline HL > 1.10, aPTT 34 s  Goal of Therapy:  Heparin level 0.3-0.7 units/ml Heparin level 66-102 units/ml Monitor platelets by anticoagulation protocol: Yes   Plan:  Start heparin infusion at 1000 units/hr Check aPTT  level in 8 hours Transition to HL monitoring once therapeutic and correlating with aPTT Continue to monitor H&H and platelets  Sharen Hones, PharmD, BCPS Clinical Pharmacist   04/29/2023,9:06 PM

## 2023-04-29 NOTE — Telephone Encounter (Signed)
Will hold on to form for her appt to make sure this is something that she requested.

## 2023-04-29 NOTE — ED Provider Notes (Signed)
[facility] Provider Note    Event Date/Time   First MD Initiated Contact with Patient 04/29/23 1504     (approximate)   History   Unresponsive    HPI  [name] [name] is a 82 y.o. female   who has a history of anemia, anxiety, chronic kidney disease, atrial fibrillation, prior ITP, migraines, seizures, SVT  EMS called for patient being unresponsive.  Evidently the patient was doing quite well and suddenly became nonresponsive after a potentially stressful episode when discussing a change in her living situation.  Per nursing who knows the patient from previous evaluation, they report the situation is very similar to her previous as well where she could present unresponsive fully and then suddenly awoke.  I reviewed prior notes and she was seen and evaluated by the hospitalist service, psychiatry, emergency department and had extensive workup done with the felt the behavior may be related to a behavioral cause.  Today the patient unable to give any history      Physical Exam   Triage Vital Signs: ED Triage Vitals  Enc Vitals Group     BP 04/29/23 1452 106/70     Pulse Rate 04/29/23 1452 78     Resp 04/29/23 1452 13     Temp 04/29/23 1452 97.6 F (36.4 C)     Temp Source 04/29/23 1452 Axillary     SpO2 04/29/23 1451 94 %     Weight --      Height --      Head Circumference --      Peak Flow --      Pain Score --      Pain Loc --      Pain Edu? --      Excl. in GC? --     Most recent vital signs: Vitals:   04/30/23 1600 04/30/23 1700  BP: 125/69 125/70  Pulse: 70 70  Resp: 17 17  Temp:    SpO2:       General: Eyes are closed.  Resists opening her eyes.  She lays flaccid in all extremities.  Extraocular movements do not show any unusual gyrations or deviation.  Pupils are midpoint they are equal round reactive to light.  She closes her eyes to visual threat CV:  Good peripheral perfusion.  Normal tones and rate Resp:  Normal effort.   Clear bilaterally Abd:  No distention.  Soft nontender nondistended Other:  She primarily has her extremities flaccid but if you raise her arms above her she will seemingly intentionally avoid placing her arms over her face.  She is really resting with even unlabored respirations normocephalic atraumatic.  She appears to be quite [place], but shows some evidence of potential wakefulness.  Very unusual exam, involve Dr. Iver Nestle neurologist shortly after arrival   ED Results / Procedures / Treatments   Labs (all labs ordered are listed, but only abnormal results are displayed) Labs Reviewed  URINE CULTURE - Abnormal; Notable for the following components:      Result Value   Culture >=100,000 COLONIES/mL ENTEROCOCCUS FAECALIS (*)    Organism ID, Bacteria ENTEROCOCCUS FAECALIS (*)    All other components within normal limits  BASIC METABOLIC PANEL - Abnormal; Notable for the following components:   Potassium 3.2 (*)    BUN 29 (*)    Creatinine, Ser 1.51 (*)    GFR, Estimated 34 (*)    All other components within normal limits  CBC - Abnormal; Notable for  the following components:   RBC 3.08 (*)    Hemoglobin 9.0 (*)    HCT 29.1 (*)    RDW 17.2 (*)    Platelets 125 (*)    All other components within normal limits  URINALYSIS, ROUTINE W REFLEX MICROSCOPIC - Abnormal; Notable for the following components:   Color, Urine YELLOW (*)    APPearance HAZY (*)    Protein, ur 100 (*)    Leukocytes,Ua TRACE (*)    Bacteria, UA RARE (*)    All other components within normal limits  HEPATIC FUNCTION PANEL - Abnormal; Notable for the following components:   Total Protein 6.1 (*)    All other components within normal limits  LIPASE, BLOOD - Abnormal; Notable for the following components:   Lipase 84 (*)    All other components within normal limits  URINE DRUG SCREEN, QUALITATIVE ([facility] ONLY) - Abnormal; Notable for the following components:   Opiate, Ur Screen POSITIVE (*)    All other  components within normal limits  SALICYLATE LEVEL - Abnormal; Notable for the following components:   Salicylate Lvl <7.0 (*)    All other components within normal limits  ACETAMINOPHEN LEVEL - Abnormal; Notable for the following components:   Acetaminophen (Tylenol), Serum <10 (*)    All other components within normal limits  TSH - Abnormal; Notable for the following components:   TSH 4.842 (*)    All other components within normal limits  HEPARIN LEVEL (UNFRACTIONATED) - Abnormal; Notable for the following components:   Heparin Unfractionated >1.10 (*)    All other components within normal limits  APTT - Abnormal; Notable for the following components:   aPTT 183 (*)    All other components within normal limits  CBC - Abnormal; Notable for the following components:   WBC 3.8 (*)    RBC 2.85 (*)    Hemoglobin 8.4 (*)    HCT 27.1 (*)    RDW 17.2 (*)    Platelets 117 (*)    All other components within normal limits  MAGNESIUM  LAMOTRIGINE LEVEL  MISC [facility] TEST (SEND OUT)  T4, FREE  AMMONIA  APTT  MISC [facility] TEST (SEND OUT)  CBG MONITORING, ED  CBG MONITORING, ED     EKG  And interpreted by me at 1450 heart rate 80 QRS 110 QTc 480 Atrial and ventricular pacing spikes are present.  AV dual chamber pacing   RADIOLOGY  DG Chest Port 1 View  Result Date: 04/29/2023 CLINICAL DATA:  [id number] with altered mental status, unresponsive at [facility] today. EXAM: PORTABLE CHEST 1 VIEW COMPARISON:  Portable chest 04/23/2023 FINDINGS: Aagain noted left chest dual lead pacing system with stable wire insertions, left atrial appendage closure device and metallic mitral valve prosthesis. Moderate to severe cardiomegaly is also again seen with the interval development of central vascular prominence and interstitial edema. There are perihilar alveolar opacities which could be due to alveolar edema, pneumonia or combination. Small pleural effusions are forming but no substantial pleural  effusion at this time. The mediastinum is stable with aortic tortuosity and calcific plaques. Osteopenia and thoracic spondylosis. IMPRESSION: 1. Moderate to severe cardiomegaly with central vascular prominence and interstitial edema, consistent with CHF or fluid overload. 2. Perihilar alveolar opacities which could be due to alveolar edema, pneumonia or combination. 3. Small pleural effusions. 4. Aortic atherosclerosis and uncoiling. Electronically Signed   By: Almira Bar M.D.   On: 04/29/2023 20:17   EEG adult  Result Date: 04/29/2023  Charlsie Quest, MD     04/29/2023  5:15 PM Patient Name: [name] [name] MRN: [id number] Epilepsy Attending: Charlsie Quest Referring Physician/Provider: Gordy Councilman, MD Date: 04/29/2023 Duration: 26.58 mins Patient history: 82yo F after an episode of unresponsiveness. EEG to evaluate for seizure Level of alertness: Awake, asleep AEDs during EEG study: None Technical aspects: This EEG study was done with scalp electrodes positioned according to the 10-20 International system of electrode placement. Electrical activity was reviewed with band pass filter of 1-70Hz , sensitivity of 7 uV/mm, display speed of 22mm/sec with a 60Hz  notched filter applied as appropriate. EEG data were recorded continuously and digitally stored.  Video monitoring was available and reviewed as appropriate. Description: The posterior dominant rhythm consists of 8-9 Hz activity of moderate voltage (25-35 uV) seen predominantly in posterior head regions, symmetric and reactive to eye opening and eye closing. Sleep was characterized by vertex waves, sleep spindles (12 to 14 Hz), maximal frontocentral region. Hyperventilation and photic stimulation were not performed.   IMPRESSION: This study is within normal limits. No seizures or epileptiform discharges were seen throughout the recording. A normal interictal EEG does not exclude the diagnosis of epilepsy. Charlsie Quest   CT Head Wo  Contrast  Result Date: 04/29/2023 CLINICAL DATA:  Delirium, found down EXAM: CT HEAD WITHOUT CONTRAST TECHNIQUE: Contiguous axial images were obtained from the base of the skull through the vertex without intravenous contrast. RADIATION DOSE REDUCTION: This exam was performed according to the departmental dose-optimization program which includes automated exposure control, adjustment of the mA and/or kV according to patient size and/or use of iterative reconstruction technique. COMPARISON:  04/23/2023 FINDINGS: Brain: Normal anatomic configuration. Parenchymal volume is relatively well preserved given the patient's age. Mild periventricular white matter changes are present likely reflecting the sequela of small vessel ischemia. No abnormal intra or extra-axial mass lesion or fluid collection. No abnormal mass effect or midline shift. No evidence of acute intracranial hemorrhage or infarct. Ventricular size is normal. Cerebellum unremarkable. Vascular: No asymmetric hyperdense vasculature at the skull base. Skull: Intact Sinuses/Orbits: Paranasal sinuses are clear. Orbits are unremarkable. Other: Mastoid air cells and middle ear cavities are clear. IMPRESSION: 1. No acute intracranial hemorrhage or infarct. 2. Mild senescent change. Electronically Signed   By: Helyn Numbers M.D.   On: 04/29/2023 15:35      PROCEDURES:  Critical Care performed: No  Procedures   MEDICATIONS ORDERED IN ED: Medications  sodium chloride 0.9 % bolus 500 mL (0 mLs Intravenous Stopped 04/29/23 1757)     IMPRESSION / MDM / ASSESSMENT AND PLAN / ED COURSE  I reviewed the triage vital signs and the nursing notes.                              Differential diagnosis includes, but is not limited to, possible causes of unresponsiveness including but not limited to metabolic, infectious, central neurologic, seizure, cardiac.  No hypoxia or increased work of breathing seems less likely respiratory.  No associated abdominal  symptoms patient's symptoms evidently appeared quite rapidly today in the setting of having her family seen as social work or considering moving out of her home.  Also recent ED visit which was reviewed that appears to be of similar nature where and the patient was felt to have behavioral cause for unresponsiveness.  Some of her exam findings today seem to be suggestive of potential behavioral cause, I did involve our  neurologist Dr. Iver Nestle specially given her son reports that some of these issues seem to arisen fairly rapidly after starting her new seizure medication in April.  At the present time she is hemodynamically stable but has decreased level of responsiveness.  Patient's presentation is most consistent with acute complicated illness / injury requiring diagnostic workup.  EEG negative for seizure activity.  Having discussed with our neurology team, they recommend patient be transferred to [facility] for neuro assessment and continuous EEG monitoring.  The patient is on the cardiac monitor to evaluate for evidence of arrhythmia and/or significant heart rate changes.  Chest x-ray with findings but ostensibly suggestive of vascular congestion.  Again no respiratory distress, no hypoxia with normal white count.  Seems somewhat    ----------------------------------------- 9:00 PM on 04/29/2023 ----------------------------------------- Patient accepted in transfer to [facility] by Dr. Carollee Herter, DO.  Neurology Dr. Wilford Corner advising he will see and evaluate the patient once at [facility], they will plan for EEG monitoring and further consultation.  Patient's son [name] understanding and agreeable with this plan.  The patient cannot herself consent to transfer though she does remain hemodynamically stable with slow and improving responsiveness now following commands with her arms.  Dr. Scotty Court at 11:15 PM.  Currently monitoring the patient carefully, awaiting transfer to [facility] for  neurology consultation with 24 hour EEG monitoring anticipated  FINAL CLINICAL IMPRESSION(S) / ED DIAGNOSES   Final diagnoses:  Altered mental status, unspecified altered mental status type     Rx / DC Orders   ED Discharge Orders     None        Note:  This document was prepared using Dragon voice recognition software and may include unintentional dictation errors.   Sharyn Creamer, MD 05/02/23 2115

## 2023-04-29 NOTE — Telephone Encounter (Signed)
Jada from [employer] called to state they faxed a lab requisition to [place] that needs to be signed and sent back to them.  Mel Almond states it was faxed on 04-24-2023 and 04-25-2023.  Mel Almond states she would like to confirm that we received it.

## 2023-04-29 NOTE — ED Notes (Signed)
Patient appears sleeping upon RN entering room with spontaneous regular respirations on room air. Patient continues to hold eyes closed during assessment, but when asked to open eyes patient opens eyes and noted to track with eyes and make eye contact. Patient overcomes gravity on command to BUE and very weakly moves legs on command. Noted edema to BLE. Attempted to obtain axillary temperature with no reading. Attempted to obtain oral temperature and patient followed command to open mouth for temperature probe and close mouth but oral temp unobtainable. Rectal temperature obtained and noted to be 94.5. Patient repositioned for comfort and placed in gown. Brief clean and dry. Bed alarm on. Call light in reach, siderails up. AV paced on monitor at 70's. When RN asked patient if her mouth was dry and wanted some oral sponges, patient weakly nodded head yes. When RN asked if she could address patient by first name, patient also weakly nodded yes. No acute distress noted at this time.

## 2023-04-29 NOTE — Telephone Encounter (Signed)
See previous message

## 2023-04-29 NOTE — Progress Notes (Signed)
Eeg done 

## 2023-04-29 NOTE — ED Notes (Signed)
Chen MD returned call to Priscella Mann spoke with him and will call him back due to being in an emergency

## 2023-04-29 NOTE — TOC Initial Note (Signed)
Transition of Care [facility]) - Initial/Assessment Note    Patient Details  Name: [name] [name] MRN: [id number] Date of Birth: 07/10/1941  [facility] of [facility]) CM/SW Contact:    Darolyn Rua, LCSW Phone Number: 04/29/2023, 4:21 PM  Clinical Narrative:                  Patient from [facility] IL[facility], patient was found unresponsive at ILF.   CSW spoke with Lucrezia Starch at [phone] at [facility] they report this is patient's 5th ED visit they are advocating for increased medical workup and psych workup to determine what is the reasoning behind this.   CSW has updated MD on above concerns to aid in decreasing consistent readmissions.    Expected Discharge Plan:  (TBD)     Patient Goals and CMS Choice Patient states their goals for this hospitalization and ongoing recovery are:: to go home          Expected Discharge Plan and Services       Living arrangements for the past 2 months: Independent Living Facility [facility])                                      Prior Living Arrangements/Services Living arrangements for the past 2 months: Independent Living Facility ([facility]) Lives with:: Facility Resident                   Activities of Daily Living      Permission Sought/Granted                  Emotional Assessment              Admission diagnosis:  unconcious Patient Active Problem List   Diagnosis Date Noted   Altered mental status 04/23/2023   IDA (iron deficiency anemia) 04/03/2023   CAP (community acquired pneumonia) 01/26/2023   Sepsis due to pneumonia (HCC) 01/26/2023   Polypharmacy 08/28/2022   Exposure to mold 08/28/2022   Candidiasis of anus 08/28/2022   Right groin pain 06/26/2022   Lumbar radiculitis 05/09/2022   Right sided sciatica 04/10/2022   Anemia of chronic disease 04/02/2022   DDD (degenerative disc disease), lumbar 03/31/2022   Spondylosis 03/31/2022   At high risk for falls 03/31/2022   Chronic  kidney disease (CKD) stage G3a/A2, moderately decreased glomerular filtration rate (GFR) between 45-59 mL/min/1.73 square meter and albuminuria creatinine ratio between 30-299 mg/g (HCC) 11/13/2021   Do not resuscitate status 08/14/2021   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   Elevated serum lactate dehydrogenase (LDH) 08/01/2021   PAF (paroxysmal atrial fibrillation) (HCC) 06/19/2021   Osteoporosis 06/19/2021   Seizure disorder (HCC) 06/19/2021   Hyperlipidemia 06/19/2021   Concern about end of life 04/24/2021   Chronic bilateral low back pain without sciatica 04/12/2021   Fall as cause of accidental injury at home as place of occurrence 04/12/2021   Acquired thrombophilia (HCC) 06/14/2020   Carotid stenosis 04/21/2020   Inguinal hernia of right side without obstruction or gangrene 02/09/2020   Sinus node dysfunction (HCC) 07/02/2019   Intracranial atherosclerosis 10/06/2018   Focal seizures (HCC) 05/14/2018   Arthritis of both hands 12/14/2017   Allergy to environmental factors 08/27/2017   Anemia in chronic kidney disease 02/17/2017   Visit for preventive health examination 05/23/2016   Hepatic cyst 05/04/2016   Chronic systolic congestive heart failure (HCC) 05/03/2016  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   H/O mitral valve replacement 10/31/2015   S/P Minimally invasive maze operation for atrial fibrillation 10/31/2015   Atrial fibrillation, persistent (HCC)    Restrictive lung disease 09/12/2015   Pulmonary hypertension (HCC) 08/08/2015   Nodule of right lung 08/04/2015   Thoracic aorta atherosclerosis (HCC) 02/05/2015   Family history of cerebral aneurysm 08/02/2014   Diverticulosis of colon without hemorrhage 01/09/2014   Hospital discharge follow-up 04/15/2013   Thrombocytopenia (HCC) 02/04/2013   IBS (irritable bowel syndrome) 12/31/2012   History of asbestos exposure 06/27/2012   Cataract extraction status of right eye  06/25/2012   RBBB 05/05/2012   Mitral valve prolapse 05/02/2011   PCP:  Sherlene Shams, MD Pharmacy:   [facility] - [place], [place] - [address]enee [address]er [place] [address] Phone: [phone] Fax: [phone]     Social Determinants of Health (SDOH) Social History: SDOH Screenings   Food Insecurity: No Food Insecurity (03/17/2023)  Housing: Medium Risk (03/17/2023)  Transportation Needs: No Transportation Needs (03/17/2023)  Utilities: Not At Risk (01/27/2023)  Depression (PHQ2-9): High Risk (02/03/2023)  Financial Resource Strain: Low Risk  (03/17/2023)  Physical Activity: Unknown (03/17/2023)  Social Connections: Unknown (03/17/2023)  Stress: No Stress Concern Present (03/17/2023)  Tobacco Use: Low Risk  (04/29/2023)   SDOH Interventions:     Readmission Risk Interventions     No data to display

## 2023-04-29 NOTE — ED Notes (Signed)
Called [facility] Batten) to repage hospitalist per Griffith Citron

## 2023-04-29 NOTE — ED Notes (Signed)
Imogene Burn, MD returned call, but Fanny Bien, MD on another line. Imogene Burn, MD hung up after waiting. I will now recall [facility]

## 2023-04-29 NOTE — Procedures (Signed)
Patient Name: [name] [name]  MRN: [id number]  Epilepsy Attending: Charlsie Quest  Referring Physician/Provider: Gordy Councilman, MD  Date: 04/29/2023 Duration: 26.58 mins  Patient history: 82yo F after an episode of unresponsiveness. EEG to evaluate for seizure  Level of alertness: Awake, asleep  AEDs during EEG study: None   Technical aspects: This EEG study was done with scalp electrodes positioned according to the 10-20 International system of electrode placement. Electrical activity was reviewed with band pass filter of 1-70Hz , sensitivity of 7 uV/mm, display speed of 80mm/sec with a 60Hz  notched filter applied as appropriate. EEG data were recorded continuously and digitally stored.  Video monitoring was available and reviewed as appropriate.  Description: The posterior dominant rhythm consists of 8-9 Hz activity of moderate voltage (25-35 uV) seen predominantly in posterior head regions, symmetric and reactive to eye opening and eye closing. Sleep was characterized by vertex waves, sleep spindles (12 to 14 Hz), maximal frontocentral region. Hyperventilation and photic stimulation were not performed.     IMPRESSION: This study is within normal limits. No seizures or epileptiform discharges were seen throughout the recording.  A normal interictal EEG does not exclude the diagnosis of epilepsy.   [name] Annabelle Harman

## 2023-04-29 NOTE — ED Notes (Signed)
Called [facility]) to request page out for hospitialist per Fanny Bien, MD

## 2023-04-29 NOTE — ED Notes (Signed)
Oral care performed. Patient weakly opens and closes mouth on command with oral swabs. Mouth moisturizer applied.

## 2023-04-29 NOTE — ED Notes (Signed)
EEG being completed at bedside.

## 2023-04-29 NOTE — ED Triage Notes (Signed)
Pt brought in from [facility]. She was found unresponsive by social worker at about 1230 today, was told she was tired and to let her sleep it off by son. Social worker checked back in on pt at about 1400 and she was still unarousable; EMS was called. Upon arrival to ED, pt is alert to painful stimuli only.

## 2023-04-29 NOTE — ED Notes (Signed)
Patient resting with eyes closed. Follows commands to open eyes, States first and last name. Shrugged when asked if she knew where she was. Patient reoriented to place and situation and nodded head in understanding. Moves all extremities weakly on command. No distress noted at this time.

## 2023-04-29 NOTE — ED Notes (Signed)
Dr. Fanny Bien notified of rectal temp of 94.5. Bair hugger placed on patient.
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
## 2023-04-29 NOTE — ED Notes (Signed)
Two nurse telephone consent obtained for transfer to [facility] from patient's son, Emanuelly Bleecker. Second RN receiving consent A. Konrad Dolores, RN.

## 2023-04-30 ENCOUNTER — Telehealth: Payer: Self-pay | Admitting: Internal Medicine

## 2023-04-30 ENCOUNTER — Ambulatory Visit: Payer: Medicare Other | Admitting: Internal Medicine

## 2023-04-30 ENCOUNTER — Telehealth: Payer: Self-pay

## 2023-04-30 ENCOUNTER — Observation Stay: Payer: Medicare Other

## 2023-04-30 ENCOUNTER — Telehealth: Payer: Self-pay | Admitting: Cardiovascular Disease

## 2023-04-30 ENCOUNTER — Other Ambulatory Visit: Payer: Self-pay

## 2023-04-30 DIAGNOSIS — Z1339 Encounter for screening examination for other mental health and behavioral disorders: Secondary | ICD-10-CM

## 2023-04-30 DIAGNOSIS — F445 Conversion disorder with seizures or convulsions: Secondary | ICD-10-CM

## 2023-04-30 DIAGNOSIS — I495 Sick sinus syndrome: Secondary | ICD-10-CM | POA: Diagnosis not present

## 2023-04-30 LAB — APTT: aPTT: 183 seconds (ref 24–36)

## 2023-04-30 LAB — CBC
HCT: 27.1 % — ABNORMAL LOW (ref 36.0–46.0)
Hemoglobin: 8.4 g/dL — ABNORMAL LOW (ref 12.0–15.0)
MCH: 29.5 pg (ref 26.0–34.0)
MCHC: 31 g/dL (ref 30.0–36.0)
MCV: 95.1 fL (ref 80.0–100.0)
Platelets: 117 10*3/uL — ABNORMAL LOW (ref 150–400)
RBC: 2.85 MIL/uL — ABNORMAL LOW (ref 3.87–5.11)
RDW: 17.2 % — ABNORMAL HIGH (ref 11.5–15.5)
WBC: 3.8 10*3/uL — ABNORMAL LOW (ref 4.0–10.5)
nRBC: 0 % (ref 0.0–0.2)

## 2023-04-30 LAB — URINE CULTURE: Culture: >=100000 — AB

## 2023-04-30 LAB — LAMOTRIGINE LEVEL: Lamotrigine Lvl: 17.9 ug/mL (ref 2.0–20.0)

## 2023-04-30 MED ORDER — APIXABAN 2.5 MG PO TABS
2.5000 mg | ORAL_TABLET | Freq: Two times a day (BID) | ORAL | Status: DC
  Administered 2023-04-30: 2.5 mg via ORAL
  Filled 2023-04-30 (×2): qty 1

## 2023-04-30 MED ORDER — HEPARIN (PORCINE) 25000 UT/250ML-% IV SOLN
800.0000 [IU]/h | INTRAVENOUS | Status: DC
  Administered 2023-04-30: 800 [IU]/h via INTRAVENOUS

## 2023-04-30 NOTE — Telephone Encounter (Signed)
Jada from [facility] was calling for lab orders to be signed and sent back. When faxing back please put attention Jada.This fax is time sensitive.  The orders from [facility] were received by fax on 04/30/2023 and sent to provider through the S drive.

## 2023-04-30 NOTE — Telephone Encounter (Signed)
Pt was not transferred to [facility]. Pt was discharged back to [facility] in the memory care unit. Son is wanting to schedule pt for a follow up but no appts available until 05/22/2023.

## 2023-04-30 NOTE — ED Notes (Signed)
Bair Hugger turned off at this time.  

## 2023-04-30 NOTE — ED Notes (Signed)
Phlebotomist at bedside at this time  

## 2023-04-30 NOTE — [place] FL2 (Signed)
[place] MEDICAID FL2 LEVEL OF CARE FORM     IDENTIFICATION  Patient Name: [name] [name] Birthdate: 02/10/1941 Sex: female Admission Date (Current Location): 04/29/2023  [facility] and [place] Number:  Chiropodist and Address:  [facility], [address], [place], [place] [address]      Provider Number: [id number]  Attending Physician Name and Address:  No att. providers found  Relative Name and Phone Number:       Current Level of Care: Hospital Recommended Level of Care: Assisted Living Facility, Memory Care Prior Approval Number:    Date Approved/Denied:   PASRR Number: [id number]  Discharge Plan: Other (Comment) (Memory Care ALF)    Current Diagnoses: Patient Active Problem List   Diagnosis Date Noted   Altered mental status 04/23/2023   IDA (iron deficiency anemia) 04/03/2023   CAP (community acquired pneumonia) 01/26/2023   Sepsis due to pneumonia (HCC) 01/26/2023   Polypharmacy 08/28/2022   Exposure to mold 08/28/2022   Candidiasis of anus 08/28/2022   Right groin pain 06/26/2022   Lumbar radiculitis 05/09/2022   Right sided sciatica 04/10/2022   Anemia of chronic disease 04/02/2022   DDD (degenerative disc disease), lumbar 03/31/2022   Spondylosis 03/31/2022   At high risk for falls 03/31/2022   Chronic kidney disease (CKD) stage G3a/A2, moderately decreased glomerular filtration rate (GFR) between 45-59 mL/min/1.73 square meter and albuminuria creatinine ratio between 30-299 mg/g (HCC) 11/13/2021   Do not resuscitate status 08/14/2021   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   Elevated serum lactate dehydrogenase (LDH) 08/01/2021   PAF (paroxysmal atrial fibrillation) (HCC) 06/19/2021   Osteoporosis 06/19/2021   Seizure disorder (HCC) 06/19/2021   Hyperlipidemia 06/19/2021   Concern about end of life 04/24/2021   Chronic bilateral low back pain without sciatica 04/12/2021   Fall as  cause of accidental injury at home as place of occurrence 04/12/2021   Acquired thrombophilia (HCC) 06/14/2020   Carotid stenosis 04/21/2020   Inguinal hernia of right side without obstruction or gangrene 02/09/2020   Sinus node dysfunction (HCC) 07/02/2019   Intracranial atherosclerosis 10/06/2018   Focal seizures (HCC) 05/14/2018   Arthritis of both hands 12/14/2017   Allergy to environmental factors 08/27/2017   Anemia in chronic kidney disease 02/17/2017   Visit for preventive health examination 05/23/2016   Hepatic cyst 05/04/2016   Chronic systolic congestive heart failure (HCC) 05/03/2016   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   H/O mitral valve replacement 10/31/2015   S/P Minimally invasive maze operation for atrial fibrillation 10/31/2015   Atrial fibrillation, persistent (HCC)    Restrictive lung disease 09/12/2015   Pulmonary hypertension (HCC) 08/08/2015   Nodule of right lung 08/04/2015   Thoracic aorta atherosclerosis (HCC) 02/05/2015   Family history of cerebral aneurysm 08/02/2014   Diverticulosis of colon without hemorrhage 01/09/2014   Hospital discharge follow-up 04/15/2013   Thrombocytopenia (HCC) 02/04/2013   IBS (irritable bowel syndrome) 12/31/2012   History of asbestos exposure 06/27/2012   Cataract extraction status of right eye 06/25/2012   RBBB 05/05/2012   Mitral valve prolapse 05/02/2011    Orientation RESPIRATION BLADDER Height & Weight     Self, Time, Situation, Place  Normal Continent Weight:   Height:     BEHAVIORAL SYMPTOMS/MOOD NEUROLOGICAL BOWEL NUTRITION STATUS   (None)  (Seizure disorder) Continent Diet (Regular)  AMBULATORY STATUS COMMUNICATION OF NEEDS Skin   Limited Assist Verbally Normal  Personal Care Assistance Level of Assistance  Bathing, Feeding, Dressing Bathing Assistance: Limited assistance Feeding assistance: Limited assistance Dressing Assistance: Limited assistance     Functional  Limitations Info  Sight, Hearing, Speech Sight Info: Adequate Hearing Info: Adequate Speech Info: Adequate    SPECIAL CARE FACTORS FREQUENCY  PT (By licensed PT)     PT Frequency: 5 x week              Contractures Contractures Info: Not present    Additional Factors Info  Code Status Code Status Info: Full code Allergies Info: Codeine, Epinephrine, Hydrocodone, Hydromorphone, Molds & Smuts, Oxycodone, Meloxicam, Codeine, Dextromethorphan, Diphen (Diphenhydramine Hcl), Diphenhydramine, Diphenhydramine Hcl, Diphenylpyraline, Doxycycline, Doxycycline, Hydrocodone, Hydromorphone, Meloxicam, Mobic (Meloxicam), Nsaids, Nsaids, Oxycodone, Propofol, Propofol           Current Medications (04/30/2023):  This is the current hospital active medication list Current Facility-Administered Medications  Medication Dose Route Frequency Provider Last Rate Last Admin   apixaban (ELIQUIS) tablet 2.5 mg  2.5 mg Oral BID Bhagat, Srishti L, MD   2.5 mg at 04/30/23 1104   brivaracetam (BRIVIACT) tablet 50 mg  50 mg Oral BID Bhagat, Srishti L, MD   50 mg at 04/30/23 1610   lamoTRIgine (LAMICTAL) tablet 150 mg  150 mg Oral Daily Bhagat, Srishti L, MD   150 mg at 04/30/23 9604   lamoTRIgine (LAMICTAL) tablet 200 mg  200 mg Oral QHS Bhagat, Srishti L, MD       Current Outpatient Medications  Medication Sig Dispense Refill   BRIVIACT 50 MG TABS Take 50-100 mg by mouth 2 (two) times daily. 50 mg in AM, 100 mg HS     butalbital-acetaminophen-caffeine (FIORICET) 50-325-40 MG tablet TAKE 1 TABLET BY MOUTH ONCE DAILY AS NEEDED FOR BACK PAIN OR MIGRAINE MAX OF 3 TABS PER DAY 90 tablet 0   calcium carbonate (CALCIUM 600) 600 MG TABS tablet 600 mg 2 (two) times daily with a meal.     Cholecalciferol (VITAMIN D) 2000 units CAPS Take 2,000 Units by mouth daily.     ELIQUIS 5 MG TABS tablet TAKE ONE TABLET TWICE DAILY (Patient taking differently: Take 5 mg by mouth 2 (two) times daily.) 180 tablet 2   fluticasone  (FLONASE) 50 MCG/ACT nasal spray USE 2 SPRAYS IN BOTH NOSTRILS DAILY (Patient taking differently: Place 2 sprays into both nostrils daily.) 16 g 11   folic acid (FOLVITE) 1 MG tablet Take 1 mg by mouth daily.     furosemide (LASIX) 40 MG tablet TAKE ONE (1) TABLET BY MOUTH TWICE PER WEEK; TAKE AN ADDITIONAL HALF (1/2) TO ONE (1) TABLET AS NEEDED FOR FLUID RETENTION (Patient taking differently: Take 40 mg by mouth 2 (two) times a week. TAKE ONE (1) TABLET BY MOUTH TWICE PER WEEK; TAKE AN ADDITIONAL HALF (1/2) TO ONE (1) TABLET AS NEEDED FOR FLUID RETENTION) 90 tablet 0   gabapentin (NEURONTIN) 100 MG capsule Take 200 mg by mouth 3 (three) times daily.     lamoTRIgine (LAMICTAL) 200 MG tablet Take 200 mg by mouth at bedtime.     losartan (COZAAR) 25 MG tablet Take 0.5 tablets (12.5 mg total) by mouth daily. 45 tablet 3   metoprolol succinate (TOPROL-XL) 25 MG 24 hr tablet TAKE ONE TABLET (25 MG) BY MOUTH EVERY DAY (Patient taking differently: Take 25 mg by mouth daily.) 30 tablet 0   montelukast (SINGULAIR) 10 MG tablet TAKE ONE TABLET BY MOUTH AT BEDTIME (Patient taking differently: Take 10 mg by mouth  at bedtime.) 90 tablet 1   polyethylene glycol powder (GLYCOLAX/MIRALAX) 17 GM/SCOOP powder Take 1 Dose by mouth daily.     rosuvastatin (CRESTOR) 40 MG tablet Take 1 tablet (40 mg total) by mouth daily. 90 tablet 3   spironolactone (ALDACTONE) 25 MG tablet Take 0.5 tablets (12.5 mg total) by mouth every other day. 45 tablet 0   VENTOLIN HFA 108 (90 Base) MCG/ACT inhaler INHALE 2 PUFFS INTO THE LUNGS EVERY 6 HOURS AS NEEDED FOR WHEEZING OR SHORTNESS OF BREATH (Patient taking differently: Inhale 2 puffs into the lungs every 6 (six) hours as needed for wheezing or shortness of breath.) 18 g 1   vitamin B-12 (CYANOCOBALAMIN) 1000 MCG tablet Take 1,000 mcg by mouth daily.     lamoTRIgine (LAMICTAL) 150 MG tablet Take 150 mg by mouth daily. (Patient not taking: Reported on 04/30/2023)     metaxalone (SKELAXIN)  800 MG tablet TAKE ONE TABLET BY MOUTH 3 TIMES DAILY (Patient not taking: Reported on 04/23/2023) 90 tablet 1     Discharge Medications: Please see discharge summary for a list of discharge medications.  Relevant Imaging Results:  Relevant Lab Results:   Additional Information SS#: [id number]  Margarito Liner, LCSW

## 2023-04-30 NOTE — ED Notes (Signed)
Pt given lunch tray.

## 2023-04-30 NOTE — TOC CM/SW Note (Addendum)
Patient is from [facility]. Per RN, son wants her to go to the SNF side at discharge. Admissions coordinator is requesting PT and OT consults to determine skilled need. MD has consulted them. It does not appear that patient has had a 3-night inpatient stay within the last 30-days. Admissions coordinator said she saw notes about transitioning to their ALF but there are no plans in the works to do this.  Charlynn Court, CSW [phone]  1:53 pm: There are no SNF beds at [facility] at this time. Per admissions coordinator, patient will have to go to their memory care ALF at discharge and will have to private pay for services. The [employer] worker will call son to notify.  Charlynn Court, CSW [phone]

## 2023-04-30 NOTE — ED Notes (Signed)
Spoke with Maralyn Sago from [employer]. She will call [facility] to verify bed status of pt and call back with update.

## 2023-04-30 NOTE — Progress Notes (Addendum)
Neurology Progress Note  Subjective: - Lonely, expresses not having many friends, expresses sadness over loss of mobility and limited contact with family  Exam: Current vital signs: BP 114/71   Pulse 70   Temp 97.9 F (36.6 C) (Oral)   Resp 11   SpO2 94%  Vital signs in last 24 hours: Temp:  [94.5 F (34.7 C)-98.3 F (36.8 C)] 97.9 F (36.6 C) (05/29 0758) Pulse Rate:  [68-78] 70 (05/29 1000) Resp:  [10-18] 11 (05/29 1015) BP: (99-115)/(57-73) 114/71 (05/29 1000) SpO2:  [93 %-96 %] 94 % (05/29 0758)   Gen: In bed, comfortable  Resp: non-labored breathing, no grossly audible wheezing Cardiac: Perfusing extremities well  Abd: soft, nt   On initial evaluation she is sleeping.  On examiner cheerfully waking her up and asking her to sit on the side of the bed she is smiling and interactive, speaking normally in full sentences.  Uses all 4 extremities equally.  As we began to discuss the details of her situation her voice becomes more hypophonic, poor eye contact, answers become shorter and sparser, eventually only shaking her head yes and no and answering with one-word answers  Awake, alert, oriented to age, place "[facility]", month "May or June" Reports not remembering details of how she got to the hospital Confirms seizure history as only left foot "going away" (numb/weak), when I ask about episodes of left hand issues also described in notes she simply shakes her head no.  She specifically denies any tonic or clonic activity in the foot or gradually progressive symptoms/gradual recovery to me Confirms her most recent "big seizure" was generalized shaking with retained awareness   Pertinent Labs:  Basic Metabolic Panel: Recent Labs  Lab 04/29/23 1500  NA 141  K 3.2*  CL 102  CO2 28  GLUCOSE 99  BUN 29*  CREATININE 1.51*  CALCIUM 9.4  MG 2.1    CBC: Recent Labs  Lab 04/29/23 1500 04/30/23 0534  WBC 4.0 3.8*  HGB 9.0* 8.4*  HCT 29.1* 27.1*   MCV 94.5 95.1  PLT 125* 117*   EEG during event yesterday normal  Impression: Very long discussion with patient at bedside 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  Patient nodded her head yes when asked if she was willing to discuss further with psychiatry to help with managing these symptoms.  Distantly on an outpatient basis could consider EMU monitoring for medication titration and to potentially revisit whether there is an underlying comorbid epilepsy or if there is some other etiology of her intermittent foot "going away."  Reduction of antiseizure medication may help with her cognition overall, especially as her hepatic and renal function change with age and comorbidities.  However given her marked improvement at this time, there is not indication for emergent transfer  Recommendations: -Psychiatry consultation, which patient agreed to participate in at the time of my evaluation -Discontinue heparin, resume apixaban -https://[email or web address].org/wp-content/uploads/[email or web address]/[email or web address].[email or web address] reviewed with patient and family as detailed above -Reviewed with patient and family that acute inpatient hospitalization for workup of functional neurological issues associated with worse outcomes and therefore not recommended -Validated with son that this is a challenging process and outpatient management and follow-up does take time -Discussed that "faking it" is not my primary diagnosis and that his concerns for this contributing would also not be served well by inpatient hospitalization -Close  outpatient follow-up with Dr. Sherryll Burger and consideration of EMU stay for further potential epilepsy characterization/medication titration -Inpatient neurology will be available as needed going forward, please reach out if any additional questions or concerns arise  Brooke Dare MD-PhD [facility] [phone]   Greater than 50 minutes spent in care of this patient majority at bedside

## 2023-04-30 NOTE — ED Notes (Signed)
Attempted to get pt to ambulate to toilet, but pt was not participative. Checked incontinence brief, and it is dry at this time. Pt did not eat her meal tray. Attempted to get pt to take a bite of broccoli but she refused. Pt with eyes closed, but will respond to my questions with head nods or simple "yes/no" responses.

## 2023-04-30 NOTE — ED Notes (Signed)
Harrold Donath in pharmacy notified of APTT of 183. Heparin drip stopped at this time per pharmacy recommendation. State heparin will be restarted at lower rate in one hour.

## 2023-04-30 NOTE — TOC Transition Note (Signed)
Transition of Care [facility]) - CM/SW Discharge Note   Patient Details  Name: [name] [name] MRN: [id number] Date of Birth: 1941/07/01  Transition of Care [facility]) CM/SW Contact:  Margarito Liner, LCSW Phone Number: 04/30/2023, 4:29 PM   Clinical Narrative:  Patient will discharge to [facility] on their memory care/ALF side today due to bed availability in their SNF. RN will call report to [phone]. Son will be here around 5:00 to pick her up. No further concerns. CSW signing off.   Final next level of care: Skilled Nursing Facility (on their memory care/ALF side due to bed availability at [facility] Barriers to Discharge: Barriers Resolved   Patient Goals and CMS Choice      Discharge Placement     Existing PASRR number confirmed : 04/30/23          Patient chooses bed at: [facility] Patient to be transferred to facility by: Son Name of family member notified: Hadelyn Prete Patient and family notified of of transfer: 04/30/23  Discharge Plan and Services Additional resources added to the After Visit Summary for                                       Social Determinants of Health (SDOH) Interventions SDOH Screenings   Food Insecurity: No Food Insecurity (03/17/2023)  Housing: Medium Risk (03/17/2023)  Transportation Needs: No Transportation Needs (03/17/2023)  Utilities: Not At Risk (01/27/2023)  Depression (PHQ2-9): High Risk (02/03/2023)  Financial Resource Strain: Low Risk  (03/17/2023)  Physical Activity: Unknown (03/17/2023)  Social Connections: Unknown (03/17/2023)  Stress: No Stress Concern Present (03/17/2023)  Tobacco Use: Low Risk  (04/29/2023)     Readmission Risk Interventions     No data to display

## 2023-04-30 NOTE — Telephone Encounter (Signed)
Magda Paganini RN - [facility] is calling to follow up the fax order she sent last Friday to Dr. Mariah Milling. They have medications questions and BP concern since pt's BP has been below 110/60. Pt is at risk for fall and dizziness. Pt is in the hospital and she might be discharge back to [facility] and they need recommendations from Dr. Mariah Milling.

## 2023-04-30 NOTE — BH Assessment (Signed)
Comprehensive Clinical Assessment (CCA) Screening, Triage and Referral Note  04/30/2023 [name] [name] [id number]  [name] [name], 82 year old female who presents to [facility] ED involuntarily for treatment. Per triage note, Upon entering pt's room, her eyes were open. I asked her if she remembered me from her last visit to the ER and she nodded her head to indicate that she does remember me. I asked her, "What brought you back to the ER?" The pt responded, "Because I wanted to." With further inquiry, pt refused to answer further questions, but appeared to be trying not to cry. Pt able to move all extremities and denies any complaints at this time. I informed the pt that she had some medications due and asked if she would take them. She replied "Yes."  Pt appears withdrawn and sad.   During TTS assessment pt presents alert and oriented x 4, restless but cooperative, and mood-congruent with affect. The pt does not appear to be responding to internal or external stimuli. Neither is the pt presenting with any delusional thinking. Pt verified the information provided to triage RN.   Pt identifies her main complaint to be that she does not want to transition from her current independent living at [facility] to an ALF. Patient presents with depressed mood and slow to respond to interview questions. Patient says this move is worse than her leaving her home as she does not like being around people. Patient verbalized the understanding that the ALF was for safety concerns. Patient reports a decrease in her eating habits. "I eat one time a day." Pt denies current SI/HI/AH/VH.    Per Cranston Neighbor, NP, pt does not meet criteria for inpatient psychiatric admission.   Chief Complaint:  Chief Complaint  Patient presents with   Loss of Consciousness   Altered Mental Status   Visit Diagnosis: Depression  Patient Reported Information How did you hear about [place]? -- (Nursing facility)  What Is the Reason for Your Visit/Call  Today? Patient was brought to the ED for unresponsiveness.  How Long Has This Been Causing You Problems? <Week  What Do You Feel Would Help You the Most Today? Treatment for Depression or other mood problem   Have You Recently Had Any Thoughts About Hurting Yourself? No  Are You Planning to Commit Suicide/Harm Yourself At This time? No   Have you Recently Had Thoughts About Hurting Someone [name] [name]? No  Are You Planning to Harm Someone at This Time? No  Explanation: No data recorded  Have You Used Any Alcohol or Drugs in the Past 24 Hours? No  How Long Ago Did You Use Drugs or Alcohol? No data recorded What Did You Use and How Much? No data recorded  Do You Currently Have a Therapist/Psychiatrist? No  Name of Therapist/Psychiatrist: No data recorded  Have You Been Recently Discharged From Any Office Practice or Programs? No  Explanation of Discharge From Practice/Program: No data recorded   CCA Screening Triage Referral Assessment Type of Contact: Face-to-Face  Telemedicine Service Delivery:   Is this Initial or Reassessment?   Date Telepsych consult ordered in [facility]:    Time Telepsych consult ordered in [facility]:    Location of Assessment: [facility] ED  Provider Location: [facility] ED    Collateral Involvement: None provided   Does Patient Have a Court Appointed Legal Guardian? No data recorded Name and Contact of Legal Guardian: No data recorded If Minor and Not Living with Parent(s), Who has Custody? No data recorded Is CPS involved or  ever been involved? Never  Is APS involved or ever been involved? Never   Patient Determined To Be At Risk for Harm To Self or Others Based on Review of Patient Reported Information or Presenting Complaint? No  Method: No Plan  Availability of Means: No access or NA  Intent: Vague intent or NA  Notification Required: No need or identified person  Additional Information for Danger to Others Potential: No data recorded Additional Comments  for Danger to Others Potential: No data recorded Are There Guns or Other Weapons in Your Home? No  Types of Guns/Weapons: No data recorded Are These Weapons Safely Secured?                            No data recorded Who Could Verify You Are Able To Have These Secured: No data recorded Do You Have any Outstanding Charges, Pending Court Dates, Parole/Probation? No data recorded Contacted To Inform of Risk of Harm To Self or Others: No data recorded  Does Patient Present under Involuntary Commitment? No    [place] of Residence: [place]   Patient Currently Receiving the Following Services: Skilled Nursing Facility; Medication Management   Determination of Need: Emergent (2 hours)   Options For Referral: ED Visit; ALF/SNF   Discharge Disposition:     Clerance Lav, Counselor, LCAS-A

## 2023-04-30 NOTE — Evaluation (Signed)
Physical Therapy Evaluation Patient Details Name: [name] [name] MRN: [id number] DOB: 07-21-1941 Today's Date: 04/30/2023  History of Present Illness  presented to ER secondary to recurrent falls, generalized weakness and dissociated seizures  Clinical Impression  Patient resting on stretcher upon arrival to room; maintains eyes closed, absent attempts to self-mobilize or interact with environment without direct and constant cuing from therapist.  Oriented to self, location and general situation; follows very simple commands, but requires increased time/effort for initiation/participation and speaks only in single-word answers.  No clinical signs of pain.  Bilat UE/LE strength and ROM grossly symmetrical and WFL for basic transfers and gait; no focal weakness appreciated.  Strength grossly 3+ to 4-/5 throughout; minimal spontaneous movement, but does activate with act assist effort and able to maintain isometric movement/positioning when mobilized by therapist. Scattered bruising throughout LEs (L hip especially) due to recent fall Currently requiring mod/max assist for bed mobility; maintains unsupported sitting balance with cga/min assist; sit/stand, standing balance and gait (45') with RW, min assist +1 (second person follow for safety).  Requires constant cuing for postural (cervical) extension, eyes open; reciprocal stepping pattern with decreased step height/length; dep assist for walker advancement and facilitation of gait efforts (remains immobile with no active effort to initiate without guidance from therapist).  Unsafe/unable to manage care indep at this time. She appears/presents as very depressed with her psychological state being a significant barrier to her participation with life activities at this time. Discussed with primary RN/MD. Would benefit from skilled PT to address above deficits and promote optimal return to PLOF.; recommend post-acute PT follow up as indicated by  interdisciplinary care team.         Recommendations for follow up therapy are one component of a multi-disciplinary discharge planning process, led by the attending physician.  Recommendations may be updated based on patient status, additional functional criteria and insurance authorization.  Follow Up Recommendations Can patient physically be transported by private vehicle: No     Assistance Recommended at Discharge Frequent or constant Supervision/Assistance  Patient can return home with the following  A lot of help with walking and/or transfers;A lot of help with bathing/dressing/bathroom    Equipment Recommendations    Recommendations for Other Services       Functional Status Assessment Patient has had a recent decline in their functional status and demonstrates the ability to make significant improvements in function in a reasonable and predictable amount of time.     Precautions / Restrictions Precautions Precautions: Fall Restrictions Weight Bearing Restrictions: No      Mobility  Bed Mobility Overal bed mobility: Needs Assistance Bed Mobility: Supine to Sit, Sit to Supine     Supine to sit: Mod assist, Max assist Sit to supine: Max assist, Total assist   General bed mobility comments: poor task initiation, poor dissociation and active use of extremities    Transfers Overall transfer level: Needs assistance Equipment used: Rolling walker (2 wheels) Transfers: Sit to/from Stand Sit to Stand: Min assist, +2 physical assistance                Ambulation/Gait Ambulation/Gait assistance: Min assist, +2 physical assistance Gait Distance (Feet): 45 Feet Assistive device: Rolling walker (2 wheels)         General Gait Details: constant cuing for postural (cervical) extension, eyes open; reciprocal stepping pattern with decreased step height/length; dep assist for walker advancement and facilitation of gait efforts (remains immobile with no active effort  to initiate without guidance from  therapist)  Stairs            Wheelchair Mobility    Modified Rankin (Stroke Patients Only)       Balance Overall balance assessment: Needs assistance Sitting-balance support: No upper extremity supported, Feet supported Sitting balance-Leahy Scale: Fair Sitting balance - Comments: constant cuing for postural/cervical extension, eyes open and active engagement with environment/task   Standing balance support: Bilateral upper extremity supported Standing balance-Leahy Scale: Fair Standing balance comment: constant cuing for postural/cervical extension, eyes open and active engagement with environment/task                             Pertinent Vitals/Pain Pain Assessment Pain Assessment: Faces Faces Pain Scale: No hurt    Home Living Family/patient expects to be discharged to:: Private residence Living Arrangements: Alone Available Help at Discharge: Family;Available PRN/intermittently Type of Home: Independent living facility Home Access: Level entry       Home Layout: One level        Prior Function Prior Level of Function : Independent/Modified Independent;History of Falls (last six months);Driving             Mobility Comments: Mod indep for ADLs, household and community mobilization, intermittent use of RW; per chart, recurrent falls in recent weeks (with noted bruising) ADLs Comments: Ind with ADLs/IADLs, driving, has hired Publishing rights manager 1x/month (per previous documentation)     Hand Dominance   Dominant Hand: Right    Extremity/Trunk Assessment   Upper Extremity Assessment Upper Extremity Assessment: Generalized weakness (grossly 3+ to 4-/5 throughout; minimal spontaneous movement, but does activate with act assist effort and able to maintain isometric movement/positioning when mobilized by therapist)    Lower Extremity Assessment Lower Extremity Assessment: Generalized weakness (grossly 3+ to 4-/5  throughout; minimal spontaneous movement, but does activate with act assist effort and able to maintain isometric movement/positioning when mobilized by therapist. Scattered bruising throughout LEs (L hip especially) due to recent fall)       Communication   Communication:  (minimal/no spontaneous speech or interaction with therapist; does answer questions and respond in single-word answers with very forced, exaggerated speech)  Cognition   Behavior During Therapy: Flat affect Overall Cognitive Status: No family/caregiver present to determine baseline cognitive functioning                                 General Comments: rests with eyes closed, absent spontaneous movement/interaction without direct prompting; oriented to self, reason for admission and general situation; follows simple, one-step commands with increased time for processing, task initiation; communication, movement very forced and effortful        General Comments      Exercises     Assessment/Plan    PT Assessment Patient needs continued PT services  PT Problem List Decreased strength;Decreased activity tolerance;Decreased balance;Decreased mobility;Decreased cognition;Decreased knowledge of use of DME;Decreased safety awareness;Decreased knowledge of precautions       PT Treatment Interventions DME instruction;Gait training;Functional mobility training;Therapeutic activities;Therapeutic exercise;Balance training;Patient/family education;Cognitive remediation    PT Goals (Current goals can be found in the Care Plan section)  Acute Rehab PT Goals PT Goal Formulation: Patient unable to participate in goal setting Time For Goal Achievement: 05/14/23 Potential to Achieve Goals: Fair    Frequency Min 3X/week     Co-evaluation               AM-PAC  PT "6 Clicks" Mobility  Outcome Measure Help needed turning from your back to your side while in a flat bed without using bedrails?: A Little Help  needed moving from lying on your back to sitting on the side of a flat bed without using bedrails?: A Lot Help needed moving to and from a bed to a chair (including a wheelchair)?: A Lot Help needed standing up from a chair using your arms (e.g., wheelchair or bedside chair)?: A Lot Help needed to walk in hospital room?: A Lot Help needed climbing 3-5 steps with a railing? : A Lot 6 Click Score: 13    End of Session Equipment Utilized During Treatment: Gait belt Activity Tolerance: Patient tolerated treatment well Patient left: in bed;with call bell/phone within reach;with bed alarm set Nurse Communication: Mobility status PT Visit Diagnosis: Muscle weakness (generalized) (M62.81);Difficulty in walking, not elsewhere classified (R26.2)    Time: 1610-9604 PT Time Calculation (min) (ACUTE ONLY): 21 min   Charges:   PT Evaluation $PT Eval Moderate Complexity: 1 Mod        Kennedy Bohanon H. Manson Passey, PT, DPT, NCS 04/30/23, 4:04 PM [phone]

## 2023-04-30 NOTE — Telephone Encounter (Signed)
Pt's appt was canceled this morning due to pt still being in the ED and being transferred to [facility].

## 2023-04-30 NOTE — Telephone Encounter (Signed)
Pt is currently hospitalized. I am unable to confirm with pt if she requested these lab orders. We will not fax back until able to confirm with pt.

## 2023-04-30 NOTE — ED Provider Notes (Signed)
Care assumed of patient from outgoing provider.  See their note for initial history, exam and plan.  After discussion with social work and PT OT plan to go to short-term care facility.  FL 2 form was filled out.  Patient son will transport her.  Given return precautions for any worsening symptoms.      Corena Herter, MD 04/30/23 1725

## 2023-04-30 NOTE — Telephone Encounter (Signed)
Pt is currently admitted to hospital.

## 2023-04-30 NOTE — ED Notes (Signed)
Psychiatry at bedside.

## 2023-04-30 NOTE — ED Notes (Signed)
Called [facility]) to check bed status for pt. Bed assignment not ready at this time.

## 2023-04-30 NOTE — ED Notes (Signed)
Dr. Scotty Court notified of critical APTT of 183.

## 2023-04-30 NOTE — ED Notes (Signed)
Upon entering pt's room, her eyes were open. I asked her if she remembered me from her last visit to the ER and she nodded her head to indicate that she does remember me. I asked her, "What brought you back to the ER?" The pt responded, "Because I wanted to." With further inquiry, pt refused to answer further questions, but appeared to be trying not to cry. Pt able to move all extremities and denies any complaints at this time. I informed the pt that she had some medications due and asked if she would take them. She replied "Yes."  Pt appears withdrawn and sad.

## 2023-04-30 NOTE — Plan of Care (Signed)
Patient still pending admission to [facility]. Please see assessment by by Dr. Iver Nestle at [facility] on 04/29/2023. She is being sent here for LTM EEG. Neurology will follow the patient when she arrives in follow-up. Please notify the on-call neurologist of patient's arrival.  -- Milon Dikes, MD Neurologist [facility] Pager: [phone]

## 2023-04-30 NOTE — Telephone Encounter (Signed)
Spoke with Magda Paganini from [place] who reported the facility sent a form last Friday to request changes to pt's blood pressure medication as pt pt blood pressure is running low and she continues to fall.  She stated pt is currently in the hospital but wanted to follow up. Nurse informed Magda Paganini that due to pt being currently admitted, to have facility reach out to hospital for updated orders based on pt's current status.  Magda Paganini verbalized understanding.

## 2023-04-30 NOTE — Consult Note (Signed)
[facility] Face-to-Face Psychiatry Consult   Reason for Consult:  Psychiatric Evaluation Referring Physician:  Pilar Jarvis MD Patient Identification: [name] [name] MRN:  [id number] Principal Diagnosis: <principal problem not specified> Diagnosis:  Active Problems:   * No active hospital problems. *   Total Time spent with patient: 15 minutes  Subjective:   [name] [name] is a 82 y.o. female patient seen for a psychiatric evaluation per her ALF request. Patient states that her independent housing arrangement is in transition to an ALF, which is a higher level of care.   HPI:  [name] [name] is a 82 y.o. female patient seen for a psychiatric evaluation per her ALF request. Patient states that her independent housing arrangement is in transition to an ALF, a higher level of care. Attempted to contact a representative at [facility] ALF x 2 but was unsuccessful with speaking to someone that can provide patient history pertaining to psychiatry consult request. Patient is lying in the hospital bed, A&O x 4, appearing dysphoric, and reluctant to engage at times during the visit. She is slow to respond with low volume speech, which is intentional. Patient noted with depressed mood due to the fact that she will lose some independence by transitioning from an independent living apartment into an Assisted Living Facility at [facility]. Patient reports that this transition is worse than when she moved from her personal property into independent living. Patient acknowledged understanding that her ALF transition is for safety concerns.  She denies AVH/SI/HI.  Past Psychiatric History: depression and anxiety  Risk to Self:  denies Risk to Others:  denies Prior Inpatient Therapy:  N/A Prior Outpatient Therapy:  yes  Past Medical History:  Past Medical History:  Diagnosis Date   Acute on chronic diastolic (congestive) heart failure (HCC)    Allergy    See list   Anemia 2018   Anxiety    Occasionally take  Xanax for sleep   Anxiety associated with depression    Prn alprazolam    Anxiety associated with depression    Arthritis    Hands, Back   Atrial fibrillation, persistent (HCC)    DCCV 08/22/2015   Bradycardia post-op bradycardia, pacer dependent   MDT PPM 11/06/15, Dr. Ladona Ridgel   Cataract    Left eye   Chronic kidney disease (CKD) stage G3a/A2, moderately decreased glomerular filtration rate (GFR) between 45-59 mL/min/1.73 square meter and albuminuria creatinine ratio between 30-299 mg/g (HCC) 11/13/2021   Closed bilateral fracture of pubic rami (HCC) 08/28/2022   Diverticulosis    Focal seizures (HCC)    Heart murmur    Hematuria, gross 04/09/2018   Hemorrhoid    Hepatic cyst    innumerable   History of asbestos exposure    Hyperlipidemia    IBS (irritable bowel syndrome)    Idiopathic thrombocytopenic purpura (ITP) (HCC)    Migraine    MVP (mitral valve prolapse)    Near syncope 01/26/2023   Nodule of right lung    Osteoporosis of forearm    RBBB    Restrictive lung disease    Mild on PFT & likely cardiac in etiology    Right sided sciatica 04/10/2022   S/P Minimally invasive maze operation for atrial fibrillation 10/31/2015   Complete bilateral atrial lesion set using cryothermy and bipolar radiofrequency ablation with clipping of LA appendage via right mini thoracotomy approach   S/P minimally invasive mitral valve replacement with bioprosthetic valve 10/31/2015   33 mm [facility] Mitral bovine bioprosthetic  tissue valve placed via right mini thoracotomy approach   Seizures (HCC)    left foot paralysis and left hand paralysis Dr. 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   SVT (supraventricular tachycardia)    Thoracic aorta atherosclerosis (HCC)    TIA (transient ischemic attack)    Visit for preventive health examination 05/23/2016    Past Surgical History:  Procedure Laterality Date   BREAST EXCISIONAL BIOPSY Right  Late 80s   Negative X2   CARDIAC CATHETERIZATION N/A 10/18/2015   Procedure: Right/Left Heart Cath and Coronary Angiography;  Surgeon: Tonny Bollman, MD;  Location: [facility] INVASIVE CV LAB;  Service: Cardiovascular;  Laterality: N/A;   CARDIOVERSION N/A 08/22/2015   Procedure: CARDIOVERSION;  Surgeon: Vesta Mixer, MD;  Location: [facility] ENDOSCOPY;  Service: Cardiovascular;  Laterality: N/A;   COLONOSCOPY  2003   EP IMPLANTABLE DEVICE N/A 11/06/2015   Procedure: Pacemaker Implant;  Surgeon: Marinus Maw, MD;  Location: [facility] INVASIVE CV LAB;  Service: Cardiovascular;  Laterality: N/A;   EP IMPLANTABLE DEVICE N/A 02/20/2016   Procedure: Lead Extraction;  Surgeon: Marinus Maw, MD;  Location: [facility] INVASIVE CV LAB;  Service: Cardiovascular;  Laterality: N/A;   EYE SURGERY Right 2013   FEMUR IM NAIL Left 06/18/2021   Procedure: INTRAMEDULLARY (IM) NAIL FEMORAL, OPEN REDUCTION INTERNAL FIXATION FEMUR RIGHT LITTLE FINGER PIP CLOSED REDUCTION;  Surgeon: Myrene Galas, MD;  Location: [facility] OR;  Service: Orthopedics;  Laterality: Left;   FOOT SURGERY     ~2007 right foot bunion   MANDIBLE FRACTURE SURGERY  03/26/2013   MINIMALLY INVASIVE MAZE PROCEDURE N/A 10/31/2015   Procedure: MINIMALLY INVASIVE MAZE PROCEDURE;  Surgeon: Purcell Nails, MD;  Location: [facility] OR;  Service: Open Heart Surgery;  Laterality: N/A;   MITRAL VALVE REPLACEMENT Right 10/31/2015   Procedure: MINIMALLY INVASIVE MITRAL VALVE (MV) REPLACEMENT;  Surgeon: Purcell Nails, MD;  Location: [facility] OR;  Service: Open Heart Surgery;  Laterality: Right;   PACEMAKER LEAD REMOVAL  02/20/2016   TEE WITH CARDIOVERSION     TEE WITHOUT CARDIOVERSION N/A 08/22/2015   Procedure: TRANSESOPHAGEAL ECHOCARDIOGRAM (TEE);  Surgeon: Vesta Mixer, MD;  Location: [facility] ENDOSCOPY;  Service: Cardiovascular;  Laterality: N/A;   TEE WITHOUT CARDIOVERSION N/A 10/31/2015   Procedure: TRANSESOPHAGEAL ECHOCARDIOGRAM (TEE);  Surgeon: Purcell Nails, MD;  Location: [facility] OR;   Service: Open Heart Surgery;  Laterality: N/A;   TUBAL LIGATION     VARICOSE VEIN SURGERY Right    Family History:  Family History  Problem Relation Age of Onset   Hypertension Mother    Arrhythmia Mother    Heart failure Mother    Arrhythmia Brother    Stroke Brother 65       cerebral hemorrhage, nonsmoker, no HTN   Prostate cancer Brother    Stroke Father        from an aneurysm   Stroke Maternal Aunt 83       cerebral hemorrhage   Liver cancer Maternal Grandmother    Atrial fibrillation Son    Celiac disease Son    Heart attack Neg Hx    Breast cancer Neg Hx    Family Psychiatric  History: none known Social History:  Social History   Substance and Sexual Activity  Alcohol Use No   Alcohol/week: 3.0 standard drinks of alcohol   Types: 3 Standard drinks or equivalent per week     Social History  Substance and Sexual Activity  Drug Use No    Social History   Socioeconomic History   Marital status: Widowed    Spouse name: Deceased   Number of children: 2   Years of education: 43   Highest education level: Master's degree (e.g., MA, MS, MEng, MEd, MSW, MBA)  Occupational History   Occupation: Retired  Tobacco Use   Smoking status: Never   Smokeless tobacco: Never  Vaping Use   Vaping Use: Never used  Substance and Sexual Activity   Alcohol use: No    Alcohol/week: 3.0 standard drinks of alcohol    Types: 3 Standard drinks or equivalent per week   Drug use: No   Sexual activity: Not Currently  Other Topics Concern   Not on file  Social History Narrative   ** Merged History Encounter **       She is a widow.  Husband died from metastatic renal cell cancer. Originally from [place]. Previously lived in [place] from 1975-2007. Moved to [place] in 2007. No mold exposure recently but did have it through a prior work exposure in 1993. Has a masters in    public health. No bird exposure.  [facility] (09/29/17): She has moved into a retirement community  since last appointment. She reports she had testing at her new residence that was positive for mold. It has since been treated.    Social Determinants of Health   Financial Resource Strain: Low Risk  (03/17/2023)   Overall Financial Resource Strain (CARDIA)    Difficulty of Paying Living Expenses: Not hard at all  Food Insecurity: No Food Insecurity (03/17/2023)   Hunger Vital Sign    Worried About Running Out of Food in the Last Year: Never true    Ran Out of Food in the Last Year: Never true  Transportation Needs: No Transportation Needs (03/17/2023)   PRAPARE - Administrator, Civil Service (Medical): No    Lack of Transportation (Non-Medical): No  Physical Activity: Unknown (03/17/2023)   Exercise Vital Sign    Days of Exercise per Week: Patient declined    Minutes of Exercise per Session: Not on file  Stress: No Stress Concern Present (03/17/2023)   [employer] of Occupational Health - Occupational Stress Questionnaire    Feeling of Stress : Only a little  Social Connections: Unknown (03/17/2023)   Social Connection and Isolation Panel [NHANES]    Frequency of Communication with Friends and Family: Patient declined    Frequency of Social Gatherings with Friends and Family: Once a week    Attends Religious Services: Patient declined    Database administrator or Organizations: Yes    Attends Banker Meetings: Patient declined    Marital Status: Widowed   Additional Social History:    Allergies:   Allergies  Allergen Reactions   Codeine Nausea And Vomiting and Other (See Comments)    migraine   Epinephrine Palpitations and Shortness Of Breath   Hydrocodone Nausea And Vomiting and Other (See Comments)    MIGRAINE   Hydromorphone Nausea And Vomiting and Other (See Comments)    migraine   Molds & Smuts Anxiety, Other (See Comments) and Shortness Of Breath   Oxycodone Nausea And Vomiting and Other (See Comments)    Severe migraine   Meloxicam Other  (See Comments)    Severe reflux   Codeine Other (See Comments)    Migraine   Dextromethorphan Other (See Comments)    seizures   Diphen [  Diphenhydramine Hcl] Other (See Comments)    seizure   Diphenhydramine Other (See Comments)    seizure   Diphenhydramine Hcl     Other reaction(s): Other (See Comments)   Diphenylpyraline Other (See Comments)   Doxycycline Itching, Swelling and Other (See Comments)    Facial   Doxycycline Itching and Swelling   Hydrocodone Other (See Comments)    Migraine   Hydromorphone Other (See Comments)    Migraine   Meloxicam Other (See Comments)   Mobic [Meloxicam] Other (See Comments)    reflux   Nsaids    Nsaids Other (See Comments)    Pt on blood thinner   Oxycodone Other (See Comments)    Migraine   Propofol Other (See Comments)   Propofol Other (See Comments)    Very sensitive; patient stated she was told she was told she had apnea    Labs:  Results for orders placed or performed during the hospital encounter of 04/29/23 (from the past 48 hour(s))  Magnesium     Status: None   Collection Time: 04/29/23  3:00 PM  Result Value Ref Range   Magnesium 2.1 1.7 - 2.4 mg/dL    Comment: Performed at [facility], [address], [place], [place] [address]  Basic metabolic panel     Status: Abnormal   Collection Time: 04/29/23  3:00 PM  Result Value Ref Range   Sodium 141 135 - 145 mmol/L   Potassium 3.2 (L) 3.5 - 5.1 mmol/L   Chloride 102 98 - 111 mmol/L   CO2 28 22 - 32 mmol/L   Glucose, Bld 99 70 - 99 mg/dL    Comment: Glucose reference range applies only to samples taken after fasting for at least 8 hours.   BUN 29 (H) 8 - 23 mg/dL   Creatinine, Ser 1.91 (H) 0.44 - 1.00 mg/dL   Calcium 9.4 8.9 - 47.8 mg/dL   GFR, Estimated 34 (L) >60 mL/min    Comment: (NOTE) Calculated using the CKD-EPI Creatinine Equation (2021)    Anion gap 11 5 - 15    Comment: Performed at [facility], [address] [place], [place] [address]   CBC     Status: Abnormal   Collection Time: 04/29/23  3:00 PM  Result Value Ref Range   WBC 4.0 4.0 - 10.5 K/uL   RBC 3.08 (L) 3.87 - 5.11 MIL/uL   Hemoglobin 9.0 (L) 12.0 - 15.0 g/dL   HCT 13.0 (L) 86.5 - 78.4 %   MCV 94.5 80.0 - 100.0 fL   MCH 29.2 26.0 - 34.0 pg   MCHC 30.9 30.0 - 36.0 g/dL   RDW 69.6 (H) 29.5 - 28.4 %   Platelets 125 (L) 150 - 400 K/uL   nRBC 0.0 0.0 - 0.2 %    Comment: Performed at [facility], [address] [place], [place] [address]  Hepatic function panel     Status: Abnormal   Collection Time: 04/29/23  3:01 PM  Result Value Ref Range   Total Protein 6.1 (L) 6.5 - 8.1 g/dL   Albumin 3.7 3.5 - 5.0 g/dL   AST 33 15 - 41 U/L   ALT 24 0 - 44 U/L   Alkaline Phosphatase 109 38 - 126 U/L   Total Bilirubin 0.9 0.3 - 1.2 mg/dL   Bilirubin, Direct 0.2 0.0 - 0.2 mg/dL   Indirect Bilirubin 0.7 0.3 - 0.9 mg/dL    Comment: Performed at [facility], [address] [place], [place] [address]  Lipase, blood     Status: Abnormal   Collection Time: 04/29/23  3:01 PM  Result Value Ref Range   Lipase 84 (H) 11 - 51 U/L    Comment: Performed at [facility], [address] [place], [place] [address]  Salicylate level     Status: Abnormal   Collection Time: 04/29/23  3:01 PM  Result Value Ref Range   Salicylate Lvl <7.0 (L) 7.0 - 30.0 mg/dL    Comment: Performed at [facility], [address] [place], [place] [address]  Acetaminophen level     Status: Abnormal   Collection Time: 04/29/23  3:01 PM  Result Value Ref Range   Acetaminophen (Tylenol), Serum <10 (L) 10 - 30 ug/mL    Comment: (NOTE) Therapeutic concentrations vary significantly. A range of 10-30 ug/mL  may be an effective concentration for many patients. However, some  are best treated at concentrations outside of this range. Acetaminophen concentrations >150 ug/mL at 4 hours after ingestion  and >50 ug/mL at 12 hours after ingestion are often associated with   toxic reactions.  Performed at [facility], [address] [place], [place] [address]   TSH     Status: Abnormal   Collection Time: 04/29/23  3:01 PM  Result Value Ref Range   TSH 4.842 (H) 0.350 - 4.500 uIU/mL    Comment: Performed by a 3rd Generation assay with a functional sensitivity of <=0.01 uIU/mL. Performed at [facility], [address] [place], [place] [address]   CBG monitoring, ED     Status: None   Collection Time: 04/29/23  3:10 PM  Result Value Ref Range   Glucose-Capillary 97 70 - 99 mg/dL    Comment: Glucose reference range applies only to samples taken after fasting for at least 8 hours.   Comment 1 Notify RN    Comment 2 Document in Chart   Urinalysis, Routine w reflex microscopic -Urine, Catheterized     Status: Abnormal   Collection Time: 04/29/23  5:17 PM  Result Value Ref Range   Color, Urine YELLOW (A) YELLOW   APPearance HAZY (A) CLEAR   Specific Gravity, Urine 1.018 1.005 - 1.030   pH 5.0 5.0 - 8.0   Glucose, UA NEGATIVE NEGATIVE mg/dL   Hgb urine dipstick NEGATIVE NEGATIVE   Bilirubin Urine NEGATIVE NEGATIVE   Ketones, ur NEGATIVE NEGATIVE mg/dL   Protein, ur 130 (A) NEGATIVE mg/dL   Nitrite NEGATIVE NEGATIVE   Leukocytes,Ua TRACE (A) NEGATIVE   RBC / HPF 0-5 0 - 5 RBC/hpf   WBC, UA 6-10 0 - 5 WBC/hpf   Bacteria, UA RARE (A) NONE SEEN   Squamous Epithelial / HPF 0-5 0 - 5 /HPF   Mucus PRESENT    Hyaline Casts, UA PRESENT     Comment: Performed at [facility], [address] [place], [place] [address]  Urine Drug Screen, Qualitative ([facility] only)     Status: Abnormal   Collection Time: 04/29/23  5:17 PM  Result Value Ref Range   Tricyclic, Ur Screen NONE DETECTED NONE DETECTED   Amphetamines, Ur Screen NONE DETECTED NONE DETECTED   MDMA (Ecstasy)Ur Screen NONE DETECTED NONE DETECTED   Cocaine Metabolite,Ur [place] NONE DETECTED NONE DETECTED   Opiate, Ur Screen POSITIVE (A) NONE DETECTED   Phencyclidine (PCP) Ur  S NONE DETECTED NONE DETECTED   Cannabinoid 50 Ng, Ur [place] NONE DETECTED NONE DETECTED   Barbiturates, Ur Screen NONE DETECTED  NONE DETECTED   Benzodiazepine, Ur Scrn NONE DETECTED NONE DETECTED   Methadone Scn, Ur NONE DETECTED NONE DETECTED    Comment: (NOTE) Tricyclics + metabolites, urine    Cutoff 1000 ng/mL Amphetamines + metabolites, urine  Cutoff 1000 ng/mL MDMA (Ecstasy), urine              Cutoff 500 ng/mL Cocaine Metabolite, urine          Cutoff 300 ng/mL Opiate + metabolites, urine        Cutoff 300 ng/mL Phencyclidine (PCP), urine         Cutoff 25 ng/mL Cannabinoid, urine                 Cutoff 50 ng/mL Barbiturates + metabolites, urine  Cutoff 200 ng/mL Benzodiazepine, urine              Cutoff 200 ng/mL Methadone, urine                   Cutoff 300 ng/mL  The urine drug screen provides only a preliminary, unconfirmed analytical test result and should not be used for non-medical purposes. Clinical consideration and professional judgment should be applied to any positive drug screen result due to possible interfering substances. A more specific alternate chemical method must be used in order to obtain a confirmed analytical result. Gas chromatography / mass spectrometry (GC/MS) is the preferred confirm atory method. Performed at [facility], [address] [place], [place] [address]   POC CBG, ED     Status: None   Collection Time: 04/29/23  7:58 PM  Result Value Ref Range   Glucose-Capillary 86 70 - 99 mg/dL    Comment: Glucose reference range applies only to samples taken after fasting for at least 8 hours.  T4, free     Status: None   Collection Time: 04/29/23  9:18 PM  Result Value Ref Range   Free T4 1.00 0.61 - 1.12 ng/dL    Comment: (NOTE) Biotin ingestion may interfere with free T4 tests. If the results are inconsistent with the TSH level, previous test results, or the clinical presentation, then consider biotin interference. If needed, order  repeat testing after stopping biotin. Performed at [facility], [address] [place], [place] [address]   Ammonia     Status: None   Collection Time: 04/29/23  9:18 PM  Result Value Ref Range   Ammonia 22 9 - 35 umol/L    Comment: Performed at [facility], [address] [place], [place] [address]  Heparin level (unfractionated)     Status: Abnormal   Collection Time: 04/29/23  9:18 PM  Result Value Ref Range   Heparin Unfractionated >1.10 (H) 0.30 - 0.70 IU/mL    Comment: (NOTE) The clinical reportable range upper limit is being lowered to >1.10 to align with the FDA approved guidance for the current laboratory assay.  If heparin results are below expected values, and patient dosage has  been confirmed, suggest follow up testing of antithrombin III levels. Performed at [facility], [address] [place], [place] [address]   APTT     Status: None   Collection Time: 04/29/23  9:18 PM  Result Value Ref Range   aPTT 34 24 - 36 seconds    Comment: Performed at [facility], [address] [place], [place] [address]  APTT     Status: Abnormal   Collection Time: 04/30/23  5:34 AM  Result Value Ref  Range   aPTT 183 (HH) 24 - 36 seconds    Comment:        IF BASELINE aPTT IS ELEVATED, SUGGEST PATIENT RISK ASSESSMENT BE USED TO DETERMINE APPROPRIATE ANTICOAGULANT THERAPY. CRITICAL RESULT CALLED TO, READ BACK BY AND VERIFIED WITH: RACHEL HARKLESS 04/30/23 9604 MW Performed at [facility], [address] [place], [place] [address]   CBC     Status: Abnormal   Collection Time: 04/30/23  5:34 AM  Result Value Ref Range   WBC 3.8 (L) 4.0 - 10.5 K/uL   RBC 2.85 (L) 3.87 - 5.11 MIL/uL   Hemoglobin 8.4 (L) 12.0 - 15.0 g/dL   HCT 11.9 (L) 14.7 - 82.9 %   MCV 95.1 80.0 - 100.0 fL   MCH 29.5 26.0 - 34.0 pg   MCHC 31.0 30.0 - 36.0 g/dL   RDW 56.2 (H) 13.0 - 86.5 %   Platelets 117 (L) 150 - 400 K/uL   nRBC 0.0 0.0 - 0.2 %     Comment: Performed at [facility], [address] [place], [place] [address]   *Note: Due to a large number of results and/or encounters for the requested time period, some results have not been displayed. A complete set of results can be found in Results Review.    Current Facility-Administered Medications  Medication Dose Route Frequency Provider Last Rate Last Admin   apixaban (ELIQUIS) tablet 2.5 mg  2.5 mg Oral BID Bhagat, Srishti L, MD   2.5 mg at 04/30/23 1104   brivaracetam (BRIVIACT) tablet 50 mg  50 mg Oral BID Bhagat, Srishti L, MD   50 mg at 04/30/23 6295   lamoTRIgine (LAMICTAL) tablet 150 mg  150 mg Oral Daily Bhagat, Srishti L, MD   150 mg at 04/30/23 2841   lamoTRIgine (LAMICTAL) tablet 200 mg  200 mg Oral QHS Bhagat, Srishti L, MD       Current Outpatient Medications  Medication Sig Dispense Refill   BRIVIACT 50 MG TABS Take 50-100 mg by mouth 2 (two) times daily. 50 mg in AM, 100 mg HS     butalbital-acetaminophen-caffeine (FIORICET) 50-325-40 MG tablet TAKE 1 TABLET BY MOUTH ONCE DAILY AS NEEDED FOR BACK PAIN OR MIGRAINE MAX OF 3 TABS PER DAY 90 tablet 0   calcium carbonate (CALCIUM 600) 600 MG TABS tablet 600 mg 2 (two) times daily with a meal.     Cholecalciferol (VITAMIN D) 2000 units CAPS Take 2,000 Units by mouth daily.     ELIQUIS 5 MG TABS tablet TAKE ONE TABLET TWICE DAILY (Patient taking differently: Take 5 mg by mouth 2 (two) times daily.) 180 tablet 2   fluticasone (FLONASE) 50 MCG/ACT nasal spray USE 2 SPRAYS IN BOTH NOSTRILS DAILY (Patient taking differently: Place 2 sprays into both nostrils daily.) 16 g 11   folic acid (FOLVITE) 1 MG tablet Take 1 mg by mouth daily.     furosemide (LASIX) 40 MG tablet TAKE ONE (1) TABLET BY MOUTH TWICE PER WEEK; TAKE AN ADDITIONAL HALF (1/2) TO ONE (1) TABLET AS NEEDED FOR FLUID RETENTION (Patient taking differently: Take 40 mg by mouth 2 (two) times a week. TAKE ONE (1) TABLET BY MOUTH TWICE PER WEEK; TAKE AN  ADDITIONAL HALF (1/2) TO ONE (1) TABLET AS NEEDED FOR FLUID RETENTION) 90 tablet 0   gabapentin (NEURONTIN) 100 MG capsule Take 200 mg by mouth 3 (three) times daily.     lamoTRIgine (LAMICTAL) 200 MG tablet Take 200 mg by mouth  at bedtime.     losartan (COZAAR) 25 MG tablet Take 0.5 tablets (12.5 mg total) by mouth daily. 45 tablet 3   metoprolol succinate (TOPROL-XL) 25 MG 24 hr tablet TAKE ONE TABLET (25 MG) BY MOUTH EVERY DAY (Patient taking differently: Take 25 mg by mouth daily.) 30 tablet 0   montelukast (SINGULAIR) 10 MG tablet TAKE ONE TABLET BY MOUTH AT BEDTIME (Patient taking differently: Take 10 mg by mouth at bedtime.) 90 tablet 1   polyethylene glycol powder (GLYCOLAX/MIRALAX) 17 GM/SCOOP powder Take 1 Dose by mouth daily.     rosuvastatin (CRESTOR) 40 MG tablet Take 1 tablet (40 mg total) by mouth daily. 90 tablet 3   spironolactone (ALDACTONE) 25 MG tablet Take 0.5 tablets (12.5 mg total) by mouth every other day. 45 tablet 0   VENTOLIN HFA 108 (90 Base) MCG/ACT inhaler INHALE 2 PUFFS INTO THE LUNGS EVERY 6 HOURS AS NEEDED FOR WHEEZING OR SHORTNESS OF BREATH (Patient taking differently: Inhale 2 puffs into the lungs every 6 (six) hours as needed for wheezing or shortness of breath.) 18 g 1   vitamin B-12 (CYANOCOBALAMIN) 1000 MCG tablet Take 1,000 mcg by mouth daily.     lamoTRIgine (LAMICTAL) 150 MG tablet Take 150 mg by mouth daily. (Patient not taking: Reported on 04/30/2023)     metaxalone (SKELAXIN) 800 MG tablet TAKE ONE TABLET BY MOUTH 3 TIMES DAILY (Patient not taking: Reported on 04/23/2023) 90 tablet 1    Musculoskeletal: Strength & Muscle Tone:  not assessed Gait & Station:  not assessed Patient leans: N/A            Psychiatric Specialty Exam:  Presentation  General Appearance:  Disheveled  Eye Contact: Minimal  Speech: Clear and Coherent  Speech Volume: Decreased  Handedness: Right   Mood and Affect   Mood: Depressed  Affect: Depressed   Thought Process  Thought Processes: Goal Directed  Descriptions of Associations:Intact  Orientation:Full (Time, Place and Person)  Thought Content:Logical  History of Schizophrenia/Schizoaffective disorder:No data recorded Duration of Psychotic Symptoms:No data recorded Hallucinations:Hallucinations: None  Ideas of Reference:None  Suicidal Thoughts:Suicidal Thoughts: No  Homicidal Thoughts:Homicidal Thoughts: No   Sensorium  Memory: Immediate Good; Recent Good; Remote Good  Judgment: Good  Insight: Good   Executive Functions  Concentration: Good  Attention Span: Good  Recall: Good  Fund of Knowledge: Good  Language: Good   Psychomotor Activity  Psychomotor Activity:Psychomotor Activity: Normal   Assets  Assets: Financial Resources/Insurance; Housing; Social Support   Sleep  Sleep:No data recorded  Physical Exam: Physical Exam Vitals reviewed.  Neurological:     Mental Status: She is alert and oriented to person, place, and time.    Review of Systems  Psychiatric/Behavioral:  Positive for depression. Negative for hallucinations, memory loss, substance abuse and suicidal ideas. The patient is not nervous/anxious.   All other systems reviewed and are negative.  Blood pressure 118/74, pulse 69, temperature 97.9 F (36.6 C), temperature source Oral, resp. rate 12, SpO2 99 %. There is no height or weight on file to calculate BMI.  Treatment Plan Summary: Plan [name] [name] presents with dysphoria due to changes in her living arrangement. She is alert and oriented and does not lack capacity to make decisions. Shared experience of difficulties during life's transitions and recommended outpatient therapy, given this difficult time in her life. Patient was receptive to initiating outpatient therapeutic services following discharge. She is psychiatrically cleared to discharge to [facility] when medically  cleared.  Disposition: No  evidence of imminent risk to self or others at present.   Patient does not meet criteria for psychiatric inpatient admission.Patient will benefit from community resources for outpatient therapy.  Mcneil Sober, NP 04/30/2023 12:18 PM

## 2023-04-30 NOTE — Consult Note (Signed)
ANTICOAGULATION CONSULT NOTE - Initial Consult  Pharmacy Consult for Eliquis Indication: atrial fibrillation  Allergies  Allergen Reactions   Codeine Nausea And Vomiting and Other (See Comments)    migraine   Epinephrine Palpitations and Shortness Of Breath   Hydrocodone Nausea And Vomiting and Other (See Comments)    MIGRAINE   Hydromorphone Nausea And Vomiting and Other (See Comments)    migraine   Molds & Smuts Anxiety, Other (See Comments) and Shortness Of Breath   Oxycodone Nausea And Vomiting and Other (See Comments)    Severe migraine   Meloxicam Other (See Comments)    Severe reflux   Codeine Other (See Comments)    Migraine   Dextromethorphan Other (See Comments)    seizures   Diphen [Diphenhydramine Hcl] Other (See Comments)    seizure   Diphenhydramine Other (See Comments)    seizure   Diphenhydramine Hcl     Other reaction(s): Other (See Comments)   Diphenylpyraline Other (See Comments)   Doxycycline Itching, Swelling and Other (See Comments)    Facial   Doxycycline Itching and Swelling   Hydrocodone Other (See Comments)    Migraine   Hydromorphone Other (See Comments)    Migraine   Meloxicam Other (See Comments)   Mobic [Meloxicam] Other (See Comments)    reflux   Nsaids    Nsaids Other (See Comments)    Pt on blood thinner   Oxycodone Other (See Comments)    Migraine   Propofol Other (See Comments)   Propofol Other (See Comments)    Very sensitive; patient stated she was told she was told she had apnea    Patient Measurements: Wt 65.8 kg Ht 5'5"  Vital Signs: Temp: 97.9 F (36.6 C) (05/29 0758) Temp Source: Oral (05/29 0758) BP: 114/71 (05/29 1000) Pulse Rate: 70 (05/29 1000)  Labs: Recent Labs    04/29/23 1500 04/29/23 2118 04/30/23 0534  HGB 9.0*  --  8.4*  HCT 29.1*  --  27.1*  PLT 125*  --  117*  APTT  --  34 183*  HEPARINUNFRC  --  >1.10*  --   CREATININE 1.51*  --   --     Estimated Creatinine Clearance: 25.8 mL/min (A)  (by C-G formula based on SCr of 1.51 mg/dL (H)).   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 Ladona Ridgel   Cataract    Left eye   Chronic kidney disease (CKD) stage G3a/A2, moderately decreased glomerular filtration rate (GFR) between 45-59 mL/min/1.73 square meter and albuminuria creatinine ratio between 30-299 mg/g (HCC) 11/13/2021   Closed bilateral fracture of pubic rami (HCC) 08/28/2022   Diverticulosis    Focal seizures (HCC)    Heart murmur    Hematuria, gross 04/09/2018   Hemorrhoid    Hepatic cyst    innumerable   History of asbestos exposure    Hyperlipidemia    IBS (irritable bowel syndrome)    Idiopathic thrombocytopenic purpura (ITP) (HCC)    Migraine    MVP (  mitral valve prolapse)    Near syncope 01/26/2023   Nodule of right lung    Osteoporosis of forearm    RBBB    Restrictive lung disease    Mild on PFT & likely cardiac in etiology    Right sided sciatica 04/10/2022   S/P Minimally invasive maze operation for atrial fibrillation 10/31/2015   Complete bilateral atrial lesion set using cryothermy and bipolar radiofrequency ablation with clipping of LA appendage via right mini thoracotomy approach   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  Admitted after unresponsiveness episode and possible seizure. Neuro okay to resume oral AC. Pharmacy consulted to start Apixaban as inpatient.  Plan: Start Eliquis 2.5mg  po BID for Afib stroke prevention (age > 80, Scr 1.51, Wt 65.5kg).  Corinne Goucher Rodriguez-Guzman PharmD, BCPS 04/30/2023 10:36 AM

## 2023-04-30 NOTE — Telephone Encounter (Signed)
Patient's son, Letasha Castelo, called to state patient has been in the ED again today.  [name] states he would like to reschedule patient's appointment.  I let [name] know that we do not have him on patient's DPR and he is not first on her Health Care Power of [address].  [name] states he should be first on patient's Health Care Power of [address].  He will fax a copy of the updated document to [place].  I let [name] know that we will be glad to help him with patient's appointment once we have the proper documentation.

## 2023-04-30 NOTE — ED Provider Notes (Signed)
Dr Iver Nestle of neurology has cleared from neuro standpoint - recommends psych evaluation (facility reportedly needs psych clearance as well) --though she was recently seen and cleared by psychiatry earlier this month. Patient remains stable, psychiatry consultation is placed at this time, voluntary.   Pilar Jarvis, MD 04/30/23 [phone]

## 2023-04-30 NOTE — Consult Note (Signed)
ANTICOAGULATION CONSULT NOTE - Initial Consult  Pharmacy Consult for heparin infusion Indication: atrial fibrillation  Allergies  Allergen Reactions   Codeine Nausea And Vomiting and Other (See Comments)    migraine   Epinephrine Palpitations and Shortness Of Breath   Hydrocodone Nausea And Vomiting and Other (See Comments)    MIGRAINE   Hydromorphone Nausea And Vomiting and Other (See Comments)    migraine   Molds & Smuts Anxiety, Other (See Comments) and Shortness Of Breath   Oxycodone Nausea And Vomiting and Other (See Comments)    Severe migraine   Meloxicam Other (See Comments)    Severe reflux   Codeine Other (See Comments)    Migraine   Dextromethorphan Other (See Comments)    seizures   Diphen [Diphenhydramine Hcl] Other (See Comments)    seizure   Diphenhydramine Other (See Comments)    seizure   Diphenhydramine Hcl     Other reaction(s): Other (See Comments)   Diphenylpyraline Other (See Comments)   Doxycycline Itching, Swelling and Other (See Comments)    Facial   Doxycycline Itching and Swelling   Hydrocodone Other (See Comments)    Migraine   Hydromorphone Other (See Comments)    Migraine   Meloxicam Other (See Comments)   Mobic [Meloxicam] Other (See Comments)    reflux   Nsaids    Nsaids Other (See Comments)    Pt on blood thinner   Oxycodone Other (See Comments)    Migraine   Propofol Other (See Comments)   Propofol Other (See Comments)    Very sensitive; patient stated she was told she was told she had apnea    Patient Measurements:   Heparin Dosing Weight: 65.8 kg  Vital Signs: Temp: 98.3 F (36.8 C) (05/29 0530) Temp Source: Oral (05/29 0530) BP: 110/57 (05/29 0500) Pulse Rate: 70 (05/29 0500)  Labs: Recent Labs    04/29/23 1500 04/29/23 2118 04/30/23 0534  HGB 9.0*  --  8.4*  HCT 29.1*  --  27.1*  PLT 125*  --  117*  APTT  --  34  --   HEPARINUNFRC  --  >1.10*  --   CREATININE 1.51*  --   --      Estimated Creatinine  Clearance: 25.8 mL/min (A) (by C-G formula based on SCr of 1.51 mg/dL (H)).   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 Ladona Ridgel   Cataract    Left eye   Chronic kidney disease (CKD) stage G3a/A2, moderately decreased glomerular filtration rate (GFR) between 45-59 mL/min/1.73 square meter and albuminuria creatinine ratio between 30-299 mg/g (HCC) 11/13/2021   Closed bilateral fracture of pubic rami (HCC) 08/28/2022   Diverticulosis    Focal seizures (HCC)    Heart murmur    Hematuria, gross 04/09/2018   Hemorrhoid    Hepatic cyst    innumerable   History of asbestos exposure    Hyperlipidemia    IBS (irritable bowel syndrome)    Idiopathic thrombocytopenic purpura (ITP) (HCC)  Migraine    MVP (mitral valve prolapse)    Near syncope 01/26/2023   Nodule of right lung    Osteoporosis of forearm    RBBB    Restrictive lung disease    Mild on PFT & likely cardiac in etiology    Right sided sciatica 04/10/2022   S/P Minimally invasive maze operation for atrial fibrillation 10/31/2015   Complete bilateral atrial lesion set using cryothermy and bipolar radiofrequency ablation with clipping of LA appendage via right mini thoracotomy approach   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  Assessment:  82 y.o. female   who has a history of anemia,chronic kidney disease, atrial fibrillation, seizures, SVT presented to [facility] ED with unresponsiveness following potentially stressful episode. Possible LP planned, plan to hold apixaban and transition to IV heparin. Pharmacy has been consulted to initiate and manage IV heparin therapy.    Baseline HL > 1.10, aPTT 34 s  Goal of Therapy:  Heparin level 0.3-0.7 units/ml Heparin level 66-102 units/ml Monitor platelets by anticoagulation protocol: Yes   05/29 0534 aPTT 183, supratherapeutic  / HL  Plan: Spoke with RN. Verbal Critical Value aPTT of 183.  Heparin level still not posted at this time. Hold infusion for 1 hr. Restart heparin infusion at 800 units/hr Recheck aPTT  level in 8 hours after restart Transition to HL monitoring once therapeutic and correlating with aPTT Continue to monitor H&H and platelets  Otelia Sergeant, PharmD, [facility] 04/30/2023 6:55 AM

## 2023-04-30 NOTE — ED Notes (Signed)
Pt able to swallow medications with applesauce without difficulty. Pt able to take sips of water from a straw without difficulty. No outward signs of aspiration present at this time.

## 2023-04-30 NOTE — ED Notes (Signed)
Neurology at bedside.

## 2023-04-30 NOTE — Telephone Encounter (Signed)
Pt son return [place] CMA call unable to transfer.   He also stated that pt its in the ED right now about to be discharged, and he would like to set a f/u with provider, however, theres no available opening until 6/20, he stated that's too far out, anything that can get his mom in sooner?

## 2023-04-30 NOTE — ED Notes (Signed)
PT at bedside.

## 2023-05-01 ENCOUNTER — Telehealth: Payer: Self-pay

## 2023-05-01 LAB — MISC LABCORP TEST (SEND OUT)
LabCorp test name: 716811
Labcorp test code: 716811

## 2023-05-01 LAB — URINE CULTURE

## 2023-05-01 NOTE — Telephone Encounter (Signed)
Spoke with pt's son and appt has been made.

## 2023-05-01 NOTE — Transitions of Care (Post Inpatient/ED Visit) (Signed)
Unable to reach [name] pts son and left v/m requesting cb [phone]. I spoke with [facility] and pt is presently at [facility] assisted living memory care.        05/01/2023  Name: [name] [name] MRN: [id number] DOB: [birth year]  Today's TOC FU Call Status: Today's TOC FU Call Status:: Unsuccessul Call (1st Attempt) Unsuccessful Call (1st Attempt) Date: 05/01/23  Attempted to reach the patient regarding the most recent Inpatient/ED visit.  Follow Up Plan: Additional outreach attempts will be made to reach the patient to complete the Transitions of Care (Post Inpatient/ED visit) call.   Signature Lewanda Rife, LPN

## 2023-05-02 ENCOUNTER — Encounter: Payer: Self-pay | Admitting: Internal Medicine

## 2023-05-02 ENCOUNTER — Telehealth: Payer: Self-pay | Admitting: Internal Medicine

## 2023-05-02 ENCOUNTER — Non-Acute Institutional Stay: Payer: Medicare Other | Admitting: Student

## 2023-05-02 ENCOUNTER — Encounter: Payer: Self-pay | Admitting: Student

## 2023-05-02 DIAGNOSIS — R296 Repeated falls: Secondary | ICD-10-CM | POA: Diagnosis not present

## 2023-05-02 DIAGNOSIS — R443 Hallucinations, unspecified: Secondary | ICD-10-CM

## 2023-05-02 DIAGNOSIS — I5022 Chronic systolic (congestive) heart failure: Secondary | ICD-10-CM

## 2023-05-02 DIAGNOSIS — N1831 Chronic kidney disease, stage 3a: Secondary | ICD-10-CM

## 2023-05-02 DIAGNOSIS — N3001 Acute cystitis with hematuria: Secondary | ICD-10-CM | POA: Diagnosis not present

## 2023-05-02 DIAGNOSIS — R825 Elevated urine levels of drugs, medicaments and biological substances: Secondary | ICD-10-CM

## 2023-05-02 NOTE — Transitions of Care (Post Inpatient/ED Visit) (Signed)
I spoke with [name], pts son. [name] said pt is presently at [facility] assisted living memory care for a safe environment due to pt having confusion. [name] said that pt mental status is about the same as when was seen in ED. Pt already has appt for HFU with Dr Darrick Huntsman on 05/08/23. [name] is waiting for appt with Dr Clelia Croft to discuss meds. [name] said pt still having problems with balance and vision. [name] said Dr Sydnee Cabal at [facility] is treating pt for UTI at this time. UC & ED precautions given and [name] voiced understanding. Sending note to Dr Darrick Huntsman.      05/02/2023  Name: [name] [name] MRN: [id number] DOB: [birth year]  Today's TOC FU Call Status: Today's TOC FU Call Status:: Unsuccessul Call (1st Attempt) Unsuccessful Call (1st Attempt) Date: 05/01/23 [facility] FU Call Complete Date: 05/02/23  Transition Care Management Follow-up Telephone Call Date of Discharge: 04/30/23 Discharge Facility: [facility]) Type of Discharge: Emergency Department Reason for ED Visit: Mental or Mood Disorder Mental or Mood Disorder Diagnosis:  (confusion) How have you been since you were released from the hospital?: Same [name] said pt is presently at [facility] assisted living memory care for a safe environment due to pt having confusion. [name] said  that pt mental status is about the same as when was seen in ED.) Any questions or concerns?: No  Items Reviewed: Did you receive and understand the discharge instructions provided?: Yes Medications obtained,verified, and reconciled?: Yes (Medications Reviewed) [name] said he understands pts meds and is waiting for appt with Dr Clelia Croft to discuss more about pts med.) Any new allergies since your discharge?: No Dietary orders reviewed?: NA Do you have support at home?: Yes People in Home: facility resident Name of Support/Comfort Primary Source: pt at [facility] assisted living memory care  Medications Reviewed Today: Medications Reviewed Today      Reviewed by Sharia Reeve, CPhT (Pharmacy Technician) on 04/30/23 at 1125  Med List Status: Complete   Medication Order Taking? Sig Documenting Provider Last Dose Status Informant  BRIVIACT 50 MG TABS [id number] Yes Take 50-100 mg by mouth 2 (two) times daily. 50 mg in AM, 100 mg HS [provider] 04/29/2023 Active Child, Pharmacy Records  butalbital-acetaminophen-caffeine (FIORICET) 50-325-40 MG tablet [id number] Yes TAKE 1 TABLET BY MOUTH ONCE DAILY AS NEEDED FOR BACK PAIN OR MIGRAINE MAX OF 3 TABS PER DAY Sherlene Shams, MD unknown Active Child, Pharmacy Records  calcium carbonate (CALCIUM 600) 600 MG TABS tablet [id number] Yes 600 mg 2 (two) times daily with a meal. [provider] 04/29/2023 Active Child, Pharmacy Records  Cholecalciferol (VITAMIN D) 2000 units CAPS [id number] Yes Take 2,000 Units by mouth daily. [provider] 04/29/2023 Active Child, Pharmacy Records  ELIQUIS 5 MG TABS tablet [id number] Yes TAKE ONE TABLET TWICE DAILY  Patient taking differently: Take 5 mg by mouth 2 (two) times daily.   Antonieta Iba, MD 04/29/2023 0800 Active Child, Pharmacy Records  fluticasone [facility]) 50 MCG/ACT nasal spray [id number] Yes USE 2 SPRAYS IN BOTH NOSTRILS DAILY  Patient taking differently: Place 2 sprays into both nostrils daily.   Sherlene Shams, MD 04/29/2023 Active Child, Pharmacy Records  folic acid (FOLVITE) 1 MG tablet [id number] Yes Take 1 mg by mouth daily. [provider] 04/29/2023 Active Child, Pharmacy Records  furosemide (LASIX) 40 MG tablet [id number] Yes TAKE ONE (1) TABLET BY MOUTH TWICE PER WEEK; TAKE AN ADDITIONAL HALF (1/2) TO ONE (1) TABLET  AS NEEDED FOR FLUID RETENTION  Patient taking differently: Take 40 mg by mouth 2 (two) times a week. TAKE ONE (1) TABLET BY MOUTH TWICE PER WEEK; TAKE AN ADDITIONAL HALF (1/2) TO ONE (1) TABLET AS NEEDED FOR FLUID RETENTION   Hammock, Sheri, NP Past Week Active Child, Pharmacy Records   gabapentin (NEURONTIN) 100 MG capsule [id number] Yes Take 200 mg by mouth 3 (three) times daily. [provider] 04/29/2023 Active Child, Pharmacy Records  lamoTRIgine (LAMICTAL) 150 MG tablet [id number] No Take 150 mg by mouth daily.  Patient not taking: Reported on 04/30/2023   [provider] Not Taking Active Child, Pharmacy Records  lamoTRIgine (LAMICTAL) 200 MG tablet [id number] Yes Take 200 mg by mouth at bedtime. [provider] Past Week Active Child, Pharmacy Records  losartan (COZAAR) 25 MG tablet [id number] Yes Take 0.5 tablets (12.5 mg total) by mouth daily. Antonieta Iba, MD 04/29/2023 Active Child, Pharmacy Records  metaxalone [facility]) 800 MG tablet [id number]  TAKE ONE TABLET BY MOUTH 3 TIMES DAILY  Patient not taking: Reported on 04/23/2023   Sherlene Shams, MD  Active Child, Pharmacy Records  metoprolol succinate (TOPROL-XL) 25 MG 24 hr tablet [id number] Yes TAKE ONE TABLET (25 MG) BY MOUTH EVERY DAY  Patient taking differently: Take 25 mg by mouth daily.   Antonieta Iba, MD 04/29/2023 Active Child, Pharmacy Records  montelukast (SINGULAIR) 10 MG tablet [id number] Yes TAKE ONE TABLET BY MOUTH AT BEDTIME  Patient taking differently: Take 10 mg by mouth at bedtime.   Sherlene Shams, MD Past Week Active Child, Pharmacy Records  polyethylene glycol powder [facility]) 17 GM/SCOOP powder [id number] Yes Take 1 Dose by mouth daily. [provider] unknown Active Child, Pharmacy Records  rosuvastatin (CRESTOR) 40 MG tablet [id number] Yes Take 1 tablet (40 mg total) by mouth daily. Sherlene Shams, MD Past Week Active Child, Pharmacy Records  spironolactone (ALDACTONE) 25 MG tablet [id number] Yes Take 0.5 tablets (12.5 mg total) by mouth every other day. Antonieta Iba, MD Past Week Active Child, Pharmacy Records  VENTOLIN HFA [phone]) MCG/ACT inhaler [id number] Yes INHALE 2 PUFFS INTO THE LUNGS EVERY 6 HOURS AS NEEDED FOR WHEEZING OR  SHORTNESS OF BREATH  Patient taking differently: Inhale 2 puffs into the lungs every 6 (six) hours as needed for wheezing or shortness of breath.   Sherlene Shams, MD unknown Active Child, Pharmacy Records  vitamin B-12 (CYANOCOBALAMIN) 1000 MCG tablet [id number] Yes Take 1,000 mcg by mouth daily. [provider] 04/29/2023 Active Child, Pharmacy Records            Home Care and Equipment/Supplies: Were Home Health Services Ordered?: NA Any new equipment or medical supplies ordered?: NA  Functional Questionnaire: Do you need assistance with bathing/showering or dressing?: Yes ([place] assist pt) Do you need assistance with meal preparation?: Yes [facility] assisting pt.) Do you need assistance with eating?: Yes (pt drinking about 4 oz of water a day.) Do you have difficulty maintaining continence: No Do you need assistance with getting out of bed/getting out of a chair/moving?: Yes [facility] assist pt) Do you have difficulty managing or taking your medications?: Yes ([place] assist pt with meds.)  Follow up appointments reviewed: PCP Follow-up appointment confirmed?: Yes MD Provider Line [phone] Given: Yes Date of PCP follow-up appointment?: 05/08/23 Follow-up Provider: Dr Darrick Huntsman [facility] Follow-up appointment confirmed?: NA [name] pts son is waiting on appt with Dr Clelia Croft to discuss med.) Do you need  transportation to your follow-up appointment?: No Do you understand care options if your condition(s) worsen?: Yes-patient verbalized understanding    SIGNATURE Lewanda Rife, LPN

## 2023-05-02 NOTE — Progress Notes (Signed)
Location:  Other [facility].  Nursing Home Room Number: [facility] 210B Place of Service:  ALF [phone]) Provider:  Dr. Earnestine Mealing  PCP: Sherlene Shams, MD  Patient Care Team: Sherlene Shams, MD as PCP - General (Internal Medicine) Marinus Maw, MD as PCP - Electrophysiology (Cardiology) Antonieta Iba, MD as PCP - Cardiology (Cardiology) Lemar Livings Merrily Pew, MD (General Surgery) Sherlene Shams, MD (Internal Medicine) Sherlene Shams, MD (Internal Medicine)  Extended Emergency Contact Information Primary Emergency Contact: Cambre,Brett Address: [address]          [place], [place] [address] [place] of [place] Mobile Phone: [phone] Relation: Son  Code Status:  Full Code Goals of care: Advanced Directive information    05/02/2023    9:09 AM  Advanced Directives  Does Patient Have a Medical Advance Directive? Yes  Type of Estate agent of [place];Living will  Does patient want to make changes to medical advance directive? No - Patient declined  Copy of Healthcare Power of Attorney in Chart? Yes - validated most recent copy scanned in chart (See row information)     Chief Complaint  Patient presents with   Acute Visit    UTI    HPI:  Pt is a 82 y.o. female seen today for an acute visit for UTI. Patient was seen in the emergency department for AMS and cultures have finalized for e. Faecalis.   Patient states her name, date of birth. States we are in [place] and it is May 2024. When asked about location she does mention she previously was at [facility] and now she is in a prison she will have to stay in. She denies dysuria. She describes an experience before hospitalization where she was asleep with her head covered and there were two women above her talking who never introduced themselves which was not normal and then she went to the emergency department. "My son thinks he is a doctor trying to make medication changes for  me." I didn't fall last night, I was crawling on the floor because I was too dizzy. You see I was still using my walker and pushed it to the bathroom. I couldn't get up because it was just too hard. I have to stay here until physical therpy says I've gottne better since the last time I was here. I guess I'll have to stay here until Thursday when I have my doctor's appointment. "Oh, I'm missing so many things my I pad is getting so many messages I can't keep up, and you see, that's why I've been foggy headed."   Spoke with son who aids with history. His mother was her normal self until the first ED visit. Unclear how much influence the seizure medication had on her transition to the ED. She doesn't wan to pull her cord anymore because she is scared someone will ask her to move to a higher level of care and she likes the independence of her home. She sees Dr. Sherryll Burger and Dr. Darrick Huntsman. She has a long history of having some anxiety. He states dehydration probably contributes as well. There are many days where he will stay with her for hours and she may drink 4-6 ounces of water in a day. She has had urinary tract infections before.   Nursing staff aided with history. Nursing states she told them she had cuts all over her feet and insisted she get bandaids, while there were no cuts present.   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 Ladona Ridgel   Cataract    Left eye   Chronic kidney disease (CKD) stage G3a/A2, moderately decreased glomerular filtration rate (GFR) between 45-59 mL/min/1.73 square meter and albuminuria  creatinine ratio between 30-299 mg/g (HCC) 11/13/2021   Closed bilateral fracture of pubic rami (HCC) 08/28/2022   Diverticulosis    Focal seizures (HCC)    Heart murmur    Hematuria, gross 04/09/2018   Hemorrhoid    Hepatic cyst    innumerable   History of asbestos exposure    Hyperlipidemia    IBS (irritable bowel syndrome)    Idiopathic thrombocytopenic purpura (ITP) (HCC)    Migraine    MVP (mitral valve prolapse)    Near syncope 01/26/2023   Nodule of right lung    Osteoporosis of forearm    RBBB    Restrictive lung disease    Mild on PFT & likely cardiac in etiology    Right sided sciatica 04/10/2022   S/P Minimally invasive maze operation for atrial fibrillation 10/31/2015   Complete bilateral atrial lesion set using cryothermy and bipolar radiofrequency ablation with clipping of LA appendage via right mini thoracotomy approach   S/P minimally invasive mitral valve replacement with bioprosthetic valve 10/31/2015   33 mm [facility] Mitral bovine bioprosthetic tissue valve placed via right mini thoracotomy approach   Seizures (HCC)    left foot paralysis and left hand paralysis Dr. 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   SVT (supraventricular tachycardia)    Thoracic aorta atherosclerosis (HCC)    TIA (transient ischemic attack)    Visit for preventive health examination 05/23/2016   Past Surgical History:  Procedure Laterality Date   BREAST EXCISIONAL BIOPSY Right Late 80s   Negative X2   CARDIAC CATHETERIZATION N/A 10/18/2015   Procedure: Right/Left Heart Cath and Coronary Angiography;  Surgeon: Tonny Bollman, MD;  Location: [facility] INVASIVE CV LAB;  Service: Cardiovascular;  Laterality: N/A;   CARDIOVERSION N/A 08/22/2015   Procedure: CARDIOVERSION;  Surgeon: Vesta Mixer, MD;  Location: [facility] ENDOSCOPY;  Service: Cardiovascular;  Laterality: N/A;   COLONOSCOPY  2003   EP IMPLANTABLE DEVICE N/A 11/06/2015    Procedure: Pacemaker Implant;  Surgeon: Marinus Maw, MD;  Location: [facility] INVASIVE CV LAB;  Service: Cardiovascular;  Laterality: N/A;   EP IMPLANTABLE DEVICE N/A 02/20/2016   Procedure: Lead Extraction;  Surgeon: Marinus Maw, MD;  Location: [facility] INVASIVE CV LAB;  Service: Cardiovascular;  Laterality: N/A;   EYE SURGERY Right 2013   FEMUR IM NAIL Left 06/18/2021   Procedure: INTRAMEDULLARY (IM) NAIL FEMORAL, OPEN REDUCTION INTERNAL FIXATION FEMUR RIGHT LITTLE FINGER PIP CLOSED REDUCTION;  Surgeon: Myrene Galas, MD;  Location: [facility] OR;  Service: Orthopedics;  Laterality: Left;   FOOT SURGERY     ~2007 right foot bunion   MANDIBLE FRACTURE SURGERY  03/26/2013   MINIMALLY INVASIVE MAZE PROCEDURE N/A 10/31/2015   Procedure: MINIMALLY INVASIVE MAZE  PROCEDURE;  Surgeon: Purcell Nails, MD;  Location: [facility] OR;  Service: Open Heart Surgery;  Laterality: N/A;   MITRAL VALVE REPLACEMENT Right 10/31/2015   Procedure: MINIMALLY INVASIVE MITRAL VALVE (MV) REPLACEMENT;  Surgeon: Purcell Nails, MD;  Location: [facility] OR;  Service: Open Heart Surgery;  Laterality: Right;   PACEMAKER LEAD REMOVAL  02/20/2016   TEE WITH CARDIOVERSION     TEE WITHOUT CARDIOVERSION N/A 08/22/2015   Procedure: TRANSESOPHAGEAL ECHOCARDIOGRAM (TEE);  Surgeon: Vesta Mixer, MD;  Location: [facility] ENDOSCOPY;  Service: Cardiovascular;  Laterality: N/A;   TEE WITHOUT CARDIOVERSION N/A 10/31/2015   Procedure: TRANSESOPHAGEAL ECHOCARDIOGRAM (TEE);  Surgeon: Purcell Nails, MD;  Location: [facility] OR;  Service: Open Heart Surgery;  Laterality: N/A;   TUBAL LIGATION     VARICOSE VEIN SURGERY Right     Allergies  Allergen Reactions   Codeine Nausea And Vomiting and Other (See Comments)    migraine   Epinephrine Palpitations and Shortness Of Breath   Hydrocodone Nausea And Vomiting and Other (See Comments)    MIGRAINE   Hydromorphone Nausea And Vomiting and Other (See Comments)    migraine   Molds & Smuts Anxiety, Other (See Comments) and  Shortness Of Breath   Oxycodone Nausea And Vomiting and Other (See Comments)    Severe migraine   Meloxicam Other (See Comments)    Severe reflux   Codeine Other (See Comments)    Migraine   Dextromethorphan Other (See Comments)    seizures   Diphen [Diphenhydramine Hcl] Other (See Comments)    seizure   Diphenhydramine Other (See Comments)    seizure   Diphenhydramine Hcl     Other reaction(s): Other (See Comments)   Diphenylpyraline Other (See Comments)   Doxycycline Itching, Swelling and Other (See Comments)    Facial   Doxycycline Itching and Swelling   Hydrocodone Other (See Comments)    Migraine   Hydromorphone Other (See Comments)    Migraine   Meloxicam Other (See Comments)   Mobic [Meloxicam] Other (See Comments)    reflux   Nsaids    Nsaids Other (See Comments)    Pt on blood thinner   Oxycodone Other (See Comments)    Migraine   Propofol Other (See Comments)   Propofol Other (See Comments)    Very sensitive; patient stated she was told she was told she had apnea    Outpatient Encounter Medications as of 05/02/2023  Medication Sig   acetaminophen (TYLENOL) 325 MG tablet Take 650 mg by mouth every 4 (four) hours as needed.   apixaban (ELIQUIS) 5 MG TABS tablet Take 5 mg by mouth 2 (two) times daily.   BRIVIACT 50 MG TABS Take 50 mg by mouth 2 (two) times daily. 50 mg in AM, 100 mg HS   calcium carbonate (CALCIUM 600) 600 MG TABS tablet 600 mg 2 (two) times daily with a meal.   Cholecalciferol (VITAMIN D) 2000 units CAPS Take 2,000 Units by mouth daily.   fluticasone (FLONASE) 50 MCG/ACT nasal spray USE 2 SPRAYS IN BOTH NOSTRILS DAILY   folic acid (FOLVITE) 1 MG tablet Take 1 mg by mouth daily.   furosemide (LASIX) 40 MG tablet Take 40 mg by mouth. One time a day every Monday and Thursday.   gabapentin (NEURONTIN) 100 MG capsule Take 200 mg by mouth 3 (three) times daily.   lamoTRIgine (LAMICTAL) 150 MG tablet Take 150 mg by mouth daily.   lamoTRIgine (LAMICTAL)  200 MG tablet Take 200 mg  by mouth at bedtime.   losartan (COZAAR) 25 MG tablet Take 0.5 tablets (12.5 mg total) by mouth daily.   metaxalone (SKELAXIN) 800 MG tablet Take 800 mg by mouth 3 (three) times daily as needed for muscle spasms.   metoprolol tartrate (LOPRESSOR) 25 MG tablet Take 25 mg by mouth daily.   montelukast (SINGULAIR) 10 MG tablet TAKE ONE TABLET BY MOUTH AT BEDTIME   polyethylene glycol powder (GLYCOLAX/MIRALAX) 17 GM/SCOOP powder Take 1 Dose by mouth daily.   rosuvastatin (CRESTOR) 40 MG tablet Take 1 tablet (40 mg total) by mouth daily.   spironolactone (ALDACTONE) 25 MG tablet Take 0.5 tablets (12.5 mg total) by mouth every other day.   VENTOLIN HFA 108 (90 Base) MCG/ACT inhaler INHALE 2 PUFFS INTO THE LUNGS EVERY 6 HOURS AS NEEDED FOR WHEEZING OR SHORTNESS OF BREATH   vitamin B-12 (CYANOCOBALAMIN) 1000 MCG tablet Take 1,000 mcg by mouth daily.   [DISCONTINUED] butalbital-acetaminophen-caffeine (FIORICET) 50-325-40 MG tablet TAKE 1 TABLET BY MOUTH ONCE DAILY AS NEEDED FOR BACK PAIN OR MIGRAINE MAX OF 3 TABS PER DAY   [DISCONTINUED] ELIQUIS 5 MG TABS tablet TAKE ONE TABLET TWICE DAILY (Patient taking differently: Take 5 mg by mouth 2 (two) times daily.)   [DISCONTINUED] furosemide (LASIX) 40 MG tablet TAKE ONE (1) TABLET BY MOUTH TWICE PER WEEK; TAKE AN ADDITIONAL HALF (1/2) TO ONE (1) TABLET AS NEEDED FOR FLUID RETENTION   [DISCONTINUED] lamoTRIgine (LAMICTAL) 150 MG tablet Take 150 mg by mouth daily. (Patient not taking: Reported on 04/30/2023)   [DISCONTINUED] metaxalone (SKELAXIN) 800 MG tablet TAKE ONE TABLET BY MOUTH 3 TIMES DAILY   [DISCONTINUED] metoprolol succinate (TOPROL-XL) 25 MG 24 hr tablet TAKE ONE TABLET (25 MG) BY MOUTH EVERY DAY   No facility-administered encounter medications on file as of 05/02/2023.    Review of Systems  Immunization History  Administered Date(s) Administered   Fluad Quad(high Dose 65+) 08/06/2019, 08/25/2020, 08/23/2021, 08/28/2022    Hep A / Hep B 05/09/2016, 06/11/2016, 11/12/2016   Influenza Split 10/10/2013   Influenza Whole 10/10/2011   Influenza, High Dose Seasonal PF 08/19/2016, 09/05/2017   Influenza,inj,Quad PF,6+ Mos 08/01/2014, 08/04/2015   Influenza-Unspecified 09/05/2017, 09/17/2018, 09/21/2019   Moderna Covid-19 Vaccine Bivalent Booster 40yrs & up 08/31/2021   Moderna Sars-Covid-2 Vaccination 12/16/2019, 01/13/2020, 10/17/2020, 03/16/2021   Pneumococcal Conjugate-13 01/10/2014   Pneumococcal Polysaccharide-23 06/25/2012   Tdap 06/25/2012   Zoster Recombinat (Shingrix) 04/24/2017, 08/26/2017   Zoster, Live 01/10/2006   Pertinent  Health Maintenance Due  Topic Date Due   INFLUENZA VACCINE  07/03/2023   DEXA SCAN  Completed      06/22/2022    9:00 PM 06/26/2022   11:37 AM 08/28/2022    4:24 PM 10/03/2022    1:56 PM 02/03/2023   11:01 AM  Fall Risk  Falls in the past year?  1 1  1   Was there an injury with Fall?  1 1  1   Fall Risk Category Calculator  2 2  3   Fall Risk Category (Retired)  Moderate Moderate    (RETIRED) Patient Fall Risk Level Low fall risk High fall risk High fall risk High fall risk   Patient at Risk for Falls Due to  History of fall(s) History of fall(s);Impaired balance/gait  History of fall(s)  Fall risk Follow up  Falls evaluation completed Falls evaluation completed  Falls evaluation completed   Functional Status Survey:    Vitals:   05/02/23 0850  Resp: 18  Temp: (!) 95.6 F (35.3  C)  SpO2: 96%  Weight: 144 lb (65.3 kg)  Height: 5\' 5"  (1.651 m)   Body mass index is 23.96 kg/m. Physical Exam  Labs reviewed: Recent Labs    04/19/23 1331 04/23/23 0127 04/29/23 1500  NA 137 136 141  K 3.7 4.2 3.2*  CL 101 100 102  CO2 27 28 28   GLUCOSE 98 113* 99  BUN 26* 27* 29*  CREATININE 1.12* 1.25* 1.51*  CALCIUM 9.4 9.0 9.4  MG  --   --  2.1   Recent Labs    04/17/23 0529 04/23/23 0127 04/29/23 1501  AST 40 50* 33  ALT 23 27 24   ALKPHOS 97 111 109  BILITOT 0.8  1.0 0.9  PROT 6.3* 6.5 6.1*  ALBUMIN 4.1 4.2 3.7   Recent Labs    04/03/23 1335 04/17/23 0529 04/19/23 1331 04/23/23 0127 04/29/23 1500 04/30/23 0534  WBC 3.8* 3.4*   < > 4.3 4.0 3.8*  NEUTROABS 2.6 2.2  --  3.1  --   --   HGB 8.9* 9.0*   < > 9.8* 9.0* 8.4*  HCT 28.0* 28.6*   < > 31.2* 29.1* 27.1*  MCV 93.0 95.0   < > 95.1 94.5 95.1  PLT 111* 91*   < > 107* 125* 117*   < > = values in this interval not displayed.   Lab Results  Component Value Date   TSH 4.842 (H) 04/29/2023   Lab Results  Component Value Date   HGBA1C 5.5 02/11/2020   Lab Results  Component Value Date   CHOL 143 05/21/2019   HDL 59.50 05/21/2019   LDLCALC 71 05/21/2019   LDLDIRECT 113.0 02/15/2016   TRIG 60.0 05/21/2019   CHOLHDL 2 05/21/2019    Significant Diagnostic Results in last 30 days:  DG Chest Port 1 View  Result Date: 04/29/2023 CLINICAL DATA:  [id number] with altered mental status, unresponsive at [facility] today. EXAM: PORTABLE CHEST 1 VIEW COMPARISON:  Portable chest 04/23/2023 FINDINGS: Aagain noted left chest dual lead pacing system with stable wire insertions, left atrial appendage closure device and metallic mitral valve prosthesis. Moderate to severe cardiomegaly is also again seen with the interval development of central vascular prominence and interstitial edema. There are perihilar alveolar opacities which could be due to alveolar edema, pneumonia or combination. Small pleural effusions are forming but no substantial pleural effusion at this time. The mediastinum is stable with aortic tortuosity and calcific plaques. Osteopenia and thoracic spondylosis. IMPRESSION: 1. Moderate to severe cardiomegaly with central vascular prominence and interstitial edema, consistent with CHF or fluid overload. 2. Perihilar alveolar opacities which could be due to alveolar edema, pneumonia or combination. 3. Small pleural effusions. 4. Aortic atherosclerosis and uncoiling. Electronically Signed   By: Almira Bar M.D.   On: 04/29/2023 20:17   EEG adult  Result Date: 04/29/2023 Charlsie Quest, MD     04/29/2023  5:15 PM Patient Name: APONI GROOMES MRN: [id number] Epilepsy Attending: Charlsie Quest Referring Physician/Provider: Gordy Councilman, MD Date: 04/29/2023 Duration: 26.58 mins Patient history: 82yo F after an episode of unresponsiveness. EEG to evaluate for seizure Level of alertness: Awake, asleep AEDs during EEG study: None Technical aspects: This EEG study was done with scalp electrodes positioned according to the 10-20 International system of electrode placement. Electrical activity was reviewed with band pass filter of 1-70Hz , sensitivity of 7 uV/mm, display speed of 47mm/sec with a 60Hz  notched filter applied as appropriate. EEG data were recorded continuously and  digitally stored.  Video monitoring was available and reviewed as appropriate. Description: The posterior dominant rhythm consists of 8-9 Hz activity of moderate voltage (25-35 uV) seen predominantly in posterior head regions, symmetric and reactive to eye opening and eye closing. Sleep was characterized by vertex waves, sleep spindles (12 to 14 Hz), maximal frontocentral region. Hyperventilation and photic stimulation were not performed.   IMPRESSION: This study is within normal limits. No seizures or epileptiform discharges were seen throughout the recording. A normal interictal EEG does not exclude the diagnosis of epilepsy. Charlsie Quest   CT Head Wo Contrast  Result Date: 04/29/2023 CLINICAL DATA:  Delirium, found down EXAM: CT HEAD WITHOUT CONTRAST TECHNIQUE: Contiguous axial images were obtained from the base of the skull through the vertex without intravenous contrast. RADIATION DOSE REDUCTION: This exam was performed according to the departmental dose-optimization program which includes automated exposure control, adjustment of the mA and/or kV according to patient size and/or use of iterative reconstruction technique.  COMPARISON:  04/23/2023 FINDINGS: Brain: Normal anatomic configuration. Parenchymal volume is relatively well preserved given the patient's age. Mild periventricular white matter changes are present likely reflecting the sequela of small vessel ischemia. No abnormal intra or extra-axial mass lesion or fluid collection. No abnormal mass effect or midline shift. No evidence of acute intracranial hemorrhage or infarct. Ventricular size is normal. Cerebellum unremarkable. Vascular: No asymmetric hyperdense vasculature at the skull base. Skull: Intact Sinuses/Orbits: Paranasal sinuses are clear. Orbits are unremarkable. Other: Mastoid air cells and middle ear cavities are clear. IMPRESSION: 1. No acute intracranial hemorrhage or infarct. 2. Mild senescent change. Electronically Signed   By: Helyn Numbers M.D.   On: 04/29/2023 15:35   CT ABDOMEN PELVIS W CONTRAST  Result Date: 04/23/2023 CLINICAL DATA:  Nausea and vomiting.  Diffuse abdominal pain EXAM: CT ABDOMEN AND PELVIS WITH CONTRAST TECHNIQUE: Multidetector CT imaging of the abdomen and pelvis was performed using the standard protocol following bolus administration of intravenous contrast. RADIATION DOSE REDUCTION: This exam was performed according to the departmental dose-optimization program which includes automated exposure control, adjustment of the mA and/or kV according to patient size and/or use of iterative reconstruction technique. CONTRAST:  75mL OMNIPAQUE IOHEXOL 300 MG/ML  SOLN COMPARISON:  01/26/2023 FINDINGS: Lower chest: Cardiomegaly with right ventricular pacer lead and mitral valve replacement, partially covered. No acute finding in the lower chest Hepatobiliary: Innumerable liver cysts accentuated around the gallbladder fossa. No evidence of biliary calcification or obstruction. Pancreas: Unremarkable. Spleen: Unremarkable. Adrenals/Urinary Tract: Negative adrenals. No hydronephrosis or stone. 3 cm right renal cyst. No follow-up imaging is  recommended given simple appearance. Unremarkable bladder. Stomach/Bowel: No obstruction. Generalized stool desiccation. No evidence of bowel inflammation. Vascular/Lymphatic: No acute vascular abnormality. Scattered atheromatous calcification. No mass or adenopathy. Reproductive:No pathologic findings. Other: No ascites or pneumoperitoneum. Shallow right groin hernia containing trace ascitic fluid. Musculoskeletal: Remote right lower rib fractures. Chronic sacral insufficiency fractures with bilateral and midline sclerosis. Chronic right sacral ala and right pubic body fractures with incomplete bony bridging at the ala. Postoperative proximal left femur. Lumbar spine degeneration is severe with pronounced dextroscoliosis. IMPRESSION: 1. No acute finding. 2. Numerous chronic findings are listed above. Electronically Signed   By: Tiburcio Pea M.D.   On: 04/23/2023 05:44   DG Chest Portable 1 View  Result Date: 04/23/2023 CLINICAL DATA:  Altered, bizarre behavior EXAM: PORTABLE CHEST 1 VIEW COMPARISON:  Chest radiograph 03/18/2023 FINDINGS: Stable cardiomegaly. Left atrial appendage occlusion device. AVR. Left chest wall pacemaker. Chronic interstitial  coarsening. Chronic scar right mid lung. No pleural effusion or pneumothorax. No displaced rib fractures. IMPRESSION: No active disease. Cardiomegaly and chronic interstitial coarsening. Electronically Signed   By: Minerva Fester M.D.   On: 04/23/2023 02:02   CT HEAD WO CONTRAST ( )  Result Date: 04/23/2023 CLINICAL DATA:  Bizarre behavior on Eliquis. EXAM: CT HEAD WITHOUT CONTRAST TECHNIQUE: Contiguous axial images were obtained from the base of the skull through the vertex without intravenous contrast. RADIATION DOSE REDUCTION: This exam was performed according to the departmental dose-optimization program which includes automated exposure control, adjustment of the mA and/or kV according to patient size and/or use of iterative reconstruction technique.  COMPARISON:  CT 04/19/2023 FINDINGS: Brain: No intracranial hemorrhage, mass effect, or evidence of acute infarct. No hydrocephalus. No extra-axial fluid collection. Mild generalized cerebral atrophy. Vascular: No hyperdense vessel. Intracranial arterial calcification. Skull: No fracture or focal lesion. Sinuses/Orbits: No acute finding. Frothy mucous in the left sphenoid sinus. Paranasal sinuses and mastoid air cells are otherwise well aerated. Other: None. IMPRESSION: 1. No acute intracranial abnormality. Electronically Signed   By: Minerva Fester M.D.   On: 04/23/2023 01:46   CT ANGIO HEAD NECK W WO CM  Result Date: 04/19/2023 CLINICAL DATA:  No deficit, acute, stroke suspected. Sudden onset of weakness the neck down. Unsteady gait. EXAM: CT ANGIOGRAPHY HEAD AND NECK WITH AND WITHOUT CONTRAST TECHNIQUE: Multidetector CT imaging of the head and neck was performed using the standard protocol during bolus administration of intravenous contrast. Multiplanar CT image reconstructions and MIPs were obtained to evaluate the vascular anatomy. Carotid stenosis measurements (when applicable) are obtained utilizing NASCET criteria, using the distal internal carotid diameter as the denominator. RADIATION DOSE REDUCTION: This exam was performed according to the departmental dose-optimization program which includes automated exposure control, adjustment of the mA and/or kV according to patient size and/or use of iterative reconstruction technique. CONTRAST:  75mL OMNIPAQUE IOHEXOL 350 MG/ML SOLN COMPARISON:  CT head without contrast/16/24 FINDINGS: CT HEAD FINDINGS Brain: No acute infarct, hemorrhage, or mass lesion is present. No significant white matter lesions are present. Deep brain nuclei are within normal limits. The ventricles are of normal size. No significant extraaxial fluid collection is present. The brainstem and cerebellum are within normal limits. Midline structures are within normal limits. Vascular:  Atherosclerotic calcifications are present within the cavernous internal carotid arteries bilaterally. No hyperdense vessel is present. Skull: Calvarium is intact. No focal lytic or blastic lesions are present. No significant extracranial soft tissue lesion is present. Sinuses/Orbits: The paranasal sinuses and mastoid air cells are clear. Bilateral lens replacements are noted. Globes and orbits are otherwise unremarkable. Other: Review of the MIP images confirms the above findings CTA NECK FINDINGS Aortic arch: A 3 vessel arch configuration is present. Atherosclerotic calcifications are present at the origin of the left subclavian artery without significant stenosis. Origins of the innominate artery and left common carotid artery are normal. Right carotid system: Right common carotid artery is within normal limits. Minimal calcifications present bifurcation. No significant stenosis is present. The cervical right ICA is otherwise normal. Left carotid system: The left common carotid artery is within normal limits. Atherosclerotic calcifications are present at the carotid bifurcation. Mild tortuosity is present the cervical left ICA without significant stenosis. Vertebral arteries: The right vertebral artery is slightly dominant to the left. Both vertebral arteries originate from the subclavian arteries without significant stenosis. No significant stenosis is present in either vertebral artery in the neck. Skeleton: The vertebral body heights and alignment  are normal. Asymmetric right-sided facet degenerative changes are present in the upper cervical spine. Mandible ORIF present. No focal osseous lesions are present. Other neck: Soft tissues the neck are otherwise unremarkable. Salivary glands are within normal limits. Thyroid is normal. No significant adenopathy is present. No focal mucosal or submucosal lesions are present. Upper chest: Right greater than left pleural effusions are present. Patchy ground-glass  attenuation is present at both lungs. Review of the MIP images confirms the above findings CTA HEAD FINDINGS Anterior circulation: Atherosclerotic calcifications are present within the cavernous internal carotid arteries bilaterally without significant stenosis through the ICA termini. The A1 and M1 segments are normal. The anterior communicating artery is patent. MCA bifurcations are within normal limits. The ACA and MCA branch vessels are normal. Aneurysm is present. Posterior circulation: The right vertebral artery is dominant. The PICA origins are visualized and normal. The vertebrobasilar junction and basilar artery is normal. The superior cerebellar arteries are within normal limits bilaterally posterior cerebral arteries originate from the basilar tip. The PCA branch vessels. Venous sinuses: The dural sinuses are patent. The straight sinus and deep cerebral veins are intact. Cortical veins are within normal limits. No significant vascular malformation is evident. Anatomic variants: None Review of the MIP images confirms the above findings IMPRESSION: 1. No acute or focal lesion to explain the patient's symptoms. 2. Atherosclerotic changes at the carotid bifurcations and cavernous internal carotid arteries bilaterally without significant stenosis. 3. Normal variant CTA Circle of Willis without significant proximal stenosis, aneurysm, or branch vessel occlusion. 4. Right greater than left pleural effusions. 5. Patchy ground-glass attenuation at both lungs. This is nonspecific, but can be seen in the setting of edema or infection. 6. Asymmetric right-sided facet degenerative changes in the upper cervical spine. Electronically Signed   By: Marin Roberts M.D.   On: 04/19/2023 15:52   CT Cervical Spine Wo Contrast  Result Date: 04/17/2023 CLINICAL DATA:  82 year old female status post fall at 2300 hours. Struck head. EXAM: CT CERVICAL SPINE WITHOUT CONTRAST TECHNIQUE: Multidetector CT imaging of the  cervical spine was performed without intravenous contrast. Multiplanar CT image reconstructions were also generated. RADIATION DOSE REDUCTION: This exam was performed according to the departmental dose-optimization program which includes automated exposure control, adjustment of the mA and/or kV according to patient size and/or use of iterative reconstruction technique. COMPARISON:  Head CT today.  Cervical spine CT 03/26/2013. FINDINGS: Alignment: Loss of some cervical lordosis since 2014. Chronic mild dextroconvex cervical and more pronounced levoconvex upper thoracic scoliosis. Cervicothoracic junction alignment is within normal limits. Bilateral posterior element alignment is within normal limits. Skull base and vertebrae: Chronic TMJ degeneration. Visualized skull base is intact. No atlanto-occipital dissociation. C1 and C2 appear intact and aligned. No acute osseous abnormality identified. Soft tissues and spinal canal: No prevertebral fluid or swelling. No visible canal hematoma. Largely negative for age noncontrast neck soft tissues, left greater than right carotid bifurcation calcified atherosclerosis. Disc levels: Cervical spine degeneration is mild for age. Capacious spinal canal. 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 Levoconvex upper thoracic scoliosis apical pulmonary septal thickening does not appear significantly changed from 2014. IMPRESSION: 1. No acute traumatic injury identified in the cervical spine. 2. Chronic scoliosis but mild for age cervical spine degeneration. Electronically Signed   By: Odessa Fleming M.D.   On: 04/17/2023 06:05   CT PELVIS WO CONTRAST  Result Date: 04/17/2023 CLINICAL DATA:  82 year old female with hip trauma. Suspected fracture. EXAM: CT PELVIS WITHOUT CONTRAST TECHNIQUE:  Multidetector CT imaging of the pelvis was performed following the standard protocol without intravenous contrast. RADIATION DOSE REDUCTION: This exam was performed according to  the departmental dose-optimization program which includes automated exposure control, adjustment of the mA and/or kV according to patient size and/or use of iterative reconstruction technique. COMPARISON:  CT of the abdomen and pelvis 01/26/2023. FINDINGS: Urinary Tract:  No abnormality visualized. Bowel:  Unremarkable visualized pelvic bowel loops. 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 No definite lymphadenopathy noted in the pelvis. Reproductive:  Uterus and ovaries are atrophic. Other: No significant volume of ascites and no pneumoperitoneum noted in the visualized portions of the peritoneal cavity. Musculoskeletal: Postoperative changes of ORIF are again noted in the left proximal femur, with no periprosthetic fracture or immediate complicating features. No acute displaced fracture of the bony pelvis is identified. However, there are multiple chronic healing fractures of the bony pelvis including a right parasymphyseal fracture, mildly displaced fracture of the right inferior pubic ramus, nondisplaced fracture of the right superior pubic ramus, and nondisplaced fractures of the sacral ala bilaterally. Areas of increasing sclerosis and callus formation are noted in all of these regions when compared to the recent prior study. IMPRESSION: 1. No acute abnormality of the bony pelvis. 2. Multiple pelvic fractures redemonstrated, as detailed above, similar to the prior study showing some interval healing compared to the prior examination. 3. Status post ORIF in the left proximal femur with no periprosthetic fracture or other acute complicating features. 4. Aortic atherosclerosis. Electronically Signed   By: Trudie Reed M.D.   On: 04/17/2023 06:05   CT HEAD WO CONTRAST ( )  Result Date: 04/17/2023 CLINICAL DATA:  82 year old female status post fall  at 2300 hours. Struck head. EXAM: CT HEAD WITHOUT CONTRAST TECHNIQUE: Contiguous axial images were obtained from the base of the skull through the vertex without intravenous contrast. RADIATION DOSE REDUCTION: This exam was performed according to the departmental dose-optimization program which includes automated exposure control, adjustment of the mA and/or kV according to patient size and/or use of iterative reconstruction technique. COMPARISON:  Head CT 01/26/2023. FINDINGS: Brain: No midline shift, ventriculomegaly, mass effect, evidence of mass lesion, intracranial hemorrhage or evidence of cortically based acute infarction. Cerebral volume, gray-white differentiation appears stable and within normal limits for age. Vascular: Calcified atherosclerosis at the skull base. No suspicious intracranial vascular hyperdensity. Skull: Stable.  No acute osseous abnormality identified. Sinuses/Orbits: Visualized paranasal sinuses and mastoids are stable and well aerated. Other: No discrete orbit or scalp soft tissue injury identified. IMPRESSION: No acute traumatic injury identified. Stable and normal for age noncontrast CT appearance of the brain. Electronically Signed   By: Odessa Fleming M.D.   On: 04/17/2023 06:02   VAS [place] LOWER EXTREMITY VENOUS (DVT)  Result Date: 04/09/2023  Lower Venous DVT Study Patient Name:  MAKINZIE BIERI  Date of Exam:   04/09/2023 Medical Rec #: [id number]        Accession #:    [id number] Date of Birth: November 21, 1941       Patient Gender: F Patient Age:   15 years Exam Location:  [facility] Procedure:      VAS [place] LOWER EXTREMITY VENOUS (DVT) Referring Phys: Julien Nordmann --------------------------------------------------------------------------------  Indications: Swelling.  Risk Factors: None identified. Limitations: Edema. Performing Technologist: Rolland Porter  Examination Guidelines: A complete evaluation includes B-mode imaging, spectral Doppler, color Doppler, and power Doppler  as needed of all  accessible portions of each vessel. Bilateral testing is considered an integral part of a complete examination. Limited examinations for reoccurring indications may be performed as noted. The reflux portion of the exam is performed with the patient in reverse Trendelenburg.  +---------+---------------+---------+-----------+----------+--------------+ RIGHT    CompressibilityPhasicitySpontaneityPropertiesThrombus Aging +---------+---------------+---------+-----------+----------+--------------+ CFV      Full                                                        +---------+---------------+---------+-----------+----------+--------------+ SFJ      Full                                                        +---------+---------------+---------+-----------+----------+--------------+ FV Prox  Full                                                        +---------+---------------+---------+-----------+----------+--------------+ FV Mid   Full                                                        +---------+---------------+---------+-----------+----------+--------------+ FV DistalFull                                                        +---------+---------------+---------+-----------+----------+--------------+ PFV      Full                                                        +---------+---------------+---------+-----------+----------+--------------+ POP      Full                                                        +---------+---------------+---------+-----------+----------+--------------+ PTV      Full                                                        +---------+---------------+---------+-----------+----------+--------------+ PERO     Full                                                        +---------+---------------+---------+-----------+----------+--------------+  Gastroc  Full                                                         +---------+---------------+---------+-----------+----------+--------------+ GSV      Full                                                        +---------+---------------+---------+-----------+----------+--------------+ SSV      Full                                                        +---------+---------------+---------+-----------+----------+--------------+   +----+---------------+---------+-----------+----------+--------------+ LEFTCompressibilityPhasicitySpontaneityPropertiesThrombus Aging +----+---------------+---------+-----------+----------+--------------+ CFV Full                                                        +----+---------------+---------+-----------+----------+--------------+    Summary: RIGHT: - No evidence of deep vein thrombosis in the lower extremity. No indirect evidence of obstruction proximal to the inguinal ligament.  LEFT: - No evidence of common femoral vein obstruction.  *See table(s) above for measurements and observations. Electronically signed by Nanetta Batty MD on 04/09/2023 at 5:01:53 PM.    Final     Assessment/Plan Acute cystitis with hematuria  Positive urine drug screen  Hallucinations  Frequent falls  Chronic systolic congestive heart failure (HCC)  Chronic kidney disease (CKD) stage G3a/A2, moderately decreased glomerular filtration rate (GFR) between 45-59 mL/min/1.73 square meter and albuminuria creatinine ratio between 30-299 mg/g [facility]) Patient admitted [facility]. Of note, patient has had 4 ED visits in the last month for Dizziness, Fall, Weakness, Confusion and lastly altered mental status. She has had evaluation in the ED, however, during evaluation here patient has told nursing that she sees cuts on her feet and needs bandages without them being present. Possible contributing factors for confusion include acute intoxication (positive opioid screen), urinary tract infection (confirmed on culture),  psychiatric decompensation, medication misuse. Patient was evaluated by neurology and psychiatry in the ED and cleared for discharge. She continued to endorse loneliness during ED visits. Previous side effect was Keppra which was discontinued due to sleepiness. 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 Patient had evaluation by psychiatry. At the time of those evaluations she was not having hallucination, however, was disheveled and had poor eye contact. On my exam, patient is disheveled, disoriented, with poor eye contact and tangential conversation.   Sent message to PCP due to concern for need of IVC and my short relationship with patient and she states she agrees changes are an acute change for her in the last month and once she has completed treatment for UTI will pursue IVC if she has persistent hallucinations and continues to unknowingly be a danger to herself.   Of note, [facility] has an MRI  machine that can perform imaging despite pace maker which may be beneficial to determine if there are any other underlying causes.   Family/ staff Communication: Patient's son, [place] nurse, campus [place] nurse, social work   Labs/tests ordered:  none   I spent greater than 75 minutes for the care of this patient in face to face time, chart review, clinical documentation, patient education, Communication with PCP, Communication with family. Communication with specialists.

## 2023-05-02 NOTE — Telephone Encounter (Signed)
Error

## 2023-05-05 ENCOUNTER — Non-Acute Institutional Stay: Payer: Medicare Other | Admitting: Student

## 2023-05-05 ENCOUNTER — Ambulatory Visit: Payer: Medicare Other | Admitting: Cardiology

## 2023-05-05 ENCOUNTER — Encounter: Payer: Self-pay | Admitting: Student

## 2023-05-05 DIAGNOSIS — R413 Other amnesia: Secondary | ICD-10-CM | POA: Diagnosis not present

## 2023-05-05 DIAGNOSIS — R296 Repeated falls: Secondary | ICD-10-CM | POA: Diagnosis not present

## 2023-05-05 DIAGNOSIS — I5022 Chronic systolic (congestive) heart failure: Secondary | ICD-10-CM | POA: Diagnosis not present

## 2023-05-05 DIAGNOSIS — N1831 Chronic kidney disease, stage 3a: Secondary | ICD-10-CM

## 2023-05-05 NOTE — Progress Notes (Signed)
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 present within the cavernous internal carotid arteries bilaterally without significant stenosis through the ICA termini. The A1 and M1 segments are normal. The anterior communicating artery is patent. MCA bifurcations are within normal limits. The ACA and MCA branch vessels are normal. Aneurysm is present. Posterior circulation: The right vertebral artery is dominant. The PICA origins are visualized and normal. The vertebrobasilar junction and basilar artery is normal. The superior cerebellar arteries are within normal limits bilaterally posterior cerebral arteries originate from the basilar tip. The PCA branch vessels. Venous sinuses: The dural sinuses are patent. The straight sinus and deep cerebral veins are intact. Cortical veins are within normal limits. No significant vascular malformation is evident. Anatomic variants: None Review of the MIP images confirms the above findings IMPRESSION: 1. No acute or focal lesion  to explain the patient's symptoms. 2. Atherosclerotic changes at the carotid bifurcations and cavernous  internal carotid arteries bilaterally without significant stenosis. 3. Normal variant CTA Circle of Willis without significant proximal stenosis, aneurysm, or branch vessel occlusion. 4. Right greater than left pleural effusions. 5. Patchy ground-glass attenuation at both lungs. This is nonspecific, but can be seen in the setting of edema or infection. 6. Asymmetric right-sided facet degenerative changes in the upper cervical spine. Electronically Signed   By: Marin Roberts M.D.   On: 04/19/2023 15:52   CT Cervical Spine Wo Contrast  Result Date: 04/17/2023 CLINICAL DATA:  82 year old female status post fall at 2300 hours. Struck head. EXAM: CT CERVICAL SPINE WITHOUT CONTRAST TECHNIQUE: Multidetector CT imaging of the cervical spine was performed without intravenous contrast. Multiplanar CT image reconstructions were also generated. RADIATION DOSE REDUCTION: This exam was performed according to the departmental dose-optimization program which includes automated exposure control, adjustment of the mA and/or kV according to patient size and/or use of iterative reconstruction technique. COMPARISON:  Head CT today.  Cervical spine CT 03/26/2013. FINDINGS: Alignment: Loss of some cervical lordosis since 2014. Chronic mild dextroconvex cervical and more pronounced levoconvex upper thoracic scoliosis. Cervicothoracic junction alignment is within normal limits. Bilateral posterior element alignment is within normal limits. Skull base and vertebrae: Chronic TMJ degeneration. Visualized skull base is intact. No atlanto-occipital dissociation. C1 and C2 appear intact and aligned. No acute osseous abnormality identified. Soft tissues and spinal canal: No prevertebral fluid or swelling. No visible canal hematoma. Largely negative for age noncontrast neck soft tissues, left greater than right carotid bifurcation calcified atherosclerosis. Disc levels: Cervical spine degeneration is mild for age. Capacious spinal canal.  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 Levoconvex upper thoracic scoliosis apical pulmonary septal thickening does not appear significantly changed from 2014. IMPRESSION: 1. No acute traumatic injury identified in the cervical spine. 2. Chronic scoliosis but mild for age cervical spine degeneration. Electronically Signed   By: Odessa Fleming M.D.   On: 04/17/2023 06:05   CT PELVIS WO CONTRAST  Result Date: 04/17/2023 CLINICAL DATA:  82 year old female with hip trauma. Suspected fracture. EXAM: CT PELVIS WITHOUT CONTRAST TECHNIQUE: Multidetector CT imaging of the pelvis was performed following the standard protocol without intravenous contrast. RADIATION DOSE REDUCTION: This exam was performed according to the departmental dose-optimization program which includes automated exposure control, adjustment of the mA and/or kV according to patient size and/or use of iterative reconstruction technique. COMPARISON:  CT of the abdomen and pelvis 01/26/2023. FINDINGS: Urinary Tract:  No abnormality visualized. Bowel:  Unremarkable visualized pelvic bowel loops. 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 No definite lymphadenopathy noted in the pelvis. Reproductive:  Uterus and ovaries are atrophic. Other: No significant volume of ascites and no pneumoperitoneum noted in the visualized portions of the peritoneal cavity. Musculoskeletal: Postoperative changes of ORIF are again noted in the left proximal femur, with no periprosthetic fracture or immediate complicating features. No acute displaced fracture of the bony pelvis is identified. However, there are multiple chronic healing fractures of the bony pelvis including a right parasymphyseal fracture, mildly displaced fracture of the right inferior pubic ramus, nondisplaced fracture of the right  superior pubic ramus, and nondisplaced fractures of the sacral ala bilaterally. Areas of increasing sclerosis and callus formation are noted in all of these regions when compared to the recent prior study.  IMPRESSION: 1. No acute abnormality of the bony pelvis. 2. Multiple pelvic fractures redemonstrated, as detailed above, similar to the prior study showing some interval healing compared to the prior examination. 3. Status post ORIF in the left proximal femur with no periprosthetic fracture or other acute complicating features. 4. Aortic atherosclerosis. Electronically Signed   By: Trudie Reed M.D.   On: 04/17/2023 06:05   CT HEAD WO CONTRAST ( )  Result Date: 04/17/2023 CLINICAL DATA:  82 year old female status post fall at 2300 hours. Struck head. EXAM: CT HEAD WITHOUT CONTRAST TECHNIQUE: Contiguous axial images were obtained from the base of the skull through the vertex without intravenous contrast. RADIATION DOSE REDUCTION: This exam was performed according to the departmental dose-optimization program which includes automated exposure control, adjustment of the mA and/or kV according to patient size and/or use of iterative reconstruction technique. COMPARISON:  Head CT 01/26/2023. FINDINGS: Brain: No midline shift, ventriculomegaly, mass effect, evidence of mass lesion, intracranial hemorrhage or evidence of cortically based acute infarction. Cerebral volume, gray-white differentiation appears stable and within normal limits for age. Vascular: Calcified atherosclerosis at the skull base. No suspicious intracranial vascular hyperdensity. Skull: Stable.  No acute osseous abnormality identified. Sinuses/Orbits: Visualized paranasal sinuses and mastoids are stable and well aerated. Other: No discrete orbit or scalp soft tissue injury identified. IMPRESSION: No acute traumatic injury identified. Stable and normal for age noncontrast CT appearance of the brain. Electronically Signed   By: Odessa Fleming M.D.    On: 04/17/2023 06:02   VAS [place] LOWER EXTREMITY VENOUS (DVT)  Result Date: 04/09/2023  Lower Venous DVT Study Patient Name:  ANARAH DENBY  Date of Exam:   04/09/2023 Medical Rec #: [id number]        Accession #:    [id number] Date of Birth: [birth year]       Patient Gender: F Patient Age:   33 years Exam Location:   Procedure:      VAS [place] LOWER EXTREMITY VENOUS (DVT) Referring Phys: Julien Nordmann --------------------------------------------------------------------------------  Indications: Swelling.  Risk Factors: None identified. Limitations: Edema. Performing Technologist: Rolland Porter  Examination Guidelines: A complete evaluation includes B-mode imaging, spectral Doppler, color Doppler, and power Doppler as needed of all accessible portions of each vessel. Bilateral testing is considered an integral part of a complete examination. Limited examinations for reoccurring indications may be performed as noted. The reflux portion of the exam is performed with the patient in reverse Trendelenburg.  +---------+---------------+---------+-----------+----------+--------------+ RIGHT    CompressibilityPhasicitySpontaneityPropertiesThrombus Aging +---------+---------------+---------+-----------+----------+--------------+ CFV      Full                                                        +---------+---------------+---------+-----------+----------+--------------+ SFJ      Full                                                        +---------+---------------+---------+-----------+----------+--------------+ FV Prox  Full                                                        +---------+---------------+---------+-----------+----------+--------------+  FV Mid   Full                                                        +---------+---------------+---------+-----------+----------+--------------+ FV DistalFull                                                         +---------+---------------+---------+-----------+----------+--------------+ PFV      Full                                                        +---------+---------------+---------+-----------+----------+--------------+ POP      Full                                                        +---------+---------------+---------+-----------+----------+--------------+ PTV      Full                                                        +---------+---------------+---------+-----------+----------+--------------+ PERO     Full                                                        +---------+---------------+---------+-----------+----------+--------------+ Gastroc  Full                                                        +---------+---------------+---------+-----------+----------+--------------+ GSV      Full                                                        +---------+---------------+---------+-----------+----------+--------------+ SSV      Full                                                        +---------+---------------+---------+-----------+----------+--------------+   +----+---------------+---------+-----------+----------+--------------+ LEFTCompressibilityPhasicitySpontaneityPropertiesThrombus Aging +----+---------------+---------+-----------+----------+--------------+ CFV Full                                                        +----+---------------+---------+-----------+----------+--------------+  Summary: RIGHT: - No evidence of deep vein thrombosis in the lower extremity. No indirect evidence of obstruction proximal to the inguinal ligament.  LEFT: - No evidence of common femoral vein obstruction.  *See table(s) above for measurements and observations. Electronically signed by Nanetta Batty MD on 04/09/2023 at 5:01:53 PM.    Final     Assessment/Plan Memory changes  Frequent falls  Chronic systolic congestive heart failure  (HCC)  Chronic kidney disease (CKD) stage G3a/A2, moderately decreased glomerular filtration rate (GFR) between 45-59 mL/min/1.73 square meter and albuminuria creatinine ratio between 30-299 mg/g [facility]) Patient scored 18/30 on SLUMS today. Abnormal clock completion - patient would benefit from additional evaluation when she has fully recovered from acute mental status changes. Continues to have low blood pressure despite changes in diuretic medications. Appears euvolemic on exam, will need to continue monitoring weight to determine need for PRN dosing of lasix or spironolactone. Unclear at this time if memory changes are due to dementia or due to a psychiatric condition which has been poorly differentiated at this time. Per PCP, recent changes have been a significant change. Plan to start abilify 2 mg nightly and see how patient's mental status may improve. F/u with PCP on Thursday. Communicated with Social work, Civil engineer, contracting, and leadership regarding evaluation from today to aid in supporting patient's current health status.    Family/ staff Communication: nursing, PCP  Labs/tests ordered:  none  I spent greater than 30 minutes for the care of this patient in face to face time, chart review, clinical documentation, patient education.

## 2023-05-07 ENCOUNTER — Telehealth: Payer: Self-pay | Admitting: Internal Medicine

## 2023-05-07 LAB — MISC LABCORP TEST (SEND OUT): Labcorp test code: 702800

## 2023-05-07 NOTE — Telephone Encounter (Signed)
Pt son would like to be called regarding the pt and the appointment tomorrow

## 2023-05-08 ENCOUNTER — Ambulatory Visit (INDEPENDENT_AMBULATORY_CARE_PROVIDER_SITE_OTHER): Payer: Medicare Other | Admitting: Internal Medicine

## 2023-05-08 ENCOUNTER — Encounter: Payer: Self-pay | Admitting: Internal Medicine

## 2023-05-08 VITALS — BP 92/72 | HR 77 | Temp 97.4°F | Ht 65.0 in | Wt 143.0 lb

## 2023-05-08 DIAGNOSIS — F419 Anxiety disorder, unspecified: Secondary | ICD-10-CM | POA: Diagnosis not present

## 2023-05-08 DIAGNOSIS — F0390 Unspecified dementia without behavioral disturbance: Secondary | ICD-10-CM

## 2023-05-08 DIAGNOSIS — F23 Brief psychotic disorder: Secondary | ICD-10-CM | POA: Diagnosis not present

## 2023-05-08 MED ORDER — BRIVARACETAM 25 MG PO TABS: 25.0000 mg | ORAL_TABLET | Freq: Two times a day (BID) | ORAL | 0 refills | Status: DC

## 2023-05-08 NOTE — Progress Notes (Signed)
Subjective:  Patient ID: [name] [name], female    DOB: 01/04/1941  Age: 82 y.o. MRN: [id number]  CC: The primary encounter diagnosis was Anxiety. 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   HPI [name] [name] presents for hospital follow up.  She is accompanied by her son, [name].    Most of today's visit was spent in conversation with [name],  as patient refused to open her eyes or speak .   [name] [name] has had at least  4 ER visits in the last month for falls secondary to weakness, confusion, and more recently  AMS /bizarre behavior. .  She has had one short admission to psychiatry floor,  but was evaluated by psychiatry and determined to be lacking in criteria and therefore inadmissable for inpatient psychiatric treatment.  During one of her ER visits she was evaluated by neurology.  EEG normal.  She was evaluated and Cleared by psychiatry on May 23  ER visit to return to [facility] but  returned  with supervision.  Has been in Respite /Care since May 29 ER discharge and has been evaluaated by  MD Earnestine Mealing .on 2 separate occasions.  Initially she was treated for UTI and abiligy was added. .  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 She has been taking this medication for over 6 months. At the televisit  on may 23 the dose was reudced to 50 mg bid.     Seen by an ER neurologist  on May 28 who told son that she may have witnessed patient had a dissociative seizure . Patient's behavior has been described as bizarre and intentional  she has been noted to maintain an unresponsive state or up to 18 hours, but  broker her silent state when she overheard that an MRI was being considered  because she was concerned that it would be deadly due to her pacemaker implantation.      Her son is understandably frustrated by her current situation .  He reports that her mother has always been concerned with being  "in control" and he  believes that most of her behavior is inentional, but is also concerned that her medications are contributing to her current state.   Patient requested to speak with me alone, during which time she requested that her shirt be removed ("I am overheated " ) and exhibited paranoid behavior and delusional thoughts.  She states that  some of the  ER records are on the wrong person.  She states that she is being subjected to a human experiment  by Dr.  Sydnee Cabal  She does not trust  Dr  Sydnee Cabal and states that she is "part of a group of people" that are "doing bad things,   using drugs  on people with no idea what they are doing."  She is also afraid of "people finding out where she has been" and trusts only her neighbor Belva Agee.    Outpatient Medications Prior to Visit  Medication Sig Dispense Refill   acetaminophen (TYLENOL) 325 MG tablet Take 650 mg by mouth every 4 (four) hours as needed.     apixaban (ELIQUIS) 5 MG TABS  tablet Take 5 mg by mouth 2 (two) times daily.     ARIPiprazole (ABILIFY) 2 MG tablet Take 2 mg by mouth daily.     calcium carbonate (CALCIUM 600) 600 MG TABS tablet 600 mg 2 (two) times daily with a meal.     Cholecalciferol (VITAMIN D) 2000 units CAPS Take 2,000 Units by mouth daily.     fluticasone (FLONASE) 50 MCG/ACT nasal spray USE 2 SPRAYS IN BOTH NOSTRILS DAILY 16 g 11   folic acid (FOLVITE) 1 MG tablet Take 1 mg by mouth daily.     furosemide (LASIX) 40 MG tablet Take 40 mg by mouth. As needed for overnight weight gain of 3lbs or 5lbs/week.     gabapentin (NEURONTIN) 100 MG capsule Take 2 capsules by mouth 3 (three) times daily.     lamoTRIgine (LAMICTAL) 150 MG tablet Take 150 mg by mouth daily.     lamoTRIgine (LAMICTAL) 200 MG  tablet Take 200 mg by mouth at bedtime.     losartan (COZAAR) 25 MG tablet Take 0.5 tablets (12.5 mg total) by mouth daily. 45 tablet 3   metaxalone (SKELAXIN) 800 MG tablet Take 800 mg by mouth 3 (three) times daily as needed for muscle spasms.     metoprolol tartrate (LOPRESSOR) 25 MG tablet Take 25 mg by mouth daily.     montelukast (SINGULAIR) 10 MG tablet TAKE ONE TABLET BY MOUTH AT BEDTIME 90 tablet 1   nitrofurantoin, macrocrystal-monohydrate, (MACROBID) 100 MG capsule Take 100 mg by mouth 2 (two) times daily.     polyethylene glycol powder (GLYCOLAX/MIRALAX) 17 GM/SCOOP powder Take 1 Dose by mouth daily.     rosuvastatin (CRESTOR) 40 MG tablet Take 1 tablet (40 mg total) by mouth daily. 90 tablet 3   saccharomyces boulardii (FLORASTOR) 250 MG capsule Take 250 mg by mouth 2 (two) times daily.     VENTOLIN HFA 108 (90 Base) MCG/ACT inhaler INHALE 2 PUFFS INTO THE LUNGS EVERY 6 HOURS AS NEEDED FOR WHEEZING OR SHORTNESS OF BREATH 18 g 1   vitamin B-12 (CYANOCOBALAMIN) 1000 MCG tablet Take 1,000 mcg by mouth daily.     BRIVIACT 50 MG TABS Take 50 mg by mouth 2 (two) times daily. 50 mg in AM, 100 mg HS     No facility-administered medications prior to visit.    Review of Systems;  Patient denies headache, fevers, malaise, unintentional weight loss, skin rash, eye pain, sinus congestion and sinus pain, sore throat, dysphagia,  hemoptysis , cough, dyspnea, wheezing, chest pain, palpitations, orthopnea, edema, abdominal pain, nausea, melena, diarrhea, constipation, flank pain, dysuria, hematuria, urinary  Frequency, nocturia, numbness, tingling,  Loss of consciousness,  Tremor, insomnia, depression, anxiety, and suicidal ideation.      Objective:  BP 92/72   Pulse 77   Temp (!) 97.4 F (36.3 C)   Ht 5\' 5"  (1.651 m)   Wt 143 lb (64.9 kg)   SpO2 96%   BMI 23.80 kg/m   BP Readings from Last 3 Encounters:  05/08/23 92/72  05/05/23 103/62  04/30/23 125/70    Wt Readings from Last 3  Encounters:  05/08/23 143 lb (64.9 kg)  05/05/23 134 lb 12.8 oz (61.1 kg)  05/02/23 144 lb (65.3 kg)    Physical Exam Vitals reviewed.  Constitutional:      General: She is not in acute distress.    Appearance: Normal appearance. She is normal weight. She is not ill-appearing, toxic-appearing or diaphoretic.  HENT:     Head:  Normocephalic.  Eyes:     General: No scleral icterus.       Right eye: No discharge.        Left eye: No discharge.     Conjunctiva/sclera: Conjunctivae normal.  Cardiovascular:     Rate and Rhythm: Normal rate and regular rhythm.     Heart sounds: Normal heart sounds.  Pulmonary:     Effort: Pulmonary effort is normal. No respiratory distress.     Breath sounds: Normal breath sounds.  Musculoskeletal:        General: Normal range of motion.  Skin:    General: Skin is warm and dry.  Neurological:     General: No focal deficit present.     Mental Status: She is alert and oriented to person, place, and time. Mental status is at baseline.  Psychiatric:        Attention and Perception: Attention normal.        Mood and Affect: Affect is flat.        Speech: Speech normal.        Behavior: Behavior is withdrawn.        Thought Content: Thought content is paranoid and delusional. Thought content does not include homicidal or suicidal ideation.    Lab Results  Component Value Date   HGBA1C 5.5 02/11/2020   HGBA1C 5.7 05/21/2019   HGBA1C 5.3 04/07/2018    Lab Results  Component Value Date   CREATININE 1.51 (H) 04/29/2023   CREATININE 1.25 (H) 04/23/2023   CREATININE 1.12 (H) 04/19/2023    Lab Results  Component Value Date   WBC 3.8 (L) 04/30/2023   HGB 8.4 (L) 04/30/2023   HCT 27.1 (L) 04/30/2023   PLT 117 (L) 04/30/2023   GLUCOSE 99 04/29/2023   CHOL 143 05/21/2019   TRIG 60.0 05/21/2019   HDL 59.50 05/21/2019   LDLDIRECT 113.0 02/15/2016   LDLCALC 71 05/21/2019   ALT 24 04/29/2023   AST 33 04/29/2023   NA 141 04/29/2023   K 3.2 (L)  04/29/2023   CL 102 04/29/2023   CREATININE 1.51 (H) 04/29/2023   BUN 29 (H) 04/29/2023   CO2 28 04/29/2023   TSH 4.842 (H) 04/29/2023   INR 1.2 06/18/2021   HGBA1C 5.5 02/11/2020   MICROALBUR 2.8 (H) 11/13/2021    DG Chest Port 1 View  Result Date: 04/29/2023 CLINICAL DATA:  [id number] with altered mental status, unresponsive at [facility] today. EXAM: PORTABLE CHEST 1 VIEW COMPARISON:  Portable chest 04/23/2023 FINDINGS: Aagain noted left chest dual lead pacing system with stable wire insertions, left atrial appendage closure device and metallic mitral valve prosthesis. Moderate to severe cardiomegaly is also again seen with the interval development of central vascular prominence and interstitial edema. There are perihilar alveolar opacities which could be due to alveolar edema, pneumonia or combination. Small pleural effusions are forming but no substantial pleural effusion at this time. The mediastinum is stable with aortic tortuosity and calcific plaques. Osteopenia and thoracic spondylosis. IMPRESSION: 1. Moderate to severe cardiomegaly with central vascular prominence and interstitial edema, consistent with CHF or fluid overload. 2. Perihilar alveolar opacities which could be due to alveolar edema, pneumonia or combination. 3. Small pleural effusions. 4. Aortic atherosclerosis and uncoiling. Electronically Signed   By: Almira Bar M.D.   On: 04/29/2023 20:17   EEG adult  Result Date: 04/29/2023 Charlsie Quest, MD     04/29/2023  5:15 PM Patient Name: ALEXIANA MORRISSETTE MRN: [id number] Epilepsy Attending: Kristopher Oppenheim  Melynda Ripple Referring Physician/Provider: Gordy Councilman, MD Date: 04/29/2023 Duration: 26.58 mins Patient history: 82yo F after an episode of unresponsiveness. EEG to evaluate for seizure Level of alertness: Awake, asleep AEDs during EEG study: None Technical aspects: This EEG study was done with scalp electrodes positioned according to the 10-20 International system of electrode  placement. Electrical activity was reviewed with band pass filter of 1-70Hz , sensitivity of 7 uV/mm, display speed of 55mm/sec with a 60Hz  notched filter applied as appropriate. EEG data were recorded continuously and digitally stored.  Video monitoring was available and reviewed as appropriate. Description: The posterior dominant rhythm consists of 8-9 Hz activity of moderate voltage (25-35 uV) seen predominantly in posterior head regions, symmetric and reactive to eye opening and eye closing. Sleep was characterized by vertex waves, sleep spindles (12 to 14 Hz), maximal frontocentral region. Hyperventilation and photic stimulation were not performed.   IMPRESSION: This study is within normal limits. No seizures or epileptiform discharges were seen throughout the recording. A normal interictal EEG does not exclude the diagnosis of epilepsy. Charlsie Quest   CT Head Wo Contrast  Result Date: 04/29/2023 CLINICAL DATA:  Delirium, found down EXAM: CT HEAD WITHOUT CONTRAST TECHNIQUE: Contiguous axial images were obtained from the base of the skull through the vertex without intravenous contrast. RADIATION DOSE REDUCTION: This exam was performed according to the departmental dose-optimization program which includes automated exposure control, adjustment of the mA and/or kV according to patient size and/or use of iterative reconstruction technique. COMPARISON:  04/23/2023 FINDINGS: Brain: Normal anatomic configuration. Parenchymal volume is relatively well preserved given the patient's age. Mild periventricular white matter changes are present likely reflecting the sequela of small vessel ischemia. No abnormal intra or extra-axial mass lesion or fluid collection. No abnormal mass effect or midline shift. No evidence of acute intracranial hemorrhage or infarct. Ventricular size is normal. Cerebellum unremarkable. Vascular: No asymmetric hyperdense vasculature at the skull base. Skull: Intact Sinuses/Orbits: Paranasal  sinuses are clear. Orbits are unremarkable. Other: Mastoid air cells and middle ear cavities are clear. IMPRESSION: 1. No acute intracranial hemorrhage or infarct. 2. Mild senescent change. Electronically Signed   By: Helyn Numbers M.D.   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  Reduce dose to 25 mg bid x 7 days,  then 25 mg daily x 7 days then stop.  Contiue abilitfy.  Psychiatry referral.  Continue supervised living situation   Orders: -     Ambulatory referral to Psychiatry  Other orders -     Brivaracetam; Take 1 tablet (25 mg total) by mouth 2 (two) times daily.  Dispense: 60 tablet; Refill: 0     I provided 60 minutes of face-to-face time during this encounter reviewing patient's last visit with me, patient's  most recent  ER visits,   visits with  neurology,  , previous  labs and imaging studies, counseling on currently addressed issues,  and post visit ordering to diagnostics and therapeutics .   Follow-up: No follow-ups on file.   Sherlene Shams, MD

## 2023-05-08 NOTE — Assessment & Plan Note (Signed)
In my opinion , this may be due to Briviact  given the published incidence of psychotic disturbances of 13% in elderly.  Reduce dose to 25 mg bid x 7 days,  then 25 mg daily x 7 days then stop.  Contiue abilitfy.  Psychiatry referral.  Continue supervised living situation

## 2023-05-08 NOTE — Patient Instructions (Addendum)
REDUCE DOSE OF BRIVIACT TO 25 MG TWICE DAILY  for one week,  then 25 mg once daily for one week,  then stop   CONTINUE ABILIFY FOR THE NEXT MONTH  STOP LOSARTAN  I recommend that Aftyn remain in a supervised setting and will look into how we can increase her supervision-

## 2023-05-08 NOTE — Telephone Encounter (Signed)
Called Patient's son and he asked if we had a wheelchair and where it is located. I let him know it is by the front door.

## 2023-05-14 ENCOUNTER — Encounter: Payer: Self-pay | Admitting: Student

## 2023-05-14 ENCOUNTER — Non-Acute Institutional Stay (SKILLED_NURSING_FACILITY): Payer: Medicare Other | Admitting: Student

## 2023-05-14 DIAGNOSIS — F0390 Unspecified dementia without behavioral disturbance: Secondary | ICD-10-CM

## 2023-05-14 NOTE — Progress Notes (Signed)
Location:  Other [facility].  Nursing Home Room Number: [facility] 210[address] Place of Service:  ALF [phone]) Provider:  Dr. Earnestine Mealing  PCP: Sherlene Shams, MD  Patient Care Team: Sherlene Shams, MD as PCP - General (Internal Medicine) Marinus Maw, MD as PCP - Electrophysiology (Cardiology) Antonieta Iba, MD as PCP - Cardiology (Cardiology) Lemar Livings Merrily Pew, MD (General Surgery) Sherlene Shams, MD (Internal Medicine) Sherlene Shams, MD (Internal Medicine)  Extended Emergency Contact Information Primary Emergency Contact: Thon,Brett Address: [address]          [place], [place] [address] [place] of [place] Mobile Phone: [phone] Relation: Son  Code Status:  Full Code Goals of care: Advanced Directive information    05/14/2023   10:12 AM  Advanced Directives  Does Patient Have a Medical Advance Directive? Yes  Type of Estate agent of [place];Living will  Does patient want to make changes to medical advance directive? No - Patient declined  Copy of Healthcare Power of Attorney in Chart? Yes - validated most recent copy scanned in chart (See row information)     Chief Complaint  Patient presents with   Acute Visit    Psychosis    HPI:  Pt is a 82 y.o. female seen today for an acute visit for Psychosis.   Patient has had worsening symptoms. She was up most of the night on the floor digging underneath her sofa looking for a "little man." This person is in fact her neighbor, however, she states, "see there in the closet is the faint image of a tunnel. Do you see the outline?"  She states, I've met you before. She is disoriented to place. She states we are at her home address.    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 Ladona Ridgel   Cataract    Left eye   Chronic kidney disease (CKD) stage G3a/A2, moderately decreased glomerular filtration rate (GFR) between 45-59 mL/min/1.73 square meter and albuminuria creatinine ratio between 30-299 mg/g (HCC) 11/13/2021   Closed bilateral fracture of pubic rami (HCC) 08/28/2022   Diverticulosis    Focal seizures (HCC)    Heart murmur    Hematuria, gross 04/09/2018   Hemorrhoid    Hepatic cyst    innumerable   History of asbestos exposure    Hyperlipidemia    IBS (irritable bowel syndrome)    Idiopathic thrombocytopenic purpura (ITP) (HCC)    Migraine    MVP (mitral valve prolapse)    Near syncope 01/26/2023   Nodule of right lung    Osteoporosis of forearm    RBBB    Restrictive lung disease    Mild on PFT & likely cardiac in etiology    Right sided sciatica 04/10/2022   S/P Minimally invasive maze operation for atrial fibrillation 10/31/2015   Complete bilateral atrial lesion set using cryothermy and bipolar radiofrequency ablation with clipping of LA appendage via right mini thoracotomy approach   S/P minimally invasive mitral valve replacement  with bioprosthetic valve 10/31/2015   33 mm [facility] Mitral bovine bioprosthetic tissue valve placed via right mini thoracotomy approach   Seizures (HCC)    left foot paralysis and left hand paralysis Dr. 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   SVT (supraventricular tachycardia)    Thoracic aorta atherosclerosis (HCC)    TIA (transient ischemic attack)    Visit for preventive health examination 05/23/2016   Past Surgical History:  Procedure Laterality Date   BREAST EXCISIONAL BIOPSY Right Late 80s   Negative X2   CARDIAC CATHETERIZATION N/A 10/18/2015    Procedure: Right/Left Heart Cath and Coronary Angiography;  Surgeon: Tonny Bollman, MD;  Location: [facility] INVASIVE CV LAB;  Service: Cardiovascular;  Laterality: N/A;   CARDIOVERSION N/A 08/22/2015   Procedure: CARDIOVERSION;  Surgeon: Vesta Mixer, MD;  Location: [facility] ENDOSCOPY;  Service: Cardiovascular;  Laterality: N/A;   COLONOSCOPY  2003   EP IMPLANTABLE DEVICE N/A 11/06/2015   Procedure: Pacemaker Implant;  Surgeon: Marinus Maw, MD;  Location: [facility] INVASIVE CV LAB;  Service: Cardiovascular;  Laterality: N/A;   EP IMPLANTABLE DEVICE N/A 02/20/2016   Procedure: Lead Extraction;  Surgeon: Marinus Maw, MD;  Location: [facility] INVASIVE CV LAB;  Service: Cardiovascular;  Laterality: N/A;   EYE SURGERY Right 2013   FEMUR IM NAIL Left 06/18/2021   Procedure: INTRAMEDULLARY (IM) NAIL FEMORAL, OPEN REDUCTION INTERNAL FIXATION FEMUR RIGHT LITTLE FINGER PIP CLOSED REDUCTION;  Surgeon: Myrene Galas, MD;  Location: [facility] OR;  Service: Orthopedics;  Laterality: Left;   FOOT SURGERY     ~2007 right foot bunion   MANDIBLE FRACTURE SURGERY  03/26/2013   MINIMALLY INVASIVE MAZE PROCEDURE N/A 10/31/2015   Procedure: MINIMALLY INVASIVE MAZE PROCEDURE;  Surgeon: Purcell Nails, MD;  Location: [facility] OR;  Service: Open Heart Surgery;  Laterality: N/A;   MITRAL VALVE REPLACEMENT Right 10/31/2015   Procedure: MINIMALLY INVASIVE MITRAL VALVE (MV) REPLACEMENT;  Surgeon: Purcell Nails, MD;  Location: [facility] OR;  Service: Open Heart Surgery;  Laterality: Right;   PACEMAKER LEAD REMOVAL  02/20/2016   TEE WITH CARDIOVERSION     TEE WITHOUT CARDIOVERSION N/A 08/22/2015   Procedure: TRANSESOPHAGEAL ECHOCARDIOGRAM (TEE);  Surgeon: Vesta Mixer, MD;  Location: [facility] ENDOSCOPY;  Service: Cardiovascular;  Laterality: N/A;   TEE WITHOUT CARDIOVERSION N/A 10/31/2015   Procedure: TRANSESOPHAGEAL ECHOCARDIOGRAM (TEE);  Surgeon: Purcell Nails, MD;  Location: [facility] OR;  Service: Open Heart Surgery;  Laterality: N/A;   TUBAL LIGATION      VARICOSE VEIN SURGERY Right     Allergies  Allergen Reactions   Codeine Nausea And Vomiting and Other (See Comments)    migraine   Epinephrine Palpitations and Shortness Of Breath   Hydrocodone Nausea And Vomiting and Other (See Comments)    MIGRAINE   Hydromorphone Nausea And Vomiting and Other (See Comments)    migraine   Molds & Smuts Anxiety, Other (See Comments) and Shortness Of Breath   Oxycodone Nausea And Vomiting and Other (See Comments)    Severe migraine   Meloxicam Other (See Comments)    Severe reflux   Codeine Other (See Comments)    Migraine   Dextromethorphan Other (See Comments)    seizures   Diphen [Diphenhydramine Hcl] Other (See Comments)    seizure   Diphenhydramine Other (See Comments)    seizure   Diphenhydramine Hcl     Other reaction(s): Other (See Comments)  Diphenylpyraline Other (See Comments)   Doxycycline Itching, Swelling and Other (See Comments)    Facial   Doxycycline Itching and Swelling   Hydrocodone Other (See Comments)    Migraine   Hydromorphone Other (See Comments)    Migraine   Meloxicam Other (See Comments)   Mobic [Meloxicam] Other (See Comments)    reflux   Nsaids    Nsaids Other (See Comments)    Pt on blood thinner   Oxycodone Other (See Comments)    Migraine   Propofol Other (See Comments)   Propofol Other (See Comments)    Very sensitive; patient stated she was told she was told she had apnea    Outpatient Encounter Medications as of 05/14/2023  Medication Sig   acetaminophen (TYLENOL) 325 MG tablet Take 650 mg by mouth every 4 (four) hours as needed.   apixaban (ELIQUIS) 5 MG TABS tablet Take 5 mg by mouth 2 (two) times daily.   ARIPiprazole (ABILIFY) 10 MG tablet Take 10 mg by mouth daily.   ARIPiprazole (ABILIFY) 5 MG tablet Take 5 mg by mouth daily. For 2 days   brivaracetam (BRIVIACT) 25 MG TABS tablet Take 1 tablet (25 mg total) by mouth 2 (two) times daily. (Patient taking differently: Take 25 mg by mouth  daily. In the morning for 1 week then D/C)   furosemide (LASIX) 40 MG tablet Take 40 mg by mouth. As needed for overnight weight gain of 3lbs or 5lbs/week.   gabapentin (NEURONTIN) 100 MG capsule Take 2 capsules by mouth 3 (three) times daily.   lamoTRIgine (LAMICTAL) 150 MG tablet Take 150 mg by mouth daily.   lamoTRIgine (LAMICTAL) 200 MG tablet Take 200 mg by mouth at bedtime.   metaxalone (SKELAXIN) 800 MG tablet Take 800 mg by mouth 3 (three) times daily as needed for muscle spasms.   metoprolol tartrate (LOPRESSOR) 25 MG tablet Take 25 mg by mouth daily.   montelukast (SINGULAIR) 10 MG tablet TAKE ONE TABLET BY MOUTH AT BEDTIME   polyethylene glycol powder (GLYCOLAX/MIRALAX) 17 GM/SCOOP powder Take 1 Dose by mouth daily.   rosuvastatin (CRESTOR) 40 MG tablet Take 1 tablet (40 mg total) by mouth daily.   VENTOLIN HFA 108 (90 Base) MCG/ACT inhaler INHALE 2 PUFFS INTO THE LUNGS EVERY 6 HOURS AS NEEDED FOR WHEEZING OR SHORTNESS OF BREATH   [DISCONTINUED] ARIPiprazole (ABILIFY) 2 MG tablet Take 2 mg by mouth daily.   [DISCONTINUED] calcium carbonate (CALCIUM 600) 600 MG TABS tablet 600 mg 2 (two) times daily with a meal.   [DISCONTINUED] Cholecalciferol (VITAMIN D) 2000 units CAPS Take 2,000 Units by mouth daily.   [DISCONTINUED] fluticasone (FLONASE) 50 MCG/ACT nasal spray USE 2 SPRAYS IN BOTH NOSTRILS DAILY   [DISCONTINUED] folic acid (FOLVITE) 1 MG tablet Take 1 mg by mouth daily.   [DISCONTINUED] losartan (COZAAR) 25 MG tablet Take 0.5 tablets (12.5 mg total) by mouth daily.   [DISCONTINUED] nitrofurantoin, macrocrystal-monohydrate, (MACROBID) 100 MG capsule Take 100 mg by mouth 2 (two) times daily.   [DISCONTINUED] saccharomyces boulardii (FLORASTOR) 250 MG capsule Take 250 mg by mouth 2 (two) times daily.   [DISCONTINUED] vitamin B-12 (CYANOCOBALAMIN) 1000 MCG tablet Take 1,000 mcg by mouth daily.   No facility-administered encounter medications on file as of 05/14/2023.    Review of  Systems  Immunization History  Administered Date(s) Administered   Fluad Quad(high Dose 65+) 08/06/2019, 08/25/2020, 08/23/2021, 08/28/2022   Hep A / Hep B 05/09/2016, 06/11/2016, 11/12/2016   Influenza Split 10/10/2013  Influenza Whole 10/10/2011   Influenza, High Dose Seasonal PF 08/19/2016, 09/05/2017   Influenza,inj,Quad PF,6+ Mos 08/01/2014, 08/04/2015   Influenza-Unspecified 09/05/2017, 09/17/2018, 09/21/2019   Moderna Covid-19 Vaccine Bivalent Booster 51yrs & up 08/31/2021   Moderna Sars-Covid-2 Vaccination 12/16/2019, 01/13/2020, 10/17/2020, 03/16/2021   Pneumococcal Conjugate-13 01/10/2014   Pneumococcal Polysaccharide-23 06/25/2012   Tdap 06/25/2012   Zoster Recombinat (Shingrix) 04/24/2017, 08/26/2017   Zoster, Live 01/10/2006   Pertinent  Health Maintenance Due  Topic Date Due   INFLUENZA VACCINE  07/03/2023   DEXA SCAN  Completed      06/26/2022   11:37 AM 08/28/2022    4:24 PM 10/03/2022    1:56 PM 02/03/2023   11:01 AM 05/08/2023   12:37 PM  Fall Risk  Falls in the past year? 1 1  1 1   Was there an injury with Fall? 1 1  1 1   Fall Risk Category Calculator 2 2  3 3   Fall Risk Category (Retired) Moderate Moderate     (RETIRED) Patient Fall Risk Level High fall risk High fall risk High fall risk    Patient at Risk for Falls Due to History of fall(s) History of fall(s);Impaired balance/gait  History of fall(s) History of fall(s)  Fall risk Follow up Falls evaluation completed Falls evaluation completed  Falls evaluation completed Falls evaluation completed   Functional Status Survey:    Vitals:   05/14/23 1003  BP: 101/63  Pulse: 72  Resp: 18  Temp: (!) 95.1 F (35.1 C)  SpO2: 93%  Weight: 137 lb (62.1 kg)  Height: 5\' 5"  (1.651 m)   Body mass index is 22.8 kg/m. Physical Exam Cardiovascular:     Rate and Rhythm: Normal rate.  Pulmonary:     Effort: Pulmonary effort is normal.     Breath sounds: Normal breath sounds.  Abdominal:     General: Abdomen  is flat.  Neurological:     Mental Status: She is alert.  Psychiatric:     Comments: Disheveled, abnormal behavior thought content and judgement.      Labs reviewed: Recent Labs    04/19/23 1331 04/23/23 0127 04/29/23 1500  NA 137 136 141  K 3.7 4.2 3.2*  CL 101 100 102  CO2 27 28 28   GLUCOSE 98 113* 99  BUN 26* 27* 29*  CREATININE 1.12* 1.25* 1.51*  CALCIUM 9.4 9.0 9.4  MG  --   --  2.1   Recent Labs    04/17/23 0529 04/23/23 0127 04/29/23 1501  AST 40 50* 33  ALT 23 27 24   ALKPHOS 97 111 109  BILITOT 0.8 1.0 0.9  PROT 6.3* 6.5 6.1*  ALBUMIN 4.1 4.2 3.7   Recent Labs    04/03/23 1335 04/17/23 0529 04/19/23 1331 04/23/23 0127 04/29/23 1500 04/30/23 0534  WBC 3.8* 3.4*   < > 4.3 4.0 3.8*  NEUTROABS 2.6 2.2  --  3.1  --   --   HGB 8.9* 9.0*   < > 9.8* 9.0* 8.4*  HCT 28.0* 28.6*   < > 31.2* 29.1* 27.1*  MCV 93.0 95.0   < > 95.1 94.5 95.1  PLT 111* 91*   < > 107* 125* 117*   < > = values in this interval not displayed.   Lab Results  Component Value Date   TSH 4.842 (H) 04/29/2023   Lab Results  Component Value Date   HGBA1C 5.5 02/11/2020   Lab Results  Component Value Date   CHOL 143 05/21/2019  HDL 59.50 05/21/2019   LDLCALC 71 05/21/2019   LDLDIRECT 113.0 02/15/2016   TRIG 60.0 05/21/2019   CHOLHDL 2 05/21/2019    Significant Diagnostic Results in last 30 days:  DG Chest Port 1 View  Result Date: 04/29/2023 CLINICAL DATA:  [id number] with altered mental status, unresponsive at [facility] today. EXAM: PORTABLE CHEST 1 VIEW COMPARISON:  Portable chest 04/23/2023 FINDINGS: Aagain noted left chest dual lead pacing system with stable wire insertions, left atrial appendage closure device and metallic mitral valve prosthesis. Moderate to severe cardiomegaly is also again seen with the interval development of central vascular prominence and interstitial edema. There are perihilar alveolar opacities which could be due to alveolar edema, pneumonia or  combination. Small pleural effusions are forming but no substantial pleural effusion at this time. The mediastinum is stable with aortic tortuosity and calcific plaques. Osteopenia and thoracic spondylosis. IMPRESSION: 1. Moderate to severe cardiomegaly with central vascular prominence and interstitial edema, consistent with CHF or fluid overload. 2. Perihilar alveolar opacities which could be due to alveolar edema, pneumonia or combination. 3. Small pleural effusions. 4. Aortic atherosclerosis and uncoiling. Electronically Signed   By: Almira Bar M.D.   On: 04/29/2023 20:17   EEG adult  Result Date: 04/29/2023 Charlsie Quest, MD     04/29/2023  5:15 PM Patient Name: DZARIA SCHECKEL MRN: [id number] Epilepsy Attending: Charlsie Quest Referring Physician/Provider: Gordy Councilman, MD Date: 04/29/2023 Duration: 26.58 mins Patient history: 82yo F after an episode of unresponsiveness. EEG to evaluate for seizure Level of alertness: Awake, asleep AEDs during EEG study: None Technical aspects: This EEG study was done with scalp electrodes positioned according to the 10-20 International system of electrode placement. Electrical activity was reviewed with band pass filter of 1-70Hz , sensitivity of 7 uV/mm, display speed of 67mm/sec with a 60Hz  notched filter applied as appropriate. EEG data were recorded continuously and digitally stored.  Video monitoring was available and reviewed as appropriate. Description: The posterior dominant rhythm consists of 8-9 Hz activity of moderate voltage (25-35 uV) seen predominantly in posterior head regions, symmetric and reactive to eye opening and eye closing. Sleep was characterized by vertex waves, sleep spindles (12 to 14 Hz), maximal frontocentral region. Hyperventilation and photic stimulation were not performed.   IMPRESSION: This study is within normal limits. No seizures or epileptiform discharges were seen throughout the recording. A normal interictal EEG does not  exclude the diagnosis of epilepsy. Charlsie Quest   CT Head Wo Contrast  Result Date: 04/29/2023 CLINICAL DATA:  Delirium, found down EXAM: CT HEAD WITHOUT CONTRAST TECHNIQUE: Contiguous axial images were obtained from the base of the skull through the vertex without intravenous contrast. RADIATION DOSE REDUCTION: This exam was performed according to the departmental dose-optimization program which includes automated exposure control, adjustment of the mA and/or kV according to patient size and/or use of iterative reconstruction technique. COMPARISON:  04/23/2023 FINDINGS: Brain: Normal anatomic configuration. Parenchymal volume is relatively well preserved given the patient's age. Mild periventricular white matter changes are present likely reflecting the sequela of small vessel ischemia. No abnormal intra or extra-axial mass lesion or fluid collection. No abnormal mass effect or midline shift. No evidence of acute intracranial hemorrhage or infarct. Ventricular size is normal. Cerebellum unremarkable. Vascular: No asymmetric hyperdense vasculature at the skull base. Skull: Intact Sinuses/Orbits: Paranasal sinuses are clear. Orbits are unremarkable. Other: Mastoid air cells and middle ear cavities are clear. IMPRESSION: 1. No acute intracranial hemorrhage or infarct. 2. Mild senescent  change. Electronically Signed   By: Helyn Numbers M.D.   On: 04/29/2023 15:35   CT ABDOMEN PELVIS W CONTRAST  Result Date: 04/23/2023 CLINICAL DATA:  Nausea and vomiting.  Diffuse abdominal pain EXAM: CT ABDOMEN AND PELVIS WITH CONTRAST TECHNIQUE: Multidetector CT imaging of the abdomen and pelvis was performed using the standard protocol following bolus administration of intravenous contrast. RADIATION DOSE REDUCTION: This exam was performed according to the departmental dose-optimization program which includes automated exposure control, adjustment of the mA and/or kV according to patient size and/or use of iterative  reconstruction technique. CONTRAST:  75mL OMNIPAQUE IOHEXOL 300 MG/ML  SOLN COMPARISON:  01/26/2023 FINDINGS: Lower chest: Cardiomegaly with right ventricular pacer lead and mitral valve replacement, partially covered. No acute finding in the lower chest Hepatobiliary: Innumerable liver cysts accentuated around the gallbladder fossa. No evidence of biliary calcification or obstruction. Pancreas: Unremarkable. Spleen: Unremarkable. Adrenals/Urinary Tract: Negative adrenals. No hydronephrosis or stone. 3 cm right renal cyst. No follow-up imaging is recommended given simple appearance. Unremarkable bladder. Stomach/Bowel: No obstruction. Generalized stool desiccation. No evidence of bowel inflammation. Vascular/Lymphatic: No acute vascular abnormality. Scattered atheromatous calcification. No mass or adenopathy. Reproductive:No pathologic findings. Other: No ascites or pneumoperitoneum. Shallow right groin hernia containing trace ascitic fluid. Musculoskeletal: Remote right lower rib fractures. Chronic sacral insufficiency fractures with bilateral and midline sclerosis. Chronic right sacral ala and right pubic body fractures with incomplete bony bridging at the ala. Postoperative proximal left femur. Lumbar spine degeneration is severe with pronounced dextroscoliosis. IMPRESSION: 1. No acute finding. 2. Numerous chronic findings are listed above. Electronically Signed   By: Tiburcio Pea M.D.   On: 04/23/2023 05:44   DG Chest Portable 1 View  Result Date: 04/23/2023 CLINICAL DATA:  Altered, bizarre behavior EXAM: PORTABLE CHEST 1 VIEW COMPARISON:  Chest radiograph 03/18/2023 FINDINGS: Stable cardiomegaly. Left atrial appendage occlusion device. AVR. Left chest wall pacemaker. Chronic interstitial coarsening. Chronic scar right mid lung. No pleural effusion or pneumothorax. No displaced rib fractures. IMPRESSION: No active disease. Cardiomegaly and chronic interstitial coarsening. Electronically Signed   By: Minerva Fester M.D.   On: 04/23/2023 02:02   CT HEAD WO CONTRAST ( )  Result Date: 04/23/2023 CLINICAL DATA:  Bizarre behavior on Eliquis. EXAM: CT HEAD WITHOUT CONTRAST TECHNIQUE: Contiguous axial images were obtained from the base of the skull through the vertex without intravenous contrast. RADIATION DOSE REDUCTION: This exam was performed according to the departmental dose-optimization program which includes automated exposure control, adjustment of the mA and/or kV according to patient size and/or use of iterative reconstruction technique. COMPARISON:  CT 04/19/2023 FINDINGS: Brain: No intracranial hemorrhage, mass effect, or evidence of acute infarct. No hydrocephalus. No extra-axial fluid collection. Mild generalized cerebral atrophy. Vascular: No hyperdense vessel. Intracranial arterial calcification. Skull: No fracture or focal lesion. Sinuses/Orbits: No acute finding. Frothy mucous in the left sphenoid sinus. Paranasal sinuses and mastoid air cells are otherwise well aerated. Other: None. IMPRESSION: 1. No acute intracranial abnormality. Electronically Signed   By: Minerva Fester M.D.   On: 04/23/2023 01:46   CT ANGIO HEAD NECK W WO CM  Result Date: 04/19/2023 CLINICAL DATA:  No deficit, acute, stroke suspected. Sudden onset of weakness the neck down. Unsteady gait. EXAM: CT ANGIOGRAPHY HEAD AND NECK WITH AND WITHOUT CONTRAST TECHNIQUE: Multidetector CT imaging of the head and neck was performed using the standard protocol during bolus administration of intravenous contrast. Multiplanar CT image reconstructions and MIPs were obtained to evaluate the vascular anatomy. Carotid stenosis measurements (when  applicable) are obtained utilizing NASCET criteria, using the distal internal carotid diameter as the denominator. RADIATION DOSE REDUCTION: This exam was performed according to the departmental dose-optimization program which includes automated exposure control, adjustment of the mA and/or kV  according to patient size and/or use of iterative reconstruction technique. CONTRAST:  75mL OMNIPAQUE IOHEXOL 350 MG/ML SOLN COMPARISON:  CT head without contrast/16/24 FINDINGS: CT HEAD FINDINGS Brain: No acute infarct, hemorrhage, or mass lesion is present. No significant white matter lesions are present. Deep brain nuclei are within normal limits. The ventricles are of normal size. No significant extraaxial fluid collection is present. The brainstem and cerebellum are within normal limits. Midline structures are within normal limits. Vascular: Atherosclerotic calcifications are present within the cavernous internal carotid arteries bilaterally. No hyperdense vessel is present. Skull: Calvarium is intact. No focal lytic or blastic lesions are present. No significant extracranial soft tissue lesion is present. Sinuses/Orbits: The paranasal sinuses and mastoid air cells are clear. Bilateral lens replacements are noted. Globes and orbits are otherwise unremarkable. Other: Review of the MIP images confirms the above findings CTA NECK FINDINGS Aortic arch: A 3 vessel arch configuration is present. Atherosclerotic calcifications are present at the origin of the left subclavian artery without significant stenosis. Origins of the innominate artery and left common carotid artery are normal. Right carotid system: Right common carotid artery is within normal limits. Minimal calcifications present bifurcation. No significant stenosis is present. The cervical right ICA is otherwise normal. Left carotid system: The left common carotid artery is within normal limits. Atherosclerotic calcifications are present at the carotid bifurcation. Mild tortuosity is present the cervical left ICA without significant stenosis. Vertebral arteries: The right vertebral artery is slightly dominant to the left. Both vertebral arteries originate from the subclavian arteries without significant stenosis. No significant stenosis is present in either  vertebral artery in the neck. Skeleton: The vertebral body heights and alignment are normal. Asymmetric right-sided facet degenerative changes are present in the upper cervical spine. Mandible ORIF present. No focal osseous lesions are present. Other neck: Soft tissues the neck are otherwise unremarkable. Salivary glands are within normal limits. Thyroid is normal. No significant adenopathy is present. No focal mucosal or submucosal lesions are present. Upper chest: Right greater than left pleural effusions are present. Patchy ground-glass attenuation is present at both lungs. Review of the MIP images confirms the above findings CTA HEAD FINDINGS Anterior circulation: Atherosclerotic calcifications are present within the cavernous internal carotid arteries bilaterally without significant stenosis through the ICA termini. The A1 and M1 segments are normal. The anterior communicating artery is patent. MCA bifurcations are within normal limits. The ACA and MCA branch vessels are normal. Aneurysm is present. Posterior circulation: The right vertebral artery is dominant. The PICA origins are visualized and normal. The vertebrobasilar junction and basilar artery is normal. The superior cerebellar arteries are within normal limits bilaterally posterior cerebral arteries originate from the basilar tip. The PCA branch vessels. Venous sinuses: The dural sinuses are patent. The straight sinus and deep cerebral veins are intact. Cortical veins are within normal limits. No significant vascular malformation is evident. Anatomic variants: None Review of the MIP images confirms the above findings IMPRESSION: 1. No acute or focal lesion to explain the patient's symptoms. 2. Atherosclerotic changes at the carotid bifurcations and cavernous internal carotid arteries bilaterally without significant stenosis. 3. Normal variant CTA Circle of Willis without significant proximal stenosis, aneurysm, or branch vessel occlusion. 4. Right  greater than left pleural effusions. 5. Patchy ground-glass  attenuation at both lungs. This is nonspecific, but can be seen in the setting of edema or infection. 6. Asymmetric right-sided facet degenerative changes in the upper cervical spine. Electronically Signed   By: Marin Roberts M.D.   On: 04/19/2023 15:52   CT Cervical Spine Wo Contrast  Result Date: 04/17/2023 CLINICAL DATA:  82 year old female status post fall at 2300 hours. Struck head. EXAM: CT CERVICAL SPINE WITHOUT CONTRAST TECHNIQUE: Multidetector CT imaging of the cervical spine was performed without intravenous contrast. Multiplanar CT image reconstructions were also generated. RADIATION DOSE REDUCTION: This exam was performed according to the departmental dose-optimization program which includes automated exposure control, adjustment of the mA and/or kV according to patient size and/or use of iterative reconstruction technique. COMPARISON:  Head CT today.  Cervical spine CT 03/26/2013. FINDINGS: Alignment: Loss of some cervical lordosis since 2014. Chronic mild dextroconvex cervical and more pronounced levoconvex upper thoracic scoliosis. Cervicothoracic junction alignment is within normal limits. Bilateral posterior element alignment is within normal limits. Skull base and vertebrae: Chronic TMJ degeneration. Visualized skull base is intact. No atlanto-occipital dissociation. C1 and C2 appear intact and aligned. No acute osseous abnormality identified. Soft tissues and spinal canal: No prevertebral fluid or swelling. No visible canal hematoma. Largely negative for age noncontrast neck soft tissues, left greater than right carotid bifurcation calcified atherosclerosis. Disc levels: Cervical spine degeneration is mild for age. Capacious spinal canal. 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 Levoconvex upper thoracic scoliosis apical pulmonary septal thickening does not appear significantly changed from 2014.  IMPRESSION: 1. No acute traumatic injury identified in the cervical spine. 2. Chronic scoliosis but mild for age cervical spine degeneration. Electronically Signed   By: Odessa Fleming M.D.   On: 04/17/2023 06:05   CT PELVIS WO CONTRAST  Result Date: 04/17/2023 CLINICAL DATA:  82 year old female with hip trauma. Suspected fracture. EXAM: CT PELVIS WITHOUT CONTRAST TECHNIQUE: Multidetector CT imaging of the pelvis was performed following the standard protocol without intravenous contrast. RADIATION DOSE REDUCTION: This exam was performed according to the departmental dose-optimization program which includes automated exposure control, adjustment of the mA and/or kV according to patient size and/or use of iterative reconstruction technique. COMPARISON:  CT of the abdomen and pelvis 01/26/2023. FINDINGS: Urinary Tract:  No abnormality visualized. Bowel:  Unremarkable visualized pelvic bowel loops. 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 No definite lymphadenopathy noted in the pelvis. Reproductive:  Uterus and ovaries are atrophic. Other: No significant volume of ascites and no pneumoperitoneum noted in the visualized portions of the peritoneal cavity. Musculoskeletal: Postoperative changes of ORIF are again noted in the left proximal femur, with no periprosthetic fracture or immediate complicating features. No acute displaced fracture of the bony pelvis is identified. However, there are multiple chronic healing fractures of the bony pelvis including a right parasymphyseal fracture, mildly displaced fracture of the right inferior pubic ramus, nondisplaced fracture of the right superior pubic ramus, and nondisplaced fractures of the sacral ala bilaterally. Areas of increasing sclerosis and callus formation are noted in all of these regions when compared to the  recent prior study. IMPRESSION: 1. No acute abnormality of the bony pelvis. 2. Multiple pelvic fractures redemonstrated, as detailed above, similar to the prior study showing some interval healing compared to the prior examination. 3. Status post ORIF in the left proximal femur with no periprosthetic fracture or other acute complicating  features. 4. Aortic atherosclerosis. Electronically Signed   By: Trudie Reed M.D.   On: 04/17/2023 06:05   CT HEAD WO CONTRAST ( )  Result Date: 04/17/2023 CLINICAL DATA:  82 year old female status post fall at 2300 hours. Struck head. EXAM: CT HEAD WITHOUT CONTRAST TECHNIQUE: Contiguous axial images were obtained from the base of the skull through the vertex without intravenous contrast. RADIATION DOSE REDUCTION: This exam was performed according to the departmental dose-optimization program which includes automated exposure control, adjustment of the mA and/or kV according to patient size and/or use of iterative reconstruction technique. COMPARISON:  Head CT 01/26/2023. FINDINGS: Brain: No midline shift, ventriculomegaly, mass effect, evidence of mass lesion, intracranial hemorrhage or evidence of cortically based acute infarction. Cerebral volume, gray-white differentiation appears stable and within normal limits for age. Vascular: Calcified atherosclerosis at the skull base. No suspicious intracranial vascular hyperdensity. Skull: Stable.  No acute osseous abnormality identified. Sinuses/Orbits: Visualized paranasal sinuses and mastoids are stable and well aerated. Other: No discrete orbit or scalp soft tissue injury identified. IMPRESSION: No acute traumatic injury identified. Stable and normal for age noncontrast CT appearance of the brain. Electronically Signed   By: Odessa Fleming M.D.   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 Plan to increase abilify to 5 mg daily for 2 days then 10  mg daily. Follow up on Friday to see if there are improvements.   Family/ staff Communication: nursing  Labs/tests ordered:  none   I spent greater than 30 minutes for the care of this patient in face to face time, chart review, clinical documentation, patient education.

## 2023-05-16 ENCOUNTER — Non-Acute Institutional Stay: Payer: Medicare Other | Admitting: Student

## 2023-05-16 ENCOUNTER — Encounter: Payer: Self-pay | Admitting: Student

## 2023-05-16 DIAGNOSIS — R413 Other amnesia: Secondary | ICD-10-CM

## 2023-05-16 DIAGNOSIS — I959 Hypotension, unspecified: Secondary | ICD-10-CM | POA: Diagnosis not present

## 2023-05-16 DIAGNOSIS — R296 Repeated falls: Secondary | ICD-10-CM | POA: Diagnosis not present

## 2023-05-16 DIAGNOSIS — F0390 Unspecified dementia without behavioral disturbance: Secondary | ICD-10-CM | POA: Diagnosis not present

## 2023-05-16 NOTE — Progress Notes (Unsigned)
Location:  Other [facility]) Nursing Home Room Number: 210 B Place of Service:  ALF [phone]) Provider:  Yolanda Bonine, MD  Patient Care Team: Sherlene Shams, MD as PCP - General (Internal Medicine) Marinus Maw, MD as PCP - Electrophysiology (Cardiology) Antonieta Iba, MD as PCP - Cardiology (Cardiology) Lemar Livings Merrily Pew, MD (General Surgery) Sherlene Shams, MD (Internal Medicine) Sherlene Shams, MD (Internal Medicine) Toney Reil, MD as Consulting Physician (Gastroenterology)  Extended Emergency Contact Information Primary Emergency Contact: Cothran,Brett Address: [address]          [place], [place] [address] [place] Phone: [phone] Relation: Son  Code Status:  DNR Goals of care: Advanced Directive information    05/17/2023    9:13 PM  Advanced Directives  Does Patient Have a Medical Advance Directive? Yes  Type of Estate agent of [place];Living will  Does patient want to make changes to medical advance directive? No - Patient declined     Chief Complaint  Patient presents with   Acute Visit    Goals of care     HPI:  Pt is a 82 y.o. female seen today for an acute visit for plan of care discussion with patient's daughter and son.   Patient is smiling and wheeling around in her wheelchair. She says, "I've met you once before." But continues to hear voices.   Per discussion with [name] and Natalia Leatherwood:  Discussed concern that patient has had longstanding issues with OCD and being "on edge." She has not shown clear signs of dementia until around 6 months ago. Home less organized and having more trouble with this. She started having more and more falls. She has had multiple fractures. She has been very secretive of her healthcare which is new. She was having multiple falls and fractures. She was having hallucinations in the ED about her granddaughter being present when she wasn't. Primary  concern is that her secrecy is to maintain independence on campus. She has cognitive impairment and the goal would be to determine what is driving this drastic change in their mother.  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 Decreasing the seizure medication per PCP efforts. Question regarding next steps and if patient can be admitted to memory care for long term given her current state and uncertain how much is reversible/recoverable. Interested in further work up or imaging and potential psychiatric evaluation and continued support by psychologists for counseling.   Present social work [place] and [place] as well as DON at [employer] care.   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 Ladona Ridgel   Cataract    Left eye   Chronic kidney disease (CKD) stage G3a/A2, moderately decreased glomerular filtration rate (GFR) between 45-59 mL/min/1.73 square meter and albuminuria creatinine ratio between 30-299 mg/g (HCC) 11/13/2021   Closed bilateral fracture of pubic rami (HCC) 08/28/2022   Diverticulosis    Focal seizures (HCC)    Heart murmur  Hematuria, gross 04/09/2018   Hemorrhoid    Hepatic cyst    innumerable   History of asbestos exposure    Hyperlipidemia    IBS (irritable bowel syndrome)    Idiopathic thrombocytopenic purpura (ITP) (HCC)    Migraine    MVP (mitral valve prolapse)    Near syncope 01/26/2023   Nodule of right lung    Osteoporosis of forearm    RBBB    Restrictive lung disease    Mild on PFT & likely cardiac in etiology    Right sided sciatica 04/10/2022   S/P  Minimally invasive maze operation for atrial fibrillation 10/31/2015   Complete bilateral atrial lesion set using cryothermy and bipolar radiofrequency ablation with clipping of LA appendage via right mini thoracotomy approach   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   SVT (supraventricular tachycardia)    Thoracic aorta atherosclerosis (HCC)    TIA (transient ischemic attack)    Visit for preventive health examination 05/23/2016   Past Surgical History:  Procedure Laterality Date   BREAST EXCISIONAL BIOPSY Right Late 80s   Negative X2   CARDIAC CATHETERIZATION N/A 10/18/2015   Procedure: Right/Left Heart Cath and Coronary Angiography;  Surgeon: Tonny Bollman, MD;  Location: [facility] INVASIVE CV LAB;  Service: Cardiovascular;  Laterality: N/A;   CARDIOVERSION N/A 08/22/2015   Procedure: CARDIOVERSION;  Surgeon: Vesta Mixer, MD;  Location: [facility] ENDOSCOPY;  Service: Cardiovascular;  Laterality: N/A;   COLONOSCOPY  2003   EP IMPLANTABLE DEVICE N/A 11/06/2015   Procedure: Pacemaker Implant;  Surgeon: Marinus Maw, MD;  Location: [facility] INVASIVE CV LAB;  Service: Cardiovascular;  Laterality: N/A;   EP IMPLANTABLE DEVICE N/A 02/20/2016   Procedure: Lead Extraction;  Surgeon: Marinus Maw, MD;  Location: [facility] INVASIVE CV LAB;  Service: Cardiovascular;  Laterality: N/A;   EYE SURGERY Right 2013   FEMUR IM NAIL Left 06/18/2021   Procedure: INTRAMEDULLARY (IM) NAIL FEMORAL, OPEN REDUCTION INTERNAL FIXATION FEMUR RIGHT LITTLE FINGER PIP CLOSED REDUCTION;  Surgeon: Myrene Galas, MD;  Location: [facility] OR;  Service: Orthopedics;  Laterality: Left;   FOOT SURGERY     ~2007 right foot bunion   MANDIBLE  FRACTURE SURGERY  03/26/2013   MINIMALLY INVASIVE MAZE PROCEDURE N/A 10/31/2015   Procedure: MINIMALLY INVASIVE MAZE PROCEDURE;  Surgeon: Purcell Nails, MD;  Location: [facility] OR;  Service: Open Heart Surgery;  Laterality: N/A;   MITRAL VALVE REPLACEMENT Right 10/31/2015   Procedure: MINIMALLY INVASIVE MITRAL VALVE (MV) REPLACEMENT;  Surgeon: Purcell Nails, MD;  Location: [facility] OR;  Service: Open Heart Surgery;  Laterality: Right;   PACEMAKER LEAD REMOVAL  02/20/2016   TEE WITH CARDIOVERSION     TEE WITHOUT CARDIOVERSION N/A 08/22/2015   Procedure: TRANSESOPHAGEAL ECHOCARDIOGRAM (TEE);  Surgeon: Vesta Mixer, MD;  Location: [facility] ENDOSCOPY;  Service: Cardiovascular;  Laterality: N/A;   TEE WITHOUT CARDIOVERSION N/A 10/31/2015   Procedure: TRANSESOPHAGEAL ECHOCARDIOGRAM (TEE);  Surgeon: Purcell Nails, MD;  Location: [facility] OR;  Service: Open Heart Surgery;  Laterality: N/A;   TUBAL LIGATION     VARICOSE VEIN SURGERY Right     Allergies  Allergen Reactions   Codeine Nausea And Vomiting and  Other (See Comments)    migraine   Epinephrine Palpitations and Shortness Of Breath   Hydrocodone Nausea And Vomiting and Other (See Comments)    MIGRAINE   Hydromorphone Nausea And Vomiting and Other (See Comments)    migraine   Molds & Smuts Anxiety, Other (See Comments) and Shortness Of Breath   Oxycodone Nausea And Vomiting and Other (See Comments)    Severe migraine   Meloxicam Other (See Comments)    Severe reflux   Codeine Other (See Comments)    Migraine   Dextromethorphan Other (See Comments)    seizures   Diphen [Diphenhydramine Hcl] Other (See Comments)    seizure   Diphenhydramine Other (See Comments)    seizure   Diphenhydramine Hcl     Other reaction(s): Other (See Comments)   Diphenylpyraline Other (See Comments)   Doxycycline Itching, Swelling and Other (See Comments)    Facial   Doxycycline Itching and Swelling   Hydrocodone Other (See Comments)    Migraine   Hydromorphone  Other (See Comments)    Migraine   Meloxicam Other (See Comments)   Mobic [Meloxicam] Other (See Comments)    reflux   Nsaids    Nsaids Other (See Comments)    Pt on blood thinner   Oxycodone Other (See Comments)    Migraine   Propofol Other (See Comments)   Propofol Other (See Comments)    Very sensitive; patient stated she was told she was told she had apnea    No facility-administered encounter medications on file as of 05/16/2023.   Outpatient Encounter Medications as of 05/16/2023  Medication Sig   acetaminophen (TYLENOL) 325 MG tablet Take 650 mg by mouth every 4 (four) hours as needed for mild pain or moderate pain.   apixaban (ELIQUIS) 5 MG TABS tablet Take 5 mg by mouth 2 (two) times daily.   ARIPiprazole (ABILIFY) 10 MG tablet Take 10 mg by mouth every evening.   brivaracetam (BRIVIACT) 25 MG TABS tablet Take 25 mg by mouth daily.   furosemide (LASIX) 40 MG tablet Take 40 mg by mouth daily as needed for fluid or edema.   gabapentin (NEURONTIN) 100 MG capsule Take 200 mg by mouth 3 (three) times daily.   lamoTRIgine (LAMICTAL) 150 MG tablet Take 150 mg by mouth daily.   lamoTRIgine (LAMICTAL) 200 MG tablet Take 200 mg by mouth at bedtime.   metaxalone (SKELAXIN) 800 MG tablet Take 800 mg by mouth 3 (three) times daily as needed for muscle spasms.   metoprolol tartrate (LOPRESSOR) 25 MG tablet Take 25 mg by mouth daily.   midodrine (PROAMATINE) 2.5 MG tablet Take 2.5 mg by mouth 3 (three) times daily with meals.   montelukast (SINGULAIR) 10 MG tablet TAKE ONE TABLET BY MOUTH AT BEDTIME   polyethylene glycol (MIRALAX / GLYCOLAX) 17 g packet Take 17 g by mouth daily as needed for mild constipation or moderate constipation.   rosuvastatin (CRESTOR) 40 MG tablet Take 1 tablet (40 mg total) by mouth daily. (Patient taking differently: Take 40 mg by mouth at bedtime.)   VENTOLIN HFA 108 (90 Base) MCG/ACT inhaler INHALE 2 PUFFS INTO THE LUNGS EVERY 6 HOURS AS NEEDED FOR WHEEZING OR  SHORTNESS OF BREATH   [DISCONTINUED] ARIPiprazole (ABILIFY) 5 MG tablet Take 5 mg by mouth daily. For 2 days   [DISCONTINUED] brivaracetam (BRIVIACT) 25 MG TABS tablet Take 1 tablet (25 mg total) by mouth 2 (two) times daily. (Patient taking differently: Take 25 mg by mouth daily.  In the morning for 1 week then D/C)    Review of Systems  Immunization History  Administered Date(s) Administered   Fluad Quad(high Dose 65+) 08/06/2019, 08/25/2020, 08/23/2021, 08/28/2022   Hep A / Hep B 05/09/2016, 06/11/2016, 11/12/2016   Influenza Split 10/10/2013   Influenza Whole 10/10/2011   Influenza, High Dose Seasonal PF 08/19/2016, 09/05/2017   Influenza,inj,Quad PF,6+ Mos 08/01/2014, 08/04/2015   Influenza-Unspecified 09/05/2017, 09/17/2018, 09/21/2019   Moderna Covid-19 Vaccine Bivalent Booster 69yrs & up 08/31/2021   Moderna Sars-Covid-2 Vaccination 12/16/2019, 01/13/2020, 10/17/2020, 03/16/2021   Pneumococcal Conjugate-13 01/10/2014   Pneumococcal Polysaccharide-23 06/25/2012   Tdap 06/25/2012   Zoster Recombinat (Shingrix) 04/24/2017, 08/26/2017   Zoster, Live 01/10/2006   Pertinent  Health Maintenance Due  Topic Date Due   INFLUENZA VACCINE  07/03/2023   DEXA SCAN  Completed      06/26/2022   11:37 AM 08/28/2022    4:24 PM 10/03/2022    1:56 PM 02/03/2023   11:01 AM 05/08/2023   12:37 PM  Fall Risk  Falls in the past year? 1 1  1 1   Was there an injury with Fall? 1 1  1 1   Fall Risk Category Calculator 2 2  3 3   Fall Risk Category (Retired) Moderate Moderate     (RETIRED) Patient Fall Risk Level High fall risk High fall risk High fall risk    Patient at Risk for Falls Due to History of fall(s) History of fall(s);Impaired balance/gait  History of fall(s) History of fall(s)  Fall risk Follow up Falls evaluation completed Falls evaluation completed  Falls evaluation completed Falls evaluation completed   Functional Status Survey:    Vitals:   05/16/23 1354  BP: (!) 107/55  Pulse: 72   Weight: 137 lb (62.1 kg)  Height: 5\' 5"  (1.651 m)   Body mass index is 22.8 kg/m. Physical Exam Constitutional:      Comments: Disheveled in wheelchair.   Cardiovascular:     Rate and Rhythm: Normal rate.  Pulmonary:     Effort: Pulmonary effort is normal.  Neurological:     Mental Status: She is alert. She is disoriented.     Labs reviewed: Recent Labs    04/29/23 1500 05/17/23 1201 05/18/23 0418  NA 141 136 137  K 3.2* 3.7 3.6  CL 102 100 103  CO2 28 26 25   GLUCOSE 99 100* 81  BUN 29* 28* 23  CREATININE 1.51* 1.13* 0.94  CALCIUM 9.4 9.2 8.7*  MG 2.1  --   --    Recent Labs    04/23/23 0127 04/29/23 1501 05/17/23 1201  AST 50* 33 47*  ALT 27 24 26   ALKPHOS 111 109 123  BILITOT 1.0 0.9 1.5*  PROT 6.5 6.1* 6.8  ALBUMIN 4.2 3.7 4.3   Recent Labs    04/03/23 1335 04/17/23 0529 04/19/23 1331 04/23/23 0127 04/29/23 1500 04/30/23 0534 05/17/23 1113 05/18/23 0418  WBC 3.8* 3.4*   < > 4.3   < > 3.8* 5.3 4.7  NEUTROABS 2.6 2.2  --  3.1  --   --   --   --   HGB 8.9* 9.0*   < > 9.8*   < > 8.4* 8.8* 8.1*  HCT 28.0* 28.6*   < > 31.2*   < > 27.1* 28.3* 25.7*  MCV 93.0 95.0   < > 95.1   < > 95.1 96.3 94.8  PLT 111* 91*   < > 107*   < > 117* 126* 116*   < > =  values in this interval not displayed.   Lab Results  Component Value Date   TSH 4.842 (H) 04/29/2023   Lab Results  Component Value Date   HGBA1C 5.5 02/11/2020   Lab Results  Component Value Date   CHOL 143 05/21/2019   HDL 59.50 05/21/2019   LDLCALC 71 05/21/2019   LDLDIRECT 113.0 02/15/2016   TRIG 60.0 05/21/2019   CHOLHDL 2 05/21/2019    Significant Diagnostic Results in last 30 days:  CT HEAD WO CONTRAST ( )  Result Date: 05/18/2023 CLINICAL DATA:  Delirium. EXAM: CT HEAD WITHOUT CONTRAST TECHNIQUE: Contiguous axial images were obtained from the base of the skull through the vertex without intravenous contrast. RADIATION DOSE REDUCTION: This exam was performed according to the  departmental dose-optimization program which includes automated exposure control, adjustment of the mA and/or kV according to patient size and/or use of iterative reconstruction technique. COMPARISON:  CT head without contrast 04/29/2023 FINDINGS: Brain: Mild atrophy and white matter changes are similar the prior study. No acute infarct, hemorrhage, or mass lesion is present. Deep brain nuclei are within normal limits. The ventricles are of normal size. No significant extraaxial fluid collection is present. The brainstem and cerebellum are within normal limits. Midline structures are within normal limits. Vascular: Atherosclerotic calcifications are present within the cavernous internal carotid arteries bilaterally. No hyperdense vessel is present. Skull: A left parietal scalp hematoma is present. Underlying fracture or foreign body is present. Sinuses/Orbits: The paranasal sinuses and mastoid air cells are clear. Right lens replacement is present. Globes and orbits are otherwise within normal limits. IMPRESSION: 1. Left parietal scalp hematoma without underlying fracture or foreign body. 2. Stable mild atrophy and white matter disease. This likely reflects the sequela of chronic microvascular ischemia. 3. No acute intracranial abnormality or significant interval change. Electronically Signed   By: Marin Roberts M.D.   On: 05/18/2023 17:45   CT ABDOMEN PELVIS W CONTRAST  Result Date: 05/17/2023 CLINICAL DATA:  82 year old female with abdominal pain, falls, bruising to the abdomen and back. EXAM: CT ABDOMEN AND PELVIS WITH CONTRAST TECHNIQUE: Multidetector CT imaging of the abdomen and pelvis was performed using the standard protocol following bolus administration of intravenous contrast. RADIATION DOSE REDUCTION: This exam was performed according to the departmental dose-optimization program which includes automated exposure control, adjustment of the mA and/or kV according to patient size and/or use of  iterative reconstruction technique. CONTRAST:  OMNIPAQUE IOHEXOL 300 MG/ML  SOLN COMPARISON:  CT Abdomen and Pelvis 04/23/2023. FINDINGS: Lower chest: Partially visible cardiomegaly appears stable. No pericardial or pleural effusion. Partially visible cardiac pacer lead. Hepatobiliary: Numerous chronic hepatic cysts have not significantly changed since 2017 and are benign (no follow-up imaging recommended). Diminutive, negative gallbladder. Pancreas: Within normal limits. Spleen: Negative. Adrenals/Urinary Tract: Normal adrenal glands. Nonobstructed kidneys. Occasional chronic renal cysts also present in 2017 (no follow-up imaging recommended). Symmetric renal enhancement and contrast excretion with no hydronephrosis or hydroureter. But the urinary bladder is markedly distended today (sagittal image 63). Estimated bladder volume 920 mL. Stomach/Bowel: No dilated large or small bowel. Retained stool throughout the colon. Normal appendix on series 2, image 43. Decompressed stomach. No free air or free fluid. Vascular/Lymphatic: Extensive Aortoiliac calcified atherosclerosis. Major arterial structures remain patent. No lymphadenopathy. Early portal venous timing on the initial phase, on the delayed images the main portal venous structures appear to be patent. Reproductive: Negative. Other: No pelvic free fluid. Musculoskeletal: Moderate to severe dextroconvex lumbar scoliosis and widespread lumbar, lower thoracic spine degeneration.  Chronic lower lumbar spondylolisthesis. Chronic right posterior rib fractures appear stable. But there is a non healed transverse fracture of the sacrum through the S3 level (sagittal images 56 through 61) which is now minimally displaced, but likely subacute. Superimposed bilateral sacral ala insufficiency fractures with sclerosis, stable. SI joints remain intact. Chronic fractures of the right pubic rami, bordering the anterior right acetabulum again noted. Previous left femur ORIF.  IMPRESSION: 1. A transverse fracture through the Sacrum at S3 is mildly displaced, but appears subacute, and is superimposed on multiple chronic sacral and pelvic fractures. Query associated S3 to S5 sacral radiculopathy. 2. Marked urinary bladder distension (920 mL). Query urinary retention. 3. No other acute or inflammatory process identified in the abdomen or pelvis. Aortic Atherosclerosis (ICD10-I70.0). Electronically Signed   By: Odessa Fleming M.D.   On: 05/17/2023 13:04   DG Chest Port 1 View  Result Date: 04/29/2023 CLINICAL DATA:  [id number] with altered mental status, unresponsive at [facility] today. EXAM: PORTABLE CHEST 1 VIEW COMPARISON:  Portable chest 04/23/2023 FINDINGS: Aagain noted left chest dual lead pacing system with stable wire insertions, left atrial appendage closure device and metallic mitral valve prosthesis. Moderate to severe cardiomegaly is also again seen with the interval development of central vascular prominence and interstitial edema. There are perihilar alveolar opacities which could be due to alveolar edema, pneumonia or combination. Small pleural effusions are forming but no substantial pleural effusion at this time. The mediastinum is stable with aortic tortuosity and calcific plaques. Osteopenia and thoracic spondylosis. IMPRESSION: 1. Moderate to severe cardiomegaly with central vascular prominence and interstitial edema, consistent with CHF or fluid overload. 2. Perihilar alveolar opacities which could be due to alveolar edema, pneumonia or combination. 3. Small pleural effusions. 4. Aortic atherosclerosis and uncoiling. Electronically Signed   By: Almira Bar M.D.   On: 04/29/2023 20:17   EEG adult  Result Date: 04/29/2023 Charlsie Quest, MD     04/29/2023  5:15 PM Patient Name: [name] [name] MRN: [id number] Epilepsy Attending: Charlsie Quest Referring Physician/Provider: Gordy Councilman, MD Date: 04/29/2023 Duration: 26.58 mins Patient history: 82yo F after an  episode of unresponsiveness. EEG to evaluate for seizure Level of alertness: Awake, asleep AEDs during EEG study: None Technical aspects: This EEG study was done with scalp electrodes positioned according to the 10-20 International system of electrode placement. Electrical activity was reviewed with band pass filter of 1-70Hz , sensitivity of 7 uV/mm, display speed of 63mm/sec with a 60Hz  notched filter applied as appropriate. EEG data were recorded continuously and digitally stored.  Video monitoring was available and reviewed as appropriate. Description: The posterior dominant rhythm consists of 8-9 Hz activity of moderate voltage (25-35 uV) seen predominantly in posterior head regions, symmetric and reactive to eye opening and eye closing. Sleep was characterized by vertex waves, sleep spindles (12 to 14 Hz), maximal frontocentral region. Hyperventilation and photic stimulation were not performed.   IMPRESSION: This study is within normal limits. No seizures or epileptiform discharges were seen throughout the recording. A normal interictal EEG does not exclude the diagnosis of epilepsy. Charlsie Quest   CT Head Wo Contrast  Result Date: 04/29/2023 CLINICAL DATA:  Delirium, found down EXAM: CT HEAD WITHOUT CONTRAST TECHNIQUE: Contiguous axial images were obtained from the base of the skull through the vertex without intravenous contrast. RADIATION DOSE REDUCTION: This exam was performed according to the departmental dose-optimization program which includes automated exposure control, adjustment of the mA and/or kV according to patient  size and/or use of iterative reconstruction technique. COMPARISON:  04/23/2023 FINDINGS: Brain: Normal anatomic configuration. Parenchymal volume is relatively well preserved given the patient's age. Mild periventricular white matter changes are present likely reflecting the sequela of small vessel ischemia. No abnormal intra or extra-axial mass lesion or fluid collection. No  abnormal mass effect or midline shift. No evidence of acute intracranial hemorrhage or infarct. Ventricular size is normal. Cerebellum unremarkable. Vascular: No asymmetric hyperdense vasculature at the skull base. Skull: Intact Sinuses/Orbits: Paranasal sinuses are clear. Orbits are unremarkable. Other: Mastoid air cells and middle ear cavities are clear. IMPRESSION: 1. No acute intracranial hemorrhage or infarct. 2. Mild senescent change. Electronically Signed   By: Helyn Numbers M.D.   On: 04/29/2023 15:35   CT ABDOMEN PELVIS W CONTRAST  Result Date: 04/23/2023 CLINICAL DATA:  Nausea and vomiting.  Diffuse abdominal pain EXAM: CT ABDOMEN AND PELVIS WITH CONTRAST TECHNIQUE: Multidetector CT imaging of the abdomen and pelvis was performed using the standard protocol following bolus administration of intravenous contrast. RADIATION DOSE REDUCTION: This exam was performed according to the departmental dose-optimization program which includes automated exposure control, adjustment of the mA and/or kV according to patient size and/or use of iterative reconstruction technique. CONTRAST:  75mL OMNIPAQUE IOHEXOL 300 MG/ML  SOLN COMPARISON:  01/26/2023 FINDINGS: Lower chest: Cardiomegaly with right ventricular pacer lead and mitral valve replacement, partially covered. No acute finding in the lower chest Hepatobiliary: Innumerable liver cysts accentuated around the gallbladder fossa. No evidence of biliary calcification or obstruction. Pancreas: Unremarkable. Spleen: Unremarkable. Adrenals/Urinary Tract: Negative adrenals. No hydronephrosis or stone. 3 cm right renal cyst. No follow-up imaging is recommended given simple appearance. Unremarkable bladder. Stomach/Bowel: No obstruction. Generalized stool desiccation. No evidence of bowel inflammation. Vascular/Lymphatic: No acute vascular abnormality. Scattered atheromatous calcification. No mass or adenopathy. Reproductive:No pathologic findings. Other: No ascites or  pneumoperitoneum. Shallow right groin hernia containing trace ascitic fluid. Musculoskeletal: Remote right lower rib fractures. Chronic sacral insufficiency fractures with bilateral and midline sclerosis. Chronic right sacral ala and right pubic body fractures with incomplete bony bridging at the ala. Postoperative proximal left femur. Lumbar spine degeneration is severe with pronounced dextroscoliosis. IMPRESSION: 1. No acute finding. 2. Numerous chronic findings are listed above. Electronically Signed   By: Tiburcio Pea M.D.   On: 04/23/2023 05:44   DG Chest Portable 1 View  Result Date: 04/23/2023 CLINICAL DATA:  Altered, bizarre behavior EXAM: PORTABLE CHEST 1 VIEW COMPARISON:  Chest radiograph 03/18/2023 FINDINGS: Stable cardiomegaly. Left atrial appendage occlusion device. AVR. Left chest wall pacemaker. Chronic interstitial coarsening. Chronic scar right mid lung. No pleural effusion or pneumothorax. No displaced rib fractures. IMPRESSION: No active disease. Cardiomegaly and chronic interstitial coarsening. Electronically Signed   By: Minerva Fester M.D.   On: 04/23/2023 02:02   CT HEAD WO CONTRAST ( )  Result Date: 04/23/2023 CLINICAL DATA:  Bizarre behavior on Eliquis. EXAM: CT HEAD WITHOUT CONTRAST TECHNIQUE: Contiguous axial images were obtained from the base of the skull through the vertex without intravenous contrast. RADIATION DOSE REDUCTION: This exam was performed according to the departmental dose-optimization program which includes automated exposure control, adjustment of the mA and/or kV according to patient size and/or use of iterative reconstruction technique. COMPARISON:  CT 04/19/2023 FINDINGS: Brain: No intracranial hemorrhage, mass effect, or evidence of acute infarct. No hydrocephalus. No extra-axial fluid collection. Mild generalized cerebral atrophy. Vascular: No hyperdense vessel. Intracranial arterial calcification. Skull: No fracture or focal lesion. Sinuses/Orbits: No  acute finding. Frothy mucous in the left  sphenoid sinus. Paranasal sinuses and mastoid air cells are otherwise well aerated. Other: None. IMPRESSION: 1. No acute intracranial abnormality. Electronically Signed   By: Minerva Fester M.D.   On: 04/23/2023 01:46   CT ANGIO HEAD NECK W WO CM  Result Date: 04/19/2023 CLINICAL DATA:  No deficit, acute, stroke suspected. Sudden onset of weakness the neck down. Unsteady gait. EXAM: CT ANGIOGRAPHY HEAD AND NECK WITH AND WITHOUT CONTRAST TECHNIQUE: Multidetector CT imaging of the head and neck was performed using the standard protocol during bolus administration of intravenous contrast. Multiplanar CT image reconstructions and MIPs were obtained to evaluate the vascular anatomy. Carotid stenosis measurements (when applicable) are obtained utilizing NASCET criteria, using the distal internal carotid diameter as the denominator. RADIATION DOSE REDUCTION: This exam was performed according to the departmental dose-optimization program which includes automated exposure control, adjustment of the mA and/or kV according to patient size and/or use of iterative reconstruction technique. CONTRAST:  75mL OMNIPAQUE IOHEXOL 350 MG/ML SOLN COMPARISON:  CT head without contrast/16/24 FINDINGS: CT HEAD FINDINGS Brain: No acute infarct, hemorrhage, or mass lesion is present. No significant white matter lesions are present. Deep brain nuclei are within normal limits. The ventricles are of normal size. No significant extraaxial fluid collection is present. The brainstem and cerebellum are within normal limits. Midline structures are within normal limits. Vascular: Atherosclerotic calcifications are present within the cavernous internal carotid arteries bilaterally. No hyperdense vessel is present. Skull: Calvarium is intact. No focal lytic or blastic lesions are present. No significant extracranial soft tissue lesion is present. Sinuses/Orbits: The paranasal sinuses and mastoid air cells  are clear. Bilateral lens replacements are noted. Globes and orbits are otherwise unremarkable. Other: Review of the MIP images confirms the above findings CTA NECK FINDINGS Aortic arch: A 3 vessel arch configuration is present. Atherosclerotic calcifications are present at the origin of the left subclavian artery without significant stenosis. Origins of the innominate artery and left common carotid artery are normal. Right carotid system: Right common carotid artery is within normal limits. Minimal calcifications present bifurcation. No significant stenosis is present. The cervical right ICA is otherwise normal. Left carotid system: The left common carotid artery is within normal limits. Atherosclerotic calcifications are present at the carotid bifurcation. Mild tortuosity is present the cervical left ICA without significant stenosis. Vertebral arteries: The right vertebral artery is slightly dominant to the left. Both vertebral arteries originate from the subclavian arteries without significant stenosis. No significant stenosis is present in either vertebral artery in the neck. Skeleton: The vertebral body heights and alignment are normal. Asymmetric right-sided facet degenerative changes are present in the upper cervical spine. Mandible ORIF present. No focal osseous lesions are present. Other neck: Soft tissues the neck are otherwise unremarkable. Salivary glands are within normal limits. Thyroid is normal. No significant adenopathy is present. No focal mucosal or submucosal lesions are present. Upper chest: Right greater than left pleural effusions are present. Patchy ground-glass attenuation is present at both lungs. Review of the MIP images confirms the above findings CTA HEAD FINDINGS Anterior circulation: Atherosclerotic calcifications are present within the cavernous internal carotid arteries bilaterally without significant stenosis through the ICA termini. The A1 and M1 segments are normal. The anterior  communicating artery is patent. MCA bifurcations are within normal limits. The ACA and MCA branch vessels are normal. Aneurysm is present. Posterior circulation: The right vertebral artery is dominant. The PICA origins are visualized and normal. The vertebrobasilar junction and basilar artery is normal. The superior cerebellar arteries  are within normal limits bilaterally posterior cerebral arteries originate from the basilar tip. The PCA branch vessels. Venous sinuses: The dural sinuses are patent. The straight sinus and deep cerebral veins are intact. Cortical veins are within normal limits. No significant vascular malformation is evident. Anatomic variants: None Review of the MIP images confirms the above findings IMPRESSION: 1. No acute or focal lesion to explain the patient's symptoms. 2. Atherosclerotic changes at the carotid bifurcations and cavernous internal carotid arteries bilaterally without significant stenosis. 3. Normal variant CTA Circle of Willis without significant proximal stenosis, aneurysm, or branch vessel occlusion. 4. Right greater than left pleural effusions. 5. Patchy ground-glass attenuation at both lungs. This is nonspecific, but can be seen in the setting of edema or infection. 6. Asymmetric right-sided facet degenerative changes in the upper cervical spine. Electronically Signed   By: Marin Roberts M.D.   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 Unclear if this is primarily memory vs psychiatric in nature. At this time will plan to continue titration of abilify for desired results. Will trial a secondary antipsychotic if necessary to aid with stabilizing her current mental state. Hypotension improving  with midodrine. 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 Will likely need sedating medications and will order accordingly. At this time patient will admit to long term care in the memory care center.   Family/ staff Communication: children, DON, SW  Labs/tests ordered:  MRI brain   I spent greater than 90 minutes for the care of this patient in face to face time, chart review, clinical documentation, patient education.

## 2023-05-17 ENCOUNTER — Inpatient Hospital Stay
Admission: EM | Admit: 2023-05-17 | Discharge: 2023-05-20 | DRG: 378 | Disposition: A | Discharge status: Skilled Nursing Facility | Payer: Medicare Other | Source: Skilled Nursing Facility | Attending: Internal Medicine | Admitting: Internal Medicine

## 2023-05-17 ENCOUNTER — Encounter: Payer: Self-pay | Admitting: Emergency Medicine

## 2023-05-17 ENCOUNTER — Other Ambulatory Visit: Payer: Self-pay

## 2023-05-17 ENCOUNTER — Emergency Department: Payer: Medicare Other

## 2023-05-17 DIAGNOSIS — M19042 Primary osteoarthritis, left hand: Secondary | ICD-10-CM | POA: Diagnosis present

## 2023-05-17 DIAGNOSIS — D509 Iron deficiency anemia, unspecified: Secondary | ICD-10-CM | POA: Diagnosis not present

## 2023-05-17 DIAGNOSIS — D500 Iron deficiency anemia secondary to blood loss (chronic): Secondary | ICD-10-CM | POA: Diagnosis present

## 2023-05-17 DIAGNOSIS — D696 Thrombocytopenia, unspecified: Secondary | ICD-10-CM | POA: Diagnosis present

## 2023-05-17 DIAGNOSIS — Z823 Family history of stroke: Secondary | ICD-10-CM

## 2023-05-17 DIAGNOSIS — I480 Paroxysmal atrial fibrillation: Secondary | ICD-10-CM | POA: Diagnosis present

## 2023-05-17 DIAGNOSIS — Z881 Allergy status to other antibiotic agents status: Secondary | ICD-10-CM

## 2023-05-17 DIAGNOSIS — R440 Auditory hallucinations: Secondary | ICD-10-CM | POA: Insufficient documentation

## 2023-05-17 DIAGNOSIS — R339 Retention of urine, unspecified: Secondary | ICD-10-CM | POA: Diagnosis not present

## 2023-05-17 DIAGNOSIS — K2970 Gastritis, unspecified, without bleeding: Secondary | ICD-10-CM | POA: Diagnosis present

## 2023-05-17 DIAGNOSIS — R140 Abdominal distension (gaseous): Secondary | ICD-10-CM | POA: Diagnosis present

## 2023-05-17 DIAGNOSIS — R569 Unspecified convulsions: Secondary | ICD-10-CM | POA: Diagnosis present

## 2023-05-17 DIAGNOSIS — Z7901 Long term (current) use of anticoagulants: Secondary | ICD-10-CM

## 2023-05-17 DIAGNOSIS — F29 Unspecified psychosis not due to a substance or known physiological condition: Secondary | ICD-10-CM | POA: Diagnosis present

## 2023-05-17 DIAGNOSIS — Z77090 Contact with and (suspected) exposure to asbestos: Secondary | ICD-10-CM | POA: Diagnosis present

## 2023-05-17 DIAGNOSIS — I700 Atherosclerosis of aorta: Secondary | ICD-10-CM | POA: Diagnosis present

## 2023-05-17 DIAGNOSIS — I5032 Chronic diastolic (congestive) heart failure: Secondary | ICD-10-CM | POA: Diagnosis present

## 2023-05-17 DIAGNOSIS — N3001 Acute cystitis with hematuria: Secondary | ICD-10-CM | POA: Diagnosis present

## 2023-05-17 DIAGNOSIS — K219 Gastro-esophageal reflux disease without esophagitis: Secondary | ICD-10-CM | POA: Diagnosis present

## 2023-05-17 DIAGNOSIS — M810 Age-related osteoporosis without current pathological fracture: Secondary | ICD-10-CM | POA: Diagnosis present

## 2023-05-17 DIAGNOSIS — B952 Enterococcus as the cause of diseases classified elsewhere: Secondary | ICD-10-CM | POA: Diagnosis present

## 2023-05-17 DIAGNOSIS — Z952 Presence of prosthetic heart valve: Secondary | ICD-10-CM

## 2023-05-17 DIAGNOSIS — I272 Pulmonary hypertension, unspecified: Secondary | ICD-10-CM | POA: Diagnosis present

## 2023-05-17 DIAGNOSIS — I6529 Occlusion and stenosis of unspecified carotid artery: Secondary | ICD-10-CM | POA: Diagnosis present

## 2023-05-17 DIAGNOSIS — K920 Hematemesis: Secondary | ICD-10-CM | POA: Diagnosis not present

## 2023-05-17 DIAGNOSIS — Z888 Allergy status to other drugs, medicaments and biological substances status: Secondary | ICD-10-CM

## 2023-05-17 DIAGNOSIS — I4510 Unspecified right bundle-branch block: Secondary | ICD-10-CM | POA: Diagnosis present

## 2023-05-17 DIAGNOSIS — Z8249 Family history of ischemic heart disease and other diseases of the circulatory system: Secondary | ICD-10-CM

## 2023-05-17 DIAGNOSIS — F419 Anxiety disorder, unspecified: Secondary | ICD-10-CM | POA: Diagnosis present

## 2023-05-17 DIAGNOSIS — I4819 Other persistent atrial fibrillation: Secondary | ICD-10-CM | POA: Diagnosis present

## 2023-05-17 DIAGNOSIS — R296 Repeated falls: Secondary | ICD-10-CM | POA: Diagnosis present

## 2023-05-17 DIAGNOSIS — D631 Anemia in chronic kidney disease: Secondary | ICD-10-CM | POA: Diagnosis not present

## 2023-05-17 DIAGNOSIS — Z950 Presence of cardiac pacemaker: Secondary | ICD-10-CM

## 2023-05-17 DIAGNOSIS — Z953 Presence of xenogenic heart valve: Secondary | ICD-10-CM

## 2023-05-17 DIAGNOSIS — E785 Hyperlipidemia, unspecified: Secondary | ICD-10-CM | POA: Diagnosis present

## 2023-05-17 DIAGNOSIS — R911 Solitary pulmonary nodule: Secondary | ICD-10-CM | POA: Diagnosis present

## 2023-05-17 DIAGNOSIS — M19041 Primary osteoarthritis, right hand: Secondary | ICD-10-CM | POA: Diagnosis present

## 2023-05-17 DIAGNOSIS — Z85828 Personal history of other malignant neoplasm of skin: Secondary | ICD-10-CM

## 2023-05-17 DIAGNOSIS — D693 Immune thrombocytopenic purpura: Secondary | ICD-10-CM | POA: Diagnosis present

## 2023-05-17 DIAGNOSIS — M1909 Primary osteoarthritis, other specified site: Secondary | ICD-10-CM | POA: Diagnosis present

## 2023-05-17 DIAGNOSIS — K922 Gastrointestinal hemorrhage, unspecified: Secondary | ICD-10-CM | POA: Diagnosis present

## 2023-05-17 DIAGNOSIS — Z66 Do not resuscitate: Secondary | ICD-10-CM | POA: Diagnosis present

## 2023-05-17 DIAGNOSIS — N1831 Chronic kidney disease, stage 3a: Secondary | ICD-10-CM | POA: Diagnosis present

## 2023-05-17 DIAGNOSIS — F0390 Unspecified dementia without behavioral disturbance: Secondary | ICD-10-CM | POA: Diagnosis not present

## 2023-05-17 DIAGNOSIS — Z515 Encounter for palliative care: Secondary | ICD-10-CM | POA: Diagnosis not present

## 2023-05-17 DIAGNOSIS — Z885 Allergy status to narcotic agent status: Secondary | ICD-10-CM

## 2023-05-17 DIAGNOSIS — Z79899 Other long term (current) drug therapy: Secondary | ICD-10-CM

## 2023-05-17 DIAGNOSIS — F418 Other specified anxiety disorders: Secondary | ICD-10-CM | POA: Diagnosis present

## 2023-05-17 DIAGNOSIS — M5431 Sciatica, right side: Secondary | ICD-10-CM | POA: Diagnosis present

## 2023-05-17 DIAGNOSIS — Z8673 Personal history of transient ischemic attack (TIA), and cerebral infarction without residual deficits: Secondary | ICD-10-CM

## 2023-05-17 DIAGNOSIS — I495 Sick sinus syndrome: Secondary | ICD-10-CM | POA: Diagnosis present

## 2023-05-17 DIAGNOSIS — Z886 Allergy status to analgesic agent status: Secondary | ICD-10-CM

## 2023-05-17 DIAGNOSIS — N189 Chronic kidney disease, unspecified: Secondary | ICD-10-CM | POA: Diagnosis present

## 2023-05-17 LAB — COMPREHENSIVE METABOLIC PANEL
ALT: 26 U/L (ref 0–44)
AST: 47 U/L — ABNORMAL HIGH (ref 15–41)
Albumin: 4.3 g/dL (ref 3.5–5.0)
Alkaline Phosphatase: 123 U/L (ref 38–126)
Anion gap: 10 (ref 5–15)
BUN: 28 mg/dL — ABNORMAL HIGH (ref 8–23)
CO2: 26 mmol/L (ref 22–32)
Calcium: 9.2 mg/dL (ref 8.9–10.3)
Chloride: 100 mmol/L (ref 98–111)
Creatinine, Ser: 1.13 mg/dL — ABNORMAL HIGH (ref 0.44–1.00)
GFR, Estimated: 49 mL/min — ABNORMAL LOW (ref >60)
Glucose, Bld: 100 mg/dL — ABNORMAL HIGH (ref 70–99)
Potassium: 3.7 mmol/L (ref 3.5–5.1)
Sodium: 136 mmol/L (ref 135–145)
Total Bilirubin: 1.5 mg/dL — ABNORMAL HIGH (ref 0.3–1.2)
Total Protein: 6.8 g/dL (ref 6.5–8.1)

## 2023-05-17 LAB — URINALYSIS, W/ REFLEX TO CULTURE (INFECTION SUSPECTED)
Bilirubin Urine: NEGATIVE
Glucose, UA: NEGATIVE mg/dL
Ketones, ur: NEGATIVE mg/dL
Nitrite: NEGATIVE
Protein, ur: 30 mg/dL — AB
Specific Gravity, Urine: 1.019 (ref 1.005–1.030)
Squamous Epithelial / HPF: NONE SEEN /HPF (ref 0–5)
WBC, UA: >50 WBC/hpf (ref 0–5)
pH: 6 (ref 5.0–8.0)

## 2023-05-17 LAB — CBC
HCT: 28.3 % — ABNORMAL LOW (ref 36.0–46.0)
Hemoglobin: 8.8 g/dL — ABNORMAL LOW (ref 12.0–15.0)
MCH: 29.9 pg (ref 26.0–34.0)
MCHC: 31.1 g/dL (ref 30.0–36.0)
MCV: 96.3 fL (ref 80.0–100.0)
Platelets: 126 10*3/uL — ABNORMAL LOW (ref 150–400)
RBC: 2.94 MIL/uL — ABNORMAL LOW (ref 3.87–5.11)
RDW: 17.5 % — ABNORMAL HIGH (ref 11.5–15.5)
WBC: 5.3 10*3/uL (ref 4.0–10.5)
nRBC: 0 % (ref 0.0–0.2)

## 2023-05-17 LAB — LACTIC ACID, PLASMA: Lactic Acid, Venous: 1.3 mmol/L (ref 0.5–1.9)

## 2023-05-17 LAB — TYPE AND SCREEN
ABO/RH(D): A POS
Antibody Screen: NEGATIVE

## 2023-05-17 MED ORDER — SODIUM CHLORIDE 0.9 % IV SOLN: 1.0000 g | INTRAVENOUS | Status: DC

## 2023-05-17 MED ORDER — HALOPERIDOL LACTATE 5 MG/ML IJ SOLN: 2.0000 mg | Freq: Four times a day (QID) | INTRAMUSCULAR | Status: DC | PRN

## 2023-05-17 MED ORDER — PANTOPRAZOLE 80MG IVPB - SIMPLE MED
80.0000 mg | Freq: Once | INTRAVENOUS | Status: AC
  Administered 2023-05-17: 80 mg via INTRAVENOUS
  Filled 2023-05-17: qty 100

## 2023-05-17 MED ORDER — SENNOSIDES-DOCUSATE SODIUM 8.6-50 MG PO TABS: 1.0000 | ORAL_TABLET | Freq: Every evening | ORAL | Status: DC | PRN

## 2023-05-17 MED ORDER — ARIPIPRAZOLE 10 MG PO TABS
10.0000 mg | ORAL_TABLET | Freq: Every evening | ORAL | Status: DC
  Administered 2023-05-17 – 2023-05-20 (×4): 10 mg via ORAL
  Filled 2023-05-17 (×4): qty 1

## 2023-05-17 MED ORDER — ALBUTEROL SULFATE (2.5 MG/3ML) 0.083% IN NEBU: 2.5000 mg | INHALATION_SOLUTION | Freq: Four times a day (QID) | RESPIRATORY_TRACT | Status: DC | PRN

## 2023-05-17 MED ORDER — SODIUM CHLORIDE 0.9 % IV SOLN
1.0000 g | INTRAVENOUS | Status: DC
  Administered 2023-05-18: 1 g via INTRAVENOUS
  Filled 2023-05-17: qty 10

## 2023-05-17 MED ORDER — LAMOTRIGINE 100 MG PO TABS
200.0000 mg | ORAL_TABLET | Freq: Every day | ORAL | Status: DC
  Administered 2023-05-17 – 2023-05-19 (×3): 200 mg via ORAL
  Filled 2023-05-17 (×3): qty 2

## 2023-05-17 MED ORDER — ONDANSETRON HCL 4 MG/2ML IJ SOLN
4.0000 mg | Freq: Four times a day (QID) | INTRAMUSCULAR | Status: DC | PRN
  Administered 2023-05-19: 4 mg via INTRAVENOUS
  Filled 2023-05-17: qty 2

## 2023-05-17 MED ORDER — METAXALONE 800 MG PO TABS: 800.0000 mg | ORAL_TABLET | Freq: Three times a day (TID) | ORAL | Status: DC | PRN

## 2023-05-17 MED ORDER — ROSUVASTATIN CALCIUM 10 MG PO TABS
40.0000 mg | ORAL_TABLET | Freq: Every day | ORAL | Status: DC
  Administered 2023-05-17 – 2023-05-19 (×3): 40 mg via ORAL
  Filled 2023-05-17: qty 2
  Filled 2023-05-17 (×3): qty 4

## 2023-05-17 MED ORDER — GABAPENTIN 100 MG PO CAPS
200.0000 mg | ORAL_CAPSULE | Freq: Three times a day (TID) | ORAL | Status: DC
  Administered 2023-05-17 – 2023-05-20 (×9): 200 mg via ORAL
  Filled 2023-05-17 (×9): qty 2

## 2023-05-17 MED ORDER — METOPROLOL TARTRATE 25 MG PO TABS
25.0000 mg | ORAL_TABLET | Freq: Every day | ORAL | Status: DC
  Administered 2023-05-18 – 2023-05-19 (×2): 25 mg via ORAL
  Filled 2023-05-17 (×2): qty 1

## 2023-05-17 MED ORDER — HALOPERIDOL 2 MG PO TABS: 2.0000 mg | ORAL_TABLET | Freq: Four times a day (QID) | ORAL | Status: DC | PRN

## 2023-05-17 MED ORDER — SODIUM CHLORIDE 0.9 % IV SOLN
1.0000 g | Freq: Once | INTRAVENOUS | Status: AC
  Administered 2023-05-17: 1 g via INTRAVENOUS
  Filled 2023-05-17: qty 10

## 2023-05-17 MED ORDER — IOHEXOL 300 MG/ML  SOLN
100.0000 mL | Freq: Once | INTRAMUSCULAR | Status: AC | PRN
  Administered 2023-05-17: 100 mL via INTRAVENOUS

## 2023-05-17 MED ORDER — MIDODRINE HCL 5 MG PO TABS
2.5000 mg | ORAL_TABLET | Freq: Three times a day (TID) | ORAL | Status: DC
  Administered 2023-05-18: 2.5 mg via ORAL
  Filled 2023-05-17: qty 1

## 2023-05-17 MED ORDER — PANTOPRAZOLE SODIUM 40 MG IV SOLR: 40.0000 mg | Freq: Two times a day (BID) | INTRAVENOUS | Status: DC

## 2023-05-17 MED ORDER — PANTOPRAZOLE INFUSION (NEW) - SIMPLE MED
8.0000 mg/h | INTRAVENOUS | Status: DC
  Administered 2023-05-17 – 2023-05-19 (×5): 8 mg/h via INTRAVENOUS
  Filled 2023-05-17 (×5): qty 100

## 2023-05-17 MED ORDER — ONDANSETRON HCL 4 MG PO TABS: 4.0000 mg | ORAL_TABLET | Freq: Four times a day (QID) | ORAL | Status: DC | PRN

## 2023-05-17 MED ORDER — ACETAMINOPHEN 325 MG PO TABS
650.0000 mg | ORAL_TABLET | Freq: Four times a day (QID) | ORAL | Status: DC | PRN
  Administered 2023-05-20: 650 mg via ORAL
  Filled 2023-05-17: qty 2

## 2023-05-17 MED ORDER — LAMOTRIGINE 25 MG PO TABS
150.0000 mg | ORAL_TABLET | Freq: Every morning | ORAL | Status: DC
  Administered 2023-05-18 – 2023-05-20 (×3): 150 mg via ORAL
  Filled 2023-05-17 (×3): qty 2

## 2023-05-17 MED ORDER — MONTELUKAST SODIUM 10 MG PO TABS
10.0000 mg | ORAL_TABLET | Freq: Every day | ORAL | Status: DC
  Administered 2023-05-17 – 2023-05-19 (×3): 10 mg via ORAL
  Filled 2023-05-17 (×3): qty 1

## 2023-05-17 MED ORDER — ACETAMINOPHEN 650 MG RE SUPP: 650.0000 mg | Freq: Four times a day (QID) | RECTAL | Status: DC | PRN

## 2023-05-17 MED ORDER — POLYETHYLENE GLYCOL 3350 17 G PO PACK: 17.0000 g | PACK | Freq: Every day | ORAL | Status: DC | PRN

## 2023-05-17 MED ORDER — LORAZEPAM 2 MG/ML IJ SOLN: 1.0000 mg | INTRAMUSCULAR | Status: DC | PRN

## 2023-05-17 NOTE — H&P (Signed)
History and Physical   [place]LE[place] WRITER MVH:[id number] DOB: December 24, 1940 DOA: 05/17/2023  PCP: Sherlene Shams, MD  Outpatient Specialists: Dr. Cristopher Peru, neurology Patient coming from: Nursing facility via EMS  I have personally briefly reviewed patient's old medical records in [facility] EMR.  Chief Concern: Abdominal distention, coffee-ground emesis  HPI: [name] is an 82 year old female with paroxysmal atrial fibrillation, on Eliquis, GERD, who presents to the emergency department for chief concerns of coffee-ground emesis, distended abdomen.  Vitals in the ED showed temperature 99.2, respiration rate of 18, heart rate 72, blood pressure 110/73, SpO2 99% on room air.  Serum sodium is 136, potassium 3.7, chloride 100, bicarb 26, BUN of 28, serum creatinine 1.13, EGFR 49, nonfasting blood glucose 100, WBC 5.3, hemoglobin 8.8, platelets of 126.  ED treatment: Pantoprazole 80 mg IV one-time dose. --------------------------- At bedside, patient was able to tell me her name, age, location, current calendar year.  She reports that on day of admission, she had 3 episodes of coffee-ground emesis.  She reports that she took a dose of Eliquis on a.m. prior to ED presentation.  She denies dysuria and endorses suprapubic tenderness.  She endorses that she has been peeing more than normal.  She denies diarrhea, fever, chills, chest pain, syncope, loss of consciousness.  Social history: She denies current tobacco use, EtOH, recreational drug use.  ROS: Constitutional: no weight change, no fever ENT/Mouth: no sore throat, no rhinorrhea Eyes: no eye pain, no vision changes Cardiovascular: no chest pain, no dyspnea,  no edema, no palpitations Respiratory: no cough, no sputum, no wheezing Gastrointestinal: + nausea, + vomiting, no diarrhea, no constipation Genitourinary: no urinary incontinence, no dysuria, no hematuria Musculoskeletal: no arthralgias, no myalgias Skin: no skin lesions,  no pruritus, Neuro: + weakness, no loss of consciousness, no syncope Psych: no anxiety, no depression, + decrease appetite Heme/Lymph: no bruising, no bleeding  ED Course: Discussed with emergency medicine provider, patient requiring hospitalization for chief concerns of upper GI bleed.  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  Stable at this time Status post protonix 80 mg IV once per EDP We will continue protonix gtt Gastroenterology has been consulted via Epic order and Staff message to Dr. Allegra Lai Admit to telemetry cardiac, inpatient  Chronic kidney disease (CKD) stage G3a/A2, moderately decreased glomerular filtration rate (GFR) between 45-59 mL/min/1.73 square meter and albuminuria creatinine ratio between 30-299 mg/g (HCC) At baseline  PAF (paroxysmal atrial fibrillation) (HCC) Holding home Eliquis on admission  Focal seizures [facility]) Per outpatient neurology note on 04/24/2023: Continue Lamotrigine 150 mg by mouth every morning + 200 mg by mouth every night  Lamotrigine has been continued per above  Anemia in chronic kidney disease At baseline  Chart reviewed.   DVT prophylaxis: Pharmacologic DVT prophylaxis not initiated on admission.  AM team to resume when the benefits outweigh the risk. Code Status: DNR per ACP documents stating patient's desire for allowance of natural death  Diet: Clear liquids Family Communication:  Updated son at bedside with patient's permission Disposition Plan: pending clinical course  Consults called: GI consulted via staff message Admission status: Telemetry cardiac, inpatient  Past Medical History:  Diagnosis Date   Acute on chronic diastolic (congestive) heart failure (HCC)    Allergy    See list   Anemia 2018   Anxiety    Occasionally take Xanax for sleep   Anxiety associated with depression    Prn alprazolam    Anxiety associated with depression    Arthritis    Hands, Back   Atrial fibrillation, persistent (HCC)    DCCV 08/22/2015   Bradycardia post-op bradycardia, pacer dependent   MDT PPM 11/06/15, Dr. Ladona Ridgel   Cataract    Left eye   Chronic kidney disease (CKD) stage G3a/A2, moderately decreased glomerular filtration rate (GFR) between 45-59 mL/min/1.73 square meter and albuminuria creatinine ratio between 30-299 mg/g (HCC) 11/13/2021   Closed bilateral fracture of pubic rami (HCC) 08/28/2022   Diverticulosis    Focal seizures (HCC)    Heart murmur    Hematuria, gross 04/09/2018   Hemorrhoid    Hepatic cyst    innumerable   History of asbestos exposure    Hyperlipidemia    IBS (irritable bowel syndrome)    Idiopathic thrombocytopenic purpura (ITP) (HCC)    Migraine    MVP (mitral valve prolapse)    Near syncope 01/26/2023   Nodule of right lung    Osteoporosis of forearm    RBBB    Restrictive lung disease    Mild on PFT & likely cardiac in etiology    Right sided sciatica 04/10/2022   S/P Minimally invasive maze operation for atrial fibrillation 10/31/2015   Complete bilateral atrial lesion set using cryothermy and bipolar radiofrequency ablation with clipping of LA appendage via right mini thoracotomy approach   S/P minimally invasive mitral valve replacement with bioprosthetic valve 10/31/2015   33 mm Edwards Magna Mitral bovine bioprosthetic tissue valve placed via right mini thoracotomy approach   Seizures (HCC)    left foot paralysis and left  hand paralysis Dr. 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   SVT (supraventricular tachycardia)    Thoracic aorta atherosclerosis (HCC)    TIA (transient ischemic attack)    Visit for preventive health examination 05/23/2016   Past Surgical History:  Procedure Laterality Date   BREAST EXCISIONAL BIOPSY Right Late 80s   Negative X2   CARDIAC CATHETERIZATION N/A 10/18/2015   Procedure: Right/Left Heart Cath and Coronary Angiography;  Surgeon: Tonny Bollman, MD;  Location: [facility] INVASIVE CV LAB;  Service: Cardiovascular;  Laterality: N/A;   CARDIOVERSION N/A 08/22/2015   Procedure: CARDIOVERSION;  Surgeon: Vesta Mixer, MD;  Location: [facility] ENDOSCOPY;  Service: Cardiovascular;  Laterality: N/A;   COLONOSCOPY  2003   EP IMPLANTABLE DEVICE N/A 11/06/2015   Procedure: Pacemaker Implant;  Surgeon: Marinus Maw, MD;  Location: [facility] INVASIVE CV LAB;  Service: Cardiovascular;  Laterality: N/A;   EP IMPLANTABLE DEVICE N/A 02/20/2016   Procedure: Lead Extraction;  Surgeon: Marinus Maw, MD;  Location: [facility] INVASIVE CV LAB;  Service: Cardiovascular;  Laterality: N/A;   EYE SURGERY Right 2013   FEMUR IM NAIL Left 06/18/2021   Procedure: INTRAMEDULLARY (IM) NAIL FEMORAL, OPEN REDUCTION INTERNAL FIXATION FEMUR RIGHT LITTLE FINGER PIP CLOSED REDUCTION;  Surgeon: Myrene Galas, MD;  Location: [facility] OR;  Service: Orthopedics;  Laterality: Left;  FOOT SURGERY     ~2007 right foot bunion   MANDIBLE FRACTURE SURGERY  03/26/2013   MINIMALLY INVASIVE MAZE PROCEDURE N/A 10/31/2015   Procedure: MINIMALLY INVASIVE MAZE PROCEDURE;  Surgeon: Purcell Nails, MD;  Location: [facility] OR;  Service: Open Heart Surgery;  Laterality: N/A;   MITRAL VALVE REPLACEMENT Right 10/31/2015   Procedure: MINIMALLY INVASIVE MITRAL VALVE (MV) REPLACEMENT;  Surgeon: Purcell Nails, MD;  Location: [facility] OR;  Service: Open Heart Surgery;  Laterality: Right;   PACEMAKER LEAD REMOVAL  02/20/2016    TEE WITH CARDIOVERSION     TEE WITHOUT CARDIOVERSION N/A 08/22/2015   Procedure: TRANSESOPHAGEAL ECHOCARDIOGRAM (TEE);  Surgeon: Vesta Mixer, MD;  Location: [facility] ENDOSCOPY;  Service: Cardiovascular;  Laterality: N/A;   TEE WITHOUT CARDIOVERSION N/A 10/31/2015   Procedure: TRANSESOPHAGEAL ECHOCARDIOGRAM (TEE);  Surgeon: Purcell Nails, MD;  [facility];  Service: Open Heart Surgery;  Laterality: N/A;   TUBAL LIGATION     VARICOSE VEIN SURGERY Right    Social History:  reports that she has never smoked. She has never used smokeless tobacco. She reports that she does not drink alcohol and does not use drugs.  Allergies  Allergen Reactions   Codeine Nausea And Vomiting and Other (See Comments)    migraine   Epinephrine Palpitations and Shortness Of Breath   Hydrocodone Nausea And Vomiting and Other (See Comments)    MIGRAINE   Hydromorphone Nausea And Vomiting and Other (See Comments)    migraine   Molds & Smuts Anxiety, Other (See Comments) and Shortness Of Breath   Oxycodone Nausea And Vomiting and Other (See Comments)    Severe migraine   Meloxicam Other (See Comments)    Severe reflux   Codeine Other (See Comments)    Migraine   Dextromethorphan Other (See Comments)    seizures   Diphen [Diphenhydramine Hcl] Other (See Comments)    seizure   Diphenhydramine Other (See Comments)    seizure   Diphenhydramine Hcl     Other reaction(s): Other (See Comments)   Diphenylpyraline Other (See Comments)   Doxycycline Itching, Swelling and Other (See Comments)    Facial   Doxycycline Itching and Swelling   Hydrocodone Other (See Comments)    Migraine   Hydromorphone Other (See Comments)    Migraine   Meloxicam Other (See Comments)   Mobic [Meloxicam] Other (See Comments)    reflux   Nsaids    Nsaids Other (See Comments)    Pt on blood thinner   Oxycodone Other (See Comments)    Migraine   Propofol Other (See Comments)   Propofol Other (See Comments)    Very sensitive;  patient stated she was told she was told she had apnea   Family History  Problem Relation Age of Onset   Hypertension Mother    Arrhythmia Mother    Heart failure Mother    Arrhythmia Brother    Stroke Brother 46       cerebral hemorrhage, nonsmoker, no HTN   Prostate cancer Brother    Stroke Father        from an aneurysm   Stroke Maternal Aunt 83       cerebral hemorrhage   Liver cancer Maternal Grandmother    Atrial fibrillation Son    Celiac disease Son    Heart attack Neg Hx    Breast cancer Neg Hx    Family history: Family history reviewed and not pertinent.  Prior to Admission medications  Medication Sig Start Date End Date Taking? Authorizing Provider  acetaminophen (TYLENOL) 325 MG tablet Take 650 mg by mouth every 4 (four) hours as needed.    [provider]  apixaban (ELIQUIS) 5 MG TABS tablet Take 5 mg by mouth 2 (two) times daily.    [provider]  ARIPiprazole (ABILIFY) 10 MG tablet Take 10 mg by mouth daily.    [provider]  ARIPiprazole (ABILIFY) 5 MG tablet Take 5 mg by mouth daily. For 2 days    [provider]  brivaracetam (BRIVIACT) 25 MG TABS tablet Take 25 mg by mouth daily at 12 noon.    [provider]  furosemide (LASIX) 40 MG tablet Take 40 mg by mouth. As needed for overnight weight gain of 3lbs or 5lbs/week.    [provider]  gabapentin (NEURONTIN) 100 MG capsule Take 2 capsules by mouth 3 (three) times daily. 05/07/23   [provider]  lamoTRIgine (LAMICTAL) 150 MG tablet Take 150 mg by mouth daily.    [provider]  lamoTRIgine (LAMICTAL) 200 MG tablet Take 200 mg by mouth at bedtime. 10/18/21   [provider]  metaxalone (SKELAXIN) 800 MG tablet Take 800 mg by mouth 3 (three) times daily as needed for muscle spasms.    [provider]  metoprolol tartrate (LOPRESSOR) 25 MG tablet Take 25 mg by mouth daily.    [provider]  midodrine  (PROAMATINE) 2.5 MG tablet Take 2.5 mg by mouth 3 (three) times daily with meals. And 1 tablet as needed for low blood pressure    [provider]  montelukast (SINGULAIR) 10 MG tablet TAKE ONE TABLET BY MOUTH AT BEDTIME 01/09/23   Sherlene Shams, MD  polyethylene glycol powder (GLYCOLAX/MIRALAX) 17 GM/SCOOP powder Take 1 Dose by mouth daily. 05/24/19   [provider]  rosuvastatin (CRESTOR) 40 MG tablet Take 1 tablet (40 mg total) by mouth daily. 02/03/23   Sherlene Shams, MD  VENTOLIN HFA 108 (90 Base) MCG/ACT inhaler INHALE 2 PUFFS INTO THE LUNGS EVERY 6 HOURS AS NEEDED FOR WHEEZING OR SHORTNESS OF BREATH 10/07/22   Sherlene Shams, MD   Physical Exam: Vitals:   05/17/23 1400 05/17/23 1745 05/17/23 1800 05/17/23 1900  BP: 111/74 120/81 137/83 117/86  Pulse: 73 80 76 81  Resp: 20 18 17 17   Temp:      TempSrc:      SpO2: 100% 96% 92% 96%  Weight:      Height:       Constitutional: appears frail, NAD, calm Eyes: PERRL, lids and conjunctivae normal ENMT: Mucous membranes are moist. Posterior pharynx clear of any exudate or lesions. Age-appropriate dentition. Hearing appropriate Neck: normal, supple, no masses, no thyromegaly Respiratory: clear to auscultation bilaterally, no wheezing, no crackles. Normal respiratory effort. No accessory muscle use.  Cardiovascular: Regular rate and rhythm, no murmurs / rubs / gallops. No extremity edema. 2+ pedal pulses. No carotid bruits.  Abdomen: Suprapubic tenderness, no masses palpated, no hepatosplenomegaly. Bowel sounds positive.  Musculoskeletal: no clubbing / cyanosis. No joint deformity upper and lower extremities. Good ROM, no contractures, no atrophy. Normal muscle tone.  Skin: no rashes, lesions, ulcers. No induration Neurologic: Sensation intact. Strength 5/5 in all 4.  Psychiatric: Normal judgment and insight. Alert and oriented x 3.  Depressed mood.   EKG: independently reviewed, showing sinus rhythm with rate of 76, QTc  558  Chest x-ray on Admission: I personally reviewed and I agree with  radiologist reading as below.  CT ABDOMEN PELVIS W CONTRAST  Result Date: 05/17/2023 CLINICAL DATA:  82 year old female with abdominal pain, falls, bruising to the abdomen and back. EXAM: CT ABDOMEN AND PELVIS WITH CONTRAST TECHNIQUE: Multidetector CT imaging of the abdomen and pelvis was performed using the standard protocol following bolus administration of intravenous contrast. RADIATION DOSE REDUCTION: This exam was performed according to the departmental dose-optimization program which includes automated exposure control, adjustment of the mA and/or kV according to patient size and/or use of iterative reconstruction technique. CONTRAST:  OMNIPAQUE IOHEXOL 300 MG/ML  SOLN COMPARISON:  CT Abdomen and Pelvis 04/23/2023. FINDINGS: Lower chest: Partially visible cardiomegaly appears stable. No pericardial or pleural effusion. Partially visible cardiac pacer lead. Hepatobiliary: Numerous chronic hepatic cysts have not significantly changed since 2017 and are benign (no follow-up imaging recommended). Diminutive, negative gallbladder. Pancreas: Within normal limits. Spleen: Negative. Adrenals/Urinary Tract: Normal adrenal glands. Nonobstructed kidneys. Occasional chronic renal cysts also present in 2017 (no follow-up imaging recommended). Symmetric renal enhancement and contrast excretion with no hydronephrosis or hydroureter. But the urinary bladder is markedly distended today (sagittal image 63). Estimated bladder volume 920 mL. Stomach/Bowel: No dilated large or small bowel. Retained stool throughout the colon. Normal appendix on series 2, image 43. Decompressed stomach. No free air or free fluid. Vascular/Lymphatic: Extensive Aortoiliac calcified atherosclerosis. Major arterial structures remain patent. No lymphadenopathy. Early portal venous timing on the initial phase, on the delayed images the main portal venous structures appear  to be patent. Reproductive: Negative. Other: No pelvic free fluid. Musculoskeletal: Moderate to severe dextroconvex lumbar scoliosis and widespread lumbar, lower thoracic spine degeneration. Chronic lower lumbar spondylolisthesis. Chronic right posterior rib fractures appear stable. But there is a non healed transverse fracture of the sacrum through the S3 level (sagittal images 56 through 61) which is now minimally displaced, but likely subacute. Superimposed bilateral sacral ala insufficiency fractures with sclerosis, stable. SI joints remain intact. Chronic fractures of the right pubic rami, bordering the anterior right acetabulum again noted. Previous left femur ORIF. IMPRESSION: 1. A transverse fracture through the Sacrum at S3 is mildly displaced, but appears subacute, and is superimposed on multiple chronic sacral and pelvic fractures. Query associated S3 to S5 sacral radiculopathy. 2. Marked urinary bladder distension (920 mL). Query urinary retention. 3. No other acute or inflammatory process identified in the abdomen or pelvis. Aortic Atherosclerosis (ICD10-I70.0). Electronically Signed   By: Odessa Fleming M.D.   On: 05/17/2023 13:04    Labs on Admission: I have personally reviewed following labs  CBC: Recent Labs  Lab 05/17/23 1113  WBC 5.3  HGB 8.8*  HCT 28.3*  MCV 96.3  PLT 126*   Basic Metabolic Panel: Recent Labs  Lab 05/17/23 1201  NA 136  K 3.7  CL 100  CO2 26  GLUCOSE 100*  BUN 28*  CREATININE 1.13*  CALCIUM 9.2   GFR: Estimated Creatinine Clearance: 34.5 mL/min (A) (by C-G formula based on SCr of 1.13 mg/dL (H)).  Liver Function Tests: Recent Labs  Lab 05/17/23 1201  AST 47*  ALT 26  ALKPHOS 123  BILITOT 1.5*  PROT 6.8  ALBUMIN 4.3   Urine analysis:    Component Value Date/Time   COLORURINE AMBER (A) 05/17/2023 1353   APPEARANCEUR CLOUDY (A) 05/17/2023 1353   LABSPEC 1.019 05/17/2023 1353   PHURINE 6.0 05/17/2023 1353   GLUCOSEU NEGATIVE 05/17/2023 1353    GLUCOSEU NEGATIVE 04/07/2018 1647   HGBUR MODERATE (A) 05/17/2023 1353   BILIRUBINUR NEGATIVE  05/17/2023 1353   KETONESUR NEGATIVE 05/17/2023 1353   PROTEINUR 30 (A) 05/17/2023 1353   UROBILINOGEN 0.2 04/07/2018 1647   NITRITE NEGATIVE 05/17/2023 1353   LEUKOCYTESUR SMALL (A) 05/17/2023 1353   This document was prepared using Dragon Voice Recognition software and may include unintentional dictation errors.  Dr. Sedalia Muta [facility]  If 7PM-7AM, please contact overnight-coverage provider If 7AM-7PM, please contact day attending provider [email or web address]  05/17/2023, 7:30 PM

## 2023-05-17 NOTE — ED Notes (Signed)
This RN and bill, rn to bedside to insert foley catheter. Pt. Had urine output.

## 2023-05-17 NOTE — ED Notes (Signed)
At bedside with MD Mumma during rectal exam. Patient tolerated well

## 2023-05-17 NOTE — Assessment & Plan Note (Addendum)
Clear liquid on admission,.  Stable at this time Status post protonix 80 mg IV once per EDP We will continue protonix gtt Gastroenterology has been consulted via Epic order and Staff message to Dr. Allegra Lai Admit to telemetry cardiac, inpatient

## 2023-05-17 NOTE — Hospital Course (Signed)
[name] is an 82 year old female with paroxysmal atrial fibrillation, on Eliquis, GERD, who presents to the emergency department for chief concerns of coffee-ground emesis, distended abdomen. Hemoglobin is low, but stable, placed on Protonix.  6/17: EGD today, had bilious vomit earlier

## 2023-05-17 NOTE — Assessment & Plan Note (Signed)
Holding home Eliquis on admission

## 2023-05-17 NOTE — ED Provider Notes (Signed)
[facility] Provider Note    Event Date/Time   First MD Initiated Contact with Patient 05/17/23 1139     (approximate)   History   Hematemesis and GI Problem   HPI  [name] [name] is a 82 y.o. female past medical history significant for paroxysmal atrial fibrillation on Eliquis, focal seizures, who presents to the emergency department with concern for GI bleed.  Patient is at a facility and stated that after breakfast she had multiple episodes of vomiting that appeared to be coffee-ground.  Multiple falls recently.  Patient is on anticoagulation on chart review with Eliquis.  Multiple frequent falls and ongoing altered mental status.  Patient son states recent urinary tract infection.  Frequent falls but only been occurring over the past 5 weeks.     Physical Exam   Triage Vital Signs: ED Triage Vitals  Enc Vitals Group     BP 05/17/23 1103 110/73     Pulse Rate 05/17/23 1103 72     Resp 05/17/23 1103 18     Temp 05/17/23 1103 99.2 F (37.3 C)     Temp Source 05/17/23 1103 Oral     SpO2 05/17/23 1103 99 %     Weight 05/17/23 1104 136 lb 11 oz (62 kg)     Height 05/17/23 1104 5\' 5"  (1.651 m)     Head Circumference --      Peak Flow --      Pain Score 05/17/23 1104 3     Pain Loc --      Pain Edu? --      Excl. in GC? --     Most recent vital signs: Vitals:   05/17/23 1330 05/17/23 1400  BP: 118/71 111/74  Pulse: 72 73  Resp: 16 20  Temp:    SpO2: 98% 100%    Physical Exam Constitutional:      Appearance: She is well-developed.  HENT:     Head: Atraumatic.  Eyes:     Conjunctiva/sclera: Conjunctivae normal.  Cardiovascular:     Rate and Rhythm: Regular rhythm.  Pulmonary:     Effort: No respiratory distress.  Abdominal:     General: There is no distension.     Tenderness: There is abdominal tenderness (Diffuse).  Genitourinary:    Comments: No gross blood or melena Musculoskeletal:        General: Normal range of motion.      Cervical back: Normal range of motion.  Skin:    General: Skin is warm.     Findings: Bruising (To the right flank, bilateral lower extremities that is worse to the left lower extremity) present.  Neurological:     Mental Status: She is alert. Mental status is at baseline.     IMPRESSION / MDM / ASSESSMENT AND PLAN / ED COURSE  I reviewed the triage vital signs and the nursing notes.  Differential diagnosis including upper GI bleed, gastritis/PUD, small bowel obstruction.  Plan for CT scan.  Patient typed and screened.  EKG  I, Corena Herter, the attending physician, personally viewed and interpreted this ECG.  Paced rhythm.  Negative Sgarbossa's criteria.  No significant change when compared to prior EKG, heart rate 76.  Wide-complex with QRS of 167  No tachycardic or bradycardic dysrhythmias while on cardiac telemetry.  RADIOLOGY I independently reviewed imaging, my interpretation of imaging: CT abdomen and pelvis with contrast large bladder.  Patient with urinary retention.  Foley catheter was placed in order to drain  the bladder.  Sniffing your urine output.  LABS (all labs ordered are listed, but only abnormal results are displayed) Labs interpreted as -    Labs Reviewed  CBC - Abnormal; Notable for the following components:      Result Value   RBC 2.94 (*)    Hemoglobin 8.8 (*)    HCT 28.3 (*)    RDW 17.5 (*)    Platelets 126 (*)    All other components within normal limits  COMPREHENSIVE METABOLIC PANEL - Abnormal; Notable for the following components:   Glucose, Bld 100 (*)    BUN 28 (*)    Creatinine, Ser 1.13 (*)    AST 47 (*)    Total Bilirubin 1.5 (*)    GFR, Estimated 49 (*)    All other components within normal limits  URINALYSIS, W/ REFLEX TO CULTURE (INFECTION SUSPECTED) - Abnormal; Notable for the following components:   Color, Urine AMBER (*)    APPearance CLOUDY (*)    Hgb urine dipstick MODERATE (*)    Protein, ur 30 (*)    Leukocytes,Ua  SMALL (*)    Bacteria, UA FEW (*)    All other components within normal limits  URINE CULTURE  CULTURE, BLOOD (ROUTINE X 2)  CULTURE, BLOOD (ROUTINE X 2)  LACTIC ACID, PLASMA  LACTIC ACID, PLASMA  POC OCCULT BLOOD, ED  TYPE AND SCREEN  TYPE AND SCREEN     MDM    CT abdomen and pelvis with contrast shows no signs of abdominal pathology.  No obvious source for GI bleed.  Transverse fracture through S3.  Patient has had multiple falls over the past week.  Markedly enlarged bladder distention.  Concern for significant upper GI bleed.  Hemoglobin stable at 8.8.  Chronically low platelets.  Patient was given IV Protonix.  Type and screened.  Will hold on blood transfusion at this time.  Unable to urinate, Foley catheter placed and will reevaluate.  Sent a message to hospital team about urinary retention.  Given her fracture may need an MRI versus secondary to urinary tract infection.  Given findings of urinary tract infection blood cultures and a lactic acid added on.  Urine culture obtained.  Given a dose of IV Rocephin.  Consulted hospitalist for admission.   PROCEDURES:  Critical Care performed: No  Procedures  Patient's presentation is most consistent with acute presentation with potential threat to life or bodily function.   MEDICATIONS ORDERED IN ED: Medications  acetaminophen (TYLENOL) tablet 650 mg (has no administration in time range)    Or  acetaminophen (TYLENOL) suppository 650 mg (has no administration in time range)  ondansetron (ZOFRAN) tablet 4 mg (has no administration in time range)    Or  ondansetron (ZOFRAN) injection 4 mg (has no administration in time range)  senna-docusate (Senokot-S) tablet 1 tablet (has no administration in time range)  pantoprozole (PROTONIX) 80 mg /NS 100 mL infusion (has no administration in time range)  pantoprazole (PROTONIX) injection 40 mg (has no administration in time range)  lamoTRIgine (LAMICTAL) tablet 150 mg (has no  administration in time range)  lamoTRIgine (LAMICTAL) tablet 200 mg (has no administration in time range)  cefTRIAXone (ROCEPHIN) 1 g in sodium chloride 0.9 % 100 mL IVPB (has no administration in time range)  pantoprazole (PROTONIX) 80 mg /NS 100 mL IVPB (0 mg Intravenous Stopped 05/17/23 1314)  iohexol (OMNIPAQUE) 300 MG/ML solution 100 mL (100 mLs Intravenous Contrast Given 05/17/23 1241)    FINAL CLINICAL IMPRESSION(S) /  ED DIAGNOSES   Final diagnoses:  Hematemesis, unspecified whether nausea present  Urinary retention  Acute cystitis with hematuria     Rx / DC Orders   ED Discharge Orders     None        Note:  This document was prepared using Dragon voice recognition software and may include unintentional dictation errors.   Corena Herter, MD 05/17/23 1428

## 2023-05-17 NOTE — ED Triage Notes (Signed)
BIB ems per patient abd pain, diarrhea and vomiting coffee grounds since yesterday.

## 2023-05-17 NOTE — ED Triage Notes (Signed)
Staff reports patient ate this AM and now having Distended, hard abdomen with coffee ground emesis.   EMS administered 4mg  zofran  EMS vitals: 100/60 94% RA

## 2023-05-17 NOTE — Assessment & Plan Note (Signed)
Per outpatient neurology note on 04/24/2023: Continue Lamotrigine 150 mg by mouth every morning + 200 mg by mouth every night  Lamotrigine has been continued per above

## 2023-05-17 NOTE — Assessment & Plan Note (Signed)
At baseline 

## 2023-05-17 NOTE — ED Notes (Addendum)
Pt. Has begun yelling from room for help, appears to have increasing confusion. Pt. Has intermittent lucidity and orientation. Pt. Appears to be having visual hallucinations, speaking about holding hand with a woman in her ED room when no one is present. Dr. Sedalia Muta notified pt. Is pulling at IV's, monitoring equipment, and foley. See orders. Bed locked and low, call light in reach, belongings within reach, bed alarm on.

## 2023-05-17 NOTE — ED Notes (Signed)
Pt upset--she's in bed, holding her head, not understanding the need for admission.  She demands to speak with the doctor, and desires a discharge.   Admitting doctor to see and will discuss

## 2023-05-17 NOTE — Significant Event (Addendum)
Received message from nursing. Patient is increasingly agitated, frustrated, yelling out, pulling at IV lines and Foley.  I evaluated patient at bedside, patient is tearful and refuses to answer me her name, age, location.  She states that I asked her this 3 hours ago.  At bedside patient was afebrile, respiration rate of 17, heart rate of 81, SpO2 was 92% on room air.  Patient was tearful.  She states that 'I refuse to play the game of life in [place] and I am going to move.'   I asked her where she is moving to if she refuses to answer.  She becomes tearful states that 'Me and my husband started out in a trailer and that they have helped the 2 kids with a lot of money out there'. She points to outside the door.  I then asked her: 'Mr. Kizzee, can you tell me where you are right now?  She states that I am not going to tell you I really know where I am I am not confused.  # Altered mental status with agitation Lorazepam 1 mg IV every 4 hours as needed for anxiety, 1 day of coverage ordered Haldol 2 mg p.o./IV every 6 hours as needed for agitation, 15 hours of coverage ordered  # Pyuria, present on admission Given that patient has small leukocytes with altered mental status, we will initiate with ceftriaxone 1 g IV daily, to complete 5-day course Blood cultures are in process  Dr. Sedalia Muta

## 2023-05-18 ENCOUNTER — Inpatient Hospital Stay: Payer: Medicare Other

## 2023-05-18 DIAGNOSIS — K922 Gastrointestinal hemorrhage, unspecified: Secondary | ICD-10-CM | POA: Diagnosis not present

## 2023-05-18 DIAGNOSIS — K920 Hematemesis: Secondary | ICD-10-CM | POA: Diagnosis not present

## 2023-05-18 DIAGNOSIS — I480 Paroxysmal atrial fibrillation: Secondary | ICD-10-CM

## 2023-05-18 DIAGNOSIS — N1831 Chronic kidney disease, stage 3a: Secondary | ICD-10-CM | POA: Diagnosis not present

## 2023-05-18 DIAGNOSIS — D509 Iron deficiency anemia, unspecified: Secondary | ICD-10-CM | POA: Diagnosis not present

## 2023-05-18 DIAGNOSIS — D696 Thrombocytopenia, unspecified: Secondary | ICD-10-CM

## 2023-05-18 DIAGNOSIS — F0390 Unspecified dementia without behavioral disturbance: Secondary | ICD-10-CM | POA: Diagnosis not present

## 2023-05-18 DIAGNOSIS — D631 Anemia in chronic kidney disease: Secondary | ICD-10-CM

## 2023-05-18 DIAGNOSIS — R440 Auditory hallucinations: Secondary | ICD-10-CM | POA: Insufficient documentation

## 2023-05-18 LAB — CBC
HCT: 25.7 % — ABNORMAL LOW (ref 36.0–46.0)
Hemoglobin: 8.1 g/dL — ABNORMAL LOW (ref 12.0–15.0)
MCH: 29.9 pg (ref 26.0–34.0)
MCHC: 31.5 g/dL (ref 30.0–36.0)
MCV: 94.8 fL (ref 80.0–100.0)
Platelets: 116 10*3/uL — ABNORMAL LOW (ref 150–400)
RBC: 2.71 MIL/uL — ABNORMAL LOW (ref 3.87–5.11)
RDW: 17.2 % — ABNORMAL HIGH (ref 11.5–15.5)
WBC: 4.7 10*3/uL (ref 4.0–10.5)
nRBC: 0 % (ref 0.0–0.2)

## 2023-05-18 LAB — URINE CULTURE: Culture: >=100000 — AB

## 2023-05-18 LAB — BASIC METABOLIC PANEL
Anion gap: 9 (ref 5–15)
BUN: 23 mg/dL (ref 8–23)
CO2: 25 mmol/L (ref 22–32)
Calcium: 8.7 mg/dL — ABNORMAL LOW (ref 8.9–10.3)
Chloride: 103 mmol/L (ref 98–111)
Creatinine, Ser: 0.94 mg/dL (ref 0.44–1.00)
GFR, Estimated: >60 mL/min (ref >60)
Glucose, Bld: 81 mg/dL (ref 70–99)
Potassium: 3.6 mmol/L (ref 3.5–5.1)
Sodium: 137 mmol/L (ref 135–145)

## 2023-05-18 LAB — GLUCOSE, CAPILLARY: Glucose-Capillary: 116 mg/dL — ABNORMAL HIGH (ref 70–99)

## 2023-05-18 LAB — FOLATE: Folate: 25 ng/mL (ref >5.9)

## 2023-05-18 LAB — CULTURE, BLOOD (ROUTINE X 2): Special Requests: ADEQUATE

## 2023-05-18 MED ORDER — QUETIAPINE FUMARATE 25 MG PO TABS
25.0000 mg | ORAL_TABLET | Freq: Every day | ORAL | Status: DC
  Administered 2023-05-18 – 2023-05-19 (×2): 25 mg via ORAL
  Filled 2023-05-18 (×2): qty 1

## 2023-05-18 MED ORDER — SODIUM CHLORIDE 0.9 % IV SOLN
300.0000 mg | Freq: Once | INTRAVENOUS | Status: AC
  Administered 2023-05-18: 300 mg via INTRAVENOUS
  Filled 2023-05-18: qty 300

## 2023-05-18 MED ORDER — CHLORHEXIDINE GLUCONATE CLOTH 2 % EX PADS
6.0000 | MEDICATED_PAD | Freq: Every day | CUTANEOUS | Status: DC
  Administered 2023-05-18 – 2023-05-20 (×3): 6 via TOPICAL

## 2023-05-18 NOTE — TOC Initial Note (Addendum)
Transition of Care [facility]) - Initial/Assessment Note    Patient Details  Name: [name] [name] MRN: [id number] Date of Birth: 08/03/1941  Transition of Care [facility] CM/SW Contact:    Liliana Cline, LCSW Phone Number: 05/18/2023, 9:41 AM  Clinical Narrative:                 Per chart review, patient is from [facility] ILF. Was recently DC to their Memory Care Unit due to needing SNF and no SNF availability. CSW reached out to [name] at [facility] to verify if patient had returned to ILF prior to this admission, awaiting response.   10:15- Per [name] at [facility], patient decided last week to stay at their Memory Care Unit long term. Patient to return to Memory Care at DC.         Patient Goals and CMS Choice            Expected Discharge Plan and Services       Living arrangements for the past 2 months: Independent Living Facility                                      Prior Living Arrangements/Services Living arrangements for the past 2 months: Independent Living Facility Lives with:: Facility Resident                   Activities of Daily Living Home Assistive Devices/Equipment: Eyeglasses, Environmental consultant (specify type) (Rolling walker) ADL Screening (condition at time of admission) Patient's cognitive ability adequate to safely complete daily activities?: No Is the patient deaf or have difficulty hearing?: No Does the patient have difficulty seeing, even when wearing glasses/contacts?: No Does the patient have difficulty concentrating, remembering, or making decisions?: Yes Patient able to express need for assistance with ADLs?: Yes Does the patient have difficulty dressing or bathing?: Yes Independently performs ADLs?: No Communication: Independent Dressing (OT): Needs assistance Is this a change from baseline?: Pre-admission baseline Does the patient have difficulty walking or climbing stairs?: Yes Weakness of Legs: Both Weakness of Arms/Hands:  Both  Permission Sought/Granted                  Emotional Assessment              Admission diagnosis:  Urinary retention [R33.9] Upper GI bleed [K92.2] Acute cystitis with hematuria [N30.01] Hematemesis, unspecified whether nausea present [K92.0] Patient Active Problem List   Diagnosis Date Noted   Upper GI bleed 05/17/2023   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   Altered mental status 04/23/2023   IDA (iron deficiency anemia) 04/03/2023   CAP (community acquired pneumonia) 01/26/2023   Sepsis due to pneumonia (HCC) 01/26/2023   Polypharmacy 08/28/2022   Exposure to mold 08/28/2022   Candidiasis of anus 08/28/2022   Right groin pain 06/26/2022   Lumbar radiculitis 05/09/2022   Right sided sciatica 04/10/2022   Anemia of chronic disease 04/02/2022   DDD (degenerative disc disease), lumbar 03/31/2022   Spondylosis 03/31/2022   At high risk for falls 03/31/2022   Chronic kidney disease (CKD) stage G3a/A2, moderately decreased glomerular filtration rate (GFR) between 45-59 mL/min/1.73 square meter and albuminuria creatinine ratio between 30-299 mg/g (HCC) 11/13/2021   Do not resuscitate status 08/14/2021   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   Elevated serum lactate dehydrogenase (LDH) 08/01/2021   PAF (paroxysmal atrial fibrillation) (HCC) 06/19/2021   Osteoporosis  06/19/2021   Seizure disorder (HCC) 06/19/2021   Hyperlipidemia 06/19/2021   Concern about end of life 04/24/2021   Chronic bilateral low back pain without sciatica 04/12/2021   Fall as cause of accidental injury at home as place of occurrence 04/12/2021   Acquired thrombophilia (HCC) 06/14/2020   Carotid stenosis 04/21/2020   Inguinal hernia of right side without obstruction or gangrene 02/09/2020   Sinus node dysfunction (HCC) 07/02/2019   Intracranial atherosclerosis 10/06/2018   Focal seizures (HCC) 05/14/2018   Arthritis of both hands 12/14/2017   Allergy to  environmental factors 08/27/2017   Anemia in chronic kidney disease 02/17/2017   Visit for preventive health examination 05/23/2016   Hepatic cyst 05/04/2016   Chronic systolic congestive heart failure (HCC) 05/03/2016   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   H/O mitral valve replacement 10/31/2015   S/P Minimally invasive maze operation for atrial fibrillation 10/31/2015   Atrial fibrillation, persistent (HCC)    Restrictive lung disease 09/12/2015   Pulmonary hypertension (HCC) 08/08/2015   Nodule of right lung 08/04/2015   Thoracic aorta atherosclerosis (HCC) 02/05/2015   Family history of cerebral aneurysm 08/02/2014   Diverticulosis of colon without hemorrhage 01/09/2014   Hospital discharge follow-up 04/15/2013   Thrombocytopenia (HCC) 02/04/2013   IBS (irritable bowel syndrome) 12/31/2012   History of asbestos exposure 06/27/2012   Cataract extraction status of right eye 06/25/2012   RBBB 05/05/2012   Mitral valve prolapse 05/02/2011   PCP:  Sherlene Shams, MD Pharmacy:   [facility] [place], [place] - [name] [place] [address] Phone: [phone] Fax: [phone]     Social Determinants of Health (SDOH) Social History: SDOH Screenings   Food Insecurity: No Food Insecurity (05/17/2023)  Housing: Patient Declined (05/17/2023)  Recent Concern: Housing - Medium Risk (03/17/2023)  Transportation Needs: No Transportation Needs (05/17/2023)  Utilities: Not At Risk (05/17/2023)  Depression (PHQ2-9): High Risk (05/08/2023)  Financial Resource Strain: Low Risk  (03/17/2023)  Physical Activity: Unknown (03/17/2023)  Social Connections: Unknown (03/17/2023)  Stress: No Stress Concern Present (03/17/2023)  Tobacco Use: Low Risk  (05/17/2023)   SDOH Interventions:     Readmission Risk Interventions     No data to display

## 2023-05-18 NOTE — Progress Notes (Signed)
  Progress Note   Patient: [name] [name] GNF:[id number] DOB: 1941-08-03 DOA: 05/17/2023     1 DOS: the patient was seen and examined on 05/18/2023   Brief hospital course: [name] is an 82 year old female with paroxysmal atrial fibrillation, on Eliquis, GERD, who presents to the emergency department for chief concerns of coffee-ground emesis, distended abdomen. Hemoglobin is low, but stable, placed on Protonix.   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 Patient had coffee-ground emesis at time of admission.  She is treated with Protonix.  Discussed with Dr. Allegra Lai, patient not doing to EGD today due to Eliquis use.  Hemoglobin has been low, but appears to be chronic.  B12 and iron study recently has been adequate. Continue to monitor hemoglobin.  Psychosis with auditory hallucination. Attempt to reach family without success.  Patient has been hearing multiple voices which is unusual for delirium in elderly. I will obtain CT head to rule out any possibility of intracranial hemorrhage. I will start Seroquel every evening.  So far, patient has been cooperating. Patient has a small number of WBC in the urine, Rocephin was started in the ED, I will continue for now until urine culture is available.  It is a possibility that UTI has caused this.  Chronic kidney disease (CKD) stage G3a/A2, moderately  decreased glomerular filtration rate (GFR) between 45-59 mL/min/1.73 square meter and albuminuria creatinine ratio between 30-299 mg/g (HCC) At baseline  PAF (paroxysmal atrial fibrillation) (HCC) Holding home Eliquis on admission  Focal seizures (HCC) Continue home medicines.    Subjective:  Patient appears to be delirium, she does not answer my questions, only tells me that she knows everything as her voice has been telling her.  Physical Exam: Vitals:   05/17/23 1900 05/17/23 2059 05/18/23 0416 05/18/23 0825  BP: 117/86 130/85 128/81 114/64  Pulse: 81 93 84 73  Resp: 17 20 18 16   Temp:  97.6 F (36.4 C) 98.1 F (36.7 C) 98.5 F (36.9 C)  TempSrc:  Axillary Axillary Oral  SpO2: 96% 99%  96%  Weight:      Height:       General exam: Appears calm and comfortable  Respiratory system: Clear to auscultation. Respiratory effort normal. Cardiovascular system: S1 & S2 heard, RRR. No JVD, murmurs, rubs, gallops or clicks. No pedal edema. Gastrointestinal system: Abdomen is nondistended, soft and nontender. No organomegaly or masses felt. Normal bowel sounds heard. Central nervous system: Alert and oriented x1. No focal neurological deficits. Extremities: Symmetric 5 x 5 power. Skin: No rashes, lesions or ulcers Psychiatry: Flat affect    Data Reviewed:  Review lab results.  Family Communication: Could not reach patient's son.  Disposition: Status is: Inpatient Remains inpatient appropriate because: Severity of disease, IV treatment.     Time spent: 35 minutes  Author: Marrion Coy, MD 05/18/2023 12:22 PM  For on call review [email or web address].

## 2023-05-18 NOTE — Progress Notes (Signed)
Eulogio Bear (Patient's POA) consents (verbal consent through telephone) for patient to have EGD tomorrow. Donald Pore RN is the second RN that witnessed consent.

## 2023-05-18 NOTE — [place] FL2 (Signed)
[place] MEDICAID FL2 LEVEL OF CARE FORM     IDENTIFICATION  Patient Name: [name] [name] Birthdate: Aug 01, 1941 Sex: female Admission Date (Current Location): 05/17/2023  [facility] and [place] Number:  Chiropodist and Address:  [facility], [address], [place], [place] [address]      Provider Number: [id number]  Attending Physician Name and Address:  Marrion Coy, MD  Relative Name and Phone Number:  Desy[place], [place]) [phone] [facility])    Current Level of Care: Hospital Recommended Level of Care: Memory Care Prior Approval Number:    Date Approved/Denied:   PASRR Number: [id number]  Discharge Plan:      Current Diagnoses: Patient Active Problem List   Diagnosis Date Noted   Upper GI bleed 05/17/2023   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   Altered mental status 04/23/2023   IDA (iron deficiency anemia) 04/03/2023   CAP (community acquired pneumonia) 01/26/2023   Sepsis due to pneumonia (HCC) 01/26/2023   Polypharmacy 08/28/2022   Exposure to mold 08/28/2022   Candidiasis of anus 08/28/2022   Right groin pain 06/26/2022   Lumbar radiculitis 05/09/2022   Right sided sciatica 04/10/2022   Anemia of chronic disease 04/02/2022   DDD (degenerative disc disease), lumbar 03/31/2022   Spondylosis 03/31/2022   At high risk for falls 03/31/2022   Chronic kidney disease (CKD) stage G3a/A2, moderately decreased glomerular filtration rate (GFR) between 45-59 mL/min/1.73 square meter and albuminuria creatinine ratio between 30-299 mg/g (HCC) 11/13/2021   Do not resuscitate status 08/14/2021   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   Elevated serum lactate dehydrogenase (LDH) 08/01/2021   PAF (paroxysmal atrial fibrillation) (HCC) 06/19/2021   Osteoporosis 06/19/2021   Seizure disorder (HCC) 06/19/2021   Hyperlipidemia 06/19/2021   Concern about end of life 04/24/2021   Chronic bilateral low back  pain without sciatica 04/12/2021   Fall as cause of accidental injury at home as place of occurrence 04/12/2021   Acquired thrombophilia (HCC) 06/14/2020   Carotid stenosis 04/21/2020   Inguinal hernia of right side without obstruction or gangrene 02/09/2020   Sinus node dysfunction (HCC) 07/02/2019   Intracranial atherosclerosis 10/06/2018   Focal seizures (HCC) 05/14/2018   Arthritis of both hands 12/14/2017   Allergy to environmental factors 08/27/2017   Anemia in chronic kidney disease 02/17/2017   Visit for preventive health examination 05/23/2016   Hepatic cyst 05/04/2016   Chronic systolic congestive heart failure (HCC) 05/03/2016   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   H/O mitral valve replacement 10/31/2015   S/P Minimally invasive maze operation for atrial fibrillation 10/31/2015   Atrial fibrillation, persistent (HCC)    Restrictive lung disease 09/12/2015   Pulmonary hypertension (HCC) 08/08/2015   Nodule of right lung 08/04/2015   Thoracic aorta atherosclerosis (HCC) 02/05/2015   Family history of cerebral aneurysm 08/02/2014   Diverticulosis of colon without hemorrhage 01/09/2014   Hospital discharge follow-up 04/15/2013   Thrombocytopenia (HCC) 02/04/2013   IBS (irritable bowel syndrome) 12/31/2012   History of asbestos exposure 06/27/2012   Cataract extraction status of right eye 06/25/2012   RBBB 05/05/2012   Mitral valve prolapse 05/02/2011    Orientation RESPIRATION BLADDER Height & Weight     Self, Time, Situation, Place  O2 Incontinent, Indwelling catheter Weight: 136 lb 11 oz (62 kg) Height:  5\' 5"  (165.1 cm)  BEHAVIORAL SYMPTOMS/MOOD NEUROLOGICAL BOWEL NUTRITION STATUS        Diet  AMBULATORY STATUS COMMUNICATION OF NEEDS Skin   Limited  Assist Verbally Bruising                       Personal Care Assistance Level of Assistance  Bathing, Feeding, Dressing Bathing Assistance: Limited assistance Feeding assistance: Limited  assistance Dressing Assistance: Limited assistance     Functional Limitations Info             SPECIAL CARE FACTORS FREQUENCY                       Contractures      Additional Factors Info  Code Status, Allergies Code Status Info: DNR Allergies Info: Codeine, Epinephrine, Hydrocodone, Hydromorphone, Molds & Smuts, Oxycodone, Meloxicam, Codeine, Dextromethorphan, Diphen (Diphenhydramine Hcl), Diphenhydramine, Diphenhydramine Hcl, Diphenylpyraline, Doxycycline, Doxycycline, Hydrocodone, Hydromorphone, Meloxicam, Mobic (Meloxicam), Nsaids, Nsaids, Oxycodone, Propofol, Propofol           Current Medications (05/18/2023):  This is the current hospital active medication list Current Facility-Administered Medications  Medication Dose Route Frequency Provider Last Rate Last Admin   acetaminophen (TYLENOL) tablet 650 mg  650 mg Oral Q6H PRN Cox, Amy N, DO       Or   acetaminophen (TYLENOL) suppository 650 mg  650 mg Rectal Q6H PRN Cox, Amy N, DO       albuterol (PROVENTIL) (2.5 MG/3ML) 0.083% nebulizer solution 2.5 mg  2.5 mg Inhalation Q6H PRN Cox, Amy N, DO       ARIPiprazole (ABILIFY) tablet 10 mg  10 mg Oral QPM Cox, Amy N, DO   10 mg at 05/17/23 2354   cefTRIAXone (ROCEPHIN) 1 g in sodium chloride 0.9 % 100 mL IVPB  1 g Intravenous Q24H Cox, Amy N, DO       Chlorhexidine Gluconate Cloth 2 % PADS 6 each  6 each Topical Daily Marrion Coy, MD       gabapentin (NEURONTIN) capsule 200 mg  200 mg Oral TID Cox, Amy N, DO   200 mg at 05/17/23 2355   lamoTRIgine (LAMICTAL) tablet 150 mg  150 mg Oral q morning Cox, Amy N, DO       lamoTRIgine (LAMICTAL) tablet 200 mg  200 mg Oral QHS Cox, Amy N, DO   200 mg at 05/17/23 2356   LORazepam (ATIVAN) injection 1 mg  1 mg Intravenous Q4H PRN Cox, Amy N, DO       metaxalone (SKELAXIN) tablet 800 mg  800 mg Oral TID PRN Cox, Amy N, DO       metoprolol tartrate (LOPRESSOR) tablet 25 mg  25 mg Oral Daily Cox, Amy N, DO       midodrine  (PROAMATINE) tablet 2.5 mg  2.5 mg Oral TID WC Cox, Amy N, DO       montelukast (SINGULAIR) tablet 10 mg  10 mg Oral QHS Cox, Amy N, DO   10 mg at 05/17/23 2356   ondansetron (ZOFRAN) tablet 4 mg  4 mg Oral Q6H PRN Cox, Amy N, DO       Or   ondansetron (ZOFRAN) injection 4 mg  4 mg Intravenous Q6H PRN Cox, Amy N, DO       [START ON 05/21/2023] pantoprazole (PROTONIX) injection 40 mg  40 mg Intravenous Q12H Cox, Amy N, DO       pantoprozole (PROTONIX) 80 mg /NS 100 mL infusion  8 mg/hr Intravenous Continuous Cox, Amy N, DO 10 mL/hr at 05/18/23 0409 8 mg/hr at 05/18/23 0409   polyethylene glycol (MIRALAX / GLYCOLAX) packet 17  g  17 g Oral Daily PRN Cox, Amy N, DO       rosuvastatin (CRESTOR) tablet 40 mg  40 mg Oral QHS Cox, Amy N, DO   40 mg at 05/17/23 2355   senna-docusate (Senokot-S) tablet 1 tablet  1 tablet Oral QHS PRN Cox, Amy N, DO         Discharge Medications: Please see discharge summary for a list of discharge medications.  Relevant Imaging Results:  Relevant Lab Results:   Additional Information SS#: [id number]  Dmoni Fortson E Michael Ventresca, LCSW

## 2023-05-18 NOTE — Consult Note (Signed)
Arlyss Repress, MD [address]  [address]  [place], [place] [address]  Main: [phone]  Fax: [phone] Pager: [phone]   Consultation  Referring Provider:     No ref. provider found Primary Care Physician:  Sherlene Shams, MD Primary Gastroenterologist: Gentry Fitz        Reason for Consultation: Coffee-ground emesis  Date of Admission:  05/17/2023 Date of Consultation:  05/18/2023         HPI:   [name] [name] is a 82 y.o. female history of paroxysmal A-fib on Eliquis, CHF, mitral valve prolapse, severe MR s/p minimally invasive bioprosthetic mitral valve replacement, maze procedure, symptomatic bradycardia with sinus node dysfunction with heart block, s/p permanent pacemaker, pulmonary hypertension, no evidence of coronary artery disease, bilateral carotid stenosis, CKD who lives at [facility] presented with nausea, vomiting and coffee-ground emesis with epigastric pain and distention.  Patient had low-grade fever in the ER, labs revealed hemoglobin 8.8, platelets 126, BUN/creatinine at baseline.  Hemoglobin today is 8.1, at baseline around 9.  Lactic acid normal.  UA was abnormal, being treated for UTI.  Underwent CT abdomen and pelvis which did not reveal any acute intra-abdominal pathology other than S2 subacute fracture.  Started on pantoprazole drip and GI is consulted for further evaluation Patient denies any abdominal pain.  She was not coherent, was telling me that his family members were group of 8 will be coming to meet her and she could hear them coming from [place].  She is altered as reported in the chart  NSAIDs: None  Antiplts/Anticoagulants/Anti thrombotics: Eliquis for  A-fib  GI Procedures: Colonoscopy in 2015 Excellent prep, sigmoid diverticulosis Normal retroflexion in the rectum  Past Medical History:  Diagnosis Date   Acute on chronic diastolic (congestive) heart failure (HCC)    Allergy    See list   Anemia 2018   Anxiety     Occasionally take Xanax for sleep   Anxiety associated with depression    Prn alprazolam    Anxiety associated with depression    Arthritis    Hands, Back   Atrial fibrillation, persistent (HCC)    DCCV 08/22/2015   Bradycardia post-op bradycardia, pacer dependent   MDT PPM 11/06/15, Dr. Ladona Ridgel   Cataract    Left eye   Chronic kidney disease (CKD) stage G3a/A2, moderately decreased glomerular filtration rate (GFR) between 45-59 mL/min/1.73 square meter and albuminuria creatinine ratio between 30-299 mg/g (HCC) 11/13/2021   Closed bilateral fracture of pubic rami (HCC) 08/28/2022   Diverticulosis    Focal seizures (HCC)    Heart murmur    Hematuria, gross 04/09/2018   Hemorrhoid    Hepatic cyst    innumerable   History of asbestos exposure    Hyperlipidemia    IBS (irritable bowel syndrome)    Idiopathic thrombocytopenic purpura (ITP) (HCC)    Migraine    MVP (mitral valve prolapse)    Near syncope 01/26/2023   Nodule of right lung    Osteoporosis of forearm    RBBB    Restrictive lung disease    Mild on PFT & likely cardiac in etiology    Right sided sciatica 04/10/2022   S/P Minimally invasive maze operation for atrial fibrillation 10/31/2015   Complete bilateral atrial lesion set using cryothermy and bipolar radiofrequency ablation with clipping of LA appendage via right mini thoracotomy approach   S/P minimally invasive mitral valve replacement with bioprosthetic valve 10/31/2015   33 mm [employer]  Mitral bovine bioprosthetic tissue valve placed via right mini thoracotomy approach   Seizures (HCC)    left foot paralysis and left hand paralysis Dr. 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   SVT (supraventricular tachycardia)    Thoracic aorta atherosclerosis (HCC)    TIA (transient ischemic attack)    Visit for preventive health examination 05/23/2016    Past Surgical History:  Procedure Laterality Date   BREAST  EXCISIONAL BIOPSY Right Late 80s   Negative X2   CARDIAC CATHETERIZATION N/A 10/18/2015   Procedure: Right/Left Heart Cath and Coronary Angiography;  Surgeon: Tonny Bollman, MD;  Location: [facility] INVASIVE CV LAB;  Service: Cardiovascular;  Laterality: N/A;   CARDIOVERSION N/A 08/22/2015   Procedure: CARDIOVERSION;  Surgeon: Vesta Mixer, MD;  Location: [facility] ENDOSCOPY;  Service: Cardiovascular;  Laterality: N/A;   COLONOSCOPY  2003   EP IMPLANTABLE DEVICE N/A 11/06/2015   Procedure: Pacemaker Implant;  Surgeon: Marinus Maw, MD;  Location: [facility] INVASIVE CV LAB;  Service: Cardiovascular;  Laterality: N/A;   EP IMPLANTABLE DEVICE N/A 02/20/2016   Procedure: Lead Extraction;  Surgeon: Marinus Maw, MD;  Location: [facility] INVASIVE CV LAB;  Service: Cardiovascular;  Laterality: N/A;   EYE SURGERY Right 2013   FEMUR IM NAIL Left 06/18/2021   Procedure: INTRAMEDULLARY (IM) NAIL FEMORAL, OPEN REDUCTION INTERNAL FIXATION FEMUR RIGHT LITTLE FINGER PIP CLOSED REDUCTION;  Surgeon: Myrene Galas, MD;  Location: [facility] OR;  Service: Orthopedics;  Laterality: Left;   FOOT SURGERY     ~2007 right foot bunion   MANDIBLE FRACTURE SURGERY  03/26/2013   MINIMALLY INVASIVE MAZE PROCEDURE N/A 10/31/2015   Procedure: MINIMALLY INVASIVE MAZE PROCEDURE;  Surgeon: Purcell Nails, MD;  Location: [facility] OR;  Service: Open Heart Surgery;  Laterality: N/A;   MITRAL VALVE REPLACEMENT Right 10/31/2015   Procedure: MINIMALLY INVASIVE MITRAL VALVE (MV) REPLACEMENT;  Surgeon: Purcell Nails, MD;  Location: [facility] OR;  Service: Open Heart Surgery;  Laterality: Right;   PACEMAKER LEAD REMOVAL  02/20/2016   TEE WITH CARDIOVERSION     TEE WITHOUT CARDIOVERSION N/A 08/22/2015   Procedure: TRANSESOPHAGEAL ECHOCARDIOGRAM (TEE);  Surgeon: Vesta Mixer, MD;  Location[facility] ENDOSCOPY;  Service: Cardiovascular;  Laterality: N/A;   TEE WITHOUT CARDIOVERSION N/A 10/31/2015   Procedure: TRANSESOPHAGEAL ECHOCARDIOGRAM (TEE);  Surgeon: Purcell Nails,  MD;  Location: [facility] OR;  Service: Open Heart Surgery;  Laterality: N/A;   TUBAL LIGATION     VARICOSE VEIN SURGERY Right      Current Facility-Administered Medications:    acetaminophen (TYLENOL) tablet 650 mg, 650 mg, Oral, Q6H PRN **OR** acetaminophen (TYLENOL) suppository 650 mg, 650 mg, Rectal, Q6H PRN, Cox, Amy N, DO   albuterol (PROVENTIL) (2.5 MG/3ML) 0.083% nebulizer solution 2.5 mg, 2.5 mg, Inhalation, Q6H PRN, Cox, Amy N, DO   ARIPiprazole (ABILIFY) tablet 10 mg, 10 mg, Oral, QPM, Cox, Amy N, DO, 10 mg at 05/17/23 2354   cefTRIAXone (ROCEPHIN) 1 g in sodium chloride 0.9 % 100 mL IVPB, 1 g, Intravenous, Q24H, Cox, Amy N, DO   Chlorhexidine Gluconate Cloth 2 % PADS 6 each, 6 each, Topical, Daily, Marrion Coy, MD, 6 each at 05/18/23 1050   gabapentin (NEURONTIN) capsule 200 mg, 200 mg, Oral, TID, Cox, Amy N, DO, 200 mg at 05/18/23 1047   iron sucrose (VENOFER) 300 mg in sodium chloride 0.9 % 250 mL IVPB, 300 mg, Intravenous, Once, Kennetta Pavlovic, Loel Dubonnet, MD  lamoTRIgine (LAMICTAL) tablet 150 mg, 150 mg, Oral, q morning, Cox, Amy N, DO, 150 mg at 05/18/23 1043   lamoTRIgine (LAMICTAL) tablet 200 mg, 200 mg, Oral, QHS, Cox, Amy N, DO, 200 mg at 05/17/23 2356   metaxalone (SKELAXIN) tablet 800 mg, 800 mg, Oral, TID PRN, Cox, Amy N, DO   metoprolol tartrate (LOPRESSOR) tablet 25 mg, 25 mg, Oral, Daily, Cox, Amy N, DO, 25 mg at 05/18/23 1048   montelukast (SINGULAIR) tablet 10 mg, 10 mg, Oral, QHS, Cox, Amy N, DO, 10 mg at 05/17/23 2356   ondansetron (ZOFRAN) tablet 4 mg, 4 mg, Oral, Q6H PRN **OR** ondansetron (ZOFRAN) injection 4 mg, 4 mg, Intravenous, Q6H PRN, Cox, Amy N, DO   [START ON 05/21/2023] pantoprazole (PROTONIX) injection 40 mg, 40 mg, Intravenous, Q12H, Cox, Amy N, DO   pantoprozole (PROTONIX) 80 mg /NS 100 mL infusion, 8 mg/hr, Intravenous, Continuous, Cox, Amy N, DO, Last Rate: 10 mL/hr at 05/18/23 0409, 8 mg/hr at 05/18/23 0409   polyethylene glycol (MIRALAX / GLYCOLAX) packet 17  g, 17 g, Oral, Daily PRN, Cox, Amy N, DO   QUEtiapine (SEROQUEL) tablet 25 mg, 25 mg, Oral, QHS, Marrion Coy, MD   rosuvastatin (CRESTOR) tablet 40 mg, 40 mg, Oral, QHS, Cox, Amy N, DO, 40 mg at 05/17/23 2355   senna-docusate (Senokot-S) tablet 1 tablet, 1 tablet, Oral, QHS PRN, Cox, Amy N, DO   Family History  Problem Relation Age of Onset   Hypertension Mother    Arrhythmia Mother    Heart failure Mother    Arrhythmia Brother    Stroke Brother 44       cerebral hemorrhage, nonsmoker, no HTN   Prostate cancer Brother    Stroke Father        from an aneurysm   Stroke Maternal Aunt 83       cerebral hemorrhage   Liver cancer Maternal Grandmother    Atrial fibrillation Son    Celiac disease Son    Heart attack Neg Hx    Breast cancer Neg Hx      Social History   Tobacco Use   Smoking status: Never   Smokeless tobacco: Never  Vaping Use   Vaping Use: Never used  Substance Use Topics   Alcohol use: No    Alcohol/week: 3.0 standard drinks of alcohol    Types: 3 Standard drinks or equivalent per week   Drug use: No    Allergies as of 05/17/2023 - Review Complete 05/17/2023  Allergen Reaction Noted   Codeine Nausea And Vomiting and Other (See Comments) 05/01/2011   Epinephrine Palpitations and Shortness Of Breath 12/16/1974   Hydrocodone Nausea And Vomiting and Other (See Comments) 05/01/2011   Hydromorphone Nausea And Vomiting and Other (See Comments) 09/07/2013   Molds & smuts Anxiety, Other (See Comments), and Shortness Of Breath 12/03/1993   Oxycodone Nausea And Vomiting and Other (See Comments) 05/01/2011   Meloxicam Other (See Comments) 05/01/2011   Codeine Other (See Comments) 06/18/2021   Dextromethorphan Other (See Comments) 12/09/2018   Diphen [diphenhydramine hcl] Other (See Comments) 06/18/2021   Diphenhydramine Other (See Comments) 11/06/2018   Diphenhydramine hcl  11/06/2018   Diphenylpyraline Other (See Comments) 06/05/2021   Doxycycline Itching,  Swelling, and Other (See Comments) 05/01/2011   Doxycycline Itching and Swelling 06/18/2021   Hydrocodone Other (See Comments) 06/18/2021   Hydromorphone Other (See Comments) 06/18/2021   Meloxicam Other (See Comments) 06/05/2021   Mobic [meloxicam] Other (See Comments) 06/18/2021   Nsaids  06/05/2021   Nsaids Other (See Comments) 06/18/2021   Oxycodone Other (See Comments) 06/18/2021   Propofol Other (See Comments) 08/22/2015   Propofol Other (See Comments) 06/18/2021    Review of Systems:    All systems reviewed and negative except where noted in HPI.   Physical Exam:  Vital signs in last 24 hours: Temp:  [97.6 F (36.4 C)-98.5 F (36.9 C)] 98.5 F (36.9 C) (06/16 0825) Pulse Rate:  [73-93] 73 (06/16 0825) Resp:  [16-20] 16 (06/16 0825) BP: (111-137)/(64-86) 114/64 (06/16 0825) SpO2:  [92 %-100 %] 96 % (06/16 0825) Last BM Date : 05/16/23 General:   Altered, in NAD Head:  Normocephalic and atraumatic. Eyes:   No icterus.   Conjunctiva pink. PERRLA. Ears:  Normal auditory acuity. Neck:  Supple; no masses or thyroidomegaly Lungs: Respirations even and unlabored. Lungs clear to auscultation bilaterally.   No wheezes, crackles, or rhonchi.  Heart:  Regular rate and rhythm;  Without murmur, clicks, rubs or gallops Abdomen:  Soft, nondistended, nontender. Normal bowel sounds. No appreciable masses or hepatomegaly.  No rebound or guarding.  Rectal:  Not performed. Msk:  Symmetrical without gross deformities.  Strength generalized weakness Extremities:  Without edema, cyanosis or clubbing. Neurologic:  Alert and oriented x3;  grossly normal neurologically. Skin:  Intact without significant lesions or rashes.  LAB RESULTS:    Latest Ref Rng & Units 05/18/2023    4:18 AM 05/17/2023   11:13 AM 04/30/2023    5:34 AM  CBC  WBC 4.0 - 10.5 K/uL 4.7  5.3  3.8   Hemoglobin 12.0 - 15.0 g/dL 8.1  8.8  8.4   Hematocrit 36.0 - 46.0 % 25.7  28.3  27.1   Platelets 150 - 400 K/uL 116  126   117     BMET    Latest Ref Rng & Units 05/18/2023    4:18 AM 05/17/2023   12:01 PM 04/29/2023    3:00 PM  BMP  Glucose 70 - 99 mg/dL 81  161  99   BUN 8 - 23 mg/dL 23  28  29    Creatinine 0.44 - 1.00 mg/dL 0.96  0.45  4.09   Sodium 135 - 145 mmol/L 137  136  141   Potassium 3.5 - 5.1 mmol/L 3.6  3.7  3.2   Chloride 98 - 111 mmol/L 103  100  102   CO2 22 - 32 mmol/L 25  26  28    Calcium 8.9 - 10.3 mg/dL 8.7  9.2  9.4     LFT    Latest Ref Rng & Units 05/17/2023   12:01 PM 04/29/2023    3:01 PM 04/23/2023    1:27 AM  Hepatic Function  Total Protein 6.5 - 8.1 g/dL 6.8  6.1  6.5   Albumin 3.5 - 5.0 g/dL 4.3  3.7  4.2   AST 15 - 41 U/L 47  33  50   ALT 0 - 44 U/L 26  24  27    Alk Phosphatase 38 - 126 U/L 123  109  111   Total Bilirubin 0.3 - 1.2 mg/dL 1.5  0.9  1.0   Bilirubin, Direct 0.0 - 0.2 mg/dL  0.2       STUDIES: CT ABDOMEN PELVIS W CONTRAST  Result Date: 05/17/2023 CLINICAL DATA:  82 year old female with abdominal pain, falls, bruising to the abdomen and back. EXAM: CT ABDOMEN AND PELVIS WITH CONTRAST TECHNIQUE: Multidetector CT imaging of the abdomen and pelvis was performed using  the standard protocol following bolus administration of intravenous contrast. RADIATION DOSE REDUCTION: This exam was performed according to the departmental dose-optimization program which includes automated exposure control, adjustment of the mA and/or kV according to patient size and/or use of iterative reconstruction technique. CONTRAST:  OMNIPAQUE IOHEXOL 300 MG/ML  SOLN COMPARISON:  CT Abdomen and Pelvis 04/23/2023. FINDINGS: Lower chest: Partially visible cardiomegaly appears stable. No pericardial or pleural effusion. Partially visible cardiac pacer lead. Hepatobiliary: Numerous chronic hepatic cysts have not significantly changed since 2017 and are benign (no follow-up imaging recommended). Diminutive, negative gallbladder. Pancreas: Within normal limits. Spleen: Negative.  Adrenals/Urinary Tract: Normal adrenal glands. Nonobstructed kidneys. Occasional chronic renal cysts also present in 2017 (no follow-up imaging recommended). Symmetric renal enhancement and contrast excretion with no hydronephrosis or hydroureter. But the urinary bladder is markedly distended today (sagittal image 63). Estimated bladder volume 920 mL. Stomach/Bowel: No dilated large or small bowel. Retained stool throughout the colon. Normal appendix on series 2, image 43. Decompressed stomach. No free air or free fluid. Vascular/Lymphatic: Extensive Aortoiliac calcified atherosclerosis. Major arterial structures remain patent. No lymphadenopathy. Early portal venous timing on the initial phase, on the delayed images the main portal venous structures appear to be patent. Reproductive: Negative. Other: No pelvic free fluid. Musculoskeletal: Moderate to severe dextroconvex lumbar scoliosis and widespread lumbar, lower thoracic spine degeneration. Chronic lower lumbar spondylolisthesis. Chronic right posterior rib fractures appear stable. But there is a non healed transverse fracture of the sacrum through the S3 level (sagittal images 56 through 61) which is now minimally displaced, but likely subacute. Superimposed bilateral sacral ala insufficiency fractures with sclerosis, stable. SI joints remain intact. Chronic fractures of the right pubic rami, bordering the anterior right acetabulum again noted. Previous left femur ORIF. IMPRESSION: 1. A transverse fracture through the Sacrum at S3 is mildly displaced, but appears subacute, and is superimposed on multiple chronic sacral and pelvic fractures. Query associated S3 to S5 sacral radiculopathy. 2. Marked urinary bladder distension (920 mL). Query urinary retention. 3. No other acute or inflammatory process identified in the abdomen or pelvis. Aortic Atherosclerosis (ICD10-I70.0). Electronically Signed   By: Odessa Fleming M.D.   On: 05/17/2023 13:04      Impression /  Plan:   [name] [name] is a 82 y.o. female with A-fib on Eliquis, s/p bioprosthetic mitral valve replacement, s/p permanent pacemaker, CKD, GERD, pulmonary hypertension, mild chronic thrombocytopenia, chronic iron deficiency anemia is admitted with nausea, vomiting, epigastric pain and coffee-ground emesis, as well as UTI  Nausea, vomiting, epigastric pain and coffee-ground emesis Acute on chronic iron deficiency anemia Differentials include erosive esophagitis or erosive gastritis or peptic ulcer disease CT abdomen pelvis with contrast did not reveal any acute intra-abdominal pathology Continue IV PPI Monitor CBC closely to maintain hemoglobin above 8 Patient is currently being treated for UTI, hemodynamically is stable Eliquis has been held since 6/15, Day 2 Will proceed with upper endoscopy tomorrow N.p.o. effective 5 AM tomorrow Okay with clear liquid diet today Recommend parenteral iron therapy  Chronic mild thrombocytopenia, likely ITP No evidence of splenomegaly based on CT abdomen No evidence of chronic liver disease/cirrhosis of liver or portal hypertension  Thank you for involving me in the care of this patient.  Dr. Servando Snare will cover from tomorrow    LOS: 1 day   Lannette Donath, MD  05/18/2023, 1:34 PM    Note: This dictation was prepared with Dragon dictation along with smaller phrase technology. Any transcriptional errors that result from this  process are unintentional.

## 2023-05-19 ENCOUNTER — Encounter: Payer: Self-pay | Admitting: Internal Medicine

## 2023-05-19 ENCOUNTER — Inpatient Hospital Stay: Payer: Medicare Other | Admitting: Registered Nurse

## 2023-05-19 ENCOUNTER — Encounter
Admission: EM | Disposition: A | Discharge status: Skilled Nursing Facility | Payer: Self-pay | Source: Skilled Nursing Facility | Attending: Internal Medicine

## 2023-05-19 DIAGNOSIS — N1831 Chronic kidney disease, stage 3a: Secondary | ICD-10-CM | POA: Diagnosis not present

## 2023-05-19 DIAGNOSIS — D696 Thrombocytopenia, unspecified: Secondary | ICD-10-CM | POA: Diagnosis not present

## 2023-05-19 DIAGNOSIS — K920 Hematemesis: Secondary | ICD-10-CM | POA: Diagnosis not present

## 2023-05-19 DIAGNOSIS — K922 Gastrointestinal hemorrhage, unspecified: Secondary | ICD-10-CM | POA: Diagnosis not present

## 2023-05-19 HISTORY — PX: ESOPHAGOGASTRODUODENOSCOPY (EGD) WITH PROPOFOL: SHX5813

## 2023-05-19 LAB — CBC
HCT: 27 % — ABNORMAL LOW (ref 36.0–46.0)
Hemoglobin: 8.3 g/dL — ABNORMAL LOW (ref 12.0–15.0)
MCH: 29.3 pg (ref 26.0–34.0)
MCHC: 30.7 g/dL (ref 30.0–36.0)
MCV: 95.4 fL (ref 80.0–100.0)
Platelets: 111 10*3/uL — ABNORMAL LOW (ref 150–400)
RBC: 2.83 MIL/uL — ABNORMAL LOW (ref 3.87–5.11)
RDW: 17.4 % — ABNORMAL HIGH (ref 11.5–15.5)
WBC: 4.9 10*3/uL (ref 4.0–10.5)
nRBC: 0 % (ref 0.0–0.2)

## 2023-05-19 LAB — BASIC METABOLIC PANEL
Anion gap: 9 (ref 5–15)
BUN: 19 mg/dL (ref 8–23)
CO2: 25 mmol/L (ref 22–32)
Calcium: 8.7 mg/dL — ABNORMAL LOW (ref 8.9–10.3)
Chloride: 103 mmol/L (ref 98–111)
Creatinine, Ser: 0.96 mg/dL (ref 0.44–1.00)
GFR, Estimated: 59 mL/min — ABNORMAL LOW (ref >60)
Glucose, Bld: 95 mg/dL (ref 70–99)
Potassium: 3.5 mmol/L (ref 3.5–5.1)
Sodium: 137 mmol/L (ref 135–145)

## 2023-05-19 LAB — URINE CULTURE

## 2023-05-19 LAB — GLUCOSE, CAPILLARY
Glucose-Capillary: 93 mg/dL (ref 70–99)
Glucose-Capillary: 99 mg/dL (ref 70–99)
Glucose-Capillary: 95 mg/dL (ref 70–99)

## 2023-05-19 LAB — MAGNESIUM: Magnesium: 2.2 mg/dL (ref 1.7–2.4)

## 2023-05-19 LAB — CULTURE, BLOOD (ROUTINE X 2): Culture: NO GROWTH

## 2023-05-19 SURGERY — ESOPHAGOGASTRODUODENOSCOPY (EGD) WITH PROPOFOL
Anesthesia: General

## 2023-05-19 MED ORDER — PANTOPRAZOLE SODIUM 40 MG PO TBEC
40.0000 mg | DELAYED_RELEASE_TABLET | Freq: Two times a day (BID) | ORAL | Status: DC
  Administered 2023-05-20 (×2): 40 mg via ORAL
  Filled 2023-05-19 (×2): qty 1

## 2023-05-19 MED ORDER — SODIUM CHLORIDE 0.9 % IV SOLN
1.0000 g | Freq: Three times a day (TID) | INTRAVENOUS | Status: DC
  Administered 2023-05-19 – 2023-05-20 (×3): 1 g via INTRAVENOUS
  Filled 2023-05-19 (×5): qty 1000

## 2023-05-19 MED ORDER — PROPOFOL 500 MG/50ML IV EMUL
INTRAVENOUS | Status: DC | PRN
  Administered 2023-05-19: 80 ug/kg/min via INTRAVENOUS

## 2023-05-19 MED ORDER — SODIUM CHLORIDE 0.9 % IV SOLN: INTRAVENOUS | Status: DC | PRN

## 2023-05-19 MED ORDER — SODIUM CHLORIDE 0.9 % IV SOLN: INTRAVENOUS | Status: DC

## 2023-05-19 MED ORDER — PROPOFOL 10 MG/ML IV BOLUS
INTRAVENOUS | Status: DC | PRN
  Administered 2023-05-19: 40 mg via INTRAVENOUS

## 2023-05-19 MED ORDER — LIDOCAINE HCL (CARDIAC) PF 100 MG/5ML IV SOSY
PREFILLED_SYRINGE | INTRAVENOUS | Status: DC | PRN
  Administered 2023-05-19: 40 mg via INTRAVENOUS

## 2023-05-19 NOTE — Transfer of Care (Signed)
Immediate Anesthesia Transfer of Care Note  Patient: [name]  Procedure(s) Performed: Procedure(s): ESOPHAGOGASTRODUODENOSCOPY (EGD) WITH PROPOFOL (N/A)  Patient Location: PACU and Endoscopy Unit  Anesthesia Type:General  Level of Consciousness: sedated  Airway & Oxygen Therapy: Patient Spontanous Breathing and Patient connected to nasal cannula oxygen  Post-op Assessment: Report given to RN and Post -op Vital signs reviewed and stable  Post vital signs: Reviewed and stable  Last Vitals:  Vitals:   05/19/23 1319 05/19/23 1351  BP: 109/75 96/67  Pulse: 76 77  Resp: 17 16  Temp: (!) 35.7 C 36.8 C  SpO2: 97% 96%    Complications: No apparent anesthesia complications

## 2023-05-19 NOTE — TOC Progression Note (Signed)
Transition of Care [facility]) - Progression Note    Patient Details  Name: [name] [name] MRN: [id number] Date of Birth: 07/25/1941  Transition of Care [facility]) CM/SW Contact  Chapman Fitch, RN Phone Number: 05/19/2023, 11:34 AM  Clinical Narrative:    Per MD patient not medically ready for dc Plan for EGD today.  Updated [name] at [facility]        Expected Discharge Plan and Services       Living arrangements for the past 2 months: Independent Living Facility                                       Social Determinants of Health (SDOH) Interventions SDOH Screenings   Food Insecurity: No Food Insecurity (05/17/2023)  Housing: Patient Declined (05/17/2023)  Recent Concern: Housing - Medium Risk (03/17/2023)  Transportation Needs: No Transportation Needs (05/17/2023)  Utilities: Not At Risk (05/17/2023)  Depression (PHQ2-9): High Risk (05/08/2023)  Financial Resource Strain: Low Risk  (03/17/2023)  Physical Activity: Unknown (03/17/2023)  Social Connections: Unknown (03/17/2023)  Stress: No Stress Concern Present (03/17/2023)  Tobacco Use: Low Risk  (05/17/2023)    Readmission Risk Interventions     No data to display

## 2023-05-19 NOTE — Progress Notes (Signed)
  Progress Note   Patient: [name] [name] QIO:[id number] DOB: 12/06/1940 DOA: 05/17/2023     2 DOS: the patient was seen and examined on 05/19/2023   Brief hospital course: [name] is an 82 year old female with paroxysmal atrial fibrillation, on Eliquis, GERD, who presents to the emergency department for chief concerns of coffee-ground emesis, distended abdomen. Hemoglobin is low, but stable, placed on Protonix.  6/17: EGD today, had bilious vomit earlier    Assessment and Plan:  * Upper GI bleed Anemia in chronic kidney disease Chronic thrombocytopenia. Patient had coffee-ground emesis at time of admission.  Continue Protonix drip.  EGD planned for today.  Eliquis on hold   Psychosis with auditory hallucination. Attempt to reach family without success.  Patient has been hearing multiple voices which is unusual for delirium in elderly. CT head -left parietal scalp hematoma without any fracture Continue Seroquel every evening.  So far, patient has been cooperating.  E faecalis UTI Urine culture growing Enterococcus faecalis.  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  Goals of care Will consult palliative care.  I could not get hold of her son.  I have left voicemail.  Overall poor prognosis.  She is DNR     Subjective: Pleasantly confused.  Had bilious vomit once this morning  Physical Exam: Vitals:   05/18/23 1610 05/18/23 1957 05/19/23 0255 05/19/23 0901  BP: 105/65 108/60 108/69 112/70  Pulse: 81 83 74 73  Resp: 18 19 16 18   Temp: 98.1 F (36.7 C) 98.2 F (36.8 C) 98.4 F (36.9 C) 97.9 F (36.6 C)  TempSrc:  Oral  Oral   SpO2: 95% 94% 100% 100%  Weight:      Height:       General exam: Appears calm and comfortable  Respiratory system: Clear to auscultation. Respiratory effort normal. Cardiovascular system: S1 & S2 heard, RRR. No JVD, murmurs, rubs, gallops or clicks. No pedal edema. Gastrointestinal system: Abdomen is nondistended, soft and nontender. No organomegaly or masses felt. Normal bowel sounds heard. Central nervous system: Alert and oriented x1. No focal neurological deficits. Extremities: Symmetric 5 x 5 power. Skin: No rashes, lesions or ulcers Psychiatry: Flat affect  Data Reviewed:  Urine culture growing E faecalis, hemoglobin 8.3  Family Communication: Left voicemail/message for her [name]  Disposition: Status is: Inpatient Remains inpatient appropriate because: Management of GI bleed and altered mental status  Planned Discharge Destination:  [facility]   DVT prophylaxis-SCDs Time spent: 35 minutes  Author: Delfino Lovett, MD 05/19/2023 12:42 PM  For on call review [email or web address].

## 2023-05-19 NOTE — Op Note (Signed)
[facility] Gastroenterology Patient Name: [name] [name] Procedure Date: 05/19/2023 1:29 PM MRN: [id number] Account #: [id number] Date of Birth: 1941/05/29 Admit Type: Outpatient Age: 82 Room: [facility] ENDO ROOM 1 Gender: Female Note Status: Finalized Instrument Name: Upper Endoscope [id number] Procedure:             Upper GI endoscopy Indications:           Hematemesis Providers:             Midge Minium MD, MD Medicines:             Propofol per Anesthesia Complications:         No immediate complications. Procedure:             Pre-Anesthesia Assessment:                        - Prior to the procedure, a History and Physical was                         performed, and patient medications and allergies were                         reviewed. The patient's tolerance of previous                         anesthesia was also reviewed. The risks and benefits                         of the procedure and the sedation options and risks                         were discussed with the patient. All questions were                         answered, and informed consent was obtained. Prior                         Anticoagulants: The patient has taken Eliquis                         (apixaban), last dose was 3 days prior to procedure.                         ASA Grade Assessment: III - A patient with severe                         systemic disease. After reviewing the risks and                         benefits, the patient was deemed in satisfactory                         condition to undergo the procedure.                        After obtaining informed consent, the endoscope was                         passed under direct vision. Throughout the  procedure,                         the patient's blood pressure, pulse, and oxygen                         saturations were monitored continuously. The Endoscope                         was introduced through the mouth, and advanced to the                          second part of duodenum. The upper GI endoscopy was                         accomplished without difficulty. The patient tolerated                         the procedure well. Findings:      The examined esophagus was normal.      Diffuse moderate inflammation characterized by erythema was found in the       gastric body and in the gastric antrum.      The examined duodenum was normal. Impression:            - Normal esophagus.                        - Gastritis.                        - Normal examined duodenum.                        - No specimens collected. Recommendation:        - Return patient to hospital ward for ongoing care.                        - Resume regular diet.                        - Continue present medications.                        - Follow Hb.                        Follow up with Dr. Allegra Lai as an outpatient for                         consideration of a colonoscopy. Procedure Code(s):     --- Professional ---                        [phone], Esophagogastroduodenoscopy, flexible,                         transoral; diagnostic, including collection of                         specimen(s) by brushing or washing, when performed                         (  separate procedure) Diagnosis Code(s):     --- Professional ---                        K92.0, Hematemesis                        K29.70, Gastritis, unspecified, without bleeding CPT copyright 2022 American Medical Association. All rights reserved. The codes documented in this report are preliminary and upon coder review may  be revised to meet current compliance requirements. Midge Minium MD, MD 05/19/2023 1:49:06 PM This report has been signed electronically. Number of Addenda: 0 Note Initiated On: 05/19/2023 1:29 PM Estimated Blood Loss:  Estimated blood loss: none.      [facility]

## 2023-05-19 NOTE — Progress Notes (Signed)
MD and GI providers where notified that the patient had vomited an unmeasured amount of yellow bile.

## 2023-05-19 NOTE — Progress Notes (Signed)
Foley care provided and CHG wipe down completed

## 2023-05-19 NOTE — Progress Notes (Signed)
RN attempted to call son 4 times with out success as well as daughter called twice. Voicemail has been left for both of them to know current EGD is schedule for 1250.

## 2023-05-19 NOTE — Anesthesia Preprocedure Evaluation (Signed)
Anesthesia Evaluation  Patient identified by MRN, date of birth, ID band Patient awake    Reviewed: Allergy & Precautions, NPO status , Patient's Chart, lab work & pertinent test results  History of Anesthesia Complications Negative for: history of anesthetic complications  Airway Mallampati: IV   Neck ROM: Full    Dental no notable dental hx.    Pulmonary neg pulmonary ROS   Pulmonary exam normal breath sounds clear to auscultation       Cardiovascular +CHF (NICM, EF 35-40%)  + dysrhythmias (a fib) Supra Ventricular Tachycardia + pacemaker (SSS) + Valvular Problems/Murmurs (s/p MVR 2016)  Rhythm:Regular Rate:Normal + Systolic murmurs ECG 05/17/23:  Sinus rhythm Short PR interval Right atrial enlargement Right bundle branch block  Echo 12/11/22:  1. Left ventricular ejection fraction, by estimation, is 35 to 40%. The left ventricle has moderately decreased function. The left ventricle demonstrates global hypokinesis. There is mild left ventricular hypertrophy. Left ventricular diastolic parameters are indeterminate.   2. Right ventricular systolic function is mildly reduced. The right ventricular size is severely enlarged.   3. Left atrial size was mild to moderately dilated.   4. Right atrial size was severely dilated.   5. The mitral valve has been repaired/replaced. No evidence of mitral valve regurgitation. There is a 33 mm Edwards bioprosthetic valve present in the mitral position. Procedure Date: 10/2015.   6. Tricuspid valve regurgitation is mild to moderate.   7. The aortic valve is tricuspid. Aortic valve regurgitation is not visualized.   8. The inferior vena cava is dilated in size with <50% respiratory variability, suggesting right atrial pressure of 15 mmHg.    Neuro/Psych Seizures -,  PSYCHIATRIC DISORDERS Anxiety Depression    TIA   GI/Hepatic negative GI ROS,,,  Endo/Other  negative endocrine ROS     Renal/GU Renal disease (stage III CKD)     Musculoskeletal   Abdominal   Peds  Hematology  (+) Blood dyscrasia, anemia   Anesthesia Other Findings Cardiology note 03/04/23:  1. Paroxysmal atrial fibrillation with paced rhythm noted on EKG.  She is continued on apixaban 5 mg twice daily for CHA2DS2-VASc score 6.  She is also continued on Toprol-XL.  She is status post MAZE, and LAA clip.  She denies any signs of bleeding as well on the apixaban.   2. Nonischemic cardiomyopathy/HFrEF  with LVEF of 35-40% on recent echocardiogram completed in January 2024.  She previously did not tolerate Entresto.  She is continued on Toprol-XL, losartan, furosemide, and spironolactone.  She does require refill of her losartan today as well to the pharmacy of choice. Declined SGLT2i.   3. Hyperlipidemia with cholesterol that had been at goal.  She is continued on rosuvastatin 40 mg daily.  There were no changes made to her medication regimen today. Will need FLP ordered on return.   4. History of SVT with prior ablation completed.  She is continued on Toprol-XL.  Remained stable at this time.   5. Seizures very continue to be followed by neurology.  She sees Dr. Clelia Croft.  Seizures were exacerbated by anesthesia in the past.  She is continued on Lamictal and brivaracetam.   6. Peripheral edema that is asymmetric and has been for an extended period of time.  Patient has had fluctuations in swelling and has previously taken increased furosemide with improvement in swelling.  Unfortunately she stated that she had a red area that popped but knew the back of her knee that was painful and was unable she  was unable to place her compression stockings.  Patient had concern for possible DVT.  With change in symptoms she has been sent for an ultrasound of that leg to rule out DVT.  It is likely not a DVT due to her long term history on apixaban.  She does complain of discomfort and pain and there is a small ulceration noted to  the third toe on the right foot.  She has been scheduled for ABIs to determine any circulation problems.  She currently takes her furosemide 2 days a week wears her compression stockings and elevates her extremities.  We did discuss about increasing her furosemide but she is concerned about her kidney function.  We did advise her to increase her furosemide to 3 times a week and will repeat a BMP in 2 weeks to reevaluate her kidney function.   7. Sick sinus syndrome s/p dual pacemaker. Continues to be followed by Dr Ladona Ridgel.   8. 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 Disposition patient return to clinic to see MD/APP once her testing has been completed, or sooner if needed.   Reproductive/Obstetrics                             Anesthesia Physical Anesthesia Plan  ASA: 3  Anesthesia Plan: General   Post-op Pain Management:    Induction: Intravenous  PONV Risk Score and Plan: 3 and Propofol infusion, TIVA and Treatment may vary due to age or medical condition  Airway Management Planned: Natural Airway  Additional Equipment:   Intra-op Plan:   Post-operative Plan:   Informed Consent: I have reviewed the patients History and Physical, chart, labs and discussed the procedure including the risks, benefits and alternatives for the proposed anesthesia with the patient or authorized representative who has indicated his/her understanding and acceptance.   Patient has DNR.  Discussed DNR with power of attorney and Suspend DNR.   Consent reviewed with POA  Plan Discussed with: CRNA  Anesthesia Plan Comments: (History and consent obtained from daughter [name] via phone.  LMA/GETA backup discussed.  Patient's daughter consented for risks of anesthesia including but not limited to:  - adverse reactions to medications - damage to eyes, teeth, lips or other oral  mucosa - nerve damage due to positioning  - sore throat or hoarseness - damage to heart, brain, nerves, lungs, other parts of body or loss of life  All questions answered and concerns addressed.)       Anesthesia Quick Evaluation

## 2023-05-20 ENCOUNTER — Encounter: Payer: Self-pay | Admitting: Gastroenterology

## 2023-05-20 DIAGNOSIS — R569 Unspecified convulsions: Secondary | ICD-10-CM | POA: Diagnosis not present

## 2023-05-20 DIAGNOSIS — R339 Retention of urine, unspecified: Secondary | ICD-10-CM

## 2023-05-20 DIAGNOSIS — R440 Auditory hallucinations: Secondary | ICD-10-CM

## 2023-05-20 DIAGNOSIS — N3001 Acute cystitis with hematuria: Secondary | ICD-10-CM

## 2023-05-20 DIAGNOSIS — Z515 Encounter for palliative care: Secondary | ICD-10-CM

## 2023-05-20 DIAGNOSIS — N1831 Chronic kidney disease, stage 3a: Secondary | ICD-10-CM | POA: Diagnosis not present

## 2023-05-20 DIAGNOSIS — K922 Gastrointestinal hemorrhage, unspecified: Secondary | ICD-10-CM | POA: Diagnosis not present

## 2023-05-20 DIAGNOSIS — K920 Hematemesis: Secondary | ICD-10-CM | POA: Diagnosis not present

## 2023-05-20 LAB — BASIC METABOLIC PANEL
Anion gap: 6 (ref 5–15)
BUN: 18 mg/dL (ref 8–23)
CO2: 26 mmol/L (ref 22–32)
Calcium: 8.3 mg/dL — ABNORMAL LOW (ref 8.9–10.3)
Chloride: 104 mmol/L (ref 98–111)
Creatinine, Ser: 0.87 mg/dL (ref 0.44–1.00)
GFR, Estimated: >60 mL/min (ref >60)
Glucose, Bld: 84 mg/dL (ref 70–99)
Potassium: 3.6 mmol/L (ref 3.5–5.1)
Sodium: 136 mmol/L (ref 135–145)

## 2023-05-20 LAB — CBC
HCT: 24.7 % — ABNORMAL LOW (ref 36.0–46.0)
Hemoglobin: 7.7 g/dL — ABNORMAL LOW (ref 12.0–15.0)
MCH: 29.7 pg (ref 26.0–34.0)
MCHC: 31.2 g/dL (ref 30.0–36.0)
MCV: 95.4 fL (ref 80.0–100.0)
Platelets: 109 10*3/uL — ABNORMAL LOW (ref 150–400)
RBC: 2.59 MIL/uL — ABNORMAL LOW (ref 3.87–5.11)
RDW: 17.4 % — ABNORMAL HIGH (ref 11.5–15.5)
WBC: 4.1 10*3/uL (ref 4.0–10.5)
nRBC: 0 % (ref 0.0–0.2)

## 2023-05-20 LAB — CULTURE, BLOOD (ROUTINE X 2): Culture: NO GROWTH

## 2023-05-20 MED ORDER — PANTOPRAZOLE SODIUM 40 MG PO TBEC: 40.0000 mg | DELAYED_RELEASE_TABLET | Freq: Two times a day (BID) | ORAL | 0 refills | Status: DC

## 2023-05-20 NOTE — TOC Transition Note (Addendum)
Transition of Care [facility]) - CM/SW Discharge Note   Patient Details  Name: [name] [name] MRN: [id number] Date of Birth: March 15, 1941  [facility] CM/SW Contact:  Kreg Shropshire, RN Phone Number: 05/20/2023, 3:14 PM   Clinical Narrative:     Patient will DC to: [facility] Anticipated DC date: 6/28 Family notified: Left voicemail for son Patsey Pontoriero Transport by: Non emergent EMS  Per MD patient ready for DC to . RN, patient's family, and facility notified of DC. Discharge Summary sent to facility. RN given number for report. DC packet on chart. Ambulance transport requested for patient. At this time, unconfirmed if pt will be going to [facility] with foley. Per, Michel Santee of [facility] stated they will take pt back with foley in place if needed.  TOC signing off.   [facility] does not have hub. Sent d/c summary via fax [phone]  Final next level of care: Skilled Nursing Facility     Patient Goals and CMS Choice      Discharge Placement PASRR number recieved: 04/30/23 PASRR number recieved: 04/30/23 Existing PASRR number confirmed : 05/20/23          Patient chooses bed at: [facility] Patient to be transferred to facility by: non emergent ems Name of family member notified: Left Voice mail on Randilynn Fraze Patient and family notified of of transfer: 05/20/23  Discharge Plan and Services Additional resources added to the After Visit Summary for                                       Social Determinants of Health (SDOH) Interventions SDOH Screenings   Food Insecurity: No Food Insecurity (05/17/2023)  Housing: Patient Declined (05/17/2023)  Recent Concern: Housing - Medium Risk (03/17/2023)  Transportation Needs: No Transportation Needs (05/17/2023)  Utilities: Not At Risk (05/17/2023)  Depression (PHQ2-9): High Risk (05/08/2023)  Financial Resource Strain: Low Risk  (03/17/2023)  Physical  Activity: Unknown (03/17/2023)  Social Connections: Unknown (03/17/2023)  Stress: No Stress Concern Present (03/17/2023)  Tobacco Use: Low Risk  (05/20/2023)     Readmission Risk Interventions     No data to display

## 2023-05-20 NOTE — Anesthesia Postprocedure Evaluation (Signed)
Anesthesia Post Note  Patient: [name] [name]  Procedure(s) Performed: ESOPHAGOGASTRODUODENOSCOPY (EGD) WITH PROPOFOL  Patient location during evaluation: PACU Anesthesia Type: General Level of consciousness: awake and alert, oriented and patient cooperative Pain management: pain level controlled Vital Signs Assessment: post-procedure vital signs reviewed and stable Respiratory status: spontaneous breathing, nonlabored ventilation and respiratory function stable Cardiovascular status: blood pressure returned to baseline and stable Postop Assessment: adequate PO intake Anesthetic complications: no   No notable events documented.   Last Vitals:  Vitals:   05/20/23 0340 05/20/23 0800  BP: 107/61 (!) 97/53  Pulse: 71 77  Resp: 18 18  Temp: 37.4 C 36.8 C  SpO2: 93%     Last Pain:  Vitals:   05/20/23 0800  TempSrc: Oral  PainSc:                  Reed Breech

## 2023-05-20 NOTE — TOC CM/SW Note (Addendum)
Pt d/c orders. Called Son Jamiria Hannold to inform of d/c and transportation via non emergent EMS back to [facility]. Left voicemail to call back.

## 2023-05-20 NOTE — Plan of Care (Signed)
The patient has been discharged. IV has been removed. Report called to [facility], was given report.  Problem: Education: Goal: Knowledge of General Education information will improve Description: Including pain rating scale, medication(s)/side effects and non-pharmacologic comfort measures Outcome: Adequate for Discharge   Problem: Health Behavior/Discharge Planning: Goal: Ability to manage health-related needs will improve Outcome: Adequate for Discharge   Problem: Clinical Measurements: Goal: Ability to maintain clinical measurements within normal limits will improve Outcome: Adequate for Discharge Goal: Will remain free from infection Outcome: Adequate for Discharge Goal: Diagnostic test results will improve Outcome: Adequate for Discharge Goal: Respiratory complications will improve Outcome: Adequate for Discharge Goal: Cardiovascular complication will be avoided Outcome: Adequate for Discharge   Problem: Activity: Goal: Risk for activity intolerance will decrease Outcome: Adequate for Discharge   Problem: Nutrition: Goal: Adequate nutrition will be maintained Outcome: Adequate for Discharge   Problem: Coping: Goal: Level of anxiety will decrease Outcome: Adequate for Discharge

## 2023-05-20 NOTE — Progress Notes (Signed)
[name] Minium, MD [facility]   [address], [address] [place], [place] [address] Phone: [phone] Fax : [phone]   Subjective: The patient had no sign of any active bleeding at her EGD yesterday.  This morning she is resting comfortably.  The patient has had no sign of any GI bleeding overnight but did drop her hemoglobin.   Objective: Vital signs in last 24 hours: Vitals:   05/19/23 1411 05/19/23 1500 05/19/23 1927 05/20/23 0340  BP: 116/79 120/77 120/78 107/61  Pulse:  69 70 71  Resp:  18 18 18   Temp:  98 F (36.7 C) 98.1 F (36.7 C) 99.4 F (37.4 C)  TempSrc:  Oral  Oral  SpO2:  99% 96% 93%  Weight:      Height:       Weight change:   Intake/Output Summary (Last 24 hours) at 05/20/2023 2440 Last data filed at 05/20/2023 0300 Gross per 24 hour  Intake 466.35 ml  Output 750 ml  Net -283.65 ml     Exam: Heart:: Regular rate and rhythm, S1S2 present, or without murmur or extra heart sounds Lungs: normal and clear to auscultation and percussion Abdomen: soft, nontender, normal bowel sounds   Lab Results: @LABTEST2 @ Micro Results: Recent Results (from the past 240 hour(s))  Urine Culture     Status: Abnormal   Collection Time: 05/17/23  1:53 PM   Specimen: Urine, Random  Result Value Ref Range Status   Specimen Description   Final    URINE, RANDOM Performed at [facility], [address] [place], [place] [address]    Special Requests   Final    NONE Reflexed from [id number] Performed at [facility], [address] [place], [place] [address]    Culture >=100,000 COLONIES/mL ENTEROCOCCUS FAECALIS (A)  Final   Report Status 05/19/2023 FINAL  Final   Organism ID, Bacteria ENTEROCOCCUS FAECALIS (A)  Final      Susceptibility   Enterococcus faecalis - MIC*    AMPICILLIN <=2 SENSITIVE Sensitive     NITROFURANTOIN <=16 SENSITIVE Sensitive     VANCOMYCIN 1 SENSITIVE Sensitive     * >=100,000 COLONIES/mL ENTEROCOCCUS FAECALIS  Blood  culture (routine x 2)     Status: None (Preliminary result)   Collection Time: 05/17/23  2:23 PM   Specimen: BLOOD RIGHT HAND  Result Value Ref Range Status   Specimen Description BLOOD RIGHT HAND  Final   Special Requests   Final    BOTTLES DRAWN AEROBIC AND ANAEROBIC Blood Culture adequate volume   Culture   Final    NO GROWTH 3 DAYS Performed at [facility], [address], [place], [place] [address]    Report Status PENDING  Incomplete  Blood culture (routine x 2)     Status: None (Preliminary result)   Collection Time: 05/17/23  2:28 PM   Specimen: BLOOD  Result Value Ref Range Status   Specimen Description BLOOD RIGHT ANTECUBITAL  Final   Special Requests   Final    BOTTLES DRAWN AEROBIC AND ANAEROBIC Blood Culture results may not be optimal due to an excessive volume of blood received in culture bottles   Culture   Final    NO GROWTH 3 DAYS Performed at [facility], [address], [place], [place] [address]    Report Status PENDING  Incomplete   Studies/Results: CT HEAD WO CONTRAST ( )  Result Date: 05/18/2023 CLINICAL DATA:  Delirium. EXAM: CT HEAD WITHOUT CONTRAST TECHNIQUE: Contiguous axial  images were obtained from the base of the skull through the vertex without intravenous contrast. RADIATION DOSE REDUCTION: This exam was performed according to the departmental dose-optimization program which includes automated exposure control, adjustment of the mA and/or kV according to patient size and/or use of iterative reconstruction technique. COMPARISON:  CT head without contrast 04/29/2023 FINDINGS: Brain: Mild atrophy and white matter changes are similar the prior study. No acute infarct, hemorrhage, or mass lesion is present. Deep brain nuclei are within normal limits. The ventricles are of normal size. No significant extraaxial fluid collection is present. The brainstem and cerebellum are within normal limits. Midline structures are within normal limits.  Vascular: Atherosclerotic calcifications are present within the cavernous internal carotid arteries bilaterally. No hyperdense vessel is present. Skull: A left parietal scalp hematoma is present. Underlying fracture or foreign body is present. Sinuses/Orbits: The paranasal sinuses and mastoid air cells are clear. Right lens replacement is present. Globes and orbits are otherwise within normal limits. IMPRESSION: 1. Left parietal scalp hematoma without underlying fracture or foreign body. 2. Stable mild atrophy and white matter disease. This likely reflects the sequela of chronic microvascular ischemia. 3. No acute intracranial abnormality or significant interval change. Electronically Signed   By: Marin Roberts M.D.   On: 05/18/2023 17:45   Medications: I have reviewed the patient's current medications. Scheduled Meds:  ARIPiprazole  10 mg Oral QPM   Chlorhexidine Gluconate Cloth  6 each Topical Daily   gabapentin  200 mg Oral TID   lamoTRIgine  150 mg Oral q morning   lamoTRIgine  200 mg Oral QHS   metoprolol tartrate  25 mg Oral Daily   montelukast  10 mg Oral QHS   pantoprazole  40 mg Oral BID AC   QUEtiapine  25 mg Oral QHS   rosuvastatin  40 mg Oral QHS   Continuous Infusions:  sodium chloride 10 mL/hr at 05/20/23 0500   ampicillin (OMNIPEN) IV 1 g (05/20/23 0500)   PRN Meds:.sodium chloride, acetaminophen **OR** acetaminophen, albuterol, metaxalone, ondansetron **OR** ondansetron (ZOFRAN) IV, polyethylene glycol, senna-docusate   Assessment: Principal Problem:   Upper GI bleed Active Problems:   Thrombocytopenia (HCC)   Pulmonary hypertension (HCC)   H/O mitral valve replacement   Anemia in chronic kidney disease   Focal seizures (HCC)   Carotid stenosis   PAF (paroxysmal atrial fibrillation) (HCC)   Hyperlipidemia   Chronic kidney disease (CKD) stage G3a/A2, moderately decreased glomerular filtration rate (GFR) between 45-59 mL/min/1.73 square meter and albuminuria  creatinine ratio between 30-299 mg/g (HCC)   Right sided sciatica   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  The patient did have a drop in hemoglobin without any obvious source.  The patient's last colonoscopy was in 2015 by Dr. Lemar Livings colonoscopy.  The patient has not shown any signs of lower GI bleeding and the anemia may be multifactorial.  I believe the patient should undergo a colonoscopy in the future and the timing will need to be determined whether it will be during this admission or as an outpatient.  [name] [name], [facility] 05/20/2023, 7:21 AM Pager [phone] 7am-5pm  Check AMION for 5pm -7am coverage and on weekends

## 2023-05-20 NOTE — Care Management Important Message (Signed)
Important Message  Patient Details  Name: [name] [name] MRN: [id number] Date of Birth: 04-03-1941   Medicare Important Message Given:  Other (see comment)  Left message with Jaxsyn Erbes, son, at [phone] to review Medicare IM and obtain initial verbal consent.  Encouraged callback.     Johnell Comings 05/20/2023, 11:56 AM

## 2023-05-20 NOTE — Progress Notes (Signed)
Civil engineer, contracting [facility]) Hospital Liaison Note:   (new referral for outpatient palliative services) Notified by [facility], Bevelyn Ngo, RN,  of patient/family request for [facility] Palliative Care services at [facility].    [facility] hospital liaison will follow patient for discharge disposition.  Please call with any hospice or outpatient palliative care related questions.  Thank you for the opportunity to participate in this patient's care.  Redge Gainer, [facility] Liaison [phone]

## 2023-05-20 NOTE — Progress Notes (Signed)
I have reviewed and concur with this student's documentation.   Leodis Sias, RN 05/20/2023 3:02 PM

## 2023-05-20 NOTE — Consult Note (Signed)
Consultation Note Date: 05/20/2023 at 0945  Patient Name: [name] [name]  DOB: 11/07/1941  MRN: [id number]  Age / Sex: 82 y.o., female  PCP: Sherlene Shams, MD Referring Physician: Delfino Lovett, MD  Reason for Consultation: Establishing goals of care  HPI/Patient Profile: 82 y.o. female  with past medical history of anemia of CKD, chronic thrombocytopenia, and proximal A-fib (Eliquis) admitted on 05/17/2023 with coffee-ground emesis.  Patient has been diagnosed with upper GI bleed as well as auditory hallucinations.  CT of abdomen revealed no acute or inflammatory process.  CT of head revealed left parietal scalp hematoma.  EGD revealed gastritis with no active bleeding.  PMT was consulted to discuss goals of care.   Clinical Assessment and Goals of Care: I have reviewed medical records including EPIC notes, labs and imaging, assessed the patient and then met with patient and her son/HCPOA [name] at bedside to discuss diagnosis prognosis, GOC, EOL wishes, disposition and options.  I introduced Palliative Medicine as specialized medical care for people living with serious illness. It focuses on providing relief from the symptoms and stress of a serious illness. The goal is to improve quality of life for both the patient and the family.  We discussed a brief life review of the patient.  Patient worked as a Char[employer] fundraiser.  She has 1 daughter and 1 son.  Her husband passed in 2011.  Patient is a resident of [facility].  She formally was in independent living but had been moved to memory care.  I attempted to gauge patient's understanding of her current medical situation.  She shares she was diagnosed with a urinary tract infection but believes that the test was falsified.  She mentions that she has pain in her back but that this is not a new issue for her.    She does not mention anything about coffee-ground emesis.  When  I outlined her current diagnoses and treatments, patient shares that a lot of it was made up and is in disbelief that she has some of her current medical problems.  Education provided on anemia, GIB, and AMS.   As far as functional and nutritional status patient sustained several falls while in memory care over the past several weeks.  We discussed patient's current illness and what it means in the larger context of patient's on-going co-morbidities.   I attempted to elicit values and goals of care important to the patient.  Patient is hopeful that she can return to her independent living facility where she enjoys watching the pond in her backyard.  She hopes to be able to walk and be more independent soon.  We discussed remaining hopeful and having realistic goals.  Functional, nutritional, and cognitive status discussed is important indicators of patient's overall prognosis.  During our discussion, patient was intermittently engaged.  She would oftentimes start sentences that were linear and cohesive to conversation but then they would trail off to unrelated topics.  She also endorses auditory hallucinations that were not commanding her.  Patient son  endorses she has had varying versions of this confusion that has increased over the past several weeks.  Psych consult pending.  Advance directives, concepts specific to code status, artificial feeding and hydration, and rehospitalization were considered and discussed.  We outlined patient's wishes as documented in her advanced directives in 2009.  DNR remains.  Patient would never be accepting of artificial nutrition or hydration.  Plan is for patient to return to [facility] today.  MOST form introduced.  Education provided on importance of documentation of patient's wishes and medical order for scope of treatment as she moved back to a skilled nursing facility.  Copy of MOST form given to son for review.  Outpatient palliative services discussed.   Palliative referral in place.  Symptoms assessed.  Patient endorses low back pain that is chronic.  Patient declines use of medications or repositioning to relieve pain or discomfort.  She shares that "nothing works".  No adjustments to [facility] needed at this time.  Discharge summary is in place.  Plan for discharge today.  PMT will remain available to patient and family during this hospitalization.  Questions and concerns were addressed. The family was encouraged to call with questions or concerns.   Primary Decision Maker HCPOA  Physical Exam Vitals reviewed.  Constitutional:      General: She is not in acute distress.    Appearance: She is not ill-appearing.  HENT:     Head: Normocephalic.     Nose: Nose normal.     Mouth/Throat:     Mouth: Mucous membranes are moist.  Eyes:     Pupils: Pupils are equal, round, and reactive to light.  Cardiovascular:     Rate and Rhythm: Normal rate.  Pulmonary:     Effort: Pulmonary effort is normal.  Abdominal:     Palpations: Abdomen is soft.  Skin:    General: Skin is warm and dry.  Neurological:     Mental Status: She is alert.     Comments: Oriented to self, place  Psychiatric:        Judgment: Judgment normal.     Palliative Assessment/Data:  40%     Thank you for this consult. Palliative medicine will continue to follow and assist holistically.   Time Total: 95 minutes Greater than 50%  of this time was spent counseling and coordinating care related to the above assessment and plan.  Signed by: Georgiann Cocker, DNP, FNP-BC Palliative Medicine    Please contact Palliative Medicine Team phone at [phone] for questions and concerns.  For individual provider: See Loretha Stapler

## 2023-05-20 NOTE — Consult Note (Signed)
[facility] Face-to-Face Psychiatry Consult   Reason for Consult:  Visual and auditory hallucinations for months Referring Physician:  Delfino Lovett, MD Patient Identification: [name] [name] MRN:  [id number] Principal Diagnosis: Upper GI bleed Diagnosis:  Principal Problem:   Upper GI bleed Active Problems:   Thrombocytopenia (HCC)   Pulmonary hypertension (HCC)   H/O mitral valve replacement   Anemia in chronic kidney disease   Focal seizures (HCC)   Carotid stenosis   PAF (paroxysmal atrial fibrillation) (HCC)   Hyperlipidemia   Chronic kidney disease (CKD) stage G3a/A2, moderately decreased glomerular filtration rate (GFR) between 45-59 mL/min/1.73 square meter and albuminuria creatinine ratio between 30-299 mg/g (HCC)   Right sided sciatica   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 female patient being seena  the request of the medical team due to auditory and visual hallucinations. Patient states,"they think I'm crazy.".  HPI:  Patient seen and chart reviewed. [name] [name], 82 year-old Caucasian female with history anxiety, depression is being seen at the request of the medical team due to visual and auditory hallucinations for several months.  Patient observed lying in bed awake on approach. On assessment patient is alert, and oriented to person, place, time, and situation.  She is calm and cooperative, mood congruent with affect. She is able to engage in appropriate conversation with clear and coherent speech. She reports experiencing auditory hallucinations, including hearing her children's voices, which she finds comforting, and other indistinct voices. She denies that the voices are derogatory or command in nature. She denies having visual hallucinations, suicidal or homicidal ideations, paranoia, or delusional thought content. No evidence of responding to  internal or external stimuli not present in the environment during the encounter. She denies alcohol use or illicit drug use. Patient reports that she currently resides in a retirement center in [place]. Patient reports she was married for 47 years, husband deceased.  She reports her late husband played the trumpet and she enjoys listening to trumpet music. She states she has 1 son and 1 daughter. She identifies a supportive relationship with her son and daughter, who she says provides mental support. Patient expresses agitation due to pain, specifically in her lower back. Nursing staff informed of patient's pain complaints.   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 Ladona Ridgel   Cataract    Left eye   Chronic kidney disease (CKD) stage G3a/A2, moderately decreased glomerular filtration rate (GFR) between 45-59 mL/min/1.73 square meter and albuminuria creatinine ratio between 30-299 mg/g (HCC) 11/13/2021   Closed bilateral fracture of pubic rami (HCC) 08/28/2022   Diverticulosis    Focal seizures (HCC)    Heart murmur    Hematuria,  gross 04/09/2018   Hemorrhoid    Hepatic cyst    innumerable   History of asbestos exposure    Hyperlipidemia    IBS (irritable bowel syndrome)    Idiopathic thrombocytopenic purpura (ITP) (HCC)    Migraine    MVP (mitral valve prolapse)    Near syncope 01/26/2023   Nodule of right lung     Osteoporosis of forearm    RBBB    Restrictive lung disease    Mild on PFT & likely cardiac in etiology    Right sided sciatica 04/10/2022   S/P Minimally invasive maze operation for atrial fibrillation 10/31/2015   Complete bilateral atrial lesion set using cryothermy and bipolar radiofrequency ablation with clipping of LA appendage via right mini thoracotomy approach   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   SVT (supraventricular tachycardia)    Thoracic aorta atherosclerosis (HCC)    TIA (transient ischemic attack)    Visit for preventive health examination 05/23/2016    Past Surgical History:  Procedure Laterality Date   BREAST EXCISIONAL BIOPSY Right Late 80s   Negative X2   CARDIAC CATHETERIZATION N/A 10/18/2015   Procedure: Right/Left Heart Cath and Coronary Angiography;  Surgeon: Tonny Bollman, MD;  Location: [facility] INVASIVE CV LAB;  Service: Cardiovascular;  Laterality: N/A;   CARDIOVERSION N/A 08/22/2015   Procedure: CARDIOVERSION;  Surgeon: Vesta Mixer, MD;  Location: [facility] ENDOSCOPY;  Service: Cardiovascular;  Laterality: N/A;   COLONOSCOPY  2003   EP IMPLANTABLE DEVICE N/A 11/06/2015   Procedure: Pacemaker Implant;  Surgeon: Marinus Maw, MD;  Location: [facility] INVASIVE CV LAB;  Service: Cardiovascular;  Laterality: N/A;   EP IMPLANTABLE DEVICE N/A 02/20/2016   Procedure: Lead Extraction;  Surgeon: Marinus Maw, MD;  Location: [facility] INVASIVE CV LAB;  Service: Cardiovascular;  Laterality: N/A;   EYE SURGERY Right 2013   FEMUR IM NAIL Left 06/18/2021   Procedure: INTRAMEDULLARY (IM) NAIL FEMORAL, OPEN REDUCTION INTERNAL FIXATION FEMUR RIGHT LITTLE FINGER PIP CLOSED REDUCTION;   Surgeon: Myrene Galas, MD;  Location: [facility] OR;  Service: Orthopedics;  Laterality: Left;   FOOT SURGERY     ~2007 right foot bunion   MANDIBLE FRACTURE SURGERY  03/26/2013   MINIMALLY INVASIVE MAZE PROCEDURE N/A 10/31/2015   Procedure: MINIMALLY INVASIVE MAZE PROCEDURE;  Surgeon: Purcell Nails, MD;  Location: [facility] OR;  Service: Open Heart Surgery;  Laterality: N/A;   MITRAL VALVE REPLACEMENT Right 10/31/2015   Procedure: MINIMALLY INVASIVE MITRAL VALVE (MV) REPLACEMENT;  Surgeon: Purcell Nails, MD;  Location: [facility] OR;  Service: Open Heart Surgery;  Laterality: Right;   PACEMAKER LEAD REMOVAL  02/20/2016   TEE WITH CARDIOVERSION     TEE WITHOUT CARDIOVERSION N/A 08/22/2015   Procedure: TRANSESOPHAGEAL ECHOCARDIOGRAM (TEE);  Surgeon: Vesta Mixer, MD;  Location: [facility] ENDOSCOPY;  Service: Cardiovascular;  Laterality: N/A;   TEE WITHOUT CARDIOVERSION N/A 10/31/2015   Procedure: TRANSESOPHAGEAL ECHOCARDIOGRAM (TEE);  Surgeon: Purcell Nails, MD;  Location: [facility] OR;  Service: Open Heart Surgery;  Laterality: N/A;   TUBAL LIGATION     VARICOSE VEIN SURGERY Right    Family History:  Family History  Problem Relation Age of Onset  Hypertension Mother    Arrhythmia Mother    Heart failure Mother    Arrhythmia Brother    Stroke Brother 43       cerebral hemorrhage, nonsmoker, no HTN   Prostate cancer Brother    Stroke Father        from an aneurysm   Stroke Maternal Aunt 85       cerebral hemorrhage   Liver cancer Maternal Grandmother    Atrial fibrillation Son    Celiac disease Son    Heart attack Neg Hx    Breast cancer Neg Hx    Family Psychiatric  History: History reviewed. No pertinent family psychiatric history.  Social History:  Social History   Substance and Sexual Activity  Alcohol Use No   Alcohol/week: 3.0 standard drinks of alcohol   Types: 3 Standard drinks or equivalent per week     Social History   Substance and Sexual Activity  Drug Use No    Social History    Socioeconomic History   Marital status: Widowed    Spouse name: Deceased   Number of children: 2   Years of education: 54   Highest education level: Master's degree (e.g., MA, MS, MEng, MEd, MSW, MBA)  Occupational History   Occupation: Retired  Tobacco Use   Smoking status: Never   Smokeless tobacco: Never  Vaping Use   Vaping Use: Never used  Substance and Sexual Activity   Alcohol use: No    Alcohol/week: 3.0 standard drinks of alcohol    Types: 3 Standard drinks or equivalent per week   Drug use: No   Sexual activity: Not Currently  Other Topics Concern   Not on file  Social History Narrative   ** Merged History Encounter **       She is a widow.  Husband died from metastatic renal cell cancer. Originally from [place]. Previously lived in [place] from 1975-[place]. Moved to [place] in [place]. No mold exposure recently but did have it through a prior work exposure in 1993. Has a masters in    public health. No bird exposure.  [facility] (09/29/17): She has moved into a retirement community since last appointment. She reports she had testing at her new residence that was positive for mold. It has since been treated.    Social Determinants of Health   Financial Resource Strain: Low Risk  (03/17/2023)   Overall Financial Resource Strain (CARDIA)    Difficulty of Paying Living Expenses: Not hard at all  Food Insecurity: No Food Insecurity (05/17/2023)   Hunger Vital Sign    Worried About Running Out of Food in the Last Year: Never true    Ran Out of Food in the Last Year: Never true  Transportation Needs: No Transportation Needs (05/17/2023)   PRAPARE - Administrator, Civil Service (Medical): No    Lack of Transportation (Non-Medical): No  Physical Activity: Unknown (03/17/2023)   Exercise Vital Sign    Days of Exercise per Week: Patient declined    Minutes of Exercise per Session: Not on file  Stress: No Stress Concern Present (03/17/2023)   [employer]  of Occupational Health - Occupational Stress Questionnaire    Feeling of Stress : Only a little  Social Connections: Unknown (03/17/2023)   Social Connection and Isolation Panel [NHANES]    Frequency of Communication with Friends and Family: Patient declined    Frequency of Social Gatherings with Friends and Family: Once a week    Attends  Religious Services: Patient declined    Active Member of Clubs or Organizations: Yes    Attends Banker Meetings: Patient declined    Marital Status: Widowed   Additional Social History:    Allergies:   Allergies  Allergen Reactions   Codeine Nausea And Vomiting and Other (See Comments)    migraine   Epinephrine Palpitations and Shortness Of Breath   Hydrocodone Nausea And Vomiting and Other (See Comments)    MIGRAINE   Hydromorphone Nausea And Vomiting and Other (See Comments)    migraine   Molds & Smuts Anxiety, Other (See Comments) and Shortness Of Breath   Oxycodone Nausea And Vomiting and Other (See Comments)    Severe migraine   Meloxicam Other (See Comments)    Severe reflux   Codeine Other (See Comments)    Migraine   Dextromethorphan Other (See Comments)    seizures   Diphen [Diphenhydramine Hcl] Other (See Comments)    seizure   Diphenhydramine Other (See Comments)    seizure   Diphenhydramine Hcl     Other reaction(s): Other (See Comments)   Diphenylpyraline Other (See Comments)   Doxycycline Itching, Swelling and Other (See Comments)    Facial   Doxycycline Itching and Swelling   Hydrocodone Other (See Comments)    Migraine   Hydromorphone Other (See Comments)    Migraine   Meloxicam Other (See Comments)   Mobic [Meloxicam] Other (See Comments)    reflux   Nsaids    Nsaids Other (See Comments)    Pt on blood thinner   Oxycodone Other (See Comments)    Migraine   Propofol Other (See Comments)   Propofol Other (See Comments)    Very sensitive; patient stated she was told she was told she had apnea     Labs:  Results for orders placed or performed during the hospital encounter of 05/17/23 (from the past 48 hour(s))  Glucose, capillary     Status: Abnormal   Collection Time: 05/18/23  9:15 PM  Result Value Ref Range   Glucose-Capillary 116 (H) 70 - 99 mg/dL    Comment: Glucose reference range applies only to samples taken after fasting for at least 8 hours.  CBC     Status: Abnormal   Collection Time: 05/19/23  4:10 AM  Result Value Ref Range   WBC 4.9 4.0 - 10.5 K/uL   RBC 2.83 (L) 3.87 - 5.11 MIL/uL   Hemoglobin 8.3 (L) 12.0 - 15.0 g/dL   HCT 16.1 (L) 09.6 - 04.5 %   MCV 95.4 80.0 - 100.0 fL   MCH 29.3 26.0 - 34.0 pg   MCHC 30.7 30.0 - 36.0 g/dL   RDW 40.9 (H) 81.1 - 91.4 %   Platelets 111 (L) 150 - 400 K/uL   nRBC 0.0 0.0 - 0.2 %    Comment: Performed at [facility], [address] [place], [place] [address]  Basic metabolic panel     Status: Abnormal   Collection Time: 05/19/23  4:10 AM  Result Value Ref Range   Sodium 137 135 - 145 mmol/L   Potassium 3.5 3.5 - 5.1 mmol/L   Chloride 103 98 - 111 mmol/L   CO2 25 22 - 32 mmol/L   Glucose, Bld 95 70 - 99 mg/dL    Comment: Glucose reference range applies only to samples taken after fasting for at least 8 hours.   BUN 19 8 - 23 mg/dL   Creatinine, Ser 6.21 0.44 - 1.00  mg/dL   Calcium 8.7 (L) 8.9 - 10.3 mg/dL   GFR, Estimated 59 (L) >60 mL/min    Comment: (NOTE) Calculated using the CKD-EPI Creatinine Equation (2021)    Anion gap 9 5 - 15    Comment: Performed at [facility], [address] [place], [place] [address]  Magnesium     Status: None   Collection Time: 05/19/23  4:10 AM  Result Value Ref Range   Magnesium 2.2 1.7 - 2.4 mg/dL    Comment: Performed at [facility], [address] [place], [place] [address]  Glucose, capillary     Status: None   Collection Time: 05/19/23  9:00 AM  Result Value Ref Range   Glucose-Capillary 93 70 - 99 mg/dL    Comment: Glucose reference  range applies only to samples taken after fasting for at least 8 hours.  Glucose, capillary     Status: None   Collection Time: 05/19/23 12:23 PM  Result Value Ref Range   Glucose-Capillary 99 70 - 99 mg/dL    Comment: Glucose reference range applies only to samples taken after fasting for at least 8 hours.  Glucose, capillary     Status: None   Collection Time: 05/19/23  5:26 PM  Result Value Ref Range   Glucose-Capillary 95 70 - 99 mg/dL    Comment: Glucose reference range applies only to samples taken after fasting for at least 8 hours.  CBC     Status: Abnormal   Collection Time: 05/20/23  3:30 AM  Result Value Ref Range   WBC 4.1 4.0 - 10.5 K/uL   RBC 2.59 (L) 3.87 - 5.11 MIL/uL   Hemoglobin 7.7 (L) 12.0 - 15.0 g/dL   HCT 57.8 (L) 46.9 - 62.9 %   MCV 95.4 80.0 - 100.0 fL   MCH 29.7 26.0 - 34.0 pg   MCHC 31.2 30.0 - 36.0 g/dL   RDW 52.8 (H) 41.3 - 24.4 %   Platelets 109 (L) 150 - 400 K/uL   nRBC 0.0 0.0 - 0.2 %    Comment: Performed at [facility], [address], [place], [place] [address]  Basic metabolic panel     Status: Abnormal   Collection Time: 05/20/23  3:30 AM  Result Value Ref Range   Sodium 136 135 - 145 mmol/L   Potassium 3.6 3.5 - 5.1 mmol/L   Chloride 104 98 - 111 mmol/L   CO2 26 22 - 32 mmol/L   Glucose, Bld 84 70 - 99 mg/dL    Comment: Glucose reference range applies only to samples taken after fasting for at least 8 hours.   BUN 18 8 - 23 mg/dL   Creatinine, Ser 2.53 0.44 - 1.00 mg/dL   Calcium 8.3 (L) 8.9 - 10.3 mg/dL   GFR, Estimated >66 >44 mL/min    Comment: (NOTE) Calculated using the CKD-EPI Creatinine Equation (2021)    Anion gap 6 5 - 15    Comment: Performed at [facility], [address] [place], [place] [address]   *Note: Due to a large number of results and/or encounters for the requested time period, some results have not been displayed. A complete set of results can be found in Results Review.    Current  Facility-Administered Medications  Medication Dose Route Frequency Provider Last Rate Last Admin   0.9 %  sodium chloride infusion   Intravenous PRN Delfino Lovett, MD 10 mL/hr at 05/20/23 0500 New Bag at 05/20/23 0500  acetaminophen (TYLENOL) tablet 650 mg  650 mg Oral Q6H PRN Midge Minium, MD       Or   acetaminophen (TYLENOL) suppository 650 mg  650 mg Rectal Q6H PRN Midge Minium, MD       albuterol (PROVENTIL) (2.5 MG/3ML) 0.083% nebulizer solution 2.5 mg  2.5 mg Inhalation Q6H PRN Midge Minium, MD       ampicillin (OMNIPEN) 1 g in sodium chloride 0.9 % 100 mL IVPB  1 g Intravenous Q8H Wohl, Darren, MD 300 mL/hr at 05/20/23 0500 1 g at 05/20/23 0500   ARIPiprazole (ABILIFY) tablet 10 mg  10 mg Oral QPM Midge Minium, MD   10 mg at 05/19/23 1836   Chlorhexidine Gluconate Cloth 2 % PADS 6 each  6 each Topical Daily Midge Minium, MD   6 each at 05/19/23 0759   gabapentin (NEURONTIN) capsule 200 mg  200 mg Oral TID Midge Minium, MD   200 mg at 05/19/23 2140   lamoTRIgine (LAMICTAL) tablet 150 mg  150 mg Oral q morning Midge Minium, MD   150 mg at 05/19/23 0754   lamoTRIgine (LAMICTAL) tablet 200 mg  200 mg Oral QHS Midge Minium, MD   200 mg at 05/19/23 2140   metaxalone (SKELAXIN) tablet 800 mg  800 mg Oral TID PRN Midge Minium, MD       metoprolol tartrate (LOPRESSOR) tablet 25 mg  25 mg Oral Daily Midge Minium, MD   25 mg at 05/19/23 0755   montelukast (SINGULAIR) tablet 10 mg  10 mg Oral QHS Midge Minium, MD   10 mg at 05/19/23 2140   ondansetron (ZOFRAN) tablet 4 mg  4 mg Oral Q6H PRN Midge Minium, MD       Or   ondansetron [facility]) injection 4 mg  4 mg Intravenous Q6H PRN Midge Minium, MD   4 mg at 05/19/23 1114   pantoprazole (PROTONIX) EC tablet 40 mg  40 mg Oral BID AC Shah, Vipul, MD       polyethylene glycol (MIRALAX / GLYCOLAX) packet 17 g  17 g Oral Daily PRN Midge Minium, MD       QUEtiapine (SEROQUEL) tablet 25 mg  25 mg Oral QHS Midge Minium, MD   25 mg at 05/19/23 2140    rosuvastatin (CRESTOR) tablet 40 mg  40 mg Oral QHS Midge Minium, MD   40 mg at 05/19/23 2140   senna-docusate (Senokot-S) tablet 1 tablet  1 tablet Oral QHS PRN Midge Minium, MD        Musculoskeletal: Strength & Muscle Tone: decreased Gait & Station:  Did not assess  Patient leans: N/A            Psychiatric Specialty Exam:  Presentation  General Appearance:  Disheveled  Eye Contact: Minimal  Speech: Clear and Coherent  Speech Volume: Decreased  Handedness: Right   Mood and Affect  Mood: Depressed  Affect: Depressed   Thought Process  Thought Processes: Goal Directed  Descriptions of Associations:Intact  Orientation:Full (Time, Place and Person)  Thought Content:Logical  History of Schizophrenia/Schizoaffective disorder:No data recorded Duration of Psychotic Symptoms:No data recorded Hallucinations:No data recorded Ideas of Reference:None  Suicidal Thoughts:No data recorded Homicidal Thoughts:No data recorded  Sensorium  Memory: Immediate Good; Recent Good; Remote Good  Judgment: Good  Insight: Good   Executive Functions  Concentration: Good  Attention Span: Good  Recall: Good  Fund of Knowledge: Good  Language: Good   Psychomotor Activity  Psychomotor Activity:No data recorded  Assets  Assets:  Financial Resources/Insurance; Housing; Social Support   Sleep  Sleep:No data recorded  Physical Exam: Physical Exam Vitals and nursing note reviewed.  HENT:     Head: Normocephalic.     Nose: Nose normal.  Cardiovascular:     Rate and Rhythm: Normal rate.  Pulmonary:     Effort: Pulmonary effort is normal.  Musculoskeletal:        General: Normal range of motion.     Cervical back: Normal range of motion.     Comments: Generalized weakness   Neurological:     Mental Status: She is alert and oriented to person, place, and time.  Psychiatric:        Attention and Perception: Attention normal. She perceives  auditory hallucinations. She does not perceive visual hallucinations.        Mood and Affect: Mood and affect normal.        Speech: Speech normal.        Behavior: Behavior is slowed. Behavior is cooperative.        Thought Content: Thought content is not paranoid or delusional. Thought content does not include homicidal or suicidal ideation.        Cognition and Memory: Cognition and memory normal.        Judgment: Judgment normal.    ROS Blood pressure (!) 97/53, pulse 77, temperature 98.2 F (36.8 C), temperature source Oral, resp. rate 18, height 5\' 5"  (1.651 m), weight 62 kg, SpO2 93 %. Body mass index is 22.75 kg/m.  Treatment Plan Summary: Plan : 82 y.o. female seen at the request of the medical team for visual and auditory hallucinations. Patient reported experiencing auditory hallucinations, hearing her children's voices, which she finds comforting.  She is not homicidal or suicidal, nor is there evidence of acute psychosis at this time. She does not meet criteria for a psychiatric inpatient admission.  Plan reviewed with attending provider, Dr. Sherryll Burger.   Disposition: No evidence of imminent risk to self or others at present.   Patient does not meet criteria for psychiatric inpatient admission.  Norma Fredrickson, NP 05/20/2023 8:37 AM

## 2023-05-20 NOTE — Discharge Summary (Signed)
Physician Discharge Summary   Patient: [name] [name] MRN: [id number] DOB: 1940/12/11  Admit date:     05/17/2023  Discharge date: 05/20/23  Discharge Physician: Delfino Lovett   PCP: Sherlene Shams, MD   Recommendations at discharge:   Follow-up with outpatient providers as requested  Discharge Diagnoses: Principal Problem:   Upper GI bleed Active Problems:   Thrombocytopenia (HCC)   Pulmonary hypertension (HCC)   H/O mitral valve replacement   Anemia in chronic kidney disease   Focal seizures (HCC)   Carotid stenosis   PAF (paroxysmal atrial fibrillation) (HCC)   Hyperlipidemia   Chronic kidney disease (CKD) stage G3a/A2, moderately decreased glomerular filtration rate (GFR) between 45-59 mL/min/1.73 square meter and albuminuria creatinine ratio between 30-299 mg/g (HCC)   Right sided sciatica   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 is an 82 year old female with paroxysmal atrial fibrillation, on Eliquis, GERD, who presents to the emergency department for chief concerns of coffee-ground emesis, distended abdomen. Hemoglobin is low, but stable, placed on Protonix.  6/17: EGD today, had bilious vomit earlier    Assessment and Plan:  * Upper GI bleed Anemia in chronic kidney disease Chronic thrombocytopenia. Patient had coffee-ground emesis at time of admission.  EGD on 6/17 within normal limit.  After discussion with patient's son about risks and benefit of Eliquis. She has not had any further GI bleed and her H&H are stable.  She is tolerating diet.   Psychosis with auditory hallucination. Continue Abilify CT head -left parietal scalp hematoma without any fracture   E faecalis UTI Urine culture growing Enterococcus faecalis.  Treated while in the hospital   Chronic kidney disease (CKD) stage G3a/A2, moderately decreased glomerular filtration rate (GFR) between 45-59 mL/min/1.73 square meter  and albuminuria creatinine ratio between 30-299 mg/g (HCC) At baseline   PAF (paroxysmal atrial fibrillation) (HCC) Eliquis stopped after discussion with son about risks and benefit. Decision was made to stop Eliquis considering she has had multiple falls and remains at high risk for ongoing falls.  Her head CT showed left parietal scalp hematoma on this admission.    Focal seizures (HCC) Continue home medicines.   Goals of care Overall poor prognosis.  She is DNR.  Son is in agreement with outpatient palliative care evaluation and transition to hospice if he qualifies      Consultants: GI Procedures performed: EGD on 6/17 Disposition: Long term care facility/[facility] memory care unit with palliative care to follow Diet recommendation:  Discharge Diet Orders (From admission, onward)     Start     Ordered   05/20/23 0000  Diet - low sodium heart healthy        05/20/23 1114           Carb modified diet DISCHARGE MEDICATION: Allergies as of 05/20/2023       Reactions   Codeine Nausea And Vomiting, Other (See Comments)   migraine   Epinephrine Palpitations, Shortness Of Breath   Hydrocodone Nausea And Vomiting, Other (See Comments)   MIGRAINE   Hydromorphone Nausea And Vomiting, Other (See Comments)   migraine   Molds & Smuts Anxiety, Other (See Comments), Shortness Of Breath   Oxycodone Nausea And Vomiting, Other (See Comments)   Severe migraine   Meloxicam Other (See Comments)   Severe reflux   Codeine Other (See Comments)   Migraine   Dextromethorphan Other (See Comments)   seizures   Diphen [  diphenhydramine Hcl] Other (See Comments)   seizure   Diphenhydramine Other (See Comments)   seizure   Diphenhydramine Hcl    Other reaction(s): Other (See Comments)   Diphenylpyraline Other (See Comments)   Doxycycline Itching, Swelling, Other (See Comments)   Facial   Doxycycline Itching, Swelling   Hydrocodone Other (See Comments)   Migraine   Hydromorphone  Other (See Comments)   Migraine   Meloxicam Other (See Comments)   Mobic [meloxicam] Other (See Comments)   reflux   Nsaids    Nsaids Other (See Comments)   Pt on blood thinner   Oxycodone Other (See Comments)   Migraine   Propofol Other (See Comments)   Propofol Other (See Comments)   Very sensitive; patient stated she was told she was told she had apnea        Medication List     STOP taking these medications    Eliquis 5 MG Tabs tablet Generic drug: apixaban       TAKE these medications    acetaminophen 325 MG tablet Commonly known as: TYLENOL Take 650 mg by mouth every 4 (four) hours as needed for mild pain or moderate pain.   ARIPiprazole 10 MG tablet Commonly known as: ABILIFY Take 10 mg by mouth every evening.   brivaracetam 25 MG Tabs tablet Commonly known as: BRIVIACT Take 25 mg by mouth daily.   furosemide 40 MG tablet Commonly known as: LASIX Take 40 mg by mouth daily as needed for fluid or edema.   gabapentin 100 MG capsule Commonly known as: NEURONTIN Take 200 mg by mouth 3 (three) times daily.   lamoTRIgine 200 MG tablet Commonly known as: LAMICTAL Take 200 mg by mouth at bedtime.   lamoTRIgine 150 MG tablet Commonly known as: LAMICTAL Take 150 mg by mouth daily.   metaxalone 800 MG tablet Commonly known as: SKELAXIN Take 800 mg by mouth 3 (three) times daily as needed for muscle spasms.   metoprolol tartrate 25 MG tablet Commonly known as: LOPRESSOR Take 25 mg by mouth daily.   midodrine 2.5 MG tablet Commonly known as: PROAMATINE Take 2.5 mg by mouth 3 (three) times daily with meals.   montelukast 10 MG tablet Commonly known as: SINGULAIR TAKE ONE TABLET BY MOUTH AT BEDTIME   ondansetron 8 MG tablet Commonly known as: ZOFRAN Take 8 mg by mouth every 8 (eight) hours as needed for nausea or vomiting.   pantoprazole 40 MG tablet Commonly known as: PROTONIX Take 1 tablet (40 mg total) by mouth 2 (two) times daily before a  meal.   polyethylene glycol 17 g packet Commonly known as: MIRALAX / GLYCOLAX Take 17 g by mouth daily as needed for mild constipation or moderate constipation.   rosuvastatin 40 MG tablet Commonly known as: CRESTOR Take 1 tablet (40 mg total) by mouth daily. What changed: when to take this   Ventolin HFA 108 (90 Base) MCG/ACT inhaler Generic drug: albuterol INHALE 2 PUFFS INTO THE LUNGS EVERY 6 HOURS AS NEEDED FOR WHEEZING OR SHORTNESS OF BREATH        Follow-up Information     Sherlene Shams, MD. Schedule an appointment as soon as possible for a visit in 1 week(s).   Specialty: Internal Medicine Why: [facility] Discharge F/UP Contact information: [address] [place] [address] [phone]                Discharge Exam: Ceasar Mons Weights   05/17/23 1104 05/19/23 1319  Weight:  62 kg 62 kg   General exam: Appears calm and comfortable  Respiratory system: Clear to auscultation. Respiratory effort normal. Cardiovascular system: S1 & S2 heard, RRR. No JVD, murmurs, rubs, gallops or clicks. No pedal edema. Gastrointestinal system: Abdomen is nondistended, soft and nontender. No organomegaly or masses felt. Normal bowel sounds heard. Central nervous system: Alert and oriented x1. No focal neurological deficits. Extremities: Symmetric 5 x 5 power. Skin: No rashes, lesions or ulcers Psychiatry: Normal mood and affect   Condition at discharge: fair  The results of significant diagnostics from this hospitalization (including imaging, microbiology, ancillary and laboratory) are listed below for reference.   Imaging Studies: CT HEAD WO CONTRAST ( )  Result Date: 05/18/2023 CLINICAL DATA:  Delirium. EXAM: CT HEAD WITHOUT CONTRAST TECHNIQUE: Contiguous axial images were obtained from the base of the skull through the vertex without intravenous contrast. RADIATION DOSE REDUCTION: This exam was performed according to the departmental dose-optimization  program which includes automated exposure control, adjustment of the mA and/or kV according to patient size and/or use of iterative reconstruction technique. COMPARISON:  CT head without contrast 04/29/2023 FINDINGS: Brain: Mild atrophy and white matter changes are similar the prior study. No acute infarct, hemorrhage, or mass lesion is present. Deep brain nuclei are within normal limits. The ventricles are of normal size. No significant extraaxial fluid collection is present. The brainstem and cerebellum are within normal limits. Midline structures are within normal limits. Vascular: Atherosclerotic calcifications are present within the cavernous internal carotid arteries bilaterally. No hyperdense vessel is present. Skull: A left parietal scalp hematoma is present. Underlying fracture or foreign body is present. Sinuses/Orbits: The paranasal sinuses and mastoid air cells are clear. Right lens replacement is present. Globes and orbits are otherwise within normal limits. IMPRESSION: 1. Left parietal scalp hematoma without underlying fracture or foreign body. 2. Stable mild atrophy and white matter disease. This likely reflects the sequela of chronic microvascular ischemia. 3. No acute intracranial abnormality or significant interval change. Electronically Signed   By: Marin Roberts M.D.   On: 05/18/2023 17:45   CT ABDOMEN PELVIS W CONTRAST  Result Date: 05/17/2023 CLINICAL DATA:  82 year old female with abdominal pain, falls, bruising to the abdomen and back. EXAM: CT ABDOMEN AND PELVIS WITH CONTRAST TECHNIQUE: Multidetector CT imaging of the abdomen and pelvis was performed using the standard protocol following bolus administration of intravenous contrast. RADIATION DOSE REDUCTION: This exam was performed according to the departmental dose-optimization program which includes automated exposure control, adjustment of the mA and/or kV according to patient size and/or use of iterative reconstruction  technique. CONTRAST:  OMNIPAQUE IOHEXOL 300 MG/ML  SOLN COMPARISON:  CT Abdomen and Pelvis 04/23/2023. FINDINGS: Lower chest: Partially visible cardiomegaly appears stable. No pericardial or pleural effusion. Partially visible cardiac pacer lead. Hepatobiliary: Numerous chronic hepatic cysts have not significantly changed since 2017 and are benign (no follow-up imaging recommended). Diminutive, negative gallbladder. Pancreas: Within normal limits. Spleen: Negative. Adrenals/Urinary Tract: Normal adrenal glands. Nonobstructed kidneys. Occasional chronic renal cysts also present in 2017 (no follow-up imaging recommended). Symmetric renal enhancement and contrast excretion with no hydronephrosis or hydroureter. But the urinary bladder is markedly distended today (sagittal image 63). Estimated bladder volume 920 mL. Stomach/Bowel: No dilated large or small bowel. Retained stool throughout the colon. Normal appendix on series 2, image 43. Decompressed stomach. No free air or free fluid. Vascular/Lymphatic: Extensive Aortoiliac calcified atherosclerosis. Major arterial structures remain patent. No lymphadenopathy. Early portal venous timing on the initial phase, on the delayed  images the main portal venous structures appear to be patent. Reproductive: Negative. Other: No pelvic free fluid. Musculoskeletal: Moderate to severe dextroconvex lumbar scoliosis and widespread lumbar, lower thoracic spine degeneration. Chronic lower lumbar spondylolisthesis. Chronic right posterior rib fractures appear stable. But there is a non healed transverse fracture of the sacrum through the S3 level (sagittal images 56 through 61) which is now minimally displaced, but likely subacute. Superimposed bilateral sacral ala insufficiency fractures with sclerosis, stable. SI joints remain intact. Chronic fractures of the right pubic rami, bordering the anterior right acetabulum again noted. Previous left femur ORIF. IMPRESSION: 1. A  transverse fracture through the Sacrum at S3 is mildly displaced, but appears subacute, and is superimposed on multiple chronic sacral and pelvic fractures. Query associated S3 to S5 sacral radiculopathy. 2. Marked urinary bladder distension (920 mL). Query urinary retention. 3. No other acute or inflammatory process identified in the abdomen or pelvis. Aortic Atherosclerosis (ICD10-I70.0). Electronically Signed   By: Odessa Fleming M.D.   On: 05/17/2023 13:04   DG Chest Port 1 View  Result Date: 04/29/2023 CLINICAL DATA:  [id number] with altered mental status, unresponsive at [facility] today. EXAM: PORTABLE CHEST 1 VIEW COMPARISON:  Portable chest 04/23/2023 FINDINGS: Aagain noted left chest dual lead pacing system with stable wire insertions, left atrial appendage closure device and metallic mitral valve prosthesis. Moderate to severe cardiomegaly is also again seen with the interval development of central vascular prominence and interstitial edema. There are perihilar alveolar opacities which could be due to alveolar edema, pneumonia or combination. Small pleural effusions are forming but no substantial pleural effusion at this time. The mediastinum is stable with aortic tortuosity and calcific plaques. Osteopenia and thoracic spondylosis. IMPRESSION: 1. Moderate to severe cardiomegaly with central vascular prominence and interstitial edema, consistent with CHF or fluid overload. 2. Perihilar alveolar opacities which could be due to alveolar edema, pneumonia or combination. 3. Small pleural effusions. 4. Aortic atherosclerosis and uncoiling. Electronically Signed   By: Almira Bar M.D.   On: 04/29/2023 20:17   EEG adult  Result Date: 04/29/2023 Charlsie Quest, MD     04/29/2023  5:15 PM Patient Name: OSMARY SCHLEIFER MRN: [id number] Epilepsy Attending: Charlsie Quest Referring Physician/Provider: Gordy Councilman, MD Date: 04/29/2023 Duration: 26.58 mins Patient history: 82yo F after an episode of  unresponsiveness. EEG to evaluate for seizure Level of alertness: Awake, asleep AEDs during EEG study: None Technical aspects: This EEG study was done with scalp electrodes positioned according to the 10-20 International system of electrode placement. Electrical activity was reviewed with band pass filter of 1-70Hz , sensitivity of 7 uV/mm, display speed of 95mm/sec with a 60Hz  notched filter applied as appropriate. EEG data were recorded continuously and digitally stored.  Video monitoring was available and reviewed as appropriate. Description: The posterior dominant rhythm consists of 8-9 Hz activity of moderate voltage (25-35 uV) seen predominantly in posterior head regions, symmetric and reactive to eye opening and eye closing. Sleep was characterized by vertex waves, sleep spindles (12 to 14 Hz), maximal frontocentral region. Hyperventilation and photic stimulation were not performed.   IMPRESSION: This study is within normal limits. No seizures or epileptiform discharges were seen throughout the recording. A normal interictal EEG does not exclude the diagnosis of epilepsy. Charlsie Quest   CT Head Wo Contrast  Result Date: 04/29/2023 CLINICAL DATA:  Delirium, found down EXAM: CT HEAD WITHOUT CONTRAST TECHNIQUE: Contiguous axial images were obtained from the base of the skull through the  vertex without intravenous contrast. RADIATION DOSE REDUCTION: This exam was performed according to the departmental dose-optimization program which includes automated exposure control, adjustment of the mA and/or kV according to patient size and/or use of iterative reconstruction technique. COMPARISON:  04/23/2023 FINDINGS: Brain: Normal anatomic configuration. Parenchymal volume is relatively well preserved given the patient's age. Mild periventricular white matter changes are present likely reflecting the sequela of small vessel ischemia. No abnormal intra or extra-axial mass lesion or fluid collection. No abnormal mass  effect or midline shift. No evidence of acute intracranial hemorrhage or infarct. Ventricular size is normal. Cerebellum unremarkable. Vascular: No asymmetric hyperdense vasculature at the skull base. Skull: Intact Sinuses/Orbits: Paranasal sinuses are clear. Orbits are unremarkable. Other: Mastoid air cells and middle ear cavities are clear. IMPRESSION: 1. No acute intracranial hemorrhage or infarct. 2. Mild senescent change. Electronically Signed   By: Helyn Numbers M.D.   On: 04/29/2023 15:35   CT ABDOMEN PELVIS W CONTRAST  Result Date: 04/23/2023 CLINICAL DATA:  Nausea and vomiting.  Diffuse abdominal pain EXAM: CT ABDOMEN AND PELVIS WITH CONTRAST TECHNIQUE: Multidetector CT imaging of the abdomen and pelvis was performed using the standard protocol following bolus administration of intravenous contrast. RADIATION DOSE REDUCTION: This exam was performed according to the departmental dose-optimization program which includes automated exposure control, adjustment of the mA and/or kV according to patient size and/or use of iterative reconstruction technique. CONTRAST:  75mL OMNIPAQUE IOHEXOL 300 MG/ML  SOLN COMPARISON:  01/26/2023 FINDINGS: Lower chest: Cardiomegaly with right ventricular pacer lead and mitral valve replacement, partially covered. No acute finding in the lower chest Hepatobiliary: Innumerable liver cysts accentuated around the gallbladder fossa. No evidence of biliary calcification or obstruction. Pancreas: Unremarkable. Spleen: Unremarkable. Adrenals/Urinary Tract: Negative adrenals. No hydronephrosis or stone. 3 cm right renal cyst. No follow-up imaging is recommended given simple appearance. Unremarkable bladder. Stomach/Bowel: No obstruction. Generalized stool desiccation. No evidence of bowel inflammation. Vascular/Lymphatic: No acute vascular abnormality. Scattered atheromatous calcification. No mass or adenopathy. Reproductive:No pathologic findings. Other: No ascites or  pneumoperitoneum. Shallow right groin hernia containing trace ascitic fluid. Musculoskeletal: Remote right lower rib fractures. Chronic sacral insufficiency fractures with bilateral and midline sclerosis. Chronic right sacral ala and right pubic body fractures with incomplete bony bridging at the ala. Postoperative proximal left femur. Lumbar spine degeneration is severe with pronounced dextroscoliosis. IMPRESSION: 1. No acute finding. 2. Numerous chronic findings are listed above. Electronically Signed   By: Tiburcio Pea M.D.   On: 04/23/2023 05:44   DG Chest Portable 1 View  Result Date: 04/23/2023 CLINICAL DATA:  Altered, bizarre behavior EXAM: PORTABLE CHEST 1 VIEW COMPARISON:  Chest radiograph 03/18/2023 FINDINGS: Stable cardiomegaly. Left atrial appendage occlusion device. AVR. Left chest wall pacemaker. Chronic interstitial coarsening. Chronic scar right mid lung. No pleural effusion or pneumothorax. No displaced rib fractures. IMPRESSION: No active disease. Cardiomegaly and chronic interstitial coarsening. Electronically Signed   By: Minerva Fester M.D.   On: 04/23/2023 02:02   CT HEAD WO CONTRAST ( )  Result Date: 04/23/2023 CLINICAL DATA:  Bizarre behavior on Eliquis. EXAM: CT HEAD WITHOUT CONTRAST TECHNIQUE: Contiguous axial images were obtained from the base of the skull through the vertex without intravenous contrast. RADIATION DOSE REDUCTION: This exam was performed according to the departmental dose-optimization program which includes automated exposure control, adjustment of the mA and/or kV according to patient size and/or use of iterative reconstruction technique. COMPARISON:  CT 04/19/2023 FINDINGS: Brain: No intracranial hemorrhage, mass effect, or evidence of acute infarct. No hydrocephalus.  No extra-axial fluid collection. Mild generalized cerebral atrophy. Vascular: No hyperdense vessel. Intracranial arterial calcification. Skull: No fracture or focal lesion. Sinuses/Orbits: No  acute finding. Frothy mucous in the left sphenoid sinus. Paranasal sinuses and mastoid air cells are otherwise well aerated. Other: None. IMPRESSION: 1. No acute intracranial abnormality. Electronically Signed   By: Minerva Fester M.D.   On: 04/23/2023 01:46    Microbiology: Results for orders placed or performed during the hospital encounter of 05/17/23  Urine Culture     Status: Abnormal   Collection Time: 05/17/23  1:53 PM   Specimen: Urine, Random  Result Value Ref Range Status   Specimen Description   Final    URINE, RANDOM Performed at [facility], [address] [place], [place] [address]    Special Requests   Final    NONE Reflexed from [id number] Performed at [facility], [address] [place], [place] [address]    Culture >=100,000 COLONIES/mL ENTEROCOCCUS FAECALIS (A)  Final   Report Status 05/19/2023 FINAL  Final   Organism ID, Bacteria ENTEROCOCCUS FAECALIS (A)  Final      Susceptibility   Enterococcus faecalis - MIC*    AMPICILLIN <=2 SENSITIVE Sensitive     NITROFURANTOIN <=16 SENSITIVE Sensitive     VANCOMYCIN 1 SENSITIVE Sensitive     * >=100,000 COLONIES/mL ENTEROCOCCUS FAECALIS  Blood culture (routine x 2)     Status: None (Preliminary result)   Collection Time: 05/17/23  2:23 PM   Specimen: BLOOD RIGHT HAND  Result Value Ref Range Status   Specimen Description BLOOD RIGHT HAND  Final   Special Requests   Final    BOTTLES DRAWN AEROBIC AND ANAEROBIC Blood Culture adequate volume   Culture   Final    NO GROWTH 3 DAYS Performed at [facility], [address] [place], [place] [address]    Report Status PENDING  Incomplete  Blood culture (routine x 2)     Status: None (Preliminary result)   Collection Time: 05/17/23  2:28 PM   Specimen: BLOOD  Result Value Ref Range Status   Specimen Description BLOOD RIGHT ANTECUBITAL  Final   Special Requests   Final    BOTTLES DRAWN AEROBIC AND ANAEROBIC Blood Culture results may not be  optimal due to an excessive volume of blood received in culture bottles   Culture   Final    NO GROWTH 3 DAYS Performed at [facility], [address], [place], [place] [address]    Report Status PENDING  Incomplete   *Note: Due to a large number of results and/or encounters for the requested time period, some results have not been displayed. A complete set of results can be found in Results Review.    Labs: CBC: Recent Labs  Lab 05/17/23 1113 05/18/23 0418 05/19/23 0410 05/20/23 0330  WBC 5.3 4.7 4.9 4.1  HGB 8.8* 8.1* 8.3* 7.7*  HCT 28.3* 25.7* 27.0* 24.7*  MCV 96.3 94.8 95.4 95.4  PLT 126* 116* 111* 109*   Basic Metabolic Panel: Recent Labs  Lab 05/17/23 1201 05/18/23 0418 05/19/23 0410 05/20/23 0330  NA 136 137 137 136  K 3.7 3.6 3.5 3.6  CL 100 103 103 104  CO2 26 25 25 26   GLUCOSE 100* 81 95 84  BUN 28* 23 19 18   CREATININE 1.13* 0.94 0.96 0.87  CALCIUM 9.2 8.7* 8.7* 8.3*  MG  --   --  2.2  --    Liver Function Tests: Recent  Labs  Lab 05/17/23 1201  AST 47*  ALT 26  ALKPHOS 123  BILITOT 1.5*  PROT 6.8  ALBUMIN 4.3   CBG: Recent Labs  Lab 05/18/23 2115 05/19/23 0900 05/19/23 1223 05/19/23 1726  GLUCAP 116* 93 99 95    Discharge time spent: greater than 30 minutes.  Signed: Delfino Lovett, MD [facility] 05/20/2023

## 2023-05-21 ENCOUNTER — Encounter: Payer: Self-pay | Admitting: Student

## 2023-05-21 ENCOUNTER — Telehealth: Payer: Self-pay

## 2023-05-21 ENCOUNTER — Other Ambulatory Visit: Payer: Self-pay | Admitting: Internal Medicine

## 2023-05-21 ENCOUNTER — Telehealth: Payer: Self-pay | Admitting: Cardiovascular Disease

## 2023-05-21 ENCOUNTER — Non-Acute Institutional Stay: Payer: Medicare Other | Admitting: Student

## 2023-05-21 DIAGNOSIS — R296 Repeated falls: Secondary | ICD-10-CM

## 2023-05-21 DIAGNOSIS — N1831 Chronic kidney disease, stage 3a: Secondary | ICD-10-CM

## 2023-05-21 DIAGNOSIS — F03B2 Unspecified dementia, moderate, with psychotic disturbance: Secondary | ICD-10-CM

## 2023-05-21 DIAGNOSIS — I959 Hypotension, unspecified: Secondary | ICD-10-CM

## 2023-05-21 DIAGNOSIS — K920 Hematemesis: Secondary | ICD-10-CM

## 2023-05-21 DIAGNOSIS — R443 Hallucinations, unspecified: Secondary | ICD-10-CM

## 2023-05-21 LAB — CULTURE, BLOOD (ROUTINE X 2)

## 2023-05-21 NOTE — Telephone Encounter (Signed)
Orders signed and faxed to number provided by Magda Paganini at [phone]. Placed in fax folder on my cart

## 2023-05-21 NOTE — Progress Notes (Signed)
Provider:  Dr. Earnestine Mealing Location:  Other [place].  Nursing Home Room Number: [facility] 213A Place of Service:  ALF ([phone])  PCP: Sherlene Shams, MD Patient Care Team: Sherlene Shams, MD as PCP - General (Internal Medicine) Marinus Maw, MD as PCP - Electrophysiology (Cardiology) Antonieta Iba, MD as PCP - Cardiology (Cardiology) Lemar Livings, Merrily Pew, MD (General Surgery) Sherlene Shams, MD (Internal Medicine) Sherlene Shams, MD (Internal Medicine) Toney Reil, MD as Consulting Physician (Gastroenterology)  Extended Emergency Contact Information Primary Emergency Contact: Feltz,Brett Address: [address]          [place], [place] [address] D[place] of [place] Phone: [phone] Relation: Son  Code Status: Full Code Goals of Care: Advanced Directive information    05/21/2023   10:01 AM  Advanced Directives  Does Patient Have a Medical Advance Directive? Yes  Type of Estate agent of [place];Living will  Does patient want to make changes to medical advance directive? No - Patient declined  Copy of Healthcare Power of Attorney in Chart? Yes - validated most recent copy scanned in chart (See row information)      Chief Complaint  Patient presents with   Admission    Admission.     HPI: Patient is a 82 y.o. female seen today for admission to [facility]  Patient was recently hospitalized after having coffee ground emesis. She had an EGD which was negative, hoewver, after discussion with family, anticoagulation discontinued. Patient's discharge complicated by urinary retention and a foley catheter remains in place.   Patient is sitting in the living room. She states "if someone could figure out what is wrong with my vagina it would help a lot of people." She denies pain at this time. She says that once the foley catheter is out she hopes to return home.   Nursing with concerns that patient would like to  go home. She has not had falls. She is concerned she may try to pull the  foley and asks about a contingency plan if she does.  Questions for Capacity:  1. Any language or communication barriers: none 2. Condition: What problems are you having now? Why are you in the hospital/facility? Scoring: NO, states she has this tube and that is her only issue. She had the GI bleed and endoscopy, but she should be fine to go home. 3. Alternatives: What other options do you have? Have you considered [alternative treatment  plan]? Scoring: No I can go home by myself. You know I have a friend whose husband has dementia and he gets aid at home.  4. Option of Refusing Proposed Treatment/Care Plan: Can you refuse [treatment/plan]? Scoring: Patient i 5. Consequences of Accepting Proposed Treatment/Care Plan: What could happen to you if you have/do  [proposed treatment/plan]? Can is cause problems/side effects? Can it help you live longer? Scoring: No, She states there is no risk with returning home. She can feed herself and get herself dressed like she has before.  6. Consequences of Refusing Proposed Treatment/Care Plan: What could happen to you if you don't have/do the proposed treatment/plan? Could you get sicker/die? What could happen if you have an alternative treatment/plan? Scoring: No, patient states she would fall. But she did no acknowledge the foley catheter, cooking, cleaning, self care and additional responsibilities that are currently being done by staff. She also does think death is a potential complication. She doesn't know her medications.  7a. The Person's Decision is  Affected by Depression: Can you help me understand why you've decided  to accept/refuse care? Do you feel you're being punished? Do you feel that you're being punished? Do  you think you're a bad person? Do you have any hope for the future? Do you deserve to be treated?  Scoring: NO, She is embarrassed that dementia means she would have to  go to memory care. She is ashamed. He doesn't think she is a bad person. She knows she deserves to be treated.  7b. The Person's Decision is Affected by Psychosis: Do you think anyone is trying to harm you? Do you  trust your doctor/nurse? Scoring : No   Past Medical History:  Diagnosis Date   Acute on chronic diastolic (congestive) heart failure (HCC)    Allergy    See list   Anemia 2018   Anxiety    Occasionally take Xanax for sleep   Anxiety associated with depression    Prn alprazolam    Anxiety associated with depression    Arthritis    Hands, Back   Atrial fibrillation, persistent (HCC)    DCCV 08/22/2015   Bradycardia post-op bradycardia, pacer dependent   MDT PPM 11/06/15, Dr. Ladona Ridgel   Cataract    Left eye   Chronic kidney disease (CKD) stage G3a/A2, moderately decreased glomerular filtration rate (GFR) between 45-59 mL/min/1.73 square meter and albuminuria creatinine ratio between 30-299 mg/g (HCC) 11/13/2021   Closed bilateral fracture of pubic rami (HCC) 08/28/2022   Diverticulosis    Focal seizures (HCC)    Heart murmur    Hematuria, gross 04/09/2018   Hemorrhoid    Hepatic cyst    innumerable   History of asbestos exposure    Hyperlipidemia    IBS (irritable bowel syndrome)    Idiopathic thrombocytopenic purpura (ITP) (HCC)    Migraine    MVP (mitral valve prolapse)    Near syncope 01/26/2023   Nodule of right lung    Osteoporosis of forearm    RBBB    Restrictive lung disease    Mild on PFT & likely cardiac in etiology    Right sided sciatica 04/10/2022   S/P Minimally invasive maze operation for atrial fibrillation 10/31/2015   Complete bilateral atrial lesion set using cryothermy and bipolar radiofrequency ablation with clipping of LA appendage via right mini thoracotomy approach   S/P minimally invasive mitral valve replacement with bioprosthetic valve 10/31/2015   33 mm Mpi [facility] Mitral bovine bioprosthetic tissue valve placed via right mini  thoracotomy approach   Seizures (HCC)    left foot paralysis and left hand paralysis Dr. 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   SVT (supraventricular tachycardia)    Thoracic aorta atherosclerosis (HCC)    TIA (transient ischemic attack)    Visit for preventive health examination 05/23/2016   Past Surgical History:  Procedure Laterality Date   BREAST EXCISIONAL BIOPSY Right Late 80s   Negative X2   CARDIAC CATHETERIZATION N/A 10/18/2015   Procedure: Right/Left Heart Cath and Coronary Angiography;  Surgeon: Tonny Bollman, MD;  Location: [facility] INVASIVE CV LAB;  Service: Cardiovascular;  Laterality: N/A;   CARDIOVERSION N/A 08/22/2015   Procedure: CARDIOVERSION;  Surgeon: Vesta Mixer, MD;  Location: [facility] ENDOSCOPY;  Service: Cardiovascular;  Laterality: N/A;   COLONOSCOPY  2003   EP IMPLANTABLE DEVICE N/A 11/06/2015   Procedure: Pacemaker Implant;  Surgeon: Marinus Maw, MD;  Location: [facility] INVASIVE CV LAB;  Service: Cardiovascular;  Laterality: N/A;   EP IMPLANTABLE DEVICE N/A 02/20/2016   Procedure: Lead Extraction;  Surgeon: Marinus Maw, MD;  Location: [facility] INVASIVE CV LAB;  Service: Cardiovascular;  Laterality: N/A;   ESOPHAGOGASTRODUODENOSCOPY (EGD) WITH PROPOFOL N/A 05/19/2023   Procedure: ESOPHAGOGASTRODUODENOSCOPY (EGD) WITH PROPOFOL;  Surgeon: Midge Minium, MD;  Location: [facility] ENDOSCOPY;  Service: Endoscopy;  Laterality: N/A;   EYE SURGERY Right 2013   FEMUR IM NAIL Left 06/18/2021   Procedure: INTRAMEDULLARY (IM) NAIL FEMORAL, OPEN REDUCTION INTERNAL FIXATION FEMUR RIGHT LITTLE FINGER PIP CLOSED REDUCTION;  Surgeon: Myrene Galas, MD;  Location: [facility] OR;  Service: Orthopedics;  Laterality: Left;   FOOT SURGERY     ~2007 right foot bunion   MANDIBLE FRACTURE SURGERY  03/26/2013   MINIMALLY INVASIVE MAZE PROCEDURE N/A 10/31/2015   Procedure: MINIMALLY INVASIVE MAZE PROCEDURE;  Surgeon: Purcell Nails, MD;  Location: [facility] OR;   Service: Open Heart Surgery;  Laterality: N/A;   MITRAL VALVE REPLACEMENT Right 10/31/2015   Procedure: MINIMALLY INVASIVE MITRAL VALVE (MV) REPLACEMENT;  Surgeon: Purcell Nails, MD;  Location: [facility] OR;  Service: Open Heart Surgery;  Laterality: Right;   PACEMAKER LEAD REMOVAL  02/20/2016   TEE WITH CARDIOVERSION     TEE WITHOUT CARDIOVERSION N/A 08/22/2015   Procedure: TRANSESOPHAGEAL ECHOCARDIOGRAM (TEE);  Surgeon: Vesta Mixer, MD;  Location: [facility] ENDOSCOPY;  Service: Cardiovascular;  Laterality: N/A;   TEE WITHOUT CARDIOVERSION N/A 10/31/2015   Procedure: TRANSESOPHAGEAL ECHOCARDIOGRAM (TEE);  Surgeon: Purcell Nails, MD;  Location: [facility] OR;  Service: Open Heart Surgery;  Laterality: N/A;   TUBAL LIGATION     VARICOSE VEIN SURGERY Right     reports that she has never smoked. She has never used smokeless tobacco. She reports that she does not drink alcohol and does not use drugs. Social History   Socioeconomic History   Marital status: Widowed    Spouse name: Deceased   Number of children: 2   Years of education: 81   Highest education level: Master's degree (e.g., MA, MS, MEng, MEd, MSW, MBA)  Occupational History   Occupation: Retired  Tobacco Use   Smoking status: Never   Smokeless tobacco: Never  Vaping Use   Vaping Use: Never used  Substance and Sexual Activity   Alcohol use: No    Alcohol/week: 3.0 standard drinks of alcohol    Types: 3 Standard drinks or equivalent per week   Drug use: No   Sexual activity: Not Currently  Other Topics Concern   Not on file  Social History Narrative   ** Merged History Encounter **       She is a widow.  Husband died from metastatic renal cell cancer. Originally from [place]. Previously lived in [place] from 1975-[place]. Moved to [place] in 2007. No mold exposure recently but did have it through a prior work exposure in 1993. Has a masters in    public health. No bird exposure.  [facility] (09/29/17): She has moved into a  retirement community since last appointment. She reports she had testing at her new residence that was positive for mold. It has since been treated.    Social Determinants of Health   Financial Resource Strain: Low Risk  (03/17/2023)   Overall Financial Resource Strain (CARDIA)    Difficulty of Paying Living Expenses: Not hard at all  Food Insecurity: No Food Insecurity (05/17/2023)   Hunger Vital Sign    Worried About Running Out of Food in the Last Year: Never  true    Ran Out of Food in the Last Year: Never true  Transportation Needs: No Transportation Needs (05/17/2023)   PRAPARE - Administrator, Civil Service (Medical): No    Lack of Transportation (Non-Medical): No  Physical Activity: Unknown (03/17/2023)   Exercise Vital Sign    Days of Exercise per Week: Patient declined    Minutes of Exercise per Session: Not on file  Stress: No Stress Concern Present (03/17/2023)   [employer] of Occupational Health - Occupational Stress Questionnaire    Feeling of Stress : Only a little  Social Connections: Unknown (03/17/2023)   Social Connection and Isolation Panel [NHANES]    Frequency of Communication with Friends and Family: Patient declined    Frequency of Social Gatherings with Friends and Family: Once a week    Attends Religious Services: Patient declined    Database administrator or Organizations: Yes    Attends Banker Meetings: Patient declined    Marital Status: Widowed  Intimate Partner Violence: Not At Risk (05/17/2023)   Humiliation, Afraid, Rape, and Kick questionnaire    Fear of Current or Ex-Partner: No    Emotionally Abused: No    Physically Abused: No    Sexually Abused: No    Functional Status Survey:    Family History  Problem Relation Age of Onset   Hypertension Mother    Arrhythmia Mother    Heart failure Mother    Arrhythmia Brother    Stroke Brother 36       cerebral hemorrhage, nonsmoker, no HTN   Prostate cancer Brother     Stroke Father        from an aneurysm   Stroke Maternal Aunt 83       cerebral hemorrhage   Liver cancer Maternal Grandmother    Atrial fibrillation Son    Celiac disease Son    Heart attack Neg Hx    Breast cancer Neg Hx     Health Maintenance  Topic Date Due   Medicare Annual Wellness (AWV)  03/13/2019   DTaP/Tdap/Td (2 - Td or Tdap) 06/25/2022   COVID-19 Vaccine (6 - 2023-24 season) 08/02/2022   INFLUENZA VACCINE  07/03/2023   Pneumonia Vaccine 39+ Years old  Completed   DEXA SCAN  Completed   Zoster Vaccines- Shingrix  Completed   HPV VACCINES  Aged Out   Hepatitis C Screening  Discontinued    Allergies  Allergen Reactions   Codeine Nausea And Vomiting and Other (See Comments)    migraine   Epinephrine Palpitations and Shortness Of Breath   Hydrocodone Nausea And Vomiting and Other (See Comments)    MIGRAINE   Hydromorphone Nausea And Vomiting and Other (See Comments)    migraine   Molds & Smuts Anxiety, Other (See Comments) and Shortness Of Breath   Oxycodone Nausea And Vomiting and Other (See Comments)    Severe migraine   Meloxicam Other (See Comments)    Severe reflux   Codeine Other (See Comments)    Migraine   Dextromethorphan Other (See Comments)    seizures   Diphen [Diphenhydramine Hcl] Other (See Comments)    seizure   Diphenhydramine Other (See Comments)    seizure   Diphenhydramine Hcl     Other reaction(s): Other (See Comments)   Diphenylpyraline Other (See Comments)   Doxycycline Itching, Swelling and Other (See Comments)    Facial   Doxycycline Itching and Swelling   Hydrocodone Other (See Comments)  Migraine   Hydromorphone Other (See Comments)    Migraine   Meloxicam Other (See Comments)   Mobic [Meloxicam] Other (See Comments)    reflux   Nsaids    Nsaids Other (See Comments)    Pt on blood thinner   Oxycodone Other (See Comments)    Migraine   Propofol Other (See Comments)   Propofol Other (See Comments)    Very sensitive;  patient stated she was told she was told she had apnea    Outpatient Encounter Medications as of 05/21/2023  Medication Sig   acetaminophen (TYLENOL) 325 MG tablet Take 650 mg by mouth every 4 (four) hours as needed for mild pain or moderate pain.   ARIPiprazole (ABILIFY) 10 MG tablet Take 10 mg by mouth every evening.   brivaracetam (BRIVIACT) 25 MG TABS tablet Take 25 mg by mouth daily.   furosemide (LASIX) 40 MG tablet Take 40 mg by mouth daily as needed for fluid or edema.   gabapentin (NEURONTIN) 100 MG capsule Take 200 mg by mouth 3 (three) times daily.   lamoTRIgine (LAMICTAL) 150 MG tablet Take 150 mg by mouth daily.   lamoTRIgine (LAMICTAL) 200 MG tablet Take 200 mg by mouth at bedtime.   metaxalone (SKELAXIN) 800 MG tablet Take 800 mg by mouth 3 (three) times daily as needed for muscle spasms.   metoprolol tartrate (LOPRESSOR) 25 MG tablet Take 25 mg by mouth daily.   midodrine (PROAMATINE) 2.5 MG tablet Take 2.5 mg by mouth 3 (three) times daily with meals.   montelukast (SINGULAIR) 10 MG tablet TAKE ONE TABLET BY MOUTH AT BEDTIME   ondansetron (ZOFRAN) 8 MG tablet Take 8 mg by mouth every 8 (eight) hours as needed for nausea or vomiting.   pantoprazole (PROTONIX) 40 MG tablet Take 1 tablet (40 mg total) by mouth 2 (two) times daily before a meal.   polyethylene glycol (MIRALAX / GLYCOLAX) 17 g packet Take 17 g by mouth daily as needed for mild constipation or moderate constipation.   rosuvastatin (CRESTOR) 40 MG tablet Take 1 tablet (40 mg total) by mouth daily.   VENTOLIN HFA 108 (90 Base) MCG/ACT inhaler INHALE 2 PUFFS INTO THE LUNGS EVERY 6 HOURS AS NEEDED FOR WHEEZING OR SHORTNESS OF BREATH   No facility-administered encounter medications on file as of 05/21/2023.    Review of Systems  Vitals:   05/21/23 0956  BP: 114/71  Pulse: 75  Resp: 14  Temp: 98.6 F (37 C)  SpO2: 93%  Weight: 137 lb (62.1 kg)  Height: 5\' 5"  (1.651 m)   Body mass index is 22.8  kg/m. Physical Exam Constitutional:      Comments: Disheveled in wheelchair, moved from living room to patient's room   Cardiovascular:     Rate and Rhythm: Normal rate and regular rhythm.     Pulses: Normal pulses.  Pulmonary:     Effort: Pulmonary effort is normal.  Abdominal:     General: Abdomen is flat.     Palpations: Abdomen is soft.  Genitourinary:    Comments: Foley catheter in place    Labs reviewed: Basic Metabolic Panel: Recent Labs    04/29/23 1500 05/17/23 1201 05/18/23 0418 05/19/23 0410 05/20/23 0330  NA 141   < > 137 137 136  K 3.2*   < > 3.6 3.5 3.6  CL 102   < > 103 103 104  CO2 28   < > 25 25 26   GLUCOSE 99   < > 81  95 84  BUN 29*   < > 23 19 18   CREATININE 1.51*   < > 0.94 0.96 0.87  CALCIUM 9.4   < > 8.7* 8.7* 8.3*  MG 2.1  --   --  2.2  --    < > = values in this interval not displayed.   Liver Function Tests: Recent Labs    04/23/23 0127 04/29/23 1501 05/17/23 1201  AST 50* 33 47*  ALT 27 24 26   ALKPHOS 111 109 123  BILITOT 1.0 0.9 1.5*  PROT 6.5 6.1* 6.8  ALBUMIN 4.2 3.7 4.3   Recent Labs    04/29/23 1501  LIPASE 84*   Recent Labs    04/29/23 2118  AMMONIA 22   CBC: Recent Labs    04/03/23 1335 04/17/23 0529 04/19/23 1331 04/23/23 0127 04/29/23 1500 05/18/23 0418 05/19/23 0410 05/20/23 0330  WBC 3.8* 3.4*   < > 4.3   < > 4.7 4.9 4.1  NEUTROABS 2.6 2.2  --  3.1  --   --   --   --   HGB 8.9* 9.0*   < > 9.8*   < > 8.1* 8.3* 7.7*  HCT 28.0* 28.6*   < > 31.2*   < > 25.7* 27.0* 24.7*  MCV 93.0 95.0   < > 95.1   < > 94.8 95.4 95.4  PLT 111* 91*   < > 107*   < > 116* 111* 109*   < > = values in this interval not displayed.   Cardiac Enzymes: Recent Labs    01/26/23 1019  CKTOTAL 115   BNP: Invalid input(s): "POCBNP" Lab Results  Component Value Date   HGBA1C 5.5 02/11/2020   Lab Results  Component Value Date   TSH 4.842 (H) 04/29/2023   Lab Results  Component Value Date   VITAMINB12 3,392 (H) 04/23/2023    Lab Results  Component Value Date   FOLATE 25.0 05/18/2023   Lab Results  Component Value Date   IRON 47 04/03/2023   TIBC 462 (H) 04/03/2023   FERRITIN 18 04/03/2023    Imaging and Procedures obtained prior to SNF admission: CT HEAD WO CONTRAST ( )  Result Date: 05/18/2023 CLINICAL DATA:  Delirium. EXAM: CT HEAD WITHOUT CONTRAST TECHNIQUE: Contiguous axial images were obtained from the base of the skull through the vertex without intravenous contrast. RADIATION DOSE REDUCTION: This exam was performed according to the departmental dose-optimization program which includes automated exposure control, adjustment of the mA and/or kV according to patient size and/or use of iterative reconstruction technique. COMPARISON:  CT head without contrast 04/29/2023 FINDINGS: Brain: Mild atrophy and white matter changes are similar the prior study. No acute infarct, hemorrhage, or mass lesion is present. Deep brain nuclei are within normal limits. The ventricles are of normal size. No significant extraaxial fluid collection is present. The brainstem and cerebellum are within normal limits. Midline structures are within normal limits. Vascular: Atherosclerotic calcifications are present within the cavernous internal carotid arteries bilaterally. No hyperdense vessel is present. Skull: A left parietal scalp hematoma is present. Underlying fracture or foreign body is present. Sinuses/Orbits: The paranasal sinuses and mastoid air cells are clear. Right lens replacement is present. Globes and orbits are otherwise within normal limits. IMPRESSION: 1. Left parietal scalp hematoma without underlying fracture or foreign body. 2. Stable mild atrophy and white matter disease. This likely reflects the sequela of chronic microvascular ischemia. 3. No acute intracranial abnormality or significant interval change. Electronically Signed   By:  Marin Roberts M.D.   On: 05/18/2023 17:45   CT ABDOMEN PELVIS W  CONTRAST  Result Date: 05/17/2023 CLINICAL DATA:  82 year old female with abdominal pain, falls, bruising to the abdomen and back. EXAM: CT ABDOMEN AND PELVIS WITH CONTRAST TECHNIQUE: Multidetector CT imaging of the abdomen and pelvis was performed using the standard protocol following bolus administration of intravenous contrast. RADIATION DOSE REDUCTION: This exam was performed according to the departmental dose-optimization program which includes automated exposure control, adjustment of the mA and/or kV according to patient size and/or use of iterative reconstruction technique. CONTRAST:  OMNIPAQUE IOHEXOL 300 MG/ML  SOLN COMPARISON:  CT Abdomen and Pelvis 04/23/2023. FINDINGS: Lower chest: Partially visible cardiomegaly appears stable. No pericardial or pleural effusion. Partially visible cardiac pacer lead. Hepatobiliary: Numerous chronic hepatic cysts have not significantly changed since 2017 and are benign (no follow-up imaging recommended). Diminutive, negative gallbladder. Pancreas: Within normal limits. Spleen: Negative. Adrenals/Urinary Tract: Normal adrenal glands. Nonobstructed kidneys. Occasional chronic renal cysts also present in 2017 (no follow-up imaging recommended). Symmetric renal enhancement and contrast excretion with no hydronephrosis or hydroureter. But the urinary bladder is markedly distended today (sagittal image 63). Estimated bladder volume 920 mL. Stomach/Bowel: No dilated large or small bowel. Retained stool throughout the colon. Normal appendix on series 2, image 43. Decompressed stomach. No free air or free fluid. Vascular/Lymphatic: Extensive Aortoiliac calcified atherosclerosis. Major arterial structures remain patent. No lymphadenopathy. Early portal venous timing on the initial phase, on the delayed images the main portal venous structures appear to be patent. Reproductive: Negative. Other: No pelvic free fluid. Musculoskeletal: Moderate to severe dextroconvex lumbar  scoliosis and widespread lumbar, lower thoracic spine degeneration. Chronic lower lumbar spondylolisthesis. Chronic right posterior rib fractures appear stable. But there is a non healed transverse fracture of the sacrum through the S3 level (sagittal images 56 through 61) which is now minimally displaced, but likely subacute. Superimposed bilateral sacral ala insufficiency fractures with sclerosis, stable. SI joints remain intact. Chronic fractures of the right pubic rami, bordering the anterior right acetabulum again noted. Previous left femur ORIF. IMPRESSION: 1. A transverse fracture through the Sacrum at S3 is mildly displaced, but appears subacute, and is superimposed on multiple chronic sacral and pelvic fractures. Query associated S3 to S5 sacral radiculopathy. 2. Marked urinary bladder distension (920 mL). Query urinary retention. 3. No other acute or inflammatory process identified in the abdomen or pelvis. Aortic Atherosclerosis (ICD10-I70.0). Electronically Signed   By: Odessa Fleming M.D.   On: 05/17/2023 13:04    Assessment/Plan Moderate dementia with psychotic disturbance, unspecified dementia type (HCC)  Chronic kidney disease (CKD) stage G3a/A2, moderately decreased glomerular filtration rate (GFR) between 45-59 mL/min/1.73 square meter and albuminuria creatinine ratio between 30-299 mg/g (HCC)  Hypotension, unspecified hypotension type  Multiple falls  Hallucinations In this case I feel the most likely underlying pathological process contributing to the cognitive problems in Dashanique Brownstein is non-reversible  Cognitive deficits are in memory, judgement. (Slums 18/30 on 6/3) Affective and behavioral components are abnormal. There is a gait component, however, seems to be unrelated to cognitive deficit and more so due to orthostatic hypotension. There is not a sleep component to the cognitive. Patient has had visual and auditory hallucinations, however, is not having them at this time.    Decision making capacity: After obtaining the history from the patient and informant, reviewing the  cognitive testing and discussing the patient's understanding of their impairment and limitations, it is my  opinion that [name] does  not have decision making capacity and lacks competence to make healthcare decisions based on the answers above. She cannot reason the risks, benefits, consequences of care plan changes.    PLAN  Problem: Cognitive concerns  Plan: Patient to remain in memory care Problem: Gait concerns Plan:  Problem: Affective and behavioral components Plan: Continue physical therapy Problem: Sleep concerns Plan: none at this time Problem: Functional limitations Plan: Patient to remain in memory care for assistance with self care. Psychologist to see patient for transition Problem: Advanced Care Planning:  Patient has an active HCPOA at this time.   Family/ staff Communication: nursing  Labs/tests ordered: CBC, D/c foley in 1 week. Could consider repeat slums in 3-6

## 2023-05-21 NOTE — Transitions of Care (Post Inpatient/ED Visit) (Signed)
05/21/2023  Name: [name] [name] MRN: [id number] DOB: Jul 20, 1941  Today's TOC FU Call Status: Today's TOC FU Call Status:: Successful TOC FU Call Competed TOC FU Call Complete Date: 05/21/23  Transition Care Management Follow-up Telephone Call Date of Discharge: 05/20/23 Discharge Facility: [facility]) Type of Discharge: Inpatient Admission Primary Inpatient Discharge Diagnosis:: hematemesis How have you been since you were released from the hospital?: Better Any questions or concerns?: No  Items Reviewed: Did you receive and understand the discharge instructions provided?: Yes Medications obtained,verified, and reconciled?: Yes (Medications Reviewed) Any new allergies since your discharge?: No Dietary orders reviewed?: Yes Do you have support at home?: Yes People in Home: facility resident Name of Support/Comfort Primary Source: [place]  Medications Reviewed Today: Medications Reviewed Today     Reviewed by Karena Addison, LPN (Licensed Practical Nurse) on 05/21/23 at 0935  Med List Status: <None>   Medication Order Taking? Sig Documenting Provider Last Dose Status Informant  acetaminophen (TYLENOL) 325 MG tablet [id number] No Take 650 mg by mouth every 4 (four) hours as needed for mild pain or moderate pain. [provider] Unknown PRN Active Care Giver  ARIPiprazole (ABILIFY) 10 MG tablet [id number] No Take 10 mg by mouth every evening. [provider] 05/16/2023 2100 Active Care Giver  brivaracetam (BRIVIACT) 25 MG TABS tablet [id number] No Take 25 mg by mouth daily. [provider] 05/17/2023 0800 Active Care Giver           Med Note Truman Hayward   GEX May 17, 2023  1:56 PM) Medication to discontinue 05/22/2023  furosemide (LASIX) 40 MG tablet [id number] No Take 40 mg by mouth daily as needed for fluid or edema. [provider] Unknown PRN Active Care Giver  gabapentin (NEURONTIN) 100 MG capsule [id number]  No Take 200 mg by mouth 3 (three) times daily. [provider] 05/17/2023 0800 Active Care Giver  lamoTRIgine (LAMICTAL) 150 MG tablet [id number] No Take 150 mg by mouth daily. [provider] 05/17/2023 0800 Active Care Giver  lamoTRIgine (LAMICTAL) 200 MG tablet [id number] No Take 200 mg by mouth at bedtime. [provider] 05/16/2023 2100 Active Care Giver  metaxalone [facility]) 800 MG tablet [id number] No Take 800 mg by mouth 3 (three) times daily as needed for muscle spasms. [provider] Unknown PRN Active Care Giver  metoprolol tartrate (LOPRESSOR) 25 MG tablet [id number] No Take 25 mg by mouth daily. [provider] 05/17/2023 0800 Active Care Giver  midodrine (PROAMATINE) 2.5 MG tablet [id number] No Take 2.5 mg by mouth 3 (three) times daily with meals. [provider] 05/17/2023 0800 Active Care Giver  montelukast (SINGULAIR) 10 MG tablet [id number] No TAKE ONE TABLET BY MOUTH AT BEDTIME Sherlene Shams, MD 05/16/2023 2100 Active Care Giver  ondansetron (ZOFRAN) 8 MG tablet [id number] No Take 8 mg by mouth every 8 (eight) hours as needed for nausea or vomiting. [provider] Unknown PRN Active Care Giver  pantoprazole (PROTONIX) 40 MG tablet [id number]  Take 1 tablet (40 mg total) by mouth 2 (two) times daily before a meal. Delfino Lovett, MD  Active   polyethylene glycol (MIRALAX / GLYCOLAX) 17 g packet [id number] No Take 17 g by mouth daily as needed for mild constipation or moderate constipation. [provider] Unknown PRN Active Care Giver  rosuvastatin (CRESTOR) 40 MG tablet [id number] No Take 1 tablet (40 mg total) by mouth daily.  Patient taking differently: Take 40 mg by mouth at bedtime.  Sherlene Shams, MD 05/16/2023 2100 Active Care Giver  VENTOLIN HFA 108 (90 Base) MCG/ACT inhaler [id number] No INHALE 2 PUFFS INTO THE LUNGS EVERY 6 HOURS AS NEEDED FOR WHEEZING OR SHORTNESS OF BREATH Sherlene Shams, MD Unknown PRN Active  Care Giver  Med List Note Truman Hayward, CPhT 05/17/23 1824): Care of: [facility] (as of 05/17/2023)            Home Care and Equipment/Supplies: Were Home Health Services Ordered?: NA Any new equipment or medical supplies ordered?: NA  Functional Questionnaire: Do you need assistance with bathing/showering or dressing?: Yes Do you need assistance with meal preparation?: Yes Do you need assistance with eating?: No Do you have difficulty maintaining continence: Yes Do you need assistance with getting out of bed/getting out of a chair/moving?: No Do you have difficulty managing or taking your medications?: Yes  Follow up appointments reviewed: PCP Follow-up appointment confirmed?: Yes Date of PCP follow-up appointment?: 06/02/23 Follow-up Provider: [facility] Follow-up appointment confirmed?: NA Do you need transportation to your follow-up appointment?: No Do you understand care options if your condition(s) worsen?: Yes-patient verbalized understanding    SIGNATURE Karena Addison, LPN [facility] Nurse Health Advisor Direct Dial [phone]

## 2023-05-21 NOTE — Telephone Encounter (Signed)
[name] [name] called in from [facility] memory care. She wanted to inform that pt is under their care now and will not be going back home. She also is asking about pt's LE arterial on 06/26/23. She wants to know if pt still needs this appt or is this different than the test she had 04/09/23. Please advise.

## 2023-05-21 NOTE — Telephone Encounter (Signed)
Called and spoke with Magda Paganini at [facility]. She wanted to inquire if test scheduled for 06/26/23 is still needed. Advised that it was ordered and that she should keep appointment. She reports that patient is now in [facility]. Magda Paganini then requested that we send orders over to them so they can have them done there. Advised that I would review with provider and will get orders to fax to them.

## 2023-05-22 LAB — CULTURE, BLOOD (ROUTINE X 2)

## 2023-05-22 NOTE — Telephone Encounter (Signed)
Spoke with [facility] nurse and she stated that the in house doctor has ordered for a CBC and CMP to be done on Monday because they do not have lab on Friday. They will fax [place] the results when they receive them.

## 2023-05-22 NOTE — Telephone Encounter (Signed)
[name] [name] calling back from [facility]. She works with [employer]. She is calling to clarify the order and the diagnosis for it. Please advise.

## 2023-05-22 NOTE — Telephone Encounter (Signed)
Spoke with [name] [name] in regards to her call. She just needed diagnosis for that testing requested. Provided her with that information and she verbalized understanding with no further questions at this time.

## 2023-05-26 LAB — CBC AND DIFFERENTIAL
Hemoglobin: 9.4 — AB (ref 12.0–16.0)
Platelets: 201 10*3/uL (ref 150–400)

## 2023-05-26 LAB — BASIC METABOLIC PANEL
BUN: 22 — AB (ref 4–21)
Creatinine: 1.1 (ref 0.5–1.1)
Potassium: 3.4 mEq/L — AB (ref 3.5–5.1)
Sodium: 137 (ref 137–147)

## 2023-05-26 LAB — HEPATIC FUNCTION PANEL
ALT: 15 U/L (ref 7–35)
AST: 26 (ref 13–35)
Alkaline Phosphatase: 129 — AB (ref 25–125)
Bilirubin, Total: 0.8

## 2023-05-29 ENCOUNTER — Ambulatory Visit: Payer: Medicare Other | Admitting: Cardiology

## 2023-05-30 ENCOUNTER — Encounter: Payer: Self-pay | Admitting: Student

## 2023-05-30 ENCOUNTER — Telehealth: Payer: Self-pay

## 2023-05-30 NOTE — Telephone Encounter (Signed)
Pt is now in memory care at [facility] facility. I ordered her a new monitor.

## 2023-06-02 ENCOUNTER — Encounter: Payer: Self-pay | Admitting: Internal Medicine

## 2023-06-02 ENCOUNTER — Ambulatory Visit (INDEPENDENT_AMBULATORY_CARE_PROVIDER_SITE_OTHER): Payer: Medicare Other | Admitting: Internal Medicine

## 2023-06-02 VITALS — BP 90/50 | HR 94 | Ht 65.0 in | Wt 143.0 lb

## 2023-06-02 DIAGNOSIS — R944 Abnormal results of kidney function studies: Secondary | ICD-10-CM

## 2023-06-02 DIAGNOSIS — D631 Anemia in chronic kidney disease: Secondary | ICD-10-CM

## 2023-06-02 DIAGNOSIS — N1831 Chronic kidney disease, stage 3a: Secondary | ICD-10-CM

## 2023-06-02 DIAGNOSIS — W19XXXS Unspecified fall, sequela: Secondary | ICD-10-CM

## 2023-06-02 DIAGNOSIS — Z9181 History of falling: Secondary | ICD-10-CM | POA: Diagnosis not present

## 2023-06-02 DIAGNOSIS — D638 Anemia in other chronic diseases classified elsewhere: Secondary | ICD-10-CM

## 2023-06-02 DIAGNOSIS — I4819 Other persistent atrial fibrillation: Secondary | ICD-10-CM

## 2023-06-02 DIAGNOSIS — K920 Hematemesis: Secondary | ICD-10-CM | POA: Diagnosis not present

## 2023-06-02 DIAGNOSIS — D6869 Other thrombophilia: Secondary | ICD-10-CM

## 2023-06-02 DIAGNOSIS — R569 Unspecified convulsions: Secondary | ICD-10-CM

## 2023-06-02 DIAGNOSIS — F0390 Unspecified dementia without behavioral disturbance: Secondary | ICD-10-CM

## 2023-06-02 DIAGNOSIS — G40909 Epilepsy, unspecified, not intractable, without status epilepticus: Secondary | ICD-10-CM

## 2023-06-02 DIAGNOSIS — D696 Thrombocytopenia, unspecified: Secondary | ICD-10-CM

## 2023-06-02 LAB — BASIC METABOLIC PANEL
BUN: 29 mg/dL — ABNORMAL HIGH (ref 6–23)
CO2: 28 mEq/L (ref 19–32)
Calcium: 10.1 mg/dL (ref 8.4–10.5)
Chloride: 102 mEq/L (ref 96–112)
Creatinine, Ser: 1.26 mg/dL — ABNORMAL HIGH (ref 0.40–1.20)
GFR: 39.73 mL/min — ABNORMAL LOW (ref >60.00)
Glucose, Bld: 86 mg/dL (ref 70–99)
Potassium: 3.9 mEq/L (ref 3.5–5.1)
Sodium: 141 mEq/L (ref 135–145)

## 2023-06-02 LAB — IBC + FERRITIN
Ferritin: 220.2 ng/mL (ref 10.0–291.0)
Iron: 62 ug/dL (ref 42–145)
Saturation Ratios: 20.8 % (ref 20.0–50.0)
TIBC: 298.2 ug/dL (ref 250.0–450.0)
Transferrin: 213 mg/dL (ref 212.0–360.0)

## 2023-06-02 LAB — CBC WITH DIFFERENTIAL/PLATELET
Basophils Absolute: 0 10*3/uL (ref 0.0–0.1)
Basophils Relative: 1 % (ref 0.0–3.0)
Eosinophils Absolute: 0.3 10*3/uL (ref 0.0–0.7)
Eosinophils Relative: 6.3 % — ABNORMAL HIGH (ref 0.0–5.0)
HCT: 27.3 % — ABNORMAL LOW (ref 36.0–46.0)
Hemoglobin: 8.9 g/dL — ABNORMAL LOW (ref 12.0–15.0)
Lymphocytes Relative: 18.1 % (ref 12.0–46.0)
Lymphs Abs: 0.8 10*3/uL (ref 0.7–4.0)
MCHC: 32.5 g/dL (ref 30.0–36.0)
MCV: 92.3 fl (ref 78.0–100.0)
Monocytes Absolute: 0.3 10*3/uL (ref 0.1–1.0)
Monocytes Relative: 6.8 % (ref 3.0–12.0)
Neutro Abs: 3.1 10*3/uL (ref 1.4–7.7)
Neutrophils Relative %: 67.8 % (ref 43.0–77.0)
Platelets: 143 10*3/uL — ABNORMAL LOW (ref 150.0–400.0)
RBC: 2.96 Mil/uL — ABNORMAL LOW (ref 3.87–5.11)
RDW: 18.4 % — ABNORMAL HIGH (ref 11.5–15.5)
WBC: 4.6 10*3/uL (ref 4.0–10.5)

## 2023-06-02 NOTE — Patient Instructions (Addendum)
Encourage a gradual increase in water intake  60 ounces of water daily to keep kidneys filtering well   I will make contact with Dr Sherryll Burger (and Dr Maryruth Bun )

## 2023-06-02 NOTE — Assessment & Plan Note (Signed)
Secondary to right temporal lobe focus, Now managed with  lamictal since  Breviact was weaned off.   Episodes of parasthesias are often triggered by medication non adherence and use of triggering medications .

## 2023-06-02 NOTE — Assessment & Plan Note (Signed)
No change despite dc of  Briviact  g, as her psychosis has persisted despite stopping the Breviact/.  Zyprexa has been employed to reduce the nocturnal disturbances and her electronic media have been restricted .  She has been referred to psychiatry but has not been scheduled .  Changing referral to Dr. Maryruth Bun

## 2023-06-02 NOTE — Assessment & Plan Note (Signed)
She remains walker dependent

## 2023-06-02 NOTE — Assessment & Plan Note (Addendum)
She had an admission for hematemesis; hgb dropped to 7.7 EGD  done June 7  and was normal ecept for erythema. Her anticoagulant was stopped and she has not had any subsequent episodes. Repeat hgb on June 24 was done by [facility] and 9.4.  and has drifted down slightly despite no repeat episodes.  Iron stores are normal.  She has CKD   Lab Results  Component Value Date   IRON 62 06/02/2023   TIBC 298.2 06/02/2023   FERRITIN 220.2 06/02/2023    Lab Results  Component Value Date   WBC 4.6 06/02/2023   HGB 8.9 Repeated and verified X2. (L) 06/02/2023   HCT 27.3 (L) 06/02/2023   MCV 92.3 06/02/2023   PLT 143.0 (L) 06/02/2023

## 2023-06-02 NOTE — Progress Notes (Signed)
Subjective:  Patient ID: [name] [name], female    DOB: [birth year]  Age: 82 y.o. MRN: [id number]  CC: The primary encounter diagnosis was Anemia in stage 3a chronic kidney disease (HCC). 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   HPI [name] [name] presents for  Chief Complaint  Patient presents with   Medical Management of Chronic Issues    Follow up on medication changes    [name] [name] is [age] yr old female with a history of seizure disorder,  SSS s/p pacemaker,  and hematemesis who over the past 2 months has developed significant mental status changes including psychotic behavior   she is accompanied by her son, [name] [name] to follow up on new onset psychosis lus/minus dissociative seizures that was initially  attributed to but have not resolved despite dc medication 3 weeks ago.     Per son she has made some improvement in weakness,  Unfortunately she has continue to have episodes of psychosis and bizaarre behavior.   She has not seen her neurologist since early May.  She has not seen a psychiatrist except during an ER visit for same (where she was deemed stable for discharge) .    She remains In need of  24/7  supervision and is residing at [facility] care at [facility].  Her most recent evaluation was by Dr Sydnee Cabal on June 19th and she was found to be incompetent to make her own decisions or  live independently  due to poor judgement, lack of insight,  and inability to manage her medications.   Previous attempts to return patient to her home with 24/7 care resulted in patient "firing " the aides in 3 to 5 days.  She continues to have visual and auditory hallucinations  resulting in disturbances (she texted 12 people from 3 am to 9 am with calls for help ( "call 911") She was prescribed Zyprexa  (2.5 mg daily prn,  5 mg at bedtime )   to manage her  psychosis.  She is walking better with the walker,  but has had2 mino falls I nthe last 72 hoursL  an  Unwitnessed fall on June 30  , and one on June 29 while a friend was visiting.  She is navigating better with her walker ; the falls were attributed to the shoes she was wearing  , which she has tossed ,out  and not paying attention .    She feels "stressed" about where she is currently (Memory Unit)  ; has only one fellow resident  she can talk to because the rest are not articulate or conversant.   Seizure disorder:  she was taking Breviact for many months  but recent ER evaluation by on call neurologist suggested that she was having new onset dissociative seizures.  .  She has not seen her regular neurologist yet. .  She states that she does not recall many of the events where she Is noted to act bizarrely Psychosis : does not have a psychiatrist , 2 referrals have been made .   Family is requesting additional imaging of brain to evaluate placque that may be helpful in diagnosing dementia ; however she has a pacemaker   Outpatient Medications Prior to Visit  Medication Sig Dispense Refill   acetaminophen (TYLENOL) 325 MG tablet Take 650 mg by mouth every 4 (four) hours as  needed for mild pain or moderate pain.     ARIPiprazole (ABILIFY) 10 MG tablet Take 10 mg by mouth every evening.     furosemide (LASIX) 40 MG tablet Take 40 mg by mouth daily as needed for fluid or edema.     gabapentin (NEURONTIN) 100 MG capsule Take 200 mg by mouth 3 (three) times daily.     lamoTRIgine (LAMICTAL) 150 MG tablet Take 150 mg by mouth daily.     lamoTRIgine (LAMICTAL) 200 MG tablet Take 200 mg by mouth at bedtime.     metaxalone (SKELAXIN) 800 MG tablet Take 800 mg by mouth 3 (three) times daily as needed for muscle spasms.     metoprolol tartrate (LOPRESSOR) 25 MG tablet Take 25 mg by mouth daily.     midodrine (PROAMATINE) 2.5 MG tablet Take 2.5 mg by mouth 3 (three) times daily with meals.      montelukast (SINGULAIR) 10 MG tablet TAKE ONE TABLET BY MOUTH AT BEDTIME 90 tablet 1   OLANZapine (ZYPREXA) 2.5 MG tablet Take 2.5 mg by mouth at bedtime.     OLANZapine (ZYPREXA) 5 MG tablet Take 5 mg by mouth at bedtime.     ondansetron (ZOFRAN) 8 MG tablet Take 8 mg by mouth every 8 (eight) hours as needed for nausea or vomiting.     pantoprazole (PROTONIX) 40 MG tablet Take 1 tablet (40 mg total) by mouth 2 (two) times daily before a meal. 60 tablet 0   polyethylene glycol (MIRALAX / GLYCOLAX) 17 g packet Take 17 g by mouth daily as needed for mild constipation or moderate constipation.     rosuvastatin (CRESTOR) 40 MG tablet Take 1 tablet (40 mg total) by mouth daily. 90 tablet 3   VENTOLIN HFA 108 (90 Base) MCG/ACT inhaler INHALE 2 PUFFS INTO THE LUNGS EVERY 6 HOURS AS NEEDED FOR WHEEZING OR SHORTNESS OF BREATH 18 g 1   No facility-administered medications prior to visit.    Review of Systems;  Patient denies headache, fevers, malaise, unintentional weight loss, skin rash, eye pain, sinus congestion and sinus pain, sore throat, dysphagia,  hemoptysis , cough, dyspnea, wheezing, chest pain, palpitations, orthopnea, edema, abdominal pain, nausea, melena, diarrhea, constipation, flank pain, dysuria, hematuria, urinary  Frequency, nocturia, numbness, tingling, seizures,  Focal weakness, Loss of consciousness,  Tremor, insomnia, depression, anxiety, and suicidal ideation.      Objective:  BP (!) 90/50   Pulse 94   Ht 5\' 5"  (1.651 m)   Wt 143 lb (64.9 kg)   SpO2 96%   BMI 23.80 kg/m   BP Readings from Last 3 Encounters:  06/02/23 (!) 90/50  05/21/23 114/71  05/20/23 120/72    Wt Readings from Last 3 Encounters:  06/02/23 143 lb (64.9 kg)  05/21/23 137 lb (62.1 kg)  05/19/23 136 lb 11 oz (62 kg)    Physical Exam  Lab Results  Component Value Date   HGBA1C 5.5 02/11/2020   HGBA1C 5.7 05/21/2019   HGBA1C 5.3 04/07/2018    Lab Results  Component Value Date   CREATININE  1.1 05/26/2023   CREATININE 0.87 05/20/2023   CREATININE 0.96 05/19/2023    Lab Results  Component Value Date   WBC 4.1 05/20/2023   HGB 9.4 (A) 05/26/2023   HCT 24.7 (L) 05/20/2023   PLT 201 05/26/2023   GLUCOSE 84 05/20/2023   CHOL 143 05/21/2019   TRIG 60.0 05/21/2019   HDL 59.50 05/21/2019   LDLDIRECT 113.0 02/15/2016   LDLCALC 71  05/21/2019   ALT 15 05/26/2023   AST 26 05/26/2023   NA 137 05/26/2023   K 3.4 (A) 05/26/2023   CL 104 05/20/2023   CREATININE 1.1 05/26/2023   BUN 22 (A) 05/26/2023   CO2 26 05/20/2023   TSH 4.842 (H) 04/29/2023   INR 1.2 06/18/2021   HGBA1C 5.5 02/11/2020   MICROALBUR 2.8 (H) 11/13/2021    CT HEAD WO CONTRAST ( )  Result Date: 05/18/2023 CLINICAL DATA:  Delirium. EXAM: CT HEAD WITHOUT CONTRAST TECHNIQUE: Contiguous axial images were obtained from the base of the skull through the vertex without intravenous contrast. RADIATION DOSE REDUCTION: This exam was performed according to the departmental dose-optimization program which includes automated exposure control, adjustment of the mA and/or kV according to patient size and/or use of iterative reconstruction technique. COMPARISON:  CT head without contrast 04/29/2023 FINDINGS: Brain: Mild atrophy and white matter changes are similar the prior study. No acute infarct, hemorrhage, or mass lesion is present. Deep brain nuclei are within normal limits. The ventricles are of normal size. No significant extraaxial fluid collection is present. The brainstem and cerebellum are within normal limits. Midline structures are within normal limits. Vascular: Atherosclerotic calcifications are present within the cavernous internal carotid arteries bilaterally. No hyperdense vessel is present. Skull: A left parietal scalp hematoma is present. Underlying fracture or foreign body is present. Sinuses/Orbits: The paranasal sinuses and mastoid air cells are clear. Right lens replacement is present. Globes and orbits are  otherwise within normal limits. IMPRESSION: 1. Left parietal scalp hematoma without underlying fracture or foreign body. 2. Stable mild atrophy and white matter disease. This likely reflects the sequela of chronic microvascular ischemia. 3. No acute intracranial abnormality or significant interval change. Electronically Signed   By: Marin Roberts M.D.   On: 05/18/2023 17:45   CT ABDOMEN PELVIS W CONTRAST  Result Date: 05/17/2023 CLINICAL DATA:  82 year old female with abdominal pain, falls, bruising to the abdomen and back. EXAM: CT ABDOMEN AND PELVIS WITH CONTRAST TECHNIQUE: Multidetector CT imaging of the abdomen and pelvis was performed using the standard protocol following bolus administration of intravenous contrast. RADIATION DOSE REDUCTION: This exam was performed according to the departmental dose-optimization program which includes automated exposure control, adjustment of the mA and/or kV according to patient size and/or use of iterative reconstruction technique. CONTRAST:  OMNIPAQUE IOHEXOL 300 MG/ML  SOLN COMPARISON:  CT Abdomen and Pelvis 04/23/2023. FINDINGS: Lower chest: Partially visible cardiomegaly appears stable. No pericardial or pleural effusion. Partially visible cardiac pacer lead. Hepatobiliary: Numerous chronic hepatic cysts have not significantly changed since 2017 and are benign (no follow-up imaging recommended). Diminutive, negative gallbladder. Pancreas: Within normal limits. Spleen: Negative. Adrenals/Urinary Tract: Normal adrenal glands. Nonobstructed kidneys. Occasional chronic renal cysts also present in 2017 (no follow-up imaging recommended). Symmetric renal enhancement and contrast excretion with no hydronephrosis or hydroureter. But the urinary bladder is markedly distended today (sagittal image 63). Estimated bladder volume 920 mL. Stomach/Bowel: No dilated large or small bowel. Retained stool throughout the colon. Normal appendix on series 2, image 43.  Decompressed stomach. No free air or free fluid. Vascular/Lymphatic: Extensive Aortoiliac calcified atherosclerosis. Major arterial structures remain patent. No lymphadenopathy. Early portal venous timing on the initial phase, on the delayed images the main portal venous structures appear to be patent. Reproductive: Negative. Other: No pelvic free fluid. Musculoskeletal: Moderate to severe dextroconvex lumbar scoliosis and widespread lumbar, lower thoracic spine degeneration. Chronic lower lumbar spondylolisthesis. Chronic right posterior rib fractures appear stable. But there is  a non healed transverse fracture of the sacrum through the S3 level (sagittal images 56 through 61) which is now minimally displaced, but likely subacute. Superimposed bilateral sacral ala insufficiency fractures with sclerosis, stable. SI joints remain intact. Chronic fractures of the right pubic rami, bordering the anterior right acetabulum again noted. Previous left femur ORIF. IMPRESSION: 1. A transverse fracture through the Sacrum at S3 is mildly displaced, but appears subacute, and is superimposed on multiple chronic sacral and pelvic fractures. Query associated S3 to S5 sacral radiculopathy. 2. Marked urinary bladder distension (920 mL). Query urinary retention. 3. No other acute or inflammatory process identified in the abdomen or pelvis. Aortic Atherosclerosis (ICD10-I70.0). Electronically Signed   By: Odessa Fleming M.D.   On: 05/17/2023 13:04    Assessment & Plan:  .Anemia in stage 3a chronic kidney disease (HCC) -     Basic metabolic panel -     IBC + Ferritin  Decreased GFR -     CBC with Differential/Platelet  At high risk for falls Assessment & Plan: She remains walker dependent   Coffee ground emesis Assessment & Plan: She had an admission for hematemsis; hgb dropped to 7.7 EGD  done June 7 was normal ecept for erythema. Her anticoagulant was stopped and she has not had any subsequent episodes. Repeat hgb on June  24 was done by [facility] and 9.4.  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  She has been referred to psychiatry but has not been scheduled    Seizure disorder [facility]) Assessment & Plan: Secondary to right temporal lobe focus, Now managed with  lamictal since  Breviact was weaned off.   Episodes of parasthesias are often triggered by medication non adherence and use of triggering medications .        I provided 45 minutes of face-to-face time during this encounter reviewing patient's last visit with me, patient's  most recent visit with neurology,  recent surgical and non surgical procedures, previous  labs and imaging studies, counseling on currently addressed issues,  and post visit ordering to diagnostics and therapeutics .   Follow-up: Return in about 4 weeks (around 06/30/2023).   Sherlene Shams, MD

## 2023-06-03 NOTE — Assessment & Plan Note (Signed)
Stable , unaffected by statin suspension .statin resumed     Lab Results  Component Value Date   WBC 4.6 06/02/2023   HGB 8.9 Repeated and verified X2. (L) 06/02/2023   HCT 27.3 (L) 06/02/2023   MCV 92.3 06/02/2023   PLT 143.0 (L) 06/02/2023

## 2023-06-03 NOTE — Assessment & Plan Note (Signed)
Secondary to mental status changes and LE weakness.  Using walker.  Eliquis discontinued

## 2023-06-03 NOTE — Assessment & Plan Note (Signed)
currently rate controlled and has no signs of heart failure on exam.  We have stopped  Eliquis  due to hematoemesis with erythema of gastric lining noted by [facility] in EGD

## 2023-06-03 NOTE — Assessment & Plan Note (Signed)
I have spoken with Dr Sherryll Burger, her neurologist who agrees with stopping Breviact based on recent events and need to reconsider original diagnosis.  He will follow up with patient who is still taking Lamictal

## 2023-06-03 NOTE — Addendum Note (Signed)
Addended by: Sherlene Shams on: 06/03/2023 01:05 PM   Modules accepted: Orders

## 2023-06-03 NOTE — Assessment & Plan Note (Signed)
use of Eliquis  has been stopped due to recent episode of hematemesis

## 2023-06-03 NOTE — Assessment & Plan Note (Signed)
Stable by recent labs.   Lab Results  Component Value Date   WBC 4.6 06/02/2023   HGB 8.9 Repeated and verified X2. (L) 06/02/2023   HCT 27.3 (L) 06/02/2023   MCV 92.3 06/02/2023   PLT 143.0 (L) 06/02/2023

## 2023-06-06 ENCOUNTER — Ambulatory Visit: Payer: Medicare Other

## 2023-06-06 DIAGNOSIS — I428 Other cardiomyopathies: Secondary | ICD-10-CM

## 2023-06-07 LAB — CUP PACEART REMOTE DEVICE CHECK
Battery Impedance: 1341 Ohm
Battery Remaining Longevity: 40 mo
Battery Voltage: 2.77 V
Brady Statistic AP VP Percent: 73 %
Brady Statistic AP VS Percent: 0 %
Brady Statistic AS VP Percent: 27 %
Brady Statistic AS VS Percent: 0 %
Date Time Interrogation Session: 20240705111852
Implantable Lead Connection Status: 753985
Implantable Lead Connection Status: 753985
Implantable Lead Implant Date: 20161205
Implantable Lead Implant Date: 20170321
Implantable Lead Location: 753859
Implantable Lead Location: 753860
Implantable Lead Model: 5076
Implantable Lead Model: 5076
Implantable Pulse Generator Implant Date: 20161205
Lead Channel Impedance Value: 396 Ohm
Lead Channel Impedance Value: 561 Ohm
Lead Channel Pacing Threshold Amplitude: 0.75 V
Lead Channel Pacing Threshold Amplitude: 1.125 V
Lead Channel Pacing Threshold Pulse Width: 0.4 ms
Lead Channel Pacing Threshold Pulse Width: 0.4 ms
Lead Channel Setting Pacing Amplitude: 2.25 V
Lead Channel Setting Pacing Amplitude: 2.5 V
Lead Channel Setting Pacing Pulse Width: 0.4 ms
Lead Channel Setting Sensing Sensitivity: 4 mV
Zone Setting Status: 755011
Zone Setting Status: 755011

## 2023-06-12 DIAGNOSIS — R4689 Other symptoms and signs involving appearance and behavior: Secondary | ICD-10-CM

## 2023-06-19 ENCOUNTER — Other Ambulatory Visit: Payer: Self-pay | Admitting: Cardiovascular Disease

## 2023-06-19 ENCOUNTER — Telehealth: Payer: Self-pay

## 2023-06-19 DIAGNOSIS — L97919 Non-pressure chronic ulcer of unspecified part of right lower leg with unspecified severity: Secondary | ICD-10-CM

## 2023-06-19 NOTE — Telephone Encounter (Signed)
Encounter not needed

## 2023-06-23 ENCOUNTER — Telehealth: Payer: Self-pay | Admitting: Cardiovascular Disease

## 2023-06-23 NOTE — Telephone Encounter (Signed)
Magda Paganini with [facility] wants to confirm that radiology report from last week was received.

## 2023-06-23 NOTE — Telephone Encounter (Signed)
[name] [name] has been made aware that the ABI report has been scanned into the patient's chart.

## 2023-06-25 NOTE — Progress Notes (Signed)
Remote pacemaker transmission.   

## 2023-06-26 ENCOUNTER — Ambulatory Visit (INDEPENDENT_AMBULATORY_CARE_PROVIDER_SITE_OTHER): Payer: Medicare Other | Admitting: Internal Medicine

## 2023-06-26 ENCOUNTER — Encounter: Payer: Self-pay | Admitting: Internal Medicine

## 2023-06-26 VITALS — BP 108/76 | HR 71 | Ht 65.0 in | Wt 150.2 lb

## 2023-06-26 DIAGNOSIS — N1831 Chronic kidney disease, stage 3a: Secondary | ICD-10-CM

## 2023-06-26 DIAGNOSIS — Z66 Do not resuscitate: Secondary | ICD-10-CM

## 2023-06-26 DIAGNOSIS — F0390 Unspecified dementia without behavioral disturbance: Secondary | ICD-10-CM | POA: Diagnosis not present

## 2023-06-26 DIAGNOSIS — R600 Localized edema: Secondary | ICD-10-CM | POA: Diagnosis not present

## 2023-06-26 DIAGNOSIS — R197 Diarrhea, unspecified: Secondary | ICD-10-CM

## 2023-06-26 DIAGNOSIS — Z9181 History of falling: Secondary | ICD-10-CM | POA: Diagnosis not present

## 2023-06-26 LAB — BASIC METABOLIC PANEL
BUN: 34 mg/dL — ABNORMAL HIGH (ref 6–23)
CO2: 33 mEq/L — ABNORMAL HIGH (ref 19–32)
Calcium: 10.5 mg/dL (ref 8.4–10.5)
Chloride: 101 mEq/L (ref 96–112)
Creatinine, Ser: 1.51 mg/dL — ABNORMAL HIGH (ref 0.40–1.20)
GFR: 31.96 mL/min — ABNORMAL LOW (ref >60.00)
Glucose, Bld: 93 mg/dL (ref 70–99)
Potassium: 3.6 mEq/L (ref 3.5–5.1)
Sodium: 142 mEq/L (ref 135–145)

## 2023-06-26 LAB — MAGNESIUM: Magnesium: 2.3 mg/dL (ref 1.5–2.5)

## 2023-06-26 MED ORDER — MONTELUKAST SODIUM 10 MG PO TABS: 10.0000 mg | ORAL_TABLET | Freq: Every day | ORAL | 1 refills | Status: DC

## 2023-06-26 NOTE — Patient Instructions (Signed)
I am prescribing furosemide to be taken DAILY FOR 3 DAYS, then EVERY OTHER DAY

## 2023-06-26 NOTE — Progress Notes (Signed)
Subjective:  Patient ID: [name] [name], female    DOB: [birth year]  Age: 82 y.o. MRN: [id number]  CC: The primary encounter diagnosis was Bilateral edema of lower extremity. 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   HPI [name] [name] presents for  Chief Complaint  Patient presents with   Medical Management of Chronic Issues    1 month follow up    1) Psychosis  : remains in Memory unit of [facility].  SHE IS ACCOMPANIED BY HER SON and is Awaiting follow up with Dr Sherryll Burger.   She appears much more calm since daily use of zyprexa has been continued.  She continues to request copies of her medication list and requests to return to independnet living.   Patient reports being woken up at 6 am for meds and being given meds and liquids while  lying supine   2) Frequent falls: most recent fall occurred because patient  slid out of bed while attempting to don compression stockings without assistance.  Facial bruises noted.   3)  dyspnea with exertion  .  Sh has had very  little therapeutic exercise since transitioning to Memory Care and is now short of breath with minimal exertion    4)  recent episode of diarrhea lasting several days, now resolved  5) Bilateral leg edema.  Not wearing stockings today 2+ pitting edema noted      Outpatient Medications Prior to Visit  Medication Sig Dispense Refill   acetaminophen (TYLENOL) 325 MG tablet Take 650 mg by mouth every 4 (four) hours as needed for mild pain or moderate pain.     ARIPiprazole (ABILIFY) 10 MG tablet Take 10 mg by mouth every evening.     bismuth subsalicylate (PEPTO BISMOL) 262 MG/15ML suspension Take 30 mLs by mouth every 6  (six) hours as needed.     fluticasone (FLONASE) 50 MCG/ACT nasal spray Place 1 spray into both nostrils daily.     furosemide (LASIX) 40 MG tablet Take 40 mg by mouth daily as needed for fluid or edema.     gabapentin (NEURONTIN) 100 MG capsule Take 200 mg by mouth 3 (three) times daily.     Iron, Ferrous Sulfate, 325 (65 Fe) MG TABS Take 1 tablet by mouth daily.     lamoTRIgine (LAMICTAL) 150 MG tablet Take 150 mg by mouth daily.     lamoTRIgine (LAMICTAL) 200 MG tablet Take 200 mg by mouth at bedtime.     metaxalone (SKELAXIN) 800 MG tablet Take 800 mg by mouth 3 (three) times daily as needed for muscle spasms.     metoprolol tartrate (LOPRESSOR) 25 MG tablet Take 25 mg by mouth daily.     midodrine (PROAMATINE) 2.5 MG tablet Take 2.5 mg by mouth 3 (three) times daily with meals.     OLANZapine (ZYPREXA) 5 MG tablet Take 5 mg by mouth at bedtime.     ondansetron (ZOFRAN) 8 MG tablet Take 8 mg by mouth every 8 (eight) hours as needed for nausea or vomiting.     pantoprazole (PROTONIX) 40 MG tablet Take 1 tablet (40 mg total) by mouth 2 (two) times daily before a meal. 60 tablet 0   polyethylene glycol (MIRALAX / GLYCOLAX) 17 g  packet Take 17 g by mouth daily as needed for mild constipation or moderate constipation.     rosuvastatin (CRESTOR) 40 MG tablet Take 1 tablet (40 mg total) by mouth daily. 90 tablet 3   VENTOLIN HFA 108 (90 Base) MCG/ACT inhaler INHALE 2 PUFFS INTO THE LUNGS EVERY 6 HOURS AS NEEDED FOR WHEEZING OR SHORTNESS OF BREATH 18 g 1   montelukast (SINGULAIR) 10 MG tablet TAKE ONE TABLET BY MOUTH AT BEDTIME 90 tablet 1   OLANZapine (ZYPREXA) 2.5 MG tablet Take 2.5 mg by mouth at bedtime. (Patient not taking: Reported on 06/26/2023)     No facility-administered medications prior to visit.    Review of Systems;  Patient denies headache, fevers, malaise, unintentional weight loss, skin rash, eye pain, sinus congestion and sinus pain, sore throat, dysphagia,  hemoptysis , cough,   wheezing, chest pain, palpitations, orthopnea,  abdominal pain, nausea, melena, diarrhea, constipation, flank pain, dysuria, hematuria, urinary  Frequency, nocturia, numbness, tingling, seizures,  Focal weakness, Loss of consciousness,  Tremor, insomnia, depression, and suicidal ideation.      Objective:  BP 108/76   Pulse 71   Ht 5\' 5"  (1.651 m)   Wt 150 lb 3.2 oz (68.1 kg)   SpO2 97%   BMI 24.99 kg/m   BP Readings from Last 3 Encounters:  06/26/23 108/76  06/02/23 (!) 90/50  05/21/23 114/71    Wt Readings from Last 3 Encounters:  06/26/23 150 lb 3.2 oz (68.1 kg)  06/02/23 143 lb (64.9 kg)  05/21/23 137 lb (62.1 kg)    Physical Exam Vitals reviewed.  Constitutional:      General: She is not in acute distress.    Appearance: Normal appearance. She is normal weight. She is not ill-appearing, toxic-appearing or diaphoretic.  HENT:     Head: Normocephalic.  Eyes:     General: No scleral icterus.       Right eye: No discharge.        Left eye: No discharge.     Conjunctiva/sclera: Conjunctivae normal.  Cardiovascular:     Rate and Rhythm: Normal rate and regular rhythm.     Heart sounds: Normal heart sounds.  Pulmonary:     Effort: Pulmonary effort is normal. No respiratory distress.     Breath sounds: Normal breath sounds.  Musculoskeletal:        General: Normal range of motion.     Right lower leg: Edema present.     Left lower leg: Edema present.  Skin:    General: Skin is warm and dry.  Neurological:     General: No focal deficit present.     Mental Status: She is alert and oriented to person, place, and time. Mental status is at baseline.  Psychiatric:        Mood and Affect: Mood normal.        Behavior: Behavior normal.        Thought Content: Thought content normal.        Judgment: Judgment normal.     Lab Results  Component Value Date   HGBA1C 5.5 02/11/2020   HGBA1C 5.7 05/21/2019   HGBA1C 5.3 04/07/2018    Lab Results  Component Value Date    CREATININE 1.51 (H) 06/26/2023   CREATININE 1.26 (H) 06/02/2023   CREATININE 1.1 05/26/2023    Lab Results  Component Value Date   WBC 4.6 06/02/2023   HGB 8.9 Repeated and verified X2. (L) 06/02/2023   HCT 27.3 (L) 06/02/2023  PLT 143.0 (L) 06/02/2023   GLUCOSE 93 06/26/2023   CHOL 143 05/21/2019   TRIG 60.0 05/21/2019   HDL 59.50 05/21/2019   LDLDIRECT 113.0 02/15/2016   LDLCALC 71 05/21/2019   ALT 15 05/26/2023   AST 26 05/26/2023   NA 142 06/26/2023   K 3.6 06/26/2023   CL 101 06/26/2023   CREATININE 1.51 (H) 06/26/2023   BUN 34 (H) 06/26/2023   CO2 33 (H) 06/26/2023   TSH 4.842 (H) 04/29/2023   INR 1.2 06/18/2021   HGBA1C 5.5 02/11/2020   MICROALBUR 2.8 (H) 11/13/2021    CT HEAD WO CONTRAST ( )  Result Date: 05/18/2023 CLINICAL DATA:  Delirium. EXAM: CT HEAD WITHOUT CONTRAST TECHNIQUE: Contiguous axial images were obtained from the base of the skull through the vertex without intravenous contrast. RADIATION DOSE REDUCTION: This exam was performed according to the departmental dose-optimization program which includes automated exposure control, adjustment of the mA and/or kV according to patient size and/or use of iterative reconstruction technique. COMPARISON:  CT head without contrast 04/29/2023 FINDINGS: Brain: Mild atrophy and white matter changes are similar the prior study. No acute infarct, hemorrhage, or mass lesion is present. Deep brain nuclei are within normal limits. The ventricles are of normal size. No significant extraaxial fluid collection is present. The brainstem and cerebellum are within normal limits. Midline structures are within normal limits. Vascular: Atherosclerotic calcifications are present within the cavernous internal carotid arteries bilaterally. No hyperdense vessel is present. Skull: A left parietal scalp hematoma is present. Underlying fracture or foreign body is present. Sinuses/Orbits: The paranasal sinuses and mastoid air cells are clear.  Right lens replacement is present. Globes and orbits are otherwise within normal limits. IMPRESSION: 1. Left parietal scalp hematoma without underlying fracture or foreign body. 2. Stable mild atrophy and white matter disease. This likely reflects the sequela of chronic microvascular ischemia. 3. No acute intracranial abnormality or significant interval change. Electronically Signed   By: Marin Roberts M.D.   On: 05/18/2023 17:45   CT ABDOMEN PELVIS W CONTRAST  Result Date: 05/17/2023 CLINICAL DATA:  83 year old female with abdominal pain, falls, bruising to the abdomen and back. EXAM: CT ABDOMEN AND PELVIS WITH CONTRAST TECHNIQUE: Multidetector CT imaging of the abdomen and pelvis was performed using the standard protocol following bolus administration of intravenous contrast. RADIATION DOSE REDUCTION: This exam was performed according to the departmental dose-optimization program which includes automated exposure control, adjustment of the mA and/or kV according to patient size and/or use of iterative reconstruction technique. CONTRAST:  OMNIPAQUE IOHEXOL 300 MG/ML  SOLN COMPARISON:  CT Abdomen and Pelvis 04/23/2023. FINDINGS: Lower chest: Partially visible cardiomegaly appears stable. No pericardial or pleural effusion. Partially visible cardiac pacer lead. Hepatobiliary: Numerous chronic hepatic cysts have not significantly changed since 2017 and are benign (no follow-up imaging recommended). Diminutive, negative gallbladder. Pancreas: Within normal limits. Spleen: Negative. Adrenals/Urinary Tract: Normal adrenal glands. Nonobstructed kidneys. Occasional chronic renal cysts also present in 2017 (no follow-up imaging recommended). Symmetric renal enhancement and contrast excretion with no hydronephrosis or hydroureter. But the urinary bladder is markedly distended today (sagittal image 63). Estimated bladder volume 920 mL. Stomach/Bowel: No dilated large or small bowel. Retained stool throughout  the colon. Normal appendix on series 2, image 43. Decompressed stomach. No free air or free fluid. Vascular/Lymphatic: Extensive Aortoiliac calcified atherosclerosis. Major arterial structures remain patent. No lymphadenopathy. Early portal venous timing on the initial phase, on the delayed images the main portal venous structures appear to be patent. Reproductive:  Negative. Other: No pelvic free fluid. Musculoskeletal: Moderate to severe dextroconvex lumbar scoliosis and widespread lumbar, lower thoracic spine degeneration. Chronic lower lumbar spondylolisthesis. Chronic right posterior rib fractures appear stable. But there is a non healed transverse fracture of the sacrum through the S3 level (sagittal images 56 through 61) which is now minimally displaced, but likely subacute. Superimposed bilateral sacral ala insufficiency fractures with sclerosis, stable. SI joints remain intact. Chronic fractures of the right pubic rami, bordering the anterior right acetabulum again noted. Previous left femur ORIF. IMPRESSION: 1. A transverse fracture through the Sacrum at S3 is mildly displaced, but appears subacute, and is superimposed on multiple chronic sacral and pelvic fractures. Query associated S3 to S5 sacral radiculopathy. 2. Marked urinary bladder distension (920 mL). Query urinary retention. 3. No other acute or inflammatory process identified in the abdomen or pelvis. Aortic Atherosclerosis (ICD10-I70.0). Electronically Signed   By: Odessa Fleming M.D.   On: 05/17/2023 13:04    Assessment & Plan:  .Bilateral edema of lower extremity -     Basic metabolic panel  Diarrhea, unspecified type -     Magnesium  At high risk for falls Assessment & Plan: She remains walker dependent and has had a recent fall due to unassisted donning of stockings while sitting on edge of bed.  She sustained blunt trauma to the face when her head hit the floor. 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    Zyprexa has been employed to reduce the nocturnal disturbances and her electronic media have been restricted .  She has been referred to psychiatry b and is awaiting and appt with  Dr. Maryruth Bun .     Chronic kidney disease (CKD) stage G3a/A2, moderately decreased glomerular filtration rate (GFR) between 45-59 mL/min/1.73 square meter and albuminuria creatinine ratio between 30-299 mg/g (HCC) Assessment & Plan: GFR has dropped to 31 ml/min likely due to recent bout of diarrhea .  Will encouarge hydration and dc use of furosemide after 3 days    Do not resuscitate status -     Do not attempt resuscitation (DNR)  Other orders -     Montelukast Sodium; Take 1 tablet (10 mg total) by mouth at bedtime.  Dispense: 90 tablet; Refill: 1     I provided 30 minutes of face-to-face time during this encounter reviewing patient's last visit with me, patient's  most recent hospitalization  . Visit with cardiology and  neurology,  recent surgical and non surgical procedures, previous  labs and imaging studies, counseling on currently addressed issues,  and post visit ordering to diagnostics and therapeutics .   Follow-up: Return in about 3 months (around 09/26/2023).   Sherlene Shams, MD

## 2023-06-27 ENCOUNTER — Telehealth: Payer: Self-pay | Admitting: *Deleted

## 2023-06-27 ENCOUNTER — Encounter: Payer: Self-pay | Admitting: Internal Medicine

## 2023-06-27 NOTE — Telephone Encounter (Signed)
Pt last Prolia was on 10/16/22. Appt in May was cancelled by patient due to possible seizures.  I made a note on 06/02/23 to dicsuss Prolia alternative during OV.  Per records, looks like pt is now in memory care.  Please advise if okay to Archive Prolia chart.

## 2023-06-27 NOTE — Assessment & Plan Note (Signed)
She continues to exhibit agitation, paranoia, and delusions  despite dc of  Briviact .    Zyprexa has been employed to reduce the nocturnal disturbances and her electronic media have been restricted .  She has been referred to psychiatry b and is awaiting and appt with  Dr. Maryruth Bun .

## 2023-06-27 NOTE — Assessment & Plan Note (Signed)
She remains walker dependent and has had a recent fall due to unassisted donning of stockings while sitting on edge of bed.  She sustained blunt trauma to the face when her head hit the floor. She is not competent for live independently due to her frailty and inability to assess for saftey

## 2023-06-27 NOTE — Assessment & Plan Note (Signed)
GFR has dropped to 31 ml/min likely due to recent bout of diarrhea .  Will encouarge hydration and dc use of furosemide after 3 days

## 2023-06-30 NOTE — Telephone Encounter (Signed)
Called and spoke with [name] [name] and informed her of the following from Dr. Mariah Milling.  ABIs look normal TG   [name] [name] verbalized understanding.

## 2023-07-01 ENCOUNTER — Encounter: Payer: Self-pay | Admitting: Medical

## 2023-07-01 ENCOUNTER — Ambulatory Visit: Payer: Medicare Other | Attending: Cardiology | Admitting: Medical

## 2023-07-01 VITALS — BP 110/72 | HR 85 | Ht 65.0 in | Wt 155.4 lb

## 2023-07-01 DIAGNOSIS — Z79899 Other long term (current) drug therapy: Secondary | ICD-10-CM | POA: Diagnosis present

## 2023-07-01 DIAGNOSIS — I495 Sick sinus syndrome: Secondary | ICD-10-CM | POA: Diagnosis present

## 2023-07-01 DIAGNOSIS — I471 Supraventricular tachycardia, unspecified: Secondary | ICD-10-CM | POA: Diagnosis present

## 2023-07-01 DIAGNOSIS — I5022 Chronic systolic (congestive) heart failure: Secondary | ICD-10-CM | POA: Insufficient documentation

## 2023-07-01 DIAGNOSIS — I480 Paroxysmal atrial fibrillation: Secondary | ICD-10-CM | POA: Diagnosis not present

## 2023-07-01 DIAGNOSIS — R600 Localized edema: Secondary | ICD-10-CM | POA: Diagnosis not present

## 2023-07-01 DIAGNOSIS — Z950 Presence of cardiac pacemaker: Secondary | ICD-10-CM | POA: Insufficient documentation

## 2023-07-01 DIAGNOSIS — I872 Venous insufficiency (chronic) (peripheral): Secondary | ICD-10-CM | POA: Diagnosis not present

## 2023-07-01 DIAGNOSIS — E782 Mixed hyperlipidemia: Secondary | ICD-10-CM | POA: Insufficient documentation

## 2023-07-01 MED ORDER — FUROSEMIDE 40 MG PO TABS: 40.0000 mg | ORAL_TABLET | ORAL | 6 refills | Status: DC

## 2023-07-01 NOTE — Progress Notes (Signed)
Cardiology Office Note:    Date:  07/01/2023   ID:  [name] [name], DOB [birth year], MRN [id number]  PCP:  Sherlene Shams, MD  [facility] Cardiologist:  Julien Nordmann, MD  [facility] Electrophysiologist:  Lewayne Bunting, MD   Referring MD: Sherlene Shams, MD   Chief Complaint: 3 month follow-up  History of Present Illness:    [name] [name] is a 82 y.o. 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  Previous heart cath in 11/206 showed no CAD. This was performed as part of MVR work-up and maze procedure in 2016.   She was lst seen 03/2023 reporting increase in swelling that was asymmetric. She was taking lasix 2 days a week. Increasing lasix was discussed, patient was concerned about her kidney function. She was instructed to increase it to 3 times a week with plan for close follow-up.  Today, the patient reports she has been falling a lot. She was moved into an area for dementia patients. She was putting on her support stockings and slipped off the bed. She denies chest pain or shortness of breath. She says swelling is about the same. She is wearing compression socks. Based off labs on 7/25 lasix was changed to every other day per PCP.   Past Medical History:  Diagnosis Date   Acute on chronic diastolic (congestive) heart failure (HCC)    Allergy    See list   Altered mental status 04/23/2023   Anemia 2018   Anxiety    Occasionally take Xanax for sleep   Anxiety associated with depression    Prn alprazolam    Anxiety associated with depression    Arthritis    Hands, Back   Atrial  fibrillation, persistent (HCC)    DCCV 08/22/2015   Bradycardia post-op bradycardia, pacer dependent   MDT PPM 11/06/15, Dr. Ladona Ridgel   Cataract    Left eye   Chronic kidney disease (CKD) stage G3a/A2, moderately decreased glomerular filtration rate (GFR) between 45-59 mL/min/1.73 square meter and albuminuria creatinine ratio between 30-299 mg/g (HCC) 11/13/2021   Closed bilateral fracture of pubic rami (HCC) 08/28/2022   Diverticulosis    Focal seizures (HCC)    Heart murmur    Hematuria, gross 04/09/2018   Hemorrhoid    Hepatic cyst    innumerable   History of asbestos exposure    Hyperlipidemia    IBS (irritable bowel syndrome)    Idiopathic thrombocytopenic purpura (ITP) (HCC)    Migraine    MVP (mitral valve prolapse)    Near syncope 01/26/2023   Nodule of right lung    Osteoporosis of forearm    RBBB    Restrictive lung disease    Mild on PFT & likely cardiac in etiology    Right sided sciatica 04/10/2022   S/P Minimally invasive maze operation for atrial fibrillation 10/31/2015   Complete bilateral atrial lesion set using cryothermy and bipolar radiofrequency ablation with clipping of LA appendage via right mini thoracotomy approach   S/P minimally invasive mitral valve replacement with bioprosthetic valve 10/31/2015   33 mm [facility] Mitral bovine bioprosthetic tissue valve placed via right mini thoracotomy approach   Seizures (HCC)  left foot paralysis and left hand paralysis Dr. 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   SVT (supraventricular tachycardia)    Thoracic aorta atherosclerosis (HCC)    TIA (transient ischemic attack)    Visit for preventive health examination 05/23/2016    Past Surgical History:  Procedure Laterality Date   BREAST EXCISIONAL BIOPSY Right Late 80s   Negative X2   CARDIAC CATHETERIZATION N/A 10/18/2015   Procedure: Right/Left Heart Cath and Coronary Angiography;  Surgeon: Tonny Bollman, MD;  Location: [facility] INVASIVE CV LAB;  Service: Cardiovascular;  Laterality: N/A;   CARDIOVERSION N/A 08/22/2015   Procedure: CARDIOVERSION;  Surgeon: Vesta Mixer, MD;  Location: [facility] ENDOSCOPY;  Service: Cardiovascular;  Laterality: N/A;   COLONOSCOPY  2003   EP IMPLANTABLE DEVICE N/A 11/06/2015   Procedure: Pacemaker Implant;  Surgeon: Marinus Maw, MD;  Location: [facility] INVASIVE CV LAB;  Service: Cardiovascular;  Laterality: N/A;   EP IMPLANTABLE DEVICE N/A 02/20/2016   Procedure: Lead Extraction;  Surgeon: Marinus Maw, MD;  Location: [facility] INVASIVE CV LAB;  Service: Cardiovascular;  Laterality: N/A;   ESOPHAGOGASTRODUODENOSCOPY (EGD) WITH PROPOFOL N/A 05/19/2023   Procedure: ESOPHAGOGASTRODUODENOSCOPY (EGD) WITH PROPOFOL;  Surgeon: Midge Minium, MD;  Location: [facility] ENDOSCOPY;  Service: Endoscopy;  Laterality: N/A;   EYE SURGERY Right 2013   FEMUR IM NAIL Left 06/18/2021   Procedure: INTRAMEDULLARY (IM) NAIL FEMORAL, OPEN REDUCTION INTERNAL FIXATION FEMUR RIGHT LITTLE FINGER PIP CLOSED REDUCTION;  Surgeon: Myrene Galas, MD;  Location: [facility] OR;  Service: Orthopedics;  Laterality: Left;   FOOT SURGERY     ~2007 right foot bunion   MANDIBLE FRACTURE SURGERY  03/26/2013   MINIMALLY INVASIVE MAZE PROCEDURE N/A 10/31/2015   Procedure: MINIMALLY INVASIVE MAZE PROCEDURE;  Surgeon: Purcell Nails, MD;  Location: [facility] OR;  Service: Open Heart Surgery;  Laterality: N/A;   MITRAL VALVE REPLACEMENT Right 10/31/2015   Procedure: MINIMALLY INVASIVE MITRAL VALVE (MV) REPLACEMENT;  Surgeon: Purcell Nails, MD;  Location: [facility] OR;  Service: Open Heart Surgery;  Laterality: Right;   PACEMAKER LEAD REMOVAL  02/20/2016   TEE WITH CARDIOVERSION     TEE WITHOUT CARDIOVERSION N/A 08/22/2015   Procedure: TRANSESOPHAGEAL ECHOCARDIOGRAM (TEE);  Surgeon: Vesta Mixer, MD;  Location: [facility] ENDOSCOPY;  Service: Cardiovascular;  Laterality: N/A;   TEE WITHOUT CARDIOVERSION N/A 10/31/2015   Procedure: TRANSESOPHAGEAL  ECHOCARDIOGRAM (TEE);  Surgeon: Purcell Nails, MD;  Location: [facility] OR;  Service: Open Heart Surgery;  Laterality: N/A;   TUBAL LIGATION     VARICOSE VEIN SURGERY Right     Current Medications: Current Meds  Medication Sig   acetaminophen (TYLENOL) 325 MG tablet Take 650 mg by mouth every 4 (four) hours as needed for mild pain or moderate pain.   ARIPiprazole (ABILIFY) 10 MG tablet Take 10 mg by mouth every evening.   bismuth subsalicylate (PEPTO BISMOL) 262 MG/15ML suspension Take 30 mLs by mouth every 6 (six) hours as needed.   fluticasone (FLONASE) 50 MCG/ACT nasal spray Place 1 spray into both nostrils daily.   gabapentin (NEURONTIN) 100 MG capsule Take 200 mg by mouth 3 (three) times daily.   Iron, Ferrous Sulfate, 325 (65 Fe) MG TABS Take 1 tablet by mouth daily.   lamoTRIgine (LAMICTAL) 200 MG tablet Take 200 mg by mouth at bedtime.   metoprolol tartrate (LOPRESSOR) 25 MG tablet Take 25 mg by mouth daily.   midodrine (PROAMATINE) 2.5 MG tablet  Take 2.5 mg by mouth 3 (three) times daily with meals.   montelukast (SINGULAIR) 10 MG tablet Take 1 tablet (10 mg total) by mouth at bedtime.   OLANZapine (ZYPREXA) 5 MG tablet Take 5 mg by mouth at bedtime.   ondansetron (ZOFRAN) 8 MG tablet Take 8 mg by mouth every 8 (eight) hours as needed for nausea or vomiting.   pantoprazole (PROTONIX) 40 MG tablet Take 1 tablet (40 mg total) by mouth 2 (two) times daily before a meal.   polyethylene glycol (MIRALAX / GLYCOLAX) 17 g packet Take 17 g by mouth daily as needed for mild constipation or moderate constipation.   rosuvastatin (CRESTOR) 40 MG tablet Take 1 tablet (40 mg total) by mouth daily.   VENTOLIN HFA 108 (90 Base) MCG/ACT inhaler INHALE 2 PUFFS INTO THE LUNGS EVERY 6 HOURS AS NEEDED FOR WHEEZING OR SHORTNESS OF BREATH   [DISCONTINUED] furosemide (LASIX) 40 MG tablet Take 40 mg by mouth daily as needed for fluid or edema.     Allergies:   Codeine, Epinephrine, Hydrocodone, Hydromorphone,  Molds & smuts, Oxycodone, Meloxicam, Codeine, Dextromethorphan, Diphen [diphenhydramine hcl], Diphenhydramine, Diphenhydramine hcl, Diphenylpyraline, Doxycycline, Doxycycline, Hydrocodone, Hydromorphone, Meloxicam, Mobic [meloxicam], Nsaids, Nsaids, Oxycodone, Propofol, and Propofol   Social History   Socioeconomic History   Marital status: Widowed    Spouse name: Deceased   Number of children: 2   Years of education: 51   Highest education level: Master's degree (e.g., MA, MS, MEng, MEd, MSW, MBA)  Occupational History   Occupation: Retired  Tobacco Use   Smoking status: Never   Smokeless tobacco: Never  Vaping Use   Vaping status: Never Used  Substance and Sexual Activity   Alcohol use: No    Alcohol/week: 3.0 standard drinks of alcohol    Types: 3 Standard drinks or equivalent per week   Drug use: No   Sexual activity: Not Currently  Other Topics Concern   Not on file  Social History Narrative   ** Merged History Encounter **       She is a widow.  Husband died from metastatic renal cell cancer. Originally from [place]. Previously lived in [place] from 1975-[place]. Moved to [place] in 2007. No mold exposure recently but did have it through a prior work exposure in 1993. Has a masters in    public health. No bird exposure.  [facility] (09/29/17): She has moved into a retirement community since last appointment. She reports she had testing at her new residence that was positive for mold. It has since been treated.    Social Determinants of Health   Financial Resource Strain: Low Risk  (03/17/2023)   Overall Financial Resource Strain (CARDIA)    Difficulty of Paying Living Expenses: Not hard at all  Food Insecurity: No Food Insecurity (05/17/2023)   Hunger Vital Sign    Worried About Running Out of Food in the Last Year: Never true    Ran Out of Food in the Last Year: Never true  Transportation Needs: No Transportation Needs (05/17/2023)   PRAPARE - Doctor, general practice (Medical): No    Lack of Transportation (Non-Medical): No  Physical Activity: Unknown (03/17/2023)   Exercise Vital Sign    Days of Exercise per Week: Patient declined    Minutes of Exercise per Session: Not on file  Stress: No Stress Concern Present (03/17/2023)   [employer] of Occupational Health - Occupational Stress Questionnaire    Feeling of Stress : Only a  little  Social Connections: Unknown (03/17/2023)   Social Connection and Isolation Panel [NHANES]    Frequency of Communication with Friends and Family: Patient declined    Frequency of Social Gatherings with Friends and Family: Once a week    Attends Religious Services: Patient declined    Database administrator or Organizations: Yes    Attends Banker Meetings: Patient declined    Marital Status: Widowed     Family History: The patient's family history includes Arrhythmia in her brother and mother; Atrial fibrillation in her son; Celiac disease in her son; Heart failure in her mother; Hypertension in her mother; Liver cancer in her maternal grandmother; Prostate cancer in her brother; Stroke in her father; Stroke (age of onset: 53) in her brother; Stroke (age of onset: 27) in her maternal aunt. There is no history of Heart attack or Breast cancer.  ROS:   Please see the history of present illness.     All other systems reviewed and are negative.  EKGs/Labs/Other Studies Reviewed:    The following studies were reviewed today:  Echo 12/2022   1. Left ventricular ejection fraction, by estimation, is 35 to 40%. The  left ventricle has moderately decreased function. The left ventricle  demonstrates global hypokinesis. There is mild left ventricular  hypertrophy. Left ventricular diastolic  parameters are indeterminate.   2. Right ventricular systolic function is mildly reduced. The right  ventricular size is severely enlarged.   3. Left atrial size was mild to moderately dilated.   4.  Right atrial size was severely dilated.   5. The mitral valve has been repaired/replaced. No evidence of mitral  valve regurgitation. There is a 33 mm Edwards bioprosthetic valve present  in the mitral position. Procedure Date: 10/2015.   6. Tricuspid valve regurgitation is mild to moderate.   7. The aortic valve is tricuspid. Aortic valve regurgitation is not  visualized.   8. The inferior vena cava is dilated in size with <50% respiratory  variability, suggesting right atrial pressure of 15 mmHg.    EKG:  EKG is  ordered today.  The ekg ordered today demonstrates AV paced rhythm  Recent Labs: 04/29/2023: TSH 4.842 05/26/2023: ALT 15 06/02/2023: Hemoglobin 8.9 Repeated and verified X2.; Platelets 143.0 06/26/2023: BUN 34; Creatinine, Ser 1.51; Magnesium 2.3; Potassium 3.6; Sodium 142  Recent Lipid Panel    Component Value Date/Time   CHOL 143 05/21/2019 0816   CHOL 162 12/05/2014 0848   TRIG 60.0 05/21/2019 0816   HDL 59.50 05/21/2019 0816   HDL 61 12/05/2014 0848   CHOLHDL 2 05/21/2019 0816   VLDL 12.0 05/21/2019 0816   LDLCALC 71 05/21/2019 0816   LDLCALC 85 12/05/2014 0848   LDLDIRECT 113.0 02/15/2016 0930     Physical Exam:    VS:  BP 110/72 (BP Location: Left Arm, Patient Position: Sitting)   Pulse 85   Ht 5\' 5"  (1.651 m)   Wt 155 lb 6.4 oz (70.5 kg)   SpO2 92%   BMI 25.86 kg/m     Wt Readings from Last 3 Encounters:  07/01/23 155 lb 6.4 oz (70.5 kg)  06/26/23 150 lb 3.2 oz (68.1 kg)  06/02/23 143 lb (64.9 kg)     GEN:  Well nourished, well developed in no acute distress HEENT: Normal NECK: No JVD; No carotid bruits LYMPHATICS: No lymphadenopathy CARDIAC: RRR, no murmurs, rubs, gallops RESPIRATORY:  Clear to auscultation without rales, wheezing or rhonchi  ABDOMEN: Soft, non-tender, non-distended MUSCULOSKELETAL:  2-3+ lower leg edema; No deformity  SKIN: Warm and dry NEUROLOGIC:  Alert and oriented x 3 PSYCHIATRIC:  Normal affect   ASSESSMENT:    1.  PAF (paroxysmal atrial fibrillation) (HCC)   2. Lower leg edema   3. Venous insufficiency   4. Chronic systolic heart failure (HCC)   5. Medication management   6. Hyperlipidemia, mixed   7. SVT (supraventricular tachycardia)   8. Sinus node dysfunction (HCC)   9. Cardiac pacemaker in situ    PLAN:    In order of problems listed above:  Paroxysmal Afib S/p MAZE and LAA clip. Continue Eliquis 5mg  BID CHADSVASC of 6. Continue Toprol for rate control.  Chronic LLE NICM HFrEF LVEF 35-40% on most recent echocardiogram 12/2022. Patient previously did not tolerate Entresto. PCP Recently changed lasix to 40mg  every other day given dehydration on most recent labs.On exam, patient has 2-3+ lower leg edema, suspect some if this is chronic. She has compression socks on. I will check albumin and BNP. I will refer to vascular to evaluate for venous insufficiency. continue Toprol, Losartan, lasix and spironolactone.  HLD LDL 71 in 2020. Continue Crestor 40mg  daily.   H/o SVT S/p ablation. Continue BB therapy.  Sss s/p PPM Followed by EP.   Disposition: Follow up in 3 month(s) with MD/APP    Signed, Victorious Cosio David Stall, PA-C  07/01/2023 3:58 PM    [facility]

## 2023-07-01 NOTE — Patient Instructions (Signed)
Medication Instructions:  Your physician recommends the following medication changes.  INCREASE: Lasix to 40 mg by mouth every other day  *If you need a refill on your cardiac medications before your next appointment, please call your pharmacy*   Lab Work: Your provider would like for you to have lab work today (BNP,  albumin) the return in 2 weeks for BMP.   Please go to [facility] [address] ([facility] [address], [place] [address] You do not need an appointment.  They are open from 7:30 am-4 pm.  Lunch from 1:00 pm- 2:00 pm You will not need to be fasting.   You may also go to any of these [facility] locations:  [employer]  - [address] Chief Technology Officer)   If you have labs (blood work) drawn today and your tests are completely normal, you will receive your results only by: [employer] (if you have MyChart) OR A paper copy in the mail If you have any lab test that is abnormal or we need to change your treatment, we will call you to review the results.   Testing/Procedures: None ordered today   Follow-Up: At [facility], you and your health needs are our priority.  As part of our continuing mission to provide you with exceptional heart care, we have created designated Provider Care Teams.  These Care Teams include your primary Cardiologist (physician) and Advanced Practice Providers (APPs -  Physician Assistants and Nurse Practitioners) who all work together to provide you with the care you need, when you need it.  We recommend signing up for the patient portal called "MyChart".  Sign up information is provided on this After Visit Summary.  MyChart is used to connect with patients for Virtual Visits (Telemedicine).  Patients are able to view lab/test results, encounter notes, upcoming appointments, etc.  Non-urgent messages can be sent to your provider as well.   To learn more about what you can do with MyChart, go  to [email or web address].    Your next appointment:   3 month(s)  Provider:   You may see Julien Nordmann, MD or one of the following Advanced Practice Providers on your designated Care Team:   Nicolasa Ducking, NP Eula Listen, PA-C Cadence Fransico Michael, PA-C Charlsie Quest, NP

## 2023-07-07 ENCOUNTER — Other Ambulatory Visit: Payer: Self-pay

## 2023-07-07 DIAGNOSIS — I5022 Chronic systolic (congestive) heart failure: Secondary | ICD-10-CM

## 2023-07-09 ENCOUNTER — Encounter: Payer: Self-pay | Admitting: *Deleted

## 2023-07-09 NOTE — Telephone Encounter (Signed)
Unread mychart message mailed  

## 2023-07-11 ENCOUNTER — Telehealth: Payer: Self-pay

## 2023-07-11 NOTE — Telephone Encounter (Signed)
This message is being sent to Doc of the Day.  Magda Paganini called from [facility] to state they sent a fax to [place] two days ago regarding a consult patient had with cardiology.  Magda Paganini states they have not heard back from [place], so she is following up.  Magda Paganini states today patient has been delirious, "going to [place]", climbing out of the chair, testing theories about bathrooms and Christmas lights.  Magda Paganini states patient has been difficult to manage.  Magda Paganini states she would like to know if Dr. Duncan Dull can prescribe something to calm patient like ativan, just a prn order to calm her.  I let Magda Paganini know that Dr. Darrick Huntsman is not here today.

## 2023-07-11 NOTE — Telephone Encounter (Signed)
FYI    TT,  I have advised this patient go to ED for evaluation in regards to potential delirium, infection.  I know you know this patient better than I.     I am not sure what the son will decide to do however wanted you to be aware.

## 2023-07-11 NOTE — Telephone Encounter (Signed)
Spoke to [place] @ [facility] [phone] per Claris Che and advised them to take pt to the ED. Magda Paganini stated she would call pt son and see what they want to do because they do not want her hospitalized

## 2023-07-15 MED ORDER — LORAZEPAM 0.5 MG PO TABS: 0.5000 mg | ORAL_TABLET | Freq: Two times a day (BID) | ORAL | 1 refills | Status: DC | PRN

## 2023-07-15 NOTE — Telephone Encounter (Signed)
LVM to call back to office  

## 2023-07-15 NOTE — Telephone Encounter (Signed)
[name] [name],   See call from last week regarding confusion  I advised ED .  Can you check on patient? Doesn't appear she went to ED

## 2023-07-15 NOTE — Telephone Encounter (Signed)
Spoke to son [name] and he stated that Mom did not go to ED they himself and the nurses did not feel like it was warranted that she go and be seen.

## 2023-07-15 NOTE — Telephone Encounter (Signed)
Spoke with Magda Paganini at [facility] to let her know that Lorazepam has been sent in and that I have over the order from Dr. Darrick Huntsman.

## 2023-07-16 ENCOUNTER — Encounter: Payer: Self-pay | Admitting: Student

## 2023-07-16 ENCOUNTER — Non-Acute Institutional Stay: Payer: Medicare Other | Admitting: Student

## 2023-07-16 ENCOUNTER — Other Ambulatory Visit: Payer: Self-pay | Admitting: Internal Medicine

## 2023-07-16 DIAGNOSIS — F03B2 Unspecified dementia, moderate, with psychotic disturbance: Secondary | ICD-10-CM | POA: Diagnosis not present

## 2023-07-16 DIAGNOSIS — I959 Hypotension, unspecified: Secondary | ICD-10-CM | POA: Diagnosis not present

## 2023-07-16 DIAGNOSIS — I5022 Chronic systolic (congestive) heart failure: Secondary | ICD-10-CM

## 2023-07-16 DIAGNOSIS — R296 Repeated falls: Secondary | ICD-10-CM

## 2023-07-16 DIAGNOSIS — I7409 Other arterial embolism and thrombosis of abdominal aorta: Secondary | ICD-10-CM | POA: Diagnosis not present

## 2023-07-16 DIAGNOSIS — I872 Venous insufficiency (chronic) (peripheral): Secondary | ICD-10-CM

## 2023-07-16 DIAGNOSIS — F444 Conversion disorder with motor symptom or deficit: Secondary | ICD-10-CM

## 2023-07-16 NOTE — Progress Notes (Unsigned)
Location:  Other [place].  Nursing Home Room Number: [facility] [address] Place of Service:  ALF [phone]) Provider:  Dr. Earnestine Mealing  PCP: Sherlene Shams, MD  Patient Care Team: Sherlene Shams, MD as PCP - General (Internal Medicine) Marinus Maw, MD as PCP - Electrophysiology (Cardiology) Antonieta Iba, MD as PCP - Cardiology (Cardiology) Lemar Livings Merrily Pew, MD (General Surgery) Sherlene Shams, MD (Internal Medicine) Sherlene Shams, MD (Internal Medicine) Toney Reil, MD as Consulting Physician (Gastroenterology)  Extended Emergency Contact Information Primary Emergency Contact: Brosseau,Brett Address: [address]          [place], [place] [address] D[place] of [place] Mobile Phone: [phone] Relation: Son  Code Status:  Full Code Goals of care: Advanced Directive information    07/16/2023   12:53 PM  Advanced Directives  Does Patient Have a Medical Advance Directive? Yes  Type of Estate agent of [place];Living will  Does patient want to make changes to medical advance directive? No - Patient declined  Copy of Healthcare Power of Attorney in Chart? Yes - validated most recent copy scanned in chart (See row information)     Chief Complaint  Patient presents with  . Medical Management of Chronic Issues    Medical Management of Chronic Issues.     HPI:  Pt is a 82 y.o. female seen today for medical management of chronic diseases.   Patient is not responding with eyes pointed to ceiling not speaking.   Spoke with patient's son/HCPOA regarding concern that patient is not responding but vital signs are stable. Patient has done this 2 other times in the past and she has been "playing possum." They sit in the ED for 8 hours and she has a negative work up. He would prefer we try to wake her up here and states she is scared of MRIs and talking about them will help her snap out of it.   Return to room and state, "Jennarose you  may have to get an MRI" and she starts following commands. She drinks 4 ounces of water. Grimaces to pain, but does not speak. She opens her mouth to have water poured in and swallows without issue.    Past Medical History:  Diagnosis Date  . Acute on chronic diastolic (congestive) heart failure (HCC)   . Allergy    See list  . Altered mental status 04/23/2023  . Anemia 2018  . Anxiety    Occasionally take Xanax for sleep  . Anxiety associated with depression    Prn alprazolam   . Anxiety associated with depression   . Arthritis    Hands, Back  . Atrial fibrillation, persistent (HCC)    DCCV 08/22/2015  . Bradycardia post-op bradycardia, pacer dependent   MDT PPM 11/06/15, Dr. Ladona Ridgel  . Cataract    Left eye  . Chronic kidney disease (CKD) stage G3a/A2, moderately decreased glomerular filtration rate (GFR) between 45-59 mL/min/1.73 square meter and albuminuria creatinine ratio between 30-299 mg/g (HCC) 11/13/2021  . Closed bilateral fracture of pubic rami (HCC) 08/28/2022  . Diverticulosis   . Focal seizures (HCC)   . Heart murmur   . Hematuria, gross 04/09/2018  . Hemorrhoid   . Hepatic cyst    innumerable  . 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 Hyperlipidemia   . IBS (irritable bowel syndrome)   . 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 Migraine   . MVP (mitral valve prolapse)   .  Near syncope 01/26/2023  . Nodule of right lung   . Osteoporosis of forearm   . RBBB   . Restrictive lung disease    Mild on PFT & likely cardiac in etiology   . Right sided sciatica 04/10/2022  . S/P Minimally invasive maze operation for atrial fibrillation 10/31/2015   Complete bilateral atrial lesion set using cryothermy and bipolar radiofrequency ablation with clipping of LA appendage via right mini thoracotomy approach  . S/P minimally invasive mitral valve replacement with bioprosthetic valve 10/31/2015   33 mm [facility] Mitral bovine bioprosthetic tissue valve placed via  right mini thoracotomy approach  . Seizures (HCC)    left foot paralysis and left hand paralysis Dr. Sherryll Burger  . Severe mitral regurgitation   . 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 SVT (supraventricular tachycardia)   . Thoracic aorta atherosclerosis (HCC)   . TIA (transient ischemic attack)   . Visit for preventive health examination 05/23/2016   Past Surgical History:  Procedure Laterality Date  . BREAST EXCISIONAL BIOPSY Right Late 80s   Negative X2  . CARDIAC CATHETERIZATION N/A 10/18/2015   Procedure: Right/Left Heart Cath and Coronary Angiography;  Surgeon: Tonny Bollman, MD;  Location[facility] INVASIVE CV LAB;  Service: Cardiovascular;  Laterality: N/A;  . CARDIOVERSION N/A 08/22/2015   Procedure: CARDIOVERSION;  Surgeon: Vesta Mixer, MD;  Location: [facility] ENDOSCOPY;  Service: Cardiovascular;  Laterality: N/A;  . COLONOSCOPY  2003  . EP IMPLANTABLE DEVICE N/A 11/06/2015   Procedure: Pacemaker Implant;  Surgeon: Marinus Maw, MD;  Location: [facility] INVASIVE CV LAB;  Service: Cardiovascular;  Laterality: N/A;  . EP IMPLANTABLE DEVICE N/A 02/20/2016   Procedure: Lead Extraction;  Surgeon: Marinus Maw, MD;  Location: [facility] INVASIVE CV LAB;  Service: Cardiovascular;  Laterality: N/A;  . ESOPHAGOGASTRODUODENOSCOPY (EGD) WITH PROPOFOL N/A 05/19/2023   Procedure: ESOPHAGOGASTRODUODENOSCOPY (EGD) WITH PROPOFOL;  Surgeon: Midge Minium, MD;  Location: [facility] ENDOSCOPY;  Service: Endoscopy;  Laterality: N/A;  . EYE SURGERY Right 2013  . FEMUR IM NAIL Left 06/18/2021   Procedure: INTRAMEDULLARY (IM) NAIL FEMORAL, OPEN REDUCTION INTERNAL FIXATION FEMUR RIGHT LITTLE FINGER PIP CLOSED REDUCTION;  Surgeon: Myrene Galas, MD;  Location: [facility] OR;  Service: Orthopedics;  Laterality: Left;  . FOOT SURGERY     ~2007 right foot bunion  . MANDIBLE FRACTURE SURGERY  03/26/2013  . MINIMALLY INVASIVE MAZE PROCEDURE N/A 10/31/2015   Procedure: MINIMALLY INVASIVE MAZE PROCEDURE;  Surgeon: Purcell Nails, MD;  Location: [facility] OR;  Service: Open Heart Surgery;  Laterality: N/A;  . MITRAL VALVE REPLACEMENT Right 10/31/2015   Procedure: MINIMALLY INVASIVE MITRAL VALVE (MV) REPLACEMENT;  Surgeon: Purcell Nails, MD;  Location: [facility] OR;  Service: Open Heart Surgery;  Laterality: Right;  . PACEMAKER LEAD REMOVAL  02/20/2016  . TEE WITH CARDIOVERSION    . TEE WITHOUT CARDIOVERSION N/A 08/22/2015   Procedure: TRANSESOPHAGEAL ECHOCARDIOGRAM (TEE);  Surgeon: Vesta Mixer, MD;  Location: [facility] ENDOSCOPY;  Service: Cardiovascular;  Laterality: N/A;  . TEE WITHOUT CARDIOVERSION N/A 10/31/2015   Procedure: TRANSESOPHAGEAL ECHOCARDIOGRAM (TEE);  Surgeon: Purcell Nails, MD;  Location: [facility] OR;  Service: Open Heart Surgery;  Laterality: N/A;  . TUBAL LIGATION    . VARICOSE VEIN SURGERY Right     Allergies  Allergen Reactions  . Codeine Nausea And Vomiting and Other (See Comments)    migraine  . Epinephrine Palpitations and Shortness Of Breath  . Hydrocodone Nausea And Vomiting and Other (See  Comments)    MIGRAINE  . Hydromorphone Nausea And Vomiting and Other (See Comments)    migraine  . Molds & Smuts Anxiety, Other (See Comments) and Shortness Of Breath  . Oxycodone Nausea And Vomiting and Other (See Comments)    Severe migraine  . Meloxicam Other (See Comments)    Severe reflux  . Codeine Other (See Comments)    Migraine  . Dextromethorphan Other (See Comments)    seizures  . Diphen [Diphenhydramine Hcl] Other (See Comments)    seizure  . Diphenhydramine Other (See Comments)    seizure  . Diphenhydramine Hcl     Other reaction(s): Other (See Comments)  . Diphenylpyraline Other (See Comments)  . Doxycycline Itching, Swelling and Other (See Comments)    Facial  . Doxycycline Itching and Swelling  . Hydrocodone Other (See Comments)    Migraine  . Hydromorphone Other (See Comments)    Migraine  . Meloxicam Other (See Comments)  . Mobic [Meloxicam] Other (See Comments)    reflux  .  Nsaids   . Nsaids Other (See Comments)    Pt on blood thinner  . Oxycodone Other (See Comments)    Migraine  . Propofol Other (See Comments)  . Propofol Other (See Comments)    Very sensitive; patient stated she was told she was told she had apnea    Outpatient Encounter Medications as of 07/16/2023  Medication Sig  . acetaminophen (TYLENOL) 325 MG tablet Take 650 mg by mouth every 4 (four) hours as needed for mild pain or moderate pain.  . ARIPiprazole (ABILIFY) 10 MG tablet Take 10 mg by mouth every evening.  . bismuth subsalicylate (PEPTO BISMOL) 262 MG/15ML suspension Take 30 mLs by mouth every 6 (six) hours as needed.  . fluticasone (FLONASE) 50 MCG/ACT nasal spray Place 1 spray into both nostrils daily.  . furosemide (LASIX) 40 MG tablet Take 1 tablet (40 mg total) by mouth every other day.  . gabapentin (NEURONTIN) 100 MG capsule Take 200 mg by mouth 3 (three) times daily.  . Iron, Ferrous Sulfate, 325 (65 Fe) MG TABS Take 1 tablet by mouth daily.  Marland Kitchen lamoTRIgine (LAMICTAL) 150 MG tablet Take 150 mg by mouth daily.  Marland Kitchen lamoTRIgine (LAMICTAL) 200 MG tablet Take 200 mg by mouth at bedtime.  Marland Kitchen LORazepam (ATIVAN) 0.5 MG tablet Take 0.5 mg by mouth daily as needed for anxiety.  . metoprolol tartrate (LOPRESSOR) 25 MG tablet Take 25 mg by mouth daily.  . midodrine (PROAMATINE) 2.5 MG tablet Take 2.5 mg by mouth 3 (three) times daily with meals. Give one tablet by mouth daily as needed for low BP  . montelukast (SINGULAIR) 10 MG tablet Take 1 tablet (10 mg total) by mouth at bedtime.  Marland Kitchen OLANZapine (ZYPREXA) 5 MG tablet Take 5 mg by mouth at bedtime. Give one tablet by mouth as needed for delusions and agitated restlessness  . ondansetron (ZOFRAN) 8 MG tablet Take 8 mg by mouth every 8 (eight) hours as needed for nausea or vomiting.  . pantoprazole (PROTONIX) 40 MG tablet Take 1 tablet (40 mg total) by mouth 2 (two) times daily before a meal.  . polyethylene glycol (MIRALAX / GLYCOLAX) 17 g  packet Take 17 g by mouth daily as needed for mild constipation or moderate constipation.  . rosuvastatin (CRESTOR) 40 MG tablet Take 1 tablet (40 mg total) by mouth daily.  . VENTOLIN HFA 108 (90 Base) MCG/ACT inhaler INHALE 2 PUFFS INTO THE LUNGS EVERY 6 HOURS AS  NEEDED FOR WHEEZING OR SHORTNESS OF BREATH  . [DISCONTINUED] LORazepam (ATIVAN) 0.5 MG tablet Take 1 tablet (0.5 mg total) by mouth 2 (two) times daily as needed for anxiety (or agitation). (Patient taking differently: Take 0.5 mg by mouth daily as needed for anxiety (or agitation).)  . [DISCONTINUED] metaxalone (SKELAXIN) 800 MG tablet Take 800 mg by mouth 3 (three) times daily as needed for muscle spasms. (Patient not taking: Reported on 07/01/2023)  . [DISCONTINUED] OLANZapine (ZYPREXA) 2.5 MG tablet Take 2.5 mg by mouth at bedtime. (Patient not taking: Reported on 06/26/2023)   No facility-administered encounter medications on file as of 07/16/2023.    Review of Systems  Immunization History  Administered Date(s) Administered  . Fluad Quad(high Dose 65+) 08/06/2019, 08/25/2020, 08/23/2021, 08/28/2022  . Hep A / Hep B 05/09/2016, 06/11/2016, 11/12/2016  . Influenza Split 10/10/2013  . Influenza Whole 10/10/2011  . Influenza, High Dose Seasonal PF 08/19/2016, 09/05/2017  . Influenza,inj,Quad PF,6+ Mos 08/01/2014, 08/04/2015  . Influenza-Unspecified 09/05/2017, 09/17/2018, 09/21/2019  . Moderna Covid-19 Vaccine Bivalent Booster 66yrs & up 08/31/2021  . Moderna Sars-Covid-2 Vaccination 12/16/2019, 01/13/2020, 10/17/2020, 03/16/2021  . Pneumococcal Conjugate-13 01/10/2014  . Pneumococcal Polysaccharide-23 06/25/2012  . Tdap 06/25/2012  . Zoster Recombinant(Shingrix) 04/24/2017, 08/26/2017  . Zoster, Live 01/10/2006   Pertinent  Health Maintenance Due  Topic Date Due  . INFLUENZA VACCINE  07/03/2023  . DEXA SCAN  Completed      08/28/2022    4:24 PM 10/03/2022    1:56 PM 02/03/2023   11:01 AM 05/08/2023   12:37 PM 06/26/2023     8:56 AM  Fall Risk  Falls in the past year? 1  1 1 1   Was there an injury with Fall? 1  1 1 1   Fall Risk Category Calculator 2  3 3 3   Fall Risk Category (Retired) Moderate      (RETIRED) Patient Fall Risk Level High fall risk High fall risk     Patient at Risk for Falls Due to History of fall(s);Impaired balance/gait  History of fall(s) History of fall(s) History of fall(s)  Fall risk Follow up Falls evaluation completed  Falls evaluation completed Falls evaluation completed Falls evaluation completed   Functional Status Survey:    Vitals:   07/16/23 1243  BP: 105/67  Pulse: 73  Resp: 20  Temp: (!) 96.3 F (35.7 C)  SpO2: 95%  Weight: 154 lb 6.4 oz (70 kg)  Height: 5\' 5"  (1.651 m)   Body mass index is 25.69 kg/m. Physical Exam Constitutional:      Comments: Looking at ceiling  Eyes:     Pupils: Pupils are equal, round, and reactive to light.     Comments: Starts closing eyes with bright light in eyes  Cardiovascular:     Rate and Rhythm: Normal rate and regular rhythm.     Pulses: Normal pulses.  Pulmonary:     Effort: Pulmonary effort is normal.     Breath sounds: Normal breath sounds.  Abdominal:     General: Abdomen is flat.     Tenderness: Rebound: p.  Neurological:     Mental Status: She is alert.    Labs reviewed: Recent Labs    04/29/23 1500 05/17/23 1201 05/19/23 0410 05/20/23 0330 05/26/23 0000 06/02/23 1201 06/26/23 0930  NA 141   < > 137 136 137 141 142  K 3.2*   < > 3.5 3.6 3.4* 3.9 3.6  CL 102   < > 103 104  --  102  101  CO2 28   < > 25 26  --  28 33*  GLUCOSE 99   < > 95 84  --  86 93  BUN 29*   < > 19 18 22* 29* 34*  CREATININE 1.51*   < > 0.96 0.87 1.1 1.26* 1.51*  CALCIUM 9.4   < > 8.7* 8.3*  --  10.1 10.5  MG 2.1  --  2.2  --   --   --  2.3   < > = values in this interval not displayed.   Recent Labs    04/23/23 0127 04/29/23 1501 05/17/23 1201 05/26/23 0000 07/01/23 1436  AST 50* 33 47* 26  --   ALT 27 24 26 15   --    ALKPHOS 111 109 123 129*  --   BILITOT 1.0 0.9 1.5*  --   --   PROT 6.5 6.1* 6.8  --   --   ALBUMIN 4.2 3.7 4.3  --  4.3   Recent Labs    04/17/23 0529 04/19/23 1331 04/23/23 0127 04/29/23 1500 05/19/23 0410 05/20/23 0330 05/26/23 0000 06/02/23 1201  WBC 3.4*   < > 4.3   < > 4.9 4.1  --  4.6  NEUTROABS 2.2  --  3.1  --   --   --   --  3.1  HGB 9.0*   < > 9.8*   < > 8.3* 7.7* 9.4* 8.9 Repeated and verified X2.*  HCT 28.6*   < > 31.2*   < > 27.0* 24.7*  --  27.3*  MCV 95.0   < > 95.1   < > 95.4 95.4  --  92.3  PLT 91*   < > 107*   < > 111* 109* 201 143.0*   < > = values in this interval not displayed.   Lab Results  Component Value Date   TSH 4.842 (H) 04/29/2023   Lab Results  Component Value Date   HGBA1C 5.5 02/11/2020   Lab Results  Component Value Date   CHOL 143 05/21/2019   HDL 59.50 05/21/2019   LDLCALC 71 05/21/2019   LDLDIRECT 113.0 02/15/2016   TRIG 60.0 05/21/2019   CHOLHDL 2 05/21/2019    Significant Diagnostic Results in last 30 days:  No results found.  Assessment/Plan There are no diagnoses linked to this encounter.   Family/ staff Communication: ***  Labs/tests ordered:  ***

## 2023-07-17 ENCOUNTER — Encounter: Payer: Self-pay | Admitting: Medical

## 2023-07-17 DIAGNOSIS — I7409 Other arterial embolism and thrombosis of abdominal aorta: Secondary | ICD-10-CM | POA: Insufficient documentation

## 2023-07-18 ENCOUNTER — Telehealth: Payer: Self-pay | Admitting: Student

## 2023-07-18 ENCOUNTER — Telehealth: Payer: Self-pay

## 2023-07-18 DIAGNOSIS — R062 Wheezing: Secondary | ICD-10-CM

## 2023-07-18 MED ORDER — ALBUTEROL SULFATE (2.5 MG/3ML) 0.083% IN NEBU
2.5000 mg | INHALATION_SOLUTION | Freq: Four times a day (QID) | RESPIRATORY_TRACT | 1 refills | Status: DC | PRN
Start: 2023-07-18 — End: 2023-11-28

## 2023-07-18 NOTE — Telephone Encounter (Signed)
BMP from today with Potassium 4.1. Continue Daily supplement x3 days. F/u BMP on Monday. All other labs stable.

## 2023-07-18 NOTE — Telephone Encounter (Signed)
Attempted to call pt to sched referral apt. No answer.lvm with call back number.

## 2023-07-18 NOTE — Telephone Encounter (Signed)
Patient has labs pending from this morning.   She has had poor swallowing today - honey thickened liquids. Speech consult ordered.   Breathing 100% on room air.   CXR ordered.   F/u on Monday.

## 2023-07-19 ENCOUNTER — Telehealth: Payer: Self-pay | Admitting: Adult Health

## 2023-07-19 DIAGNOSIS — J181 Lobar pneumonia, unspecified organism: Secondary | ICD-10-CM

## 2023-07-19 DIAGNOSIS — I5022 Chronic systolic (congestive) heart failure: Secondary | ICD-10-CM

## 2023-07-19 MED ORDER — AMOXICILLIN-POT CLAVULANATE 875-125 MG PO TABS: 1.0000 | ORAL_TABLET | Freq: Two times a day (BID) | ORAL | Status: DC

## 2023-07-19 MED ORDER — AZITHROMYCIN 250 MG PO TABS: ORAL_TABLET | ORAL | Status: DC

## 2023-07-19 NOTE — Telephone Encounter (Signed)
Nurse called to report CXR which is showing a right mid and lower lobe infiltrate and CHF with edema.  Pt is wheezing but no acute distress. Currently using nebs. Nurse reports allergy to doxycycline.  Zithromax and Augmentin prescribed The nurse reported that the lasix was on hold. Our system indicates she takes 40 mg every other day.  May be on hold due to fluctuating BP per nurse. BP improved after midodrine now 130/61.  This is apparently a chronic issue. Pt does have a hx of elevated BNP. Concern for fluid overload but also is  on thickened liquids and has reduced intake Will prescribe Lasix 40 mg x 1 dose and have provider f/u on Monday regarding treatment of volume status.

## 2023-07-21 ENCOUNTER — Encounter: Payer: Self-pay | Admitting: Student

## 2023-07-21 ENCOUNTER — Telehealth: Payer: Self-pay | Admitting: Student

## 2023-07-21 ENCOUNTER — Non-Acute Institutional Stay: Payer: Medicare Other | Admitting: Student

## 2023-07-21 DIAGNOSIS — N1831 Chronic kidney disease, stage 3a: Secondary | ICD-10-CM | POA: Diagnosis not present

## 2023-07-21 DIAGNOSIS — Z515 Encounter for palliative care: Secondary | ICD-10-CM

## 2023-07-21 DIAGNOSIS — I5043 Acute on chronic combined systolic (congestive) and diastolic (congestive) heart failure: Secondary | ICD-10-CM | POA: Diagnosis not present

## 2023-07-21 DIAGNOSIS — R34 Anuria and oliguria: Secondary | ICD-10-CM

## 2023-07-21 DIAGNOSIS — Z978 Presence of other specified devices: Secondary | ICD-10-CM

## 2023-07-21 MED ORDER — MORPHINE SULFATE (CONCENTRATE) 20 MG/ML PO SOLN
5.0000 mg | ORAL | 0 refills | Status: DC | PRN
Start: 2023-07-21 — End: 2023-07-21

## 2023-07-21 NOTE — Telephone Encounter (Signed)
Received call from nurse in the facility that patient's family would prefer no opioids given her history of poor tolerance. Also discontinue scheduled zyprexa at this time.

## 2023-07-21 NOTE — Progress Notes (Signed)
Location:  Other [facility] Room Number: [facility] [address] Place of Service:  ALF [phone]) Provider:  Dr. Earnestine Mealing  PCP: Sherlene Shams, MD  Patient Care Team: Sherlene Shams, MD as PCP - General (Internal Medicine) Marinus Maw, MD as PCP - Electrophysiology (Cardiology) Antonieta Iba, MD as PCP - Cardiology (Cardiology) Lemar Livings Merrily Pew, MD (General Surgery) Sherlene Shams, MD (Internal Medicine) Sherlene Shams, MD (Internal Medicine) Toney Reil, MD as Consulting Physician (Gastroenterology)  Extended Emergency Contact Information Primary Emergency Contact: Pocock,Brett Address: [address]          [place], [place] [address] [place] of [place] Mobile Phone: [phone] Relation: Son  Code Status:  Full Code Goals of care: Advanced Directive information    07/21/2023    9:07 AM  Advanced Directives  Does Patient Have a Medical Advance Directive? Yes  Type of Estate agent of [place];Living will  Does patient want to make changes to medical advance directive? No - Patient declined  Copy of Healthcare Power of Attorney in Chart? Yes - validated most recent copy scanned in chart (See row information)     Chief Complaint  Patient presents with   Acute Visit    Aspiration.     HPI:  Pt is a 82 y.o. female seen today for an acute visit for Aspiration.   Patient is laying in her bed, answers yes to wanting to get out of bed and eat breakfast. Otherwise nonsensical repetitive phrases.   Spoke with daughter [name] with concern that patient's BNP increased to >5000 over the last 2 weeks and she has had minimal urine output which could reflect cardio-renal syndrome or other issues. Discussed concern that she may need hospitalization for IV diuretics, however, family would like to consider her overall functional status. Discussed possibility of hospice versus transition to hospital. She would like to speak  with her brother before a final decision. Messaged PCP who agrees this could be cardiac vs clots forming given discontinuation of AC with recent GIB.   Received notice from nursing that family would like to transition to hospice at this time.    Past Medical History:  Diagnosis Date   Acute on chronic diastolic (congestive) heart failure (HCC)    Allergy    See list   Altered mental status 04/23/2023   Anemia 2018   Anxiety    Occasionally take Xanax for sleep   Anxiety associated with depression    Prn alprazolam    Anxiety associated with depression    Arthritis    Hands, Back   Atrial fibrillation, persistent (HCC)    DCCV 08/22/2015   Bradycardia post-op bradycardia, pacer dependent   MDT PPM 11/06/15, Dr. Ladona Ridgel   Cataract    Left eye   Chronic kidney disease (CKD) stage G3a/A2, moderately decreased glomerular filtration rate (GFR) between 45-59 mL/min/1.73 square meter and albuminuria creatinine ratio between 30-299 mg/g (HCC) 11/13/2021   Closed bilateral fracture of pubic rami (HCC) 08/28/2022   Diverticulosis    Focal seizures (HCC)    Heart murmur    Hematuria, gross 04/09/2018   Hemorrhoid    Hepatic cyst    innumerable   History of asbestos exposure    Hyperlipidemia    IBS (irritable bowel syndrome)    Idiopathic thrombocytopenic purpura (ITP) (HCC)    Migraine    MVP (mitral valve prolapse)    Near syncope 01/26/2023   Nodule of right lung  Osteoporosis of forearm    RBBB    Restrictive lung disease    Mild on PFT & likely cardiac in etiology    Right sided sciatica 04/10/2022   S/P Minimally invasive maze operation for atrial fibrillation 10/31/2015   Complete bilateral atrial lesion set using cryothermy and bipolar radiofrequency ablation with clipping of LA appendage via right mini thoracotomy approach   S/P minimally invasive mitral valve replacement with bioprosthetic valve 10/31/2015   33 mm [facility] Mitral bovine bioprosthetic tissue valve  placed via right mini thoracotomy approach   Seizures (HCC)    left foot paralysis and left hand paralysis Dr. 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   SVT (supraventricular tachycardia)    Thoracic aorta atherosclerosis (HCC)    TIA (transient ischemic attack)    Visit for preventive health examination 05/23/2016   Past Surgical History:  Procedure Laterality Date   BREAST EXCISIONAL BIOPSY Right Late 80s   Negative X2   CARDIAC CATHETERIZATION N/A 10/18/2015   Procedure: Right/Left Heart Cath and Coronary Angiography;  Surgeon: Tonny Bollman, MD;  Location: [facility] INVASIVE CV LAB;  Service: Cardiovascular;  Laterality: N/A;   CARDIOVERSION N/A 08/22/2015   Procedure: CARDIOVERSION;  Surgeon: Vesta Mixer, MD;  Location: [facility] ENDOSCOPY;  Service: Cardiovascular;  Laterality: N/A;   COLONOSCOPY  2003   EP IMPLANTABLE DEVICE N/A 11/06/2015   Procedure: Pacemaker Implant;  Surgeon: Marinus Maw, MD;  Location: [facility] INVASIVE CV LAB;  Service: Cardiovascular;  Laterality: N/A;   EP IMPLANTABLE DEVICE N/A 02/20/2016   Procedure: Lead Extraction;  Surgeon: Marinus Maw, MD;  Location: [facility] INVASIVE CV LAB;  Service: Cardiovascular;  Laterality: N/A;   ESOPHAGOGASTRODUODENOSCOPY (EGD) WITH PROPOFOL N/A 05/19/2023   Procedure: ESOPHAGOGASTRODUODENOSCOPY (EGD) WITH PROPOFOL;  Surgeon: Midge Minium, MD;  Location: [facility] ENDOSCOPY;  Service: Endoscopy;  Laterality: N/A;   EYE SURGERY Right 2013   FEMUR IM NAIL Left 06/18/2021   Procedure: INTRAMEDULLARY (IM) NAIL FEMORAL, OPEN REDUCTION INTERNAL FIXATION FEMUR RIGHT LITTLE FINGER PIP CLOSED REDUCTION;  Surgeon: Myrene Galas, MD;  Location: [facility] OR;  Service: Orthopedics;  Laterality: Left;   FOOT SURGERY     ~2007 right foot bunion   MANDIBLE FRACTURE SURGERY  03/26/2013   MINIMALLY INVASIVE MAZE PROCEDURE N/A 10/31/2015   Procedure: MINIMALLY INVASIVE MAZE PROCEDURE;  Surgeon: Purcell Nails, MD;   Location: [facility] OR;  Service: Open Heart Surgery;  Laterality: N/A;   MITRAL VALVE REPLACEMENT Right 10/31/2015   Procedure: MINIMALLY INVASIVE MITRAL VALVE (MV) REPLACEMENT;  Surgeon: Purcell Nails, MD;  Location: [facility] OR;  Service: Open Heart Surgery;  Laterality: Right;   PACEMAKER LEAD REMOVAL  02/20/2016   TEE WITH CARDIOVERSION     TEE WITHOUT CARDIOVERSION N/A 08/22/2015   Procedure: TRANSESOPHAGEAL ECHOCARDIOGRAM (TEE);  Surgeon: Vesta Mixer, MD;  Location: [facility] ENDOSCOPY;  Service: Cardiovascular;  Laterality: N/A;   TEE WITHOUT CARDIOVERSION N/A 10/31/2015   Procedure: TRANSESOPHAGEAL ECHOCARDIOGRAM (TEE);  Surgeon: Purcell Nails, MD;  Location: [facility] OR;  Service: Open Heart Surgery;  Laterality: N/A;   TUBAL LIGATION     VARICOSE VEIN SURGERY Right     Allergies  Allergen Reactions   Codeine Nausea And Vomiting and Other (See Comments)    migraine   Epinephrine Palpitations and Shortness Of Breath   Hydrocodone Nausea And Vomiting and Other (See Comments)    MIGRAINE   Hydromorphone Nausea And Vomiting and  Other (See Comments)    migraine   Molds & Smuts Anxiety, Other (See Comments) and Shortness Of Breath   Oxycodone Nausea And Vomiting and Other (See Comments)    Severe migraine   Meloxicam Other (See Comments)    Severe reflux   Codeine Other (See Comments)    Migraine   Dextromethorphan Other (See Comments)    seizures   Diphen [Diphenhydramine Hcl] Other (See Comments)    seizure   Diphenhydramine Other (See Comments)    seizure   Diphenhydramine Hcl     Other reaction(s): Other (See Comments)   Diphenylpyraline Other (See Comments)   Doxycycline Itching, Swelling and Other (See Comments)    Facial   Doxycycline Itching and Swelling   Hydrocodone Other (See Comments)    Migraine   Hydromorphone Other (See Comments)    Migraine   Meloxicam Other (See Comments)   Mobic [Meloxicam] Other (See Comments)    reflux   Nsaids    Nsaids Other (See Comments)     Pt on blood thinner   Oxycodone Other (See Comments)    Migraine   Propofol Other (See Comments)   Propofol Other (See Comments)    Very sensitive; patient stated she was told she was told she had apnea    Outpatient Encounter Medications as of 07/21/2023  Medication Sig   acetaminophen (TYLENOL) 325 MG tablet Take 650 mg by mouth every 4 (four) hours as needed for mild pain or moderate pain.   albuterol (PROVENTIL) (2.5 MG/3ML) 0.083% nebulizer solution Take 3 mLs (2.5 mg total) by nebulization every 6 (six) hours as needed for wheezing or shortness of breath.   amoxicillin-clavulanate (AUGMENTIN) 875-125 MG tablet Take 1 tablet by mouth 2 (two) times daily.   ARIPiprazole (ABILIFY) 10 MG tablet Take 10 mg by mouth every evening.   bismuth subsalicylate (PEPTO BISMOL) 262 MG/15ML suspension Take 30 mLs by mouth every 6 (six) hours as needed.   fluticasone (FLONASE) 50 MCG/ACT nasal spray Place 1 spray into both nostrils daily.   furosemide (LASIX) 40 MG tablet Take 1 tablet (40 mg total) by mouth every other day.   gabapentin (NEURONTIN) 100 MG capsule Take 200 mg by mouth 3 (three) times daily.   Iron, Ferrous Sulfate, 325 (65 Fe) MG TABS Take 1 tablet by mouth daily.   lamoTRIgine (LAMICTAL) 150 MG tablet Take 150 mg by mouth daily.   lamoTRIgine (LAMICTAL) 200 MG tablet Take 200 mg by mouth at bedtime.   LORazepam (ATIVAN) 0.5 MG tablet Take 0.5 mg by mouth daily as needed for anxiety.   metoprolol tartrate (LOPRESSOR) 25 MG tablet Take 25 mg by mouth daily.   midodrine (PROAMATINE) 2.5 MG tablet Take 2.5 mg by mouth 3 (three) times daily with meals. Give one tablet by mouth daily as needed for low BP   montelukast (SINGULAIR) 10 MG tablet Take 1 tablet (10 mg total) by mouth at bedtime.   OLANZapine (ZYPREXA) 5 MG tablet Take 5 mg by mouth at bedtime. Give one tablet by mouth as needed for delusions and agitated restlessness   ondansetron (ZOFRAN) 8 MG tablet Take 8 mg by mouth every 8  (eight) hours as needed for nausea or vomiting.   pantoprazole (PROTONIX) 40 MG tablet Take 1 tablet (40 mg total) by mouth 2 (two) times daily before a meal.   polyethylene glycol (MIRALAX / GLYCOLAX) 17 g packet Take 17 g by mouth daily as needed for mild constipation or moderate constipation.  potassium chloride (KLOR-CON) 20 MEQ packet Take 40 mEq by mouth daily.   rosuvastatin (CRESTOR) 40 MG tablet Take 1 tablet (40 mg total) by mouth daily.   VENTOLIN HFA 108 (90 Base) MCG/ACT inhaler INHALE 2 PUFFS INTO THE LUNGS EVERY 6 HOURS AS NEEDED FOR WHEEZING OR SHORTNESS OF BREATH   [DISCONTINUED] azithromycin (ZITHROMAX) 250 MG tablet Take 2 tablets on day 1, then 1 tablet daily on days 2 through 5   No facility-administered encounter medications on file as of 07/21/2023.    Review of Systems  Immunization History  Administered Date(s) Administered   Fluad Quad(high Dose 65+) 08/06/2019, 08/25/2020, 08/23/2021, 08/28/2022   Hep A / Hep B 05/09/2016, 06/11/2016, 11/12/2016   Influenza Split 10/10/2013   Influenza Whole 10/10/2011   Influenza, High Dose Seasonal PF 08/19/2016, 09/05/2017   Influenza,inj,Quad PF,6+ Mos 08/01/2014, 08/04/2015   Influenza-Unspecified 09/05/2017, 09/17/2018, 09/21/2019   Moderna Covid-19 Vaccine Bivalent Booster 82yrs & up 08/31/2021   Moderna Sars-Covid-2 Vaccination 12/16/2019, 01/13/2020, 10/17/2020, 03/16/2021   Pneumococcal Conjugate-13 01/10/2014   Pneumococcal Polysaccharide-23 06/25/2012   Tdap 06/25/2012   Zoster Recombinant(Shingrix) 04/24/2017, 08/26/2017   Zoster, Live 01/10/2006   Pertinent  Health Maintenance Due  Topic Date Due   INFLUENZA VACCINE  07/03/2023   DEXA SCAN  Completed      08/28/2022    4:24 PM 10/03/2022    1:56 PM 02/03/2023   11:01 AM 05/08/2023   12:37 PM 06/26/2023    8:56 AM  Fall Risk  Falls in the past year? 1  1 1 1   Was there an injury with Fall? 1  1 1 1   Fall Risk Category Calculator 2  3 3 3   Fall Risk  Category (Retired) Moderate      (RETIRED) Patient Fall Risk Level High fall risk High fall risk     Patient at Risk for Falls Due to History of fall(s);Impaired balance/gait  History of fall(s) History of fall(s) History of fall(s)  Fall risk Follow up Falls evaluation completed  Falls evaluation completed Falls evaluation completed Falls evaluation completed   Functional Status Survey:    Vitals:   07/21/23 0857  BP: 110/78  Pulse: 93  Resp: 16  Temp: 98.3 F (36.8 C)  SpO2: 95%  Weight: 154 lb 6.4 oz (70 kg)  Height: 5\' 5"  (1.651 m)   Body mass index is 25.69 kg/m. Physical Exam Cardiovascular:     Rate and Rhythm: Normal rate. Rhythm irregular.  Pulmonary:     Effort: Pulmonary effort is normal.  Abdominal:     General: Abdomen is flat.  Musculoskeletal:     Comments: 2+ pitting edema to the hips.   Skin:    Comments: Cold and dry  Healing bruising on the face from previous falls.   Neurological:     Mental Status: She is alert.     Comments: Nonsensical repetition of "Yesterday coming in medicine." "What was yesterday, [name]."     Labs reviewed: Recent Labs    04/29/23 1500 05/17/23 1201 05/19/23 0410 05/20/23 0330 05/26/23 0000 06/02/23 1201 06/26/23 0930  NA 141   < > 137 136 137 141 142  K 3.2*   < > 3.5 3.6 3.4* 3.9 3.6  CL 102   < > 103 104  --  102 101  CO2 28   < > 25 26  --  28 33*  GLUCOSE 99   < > 95 84  --  86 93  BUN 29*   < >  19 18 22* 29* 34*  CREATININE 1.51*   < > 0.96 0.87 1.1 1.26* 1.51*  CALCIUM 9.4   < > 8.7* 8.3*  --  10.1 10.5  MG 2.1  --  2.2  --   --   --  2.3   < > = values in this interval not displayed.   Recent Labs    04/23/23 0127 04/29/23 1501 05/17/23 1201 05/26/23 0000 07/01/23 1436  AST 50* 33 47* 26  --   ALT 27 24 26 15   --   ALKPHOS 111 109 123 129*  --   BILITOT 1.0 0.9 1.5*  --   --   PROT 6.5 6.1* 6.8  --   --   ALBUMIN 4.2 3.7 4.3  --  4.3   Recent Labs    04/17/23 0529 04/19/23 1331  04/23/23 0127 04/29/23 1500 05/19/23 0410 05/20/23 0330 05/26/23 0000 06/02/23 1201  WBC 3.4*   < > 4.3   < > 4.9 4.1  --  4.6  NEUTROABS 2.2  --  3.1  --   --   --   --  3.1  HGB 9.0*   < > 9.8*   < > 8.3* 7.7* 9.4* 8.9 Repeated and verified X2.*  HCT 28.6*   < > 31.2*   < > 27.0* 24.7*  --  27.3*  MCV 95.0   < > 95.1   < > 95.4 95.4  --  92.3  PLT 91*   < > 107*   < > 111* 109* 201 143.0*   < > = values in this interval not displayed.   Lab Results  Component Value Date   TSH 4.842 (H) 04/29/2023   Lab Results  Component Value Date   HGBA1C 5.5 02/11/2020   Lab Results  Component Value Date   CHOL 143 05/21/2019   HDL 59.50 05/21/2019   LDLCALC 71 05/21/2019   LDLDIRECT 113.0 02/15/2016   TRIG 60.0 05/21/2019   CHOLHDL 2 05/21/2019    Significant Diagnostic Results in last 30 days:  No results found.  Assessment/Plan Acute on chronic combined systolic and diastolic CHF (congestive heart failure) (HCC) - Plan: Ambulatory referral to Hospice, morphine (ROXANOL) 20 MG/ML concentrated solution  Chronic kidney disease (CKD) stage G3a/A2, moderately decreased glomerular filtration rate (GFR) between 45-59 mL/min/1.73 square meter and albuminuria creatinine ratio between 30-299 mg/g (HCC)  Oliguria  Foley catheter in place  Hospice care patient - Plan: morphine (ROXANOL) 20 MG/ML concentrated solution Patient should be evaluated in the hospital, however, family defers based on her current status of health. Discussion regarding current status outlined above. Will continue comfort care and send order for hospice to help with comfort measures and grief support for the family.   Family/ staff Communication: Nursing, [name]  Labs/tests ordered:  none  I spent greater than 50  minutes for the care of this patient in face to face time, chart review, clinical documentation, patient education. I spent an additional 16 minutes discussing goals of care and advanced care planning.

## 2023-07-23 NOTE — Telephone Encounter (Signed)
I have tried to reach pt again today by phone with no success. I also have sent mychart message & mailed letters. Still no response.

## 2023-08-05 ENCOUNTER — Inpatient Hospital Stay: Payer: Medicare Other | Admitting: Oncology

## 2023-08-05 ENCOUNTER — Inpatient Hospital Stay: Payer: Medicare Other

## 2023-08-11 ENCOUNTER — Non-Acute Institutional Stay: Payer: Medicare Other | Admitting: Student

## 2023-08-11 ENCOUNTER — Encounter: Payer: Self-pay | Admitting: Student

## 2023-08-11 DIAGNOSIS — I5022 Chronic systolic (congestive) heart failure: Secondary | ICD-10-CM

## 2023-08-11 DIAGNOSIS — R296 Repeated falls: Secondary | ICD-10-CM

## 2023-08-11 DIAGNOSIS — F03B2 Unspecified dementia, moderate, with psychotic disturbance: Secondary | ICD-10-CM

## 2023-08-11 DIAGNOSIS — Z515 Encounter for palliative care: Secondary | ICD-10-CM

## 2023-08-11 DIAGNOSIS — I959 Hypotension, unspecified: Secondary | ICD-10-CM

## 2023-08-11 DIAGNOSIS — N1831 Chronic kidney disease, stage 3a: Secondary | ICD-10-CM | POA: Diagnosis not present

## 2023-08-11 NOTE — Progress Notes (Signed)
Location:  [name] Room Number: [facility] 213A Place of Service:  ALF [phone]) Provider:   Dr. Earnestine Mealing, MD  PCP: Sherlene Shams, MD   Patient Care Team: Sherlene Shams, MD as PCP - General (Internal Medicine) Marinus Maw, MD as PCP - Electrophysiology (Cardiology) Antonieta Iba, MD as PCP - Cardiology (Cardiology) Lemar Livings Merrily Pew, MD (General Surgery) Sherlene Shams, MD (Internal Medicine) Sherlene Shams, MD (Internal Medicine) Toney Reil, MD as Consulting Physician (Gastroenterology)  Extended Emergency Contact Information Primary Emergency Contact: Kliebert,Brett Address: [address]          [place], [place] [address] D[place] of [place] Phone: [phone] Relation: Son  Code Status: DNR Goals of care: Advanced Directive information    08/11/2023    8:44 AM  Advanced Directives  Does Patient Have a Medical Advance Directive? Yes  Type of Estate agent of [place];Living will;Out of facility DNR (pink MOST or yellow form)  Does patient want to make changes to medical advance directive? No - Patient declined  Copy of Healthcare Power of Attorney in Chart? Yes - validated most recent copy scanned in chart (See row information)  Pre-existing out of facility DNR order (yellow form or pink MOST form) Yellow form placed in chart (order not valid for inpatient use)     Chief Complaint  Patient presents with   Medical Management of Chronic Issues    Routine Visit   Immunizations    DTAP, Influenza and Covid.    HPI:  Pt is an 82 y.o. female seen today for medical management of chronic diseases.    She starts with saying thank you for having one of the seizure medications discontinued between myself and Dr. Darrick Huntsman due to her side effects.   Patient has concerns about her progress and return home. She says she has made progress over the last two weeks and wants to work in therapy. She  is aware it is September 2024 and she thinks she was admitted sometime in May. She was admitted after numerous falls and ED visits where her son had her in "the psych part of the hospital which I didn't understand." She states she has not had therapy and has not had sufficient chance. After her femur fracture in 2020, she returned home, and doesn't understand how this is different.   Discussed concern that patient concern that she has had numerous intentional and unintentional setbacks during her stay here. She has had moments of illness with decompensation of CHF and leg swelling, and she has had intentionally Poor PO intake. PT discontinued their services >1 mo ago. Discussed concern that patient had falls prior to admission and was not asking/arranging help, nor did she adequately participate in physical therapy with last round of efforts. Patient has had episodes where she places herself which was observed by nursing.   Called son to discuss concern given patient's episodes of confusion and lack of capacity to appropriately discuss risk and benefits of care. She has had improvement in the last 2 weeks, but this is the longest period of time she has had improvement over the last 4 months. Okay with holding hospice so patient can have a trial of PT. Discussed concern that she does have severe cardiac disease and dementia which are contributing to her waxing and waning of recovery. When patient has gone home alone she fires them shortly after.  Past Medical History:  Diagnosis Date  Acute on chronic diastolic (congestive) heart failure (HCC)    Allergy    See list   Altered mental status 04/23/2023   Anemia 2018   Anxiety    Occasionally take Xanax for sleep   Anxiety associated with depression    Prn alprazolam    Anxiety associated with depression    Arthritis    Hands, Back   Atrial fibrillation, persistent (HCC)    DCCV 08/22/2015   Bradycardia post-op bradycardia, pacer dependent   MDT  PPM 11/06/15, Dr. Ladona Ridgel   Cataract    Left eye   Chronic kidney disease (CKD) stage G3a/A2, moderately decreased glomerular filtration rate (GFR) between 45-59 mL/min/1.73 square meter and albuminuria creatinine ratio between 30-299 mg/g (HCC) 11/13/2021   Closed bilateral fracture of pubic rami (HCC) 08/28/2022   Diverticulosis    Focal seizures (HCC)    Heart murmur    Hematuria, gross 04/09/2018   Hemorrhoid    Hepatic cyst    innumerable   History of asbestos exposure    Hyperlipidemia    IBS (irritable bowel syndrome)    Idiopathic thrombocytopenic purpura (ITP) (HCC)    Migraine    MVP (mitral valve prolapse)    Near syncope 01/26/2023   Nodule of right lung    Osteoporosis of forearm    RBBB    Restrictive lung disease    Mild on PFT & likely cardiac in etiology    Right sided sciatica 04/10/2022   S/P Minimally invasive maze operation for atrial fibrillation 10/31/2015   Complete bilateral atrial lesion set using cryothermy and bipolar radiofrequency ablation with clipping of LA appendage via right mini thoracotomy approach   S/P minimally invasive mitral valve replacement with bioprosthetic valve 10/31/2015   33 mm [facility] Mitral bovine bioprosthetic tissue valve placed via right mini thoracotomy approach   Seizures (HCC)    left foot paralysis and left hand paralysis Dr. 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   SVT (supraventricular tachycardia)    Thoracic aorta atherosclerosis (HCC)    TIA (transient ischemic attack)    Visit for preventive health examination 05/23/2016   Past Surgical History:  Procedure Laterality Date   BREAST EXCISIONAL BIOPSY Right Late 80s   Negative X2   CARDIAC CATHETERIZATION N/A 10/18/2015   Procedure: Right/Left Heart Cath and Coronary Angiography;  Surgeon: Tonny Bollman, MD;  Location: [facility] INVASIVE CV LAB;  Service: Cardiovascular;  Laterality: N/A;   CARDIOVERSION N/A  08/22/2015   Procedure: CARDIOVERSION;  Surgeon: Vesta Mixer, MD;  Location: [facility] ENDOSCOPY;  Service: Cardiovascular;  Laterality: N/A;   COLONOSCOPY  2003   EP IMPLANTABLE DEVICE N/A 11/06/2015   Procedure: Pacemaker Implant;  Surgeon: Marinus Maw, MD;  Location: [facility] INVASIVE CV LAB;  Service: Cardiovascular;  Laterality: N/A;   EP IMPLANTABLE DEVICE N/A 02/20/2016   Procedure: Lead Extraction;  Surgeon: Marinus Maw, MD;  Location: [facility] INVASIVE CV LAB;  Service: Cardiovascular;  Laterality: N/A;   ESOPHAGOGASTRODUODENOSCOPY (EGD) WITH PROPOFOL N/A 05/19/2023   Procedure: ESOPHAGOGASTRODUODENOSCOPY (EGD) WITH PROPOFOL;  Surgeon: Midge Minium, MD;  Location: [facility] ENDOSCOPY;  Service: Endoscopy;  Laterality: N/A;   EYE SURGERY Right 2013   FEMUR IM NAIL Left 06/18/2021   Procedure: INTRAMEDULLARY (IM) NAIL FEMORAL, OPEN REDUCTION INTERNAL FIXATION FEMUR RIGHT LITTLE FINGER PIP CLOSED REDUCTION;  Surgeon: Myrene Galas, MD;  Location: [facility] OR;  Service: Orthopedics;  Laterality: Left;   FOOT SURGERY     ~  2007 right foot bunion   MANDIBLE FRACTURE SURGERY  03/26/2013   MINIMALLY INVASIVE MAZE PROCEDURE N/A 10/31/2015   Procedure: MINIMALLY INVASIVE MAZE PROCEDURE;  Surgeon: Purcell Nails, MD;  Location: [facility] OR;  Service: Open Heart Surgery;  Laterality: N/A;   MITRAL VALVE REPLACEMENT Right 10/31/2015   Procedure: MINIMALLY INVASIVE MITRAL VALVE (MV) REPLACEMENT;  Surgeon: Purcell Nails, MD;  Location: [facility] OR;  Service: Open Heart Surgery;  Laterality: Right;   PACEMAKER LEAD REMOVAL  02/20/2016   TEE WITH CARDIOVERSION     TEE WITHOUT CARDIOVERSION N/A 08/22/2015   Procedure: TRANSESOPHAGEAL ECHOCARDIOGRAM (TEE);  Surgeon: Vesta Mixer, MD;  Location: [facility] ENDOSCOPY;  Service: Cardiovascular;  Laterality: N/A;   TEE WITHOUT CARDIOVERSION N/A 10/31/2015   Procedure: TRANSESOPHAGEAL ECHOCARDIOGRAM (TEE);  Surgeon: Purcell Nails, MD;  Location: [facility] OR;  Service: Open Heart Surgery;   Laterality: N/A;   TUBAL LIGATION     VARICOSE VEIN SURGERY Right     Allergies  Allergen Reactions   Codeine Nausea And Vomiting and Other (See Comments)    migraine   Epinephrine Palpitations and Shortness Of Breath   Hydrocodone Nausea And Vomiting and Other (See Comments)    MIGRAINE   Hydromorphone Nausea And Vomiting and Other (See Comments)    migraine   Molds & Smuts Anxiety, Other (See Comments) and Shortness Of Breath   Oxycodone Nausea And Vomiting and Other (See Comments)    Severe migraine   Meloxicam Other (See Comments)    Severe reflux   Codeine Other (See Comments)    Migraine   Dextromethorphan Other (See Comments)    seizures   Diphen [Diphenhydramine Hcl] Other (See Comments)    seizure   Diphenhydramine Other (See Comments)    seizure   Diphenhydramine Hcl     Other reaction(s): Other (See Comments)   Diphenylpyraline Other (See Comments)   Doxycycline Itching, Swelling and Other (See Comments)    Facial   Doxycycline Itching and Swelling   Hydrocodone Other (See Comments)    Migraine   Hydromorphone Other (See Comments)    Migraine   Meloxicam Other (See Comments)   Mobic [Meloxicam] Other (See Comments)    reflux   Nsaids    Nsaids Other (See Comments)    Pt on blood thinner   Oxycodone Other (See Comments)    Migraine   Propofol Other (See Comments)   Propofol Other (See Comments)    Very sensitive; patient stated she was told she was told she had apnea    Outpatient Encounter Medications as of 08/11/2023  Medication Sig   acetaminophen (TYLENOL) 325 MG tablet Take 650 mg by mouth every 4 (four) hours as needed for mild pain or moderate pain.   albuterol (PROVENTIL) (2.5 MG/3ML) 0.083% nebulizer solution Take 3 mLs (2.5 mg total) by nebulization every 6 (six) hours as needed for wheezing or shortness of breath.   ARIPiprazole (ABILIFY) 10 MG tablet Take 10 mg by mouth every evening.   bismuth subsalicylate (PEPTO BISMOL) 262 MG/15ML  suspension Take 30 mLs by mouth every 6 (six) hours as needed.   fluticasone (FLONASE) 50 MCG/ACT nasal spray Place 1 spray into both nostrils daily.   furosemide (LASIX) 40 MG tablet Take 1 tablet (40 mg total) by mouth every other day.   gabapentin (NEURONTIN) 100 MG capsule Take 200 mg by mouth 3 (three) times daily.   Iron, Ferrous Sulfate, 325 (65 Fe) MG TABS Take 1 tablet by mouth daily.  lamoTRIgine (LAMICTAL) 150 MG tablet Take 150 mg by mouth daily.   lamoTRIgine (LAMICTAL) 200 MG tablet Take 200 mg by mouth at bedtime.   metoprolol tartrate (LOPRESSOR) 25 MG tablet Take 25 mg by mouth daily.   midodrine (PROAMATINE) 2.5 MG tablet Take 2.5 mg by mouth 3 (three) times daily with meals. Give one tablet by mouth daily as needed for low BP   montelukast (SINGULAIR) 10 MG tablet Take 1 tablet (10 mg total) by mouth at bedtime.   OLANZapine (ZYPREXA) 5 MG tablet Take 5 mg by mouth at bedtime.   OLANZapine (ZYPREXA) 5 MG tablet Take 5 mg by mouth as needed (delusions and agitated restlessness).   ondansetron (ZOFRAN) 8 MG tablet Take 8 mg by mouth every 8 (eight) hours as needed for nausea or vomiting.   pantoprazole (PROTONIX) 40 MG tablet Take 1 tablet (40 mg total) by mouth 2 (two) times daily before a meal.   polyethylene glycol (MIRALAX / GLYCOLAX) 17 g packet Take 17 g by mouth daily as needed for mild constipation or moderate constipation.   potassium chloride (KLOR-CON) 20 MEQ packet Take 40 mEq by mouth daily.   rosuvastatin (CRESTOR) 40 MG tablet Take 1 tablet (40 mg total) by mouth daily.   VENTOLIN HFA 108 (90 Base) MCG/ACT inhaler INHALE 2 PUFFS INTO THE LUNGS EVERY 6 HOURS AS NEEDED FOR WHEEZING OR SHORTNESS OF BREATH   [DISCONTINUED] amoxicillin-clavulanate (AUGMENTIN) 875-125 MG tablet Take 1 tablet by mouth 2 (two) times daily.   [DISCONTINUED] LORazepam (ATIVAN) 0.5 MG tablet Take 0.5 mg by mouth daily as needed for anxiety.   No facility-administered encounter medications  on file as of 08/11/2023.    Review of Systems  Immunization History  Administered Date(s) Administered   Fluad Quad(high Dose 65+) 08/06/2019, 08/25/2020, 08/23/2021, 08/28/2022   Hep A / Hep B 05/09/2016, 06/11/2016, 11/12/2016   Influenza Split 10/10/2013   Influenza Whole 10/10/2011   Influenza, High Dose Seasonal PF 08/19/2016, 09/05/2017   Influenza,inj,Quad PF,6+ Mos 08/01/2014, 08/04/2015   Influenza-Unspecified 09/05/2017, 09/17/2018, 09/21/2019   Moderna Covid-19 Vaccine Bivalent Booster 86yrs & up 08/31/2021   Moderna Sars-Covid-2 Vaccination 12/16/2019, 01/13/2020, 10/17/2020, 03/16/2021   Pneumococcal Conjugate-13 01/10/2014   Pneumococcal Polysaccharide-23 06/25/2012   Tdap 06/25/2012   Zoster Recombinant(Shingrix) 04/24/2017, 08/26/2017   Zoster, Live 01/10/2006   Pertinent  Health Maintenance Due  Topic Date Due   INFLUENZA VACCINE  07/03/2023   DEXA SCAN  Completed      08/28/2022    4:24 PM 10/03/2022    1:56 PM 02/03/2023   11:01 AM 05/08/2023   12:37 PM 06/26/2023    8:56 AM  Fall Risk  Falls in the past year? 1  1 1 1   Was there an injury with Fall? 1  1 1 1   Fall Risk Category Calculator 2  3 3 3   Fall Risk Category (Retired) Moderate      (RETIRED) Patient Fall Risk Level High fall risk High fall risk     Patient at Risk for Falls Due to History of fall(s);Impaired balance/gait  History of fall(s) History of fall(s) History of fall(s)  Fall risk Follow up Falls evaluation completed  Falls evaluation completed Falls evaluation completed Falls evaluation completed   Functional Status Survey:    Vitals:   08/11/23 0827  BP: 108/74  Pulse: 80  Resp: 17  Temp: (!) 97.2 F (36.2 C)  SpO2: 100%  Weight: 154 lb 6.4 oz (70 kg)  Height: 5\' 5"  (1.651  m)   Body mass index is 25.69 kg/m. Physical Exam Constitutional:      Comments: frail  Cardiovascular:     Rate and Rhythm: Normal rate.     Pulses: Normal pulses.  Pulmonary:     Effort: Pulmonary  effort is normal.     Breath sounds: Normal breath sounds.  Musculoskeletal:     Comments: Bilateral 1+ pitting edema, with compression stockings in place  Neurological:     Mental Status: She is alert and oriented to person, place, and time.     Labs reviewed: Recent Labs    04/29/23 1500 05/17/23 1201 05/19/23 0410 05/20/23 0330 05/26/23 0000 06/02/23 1201 06/26/23 0930  NA 141   < > 137 136 137 141 142  K 3.2*   < > 3.5 3.6 3.4* 3.9 3.6  CL 102   < > 103 104  --  102 101  CO2 28   < > 25 26  --  28 33*  GLUCOSE 99   < > 95 84  --  86 93  BUN 29*   < > 19 18 22* 29* 34*  CREATININE 1.51*   < > 0.96 0.87 1.1 1.26* 1.51*  CALCIUM 9.4   < > 8.7* 8.3*  --  10.1 10.5  MG 2.1  --  2.2  --   --   --  2.3   < > = values in this interval not displayed.   Recent Labs    04/23/23 0127 04/29/23 1501 05/17/23 1201 05/26/23 0000 07/01/23 1436  AST 50* 33 47* 26  --   ALT 27 24 26 15   --   ALKPHOS 111 109 123 129*  --   BILITOT 1.0 0.9 1.5*  --   --   PROT 6.5 6.1* 6.8  --   --   ALBUMIN 4.2 3.7 4.3  --  4.3   Recent Labs    04/17/23 0529 04/19/23 1331 04/23/23 0127 04/29/23 1500 05/19/23 0410 05/20/23 0330 05/26/23 0000 06/02/23 1201  WBC 3.4*   < > 4.3   < > 4.9 4.1  --  4.6  NEUTROABS 2.2  --  3.1  --   --   --   --  3.1  HGB 9.0*   < > 9.8*   < > 8.3* 7.7* 9.4* 8.9 Repeated and verified X2.*  HCT 28.6*   < > 31.2*   < > 27.0* 24.7*  --  27.3*  MCV 95.0   < > 95.1   < > 95.4 95.4  --  92.3  PLT 91*   < > 107*   < > 111* 109* 201 143.0*   < > = values in this interval not displayed.   Lab Results  Component Value Date   TSH 4.842 (H) 04/29/2023   Lab Results  Component Value Date   HGBA1C 5.5 02/11/2020   Lab Results  Component Value Date   CHOL 143 05/21/2019   HDL 59.50 05/21/2019   LDLCALC 71 05/21/2019   LDLDIRECT 113.0 02/15/2016   TRIG 60.0 05/21/2019   CHOLHDL 2 05/21/2019    Significant Diagnostic Results in last 30 days:  No results  found.  Assessment/Plan Chronic kidney disease (CKD) stage G3a/A2, moderately decreased glomerular filtration rate (GFR) between 45-59 mL/min/1.73 square meter and albuminuria creatinine ratio between 30-299 mg/g (HCC)  Moderate dementia with psychotic disturbance, unspecified dementia type (HCC)  Multiple falls  Hypotension, unspecified hypotension type  Hospice care patient  Chronic systolic  congestive heart failure [facility]) Patient with improved symptoms for CKD and CHF. Discussed her improvements with son and patient. At this time will trial PT/OT evaluation for safety as she will likely continue to need hospice for her underlying cardiac disease -- on midodrine which is maintaining blood pressures, most recent BNP 5,000. No falls since recent decompensation of CHF. Continue supportive care for dementia and hospice. Labs for current kidney function to assess for need of electrolyte repletion.   Family/ staff Communication: HCPOA, nursing, hospice nursing  Labs/tests ordered:  BMP, magnesium

## 2023-08-12 LAB — BASIC METABOLIC PANEL
BUN: 29 — AB (ref 4–21)
CO2: 32 — AB (ref 13–22)
Chloride: 100 (ref 99–108)
Creatinine: 1.7 — AB (ref 0.5–1.1)
Glucose: 78
Potassium: 4 mEq/L (ref 3.5–5.1)
Sodium: 139 (ref 137–147)

## 2023-08-12 LAB — COMPREHENSIVE METABOLIC PANEL
Calcium: 9.3 (ref 8.7–10.7)
eGFR: 30

## 2023-08-21 ENCOUNTER — Encounter (INDEPENDENT_AMBULATORY_CARE_PROVIDER_SITE_OTHER): Payer: Medicare Other | Admitting: Vascular Surgery

## 2023-08-22 ENCOUNTER — Non-Acute Institutional Stay: Admitting: Student

## 2023-08-22 ENCOUNTER — Encounter: Payer: Self-pay | Admitting: Student

## 2023-08-22 DIAGNOSIS — F03B2 Unspecified dementia, moderate, with psychotic disturbance: Secondary | ICD-10-CM

## 2023-08-22 DIAGNOSIS — I5043 Acute on chronic combined systolic (congestive) and diastolic (congestive) heart failure: Secondary | ICD-10-CM

## 2023-08-22 DIAGNOSIS — I959 Hypotension, unspecified: Secondary | ICD-10-CM | POA: Diagnosis not present

## 2023-08-22 NOTE — Progress Notes (Unsigned)
Location:  Other [place].  Nursing Home Room Number: [facility]. [address] Place of Service:  ALF (13) Provider:  Earnestine Mealing, MD  Patient Care Team: Earnestine Mealing, MD as PCP - General (Family Medicine) Marinus Maw, MD as PCP - Electrophysiology (Cardiology) Antonieta Iba, MD as PCP - Cardiology (Cardiology) Lemar Livings Merrily Pew, MD (General Surgery) Sherlene Shams, MD (Internal Medicine) Sherlene Shams, MD (Internal Medicine) Toney Reil, MD as Consulting Physician (Gastroenterology)  Extended Emergency Contact Information Primary Emergency Contact: Gerken,Brett Address: [address]          [place], [place] [address] [place] of [place] Phone: [phone] Relation: Son  Code Status:  DNR Goals of care: Advanced Directive information    08/22/2023    3:56 PM  Advanced Directives  Does Patient Have a Medical Advance Directive? Yes  Type of Estate agent of [place];Out of facility DNR (pink MOST or yellow form);Living will  Does patient want to make changes to medical advance directive? No - Patient declined  Copy of Healthcare Power of Attorney in Chart? Yes - validated most recent copy scanned in chart (See row information)     Chief Complaint  Patient presents with   Acute Visit    Referral Request    HPI:  Pt is a 82 y.o. female seen today for an acute visit for Referral Request  Patient is alert and introduces herself. She states she would like an evaluation with psychiatry for a second opinion.    Past Medical History:  Diagnosis Date   Acute on chronic diastolic (congestive) heart failure (HCC)    Allergy    See list   Altered mental status 04/23/2023   Anemia 2018   Anxiety    Occasionally take Xanax for sleep   Anxiety associated with depression    Prn alprazolam    Anxiety associated with depression    Arthritis    Hands, Back   Atrial fibrillation, persistent (HCC)    DCCV 08/22/2015    Bradycardia post-op bradycardia, pacer dependent   MDT PPM 11/06/15, Dr. Ladona Ridgel   Cataract    Left eye   Chronic kidney disease (CKD) stage G3a/A2, moderately decreased glomerular filtration rate (GFR) between 45-59 mL/min/1.73 square meter and albuminuria creatinine ratio between 30-299 mg/g (HCC) 11/13/2021   Closed bilateral fracture of pubic rami (HCC) 08/28/2022   Diverticulosis    Focal seizures (HCC)    Heart murmur    Hematuria, gross 04/09/2018   Hemorrhoid    Hepatic cyst    innumerable   History of asbestos exposure    Hyperlipidemia    IBS (irritable bowel syndrome)    Idiopathic thrombocytopenic purpura (ITP) (HCC)    Migraine    MVP (mitral valve prolapse)    Near syncope 01/26/2023   Nodule of right lung    Osteoporosis of forearm    RBBB    Restrictive lung disease    Mild on PFT & likely cardiac in etiology    Right sided sciatica 04/10/2022   S/P Minimally invasive maze operation for atrial fibrillation 10/31/2015   Complete bilateral atrial lesion set using cryothermy and bipolar radiofrequency ablation with clipping of LA appendage via right mini thoracotomy approach   S/P minimally invasive mitral valve replacement with bioprosthetic valve 10/31/2015   33 mm [facility] Mitral bovine bioprosthetic tissue valve placed via right mini thoracotomy approach   Seizures (HCC)    left foot paralysis and left hand paralysis Dr.  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   SVT (supraventricular tachycardia)    Thoracic aorta atherosclerosis (HCC)    TIA (transient ischemic attack)    Visit for preventive health examination 05/23/2016   Past Surgical History:  Procedure Laterality Date   BREAST EXCISIONAL BIOPSY Right Late 80s   Negative X2   CARDIAC CATHETERIZATION N/A 10/18/2015   Procedure: Right/Left Heart Cath and Coronary Angiography;  Surgeon: Tonny Bollman, MD;  Location: [facility] INVASIVE CV LAB;  Service:  Cardiovascular;  Laterality: N/A;   CARDIOVERSION N/A 08/22/2015   Procedure: CARDIOVERSION;  Surgeon: Vesta Mixer, MD;  Location: [facility] ENDOSCOPY;  Service: Cardiovascular;  Laterality: N/A;   COLONOSCOPY  2003   EP IMPLANTABLE DEVICE N/A 11/06/2015   Procedure: Pacemaker Implant;  Surgeon: Marinus Maw, MD;  Location: [facility] INVASIVE CV LAB;  Service: Cardiovascular;  Laterality: N/A;   EP IMPLANTABLE DEVICE N/A 02/20/2016   Procedure: Lead Extraction;  Surgeon: Marinus Maw, MD;  Location: [facility] INVASIVE CV LAB;  Service: Cardiovascular;  Laterality: N/A;   ESOPHAGOGASTRODUODENOSCOPY (EGD) WITH PROPOFOL N/A 05/19/2023   Procedure: ESOPHAGOGASTRODUODENOSCOPY (EGD) WITH PROPOFOL;  Surgeon: Midge Minium, MD;  Location: [facility] ENDOSCOPY;  Service: Endoscopy;  Laterality: N/A;   EYE SURGERY Right 2013   FEMUR IM NAIL Left 06/18/2021   Procedure: INTRAMEDULLARY (IM) NAIL FEMORAL, OPEN REDUCTION INTERNAL FIXATION FEMUR RIGHT LITTLE FINGER PIP CLOSED REDUCTION;  Surgeon: Myrene Galas, MD;  Location: [facility] OR;  Service: Orthopedics;  Laterality: Left;   FOOT SURGERY     ~2007 right foot bunion   MANDIBLE FRACTURE SURGERY  03/26/2013   MINIMALLY INVASIVE MAZE PROCEDURE N/A 10/31/2015   Procedure: MINIMALLY INVASIVE MAZE PROCEDURE;  Surgeon: Purcell Nails, MD;  Location: [facility] OR;  Service: Open Heart Surgery;  Laterality: N/A;   MITRAL VALVE REPLACEMENT Right 10/31/2015   Procedure: MINIMALLY INVASIVE MITRAL VALVE (MV) REPLACEMENT;  Surgeon: Purcell Nails, MD;  Location: [facility] OR;  Service: Open Heart Surgery;  Laterality: Right;   PACEMAKER LEAD REMOVAL  02/20/2016   TEE WITH CARDIOVERSION     TEE WITHOUT CARDIOVERSION N/A 08/22/2015   Procedure: TRANSESOPHAGEAL ECHOCARDIOGRAM (TEE);  Surgeon: Vesta Mixer, MD;  Location: [facility] ENDOSCOPY;  Service: Cardiovascular;  Laterality: N/A;   TEE WITHOUT CARDIOVERSION N/A 10/31/2015   Procedure: TRANSESOPHAGEAL ECHOCARDIOGRAM (TEE);  Surgeon: Purcell Nails, MD;   Location: [facility] OR;  Service: Open Heart Surgery;  Laterality: N/A;   TUBAL LIGATION     VARICOSE VEIN SURGERY Right     Allergies  Allergen Reactions   Codeine Nausea And Vomiting and Other (See Comments)    migraine   Epinephrine Palpitations and Shortness Of Breath   Hydrocodone Nausea And Vomiting and Other (See Comments)    MIGRAINE   Hydromorphone Nausea And Vomiting and Other (See Comments)    migraine   Molds & Smuts Anxiety, Other (See Comments) and Shortness Of Breath   Oxycodone Nausea And Vomiting and Other (See Comments)    Severe migraine   Meloxicam Other (See Comments)    Severe reflux   Codeine Other (See Comments)    Migraine   Dextromethorphan Other (See Comments)    seizures   Diphen [Diphenhydramine Hcl] Other (See Comments)    seizure   Diphenhydramine Other (See Comments)    seizure   Diphenhydramine Hcl     Other reaction(s): Other (See Comments)   Diphenylpyraline Other (See Comments)  Doxycycline Itching, Swelling and Other (See Comments)    Facial   Doxycycline Itching and Swelling   Hydrocodone Other (See Comments)    Migraine   Hydromorphone Other (See Comments)    Migraine   Meloxicam Other (See Comments)   Mobic [Meloxicam] Other (See Comments)    reflux   Nsaids    Nsaids Other (See Comments)    Pt on blood thinner   Oxycodone Other (See Comments)    Migraine   Propofol Other (See Comments)   Propofol Other (See Comments)    Very sensitive; patient stated she was told she was told she had apnea    Outpatient Encounter Medications as of 08/22/2023  Medication Sig   acetaminophen (TYLENOL) 325 MG tablet Take 650 mg by mouth every 4 (four) hours as needed for mild pain or moderate pain.   albuterol (PROVENTIL) (2.5 MG/3ML) 0.083% nebulizer solution Take 3 mLs (2.5 mg total) by nebulization every 6 (six) hours as needed for wheezing or shortness of breath.   ARIPiprazole (ABILIFY) 10 MG tablet Take 10 mg by mouth every evening.   bismuth  subsalicylate (PEPTO BISMOL) 262 MG/15ML suspension Take 30 mLs by mouth every 6 (six) hours as needed.   fluticasone (FLONASE) 50 MCG/ACT nasal spray Place 1 spray into both nostrils daily.   furosemide (LASIX) 40 MG tablet Take 40 mg by mouth daily.   gabapentin (NEURONTIN) 100 MG capsule Take 200 mg by mouth 3 (three) times daily.   Iron, Ferrous Sulfate, 325 (65 Fe) MG TABS Take 1 tablet by mouth daily.   lamoTRIgine (LAMICTAL) 150 MG tablet Take 150 mg by mouth daily.   lamoTRIgine (LAMICTAL) 200 MG tablet Take 200 mg by mouth at bedtime.   metoprolol tartrate (LOPRESSOR) 25 MG tablet Take 25 mg by mouth daily.   midodrine (PROAMATINE) 2.5 MG tablet Take 2.5 mg by mouth 3 (three) times daily with meals. Give one tablet by mouth daily as needed for low BP   montelukast (SINGULAIR) 10 MG tablet Take 1 tablet (10 mg total) by mouth at bedtime.   OLANZapine (ZYPREXA) 5 MG tablet Take 5 mg by mouth at bedtime.   OLANZapine (ZYPREXA) 5 MG tablet Take 5 mg by mouth as needed (delusions and agitated restlessness).   ondansetron (ZOFRAN) 8 MG tablet Take 8 mg by mouth every 8 (eight) hours as needed for nausea or vomiting.   pantoprazole (PROTONIX) 40 MG tablet Take 1 tablet (40 mg total) by mouth 2 (two) times daily before a meal.   polyethylene glycol (MIRALAX / GLYCOLAX) 17 g packet Take 17 g by mouth daily as needed for mild constipation or moderate constipation.   potassium chloride (KLOR-CON) 20 MEQ packet Take 20 mEq by mouth daily.   rosuvastatin (CRESTOR) 40 MG tablet Take 1 tablet (40 mg total) by mouth daily.   VENTOLIN HFA 108 (90 Base) MCG/ACT inhaler INHALE 2 PUFFS INTO THE LUNGS EVERY 6 HOURS AS NEEDED FOR WHEEZING OR SHORTNESS OF BREATH   [DISCONTINUED] furosemide (LASIX) 40 MG tablet Take 1 tablet (40 mg total) by mouth every other day. (Patient taking differently: Take 40 mg by mouth daily.)   No facility-administered encounter medications on file as of 08/22/2023.    Review of  Systems  Immunization History  Administered Date(s) Administered   Fluad Quad(high Dose 65+) 08/06/2019, 08/25/2020, 08/23/2021, 08/28/2022   Hep A / Hep B 05/09/2016, 06/11/2016, 11/12/2016   Influenza Split 10/10/2013   Influenza Whole 10/10/2011   Influenza, High Dose  Seasonal PF 08/19/2016, 09/05/2017   Influenza,inj,Quad PF,6+ Mos 08/01/2014, 08/04/2015   Influenza-Unspecified 09/05/2017, 09/17/2018, 09/21/2019   Moderna Covid-19 Vaccine Bivalent Booster 22yrs & up 08/31/2021   Moderna Sars-Covid-2 Vaccination 12/16/2019, 01/13/2020, 10/17/2020, 03/16/2021   Pneumococcal Conjugate-13 01/10/2014   Pneumococcal Polysaccharide-23 06/25/2012   Tdap 06/25/2012   Zoster Recombinant(Shingrix) 04/24/2017, 08/26/2017   Zoster, Live 01/10/2006   Pertinent  Health Maintenance Due  Topic Date Due   INFLUENZA VACCINE  07/03/2023   DEXA SCAN  Completed      08/28/2022    4:24 PM 10/03/2022    1:56 PM 02/03/2023   11:01 AM 05/08/2023   12:37 PM 06/26/2023    8:56 AM  Fall Risk  Falls in the past year? 1  1 1 1   Was there an injury with Fall? 1  1 1 1   Fall Risk Category Calculator 2  3 3 3   Fall Risk Category (Retired) Moderate      (RETIRED) Patient Fall Risk Level High fall risk High fall risk     Patient at Risk for Falls Due to History of fall(s);Impaired balance/gait  History of fall(s) History of fall(s) History of fall(s)  Fall risk Follow up Falls evaluation completed  Falls evaluation completed Falls evaluation completed Falls evaluation completed   Functional Status Survey:    Vitals:   08/22/23 1550  BP: 102/62  Pulse: 72  Resp: 16  Temp: 98 F (36.7 C)  SpO2: 93%  Weight: 154 lb 6.4 oz (70 kg)  Height: 5\' 5"  (1.651 m)   Body mass index is 25.69 kg/m. Physical Exam Constitutional:      Comments: Frail sitting in wheelchair  Neurological:     Mental Status: She is alert.     Comments: Oriented to self an place     Labs reviewed: Recent Labs     04/29/23 1500 05/17/23 1201 05/19/23 0410 05/20/23 0330 05/26/23 0000 06/02/23 1201 06/26/23 0930 08/12/23 0000  NA 141   < > 137 136   < > 141 142 139  K 3.2*   < > 3.5 3.6   < > 3.9 3.6 4.0  CL 102   < > 103 104  --  102 101 100  CO2 28   < > 25 26  --  28 33* 32*  GLUCOSE 99   < > 95 84  --  86 93  --   BUN 29*   < > 19 18   < > 29* 34* 29*  CREATININE 1.51*   < > 0.96 0.87   < > 1.26* 1.51* 1.7*  CALCIUM 9.4   < > 8.7* 8.3*  --  10.1 10.5 9.3  MG 2.1  --  2.2  --   --   --  2.3  --    < > = values in this interval not displayed.   Recent Labs    04/23/23 0127 04/29/23 1501 05/17/23 1201 05/26/23 0000 07/01/23 1436  AST 50* 33 47* 26  --   ALT 27 24 26 15   --   ALKPHOS 111 109 123 129*  --   BILITOT 1.0 0.9 1.5*  --   --   PROT 6.5 6.1* 6.8  --   --   ALBUMIN 4.2 3.7 4.3  --  4.3   Recent Labs    04/17/23 0529 04/19/23 1331 04/23/23 0127 04/29/23 1500 05/19/23 0410 05/20/23 0330 05/26/23 0000 06/02/23 1201  WBC 3.4*   < > 4.3   < >  4.9 4.1  --  4.6  NEUTROABS 2.2  --  3.1  --   --   --   --  3.1  HGB 9.0*   < > 9.8*   < > 8.3* 7.7* 9.4* 8.9 Repeated and verified X2.*  HCT 28.6*   < > 31.2*   < > 27.0* 24.7*  --  27.3*  MCV 95.0   < > 95.1   < > 95.4 95.4  --  92.3  PLT 91*   < > 107*   < > 111* 109* 201 143.0*   < > = values in this interval not displayed.   Lab Results  Component Value Date   TSH 4.842 (H) 04/29/2023   Lab Results  Component Value Date   HGBA1C 5.5 02/11/2020   Lab Results  Component Value Date   CHOL 143 05/21/2019   HDL 59.50 05/21/2019   LDLCALC 71 05/21/2019   LDLDIRECT 113.0 02/15/2016   TRIG 60.0 05/21/2019   CHOLHDL 2 05/21/2019    Significant Diagnostic Results in last 30 days:  No results found.  Assessment/Plan Acute on chronic combined systolic and diastolic CHF (congestive heart failure) (HCC)  Hypotension, unspecified hypotension type  Moderate dementia with psychotic disturbance, unspecified dementia type  (HCC) Patient with hx of chf significant debility and has been in higher level of care for 4 months. Will request psychiatric evaluation as patient is more oriented at this time. Evaluation previously scheduled Oct. 2.  Unclear of how patient will progress with physical therapy at this time given tenuous course. Will increase midodrine to 5mg  given continued low BP.   Family/ staff Communication: nursing  Labs/tests ordered:  none

## 2023-09-22 ENCOUNTER — Non-Acute Institutional Stay: Admitting: Student

## 2023-09-22 DIAGNOSIS — F03B2 Unspecified dementia, moderate, with psychotic disturbance: Secondary | ICD-10-CM

## 2023-09-22 DIAGNOSIS — I959 Hypotension, unspecified: Secondary | ICD-10-CM

## 2023-09-22 DIAGNOSIS — I5043 Acute on chronic combined systolic (congestive) and diastolic (congestive) heart failure: Secondary | ICD-10-CM | POA: Diagnosis not present

## 2023-09-22 DIAGNOSIS — R296 Repeated falls: Secondary | ICD-10-CM

## 2023-09-22 DIAGNOSIS — N1831 Chronic kidney disease, stage 3a: Secondary | ICD-10-CM

## 2023-09-22 DIAGNOSIS — E46 Unspecified protein-calorie malnutrition: Secondary | ICD-10-CM
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
## 2023-09-23 ENCOUNTER — Encounter: Payer: Self-pay | Admitting: Student

## 2023-09-25 ENCOUNTER — Encounter: Payer: Self-pay | Admitting: Oncology

## 2023-09-26 ENCOUNTER — Encounter: Payer: Self-pay | Admitting: Oncology

## 2023-09-26 ENCOUNTER — Ambulatory Visit (INDEPENDENT_AMBULATORY_CARE_PROVIDER_SITE_OTHER)

## 2023-09-26 ENCOUNTER — Encounter: Payer: Self-pay | Admitting: Student

## 2023-09-26 ENCOUNTER — Ambulatory Visit: Payer: Medicare Other | Admitting: Internal Medicine

## 2023-09-26 DIAGNOSIS — I495 Sick sinus syndrome: Secondary | ICD-10-CM

## 2023-09-29 LAB — CUP PACEART REMOTE DEVICE CHECK
Battery Impedance: 1565 Ohm
Battery Remaining Longevity: 36 mo
Battery Voltage: 2.77 V
Brady Statistic AP VP Percent: 71 %
Brady Statistic AP VS Percent: 0 %
Brady Statistic AS VP Percent: 29 %
Brady Statistic AS VS Percent: 0 %
Date Time Interrogation Session: 20241025100105
Implantable Lead Connection Status: 753985
Implantable Lead Connection Status: 753985
Implantable Lead Implant Date: 20161205
Implantable Lead Implant Date: 20170321
Implantable Lead Location: 753859
Implantable Lead Location: 753860
Implantable Lead Model: 5076
Implantable Lead Model: 5076
Implantable Pulse Generator Implant Date: 20161205
Lead Channel Impedance Value: 412 Ohm
Lead Channel Impedance Value: 567 Ohm
Lead Channel Pacing Threshold Amplitude: 1 V
Lead Channel Pacing Threshold Amplitude: 1.25 V
Lead Channel Pacing Threshold Pulse Width: 0.4 ms
Lead Channel Pacing Threshold Pulse Width: 0.4 ms
Lead Channel Setting Pacing Amplitude: 2.5 V
Lead Channel Setting Pacing Amplitude: 2.5 V
Lead Channel Setting Pacing Pulse Width: 0.4 ms
Lead Channel Setting Sensing Sensitivity: 4 mV
Zone Setting Status: 755011
Zone Setting Status: 755011

## 2023-10-01 ENCOUNTER — Ambulatory Visit: Attending: Medical | Admitting: Medical

## 2023-10-01 ENCOUNTER — Encounter: Payer: Self-pay | Admitting: Oncology

## 2023-10-01 ENCOUNTER — Encounter: Payer: Self-pay | Admitting: Medical

## 2023-10-01 VITALS — BP 110/78 | HR 70 | Ht 65.0 in | Wt 144.2 lb

## 2023-10-01 DIAGNOSIS — I471 Supraventricular tachycardia, unspecified: Secondary | ICD-10-CM

## 2023-10-01 DIAGNOSIS — I428 Other cardiomyopathies: Secondary | ICD-10-CM | POA: Diagnosis not present

## 2023-10-01 DIAGNOSIS — I495 Sick sinus syndrome: Secondary | ICD-10-CM | POA: Diagnosis present

## 2023-10-01 DIAGNOSIS — E782 Mixed hyperlipidemia: Secondary | ICD-10-CM

## 2023-10-01 DIAGNOSIS — R600 Localized edema: Secondary | ICD-10-CM | POA: Diagnosis not present

## 2023-10-01 DIAGNOSIS — I480 Paroxysmal atrial fibrillation: Secondary | ICD-10-CM | POA: Diagnosis not present

## 2023-10-01 DIAGNOSIS — I5022 Chronic systolic (congestive) heart failure: Secondary | ICD-10-CM | POA: Diagnosis not present

## 2023-10-01 NOTE — Patient Instructions (Signed)
Medication Instructions:  Your physician recommends that you continue on your current medications as directed. Please refer to the Current Medication list given to you today.  *If you need a refill on your cardiac medications before your next appointment, please call your pharmacy*  Lab Work: Your provider would like for you to have following labs drawn today CMET.   If you have labs (blood work) drawn today and your tests are completely normal, you will receive your results only by: MyChart Message (if you have MyChart) OR A paper copy in the mail If you have any lab test that is abnormal or we need to change your treatment, we will call you to review the results.  Testing/Procedures: - None ordered  Follow-Up: At [facility], you and your health needs are our priority.  As part of our continuing mission to provide you with exceptional heart care, we have created designated Provider Care Teams.  These Care Teams include your primary Cardiologist (physician) and Advanced Practice Providers (APPs -  Physician Assistants and Nurse Practitioners) who all work together to provide you with the care you need, when you need it.  Your next appointment:   4 month(s)  Provider:   Julien Nordmann, MD    Other Instructions - None

## 2023-10-01 NOTE — Progress Notes (Unsigned)
Cardiology Office Note:    Date:  10/01/2023   ID:  CHANTELLA AVARA, DOB 1941/01/11, MRN [id number]  PCP:  Earnestine Mealing, MD  [facility] Cardiologist:  Julien Nordmann, MD  [facility] Electrophysiologist:  Lewayne Bunting, MD   Referring MD: Sherlene Shams, MD   Chief Complaint: 3 month follow-up  History of Present Illness:    [name] [name] is a 82 y.o. 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   Previous heart cath in 11/206 showed no CAD. This was performed as part of MVR work-up and maze procedure in 2016.   The patient was last seen in July 2024 and reported frequent mechanical goal falls, she had been moved to a memory care facility.  Patient was overall stable from cardiac perspective.  Based off labs on 725 Lasix was changed to every other day by PCP.  Today, the patient is overall doing well. She is at a memory care facility and is accompanied by caretaker. She hasn't fallen recently. She is using a walker most days. She denies chest pain. She has SOB, lightheadedness and dizziness when she bends over. She wears compression socks daily. She takes lasix 40mg  daily. She is prescribed midodrine 5mg TID.  Recent labs showed Scr 1.7, BUN 29.   Past Medical History:  Diagnosis Date   Acute on chronic diastolic (congestive) heart failure (HCC)    Allergy    See list   Altered mental status 04/23/2023   Anemia 2018   Anxiety    Occasionally take Xanax for sleep   Anxiety associated with depression    Prn alprazolam    Anxiety associated with depression    Arthritis    Hands, Back    Atrial fibrillation, persistent (HCC)    DCCV 08/22/2015   Bradycardia post-op bradycardia, pacer dependent   MDT PPM 11/06/15, Dr. Ladona Ridgel   Cataract    Left eye   Chronic kidney disease (CKD) stage G3a/A2, moderately decreased glomerular filtration rate (GFR) between 45-59 mL/min/1.73 square meter and albuminuria creatinine ratio between 30-299 mg/g (HCC) 11/13/2021   Closed bilateral fracture of pubic rami (HCC) 08/28/2022   Diverticulosis    Focal seizures (HCC)    Heart murmur    Hematuria, gross 04/09/2018   Hemorrhoid    Hepatic cyst    innumerable   History of asbestos exposure    Hyperlipidemia    IBS (irritable bowel syndrome)    Idiopathic thrombocytopenic purpura (ITP) (HCC)    Migraine    MVP (mitral valve prolapse)    Near syncope 01/26/2023   Nodule of right lung    Osteoporosis of forearm    RBBB    Restrictive lung disease    Mild on PFT & likely cardiac in etiology    Right sided sciatica 04/10/2022   S/P Minimally invasive maze operation for atrial fibrillation 10/31/2015   Complete bilateral atrial lesion set using cryothermy and bipolar radiofrequency ablation with clipping of LA appendage via right mini thoracotomy approach   S/P minimally invasive mitral valve replacement with bioprosthetic valve 10/31/2015   33 mm [facility] Mitral bovine bioprosthetic tissue valve placed via right mini thoracotomy approach   Seizures (  HCC)    left foot paralysis and left hand paralysis Dr. 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   SVT (supraventricular tachycardia) (HCC)    Thoracic aorta atherosclerosis (HCC)    TIA (transient ischemic attack)    Visit for preventive health examination 05/23/2016    Past Surgical History:  Procedure Laterality Date   BREAST EXCISIONAL BIOPSY Right Late 80s   Negative X2   CARDIAC CATHETERIZATION N/A 10/18/2015   Procedure: Right/Left Heart Cath and Coronary Angiography;  Surgeon:  Tonny Bollman, MD;  Location: [facility] INVASIVE CV LAB;  Service: Cardiovascular;  Laterality: N/A;   CARDIOVERSION N/A 08/22/2015   Procedure: CARDIOVERSION;  Surgeon: Vesta Mixer, MD;  Location: [facility] ENDOSCOPY;  Service: Cardiovascular;  Laterality: N/A;   COLONOSCOPY  2003   EP IMPLANTABLE DEVICE N/A 11/06/2015   Procedure: Pacemaker Implant;  Surgeon: Marinus Maw, MD;  Location: [facility] INVASIVE CV LAB;  Service: Cardiovascular;  Laterality: N/A;   EP IMPLANTABLE DEVICE N/A 02/20/2016   Procedure: Lead Extraction;  Surgeon: Marinus Maw, MD;  Location: [facility] INVASIVE CV LAB;  Service: Cardiovascular;  Laterality: N/A;   ESOPHAGOGASTRODUODENOSCOPY (EGD) WITH PROPOFOL N/A 05/19/2023   Procedure: ESOPHAGOGASTRODUODENOSCOPY (EGD) WITH PROPOFOL;  Surgeon: Midge Minium, MD;  Location: [facility] ENDOSCOPY;  Service: Endoscopy;  Laterality: N/A;   EYE SURGERY Right 2013   FEMUR IM NAIL Left 06/18/2021   Procedure: INTRAMEDULLARY (IM) NAIL FEMORAL, OPEN REDUCTION INTERNAL FIXATION FEMUR RIGHT LITTLE FINGER PIP CLOSED REDUCTION;  Surgeon: Myrene Galas, MD;  Location: [facility] OR;  Service: Orthopedics;  Laterality: Left;   FOOT SURGERY     ~2007 right foot bunion   MANDIBLE FRACTURE SURGERY  03/26/2013   MINIMALLY INVASIVE MAZE PROCEDURE N/A 10/31/2015   Procedure: MINIMALLY INVASIVE MAZE PROCEDURE;  Surgeon: Purcell Nails, MD;  Location: [facility] OR;  Service: Open Heart Surgery;  Laterality: N/A;   MITRAL VALVE REPLACEMENT Right 10/31/2015   Procedure: MINIMALLY INVASIVE MITRAL VALVE (MV) REPLACEMENT;  Surgeon: Purcell Nails, MD;  Location: [facility] OR;  Service: Open Heart Surgery;  Laterality: Right;   PACEMAKER LEAD REMOVAL  02/20/2016   TEE WITH CARDIOVERSION     TEE WITHOUT CARDIOVERSION N/A 08/22/2015   Procedure: TRANSESOPHAGEAL ECHOCARDIOGRAM (TEE);  Surgeon: Vesta Mixer, MD;  Location: [facility] ENDOSCOPY;  Service: Cardiovascular;  Laterality: N/A;   TEE WITHOUT CARDIOVERSION N/A 10/31/2015   Procedure:  TRANSESOPHAGEAL ECHOCARDIOGRAM (TEE);  Surgeon: Purcell Nails, MD;  Location: [facility] OR;  Service: Open Heart Surgery;  Laterality: N/A;   TUBAL LIGATION     VARICOSE VEIN SURGERY Right     Current Medications: Current Meds  Medication Sig   acetaminophen (TYLENOL) 325 MG tablet Take 650 mg by mouth every 4 (four) hours as needed for mild pain or moderate pain.   albuterol (PROVENTIL) (2.5 MG/3ML) 0.083% nebulizer solution Take 3 mLs (2.5 mg total) by nebulization every 6 (six) hours as needed for wheezing or shortness of breath.   bismuth subsalicylate (PEPTO BISMOL) 262 MG/15ML suspension Take 30 mLs by mouth every 6 (six) hours as needed.   furosemide (LASIX) 40 MG tablet Take 40 mg by mouth daily.   lamoTRIgine (LAMICTAL) 150 MG tablet Take 150 mg by mouth daily.   lamoTRIgine (LAMICTAL) 200 MG tablet Take 200 mg by mouth at bedtime.   metoprolol tartrate (LOPRESSOR) 25 MG tablet Take 25 mg by mouth daily.   midodrine (PROAMATINE) 2.5 MG tablet Take  2.5 mg by mouth 3 (three) times daily with meals. Give one tablet by mouth daily as needed for low BP   midodrine (PROAMATINE) 5 MG tablet Take 5 mg by mouth 3 (three) times daily.   montelukast (SINGULAIR) 10 MG tablet Take 1 tablet (10 mg total) by mouth at bedtime.   OLANZapine (ZYPREXA) 5 MG tablet Take 5 mg by mouth at bedtime.   OLANZapine (ZYPREXA) 5 MG tablet Take 5 mg by mouth as needed (delusions and agitated restlessness).   ondansetron (ZOFRAN) 8 MG tablet Take 8 mg by mouth every 8 (eight) hours as needed for nausea or vomiting.   pantoprazole (PROTONIX) 40 MG tablet Take 1 tablet (40 mg total) by mouth 2 (two) times daily before a meal.   polyethylene glycol (MIRALAX / GLYCOLAX) 17 g packet Take 17 g by mouth daily as needed for mild constipation or moderate constipation.   potassium chloride (KLOR-CON) 20 MEQ packet Take 20 mEq by mouth daily.   rosuvastatin (CRESTOR) 40 MG tablet Take 1 tablet (40 mg total) by mouth daily.    VENTOLIN HFA 108 (90 Base) MCG/ACT inhaler INHALE 2 PUFFS INTO THE LUNGS EVERY 6 HOURS AS NEEDED FOR WHEEZING OR SHORTNESS OF BREATH     Allergies:   Codeine, Epinephrine, Hydrocodone, Hydromorphone, Molds & smuts, Oxycodone, Meloxicam, Codeine, Dextromethorphan, Diphen [diphenhydramine hcl], Diphenhydramine, Diphenhydramine hcl, Diphenylpyraline, Doxycycline, Doxycycline, Hydrocodone, Hydromorphone, Meloxicam, Mobic [meloxicam], Nsaids, Nsaids, Oxycodone, Propofol, and Propofol   Social History   Socioeconomic History   Marital status: Widowed    Spouse name: Deceased   Number of children: 2   Years of education: 3   Highest education level: Master's degree (e.g., MA, MS, MEng, MEd, MSW, MBA)  Occupational History   Occupation: Retired  Tobacco Use   Smoking status: Never   Smokeless tobacco: Never  Vaping Use   Vaping status: Never Used  Substance and Sexual Activity   Alcohol use: No    Alcohol/week: 3.0 standard drinks of alcohol    Types: 3 Standard drinks or equivalent per week   Drug use: No   Sexual activity: Not Currently  Other Topics Concern   Not on file  Social History Narrative   ** Merged History Encounter **       She is a widow.  Husband died from metastatic renal cell cancer. Originally from [place]. Previously lived in [place] from 1975-2007. Moved to [place] in 2007. No mold exposure recently but did have it through a prior work exposure in 1993. Has a masters in    public health. No bird exposure.  [facility] (09/29/17): She has moved into a retirement community since last appointment. She reports she had testing at her new residence that was positive for mold. It has since been treated.    Social Determinants of Health   Financial Resource Strain: Low Risk  (03/17/2023)   Overall Financial Resource Strain (CARDIA)    Difficulty of Paying Living Expenses: Not hard at all  Food Insecurity: No Food Insecurity (05/17/2023)   Hunger Vital Sign    Worried  About Running Out of Food in the Last Year: Never true    Ran Out of Food in the Last Year: Never true  Transportation Needs: No Transportation Needs (05/17/2023)   PRAPARE - Administrator, Civil Service (Medical): No    Lack of Transportation (Non-Medical): No  Physical Activity: Unknown (03/17/2023)   Exercise Vital Sign    Days of Exercise per Week: Patient declined  Minutes of Exercise per Session: Not on file  Stress: No Stress Concern Present (03/17/2023)   [employer] of Occupational Health - Occupational Stress Questionnaire    Feeling of Stress : Only a little  Social Connections: Unknown (03/17/2023)   Social Connection and Isolation Panel [NHANES]    Frequency of Communication with Friends and Family: Patient declined    Frequency of Social Gatherings with Friends and Family: Once a week    Attends Religious Services: Patient declined    Database administrator or Organizations: Yes    Attends Banker Meetings: Patient declined    Marital Status: Widowed     Family History: The patient's family history includes Arrhythmia in her brother and mother; Atrial fibrillation in her son; Celiac disease in her son; Heart failure in her mother; Hypertension in her mother; Liver cancer in her maternal grandmother; Prostate cancer in her brother; Stroke in her father; Stroke (age of onset: 52) in her brother; Stroke (age of onset: 76) in her maternal aunt. There is no history of Heart attack or Breast cancer.  ROS:   Please see the history of present illness.     All other systems reviewed and are negative.  EKGs/Labs/Other Studies Reviewed:    The following studies were reviewed today: Echo 12/2022   1. Left ventricular ejection fraction, by estimation, is 35 to 40%. The  left ventricle has moderately decreased function. The left ventricle  demonstrates global hypokinesis. There is mild left ventricular  hypertrophy. Left ventricular diastolic   parameters are indeterminate.   2. Right ventricular systolic function is mildly reduced. The right  ventricular size is severely enlarged.   3. Left atrial size was mild to moderately dilated.   4. Right atrial size was severely dilated.   5. The mitral valve has been repaired/replaced. No evidence of mitral  valve regurgitation. There is a 33 mm Edwards bioprosthetic valve present  in the mitral position. Procedure Date: 10/2015.   6. Tricuspid valve regurgitation is mild to moderate.   7. The aortic valve is tricuspid. Aortic valve regurgitation is not  visualized.   8. The inferior vena cava is dilated in size with <50% respiratory  variability, suggesting right atrial pressure of 15 mmHg.   Echo 11/2020 1. Left ventricular ejection fraction, by estimation, is 35 to 40%. The  left ventricle has moderately decreased function. The left ventricle  demonstrates global hypokinesis. Left ventricular diastolic parameters are  indeterminate. The average left  ventricular global longitudinal strain is -6.7 %.   2. Right ventricular systolic function is normal. The right ventricular  size is normal. There is normal pulmonary artery systolic pressure. The  estimated right ventricular systolic pressure is 35.5 mmHg.   3. Left atrial size was mildly dilated.   4. The mitral valve has been repaired/replaced. No evidence of mitral  valve regurgitation. No evidence of mitral stenosis. The mean mitral valve  gradient is 2.0 mmHg. There is a bioprosthetic valve present in the mitral  position. Procedure Date:  10/2015.   5. Tricuspid valve regurgitation is moderate.   EKG:  EKG is ordered today.  The ekg ordered today demonstrates V paced rhythm, 70bpm, no changes  Recent Labs: 04/29/2023: TSH 4.842 05/26/2023: ALT 15 06/02/2023: Hemoglobin 8.9 Repeated and verified X2.; Platelets 143.0 06/26/2023: Magnesium 2.3 07/01/2023: BNP 587.4 08/12/2023: BUN 29; Creatinine 1.7; Potassium 4.0; Sodium 139   Recent Lipid Panel    Component Value Date/Time   CHOL 143 05/21/2019 0816  CHOL 162 12/05/2014 0848   TRIG 60.0 05/21/2019 0816   HDL 59.50 05/21/2019 0816   HDL 61 12/05/2014 0848   CHOLHDL 2 05/21/2019 0816   VLDL 12.0 05/21/2019 0816   LDLCALC 71 05/21/2019 0816   LDLCALC 85 12/05/2014 0848   LDLDIRECT 113.0 02/15/2016 0930     Physical Exam:    VS:  BP 110/78 (BP Location: Left Arm, Patient Position: Sitting, Cuff Size: Normal)   Pulse 70   Ht 5\' 5"  (1.651 m)   Wt 144 lb 3.2 oz (65.4 kg)   SpO2 93%   BMI 24.00 kg/m     Wt Readings from Last 3 Encounters:  10/01/23 144 lb 3.2 oz (65.4 kg)  09/22/23 129 lb 6.4 oz (58.7 kg)  08/22/23 154 lb 6.4 oz (70 kg)     GEN:  Well nourished, well developed in no acute distress HEENT: Normal NECK: No JVD; No carotid bruits LYMPHATICS: No lymphadenopathy CARDIAC: RRR, no murmurs, rubs, gallops RESPIRATORY:  Clear to auscultation without rales, wheezing or rhonchi  ABDOMEN: Soft, non-tender, non-distended MUSCULOSKELETAL:  No edema; No deformity  SKIN: Warm and dry NEUROLOGIC:  Alert and oriented x 3 PSYCHIATRIC:  Normal affect   ASSESSMENT:    1. PAF (paroxysmal atrial fibrillation) (HCC)   2. Lower leg edema   3. NICM (nonischemic cardiomyopathy) (HCC)   4. Chronic systolic heart failure (HCC)   5. Hyperlipidemia, mixed   6. SVT (supraventricular tachycardia) (HCC)   7. Sinus node dysfunction (HCC)    PLAN:    In order of problems listed above:  Paroxysmal Afib S/p Maze and LAA clip. Continue Eliquis 5mg  BID. CHADSVASC of 6. Continue Toprol for rate control.   Chronic LLE NICM HFrEF Echo in 12/2022 showed LVEF 35-40%. Patient has chronic lower leg edema that is unchanged. She did not tolerate Entresto in the past. The patient is taking lasix 40mg  daily, midodrine 5mg  TID, Lopressor, spironolactone. Kidney function continues to climb. I will re-check CMET today, she may need referral to nephrology.   HLD LDL  71 in 2020. Continue Crestor 40mg  daily.   SVT s/p ablation Continue BB therapy.   SSS s/p PPM This is followed by EP.   Disposition: Follow up in 4 month(s) with MD     Signed, Shannen Flansburg David Stall, PA-C  10/01/2023 2:37 PM    [facility]

## 2023-10-02 LAB — COMPREHENSIVE METABOLIC PANEL
ALT: 16 [IU]/L (ref 0–32)
AST: 33 [IU]/L (ref 0–40)
Albumin: 4.2 g/dL (ref 3.7–4.7)
Alkaline Phosphatase: 178 [IU]/L — ABNORMAL HIGH (ref 44–121)
BUN/Creatinine Ratio: 18 (ref 12–28)
BUN: 27 mg/dL (ref 8–27)
Bilirubin Total: 0.6 mg/dL (ref 0.0–1.2)
CO2: 26 mmol/L (ref 20–29)
Calcium: 9.6 mg/dL (ref 8.7–10.3)
Chloride: 103 mmol/L (ref 96–106)
Creatinine, Ser: 1.49 mg/dL — ABNORMAL HIGH (ref 0.57–1.00)
Globulin, Total: 1.7 g/dL (ref 1.5–4.5)
Glucose: 139 mg/dL — ABNORMAL HIGH (ref 70–99)
Potassium: 3.7 mmol/L (ref 3.5–5.2)
Sodium: 144 mmol/L (ref 134–144)
Total Protein: 5.9 g/dL — ABNORMAL LOW (ref 6.0–8.5)
eGFR: 35 mL/min/{1.73_m2} — ABNORMAL LOW (ref >59)

## 2023-10-03 ENCOUNTER — Telehealth: Payer: Self-pay | Admitting: Cardiovascular Disease

## 2023-10-03 NOTE — Telephone Encounter (Signed)
Pt called in wanting contacts Eulogio Bear and Nelva Bush deleted from her acct due to Legal issues. As per pt req [name] was deleted but was not able to delete son. Pt needs to come in and sign a new DPR to have son removed. Pt's new contact # is [phone] which has been updated in chart.

## 2023-10-06 ENCOUNTER — Ambulatory Visit: Payer: Medicare Other | Admitting: Oncology

## 2023-10-06 ENCOUNTER — Other Ambulatory Visit: Payer: Medicare Other

## 2023-10-09 ENCOUNTER — Non-Acute Institutional Stay (SKILLED_NURSING_FACILITY): Payer: Medicare Other | Admitting: Nurse Practitioner

## 2023-10-09 ENCOUNTER — Encounter: Payer: Self-pay | Admitting: Nurse Practitioner

## 2023-10-09 DIAGNOSIS — E46 Unspecified protein-calorie malnutrition: Secondary | ICD-10-CM

## 2023-10-09 DIAGNOSIS — N1831 Chronic kidney disease, stage 3a: Secondary | ICD-10-CM

## 2023-10-09 DIAGNOSIS — F03B2 Unspecified dementia, moderate, with psychotic disturbance: Secondary | ICD-10-CM

## 2023-10-09 DIAGNOSIS — R296 Repeated falls: Secondary | ICD-10-CM

## 2023-10-09 DIAGNOSIS — I959 Hypotension, unspecified: Secondary | ICD-10-CM

## 2023-10-09 DIAGNOSIS — I4891 Unspecified atrial fibrillation: Secondary | ICD-10-CM

## 2023-10-09 DIAGNOSIS — I5022 Chronic systolic (congestive) heart failure: Secondary | ICD-10-CM

## 2023-10-09 NOTE — Progress Notes (Signed)
Location:  Other [place].  Nursing Home Room Number: [facility] [address] Place of Service:  SNF ([phone]) Abbey Chatters, NP  PCP: Earnestine Mealing, MD  Patient Care Team: Earnestine Mealing, MD as PCP - General (Family Medicine) Marinus Maw, MD as PCP - Electrophysiology (Cardiology) Antonieta Iba, MD as PCP - Cardiology (Cardiology) Lemar Livings Merrily Pew, MD (General Surgery) Sherlene Shams, MD (Internal Medicine) Sherlene Shams, MD (Internal Medicine) Toney Reil, MD as Consulting Physician (Gastroenterology)  Extended Emergency Contact Information Primary Emergency Contact: Fairhurst,brett Address: [address]          [place], [place] [address] [place] Phone: [phone] Relation: Son  Goals of care: Advanced Directive information    10/09/2023    3:03 PM  Advanced Directives  Does Patient Have a Medical Advance Directive? Yes  Type of Estate agent of [place];Out of facility DNR (pink MOST or yellow form);Living will  Does patient want to make changes to medical advance directive? No - Patient declined  Copy of Healthcare Power of Attorney in Chart? Yes - validated most recent copy scanned in chart (See row information)     Chief Complaint  Patient presents with   Acute Visit    Medication Management    HPI:  Pt is a 82 y.o. female seen today for an acute visit for medication questions.  Pt moved from memory care and medications need clarifying.   Pt is excited about the move from memory care.   Pt reports increase in shortness of breath today and states her LE edema has gotten worse, Granddaughter in the room today and reports she has heard some upper airway wheezing when she breaths.   She is not on eliquis due to frequent falls and hx of GI bleed.   Her mood has been doing well recently. She has been on zyprexa for behaviors with her dementia and this has been controlled.   She had low bp but  midodrine added and bp as been well controlled- PRN order was on FL2    Past Medical History:  Diagnosis Date   Acute on chronic diastolic (congestive) heart failure (HCC)    Allergy    See list   Altered mental status 04/23/2023   Anemia 2018   Anxiety    Occasionally take Xanax for sleep   Anxiety associated with depression    Prn alprazolam    Anxiety associated with depression    Arthritis    Hands, Back   Atrial fibrillation, persistent (HCC)    DCCV 08/22/2015   Bradycardia post-op bradycardia, pacer dependent   MDT PPM 11/06/15, Dr. Ladona Ridgel   Cataract    Left eye   Chronic kidney disease (CKD) stage G3a/A2, moderately decreased glomerular filtration rate (GFR) between 45-59 mL/min/1.73 square meter and albuminuria creatinine ratio between 30-299 mg/g (HCC) 11/13/2021   Closed bilateral fracture of pubic rami (HCC) 08/28/2022   Diverticulosis    Focal seizures (HCC)    Heart murmur    Hematuria, gross 04/09/2018   Hemorrhoid    Hepatic cyst    innumerable   History of asbestos exposure    Hyperlipidemia    IBS (irritable bowel syndrome)    Idiopathic thrombocytopenic purpura (ITP) (HCC)    Migraine    MVP (mitral valve prolapse)    Near syncope 01/26/2023   Nodule of right lung    Osteoporosis of forearm    RBBB    Restrictive lung disease  Mild on PFT & likely cardiac in etiology    Right sided sciatica 04/10/2022   S/P Minimally invasive maze operation for atrial fibrillation 10/31/2015   Complete bilateral atrial lesion set using cryothermy and bipolar radiofrequency ablation with clipping of LA appendage via right mini thoracotomy approach   S/P minimally invasive mitral valve replacement with bioprosthetic valve 10/31/2015   33 mm [facility] Mitral bovine bioprosthetic tissue valve placed via right mini thoracotomy approach   Seizures (HCC)    left foot paralysis and left hand paralysis Dr. 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   SVT (supraventricular tachycardia) (HCC)    Thoracic aorta atherosclerosis (HCC)    TIA (transient ischemic attack)    Visit for preventive health examination 05/23/2016   Past Surgical History:  Procedure Laterality Date   BREAST EXCISIONAL BIOPSY Right Late 80s   Negative X2   CARDIAC CATHETERIZATION N/A 10/18/2015   Procedure: Right/Left Heart Cath and Coronary Angiography;  Surgeon: Tonny Bollman, MD;  Location: [facility] INVASIVE CV LAB;  Service: Cardiovascular;  Laterality: N/A;   CARDIOVERSION N/A 08/22/2015   Procedure: CARDIOVERSION;  Surgeon: Vesta Mixer, MD;  Location: [facility] ENDOSCOPY;  Service: Cardiovascular;  Laterality: N/A;   COLONOSCOPY  2003   EP IMPLANTABLE DEVICE N/A 11/06/2015   Procedure: Pacemaker Implant;  Surgeon: Marinus Maw, MD;  Location: [facility] INVASIVE CV LAB;  Service: Cardiovascular;  Laterality: N/A;   EP IMPLANTABLE DEVICE N/A 02/20/2016   Procedure: Lead Extraction;  Surgeon: Marinus Maw, MD;  Location: [facility] INVASIVE CV LAB;  Service: Cardiovascular;  Laterality: N/A;   ESOPHAGOGASTRODUODENOSCOPY (EGD) WITH PROPOFOL N/A 05/19/2023   Procedure: ESOPHAGOGASTRODUODENOSCOPY (EGD) WITH PROPOFOL;  Surgeon: Midge Minium, MD;  Location: [facility] ENDOSCOPY;  Service: Endoscopy;  Laterality: N/A;   EYE SURGERY Right 2013   FEMUR IM NAIL Left 06/18/2021   Procedure: INTRAMEDULLARY (IM) NAIL FEMORAL, OPEN REDUCTION INTERNAL FIXATION FEMUR RIGHT LITTLE FINGER PIP CLOSED REDUCTION;  Surgeon: Myrene Galas, MD;  Location: [facility] OR;  Service: Orthopedics;  Laterality: Left;   FOOT SURGERY     ~2007 right foot bunion   MANDIBLE FRACTURE SURGERY  03/26/2013   MINIMALLY INVASIVE MAZE PROCEDURE N/A 10/31/2015   Procedure: MINIMALLY INVASIVE MAZE PROCEDURE;  Surgeon: Purcell Nails, MD;  Location: [facility] OR;  Service: Open Heart Surgery;  Laterality: N/A;   MITRAL VALVE REPLACEMENT Right 10/31/2015   Procedure: MINIMALLY INVASIVE MITRAL VALVE (MV)  REPLACEMENT;  Surgeon: Purcell Nails, MD;  Location: [facility] OR;  Service: Open Heart Surgery;  Laterality: Right;   PACEMAKER LEAD REMOVAL  02/20/2016   TEE WITH CARDIOVERSION     TEE WITHOUT CARDIOVERSION N/A 08/22/2015   Procedure: TRANSESOPHAGEAL ECHOCARDIOGRAM (TEE);  Surgeon: Vesta Mixer, MD;  Location: [facility] ENDOSCOPY;  Service: Cardiovascular;  Laterality: N/A;   TEE WITHOUT CARDIOVERSION N/A 10/31/2015   Procedure: TRANSESOPHAGEAL ECHOCARDIOGRAM (TEE);  Surgeon: Purcell Nails, MD;  Location: [facility] OR;  Service: Open Heart Surgery;  Laterality: N/A;   TUBAL LIGATION     VARICOSE VEIN SURGERY Right     Allergies  Allergen Reactions   Codeine Nausea And Vomiting and Other (See Comments)    migraine   Epinephrine Palpitations and Shortness Of Breath   Hydrocodone Nausea And Vomiting and Other (See Comments)    MIGRAINE   Hydromorphone Nausea And Vomiting and Other (See Comments)    migraine   Molds & Smuts Anxiety, Other (See  Comments) and Shortness Of Breath   Oxycodone Nausea And Vomiting and Other (See Comments)    Severe migraine   Meloxicam Other (See Comments)    Severe reflux   Codeine Other (See Comments)    Migraine   Dextromethorphan Other (See Comments)    seizures   Diphen [Diphenhydramine Hcl] Other (See Comments)    seizure   Diphenhydramine Other (See Comments)    seizure   Diphenhydramine Hcl     Other reaction(s): Other (See Comments)   Diphenylpyraline Other (See Comments)   Doxycycline Itching, Swelling and Other (See Comments)    Facial   Doxycycline Itching and Swelling   Hydrocodone Other (See Comments)    Migraine   Hydromorphone Other (See Comments)    Migraine   Meloxicam Other (See Comments)   Mobic [Meloxicam] Other (See Comments)    reflux   Nsaids    Nsaids Other (See Comments)    Pt on blood thinner   Oxycodone Other (See Comments)    Migraine   Propofol Other (See Comments)   Propofol Other (See Comments)    Very sensitive; patient  stated she was told she was told she had apnea    Outpatient Encounter Medications as of 10/09/2023  Medication Sig   acetaminophen (TYLENOL) 325 MG tablet Take 650 mg by mouth every 4 (four) hours as needed for mild pain (pain score 1-3) or moderate pain (pain score 4-6). Give two tablets by mouth every 4 hours as needed.   albuterol (PROVENTIL) (2.5 MG/3ML) 0.083% nebulizer solution Take 3 mLs (2.5 mg total) by nebulization every 6 (six) hours as needed for wheezing or shortness of breath.   ARIPiprazole (ABILIFY) 10 MG tablet Take 10 mg by mouth every evening.   bismuth subsalicylate (PEPTO BISMOL) 262 MG/15ML suspension Take 30 mLs by mouth every 6 (six) hours as needed.   fluticasone (FLONASE) 50 MCG/ACT nasal spray Place 2 sprays into both nostrils daily.   furosemide (LASIX) 40 MG tablet Take 40 mg by mouth daily. Take 1/2 tablet by mouth in the afternoon for fluid for 3 days.   gabapentin (NEURONTIN) 100 MG capsule Take 200 mg by mouth 3 (three) times daily.   Iron, Ferrous Sulfate, 325 (65 Fe) MG TABS Take 1 tablet by mouth daily.   lamoTRIgine (LAMICTAL) 150 MG tablet Take 150 mg by mouth daily.   lamoTRIgine (LAMICTAL) 200 MG tablet Take 200 mg by mouth at bedtime.   metoprolol tartrate (LOPRESSOR) 25 MG tablet Take 25 mg by mouth daily.   midodrine (PROAMATINE) 2.5 MG tablet Take 5 mg by mouth 3 (three) times daily with meals. Give one tablet by mouth daily as needed for low BP   montelukast (SINGULAIR) 10 MG tablet Take 1 tablet (10 mg total) by mouth at bedtime.   OLANZapine (ZYPREXA) 5 MG tablet Take 5 mg by mouth at bedtime.   ondansetron (ZOFRAN) 8 MG tablet Take 8 mg by mouth every 8 (eight) hours as needed for nausea or vomiting.   pantoprazole (PROTONIX) 40 MG tablet Take 1 tablet (40 mg total) by mouth 2 (two) times daily before a meal.   polyethylene glycol (MIRALAX / GLYCOLAX) 17 g packet Take 17 g by mouth daily as needed for mild constipation or moderate constipation.    potassium chloride (KLOR-CON) 20 MEQ packet Take 20 mEq by mouth daily.   rosuvastatin (CRESTOR) 40 MG tablet Take 1 tablet (40 mg total) by mouth daily.   VENTOLIN HFA 108 (90 Base) MCG/ACT  inhaler INHALE 2 PUFFS INTO THE LUNGS EVERY 6 HOURS AS NEEDED FOR WHEEZING OR SHORTNESS OF BREATH   [DISCONTINUED] midodrine (PROAMATINE) 5 MG tablet Take 5 mg by mouth 3 (three) times daily.   [DISCONTINUED] OLANZapine (ZYPREXA) 5 MG tablet Take 5 mg by mouth as needed (delusions and agitated restlessness).   No facility-administered encounter medications on file as of 10/09/2023.    Review of Systems  Constitutional:  Negative for activity change, appetite change, fatigue and unexpected weight change.  HENT:  Negative for congestion and hearing loss.   Eyes: Negative.   Respiratory:  Positive for shortness of breath. Negative for cough.   Cardiovascular:  Positive for leg swelling. Negative for chest pain and palpitations.  Gastrointestinal:  Negative for abdominal pain, constipation and diarrhea.  Genitourinary:  Negative for difficulty urinating and dysuria.  Musculoskeletal:  Negative for arthralgias and myalgias.  Skin:  Negative for color change and wound.  Neurological:  Negative for dizziness and weakness.  Psychiatric/Behavioral:  Positive for confusion. Negative for agitation and behavioral problems.     Immunization History  Administered Date(s) Administered   Fluad Quad(high Dose 65+) 08/06/2019, 08/25/2020, 08/23/2021, 08/28/2022   Hep A / Hep B 05/09/2016, 06/11/2016, 11/12/2016   Influenza Split 10/10/2013   Influenza Whole 10/10/2011   Influenza, High Dose Seasonal PF 08/19/2016, 09/05/2017   Influenza,inj,Quad PF,6+ Mos 08/01/2014, 08/04/2015   Influenza-Unspecified 09/05/2017, 09/17/2018, 09/21/2019   Moderna Covid-19 Vaccine Bivalent Booster 61yrs & up 08/31/2021   Moderna Sars-Covid-2 Vaccination 12/16/2019, 01/13/2020, 10/17/2020, 03/16/2021   Pneumococcal Conjugate-13  01/10/2014   Pneumococcal Polysaccharide-23 06/25/2012   Tdap 06/25/2012   Zoster Recombinant(Shingrix) 04/24/2017, 08/26/2017   Zoster, Live 01/10/2006   Pertinent  Health Maintenance Due  Topic Date Due   INFLUENZA VACCINE  07/03/2023   DEXA SCAN  Completed      08/28/2022    4:24 PM 10/03/2022    1:56 PM 02/03/2023   11:01 AM 05/08/2023   12:37 PM 06/26/2023    8:56 AM  Fall Risk  Falls in the past year? 1  1 1 1   Was there an injury with Fall? 1  1 1 1   Fall Risk Category Calculator 2  3 3 3   Fall Risk Category (Retired) Moderate      (RETIRED) Patient Fall Risk Level High fall risk High fall risk     Patient at Risk for Falls Due to History of fall(s);Impaired balance/gait  History of fall(s) History of fall(s) History of fall(s)  Fall risk Follow up Falls evaluation completed  Falls evaluation completed Falls evaluation completed Falls evaluation completed   Functional Status Survey:    Vitals:   10/09/23 1448  BP: 113/74  Pulse: 71  Resp: 18  Temp: (!) 97.3 F (36.3 C)  SpO2: 92%  Weight: 147 lb 9.6 oz (67 kg)  Height: 5\' 5"  (1.651 m)   Body mass index is 24.56 kg/m. Physical Exam Constitutional:      General: She is not in acute distress.    Appearance: She is well-developed. She is not diaphoretic.  HENT:     Head: Normocephalic and atraumatic.     Mouth/Throat:     Pharynx: No oropharyngeal exudate.  Eyes:     Conjunctiva/sclera: Conjunctivae normal.     Pupils: Pupils are equal, round, and reactive to light.  Cardiovascular:     Rate and Rhythm: Normal rate and regular rhythm.     Heart sounds: Normal heart sounds.  Pulmonary:     Effort:  Pulmonary effort is normal.     Breath sounds: Rales (throughout) present.  Abdominal:     General: Bowel sounds are normal.     Palpations: Abdomen is soft.  Musculoskeletal:     Cervical back: Normal range of motion and neck supple.     Right lower leg: No edema.     Left lower leg: No edema.  Skin:     General: Skin is warm and dry.  Neurological:     Mental Status: She is alert. Mental status is at baseline.     Motor: No weakness.  Psychiatric:        Mood and Affect: Mood normal.     Labs reviewed: Recent Labs    04/29/23 1500 05/17/23 1201 05/19/23 0410 05/20/23 0330 06/02/23 1201 06/26/23 0930 08/12/23 0000 10/01/23 1444  NA 141   < > 137   < > 141 142 139 144  K 3.2*   < > 3.5   < > 3.9 3.6 4.0 3.7  CL 102   < > 103   < > 102 101 100 103  CO2 28   < > 25   < > 28 33* 32* 26  GLUCOSE 99   < > 95   < > 86 93  --  139*  BUN 29*   < > 19   < > 29* 34* 29* 27  CREATININE 1.51*   < > 0.96   < > 1.26* 1.51* 1.7* 1.49*  CALCIUM 9.4   < > 8.7*   < > 10.1 10.5 9.3 9.6  MG 2.1  --  2.2  --   --  2.3  --   --    < > = values in this interval not displayed.   Recent Labs    04/29/23 1501 05/17/23 1201 05/26/23 0000 07/01/23 1436 10/01/23 1444  AST 33 47* 26  --  33  ALT 24 26 15   --  16  ALKPHOS 109 123 129*  --  178*  BILITOT 0.9 1.5*  --   --  0.6  PROT 6.1* 6.8  --   --  5.9*  ALBUMIN 3.7 4.3  --  4.3 4.2   Recent Labs    04/17/23 0529 04/19/23 1331 04/23/23 0127 04/29/23 1500 05/19/23 0410 05/20/23 0330 05/26/23 0000 06/02/23 1201  WBC 3.4*   < > 4.3   < > 4.9 4.1  --  4.6  NEUTROABS 2.2  --  3.1  --   --   --   --  3.1  HGB 9.0*   < > 9.8*   < > 8.3* 7.7* 9.4* 8.9 Repeated and verified X2.*  HCT 28.6*   < > 31.2*   < > 27.0* 24.7*  --  27.3*  MCV 95.0   < > 95.1   < > 95.4 95.4  --  92.3  PLT 91*   < > 107*   < > 111* 109* 201 143.0*   < > = values in this interval not displayed.   Lab Results  Component Value Date   TSH 4.842 (H) 04/29/2023   Lab Results  Component Value Date   HGBA1C 5.5 02/11/2020   Lab Results  Component Value Date   CHOL 143 05/21/2019   HDL 59.50 05/21/2019   LDLCALC 71 05/21/2019   LDLDIRECT 113.0 02/15/2016   TRIG 60.0 05/21/2019   CHOLHDL 2 05/21/2019    Significant Diagnostic Results in last 30 days:  CUP  PACEART  REMOTE DEVICE CHECK  Result Date: 09/29/2023 Scheduled remote reviewed. Normal device function.  Several AHR detections, AF burden 100% with episode in progress since 08/31/23.  Known history of AF, Eliquis was discontinued per Epic. Routing to triage for increase in AF burden from previous per protocol. Next remote 91 days. - CS, CVRS   Assessment/Plan 1. Protein deficiency (HCC) -encouraged 3 meals a day with supplement   2. Hypotension, unspecified hypotension type BP has been well controlled on midodrine TID, will dc PRN at this time  3. Moderate dementia with psychotic disturbance, unspecified dementia type (HCC) -dementia is stable, no acute changes in cognitive or functional status Her mood/behaviors have been stable. Will continue Zyprexa at bedtime and DC PRN at this time.   4. Multiple falls High fall precautions. PT will assess now that she is in SNF  5. Chronic kidney disease (CKD) stage G3a/A2, moderately decreased glomerular filtration rate (GFR) between 45-59 mL/min/1.73 square meter and albuminuria creatinine ratio between 30-299 mg/g (HCC) -Chronic and stable Encourage proper hydration Adjust medication as needed  Follow metabolic panel Avoid nephrotoxic meds (NSAIDS)  6. Chronic systolic congestive heart failure (HCC) Some increase in wheezing noted with abnormal breath sounds Will continue lasix 40 in the am and add lasix 20 mg PO in the afternoon Staff continues to monitor daily weights   7. Atrial fibrillation, unspecified type (HCC) Rate controlled, not on anticoagulation due to frequent falls and GI bleed.  Continues on metoprolol for rate control.      Janene Harvey. Biagio Borg [facility] [phone]

## 2023-10-13 ENCOUNTER — Encounter: Payer: Self-pay | Admitting: Oncology

## 2023-10-13 LAB — BASIC METABOLIC PANEL
BUN: 25 — AB (ref 4–21)
CO2: 35 — AB (ref 13–22)
Chloride: 100 (ref 99–108)
Creatinine: 1.5 — AB (ref 0.5–1.1)
Glucose: 76
Potassium: 3.6 meq/L (ref 3.5–5.1)
Sodium: 142 (ref 137–147)

## 2023-10-13 LAB — COMPREHENSIVE METABOLIC PANEL: Calcium: 9.8 (ref 8.7–10.7)

## 2023-10-13 NOTE — Progress Notes (Signed)
Remote pacemaker transmission.   

## 2023-10-15 ENCOUNTER — Non-Acute Institutional Stay (SKILLED_NURSING_FACILITY): Payer: Self-pay | Admitting: Adult Health

## 2023-10-15 ENCOUNTER — Encounter: Payer: Self-pay | Admitting: Adult Health

## 2023-10-15 DIAGNOSIS — F03B2 Unspecified dementia, moderate, with psychotic disturbance: Secondary | ICD-10-CM | POA: Diagnosis not present

## 2023-10-15 DIAGNOSIS — R42 Dizziness and giddiness: Secondary | ICD-10-CM

## 2023-10-15 DIAGNOSIS — I959 Hypotension, unspecified: Secondary | ICD-10-CM

## 2023-10-15 NOTE — Progress Notes (Signed)
Location:  Other [place].  Nursing Home Room Number: [facility] 204A Place of Service:  SNF ([phone]) Provider:  Kenard Gower, DNP, FNP-BC  Patient Care Team: Earnestine Mealing, MD as PCP - General (Family Medicine) Marinus Maw, MD as PCP - Electrophysiology (Cardiology) Antonieta Iba, MD as PCP - Cardiology (Cardiology) Lemar Livings Merrily Pew, MD (General Surgery) Sherlene Shams, MD (Internal Medicine) Sherlene Shams, MD (Internal Medicine) Toney Reil, MD as Consulting Physician (Gastroenterology)  Extended Emergency Contact Information Primary Emergency Contact: Samad,brett Address: [address]          [place], [place] [address] [place] of [place] Phone: [phone] Relation: Son  Code Status:  DNR  Goals of care: Advanced Directive information    10/15/2023   11:52 AM  Advanced Directives  Does Patient Have a Medical Advance Directive? Yes  Type of Estate agent of [place];Out of facility DNR (pink MOST or yellow form);Living will  Does patient want to make changes to medical advance directive? No - Patient declined  Copy of Healthcare Power of Attorney in Chart? Yes - validated most recent copy scanned in chart (See row information)     Chief Complaint  Patient presents with   Acute Visit    Weakness and Dizzy    HPI:  Pt is a 82 y.o. female seen today for Weakness and dizziness. She is a resident of [facility]. She complained of being dizzy today since morning. BP 120/62 and PR 62. She takes Midodrin 2.5 mg TID for hypotension.   She has moderate dementia, BIMS score 12/15 and takes Olanzepine and Abilify.   Past Medical History:  Diagnosis Date   Acute on chronic diastolic (congestive) heart failure (HCC)    Allergy    See list   Altered mental status 04/23/2023   Anemia 2018   Anxiety    Occasionally take Xanax for sleep   Anxiety associated with depression    Prn alprazolam     Anxiety associated with depression    Arthritis    Hands, Back   Atrial fibrillation, persistent (HCC)    DCCV 08/22/2015   Bradycardia post-op bradycardia, pacer dependent   MDT PPM 11/06/15, Dr. Ladona Ridgel   Cataract    Left eye   Chronic kidney disease (CKD) stage G3a/A2, moderately decreased glomerular filtration rate (GFR) between 45-59 mL/min/1.73 square meter and albuminuria creatinine ratio between 30-299 mg/g (HCC) 11/13/2021   Closed bilateral fracture of pubic rami (HCC) 08/28/2022   Diverticulosis    Focal seizures (HCC)    Heart murmur    Hematuria, gross 04/09/2018   Hemorrhoid    Hepatic cyst    innumerable   History of asbestos exposure    Hyperlipidemia    IBS (irritable bowel syndrome)    Idiopathic thrombocytopenic purpura (ITP) (HCC)    Migraine    MVP (mitral valve prolapse)    Near syncope 01/26/2023   Nodule of right lung    Osteoporosis of forearm    RBBB    Restrictive lung disease    Mild on PFT & likely cardiac in etiology    Right sided sciatica 04/10/2022   S/P Minimally invasive maze operation for atrial fibrillation 10/31/2015   Complete bilateral atrial lesion set using cryothermy and bipolar radiofrequency ablation with clipping of LA appendage via right mini thoracotomy approach   S/P minimally invasive mitral valve replacement with bioprosthetic valve 10/31/2015   33 mm [facility] Mitral bovine bioprosthetic  tissue valve placed via right mini thoracotomy approach   Seizures (HCC)    left foot paralysis and left hand paralysis Dr. 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   SVT (supraventricular tachycardia) (HCC)    Thoracic aorta atherosclerosis (HCC)    TIA (transient ischemic attack)    Visit for preventive health examination 05/23/2016   Past Surgical History:  Procedure Laterality Date   BREAST EXCISIONAL BIOPSY Right Late 80s   Negative X2   CARDIAC CATHETERIZATION N/A 10/18/2015    Procedure: Right/Left Heart Cath and Coronary Angiography;  Surgeon: Tonny Bollman, MD;  Location: [facility] INVASIVE CV LAB;  Service: Cardiovascular;  Laterality: N/A;   CARDIOVERSION N/A 08/22/2015   Procedure: CARDIOVERSION;  Surgeon: Vesta Mixer, MD;  Location: [facility] ENDOSCOPY;  Service: Cardiovascular;  Laterality: N/A;   COLONOSCOPY  2003   EP IMPLANTABLE DEVICE N/A 11/06/2015   Procedure: Pacemaker Implant;  Surgeon: Marinus Maw, MD;  Location: [facility] INVASIVE CV LAB;  Service: Cardiovascular;  Laterality: N/A;   EP IMPLANTABLE DEVICE N/A 02/20/2016   Procedure: Lead Extraction;  Surgeon: Marinus Maw, MD;  Location: [facility] INVASIVE CV LAB;  Service: Cardiovascular;  Laterality: N/A;   ESOPHAGOGASTRODUODENOSCOPY (EGD) WITH PROPOFOL N/A 05/19/2023   Procedure: ESOPHAGOGASTRODUODENOSCOPY (EGD) WITH PROPOFOL;  Surgeon: Midge Minium, MD;  Location: [facility] ENDOSCOPY;  Service: Endoscopy;  Laterality: N/A;   EYE SURGERY Right 2013   FEMUR IM NAIL Left 06/18/2021   Procedure: INTRAMEDULLARY (IM) NAIL FEMORAL, OPEN REDUCTION INTERNAL FIXATION FEMUR RIGHT LITTLE FINGER PIP CLOSED REDUCTION;  Surgeon: Myrene Galas, MD;  Location: [facility] OR;  Service: Orthopedics;  Laterality: Left;   FOOT SURGERY     ~2007 right foot bunion   MANDIBLE FRACTURE SURGERY  03/26/2013   MINIMALLY INVASIVE MAZE PROCEDURE N/A 10/31/2015   Procedure: MINIMALLY INVASIVE MAZE PROCEDURE;  Surgeon: Purcell Nails, MD;  Location: [facility] OR;  Service: Open Heart Surgery;  Laterality: N/A;   MITRAL VALVE REPLACEMENT Right 10/31/2015   Procedure: MINIMALLY INVASIVE MITRAL VALVE (MV) REPLACEMENT;  Surgeon: Purcell Nails, MD;  Location: [facility] OR;  Service: Open Heart Surgery;  Laterality: Right;   PACEMAKER LEAD REMOVAL  02/20/2016   TEE WITH CARDIOVERSION     TEE WITHOUT CARDIOVERSION N/A 08/22/2015   Procedure: TRANSESOPHAGEAL ECHOCARDIOGRAM (TEE);  Surgeon: Vesta Mixer, MD;  Location: [facility] ENDOSCOPY;  Service: Cardiovascular;  Laterality:  N/A;   TEE WITHOUT CARDIOVERSION N/A 10/31/2015   Procedure: TRANSESOPHAGEAL ECHOCARDIOGRAM (TEE);  Surgeon: Purcell Nails, MD;  Location: [facility] OR;  Service: Open Heart Surgery;  Laterality: N/A;   TUBAL LIGATION     VARICOSE VEIN SURGERY Right     Allergies  Allergen Reactions   Codeine Nausea And Vomiting and Other (See Comments)    migraine   Epinephrine Palpitations and Shortness Of Breath   Hydrocodone Nausea And Vomiting and Other (See Comments)    MIGRAINE   Hydromorphone Nausea And Vomiting and Other (See Comments)    migraine   Molds & Smuts Anxiety, Other (See Comments) and Shortness Of Breath   Oxycodone Nausea And Vomiting and Other (See Comments)    Severe migraine   Meloxicam Other (See Comments)    Severe reflux   Codeine Other (See Comments)    Migraine   Dextromethorphan Other (See Comments)    seizures   Diphen [Diphenhydramine Hcl] Other (See Comments)    seizure   Diphenhydramine Other (See Comments)  seizure   Diphenhydramine Hcl     Other reaction(s): Other (See Comments)   Diphenylpyraline Other (See Comments)   Doxycycline Itching, Swelling and Other (See Comments)    Facial   Doxycycline Itching and Swelling   Hydrocodone Other (See Comments)    Migraine   Hydromorphone Other (See Comments)    Migraine   Meloxicam Other (See Comments)   Mobic [Meloxicam] Other (See Comments)    reflux   Nsaids    Nsaids Other (See Comments)    Pt on blood thinner   Oxycodone Other (See Comments)    Migraine   Propofol Other (See Comments)   Propofol Other (See Comments)    Very sensitive; patient stated she was told she was told she had apnea    Outpatient Encounter Medications as of 10/15/2023  Medication Sig   acetaminophen (TYLENOL) 325 MG tablet Take 650 mg by mouth every 4 (four) hours as needed for mild pain (pain score 1-3) or moderate pain (pain score 4-6). Give two tablets by mouth every 4 hours as needed.   albuterol (PROVENTIL) (2.5 MG/3ML)  0.083% nebulizer solution Take 3 mLs (2.5 mg total) by nebulization every 6 (six) hours as needed for wheezing or shortness of breath.   ARIPiprazole (ABILIFY) 10 MG tablet Take 10 mg by mouth every evening.   bismuth subsalicylate (PEPTO BISMOL) 262 MG/15ML suspension Take 30 mLs by mouth every 6 (six) hours as needed.   fluticasone (FLONASE) 50 MCG/ACT nasal spray Place 2 sprays into both nostrils daily.   furosemide (LASIX) 40 MG tablet Take 40 mg by mouth daily.   gabapentin (NEURONTIN) 100 MG capsule Take 200 mg by mouth 3 (three) times daily.   Iron, Ferrous Sulfate, 325 (65 Fe) MG TABS Take 1 tablet by mouth daily.   lamoTRIgine (LAMICTAL) 150 MG tablet Take 150 mg by mouth daily.   lamoTRIgine (LAMICTAL) 200 MG tablet Take 200 mg by mouth at bedtime.   metoprolol tartrate (LOPRESSOR) 25 MG tablet Take 25 mg by mouth daily.   midodrine (PROAMATINE) 2.5 MG tablet Take 5 mg by mouth 3 (three) times daily with meals. Give one tablet by mouth daily as needed for low BP   montelukast (SINGULAIR) 10 MG tablet Take 1 tablet (10 mg total) by mouth at bedtime.   OLANZapine (ZYPREXA) 5 MG tablet Take 5 mg by mouth at bedtime.   ondansetron (ZOFRAN) 8 MG tablet Take 8 mg by mouth every 8 (eight) hours as needed for nausea or vomiting.   pantoprazole (PROTONIX) 40 MG tablet Take 1 tablet (40 mg total) by mouth 2 (two) times daily before a meal.   polyethylene glycol (MIRALAX / GLYCOLAX) 17 g packet Take 17 g by mouth daily as needed for mild constipation or moderate constipation.   potassium chloride (KLOR-CON) 20 MEQ packet Take 20 mEq by mouth daily.   rosuvastatin (CRESTOR) 40 MG tablet Take 1 tablet (40 mg total) by mouth daily.   VENTOLIN HFA 108 (90 Base) MCG/ACT inhaler INHALE 2 PUFFS INTO THE LUNGS EVERY 6 HOURS AS NEEDED FOR WHEEZING OR SHORTNESS OF BREATH   No facility-administered encounter medications on file as of 10/15/2023.    Review of Systems  Constitutional:  Negative for  appetite change, chills, fatigue and fever.  HENT:  Negative for congestion, hearing loss, rhinorrhea and sore throat.   Eyes: Negative.   Respiratory:  Negative for cough, shortness of breath and wheezing.   Cardiovascular:  Negative for chest pain, palpitations and leg  swelling.  Gastrointestinal:  Negative for abdominal pain, constipation, diarrhea, nausea and vomiting.  Genitourinary:  Negative for dysuria.  Musculoskeletal:  Negative for arthralgias, back pain and myalgias.  Skin:  Negative for color change, rash and wound.  Neurological:  Positive for dizziness and weakness. Negative for headaches.  Psychiatric/Behavioral:  Negative for behavioral problems. The patient is not nervous/anxious.       Immunization History  Administered Date(s) Administered   Fluad Quad(high Dose 65+) 08/06/2019, 08/25/2020, 08/23/2021, 08/28/2022   Hep A / Hep B 05/09/2016, 06/11/2016, 11/12/2016   Influenza Split 10/10/2013   Influenza Whole 10/10/2011   Influenza, High Dose Seasonal PF 08/19/2016, 09/05/2017   Influenza,inj,Quad PF,6+ Mos 08/01/2014, 08/04/2015   Influenza-Unspecified 09/05/2017, 09/17/2018, 09/21/2019   Moderna Covid-19 Vaccine Bivalent Booster 69yrs & up 08/31/2021   Moderna Sars-Covid-2 Vaccination 12/16/2019, 01/13/2020, 10/17/2020, 03/16/2021   PNEUMOCOCCAL CONJUGATE-20 10/11/2023   Pneumococcal Conjugate-13 01/10/2014   Pneumococcal Polysaccharide-23 06/25/2012   RSV,unspecified 10/11/2023   Tdap 06/25/2012   Zoster Recombinant(Shingrix) 04/24/2017, 08/26/2017   Zoster, Live 01/10/2006   Pertinent  Health Maintenance Due  Topic Date Due   INFLUENZA VACCINE  07/03/2023   DEXA SCAN  Completed      08/28/2022    4:24 PM 10/03/2022    1:56 PM 02/03/2023   11:01 AM 05/08/2023   12:37 PM 06/26/2023    8:56 AM  Fall Risk  Falls in the past year? 1  1 1 1   Was there an injury with Fall? 1  1 1 1   Fall Risk Category Calculator 2  3 3 3   Fall Risk Category (Retired)  Moderate      (RETIRED) Patient Fall Risk Level High fall risk High fall risk     Patient at Risk for Falls Due to History of fall(s);Impaired balance/gait  History of fall(s) History of fall(s) History of fall(s)  Fall risk Follow up Falls evaluation completed  Falls evaluation completed Falls evaluation completed Falls evaluation completed     Vitals:   10/15/23 1144  BP: 120/62  Pulse: 62  Resp: 18  Temp: 97.6 F (36.4 C)  SpO2: 95%  Weight: 104 lb 12.8 oz (47.5 kg)  Height: 5\' 5"  (1.651 m)   Body mass index is 17.44 kg/m.  Physical Exam Musculoskeletal:        General: Swelling present.     Right lower leg: Edema present.     Left lower leg: Edema present.     Comments: Bilateral hand fingers with arthritic deformities, BLE has 2+edema  Neurological:     Mental Status: Mental status is at baseline.  Psychiatric:        Mood and Affect: Mood normal.        Behavior: Behavior normal.      Labs reviewed: Recent Labs    04/29/23 1500 05/17/23 1201 05/19/23 0410 05/20/23 0330 06/02/23 1201 06/26/23 0930 08/12/23 0000 10/01/23 1444  NA 141   < > 137   < > 141 142 139 144  K 3.2*   < > 3.5   < > 3.9 3.6 4.0 3.7  CL 102   < > 103   < > 102 101 100 103  CO2 28   < > 25   < > 28 33* 32* 26  GLUCOSE 99   < > 95   < > 86 93  --  139*  BUN 29*   < > 19   < > 29* 34* 29* 27  CREATININE 1.51*   < >  0.96   < > 1.26* 1.51* 1.7* 1.49*  CALCIUM 9.4   < > 8.7*   < > 10.1 10.5 9.3 9.6  MG 2.1  --  2.2  --   --  2.3  --   --    < > = values in this interval not displayed.   Recent Labs    04/29/23 1501 05/17/23 1201 05/26/23 0000 07/01/23 1436 10/01/23 1444  AST 33 47* 26  --  33  ALT 24 26 15   --  16  ALKPHOS 109 123 129*  --  178*  BILITOT 0.9 1.5*  --   --  0.6  PROT 6.1* 6.8  --   --  5.9*  ALBUMIN 3.7 4.3  --  4.3 4.2   Recent Labs    04/17/23 0529 04/19/23 1331 04/23/23 0127 04/29/23 1500 05/19/23 0410 05/20/23 0330 05/26/23 0000 06/02/23 1201  WBC  3.4*   < > 4.3   < > 4.9 4.1  --  4.6  NEUTROABS 2.2  --  3.1  --   --   --   --  3.1  HGB 9.0*   < > 9.8*   < > 8.3* 7.7* 9.4* 8.9 Repeated and verified X2.*  HCT 28.6*   < > 31.2*   < > 27.0* 24.7*  --  27.3*  MCV 95.0   < > 95.1   < > 95.4 95.4  --  92.3  PLT 91*   < > 107*   < > 111* 109* 201 143.0*   < > = values in this interval not displayed.   Lab Results  Component Value Date   TSH 4.842 (H) 04/29/2023   Lab Results  Component Value Date   HGBA1C 5.5 02/11/2020   Lab Results  Component Value Date   CHOL 143 05/21/2019   HDL 59.50 05/21/2019   LDLCALC 71 05/21/2019   LDLDIRECT 113.0 02/15/2016   TRIG 60.0 05/21/2019   CHOLHDL 2 05/21/2019    Significant Diagnostic Results in last 30 days:  CUP PACEART REMOTE DEVICE CHECK  Result Date: 09/29/2023 Scheduled remote reviewed. Normal device function.  Several AHR detections, AF burden 100% with episode in progress since 08/31/23.  Known history of AF, Eliquis was discontinued per Epic. Routing to triage for increase in AF burden from previous per protocol. Next remote 91 days. - CS, CVRS   Assessment/Plan  1. Vertigo -  will start on meclizine 12.5 mg twice a day PRN -  fall precautions  2. Hypotension, unspecified hypotension type -  BP 120/62, stable -  continue midodrine  3. Moderate dementia with psychotic disturbance, unspecified dementia type (HCC) -  BIMS score 12/15, ranging as moderate cognitive impairment -   Continue olanzapine and Abilify -  Continue supportive care   Family/ staff Communication: Discussed plan of care with resident and charge nurse.  Labs/tests ordered: BMP    Kenard Gower, DNP, MSN, FNP-BC [facility] [phone] (Monday-Friday 8:00 a.m. - 5:00 p.m.) [phone] (after hours)

## 2023-10-22 ENCOUNTER — Non-Acute Institutional Stay (SKILLED_NURSING_FACILITY): Payer: Medicare Other | Admitting: Student

## 2023-10-22 ENCOUNTER — Encounter: Payer: Self-pay | Admitting: Student

## 2023-10-22 DIAGNOSIS — F03B2 Unspecified dementia, moderate, with psychotic disturbance: Secondary | ICD-10-CM

## 2023-10-22 DIAGNOSIS — N1831 Chronic kidney disease, stage 3a: Secondary | ICD-10-CM | POA: Diagnosis not present

## 2023-10-22 DIAGNOSIS — I5022 Chronic systolic (congestive) heart failure: Secondary | ICD-10-CM | POA: Diagnosis not present

## 2023-10-22 DIAGNOSIS — I959 Hypotension, unspecified: Secondary | ICD-10-CM | POA: Diagnosis not present

## 2023-10-22 DIAGNOSIS — R296 Repeated falls: Secondary | ICD-10-CM

## 2023-10-22 DIAGNOSIS — E46 Unspecified protein-calorie malnutrition: Secondary | ICD-10-CM

## 2023-10-22 DIAGNOSIS — I4891 Unspecified atrial fibrillation: Secondary | ICD-10-CM

## 2023-10-22 NOTE — Progress Notes (Signed)
Provider:   [name] Room Number: [id number] Place of Service:  SNF (31)  PCP: Earnestine Mealing, MD Patient Care Team: Earnestine Mealing, MD as PCP - General (Family Medicine) Marinus Maw, MD as PCP - Electrophysiology (Cardiology) Antonieta Iba, MD as PCP - Cardiology (Cardiology) Lemar Livings, Merrily Pew, MD (General Surgery) Sherlene Shams, MD (Internal Medicine) Sherlene Shams, MD (Internal Medicine) Toney Reil, MD as Consulting Physician (Gastroenterology)  Extended Emergency Contact Information Primary Emergency Contact: Zupko,brett Address: [address]          [place], [place] [address] D[place] of [employer] Phone: [phone] Relation: Son  Code Status: DNR Goals of Care: Advanced Directive information    10/22/2023    8:56 AM  Advanced Directives  Does Patient Have a Medical Advance Directive? Yes  Type of Estate agent of [place];Out of facility DNR (pink MOST or yellow form);Living will  Does patient want to make changes to medical advance directive? No - Patient declined  Copy of Healthcare Power of Attorney in Chart? Yes - validated most recent copy scanned in chart (See row information)  Pre-existing out of facility DNR order (yellow form or pink MOST form) Yellow form placed in chart (order not valid for inpatient use)      Chief Complaint  Patient presents with   New Admit To SNF    New admission  to [facility]     HPI: Patient is a 82 y.o. female seen today for admission to [facility] from [facility]. Patient states she has been doing well. She has lived on campus since 2017. She denies any acute medical concerns. She states her primary goal is to get to the point to where she can move to assisted living. Eating well. Refusing assistance on multiple occasions as she wants to work on her independence. When asked about the fall, patient states she never fell and  was never on the floor.   Nursing state patient has asked to schedule and appointment with ophthalmology for 'double vision.' Patient had a fall yesterday and was found on the floor.   Past Medical History:  Diagnosis Date   Acute on chronic diastolic (congestive) heart failure (HCC)    Allergy    See list   Altered mental status 04/23/2023   Anemia 2018   Anxiety    Occasionally take Xanax for sleep   Anxiety associated with depression    Prn alprazolam    Anxiety associated with depression    Arthritis    Hands, Back   Atrial fibrillation, persistent (HCC)    DCCV 08/22/2015   Bradycardia post-op bradycardia, pacer dependent   MDT PPM 11/06/15, Dr. Ladona Ridgel   Cataract    Left eye   Chronic kidney disease (CKD) stage G3a/A2, moderately decreased glomerular filtration rate (GFR) between 45-59 mL/min/1.73 square meter and albuminuria creatinine ratio between 30-299 mg/g (HCC) 11/13/2021   Closed bilateral fracture of pubic rami (HCC) 08/28/2022   Diverticulosis    Focal seizures (HCC)    Heart murmur    Hematuria, gross 04/09/2018   Hemorrhoid    Hepatic cyst    innumerable   History of asbestos exposure    Hyperlipidemia    IBS (irritable bowel syndrome)    Idiopathic thrombocytopenic purpura (ITP) (HCC)    Migraine    MVP (mitral valve prolapse)    Near syncope 01/26/2023   Nodule of right lung  Osteoporosis of forearm    RBBB    Restrictive lung disease    Mild on PFT & likely cardiac in etiology    Right sided sciatica 04/10/2022   S/P Minimally invasive maze operation for atrial fibrillation 10/31/2015   Complete bilateral atrial lesion set using cryothermy and bipolar radiofrequency ablation with clipping of LA appendage via right mini thoracotomy approach   S/P minimally invasive mitral valve replacement with bioprosthetic valve 10/31/2015   33 mm [facility] Mitral bovine bioprosthetic tissue valve placed via right mini thoracotomy approach   Seizures (HCC)     left foot paralysis and left hand paralysis Dr. 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   SVT (supraventricular tachycardia) (HCC)    Thoracic aorta atherosclerosis (HCC)    TIA (transient ischemic attack)    Visit for preventive health examination 05/23/2016   Past Surgical History:  Procedure Laterality Date   BREAST EXCISIONAL BIOPSY Right Late 80s   Negative X2   CARDIAC CATHETERIZATION N/A 10/18/2015   Procedure: Right/Left Heart Cath and Coronary Angiography;  Surgeon: Tonny Bollman, MD;  Location: [facility] INVASIVE CV LAB;  Service: Cardiovascular;  Laterality: N/A;   CARDIOVERSION N/A 08/22/2015   Procedure: CARDIOVERSION;  Surgeon: Vesta Mixer, MD;  Location: [facility] ENDOSCOPY;  Service: Cardiovascular;  Laterality: N/A;   COLONOSCOPY  2003   EP IMPLANTABLE DEVICE N/A 11/06/2015   Procedure: Pacemaker Implant;  Surgeon: Marinus Maw, MD;  Location: [facility] INVASIVE CV LAB;  Service: Cardiovascular;  Laterality: N/A;   EP IMPLANTABLE DEVICE N/A 02/20/2016   Procedure: Lead Extraction;  Surgeon: Marinus Maw, MD;  Location: [facility] INVASIVE CV LAB;  Service: Cardiovascular;  Laterality: N/A;   ESOPHAGOGASTRODUODENOSCOPY (EGD) WITH PROPOFOL N/A 05/19/2023   Procedure: ESOPHAGOGASTRODUODENOSCOPY (EGD) WITH PROPOFOL;  Surgeon: Midge Minium, MD;  Location: [facility] ENDOSCOPY;  Service: Endoscopy;  Laterality: N/A;   EYE SURGERY Right 2013   FEMUR IM NAIL Left 06/18/2021   Procedure: INTRAMEDULLARY (IM) NAIL FEMORAL, OPEN REDUCTION INTERNAL FIXATION FEMUR RIGHT LITTLE FINGER PIP CLOSED REDUCTION;  Surgeon: Myrene Galas, MD;  Location: [facility] OR;  Service: Orthopedics;  Laterality: Left;   FOOT SURGERY     ~2007 right foot bunion   MANDIBLE FRACTURE SURGERY  03/26/2013   MINIMALLY INVASIVE MAZE PROCEDURE N/A 10/31/2015   Procedure: MINIMALLY INVASIVE MAZE PROCEDURE;  Surgeon: Purcell Nails, MD;  Location: [facility] OR;  Service: Open Heart Surgery;   Laterality: N/A;   MITRAL VALVE REPLACEMENT Right 10/31/2015   Procedure: MINIMALLY INVASIVE MITRAL VALVE (MV) REPLACEMENT;  Surgeon: Purcell Nails, MD;  Location: [facility] OR;  Service: Open Heart Surgery;  Laterality: Right;   PACEMAKER LEAD REMOVAL  02/20/2016   TEE WITH CARDIOVERSION     TEE WITHOUT CARDIOVERSION N/A 08/22/2015   Procedure: TRANSESOPHAGEAL ECHOCARDIOGRAM (TEE);  Surgeon: Vesta Mixer, MD;  Location: [facility] ENDOSCOPY;  Service: Cardiovascular;  Laterality: N/A;   TEE WITHOUT CARDIOVERSION N/A 10/31/2015   Procedure: TRANSESOPHAGEAL ECHOCARDIOGRAM (TEE);  Surgeon: Purcell Nails, MD;  Location: [facility] OR;  Service: Open Heart Surgery;  Laterality: N/A;   TUBAL LIGATION     VARICOSE VEIN SURGERY Right     reports that she has never smoked. She has never used smokeless tobacco. She reports that she does not drink alcohol and does not use drugs. Social History   Socioeconomic History   Marital status: Widowed    Spouse name: Deceased   Number of  children: 2   Years of education: 82   Highest education level: Master's degree (e.g., MA, MS, MEng, MEd, MSW, MBA)  Occupational History   Occupation: Retired  Tobacco Use   Smoking status: Never   Smokeless tobacco: Never  Vaping Use   Vaping status: Never Used  Substance and Sexual Activity   Alcohol use: No    Alcohol/week: 3.0 standard drinks of alcohol    Types: 3 Standard drinks or equivalent per week   Drug use: No   Sexual activity: Not Currently  Other Topics Concern   Not on file  Social History Narrative   ** Merged History Encounter **       She is a widow.  Husband died from metastatic renal cell cancer. Originally from [place]. Previously lived in [place] from 1975-[place]. Moved to [place] in 2007. No mold exposure recently but did have it through a prior work exposure in 1993. Has a masters in    public health. No bird exposure.  [facility] (09/29/17): She has moved into a retirement community since last  appointment. She reports she had testing at her new residence that was positive for mold. It has since been treated.    Social Determinants of Health   Financial Resource Strain: Low Risk  (03/17/2023)   Overall Financial Resource Strain (CARDIA)    Difficulty of Paying Living Expenses: Not hard at all  Food Insecurity: No Food Insecurity (05/17/2023)   Hunger Vital Sign    Worried About Running Out of Food in the Last Year: Never true    Ran Out of Food in the Last Year: Never true  Transportation Needs: No Transportation Needs (05/17/2023)   PRAPARE - Administrator, Civil Service (Medical): No    Lack of Transportation (Non-Medical): No  Physical Activity: Unknown (03/17/2023)   Exercise Vital Sign    Days of Exercise per Week: Patient declined    Minutes of Exercise per Session: Not on file  Stress: No Stress Concern Present (03/17/2023)   [employer] of Occupational Health - Occupational Stress Questionnaire    Feeling of Stress : Only a little  Social Connections: Unknown (03/17/2023)   Social Connection and Isolation Panel [NHANES]    Frequency of Communication with Friends and Family: Patient declined    Frequency of Social Gatherings with Friends and Family: Once a week    Attends Religious Services: Patient declined    Database administrator or Organizations: Yes    Attends Banker Meetings: Patient declined    Marital Status: Widowed  Intimate Partner Violence: Not At Risk (05/17/2023)   Humiliation, Afraid, Rape, and Kick questionnaire    Fear of Current or Ex-Partner: No    Emotionally Abused: No    Physically Abused: No    Sexually Abused: No    Functional Status Survey:    Family History  Problem Relation Age of Onset   Hypertension Mother    Arrhythmia Mother    Heart failure Mother    Arrhythmia Brother    Stroke Brother 102       cerebral hemorrhage, nonsmoker, no HTN   Prostate cancer Brother    Stroke Father        from an  aneurysm   Stroke Maternal Aunt 83       cerebral hemorrhage   Liver cancer Maternal Grandmother    Atrial fibrillation Son    Celiac disease Son    Heart attack Neg Hx  Breast cancer Neg Hx     Health Maintenance  Topic Date Due   DTaP/Tdap/Td (2 - Td or Tdap) 06/25/2022   INFLUENZA VACCINE  07/03/2023   COVID-19 Vaccine (6 - 2023-24 season) 08/03/2023   Pneumonia Vaccine 52+ Years old  Completed   DEXA SCAN  Completed   Zoster Vaccines- Shingrix  Completed   HPV VACCINES  Aged Out   Hepatitis C Screening  Discontinued    Allergies  Allergen Reactions   Codeine Nausea And Vomiting and Other (See Comments)    migraine   Epinephrine Palpitations and Shortness Of Breath   Hydrocodone Nausea And Vomiting and Other (See Comments)    MIGRAINE   Hydromorphone Nausea And Vomiting and Other (See Comments)    migraine   Molds & Smuts Anxiety, Other (See Comments) and Shortness Of Breath   Oxycodone Nausea And Vomiting and Other (See Comments)    Severe migraine   Meloxicam Other (See Comments)    Severe reflux   Codeine Other (See Comments)    Migraine   Dextromethorphan Other (See Comments)    seizures   Diphen [Diphenhydramine Hcl] Other (See Comments)    seizure   Diphenhydramine Other (See Comments)    seizure   Diphenhydramine Hcl     Other reaction(s): Other (See Comments)   Diphenylpyraline Other (See Comments)   Doxycycline Itching, Swelling and Other (See Comments)    Facial   Doxycycline Itching and Swelling   Hydrocodone Other (See Comments)    Migraine   Hydromorphone Other (See Comments)    Migraine   Meloxicam Other (See Comments)   Mobic [Meloxicam] Other (See Comments)    reflux   Nsaids    Nsaids Other (See Comments)    Pt on blood thinner   Oxycodone Other (See Comments)    Migraine   Propofol Other (See Comments)   Propofol Other (See Comments)    Very sensitive; patient stated she was told she was told she had apnea    Outpatient  Encounter Medications as of 10/22/2023  Medication Sig   acetaminophen (TYLENOL) 325 MG tablet Take 650 mg by mouth every 4 (four) hours as needed for mild pain (pain score 1-3) or moderate pain (pain score 4-6). Give two tablets by mouth every 4 hours as needed.   albuterol (PROVENTIL) (2.5 MG/3ML) 0.083% nebulizer solution Take 3 mLs (2.5 mg total) by nebulization every 6 (six) hours as needed for wheezing or shortness of breath.   ARIPiprazole (ABILIFY) 10 MG tablet Take 10 mg by mouth every evening.   bismuth subsalicylate (PEPTO BISMOL) 262 MG/15ML suspension Take 30 mLs by mouth every 6 (six) hours as needed.   fluticasone (FLONASE) 50 MCG/ACT nasal spray Place 2 sprays into both nostrils daily.   furosemide (LASIX) 40 MG tablet Take 40 mg by mouth daily.   gabapentin (NEURONTIN) 100 MG capsule Take 200 mg by mouth 3 (three) times daily.   Iron, Ferrous Sulfate, 325 (65 Fe) MG TABS Take 1 tablet by mouth daily.   lamoTRIgine (LAMICTAL) 150 MG tablet Take 150 mg by mouth daily.   lamoTRIgine (LAMICTAL) 200 MG tablet Take 200 mg by mouth at bedtime.   metoprolol tartrate (LOPRESSOR) 25 MG tablet Take 25 mg by mouth daily.   midodrine (PROAMATINE) 2.5 MG tablet Take 5 mg by mouth 3 (three) times daily with meals. Give one tablet by mouth daily as needed for low BP   montelukast (SINGULAIR) 10 MG tablet Take 1 tablet (10 mg  total) by mouth at bedtime.   OLANZapine (ZYPREXA) 5 MG tablet Take 5 mg by mouth at bedtime.   ondansetron (ZOFRAN) 8 MG tablet Take 8 mg by mouth every 8 (eight) hours as needed for nausea or vomiting.   pantoprazole (PROTONIX) 40 MG tablet Take 1 tablet (40 mg total) by mouth 2 (two) times daily before a meal.   polyethylene glycol (MIRALAX / GLYCOLAX) 17 g packet Take 17 g by mouth daily as needed for mild constipation or moderate constipation.   potassium chloride (KLOR-CON) 20 MEQ packet Take 20 mEq by mouth daily.   rosuvastatin (CRESTOR) 40 MG tablet Take 1 tablet  (40 mg total) by mouth daily.   VENTOLIN HFA 108 (90 Base) MCG/ACT inhaler INHALE 2 PUFFS INTO THE LUNGS EVERY 6 HOURS AS NEEDED FOR WHEEZING OR SHORTNESS OF BREATH   No facility-administered encounter medications on file as of 10/22/2023.    Review of Systems  Vitals:   10/22/23 0853  BP: (!) 93/57  Pulse: 69  Resp: 17  Temp: (!) 97.2 F (36.2 C)  SpO2: 95%  Weight: 145 lb 12.8 oz (66.1 kg)  Height: 5\' 5"  (1.651 m)   Body mass index is 24.26 kg/m. Physical Exam Constitutional:      Comments: Thin, frail  Cardiovascular:     Rate and Rhythm: Normal rate and regular rhythm.  Pulmonary:     Effort: Pulmonary effort is normal.  Abdominal:     General: Abdomen is flat.  Musculoskeletal:     Comments: 2+ pitting edema bilateral lower extremities to the shins. Cold to the touch  Neurological:     Mental Status: She is alert.     Labs reviewed: Basic Metabolic Panel: Recent Labs    04/29/23 1500 05/17/23 1201 05/19/23 0410 05/20/23 0330 06/02/23 1201 06/26/23 0930 08/12/23 0000 10/01/23 1444 10/13/23 0000  NA 141   < > 137   < > 141 142 139 144 142  K 3.2*   < > 3.5   < > 3.9 3.6 4.0 3.7 3.6  CL 102   < > 103   < > 102 101 100 103 100  CO2 28   < > 25   < > 28 33* 32* 26 35*  GLUCOSE 99   < > 95   < > 86 93  --  139*  --   BUN 29*   < > 19   < > 29* 34* 29* 27 25*  CREATININE 1.51*   < > 0.96   < > 1.26* 1.51* 1.7* 1.49* 1.5*  CALCIUM 9.4   < > 8.7*   < > 10.1 10.5 9.3 9.6 9.8  MG 2.1  --  2.2  --   --  2.3  --   --   --    < > = values in this interval not displayed.   Liver Function Tests: Recent Labs    04/29/23 1501 05/17/23 1201 05/26/23 0000 07/01/23 1436 10/01/23 1444  AST 33 47* 26  --  33  ALT 24 26 15   --  16  ALKPHOS 109 123 129*  --  178*  BILITOT 0.9 1.5*  --   --  0.6  PROT 6.1* 6.8  --   --  5.9*  ALBUMIN 3.7 4.3  --  4.3 4.2   Recent Labs    04/29/23 1501  LIPASE 84*   Recent Labs    04/29/23 2118  AMMONIA 22    CBC: Recent Labs  04/17/23 0529 04/19/23 1331 04/23/23 0127 04/29/23 1500 05/19/23 0410 05/20/23 0330 05/26/23 0000 06/02/23 1201  WBC 3.4*   < > 4.3   < > 4.9 4.1  --  4.6  NEUTROABS 2.2  --  3.1  --   --   --   --  3.1  HGB 9.0*   < > 9.8*   < > 8.3* 7.7* 9.4* 8.9 Repeated and verified X2.*  HCT 28.6*   < > 31.2*   < > 27.0* 24.7*  --  27.3*  MCV 95.0   < > 95.1   < > 95.4 95.4  --  92.3  PLT 91*   < > 107*   < > 111* 109* 201 143.0*   < > = values in this interval not displayed.   Cardiac Enzymes: Recent Labs    01/26/23 1019  CKTOTAL 115   BNP: Invalid input(s): "POCBNP" Lab Results  Component Value Date   HGBA1C 5.5 02/11/2020   Lab Results  Component Value Date   TSH 4.842 (H) 04/29/2023   Lab Results  Component Value Date   VITAMINB12 3,392 (H) 04/23/2023   Lab Results  Component Value Date   FOLATE 25.0 05/18/2023   Lab Results  Component Value Date   IRON 62 06/02/2023   TIBC 298.2 06/02/2023   FERRITIN 220.2 06/02/2023    Imaging and Procedures obtained prior to SNF admission: CT HEAD WO CONTRAST ( )  Result Date: 05/18/2023 CLINICAL DATA:  Delirium. EXAM: CT HEAD WITHOUT CONTRAST TECHNIQUE: Contiguous axial images were obtained from the base of the skull through the vertex without intravenous contrast. RADIATION DOSE REDUCTION: This exam was performed according to the departmental dose-optimization program which includes automated exposure control, adjustment of the mA and/or kV according to patient size and/or use of iterative reconstruction technique. COMPARISON:  CT head without contrast 04/29/2023 FINDINGS: Brain: Mild atrophy and white matter changes are similar the prior study. No acute infarct, hemorrhage, or mass lesion is present. Deep brain nuclei are within normal limits. The ventricles are of normal size. No significant extraaxial fluid collection is present. The brainstem and cerebellum are within normal limits. Midline structures  are within normal limits. Vascular: Atherosclerotic calcifications are present within the cavernous internal carotid arteries bilaterally. No hyperdense vessel is present. Skull: A left parietal scalp hematoma is present. Underlying fracture or foreign body is present. Sinuses/Orbits: The paranasal sinuses and mastoid air cells are clear. Right lens replacement is present. Globes and orbits are otherwise within normal limits. IMPRESSION: 1. Left parietal scalp hematoma without underlying fracture or foreign body. 2. Stable mild atrophy and white matter disease. This likely reflects the sequela of chronic microvascular ischemia. 3. No acute intracranial abnormality or significant interval change. Electronically Signed   By: Marin Roberts M.D.   On: 05/18/2023 17:45   CT ABDOMEN PELVIS W CONTRAST  Result Date: 05/17/2023 CLINICAL DATA:  82 year old female with abdominal pain, falls, bruising to the abdomen and back. EXAM: CT ABDOMEN AND PELVIS WITH CONTRAST TECHNIQUE: Multidetector CT imaging of the abdomen and pelvis was performed using the standard protocol following bolus administration of intravenous contrast. RADIATION DOSE REDUCTION: This exam was performed according to the departmental dose-optimization program which includes automated exposure control, adjustment of the mA and/or kV according to patient size and/or use of iterative reconstruction technique. CONTRAST:  OMNIPAQUE IOHEXOL 300 MG/ML  SOLN COMPARISON:  CT Abdomen and Pelvis 04/23/2023. FINDINGS: Lower chest: Partially visible cardiomegaly appears stable. No pericardial or pleural  effusion. Partially visible cardiac pacer lead. Hepatobiliary: Numerous chronic hepatic cysts have not significantly changed since 2017 and are benign (no follow-up imaging recommended). Diminutive, negative gallbladder. Pancreas: Within normal limits. Spleen: Negative. Adrenals/Urinary Tract: Normal adrenal glands. Nonobstructed kidneys. Occasional  chronic renal cysts also present in 2017 (no follow-up imaging recommended). Symmetric renal enhancement and contrast excretion with no hydronephrosis or hydroureter. But the urinary bladder is markedly distended today (sagittal image 63). Estimated bladder volume 920 mL. Stomach/Bowel: No dilated large or small bowel. Retained stool throughout the colon. Normal appendix on series 2, image 43. Decompressed stomach. No free air or free fluid. Vascular/Lymphatic: Extensive Aortoiliac calcified atherosclerosis. Major arterial structures remain patent. No lymphadenopathy. Early portal venous timing on the initial phase, on the delayed images the main portal venous structures appear to be patent. Reproductive: Negative. Other: No pelvic free fluid. Musculoskeletal: Moderate to severe dextroconvex lumbar scoliosis and widespread lumbar, lower thoracic spine degeneration. Chronic lower lumbar spondylolisthesis. Chronic right posterior rib fractures appear stable. But there is a non healed transverse fracture of the sacrum through the S3 level (sagittal images 56 through 61) which is now minimally displaced, but likely subacute. Superimposed bilateral sacral ala insufficiency fractures with sclerosis, stable. SI joints remain intact. Chronic fractures of the right pubic rami, bordering the anterior right acetabulum again noted. Previous left femur ORIF. IMPRESSION: 1. A transverse fracture through the Sacrum at S3 is mildly displaced, but appears subacute, and is superimposed on multiple chronic sacral and pelvic fractures. Query associated S3 to S5 sacral radiculopathy. 2. Marked urinary bladder distension (920 mL). Query urinary retention. 3. No other acute or inflammatory process identified in the abdomen or pelvis. Aortic Atherosclerosis (ICD10-I70.0). Electronically Signed   By: Odessa Fleming M.D.   On: 05/17/2023 13:04    Assessment/Plan Moderate dementia with psychotic disturbance, unspecified dementia type  (HCC)  Hypotension, unspecified hypotension type  Chronic kidney disease (CKD) stage G3a/A2, moderately decreased glomerular filtration rate (GFR) between 45-59 mL/min/1.73 square meter and albuminuria creatinine ratio between 30-299 mg/g (HCC)  Chronic systolic congestive heart failure (HCC)  Atrial fibrillation, unspecified type (HCC)  Protein deficiency (HCC)  Frequent falls Patient with hx of dementia moved to higher level of care due to increased needs. Patient continues under hospice care as she continues meeting criteria at this time. Continued low blood pressures despite midodrine, increase dose of midodrine to 5 mg TID w/ meals, continue PRN 2.5 mg dosing daily for BP less than 100/60. Encourage use of compression stockings for lower extremity edema. Elevation as tolerated. Encourage regular diet. BMP. Encourage use of call bell for assistance given numerous falls. PT/OT given admission to SNF.   Family/ staff Communication: nursing  Labs/tests ordered: BMP

## 2023-10-22 NOTE — Progress Notes (Signed)
Provider:  Coralyn Helling, M.D. Location:  Other [facility]) Nursing Home Room Number: 204 [id number] Place of Service:  SNF (31)  PCP: Earnestine Mealing, MD Patient Care Team: Earnestine Mealing, MD as PCP - General (Family Medicine) Marinus Maw, MD as PCP - Electrophysiology (Cardiology) Antonieta Iba, MD as PCP - Cardiology (Cardiology) Lemar Livings, Merrily Pew, MD (General Surgery) Sherlene Shams, MD (Internal Medicine) Sherlene Shams, MD (Internal Medicine) Toney Reil, MD as Consulting Physician (Gastroenterology)  Extended Emergency Contact Information Primary Emergency Contact: Dotzler,brett Address: [address]          [place], [place] [address] Darden [place] of [employer] Phone: [phone] Relation: Son  Code Status: DNR Goals of Care: Advanced Directive information    10/22/2023    8:56 AM  Advanced Directives  Does Patient Have a Medical Advance Directive? Yes  Type of Estate agent of [place];Out of facility DNR (pink MOST or yellow form);Living will  Does patient want to make changes to medical advance directive? No - Patient declined  Copy of Healthcare Power of Attorney in Chart? Yes - validated most recent copy scanned in chart (See row information)  Pre-existing out of facility DNR order (yellow form or pink MOST form) Yellow form placed in chart (order not valid for inpatient use)      Chief Complaint  Patient presents with   New Admit To SNF    New admission  to [facility]     HPI: Patient is a 82 y.o. female seen today for admission to  Past Medical History:  Diagnosis Date   Acute on chronic diastolic (congestive) heart failure (HCC)    Allergy    See list   Altered mental status 04/23/2023   Anemia 2018   Anxiety    Occasionally take Xanax for sleep   Anxiety associated with depression    Prn alprazolam    Anxiety associated with depression    Arthritis    Hands, Back   Atrial fibrillation,  persistent (HCC)    DCCV 08/22/2015   Bradycardia post-op bradycardia, pacer dependent   MDT PPM 11/06/15, Dr. Ladona Ridgel   Cataract    Left eye   Chronic kidney disease (CKD) stage G3a/A2, moderately decreased glomerular filtration rate (GFR) between 45-59 mL/min/1.73 square meter and albuminuria creatinine ratio between 30-299 mg/g (HCC) 11/13/2021   Closed bilateral fracture of pubic rami (HCC) 08/28/2022   Diverticulosis    Focal seizures (HCC)    Heart murmur    Hematuria, gross 04/09/2018   Hemorrhoid    Hepatic cyst    innumerable   History of asbestos exposure    Hyperlipidemia    IBS (irritable bowel syndrome)    Idiopathic thrombocytopenic purpura (ITP) (HCC)    Migraine    MVP (mitral valve prolapse)    Near syncope 01/26/2023   Nodule of right lung    Osteoporosis of forearm    RBBB    Restrictive lung disease    Mild on PFT & likely cardiac in etiology    Right sided sciatica 04/10/2022   S/P Minimally invasive maze operation for atrial fibrillation 10/31/2015   Complete bilateral atrial lesion set using cryothermy and bipolar radiofrequency ablation with clipping of LA appendage via right mini thoracotomy approach   S/P minimally invasive mitral valve replacement with bioprosthetic valve 10/31/2015   33 mm [facility] Mitral bovine bioprosthetic tissue valve placed via right mini thoracotomy approach   Seizures (HCC)    left  foot paralysis and left hand paralysis Dr. 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   SVT (supraventricular tachycardia) (HCC)    Thoracic aorta atherosclerosis (HCC)    TIA (transient ischemic attack)    Visit for preventive health examination 05/23/2016   Past Surgical History:  Procedure Laterality Date   BREAST EXCISIONAL BIOPSY Right Late 80s   Negative X2   CARDIAC CATHETERIZATION N/A 10/18/2015   Procedure: Right/Left Heart Cath and Coronary Angiography;  Surgeon: Tonny Bollman, MD;   Location: [facility] INVASIVE CV LAB;  Service: Cardiovascular;  Laterality: N/A;   CARDIOVERSION N/A 08/22/2015   Procedure: CARDIOVERSION;  Surgeon: Vesta Mixer, MD;  Location: [facility] ENDOSCOPY;  Service: Cardiovascular;  Laterality: N/A;   COLONOSCOPY  2003   EP IMPLANTABLE DEVICE N/A 11/06/2015   Procedure: Pacemaker Implant;  Surgeon: Marinus Maw, MD;  Location: [facility] INVASIVE CV LAB;  Service: Cardiovascular;  Laterality: N/A;   EP IMPLANTABLE DEVICE N/A 02/20/2016   Procedure: Lead Extraction;  Surgeon: Marinus Maw, MD;  Location: [facility] INVASIVE CV LAB;  Service: Cardiovascular;  Laterality: N/A;   ESOPHAGOGASTRODUODENOSCOPY (EGD) WITH PROPOFOL N/A 05/19/2023   Procedure: ESOPHAGOGASTRODUODENOSCOPY (EGD) WITH PROPOFOL;  Surgeon: Midge Minium, MD;  Location: [facility] ENDOSCOPY;  Service: Endoscopy;  Laterality: N/A;   EYE SURGERY Right 2013   FEMUR IM NAIL Left 06/18/2021   Procedure: INTRAMEDULLARY (IM) NAIL FEMORAL, OPEN REDUCTION INTERNAL FIXATION FEMUR RIGHT LITTLE FINGER PIP CLOSED REDUCTION;  Surgeon: Myrene Galas, MD;  Location: [facility] OR;  Service: Orthopedics;  Laterality: Left;   FOOT SURGERY     ~2007 right foot bunion   MANDIBLE FRACTURE SURGERY  03/26/2013   MINIMALLY INVASIVE MAZE PROCEDURE N/A 10/31/2015   Procedure: MINIMALLY INVASIVE MAZE PROCEDURE;  Surgeon: Purcell Nails, MD;  Location: [facility] OR;  Service: Open Heart Surgery;  Laterality: N/A;   MITRAL VALVE REPLACEMENT Right 10/31/2015   Procedure: MINIMALLY INVASIVE MITRAL VALVE (MV) REPLACEMENT;  Surgeon: Purcell Nails, MD;  Location: [facility] OR;  Service: Open Heart Surgery;  Laterality: Right;   PACEMAKER LEAD REMOVAL  02/20/2016   TEE WITH CARDIOVERSION     TEE WITHOUT CARDIOVERSION N/A 08/22/2015   Procedure: TRANSESOPHAGEAL ECHOCARDIOGRAM (TEE);  Surgeon: Vesta Mixer, MD;  Location: [facility] ENDOSCOPY;  Service: Cardiovascular;  Laterality: N/A;   TEE WITHOUT CARDIOVERSION N/A 10/31/2015   Procedure: TRANSESOPHAGEAL  ECHOCARDIOGRAM (TEE);  Surgeon: Purcell Nails, MD;  Location: [facility] OR;  Service: Open Heart Surgery;  Laterality: N/A;   TUBAL LIGATION     VARICOSE VEIN SURGERY Right     reports that she has never smoked. She has never used smokeless tobacco. She reports that she does not drink alcohol and does not use drugs. Social History   Socioeconomic History   Marital status: Widowed    Spouse name: Deceased   Number of children: 2   Years of education: 11   Highest education level: Master's degree (e.g., MA, MS, MEng, MEd, MSW, MBA)  Occupational History   Occupation: Retired  Tobacco Use   Smoking status: Never   Smokeless tobacco: Never  Vaping Use   Vaping status: Never Used  Substance and Sexual Activity   Alcohol use: No    Alcohol/week: 3.0 standard drinks of alcohol    Types: 3 Standard drinks or equivalent per week   Drug use: No   Sexual activity: Not Currently  Other Topics Concern   Not on file  Social History Narrative   ** Merged History Encounter **       She is a widow.  Husband died from metastatic renal cell cancer. Originally from [place]. Previously lived in [place] from 1975-[place]. Moved to [place] in 2007. No mold exposure recently but did have it through a prior work exposure in 1993. Has a masters in    public health. No bird exposure.  [facility] (09/29/17): She has moved into a retirement community since last appointment. She reports she had testing at her new residence that was positive for mold. It has since been treated.    Social Determinants of Health   Financial Resource Strain: Low Risk  (03/17/2023)   Overall Financial Resource Strain (CARDIA)    Difficulty of Paying Living Expenses: Not hard at all  Food Insecurity: No Food Insecurity (05/17/2023)   Hunger Vital Sign    Worried About Running Out of Food in the Last Year: Never true    Ran Out of Food in the Last Year: Never true  Transportation Needs: No Transportation Needs (05/17/2023)    PRAPARE - Administrator, Civil Service (Medical): No    Lack of Transportation (Non-Medical): No  Physical Activity: Unknown (03/17/2023)   Exercise Vital Sign    Days of Exercise per Week: Patient declined    Minutes of Exercise per Session: Not on file  Stress: No Stress Concern Present (03/17/2023)   [employer] of Occupational Health - Occupational Stress Questionnaire    Feeling of Stress : Only a little  Social Connections: Unknown (03/17/2023)   Social Connection and Isolation Panel [NHANES]    Frequency of Communication with Friends and Family: Patient declined    Frequency of Social Gatherings with Friends and Family: Once a week    Attends Religious Services: Patient declined    Database administrator or Organizations: Yes    Attends Banker Meetings: Patient declined    Marital Status: Widowed  Intimate Partner Violence: Not At Risk (05/17/2023)   Humiliation, Afraid, Rape, and Kick questionnaire    Fear of Current or Ex-Partner: No    Emotionally Abused: No    Physically Abused: No    Sexually Abused: No    Functional Status Survey:    Family History  Problem Relation Age of Onset   Hypertension Mother    Arrhythmia Mother    Heart failure Mother    Arrhythmia Brother    Stroke Brother 54       cerebral hemorrhage, nonsmoker, no HTN   Prostate cancer Brother    Stroke Father        from an aneurysm   Stroke Maternal Aunt 83       cerebral hemorrhage   Liver cancer Maternal Grandmother    Atrial fibrillation Son    Celiac disease Son    Heart attack Neg Hx    Breast cancer Neg Hx     Health Maintenance  Topic Date Due   DTaP/Tdap/Td (2 - Td or Tdap) 06/25/2022   INFLUENZA VACCINE  07/03/2023   COVID-19 Vaccine (6 - 2023-24 season) 08/03/2023   Pneumonia Vaccine 79+ Years old  Completed   DEXA SCAN  Completed   Zoster Vaccines- Shingrix  Completed   HPV VACCINES  Aged Out   Hepatitis C Screening  Discontinued     Allergies  Allergen Reactions   Codeine Nausea And Vomiting and Other (See Comments)    migraine   Epinephrine Palpitations and Shortness Of  Breath   Hydrocodone Nausea And Vomiting and Other (See Comments)    MIGRAINE   Hydromorphone Nausea And Vomiting and Other (See Comments)    migraine   Molds & Smuts Anxiety, Other (See Comments) and Shortness Of Breath   Oxycodone Nausea And Vomiting and Other (See Comments)    Severe migraine   Meloxicam Other (See Comments)    Severe reflux   Codeine Other (See Comments)    Migraine   Dextromethorphan Other (See Comments)    seizures   Diphen [Diphenhydramine Hcl] Other (See Comments)    seizure   Diphenhydramine Other (See Comments)    seizure   Diphenhydramine Hcl     Other reaction(s): Other (See Comments)   Diphenylpyraline Other (See Comments)   Doxycycline Itching, Swelling and Other (See Comments)    Facial   Doxycycline Itching and Swelling   Hydrocodone Other (See Comments)    Migraine   Hydromorphone Other (See Comments)    Migraine   Meloxicam Other (See Comments)   Mobic [Meloxicam] Other (See Comments)    reflux   Nsaids    Nsaids Other (See Comments)    Pt on blood thinner   Oxycodone Other (See Comments)    Migraine   Propofol Other (See Comments)   Propofol Other (See Comments)    Very sensitive; patient stated she was told she was told she had apnea    Outpatient Encounter Medications as of 10/22/2023  Medication Sig   acetaminophen (TYLENOL) 325 MG tablet Take 650 mg by mouth every 4 (four) hours as needed for mild pain (pain score 1-3) or moderate pain (pain score 4-6). Give two tablets by mouth every 4 hours as needed.   albuterol (PROVENTIL) (2.5 MG/3ML) 0.083% nebulizer solution Take 3 mLs (2.5 mg total) by nebulization every 6 (six) hours as needed for wheezing or shortness of breath.   ARIPiprazole (ABILIFY) 10 MG tablet Take 10 mg by mouth every evening.   bismuth subsalicylate (PEPTO BISMOL)  262 MG/15ML suspension Take 30 mLs by mouth every 6 (six) hours as needed.   fluticasone (FLONASE) 50 MCG/ACT nasal spray Place 2 sprays into both nostrils daily.   furosemide (LASIX) 20 MG tablet Take 20 mg by mouth daily at 12 noon. X 3 days in the afternoon for edema   furosemide (LASIX) 40 MG tablet Take 40 mg by mouth daily.   gabapentin (NEURONTIN) 100 MG capsule Take 200 mg by mouth 3 (three) times daily.   Iron, Ferrous Sulfate, 325 (65 Fe) MG TABS Take 1 tablet by mouth daily.   lamoTRIgine (LAMICTAL) 150 MG tablet Take 150 mg by mouth daily.   lamoTRIgine (LAMICTAL) 200 MG tablet Take 200 mg by mouth at bedtime.   meclizine (ANTIVERT) 25 MG tablet Take 12.5 mg by mouth every 12 (twelve) hours as needed for dizziness.   metoprolol tartrate (LOPRESSOR) 25 MG tablet Take 25 mg by mouth daily.   midodrine (PROAMATINE) 2.5 MG tablet Take 5 mg by mouth 3 (three) times daily with meals. Give one tablet by mouth daily as needed for low BP   montelukast (SINGULAIR) 10 MG tablet Take 1 tablet (10 mg total) by mouth at bedtime.   OLANZapine (ZYPREXA) 5 MG tablet Take 5 mg by mouth at bedtime.   ondansetron (ZOFRAN) 8 MG tablet Take 8 mg by mouth every 8 (eight) hours as needed for nausea or vomiting.   pantoprazole (PROTONIX) 40 MG tablet Take 1 tablet (40 mg total) by mouth 2 (two)  times daily before a meal.   polyethylene glycol (MIRALAX / GLYCOLAX) 17 g packet Take 17 g by mouth daily as needed for mild constipation or moderate constipation.   potassium chloride (KLOR-CON) 20 MEQ packet Take 20 mEq by mouth daily.   rosuvastatin (CRESTOR) 40 MG tablet Take 1 tablet (40 mg total) by mouth daily.   VENTOLIN HFA 108 (90 Base) MCG/ACT inhaler INHALE 2 PUFFS INTO THE LUNGS EVERY 6 HOURS AS NEEDED FOR WHEEZING OR SHORTNESS OF BREATH   No facility-administered encounter medications on file as of 10/22/2023.    Review of Systems  Vitals:   10/22/23 0853  BP: (!) 93/57  Pulse: 69  Resp: 17   Temp: (!) 97.2 F (36.2 C)  SpO2: 95%  Weight: 145 lb 12.8 oz (66.1 kg)  Height: 5\' 5"  (1.651 m)   Body mass index is 24.26 kg/m. Physical Exam  Labs reviewed: Basic Metabolic Panel: Recent Labs    04/29/23 1500 05/17/23 1201 05/19/23 0410 05/20/23 0330 06/02/23 1201 06/26/23 0930 08/12/23 0000 10/01/23 1444 10/13/23 0000  NA 141   < > 137   < > 141 142 139 144 142  K 3.2*   < > 3.5   < > 3.9 3.6 4.0 3.7 3.6  CL 102   < > 103   < > 102 101 100 103 100  CO2 28   < > 25   < > 28 33* 32* 26 35*  GLUCOSE 99   < > 95   < > 86 93  --  139*  --   BUN 29*   < > 19   < > 29* 34* 29* 27 25*  CREATININE 1.51*   < > 0.96   < > 1.26* 1.51* 1.7* 1.49* 1.5*  CALCIUM 9.4   < > 8.7*   < > 10.1 10.5 9.3 9.6 9.8  MG 2.1  --  2.2  --   --  2.3  --   --   --    < > = values in this interval not displayed.   Liver Function Tests: Recent Labs    04/29/23 1501 05/17/23 1201 05/26/23 0000 07/01/23 1436 10/01/23 1444  AST 33 47* 26  --  33  ALT 24 26 15   --  16  ALKPHOS 109 123 129*  --  178*  BILITOT 0.9 1.5*  --   --  0.6  PROT 6.1* 6.8  --   --  5.9*  ALBUMIN 3.7 4.3  --  4.3 4.2   Recent Labs    04/29/23 1501  LIPASE 84*   Recent Labs    04/29/23 2118  AMMONIA 22   CBC: Recent Labs    04/17/23 0529 04/19/23 1331 04/23/23 0127 04/29/23 1500 05/19/23 0410 05/20/23 0330 05/26/23 0000 06/02/23 1201  WBC 3.4*   < > 4.3   < > 4.9 4.1  --  4.6  NEUTROABS 2.2  --  3.1  --   --   --   --  3.1  HGB 9.0*   < > 9.8*   < > 8.3* 7.7* 9.4* 8.9 Repeated and verified X2.*  HCT 28.6*   < > 31.2*   < > 27.0* 24.7*  --  27.3*  MCV 95.0   < > 95.1   < > 95.4 95.4  --  92.3  PLT 91*   < > 107*   < > 111* 109* 201 143.0*   < > = values in this  interval not displayed.   Cardiac Enzymes: Recent Labs    01/26/23 1019  CKTOTAL 115   BNP: Invalid input(s): "POCBNP" Lab Results  Component Value Date   HGBA1C 5.5 02/11/2020   Lab Results  Component Value Date   TSH 4.842 (H)  04/29/2023   Lab Results  Component Value Date   VITAMINB12 3,392 (H) 04/23/2023   Lab Results  Component Value Date   FOLATE 25.0 05/18/2023   Lab Results  Component Value Date   IRON 62 06/02/2023   TIBC 298.2 06/02/2023   FERRITIN 220.2 06/02/2023    Imaging and Procedures obtained prior to SNF admission: CT HEAD WO CONTRAST ( )  Result Date: 05/18/2023 CLINICAL DATA:  Delirium. EXAM: CT HEAD WITHOUT CONTRAST TECHNIQUE: Contiguous axial images were obtained from the base of the skull through the vertex without intravenous contrast. RADIATION DOSE REDUCTION: This exam was performed according to the departmental dose-optimization program which includes automated exposure control, adjustment of the mA and/or kV according to patient size and/or use of iterative reconstruction technique. COMPARISON:  CT head without contrast 04/29/2023 FINDINGS: Brain: Mild atrophy and white matter changes are similar the prior study. No acute infarct, hemorrhage, or mass lesion is present. Deep brain nuclei are within normal limits. The ventricles are of normal size. No significant extraaxial fluid collection is present. The brainstem and cerebellum are within normal limits. Midline structures are within normal limits. Vascular: Atherosclerotic calcifications are present within the cavernous internal carotid arteries bilaterally. No hyperdense vessel is present. Skull: A left parietal scalp hematoma is present. Underlying fracture or foreign body is present. Sinuses/Orbits: The paranasal sinuses and mastoid air cells are clear. Right lens replacement is present. Globes and orbits are otherwise within normal limits. IMPRESSION: 1. Left parietal scalp hematoma without underlying fracture or foreign body. 2. Stable mild atrophy and white matter disease. This likely reflects the sequela of chronic microvascular ischemia. 3. No acute intracranial abnormality or significant interval change. Electronically Signed   By:  Marin Roberts M.D.   On: 05/18/2023 17:45   CT ABDOMEN PELVIS W CONTRAST  Result Date: 05/17/2023 CLINICAL DATA:  82 year old female with abdominal pain, falls, bruising to the abdomen and back. EXAM: CT ABDOMEN AND PELVIS WITH CONTRAST TECHNIQUE: Multidetector CT imaging of the abdomen and pelvis was performed using the standard protocol following bolus administration of intravenous contrast. RADIATION DOSE REDUCTION: This exam was performed according to the departmental dose-optimization program which includes automated exposure control, adjustment of the mA and/or kV according to patient size and/or use of iterative reconstruction technique. CONTRAST:  OMNIPAQUE IOHEXOL 300 MG/ML  SOLN COMPARISON:  CT Abdomen and Pelvis 04/23/2023. FINDINGS: Lower chest: Partially visible cardiomegaly appears stable. No pericardial or pleural effusion. Partially visible cardiac pacer lead. Hepatobiliary: Numerous chronic hepatic cysts have not significantly changed since 2017 and are benign (no follow-up imaging recommended). Diminutive, negative gallbladder. Pancreas: Within normal limits. Spleen: Negative. Adrenals/Urinary Tract: Normal adrenal glands. Nonobstructed kidneys. Occasional chronic renal cysts also present in 2017 (no follow-up imaging recommended). Symmetric renal enhancement and contrast excretion with no hydronephrosis or hydroureter. But the urinary bladder is markedly distended today (sagittal image 63). Estimated bladder volume 920 mL. Stomach/Bowel: No dilated large or small bowel. Retained stool throughout the colon. Normal appendix on series 2, image 43. Decompressed stomach. No free air or free fluid. Vascular/Lymphatic: Extensive Aortoiliac calcified atherosclerosis. Major arterial structures remain patent. No lymphadenopathy. Early portal venous timing on the initial phase, on the delayed images the main portal  venous structures appear to be patent. Reproductive: Negative. Other: No  pelvic free fluid. Musculoskeletal: Moderate to severe dextroconvex lumbar scoliosis and widespread lumbar, lower thoracic spine degeneration. Chronic lower lumbar spondylolisthesis. Chronic right posterior rib fractures appear stable. But there is a non healed transverse fracture of the sacrum through the S3 level (sagittal images 56 through 61) which is now minimally displaced, but likely subacute. Superimposed bilateral sacral ala insufficiency fractures with sclerosis, stable. SI joints remain intact. Chronic fractures of the right pubic rami, bordering the anterior right acetabulum again noted. Previous left femur ORIF. IMPRESSION: 1. A transverse fracture through the Sacrum at S3 is mildly displaced, but appears subacute, and is superimposed on multiple chronic sacral and pelvic fractures. Query associated S3 to S5 sacral radiculopathy. 2. Marked urinary bladder distension (920 mL). Query urinary retention. 3. No other acute or inflammatory process identified in the abdomen or pelvis. Aortic Atherosclerosis (ICD10-I70.0). Electronically Signed   By: Odessa Fleming M.D.   On: 05/17/2023 13:04    Assessment/Plan There are no diagnoses linked to this encounter.   Family/ staff Communication:   Labs/tests ordered:

## 2023-10-25 ENCOUNTER — Telehealth: Payer: Self-pay | Admitting: Orthopedic Surgery

## 2023-10-25 LAB — BASIC METABOLIC PANEL
BUN: 25 — AB (ref 4–21)
CO2: 31 — AB (ref 13–22)
Chloride: 99 (ref 99–108)
Creatinine: 1.4 — AB (ref 0.5–1.1)
Glucose: 104
Potassium: 3.7 meq/L (ref 3.5–5.1)
Sodium: 139 (ref 137–147)

## 2023-10-25 LAB — COMPREHENSIVE METABOLIC PANEL
Calcium: 9.6 (ref 8.7–10.7)
eGFR: 36

## 2023-10-25 NOTE — Telephone Encounter (Signed)
[facility] nursing calls to report increased confusion and urinary frequency this morning. H/o dementia with psychosis. Orders to UA/culture and bmp given. Advised to encourage hydration with water as well.

## 2023-10-25 NOTE — Telephone Encounter (Signed)
[facility] nursing calls to report fall. She c/o left hip pain after event. She also has hematoma to left eye. She was able to get back into bed after event. No internal/external rotation to left leg per nursing. Stat xray left hip and pelvis. She has tylenol for pain. Orders for UA/culture given earlier. She is refusing to give sample at this time. Advised to encourage oral fluids.

## 2023-10-26 ENCOUNTER — Encounter: Payer: Self-pay | Admitting: Student

## 2023-10-27 ENCOUNTER — Other Ambulatory Visit: Payer: Self-pay | Admitting: Physician Assistant

## 2023-10-27 ENCOUNTER — Ambulatory Visit
Admission: RE | Admit: 2023-10-27 | Discharge: 2023-10-27 | Disposition: A | Discharge status: Home / Self Care | Payer: Medicare Other | Source: Ambulatory Visit | Attending: Physician Assistant

## 2023-10-27 ENCOUNTER — Non-Acute Institutional Stay (SKILLED_NURSING_FACILITY): Payer: Medicare Other | Admitting: Student

## 2023-10-27 DIAGNOSIS — J690 Pneumonitis due to inhalation of food and vomit: Secondary | ICD-10-CM | POA: Diagnosis not present

## 2023-10-27 DIAGNOSIS — A419 Sepsis, unspecified organism: Secondary | ICD-10-CM | POA: Diagnosis not present

## 2023-10-27 DIAGNOSIS — M84750S Atypical femoral fracture, unspecified, sequela: Secondary | ICD-10-CM | POA: Diagnosis not present

## 2023-10-27 DIAGNOSIS — M25552 Pain in left hip: Secondary | ICD-10-CM

## 2023-10-27 DIAGNOSIS — R296 Repeated falls: Secondary | ICD-10-CM

## 2023-10-28 ENCOUNTER — Emergency Department: Payer: Medicare Other

## 2023-10-28 ENCOUNTER — Other Ambulatory Visit: Payer: Self-pay

## 2023-10-28 ENCOUNTER — Encounter: Payer: Self-pay | Admitting: Student

## 2023-10-28 ENCOUNTER — Inpatient Hospital Stay
Admission: EM | Admit: 2023-10-28 | Discharge: 2023-11-03 | DRG: 871 | Disposition: A | Discharge status: Hospice | Payer: Medicare Other | Attending: Internal Medicine | Admitting: Internal Medicine

## 2023-10-28 DIAGNOSIS — R1310 Dysphagia, unspecified: Secondary | ICD-10-CM | POA: Diagnosis present

## 2023-10-28 DIAGNOSIS — I340 Nonrheumatic mitral (valve) insufficiency: Secondary | ICD-10-CM | POA: Diagnosis present

## 2023-10-28 DIAGNOSIS — G40909 Epilepsy, unspecified, not intractable, without status epilepticus: Secondary | ICD-10-CM | POA: Diagnosis present

## 2023-10-28 DIAGNOSIS — I5032 Chronic diastolic (congestive) heart failure: Secondary | ICD-10-CM | POA: Diagnosis present

## 2023-10-28 DIAGNOSIS — Z8673 Personal history of transient ischemic attack (TIA), and cerebral infarction without residual deficits: Secondary | ICD-10-CM

## 2023-10-28 DIAGNOSIS — Z6824 Body mass index (BMI) 24.0-24.9, adult: Secondary | ICD-10-CM

## 2023-10-28 DIAGNOSIS — R296 Repeated falls: Secondary | ICD-10-CM | POA: Diagnosis present

## 2023-10-28 DIAGNOSIS — I951 Orthostatic hypotension: Secondary | ICD-10-CM | POA: Diagnosis present

## 2023-10-28 DIAGNOSIS — E876 Hypokalemia: Secondary | ICD-10-CM | POA: Diagnosis present

## 2023-10-28 DIAGNOSIS — I2510 Atherosclerotic heart disease of native coronary artery without angina pectoris: Secondary | ICD-10-CM | POA: Diagnosis present

## 2023-10-28 DIAGNOSIS — Z8249 Family history of ischemic heart disease and other diseases of the circulatory system: Secondary | ICD-10-CM

## 2023-10-28 DIAGNOSIS — F03918 Unspecified dementia, unspecified severity, with other behavioral disturbance: Secondary | ICD-10-CM

## 2023-10-28 DIAGNOSIS — I341 Nonrheumatic mitral (valve) prolapse: Secondary | ICD-10-CM | POA: Diagnosis present

## 2023-10-28 DIAGNOSIS — J690 Pneumonitis due to inhalation of food and vomit: Secondary | ICD-10-CM | POA: Diagnosis present

## 2023-10-28 DIAGNOSIS — Z66 Do not resuscitate: Secondary | ICD-10-CM | POA: Diagnosis present

## 2023-10-28 DIAGNOSIS — K922 Gastrointestinal hemorrhage, unspecified: Secondary | ICD-10-CM | POA: Diagnosis present

## 2023-10-28 DIAGNOSIS — Z515 Encounter for palliative care: Secondary | ICD-10-CM

## 2023-10-28 DIAGNOSIS — G839 Paralytic syndrome, unspecified: Secondary | ICD-10-CM | POA: Diagnosis present

## 2023-10-28 DIAGNOSIS — R0902 Hypoxemia: Secondary | ICD-10-CM | POA: Diagnosis present

## 2023-10-28 DIAGNOSIS — Z77090 Contact with and (suspected) exposure to asbestos: Secondary | ICD-10-CM | POA: Diagnosis present

## 2023-10-28 DIAGNOSIS — Z952 Presence of prosthetic heart valve: Secondary | ICD-10-CM

## 2023-10-28 DIAGNOSIS — Z87440 Personal history of urinary (tract) infections: Secondary | ICD-10-CM

## 2023-10-28 DIAGNOSIS — G9341 Metabolic encephalopathy: Secondary | ICD-10-CM | POA: Diagnosis present

## 2023-10-28 DIAGNOSIS — I4819 Other persistent atrial fibrillation: Secondary | ICD-10-CM | POA: Diagnosis present

## 2023-10-28 DIAGNOSIS — Z85828 Personal history of other malignant neoplasm of skin: Secondary | ICD-10-CM

## 2023-10-28 DIAGNOSIS — E785 Hyperlipidemia, unspecified: Secondary | ICD-10-CM | POA: Diagnosis present

## 2023-10-28 DIAGNOSIS — Z881 Allergy status to other antibiotic agents status: Secondary | ICD-10-CM

## 2023-10-28 DIAGNOSIS — M810 Age-related osteoporosis without current pathological fracture: Secondary | ICD-10-CM | POA: Diagnosis present

## 2023-10-28 DIAGNOSIS — G9340 Encephalopathy, unspecified: Secondary | ICD-10-CM | POA: Diagnosis not present

## 2023-10-28 DIAGNOSIS — A419 Sepsis, unspecified organism: Principal | ICD-10-CM | POA: Diagnosis present

## 2023-10-28 DIAGNOSIS — Z823 Family history of stroke: Secondary | ICD-10-CM

## 2023-10-28 DIAGNOSIS — J440 Chronic obstructive pulmonary disease with acute lower respiratory infection: Secondary | ICD-10-CM | POA: Diagnosis present

## 2023-10-28 DIAGNOSIS — W19XXXA Unspecified fall, initial encounter: Secondary | ICD-10-CM | POA: Diagnosis present

## 2023-10-28 DIAGNOSIS — N1832 Chronic kidney disease, stage 3b: Secondary | ICD-10-CM | POA: Diagnosis present

## 2023-10-28 DIAGNOSIS — Z79899 Other long term (current) drug therapy: Secondary | ICD-10-CM

## 2023-10-28 DIAGNOSIS — K589 Irritable bowel syndrome without diarrhea: Secondary | ICD-10-CM | POA: Diagnosis present

## 2023-10-28 DIAGNOSIS — Z885 Allergy status to narcotic agent status: Secondary | ICD-10-CM

## 2023-10-28 DIAGNOSIS — Y92009 Unspecified place in unspecified non-institutional (private) residence as the place of occurrence of the external cause: Secondary | ICD-10-CM | POA: Diagnosis not present

## 2023-10-28 DIAGNOSIS — R627 Adult failure to thrive: Secondary | ICD-10-CM | POA: Diagnosis present

## 2023-10-28 DIAGNOSIS — Z886 Allergy status to analgesic agent status: Secondary | ICD-10-CM

## 2023-10-28 DIAGNOSIS — Z91048 Other nonmedicinal substance allergy status: Secondary | ICD-10-CM

## 2023-10-28 DIAGNOSIS — Z9851 Tubal ligation status: Secondary | ICD-10-CM

## 2023-10-28 LAB — BASIC METABOLIC PANEL
Anion gap: 13 (ref 5–15)
BUN: 28 mg/dL — ABNORMAL HIGH (ref 8–23)
CO2: 27 mmol/L (ref 22–32)
Calcium: 9.6 mg/dL (ref 8.9–10.3)
Chloride: 98 mmol/L (ref 98–111)
Creatinine, Ser: 1.67 mg/dL — ABNORMAL HIGH (ref 0.44–1.00)
GFR, Estimated: 30 mL/min — ABNORMAL LOW (ref >60)
Glucose, Bld: 118 mg/dL — ABNORMAL HIGH (ref 70–99)
Potassium: 3.4 mmol/L — ABNORMAL LOW (ref 3.5–5.1)
Sodium: 138 mmol/L (ref 135–145)

## 2023-10-28 LAB — FOLATE: Folate: 16.4 ng/mL (ref >5.9)

## 2023-10-28 LAB — CBC
HCT: 30 % — ABNORMAL LOW (ref 36.0–46.0)
Hemoglobin: 9.7 g/dL — ABNORMAL LOW (ref 12.0–15.0)
MCH: 30.8 pg (ref 26.0–34.0)
MCHC: 32.3 g/dL (ref 30.0–36.0)
MCV: 95.2 fL (ref 80.0–100.0)
Platelets: 114 10*3/uL — ABNORMAL LOW (ref 150–400)
RBC: 3.15 MIL/uL — ABNORMAL LOW (ref 3.87–5.11)
RDW: 17.2 % — ABNORMAL HIGH (ref 11.5–15.5)
WBC: 12.9 10*3/uL — ABNORMAL HIGH (ref 4.0–10.5)
nRBC: 0 % (ref 0.0–0.2)

## 2023-10-28 LAB — BLOOD GAS, VENOUS
Acid-Base Excess: 0.4 mmol/L (ref 0.0–2.0)
Bicarbonate: 27.7 mmol/L (ref 20.0–28.0)
O2 Saturation: 61.9 %
Patient temperature: 37
pCO2, Ven: 55 mm[Hg] (ref 44–60)
pH, Ven: 7.31 (ref 7.25–7.43)
pO2, Ven: 40 mm[Hg] (ref 32–45)

## 2023-10-28 LAB — IRON AND TIBC
Iron: 34 ug/dL (ref 28–170)
Saturation Ratios: 10 % — ABNORMAL LOW (ref 10.4–31.8)
TIBC: 336 ug/dL (ref 250–450)
UIBC: 302 ug/dL

## 2023-10-28 LAB — URINALYSIS, ROUTINE W REFLEX MICROSCOPIC
Bacteria, UA: NONE SEEN
Bilirubin Urine: NEGATIVE
Glucose, UA: NEGATIVE mg/dL
Ketones, ur: NEGATIVE mg/dL
Leukocytes,Ua: NEGATIVE
Nitrite: NEGATIVE
Protein, ur: 100 mg/dL — AB
Specific Gravity, Urine: 1.014 (ref 1.005–1.030)
pH: 6 (ref 5.0–8.0)

## 2023-10-28 LAB — FERRITIN: Ferritin: 149 ng/mL (ref 11–307)

## 2023-10-28 LAB — SARS CORONAVIRUS 2 BY RT PCR: SARS Coronavirus 2 by RT PCR: NEGATIVE

## 2023-10-28 LAB — RETICULOCYTES
Immature Retic Fract: 14.6 % (ref 2.3–15.9)
RBC.: 3.15 MIL/uL — ABNORMAL LOW (ref 3.87–5.11)
Retic Count, Absolute: 61.7 10*3/uL (ref 19.0–186.0)
Retic Ct Pct: 2 % (ref 0.4–3.1)

## 2023-10-28 LAB — LACTIC ACID, PLASMA: Lactic Acid, Venous: 1 mmol/L (ref 0.5–1.9)

## 2023-10-28 MED ORDER — SODIUM CHLORIDE 0.9 % IV BOLUS
1000.0000 mL | Freq: Once | INTRAVENOUS | Status: AC
  Administered 2023-10-28: 1000 mL via INTRAVENOUS

## 2023-10-28 MED ORDER — MECLIZINE HCL 25 MG PO TABS: 12.5000 mg | ORAL_TABLET | Freq: Two times a day (BID) | ORAL | Status: DC | PRN

## 2023-10-28 MED ORDER — LAMOTRIGINE 100 MG PO TABS
150.0000 mg | ORAL_TABLET | Freq: Every day | ORAL | Status: DC
  Administered 2023-10-29 – 2023-11-03 (×4): 150 mg via ORAL
  Filled 2023-10-28 (×6): qty 2

## 2023-10-28 MED ORDER — GABAPENTIN 100 MG PO CAPS: 100.0000 mg | ORAL_CAPSULE | Freq: Two times a day (BID) | ORAL | Status: DC

## 2023-10-28 MED ORDER — SODIUM CHLORIDE 0.9 % IV SOLN
3.0000 g | Freq: Two times a day (BID) | INTRAVENOUS | Status: AC
  Administered 2023-10-28 – 2023-11-01 (×9): 3 g via INTRAVENOUS
  Filled 2023-10-28 (×9): qty 8

## 2023-10-28 MED ORDER — SODIUM CHLORIDE 0.9 % IV SOLN
2.0000 g | Freq: Once | INTRAVENOUS | Status: AC
  Administered 2023-10-28: 2 g via INTRAVENOUS
  Filled 2023-10-28: qty 20

## 2023-10-28 MED ORDER — POTASSIUM CHLORIDE 10 MEQ/100ML IV SOLN
10.0000 meq | INTRAVENOUS | Status: AC
  Administered 2023-10-28 (×2): 10 meq via INTRAVENOUS
  Filled 2023-10-28 (×2): qty 100

## 2023-10-28 MED ORDER — GUAIFENESIN ER 600 MG PO TB12
600.0000 mg | ORAL_TABLET | Freq: Two times a day (BID) | ORAL | Status: DC
  Administered 2023-10-29 – 2023-11-02 (×4): 600 mg via ORAL
  Administered 2023-11-02: 300 mg via ORAL
  Administered 2023-11-03: 600 mg via ORAL
  Filled 2023-10-28 (×7): qty 1

## 2023-10-28 MED ORDER — FLUTICASONE PROPIONATE 50 MCG/ACT NA SUSP
2.0000 | Freq: Every day | NASAL | Status: DC
  Administered 2023-10-30 – 2023-11-03 (×3): 2 via NASAL
  Filled 2023-10-28: qty 16

## 2023-10-28 MED ORDER — ONDANSETRON HCL 4 MG/2ML IJ SOLN: 4.0000 mg | Freq: Four times a day (QID) | INTRAMUSCULAR | Status: DC | PRN

## 2023-10-28 MED ORDER — SODIUM CHLORIDE 0.9 % IV SOLN
500.0000 mg | Freq: Once | INTRAVENOUS | Status: DC
  Filled 2023-10-28: qty 5

## 2023-10-28 MED ORDER — ACETAMINOPHEN 650 MG RE SUPP: 650.0000 mg | Freq: Four times a day (QID) | RECTAL | Status: DC | PRN

## 2023-10-28 MED ORDER — PANTOPRAZOLE SODIUM 40 MG IV SOLR
40.0000 mg | Freq: Every day | INTRAVENOUS | Status: DC
  Administered 2023-10-28 – 2023-10-29 (×2): 40 mg via INTRAVENOUS
  Filled 2023-10-28 (×2): qty 10

## 2023-10-28 MED ORDER — IPRATROPIUM-ALBUTEROL 0.5-2.5 (3) MG/3ML IN SOLN
3.0000 mL | Freq: Four times a day (QID) | RESPIRATORY_TRACT | Status: DC
  Administered 2023-10-29 (×3): 3 mL via RESPIRATORY_TRACT
  Filled 2023-10-28 (×4): qty 3

## 2023-10-28 MED ORDER — LAMOTRIGINE 100 MG PO TABS
200.0000 mg | ORAL_TABLET | Freq: Every day | ORAL | Status: DC
  Administered 2023-10-29 – 2023-11-02 (×4): 200 mg via ORAL
  Filled 2023-10-28 (×4): qty 2

## 2023-10-28 MED ORDER — ALBUTEROL SULFATE (2.5 MG/3ML) 0.083% IN NEBU
2.5000 mg | INHALATION_SOLUTION | Freq: Four times a day (QID) | RESPIRATORY_TRACT | Status: DC | PRN
  Administered 2023-10-30: 2.5 mg via RESPIRATORY_TRACT
  Filled 2023-10-28: qty 3

## 2023-10-28 MED ORDER — OLANZAPINE 10 MG IM SOLR
2.5000 mg | Freq: Four times a day (QID) | INTRAMUSCULAR | Status: DC | PRN
  Administered 2023-10-29 (×2): 2.5 mg via INTRAMUSCULAR
  Filled 2023-10-28 (×3): qty 10

## 2023-10-28 MED ORDER — GABAPENTIN 100 MG PO CAPS
200.0000 mg | ORAL_CAPSULE | Freq: Three times a day (TID) | ORAL | Status: DC
  Administered 2023-10-29 – 2023-11-03 (×7): 200 mg via ORAL
  Filled 2023-10-28 (×9): qty 2

## 2023-10-28 MED ORDER — ACETAMINOPHEN 325 MG PO TABS: 650.0000 mg | ORAL_TABLET | Freq: Four times a day (QID) | ORAL | Status: DC | PRN

## 2023-10-28 MED ORDER — ARIPIPRAZOLE 10 MG PO TABS
10.0000 mg | ORAL_TABLET | Freq: Every evening | ORAL | Status: DC
  Administered 2023-10-29 – 2023-11-02 (×3): 10 mg via ORAL
  Filled 2023-10-28 (×6): qty 1

## 2023-10-28 MED ORDER — HEPARIN SODIUM (PORCINE) 5000 UNIT/ML IJ SOLN
5000.0000 [IU] | Freq: Two times a day (BID) | INTRAMUSCULAR | Status: DC
  Administered 2023-10-28 – 2023-11-03 (×10): 5000 [IU] via SUBCUTANEOUS
  Filled 2023-10-28 (×12): qty 1

## 2023-10-28 MED ORDER — SODIUM CHLORIDE 0.9 % IV SOLN: INTRAVENOUS | Status: DC

## 2023-10-28 MED ORDER — MIDODRINE HCL 5 MG PO TABS: 5.0000 mg | ORAL_TABLET | Freq: Three times a day (TID) | ORAL | Status: DC

## 2023-10-28 MED ORDER — ONDANSETRON HCL 4 MG PO TABS: 4.0000 mg | ORAL_TABLET | Freq: Four times a day (QID) | ORAL | Status: DC | PRN

## 2023-10-28 MED ORDER — OLANZAPINE 5 MG PO TABS
5.0000 mg | ORAL_TABLET | Freq: Every day | ORAL | Status: DC
  Administered 2023-10-29 – 2023-10-30 (×2): 5 mg via ORAL
  Filled 2023-10-28 (×2): qty 1

## 2023-10-28 MED ORDER — MIDODRINE HCL 5 MG PO TABS
2.5000 mg | ORAL_TABLET | Freq: Three times a day (TID) | ORAL | Status: DC
  Administered 2023-10-29 – 2023-11-03 (×9): 2.5 mg via ORAL
  Filled 2023-10-28 (×10): qty 1

## 2023-10-28 NOTE — ED Triage Notes (Signed)
Pt to ED [facility] from [facility] for fall on Saturday. Hit head. Bruising noted to right forehead. Reports another fall this am, denies hitting head, no thinners or LOC. Hx dementia.  Reports has been having behavioral changes since fall Saturday

## 2023-10-28 NOTE — H&P (Addendum)
History and Physical    [name] [name]:[id number] DOB: 1941-10-17 DOA: 10/28/2023  PCP: Earnestine Mealing, MD (Confirm with patient/family/NH records and if not entered, this has to be entered at [facility] point of entry) Patient coming from: SNF  I have personally briefly reviewed patient's old medical records in [facility]  Chief Complaint: Frequent falls, feeling weak  HPI: [name] [name] is a 82 y.o. female with medical history significant of dementia, PAF off Eliquis secondary to frequent falls and major GI bleed, advanced dementia, chronic dysphagia, seizure disorder, CKD stage III, orthostatic hypotension on midodrine, anxiety/depression sent from nursing home for evaluation of frequent falls, decreased oral intake and altered mentations.  Patient is lethargic and somewhat confused unable to provide any history, or history provided by son at bedside.  Son last saw patient 2 weeks ago and found patient appears to have drooling and coughing after eating or drinking, which is new.  And son was told that the patient has had significant decrease of oral intake probably due to fear of aspiration as well.  No cough noticed by son.  And patient sustained 2 falls in last 3 days.  1 episode was the weekend and patient fell on her hip and complained of pain but x-ray showed no fracture or dislocation.  Today patient fell again and hit her head.  No LOC.  Recently patient has been worked up with speech therapist at the facility for dysphagia who recommended a formal barium swallow x-ray, which is yet to be scheduled.  ED Course: Blood pressure borderline low which responded to IV fluid afebrile, none hypoxia.  Chest CT showed scattered infiltrates and consolidation bilaterally peripheral compatible with multifocal pneumonia and implying chronic aspiration.  Blood work showed WBC 17.9, K3.4.  Patient was started on ceftriaxone and azithromycin.  Review of Systems: Unable to perform, patient is  lethargic and confused  Past Medical History:  Diagnosis Date   Acute on chronic diastolic (congestive) heart failure (HCC)    Allergy    See list   Altered mental status 04/23/2023   Anemia 2018   Anxiety    Occasionally take Xanax for sleep   Anxiety associated with depression    Prn alprazolam    Anxiety associated with depression    Arthritis    Hands, Back   Atrial fibrillation, persistent (HCC)    DCCV 08/22/2015   Bradycardia post-op bradycardia, pacer dependent   MDT PPM 11/06/15, Dr. Ladona Ridgel   Cataract    Left eye   Chronic kidney disease (CKD) stage G3a/A2, moderately decreased glomerular filtration rate (GFR) between 45-59 mL/min/1.73 square meter and albuminuria creatinine ratio between 30-299 mg/g (HCC) 11/13/2021   Closed bilateral fracture of pubic rami (HCC) 08/28/2022   Diverticulosis    Focal seizures (HCC)    Heart murmur    Hematuria, gross 04/09/2018   Hemorrhoid    Hepatic cyst    innumerable   History of asbestos exposure    Hyperlipidemia    IBS (irritable bowel syndrome)    Idiopathic thrombocytopenic purpura (ITP) (HCC)    Migraine    MVP (mitral valve prolapse)    Near syncope 01/26/2023   Nodule of right lung    Osteoporosis of forearm    RBBB    Restrictive lung disease    Mild on PFT & likely cardiac in etiology    Right sided sciatica 04/10/2022   S/P Minimally invasive maze operation for atrial fibrillation 10/31/2015   Complete bilateral atrial lesion  set using cryothermy and bipolar radiofrequency ablation with clipping of LA appendage via right mini thoracotomy approach   S/P minimally invasive mitral valve replacement with bioprosthetic valve 10/31/2015   33 mm [facility] Mitral bovine bioprosthetic tissue valve placed via right mini thoracotomy approach   Seizures (HCC)    left foot paralysis and left hand paralysis Dr. 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   SVT  (supraventricular tachycardia) (HCC)    Thoracic aorta atherosclerosis (HCC)    TIA (transient ischemic attack)    Visit for preventive health examination 05/23/2016    Past Surgical History:  Procedure Laterality Date   BREAST EXCISIONAL BIOPSY Right Late 80s   Negative X2   CARDIAC CATHETERIZATION N/A 10/18/2015   Procedure: Right/Left Heart Cath and Coronary Angiography;  Surgeon: Tonny Bollman, MD;  Location: [facility] INVASIVE CV LAB;  Service: Cardiovascular;  Laterality: N/A;   CARDIOVERSION N/A 08/22/2015   Procedure: CARDIOVERSION;  Surgeon: Vesta Mixer, MD;  Location: [facility] ENDOSCOPY;  Service: Cardiovascular;  Laterality: N/A;   COLONOSCOPY  2003   EP IMPLANTABLE DEVICE N/A 11/06/2015   Procedure: Pacemaker Implant;  Surgeon: Marinus Maw, MD;  Location: [facility] INVASIVE CV LAB;  Service: Cardiovascular;  Laterality: N/A;   EP IMPLANTABLE DEVICE N/A 02/20/2016   Procedure: Lead Extraction;  Surgeon: Marinus Maw, MD;  Location: [facility] INVASIVE CV LAB;  Service: Cardiovascular;  Laterality: N/A;   ESOPHAGOGASTRODUODENOSCOPY (EGD) WITH PROPOFOL N/A 05/19/2023   Procedure: ESOPHAGOGASTRODUODENOSCOPY (EGD) WITH PROPOFOL;  Surgeon: Midge Minium, MD;  Location: [facility] ENDOSCOPY;  Service: Endoscopy;  Laterality: N/A;   EYE SURGERY Right 2013   FEMUR IM NAIL Left 06/18/2021   Procedure: INTRAMEDULLARY (IM) NAIL FEMORAL, OPEN REDUCTION INTERNAL FIXATION FEMUR RIGHT LITTLE FINGER PIP CLOSED REDUCTION;  Surgeon: Myrene Galas, MD;  Location: [facility] OR;  Service: Orthopedics;  Laterality: Left;   FOOT SURGERY     ~2007 right foot bunion   MANDIBLE FRACTURE SURGERY  03/26/2013   MINIMALLY INVASIVE MAZE PROCEDURE N/A 10/31/2015   Procedure: MINIMALLY INVASIVE MAZE PROCEDURE;  Surgeon: Purcell Nails, MD;  Location: [facility] OR;  Service: Open Heart Surgery;  Laterality: N/A;   MITRAL VALVE REPLACEMENT Right 10/31/2015   Procedure: MINIMALLY INVASIVE MITRAL VALVE (MV) REPLACEMENT;  Surgeon: Purcell Nails,  MD;  Location: [facility] OR;  Service: Open Heart Surgery;  Laterality: Right;   PACEMAKER LEAD REMOVAL  02/20/2016   TEE WITH CARDIOVERSION     TEE WITHOUT CARDIOVERSION N/A 08/22/2015   Procedure: TRANSESOPHAGEAL ECHOCARDIOGRAM (TEE);  Surgeon: Vesta Mixer, MD;  Location: [facility] ENDOSCOPY;  Service: Cardiovascular;  Laterality: N/A;   TEE WITHOUT CARDIOVERSION N/A 10/31/2015   Procedure: TRANSESOPHAGEAL ECHOCARDIOGRAM (TEE);  Surgeon: Purcell Nails, MD;  Location: [facility] OR;  Service: Open Heart Surgery;  Laterality: N/A;   TUBAL LIGATION     VARICOSE VEIN SURGERY Right      reports that she has never smoked. She has never used smokeless tobacco. She reports that she does not drink alcohol and does not use drugs.  Allergies  Allergen Reactions   Codeine Nausea And Vomiting and Other (See Comments)    migraine   Epinephrine Palpitations and Shortness Of Breath   Hydrocodone Nausea And Vomiting and Other (See Comments)    MIGRAINE   Hydromorphone Nausea And Vomiting and Other (See Comments)    migraine   Molds & Smuts Anxiety, Other (See Comments) and Shortness Of  Breath   Oxycodone Nausea And Vomiting and Other (See Comments)    Severe migraine   Meloxicam Other (See Comments)    Severe reflux   Codeine Other (See Comments)    Migraine   Dextromethorphan Other (See Comments)    seizures   Diphen [Diphenhydramine Hcl] Other (See Comments)    seizure   Diphenhydramine Other (See Comments)    seizure   Diphenhydramine Hcl     Other reaction(s): Other (See Comments)   Diphenylpyraline Other (See Comments)   Doxycycline Itching, Swelling and Other (See Comments)    Facial   Doxycycline Itching and Swelling   Hydrocodone Other (See Comments)    Migraine   Hydromorphone Other (See Comments)    Migraine   Meloxicam Other (See Comments)   Mobic [Meloxicam] Other (See Comments)    reflux   Nsaids    Nsaids Other (See Comments)    Pt on blood thinner   Oxycodone Other (See Comments)     Migraine   Propofol Other (See Comments)   Propofol Other (See Comments)    Very sensitive; patient stated she was told she was told she had apnea    Family History  Problem Relation Age of Onset   Hypertension Mother    Arrhythmia Mother    Heart failure Mother    Arrhythmia Brother    Stroke Brother 36       cerebral hemorrhage, nonsmoker, no HTN   Prostate cancer Brother    Stroke Father        from an aneurysm   Stroke Maternal Aunt 83       cerebral hemorrhage   Liver cancer Maternal Grandmother    Atrial fibrillation Son    Celiac disease Son    Heart attack Neg Hx    Breast cancer Neg Hx     Prior to Admission medications   Medication Sig Start Date End Date Taking? Authorizing Provider  acetaminophen (TYLENOL) 325 MG tablet Take 650 mg by mouth every 4 (four) hours as needed for mild pain (pain score 1-3) or moderate pain (pain score 4-6). Give two tablets by mouth every 4 hours as needed.    [provider]  albuterol (PROVENTIL) (2.5 MG/3ML) 0.083% nebulizer solution Take 3 mLs (2.5 mg total) by nebulization every 6 (six) hours as needed for wheezing or shortness of breath. 07/18/23   Earnestine Mealing, MD  ARIPiprazole (ABILIFY) 10 MG tablet Take 10 mg by mouth every evening.    [provider]  bismuth subsalicylate (PEPTO BISMOL) 262 MG/15ML suspension Take 30 mLs by mouth every 6 (six) hours as needed.    [provider]  fluticasone (FLONASE) 50 MCG/ACT nasal spray Place 2 sprays into both nostrils daily. 06/13/23   [provider]  furosemide (LASIX) 40 MG tablet Take 40 mg by mouth daily.    [provider]  gabapentin (NEURONTIN) 100 MG capsule Take 200 mg by mouth 3 (three) times daily. 05/07/23   [provider]  Iron, Ferrous Sulfate, 325 (65 Fe) MG TABS Take 1 tablet by mouth daily.    [provider]  lamoTRIgine (LAMICTAL) 150 MG tablet Take 150 mg by mouth daily.    [provider]   lamoTRIgine (LAMICTAL) 200 MG tablet Take 200 mg by mouth at bedtime.    [provider]  meclizine (ANTIVERT) 25 MG tablet Take 12.5 mg by mouth every 12 (twelve) hours as needed for dizziness.    [provider]  metoprolol tartrate (LOPRESSOR) 25 MG tablet Take 25 mg by mouth daily.    [provider]  midodrine (PROAMATINE) 2.5 MG tablet Take 5 mg by mouth 3 (three) times daily with meals. Give one tablet by mouth daily as needed for low BP    [provider]  montelukast (SINGULAIR) 10 MG tablet Take 1 tablet (10 mg total) by mouth at bedtime. 06/26/23   Sherlene Shams, MD  OLANZapine (ZYPREXA) 5 MG tablet Take 5 mg by mouth at bedtime.    [provider]  ondansetron (ZOFRAN) 8 MG tablet Take 8 mg by mouth every 8 (eight) hours as needed for nausea or vomiting.    [provider]  pantoprazole (PROTONIX) 40 MG tablet Take 1 tablet (40 mg total) by mouth 2 (two) times daily before a meal. 05/20/23   Delfino Lovett, MD  polyethylene glycol (MIRALAX / GLYCOLAX) 17 g packet Take 17 g by mouth daily as needed for mild constipation or moderate constipation.    [provider]  potassium chloride (KLOR-CON) 20 MEQ packet Take 20 mEq by mouth daily.    [provider]  rosuvastatin (CRESTOR) 40 MG tablet Take 1 tablet (40 mg total) by mouth daily. 02/03/23   Sherlene Shams, MD  VENTOLIN HFA 108 (90 Base) MCG/ACT inhaler INHALE 2 PUFFS INTO THE LUNGS EVERY 6 HOURS AS NEEDED FOR WHEEZING OR SHORTNESS OF BREATH 10/07/22   Sherlene Shams, MD    Physical Exam: Vitals:   10/28/23 1332 10/28/23 1445 10/28/23 1530 10/28/23 1600  BP: (!) 99/59 (!) 78/49 120/75 114/84  Pulse: 70 69 69 69  Resp: 18 14  20   Temp:      SpO2: 94% 93% 100% 100%  Weight:      Height:        Constitutional: NAD, calm, comfortable Vitals:   10/28/23 1332 10/28/23 1445 10/28/23 1530 10/28/23 1600  BP: (!) 99/59 (!) 78/49 120/75 114/84  Pulse: 70 69 69 69   Resp: 18 14  20   Temp:      SpO2: 94% 93% 100% 100%  Weight:      Height:       Eyes: PERRL, lids and conjunctivae normal ENMT: Mucous membranes are moist. Posterior pharynx clear of any exudate or lesions.Normal dentition.  Neck: normal, supple, no masses, no thyromegaly Respiratory: clear to auscultation bilaterally, no wheezing, scattered crackle bilaterally. Normal respiratory effort. No accessory muscle use.  Cardiovascular: Regular rate and rhythm, no murmurs / rubs / gallops. No extremity edema. 2+ pedal pulses. No carotid bruits.  Abdomen: no tenderness, no masses palpated. No hepatosplenomegaly. Bowel sounds positive.  Musculoskeletal: no clubbing / cyanosis. No joint deformity upper and lower extremities. Good ROM, no contractures. Normal muscle tone.  Skin: no rashes, lesions, ulcers. No induration Neurologic: No facial droops, moving all limbs, following simple commands Psychiatric: Lethargic, easily arousable, confused   Labs on Admission: I have personally reviewed following labs and imaging studies  CBC: Recent Labs  Lab 10/28/23 1036  WBC 12.9*  HGB 9.7*  HCT 30.0*  MCV 95.2  PLT 114*   Basic Metabolic Panel: Recent Labs  Lab 10/28/23 1036  NA 138  K 3.4*  CL 98  CO2 27  GLUCOSE 118*  BUN 28*  CREATININE 1.67*  CALCIUM 9.6   GFR: Estimated Creatinine Clearance: 23 mL/min (A) (by C-G formula based on SCr of 1.67 mg/dL (H)). Liver Function Tests: No results for input(s): "AST", "ALT", "ALKPHOS", "BILITOT", "PROT", "ALBUMIN"  in the last 168 hours. No results for input(s): "LIPASE", "AMYLASE" in the last 168 hours. No results for input(s): "AMMONIA" in the last 168 hours. Coagulation Profile: No results for input(s): "INR", "PROTIME" in the last 168 hours. Cardiac Enzymes: No results for input(s): "CKTOTAL", "CKMB", "CKMBINDEX", "TROPONINI" in the last 168 hours. BNP (last 3 results) No results for input(s): "PROBNP" in the last 8760  hours. HbA1C: No results for input(s): "HGBA1C" in the last 72 hours. CBG: No results for input(s): "GLUCAP" in the last 168 hours. Lipid Profile: No results for input(s): "CHOL", "HDL", "LDLCALC", "TRIG", "CHOLHDL", "LDLDIRECT" in the last 72 hours. Thyroid Function Tests: No results for input(s): "TSH", "T4TOTAL", "FREET4", "T3FREE", "THYROIDAB" in the last 72 hours. Anemia Panel: No results for input(s): "VITAMINB12", "FOLATE", "FERRITIN", "TIBC", "IRON", "RETICCTPCT" in the last 72 hours. Urine analysis:    Component Value Date/Time   COLORURINE AMBER (A) 05/17/2023 1353   APPEARANCEUR CLOUDY (A) 05/17/2023 1353   LABSPEC 1.019 05/17/2023 1353   PHURINE 6.0 05/17/2023 1353   GLUCOSEU NEGATIVE 05/17/2023 1353   GLUCOSEU NEGATIVE 04/07/2018 1647   HGBUR MODERATE (A) 05/17/2023 1353   BILIRUBINUR NEGATIVE 05/17/2023 1353   KETONESUR NEGATIVE 05/17/2023 1353   PROTEINUR 30 (A) 05/17/2023 1353   UROBILINOGEN 0.2 04/07/2018 1647   NITRITE NEGATIVE 05/17/2023 1353   LEUKOCYTESUR SMALL (A) 05/17/2023 1353    Radiological Exams on Admission: DG Chest 2 View  Result Date: 10/28/2023 CLINICAL DATA:  Shortness of breath. EXAM: CHEST - 2 VIEW COMPARISON:  04/29/2023. FINDINGS: Redemonstration of heterogeneous nonspecific opacities throughout bilateral lungs with right upper/mid lung zone predominance, essentially similar/slightly less conspicuous to the prior study. Findings may represent combination of atelectasis/scarring with superimposed alveolar opacities. Bilateral lungs are otherwise clear. There is mild pulmonary vascular congestion. Bilateral costophrenic angles are clear. Stable moderately enlarged cardio-mediastinal silhouette. There is a left sided 2-lead pacemaker. Left atrial appendage closure device and prosthetic mitral valve again seen. No acute osseous abnormalities. The soft tissues are within normal limits. IMPRESSION: *Mild pulmonary vascular congestion. *Redemonstration  of heterogeneous nonspecific opacities throughout bilateral lungs with right upper/mid lung zone predominance, essentially similar/slightly less conspicuous to the prior study. Findings may represent combination of atelectasis/scarring with superimposed alveolar opacities which may be due to edema versus pneumonitis. Correlate clinically to determine the need for additional imaging with chest CT scan. Electronically Signed   By: Jules Schick M.D.   On: 10/28/2023 14:52   CT HEAD WO CONTRAST ( )  Result Date: 10/28/2023 CLINICAL DATA:  Provided history: Neck trauma. Head trauma, minor. Additional history provided: Fall (with head trauma). Right forehead ecchymosis. EXAM: CT HEAD WITHOUT CONTRAST CT CERVICAL SPINE WITHOUT CONTRAST TECHNIQUE: Multidetector CT imaging of the head and cervical spine was performed following the standard protocol without intravenous contrast. Multiplanar CT image reconstructions of the cervical spine were also generated. RADIATION DOSE REDUCTION: This exam was performed according to the departmental dose-optimization program which includes automated exposure control, adjustment of the mA and/or kV according to patient size and/or use of iterative reconstruction technique. COMPARISON:  Head CT 05/18/2023.  Cervical spine CT 04/17/2023. FINDINGS: CT HEAD FINDINGS Brain: Mild generalized parenchymal atrophy. Patchy and ill-defined hypoattenuation within the cerebral white matter, nonspecific but compatible with mild chronic small vessel ischemic disease. There is no acute intracranial hemorrhage. No demarcated cortical infarct. No extra-axial fluid collection. No evidence of an intracranial mass. No midline shift. Vascular: No hyperdense vessel. Atherosclerotic calcifications. Skull: No calvarial fracture or aggressive osseous lesion. Sinuses/Orbits: No  acute orbital finding. Prior right ocular lens replacement. Small-volume frothy secretions within the left sphenoid sinus. Other:  Forehead hematoma. Trace fluid within the right mastoid air cells. CT CERVICAL SPINE FINDINGS Mildly motion degraded exam. Within this limitation, findings are as follows. Alignment: Slight C7-T1 and T1-T2 grade 1 anterolisthesis. Dextrocurvature of the cervical spine. Levocurvature of the upper thoracic spine, partially imaged. Skull base and vertebrae: The basion-dental and atlanto-dental intervals are maintained.No evidence of acute fracture to the cervical spine. Soft tissues and spinal canal: Subcentimeter thyroid nodules not meeting consensus criteria for ultrasound follow-up based on size. No follow-up imaging recommended. Reference: J Am Coll Radiol. 2015 Feb;12(2): 143-50. Disc levels: Cervical spondylosis. No more than mild disc space narrowing. Shallow multilevel disc bulges/central disc protrusions. Multilevel uncovertebral hypertrophy and facet arthrosis. No appreciable high-grade spinal canal stenosis. No significant bony neural foraminal narrowing. Upper chest: No consolidation within the imaged lung apices. No visible pneumothorax. Biapical pleuroparenchymal scarring. Other: Chronic fracture deformity of the distal left clavicle. IMPRESSION: CT head: 1.  No evidence of an acute intracranial abnormality. 2. Forehead hematoma. 3. Mild parenchymal atrophy and chronic small vessel ischemic disease. 4. Mild left sphenoid sinusitis. 5. Trace right mastoid effusion. CT cervical spine: 1. Mildly motion degraded exam. 2. No evidence of an acute cervical spine fracture. 3. Mild grade 1 anterolisthesis at C7-T1 and T1-T2. 4. Dextrocurvature of the cervical spine and partially imaged levocurvature of the upper thoracic spine. 5. Cervical spondylosis as described. Electronically Signed   By: Jackey Loge D.O.   On: 10/28/2023 12:56   CT Cervical Spine Wo Contrast  Result Date: 10/28/2023 CLINICAL DATA:  Provided history: Neck trauma. Head trauma, minor. Additional history provided: Fall (with head trauma).  Right forehead ecchymosis. EXAM: CT HEAD WITHOUT CONTRAST CT CERVICAL SPINE WITHOUT CONTRAST TECHNIQUE: Multidetector CT imaging of the head and cervical spine was performed following the standard protocol without intravenous contrast. Multiplanar CT image reconstructions of the cervical spine were also generated. RADIATION DOSE REDUCTION: This exam was performed according to the departmental dose-optimization program which includes automated exposure control, adjustment of the mA and/or kV according to patient size and/or use of iterative reconstruction technique. COMPARISON:  Head CT 05/18/2023.  Cervical spine CT 04/17/2023. FINDINGS: CT HEAD FINDINGS Brain: Mild generalized parenchymal atrophy. Patchy and ill-defined hypoattenuation within the cerebral white matter, nonspecific but compatible with mild chronic small vessel ischemic disease. There is no acute intracranial hemorrhage. No demarcated cortical infarct. No extra-axial fluid collection. No evidence of an intracranial mass. No midline shift. Vascular: No hyperdense vessel. Atherosclerotic calcifications. Skull: No calvarial fracture or aggressive osseous lesion. Sinuses/Orbits: No acute orbital finding. Prior right ocular lens replacement. Small-volume frothy secretions within the left sphenoid sinus. Other: Forehead hematoma. Trace fluid within the right mastoid air cells. CT CERVICAL SPINE FINDINGS Mildly motion degraded exam. Within this limitation, findings are as follows. Alignment: Slight C7-T1 and T1-T2 grade 1 anterolisthesis. Dextrocurvature of the cervical spine. Levocurvature of the upper thoracic spine, partially imaged. Skull base and vertebrae: The basion-dental and atlanto-dental intervals are maintained.No evidence of acute fracture to the cervical spine. Soft tissues and spinal canal: Subcentimeter thyroid nodules not meeting consensus criteria for ultrasound follow-up based on size. No follow-up imaging recommended. Reference: J Am  Coll Radiol. 2015 Feb;12(2): 143-50. Disc levels: Cervical spondylosis. No more than mild disc space narrowing. Shallow multilevel disc bulges/central disc protrusions. Multilevel uncovertebral hypertrophy and facet arthrosis. No appreciable high-grade spinal canal stenosis. No significant bony neural foraminal narrowing. Upper chest:  No consolidation within the imaged lung apices. No visible pneumothorax. Biapical pleuroparenchymal scarring. Other: Chronic fracture deformity of the distal left clavicle. IMPRESSION: CT head: 1.  No evidence of an acute intracranial abnormality. 2. Forehead hematoma. 3. Mild parenchymal atrophy and chronic small vessel ischemic disease. 4. Mild left sphenoid sinusitis. 5. Trace right mastoid effusion. CT cervical spine: 1. Mildly motion degraded exam. 2. No evidence of an acute cervical spine fracture. 3. Mild grade 1 anterolisthesis at C7-T1 and T1-T2. 4. Dextrocurvature of the cervical spine and partially imaged levocurvature of the upper thoracic spine. 5. Cervical spondylosis as described. Electronically Signed   By: Jackey Loge D.O.   On: 10/28/2023 12:56   CT HIP LEFT WO CONTRAST  Result Date: 10/27/2023 CLINICAL DATA:  Left hip pain, fall 3 days ago, unable to bear weight. EXAM: CT OF THE LEFT HIP WITHOUT CONTRAST TECHNIQUE: Multidetector CT imaging of the left hip was performed according to the standard protocol. Multiplanar CT image reconstructions were also generated. RADIATION DOSE REDUCTION: This exam was performed according to the departmental dose-optimization program which includes automated exposure control, adjustment of the mA and/or kV according to patient size and/or use of iterative reconstruction technique. COMPARISON:  CT pelvis 05/17/2023 FINDINGS: Bones/Joint/Cartilage Substantially improved fusion of the prior displaced fracture of the right inferior pubic ramus. Moreover, the previous stress fracture the right pubic body is substantially improved. We  only partially include the right superior pubic ramus fracture which demonstrates at least partial nonunion. Substantially reduced conspicuity of the insufficiency fracture involving the sacral ala. There is still continued visibility of the transverse fracture in the S3 segment, as was shown on 05/17/2023. There is a left femoral intra-articular nail system in place with 2 cannulated appearing femoral head screws similar to that shown previously. We include the proximal 20 cm of the IM nail on today's examination, and this includes a lateral plate and screw fixator around a transverse fracture component in the proximal diaphysis. There is still faint persistence of the transverse fracture plane, although with notable woven bone in a fusiform configuration at the level of the fracture. The lateral plate and screw fixator has 2 proximal and 2 distal screws, the 2 proximal screws are fractured on image 59 of series 6. No new lucency around the visualized hardware. The only hardware complicating feature I observe is the screw fracture along the lateral plate and screw fixator noted above. No overt hip joint effusion. Ligaments Suboptimally assessed by CT. Muscles and Tendons Unremarkable Soft tissues Iliac atheromatous vascular calcification. Mild presacral edema. No sciatic notch impingement. No regional bursitis identified. IMPRESSION: 1. Substantially improved bony fusion of the prior displaced fracture of the right inferior pubic ramus. 2. Substantially improved appearance of the previous stress fracture of the right pubic body. 3. Substantially reduced conspicuity of the insufficiency fracture involving the sacral ala. There is still continued visibility of the transverse fracture in the S3 segment, as was shown on 05/17/2023. Left femoral intra-articular nail system in place with 2 cannulated appearing femoral head screws similar to that shown previously. 4. The previous transverse diaphyseal fracture of the  femur is still faintly visible although traversed by bridging fusiform woven bone; the lateral plate and screw fixator traversing this fracture laterally demonstrates interval fracture of the 2 screws proximal to the original fracture plane. 5. Iliac atheromatous vascular calcification. 6. Mild presacral edema. Electronically Signed   By: Gaylyn Rong M.D.   On: 10/27/2023 15:58    EKG: Independently reviewed. V  paced  Assessment/Plan Principal Problem:   Aspiration pneumonia (HCC) Active Problems:   Sepsis (HCC)  (please populate well all problems here in Problem List. (For example, if patient is on BP meds at home and you resume or decide to hold them, it is a problem that needs to be her. Same for CAD, COPD, HLD and so on)  Aspiration pneumonia -With her baseline chronic aspiration.  Will keep her n.p.o., and ordered speech evaluation. -Aspiration precaution, HOB 30 degree -Both x-ray and CT chest indicate acute on chronic aspiration pattern.  Switch antibiotics to Unasyn -DuoNebs -Incentive spirometry and flutter valve if able to use. -Plan to hold off most of her nonessential p.o. medications for tonight -According to send this is the second time patient has pneumonia this year alone.  Long-term prognosis likely is poor given the repeated pneumonia.  Family aware, and confirmed patient is DNR/DNI.  History of orthostatic hypotension -Doubt patient is septic as her blood pressure responded to IV fluid quickly. -Resume midodrine -Clinically patient still appeared to be volume depleted, plan to maintain her on IV fluid for now.  Hypokalemia -IV replacement.  PAF -Paced rhythm -On low-dose of metoprolol -Decision was made earlier this year to remove Eliquis due to frequent falls and a major GI bleed this year.  Advanced dementia -Have to hold off her routine pills until mentation improves -As needed Zyprexa for sundowning and agitation  CKD stage IIIb -Volume contracted,  creatinine level stable, on IV fluid  Fall -PT/OT tomorrow  DVT prophylaxis: Heparin subcu Code Status: DNR Family Communication: Daughter at bedside Disposition Plan: Patient is sick with aspiration pneumonia and deconditioning requiring IV antibiotics and IV fluid and inpatient speech evaluation.  Expect more than 2 midnight hospital stay. Consults called: None Admission status: Tele admit   Emeline General MD [facility] Pager [phone]  10/28/2023, 4:21 PM

## 2023-10-28 NOTE — ED Notes (Addendum)
Pt adjusted in bed. Pt closing fist and will not allow RN to open her hands. RN asking pt to open her eyes. Pt stating to RN "which one of them are you". RN told pt that she was the nurse. Pt refusing to open eyes and refusing to follow commands. Pt opened hands when RN stepped away from bed. RN attempted to look in eyes and pt closed eyes tightly. RN put pillow behind pt's head. Call light within reach. Stretcher locked in lowest position.

## 2023-10-28 NOTE — ED Notes (Signed)
Pt temperature 94.9 rectal. Bair hugger in place.

## 2023-10-28 NOTE — ED Notes (Addendum)
MD notified of 93.6 F rectal temp. RN asked pt what her normal temperature is. Pt states in clear speech that it is normally about 96. Pt went back to sleep.

## 2023-10-28 NOTE — ED Notes (Signed)
Pt straight cath by this RN and [place] EDT. Peri care performed. Call light within reach.

## 2023-10-28 NOTE — ED Notes (Signed)
Attempted to call [name] (son) to update on plan of care.

## 2023-10-28 NOTE — ED Notes (Signed)
MD made aware of holding PO midodrine due to pt NPO and awaiting SLP consult.

## 2023-10-28 NOTE — Consult Note (Signed)
Pharmacy Antibiotic Note  [name] [name] is a 82 y.o. female admitted on 10/28/2023 after multiple falls. Pharmacy has been consulted for Unasyn dosing for aspiration pneumonia.   Today, 10/28/2023  WBC 12.9 Afebrile  Scr 1.67 today with estimated CrCl 23 mL/min  Received ceftriaxone 2 gm IV x 1 in ED  Plan: Start Unasyn 3 gm IV Q12H based on current renal function  Pharmacy will continue to monitor and dose adjust appropriately   Height: 5\' 5"  (165.1 cm) Weight: 66 kg (145 lb 8.1 oz) IBW/kg (Calculated) : 57  Temp (24hrs), Avg:98.1 F (36.7 C), Min:98.1 F (36.7 C), Max:98.1 F (36.7 C)  Recent Labs  Lab 10/28/23 1036 10/28/23 1503  WBC 12.9*  --   CREATININE 1.67*  --   LATICACIDVEN  --  1.0    Estimated Creatinine Clearance: 23 mL/min (A) (by C-G formula based on SCr of 1.67 mg/dL (H)).    Allergies  Allergen Reactions   Codeine Nausea And Vomiting and Other (See Comments)    migraine   Epinephrine Palpitations and Shortness Of Breath   Hydrocodone Nausea And Vomiting and Other (See Comments)    MIGRAINE   Hydromorphone Nausea And Vomiting and Other (See Comments)    migraine   Molds & Smuts Anxiety, Other (See Comments) and Shortness Of Breath   Oxycodone Nausea And Vomiting and Other (See Comments)    Severe migraine   Meloxicam Other (See Comments)    Severe reflux   Codeine Other (See Comments)    Migraine   Dextromethorphan Other (See Comments)    seizures   Diphen [Diphenhydramine Hcl] Other (See Comments)    seizure   Diphenhydramine Other (See Comments)    seizure   Diphenhydramine Hcl     Other reaction(s): Other (See Comments)   Diphenylpyraline Other (See Comments)   Doxycycline Itching, Swelling and Other (See Comments)    Facial   Doxycycline Itching and Swelling   Hydrocodone Other (See Comments)    Migraine   Hydromorphone Other (See Comments)    Migraine   Meloxicam Other (See Comments)   Mobic [Meloxicam] Other (See Comments)     reflux   Nsaids    Nsaids Other (See Comments)    Pt on blood thinner   Oxycodone Other (See Comments)    Migraine   Propofol Other (See Comments)   Propofol Other (See Comments)    Very sensitive; patient stated she was told she was told she had apnea   Antimicrobials this admission: Ceftriaxone 11/26 x 1  Unasyn 11/26 >>   Dose adjustments this admission:  Microbiology results: 11/26 BCx: sent  Thank you for allowing pharmacy to be a part of this patient's care.  Littie Deeds, PharmD Pharmacy Resident  10/28/2023 4:33 PM

## 2023-10-28 NOTE — ED Notes (Signed)
Patient to CT at this time

## 2023-10-28 NOTE — ED Notes (Signed)
Erma Heritage, MD made aware of pt's BP of 78/49.

## 2023-10-28 NOTE — Progress Notes (Signed)
Location:  [facility] Room Number: 204 A Place of Service:  SNF (31) Provider:  Judeth Horn, MD  Patient Care Team: Earnestine Mealing, MD as PCP - General (Family Medicine) Marinus Maw, MD as PCP - Electrophysiology (Cardiology) Antonieta Iba, MD as PCP - Cardiology (Cardiology) Lemar Livings, Merrily Pew, MD (General Surgery) Sherlene Shams, MD (Internal Medicine) Sherlene Shams, MD (Internal Medicine) Toney Reil, MD as Consulting Physician (Gastroenterology)  Extended Emergency Contact Information Primary Emergency Contact: Housewright,brett Address: [address]          [place], [place] [address] [place] of [place] Phone: [phone] Relation: Son Secondary Emergency Contact: Herman,Cheryl Address: [address]          [place], [place] [address] [place] of [place] Mobile Phone: [phone] Relation: Daughter  Code Status:  DNR Goals of care: Advanced Directive information    10/28/2023   10:35 AM  Advanced Directives  Does Patient Have a Medical Advance Directive? Yes     Chief Complaint  Patient presents with   recurrent falls    HPI:  Pt is a 82 y.o. female seen today for an acute visit for Fall. Patient had an unwitnessed fall over the weekend. She has a new bruise on her forehead. X-ray showed some concern for stable hip, but broken nail present within fixated hip. Unclear of the age of this finding.  Patient denies pain at this time. She recently received tylenol. She says, "My main concern is that this will count against me. This is my second fall." She states she was standing reaching for the blinds to close them and reached too far and fell. She is referring to previous conversation where her primary goal is to progress to the point of assisted living. No LOC per nursing after fall.    Past Medical History:  Diagnosis Date   Acute on chronic diastolic (congestive) heart failure (HCC)    Allergy    See  list   Altered mental status 04/23/2023   Anemia 2018   Anxiety    Occasionally take Xanax for sleep   Anxiety associated with depression    Prn alprazolam    Anxiety associated with depression    Arthritis    Hands, Back   Atrial fibrillation, persistent (HCC)    DCCV 08/22/2015   Bradycardia post-op bradycardia, pacer dependent   MDT PPM 11/06/15, Dr. Ladona Ridgel   Cataract    Left eye   Chronic kidney disease (CKD) stage G3a/A2, moderately decreased glomerular filtration rate (GFR) between 45-59 mL/min/1.73 square meter and albuminuria creatinine ratio between 30-299 mg/g (HCC) 11/13/2021   Closed bilateral fracture of pubic rami (HCC) 08/28/2022   Diverticulosis    Focal seizures (HCC)    Heart murmur    Hematuria, gross 04/09/2018   Hemorrhoid    Hepatic cyst    innumerable   History of asbestos exposure    Hyperlipidemia    IBS (irritable bowel syndrome)    Idiopathic thrombocytopenic purpura (ITP) (HCC)    Migraine    MVP (mitral valve prolapse)    Near syncope 01/26/2023   Nodule of right lung    Osteoporosis of forearm    RBBB    Restrictive lung disease    Mild on PFT & likely cardiac in etiology    Right sided sciatica 04/10/2022   S/P Minimally invasive maze operation for atrial fibrillation 10/31/2015   Complete bilateral atrial lesion set using cryothermy and bipolar radiofrequency  ablation with clipping of LA appendage via right mini thoracotomy approach   S/P minimally invasive mitral valve replacement with bioprosthetic valve 10/31/2015   33 mm [facility] Mitral bovine bioprosthetic tissue valve placed via right mini thoracotomy approach   Seizures (HCC)    left foot paralysis and left hand paralysis Dr. 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   SVT (supraventricular tachycardia) (HCC)    Thoracic aorta atherosclerosis (HCC)    TIA (transient ischemic attack)    Visit for preventive health examination  05/23/2016   Past Surgical History:  Procedure Laterality Date   BREAST EXCISIONAL BIOPSY Right Late 80s   Negative X2   CARDIAC CATHETERIZATION N/A 10/18/2015   Procedure: Right/Left Heart Cath and Coronary Angiography;  Surgeon: Tonny Bollman, MD;  Location: [facility] INVASIVE CV LAB;  Service: Cardiovascular;  Laterality: N/A;   CARDIOVERSION N/A 08/22/2015   Procedure: CARDIOVERSION;  Surgeon: Vesta Mixer, MD;  Location: [facility] ENDOSCOPY;  Service: Cardiovascular;  Laterality: N/A;   COLONOSCOPY  2003   EP IMPLANTABLE DEVICE N/A 11/06/2015   Procedure: Pacemaker Implant;  Surgeon: Marinus Maw, MD;  Location: [facility] INVASIVE CV LAB;  Service: Cardiovascular;  Laterality: N/A;   EP IMPLANTABLE DEVICE N/A 02/20/2016   Procedure: Lead Extraction;  Surgeon: Marinus Maw, MD;  Location: [facility] INVASIVE CV LAB;  Service: Cardiovascular;  Laterality: N/A;   ESOPHAGOGASTRODUODENOSCOPY (EGD) WITH PROPOFOL N/A 05/19/2023   Procedure: ESOPHAGOGASTRODUODENOSCOPY (EGD) WITH PROPOFOL;  Surgeon: Midge Minium, MD;  Location: [facility] ENDOSCOPY;  Service: Endoscopy;  Laterality: N/A;   EYE SURGERY Right 2013   FEMUR IM NAIL Left 06/18/2021   Procedure: INTRAMEDULLARY (IM) NAIL FEMORAL, OPEN REDUCTION INTERNAL FIXATION FEMUR RIGHT LITTLE FINGER PIP CLOSED REDUCTION;  Surgeon: Myrene Galas, MD;  Location: [facility] OR;  Service: Orthopedics;  Laterality: Left;   FOOT SURGERY     ~2007 right foot bunion   MANDIBLE FRACTURE SURGERY  03/26/2013   MINIMALLY INVASIVE MAZE PROCEDURE N/A 10/31/2015   Procedure: MINIMALLY INVASIVE MAZE PROCEDURE;  Surgeon: Purcell Nails, MD;  Location: [facility] OR;  Service: Open Heart Surgery;  Laterality: N/A;   MITRAL VALVE REPLACEMENT Right 10/31/2015   Procedure: MINIMALLY INVASIVE MITRAL VALVE (MV) REPLACEMENT;  Surgeon: Purcell Nails, MD;  Location: [facility] OR;  Service: Open Heart Surgery;  Laterality: Right;   PACEMAKER LEAD REMOVAL  02/20/2016   TEE WITH CARDIOVERSION     TEE WITHOUT  CARDIOVERSION N/A 08/22/2015   Procedure: TRANSESOPHAGEAL ECHOCARDIOGRAM (TEE);  Surgeon: Vesta Mixer, MD;  Location: [facility] ENDOSCOPY;  Service: Cardiovascular;  Laterality: N/A;   TEE WITHOUT CARDIOVERSION N/A 10/31/2015   Procedure: TRANSESOPHAGEAL ECHOCARDIOGRAM (TEE);  Surgeon: Purcell Nails, MD;  Location: [facility] OR;  Service: Open Heart Surgery;  Laterality: N/A;   TUBAL LIGATION     VARICOSE VEIN SURGERY Right     Allergies  Allergen Reactions   Codeine Nausea And Vomiting and Other (See Comments)    migraine   Epinephrine Palpitations and Shortness Of Breath   Hydrocodone Nausea And Vomiting and Other (See Comments)    MIGRAINE   Hydromorphone Nausea And Vomiting and Other (See Comments)    migraine   Molds & Smuts Anxiety, Other (See Comments) and Shortness Of Breath   Oxycodone Nausea And Vomiting and Other (See Comments)    Severe migraine   Meloxicam Other (See Comments)    Severe reflux   Codeine Other (See Comments)  Migraine   Dextromethorphan Other (See Comments)    seizures   Diphen [Diphenhydramine Hcl] Other (See Comments)    seizure   Diphenhydramine Other (See Comments)    seizure   Diphenhydramine Hcl     Other reaction(s): Other (See Comments)   Diphenylpyraline Other (See Comments)   Doxycycline Itching, Swelling and Other (See Comments)    Facial   Doxycycline Itching and Swelling   Hydrocodone Other (See Comments)    Migraine   Hydromorphone Other (See Comments)    Migraine   Meloxicam Other (See Comments)   Mobic [Meloxicam] Other (See Comments)    reflux   Nsaids    Nsaids Other (See Comments)    Pt on blood thinner   Oxycodone Other (See Comments)    Migraine   Propofol Other (See Comments)   Propofol Other (See Comments)    Very sensitive; patient stated she was told she was told she had apnea    No facility-administered encounter medications on file as of 10/27/2023.   Outpatient Encounter Medications as of 10/27/2023  Medication  Sig   acetaminophen (TYLENOL) 325 MG tablet Take 650 mg by mouth every 4 (four) hours as needed for mild pain (pain score 1-3) or moderate pain (pain score 4-6). Give two tablets by mouth every 4 hours as needed.   albuterol (PROVENTIL) (2.5 MG/3ML) 0.083% nebulizer solution Take 3 mLs (2.5 mg total) by nebulization every 6 (six) hours as needed for wheezing or shortness of breath.   ARIPiprazole (ABILIFY) 10 MG tablet Take 10 mg by mouth every evening.   bismuth subsalicylate (PEPTO BISMOL) 262 MG/15ML suspension Take 30 mLs by mouth every 6 (six) hours as needed.   fluticasone (FLONASE) 50 MCG/ACT nasal spray Place 2 sprays into both nostrils daily.   furosemide (LASIX) 40 MG tablet Take 40 mg by mouth daily.   gabapentin (NEURONTIN) 100 MG capsule Take 200 mg by mouth 3 (three) times daily.   Iron, Ferrous Sulfate, 325 (65 Fe) MG TABS Take 1 tablet by mouth daily.   lamoTRIgine (LAMICTAL) 150 MG tablet Take 150 mg by mouth daily.   lamoTRIgine (LAMICTAL) 200 MG tablet Take 200 mg by mouth at bedtime.   meclizine (ANTIVERT) 25 MG tablet Take 12.5 mg by mouth every 12 (twelve) hours as needed for dizziness.   metoprolol tartrate (LOPRESSOR) 25 MG tablet Take 25 mg by mouth daily.   midodrine (PROAMATINE) 2.5 MG tablet Take 5 mg by mouth 3 (three) times daily with meals. Give one tablet by mouth daily as needed for low BP   montelukast (SINGULAIR) 10 MG tablet Take 1 tablet (10 mg total) by mouth at bedtime.   OLANZapine (ZYPREXA) 5 MG tablet Take 5 mg by mouth at bedtime.   ondansetron (ZOFRAN) 8 MG tablet Take 8 mg by mouth every 8 (eight) hours as needed for nausea or vomiting.   pantoprazole (PROTONIX) 40 MG tablet Take 1 tablet (40 mg total) by mouth 2 (two) times daily before a meal.   polyethylene glycol (MIRALAX / GLYCOLAX) 17 g packet Take 17 g by mouth daily as needed for mild constipation or moderate constipation.   potassium chloride (KLOR-CON) 20 MEQ packet Take 20 mEq by mouth daily.    rosuvastatin (CRESTOR) 40 MG tablet Take 1 tablet (40 mg total) by mouth daily.   VENTOLIN HFA 108 (90 Base) MCG/ACT inhaler INHALE 2 PUFFS INTO THE LUNGS EVERY 6 HOURS AS NEEDED FOR WHEEZING OR SHORTNESS OF BREATH    Review  of Systems  Immunization History  Administered Date(s) Administered   Fluad Quad(high Dose 65+) 08/06/2019, 08/25/2020, 08/23/2021, 08/28/2022   Hep A / Hep B 05/09/2016, 06/11/2016, 11/12/2016   Influenza Split 10/10/2013   Influenza Whole 10/10/2011   Influenza, High Dose Seasonal PF 08/19/2016, 09/05/2017   Influenza,inj,Quad PF,6+ Mos 08/01/2014, 08/04/2015   Influenza-Unspecified 09/05/2017, 09/17/2018, 09/21/2019   Moderna Covid-19 Vaccine Bivalent Booster 11yrs & up 08/31/2021   Moderna Sars-Covid-2 Vaccination 12/16/2019, 01/13/2020, 10/17/2020, 03/16/2021   PNEUMOCOCCAL CONJUGATE-20 10/11/2023   Pneumococcal Conjugate-13 01/10/2014   Pneumococcal Polysaccharide-23 06/25/2012   RSV,unspecified 10/11/2023   Tdap 06/25/2012   Zoster Recombinant(Shingrix) 04/24/2017, 08/26/2017   Zoster, Live 01/10/2006   Pertinent  Health Maintenance Due  Topic Date Due   INFLUENZA VACCINE  07/03/2023   DEXA SCAN  Completed      08/28/2022    4:24 PM 10/03/2022    1:56 PM 02/03/2023   11:01 AM 05/08/2023   12:37 PM 06/26/2023    8:56 AM  Fall Risk  Falls in the past year? 1  1 1 1   Was there an injury with Fall? 1  1 1 1   Fall Risk Category Calculator 2  3 3 3   Fall Risk Category (Retired) Moderate      (RETIRED) Patient Fall Risk Level High fall risk High fall risk     Patient at Risk for Falls Due to History of fall(s);Impaired balance/gait  History of fall(s) History of fall(s) History of fall(s)  Fall risk Follow up Falls evaluation completed  Falls evaluation completed Falls evaluation completed Falls evaluation completed   Functional Status Survey:    There were no vitals filed for this visit. There is no height or weight on file to calculate  BMI. Physical Exam HENT:     Head:     Comments: Right forehead with bruise and soft tissue swelling Cardiovascular:     Rate and Rhythm: Normal rate.     Pulses: Normal pulses.  Pulmonary:     Effort: Pulmonary effort is normal.  Neurological:     Mental Status: She is alert.     Comments: Alert and oriented, however, moving and talking slower cadence than typical.      Labs reviewed: Recent Labs    04/29/23 1500 05/17/23 1201 05/19/23 0410 05/20/23 0330 06/26/23 0930 08/12/23 0000 10/01/23 1444 10/13/23 0000 10/28/23 1036  NA 141   < > 137   < > 142   < > 144 142 138  K 3.2*   < > 3.5   < > 3.6   < > 3.7 3.6 3.4*  CL 102   < > 103   < > 101   < > 103 100 98  CO2 28   < > 25   < > 33*   < > 26 35* 27  GLUCOSE 99   < > 95   < > 93  --  139*  --  118*  BUN 29*   < > 19   < > 34*   < > 27 25* 28*  CREATININE 1.51*   < > 0.96   < > 1.51*   < > 1.49* 1.5* 1.67*  CALCIUM 9.4   < > 8.7*   < > 10.5   < > 9.6 9.8 9.6  MG 2.1  --  2.2  --  2.3  --   --   --   --    < > = values in this interval not  displayed.   Recent Labs    04/29/23 1501 05/17/23 1201 05/26/23 0000 07/01/23 1436 10/01/23 1444  AST 33 47* 26  --  33  ALT 24 26 15   --  16  ALKPHOS 109 123 129*  --  178*  BILITOT 0.9 1.5*  --   --  0.6  PROT 6.1* 6.8  --   --  5.9*  ALBUMIN 3.7 4.3  --  4.3 4.2   Recent Labs    04/17/23 0529 04/19/23 1331 04/23/23 0127 04/29/23 1500 05/20/23 0330 05/26/23 0000 06/02/23 1201 10/28/23 1036  WBC 3.4*   < > 4.3   < > 4.1  --  4.6 12.9*  NEUTROABS 2.2  --  3.1  --   --   --  3.1  --   HGB 9.0*   < > 9.8*   < > 7.7* 9.4* 8.9 Repeated and verified X2.* 9.7*  HCT 28.6*   < > 31.2*   < > 24.7*  --  27.3* 30.0*  MCV 95.0   < > 95.1   < > 95.4  --  92.3 95.2  PLT 91*   < > 107*   < > 109* 201 143.0* 114*   < > = values in this interval not displayed.   Lab Results  Component Value Date   TSH 4.842 (H) 04/29/2023   Lab Results  Component Value Date   HGBA1C 5.5  02/11/2020   Lab Results  Component Value Date   CHOL 143 05/21/2019   HDL 59.50 05/21/2019   LDLCALC 71 05/21/2019   LDLDIRECT 113.0 02/15/2016   TRIG 60.0 05/21/2019   CHOLHDL 2 05/21/2019    Assessment/Plan Recurrent falls  Atypical fracture of femur, sequela Patient with hx of fall and previous fractures of the hip and pelvis in the last 2-3 years. She denies pain at this time. Nontender. X-ray in house shows some concern for broken nails. Will plan for patient to have an evaluation with urgent care for further imaging of hip to aid with disposition. Patient has small bruise of the forehead from fall, however, has not had significant change in mental status at this point. She is eating, drinking, and conversant. Continue physical therapy at this time.   Family/ staff Communication: nursing  Labs/tests ordered:  none

## 2023-10-28 NOTE — ED Notes (Signed)
Patient 2 person assist to bathroom. Patient gave extremely small urine sample. Sent to lab at this time.

## 2023-10-28 NOTE — ED Provider Notes (Signed)
[facility] Provider Note    Event Date/Time   First MD Initiated Contact with Patient 10/28/23 1220     (approximate)   History   Fall   HPI  NITA LETSCHE is a 82 y.o. female with history of dementia, here with fall.  The patient has fallen twice in the last few days.  She was seen at her PCP as well as orthopedist for hip pain after the fall, and had a negative CT.  She reportedly fell again today.  She has had some bruising on her right forehead.  She seems more fatigued than usual, and less aware of what is going on.  She has a history of UTIs with similar presentations.  She denies any major complaints on my assessment.     Physical Exam   Triage Vital Signs: ED Triage Vitals  Encounter Vitals Group     BP 10/28/23 1035 106/67     Systolic BP Percentile --      Diastolic BP Percentile --      Pulse Rate 10/28/23 1035 70     Resp 10/28/23 1032 18     Temp 10/28/23 1032 98.1 F (36.7 C)     Temp src --      SpO2 10/28/23 1035 95 %     Weight 10/28/23 1034 145 lb 8.1 oz (66 kg)     Height 10/28/23 1034 5\' 5"  (1.651 m)     Head Circumference --      Peak Flow --      Pain Score --      Pain Loc --      Pain Education --      Exclude from Growth Chart --     Most recent vital signs: Vitals:   10/28/23 2030 10/28/23 2052  BP: 107/73   Pulse: 70   Resp:    Temp:  (!) 94.9 F (34.9 C)  SpO2: 97%      General: Awake, no distress.  CV:  Good peripheral perfusion.  Regular rate and rhythm. Resp:  Normal work of breathing.  Lungs clear to auscultation bilaterally. Abd:  No distention.  No tenderness.  No guarding or rebound. Other:  Healing ecchymoses and bruising to the right forehead.  No other facial swelling or asymmetry.  No neck pain.  Overall nontoxic.  Oriented to person and place but not time.   ED Results / Procedures / Treatments   Labs (all labs ordered are listed, but only abnormal results are displayed) Labs  Reviewed  CBC - Abnormal; Notable for the following components:      Result Value   WBC 12.9 (*)    RBC 3.15 (*)    Hemoglobin 9.7 (*)    HCT 30.0 (*)    RDW 17.2 (*)    Platelets 114 (*)    All other components within normal limits  BASIC METABOLIC PANEL - Abnormal; Notable for the following components:   Potassium 3.4 (*)    Glucose, Bld 118 (*)    BUN 28 (*)    Creatinine, Ser 1.67 (*)    GFR, Estimated 30 (*)    All other components within normal limits  IRON AND TIBC - Abnormal; Notable for the following components:   Saturation Ratios 10 (*)    All other components within normal limits  RETICULOCYTES - Abnormal; Notable for the following components:   RBC. 3.15 (*)    All other components within normal limits  SARS  CORONAVIRUS 2 BY RT PCR  CULTURE, BLOOD (ROUTINE X 2)  CULTURE, BLOOD (ROUTINE X 2)  EXPECTORATED SPUTUM ASSESSMENT W GRAM STAIN, RFLX TO RESP C  LACTIC ACID, PLASMA  BLOOD GAS, VENOUS  FERRITIN  FOLATE  URINALYSIS, ROUTINE W REFLEX MICROSCOPIC  STREP PNEUMONIAE URINARY ANTIGEN  VITAMIN B12  CBC  BASIC METABOLIC PANEL     EKG Ventricular paced rhythm, ventricular rate 76.  QRS 226, QTc 589.  No acute ST elevations or depressions.   RADIOLOGY CT head: No acute abnormality, forehead hematoma CT C-spine: Negative Chest x-ray:Multifocal PNA   I also independently reviewed and agree with radiologist interpretations.   PROCEDURES:  Critical Care performed: No   MEDICATIONS ORDERED IN ED: Medications  ipratropium-albuterol (DUONEB) 0.5-2.5 (3) MG/3ML nebulizer solution 3 mL (3 mLs Nebulization Not Given 10/28/23 1632)  guaiFENesin (MUCINEX) 12 hr tablet 600 mg (has no administration in time range)  heparin injection 5,000 Units (has no administration in time range)  ondansetron (ZOFRAN) tablet 4 mg (has no administration in time range)    Or  ondansetron (ZOFRAN) injection 4 mg (has no administration in time range)  acetaminophen (TYLENOL)  tablet 650 mg (has no administration in time range)    Or  acetaminophen (TYLENOL) suppository 650 mg (has no administration in time range)  pantoprazole (PROTONIX) injection 40 mg (40 mg Intravenous Given 10/28/23 1822)  midodrine (PROAMATINE) tablet 2.5 mg (0 mg Oral Hold 10/28/23 1803)  0.9 %  sodium chloride infusion ( Intravenous New Bag/Given 10/28/23 1817)  OLANZapine (ZYPREXA) injection 2.5 mg (has no administration in time range)  Ampicillin-Sulbactam (UNASYN) 3 g in sodium chloride 0.9 % 100 mL IVPB (0 g Intravenous Stopped 10/28/23 1852)  potassium chloride 10 mEq in 100 mL IVPB (10 mEq Intravenous New Bag/Given 10/28/23 2029)  ARIPiprazole (ABILIFY) tablet 10 mg (has no administration in time range)  OLANZapine (ZYPREXA) tablet 5 mg (has no administration in time range)  meclizine (ANTIVERT) tablet 12.5 mg (has no administration in time range)  gabapentin (NEURONTIN) capsule 200 mg (has no administration in time range)  lamoTRIgine (LAMICTAL) tablet 150 mg (has no administration in time range)  lamoTRIgine (LAMICTAL) tablet 200 mg (has no administration in time range)  albuterol (PROVENTIL) (2.5 MG/3ML) 0.083% nebulizer solution 2.5 mg (has no administration in time range)  fluticasone (FLONASE) 50 MCG/ACT nasal spray 2 spray (has no administration in time range)  sodium chloride 0.9 % bolus 1,000 mL (0 mLs Intravenous Stopped 10/28/23 1502)  cefTRIAXone (ROCEPHIN) 2 g in sodium chloride 0.9 % 100 mL IVPB (0 g Intravenous Stopped 10/28/23 1706)  sodium chloride 0.9 % bolus 1,000 mL (0 mLs Intravenous Stopped 10/28/23 1712)     IMPRESSION / MDM / ASSESSMENT AND PLAN / ED COURSE  I reviewed the triage vital signs and the nursing notes.                              Differential diagnosis includes, but is not limited to, aspiration PNA, UTI, CHF, ACS, CVA, metabolic encephalopathy  Patient's presentation is most consistent with acute presentation with potential threat to life or  bodily function.  The patient is on the cardiac monitor to evaluate for evidence of arrhythmia and/or significant heart rate changes  82 yo F with PMHx dementia, restrictive lung disease, AFib, CVA, here with confusion, decreased LOC, falls. Suspect aspiration PNA with subsequent encephalopathy. Mild hypoxia noted - improved on 2L [place]. CBC with mild  leukocytosis. BMP c/w mild dehydration. CXR suggest multifocal PNA. Will cover empirically, admit to medicine.    FINAL CLINICAL IMPRESSION(S) / ED DIAGNOSES   Final diagnoses:  Acute encephalopathy  Aspiration pneumonia of both lungs, unspecified aspiration pneumonia type, unspecified part of lung (HCC)     Rx / DC Orders   ED Discharge Orders     None        Note:  This document was prepared using Dragon voice recognition software and may include unintentional dictation errors.   Shaune Pollack, MD 10/28/23 2113

## 2023-10-29 ENCOUNTER — Encounter: Payer: Self-pay | Admitting: Oncology

## 2023-10-29 DIAGNOSIS — G9340 Encephalopathy, unspecified: Secondary | ICD-10-CM

## 2023-10-29 DIAGNOSIS — J690 Pneumonitis due to inhalation of food and vomit: Secondary | ICD-10-CM | POA: Diagnosis not present

## 2023-10-29 DIAGNOSIS — W19XXXA Unspecified fall, initial encounter: Secondary | ICD-10-CM | POA: Diagnosis not present

## 2023-10-29 DIAGNOSIS — Y92009 Unspecified place in unspecified non-institutional (private) residence as the place of occurrence of the external cause: Secondary | ICD-10-CM

## 2023-10-29 DIAGNOSIS — F03918 Unspecified dementia, unspecified severity, with other behavioral disturbance: Secondary | ICD-10-CM

## 2023-10-29 LAB — CBC
HCT: 30.2 % — ABNORMAL LOW (ref 36.0–46.0)
Hemoglobin: 9.6 g/dL — ABNORMAL LOW (ref 12.0–15.0)
MCH: 30.6 pg (ref 26.0–34.0)
MCHC: 31.8 g/dL (ref 30.0–36.0)
MCV: 96.2 fL (ref 80.0–100.0)
Platelets: 107 10*3/uL — ABNORMAL LOW (ref 150–400)
RBC: 3.14 MIL/uL — ABNORMAL LOW (ref 3.87–5.11)
RDW: 17.2 % — ABNORMAL HIGH (ref 11.5–15.5)
WBC: 14.2 10*3/uL — ABNORMAL HIGH (ref 4.0–10.5)
nRBC: 0 % (ref 0.0–0.2)

## 2023-10-29 LAB — STREP PNEUMONIAE URINARY ANTIGEN: Strep Pneumo Urinary Antigen: NEGATIVE

## 2023-10-29 LAB — BASIC METABOLIC PANEL
Anion gap: 11 (ref 5–15)
BUN: 25 mg/dL — ABNORMAL HIGH (ref 8–23)
CO2: 23 mmol/L (ref 22–32)
Calcium: 8.6 mg/dL — ABNORMAL LOW (ref 8.9–10.3)
Chloride: 104 mmol/L (ref 98–111)
Creatinine, Ser: 1.52 mg/dL — ABNORMAL HIGH (ref 0.44–1.00)
GFR, Estimated: 34 mL/min — ABNORMAL LOW (ref >60)
Glucose, Bld: 86 mg/dL (ref 70–99)
Potassium: 3.4 mmol/L — ABNORMAL LOW (ref 3.5–5.1)
Sodium: 138 mmol/L (ref 135–145)

## 2023-10-29 LAB — VITAMIN B12: Vitamin B-12: 1368 pg/mL — ABNORMAL HIGH (ref 180–914)

## 2023-10-29 MED ORDER — SODIUM CHLORIDE 0.9 % IV SOLN: INTRAVENOUS | Status: AC

## 2023-10-29 NOTE — ED Notes (Signed)
Dr. Allena Katz in to see pt--she was informed that the pt's been refusing all of her medications. No family has been seen thus far while here. Sitter cont at bedside

## 2023-10-29 NOTE — ED Notes (Signed)
Pt has sitter at bedside. She has refused all medications that are documented.  She is asking this RN for scissors and is not oriented besides herself.  Swallow eval is happening at this time. Will attempt to administer her po medications if pt passes evaluation.

## 2023-10-29 NOTE — ED Notes (Signed)
Pt trying to get out of bed, kicking staff, trying to climb over bed rails

## 2023-10-29 NOTE — ED Notes (Signed)
This tech assisted in feeding this Pt to eat. Encouraged them to take a few bites after hesitation in eating and had some cranberry juice. After eating this tech helped change this Pt brief after experiencing incontinence and also had a small bowel movement.

## 2023-10-29 NOTE — Progress Notes (Signed)
RN attempted to administer heparin injection. Patient became agitated and combative. RN was unable to administer heparin injection. Notified Larkin Ina NP

## 2023-10-29 NOTE — ED Notes (Signed)
Pt hitting and kicking this RN, attempting to climb over bed rails

## 2023-10-29 NOTE — Evaluation (Signed)
Occupational Therapy Evaluation Patient Details Name: [name] [name] MRN: [id number] DOB: 12/27/1940 Today's Date: 10/29/2023   History of Present Illness Pt is an 82 year old female presenting to ED with frequent falls, decreased oral intake, AMS; admitted with aspiration pneumonia, sepsis     PMH significant for  dementia, PAF off Eliquis secondary to frequent falls and major GI bleed, advanced dementia, chronic dysphagia, seizure disorder, CKD stage III, orthostatic hypotension on midodrine, anxiety/depression   Clinical Impression   Chart reviewed to date, co tx completed with PT on this date. Pt is greeted in room, restless and impulsive at times; oriented to self and . Pt is a poor historian at this time, reports she amb with a RW with help, uses a mwc at times as well. PLOF in regards to ADL/IADL will need to be clarified. Pt does report she lives in a [place] at [facility]. Pt presents with deficits in activity tolerance, cognition, balance affecting safe and optimal ADL completion. MAX-TOTAL A +2 required for rolling with step by step multi modal dues. TOTAL A +2 for LB dressing/ toileting at bed level. Pt does perform semi supine<>long sit with supervision. Current level of performance is affected by cognitive status at this time. Pt will benefit from acute OT to address functional deficits. OT will follow acutely to facilitate optimal ADL completion. Pt is left in bed, safety maintained. OT will follow acutely.       If plan is discharge home, recommend the following: A lot of help with walking and/or transfers;A lot of help with bathing/dressing/bathroom;Assistance with cooking/housework;Direct supervision/assist for medications management;Assist for transportation;Supervision due to cognitive status;Help with stairs or ramp for entrance;Direct supervision/assist for financial management;Assistance with feeding    Functional Status Assessment  Patient has had a recent decline  in their functional status and demonstrates the ability to make significant improvements in function in a reasonable and predictable amount of time.  Equipment Recommendations  Other (comment) (defer to next venue of care)    Recommendations for Other Services       Precautions / Restrictions Precautions Precautions: Fall Restrictions Weight Bearing Restrictions: No      Mobility Bed Mobility Overal bed mobility: Needs Assistance Bed Mobility: Rolling Rolling: Max assist, Total assist, +2 for physical assistance (step by step multi modal cues)         General bed mobility comments: Pt is able to perform supine>long sit with supervision; current level of performance impacted by current level of cognition    Transfers                   General transfer comment: deferred on this date      Balance Overall balance assessment: Needs assistance                                         ADL either performed or assessed with clinical judgement   ADL Overall ADL's : Needs assistance/impaired Eating/Feeding: NPO   Grooming: Wash/dry face;Maximal assistance;Sitting Grooming Details (indicate cue type and reason): long sit in bed             Lower Body Dressing: Total assistance;+2 for physical assistance       Toileting- Clothing Manipulation and Hygiene: Total assistance;+2 for physical assistance;Bed level         General ADL Comments: ADL participation impacted by current cognitive level  Vision   Additional Comments: will continue to assess;step by step multi modal cues required for participation      Perception         Praxis         Pertinent Vitals/Pain Pain Assessment Pain Assessment: PAINAD Breathing: normal Negative Vocalization: occasional moan/groan, low speech, negative/disapproving quality Facial Expression: sad, frightened, frown Body Language: tense, distressed pacing, fidgeting Consolability: distracted or  reassured by voice/touch PAINAD Score: 4 Pain Intervention(s): Monitored during session     Extremity/Trunk Assessment Upper Extremity Assessment Upper Extremity Assessment: Difficult to assess due to impaired cognition;Generalized weakness BUE A/PROM appears grossly WFL will continue to assess   Lower Extremity Assessment Lower Extremity Assessment: Difficult to assess due to impaired cognition;Generalized weakness       Communication Communication Communication: Difficulty following commands/understanding;Difficulty communicating thoughts/reduced clarity of speech Following commands: Follows one step commands inconsistently Cueing Techniques: Verbal cues;Gestural cues;Tactile cues;Visual cues   Cognition Arousal: Alert Behavior During Therapy: Restless, Impulsive Overall Cognitive Status: No family/caregiver present to determine baseline cognitive functioning Area of Impairment: Orientation, Attention, Memory, Following commands, Safety/judgement, Awareness                 Orientation Level: Disoriented to, Place, Time, Situation (oriented to [facility]) Current Attention Level: Focused Memory: Decreased short-term memory, Decreased recall of precautions Following Commands: Follows one step commands inconsistently Safety/Judgement: Decreased awareness of safety, Decreased awareness of deficits Awareness: Intellectual         General Comments  Spo2 to 88% on 2 L via [place] during mobility, quickly recovers to >90% on 2 L via ; all other vitals appear stable; Mitts donned throughout    Exercises Other Exercises Other Exercises: edu re: role of OT, role of rehab   Shoulder Instructions      Home Living Family/patient expects to be discharged to:: Skilled nursing facility                                 Additional Comments: pt reports she lives at [facility] in a [place], in chart reports SNF; will need to confirm; pt is unable to provide PLOF/home set up  at this time      Prior Functioning/Environment Prior Level of Function : Patient poor historian/Family not available             Mobility Comments: pt is a poor historian, reports she uses a RW with assistance, farther MRADLs with mwc; will need to confirm ADLs Comments: per chart 6 months ago indep with ADL/IADL; pt is a poor historian and unable to provide PLOF at this time        OT Problem List: Decreased strength;Impaired balance (sitting and/or standing);Decreased cognition;Decreased knowledge of precautions;Decreased range of motion;Decreased safety awareness;Cardiopulmonary status limiting activity;Decreased activity tolerance;Decreased knowledge of use of DME or AE      OT Treatment/Interventions: Self-care/ADL training;DME and/or AE instruction;Therapeutic activities;Balance training;Therapeutic exercise;Cognitive remediation/compensation;Neuromuscular education;Energy conservation;Patient/family education    OT Goals(Current goals can be found in the care plan section) Acute Rehab OT Goals OT Goal Formulation: Patient unable to participate in goal setting Time For Goal Achievement: 11/12/23 ADL Goals Pt Will Perform Grooming: with min assist;sitting Pt Will Perform Lower Body Dressing: with mod assist;sitting/lateral leans Pt Will Transfer to Toilet: with min assist Pt Will Perform Toileting - Clothing Manipulation and hygiene: with mod assist;sitting/lateral leans  OT Frequency: Min 1X/week    Co-evaluation PT/OT/SLP Co-Evaluation/Treatment: Yes Reason  for Co-Treatment: Complexity of the patient's impairments (multi-system involvement);Necessary to address cognition/behavior during functional activity;For patient/therapist safety;To address functional/ADL transfers   OT goals addressed during session: ADL's and self-care      AM-PAC OT "6 Clicks" Daily Activity     Outcome Measure Help from another person eating meals?: Total Help from another person taking care  of personal grooming?: A Lot Help from another person toileting, which includes using toliet, bedpan, or urinal?: Total Help from another person bathing (including washing, rinsing, drying)?: Total Help from another person to put on and taking off regular upper body clothing?: A Lot Help from another person to put on and taking off regular lower body clothing?: A Lot 6 Click Score: 9   End of Session Equipment Utilized During Treatment: Oxygen Nurse Communication: Mobility status  Activity Tolerance: Other (comment) (limited by cognitive status) Patient left: in bed;with call bell/phone within reach;with nursing/sitter in room  OT Visit Diagnosis: Other abnormalities of gait and mobility (R26.89);Muscle weakness (generalized) (M62.81);Cognitive communication deficit (R41.841) Symptoms and signs involving cognitive functions: Other cerebrovascular disease                Time: 4034-7425 OT Time Calculation (min): 15 min Charges:  OT General Charges $OT Visit: 1 Visit OT Evaluation $OT Eval Moderate Complexity: 1 Mod Oleta Mouse, OTD OTR/L  10/29/23, 9:12 AM

## 2023-10-29 NOTE — ED Notes (Signed)
Swallow eval was completed. The person doing the evaluation stated that the pt spat out the applesauce at her.  This RN attempted to give the pt her po meds, since her evaluation was done, with some applesauce--pt refusing her po medications. Pt is now asking for a "cutter" so she can cut her soft mitts off. Sitter remains at bedside. Pt has been repositioned and checked for cleanliness.  Will cont to monitor

## 2023-10-29 NOTE — Evaluation (Signed)
Physical Therapy Evaluation Patient Details Name: [name] [name] MRN: [id number] DOB: 26-Nov-1941 Today's Date: 10/29/2023  History of Present Illness  Patient is a 82 year old with with frequent falls and feeling weak, aspiration pneumonia. History of dementia  Clinical Impression  Patient has difficulty following single step commands and needs multi modal cues with mobility efforts. She required assistance for rolling in bed with cues for sequencing and task initiation. She is able to sit herself up in bed several times. Brief was wet with urine and changed during session. The patient reports she has frequent falls but is typically ambulatory with assistance using rolling walker at [facility]. Recommend PT follow to maximize independence and decrease caregiver burden.       If plan is discharge home, recommend the following: Two people to help with walking and/or transfers;Two people to help with bathing/dressing/bathroom;Help with stairs or ramp for entrance;Assist for transportation;Assistance with cooking/housework;Direct supervision/assist for medications management;Direct supervision/assist for financial management;Supervision due to cognitive status   Can travel by private vehicle   No    Equipment Recommendations None recommended by PT  Recommendations for Other Services       Functional Status Assessment Patient has had a recent decline in their functional status and demonstrates the ability to make significant improvements in function in a reasonable and predictable amount of time.     Precautions / Restrictions Precautions Precautions: Fall Restrictions Weight Bearing Restrictions: No      Mobility  Bed Mobility Overal bed mobility: Needs Assistance Bed Mobility: Rolling Rolling: Max assist, Total assist, +2 for physical assistance         General bed mobility comments: verbal cues for technique. patient has difficulty sequencing rolling. she performs long  sitting independently several times in bed as well.    Transfers                   General transfer comment: deferred for safety due to confusion and difficulty following commands    Ambulation/Gait                  Stairs            Wheelchair Mobility     Tilt Bed    Modified Rankin (Stroke Patients Only)       Balance Overall balance assessment: Needs assistance Sitting-balance support: Bilateral upper extremity supported Sitting balance-Leahy Scale: Poor Sitting balance - Comments: posterior lean with long sitting                                     Pertinent Vitals/Pain Pain Assessment Pain Assessment: Faces Faces Pain Scale: Hurts a little bit Pain Location: L shoulder and L knee with movement Pain Descriptors / Indicators: Discomfort Pain Intervention(s): Limited activity within patient's tolerance, Monitored during session, Repositioned    Home Living Family/patient expects to be discharged to:: Skilled nursing facility                   Additional Comments: [facility], unsure of how much assistance she has    Prior Function Prior Level of Function : Patient poor historian/Family not available             Mobility Comments: patient reports multiple falls. she also reports she is ambulatory with a rolling walker with assistance ADLs Comments: presumably patient required at least set-up/assistance for ADLs, poor historian unable to state  Extremity/Trunk Assessment   Upper Extremity Assessment Upper Extremity Assessment: Defer to OT evaluation;Generalized weakness    Lower Extremity Assessment Lower Extremity Assessment: Generalized weakness;Difficult to assess due to impaired cognition       Communication   Communication Communication: Difficulty following commands/understanding;Difficulty communicating thoughts/reduced clarity of speech Following commands: Follows one step commands with  increased time Cueing Techniques: Verbal cues;Gestural cues;Tactile cues;Visual cues  Cognition Arousal: Alert Behavior During Therapy: Restless, Impulsive Overall Cognitive Status: No family/caregiver present to determine baseline cognitive functioning Area of Impairment: Orientation, Attention, Memory, Following commands, Safety/judgement, Awareness                 Orientation Level: Disoriented to, Place, Time, Situation (oriented to [facility]) Current Attention Level: Focused Memory: Decreased short-term memory, Decreased recall of precautions Following Commands: Follows one step commands inconsistently Safety/Judgement: Decreased awareness of safety, Decreased awareness of deficits Awareness: Intellectual            General Comments General comments (skin integrity, edema, etc.): Sp02 88% briefly with increase to the 90's with rest break on 2 L02    Exercises     Assessment/Plan    PT Assessment Patient needs continued PT services  PT Problem List Decreased strength;Decreased range of motion;Decreased activity tolerance;Decreased balance;Decreased mobility;Decreased safety awareness;Decreased cognition       PT Treatment Interventions DME instruction;Gait training;Functional mobility training;Therapeutic activities;Therapeutic exercise;Balance training;Neuromuscular re-education;Cognitive remediation;Patient/family education    PT Goals (Current goals can be found in the Care Plan section)  Acute Rehab PT Goals Patient Stated Goal: unable to state PT Goal Formulation: With patient Time For Goal Achievement: 11/12/23 Potential to Achieve Goals: Poor    Frequency Min 1X/week     Co-evaluation PT/OT/SLP Co-Evaluation/Treatment: Yes Reason for Co-Treatment: Complexity of the patient's impairments (multi-system involvement);To address functional/ADL transfers;Necessary to address cognition/behavior during functional activity PT goals addressed during session:  Mobility/safety with mobility OT goals addressed during session: ADL's and self-care       AM-PAC PT "6 Clicks" Mobility  Outcome Measure Help needed turning from your back to your side while in a flat bed without using bedrails?: A Lot Help needed moving from lying on your back to sitting on the side of a flat bed without using bedrails?: A Lot Help needed moving to and from a bed to a chair (including a wheelchair)?: Total Help needed standing up from a chair using your arms (e.g., wheelchair or bedside chair)?: Total Help needed to walk in hospital room?: Total Help needed climbing 3-5 steps with a railing? : Total 6 Click Score: 8    End of Session   Activity Tolerance: Patient tolerated treatment well Patient left: in bed;with call bell/phone within reach (1:1 sitter in the room) Nurse Communication: Mobility status PT Visit Diagnosis: Unsteadiness on feet (R26.81);Repeated falls (R29.6)    Time: 1610-9604 PT Time Calculation (min) (ACUTE ONLY): 20 min   Charges:   PT Evaluation $PT Eval Low Complexity: 1 Low   PT General Charges $$ ACUTE PT VISIT: 1 Visit        Donna Bernard, PT, MPT  Ina Homes 10/29/2023, 9:25 AM

## 2023-10-29 NOTE — Progress Notes (Signed)
[facility]  - [place] at [facility]   PATIENT NAME: [name] [name]    MR#:  [id number]  DATE OF BIRTH:  1941-10-23  SUBJECTIVE:  sitter at bedside. No family during my evaluation. Patient given advanced dementia would not communicate. Per RN spit out meds and not able to redirect for taking oral meds    VITALS:  Blood pressure 134/74, pulse 77, temperature 97.8 F (36.6 C), resp. rate 17, height 5\' 5"  (1.651 m), weight 66 kg, SpO2 94%.  PHYSICAL EXAMINATION:  limited GENERAL:  82 y.o.-year-old patient with no acute distress.  LUNGS: Normal breath sounds bilaterally CARDIOVASCULAR: S1, S2 normal. No murmur   ABDOMEN: Soft, nontender, nondistended. EXTREMITIES: No  edema b/l.    NEUROLOGIC: nonfocal  patient is alert , does not want to communicate   LABORATORY PANEL:  CBC Recent Labs  Lab 10/29/23 0412  WBC 14.2*  HGB 9.6*  HCT 30.2*  PLT 107*    Chemistries  Recent Labs  Lab 10/29/23 0412  NA 138  K 3.4*  CL 104  CO2 23  GLUCOSE 86  BUN 25*  CREATININE 1.52*  CALCIUM 8.6*   Cardiac Enzymes No results for input(s): "TROPONINI" in the last 168 hours. RADIOLOGY:  CT Chest Wo Contrast  Result Date: 10/28/2023 CLINICAL DATA:  Pneumonia complication suspected. EXAM: CT CHEST WITHOUT CONTRAST TECHNIQUE: Multidetector CT imaging of the chest was performed following the standard protocol without IV contrast. RADIATION DOSE REDUCTION: This exam was performed according to the departmental dose-optimization program which includes automated exposure control, adjustment of the mA and/or kV according to patient size and/or use of iterative reconstruction technique. COMPARISON:  Chest radiograph dated 10/28/2023 and CT dated 08/07/2017. FINDINGS: Evaluation of this exam is limited in the absence of intravenous contrast as well as due to respiratory motion. Cardiovascular: Moderate cardiomegaly. No pericardial effusion. There is coronary vascular calcification  and left pectoral pacemaker device. Mechanical mitral valve. Mild atherosclerotic calcification of the thoracic aorta. There is dilatation of the main pulmonary trunk suggestive of pulmonary hypertension. Mediastinum/Nodes: No obvious hilar or mediastinal adenopathy. The esophagus is grossly unremarkable. No mediastinal fluid collection. Lungs/Pleura: Bilateral upper lobe predominant patchy and streaky pulmonary opacities consistent with pneumonia. There is trace right pleural effusion. No pneumothorax. The central airways are patent. Upper Abdomen: Small liver cysts and additional hypodense lesions which are not characterized on this CT. There is mild biliary ductal dilatation or periportal edema. Musculoskeletal: Displaced healing subacute or old fracture of the body of the sternum with callus formation. There is osteopenia with degenerative changes of the spine. Several subacute or old appearing bilateral rib fractures. No definite acute osseous pathology. IMPRESSION: 1. Bilateral upper lobe predominant pneumonia. 2. Trace right pleural effusion. 3. Moderate cardiomegaly. 4.  Aortic Atherosclerosis (ICD10-I70.0). Electronically Signed   By: Elgie Collard M.D.   On: 10/28/2023 17:08   DG Chest 2 View  Result Date: 10/28/2023 CLINICAL DATA:  Shortness of breath. EXAM: CHEST - 2 VIEW COMPARISON:  04/29/2023. FINDINGS: Redemonstration of heterogeneous nonspecific opacities throughout bilateral lungs with right upper/mid lung zone predominance, essentially similar/slightly less conspicuous to the prior study. Findings may represent combination of atelectasis/scarring with superimposed alveolar opacities. Bilateral lungs are otherwise clear. There is mild pulmonary vascular congestion. Bilateral costophrenic angles are clear. Stable moderately enlarged cardio-mediastinal silhouette. There is a left sided 2-lead pacemaker. Left atrial appendage closure device and prosthetic mitral valve again seen. No acute  osseous abnormalities. The soft tissues are within normal limits. IMPRESSION: *  Mild pulmonary vascular congestion. *Redemonstration of heterogeneous nonspecific opacities throughout bilateral lungs with right upper/mid lung zone predominance, essentially similar/slightly less conspicuous to the prior study. Findings may represent combination of atelectasis/scarring with superimposed alveolar opacities which may be due to edema versus pneumonitis. Correlate clinically to determine the need for additional imaging with chest CT scan. Electronically Signed   By: Jules Schick M.D.   On: 10/28/2023 14:52   CT HEAD WO CONTRAST ( )  Result Date: 10/28/2023 CLINICAL DATA:  Provided history: Neck trauma. Head trauma, minor. Additional history provided: Fall (with head trauma). Right forehead ecchymosis. EXAM: CT HEAD WITHOUT CONTRAST CT CERVICAL SPINE WITHOUT CONTRAST TECHNIQUE: Multidetector CT imaging of the head and cervical spine was performed following the standard protocol without intravenous contrast. Multiplanar CT image reconstructions of the cervical spine were also generated. RADIATION DOSE REDUCTION: This exam was performed according to the departmental dose-optimization program which includes automated exposure control, adjustment of the mA and/or kV according to patient size and/or use of iterative reconstruction technique. COMPARISON:  Head CT 05/18/2023.  Cervical spine CT 04/17/2023. FINDINGS: CT HEAD FINDINGS Brain: Mild generalized parenchymal atrophy. Patchy and ill-defined hypoattenuation within the cerebral white matter, nonspecific but compatible with mild chronic small vessel ischemic disease. There is no acute intracranial hemorrhage. No demarcated cortical infarct. No extra-axial fluid collection. No evidence of an intracranial mass. No midline shift. Vascular: No hyperdense vessel. Atherosclerotic calcifications. Skull: No calvarial fracture or aggressive osseous lesion. Sinuses/Orbits: No  acute orbital finding. Prior right ocular lens replacement. Small-volume frothy secretions within the left sphenoid sinus. Other: Forehead hematoma. Trace fluid within the right mastoid air cells. CT CERVICAL SPINE FINDINGS Mildly motion degraded exam. Within this limitation, findings are as follows. Alignment: Slight C7-T1 and T1-T2 grade 1 anterolisthesis. Dextrocurvature of the cervical spine. Levocurvature of the upper thoracic spine, partially imaged. Skull base and vertebrae: The basion-dental and atlanto-dental intervals are maintained.No evidence of acute fracture to the cervical spine. Soft tissues and spinal canal: Subcentimeter thyroid nodules not meeting consensus criteria for ultrasound follow-up based on size. No follow-up imaging recommended. Reference: J Am Coll Radiol. 2015 Feb;12(2): 143-50. Disc levels: Cervical spondylosis. No more than mild disc space narrowing. Shallow multilevel disc bulges/central disc protrusions. Multilevel uncovertebral hypertrophy and facet arthrosis. No appreciable high-grade spinal canal stenosis. No significant bony neural foraminal narrowing. Upper chest: No consolidation within the imaged lung apices. No visible pneumothorax. Biapical pleuroparenchymal scarring. Other: Chronic fracture deformity of the distal left clavicle. IMPRESSION: CT head: 1.  No evidence of an acute intracranial abnormality. 2. Forehead hematoma. 3. Mild parenchymal atrophy and chronic small vessel ischemic disease. 4. Mild left sphenoid sinusitis. 5. Trace right mastoid effusion. CT cervical spine: 1. Mildly motion degraded exam. 2. No evidence of an acute cervical spine fracture. 3. Mild grade 1 anterolisthesis at C7-T1 and T1-T2. 4. Dextrocurvature of the cervical spine and partially imaged levocurvature of the upper thoracic spine. 5. Cervical spondylosis as described. Electronically Signed   By: Jackey Loge D.O.   On: 10/28/2023 12:56   CT Cervical Spine Wo Contrast  Result Date:  10/28/2023 CLINICAL DATA:  Provided history: Neck trauma. Head trauma, minor. Additional history provided: Fall (with head trauma). Right forehead ecchymosis. EXAM: CT HEAD WITHOUT CONTRAST CT CERVICAL SPINE WITHOUT CONTRAST TECHNIQUE: Multidetector CT imaging of the head and cervical spine was performed following the standard protocol without intravenous contrast. Multiplanar CT image reconstructions of the cervical spine were also generated. RADIATION DOSE REDUCTION: This exam was performed according  to the departmental dose-optimization program which includes automated exposure control, adjustment of the mA and/or kV according to patient size and/or use of iterative reconstruction technique. COMPARISON:  Head CT 05/18/2023.  Cervical spine CT 04/17/2023. FINDINGS: CT HEAD FINDINGS Brain: Mild generalized parenchymal atrophy. Patchy and ill-defined hypoattenuation within the cerebral white matter, nonspecific but compatible with mild chronic small vessel ischemic disease. There is no acute intracranial hemorrhage. No demarcated cortical infarct. No extra-axial fluid collection. No evidence of an intracranial mass. No midline shift. Vascular: No hyperdense vessel. Atherosclerotic calcifications. Skull: No calvarial fracture or aggressive osseous lesion. Sinuses/Orbits: No acute orbital finding. Prior right ocular lens replacement. Small-volume frothy secretions within the left sphenoid sinus. Other: Forehead hematoma. Trace fluid within the right mastoid air cells. CT CERVICAL SPINE FINDINGS Mildly motion degraded exam. Within this limitation, findings are as follows. Alignment: Slight C7-T1 and T1-T2 grade 1 anterolisthesis. Dextrocurvature of the cervical spine. Levocurvature of the upper thoracic spine, partially imaged. Skull base and vertebrae: The basion-dental and atlanto-dental intervals are maintained.No evidence of acute fracture to the cervical spine. Soft tissues and spinal canal: Subcentimeter thyroid  nodules not meeting consensus criteria for ultrasound follow-up based on size. No follow-up imaging recommended. Reference: J Am Coll Radiol. 2015 Feb;12(2): 143-50. Disc levels: Cervical spondylosis. No more than mild disc space narrowing. Shallow multilevel disc bulges/central disc protrusions. Multilevel uncovertebral hypertrophy and facet arthrosis. No appreciable high-grade spinal canal stenosis. No significant bony neural foraminal narrowing. Upper chest: No consolidation within the imaged lung apices. No visible pneumothorax. Biapical pleuroparenchymal scarring. Other: Chronic fracture deformity of the distal left clavicle. IMPRESSION: CT head: 1.  No evidence of an acute intracranial abnormality. 2. Forehead hematoma. 3. Mild parenchymal atrophy and chronic small vessel ischemic disease. 4. Mild left sphenoid sinusitis. 5. Trace right mastoid effusion. CT cervical spine: 1. Mildly motion degraded exam. 2. No evidence of an acute cervical spine fracture. 3. Mild grade 1 anterolisthesis at C7-T1 and T1-T2. 4. Dextrocurvature of the cervical spine and partially imaged levocurvature of the upper thoracic spine. 5. Cervical spondylosis as described. Electronically Signed   By: Jackey Loge D.O.   On: 10/28/2023 12:56    Assessment and Plan  [name] [name] is a 82 y.o. female with medical history significant of dementia, PAF off Eliquis secondary to frequent falls and major GI bleed, advanced dementia, chronic dysphagia, seizure disorder, CKD stage III, orthostatic hypotension on midodrine, anxiety/depression sent from nursing home for evaluation of frequent falls, decreased oral intake and altered mentations.   And patient sustained 2 falls in last 3 days. 1 episode was the weekend and patient fell on her hip and complained of pain but x-ray showed no fracture or dislocation. Today patient fell again and hit her head. No LOC   Chest CT showed scattered infiltrates and consolidation bilaterally peripheral  compatible with multifocal pneumonia and implying chronic aspiration.   History of falls without any trauma -- imaging studies no fracture -- PT OT orders placed. Patient has advanced dementia and sure how much he will work will give it a try  Pneumonia appears aspiration given history of dysphagia -- IV Unasyn -- patient did not cooperate with speech therapy  PAF -Paced rhythm -On low-dose of metoprolol -Decision was made earlier this year to remove Eliquis due to frequent falls and a major GI bleed this year.   Advanced dementia -Have to hold off her routine pills until mentation improves -As needed Zyprexa for sundowning and agitation -- poor PO intake.  Patient hard to redirect given advanced dementia. For safety will need sitter   CKD stage IIIb -Volume contracted, creatinine level stable, on IV fluid -- baseline creatinine 0.94-- 1.2 -- patient receiving IV fluid since she is not taking anything orally    Family communication : none in the ER Consults : CODE STATUS: DNR DVT Prophylaxis : heparin Level of care: Telemetry Medical Status is: Inpatient Remains inpatient appropriate because: fall, pneumonia, agitation    TOTAL TIME TAKING CARE OF THIS PATIENT: 35 minutes.  >50% time spent on counselling and coordination of care  Note: This dictation was prepared with Dragon dictation along with smaller phrase technology. Any transcriptional errors that result from this process are unintentional.  Enedina Finner M.D    [facility]   CC: Primary care physician; Earnestine Mealing, MD

## 2023-10-29 NOTE — ED Notes (Signed)
Fall pads in place

## 2023-10-29 NOTE — ED Notes (Signed)
This tech changed pt's brief after pt experienced urinary incontinence. Pt has on clean brief and is siting up in bed.

## 2023-10-29 NOTE — Evaluation (Addendum)
Clinical/Bedside Swallow Evaluation Patient Details  Name: [name] [name] MRN: [id number] Date of Birth: 10-Aug-1941  Today's Date: 10/29/2023 Time: SLP Start Time (ACUTE ONLY): 1150 SLP Stop Time (ACUTE ONLY): 1240 SLP Time Calculation (min) (ACUTE ONLY): 50 min  Past Medical History:  Past Medical History:  Diagnosis Date   Acute on chronic diastolic (congestive) heart failure (HCC)    Allergy    See list   Altered mental status 04/23/2023   Anemia 2018   Anxiety    Occasionally take Xanax for sleep   Anxiety associated with depression    Prn alprazolam    Anxiety associated with depression    Arthritis    Hands, Back   Atrial fibrillation, persistent (HCC)    DCCV 08/22/2015   Bradycardia post-op bradycardia, pacer dependent   MDT PPM 11/06/15, Dr. Ladona Ridgel   Cataract    Left eye   Chronic kidney disease (CKD) stage G3a/A2, moderately decreased glomerular filtration rate (GFR) between 45-59 mL/min/1.73 square meter and albuminuria creatinine ratio between 30-299 mg/g (HCC) 11/13/2021   Closed bilateral fracture of pubic rami (HCC) 08/28/2022   Diverticulosis    Focal seizures (HCC)    Heart murmur    Hematuria, gross 04/09/2018   Hemorrhoid    Hepatic cyst    innumerable   History of asbestos exposure    Hyperlipidemia    IBS (irritable bowel syndrome)    Idiopathic thrombocytopenic purpura (ITP) (HCC)    Migraine    MVP (mitral valve prolapse)    Near syncope 01/26/2023   Nodule of right lung    Osteoporosis of forearm    RBBB    Restrictive lung disease    Mild on PFT & likely cardiac in etiology    Right sided sciatica 04/10/2022   S/P Minimally invasive maze operation for atrial fibrillation 10/31/2015   Complete bilateral atrial lesion set using cryothermy and bipolar radiofrequency ablation with clipping of LA appendage via right mini thoracotomy approach   S/P minimally invasive mitral valve replacement with bioprosthetic valve 10/31/2015   33 mm [facility] Mitral bovine bioprosthetic tissue valve placed via right mini thoracotomy approach   Seizures (HCC)    left foot paralysis and left hand paralysis Dr. 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 INVASIVE CV LAB;  Service: Cardiovascular;  Laterality: N/A;   CARDIOVERSION N/A 08/22/2015   Procedure: CARDIOVERSION;  Surgeon: Vesta Mixer, MD;  Location: [facility] ENDOSCOPY;  Service: Cardiovascular;  Laterality: N/A;   COLONOSCOPY  2003   EP IMPLANTABLE DEVICE N/A 11/06/2015   Procedure: Pacemaker Implant;  Surgeon: Marinus Maw, MD;  Location: [facility] INVASIVE CV LAB;  Service: Cardiovascular;  Laterality: N/A;   EP IMPLANTABLE DEVICE N/A 02/20/2016   Procedure: Lead Extraction;  Surgeon: Marinus Maw, MD;  Location: [facility] INVASIVE CV LAB;  Service: Cardiovascular;  Laterality: N/A;   ESOPHAGOGASTRODUODENOSCOPY (EGD) WITH PROPOFOL N/A 05/19/2023   Procedure: ESOPHAGOGASTRODUODENOSCOPY (EGD) WITH PROPOFOL;  Surgeon: Midge Minium, MD;  Location:  [facility] ENDOSCOPY;  Service: Endoscopy;  Laterality: N/A;   EYE SURGERY Right 2013   FEMUR IM NAIL Left 06/18/2021   Procedure: INTRAMEDULLARY (IM) NAIL FEMORAL, OPEN REDUCTION INTERNAL FIXATION FEMUR RIGHT LITTLE FINGER PIP CLOSED REDUCTION;  Surgeon: Myrene Galas, MD;  Location: [facility] OR;  Service: Orthopedics;  Laterality: Left;   FOOT SURGERY     ~2007 right foot bunion   MANDIBLE FRACTURE SURGERY  03/26/2013   MINIMALLY INVASIVE MAZE PROCEDURE N/A 10/31/2015    Procedure: MINIMALLY INVASIVE MAZE PROCEDURE;  Surgeon: Purcell Nails, MD;  Location: [facility] OR;  Service: Open Heart Surgery;  Laterality: N/A;   MITRAL VALVE REPLACEMENT Right 10/31/2015   Procedure: MINIMALLY INVASIVE MITRAL VALVE (MV) REPLACEMENT;  Surgeon: Purcell Nails, MD;  Location: [facility] OR;  Service: Open Heart Surgery;  Laterality: Right;   PACEMAKER LEAD REMOVAL  02/20/2016   TEE WITH CARDIOVERSION     TEE WITHOUT CARDIOVERSION N/A 08/22/2015   Procedure: TRANSESOPHAGEAL ECHOCARDIOGRAM (TEE);  Surgeon: Vesta Mixer, MD;  Location: [facility] ENDOSCOPY;  Service: Cardiovascular;  Laterality: N/A;   TEE WITHOUT CARDIOVERSION N/A 10/31/2015   Procedure: TRANSESOPHAGEAL ECHOCARDIOGRAM (TEE);  Surgeon: Purcell Nails, MD;  Location: [facility] OR;  Service: Open Heart Surgery;  Laterality: N/A;   TUBAL LIGATION     VARICOSE VEIN SURGERY Right    HPI:  Pt is a 82 y.o. female with medical history significant of Advanced Dementia, PAFoff Eliquis secondary to frequent falls and major GI bleed, chronic dysphagia, seizure disorder, CKD stage III, orthostatic hypotension on midodrine, anxiety/depression sent from nursing home for evaluation of frequent falls, decreased oral intake and altered mentations.  She hit her head in a recent fall.  Family endorses Baseline Dysphagia and that pt has used both thickened liquids and a Dysphagia drink cup (lost at facility) d/t her dysphagia.   CT of Chest: Lungs/Pleura: Bilateral upper lobe predominant patchy and streaky  pulmonary opacities consistent with pneumonia. There is trace right  pleural effusion. No pneumothorax. The central airways are patent.   Head CT: No evidence of an acute intracranial abnormality.  2. Forehead hematoma.    Assessment / Plan / Recommendation  Clinical Impression   Pt seen for BSE today. Pt w/ Baseline dx'd Dementia; behaviors c/b agitation and fidgitiness, wanting to get out of bed. Noted verbal perseverations. When she did not want the po  trials, she spit them out on self and SLP. Mitts on; Sitter present. Pt was Talkative the entire time. OF NOTE: Pt has Baseline report of Dysphagia w/ use of both Nectar liquids and a Dysphagia drink cup at her Facility(per Family report). ALSO, pt's current Imaging indicates Pulmonary opacities c/w pneumonia. On [place] O2 2L; afebrile. WBC elevated.   Pt appears to present w/ oropharyngeal phase dysphagia in setting of declined Cognitive status; Baseline Advanced Dementia w/ behavioral issues. This can impact her overall awareness/timing of swallow and safety during po tasks which increases risk for aspiration, choking. Pt has a Baseline of Dysphagia at her Facility per report.  Pt's risk for aspiration appears to be reduced when following general aspiration precautions, given full feeding support/Supervision, and when using a Dysphagia diet consistency w/ Nectar liquids as has been used at her Facility in the past.    Pt consumed only few trials of ice chip, purees, softened solid(1 - expectorated), and Nectar liquids via Cup w/ No overt clinical s/s of aspiration noted: no decline in vocal quality, no cough, and no decline in respiratory status during/post  trials. O2 sats remained 98%. Oral phase was adequate for bolus management and oral clearing of the boluses given, and swallowed. MAX support and guidance/cues required d/t significant Cognitive decline. Only brief attention given to po trials w/ this SLP b/f she became min more agitation -- evaluation stopped to reduce over-stimulation. OM Exam was cursory as pt did not follow OM instructions but appeared [facility] w/ No unilateral weakness noted during bolus management and oral clearing.      D/t pt's Baseline Cognitive decline, Dementia as well as current acuity of illness and agitation being in the hospital, her risk for aspiration/aspiration pneumonia is increased. Recommend initiation of the dysphagia level 1(PUREE) w/ Nectar liquids via Cup; general  aspiration precautions; reduce Distractions during meals and engage pt during po's at meal for self-feeding. Pills Crushed in Puree for safer swallowing. Support w/ feeding at meals. Do not give po's if not calm. MD/NSG updated.   ST services recommends follow w/ Palliative Care for GOC and education re: impact of Cognitive decline/Dementia on swallowing and overall intake.  Recommend ST services to follow pt at Discharge post Acuity of illness at her Facility for further education and trials to upgrade diet consistency as indicated -- in setting of known Dysphagia. Largely suspect that pt's Cognitive decline could hamper upgrade of diet. Precautions posted in room; chart. NSG agreed. MD agreed. SLP Visit Diagnosis: Dysphagia, oropharyngeal phase (R13.12) (in setting of advanced Dementia; agitation)    Aspiration Risk  Moderate aspiration risk;Risk for inadequate nutrition/hydration    Diet Recommendation   Nectar;Dysphagia 1 (puree) = dysphagia level 1(PUREE) w/ Nectar liquids via Cup; general aspiration precautions; reduce Distractions during meals and engage pt during po's at meal for self-feeding. Support w/ feeding at meals. Flavor foods w/ condiments, gravies. Do not give po's if not calm.  Medication Administration: Crushed with puree    Other  Recommendations Recommended Consults:  (Dietician f/u; Palliative Care f/u for GOC) Oral Care Recommendations: Oral care BID;Staff/trained caregiver to provide oral care Caregiver Recommendations: Avoid jello, ice cream, thin soups, popsicles;Remove water pitcher;Have oral suction available    Recommendations for follow up therapy are one component of a multi-disciplinary discharge planning process, led by the attending physician.  Recommendations may be updated based on patient status, additional functional criteria and insurance authorization.  Follow up Recommendations Follow physician's recommendations for discharge plan and follow up therapies  (at her Facility)      Assistance Recommended at Discharge  FULL  Functional Status Assessment Patient has had a recent decline in their functional status and/or demonstrates limited ability to make significant improvements in function in a reasonable and predictable amount of time  Frequency and Duration  (n/a)   (n/a)       Prognosis Prognosis for improved oropharyngeal function: Guarded Barriers to Reach Goals: Language deficits;Cognitive deficits;Motivation;Time post onset;Severity of deficits;Behavior Barriers/Prognosis Comment: advanced Dementia; agitation/refusal behaviors      Swallow Study   General Date of Onset: 10/28/23 HPI: Pt is a 82 y.o. female with medical history significant of Advanced Dementia, PAFoff Eliquis secondary to frequent falls and major GI bleed, chronic dysphagia, seizure disorder, CKD stage III, orthostatic hypotension on midodrine, anxiety/depression sent from nursing home for evaluation of frequent falls, decreased oral intake and altered mentations.  She hit her head in a recent fall.  Family endorses Baseline Dysphagia and that pt has used both thickened liquids and a Dysphagia drink cup (lost at facility) d/t her dysphagia.   CT of Chest: Lungs/Pleura: Bilateral  upper lobe predominant patchy and streaky  pulmonary opacities consistent with pneumonia. There is trace right  pleural effusion. No pneumothorax. The central airways are patent.   Head CT: No evidence of an acute intracranial abnormality.  2. Forehead hematoma. Type of Study: Bedside Swallow Evaluation Previous Swallow Assessment: seen at her Facility by ST services for dysphagia per report Diet Prior to this Study: NPO Temperature Spikes Noted: No (wbc 14.2) Respiratory Status: Nasal cannula (2L) History of Recent Intubation: No Behavior/Cognition: Alert;Confused;Agitated;Impulsive;Uncooperative;Distractible;Requires cueing;Doesn't follow directions (Mitts on; Sitter+) Oral Cavity Assessment:   (CNT) Oral Care Completed by SLP: Yes (attempted) Oral Cavity - Dentition: Adequate natural dentition (appeared) Vision:  (n/a) Self-Feeding Abilities: Total assist Patient Positioning: Upright in bed (required positioning support) Baseline Vocal Quality: Normal Volitional Cough: Cognitively unable to elicit Volitional Swallow: Unable to elicit    Oral/Motor/Sensory Function Overall Oral Motor/Sensory Function: Within functional limits (No unilateral oral weakness noted w/ bolus management)   Ice Chips Ice chips: Within functional limits Presentation: Spoon (fed; 1 trial)   Thin Liquid Thin Liquid: Not tested    Nectar Thick Nectar Thick Liquid: Within functional limits Presentation: Cup (3 trials; fed) Other Comments: "I don't like that"   Honey Thick Honey Thick Liquid: Not tested   Puree Puree: Within functional limits Presentation: Spoon (fed; 3 trials)   Solid     Solid: Impaired Presentation: Spoon (fed; 1 trial) Oral Phase Impairments: Poor awareness of bolus (then expectorated bolus) Other Comments: pt munched on the bolus then expectorated it into the bed, on SLP        Jerilynn Som, MS, CCC-SLP Speech Language Pathologist Rehab Services; [facility] [phone] (ascom) Biridiana Twardowski 10/29/2023,3:18 PM

## 2023-10-30 DIAGNOSIS — F03918 Unspecified dementia, unspecified severity, with other behavioral disturbance: Secondary | ICD-10-CM | POA: Diagnosis not present

## 2023-10-30 DIAGNOSIS — J690 Pneumonitis due to inhalation of food and vomit: Secondary | ICD-10-CM | POA: Diagnosis not present

## 2023-10-30 DIAGNOSIS — G9340 Encephalopathy, unspecified: Secondary | ICD-10-CM | POA: Diagnosis not present

## 2023-10-30 DIAGNOSIS — W19XXXA Unspecified fall, initial encounter: Secondary | ICD-10-CM | POA: Diagnosis not present

## 2023-10-30 MED ORDER — PANTOPRAZOLE SODIUM 40 MG PO TBEC: 40.0000 mg | DELAYED_RELEASE_TABLET | Freq: Every day | ORAL | Status: DC

## 2023-10-30 MED ORDER — PANTOPRAZOLE SODIUM 40 MG IV SOLR
40.0000 mg | Freq: Every day | INTRAVENOUS | Status: DC
  Administered 2023-10-30: 40 mg via INTRAVENOUS
  Filled 2023-10-30: qty 10

## 2023-10-30 NOTE — Progress Notes (Signed)
Patient becoming increasingly agitated and combative. Attempting to pull at lines and get out of bed. RN administered PRN Zyprexa 2.5 mg IM injection. Patient repositioned in bed, sitter at bedside.

## 2023-10-30 NOTE — Plan of Care (Signed)
  Problem: Clinical Measurements: Goal: Ability to maintain clinical measurements within normal limits will improve Outcome: Progressing Goal: Will remain free from infection Outcome: Progressing Goal: Diagnostic test results will improve Outcome: Progressing Goal: Respiratory complications will improve Outcome: Progressing Goal: Cardiovascular complication will be avoided Outcome: Progressing   Problem: Elimination: Goal: Will not experience complications related to bowel motility Outcome: Progressing Goal: Will not experience complications related to urinary retention Outcome: Progressing   Problem: Safety: Goal: Ability to remain free from injury will improve Outcome: Progressing Note: sitter   Problem: Skin Integrity: Goal: Risk for impaired skin integrity will decrease Outcome: Progressing   Problem: Respiratory: Goal: Ability to maintain a clear airway will improve Outcome: Progressing Goal: Levels of oxygenation will improve Outcome: Progressing Goal: Ability to maintain adequate ventilation will improve Outcome: Progressing   Problem: Activity: Goal: Ability to tolerate increased activity will improve Outcome: Progressing   Problem: Clinical Measurements: Goal: Ability to maintain a body temperature in the normal range will improve Outcome: Progressing   Problem: Respiratory: Goal: Ability to maintain adequate ventilation will improve Outcome: Progressing Goal: Ability to maintain a clear airway will improve Outcome: Progressing

## 2023-10-30 NOTE — Progress Notes (Signed)
[facility]  - [place] [facility]   PATIENT NAME: [name] [name]    MR#:  [id number]  DATE OF BIRTH:  1941/11/14  SUBJECTIVE:  sitter at bedside. No family during my evaluation. Patient given advanced dementia would not communicate much although much awake and alert. She is trying to take the nectar thick liquid from the sitter.  VITALS:  Blood pressure 110/74, pulse 70, temperature 97.6 F (36.4 C), resp. rate 16, height 5\' 5"  (1.651 m), weight 66 kg, SpO2 97%.  PHYSICAL EXAMINATION:  limited GENERAL:  82 y.o.-year-old patient with no acute distress.  LUNGS: Normal breath sounds bilaterally CARDIOVASCULAR: S1, S2 normal.   ABDOMEN: Soft, nontender, nondistended. EXTREMITIES: No  edema b/l.    NEUROLOGIC: nonfocal  patient is alert , at baseline has dementia  LABORATORY PANEL:  CBC Recent Labs  Lab 10/29/23 0412  WBC 14.2*  HGB 9.6*  HCT 30.2*  PLT 107*    Chemistries  Recent Labs  Lab 10/29/23 0412  NA 138  K 3.4*  CL 104  CO2 23  GLUCOSE 86  BUN 25*  CREATININE 1.52*  CALCIUM 8.6*   Cardiac Enzymes No results for input(s): "TROPONINI" in the last 168 hours. RADIOLOGY:  CT Chest Wo Contrast  Result Date: 10/28/2023 CLINICAL DATA:  Pneumonia complication suspected. EXAM: CT CHEST WITHOUT CONTRAST TECHNIQUE: Multidetector CT imaging of the chest was performed following the standard protocol without IV contrast. RADIATION DOSE REDUCTION: This exam was performed according to the departmental dose-optimization program which includes automated exposure control, adjustment of the mA and/or kV according to patient size and/or use of iterative reconstruction technique. COMPARISON:  Chest radiograph dated 10/28/2023 and CT dated 08/07/2017. FINDINGS: Evaluation of this exam is limited in the absence of intravenous contrast as well as due to respiratory motion. Cardiovascular: Moderate cardiomegaly. No pericardial effusion. There is coronary vascular  calcification and left pectoral pacemaker device. Mechanical mitral valve. Mild atherosclerotic calcification of the thoracic aorta. There is dilatation of the main pulmonary trunk suggestive of pulmonary hypertension. Mediastinum/Nodes: No obvious hilar or mediastinal adenopathy. The esophagus is grossly unremarkable. No mediastinal fluid collection. Lungs/Pleura: Bilateral upper lobe predominant patchy and streaky pulmonary opacities consistent with pneumonia. There is trace right pleural effusion. No pneumothorax. The central airways are patent. Upper Abdomen: Small liver cysts and additional hypodense lesions which are not characterized on this CT. There is mild biliary ductal dilatation or periportal edema. Musculoskeletal: Displaced healing subacute or old fracture of the body of the sternum with callus formation. There is osteopenia with degenerative changes of the spine. Several subacute or old appearing bilateral rib fractures. No definite acute osseous pathology. IMPRESSION: 1. Bilateral upper lobe predominant pneumonia. 2. Trace right pleural effusion. 3. Moderate cardiomegaly. 4.  Aortic Atherosclerosis (ICD10-I70.0). Electronically Signed   By: Elgie Collard M.D.   On: 10/28/2023 17:08   DG Chest 2 View  Result Date: 10/28/2023 CLINICAL DATA:  Shortness of breath. EXAM: CHEST - 2 VIEW COMPARISON:  04/29/2023. FINDINGS: Redemonstration of heterogeneous nonspecific opacities throughout bilateral lungs with right upper/mid lung zone predominance, essentially similar/slightly less conspicuous to the prior study. Findings may represent combination of atelectasis/scarring with superimposed alveolar opacities. Bilateral lungs are otherwise clear. There is mild pulmonary vascular congestion. Bilateral costophrenic angles are clear. Stable moderately enlarged cardio-mediastinal silhouette. There is a left sided 2-lead pacemaker. Left atrial appendage closure device and prosthetic mitral valve again seen.  No acute osseous abnormalities. The soft tissues are within normal limits. IMPRESSION: *Mild pulmonary  vascular congestion. *Redemonstration of heterogeneous nonspecific opacities throughout bilateral lungs with right upper/mid lung zone predominance, essentially similar/slightly less conspicuous to the prior study. Findings may represent combination of atelectasis/scarring with superimposed alveolar opacities which may be due to edema versus pneumonitis. Correlate clinically to determine the need for additional imaging with chest CT scan. Electronically Signed   By: Jules Schick M.D.   On: 10/28/2023 14:52    Assessment and Plan  [name] [name] is a 82 y.o. female with medical history significant of dementia, PAF off Eliquis secondary to frequent falls and major GI bleed, advanced dementia, chronic dysphagia, seizure disorder, CKD stage III, orthostatic hypotension on midodrine, anxiety/depression sent from nursing home for evaluation of frequent falls, decreased oral intake and altered mentations.   And patient sustained 2 falls in last 3 days. 1 episode was the weekend and patient fell on her hip and complained of pain but x-ray showed no fracture or dislocation. Today patient fell again and hit her head. No LOC   Chest CT showed scattered infiltrates and consolidation bilaterally peripheral compatible with multifocal pneumonia and implying chronic aspiration.   History of falls without any trauma -- imaging studies no fracture -- PT OT orders placed. Patient has advanced dementia and sure how much he will work will give it a try  Pneumonia appears aspiration given history of chronic dysphagia -- IV Unasyn -- patient did not cooperate with speech therapy--cont Dysphagia 1 diet with honey thick --at risk for dehydration  PAF -Paced rhythm -On low-dose of metoprolol -Decision was made earlier this year to remove Eliquis due to frequent falls and a major GI bleed this year.   Advanced  dementia -Have to hold off her routine pills until mentation improves -As needed Zyprexa for sundowning and agitation -- poor PO intake. Patient hard to redirect given advanced dementia. For safety will need sitter   CKD stage IIIb -Volume contracted, creatinine level stable, on IV fluid -- baseline creatinine 0.94-- 1.2 -- patient receiving IV fluid since she is not taking anything orally --check BMP in am     Family communication : left VM for son [name] Consults :none CODE STATUS: DNR DVT Prophylaxis : heparin Level of care: Telemetry Medical Status is: Inpatient Remains inpatient appropriate because: fall, pneumonia, agitation    TOTAL TIME TAKING CARE OF THIS PATIENT: 35 minutes.  >50% time spent on counselling and coordination of care  Note: This dictation was prepared with Dragon dictation along with smaller phrase technology. Any transcriptional errors that result from this process are unintentional.  Enedina Finner M.D    [facility]   CC: Primary care physician; Earnestine Mealing, MD

## 2023-10-31 DIAGNOSIS — W19XXXA Unspecified fall, initial encounter: Secondary | ICD-10-CM | POA: Diagnosis not present

## 2023-10-31 DIAGNOSIS — G9340 Encephalopathy, unspecified: Secondary | ICD-10-CM | POA: Diagnosis not present

## 2023-10-31 DIAGNOSIS — R627 Adult failure to thrive: Secondary | ICD-10-CM

## 2023-10-31 DIAGNOSIS — J690 Pneumonitis due to inhalation of food and vomit: Secondary | ICD-10-CM | POA: Diagnosis not present

## 2023-10-31 LAB — BASIC METABOLIC PANEL
Anion gap: 10 (ref 5–15)
BUN: 24 mg/dL — ABNORMAL HIGH (ref 8–23)
CO2: 27 mmol/L (ref 22–32)
Calcium: 9.2 mg/dL (ref 8.9–10.3)
Chloride: 108 mmol/L (ref 98–111)
Creatinine, Ser: 1.56 mg/dL — ABNORMAL HIGH (ref 0.44–1.00)
GFR, Estimated: 33 mL/min — ABNORMAL LOW (ref >60)
Glucose, Bld: 99 mg/dL (ref 70–99)
Potassium: 2.9 mmol/L — ABNORMAL LOW (ref 3.5–5.1)
Sodium: 144 mmol/L (ref 135–145)

## 2023-10-31 LAB — CBC
HCT: 26.5 % — ABNORMAL LOW (ref 36.0–46.0)
Hemoglobin: 8.7 g/dL — ABNORMAL LOW (ref 12.0–15.0)
MCH: 31 pg (ref 26.0–34.0)
MCHC: 32.8 g/dL (ref 30.0–36.0)
MCV: 94.3 fL (ref 80.0–100.0)
Platelets: 108 10*3/uL — ABNORMAL LOW (ref 150–400)
RBC: 2.81 MIL/uL — ABNORMAL LOW (ref 3.87–5.11)
RDW: 17.4 % — ABNORMAL HIGH (ref 11.5–15.5)
WBC: 14.1 10*3/uL — ABNORMAL HIGH (ref 4.0–10.5)
nRBC: 0 % (ref 0.0–0.2)

## 2023-10-31 MED ORDER — POTASSIUM CHLORIDE 10 MEQ/100ML IV SOLN
10.0000 meq | INTRAVENOUS | Status: AC
  Administered 2023-10-31 (×4): 10 meq via INTRAVENOUS
  Filled 2023-10-31 (×4): qty 100

## 2023-10-31 MED ORDER — PANTOPRAZOLE SODIUM 40 MG PO TBEC
40.0000 mg | DELAYED_RELEASE_TABLET | Freq: Every day | ORAL | Status: DC
  Administered 2023-11-02 – 2023-11-03 (×2): 40 mg via ORAL
  Filled 2023-10-31 (×2): qty 1

## 2023-10-31 NOTE — Plan of Care (Signed)
  Problem: Clinical Measurements: Goal: Ability to maintain clinical measurements within normal limits will improve Outcome: Progressing Goal: Will remain free from infection Outcome: Progressing Goal: Diagnostic test results will improve Outcome: Progressing Goal: Cardiovascular complication will be avoided Outcome: Progressing   Problem: Elimination: Goal: Will not experience complications related to bowel motility Outcome: Progressing Goal: Will not experience complications related to urinary retention Outcome: Progressing   Problem: Pain Management: Goal: General experience of comfort will improve Outcome: Progressing   Problem: Safety: Goal: Ability to remain free from injury will improve Outcome: Progressing   Problem: Respiratory: Goal: Ability to maintain a clear airway will improve Outcome: Progressing Goal: Levels of oxygenation will improve Outcome: Progressing Goal: Ability to maintain adequate ventilation will improve Outcome: Progressing   Problem: Clinical Measurements: Goal: Ability to maintain a body temperature in the normal range will improve Outcome: Progressing   Problem: Respiratory: Goal: Ability to maintain adequate ventilation will improve Outcome: Progressing Goal: Ability to maintain a clear airway will improve Outcome: Progressing

## 2023-10-31 NOTE — Progress Notes (Addendum)
Safety sitter discontinued  at 1045 per Dr. Allena Katz. Family currently at bedside.  10/31/23 1616  Safety Observation   Observer at bedside No  Additional Interventions Bed alarm activated;Other (patient has been solmnolent most of the day. Family at bedside. Patient alert at the moment but not verbal)

## 2023-10-31 NOTE — Progress Notes (Signed)
[facility]  -  at [facility]   PATIENT NAME: [name] [name]    MR#:  [id number]  DATE OF BIRTH:  1941-11-16  SUBJECTIVE:   No family during my evaluation. Spoke with patient's [name] on the phone. He plans to visit this afternoon and see if patient would take PO diet. Patient has been sleepy this morning. She opened her eyes to verbal command. Does not communicate. RN has not been able to give meds due to patient not communicating and remaining lethargic.  VITALS:  Blood pressure (!) 120/53, pulse 72, temperature 98.9 F (37.2 C), resp. rate 19, height 5\' 5"  (1.651 m), weight 66 kg, SpO2 96%.  PHYSICAL EXAMINATION:  limited GENERAL:  82 y.o.-year-old patient with no acute distress.  LUNGS: Normal breath sounds bilaterally CARDIOVASCULAR: S1, S2 normal.   EXTREMITIES: No  edema b/l.    NEUROLOGIC: unable to assess completely due to patient cooperation and participation. LABORATORY PANEL:  CBC Recent Labs  Lab 10/31/23 0554  WBC 14.1*  HGB 8.7*  HCT 26.5*  PLT 108*    Chemistries  Recent Labs  Lab 10/31/23 0554  NA 144  K 2.9*  CL 108  CO2 27  GLUCOSE 99  BUN 24*  CREATININE 1.56*  CALCIUM 9.2   Assessment and Plan  [name] [name] is a 82 y.o. female with medical history significant of dementia, PAF off Eliquis secondary to frequent falls and major GI bleed, advanced dementia, chronic dysphagia, seizure disorder, CKD stage III, orthostatic hypotension on midodrine, anxiety/depression sent from nursing home for evaluation of frequent falls, decreased oral intake and altered mentations.   And patient sustained 2 falls in last 3 days. 1 episode was the weekend and patient fell on her hip and complained of pain but x-ray showed no fracture or dislocation. Today patient fell again and hit her head. No LOC   Chest CT showed scattered infiltrates and consolidation bilaterally peripheral compatible with multifocal pneumonia and implying chronic  aspiration.   History of falls without any trauma -- imaging studies no fracture -- PT OT orders placed. Patient has advanced dementia and sure how much he will work will give it a try  Pneumonia appears aspiration given history of chronic dysphagia -- IV Unasyn -- patient did not cooperate with speech therapy--cont Dysphagia 1 diet with honey thick for now --at risk for dehydration  PAF -Paced rhythm -On low-dose of metoprolol -Decision was made earlier this year to remove Eliquis due to frequent falls and a major GI bleed this year.   Cognitive decline/?dementia -Have to hold off her routine pills until mentation improves -- poor PO intake. Patient hard to redirect given cognitive decline/dementia --Calm today/ d/c sitter for now--use as needed   CKD stage IIIb Hypokalemia -Volume contracted, creatinine level stable, on IV fluid -- baseline creatinine 0.94-- 1.2 -- patient receiving IV fluid since she is not taking anything orally -- pharmacy to replete potassium  Failure to thrive, poor PO intake, malnourished -- long conversation with [name] on the phone. He describes these kind of episodes before which improved with electrolyte replacement and antibiotic. Discussed with son patient is already on electrolyte replacement and antibiotics but so far has not shown any improvement. -- Overall has a poor prognosis at present given poor PO intake malnourishment risk for infection, aspiration, dehydration. -- Patient has been under hospice care before. Discussed with son if shows no signs of improvement should consider hospice. -- Plans to come this afternoon to  see if patient interacts with him     Family communication : spoke with [name] Consults :none CODE STATUS: DNR DVT Prophylaxis : heparin Level of care: Telemetry Medical Status is: Inpatient Remains inpatient appropriate because: fall, pneumonia,FTT    TOTAL TIME TAKING CARE OF THIS PATIENT: 35 minutes.  >50%  time spent on counselling and coordination of care  Note: This dictation was prepared with Dragon dictation along with smaller phrase technology. Any transcriptional errors that result from this process are unintentional.  Enedina Finner M.D    [facility]   CC: Primary care physician; Earnestine Mealing, MD

## 2023-10-31 NOTE — Progress Notes (Signed)
10/31/23 1019 10/31/23 1100 10/31/23 1220  Vitals  Temp 98.9 F (37.2 C)  --  99 F (37.2 C)  Temp Source Axillary  --  Axillary  BP (!) 120/53  --  (!) 102/58  MAP (mmHg) 73  --  70  BP Location Left Arm  --  Left Arm  BP Method Automatic  --  Automatic  Patient Position (if appropriate) Lying  --  Lying  Pulse Rate 72  --  70  Pulse Rate Source  --   --  Monitor  Resp 19  --  16  Level of Consciousness  Level of Consciousness Responds to Pain (somnulent, difficult to arouse, clinching fist, respoded to pain)  (somnulent, difficult to arouse, clinching fist, respoded to pain) Responds to Pain  MEWS COLOR  MEWS Score Color Yellow  --  Yellow  Oxygen Therapy  SpO2 96 %  --  95 %  O2 Device Nasal Cannula  --  Nasal Cannula  O2 Flow Rate (L/min) 3 L/min  --  3 L/min  Pain Assessment  Pain Scale PAINAD  --   --   Pain Score Asleep  --   --   PCA/Epidural/Spinal Assessment  Respiratory Pattern  --  Unlabored  --   Glasgow Coma Scale  Eye Opening  --  2  --   Best Verbal Response (NON-intubated)  --  1  --   Best Motor Response  --  4  --   Glasgow Coma Scale Score  --  7  --   MEWS Score  MEWS Temp 0  --  0  MEWS Systolic 0  --  0  MEWS Pulse 0  --  0  MEWS RR 0  --  0  MEWS LOC 2  --  2  MEWS Score 2  --  2  Provider Notification  Provider Name/Title Dr. Enedina Finner  --  Dr. Enedina Finner  Date Provider Notified 10/31/23  --  10/31/23  Time Provider Notified 1045  --  1245  Method of Notification Page (secure chat)  --  Face-to-face  Notification Reason Other (Comment) [employer] MEWS, patient was non-verbal, somnolent, responding to pain)  --  Other (Comment) (unchange in status, MD in speaking with the patient's son, RN made provider aware of  VS and status, unchanged from previous assessment)  Provider response No new orders;En route;Other (Comment) (Provider made contact with son, no new order otherthan IV potassium supplement, RN made provider aware that not able to  give patient any PO medication because patient is not awake and concerned for further aspiration. Per MD, awaiting son to come visit.)  --  At bedside  Date of Provider Response 10/31/23  --  10/31/23  Time of Provider Response 1054  --  1245    10/31/23 1410 10/31/23 1624  Vitals  Temp 99 F (37.2 C) 98.8 F (37.1 C)  Temp Source  --   --   BP 116/60 121/60  MAP (mmHg) 75 77  BP Location Left Arm Left Arm  BP Method Automatic Automatic  Patient Position (if appropriate) Lying Lying  Pulse Rate 70 70  Pulse Rate Source Monitor  --   Resp 17 17  Level of Consciousness  Level of Consciousness Responds to Voice Alert  MEWS COLOR  MEWS Score Color Riki Sheer  Oxygen Therapy  SpO2 95 % 94 %  O2 Device Nasal Cannula  --   O2 Flow Rate (L/min) 3 L/min  --  Pain Assessment  Pain Scale  --   --   Pain Score  --   --   PCA/Epidural/Spinal Assessment  Respiratory Pattern  --   --   Glasgow Coma Scale  Eye Opening  --   --   Best Verbal Response (NON-intubated)  --   --   Best Motor Response  --   --   Glasgow Coma Scale Score  --   --   MEWS Score  MEWS Temp 0 0  MEWS Systolic 0 0  MEWS Pulse 0 0  MEWS RR 0 0  MEWS LOC 1 0  MEWS Score 1 0  Provider Notification  Provider Name/Title  (Dr Allena Katz)  --   Date Provider Notified 10/31/23  --   Time Provider Notified 1640  --   Method of Notification Page (provided an update via secure chat patient is at green MEWS)  --   Notification Reason Change in status  --   Provider response No new orders  --   Date of Provider Response 10/31/23  --   Time of Provider Response 1648  --

## 2023-10-31 NOTE — TOC Initial Note (Signed)
Transition of Care [facility]) - Initial/Assessment Note    Patient Details  Name: [name] [name] MRN: [id number] Date of Birth: 10/26/1941  [facility]) CM/SW Contact:    Allena Katz, LCSW Phone Number: 10/31/2023, 10:33 AM  Clinical Narrative:   Pt admitted from [facility]. PT has recommended SNF for patient. CSW has left VM with andrea at [facility] to confirm pt can return.                 Expected Discharge Plan: Skilled Nursing Facility Barriers to Discharge: Continued Medical Work up   Patient Goals and CMS Choice Patient states their goals for this hospitalization and ongoing recovery are:: return to [facility]          Expected Discharge Plan and Services     Post Acute Care Choice: Nursing Home                                        Prior Living Arrangements/Services   Lives with:: Facility Resident          Need for Family Participation in Patient Care: Yes (Comment)        Activities of Daily Living   ADL Screening (condition at time of admission) Independently performs ADLs?: No Does the patient have a NEW difficulty with bathing/dressing/toileting/self-feeding that is expected to last >3 days?: Yes (Initiates electronic notice to provider for possible OT consult) Does the patient have a NEW difficulty with getting in/out of bed, walking, or climbing stairs that is expected to last >3 days?: Yes (Initiates electronic notice to provider for possible PT consult) Does the patient have a NEW difficulty with communication that is expected to last >3 days?: Yes (Initiates electronic notice to provider for possible SLP consult) Is the patient deaf or have difficulty hearing?: No Does the patient have difficulty seeing, even when wearing glasses/contacts?: No Does the patient have difficulty concentrating, remembering, or making decisions?: Yes  Permission Sought/Granted                  Emotional Assessment        Orientation: : Fluctuating Orientation (Suspected and/or reported Sundowners)      Admission diagnosis:  Aspiration pneumonia (HCC) [J69.0] Acute encephalopathy [G93.40] Sepsis (HCC) [A41.9] Aspiration pneumonia of both lungs, unspecified aspiration pneumonia type, unspecified part of lung (HCC) [J69.0] Patient Active Problem List   Diagnosis Date Noted   Dementia with behavioral disturbance (HCC) 10/29/2023   Aspiration pneumonia (HCC) 10/28/2023   Aortoiliac occlusive disease (HCC) 07/17/2023   Behavior concern 06/12/2023   Coffee ground emesis 05/18/2023   Upper GI bleed 05/17/2023   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   IDA (iron deficiency anemia) 04/03/2023   Sepsis (HCC) 01/26/2023   Broken shoulder, left, closed, initial encounter 11/24/2022   Polypharmacy 08/28/2022   Candidiasis of anus 08/28/2022   Right groin pain 06/26/2022   Fall 06/22/2022   Lumbar radiculitis 05/09/2022   Right sided sciatica 04/10/2022   Anemia of chronic disease 04/02/2022   DDD (degenerative disc disease), lumbar 03/31/2022   Spondylosis 03/31/2022   At high risk for falls 03/31/2022   Chronic kidney disease (CKD) stage G3a/A2, moderately decreased glomerular filtration rate (GFR) between 45-59 mL/min/1.73 square meter and albuminuria creatinine ratio between 30-299 mg/g (HCC) 11/13/2021   Do not resuscitate status 08/14/2021   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   PAF (paroxysmal atrial fibrillation) (HCC) 06/19/2021   Osteoporosis 06/19/2021   Acute encephalopathy 06/19/2021   Hyperlipidemia 06/19/2021   Atypical fracture of femur 06/17/2021   Concern about end of life 04/24/2021   Chronic bilateral low back pain without sciatica 04/12/2021   Fall at home, initial encounter 04/12/2021   Acquired thrombophilia (HCC) 06/14/2020   Carotid stenosis 04/21/2020   Inguinal hernia of right side without obstruction or gangrene 02/09/2020   Seizure-like  activity (HCC) 01/10/2020   Sinus node dysfunction (HCC) 07/02/2019   Intracranial atherosclerosis 10/06/2018   Focal seizures (HCC) 05/14/2018   Arthritis of both hands 12/14/2017   Allergy to environmental factors 08/27/2017   Anemia in chronic kidney disease 02/17/2017   Visit for preventive health examination 05/23/2016   Hepatic cyst 05/04/2016   Chronic systolic congestive heart failure (HCC) 05/03/2016   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   H/O mitral valve replacement 10/31/2015   S/P Minimally invasive maze operation for atrial fibrillation 10/31/2015   Atrial fibrillation, persistent (HCC)    Restrictive lung disease 09/12/2015   Pulmonary hypertension (HCC) 08/08/2015   Nodule of right lung 08/04/2015   Thoracic aorta atherosclerosis (HCC) 02/05/2015   Family history of cerebral aneurysm 08/02/2014   Diverticulosis of colon without hemorrhage 01/09/2014   Hospital discharge follow-up 04/15/2013   Thrombocytopenia (HCC) 02/04/2013   IBS (irritable bowel syndrome) 12/31/2012   History of asbestos exposure 06/27/2012   Cataract extraction status of right eye 06/25/2012   RBBB 05/05/2012   Mitral valve prolapse 05/02/2011   PCP:  Earnestine Mealing, MD Pharmacy:   [facility] - [place], [place] - [address] [place] [address] Phone: [phone] Fax: [phone]     Social Determinants of Health (SDOH) Social History: SDOH Screenings   Food Insecurity: No Food Insecurity (10/29/2023)  Housing: Patient Declined (10/29/2023)  Transportation Needs: No Transportation Needs (10/29/2023)  Utilities: Not At Risk (10/29/2023)  Depression (PHQ2-9): Low Risk  (06/26/2023)  Recent Concern: Depression (PHQ2-9) - High Risk (05/08/2023)  Financial Resource Strain: Low Risk  (03/17/2023)  Physical Activity: Unknown (03/17/2023)  Social Connections: Unknown (03/17/2023)  Stress: No Stress Concern Present (03/17/2023)   Tobacco Use: Low Risk  (10/28/2023)   SDOH Interventions:     Readmission Risk Interventions     No data to display

## 2023-10-31 NOTE — Progress Notes (Signed)
PT Cancellation Note  Patient Details Name: DARRYN BROMBERG MRN: [id number] DOB: 02-24-1941   Cancelled Treatment:     Therapist in this pm, son at bedside. Pt very lethargic however woke briefly when spoken to stating "Tell them to stop recording". Once a little more oriented to awaking, pt able to recall son's name appropriately and turn head in his direction at request. Pt however is not able to maintain arousal in order to tolerate skilled PT this date. Will re-attempt PT per POC as appropriate.   Jannet Askew 10/31/2023, 3:46 PM

## 2023-10-31 NOTE — Plan of Care (Signed)
Problem: Education: Goal: Knowledge of General Education information will improve Description: Including pain rating scale, medication(s)/side effects and non-pharmacologic comfort measures 10/31/2023 0535 by Birdie Sons, RN Outcome: Progressing 10/31/2023 0532 by Birdie Sons, RN Outcome: Not Progressing 10/31/2023 0532 by Birdie Sons, RN Outcome: Not Progressing 10/31/2023 0325 by Birdie Sons, RN Outcome: Not Progressing   Problem: Health Behavior/Discharge Planning: Goal: Ability to manage health-related needs will improve 10/31/2023 0535 by Birdie Sons, RN Outcome: Progressing 10/31/2023 0532 by Birdie Sons, RN Outcome: Not Progressing 10/31/2023 0532 by Birdie Sons, RN Outcome: Not Progressing 10/31/2023 0325 by Birdie Sons, RN Outcome: Not Progressing   Problem: Clinical Measurements: Goal: Ability to maintain clinical measurements within normal limits will improve 10/31/2023 0535 by Birdie Sons, RN Outcome: Progressing 10/31/2023 0532 by Birdie Sons, RN Outcome: Progressing 10/31/2023 0532 by Birdie Sons, RN Outcome: Progressing 10/31/2023 0325 by Birdie Sons, RN Outcome: Progressing Goal: Will remain free from infection 10/31/2023 0535 by Birdie Sons, RN Outcome: Progressing 10/31/2023 0532 by Birdie Sons, RN Outcome: Progressing 10/31/2023 0532 by Birdie Sons, RN Outcome: Progressing 10/31/2023 0325 by Birdie Sons, RN Outcome: Progressing Goal: Diagnostic test results will improve 10/31/2023 0535 by Birdie Sons, RN Outcome: Progressing 10/31/2023 0532 by Birdie Sons, RN Outcome: Progressing 10/31/2023 0532 by Birdie Sons, RN Outcome: Progressing 10/31/2023 0325 by Birdie Sons, RN Outcome: Progressing Goal: Respiratory complications will improve 10/31/2023 0535 by Birdie Sons, RN Outcome: Progressing 10/31/2023 0532 by Birdie Sons, RN Outcome: Not  Progressing 10/31/2023 0532 by Birdie Sons, RN Outcome: Progressing 10/31/2023 0325 by Birdie Sons, RN Outcome: Not Progressing Goal: Cardiovascular complication will be avoided 10/31/2023 0535 by Birdie Sons, RN Outcome: Progressing 10/31/2023 0532 by Birdie Sons, RN Outcome: Progressing 10/31/2023 0532 by Birdie Sons, RN Outcome: Progressing 10/31/2023 0325 by Birdie Sons, RN Outcome: Progressing   Problem: Activity: Goal: Risk for activity intolerance will decrease 10/31/2023 0535 by Birdie Sons, RN Outcome: Progressing 10/31/2023 0532 by Birdie Sons, RN Outcome: Not Progressing 10/31/2023 0532 by Birdie Sons, RN Outcome: Not Progressing 10/31/2023 0325 by Birdie Sons, RN Outcome: Not Progressing   Problem: Nutrition: Goal: Adequate nutrition will be maintained 10/31/2023 0535 by Birdie Sons, RN Outcome: Progressing 10/31/2023 0532 by Birdie Sons, RN Outcome: Not Progressing 10/31/2023 0532 by Birdie Sons, RN Outcome: Not Progressing 10/31/2023 0325 by Birdie Sons, RN Outcome: Not Progressing   Problem: Coping: Goal: Level of anxiety will decrease 10/31/2023 0535 by Birdie Sons, RN Outcome: Progressing 10/31/2023 0532 by Birdie Sons, RN Outcome: Not Progressing 10/31/2023 0532 by Birdie Sons, RN Outcome: Progressing 10/31/2023 0325 by Birdie Sons, RN Outcome: Not Progressing   Problem: Elimination: Goal: Will not experience complications related to bowel motility 10/31/2023 0535 by Birdie Sons, RN Outcome: Progressing 10/31/2023 0532 by Birdie Sons, RN Outcome: Progressing 10/31/2023 0532 by Birdie Sons, RN Outcome: Not Progressing 10/31/2023 0325 by Birdie Sons, RN Outcome: Progressing Goal: Will not experience complications related to urinary retention 10/31/2023 0535 by Birdie Sons, RN Outcome: Progressing 10/31/2023 0532 by Birdie Sons, RN Outcome: Not  Progressing 10/31/2023 0532 by Birdie Sons, RN Outcome: Not Progressing 10/31/2023 0325 by Birdie Sons, RN Outcome: Progressing   Problem: Pain Management: Goal: General experience of comfort will improve 10/31/2023 0535 by Birdie Sons, RN Outcome:  Progressing 10/31/2023 0532 by Birdie Sons, RN Outcome: Progressing 10/31/2023 0532 by Birdie Sons, RN Outcome: Not Progressing 10/31/2023 0325 by Birdie Sons, RN Outcome: Progressing   Problem: Safety: Goal: Ability to remain free from injury will improve 10/31/2023 0535 by Birdie Sons, RN Outcome: Progressing 10/31/2023 0532 by Birdie Sons, RN Outcome: Progressing 10/31/2023 0532 by Birdie Sons, RN Outcome: Not Progressing 10/31/2023 0325 by Birdie Sons, RN Outcome: Progressing   Problem: Skin Integrity: Goal: Risk for impaired skin integrity will decrease 10/31/2023 0535 by Birdie Sons, RN Outcome: Progressing 10/31/2023 0532 by Birdie Sons, RN Outcome: Not Progressing 10/31/2023 0532 by Birdie Sons, RN Outcome: Not Progressing 10/31/2023 0325 by Birdie Sons, RN Outcome: Not Progressing   Problem: Education: Goal: Knowledge of disease or condition will improve 10/31/2023 0535 by Birdie Sons, RN Outcome: Progressing 10/31/2023 0532 by Birdie Sons, RN Outcome: Not Progressing 10/31/2023 0532 by Birdie Sons, RN Outcome: Not Progressing 10/31/2023 0325 by Birdie Sons, RN Outcome: Not Progressing Goal: Knowledge of the prescribed therapeutic regimen will improve 10/31/2023 0535 by Birdie Sons, RN Outcome: Progressing 10/31/2023 0532 by Birdie Sons, RN Outcome: Not Progressing 10/31/2023 0532 by Birdie Sons, RN Outcome: Not Progressing 10/31/2023 0325 by Birdie Sons, RN Outcome: Not Progressing Goal: Individualized Educational Video(s) 10/31/2023 0535 by Birdie Sons, RN Outcome: Progressing 10/31/2023 0532 by Birdie Sons, RN Outcome: Not Progressing 10/31/2023 0532 by Birdie Sons, RN Outcome: Not Progressing 10/31/2023 0325 by Birdie Sons, RN Outcome: Not Progressing   Problem: Activity: Goal: Ability to tolerate increased activity will improve 10/31/2023 0535 by Birdie Sons, RN Outcome: Progressing 10/31/2023 0532 by Birdie Sons, RN Outcome: Not Progressing 10/31/2023 0532 by Birdie Sons, RN Outcome: Not Progressing 10/31/2023 0325 by Birdie Sons, RN Outcome: Not Progressing Goal: Will verbalize the importance of balancing activity with adequate rest periods 10/31/2023 0535 by Birdie Sons, RN Outcome: Progressing 10/31/2023 0532 by Birdie Sons, RN Outcome: Not Progressing 10/31/2023 0532 by Birdie Sons, RN Outcome: Not Progressing 10/31/2023 0325 by Birdie Sons, RN Outcome: Not Progressing   Problem: Respiratory: Goal: Ability to maintain a clear airway will improve 10/31/2023 0535 by Birdie Sons, RN Outcome: Progressing 10/31/2023 0532 by Birdie Sons, RN Outcome: Progressing 10/31/2023 0532 by Birdie Sons, RN Outcome: Not Progressing 10/31/2023 0325 by Birdie Sons, RN Outcome: Progressing Goal: Levels of oxygenation will improve 10/31/2023 0535 by Birdie Sons, RN Outcome: Progressing 10/31/2023 0532 by Birdie Sons, RN Outcome: Progressing 10/31/2023 0532 by Birdie Sons, RN Outcome: Not Progressing 10/31/2023 0325 by Birdie Sons, RN Outcome: Progressing Goal: Ability to maintain adequate ventilation will improve 10/31/2023 0535 by Birdie Sons, RN Outcome: Progressing 10/31/2023 0532 by Birdie Sons, RN Outcome: Not Progressing 10/31/2023 0532 by Birdie Sons, RN Outcome: Not Progressing 10/31/2023 0325 by Birdie Sons, RN Outcome: Progressing   Problem: Activity: Goal: Ability to tolerate increased activity will improve 10/31/2023 0535 by Birdie Sons, RN Outcome:  Progressing 10/31/2023 0532 by Birdie Sons, RN Outcome: Not Progressing 10/31/2023 0532 by Birdie Sons, RN Outcome: Not Progressing 10/31/2023 0325 by Birdie Sons, RN Outcome: Not Progressing   Problem: Clinical Measurements: Goal: Ability to maintain a body temperature in the normal range will improve 10/31/2023 0535 by Birdie Sons, RN Outcome: Progressing 10/31/2023 0532 by Birdie Sons,  RN Outcome: Progressing 10/31/2023 0532 by Birdie Sons, RN Outcome: Not Progressing 10/31/2023 0325 by Birdie Sons, RN Outcome: Progressing   Problem: Respiratory: Goal: Ability to maintain adequate ventilation will improve 10/31/2023 0535 by Birdie Sons, RN Outcome: Progressing 10/31/2023 0532 by Birdie Sons, RN Outcome: Progressing 10/31/2023 0532 by Birdie Sons, RN Outcome: Not Progressing 10/31/2023 0325 by Birdie Sons, RN Outcome: Progressing Goal: Ability to maintain a clear airway will improve 10/31/2023 0535 by Birdie Sons, RN Outcome: Progressing 10/31/2023 0532 by Birdie Sons, RN Outcome: Progressing 10/31/2023 0532 by Birdie Sons, RN Outcome: Not Progressing 10/31/2023 0325 by Birdie Sons, RN Outcome: Progressing

## 2023-10-31 NOTE — TOC Progression Note (Signed)
Transition of Care [facility]) - Progression Note    Patient Details  Name: DENAYAH SHERRICK MRN: [id number] Date of Birth: [birth year]  [facility]) CM/SW Contact  Allena Katz, LCSW Phone Number: 10/31/2023, 1:52 PM  Clinical Narrative:   CSW spoke with andrea from [facility]. Pt was memory care but recently moved to LTC. [name] states pt was active with hospice but stopped services on 11/8. [name] is able to take patient tomorrow if medically ready.    Expected Discharge Plan: Skilled Nursing Facility Barriers to Discharge: Continued Medical Work up  Expected Discharge Plan and Services     Post Acute Care Choice: Nursing Home                                         Social Determinants of Health (SDOH) Interventions SDOH Screenings   Food Insecurity: No Food Insecurity (10/29/2023)  Housing: Patient Declined (10/29/2023)  Transportation Needs: No Transportation Needs (10/29/2023)  Utilities: Not At Risk (10/29/2023)  Depression (PHQ2-9): Low Risk  (06/26/2023)  Recent Concern: Depression (PHQ2-9) - High Risk (05/08/2023)  Financial Resource Strain: Low Risk  (03/17/2023)  Physical Activity: Unknown (03/17/2023)  Social Connections: Unknown (03/17/2023)  Stress: No Stress Concern Present (03/17/2023)  Tobacco Use: Low Risk  (10/28/2023)    Readmission Risk Interventions     No data to display

## 2023-10-31 NOTE — Consult Note (Signed)
PHARMACY CONSULT NOTE - ELECTROLYTES  Pharmacy Consult for Electrolyte Monitoring and Replacement   Recent Labs: Potassium (mmol/L)  Date Value  10/31/2023 2.9 (L)   Magnesium (mg/dL)  Date Value  32/95/1884 2.3   Calcium (mg/dL)  Date Value  16/60/6301 9.2   Albumin (g/dL)  Date Value  60/09/9322 4.2   Phosphorus (mg/dL)  Date Value  55/73/2202 3.1   Sodium  Date Value  10/31/2023 144 mmol/L  10/13/2023 142   Height: 5\' 5"  (165.1 cm) Weight: 66 kg (145 lb 8.1 oz) IBW/kg (Calculated) : 57 Estimated Creatinine Clearance: 24.6 mL/min (A) (by C-G formula based on SCr of 1.56 mg/dL (H)).  Assessment  [name] [name] is a 82 y.o. female presenting with aspiration pneumonia. PMH significant for advanced dementia. Pharmacy has been consulted to monitor and replace electrolytes.  Diet: Dysphagia 1 MIVF: N/A Pertinent medications: N/A  Goal of Therapy: Electrolytes within normal limits  Plan:  K 2.9, Kcl 10 mEq IV x 5 doses Follow-up electrolytes in AM  Thank you for allowing pharmacy to be a part of this patient's care.  Tressie Ellis 10/31/2023 10:53 AM

## 2023-10-31 NOTE — Plan of Care (Signed)
  Problem: Education: Goal: Knowledge of General Education information will improve Description: Including pain rating scale, medication(s)/side effects and non-pharmacologic comfort measures Outcome: Progressing   Problem: Health Behavior/Discharge Planning: Goal: Ability to manage health-related needs will improve Outcome: Progressing   Problem: Clinical Measurements: Goal: Ability to maintain clinical measurements within normal limits will improve Outcome: Progressing Goal: Will remain free from infection Outcome: Progressing Goal: Diagnostic test results will improve Outcome: Progressing Goal: Respiratory complications will improve Outcome: Progressing Goal: Cardiovascular complication will be avoided Outcome: Progressing   Problem: Nutrition: Goal: Adequate nutrition will be maintained Outcome: Progressing   Problem: Coping: Goal: Level of anxiety will decrease Outcome: Progressing   Problem: Elimination: Goal: Will not experience complications related to bowel motility Outcome: Progressing Goal: Will not experience complications related to urinary retention Outcome: Progressing   Problem: Safety: Goal: Ability to remain free from injury will improve Outcome: Progressing   Problem: Skin Integrity: Goal: Risk for impaired skin integrity will decrease Outcome: Progressing   Problem: Education: Goal: Knowledge of disease or condition will improve Outcome: Progressing Goal: Knowledge of the prescribed therapeutic regimen will improve Outcome: Progressing Goal: Individualized Educational Video(s) Outcome: Progressing   Problem: Activity: Goal: Ability to tolerate increased activity will improve Outcome: Progressing Goal: Will verbalize the importance of balancing activity with adequate rest periods Outcome: Progressing   Problem: Respiratory: Goal: Ability to maintain a clear airway will improve Outcome: Progressing Goal: Levels of oxygenation will  improve Outcome: Progressing Goal: Ability to maintain adequate ventilation will improve Outcome: Progressing

## 2023-10-31 NOTE — Progress Notes (Signed)
   10/31/23 1100  Integumentary  Integumentary (WDL) WDL  Skin Color Appropriate for ethnicity  Skin Integrity Abrasion;Ecchymosis  Abrasion Location Back;Hip;Knee  Abrasion Location Orientation Left;Right;Lower  Ecchymosis Location Back;Head;Hip  Ecchymosis Location Orientation Right;Left;Lower;Upper  Erythema/Redness Location Buttocks (placed mepilex to sacrum, redness noted, blanchable)  Erythema/Redness Location Orientation Left;Right  Location of Hematoma right forehead  Skin Turgor Non-tenting  Sacral Foam Prophylactic Dressing  Dressing Interventions Dressing placed and dated/protocol started  Medical Device Prophylactic Dressing  Skin Assessed Under All Medical Device Prophylactic Dressings (if applicable) Other (Comment) (sacrum with redness, blanchable, place mepilex foam to area for prevention)   Turning and offloading patient q2h

## 2023-10-31 NOTE — Plan of Care (Signed)
  Problem: Clinical Measurements: Goal: Ability to maintain clinical measurements within normal limits will improve 10/31/2023 0532 by Birdie Sons, RN Outcome: Progressing 10/31/2023 0532 by Birdie Sons, RN Outcome: Progressing 10/31/2023 0325 by Birdie Sons, RN Outcome: Progressing Goal: Will remain free from infection 10/31/2023 0532 by Birdie Sons, RN Outcome: Progressing 10/31/2023 0532 by Birdie Sons, RN Outcome: Progressing 10/31/2023 0325 by Birdie Sons, RN Outcome: Progressing Goal: Diagnostic test results will improve 10/31/2023 0532 by Birdie Sons, RN Outcome: Progressing 10/31/2023 0532 by Birdie Sons, RN Outcome: Progressing 10/31/2023 0325 by Birdie Sons, RN Outcome: Progressing Goal: Cardiovascular complication will be avoided 10/31/2023 0532 by Birdie Sons, RN Outcome: Progressing 10/31/2023 0532 by Birdie Sons, RN Outcome: Progressing 10/31/2023 0325 by Birdie Sons, RN Outcome: Progressing   Problem: Elimination: Goal: Will not experience complications related to bowel motility 10/31/2023 0532 by Birdie Sons, RN Outcome: Progressing 10/31/2023 0532 by Birdie Sons, RN Outcome: Not Progressing 10/31/2023 0325 by Birdie Sons, RN Outcome: Progressing   Problem: Pain Management: Goal: General experience of comfort will improve 10/31/2023 0532 by Birdie Sons, RN Outcome: Progressing 10/31/2023 0532 by Birdie Sons, RN Outcome: Not Progressing 10/31/2023 0325 by Birdie Sons, RN Outcome: Progressing   Problem: Safety: Goal: Ability to remain free from injury will improve 10/31/2023 0532 by Birdie Sons, RN Outcome: Progressing 10/31/2023 0532 by Birdie Sons, RN Outcome: Not Progressing 10/31/2023 0325 by Birdie Sons, RN Outcome: Progressing   Problem: Respiratory: Goal: Ability to maintain a clear airway will improve 10/31/2023 0532 by Birdie Sons, RN Outcome:  Progressing 10/31/2023 0532 by Birdie Sons, RN Outcome: Not Progressing 10/31/2023 0325 by Birdie Sons, RN Outcome: Progressing Goal: Levels of oxygenation will improve 10/31/2023 0532 by Birdie Sons, RN Outcome: Progressing 10/31/2023 0532 by Birdie Sons, RN Outcome: Not Progressing 10/31/2023 0325 by Birdie Sons, RN Outcome: Progressing   Problem: Clinical Measurements: Goal: Ability to maintain a body temperature in the normal range will improve 10/31/2023 0532 by Birdie Sons, RN Outcome: Progressing 10/31/2023 0532 by Birdie Sons, RN Outcome: Not Progressing 10/31/2023 0325 by Birdie Sons, RN Outcome: Progressing   Problem: Respiratory: Goal: Ability to maintain adequate ventilation will improve 10/31/2023 0532 by Birdie Sons, RN Outcome: Progressing 10/31/2023 0532 by Birdie Sons, RN Outcome: Not Progressing 10/31/2023 0325 by Birdie Sons, RN Outcome: Progressing Goal: Ability to maintain a clear airway will improve 10/31/2023 0532 by Birdie Sons, RN Outcome: Progressing 10/31/2023 0532 by Birdie Sons, RN Outcome: Not Progressing 10/31/2023 0325 by Birdie Sons, RN Outcome: Progressing

## 2023-11-01 DIAGNOSIS — J690 Pneumonitis due to inhalation of food and vomit: Secondary | ICD-10-CM | POA: Diagnosis not present

## 2023-11-01 DIAGNOSIS — W19XXXA Unspecified fall, initial encounter: Secondary | ICD-10-CM | POA: Diagnosis not present

## 2023-11-01 DIAGNOSIS — G9340 Encephalopathy, unspecified: Secondary | ICD-10-CM | POA: Diagnosis not present

## 2023-11-01 DIAGNOSIS — R627 Adult failure to thrive: Secondary | ICD-10-CM | POA: Diagnosis not present

## 2023-11-01 LAB — BASIC METABOLIC PANEL
Anion gap: 15 (ref 5–15)
BUN: 27 mg/dL — ABNORMAL HIGH (ref 8–23)
CO2: 21 mmol/L — ABNORMAL LOW (ref 22–32)
Calcium: 9.4 mg/dL (ref 8.9–10.3)
Chloride: 109 mmol/L (ref 98–111)
Creatinine, Ser: 1.52 mg/dL — ABNORMAL HIGH (ref 0.44–1.00)
GFR, Estimated: 34 mL/min — ABNORMAL LOW (ref >60)
Glucose, Bld: 72 mg/dL (ref 70–99)
Potassium: 3 mmol/L — ABNORMAL LOW (ref 3.5–5.1)
Sodium: 145 mmol/L (ref 135–145)

## 2023-11-01 LAB — PHOSPHORUS: Phosphorus: 2.7 mg/dL (ref 2.5–4.6)

## 2023-11-01 LAB — MAGNESIUM: Magnesium: 2.5 mg/dL — ABNORMAL HIGH (ref 1.7–2.4)

## 2023-11-01 MED ORDER — POTASSIUM CHLORIDE CRYS ER 20 MEQ PO TBCR
40.0000 meq | EXTENDED_RELEASE_TABLET | Freq: Once | ORAL | Status: DC
  Filled 2023-11-01: qty 2

## 2023-11-01 MED ORDER — POTASSIUM CHLORIDE 10 MEQ/100ML IV SOLN
10.0000 meq | INTRAVENOUS | Status: AC
  Administered 2023-11-01 (×2): 10 meq via INTRAVENOUS
  Filled 2023-11-01 (×2): qty 100

## 2023-11-01 MED ORDER — POTASSIUM CHLORIDE 10 MEQ/100ML IV SOLN
10.0000 meq | INTRAVENOUS | Status: AC
Start: 2023-11-01 — End: 2023-11-01
  Administered 2023-11-01 (×2): 10 meq via INTRAVENOUS
  Filled 2023-11-01: qty 100

## 2023-11-01 MED ORDER — POTASSIUM CHLORIDE 10 MEQ/100ML IV SOLN
10.0000 meq | INTRAVENOUS | Status: AC
  Administered 2023-11-01: 10 meq via INTRAVENOUS
  Filled 2023-11-01: qty 100

## 2023-11-01 NOTE — Plan of Care (Signed)
  Problem: Education: Goal: Knowledge of General Education information will improve Description: Including pain rating scale, medication(s)/side effects and non-pharmacologic comfort measures Outcome: Progressing   Problem: Health Behavior/Discharge Planning: Goal: Ability to manage health-related needs will improve Outcome: Progressing   Problem: Clinical Measurements: Goal: Ability to maintain clinical measurements within normal limits will improve Outcome: Progressing Goal: Will remain free from infection Outcome: Progressing Goal: Diagnostic test results will improve Outcome: Progressing Goal: Respiratory complications will improve Outcome: Progressing Goal: Cardiovascular complication will be avoided Outcome: Progressing   Problem: Safety: Goal: Ability to remain free from injury will improve Outcome: Progressing   Problem: Skin Integrity: Goal: Risk for impaired skin integrity will decrease Outcome: Progressing   Problem: Elimination: Goal: Will not experience complications related to bowel motility Outcome: Progressing Goal: Will not experience complications related to urinary retention Outcome: Progressing

## 2023-11-01 NOTE — Progress Notes (Signed)
[facility]  - [place] at [facility]   PATIENT NAME: [name] [name]    MR#:  [id number]  DATE OF BIRTH:  May 31, 1941  SUBJECTIVE:   No family during my evaluation. Spoke with patient's daughter [name] on the phone this morning. Patient has her eyes squeezed and frowning. She would not respond to verbal commands. Has not eaten anything. Keeps her eyes close does not communicate. Makes placed to secure IV and oxygen nasal cannula. VITALS:  Blood pressure (!) 145/81, pulse 70, temperature 98.5 F (36.9 C), resp. rate (!) 24, height 5\' 5"  (1.651 m), weight 66 kg, SpO2 94%.  PHYSICAL EXAMINATION:  limited GENERAL:  82 y.o.-year-old patient with no acute distress.  LUNGS: Normal breath sounds bilaterally CARDIOVASCULAR: S1, S2 normal.   NEUROLOGIC: unable to assess completely due to patient cooperation and participation. Patient is nonverbal LABORATORY PANEL:  CBC Recent Labs  Lab 10/31/23 0554  WBC 14.1*  HGB 8.7*  HCT 26.5*  PLT 108*    Chemistries  Recent Labs  Lab 11/01/23 0437  NA 145  K 3.0*  CL 109  CO2 21*  GLUCOSE 72  BUN 27*  CREATININE 1.52*  CALCIUM 9.4  MG 2.5*   Assessment and Plan  [name] [name] is a 82 y.o. female with medical history significant of dementia, PAF off Eliquis secondary to frequent falls and major GI bleed, advanced dementia, chronic dysphagia, seizure disorder, CKD stage III, orthostatic hypotension on midodrine, anxiety/depression sent from nursing home for evaluation of frequent falls, decreased oral intake and altered mentations.   And patient sustained 2 falls in last 3 days. 1 episode was the weekend and patient fell on her hip and complained of pain but x-ray showed no fracture or dislocation. Today patient fell again and hit her head. No LOC   Chest CT showed scattered infiltrates and consolidation bilaterally peripheral compatible with multifocal pneumonia and implying chronic aspiration.   Acute metabolic  encephalopathy likely due to cognitive decline/?dementia -Have to hold off her routine pills until mentation improves -- poor PO intake. Patient hard to redirect given cognitive decline/dementia -- patient is nonverbal. Does not communicate, no PO intake, unable to take PO meds.   History of falls without any trauma -- imaging studies no fracture  Pneumonia appears aspiration given history of chronic dysphagia -- IV Unasyn x 5 days -- patient did not cooperate with speech therapy--cont Dysphagia 1 diet with honey thick for now --at risk for dehydration  PAF -Paced rhythm -On low-dose of metoprolol -Decision was made earlier this year to remove Eliquis due to frequent falls and a major GI bleed this year.   CKD stage IIIb Hypokalemia -Volume contracted, creatinine level stable, on IV fluid -- baseline creatinine 0.94-- 1.2--1.5 -- patient received IV fluids during hospitalization -- pharmacy to replete potassium  Failure to thrive, poor PO intake, malnourished -- long conversation with son [name] on the phone. He describes these kind of episodes before which improved with electrolyte replacement and antibiotic. Discussed with son patient is already on electrolyte replacement and antibiotics but so far has not shown any improvement. -- Overall has a poor prognosis at present given poor PO intake malnourishment risk for infection, aspiration, dehydration. -- Patient has been under hospice care before. Discussed with son if shows no signs of improvement should consider hospice.  11/30-- spoke with patient's daughter [name] on the phone. She understands overall poor prognosis with her mom. She is in agreement with patient returning back to [facility]  with hospice. TOC reached out to admissions coordinator at [facility] and they are in agreement with patient returning back on Monday. They are aware patient is not eating and drinking and nonverbal no communication.     Family communication  : spoke with daughter [name] CODE STATUS: DNR/DNI DVT Prophylaxis : heparin Level of care: Telemetry Medical Status is: Inpatient Remains inpatient appropriate because: safe discharge to [facility] on Monday with hospice    TOTAL TIME TAKING CARE OF THIS PATIENT: 35 minutes.  >50% time spent on counselling and coordination of care  Note: This dictation was prepared with Dragon dictation along with smaller phrase technology. Any transcriptional errors that result from this process are unintentional.  [name] [name] M.D    [facility]   CC: Primary care physician; Earnestine Mealing, MD

## 2023-11-01 NOTE — TOC Progression Note (Signed)
Transition of Care [facility]) - Progression Note    Patient Details  Name: [name] [name] MRN: [id number] Date of Birth: October 01, 1941  [facility] of [facility]) CM/SW Contact  Susa Simmonds, [place] Phone Number: 11/01/2023, 9:20 AM  Clinical Narrative:  CSW spoke with [name] [name] with admissions at [facility]. CSW notified [name] [name] that patient is currently not eating or drinking and wanted to verify if patient can return. [name] [name] stated patient can return with hospice but would like to wait until Monday. [name] [name] stated that their hospice team is not available over the weekend and it would be better to discharge on Monday when hospice is available.  CSW also contacted patients daughter [name] [name] to update her. Patients daughter agrees with plan.     Expected Discharge Plan: Skilled Nursing Facility Barriers to Discharge: Continued Medical Work up  Expected Discharge Plan and Services     Post Acute Care Choice: Nursing Home                                         Social Determinants of Health (SDOH) Interventions SDOH Screenings   Food Insecurity: No Food Insecurity (10/29/2023)  Housing: Patient Declined (10/29/2023)  Transportation Needs: No Transportation Needs (10/29/2023)  Utilities: Not At Risk (10/29/2023)  Depression (PHQ2-9): Low Risk  (06/26/2023)  Recent Concern: Depression (PHQ2-9) - High Risk (05/08/2023)  Financial Resource Strain: Low Risk  (03/17/2023)  Physical Activity: Unknown (03/17/2023)  Social Connections: Unknown (03/17/2023)  Stress: No Stress Concern Present (03/17/2023)  Tobacco Use: Low Risk  (10/28/2023)    Readmission Risk Interventions     No data to display

## 2023-11-01 NOTE — Plan of Care (Signed)
  Problem: Education: Goal: Knowledge of General Education information will improve Description: Including pain rating scale, medication(s)/side effects and non-pharmacologic comfort measures Outcome: Adequate for Discharge   Problem: Clinical Measurements: Goal: Ability to maintain clinical measurements within normal limits will improve Outcome: Adequate for Discharge Goal: Will remain free from infection Outcome: Adequate for Discharge Goal: Diagnostic test results will improve Outcome: Adequate for Discharge Goal: Respiratory complications will improve Outcome: Adequate for Discharge Goal: Cardiovascular complication will be avoided Outcome: Adequate for Discharge   Problem: Activity: Goal: Risk for activity intolerance will decrease Outcome: Adequate for Discharge   Problem: Elimination: Goal: Will not experience complications related to bowel motility Outcome: Adequate for Discharge Goal: Will not experience complications related to urinary retention Outcome: Adequate for Discharge   Problem: Pain Management: Goal: General experience of comfort will improve Outcome: Adequate for Discharge

## 2023-11-01 NOTE — Progress Notes (Signed)
SLP Cancellation Note  Patient Details Name: [name] [name] MRN: [id number] DOB: 1941/06/28   Cancelled treatment:       Reason Eval/Treat Not Completed:  (chart reviewed; consulted MD re: pt's status and presentation)  Per MD, pt is not eating and not responding to others -- she "squeezes her eyes closed does not communicate". Pt has a Baseline of Significant Dementia per MD, chart.  POC is now focused on possible return to her SNF w/ Hospice care per [facility] note, MD.  No further Acute ST services indicated at this time w/ pt's current behavioral/Cognitive presentation. Recommend continue the dysphagia diet as per chart w/ aspiration precautions and 100% supervision if attempting any oral intake w/ pt. F/u at her next venue of care is recommended if appropriate per MD.  MD agreed.     Jerilynn Som, MS, CCC-SLP Speech Language Pathologist Rehab Services; [facility] [phone] (ascom) Amiylah Anastos 11/01/2023, 2:11 PM

## 2023-11-01 NOTE — Consult Note (Signed)
PHARMACY CONSULT NOTE - ELECTROLYTES  Pharmacy Consult for Electrolyte Monitoring and Replacement   Recent Labs: Potassium (mmol/L)  Date Value  11/01/2023 3.0 (L)   Magnesium (mg/dL)  Date Value  40/98/1191 2.5 (H)   Calcium (mg/dL)  Date Value  47/82/9562 9.4   Albumin (g/dL)  Date Value  13/07/6577 4.2   Phosphorus (mg/dL)  Date Value  46/96/2952 2.7   Sodium  Date Value  11/01/2023 145 mmol/L  10/13/2023 142   Height: 5\' 5"  (165.1 cm) Weight: 66 kg (145 lb 8.1 oz) IBW/kg (Calculated) : 57 Estimated Creatinine Clearance: 25.2 mL/min (A) (by C-G formula based on SCr of 1.52 mg/dL (H)).  Assessment  [name] [name] is a 82 y.o. female presenting with aspiration pneumonia. PMH significant for advanced dementia. Pharmacy has been consulted to monitor and replace electrolytes.  Diet: Dysphagia 1 MIVF: N/A Pertinent medications: N/A  Goal of Therapy: Electrolytes within normal limits  Plan:  K 3.0, Kcl 10 mEq IV x 4 + Kcl 40 mEq x 1 dose Follow-up electrolytes in AM  Thank you for allowing pharmacy to be a part of this patient's care.  Bettey Costa, PharmD Clinical Pharmacist 11/01/2023 7:34 AM

## 2023-11-02 DIAGNOSIS — J690 Pneumonitis due to inhalation of food and vomit: Secondary | ICD-10-CM | POA: Diagnosis not present

## 2023-11-02 LAB — CULTURE, BLOOD (ROUTINE X 2)
Culture: NO GROWTH
Culture: NO GROWTH

## 2023-11-02 NOTE — Plan of Care (Signed)
  Problem: Clinical Measurements: Goal: Ability to maintain clinical measurements within normal limits will improve Outcome: Progressing Goal: Respiratory complications will improve Outcome: Progressing   Problem: Nutrition: Goal: Adequate nutrition will be maintained Outcome: Progressing   Problem: Skin Integrity: Goal: Risk for impaired skin integrity will decrease Outcome: Progressing

## 2023-11-02 NOTE — Progress Notes (Signed)
PT DISCHARGE Note  Patient Details Name: [name] [name] MRN: [id number] DOB: 05-09-1941   Cancelled Treatment:    Reason Eval/Treat Not Completed: PT screened, no needs identified, will sign off (Per chart, pt not eating or drinking. Plan has been updated for pt to return to [facility] under hospice care. WIll sign off at this time.)   Renato Spellman C 11/02/2023, 12:06 PM

## 2023-11-02 NOTE — Progress Notes (Signed)
[facility]  - [place] at [facility]   PATIENT NAME: [name] [name]    MR#:  [id number]  DATE OF BIRTH:  August 30, 1941  SUBJECTIVE:   No family during my evaluation. Mitts+ Pt awake but will keep eyes closed. Unable to have any meaningful conversation. "I want to preserve my eye" they will get bad, I will open on at a time" confused  VITALS:  Blood pressure 106/60, pulse 70, temperature 97.6 F (36.4 C), temperature source Oral, resp. rate 20, height 5\' 5"  (1.651 m), weight 66 kg, SpO2 94%.  PHYSICAL EXAMINATION:  limited GENERAL:  82 y.o.-year-old patient with no acute distress. confused LUNGS: Normal breath sounds bilaterally CARDIOVASCULAR: S1, S2 normal.   NEUROLOGIC: unable to assess completely due to patient cooperation and participation. Patient is nonverbal LABORATORY PANEL:  CBC Recent Labs  Lab 10/31/23 0554  WBC 14.1*  HGB 8.7*  HCT 26.5*  PLT 108*    Chemistries  Recent Labs  Lab 11/01/23 0437  NA 145  K 3.0*  CL 109  CO2 21*  GLUCOSE 72  BUN 27*  CREATININE 1.52*  CALCIUM 9.4  MG 2.5*   Assessment and Plan  [name] [name] is a 82 y.o. female with medical history significant of dementia, PAF off Eliquis secondary to frequent falls and major GI bleed, advanced dementia, chronic dysphagia, seizure disorder, CKD stage III, orthostatic hypotension on midodrine, anxiety/depression sent from nursing home for evaluation of frequent falls, decreased oral intake and altered mentations.   And patient sustained 2 falls in last 3 days. 1 episode was the weekend and patient fell on her hip and complained of pain but x-ray showed no fracture or dislocation. Today patient fell again and hit her head. No LOC   Chest CT showed scattered infiltrates and consolidation bilaterally peripheral compatible with multifocal pneumonia and implying chronic aspiration.   Acute metabolic encephalopathy likely due to cognitive decline/dementia -Have to hold off  her routine pills until mentation improves -- poor PO intake. Patient hard to redirect given cognitive decline/dementia -- patient is nonverbal. Does not communicate, no PO intake, unable to take PO meds.   History of falls without any trauma -- imaging studies no fracture  Pneumonia appears aspiration given history of chronic dysphagia -- IV Unasyn x 5 days -- patient did not cooperate with speech therapy--cont Dysphagia 1 diet with honey thick for now --at risk for dehydration  PAF -Paced rhythm -On low-dose of metoprolol -Decision was made earlier this year to remove Eliquis due to frequent falls and a major GI bleed this year.   CKD stage IIIb Hypokalemia -Volume contracted, creatinine level stable, on IV fluid -- baseline creatinine 0.94-- 1.2--1.5 -- patient received IV fluids during hospitalization -- pharmacy to replete potassium  Failure to thrive, poor PO intake, malnourished -- long conversation with [name] on the phone. He describes these kind of episodes before which improved with electrolyte replacement and antibiotic. Discussed with son patient is already on electrolyte replacement and antibiotics but so far has not shown any improvement. -- Overall has a poor prognosis at present given poor PO intake malnourishment risk for infection, aspiration, dehydration. -- Patient has been under hospice care before. Discussed with son if shows no signs of improvement should consider hospice.  11/30-- spoke with patient's daughter [name] [name] on the phone. She understands overall poor prognosis with her mom. She is in agreement with patient returning back to [facility] with hospice. TOC reached out to admissions coordinator at [facility] and they are in agreement with patient returning back on Monday. They are aware patient is not eating and drinking and nonverbal no communication.     Family communication : none today CODE STATUS: DNR/DNI DVT Prophylaxis : heparin Status is:  Inpatient Remains inpatient appropriate because: safe discharge to [facility] on Monday with hospice    TOTAL TIME TAKING CARE OF THIS PATIENT: 35 minutes.  >50% time spent on counselling and coordination of care  Note: This dictation was prepared with Dragon dictation along with smaller phrase technology. Any transcriptional errors that result from this process are unintentional.  Enedina Finner M.D    [facility]   CC: Primary care physician; [name] Mealing, MD

## 2023-11-03 ENCOUNTER — Telehealth: Payer: Self-pay | Admitting: Student

## 2023-11-03 DIAGNOSIS — J690 Pneumonitis due to inhalation of food and vomit: Secondary | ICD-10-CM | POA: Diagnosis not present

## 2023-11-03 DIAGNOSIS — Z515 Encounter for palliative care: Secondary | ICD-10-CM

## 2023-11-03 LAB — BASIC METABOLIC PANEL
Anion gap: 10 (ref 5–15)
BUN: 38 mg/dL — ABNORMAL HIGH (ref 8–23)
CO2: 27 mmol/L (ref 22–32)
Calcium: 9.3 mg/dL (ref 8.9–10.3)
Chloride: 113 mmol/L — ABNORMAL HIGH (ref 98–111)
Creatinine, Ser: 1.32 mg/dL — ABNORMAL HIGH (ref 0.44–1.00)
GFR, Estimated: 40 mL/min — ABNORMAL LOW (ref >60)
Glucose, Bld: 114 mg/dL — ABNORMAL HIGH (ref 70–99)
Potassium: 3.1 mmol/L — ABNORMAL LOW (ref 3.5–5.1)
Sodium: 150 mmol/L — ABNORMAL HIGH (ref 135–145)

## 2023-11-03 MED ORDER — BISACODYL 10 MG RE SUPP: 10.0000 mg | RECTAL | 0 refills | Status: DC | PRN

## 2023-11-03 MED ORDER — SENNA 8.6 MG PO TABS: 1.0000 | ORAL_TABLET | Freq: Every day | ORAL | 0 refills | Status: DC

## 2023-11-03 NOTE — [place] FL2 (Cosign Needed Addendum)
[place] MEDICAID [id number] LEVEL OF CARE FORM     IDENTIFICATION  Patient Name: [name] [name] Birthdate: [birth year] Sex: female Admission Date (Current Location): 10/28/2023  [facility] and [place] Number:  Chiropodist and Address:  [facility], [address] [place], [place] [address]      Provider Number: [id number]  Attending Physician Name and Address:  Enedina Finner, MD  Relative Name and Phone Number:  yesha, wisener [phone])  [phone] (M    Current Level of Care: Hospital Recommended Level of Care: LTC(With hospice) Prior Approval Number:    Date Approved/Denied:   PASRR Number: [id number]  Discharge Plan:      Current Diagnoses: Patient Active Problem List   Diagnosis Date Noted   Dementia with behavioral disturbance (HCC) 10/29/2023   Aspiration pneumonia (HCC) 10/28/2023   Aortoiliac occlusive disease (HCC) 07/17/2023   Behavior concern 06/12/2023   Coffee ground emesis 05/18/2023   Upper GI bleed 05/17/2023   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   IDA (iron deficiency anemia) 04/03/2023   Sepsis (HCC) 01/26/2023   Broken shoulder, left, closed, initial encounter 11/24/2022   Polypharmacy 08/28/2022   Candidiasis of anus 08/28/2022   Right groin pain 06/26/2022   Fall 06/22/2022   Lumbar radiculitis 05/09/2022   Right sided sciatica 04/10/2022   Anemia of chronic disease 04/02/2022   DDD (degenerative disc disease), lumbar 03/31/2022   Spondylosis 03/31/2022   At high risk for falls 03/31/2022   Chronic kidney disease (CKD) stage G3a/A2, moderately decreased glomerular filtration rate (GFR) between 45-59 mL/min/1.73 square meter and albuminuria creatinine ratio between 30-299 mg/g (HCC) 11/13/2021   Do not resuscitate status 08/14/2021   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   PAF (paroxysmal atrial fibrillation) (HCC) 06/19/2021   Osteoporosis 06/19/2021   Acute encephalopathy  06/19/2021   Hyperlipidemia 06/19/2021   Atypical fracture of femur 06/17/2021   Concern about end of life 04/24/2021   Chronic bilateral low back pain without sciatica 04/12/2021   Fall at home, initial encounter 04/12/2021   Acquired thrombophilia (HCC) 06/14/2020   Carotid stenosis 04/21/2020   Inguinal hernia of right side without obstruction or gangrene 02/09/2020   Seizure-like activity (HCC) 01/10/2020   Sinus node dysfunction (HCC) 07/02/2019   Intracranial atherosclerosis 10/06/2018   Focal seizures (HCC) 05/14/2018   Arthritis of both hands 12/14/2017   Allergy to environmental factors 08/27/2017   Anemia in chronic kidney disease 02/17/2017   Visit for preventive health examination 05/23/2016   Hepatic cyst 05/04/2016   Chronic systolic congestive heart failure (HCC) 05/03/2016   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   H/O mitral valve replacement 10/31/2015   S/P Minimally invasive maze operation for atrial fibrillation 10/31/2015   Atrial fibrillation, persistent (HCC)    Restrictive lung disease 09/12/2015   Pulmonary hypertension (HCC) 08/08/2015   Nodule of right lung 08/04/2015   Thoracic aorta atherosclerosis (HCC) 02/05/2015   Family history of cerebral aneurysm 08/02/2014   Diverticulosis of colon without hemorrhage 01/09/2014   Hospital discharge follow-up 04/15/2013   Thrombocytopenia (HCC) 02/04/2013   IBS (irritable bowel syndrome) 12/31/2012   History of asbestos exposure 06/27/2012   Cataract extraction status of right eye 06/25/2012   RBBB 05/05/2012   Mitral valve prolapse 05/02/2011    Orientation RESPIRATION BLADDER Height & Weight     Self, Place    Incontinent Weight: 145 lb 8.1 oz (66 kg) Height:  5\' 5"  (165.1 cm)  BEHAVIORAL SYMPTOMS/MOOD NEUROLOGICAL BOWEL NUTRITION STATUS  Incontinent    AMBULATORY STATUS COMMUNICATION OF NEEDS Skin   Extensive Assist Verbally Normal                       Personal Care Assistance  Level of Assistance  Bathing, Dressing, Feeding Bathing Assistance: Maximum assistance Feeding assistance: Maximum assistance Dressing Assistance: Maximum assistance     Functional Limitations Info  Sight, Hearing, Speech Sight Info: Adequate Hearing Info: Adequate      SPECIAL CARE FACTORS FREQUENCY                       Contractures Contractures Info: Not present    Additional Factors Info  Code Status, Allergies Code Status Info: DNR- limited Allergies Info: Codeine  Epinephrine  Hydrocodone  Hydromorphone  Molds & Smuts  Oxycodone  Meloxicam  Codeine  Dextromethorphan  Diphen (Diphenhydramine Hcl)  Diphenhydramine  Diphenhydramine Hcl  Diphenylpyraline  Doxycycline  Doxycycline  Hydrocodone  Hydromorphone  Meloxicam  Mobic (Meloxicam)  Nsaids  Nsaids  Oxycodone  Propofol  Propofol           Current Medications (11/03/2023):  This is the current hospital active medication list Current Facility-Administered Medications  Medication Dose Route Frequency Provider Last Rate Last Admin   acetaminophen (TYLENOL) tablet 650 mg  650 mg Oral Q6H PRN Mikey College T, MD       Or   acetaminophen (TYLENOL) suppository 650 mg  650 mg Rectal Q6H PRN Mikey College T, MD       albuterol (PROVENTIL) (2.5 MG/3ML) 0.083% nebulizer solution 2.5 mg  2.5 mg Nebulization Q6H PRN Mikey College T, MD   2.5 mg at 10/30/23 2205   ARIPiprazole (ABILIFY) tablet 10 mg  10 mg Oral QPM Mikey College T, MD   10 mg at 11/02/23 1746   fluticasone (FLONASE) 50 MCG/ACT nasal spray 2 spray  2 spray Each Nare Daily Mikey College T, MD   2 spray at 11/02/23 1003   gabapentin (NEURONTIN) capsule 200 mg  200 mg Oral TID Mikey College T, MD   200 mg at 11/02/23 2137   guaiFENesin (MUCINEX) 12 hr tablet 600 mg  600 mg Oral BID Mikey College T, MD   300 mg at 11/02/23 2139   heparin injection 5,000 Units  5,000 Units Subcutaneous Q12H Mikey College T, MD   5,000 Units at 11/02/23 2143   lamoTRIgine (LAMICTAL) tablet 150 mg  150  mg Oral Daily Mikey College T, MD   150 mg at 11/02/23 0955   lamoTRIgine (LAMICTAL) tablet 200 mg  200 mg Oral QHS Mikey College T, MD   200 mg at 11/02/23 2133   meclizine (ANTIVERT) tablet 12.5 mg  12.5 mg Oral Q12H PRN Mikey College T, MD       midodrine (PROAMATINE) tablet 2.5 mg  2.5 mg Oral TID WC Mikey College T, MD   2.5 mg at 11/02/23 1743   ondansetron (ZOFRAN) tablet 4 mg  4 mg Oral Q6H PRN Mikey College T, MD       Or   ondansetron [facility]) injection 4 mg  4 mg Intravenous Q6H PRN Mikey College T, MD       pantoprazole (PROTONIX) EC tablet 40 mg  40 mg Oral Daily Enedina Finner, MD   40 mg at 11/02/23 0953   potassium chloride SA (KLOR-CON M) CR tablet 40 mEq  40 mEq Oral Once Bettey Costa, [facility]  Discharge Medications: Please see discharge summary for a list of discharge medications.  STOP taking these medications     bismuth subsalicylate 262 MG/15ML suspension Commonly known as: PEPTO BISMOL    furosemide 40 MG tablet Commonly known as: LASIX    metoprolol tartrate 25 MG tablet Commonly known as: LOPRESSOR    montelukast 10 MG tablet Commonly known as: SINGULAIR           TAKE these medications     acetaminophen 325 MG tablet Commonly known as: TYLENOL Take 650 mg by mouth every 4 (four) hours as needed for mild pain (pain score 1-3) or moderate pain (pain score 4-6). Give two tablets by mouth every 4 hours as needed.    albuterol (2.5 MG/3ML) 0.083% nebulizer solution Commonly known as: PROVENTIL Take 3 mLs (2.5 mg total) by nebulization every 6 (six) hours as needed for wheezing or shortness of breath. What changed: Another medication with the same name was removed. Continue taking this medication, and follow the directions you see here.    ARIPiprazole 10 MG tablet Commonly known as: ABILIFY Take 10 mg by mouth every evening.    fluticasone 50 MCG/ACT nasal spray Commonly known as: FLONASE Place 2 sprays into both nostrils at bedtime.    gabapentin 100  MG capsule Commonly known as: NEURONTIN Take 200 mg by mouth 3 (three) times daily.    Iron (Ferrous Sulfate) 325 (65 Fe) MG Tabs Take 1 tablet by mouth at bedtime.    lamoTRIgine 200 MG tablet Commonly known as: LAMICTAL Take 200 mg by mouth at bedtime.    lamoTRIgine 150 MG tablet Commonly known as: LAMICTAL Take 150 mg by mouth daily.    meclizine 25 MG tablet Commonly known as: ANTIVERT Take 12.5 mg by mouth every 12 (twelve) hours as needed for dizziness.    midodrine 2.5 MG tablet Commonly known as: PROAMATINE Take 2.5-5 mg by mouth 3 (three) times daily with meals. Give one tablet by mouth daily as needed for low BP    OLANZapine 5 MG tablet Commonly known as: ZYPREXA Take 5 mg by mouth at bedtime.    ondansetron 8 MG tablet Commonly known as: ZOFRAN Take 8 mg by mouth every 8 (eight) hours as needed for nausea or vomiting.    pantoprazole 40 MG tablet Commonly known as: PROTONIX Take 1 tablet (40 mg total) by mouth 2 (two) times daily before a meal.    polyethylene glycol 17 g packet Commonly known as: MIRALAX / GLYCOLAX Take 17 g by mouth every 3 (three) days.    potassium chloride 20 MEQ packet Commonly known as: KLOR-CON Take 20 mEq by mouth daily.    Preparation H 0.25-14-74.9 % rectal ointment Generic drug: phenylephrine-shark liver oil-mineral oil-petrolatum Place 1 Application rectally daily as needed for hemorrhoids.    rosuvastatin 40 MG tablet Commonly known as: CRESTOR Take 1 tablet (40 mg total) by mouth daily.     Relevant Imaging Results:  Relevant Lab Results:   Additional Information SS#: [id number]  Allena Katz, LCSW

## 2023-11-03 NOTE — Discharge Instructions (Addendum)
SLP to follow at LTC Hospice to follow at LTC]   Diet Recommendation   Nectar;Dysphagia 1 (puree) = dysphagia level 1(PUREE) w/ Nectar liquids via Cup; general aspiration precautions; reduce Distractions during meals and engage pt during po's at meal for self-feeding. Support w/ feeding at meals. Flavor foods w/ condiments, gravies. Do not give po's if not calm.   Medication Administration: Crushed with puree

## 2023-11-03 NOTE — Care Management Important Message (Signed)
Important Message  Patient Details  Name: [name] [name] MRN: [id number] Date of Birth: 10-Feb-1941   Important Message Given:  Other (see comment)  Patient to return to [facility] with hospice. Out of respect for the patient and family no Important Message from [facility] given.    Olegario Messier A Novalyn Lajara 11/03/2023, 9:09 AM

## 2023-11-03 NOTE — Progress Notes (Signed)
[facility] REFERRAL NOTE  Received request from Allena Katz, SW, Transitions of Care Manager, for hospice services at LTC at [facility] after discharge. Spoke with Eulogio Bear, patient's daughter to initiate education related to hospice philosophy, services, and team approach to care. Patient/family verbalized understanding of information given.  Patient has been a previous hospice patient.  Per discussion, the plan is for discharge to [facility] today by EMS.  DME needs discussed.  Patient has the following equipment in the home:  anything she needs at LTC Patient/family requests the following equipment for delivery:    No needs as patient is d/c to LTC            Please send signed and completed DNR home with patient/family if applicable.   Please provide portable DNR if applicable and prescriptions at discharge as needed to ensure ongoing symptom management.   [facility] information and contact numbers given to   Above information shared with Allena Katz, Transitions of Care Manager and hospital medical team.   Please call with any hospice related questions or concerns.  Thank you for the opportunity to participate in this patient's care.   Norris Cross, RN Nurse Liaison [phone]

## 2023-11-03 NOTE — Telephone Encounter (Signed)
Received call from receiving nurse regarding patient's medications. Patient is on honey-thickened liquids which cannot be mixed with miralax. Adjust medications given current status on hospice.

## 2023-11-03 NOTE — Discharge Summary (Signed)
Physician Discharge Summary   Patient: [name] [name] MRN: [id number] DOB: [birth year]  Admit date:     10/28/2023  Discharge date: 11/03/23  Discharge Physician: Enedina Finner   PCP: Earnestine Mealing, MD   Discharge Diagnoses: Principal Problem:   Aspiration pneumonia [facility]) Active Problems:   Sepsis (HCC)   Fall at home, initial encounter   Acute encephalopathy   Dementia with behavioral disturbance (HCC)  [name] [name] is a 82 y.o. female with medical history significant of dementia, PAF off Eliquis secondary to frequent falls and major GI bleed, advanced dementia, chronic dysphagia, seizure disorder, CKD stage III, orthostatic hypotension on midodrine, anxiety/depression sent from nursing home for evaluation of frequent falls, decreased oral intake and altered mentations.    And patient sustained 2 falls in last 3 days. 1 episode was the weekend and patient fell on her hip and complained of pain but x-ray showed no fracture or dislocation. Today patient fell again and hit her head. No LOC    Chest CT showed scattered infiltrates and consolidation bilaterally peripheral compatible with multifocal pneumonia and implying chronic aspiration.    Acute metabolic encephalopathy likely due to cognitive decline/dementia -- poor PO intake. Patient hard to redirect given cognitive decline/dementia -- patient is nonverbal. Does not communicate, no PO intake, unable to take PO meds. --pt takes meds intermittently.   History of falls without any trauma -- imaging studies no fracture   Pneumonia appears aspiration given history of chronic dysphagia -- IV Unasyn x 5 days -- patient did not cooperate with speech therapy--cont Dysphagia 1 diet with honey thick for now --at risk for dehydration   PAF -Paced rhythm --was  low-dose of metoprolol--- Holding due to soft BP and HR is stable. Restart if needed --Decision was made earlier this year to remove Eliquis due to frequent falls and a major  GI bleed this year.   CKD stage IIIb Hypokalemia -Volume contracted, creatinine level stable, on IV fluid -- baseline creatinine 0.94-- 1.2--1.5 -- patient received IV fluids during hospitalization -- pharmacy to replete potassium   Failure to thrive, poor PO intake, malnourished  Chronic dysphagia 11/30-- spoke with patient's daughter [name] on the phone. She understands overall poor prognosis with her mom. She is in agreement with patient returning back to [facility] with hospice. TOC reached out to admissions coordinator at [facility] and they are in agreement with patient returning back on Monday. They are aware patient is not eating and drinking adequately with limited  communication.   Pt to be followed by Hospice     Family communication : none today CODE STATUS: DNR/DNI DVT Prophylaxis : heparin     Disposition: TL memory care with hospice Diet recommendation:  Dysphagia type 1 nectar thick Liquid DISCHARGE MEDICATION: Allergies as of 11/03/2023       Reactions   Codeine Nausea And Vomiting, Other (See Comments)   migraine   Epinephrine Palpitations, Shortness Of Breath   Hydrocodone Nausea And Vomiting, Other (See Comments)   MIGRAINE   Hydromorphone Nausea And Vomiting, Other (See Comments)   migraine   Molds & Smuts Anxiety, Other (See Comments), Shortness Of Breath   Oxycodone Nausea And Vomiting, Other (See Comments)   Severe migraine   Meloxicam Other (See Comments)   Severe reflux   Codeine Other (See Comments)   Migraine   Dextromethorphan Other (See Comments)   seizures   Diphen [diphenhydramine Hcl] Other (See Comments)   seizure   Diphenhydramine Other (See Comments)  seizure   Diphenhydramine Hcl    Other reaction(s): Other (See Comments)   Diphenylpyraline Other (See Comments)   Doxycycline Itching, Swelling, Other (See Comments)   Facial   Doxycycline Itching, Swelling   Hydrocodone Other (See Comments)   Migraine   Hydromorphone Other  (See Comments)   Migraine   Meloxicam Other (See Comments)   Mobic [meloxicam] Other (See Comments)   reflux   Nsaids    Nsaids Other (See Comments)   Pt on blood thinner   Oxycodone Other (See Comments)   Migraine   Propofol Other (See Comments)   Propofol Other (See Comments)   Very sensitive; patient stated she was told she was told she had apnea        Medication List     STOP taking these medications    bismuth subsalicylate 262 MG/15ML suspension Commonly known as: PEPTO BISMOL   furosemide 40 MG tablet Commonly known as: LASIX   metoprolol tartrate 25 MG tablet Commonly known as: LOPRESSOR   montelukast 10 MG tablet Commonly known as: SINGULAIR       TAKE these medications    acetaminophen 325 MG tablet Commonly known as: TYLENOL Take 650 mg by mouth every 4 (four) hours as needed for mild pain (pain score 1-3) or moderate pain (pain score 4-6). Give two tablets by mouth every 4 hours as needed.   albuterol (2.5 MG/3ML) 0.083% nebulizer solution Commonly known as: PROVENTIL Take 3 mLs (2.5 mg total) by nebulization every 6 (six) hours as needed for wheezing or shortness of breath. What changed: Another medication with the same name was removed. Continue taking this medication, and follow the directions you see here.   ARIPiprazole 10 MG tablet Commonly known as: ABILIFY Take 10 mg by mouth every evening.   fluticasone 50 MCG/ACT nasal spray Commonly known as: FLONASE Place 2 sprays into both nostrils at bedtime.   gabapentin 100 MG capsule Commonly known as: NEURONTIN Take 200 mg by mouth 3 (three) times daily.   Iron (Ferrous Sulfate) 325 (65 Fe) MG Tabs Take 1 tablet by mouth at bedtime.   lamoTRIgine 200 MG tablet Commonly known as: LAMICTAL Take 200 mg by mouth at bedtime.   lamoTRIgine 150 MG tablet Commonly known as: LAMICTAL Take 150 mg by mouth daily.   meclizine 25 MG tablet Commonly known as: ANTIVERT Take 12.5 mg by mouth  every 12 (twelve) hours as needed for dizziness.   midodrine 2.5 MG tablet Commonly known as: PROAMATINE Take 2.5-5 mg by mouth 3 (three) times daily with meals. Give one tablet by mouth daily as needed for low BP   OLANZapine 5 MG tablet Commonly known as: ZYPREXA Take 5 mg by mouth at bedtime.   ondansetron 8 MG tablet Commonly known as: ZOFRAN Take 8 mg by mouth every 8 (eight) hours as needed for nausea or vomiting.   pantoprazole 40 MG tablet Commonly known as: PROTONIX Take 1 tablet (40 mg total) by mouth 2 (two) times daily before a meal.   polyethylene glycol 17 g packet Commonly known as: MIRALAX / GLYCOLAX Take 17 g by mouth every 3 (three) days.   potassium chloride 20 MEQ packet Commonly known as: KLOR-CON Take 20 mEq by mouth daily.   Preparation H 0.25-14-74.9 % rectal ointment Generic drug: phenylephrine-shark liver oil-mineral oil-petrolatum Place 1 Application rectally daily as needed for hemorrhoids.   rosuvastatin 40 MG tablet Commonly known as: CRESTOR Take 1 tablet (40 mg total) by mouth daily.  Follow-up Information     Earnestine Mealing, MD. Schedule an appointment as soon as possible for a visit in 1 week(s).   Specialty: Family Medicine Contact information: [address] [place] [address] [phone]                Discharge Exam: Ceasar Mons Weights   10/28/23 1034  Weight: 66 kg   imited GENERAL:  82 y.o.-year-old patient with no acute distress. confused LUNGS: Normal breath sounds bilaterally CARDIOVASCULAR: S1, S2 normal.   NEUROLOGIC: unable to assess completely due to patient cooperation and participation. Patient is confused at baseline  Condition at discharge: poor  The results of significant diagnostics from this hospitalization (including imaging, microbiology, ancillary and laboratory) are listed below for reference.   Imaging Studies: CT Chest Wo Contrast  Result Date: 10/28/2023 CLINICAL DATA:   Pneumonia complication suspected. EXAM: CT CHEST WITHOUT CONTRAST TECHNIQUE: Multidetector CT imaging of the chest was performed following the standard protocol without IV contrast. RADIATION DOSE REDUCTION: This exam was performed according to the departmental dose-optimization program which includes automated exposure control, adjustment of the mA and/or kV according to patient size and/or use of iterative reconstruction technique. COMPARISON:  Chest radiograph dated 10/28/2023 and CT dated 08/07/2017. FINDINGS: Evaluation of this exam is limited in the absence of intravenous contrast as well as due to respiratory motion. Cardiovascular: Moderate cardiomegaly. No pericardial effusion. There is coronary vascular calcification and left pectoral pacemaker device. Mechanical mitral valve. Mild atherosclerotic calcification of the thoracic aorta. There is dilatation of the main pulmonary trunk suggestive of pulmonary hypertension. Mediastinum/Nodes: No obvious hilar or mediastinal adenopathy. The esophagus is grossly unremarkable. No mediastinal fluid collection. Lungs/Pleura: Bilateral upper lobe predominant patchy and streaky pulmonary opacities consistent with pneumonia. There is trace right pleural effusion. No pneumothorax. The central airways are patent. Upper Abdomen: Small liver cysts and additional hypodense lesions which are not characterized on this CT. There is mild biliary ductal dilatation or periportal edema. Musculoskeletal: Displaced healing subacute or old fracture of the body of the sternum with callus formation. There is osteopenia with degenerative changes of the spine. Several subacute or old appearing bilateral rib fractures. No definite acute osseous pathology. IMPRESSION: 1. Bilateral upper lobe predominant pneumonia. 2. Trace right pleural effusion. 3. Moderate cardiomegaly. 4.  Aortic Atherosclerosis (ICD10-I70.0). Electronically Signed   By: Elgie Collard M.D.   On: 10/28/2023 17:08    DG Chest 2 View  Result Date: 10/28/2023 CLINICAL DATA:  Shortness of breath. EXAM: CHEST - 2 VIEW COMPARISON:  04/29/2023. FINDINGS: Redemonstration of heterogeneous nonspecific opacities throughout bilateral lungs with right upper/mid lung zone predominance, essentially similar/slightly less conspicuous to the prior study. Findings may represent combination of atelectasis/scarring with superimposed alveolar opacities. Bilateral lungs are otherwise clear. There is mild pulmonary vascular congestion. Bilateral costophrenic angles are clear. Stable moderately enlarged cardio-mediastinal silhouette. There is a left sided 2-lead pacemaker. Left atrial appendage closure device and prosthetic mitral valve again seen. No acute osseous abnormalities. The soft tissues are within normal limits. IMPRESSION: *Mild pulmonary vascular congestion. *Redemonstration of heterogeneous nonspecific opacities throughout bilateral lungs with right upper/mid lung zone predominance, essentially similar/slightly less conspicuous to the prior study. Findings may represent combination of atelectasis/scarring with superimposed alveolar opacities which may be due to edema versus pneumonitis. Correlate clinically to determine the need for additional imaging with chest CT scan. Electronically Signed   By: Jules Schick M.D.   On: 10/28/2023 14:52   CT HEAD WO CONTRAST ( )  Result Date:  10/28/2023 CLINICAL DATA:  Provided history: Neck trauma. Head trauma, minor. Additional history provided: Fall (with head trauma). Right forehead ecchymosis. EXAM: CT HEAD WITHOUT CONTRAST CT CERVICAL SPINE WITHOUT CONTRAST TECHNIQUE: Multidetector CT imaging of the head and cervical spine was performed following the standard protocol without intravenous contrast. Multiplanar CT image reconstructions of the cervical spine were also generated. RADIATION DOSE REDUCTION: This exam was performed according to the departmental dose-optimization program  which includes automated exposure control, adjustment of the mA and/or kV according to patient size and/or use of iterative reconstruction technique. COMPARISON:  Head CT 05/18/2023.  Cervical spine CT 04/17/2023. FINDINGS: CT HEAD FINDINGS Brain: Mild generalized parenchymal atrophy. Patchy and ill-defined hypoattenuation within the cerebral white matter, nonspecific but compatible with mild chronic small vessel ischemic disease. There is no acute intracranial hemorrhage. No demarcated cortical infarct. No extra-axial fluid collection. No evidence of an intracranial mass. No midline shift. Vascular: No hyperdense vessel. Atherosclerotic calcifications. Skull: No calvarial fracture or aggressive osseous lesion. Sinuses/Orbits: No acute orbital finding. Prior right ocular lens replacement. Small-volume frothy secretions within the left sphenoid sinus. Other: Forehead hematoma. Trace fluid within the right mastoid air cells. CT CERVICAL SPINE FINDINGS Mildly motion degraded exam. Within this limitation, findings are as follows. Alignment: Slight C7-T1 and T1-T2 grade 1 anterolisthesis. Dextrocurvature of the cervical spine. Levocurvature of the upper thoracic spine, partially imaged. Skull base and vertebrae: The basion-dental and atlanto-dental intervals are maintained.No evidence of acute fracture to the cervical spine. Soft tissues and spinal canal: Subcentimeter thyroid nodules not meeting consensus criteria for ultrasound follow-up based on size. No follow-up imaging recommended. Reference: J Am Coll Radiol. 2015 Feb;12(2): 143-50. Disc levels: Cervical spondylosis. No more than mild disc space narrowing. Shallow multilevel disc bulges/central disc protrusions. Multilevel uncovertebral hypertrophy and facet arthrosis. No appreciable high-grade spinal canal stenosis. No significant bony neural foraminal narrowing. Upper chest: No consolidation within the imaged lung apices. No visible pneumothorax. Biapical  pleuroparenchymal scarring. Other: Chronic fracture deformity of the distal left clavicle. IMPRESSION: CT head: 1.  No evidence of an acute intracranial abnormality. 2. Forehead hematoma. 3. Mild parenchymal atrophy and chronic small vessel ischemic disease. 4. Mild left sphenoid sinusitis. 5. Trace right mastoid effusion. CT cervical spine: 1. Mildly motion degraded exam. 2. No evidence of an acute cervical spine fracture. 3. Mild grade 1 anterolisthesis at C7-T1 and T1-T2. 4. Dextrocurvature of the cervical spine and partially imaged levocurvature of the upper thoracic spine. 5. Cervical spondylosis as described. Electronically Signed   By: Jackey Loge D.O.   On: 10/28/2023 12:56   CT Cervical Spine Wo Contrast  Result Date: 10/28/2023 CLINICAL DATA:  Provided history: Neck trauma. Head trauma, minor. Additional history provided: Fall (with head trauma). Right forehead ecchymosis. EXAM: CT HEAD WITHOUT CONTRAST CT CERVICAL SPINE WITHOUT CONTRAST TECHNIQUE: Multidetector CT imaging of the head and cervical spine was performed following the standard protocol without intravenous contrast. Multiplanar CT image reconstructions of the cervical spine were also generated. RADIATION DOSE REDUCTION: This exam was performed according to the departmental dose-optimization program which includes automated exposure control, adjustment of the mA and/or kV according to patient size and/or use of iterative reconstruction technique. COMPARISON:  Head CT 05/18/2023.  Cervical spine CT 04/17/2023. FINDINGS: CT HEAD FINDINGS Brain: Mild generalized parenchymal atrophy. Patchy and ill-defined hypoattenuation within the cerebral white matter, nonspecific but compatible with mild chronic small vessel ischemic disease. There is no acute intracranial hemorrhage. No demarcated cortical infarct. No extra-axial fluid collection. No evidence of  an intracranial mass. No midline shift. Vascular: No hyperdense vessel. Atherosclerotic  calcifications. Skull: No calvarial fracture or aggressive osseous lesion. Sinuses/Orbits: No acute orbital finding. Prior right ocular lens replacement. Small-volume frothy secretions within the left sphenoid sinus. Other: Forehead hematoma. Trace fluid within the right mastoid air cells. CT CERVICAL SPINE FINDINGS Mildly motion degraded exam. Within this limitation, findings are as follows. Alignment: Slight C7-T1 and T1-T2 grade 1 anterolisthesis. Dextrocurvature of the cervical spine. Levocurvature of the upper thoracic spine, partially imaged. Skull base and vertebrae: The basion-dental and atlanto-dental intervals are maintained.No evidence of acute fracture to the cervical spine. Soft tissues and spinal canal: Subcentimeter thyroid nodules not meeting consensus criteria for ultrasound follow-up based on size. No follow-up imaging recommended. Reference: J Am Coll Radiol. 2015 Feb;12(2): 143-50. Disc levels: Cervical spondylosis. No more than mild disc space narrowing. Shallow multilevel disc bulges/central disc protrusions. Multilevel uncovertebral hypertrophy and facet arthrosis. No appreciable high-grade spinal canal stenosis. No significant bony neural foraminal narrowing. Upper chest: No consolidation within the imaged lung apices. No visible pneumothorax. Biapical pleuroparenchymal scarring. Other: Chronic fracture deformity of the distal left clavicle. IMPRESSION: CT head: 1.  No evidence of an acute intracranial abnormality. 2. Forehead hematoma. 3. Mild parenchymal atrophy and chronic small vessel ischemic disease. 4. Mild left sphenoid sinusitis. 5. Trace right mastoid effusion. CT cervical spine: 1. Mildly motion degraded exam. 2. No evidence of an acute cervical spine fracture. 3. Mild grade 1 anterolisthesis at C7-T1 and T1-T2. 4. Dextrocurvature of the cervical spine and partially imaged levocurvature of the upper thoracic spine. 5. Cervical spondylosis as described. Electronically Signed   By:  Jackey Loge D.O.   On: 10/28/2023 12:56   CT HIP LEFT WO CONTRAST  Result Date: 10/27/2023 CLINICAL DATA:  Left hip pain, fall 3 days ago, unable to bear weight. EXAM: CT OF THE LEFT HIP WITHOUT CONTRAST TECHNIQUE: Multidetector CT imaging of the left hip was performed according to the standard protocol. Multiplanar CT image reconstructions were also generated. RADIATION DOSE REDUCTION: This exam was performed according to the departmental dose-optimization program which includes automated exposure control, adjustment of the mA and/or kV according to patient size and/or use of iterative reconstruction technique. COMPARISON:  CT pelvis 05/17/2023 FINDINGS: Bones/Joint/Cartilage Substantially improved fusion of the prior displaced fracture of the right inferior pubic ramus. Moreover, the previous stress fracture the right pubic body is substantially improved. We only partially include the right superior pubic ramus fracture which demonstrates at least partial nonunion. Substantially reduced conspicuity of the insufficiency fracture involving the sacral ala. There is still continued visibility of the transverse fracture in the S3 segment, as was shown on 05/17/2023. There is a left femoral intra-articular nail system in place with 2 cannulated appearing femoral head screws similar to that shown previously. We include the proximal 20 cm of the IM nail on today's examination, and this includes a lateral plate and screw fixator around a transverse fracture component in the proximal diaphysis. There is still faint persistence of the transverse fracture plane, although with notable woven bone in a fusiform configuration at the level of the fracture. The lateral plate and screw fixator has 2 proximal and 2 distal screws, the 2 proximal screws are fractured on image 59 of series 6. No new lucency around the visualized hardware. The only hardware complicating feature I observe is the screw fracture along the lateral  plate and screw fixator noted above. No overt hip joint effusion. Ligaments Suboptimally assessed by CT. Muscles and  Tendons Unremarkable Soft tissues Iliac atheromatous vascular calcification. Mild presacral edema. No sciatic notch impingement. No regional bursitis identified. IMPRESSION: 1. Substantially improved bony fusion of the prior displaced fracture of the right inferior pubic ramus. 2. Substantially improved appearance of the previous stress fracture of the right pubic body. 3. Substantially reduced conspicuity of the insufficiency fracture involving the sacral ala. There is still continued visibility of the transverse fracture in the S3 segment, as was shown on 05/17/2023. Left femoral intra-articular nail system in place with 2 cannulated appearing femoral head screws similar to that shown previously. 4. The previous transverse diaphyseal fracture of the femur is still faintly visible although traversed by bridging fusiform woven bone; the lateral plate and screw fixator traversing this fracture laterally demonstrates interval fracture of the 2 screws proximal to the original fracture plane. 5. Iliac atheromatous vascular calcification. 6. Mild presacral edema. Electronically Signed   By: Gaylyn Rong M.D.   On: 10/27/2023 15:58    Microbiology: Results for orders placed or performed during the hospital encounter of 10/28/23  Blood culture (routine x 2)     Status: None   Collection Time: 10/28/23  3:03 PM   Specimen: BLOOD  Result Value Ref Range Status   Specimen Description BLOOD RIGHT FA  Final   Special Requests BOTTLES DRAWN AEROBIC AND ANAEROBIC  Final   Culture   Final    NO GROWTH 5 DAYS Performed at [facility], [address] [place], [place] [address]    Report Status 11/02/2023 FINAL  Final  Blood culture (routine x 2)     Status: None   Collection Time: 10/28/23  3:03 PM   Specimen: BLOOD  Result Value Ref Range Status   Specimen Description BLOOD LEFT  FA  Final   Special Requests BOTTLES DRAWN AEROBIC AND ANAEROBIC  Final   Culture   Final    NO GROWTH 5 DAYS Performed at [facility], [address], [place], [place] [address]    Report Status 11/02/2023 FINAL  Final  SARS Coronavirus 2 by RT PCR (hospital order, performed in [facility] hospital lab) *cepheid single result test* Anterior Nasal Swab     Status: None   Collection Time: 10/28/23  3:03 PM   Specimen: Anterior Nasal Swab  Result Value Ref Range Status   SARS Coronavirus 2 by RT PCR NEGATIVE NEGATIVE Final    Comment: (NOTE) SARS-CoV-2 target nucleic acids are NOT DETECTED.  The SARS-CoV-2 RNA is generally detectable in upper and lower respiratory specimens during the acute phase of infection. The lowest concentration of SARS-CoV-2 viral copies this assay can detect is 250 copies / mL. A negative result does not preclude SARS-CoV-2 infection and should not be used as the sole basis for treatment or other patient management decisions.  A negative result may occur with improper specimen collection / handling, submission of specimen other than nasopharyngeal swab, presence of viral mutation(s) within the areas targeted by this assay, and inadequate number of viral copies (<250 copies / mL). A negative result must be combined with clinical observations, patient history, and epidemiological information.  Fact Sheet for Patients:   RoadLapTop.co.za  Fact Sheet for Healthcare Providers: http://kim-miller.com/  This test is not yet approved or  cleared by the [place] FDA and has been authorized for detection and/or diagnosis of SARS-CoV-2 by FDA under an Emergency Use Authorization (EUA).  This EUA will remain in effect (meaning this test can be used) for the duration of the  COVID-19 declaration under Section 564(b)(1) of the Act, 21 U.S.C. section 360bbb-3(b)(1), unless the authorization is terminated  or revoked sooner.  Performed at [facility], [address] [place], [place] [address]    *Note: Due to a large number of results and/or encounters for the requested time period, some results have not been displayed. A complete set of results can be found in Results Review.    Labs: CBC: Recent Labs  Lab 10/28/23 1036 10/29/23 0412 10/31/23 0554  WBC 12.9* 14.2* 14.1*  HGB 9.7* 9.6* 8.7*  HCT 30.0* 30.2* 26.5*  MCV 95.2 96.2 94.3  PLT 114* 107* 108*   Basic Metabolic Panel: Recent Labs  Lab 10/28/23 1036 10/29/23 0412 10/31/23 0554 11/01/23 0437 11/03/23 0605  NA 138 138 144 145 150*  K 3.4* 3.4* 2.9* 3.0* 3.1*  CL 98 104 108 109 113*  CO2 27 23 27  21* 27  GLUCOSE 118* 86 99 72 114*  BUN 28* 25* 24* 27* 38*  CREATININE 1.67* 1.52* 1.56* 1.52* 1.32*  CALCIUM 9.6 8.6* 9.2 9.4 9.3  MG  --   --   --  2.5*  --   PHOS  --   --   --  2.7  --     Discharge time spent: greater than 30 minutes.  Signed: Enedina Finner, MD [facility] 11/03/2023

## 2023-11-03 NOTE — TOC Transition Note (Addendum)
Transition of Care [facility]) - CM/SW Discharge Note   Patient Details  Name: [name] [name] MRN: [id number] Date of Birth: 16-Feb-1941  [facility] CM/SW Contact:  Allena Katz, LCSW Phone Number: 11/03/2023, 10:11 AM   Clinical Narrative:   Pt has orders to discharge back to LTC with [facility]. DC summary and fl2 sent. DNR signed and on the chart, number for report sent. Message left for son brent notifying pt was being discharged.    Final next level of care: Memory Care (With hospice) Barriers to Discharge: Barriers Resolved   Patient Goals and CMS Choice      Discharge Placement                Patient chooses bed at: [facility] Patient to be transferred to facility by: [facility]      Discharge Plan and Services Additional resources added to the After Visit Summary for       Post Acute Care Choice: Nursing Home                               Social Determinants of Health (SDOH) Interventions SDOH Screenings   Food Insecurity: No Food Insecurity (10/29/2023)  Housing: Patient Declined (10/29/2023)  Transportation Needs: No Transportation Needs (10/29/2023)  Utilities: Not At Risk (10/29/2023)  Depression (PHQ2-9): Low Risk  (06/26/2023)  Recent Concern: Depression (PHQ2-9) - High Risk (05/08/2023)  Financial Resource Strain: Low Risk  (03/17/2023)  Physical Activity: Unknown (03/17/2023)  Social Connections: Unknown (03/17/2023)  Stress: No Stress Concern Present (03/17/2023)  Tobacco Use: Low Risk  (10/28/2023)     Readmission Risk Interventions     No data to display

## 2023-11-05 ENCOUNTER — Encounter: Payer: Self-pay | Admitting: Student

## 2023-11-05 ENCOUNTER — Non-Acute Institutional Stay (SKILLED_NURSING_FACILITY): Admitting: Student

## 2023-11-05 DIAGNOSIS — N1831 Chronic kidney disease, stage 3a: Secondary | ICD-10-CM

## 2023-11-05 DIAGNOSIS — I4819 Other persistent atrial fibrillation: Secondary | ICD-10-CM

## 2023-11-05 DIAGNOSIS — Z515 Encounter for palliative care: Secondary | ICD-10-CM | POA: Diagnosis not present

## 2023-11-05 DIAGNOSIS — I5022 Chronic systolic (congestive) heart failure: Secondary | ICD-10-CM

## 2023-11-05 DIAGNOSIS — J690 Pneumonitis due to inhalation of food and vomit: Secondary | ICD-10-CM

## 2023-11-05 DIAGNOSIS — I272 Pulmonary hypertension, unspecified: Secondary | ICD-10-CM

## 2023-11-05 DIAGNOSIS — R569 Unspecified convulsions: Secondary | ICD-10-CM

## 2023-11-05 DIAGNOSIS — F03918 Unspecified dementia, unspecified severity, with other behavioral disturbance: Secondary | ICD-10-CM

## 2023-11-05 MED ORDER — MORPHINE SULFATE (CONCENTRATE) 20 MG/ML PO SOLN: 5.0000 mg | ORAL | 0 refills | Status: DC | PRN

## 2023-11-05 MED ORDER — LORAZEPAM 0.5 MG PO TABS: 0.5000 mg | ORAL_TABLET | Freq: Three times a day (TID) | ORAL | 0 refills | Status: DC

## 2023-11-05 NOTE — Progress Notes (Unsigned)
Provider:  Dr. Earnestine Mealing Location:  Other [place].  Nursing Home Room Number: [facility] 204A Place of Service:  SNF (31)  PCP: Earnestine Mealing, MD Patient Care Team: Earnestine Mealing, MD as PCP - General (Family Medicine) Marinus Maw, MD as PCP - Electrophysiology (Cardiology) Antonieta Iba, MD as PCP - Cardiology (Cardiology) Lemar Livings, Merrily Pew, MD (General Surgery) Sherlene Shams, MD (Internal Medicine) Sherlene Shams, MD (Internal Medicine) Toney Reil, MD as Consulting Physician (Gastroenterology)  Extended Emergency Contact Information Primary Emergency Contact: Woolf,brett Address: [address]          [place], [place] [address] Darden Amber of [place] Phone: [phone] Relation: Son Secondary Emergency Contact: Herman,Cheryl Address: [address]          [place], [place] [address] Darden Amber of [place] Phone: [phone] Relation: Daughter  Code Status: DNR Goals of Care: Advanced Directive information    11/05/2023    9:07 AM  Advanced Directives  Does Patient Have a Medical Advance Directive? Yes  Type of Estate agent of [place];Out of facility DNR (pink MOST or yellow form);Living will  Does patient want to make changes to medical advance directive? No - Patient declined  Copy of Healthcare Power of Attorney in Chart? Yes - validated most recent copy scanned in chart (See row information)      Chief Complaint  Patient presents with  . New Admit To SNF    Admission.     HPI: Patient is a 82 y.o. female seen today for admission to [facility]   She gives her name and location, states it is December 23, 2023. She had a breathing treatment today which helped a lot. She was glad that I recognized her and came to see her which made her day. She is nervous, but doesn't know why. She wonders if she has been nervous. She thought it could have been from waiting a long time to go to the  bathroom. She didn't eat in the hospital because she iddn't   Past Medical History:  Diagnosis Date  . Acute on chronic diastolic (congestive) heart failure (HCC)   . Allergy    See list  . Altered mental status 04/23/2023  . Anemia 2018  . Anxiety    Occasionally take Xanax for sleep  . Anxiety associated with depression    Prn alprazolam   . Anxiety associated with depression   . Arthritis    Hands, Back  . Atrial fibrillation, persistent (HCC)    DCCV 08/22/2015  . Bradycardia post-op bradycardia, pacer dependent   MDT PPM 11/06/15, Dr. Ladona Ridgel  . Cataract    Left eye  . Chronic kidney disease (CKD) stage G3a/A2, moderately decreased glomerular filtration rate (GFR) between 45-59 mL/min/1.73 square meter and albuminuria creatinine ratio between 30-299 mg/g (HCC) 11/13/2021  . Closed bilateral fracture of pubic rami (HCC) 08/28/2022  . Diverticulosis   . Focal seizures (HCC)   . Heart murmur   . Hematuria, gross 04/09/2018  . Hemorrhoid   . Hepatic cyst    innumerable  . 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 Hyperlipidemia   . IBS (irritable bowel syndrome)   . 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 Migraine   . MVP (mitral valve prolapse)   . Near syncope 01/26/2023  . Nodule of right lung   . Osteoporosis of forearm   . RBBB   . Restrictive lung disease    Mild on  PFT & likely cardiac in etiology   . Right sided sciatica 04/10/2022  . S/P Minimally invasive maze operation for atrial fibrillation 10/31/2015   Complete bilateral atrial lesion set using cryothermy and bipolar radiofrequency ablation with clipping of LA appendage via right mini thoracotomy approach  . S/P minimally invasive mitral valve replacement with bioprosthetic valve 10/31/2015   33 mm [facility] Mitral bovine bioprosthetic tissue valve placed via right mini thoracotomy approach  . Seizures (HCC)    left foot paralysis and left hand paralysis Dr. Sherryll Burger  . Severe mitral regurgitation   .  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 SVT (supraventricular tachycardia) (HCC)   . Thoracic aorta atherosclerosis (HCC)   . TIA (transient ischemic attack)   . Visit for preventive health examination 05/23/2016   Past Surgical History:  Procedure Laterality Date  . BREAST EXCISIONAL BIOPSY Right Late 80s   Negative X2  . CARDIAC CATHETERIZATION N/A 10/18/2015   Procedure: Right/Left Heart Cath and Coronary Angiography;  Surgeon: Tonny Bollman, MD;  Location: [facility] INVASIVE CV LAB;  Service: Cardiovascular;  Laterality: N/A;  . CARDIOVERSION N/A 08/22/2015   Procedure: CARDIOVERSION;  Surgeon: Vesta Mixer, MD;  Location: [facility] ENDOSCOPY;  Service: Cardiovascular;  Laterality: N/A;  . COLONOSCOPY  2003  . EP IMPLANTABLE DEVICE N/A 11/06/2015   Procedure: Pacemaker Implant;  Surgeon: Marinus Maw, MD;  Location[facility] INVASIVE CV LAB;  Service: Cardiovascular;  Laterality: N/A;  . EP IMPLANTABLE DEVICE N/A 02/20/2016   Procedure: Lead Extraction;  Surgeon: Marinus Maw, MD;  Location: [facility] INVASIVE CV LAB;  Service: Cardiovascular;  Laterality: N/A;  . ESOPHAGOGASTRODUODENOSCOPY (EGD) WITH PROPOFOL N/A 05/19/2023   Procedure: ESOPHAGOGASTRODUODENOSCOPY (EGD) WITH PROPOFOL;  Surgeon: Midge Minium, MD;  Location: [facility] ENDOSCOPY;  Service: Endoscopy;  Laterality: N/A;  . EYE SURGERY Right 2013  . FEMUR IM NAIL Left 06/18/2021   Procedure: INTRAMEDULLARY (IM) NAIL FEMORAL, OPEN REDUCTION INTERNAL FIXATION FEMUR RIGHT LITTLE FINGER PIP CLOSED REDUCTION;  Surgeon: Myrene Galas, MD;  Location: [facility] OR;  Service: Orthopedics;  Laterality: Left;  . FOOT SURGERY     ~2007 right foot bunion  . MANDIBLE FRACTURE SURGERY  03/26/2013  . MINIMALLY INVASIVE MAZE PROCEDURE N/A 10/31/2015   Procedure: MINIMALLY INVASIVE MAZE PROCEDURE;  Surgeon: Purcell Nails, MD;  Location: [facility] OR;  Service: Open Heart Surgery;  Laterality: N/A;  . MITRAL VALVE REPLACEMENT Right 10/31/2015   Procedure:  MINIMALLY INVASIVE MITRAL VALVE (MV) REPLACEMENT;  Surgeon: Purcell Nails, MD;  Location: [facility] OR;  Service: Open Heart Surgery;  Laterality: Right;  . PACEMAKER LEAD REMOVAL  02/20/2016  . TEE WITH CARDIOVERSION    . TEE WITHOUT CARDIOVERSION N/A 08/22/2015   Procedure: TRANSESOPHAGEAL ECHOCARDIOGRAM (TEE);  Surgeon: Vesta Mixer, MD;  Location: [facility] ENDOSCOPY;  Service: Cardiovascular;  Laterality: N/A;  . TEE WITHOUT CARDIOVERSION N/A 10/31/2015   Procedure: TRANSESOPHAGEAL ECHOCARDIOGRAM (TEE);  Surgeon: Purcell Nails, MD;  Location: [facility] OR;  Service: Open Heart Surgery;  Laterality: N/A;  . TUBAL LIGATION    . VARICOSE VEIN SURGERY Right     reports that she has never smoked. She has never used smokeless tobacco. She reports that she does not drink alcohol and does not use drugs. Social History   Socioeconomic History  . Marital status: Widowed    Spouse name: Deceased  . Number of children: 2  . Years of education: 16  . Highest education level: Master's degree (e.g., MA, MS,  MEng, MEd, MSW, [employer])  Occupational History  . Occupation: Retired  Tobacco Use  . Smoking status: Never  . Smokeless tobacco: Never  Vaping Use  . Vaping status: Never Used  Substance and Sexual Activity  . Alcohol use: No    Alcohol/week: 3.0 standard drinks of alcohol    Types: 3 Standard drinks or equivalent per week  . Drug use: No  . Sexual activity: Not Currently  Other Topics Concern  . Not on file  Social History Narrative   ** Merged History Encounter **       She is a widow.  Husband died from metastatic renal cell cancer. Originally from [place]. Previously lived in [place] from 1975-[place]. Moved to [place] in 2007. No mold exposure recently but did have it through a prior work exposure in 1993. Has a masters in    public health. No bird exposure.  [facility] (09/29/17): She has moved into a retirement community since last appointment. She reports she had testing at her new residence  that was positive for mold. It has since been treated.    Social Determinants of Health   Financial Resource Strain: Low Risk  (03/17/2023)   Overall Financial Resource Strain (CARDIA)   . Difficulty of Paying Living Expenses: Not hard at all  Food Insecurity: No Food Insecurity (10/29/2023)   Hunger Vital Sign   . Worried About Programme researcher, broadcasting/film/video in the Last Year: Never true   . Ran Out of Food in the Last Year: Never true  Transportation Needs: No Transportation Needs (10/29/2023)   PRAPARE - Transportation   . Lack of Transportation (Medical): No   . Lack of Transportation (Non-Medical): No  Physical Activity: Unknown (03/17/2023)   Exercise Vital Sign   . Days of Exercise per Week: Patient declined   . Minutes of Exercise per Session: Not on file  Stress: No Stress Concern Present (03/17/2023)   [employer] of Occupational Health - Occupational Stress Questionnaire   . Feeling of Stress : Only a little  Social Connections: Unknown (03/17/2023)   Social Connection and Isolation Panel [NHANES]   . Frequency of Communication with Friends and Family: Patient declined   . Frequency of Social Gatherings with Friends and Family: Once a week   . Attends Religious Services: Patient declined   . Active Member of Clubs or Organizations: Yes   . Attends Banker Meetings: Patient declined   . Marital Status: Widowed  Intimate Partner Violence: Not At Risk (10/29/2023)   Humiliation, Afraid, Rape, and Kick questionnaire   . Fear of Current or Ex-Partner: No   . Emotionally Abused: No   . Physically Abused: No   . Sexually Abused: No    Functional Status Survey:    Family History  Problem Relation Age of Onset  . Hypertension Mother   . Arrhythmia Mother   . Heart failure Mother   . Arrhythmia Brother   . Stroke Brother 69       cerebral hemorrhage, nonsmoker, no HTN  . Prostate cancer Brother   . Stroke Father        from an aneurysm  . Stroke Maternal Aunt  83       cerebral hemorrhage  . Liver cancer Maternal Grandmother   . Atrial fibrillation Son   . Celiac disease Son   . Heart attack Neg Hx   . Breast cancer Neg Hx     Health Maintenance  Topic Date Due  . DTaP/Tdap/Td (2 -  Td or Tdap) 06/25/2022  . COVID-19 Vaccine (7 - 2023-24 season) 10/24/2023  . Pneumonia Vaccine 71+ Years old  Completed  . INFLUENZA VACCINE  Completed  . DEXA SCAN  Completed  . Zoster Vaccines- Shingrix  Completed  . HPV VACCINES  Aged Out  . Hepatitis C Screening  Discontinued    Allergies  Allergen Reactions  . Codeine Nausea And Vomiting and Other (See Comments)    migraine  . Epinephrine Palpitations and Shortness Of Breath  . Hydrocodone Nausea And Vomiting and Other (See Comments)    MIGRAINE  . Hydromorphone Nausea And Vomiting and Other (See Comments)    migraine  . Molds & Smuts Anxiety, Other (See Comments) and Shortness Of Breath  . Oxycodone Nausea And Vomiting and Other (See Comments)    Severe migraine  . Meloxicam Other (See Comments)    Severe reflux  . Codeine Other (See Comments)    Migraine  . Dextromethorphan Other (See Comments)    seizures  . Diphen [Diphenhydramine Hcl] Other (See Comments)    seizure  . Diphenhydramine Other (See Comments)    seizure  . Diphenhydramine Hcl     Other reaction(s): Other (See Comments)  . Diphenylpyraline Other (See Comments)  . Doxycycline Itching, Swelling and Other (See Comments)    Facial  . Doxycycline Itching and Swelling  . Hydrocodone Other (See Comments)    Migraine  . Hydromorphone Other (See Comments)    Migraine  . Meloxicam Other (See Comments)  . Mobic [Meloxicam] Other (See Comments)    reflux  . Nsaids   . Nsaids Other (See Comments)    Pt on blood thinner  . Oxycodone Other (See Comments)    Migraine  . Propofol Other (See Comments)  . Propofol Other (See Comments)    Very sensitive; patient stated she was told she was told she had apnea    Outpatient  Encounter Medications as of 11/05/2023  Medication Sig  . acetaminophen (TYLENOL) 325 MG tablet Take 650 mg by mouth every 4 (four) hours as needed for mild pain (pain score 1-3) or moderate pain (pain score 4-6). Give two tablets by mouth every 4 hours as needed.  Marland Kitchen albuterol (PROVENTIL) (2.5 MG/3ML) 0.083% nebulizer solution Take 3 mLs (2.5 mg total) by nebulization every 6 (six) hours as needed for wheezing or shortness of breath.  . ARIPiprazole (ABILIFY) 10 MG tablet Take 10 mg by mouth every evening.  . bisacodyl (DULCOLAX) 10 MG suppository Place 1 suppository (10 mg total) rectally as needed for moderate constipation.  . fluticasone (FLONASE) 50 MCG/ACT nasal spray Place 2 sprays into both nostrils at bedtime.  . lamoTRIgine (LAMICTAL) 150 MG tablet Take 150 mg by mouth daily.  Marland Kitchen lamoTRIgine (LAMICTAL) 200 MG tablet Take 200 mg by mouth at bedtime.  . midodrine (PROAMATINE) 2.5 MG tablet Take 5 mg by mouth 3 (three) times daily with meals. Give 2.5mg  tablet by mouth daily as needed for low BP  . OLANZapine (ZYPREXA) 5 MG tablet Take 5 mg by mouth at bedtime.  . ondansetron (ZOFRAN) 8 MG tablet Take 8 mg by mouth every 8 (eight) hours as needed for nausea or vomiting.  . phenylephrine-shark liver oil-mineral oil-petrolatum (PREPARATION H) 0.25-14-74.9 % rectal ointment Place 1 Application rectally daily as needed for hemorrhoids.  . potassium chloride (KLOR-CON) 20 MEQ packet Take 20 mEq by mouth daily.  Marland Kitchen senna (SENOKOT) 8.6 MG TABS tablet Take 1 tablet (8.6 mg total) by mouth at bedtime.  No facility-administered encounter medications on file as of 11/05/2023.    Review of Systems  Vitals:   11/05/23 0859 11/05/23 0913  BP: (!) 142/90 114/66  Pulse: 92   Resp: (!) 22   Temp: 97.8 F (36.6 C)   SpO2: 91%   Weight: 137 lb 9.6 oz (62.4 kg)   Height: 5\' 5"  (1.651 m)    Body mass index is 22.9 kg/m. Physical Exam Constitutional:      Comments: Thin, frail, wide eyed,  discheveled with food in her mouth.   HENT:     Mouth/Throat:     Mouth: Mucous membranes are dry.  Pulmonary:     Comments: Increased work of breathing, panting and unable to speak in full sentences without taking a breath.  Abdominal:     General: Abdomen is flat.     Palpations: Abdomen is soft.  Neurological:     Comments: Disoriented to time.   Psychiatric:     Comments: Easily distracted    Labs reviewed: Basic Metabolic Panel: Recent Labs    05/19/23 0410 05/20/23 0330 06/26/23 0930 08/12/23 0000 10/31/23 0554 11/01/23 0437 11/03/23 0605  NA 137   < > 142   < > 144 145 150*  K 3.5   < > 3.6   < > 2.9* 3.0* 3.1*  CL 103   < > 101   < > 108 109 113*  CO2 25   < > 33*   < > 27 21* 27  GLUCOSE 95   < > 93   < > 99 72 114*  BUN 19   < > 34*   < > 24* 27* 38*  CREATININE 0.96   < > 1.51*   < > 1.56* 1.52* 1.32*  CALCIUM 8.7*   < > 10.5   < > 9.2 9.4 9.3  MG 2.2  --  2.3  --   --  2.5*  --   PHOS  --   --   --   --   --  2.7  --    < > = values in this interval not displayed.   Liver Function Tests: Recent Labs    04/29/23 1501 05/17/23 1201 05/26/23 0000 07/01/23 1436 10/01/23 1444  AST 33 47* 26  --  33  ALT 24 26 15   --  16  ALKPHOS 109 123 129*  --  178*  BILITOT 0.9 1.5*  --   --  0.6  PROT 6.1* 6.8  --   --  5.9*  ALBUMIN 3.7 4.3  --  4.3 4.2   Recent Labs    04/29/23 1501  LIPASE 84*   Recent Labs    04/29/23 2118  AMMONIA 22   CBC: Recent Labs    04/17/23 0529 04/19/23 1331 04/23/23 0127 04/29/23 1500 06/02/23 1201 10/28/23 1036 10/29/23 0412 10/31/23 0554  WBC 3.4*   < > 4.3   < > 4.6 12.9* 14.2* 14.1*  NEUTROABS 2.2  --  3.1  --  3.1  --   --   --   HGB 9.0*   < > 9.8*   < > 8.9 Repeated and verified X2.* 9.7* 9.6* 8.7*  HCT 28.6*   < > 31.2*   < > 27.3* 30.0* 30.2* 26.5*  MCV 95.0   < > 95.1   < > 92.3 95.2 96.2 94.3  PLT 91*   < > 107*   < > 143.0* 114* 107* 108*   < > =  values in this interval not displayed.   Cardiac  Enzymes: Recent Labs    01/26/23 1019  CKTOTAL 115   BNP: Invalid input(s): "POCBNP" Lab Results  Component Value Date   HGBA1C 5.5 02/11/2020   Lab Results  Component Value Date   TSH 4.842 (H) 04/29/2023   Lab Results  Component Value Date   VITAMINB12 1,368 (H) 10/28/2023   Lab Results  Component Value Date   FOLATE 16.4 10/28/2023   Lab Results  Component Value Date   IRON 34 10/28/2023   TIBC 336 10/28/2023   FERRITIN 149 10/28/2023    Imaging and Procedures obtained prior to SNF admission: CT Chest Wo Contrast  Result Date: 10/28/2023 CLINICAL DATA:  Pneumonia complication suspected. EXAM: CT CHEST WITHOUT CONTRAST TECHNIQUE: Multidetector CT imaging of the chest was performed following the standard protocol without IV contrast. RADIATION DOSE REDUCTION: This exam was performed according to the departmental dose-optimization program which includes automated exposure control, adjustment of the mA and/or kV according to patient size and/or use of iterative reconstruction technique. COMPARISON:  Chest radiograph dated 10/28/2023 and CT dated 08/07/2017. FINDINGS: Evaluation of this exam is limited in the absence of intravenous contrast as well as due to respiratory motion. Cardiovascular: Moderate cardiomegaly. No pericardial effusion. There is coronary vascular calcification and left pectoral pacemaker device. Mechanical mitral valve. Mild atherosclerotic calcification of the thoracic aorta. There is dilatation of the main pulmonary trunk suggestive of pulmonary hypertension. Mediastinum/Nodes: No obvious hilar or mediastinal adenopathy. The esophagus is grossly unremarkable. No mediastinal fluid collection. Lungs/Pleura: Bilateral upper lobe predominant patchy and streaky pulmonary opacities consistent with pneumonia. There is trace right pleural effusion. No pneumothorax. The central airways are patent. Upper Abdomen: Small liver cysts and additional hypodense lesions which  are not characterized on this CT. There is mild biliary ductal dilatation or periportal edema. Musculoskeletal: Displaced healing subacute or old fracture of the body of the sternum with callus formation. There is osteopenia with degenerative changes of the spine. Several subacute or old appearing bilateral rib fractures. No definite acute osseous pathology. IMPRESSION: 1. Bilateral upper lobe predominant pneumonia. 2. Trace right pleural effusion. 3. Moderate cardiomegaly. 4.  Aortic Atherosclerosis (ICD10-I70.0). Electronically Signed   By: Elgie Collard M.D.   On: 10/28/2023 17:08   DG Chest 2 View  Result Date: 10/28/2023 CLINICAL DATA:  Shortness of breath. EXAM: CHEST - 2 VIEW COMPARISON:  04/29/2023. FINDINGS: Redemonstration of heterogeneous nonspecific opacities throughout bilateral lungs with right upper/mid lung zone predominance, essentially similar/slightly less conspicuous to the prior study. Findings may represent combination of atelectasis/scarring with superimposed alveolar opacities. Bilateral lungs are otherwise clear. There is mild pulmonary vascular congestion. Bilateral costophrenic angles are clear. Stable moderately enlarged cardio-mediastinal silhouette. There is a left sided 2-lead pacemaker. Left atrial appendage closure device and prosthetic mitral valve again seen. No acute osseous abnormalities. The soft tissues are within normal limits. IMPRESSION: *Mild pulmonary vascular congestion. *Redemonstration of heterogeneous nonspecific opacities throughout bilateral lungs with right upper/mid lung zone predominance, essentially similar/slightly less conspicuous to the prior study. Findings may represent combination of atelectasis/scarring with superimposed alveolar opacities which may be due to edema versus pneumonitis. Correlate clinically to determine the need for additional imaging with chest CT scan. Electronically Signed   By: Jules Schick M.D.   On: 10/28/2023 14:52   CT  HEAD WO CONTRAST ( )  Result Date: 10/28/2023 CLINICAL DATA:  Provided history: Neck trauma. Head trauma, minor. Additional history provided: Fall (with head trauma). Right forehead  ecchymosis. EXAM: CT HEAD WITHOUT CONTRAST CT CERVICAL SPINE WITHOUT CONTRAST TECHNIQUE: Multidetector CT imaging of the head and cervical spine was performed following the standard protocol without intravenous contrast. Multiplanar CT image reconstructions of the cervical spine were also generated. RADIATION DOSE REDUCTION: This exam was performed according to the departmental dose-optimization program which includes automated exposure control, adjustment of the mA and/or kV according to patient size and/or use of iterative reconstruction technique. COMPARISON:  Head CT 05/18/2023.  Cervical spine CT 04/17/2023. FINDINGS: CT HEAD FINDINGS Brain: Mild generalized parenchymal atrophy. Patchy and ill-defined hypoattenuation within the cerebral white matter, nonspecific but compatible with mild chronic small vessel ischemic disease. There is no acute intracranial hemorrhage. No demarcated cortical infarct. No extra-axial fluid collection. No evidence of an intracranial mass. No midline shift. Vascular: No hyperdense vessel. Atherosclerotic calcifications. Skull: No calvarial fracture or aggressive osseous lesion. Sinuses/Orbits: No acute orbital finding. Prior right ocular lens replacement. Small-volume frothy secretions within the left sphenoid sinus. Other: Forehead hematoma. Trace fluid within the right mastoid air cells. CT CERVICAL SPINE FINDINGS Mildly motion degraded exam. Within this limitation, findings are as follows. Alignment: Slight C7-T1 and T1-T2 grade 1 anterolisthesis. Dextrocurvature of the cervical spine. Levocurvature of the upper thoracic spine, partially imaged. Skull base and vertebrae: The basion-dental and atlanto-dental intervals are maintained.No evidence of acute fracture to the cervical spine. Soft tissues  and spinal canal: Subcentimeter thyroid nodules not meeting consensus criteria for ultrasound follow-up based on size. No follow-up imaging recommended. Reference: J Am Coll Radiol. 2015 Feb;12(2): 143-50. Disc levels: Cervical spondylosis. No more than mild disc space narrowing. Shallow multilevel disc bulges/central disc protrusions. Multilevel uncovertebral hypertrophy and facet arthrosis. No appreciable high-grade spinal canal stenosis. No significant bony neural foraminal narrowing. Upper chest: No consolidation within the imaged lung apices. No visible pneumothorax. Biapical pleuroparenchymal scarring. Other: Chronic fracture deformity of the distal left clavicle. IMPRESSION: CT head: 1.  No evidence of an acute intracranial abnormality. 2. Forehead hematoma. 3. Mild parenchymal atrophy and chronic small vessel ischemic disease. 4. Mild left sphenoid sinusitis. 5. Trace right mastoid effusion. CT cervical spine: 1. Mildly motion degraded exam. 2. No evidence of an acute cervical spine fracture. 3. Mild grade 1 anterolisthesis at C7-T1 and T1-T2. 4. Dextrocurvature of the cervical spine and partially imaged levocurvature of the upper thoracic spine. 5. Cervical spondylosis as described. Electronically Signed   By: Jackey Loge D.O.   On: 10/28/2023 12:56   CT Cervical Spine Wo Contrast  Result Date: 10/28/2023 CLINICAL DATA:  Provided history: Neck trauma. Head trauma, minor. Additional history provided: Fall (with head trauma). Right forehead ecchymosis. EXAM: CT HEAD WITHOUT CONTRAST CT CERVICAL SPINE WITHOUT CONTRAST TECHNIQUE: Multidetector CT imaging of the head and cervical spine was performed following the standard protocol without intravenous contrast. Multiplanar CT image reconstructions of the cervical spine were also generated. RADIATION DOSE REDUCTION: This exam was performed according to the departmental dose-optimization program which includes automated exposure control, adjustment of the mA  and/or kV according to patient size and/or use of iterative reconstruction technique. COMPARISON:  Head CT 05/18/2023.  Cervical spine CT 04/17/2023. FINDINGS: CT HEAD FINDINGS Brain: Mild generalized parenchymal atrophy. Patchy and ill-defined hypoattenuation within the cerebral white matter, nonspecific but compatible with mild chronic small vessel ischemic disease. There is no acute intracranial hemorrhage. No demarcated cortical infarct. No extra-axial fluid collection. No evidence of an intracranial mass. No midline shift. Vascular: No hyperdense vessel. Atherosclerotic calcifications. Skull: No calvarial fracture or aggressive osseous lesion.  Sinuses/Orbits: No acute orbital finding. Prior right ocular lens replacement. Small-volume frothy secretions within the left sphenoid sinus. Other: Forehead hematoma. Trace fluid within the right mastoid air cells. CT CERVICAL SPINE FINDINGS Mildly motion degraded exam. Within this limitation, findings are as follows. Alignment: Slight C7-T1 and T1-T2 grade 1 anterolisthesis. Dextrocurvature of the cervical spine. Levocurvature of the upper thoracic spine, partially imaged. Skull base and vertebrae: The basion-dental and atlanto-dental intervals are maintained.No evidence of acute fracture to the cervical spine. Soft tissues and spinal canal: Subcentimeter thyroid nodules not meeting consensus criteria for ultrasound follow-up based on size. No follow-up imaging recommended. Reference: J Am Coll Radiol. 2015 Feb;12(2): 143-50. Disc levels: Cervical spondylosis. No more than mild disc space narrowing. Shallow multilevel disc bulges/central disc protrusions. Multilevel uncovertebral hypertrophy and facet arthrosis. No appreciable high-grade spinal canal stenosis. No significant bony neural foraminal narrowing. Upper chest: No consolidation within the imaged lung apices. No visible pneumothorax. Biapical pleuroparenchymal scarring. Other: Chronic fracture deformity of the  distal left clavicle. IMPRESSION: CT head: 1.  No evidence of an acute intracranial abnormality. 2. Forehead hematoma. 3. Mild parenchymal atrophy and chronic small vessel ischemic disease. 4. Mild left sphenoid sinusitis. 5. Trace right mastoid effusion. CT cervical spine: 1. Mildly motion degraded exam. 2. No evidence of an acute cervical spine fracture. 3. Mild grade 1 anterolisthesis at C7-T1 and T1-T2. 4. Dextrocurvature of the cervical spine and partially imaged levocurvature of the upper thoracic spine. 5. Cervical spondylosis as described. Electronically Signed   By: Jackey Loge D.O.   On: 10/28/2023 12:56    Assessment/Plan There are no diagnoses linked to this encounter.   Family/ staff Communication:   Labs/tests ordered:

## 2023-11-06 ENCOUNTER — Encounter: Payer: Self-pay | Admitting: Student

## 2023-11-07 ENCOUNTER — Telehealth: Payer: Self-pay | Admitting: Student

## 2023-11-07 ENCOUNTER — Other Ambulatory Visit: Payer: Self-pay | Admitting: Adult Health

## 2023-11-07 DIAGNOSIS — Z515 Encounter for palliative care: Secondary | ICD-10-CM

## 2023-11-07 MED ORDER — QUETIAPINE FUMARATE 25 MG PO TABS: 25.0000 mg | ORAL_TABLET | ORAL | Status: DC | PRN

## 2023-11-07 MED ORDER — HALOPERIDOL LACTATE 2 MG/ML PO CONC: 2.0000 mg | Freq: Four times a day (QID) | ORAL | 0 refills | Status: DC | PRN

## 2023-11-07 MED ORDER — LORAZEPAM 2 MG/ML PO CONC: ORAL | 0 refills | Status: DC

## 2023-11-07 NOTE — Telephone Encounter (Signed)
Received call as medical director for facility that patient is having signficant decline and terminal agitation. Hospice MD added seroquel q4 PRN for agitation. Will plan to discontinue other routine medications to maintain comfort.

## 2023-11-27 ENCOUNTER — Encounter: Payer: Self-pay | Admitting: Oncology

## 2023-12-03 DEATH — deceased

## 2023-12-04 NOTE — Telephone Encounter (Signed)
 Created in error

## 2024-01-30 ENCOUNTER — Ambulatory Visit: Payer: BLUE CROSS/BLUE SHIELD | Admitting: Cardiovascular Disease
